# Patient Record
Sex: Male | Born: 1935 | Race: White | Hispanic: No | Marital: Married | Smoking: Former smoker
Health system: Southern US, Community
[De-identification: ages and names within clinical notes are randomized; demographics above are authoritative.]

## PROBLEM LIST (undated history)

## (undated) DIAGNOSIS — T4145XA Adverse effect of unspecified anesthetic, initial encounter: Secondary | ICD-10-CM

## (undated) DIAGNOSIS — I491 Atrial premature depolarization: Secondary | ICD-10-CM

## (undated) DIAGNOSIS — I10 Essential (primary) hypertension: Secondary | ICD-10-CM

## (undated) DIAGNOSIS — M1990 Unspecified osteoarthritis, unspecified site: Secondary | ICD-10-CM

## (undated) DIAGNOSIS — Z87442 Personal history of urinary calculi: Secondary | ICD-10-CM

## (undated) DIAGNOSIS — M069 Rheumatoid arthritis, unspecified: Secondary | ICD-10-CM

## (undated) DIAGNOSIS — T8859XA Other complications of anesthesia, initial encounter: Secondary | ICD-10-CM

## (undated) DIAGNOSIS — E785 Hyperlipidemia, unspecified: Secondary | ICD-10-CM

## (undated) DIAGNOSIS — K5730 Diverticulosis of large intestine without perforation or abscess without bleeding: Secondary | ICD-10-CM

## (undated) DIAGNOSIS — G4733 Obstructive sleep apnea (adult) (pediatric): Secondary | ICD-10-CM

## (undated) DIAGNOSIS — Z9989 Dependence on other enabling machines and devices: Secondary | ICD-10-CM

## (undated) DIAGNOSIS — Z973 Presence of spectacles and contact lenses: Secondary | ICD-10-CM

## (undated) DIAGNOSIS — Z974 Presence of external hearing-aid: Secondary | ICD-10-CM

## (undated) DIAGNOSIS — Z9889 Other specified postprocedural states: Secondary | ICD-10-CM

## (undated) DIAGNOSIS — I480 Paroxysmal atrial fibrillation: Secondary | ICD-10-CM

## (undated) DIAGNOSIS — Z860100 Personal history of colon polyps, unspecified: Secondary | ICD-10-CM

## (undated) DIAGNOSIS — N1330 Unspecified hydronephrosis: Secondary | ICD-10-CM

## (undated) DIAGNOSIS — I2510 Atherosclerotic heart disease of native coronary artery without angina pectoris: Secondary | ICD-10-CM

## (undated) DIAGNOSIS — J329 Chronic sinusitis, unspecified: Secondary | ICD-10-CM

## (undated) DIAGNOSIS — Z872 Personal history of diseases of the skin and subcutaneous tissue: Secondary | ICD-10-CM

## (undated) DIAGNOSIS — K219 Gastro-esophageal reflux disease without esophagitis: Secondary | ICD-10-CM

## (undated) DIAGNOSIS — Z859 Personal history of malignant neoplasm, unspecified: Secondary | ICD-10-CM

## (undated) HISTORY — DX: Hyperlipidemia, unspecified: E78.5

## (undated) HISTORY — PX: TONSILLECTOMY: SUR1361

## (undated) HISTORY — PX: COLONOSCOPY: SHX174

## (undated) HISTORY — PX: CORONARY ANGIOPLASTY: SHX604

## (undated) HISTORY — DX: Essential (primary) hypertension: I10

## (undated) HISTORY — DX: Unspecified osteoarthritis, unspecified site: M19.90

## (undated) HISTORY — DX: Atrial premature depolarization: I49.1

## (undated) HISTORY — PX: MOHS SURGERY: SUR867

## (undated) HISTORY — DX: Gastro-esophageal reflux disease without esophagitis: K21.9

## (undated) HISTORY — DX: Rheumatoid arthritis, unspecified: M06.9

## (undated) HISTORY — PX: BACK SURGERY: SHX140

## (undated) HISTORY — DX: Chronic sinusitis, unspecified: J32.9

## (undated) HISTORY — DX: Paroxysmal atrial fibrillation: I48.0

---

## 2003-02-16 ENCOUNTER — Encounter: Payer: Self-pay | Admitting: Internal Medicine

## 2003-02-16 ENCOUNTER — Encounter: Admission: RE | Admit: 2003-02-16 | Discharge: 2003-02-16 | Payer: Self-pay | Admitting: Internal Medicine

## 2003-03-25 ENCOUNTER — Encounter: Payer: Self-pay | Admitting: Neurosurgery

## 2003-03-25 ENCOUNTER — Encounter: Admission: RE | Admit: 2003-03-25 | Discharge: 2003-03-25 | Payer: Self-pay | Admitting: Neurosurgery

## 2003-03-25 ENCOUNTER — Encounter: Payer: Self-pay | Admitting: Diagnostic Radiology

## 2003-04-08 ENCOUNTER — Encounter: Payer: Self-pay | Admitting: Neurosurgery

## 2003-04-08 ENCOUNTER — Encounter: Admission: RE | Admit: 2003-04-08 | Discharge: 2003-04-08 | Payer: Self-pay | Admitting: Neurosurgery

## 2003-06-05 ENCOUNTER — Encounter: Payer: Self-pay | Admitting: Neurosurgery

## 2003-06-05 ENCOUNTER — Encounter: Admission: RE | Admit: 2003-06-05 | Discharge: 2003-06-05 | Payer: Self-pay | Admitting: Neurosurgery

## 2003-08-28 ENCOUNTER — Encounter: Payer: Self-pay | Admitting: Neurosurgery

## 2003-08-28 ENCOUNTER — Ambulatory Visit (HOSPITAL_COMMUNITY): Admission: RE | Admit: 2003-08-28 | Discharge: 2003-08-28 | Payer: Self-pay | Admitting: Neurosurgery

## 2003-10-01 ENCOUNTER — Inpatient Hospital Stay (HOSPITAL_COMMUNITY): Admission: RE | Admit: 2003-10-01 | Discharge: 2003-10-05 | Payer: Self-pay | Admitting: Neurosurgery

## 2003-10-01 HISTORY — PX: POSTERIOR LUMBAR FUSION: SHX6036

## 2003-12-31 ENCOUNTER — Ambulatory Visit (HOSPITAL_COMMUNITY): Admission: RE | Admit: 2003-12-31 | Discharge: 2003-12-31 | Payer: Self-pay | Admitting: Orthopedic Surgery

## 2003-12-31 ENCOUNTER — Ambulatory Visit (HOSPITAL_BASED_OUTPATIENT_CLINIC_OR_DEPARTMENT_OTHER): Admission: RE | Admit: 2003-12-31 | Discharge: 2003-12-31 | Payer: Self-pay | Admitting: Orthopedic Surgery

## 2003-12-31 ENCOUNTER — Encounter (INDEPENDENT_AMBULATORY_CARE_PROVIDER_SITE_OTHER): Payer: Self-pay | Admitting: *Deleted

## 2004-09-22 ENCOUNTER — Ambulatory Visit: Payer: Self-pay | Admitting: Gastroenterology

## 2004-10-01 ENCOUNTER — Ambulatory Visit: Payer: Self-pay | Admitting: Internal Medicine

## 2004-10-02 ENCOUNTER — Ambulatory Visit: Payer: Self-pay | Admitting: Gastroenterology

## 2004-10-05 ENCOUNTER — Ambulatory Visit: Payer: Self-pay | Admitting: Gastroenterology

## 2004-10-27 ENCOUNTER — Ambulatory Visit: Payer: Self-pay | Admitting: Internal Medicine

## 2005-02-01 ENCOUNTER — Ambulatory Visit: Payer: Self-pay | Admitting: Internal Medicine

## 2005-09-16 ENCOUNTER — Ambulatory Visit: Payer: Self-pay | Admitting: Internal Medicine

## 2005-10-14 ENCOUNTER — Ambulatory Visit: Payer: Self-pay | Admitting: Internal Medicine

## 2005-11-12 ENCOUNTER — Ambulatory Visit: Payer: Self-pay | Admitting: Internal Medicine

## 2005-11-18 ENCOUNTER — Ambulatory Visit: Payer: Self-pay | Admitting: Internal Medicine

## 2006-02-22 ENCOUNTER — Ambulatory Visit: Payer: Self-pay | Admitting: Internal Medicine

## 2006-02-23 ENCOUNTER — Ambulatory Visit: Payer: Self-pay | Admitting: Gastroenterology

## 2006-03-29 ENCOUNTER — Ambulatory Visit: Payer: Self-pay | Admitting: Internal Medicine

## 2006-10-11 ENCOUNTER — Ambulatory Visit: Payer: Self-pay | Admitting: Internal Medicine

## 2006-11-22 ENCOUNTER — Ambulatory Visit: Payer: Self-pay | Admitting: Internal Medicine

## 2007-01-17 ENCOUNTER — Ambulatory Visit: Payer: Self-pay | Admitting: Endocrinology

## 2007-05-22 ENCOUNTER — Ambulatory Visit: Payer: Self-pay | Admitting: Internal Medicine

## 2007-05-22 LAB — CONVERTED CEMR LAB
ALT: 21 units/L (ref 0–53)
AST: 28 units/L (ref 0–37)
Albumin: 3.7 g/dL (ref 3.5–5.2)
Alkaline Phosphatase: 50 units/L (ref 39–117)
BUN: 13 mg/dL (ref 6–23)
Basophils Absolute: 0 10*3/uL (ref 0.0–0.1)
Basophils Relative: 0.5 % (ref 0.0–1.0)
Bilirubin Urine: NEGATIVE
Bilirubin, Direct: 0.1 mg/dL (ref 0.0–0.3)
CO2: 31 meq/L (ref 19–32)
Calcium: 8.9 mg/dL (ref 8.4–10.5)
Chloride: 114 meq/L — ABNORMAL HIGH (ref 96–112)
Cholesterol: 250 mg/dL (ref 0–200)
Creatinine, Ser: 0.8 mg/dL (ref 0.4–1.5)
Direct LDL: 163 mg/dL
Eosinophils Absolute: 0.1 10*3/uL (ref 0.0–0.6)
Eosinophils Relative: 1.6 % (ref 0.0–5.0)
GFR calc Af Amer: 123 mL/min
GFR calc non Af Amer: 101 mL/min
Glucose, Bld: 108 mg/dL — ABNORMAL HIGH (ref 70–99)
HCT: 40 % (ref 39.0–52.0)
HDL: 46.1 mg/dL (ref >39.0)
Hemoglobin, Urine: NEGATIVE
Hemoglobin: 13.7 g/dL (ref 13.0–17.0)
Ketones, ur: NEGATIVE mg/dL
Leukocytes, UA: NEGATIVE
Lymphocytes Relative: 40.1 % (ref 12.0–46.0)
MCHC: 34.2 g/dL (ref 30.0–36.0)
MCV: 97.4 fL (ref 78.0–100.0)
Monocytes Absolute: 0.4 10*3/uL (ref 0.2–0.7)
Monocytes Relative: 10.7 % (ref 3.0–11.0)
Neutro Abs: 1.8 10*3/uL (ref 1.4–7.7)
Neutrophils Relative %: 47.1 % (ref 43.0–77.0)
Nitrite: NEGATIVE
PSA: 1.07 ng/mL (ref 0.10–4.00)
Platelets: 307 10*3/uL (ref 150–400)
Potassium: 3.9 meq/L (ref 3.5–5.1)
RBC: 4.1 M/uL — ABNORMAL LOW (ref 4.22–5.81)
RDW: 12.1 % (ref 11.5–14.6)
Sodium: 146 meq/L — ABNORMAL HIGH (ref 135–145)
Specific Gravity, Urine: 1.025 (ref 1.000–1.03)
TSH: 0.72 microintl units/mL (ref 0.35–5.50)
Total Bilirubin: 0.9 mg/dL (ref 0.3–1.2)
Total CHOL/HDL Ratio: 5.4
Total Protein, Urine: NEGATIVE mg/dL
Total Protein: 6.8 g/dL (ref 6.0–8.3)
Triglycerides: 145 mg/dL (ref 0–149)
Urine Glucose: NEGATIVE mg/dL
Urobilinogen, UA: 0.2 (ref 0.0–1.0)
VLDL: 29 mg/dL (ref 0–40)
WBC: 3.8 10*3/uL — ABNORMAL LOW (ref 4.5–10.5)
pH: 6 (ref 5.0–8.0)

## 2007-05-26 ENCOUNTER — Ambulatory Visit: Payer: Self-pay | Admitting: Internal Medicine

## 2007-07-27 ENCOUNTER — Encounter: Payer: Self-pay | Admitting: Endocrinology

## 2007-07-27 DIAGNOSIS — Z860100 Personal history of colon polyps, unspecified: Secondary | ICD-10-CM | POA: Insufficient documentation

## 2007-07-27 DIAGNOSIS — M545 Low back pain, unspecified: Secondary | ICD-10-CM | POA: Insufficient documentation

## 2007-08-28 ENCOUNTER — Ambulatory Visit: Payer: Self-pay | Admitting: Internal Medicine

## 2007-09-04 ENCOUNTER — Ambulatory Visit: Payer: Self-pay | Admitting: Internal Medicine

## 2007-09-19 ENCOUNTER — Ambulatory Visit: Payer: Self-pay | Admitting: Internal Medicine

## 2007-09-20 LAB — CONVERTED CEMR LAB
ALT: 24 units/L (ref 0–53)
AST: 25 units/L (ref 0–37)
Cholesterol: 268 mg/dL (ref 0–200)
Direct LDL: 181.8 mg/dL
HDL: 64.4 mg/dL (ref >39.0)
Total CHOL/HDL Ratio: 4.2
Triglycerides: 95 mg/dL (ref 0–149)
VLDL: 19 mg/dL (ref 0–40)

## 2007-09-21 ENCOUNTER — Ambulatory Visit: Payer: Self-pay | Admitting: Internal Medicine

## 2007-09-21 LAB — CONVERTED CEMR LAB: Vit D, 1,25-Dihydroxy: 39 (ref 30–89)

## 2007-10-05 ENCOUNTER — Ambulatory Visit: Payer: Self-pay | Admitting: Internal Medicine

## 2007-10-05 ENCOUNTER — Encounter: Payer: Self-pay | Admitting: Internal Medicine

## 2007-10-23 ENCOUNTER — Encounter: Payer: Self-pay | Admitting: Internal Medicine

## 2007-10-31 ENCOUNTER — Ambulatory Visit: Payer: Self-pay | Admitting: Internal Medicine

## 2008-02-02 ENCOUNTER — Ambulatory Visit: Payer: Self-pay | Admitting: Internal Medicine

## 2008-02-04 LAB — CONVERTED CEMR LAB
ALT: 23 units/L (ref 0–53)
AST: 26 units/L (ref 0–37)
Albumin: 3.8 g/dL (ref 3.5–5.2)
Alkaline Phosphatase: 53 units/L (ref 39–117)
BUN: 14 mg/dL (ref 6–23)
Bilirubin, Direct: 0.1 mg/dL (ref 0.0–0.3)
CO2: 31 meq/L (ref 19–32)
Calcium: 9.3 mg/dL (ref 8.4–10.5)
Chloride: 106 meq/L (ref 96–112)
Cholesterol: 227 mg/dL (ref 0–200)
Creatinine, Ser: 0.8 mg/dL (ref 0.4–1.5)
Direct LDL: 152 mg/dL
GFR calc Af Amer: 122 mL/min
GFR calc non Af Amer: 101 mL/min
Glucose, Bld: 92 mg/dL (ref 70–99)
HDL: 56.9 mg/dL (ref >39.0)
Potassium: 4 meq/L (ref 3.5–5.1)
Sodium: 141 meq/L (ref 135–145)
Total Bilirubin: 0.8 mg/dL (ref 0.3–1.2)
Total CHOL/HDL Ratio: 4
Total Protein: 6.9 g/dL (ref 6.0–8.3)
Triglycerides: 108 mg/dL (ref 0–149)
VLDL: 22 mg/dL (ref 0–40)

## 2008-02-06 ENCOUNTER — Ambulatory Visit: Payer: Self-pay | Admitting: Internal Medicine

## 2008-03-15 ENCOUNTER — Encounter: Payer: Self-pay | Admitting: Internal Medicine

## 2008-05-22 ENCOUNTER — Telehealth: Payer: Self-pay | Admitting: Internal Medicine

## 2008-05-23 ENCOUNTER — Encounter (INDEPENDENT_AMBULATORY_CARE_PROVIDER_SITE_OTHER): Payer: Self-pay | Admitting: *Deleted

## 2008-05-30 ENCOUNTER — Ambulatory Visit: Payer: Self-pay | Admitting: Internal Medicine

## 2008-05-30 LAB — CONVERTED CEMR LAB
ALT: 24 units/L (ref 0–53)
AST: 28 units/L (ref 0–37)
Albumin: 3.8 g/dL (ref 3.5–5.2)
Alkaline Phosphatase: 56 units/L (ref 39–117)
Bilirubin, Direct: 0.1 mg/dL (ref 0.0–0.3)
Cholesterol: 205 mg/dL (ref 0–200)
Direct LDL: 130.6 mg/dL
HDL: 43.8 mg/dL (ref >39.0)
Total Bilirubin: 0.9 mg/dL (ref 0.3–1.2)
Total CHOL/HDL Ratio: 4.7
Total Protein: 7 g/dL (ref 6.0–8.3)
Triglycerides: 132 mg/dL (ref 0–149)
VLDL: 26 mg/dL (ref 0–40)

## 2008-06-11 ENCOUNTER — Ambulatory Visit: Payer: Self-pay | Admitting: Internal Medicine

## 2008-06-11 DIAGNOSIS — R5381 Other malaise: Secondary | ICD-10-CM | POA: Insufficient documentation

## 2008-06-11 DIAGNOSIS — M1990 Unspecified osteoarthritis, unspecified site: Secondary | ICD-10-CM | POA: Insufficient documentation

## 2008-06-11 DIAGNOSIS — J309 Allergic rhinitis, unspecified: Secondary | ICD-10-CM | POA: Insufficient documentation

## 2008-06-11 DIAGNOSIS — R5383 Other fatigue: Secondary | ICD-10-CM | POA: Insufficient documentation

## 2008-06-14 ENCOUNTER — Ambulatory Visit: Payer: Self-pay | Admitting: Cardiology

## 2008-06-14 ENCOUNTER — Telehealth: Payer: Self-pay | Admitting: Internal Medicine

## 2008-06-14 LAB — CONVERTED CEMR LAB
BUN: 17 mg/dL (ref 6–23)
Basophils Absolute: 0 10*3/uL (ref 0.0–0.1)
Basophils Relative: 0.2 % (ref 0.0–3.0)
CO2: 28 meq/L (ref 19–32)
Calcium: 9.5 mg/dL (ref 8.4–10.5)
Chloride: 105 meq/L (ref 96–112)
Creatinine, Ser: 0.8 mg/dL (ref 0.4–1.5)
Eosinophils Absolute: 0.1 10*3/uL (ref 0.0–0.7)
Eosinophils Relative: 1.3 % (ref 0.0–5.0)
GFR calc Af Amer: 122 mL/min
GFR calc non Af Amer: 101 mL/min
Glucose, Bld: 99 mg/dL (ref 70–99)
HCT: 40.7 % (ref 39.0–52.0)
Hemoglobin: 14.1 g/dL (ref 13.0–17.0)
Lymphocytes Relative: 39.4 % (ref 12.0–46.0)
MCHC: 34.7 g/dL (ref 30.0–36.0)
MCV: 98.6 fL (ref 78.0–100.0)
Monocytes Absolute: 0.5 10*3/uL (ref 0.1–1.0)
Monocytes Relative: 10.4 % (ref 3.0–12.0)
Neutro Abs: 2.1 10*3/uL (ref 1.4–7.7)
Neutrophils Relative %: 48.7 % (ref 43.0–77.0)
Platelets: 285 10*3/uL (ref 150–400)
Potassium: 4 meq/L (ref 3.5–5.1)
RBC: 4.13 M/uL — ABNORMAL LOW (ref 4.22–5.81)
RDW: 12.5 % (ref 11.5–14.6)
Sodium: 139 meq/L (ref 135–145)
TSH: 0.6 microintl units/mL (ref 0.35–5.50)
WBC: 4.4 10*3/uL — ABNORMAL LOW (ref 4.5–10.5)

## 2008-06-25 ENCOUNTER — Encounter: Payer: Self-pay | Admitting: Cardiology

## 2008-06-25 ENCOUNTER — Ambulatory Visit: Payer: Self-pay

## 2008-07-09 ENCOUNTER — Ambulatory Visit: Payer: Self-pay | Admitting: Cardiology

## 2008-07-09 LAB — CONVERTED CEMR LAB
CK-MB: 2.5 ng/mL (ref 0.3–4.0)
Total CK: 105 units/L (ref 7–195)

## 2008-10-14 ENCOUNTER — Ambulatory Visit: Payer: Self-pay | Admitting: Internal Medicine

## 2008-10-15 ENCOUNTER — Ambulatory Visit: Payer: Self-pay | Admitting: Internal Medicine

## 2008-10-18 LAB — CONVERTED CEMR LAB
BUN: 17 mg/dL (ref 6–23)
Basophils Absolute: 0 10*3/uL (ref 0.0–0.1)
Basophils Relative: 0.1 % (ref 0.0–3.0)
CO2: 30 meq/L (ref 19–32)
Calcium: 9.3 mg/dL (ref 8.4–10.5)
Chloride: 108 meq/L (ref 96–112)
Cholesterol: 199 mg/dL (ref 0–200)
Creatinine, Ser: 0.8 mg/dL (ref 0.4–1.5)
Eosinophils Absolute: 0.1 10*3/uL (ref 0.0–0.7)
Eosinophils Relative: 1.7 % (ref 0.0–5.0)
GFR calc Af Amer: 122 mL/min
GFR calc non Af Amer: 101 mL/min
Glucose, Bld: 101 mg/dL — ABNORMAL HIGH (ref 70–99)
HCT: 39.6 % (ref 39.0–52.0)
HDL: 44 mg/dL (ref >39.0)
Hemoglobin: 13.7 g/dL (ref 13.0–17.0)
LDL Cholesterol: 133 mg/dL — ABNORMAL HIGH (ref 0–99)
Lymphocytes Relative: 48.2 % — ABNORMAL HIGH (ref 12.0–46.0)
MCHC: 34.6 g/dL (ref 30.0–36.0)
MCV: 97 fL (ref 78.0–100.0)
Monocytes Absolute: 0.4 10*3/uL (ref 0.1–1.0)
Monocytes Relative: 9.5 % (ref 3.0–12.0)
Neutro Abs: 1.6 10*3/uL (ref 1.4–7.7)
Neutrophils Relative %: 40.5 % — ABNORMAL LOW (ref 43.0–77.0)
Platelets: 269 10*3/uL (ref 150–400)
Potassium: 3.9 meq/L (ref 3.5–5.1)
RBC: 4.08 M/uL — ABNORMAL LOW (ref 4.22–5.81)
RDW: 12 % (ref 11.5–14.6)
Sodium: 144 meq/L (ref 135–145)
TSH: 0.62 microintl units/mL (ref 0.35–5.50)
Total CHOL/HDL Ratio: 4.5
Triglycerides: 109 mg/dL (ref 0–149)
VLDL: 22 mg/dL (ref 0–40)
WBC: 3.9 10*3/uL — ABNORMAL LOW (ref 4.5–10.5)

## 2009-05-12 ENCOUNTER — Telehealth: Payer: Self-pay | Admitting: Internal Medicine

## 2009-05-12 ENCOUNTER — Encounter: Payer: Self-pay | Admitting: Internal Medicine

## 2009-05-13 ENCOUNTER — Ambulatory Visit: Payer: Self-pay | Admitting: Internal Medicine

## 2009-05-13 LAB — CONVERTED CEMR LAB
ALT: 25 units/L (ref 0–53)
AST: 29 units/L (ref 0–37)
Albumin: 4 g/dL (ref 3.5–5.2)
Alkaline Phosphatase: 52 units/L (ref 39–117)
BUN: 16 mg/dL (ref 6–23)
Basophils Absolute: 0 10*3/uL (ref 0.0–0.1)
Basophils Relative: 0.5 % (ref 0.0–3.0)
Bilirubin Urine: NEGATIVE
Bilirubin, Direct: 0.2 mg/dL (ref 0.0–0.3)
CO2: 30 meq/L (ref 19–32)
Calcium: 9.4 mg/dL (ref 8.4–10.5)
Chloride: 107 meq/L (ref 96–112)
Cholesterol: 180 mg/dL (ref 0–200)
Creatinine, Ser: 0.8 mg/dL (ref 0.4–1.5)
Eosinophils Absolute: 0 10*3/uL (ref 0.0–0.7)
Eosinophils Relative: 1 % (ref 0.0–5.0)
GFR calc non Af Amer: 100.51 mL/min (ref >60)
Glucose, Bld: 104 mg/dL — ABNORMAL HIGH (ref 70–99)
HCT: 39.9 % (ref 39.0–52.0)
HDL: 52.1 mg/dL (ref >39.00)
Hemoglobin, Urine: NEGATIVE
Hemoglobin: 13.7 g/dL (ref 13.0–17.0)
Ketones, ur: NEGATIVE mg/dL
LDL Cholesterol: 112 mg/dL — ABNORMAL HIGH (ref 0–99)
Leukocytes, UA: NEGATIVE
Lymphocytes Relative: 42.6 % (ref 12.0–46.0)
Lymphs Abs: 1.7 10*3/uL (ref 0.7–4.0)
MCHC: 34.3 g/dL (ref 30.0–36.0)
MCV: 97.5 fL (ref 78.0–100.0)
Monocytes Absolute: 0.4 10*3/uL (ref 0.1–1.0)
Monocytes Relative: 9.4 % (ref 3.0–12.0)
Neutro Abs: 2 10*3/uL (ref 1.4–7.7)
Neutrophils Relative %: 46.5 % (ref 43.0–77.0)
Nitrite: NEGATIVE
PSA: 1.14 ng/mL (ref 0.10–4.00)
Platelets: 259 10*3/uL (ref 150.0–400.0)
Potassium: 4.1 meq/L (ref 3.5–5.1)
RBC: 4.09 M/uL — ABNORMAL LOW (ref 4.22–5.81)
RDW: 12.4 % (ref 11.5–14.6)
Sodium: 142 meq/L (ref 135–145)
Specific Gravity, Urine: 1.02 (ref 1.000–1.030)
TSH: 0.77 microintl units/mL (ref 0.35–5.50)
Total Bilirubin: 1.1 mg/dL (ref 0.3–1.2)
Total CHOL/HDL Ratio: 3
Total Protein, Urine: NEGATIVE mg/dL
Total Protein: 7.2 g/dL (ref 6.0–8.3)
Triglycerides: 79 mg/dL (ref 0.0–149.0)
Urine Glucose: NEGATIVE mg/dL
Urobilinogen, UA: 0.2 (ref 0.0–1.0)
VLDL: 15.8 mg/dL (ref 0.0–40.0)
WBC: 4.1 10*3/uL — ABNORMAL LOW (ref 4.5–10.5)
pH: 6 (ref 5.0–8.0)

## 2009-08-21 ENCOUNTER — Ambulatory Visit: Payer: Self-pay | Admitting: Internal Medicine

## 2009-08-21 DIAGNOSIS — Z87891 Personal history of nicotine dependence: Secondary | ICD-10-CM | POA: Insufficient documentation

## 2009-08-21 DIAGNOSIS — IMO0001 Reserved for inherently not codable concepts without codable children: Secondary | ICD-10-CM | POA: Insufficient documentation

## 2009-09-25 ENCOUNTER — Encounter: Admission: RE | Admit: 2009-09-25 | Discharge: 2009-09-25 | Payer: Self-pay | Admitting: Orthopedic Surgery

## 2009-09-30 ENCOUNTER — Ambulatory Visit (HOSPITAL_BASED_OUTPATIENT_CLINIC_OR_DEPARTMENT_OTHER): Admission: RE | Admit: 2009-09-30 | Discharge: 2009-09-30 | Payer: Self-pay | Admitting: Orthopedic Surgery

## 2009-09-30 ENCOUNTER — Encounter (INDEPENDENT_AMBULATORY_CARE_PROVIDER_SITE_OTHER): Payer: Self-pay | Admitting: Orthopedic Surgery

## 2009-09-30 HISTORY — PX: DUPUYTREN CONTRACTURE RELEASE: SHX1478

## 2009-10-20 ENCOUNTER — Encounter: Payer: Self-pay | Admitting: Internal Medicine

## 2009-11-21 ENCOUNTER — Ambulatory Visit: Payer: Self-pay | Admitting: Internal Medicine

## 2009-11-21 LAB — CONVERTED CEMR LAB
ALT: 18 units/L (ref 0–53)
AST: 19 units/L (ref 0–37)
Albumin: 3.9 g/dL (ref 3.5–5.2)
Alkaline Phosphatase: 50 units/L (ref 39–117)
BUN: 20 mg/dL (ref 6–23)
Bilirubin, Direct: 0.1 mg/dL (ref 0.0–0.3)
CO2: 29 meq/L (ref 19–32)
Calcium: 9.2 mg/dL (ref 8.4–10.5)
Chloride: 109 meq/L (ref 96–112)
Cholesterol: 246 mg/dL — ABNORMAL HIGH (ref 0–200)
Creatinine, Ser: 0.9 mg/dL (ref 0.4–1.5)
Direct LDL: 166.5 mg/dL
GFR calc non Af Amer: 87.61 mL/min (ref >60)
Glucose, Bld: 92 mg/dL (ref 70–99)
HDL: 44.4 mg/dL (ref >39.00)
Potassium: 4.1 meq/L (ref 3.5–5.1)
Sodium: 143 meq/L (ref 135–145)
Total Bilirubin: 1.1 mg/dL (ref 0.3–1.2)
Total CHOL/HDL Ratio: 6
Total CK: 133 units/L (ref 7–232)
Total Protein: 7.3 g/dL (ref 6.0–8.3)
Triglycerides: 133 mg/dL (ref 0.0–149.0)
VLDL: 26.6 mg/dL (ref 0.0–40.0)

## 2009-11-27 ENCOUNTER — Ambulatory Visit: Payer: Self-pay | Admitting: Internal Medicine

## 2009-11-27 DIAGNOSIS — M2550 Pain in unspecified joint: Secondary | ICD-10-CM | POA: Insufficient documentation

## 2009-12-08 ENCOUNTER — Encounter: Payer: Self-pay | Admitting: Internal Medicine

## 2010-03-30 ENCOUNTER — Encounter: Payer: Self-pay | Admitting: Internal Medicine

## 2010-04-30 ENCOUNTER — Telehealth: Payer: Self-pay | Admitting: Internal Medicine

## 2010-05-08 ENCOUNTER — Encounter: Payer: Self-pay | Admitting: Internal Medicine

## 2010-05-25 ENCOUNTER — Encounter: Payer: Self-pay | Admitting: Internal Medicine

## 2010-06-04 ENCOUNTER — Ambulatory Visit: Payer: Self-pay | Admitting: Internal Medicine

## 2010-06-04 DIAGNOSIS — R209 Unspecified disturbances of skin sensation: Secondary | ICD-10-CM | POA: Insufficient documentation

## 2010-06-04 DIAGNOSIS — M069 Rheumatoid arthritis, unspecified: Secondary | ICD-10-CM | POA: Insufficient documentation

## 2010-06-23 ENCOUNTER — Encounter: Payer: Self-pay | Admitting: Internal Medicine

## 2010-07-05 ENCOUNTER — Ambulatory Visit: Payer: Self-pay | Admitting: Internal Medicine

## 2010-07-05 ENCOUNTER — Emergency Department (HOSPITAL_COMMUNITY): Admission: EM | Admit: 2010-07-05 | Discharge: 2010-07-05 | Payer: Self-pay | Admitting: Emergency Medicine

## 2010-07-07 ENCOUNTER — Telehealth: Payer: Self-pay | Admitting: Internal Medicine

## 2010-07-07 DIAGNOSIS — R002 Palpitations: Secondary | ICD-10-CM | POA: Insufficient documentation

## 2010-07-15 ENCOUNTER — Telehealth: Payer: Self-pay | Admitting: Internal Medicine

## 2010-07-16 ENCOUNTER — Ambulatory Visit: Payer: Self-pay

## 2010-07-16 ENCOUNTER — Ambulatory Visit: Payer: Self-pay | Admitting: Cardiology

## 2010-07-16 ENCOUNTER — Ambulatory Visit (HOSPITAL_COMMUNITY): Admission: RE | Admit: 2010-07-16 | Discharge: 2010-07-16 | Payer: Self-pay | Admitting: Internal Medicine

## 2010-07-16 ENCOUNTER — Encounter: Payer: Self-pay | Admitting: Internal Medicine

## 2010-07-16 ENCOUNTER — Ambulatory Visit: Payer: Self-pay | Admitting: Internal Medicine

## 2010-07-17 ENCOUNTER — Encounter: Payer: Self-pay | Admitting: Internal Medicine

## 2010-07-22 ENCOUNTER — Ambulatory Visit: Payer: Self-pay | Admitting: Internal Medicine

## 2010-07-23 ENCOUNTER — Ambulatory Visit: Payer: Self-pay | Admitting: Internal Medicine

## 2010-07-23 DIAGNOSIS — E782 Mixed hyperlipidemia: Secondary | ICD-10-CM | POA: Insufficient documentation

## 2010-08-24 ENCOUNTER — Encounter: Payer: Self-pay | Admitting: Internal Medicine

## 2010-09-22 ENCOUNTER — Ambulatory Visit: Payer: Self-pay | Admitting: Internal Medicine

## 2010-12-08 ENCOUNTER — Ambulatory Visit
Admission: RE | Admit: 2010-12-08 | Discharge: 2010-12-08 | Payer: Self-pay | Source: Home / Self Care | Attending: Internal Medicine | Admitting: Internal Medicine

## 2010-12-08 ENCOUNTER — Other Ambulatory Visit: Payer: Self-pay | Admitting: Internal Medicine

## 2010-12-08 LAB — CBC WITH DIFFERENTIAL/PLATELET
Basophils Absolute: 0 10*3/uL (ref 0.0–0.1)
Basophils Relative: 0.8 % (ref 0.0–3.0)
Eosinophils Absolute: 0.1 10*3/uL (ref 0.0–0.7)
Eosinophils Relative: 1.8 % (ref 0.0–5.0)
HCT: 41.4 % (ref 39.0–52.0)
Hemoglobin: 14.1 g/dL (ref 13.0–17.0)
Lymphocytes Relative: 44.8 % (ref 12.0–46.0)
Lymphs Abs: 1.9 10*3/uL (ref 0.7–4.0)
MCHC: 34 g/dL (ref 30.0–36.0)
MCV: 96.7 fl (ref 78.0–100.0)
Monocytes Absolute: 0.5 10*3/uL (ref 0.1–1.0)
Monocytes Relative: 11.1 % (ref 3.0–12.0)
Neutro Abs: 1.8 10*3/uL (ref 1.4–7.7)
Neutrophils Relative %: 41.5 % — ABNORMAL LOW (ref 43.0–77.0)
Platelets: 254 10*3/uL (ref 150.0–400.0)
RBC: 4.28 Mil/uL (ref 4.22–5.81)
RDW: 13.6 % (ref 11.5–14.6)
WBC: 4.3 10*3/uL — ABNORMAL LOW (ref 4.5–10.5)

## 2010-12-08 LAB — URINALYSIS
Bilirubin Urine: NEGATIVE
Ketones, ur: NEGATIVE
Leukocytes, UA: NEGATIVE
Nitrite: NEGATIVE
Specific Gravity, Urine: 1.025 (ref 1.000–1.030)
Total Protein, Urine: NEGATIVE
Urine Glucose: NEGATIVE
Urobilinogen, UA: 0.2 (ref 0.0–1.0)
pH: 5.5 (ref 5.0–8.0)

## 2010-12-08 LAB — LDL CHOLESTEROL, DIRECT: Direct LDL: 158.8 mg/dL

## 2010-12-08 LAB — HEPATIC FUNCTION PANEL
ALT: 15 U/L (ref 0–53)
AST: 18 U/L (ref 0–37)
Albumin: 3.7 g/dL (ref 3.5–5.2)
Alkaline Phosphatase: 51 U/L (ref 39–117)
Bilirubin, Direct: 0.1 mg/dL (ref 0.0–0.3)
Total Bilirubin: 0.7 mg/dL (ref 0.3–1.2)
Total Protein: 7 g/dL (ref 6.0–8.3)

## 2010-12-08 LAB — BASIC METABOLIC PANEL
BUN: 12 mg/dL (ref 6–23)
CO2: 31 mEq/L (ref 19–32)
Calcium: 9.1 mg/dL (ref 8.4–10.5)
Chloride: 108 mEq/L (ref 96–112)
Creatinine, Ser: 0.7 mg/dL (ref 0.4–1.5)
GFR: 109.5 mL/min (ref >60.00)
Glucose, Bld: 93 mg/dL (ref 70–99)
Potassium: 4.1 mEq/L (ref 3.5–5.1)
Sodium: 144 mEq/L (ref 135–145)

## 2010-12-08 LAB — LIPID PANEL
Cholesterol: 240 mg/dL — ABNORMAL HIGH (ref 0–200)
HDL: 62 mg/dL (ref >39.00)
Total CHOL/HDL Ratio: 4
Triglycerides: 122 mg/dL (ref 0.0–149.0)
VLDL: 24.4 mg/dL (ref 0.0–40.0)

## 2010-12-08 LAB — VITAMIN B12: Vitamin B-12: 727 pg/mL (ref 211–911)

## 2010-12-08 LAB — TSH: TSH: 0.54 u[IU]/mL (ref 0.35–5.50)

## 2010-12-08 LAB — HEMOGLOBIN A1C: Hgb A1c MFr Bld: 6.3 % (ref 4.6–6.5)

## 2010-12-13 LAB — CONVERTED CEMR LAB
ALT: 23 units/L (ref 0–53)
AST: 20 units/L (ref 0–37)
Albumin: 4 g/dL (ref 3.5–5.2)
Alkaline Phosphatase: 70 units/L (ref 39–117)
BUN: 20 mg/dL (ref 6–23)
Basophils Absolute: 0 10*3/uL (ref 0.0–0.1)
Basophils Relative: 0.3 % (ref 0.0–3.0)
Bilirubin Urine: NEGATIVE
Bilirubin, Direct: 0.2 mg/dL (ref 0.0–0.3)
CO2: 30 meq/L (ref 19–32)
Calcium: 9.7 mg/dL (ref 8.4–10.5)
Chloride: 109 meq/L (ref 96–112)
Cholesterol: 221 mg/dL — ABNORMAL HIGH (ref 0–200)
Creatinine, Ser: 0.8 mg/dL (ref 0.4–1.5)
Direct LDL: 148.2 mg/dL
Eosinophils Absolute: 0 10*3/uL (ref 0.0–0.7)
Eosinophils Relative: 0.2 % (ref 0.0–5.0)
GFR calc non Af Amer: 107.97 mL/min (ref >60)
Glucose, Bld: 123 mg/dL — ABNORMAL HIGH (ref 70–99)
HCT: 39.8 % (ref 39.0–52.0)
HDL: 54.8 mg/dL (ref >39.00)
Hemoglobin: 13.7 g/dL (ref 13.0–17.0)
Ketones, ur: NEGATIVE mg/dL
Leukocytes, UA: NEGATIVE
Lymphocytes Relative: 16 % (ref 12.0–46.0)
Lymphs Abs: 1.3 10*3/uL (ref 0.7–4.0)
MCHC: 34.3 g/dL (ref 30.0–36.0)
MCV: 94.1 fL (ref 78.0–100.0)
Monocytes Absolute: 0.4 10*3/uL (ref 0.1–1.0)
Monocytes Relative: 4.9 % (ref 3.0–12.0)
Neutro Abs: 6.5 10*3/uL (ref 1.4–7.7)
Neutrophils Relative %: 78.6 % — ABNORMAL HIGH (ref 43.0–77.0)
Nitrite: NEGATIVE
PSA: 1.84 ng/mL (ref 0.10–4.00)
Platelets: 356 10*3/uL (ref 150.0–400.0)
Potassium: 4.7 meq/L (ref 3.5–5.1)
RBC: 4.23 M/uL (ref 4.22–5.81)
RDW: 16.3 % — ABNORMAL HIGH (ref 11.5–14.6)
Sodium: 147 meq/L — ABNORMAL HIGH (ref 135–145)
Specific Gravity, Urine: >=1.03 (ref 1.000–1.030)
TSH: 0.34 microintl units/mL — ABNORMAL LOW (ref 0.35–5.50)
Total Bilirubin: 1 mg/dL (ref 0.3–1.2)
Total CHOL/HDL Ratio: 4
Total Protein, Urine: NEGATIVE mg/dL
Total Protein: 7.5 g/dL (ref 6.0–8.3)
Triglycerides: 77 mg/dL (ref 0.0–149.0)
Urine Glucose: NEGATIVE mg/dL
Urobilinogen, UA: 0.2 (ref 0.0–1.0)
VLDL: 15.4 mg/dL (ref 0.0–40.0)
Vitamin B-12: 407 pg/mL (ref 211–911)
WBC: 8.3 10*3/uL (ref 4.5–10.5)
pH: 5.5 (ref 5.0–8.0)

## 2010-12-17 NOTE — Assessment & Plan Note (Signed)
Summary: YEARLY--$50---STC   Vital Signs:  Patient profile:   77 year old male Height:      72 inches Weight:      178 pounds BMI:     24.23 Temp:     98 degrees F oral BP sitting:   124 / 76  (left arm)  Vitals Entered By: Tora Perches (May 13, 2009 10:57 AM) CC: CPX Is Patient Diabetic? No   CC:  CPX.  History of Present Illness: The patient presents for a wellness examination    Current Medications (verified): 1)  Lorazepam 0.5 Mg Tabs (Lorazepam) .[name].. 1 - 2 Two Times A Day Prn 2)  Adult Aspirin Low Strength 81 Mg  Tbdp (Aspirin) .... Take 1 By Mouth Qd 3)  Zolpidem Tartrate 10 Mg  Tabs (Zolpidem Tartrate) .... At Bedtime As Needed 4)  Omeprazole 20 Mg Cpdr (Omeprazole) .... One By Mouth Daily 5)  Darvocet-N 100 100-650 Mg  Tabs (Propoxyphene N-Apap) .... Take 1-2 Q 4 Hours Prn 6)  Fish Oil   Oil (Fish Oil) .... 2 By Mouth Bid 7)  Vitamin D 1000 Unit  Caps (Cholecalciferol) .... Take 1 By Mouth Qd 8)  Pravastatin Sodium 40 Mg  Tabs (Pravastatin Sodium) .... 2  By Mouth At Bedtime 9)  Aciphex 20 Mg Tbec (Rabeprazole Sodium) .[name].. 1 Tablet By Mouth Once Daily  Allergies: 1)  ! Lipitor 2)  ! * Crestor  Past History:  Past Surgical History: Last updated: 07/27/2007 Back Surgery (09/2003) EGD (10/05/2004)  Family History: Last updated: 10/31/2007 Family History Hypertension  Social History: Last updated: 10/31/2007 Retired Married  Past Medical History: Colonic polyps, hx of Hyperlipidemia Low back pain Minimal plaque in carotids Allergic rhinitis Osteoarthritis GI Dr Marina Goodell  Review of Systems  The patient denies fever, chest pain, and abdominal pain.    Physical Exam  General:  Well-developed,well-nourished,in no acute distress; alert,appropriate and cooperative throughout examination Eyes:  No corneal or conjunctival inflammation noted. EOMI. Perrla. Funduscopic exam benign, without hemorrhages, exudates or papilledema. Vision grossly  normal. Nose:  External nasal examination shows no deformity or inflammation. Nasal mucosa are pink and moist without lesions or exudates. Mouth:  Oral mucosa and oropharynx without lesions or exudates.  Teeth in good repair. Neck:  No deformities, masses, or tenderness noted. Lungs:  Normal respiratory effort, chest expands symmetrically. Lungs are clear to auscultation, no crackles or wheezes. Heart:  Normal rate and regular rhythm. S1 and S2 normal without gallop, murmur, click, rub or other extra sounds. Abdomen:  Bowel sounds positive,abdomen soft and non-tender without masses, organomegaly or hernias noted. Msk:  No deformity or scoliosis noted of thoracic or lumbar spine.  R Depuetren contracture Extremities:  No clubbing, cyanosis, edema, or deformity noted with normal full range of motion of all joints.   Neurologic:  No cranial nerve deficits noted. Station and gait are normal. Plantar reflexes are down-going bilaterally. DTRs are symmetrical throughout. Sensory, motor and coordinative functions appear intact. Skin:  Intact without suspicious lesions or rashes Psych:  Cognition and judgment appear intact. Alert and cooperative with normal attention span and concentration. No apparent delusions, illusions, hallucinations   Impression & Recommendations:  Problem # 1:  PHYSICAL EXAMINATION (ICD-V70.0) Assessment Comment Only  Orders: EKG w/ Interpretation (93000)  Problem # 2:  OSTEOARTHRITIS (ICD-715.90) Assessment: Unchanged  His updated medication list for this problem includes:    Adult Aspirin Low Strength 81 Mg Tbdp (Aspirin) .[name]... Take 1 by mouth qd    Darvocet-n  100 100-650 Mg Tabs (Propoxyphene n-apap) .[name]... Take 1-2 q 4 hours prn  Problem # 3:  LOW BACK PAIN (ICD-724.2) Assessment: Improved  His updated medication list for this problem includes:    Adult Aspirin Low Strength 81 Mg Tbdp (Aspirin) .[name]... Take 1 by mouth qd    Darvocet-n 100 100-650 Mg Tabs  (Propoxyphene n-apap) .[name]... Take 1-2 q 4 hours prn  Problem # 4:  ABNORMAL GLUCOSE NEC (ICD-790.29) Assessment: Comment Only  Problem # 5:  HYPERLIPIDEMIA (ICD-272.4) Assessment: Comment Only  His updated medication list for this problem includes:    Pravastatin Sodium 40 Mg Tabs (Pravastatin sodium) .[name]... 2  by mouth at bedtime  Complete Medication List: 1)  Lorazepam 0.5 Mg Tabs (Lorazepam) .[name].. 1 - 2 two times a day prn 2)  Adult Aspirin Low Strength 81 Mg Tbdp (Aspirin) .... Take 1 by mouth qd 3)  Zolpidem Tartrate 10 Mg Tabs (Zolpidem tartrate) .... At bedtime as needed 4)  Darvocet-n 100 100-650 Mg Tabs (Propoxyphene n-apap) .... Take 1-2 q 4 hours prn 5)  Fish Oil Oil (Fish oil) .... 2 by mouth bid 6)  Vitamin D 1000 Unit Caps (Cholecalciferol) .... Take 1 by mouth qd 7)  Pravastatin Sodium 40 Mg Tabs (Pravastatin sodium) .... 2  by mouth at bedtime 8)  Omeprazole 20 Mg Cpdr (Omeprazole) .... One by mouth daily 9)  Viagra 100 Mg Tabs (Sildenafil citrate) .[name].. 1 by mouth once daily prn  Patient Instructions: 1)  Please schedule a follow-up appointment in 6 months. 2)  Start a yoga class 3)  Try to eat more raw plant food, dry fruit, raw almonds, leafy vegies, whole foods and less meat, animal fat. Avoid processed foods, (canned soups, hot dogs, sausage , frozen dinners), animal fat, red meat.  4)  BMP prior to visit, ICD-9: 272.0 5)  Hepatic Panel prior to visit, ICD-9: 6)  Lipid Panel prior to visit, ICD-9: Prescriptions: VIAGRA 100 MG TABS (SILDENAFIL CITRATE) 1 by mouth once daily prn  #12 x 12   Entered and Authorized by:   Tresa Garter MD   Signed by:   Tresa Garter MD on 05/13/2009   Method used:   Print then Give to Patient   RxID:   [id number] OMEPRAZOLE 20 MG CPDR (OMEPRAZOLE) one by mouth daily  #90 x 3   Entered and Authorized by:   Tresa Garter MD   Signed by:   Tresa Garter MD on 05/13/2009   Method used:   Print then Give to  Patient   RxID:   [id number]

## 2010-12-17 NOTE — Progress Notes (Signed)
Summary: MED REFILL  Medications Added ACIPHEX 20 MG TBEC (RABEPRAZOLE SODIUM) 1 tablet by mouth once daily       Phone Note Call from Patient Call back at Work Phone [phone]   Caller: PT Call For: Lorraine Terriquez Reason for Call: Refill Medication Details for Reason: MED REFILL ACIPHEX Summary of Call: NEEDS 3 MO SUPPLY TO MAIL. Initial call taken by: Darryl Lent,  May 12, 2009 10:06 AM    New/Updated Medications: ACIPHEX 20 MG TBEC (RABEPRAZOLE SODIUM) 1 tablet by mouth once daily   Prescriptions: ACIPHEX 20 MG TBEC (RABEPRAZOLE SODIUM) 1 tablet by mouth once daily  #90 x 3   Entered by:   Milford Cage CMA   Authorized by:   Hilarie Fredrickson MD   Signed by:   Milford Cage CMA on 05/12/2009   Method used:   Print then Give to Patient   RxID:   [id number]   Appended Document: MED REFILL called patient to inform the Rx. for Aciphex is available to pick up at front desk.  BS.

## 2010-12-17 NOTE — Letter (Signed)
Summary: [facility]   Imported By: Lennie Odor 09/23/2010 10:57:42  _____________________________________________________________________  External Attachment:    Type:   Image     Comment:   External Document

## 2010-12-17 NOTE — Progress Notes (Signed)
Summary: REFERRAL  Phone Note Call from Patient Call back at Home Phone [phone]   Caller: wife [phone] Summary of Call: Pt saw Dr Corinda Gubler for allergies, Dr suggested that he see a cardiologist. He is req a referral  Wife called back, concerned and wants pt to see cardio asap ....................Marland KitchenLamar Todd  May 23, 2008 9:45 AM  Initial call taken by: [name],  May 22, 2008 5:35 PM  Follow-up for Phone Call        OK Follow-up by: Tresa Garter MD,  May 23, 2008 1:04 PM  Additional Follow-up for Phone Call Additional follow up Details #1::        Pt spoke with Dr Corinda Gubler, they made apt for cardiology for july, pt will cancel apt for august Additional Follow-up by: [name],  May 23, 2008 1:30 PM         Appended Document: REFERRAL Dr, Corinda Gubler has already call cardiology and sch pt to see Dr. Juanda Chance on 7/31

## 2010-12-17 NOTE — Progress Notes (Signed)
Summary: REFILL/Plot pt  Phone Note Refill Request   Refills Requested: Medication #1:  LORAZEPAM 0.5 MG TABS 1 - 2 two times a day prn Initial call taken by: Lamar Sprinkles,  June 14, 2008 7:55 AM  Follow-up for Phone Call        ok to refill lorazepam Follow-up by: Jacques Navy MD,  June 14, 2008 8:28 AM      Prescriptions: LORAZEPAM 0.5 MG TABS (LORAZEPAM) 1 - 2 two times a day prn  #60 x 2   Entered by:   Lamar Sprinkles   Authorized by:   Tresa Garter MD   Signed by:   Lamar Sprinkles on 06/14/2008   Method used:   Faxed to ...       [facility]       [address]       [place], [place]  [address]       Ph: [phone] or [phone]       Fax: [phone]   RxID:   [id number] LORAZEPAM 0.5 MG TABS (LORAZEPAM) 1 - 2 two times a day prn  #60 x 2   Entered and Authorized by:   Corwin Levins MD   Signed by:   Corwin Levins MD on 06/14/2008   Method used:   Print then Give to Patient   RxID:   [id number]

## 2010-12-17 NOTE — Procedures (Signed)
Summary: Summary Report  Summary Report   Imported By: Erle Crocker 07/29/2010 12:55:28  _____________________________________________________________________  External Attachment:    Type:   Image     Comment:   External Document

## 2010-12-17 NOTE — Assessment & Plan Note (Signed)
Summary: FU ON CHLORESTEROL/NWS  Medications Added PRAVACHOL 20 MG TABS (PRAVASTATIN SODIUM) 1 po qd FISH OIL   OIL (FISH OIL) 2 by mouth bid        Vital Signs:  Patient Profile:   77 Years Old Male Weight:      184 pounds Temp:     97.4 degrees F oral Pulse rate:   76 / minute BP sitting:   141 / 85  (left arm)  Vitals Entered By: Tora Perches (October 31, 2007 4:06 PM)             Is Patient Diabetic? No     Chief Complaint:  Multiple medical problems or concerns.  Current Allergies: ! LIPITOR ! * CRESTOR  Past Medical History:    Reviewed history from 07/27/2007 and no changes required:       Colonic polyps, hx of       Hyperlipidemia       Low back pain   Family History:    Family History Hypertension  Social History:    Retired    Married   Risk Factors:  Tobacco use:  quit   Review of Systems  The patient denies chest pain.     Physical Exam  General:     Well-developed,well-nourished,in no acute distress; alert,appropriate and cooperative throughout examination Lungs:     Normal respiratory effort, chest expands symmetrically. Lungs are clear to auscultation, no crackles or wheezes. Heart:     Normal rate and regular rhythm. S1 and S2 normal without gallop, murmur, click, rub or other extra sounds. Abdomen:     Bowel sounds positive,abdomen soft and non-tender without masses, organomegaly or hernias noted. Msk:     No deformity or scoliosis noted of thoracic or lumbar spine.   Neurologic:     alert & oriented X3.      Impression & Recommendations:  Problem # 1:  HYPERLIPIDEMIA (ICD-272.4)  The following medications were removed from the medication list:    Lovaza 1 Gm Caps (Omega-3-acid ethyl esters) .[name]... Take 2 by mouth two times a day qd  His updated medication list for this problem includes:    Pravachol 20 Mg Tabs (Pravastatin sodium) .[name]... 1 po once daily was started today. D/c if problems   The following medications  were removed from the medication list:    Lovaza 1 Gm Caps (Omega-3-acid ethyl esters) .[name]... Take 2 by mouth two times a day qd  His updated medication list for this problem includes:    Pravachol 20 Mg Tabs (Pravastatin sodium) .[name]... 1 po qd   Problem # 2:  LOW BACK PAIN (ICD-724.2) Assessment: Unchanged  His updated medication list for this problem includes:    Darvocet-n 100 100-650 Mg Tabs (Propoxyphene n-apap) .[name]... Take 1-2 q 4 hours prn    Adult Aspirin Low Strength 81 Mg Tbdp (Aspirin) .[name]... Take 1 by mouth qd   Complete Medication List: 1)  Pravachol 20 Mg Tabs (Pravastatin sodium) .[name].. 1 po qd 2)  Aciphex 20 Mg Tbec (Rabeprazole sodium) .... Take 1 by mouth qd 3)  Darvocet-n 100 100-650 Mg Tabs (Propoxyphene n-apap) .... Take 1-2 q 4 hours prn 4)  Lorazepam 0.5 Mg Tabs (Lorazepam) .[name].. 1 - 2 two times a day prn 5)  Fish Oil Oil (Fish oil) .... 2 by mouth bid 6)  Adult Aspirin Low Strength 81 Mg Tbdp (Aspirin) .... Take 1 by mouth qd 7)  Vitamin D 1000 Unit Caps (Cholecalciferol) .... Take 1  by mouth qd   Patient Instructions: 1)  Please schedule a follow-up appointment in 3 months. 2)  BMP prior to visit, ICD-9: 3)  Hepatic Panel prior to visit, ICD-9: 272.0 995.2 4)  Lipid Panel prior to visit, ICD-9:    Prescriptions: ACIPHEX 20 MG  TBEC (RABEPRAZOLE SODIUM) take 1 by mouth qd  #90 x 3   Entered and Authorized by:   Tresa Garter MD   Signed by:   Tresa Garter MD on 10/31/2007   Method used:   Print then Give to Patient   RxID:   [id number] PRAVACHOL 20 MG TABS (PRAVASTATIN SODIUM) 1 po qd  #30 x 12   Entered and Authorized by:   Tresa Garter MD   Signed by:   Tresa Garter MD on 10/31/2007   Method used:   Print then Give to Patient   RxID:   [id number]  ]

## 2010-12-17 NOTE — Progress Notes (Signed)
Summary: Refill--Zolpidem  Phone Note Refill Request Message from:  Fax from Pharmacy on April 30, 2010 2:26 PM  Refills Requested: Medication #1:  ZOLPIDEM TARTRATE 10 MG  TABS 1 at bedtime as needed Next Appointment Scheduled: 06-04-10 Initial call taken by: Lucious Groves,  April 30, 2010 2:27 PM  Follow-up for Phone Call        ok 6 ref Follow-up by: Tresa Garter MD,  May 01, 2010 8:05 AM    Prescriptions: ZOLPIDEM TARTRATE 10 MG  TABS (ZOLPIDEM TARTRATE) 1 at bedtime as needed  #90 x 1   Entered by:   Lamar Sprinkles, CMA   Authorized by:   Tresa Garter MD   Signed by:   Lamar Sprinkles, CMA on 05/01/2010   Method used:   Telephoned to ...       [facility] (retail)       [address]       [place], [place]  [address]       Ph: [phone] or [phone]       Fax: [phone]   RxID:   [id number]

## 2010-12-17 NOTE — Assessment & Plan Note (Signed)
Summary: 4 MO ROA/AWARE OF FEE/NML  APPT TIME IS 8:45/NWS   Vital Signs:  Patient Profile:   77 Years Old Male Weight:      182 pounds Temp:     97.2 degrees F oral Pulse rate:   80 / minute BP sitting:   139 / 94  (left arm)  Vitals Entered By: Tora Perches (June 11, 2008 11:41 AM)                 Chief Complaint:  Multiple medical problems or concerns.  History of Present Illness: The patient presents for a follow up of hypertension, hyperlipidemia.   C/o fatigue 2-3 mo. In the past he would see Dr Nira Retort and get a steroid shot for fatigue that he thought was due to  allergies and  feel better. Last time he saw him he didn't give him a shot and send him to see Dr Juanda Chance to check his heart. He felt SOB while hiking in [place].   No CP, no SOB here.     Current Allergies (reviewed today): ! LIPITOR ! * CRESTOR  Past Medical History:    Reviewed history from 07/27/2007 and no changes required:       Colonic polyps, hx of       Hyperlipidemia       Low back pain       Minimal plaque in carotids       Allergic rhinitis       Osteoarthritis  Past Surgical History:    Reviewed history from 07/27/2007 and no changes required:       Back Surgery (09/2003)       EGD (10/05/2004)   Family History:    Reviewed history from 10/31/2007 and no changes required:       Family History Hypertension  Social History:    Reviewed history from 10/31/2007 and no changes required:       Retired       Married     Review of Systems  The patient denies fever, weight gain, chest pain, syncope, dyspnea on exertion, peripheral edema, and abdominal pain.     Physical Exam  General:     Well-developed,well-nourished,in no acute distress; alert,appropriate and cooperative throughout examination Eyes:     No corneal or conjunctival inflammation noted. EOMI. Perrla. Funduscopic exam benign, without hemorrhages, exudates or papilledema. Vision grossly normal. Nose:     External  nasal examination shows no deformity or inflammation. Nasal mucosa are pink and moist without lesions or exudates. Mouth:     Oral mucosa and oropharynx without lesions or exudates.  Teeth in good repair. Neck:     No deformities, masses, or tenderness noted. Lungs:     Normal respiratory effort, chest expands symmetrically. Lungs are clear to auscultation, no crackles or wheezes. Heart:     Normal rate and regular rhythm. S1 and S2 normal without gallop, murmur, click, rub or other extra sounds. Abdomen:     Bowel sounds positive,abdomen soft and non-tender without masses, organomegaly or hernias noted. Msk:     No deformity or scoliosis noted of thoracic or lumbar spine.   Extremities:     No clubbing, cyanosis, edema, or deformity noted with normal full range of motion of all joints.   Neurologic:     No cranial nerve deficits noted. Station and gait are normal. Plantar reflexes are down-going bilaterally. DTRs are symmetrical throughout. Sensory, motor and coordinative functions appear intact. Skin:     Intact  without suspicious lesions or rashes Psych:     Cognition and judgment appear intact. Alert and cooperative with normal attention span and concentration. No apparent delusions, illusions, hallucinations    Impression & Recommendations:  Problem # 1:  FATIGUE (ICD-780.79) Assessment: Comment Only Recurrent, unclear etiology. The only new Rx is Pravachol. He can stop x 1-2 mo and see. He can try DHEA OTC  Problem # 2:  HYPERLIPIDEMIA (ICD-272.4) Assessment: Improved  His updated medication list for this problem includes:    Pravachol 20 Mg Tabs (Pravastatin sodium) .[name]... 2 po qd   Problem # 3:  LOW BACK PAIN (ICD-724.2) Assessment: Unchanged  His updated medication list for this problem includes:    Adult Aspirin Low Strength 81 Mg Tbdp (Aspirin) .[name]... Take 1 by mouth qd    Darvocet-n 100 100-650 Mg Tabs (Propoxyphene n-apap) .[name]... Take 1-2 q 4 hours prn   Problem  # 4:  ABNORMAL GLUCOSE NEC (ICD-790.29) Watching Labs Reviewed: Creat: 0.8 (02/02/2008)      Complete Medication List: 1)  Pravachol 20 Mg Tabs (Pravastatin sodium) .... 2 po qd 2)  Lorazepam 0.5 Mg Tabs (Lorazepam) .[name].. 1 - 2 two times a day prn 3)  Adult Aspirin Low Strength 81 Mg Tbdp (Aspirin) .... Take 1 by mouth qd 4)  Vitamin D 1000 Unit Caps (Cholecalciferol) .... Take 1 by mouth qd 5)  Zolpidem Tartrate 10 Mg Tabs (Zolpidem tartrate) .... At bedtime as needed 6)  Omeprazole 20 Mg Cpdr (Omeprazole) .... One by mouth daily 7)  Fish Oil Oil (Fish oil) .... 2 by mouth bid 8)  Darvocet-n 100 100-650 Mg Tabs (Propoxyphene n-apap) .... Take 1-2 q 4 hours prn   Patient Instructions: 1)  Can try DHEA 50 mg 1 a day 2)  Please schedule a follow-up appointment in 4 months.   Prescriptions: OMEPRAZOLE 20 MG CPDR (OMEPRAZOLE) one by mouth daily  #90 x 3   Entered and Authorized by:   Tresa Garter MD   Signed by:   Tresa Garter MD on 06/13/2008   Method used:   Electronically sent to ...       [facility]       [address]       [place], [place]  [address]       Ph: [phone] or [phone]       Fax: [phone]   RxID:   [id number]  ]

## 2010-12-17 NOTE — Assessment & Plan Note (Signed)
Summary: 4 MO ROV /NWS $50   Vital Signs:  Patient Profile:   77 Years Old Male Weight:      184 pounds Temp:     97.9 degrees F oral Pulse rate:   72 / minute BP sitting:   144 / 80  (left arm)  Vitals Entered By: Tora Perches (October 15, 2008 9:15 AM)                 Chief Complaint:  Multiple medical problems or concerns.    Current Allergies (reviewed today): ! LIPITOR ! * CRESTOR  Past Medical History:    Reviewed history from 06/11/2008 and no changes required:       Colonic polyps, hx of       Hyperlipidemia       Low back pain       Minimal plaque in carotids       Allergic rhinitis       Osteoarthritis   Family History:    Reviewed history from 10/31/2007 and no changes required:       Family History Hypertension  Social History:    Reviewed history from 10/31/2007 and no changes required:       Retired       Married     Physical Exam  General:     Well-developed,well-nourished,in no acute distress; alert,appropriate and cooperative throughout examination Ears:     External ear exam shows no significant lesions or deformities.  Otoscopic examination reveals clear canals, tympanic membranes are intact bilaterally without bulging, retraction, inflammation or discharge. Hearing is grossly normal bilaterally. Mouth:     Oral mucosa and oropharynx without lesions or exudates.  Teeth in good repair. Neck:     No deformities, masses, or tenderness noted. Lungs:     Normal respiratory effort, chest expands symmetrically. Lungs are clear to auscultation, no crackles or wheezes. Heart:     Normal rate and regular rhythm. S1 and S2 normal without gallop, murmur, click, rub or other extra sounds. Abdomen:     Bowel sounds positive,abdomen soft and non-tender without masses, organomegaly or hernias noted. Msk:     No deformity or scoliosis noted of thoracic or lumbar spine.      Impression & Recommendations:  Problem # 1:  HYPERLIPIDEMIA  (ICD-272.4) Assessment: Improved  His updated medication list for this problem includes:    Pravachol 20 Mg Tabs (Pravastatin sodium) .[name]... 2 po once daily seems to tol. it OK: increase to 80 mg qd  His updated medication list for this problem includes:    Pravastatin Sodium 40 Mg Tabs (Pravastatin sodium) .[name]... 2  by mouth at bedtime  The following medications were removed from the medication list:    Pravachol 20 Mg Tabs (Pravastatin sodium) .[name]... 2 po qd  His updated medication list for this problem includes:    Pravastatin Sodium 40 Mg Tabs (Pravastatin sodium) .[name]... 2  by mouth at bedtime   Problem # 2:  LOW BACK PAIN (ICD-724.2) Assessment: Unchanged  His updated medication list for this problem includes:    Adult Aspirin Low Strength 81 Mg Tbdp (Aspirin) .[name]... Take 1 by mouth qd    Darvocet-n 100 100-650 Mg Tabs (Propoxyphene n-apap) .[name]... Take 1-2 q 4 hours prn   Complete Medication List: 1)  Lorazepam 0.5 Mg Tabs (Lorazepam) .[name].. 1 - 2 two times a day prn 2)  Adult Aspirin Low Strength 81 Mg Tbdp (Aspirin) .... Take 1 by mouth qd 3)  Zolpidem Tartrate 10 Mg Tabs (Zolpidem tartrate) .... At bedtime as needed 4)  Omeprazole 20 Mg Cpdr (Omeprazole) .... One by mouth daily 5)  Darvocet-n 100 100-650 Mg Tabs (Propoxyphene n-apap) .... Take 1-2 q 4 hours prn 6)  Fish Oil Oil (Fish oil) .... 2 by mouth bid 7)  Vitamin D 1000 Unit Caps (Cholecalciferol) .... Take 1 by mouth qd 8)  Pravastatin Sodium 40 Mg Tabs (Pravastatin sodium) .... 2  by mouth at bedtime   Patient Instructions: 1)  Please schedule a follow-up appointment in 6 months well. 2)  BMP prior to visit, ICD-9: 3)  Hepatic Panel prior to visit, ICD-9: 4)  Lipid Panel prior to visit, ICD-9: 5)  TSH prior to visit, ICD-9: 6)  CBC w/ Diff prior to visit, ICD-9: v70.0  272.0  995.20 7)  Urine-dip prior to visit, ICD-9: 8)  PSA prior to visit, ICD-9:     Prescriptions: PRAVASTATIN SODIUM 40 MG  TABS  (PRAVASTATIN SODIUM) 2  by mouth at bedtime  #90 x 3   Entered and Authorized by:   Tresa Garter MD   Signed by:   Tresa Garter MD on 10/18/2008   Method used:   Print then Give to Patient   RxID:   [id number]  ]

## 2010-12-17 NOTE — Letter (Signed)
Summary: [facility] Consult Scheduled Letter  [facility]  [address] [place], [place] [address]   Phone: [phone]  Fax: [phone]      05/23/2008 MRN: [id number]  [facility] [address] [place], [place]  [address]    Dear [name],      We have scheduled an appointment for you.  At the recommendation of Dr. Posey Rea, we have scheduled you a consult with Dr. Daleen Squibb on June 26, 2008 at 3:00pm.  Their phone number is [phone].  If this appointment day and time is not convenient for you, please feel free to call the office of the doctor you are being referred to at the number listed above and reschedule the appointment.  Dr. [facility] [address] [place], [place] [address]  *Please give 24hr notice to cancel/reschedule your appointment to avoid a $50.00 fee*  Thank you,  Patient Care Coordinator [facility]  Appended Document: [facility] Consult Scheduled Letter cancel letter, Dr. Corinda Gubler called and sch appt for pt to see Dr. Juanda Chance on July 31

## 2010-12-17 NOTE — Progress Notes (Signed)
Summary: Lorazepam RF  Phone Note Refill Request Message from:  Fax from Pharmacy  Refills Requested: Medication #1:  LORAZEPAM 0.5 MG TABS 1 - 2 two times a day prn   Supply Requested: 60   Last Refilled: 02/19/2010  Method Requested: Telephone to Pharmacy Initial call taken by: Lanier Prude, [facility]),  July 15, 2010 1:21 PM  Follow-up for Phone Call        ok 3 ref Follow-up by: Tresa Garter MD,  July 15, 2010 1:30 PM  Additional Follow-up for Phone Call Additional follow up Details #1::        Rx called to pharmacy Additional Follow-up by: Lanier Prude, [facility]),  July 15, 2010 4:28 PM    Prescriptions: LORAZEPAM 0.5 MG TABS (LORAZEPAM) 1 - 2 two times a day prn  #60 x 3   Entered by:   Lanier Prude, CMA(AAMA)   Authorized by:   Tresa Garter MD   Signed by:   Lanier Prude, CMA(AAMA) on 07/15/2010   Method used:   Telephoned to ...       [facility] (retail)       [address]       [place], [place]  [id number]       Ph: [phone] or [phone]       Fax: [phone]   RxID:   [id number]

## 2010-12-17 NOTE — Assessment & Plan Note (Signed)
Summary: 6 MO ROV /NWS   Vital Signs:  Patient profile:   77 year old male Height:      72 inches Weight:      184 pounds BMI:     25.05 Temp:     98.5 degrees F oral Pulse rate:   80 / minute Pulse rhythm:   regular Resp:     16 per minute BP sitting:   140 / 86  (left arm) Cuff size:   regular  Vitals Entered By: Lanier Prude, CMA(AAMA) (December 08, 2010 7:53 AM) CC: 6 mo f/u  Is Patient Diabetic? No Comments pt is not taking Trexall or Folic acid   Primary Care Provider:  Tresa Garter MD  CC:  6 mo f/u .  History of Present Illness: The patient presents for a follow up of RA, GERD, hyperlipidemia   Current Medications (verified): 1)  Lorazepam 0.5 Mg Tabs (Lorazepam) .[name].. 1 - 2 Two Times A Day Prn 2)  Aspirin 325 Mg Tabs (Aspirin) .... Take 1 By Mouth Once Daily 3)  Zolpidem Tartrate 10 Mg  Tabs (Zolpidem Tartrate) .[name].. 1 At Bedtime As Needed 4)  Fish Oil   Oil (Fish Oil) .... 2 By Mouth Bid 5)  Vitamin D 1000 Unit  Caps (Cholecalciferol) .... Take 1 By Mouth Qd 6)  Omeprazole 20 Mg Cpdr (Omeprazole) .... One By Mouth Daily 7)  Viagra 100 Mg Tabs (Sildenafil Citrate) .[name].. 1 By Mouth Once Daily Prn 8)  Prednisone (Pak) 5 Mg Tabs (Prednisone) .[name].. 1 Once Daily 9)  Fexofenadine Hcl 180 Mg Tabs (Fexofenadine Hcl) .[name].. 1 Once Daily 10)  Trexall 15 Mg Tabs (Methotrexate Sodium) .... Weekly 11)  Folic Acid 1 Mg Tabs (Folic Acid) .... Once Daily 12)  Magnesium Oxide 400 Mg Tabs (Magnesium Oxide) .... 3 Daily 13)  Cyanocobalamin 2500 Mcg Subl (Cyanocobalamin) .... Once Daily 14)  Niaspan 500 Mg Cr-Tabs (Niacin (Antihyperlipidemic)) .... Take 1 Tablet At Bedtime X 4 Weeks Then Increase To 2 Tabs At Bedtime 15)  Red Yeast Rice 600 Mg Caps (Red Yeast Rice Extract) .[name].. 1 By Mouth Once Daily 16)  Arava 20 Mg Tabs (Leflunomide) .[name].. 1 By Mouth Once Daily  Allergies (verified): 1)  ! Lipitor 2)  ! * Crestor 3)  Pravachol (Pravastatin Sodium) 4)  Methotrexate  Past  History:  Past Surgical History: Last updated: 07/27/2007 Back Surgery (09/2003) EGD (10/05/2004)  Social History: Last updated: 10/31/2007 Retired Married  Past Medical History: Colonic polyps, hx of Hyperlipidemia Low back pain Minimal plaque in carotids Allergic rhinitis Osteoarthritis Dr Charlann Boxer GI Dr Marina Goodell Migraine w/aura Statin intolerance PACs RA -  Dr Dareen Piano 2011 [facility] 2011 - one episode due to ? MTX AKs Dr Londell Moh  Family History: Reviewed history from 10/31/2007 and no changes required. Family History Hypertension  Social History: Reviewed history from 10/31/2007 and no changes required. Retired Married  Review of Systems  The patient denies anorexia, dyspnea on exertion, abdominal pain, and depression.    Physical Exam  General:  Well-developed,well-nourished,in no acute distress; alert,appropriate and cooperative throughout examination Head:  Normocephalic and atraumatic without obvious abnormalities. No apparent alopecia or balding. Nose:  External nasal examination shows no deformity or inflammation. Nasal mucosa are pink and moist without lesions or exudates. Mouth:  Oral mucosa and oropharynx without lesions or exudates.  Teeth in good repair. Neck:  No deformities, masses, or tenderness noted. Lungs:  Normal respiratory effort, chest expands symmetrically. Lungs are clear to auscultation, no crackles  or wheezes. Heart:  Normal rate and regular rhythm. S1 and S2 normal without gallop, murmur, click, rub or other extra sounds. Abdomen:  Bowel sounds positive,abdomen soft and non-tender without masses, organomegaly or hernias noted. Msk:  L hip pain Extremities:  No clubbing, cyanosis, edema, or deformity noted with normal full range of motion of all joints.   Neurologic:  No cranial nerve deficits noted. Station and gait are normal. Plantar reflexes are down-going bilaterally. DTRs are symmetrical throughout. Sensory, motor and coordinative functions  appear intact. Skin:  Intact without suspicious lesions or rashes Psych:  Cognition and judgment appear intact. Alert and cooperative with normal attention span and concentration. No apparent delusions, illusions, hallucinations   Impression & Recommendations:  Problem # 1:  MIXED HYPERLIPIDEMIA (ICD-272.2) Assessment Unchanged  His updated medication list for this problem includes:    Niaspan 500 Mg Cr-tabs (Niacin (antihyperlipidemic)) .[name]... Take 2 tabs at bedtime  Orders: TLB-A1C / Hgb A1C (Glycohemoglobin) (83036-A1C) TLB-B12, Serum-Total ONLY (14782-N56) TLB-BMP (Basic Metabolic Panel-BMET) (80048-METABOL) TLB-Hepatic/Liver Function Pnl (80076-HEPATIC) TLB-CBC Platelet - w/Differential (85025-CBCD) TLB-Lipid Panel (80061-LIPID) TLB-TSH (Thyroid Stimulating Hormone) (84443-TSH) TLB-Udip ONLY (81003-UDIP)  Problem # 2:  PALPITATIONS (ICD-785.1) Assessment: Comment Only Resolved  Problem # 3:  ARTHRALGIA (ICD-719.40) Assessment: Improved  Problem # 4:  LOW BACK PAIN (ICD-724.2) Assessment: Improved  His updated medication list for this problem includes:    Aspirin 325 Mg Tabs (Aspirin) .[name]... Take 1 by mouth once daily  Orders: TLB-A1C / Hgb A1C (Glycohemoglobin) (83036-A1C) TLB-B12, Serum-Total ONLY (21308-M57) TLB-BMP (Basic Metabolic Panel-BMET) (80048-METABOL) TLB-Hepatic/Liver Function Pnl (80076-HEPATIC) TLB-CBC Platelet - w/Differential (85025-CBCD) TLB-Lipid Panel (80061-LIPID) TLB-TSH (Thyroid Stimulating Hormone) (84443-TSH) TLB-Udip ONLY (81003-UDIP)  Problem # 5:  RHEUMATOID ARTHRITIS, CHRONIC (ICD-714.0) Assessment: Improved On the regimen of medicine(s) reflected in the chart   He can try Predn 1/2 tab once daily if mood swings  Complete Medication List: 1)  Lorazepam 0.5 Mg Tabs (Lorazepam) .[name].. 1 - 2 two times a day prn 2)  Aspirin 325 Mg Tabs (Aspirin) .... Take 1 by mouth once daily 3)  Zolpidem Tartrate 10 Mg Tabs (Zolpidem tartrate) .[name].. 1 at  bedtime as needed 4)  Fish Oil Oil (Fish oil) .... 2 by mouth bid 5)  Vitamin D 1000 Unit Caps (Cholecalciferol) .... Take 1 by mouth qd 6)  Omeprazole 20 Mg Cpdr (Omeprazole) .... One by mouth daily 7)  Viagra 100 Mg Tabs (Sildenafil citrate) .[name].. 1 by mouth once daily prn 8)  Prednisone (pak) 5 Mg Tabs (Prednisone) .[name].. 1 once daily 9)  Fexofenadine Hcl 180 Mg Tabs (Fexofenadine hcl) .[name].. 1 once daily 10)  Trexall 15 Mg Tabs (Methotrexate sodium) .... Weekly 11)  Folic Acid 1 Mg Tabs (Folic acid) .... Once daily 12)  Magnesium Oxide 400 Mg Tabs (Magnesium oxide) .... 3 daily 13)  Cyanocobalamin 2500 Mcg Subl (Cyanocobalamin) .... Once daily 14)  Niaspan 500 Mg Cr-tabs (Niacin (antihyperlipidemic)) .... Take 1 tablet at bedtime x 4 weeks then increase to 2 tabs at bedtime 15)  Red Yeast Rice 600 Mg Caps (Red yeast rice extract) .[name].. 1 by mouth once daily 16)  Arava 20 Mg Tabs (Leflunomide) .[name].. 1 by mouth once daily  Patient Instructions: 1)  Please schedule a follow-up appointment in 6 months well w/labs.   Orders Added: 1)  Est. Patient Level IV [84696] 2)  TLB-A1C / Hgb A1C (Glycohemoglobin) [83036-A1C] 3)  TLB-B12, Serum-Total ONLY [82607-B12] 4)  TLB-BMP (Basic Metabolic Panel-BMET) [80048-METABOL] 5)  TLB-Hepatic/Liver Function Pnl [80076-HEPATIC]  6)  TLB-CBC Platelet - w/Differential [85025-CBCD] 7)  TLB-Lipid Panel [80061-LIPID] 8)  TLB-TSH (Thyroid Stimulating Hormone) [84443-TSH] 9)  TLB-Udip ONLY [81003-UDIP]

## 2010-12-17 NOTE — Letter (Signed)
Summary: [facility]   Imported By: Lester [place] 07/07/2010 07:29:32  _____________________________________________________________________  External Attachment:    Type:   Image     Comment:   External Document

## 2010-12-17 NOTE — Miscellaneous (Signed)
Summary: zolpidem  Medications Added ZOLPIDEM TARTRATE 10 MG  TABS (ZOLPIDEM TARTRATE) at bedtime as needed       Clinical Lists Changes  Medications: Added new medication of ZOLPIDEM TARTRATE 10 MG  TABS (ZOLPIDEM TARTRATE) at bedtime as needed - Signed Rx of ZOLPIDEM TARTRATE 10 MG  TABS (ZOLPIDEM TARTRATE) at bedtime as needed;  #30 x 6;  Signed;  Entered by: Tora Perches;  Authorized by: Tresa Garter MD;  Method used: Telephoned to [facility] [address] [place], [place]  [id number], Ph: [phone] or [phone], Fax: [phone]    Prescriptions: ZOLPIDEM TARTRATE 10 MG  TABS (ZOLPIDEM TARTRATE) at bedtime as needed  #30 x 6   Entered by:   Tora Perches   Authorized by:   Tresa Garter MD   Signed by:   Tora Perches on 03/15/2008   Method used:   Telephoned to ...       [facility]       [address]       [place], [place]  [id number]       Ph: [phone] or [phone]       Fax: [phone]   RxID:   [id number]

## 2010-12-17 NOTE — Assessment & Plan Note (Signed)
Summary: 3 mos f/u #/cd   Vital Signs:  Patient profile:   77 year old male Weight:      181 pounds Temp:     99 degrees F oral Pulse rate:   75 / minute BP sitting:   116 / 74  (left arm)  Vitals Entered By: Tora Perches (November 27, 2009 9:32 AM) CC: f/u Is Patient Diabetic? No   CC:  f/u.  History of Present Illness: F/u elev. chol, aches and cramps from chol med.  Preventive Screening-Counseling & Management  Alcohol-Tobacco     Smoking Status: quit  Current Medications (verified): 1)  Lorazepam 0.5 Mg Tabs (Lorazepam) .[name].. 1 - 2 Two Times A Day Prn 2)  Adult Aspirin Low Strength 81 Mg  Tbdp (Aspirin) .... Take 1 By Mouth Qd 3)  Zolpidem Tartrate 10 Mg  Tabs (Zolpidem Tartrate) .[name].. 1 At Bedtime As Needed 4)  Fish Oil   Oil (Fish Oil) .... 2 By Mouth Bid 5)  Vitamin D 1000 Unit  Caps (Cholecalciferol) .... Take 1 By Mouth Qd 6)  Omeprazole 20 Mg Cpdr (Omeprazole) .... One By Mouth Daily 7)  Viagra 100 Mg Tabs (Sildenafil Citrate) .[name].. 1 By Mouth Once Daily Prn  Allergies: 1)  ! Lipitor 2)  ! * Crestor 3)  Pravachol (Pravastatin Sodium)  Past History:  Past Surgical History: Last updated: 07/27/2007 Back Surgery (09/2003) EGD (10/05/2004)  Past Medical History: Colonic polyps, hx of Hyperlipidemia Low back pain Minimal plaque in carotids Allergic rhinitis Osteoarthritis GI Dr Marina Goodell Migraine w/aura Statin intolerance  Family History: Reviewed history from 10/31/2007 and no changes required. Family History Hypertension  Social History: Reviewed history from 10/31/2007 and no changes required. Retired Married  Review of Systems  The patient denies fever, weight gain, hemoptysis, abdominal pain, and melena.    Physical Exam  General:  Well-developed,well-nourished,in no acute distress; alert,appropriate and cooperative throughout examination Ears:  External ear exam shows no significant lesions or deformities.  Otoscopic examination reveals  clear canals, tympanic membranes are intact bilaterally without bulging, retraction, inflammation or discharge. Hearing is grossly normal bilaterally. Nose:  External nasal examination shows no deformity or inflammation. Nasal mucosa are pink and moist without lesions or exudates. Mouth:  Oral mucosa and oropharynx without lesions or exudates.  Teeth in good repair. Lungs:  Normal respiratory effort, chest expands symmetrically. Lungs are clear to auscultation, no crackles or wheezes. Heart:  Normal rate and regular rhythm. S1 and S2 normal without gallop, murmur, click, rub or other extra sounds. Abdomen:  Bowel sounds positive,abdomen soft and non-tender without masses, organomegaly or hernias noted. Msk:  No deformity or scoliosis noted of thoracic or lumbar spine.  R Depuetren contracture s/p surgery - better Extremities:  No clubbing, cyanosis, edema, or deformity noted with normal full range of motion of all joints.   Neurologic:  No cranial nerve deficits noted. Station and gait are normal. Plantar reflexes are down-going bilaterally. DTRs are symmetrical throughout. Sensory, motor and coordinative functions appear intact. Skin:  Intact without suspicious lesions or rashes   Impression & Recommendations:  Problem # 1:  ARTHRALGIA (ICD-719.40) off statins - resolved Assessment Improved  Problem # 2:  MYALGIA (ICD-729.1) - as above Assessment: Improved  The following medications were removed from the medication list:    Darvocet-n 100 100-650 Mg Tabs (Propoxyphene n-apap) .[name]... Take 1-2 q 4 hours prn His updated medication list for this problem includes:    Adult Aspirin Low Strength 81 Mg Tbdp (Aspirin) .Marland KitchenMarland KitchenMarland KitchenMarland Kitchen  Take 1 by mouth qd  Problem # 3:  HYPERLIPIDEMIA (ICD-272.4) - the best we can do Assessment: Deteriorated  The following medications were removed from the medication list:    Pravastatin Sodium 40 Mg Tabs (Pravastatin sodium) .[name]...  Problem # 4:  LOW BACK PAIN  (ICD-724.2) Assessment: Improved  The following medications were removed from the medication list:    Darvocet-n 100 100-650 Mg Tabs (Propoxyphene n-apap) .[name]... Take 1-2 q 4 hours prn His updated medication list for this problem includes:    Adult Aspirin Low Strength 81 Mg Tbdp (Aspirin) .[name]... Take 1 by mouth qd  Complete Medication List: 1)  Lorazepam 0.5 Mg Tabs (Lorazepam) .[name].. 1 - 2 two times a day prn 2)  Adult Aspirin Low Strength 81 Mg Tbdp (Aspirin) .... Take 1 by mouth qd 3)  Zolpidem Tartrate 10 Mg Tabs (Zolpidem tartrate) .[name].. 1 at bedtime as needed 4)  Fish Oil Oil (Fish oil) .... 2 by mouth bid 5)  Vitamin D 1000 Unit Caps (Cholecalciferol) .... Take 1 by mouth qd 6)  Omeprazole 20 Mg Cpdr (Omeprazole) .... One by mouth daily 7)  Viagra 100 Mg Tabs (Sildenafil citrate) .[name].. 1 by mouth once daily prn  Patient Instructions: 1)  Please schedule a follow-up appointment in 6 months well w/labs v70.0.

## 2010-12-17 NOTE — Assessment & Plan Note (Signed)
Summary: 3 MO ROA/NML   Vital Signs:  Patient Profile:   77 Years Old Male Weight:      181 pounds Temp:     98.6 degrees F oral Pulse rate:   78 / minute BP sitting:   161 / 101  (left arm)  Vitals Entered By: Tora Perches (February 06, 2008 11:44 AM)             Is Patient Diabetic? No     Chief Complaint:  Multiple medical problems or concerns.  History of Present Illness: The patient presents for a follow up of  hyperlipidemia     Current Allergies (reviewed today): ! LIPITOR ! * CRESTOR  Past Medical History:    Reviewed history from 07/27/2007 and no changes required:       Colonic polyps, hx of       Hyperlipidemia       Low back pain   Family History:    Reviewed history from 10/31/2007 and no changes required:       Family History Hypertension  Social History:    Reviewed history from 10/31/2007 and no changes required:       Retired       Married     Physical Exam  General:     Well-developed,well-nourished,in no acute distress; alert,appropriate and cooperative throughout examination Ears:     External ear exam shows no significant lesions or deformities.  Otoscopic examination reveals clear canals, tympanic membranes are intact bilaterally without bulging, retraction, inflammation or discharge. Hearing is grossly normal bilaterally. Mouth:     Oral mucosa and oropharynx without lesions or exudates.  Teeth in good repair. Lungs:     Normal respiratory effort, chest expands symmetrically. Lungs are clear to auscultation, no crackles or wheezes. Heart:     Normal rate and regular rhythm. S1 and S2 normal without gallop, murmur, click, rub or other extra sounds. Psych:     Oriented X3.      Impression & Recommendations:  Problem # 1:  HYPERLIPIDEMIA (ICD-272.4) Assessment: Improved Will increase Pravachol.His updated medication list for this problem includes:    Pravachol 20 Mg Tabs (Pravastatin sodium) .[name]... 2 po qd  His updated  medication list for this problem includes:    Pravachol 20 Mg Tabs (Pravastatin sodium) .[name]... 2 po qd   Problem # 2:  ABNORMAL GLUCOSE NEC (ICD-790.29) BP is nl at home  Complete Medication List: 1)  Pravachol 20 Mg Tabs (Pravastatin sodium) .... 2 po qd 2)  Aciphex 20 Mg Tbec (Rabeprazole sodium) .... Take 1 by mouth qd 3)  Darvocet-n 100 100-650 Mg Tabs (Propoxyphene n-apap) .... Take 1-2 q 4 hours prn 4)  Lorazepam 0.5 Mg Tabs (Lorazepam) .[name].. 1 - 2 two times a day prn 5)  Fish Oil Oil (Fish oil) .... 2 by mouth bid 6)  Adult Aspirin Low Strength 81 Mg Tbdp (Aspirin) .... Take 1 by mouth qd 7)  Vitamin D 1000 Unit Caps (Cholecalciferol) .... Take 1 by mouth qd   Patient Instructions: 1)  Please schedule a follow-up appointment in 4 months. 2)  Hepatic Panel prior to visit, ICD-9: 3)  Lipid Panel prior to visit, ICD-9:272.0    Prescriptions: PRAVACHOL 20 MG TABS (PRAVASTATIN SODIUM) 2 po qd  #60 x 11   Entered and Authorized by:   Tresa Garter MD   Signed by:   Tresa Garter MD on 02/06/2008   Method used:   Print then Give to Patient  RxID:   [id number]  ]

## 2010-12-17 NOTE — Assessment & Plan Note (Signed)
Summary: flu shot/plot/cd   Nurse Visit   Vital Signs:  Patient profile:   77 year old male Temp:     97.5 degrees F oral  Vitals Entered By: Lanier Prude, [facility]) (September 22, 2010 3:19 PM)  Allergies: 1)  ! Lipitor 2)  ! * Crestor 3)  Pravachol (Pravastatin Sodium)  Orders Added: 1)  Flu Vaccine 79yrs + MEDICARE PATIENTS [Q2039] 2)  Administration Flu vaccine - MCR [G0008]         Flu Vaccine Consent Questions     Do you have a history of severe allergic reactions to this vaccine? no    Any prior history of allergic reactions to egg and/or gelatin? no    Do you have a sensitivity to the preservative Thimersol? no    Do you have a past history of Guillan-Barre Syndrome? no    Do you currently have an acute febrile illness? no    Have you ever had a severe reaction to latex? no    Vaccine information given and explained to patient? yes    Are you currently pregnant? no    Lot Number:[id number]   Exp Date:05/15/2011   Site Given  Right Deltoid IM Lanier Prude, [facility])  September 22, 2010 3:20 PM

## 2010-12-17 NOTE — Assessment & Plan Note (Signed)
Summary: YEARLY FU / MEDICARE/ LABS SAME DAY /NWS  #   Vital Signs:  Patient profile:   77 year old male Height:      72 inches Weight:      176 pounds BMI:     23.96 O2 Sat:      97 % on Room air Temp:     97.9 degrees F oral Pulse rate:   84 / minute Pulse rhythm:   regular Resp:     14 per minute BP sitting:   140 / 90  (left arm) Cuff size:   regular  Vitals Entered By: Lanier Prude, CMA(AAMA) (June 04, 2010 10:32 AM)  O2 Flow:  Room air CC: CPX Is Patient Diabetic? No   CC:  CPX.  History of Present Illness: The patient presents for a wellness examination Patient past medical history, social history, and family history reviewed in detail no significant changes.  Patient is physically active. Depression is negative and mood is good. Hearing is normal, and able to perform activities of daily living. Risk of falling is negligible and home safety has been reviewed and is appropriate. Patient has normal height, weight, and visual acuity. Patient has been counseled on age-appropriate routine health concerns for screening and prevention. Education, counseling done.   He was dx'd w/RA this summer; f/u LBP, stiffness, dyslipidemia, elev glu  Current Medications (verified): 1)  Lorazepam 0.5 Mg Tabs (Lorazepam) .[name].. 1 - 2 Two Times A Day Prn 2)  Adult Aspirin Low Strength 81 Mg  Tbdp (Aspirin) .... Take 1 By Mouth Qd 3)  Zolpidem Tartrate 10 Mg  Tabs (Zolpidem Tartrate) .[name].. 1 At Bedtime As Needed 4)  Fish Oil   Oil (Fish Oil) .... 2 By Mouth Bid 5)  Vitamin D 1000 Unit  Caps (Cholecalciferol) .... Take 1 By Mouth Qd 6)  Omeprazole 20 Mg Cpdr (Omeprazole) .... One By Mouth Daily 7)  Viagra 100 Mg Tabs (Sildenafil Citrate) .[name].. 1 By Mouth Once Daily Prn 8)  Prednisone (Pak) 5 Mg Tabs (Prednisone) .[name].. 1 Once Daily 9)  Fexofenadine Hcl 180 Mg Tabs (Fexofenadine Hcl) .[name].. 1 Once Daily 10)  Trexall 10 Mg Tabs (Methotrexate Sodium) .[name].. 1 Weekly  Allergies (verified): 1)  !  Lipitor 2)  ! * Crestor 3)  Pravachol (Pravastatin Sodium)  Past History:  Past Surgical History: Last updated: 07/27/2007 Back Surgery (09/2003) EGD (10/05/2004)  Family History: Last updated: 10/31/2007 Family History Hypertension  Social History: Last updated: 10/31/2007 Retired Married  Past Medical History: Colonic polyps, hx of Hyperlipidemia Low back pain Minimal plaque in carotids Allergic rhinitis Osteoarthritis Dr Charlann Boxer GI Dr Marina Goodell Migraine w/aura Statin intolerance RA -  Dr Dareen Piano 2011  Review of Systems       The patient complains of weight loss, decreased hearing, and difficulty walking.  The patient denies anorexia, fever, weight gain, vision loss, hoarseness, chest pain, syncope, dyspnea on exertion, peripheral edema, prolonged cough, headaches, hemoptysis, abdominal pain, melena, hematochezia, severe indigestion/heartburn, hematuria, incontinence, genital sores, muscle weakness, suspicious skin lesions, transient blindness, depression, unusual weight change, abnormal bleeding, enlarged lymph nodes, angioedema, and testicular masses.         Better now  Physical Exam  General:  Well-developed,well-nourished,in no acute distress; alert,appropriate and cooperative throughout examination Head:  Normocephalic and atraumatic without obvious abnormalities. No apparent alopecia or balding. Eyes:  No corneal or conjunctival inflammation noted. EOMI. Perrla. Funduscopic exam benign, without hemorrhages, exudates or papilledema. Vision grossly normal. Nose:  External nasal examination  shows no deformity or inflammation. Nasal mucosa are pink and moist without lesions or exudates. Mouth:  Oral mucosa and oropharynx without lesions or exudates.  Teeth in good repair. Neck:  No deformities, masses, or tenderness noted. Lungs:  Normal respiratory effort, chest expands symmetrically. Lungs are clear to auscultation, no crackles or wheezes. Heart:  Normal rate and  regular rhythm. S1 and S2 normal without gallop, murmur, click, rub or other extra sounds. Abdomen:  Bowel sounds positive,abdomen soft and non-tender without masses, organomegaly or hernias noted. Rectal:  No external abnormalities noted. Normal sphincter tone. No rectal masses or tenderness. G(-) Genitalia:  Testes bilaterally descended without nodularity, tenderness or masses. No scrotal masses or lesions. No penis lesions or urethral discharge. Prostate:  1+ enlarged.   Msk:  L hip pain Pulses:  R and L carotid,radial,femoral,dorsalis pedis and posterior tibial pulses are full and equal bilaterally Extremities:  No clubbing, cyanosis, edema, or deformity noted with normal full range of motion of all joints.   Neurologic:  No cranial nerve deficits noted. Station and gait are normal. Plantar reflexes are down-going bilaterally. DTRs are symmetrical throughout. Sensory, motor and coordinative functions appear intact. Skin:  Intact without suspicious lesions or rashes Cervical Nodes:  No lymphadenopathy noted Inguinal Nodes:  No significant adenopathy Psych:  Cognition and judgment appear intact. Alert and cooperative with normal attention span and concentration. No apparent delusions, illusions, hallucinations   Impression & Recommendations:  Problem # 1:  PHYSICAL EXAMINATION (ICD-V70.0) Assessment New  Overall doing well, age appropriate education and counseling updated and referral for appropriate preventive services done unless declined, immunizations up to date or declined, diet counseling done if overweight, urged to quit smoking if smokes, most recent labs reviewed and current ordered if appropriate, ecg reviewed or declined (interpretation per ECG scanned in the EMR if done); information regarding Medicare Preventation requirements given if appropriate.  Get labs Orders: EKG w/ Interpretation (93000) EKG w/ Interpretation (93000) WNL TLB-BMP (Basic Metabolic Panel-BMET)  (80048-METABOL) TLB-CBC Platelet - w/Differential (85025-CBCD) TLB-Hepatic/Liver Function Pnl (80076-HEPATIC) TLB-TSH (Thyroid Stimulating Hormone) (84443-TSH) TLB-PSA (Prostate Specific Antigen) (84153-PSA) TLB-Udip ONLY (81003-UDIP)  Problem # 2:  RHEUMATOID ARTHRITIS, CHRONIC (ICD-714.0) Assessment: New  On MTX now  Orders: T-2 View CXR, Same Day (71020.5TC)  Problem # 3:  ARTHRALGIA (ICD-719.40) Assessment: Improved  Problem # 4:  HYPERLIPIDEMIA (ICD-272.4) Statin intol. Labs Reviewed: SGOT: 19 (11/21/2009)   SGPT: 18 (11/21/2009)   HDL:44.40 (11/21/2009), 52.10 (05/13/2009)  LDL:112 (05/13/2009), 133 (10/14/2008)  Chol:246 (11/21/2009), 180 (05/13/2009)  Trig:133.0 (11/21/2009), 79.0 (05/13/2009)  Problem # 5:  LOW BACK PAIN (ICD-724.2) Assessment: Improved  His updated medication list for this problem includes:    Adult Aspirin Low Strength 81 Mg Tbdp (Aspirin) .[name]... Take 1 by mouth qd  Problem # 6:  ABNORMAL GLUCOSE NEC (ICD-790.29) Assessment: Comment Only Diet discussed  Complete Medication List: 1)  Lorazepam 0.5 Mg Tabs (Lorazepam) .[name].. 1 - 2 two times a day prn 2)  Adult Aspirin Low Strength 81 Mg Tbdp (Aspirin) .... Take 1 by mouth qd 3)  Zolpidem Tartrate 10 Mg Tabs (Zolpidem tartrate) .[name].. 1 at bedtime as needed 4)  Fish Oil Oil (Fish oil) .... 2 by mouth bid 5)  Vitamin D 1000 Unit Caps (Cholecalciferol) .... Take 1 by mouth qd 6)  Omeprazole 20 Mg Cpdr (Omeprazole) .... One by mouth daily 7)  Viagra 100 Mg Tabs (Sildenafil citrate) .[name].. 1 by mouth once daily prn 8)  Prednisone (pak) 5 Mg Tabs (Prednisone) .Marland KitchenMarland KitchenMarland Kitchen  1 once daily 9)  Fexofenadine Hcl 180 Mg Tabs (Fexofenadine hcl) .[name].. 1 once daily 10)  Trexall 10 Mg Tabs (Methotrexate sodium) .Marland KitchenMarland KitchenMarland Kitchen 1 weekly  Other Orders: Pneumococcal Vaccine (81191) Admin 1st Vaccine (47829) TLB-B12, Serum-Total ONLY (56213-Y86) First annual wellness visit with prevention plan  [phone])  Patient Instructions: 1)  Please  schedule a follow-up appointment in 6 months. m  Immunizations Administered:  Pneumonia Vaccine:    Vaccine Type: Pneumovax    Site: right deltoid    Mfr: Merck    Dose: 0.5 ml    Route: IM    Given by: Lanier Prude, CMA(AAMA)    Exp. Date: 09/09/2011    Lot #: [id number]    VIS given: 06/12/96 version given June 04, 2010.

## 2010-12-17 NOTE — Assessment & Plan Note (Signed)
Summary: heart skipping/hospital f/u/mt  Medications Added TREXALL 15 MG TABS (METHOTREXATE SODIUM) weekly FOLIC ACID 1 MG TABS (FOLIC ACID) once daily MAGNESIUM OXIDE 400 MG TABS (MAGNESIUM OXIDE) 3 daily CYANOCOBALAMIN 2500 MCG SUBL (CYANOCOBALAMIN) once daily        Visit Type:  Initial Consult Primary Provider:  Tresa Garter MD   History of Present Illness: The patient presents today for routine cardiology followup. He reports doing very well since last being seen by me in the ER.  His palpitations have significantly improved.  He denies further dizziness.  The patient denies symptoms of chest pain, shortness of breath, orthopnea, PND, lower extremity edema, dizziness, presyncope, syncope, or neurologic sequela. He plays golf and enjoys an active lifestyle.  The patient is tolerating medications without difficulties and is otherwise without complaint today.   Current Medications (verified): 1)  Lorazepam 0.5 Mg Tabs (Lorazepam) .[name].. 1 - 2 Two Times A Day Prn 2)  Adult Aspirin Low Strength 81 Mg  Tbdp (Aspirin) .... Take 1 By Mouth Qd 3)  Zolpidem Tartrate 10 Mg  Tabs (Zolpidem Tartrate) .[name].. 1 At Bedtime As Needed 4)  Fish Oil   Oil (Fish Oil) .... 2 By Mouth Bid 5)  Vitamin D 1000 Unit  Caps (Cholecalciferol) .... Take 1 By Mouth Qd 6)  Omeprazole 20 Mg Cpdr (Omeprazole) .... One By Mouth Daily 7)  Viagra 100 Mg Tabs (Sildenafil Citrate) .[name].. 1 By Mouth Once Daily Prn 8)  Prednisone (Pak) 5 Mg Tabs (Prednisone) .[name].. 1 Once Daily 9)  Fexofenadine Hcl 180 Mg Tabs (Fexofenadine Hcl) .[name].. 1 Once Daily 10)  Trexall 15 Mg Tabs (Methotrexate Sodium) .... Weekly 11)  Folic Acid 1 Mg Tabs (Folic Acid) .... Once Daily 12)  Magnesium Oxide 400 Mg Tabs (Magnesium Oxide) .... 3 Daily 13)  Cyanocobalamin 2500 Mcg Subl (Cyanocobalamin) .... Once Daily  Allergies: 1)  ! Lipitor 2)  ! * Crestor 3)  Pravachol (Pravastatin Sodium)  Past History:  Past Medical History: Colonic  polyps, hx of Hyperlipidemia Low back pain Minimal plaque in carotids Allergic rhinitis Osteoarthritis Dr Charlann Boxer GI Dr Marina Goodell Migraine w/aura Statin intolerance PACs RA -  Dr Dareen Piano 2011  Past Surgical History: Reviewed history from 07/27/2007 and no changes required. Back Surgery (09/2003) EGD (10/05/2004)  Family History: Reviewed history from 10/31/2007 and no changes required. Family History Hypertension  Social History: Reviewed history from 10/31/2007 and no changes required. Retired Married  Review of Systems       All systems are reviewed and negative except as listed in the HPI.   Vital Signs:  Patient profile:   77 year old male Height:      72 inches Weight:      181 pounds BMI:     24.64 Pulse rate:   87 / minute BP sitting:   120 / 80  (left arm)  Vitals Entered By: Laurance Flatten CMA (July 22, 2010 10:10 AM)  Physical Exam  General:  Well developed, well nourished, in no acute distress. Head:  normocephalic and atraumatic Eyes:  PERRLA/EOM intact; conjunctiva and lids normal. Mouth:  Teeth, gums and palate normal. Oral mucosa normal. Neck:  Neck supple, no JVD. No masses, thyromegaly or abnormal cervical nodes. Lungs:  Clear bilaterally to auscultation and percussion. Heart:  Non-displaced PMI, chest non-tender; regular rate and rhythm, S1, S2 without murmurs, rubs or gallops. Carotid upstroke normal, no bruit. Normal abdominal aortic size, no bruits. Femorals normal pulses, no bruits. Pedals normal pulses.  No edema, no varicosities. Abdomen:  Bowel sounds positive; abdomen soft and non-tender without masses, organomegaly, or hernias noted. No hepatosplenomegaly. Msk:  Back normal, normal gait. Muscle strength and tone normal. Pulses:  pulses normal in all 4 extremities Extremities:  No clubbing or cyanosis. Neurologic:  Alert and oriented x 3.   Echocardiogram  Procedure date:  07/16/2010  Findings:       Study Conclusions    - Left  ventricle: The cavity size was normal. There was mild     concentric hypertrophy. Systolic function was normal. The     estimated ejection fraction was in the range of 55% to 60%. Wall     motion was normal; there were no regional wall motion     abnormalities. Doppler parameters are consistent with abnormal     left ventricular relaxation (grade 1 diastolic dysfunction).   - Aortic valve: Mild regurgitation.   - Mitral valve: Calcified annulus.   Transthoracic echocardiography. M-mode, complete 2D, spectral   Doppler, and color Doppler. Height: Height: 182.9cm. Height: 72in.   Weight: Weight: 78.9kg. Weight: 173.6lb. Body mass index: BMI:   23.6kg/m^2. Body surface area: BSA: 2.45m^2. Patient status:   Outpatient. Location: [facility]  Left atrium                        Mitral valve   AP dim           34 mm     ------  Peak E vel    50.3 cm/s   -------   AP dim index   1.69 cm/m^2 <2.2    Peak A vel    73.5 cm/s   -------                        Prepared and Electronically Authenticated by    Olga Millers, MD, [facility]   2011-09-01T18:[id number]    EKG  Procedure date:  07/22/2010  Findings:      sinus rhythm 87 bpm, otherwise normal ekg  Impression & Recommendations:  Problem # 1:  PALPITATIONS (ICD-785.1) The patient has frequent PACs.  During a recent ER visit, he had PACs in bigeminy.  He wore a holter monitor which I have reviewed with him today.  This monitor revealed <1% PACs.  He had rare nonsustained atrial tachycardia, but no afib. With adequate hydration, his symptoms of dizziness and palpitations have improved.  His recent echo was normal. We will therefore folllow supportively at this time.  If his PACs or palpitations worsen, I would recommend cardizem CD 180mg  daily  Problem # 2:  HYPERLIPIDEMIA (ICD-272.4) The patient has chronic hyperlipidemia which has been difficult to control due to significant statin intolerance. His last lipid profile was elevated in  January 11.  He has since been taking Red Yeast Rice daily.  He is tolerating this medicine well. We will therefore repeat fasting lipids.  If above goal, we could consider niacin or zetia as second line agents.  Other Orders: EKG w/ Interpretation (93000) TLB-Lipid Panel (80061-LIPID)  Patient Instructions: 1)  Your physician recommends that you return for lab work in: 07/23/10 0830 2)  Your physician recommends that you continue on your current medications as directed. Please refer to the Current Medication list given to you today. 3)  Your physician wants you to follow-up in: 6 months  You will receive a reminder letter in the mail two months in advance. If you  don't receive a letter, please call our office to schedule the follow-up appointment.

## 2010-12-17 NOTE — Progress Notes (Signed)
Summary: heart skipping   Phone Note Call from Patient Call back at Work Phone [phone]   Caller: Patient Reason for Call: Talk to Nurse Summary of Call: pt to be set up for 2 week fu with Gisele Pack, a 2 d echo, and a 48 hr monitor, pt concerned that his heart still skipping beats and wants to know if we need to do anything different  for him- i have sent ruth the monitor request, and will schedule the rest after we have talked with the pt-pls call [phone] Initial call taken by: Glynda Jaeger,  July 07, 2010 8:33 AM  Follow-up for Phone Call        lmom for pt to call me back.  Spoke with DrAllred will give pt Cardizem if necessary and make sure he has his echo and 48 hour monitor scheduled.  Also will schedule 2 week follow up with DrAllred Dennis Bast, RN, BSN  July 07, 2010 1:04 PM  Additional Follow-up for Phone Call Additional follow up Details #1::        pls call pt on work number which is his cell number  Omer Jack  July 07, 2010 1:28 PM  spoke with pt he is feeling fine and will go ahead and get the test and follow up as planed in 2 weeks Dennis Bast, RN, BSN  July 07, 2010 4:02 PM  New Problems: PALPITATIONS (ICD-785.1)   New Problems: PALPITATIONS (ICD-785.1)

## 2010-12-17 NOTE — Assessment & Plan Note (Signed)
Summary: problem w/medicine/#/cd   Vital Signs:  Patient profile:   77 year old male Weight:      176 pounds Temp:     97.6 degrees F oral Pulse rate:   75 / minute BP sitting:   136 / 78  (left arm)  Vitals Entered By: Tora Perches (August 21, 2009 8:45 AM) CC: f/u on meds Is Patient Diabetic? No   CC:  f/u on meds.  History of Present Illness: C/o leg cramps retuned. No cramps when goes off Pravachol. Also - tired on Pravachol.  Preventive Screening-Counseling & Management  Alcohol-Tobacco     Smoking Status: quit  Current Medications (verified): 1)  Lorazepam 0.5 Mg Tabs (Lorazepam) .[name].. 1 - 2 Two Times A Day Prn 2)  Adult Aspirin Low Strength 81 Mg  Tbdp (Aspirin) .... Take 1 By Mouth Qd 3)  Zolpidem Tartrate 10 Mg  Tabs (Zolpidem Tartrate) .... At Bedtime As Needed 4)  Darvocet-N 100 100-650 Mg  Tabs (Propoxyphene N-Apap) .... Take 1-2 Q 4 Hours Prn 5)  Fish Oil   Oil (Fish Oil) .... 2 By Mouth Bid 6)  Vitamin D 1000 Unit  Caps (Cholecalciferol) .... Take 1 By Mouth Qd 7)  Pravastatin Sodium 40 Mg  Tabs (Pravastatin Sodium) .... 2  By Mouth At Bedtime 8)  Omeprazole 20 Mg Cpdr (Omeprazole) .... One By Mouth Daily 9)  Viagra 100 Mg Tabs (Sildenafil Citrate) .[name].. 1 By Mouth Once Daily Prn  Allergies: 1)  ! Lipitor 2)  ! * Crestor 3)  Pravachol (Pravastatin Sodium)  Past History:  Social History: Last updated: 10/31/2007 Retired Married  Past Medical History: Colonic polyps, hx of Hyperlipidemia Low back pain Minimal plaque in carotids Allergic rhinitis Osteoarthritis GI Dr Marina Goodell Migraine w/aura  Physical Exam  General:  Well-developed,well-nourished,in no acute distress; alert,appropriate and cooperative throughout examination Nose:  External nasal examination shows no deformity or inflammation. Nasal mucosa are pink and moist without lesions or exudates. Mouth:  Oral mucosa and oropharynx without lesions or exudates.  Teeth in good repair. Neck:   No deformities, masses, or tenderness noted. Lungs:  Normal respiratory effort, chest expands symmetrically. Lungs are clear to auscultation, no crackles or wheezes. Heart:  Normal rate and regular rhythm. S1 and S2 normal without gallop, murmur, click, rub or other extra sounds. Abdomen:  Bowel sounds positive,abdomen soft and non-tender without masses, organomegaly or hernias noted. Msk:  No deformity or scoliosis noted of thoracic or lumbar spine.  R Depuetren contracture Extremities:  No clubbing, cyanosis, edema, or deformity noted with normal full range of motion of all joints.   Neurologic:  No cranial nerve deficits noted. Station and gait are normal. Plantar reflexes are down-going bilaterally. DTRs are symmetrical throughout. Sensory, motor and coordinative functions appear intact. Skin:  Intact without suspicious lesions or rashes Psych:  Cognition and judgment appear intact. Alert and cooperative with normal attention span and concentration. No apparent delusions, illusions, hallucinations   Impression & Recommendations:  Problem # 1:  HYPERLIPIDEMIA (ICD-272.4) Assessment Comment Only  His updated medication list for this problem includes:    Pravastatin Sodium 40 Mg Tabs (Pravastatin sodium) .[name]... 2  by mouth at bedtime - unable to tolerate: we had a long discussion of pros and cons of   Problem # 2:  FATIGUE (ICD-780.79) poss related to #1 Assessment: New  Problem # 3:  MYALGIA (ICD-729.1) due to Pravachol  His updated medication list for this problem includes:    Adult Aspirin  Low Strength 81 Mg Tbdp (Aspirin) .[name]... Take 1 by mouth qd    Darvocet-n 100 100-650 Mg Tabs (Propoxyphene n-apap) .[name]... Take 1-2 q 4 hours prn  Problem # 4:  OSTEOARTHRITIS (ICD-715.90) Assessment: Unchanged  His updated medication list for this problem includes:    Adult Aspirin Low Strength 81 Mg Tbdp (Aspirin) .[name]... Take 1 by mouth qd    Darvocet-n 100 100-650 Mg Tabs (Propoxyphene n-apap)  .[name]... Take 1-2 q 4 hours prn  Problem # 5:  ABNORMAL GLUCOSE NEC (ICD-790.29) Assessment: Improved  Complete Medication List: 1)  Lorazepam 0.5 Mg Tabs (Lorazepam) .[name].. 1 - 2 two times a day prn 2)  Adult Aspirin Low Strength 81 Mg Tbdp (Aspirin) .... Take 1 by mouth qd 3)  Zolpidem Tartrate 10 Mg Tabs (Zolpidem tartrate) .[name].. 1 at bedtime as needed 4)  Darvocet-n 100 100-650 Mg Tabs (Propoxyphene n-apap) .... Take 1-2 q 4 hours prn 5)  Fish Oil Oil (Fish oil) .... 2 by mouth bid 6)  Vitamin D 1000 Unit Caps (Cholecalciferol) .... Take 1 by mouth qd 7)  Pravastatin Sodium 40 Mg Tabs (Pravastatin sodium) .... 2  by mouth at bedtime 8)  Omeprazole 20 Mg Cpdr (Omeprazole) .... One by mouth daily 9)  Viagra 100 Mg Tabs (Sildenafil citrate) .[name].. 1 by mouth once daily prn  Other Orders: Flu Vaccine 63yrs + (52841) Administration Flu vaccine - MCR (L2440)  Patient Instructions: 1)  OK to stop Pravachol 2)  Try ground Flax seed 1 scoop a day 3)  Use stretching exercises that I have provided (15 min. or longer every day) 4)  Try to eat more raw plant food, fresh and dry fruit, raw almonds, leafy vegetables, whole foods and less red meat, less animal fat. Poultry and fish is better for you than pork and beef. Avoid processed foods (canned soups, hot dogs, sausage, bacon , frozen dinners). Avoid corn syrup, high fructose syrup or aspartam and Splenda  containing drinks. Honey, Agave and Stevia are better sweeteners. Make your own  dressing with olive oil, wine vinegar, lemon juce, garlic etc. for your salads. 5)  [email or web address] 6)  Please schedule a follow-up appointment in 3 months. 7)  BMP prior to visit, ICD-9: 8)  Hepatic Panel prior to visit, ICD-9: 9)  Lipid Panel prior to visit, ICD-9:272.0 10)  CK 995.20 Prescriptions: LORAZEPAM 0.5 MG TABS (LORAZEPAM) 1 - 2 two times a day prn  #60 x 6   Entered and Authorized by:   Tresa Garter MD   Signed by:   Tresa Garter  MD on 08/21/2009   Method used:   Print then Give to Patient   RxID:   [id number] ZOLPIDEM TARTRATE 10 MG  TABS (ZOLPIDEM TARTRATE) 1 at bedtime as needed  #30 x 6   Entered and Authorized by:   Tresa Garter MD   Signed by:   Tresa Garter MD on 08/21/2009   Method used:   Print then Give to Patient   RxID:   [id number]   Flu Vaccine Consent Questions     Do you have a history of severe allergic reactions to this vaccine? no    Any prior history of allergic reactions to egg and/or gelatin? no    Do you have a sensitivity to the preservative Thimersol? no    Do you have a past history of Guillan-Barre Syndrome? no    Do you currently have an acute febrile illness? no    Have  you ever had a severe reaction to latex? no    Vaccine information given and explained to patient? yes    Are you currently pregnant? no    Lot Number:[id number]   Exp Date:05/14/2010   Site Given  Left Deltoid IMdflu

## 2010-12-17 NOTE — Miscellaneous (Signed)
  Medications Added LORAZEPAM 0.5 MG TABS (LORAZEPAM) 1 - 2 two times a day prn       Clinical Lists Changes  Medications: Added new medication of LORAZEPAM 0.5 MG TABS (LORAZEPAM) 1 - 2 two times a day prn - Signed Rx of LORAZEPAM 0.5 MG TABS (LORAZEPAM) 1 - 2 two times a day prn;  #60 x 6;  Signed;  Entered by: Tresa Garter MD;  Authorized by: Tresa Garter MD;  Method used: Print then Give to Patient    Prescriptions: LORAZEPAM 0.5 MG TABS (LORAZEPAM) 1 - 2 two times a day prn  #60 x 6   Entered and Authorized by:   Tresa Garter MD   Signed by:   Tresa Garter MD on 10/23/2007   Method used:   Print then Give to Patient   RxID:   [id number]

## 2010-12-17 NOTE — Consult Note (Signed)
Summary: [facility]   Imported By: Sherian Rein 06/17/2010 15:13:45  _____________________________________________________________________  External Attachment:    Type:   Image     Comment:   External Document

## 2010-12-17 NOTE — Letter (Signed)
[facility]   Imported By: Lester [place] 12/18/2009 09:26:25  _____________________________________________________________________  External Attachment:    Type:   Image     Comment:   External Document

## 2010-12-17 NOTE — Letter (Signed)
[facility]   Imported By: Lester [place] 10/28/2009 08:31:42  _____________________________________________________________________  External Attachment:    Type:   Image     Comment:   External Document

## 2010-12-17 NOTE — Letter (Signed)
[facility]   Imported By: Lester  05/27/2009 10:04:51  _____________________________________________________________________  External Attachment:    Type:   Image     Comment:   External Document

## 2010-12-17 NOTE — Medication Information (Signed)
Summary: [facility]   Imported By: Lester [place] 05/14/2010 09:04:43  _____________________________________________________________________  External Attachment:    Type:   Image     Comment:   External Document

## 2010-12-17 NOTE — Letter (Signed)
Summary: [facility]   Imported By: Lennie Odor 04/21/2010 14:24:55  _____________________________________________________________________  External Attachment:    Type:   Image     Comment:   External Document

## 2010-12-29 ENCOUNTER — Telehealth: Payer: Self-pay | Admitting: Internal Medicine

## 2011-01-06 NOTE — Progress Notes (Signed)
Summary: ?   Phone Note Call from Patient   Caller: [phone] - WIFE Summary of Call: Pt is currenlty in [place] and has "bronchitis". He was seen by MD in [place], given omnicef and florastor. Wife wants to know if Dr Posey Rea agrees with this?  Initial call taken by: Lamar Sprinkles, CMA,  December 29, 2010 9:29 AM  Follow-up for Phone Call        I agree. Also use -  Use over-the-counter medicines for "cold": Tylenol  650mg  or Advil 400mg  every 6 hours  for fever; Delsym or Robutussin for cough. Mucinex for congestion. Ricola or Halls for sore throat.  Thank you!  Follow-up by: Tresa Garter MD,  December 29, 2010 11:23 AM  Additional Follow-up for Phone Call Additional follow up Details #1::        Pt informed  Additional Follow-up by: Lamar Sprinkles, CMA,  December 29, 2010 11:35 AM

## 2011-01-12 ENCOUNTER — Telehealth: Payer: Self-pay | Admitting: Internal Medicine

## 2011-01-15 ENCOUNTER — Telehealth: Payer: Self-pay | Admitting: Internal Medicine

## 2011-01-21 NOTE — Progress Notes (Signed)
Summary: LAB LETTER   Phone Note Call from Patient   Summary of Call: Patient is requesting letter regarding lab results mailed to him. I see note that says letter but do no see it? He only wants it mailed again if possible.  Initial call taken by: Lamar Sprinkles, CMA,  January 12, 2011 12:35 PM  Follow-up for Phone Call        I probably handwrote something. Pls send him a lab report Thank you!  Follow-up by: Tresa Garter MD,  January 12, 2011 5:31 PM  Additional Follow-up for Phone Call Additional follow up Details #1::        Mailed Additional Follow-up by: Lamar Sprinkles, CMA,  January 12, 2011 5:49 PM

## 2011-01-21 NOTE — Progress Notes (Signed)
Summary: REQ FOR RX  Phone Note Call from Patient Call back at Home Phone [phone]   Caller: Patient---[phone] Call For: Dr Posey Rea Summary of Call: Pt has deep cough, slight sore throat,no fever. [facility] is pharmacy. Please advise. Initial call taken by: Verdell Face,  January 15, 2011 2:40 PM  Follow-up for Phone Call        Pt left vm - he is on  his way back from [facility] and req rx for cough med from MD Follow-up by: Lamar Sprinkles, CMA,  January 15, 2011 3:10 PM  Additional Follow-up for Phone Call Additional follow up Details #1::        OK Prom/cod Additional Follow-up by: Tresa Garter MD,  January 15, 2011 5:37 PM    Additional Follow-up for Phone Call Additional follow up Details #2::    Pt informed  Follow-up by: Lamar Sprinkles, CMA,  January 15, 2011 6:32 PM  New/Updated Medications: PROMETHAZINE-CODEINE 6.25-10 MG/5ML SYRP (PROMETHAZINE-CODEINE) 5-10 ml by mouth q id as needed cough Prescriptions: PROMETHAZINE-CODEINE 6.25-10 MG/5ML SYRP (PROMETHAZINE-CODEINE) 5-10 ml by mouth q id as needed cough  #300 ml x 0   Entered by:   Lamar Sprinkles, CMA   Authorized by:   Tresa Garter MD   Signed by:   Lamar Sprinkles, CMA on 01/15/2011   Method used:   Telephoned to ...       [facility] [address] [phone]* (retail)       [address]       [place], [place]  [address]       Ph: [phone] or [phone]       Fax: [phone]   RxID:   [id number]

## 2011-01-28 ENCOUNTER — Encounter: Payer: Self-pay | Admitting: Internal Medicine

## 2011-01-28 ENCOUNTER — Ambulatory Visit (INDEPENDENT_AMBULATORY_CARE_PROVIDER_SITE_OTHER): Payer: Medicare Other | Admitting: Internal Medicine

## 2011-01-28 DIAGNOSIS — E785 Hyperlipidemia, unspecified: Secondary | ICD-10-CM

## 2011-01-28 DIAGNOSIS — I4949 Other premature depolarization: Secondary | ICD-10-CM

## 2011-01-28 LAB — POCT I-STAT, CHEM 8
BUN: 14 mg/dL (ref 6–23)
Calcium, Ion: 1.12 mmol/L (ref 1.12–1.32)
Chloride: 106 mEq/L (ref 96–112)
Creatinine, Ser: 0.9 mg/dL (ref 0.4–1.5)
Glucose, Bld: 141 mg/dL — ABNORMAL HIGH (ref 70–99)
HCT: 42 % (ref 39.0–52.0)
Hemoglobin: 14.3 g/dL (ref 13.0–17.0)
Potassium: 4 mEq/L (ref 3.5–5.1)
Sodium: 140 mEq/L (ref 135–145)
TCO2: 25 mmol/L (ref 0–100)

## 2011-01-28 LAB — CBC
HCT: 39.7 % (ref 39.0–52.0)
Hemoglobin: 13.4 g/dL (ref 13.0–17.0)
MCH: 31.8 pg (ref 26.0–34.0)
MCHC: 33.8 g/dL (ref 30.0–36.0)
MCV: 94.1 fL (ref 78.0–100.0)
Platelets: 291 10*3/uL (ref 150–400)
RBC: 4.22 MIL/uL (ref 4.22–5.81)
RDW: 15.6 % — ABNORMAL HIGH (ref 11.5–15.5)
WBC: 8.1 10*3/uL (ref 4.0–10.5)

## 2011-01-28 LAB — POCT CARDIAC MARKERS
CKMB, poc: 1.1 ng/mL (ref 1.0–8.0)
CKMB, poc: <1 ng/mL — ABNORMAL LOW (ref 1.0–8.0)
Myoglobin, poc: 53.3 ng/mL (ref 12–200)
Myoglobin, poc: 69.3 ng/mL (ref 12–200)
Troponin i, poc: <0.05 ng/mL (ref 0.00–0.09)
Troponin i, poc: <0.05 ng/mL (ref 0.00–0.09)

## 2011-01-28 LAB — DIFFERENTIAL
Basophils Absolute: 0 10*3/uL (ref 0.0–0.1)
Basophils Relative: 0 % (ref 0–1)
Eosinophils Absolute: 0 10*3/uL (ref 0.0–0.7)
Eosinophils Relative: 0 % (ref 0–5)
Lymphocytes Relative: 16 % (ref 12–46)
Lymphs Abs: 1.3 10*3/uL (ref 0.7–4.0)
Monocytes Absolute: 0.5 10*3/uL (ref 0.1–1.0)
Monocytes Relative: 6 % (ref 3–12)
Neutro Abs: 6.3 10*3/uL (ref 1.7–7.7)
Neutrophils Relative %: 78 % — ABNORMAL HIGH (ref 43–77)

## 2011-01-28 LAB — TSH: TSH: 0.595 u[IU]/mL (ref 0.350–4.500)

## 2011-02-02 NOTE — Assessment & Plan Note (Signed)
Summary: follow up from 07/22/10/per pt call/mj/kl  Medications Added NIASPAN 500 MG CR-TABS (NIACIN (ANTIHYPERLIPIDEMIC)) take 1 tablet at bedtime      Allergies Added:   Visit Type:  Follow-up Primary Provider:  Tresa Garter MD   History of Present Illness: The patient presents today for routine cardiology followup. He reports doing very well since last being seen.  His palpitations have resolved.  He denies further dizziness.  The patient denies symptoms of chest pain, shortness of breath, orthopnea, PND, lower extremity edema, dizziness, presyncope, syncope, or neurologic sequela. He plays golf and enjoys an active lifestyle.  He has occasional indigestion but no exertional difficulty.  The patient is tolerating medications without difficulties and is otherwise without complaint today.   Current Medications (verified): 1)  Lorazepam 0.5 Mg Tabs (Lorazepam) .[name].. 1 - 2 Two Times A Day Prn 2)  Aspirin 325 Mg Tabs (Aspirin) .... Take 1 By Mouth Once Daily 3)  Zolpidem Tartrate 10 Mg  Tabs (Zolpidem Tartrate) .[name].. 1 At Bedtime As Needed 4)  Fish Oil   Oil (Fish Oil) .... 2 By Mouth Bid 5)  Vitamin D 1000 Unit  Caps (Cholecalciferol) .... Take 1 By Mouth Qd 6)  Omeprazole 20 Mg Cpdr (Omeprazole) .... One By Mouth Daily 7)  Viagra 100 Mg Tabs (Sildenafil Citrate) .[name].. 1 By Mouth Once Daily Prn 8)  Prednisone (Pak) 5 Mg Tabs (Prednisone) .[name].. 1 Once Daily 9)  Fexofenadine Hcl 180 Mg Tabs (Fexofenadine Hcl) .[name].. 1 Once Daily 10)  Folic Acid 1 Mg Tabs (Folic Acid) .... Once Daily 11)  Cyanocobalamin 2500 Mcg Subl (Cyanocobalamin) .... Once Daily 12)  Niaspan 500 Mg Cr-Tabs (Niacin (Antihyperlipidemic)) .... Take 1 Tablet At Bedtime 13)  Red Yeast Rice 600 Mg Caps (Red Yeast Rice Extract) .[name].. 1 By Mouth Once Daily 14)  Arava 20 Mg Tabs (Leflunomide) .[name].. 1 By Mouth Once Daily 15)  Promethazine-Codeine 6.25-10 Mg/80ml Syrp (Promethazine-Codeine) .... 5-10 Ml By Mouth Q Id As Needed  Cough  Allergies (verified): 1)  ! Lipitor 2)  ! * Crestor 3)  Pravachol (Pravastatin Sodium) 4)  Methotrexate  Past History:  Past Medical History: Reviewed history from 12/08/2010 and no changes required. Colonic polyps, hx of Hyperlipidemia Low back pain Minimal plaque in carotids Allergic rhinitis Osteoarthritis Dr Charlann Boxer GI Dr Marina Goodell Migraine w/aura Statin intolerance PACs RA -  Dr Dareen Piano 2011 [facility] 2011 - one episode due to ? MTX AKs Dr Londell Moh  Past Surgical History: Reviewed history from 07/27/2007 and no changes required. Back Surgery (09/2003) EGD (10/05/2004)  Social History: Reviewed history from 10/31/2007 and no changes required. Retired Married  Vital Signs:  Patient profile:   77 year old male Height:      72 inches Weight:      184 pounds BMI:     25.05 Pulse rate:   83 / minute BP sitting:   106 / 80  (left arm)  Vitals Entered By: Laurance Flatten CMA (January 28, 2011 10:26 AM)  Physical Exam  General:  Well developed, well nourished, in no acute distress. Head:  normocephalic and atraumatic Eyes:  PERRLA/EOM intact; conjunctiva and lids normal. Mouth:  Teeth, gums and palate normal. Oral mucosa normal. Neck:  Neck supple, no JVD. No masses, thyromegaly or abnormal cervical nodes. Lungs:  Clear bilaterally to auscultation and percussion. Heart:  Non-displaced PMI, chest non-tender; regular rate and rhythm, S1, S2 without murmurs, rubs or gallops. Carotid upstroke normal, no bruit. Normal abdominal aortic size,  no bruits. Femorals normal pulses, no bruits. Pedals normal pulses. No edema, no varicosities. Abdomen:  Bowel sounds positive; abdomen soft and non-tender without masses, organomegaly, or hernias noted. No hepatosplenomegaly. Msk:  Back normal, normal gait. Muscle strength and tone normal. Extremities:  No clubbing or cyanosis. Neurologic:  Alert and oriented x 3. Skin:  Intact without lesions or rashes.   EKG  Procedure date:   01/28/2011  Findings:      sinus rhythm 83 bpm, otherwise normal ekg  Impression & Recommendations:  Problem # 1:  PALPITATIONS (ICD-785.1) his PACs have much improved no changes today  Problem # 2:  MIXED HYPERLIPIDEMIA (ICD-272.2) recent lipid profile obtained by pcp reveals Tchol above goal.  The pt admits to poor compliance with red yeast rice and niaspan at that time.  He states that he is now taking them regularly and will have lipids repeated by pcp.  Patient Instructions: 1)  Your physician wants you to follow-up in 12 months :   You will receive a reminder letter in the mail two months in advance. If you don't receive a letter, please call our office to schedule the follow-up appointment.

## 2011-02-17 LAB — POCT HEMOGLOBIN-HEMACUE: Hemoglobin: 15 g/dL (ref 13.0–17.0)

## 2011-03-08 ENCOUNTER — Telehealth: Payer: Self-pay | Admitting: *Deleted

## 2011-03-08 NOTE — Telephone Encounter (Signed)
rf req for Lorazepam 0.5mg  1-2 po bid prn. # 60.... Last filled 01-05-11.... Ok to Rf?

## 2011-03-08 NOTE — Telephone Encounter (Signed)
Also req rf on Zolpidem 10mg .Marland KitchenMarland KitchenMarland Kitchen1 po qhs prn # 90... Last filled 09/09/2010... Ok to Rf?

## 2011-03-08 NOTE — Telephone Encounter (Signed)
OK to fill this prescription with additional refills x 5 both Thank you!

## 2011-03-10 MED ORDER — ZOLPIDEM TARTRATE 10 MG PO TABS: 10.0000 mg | ORAL_TABLET | Freq: Every evening | ORAL | Status: DC | PRN

## 2011-03-10 MED ORDER — LORAZEPAM 0.5 MG PO TABS: 0.5000 mg | ORAL_TABLET | Freq: Two times a day (BID) | ORAL | Status: DC | PRN

## 2011-03-30 NOTE — Assessment & Plan Note (Signed)
[facility]                            CARDIOLOGY OFFICE NOTE   [name], [name]                         MRN:          [id number]  DATE:07/09/2008                            DOB:          [birth year]    PRIMARY CARE PHYSICIAN:  Evan Quint. Plotnikov, MD.   CLINICAL HISTORY:  [name] is 77 years old and was seen by me on  referral from [name] in consultation for symptoms of fatigue and  decreased exercise tolerance.  We evaluated him with a echocardiogram,  which was normal and with a rest stress Myoview scan, which showed good  LV function.  No evidence of ischemia.  He did have a hypertensive  response with exercise and his blood pressure going from 146/88 to  228/94.   He has done fairly well since that time.  He feels like he has improved  some in terms of his fatigue.  His wife [name] who was with him wondered  if he might had some element of depression since he lost his brother  recently and has had the adjustment of retirement.  He also has been on  pravastatin and previously been intolerant to LIPITOR and he wondered if  some of his symptoms might be related to pravastatin.  He has not had  too much in the way of muscle aching, however.   PAST MEDICAL HISTORY:  Significant for hyperlipidemia.   CURRENT MEDICATIONS:  1. Pravastatin 40 mg daily.  2. Omeprazole.  3. Aspirin.  4. Fish oil.   PHYSICAL EXAMINATION:  VITAL SIGNS:  Today, the blood pressure was  133/85 and pulse 77 and regular.  NECK:  There was no venous distention.  Carotid pulses were full without  bruits.  CHEST:  Clear.  HEART:  Rhythm was regular.  Heart sounds were normal.  There were no  murmurs or gallops.  ABDOMEN:  Soft with normal bowel sounds.  EXTREMITIES:  Peripheral pulses are full.  No peripheral edema.   IMPRESSION:  1. Fatigue.  2. No evidence of organic heart disease with normal echocardiography      and negative Myoview scan.  3. Hypertensive  response to exercise.  4. History of myalgias and cramps on Lipitor.  5. Positive family history of coronary heart disease.   RECOMMENDATIONS:  I reassured [name] that there was no evidence of  significant ischemic organic heart disease.  Also his CBC, BMP and TSH  were normal.  He does have a hypertensive response to exercise and I  told him I did not think this was enough, that we need to start  medication, but that increasing the frequency and intensity of his  regular exercise would help with this.  He does walk regularly with the  morning walking group at [facility].  It is also possible  some of his symptoms could be related to statin.  We got a CK and CK-MB  on him today.  If these are abnormal, then I think we should make a  change.  If not, then I will defer that to  Dr. Posey Todd.  I am not  convinced that pravastatin is the problem, but it is possible that it  could be contributing to his symptoms.  Finally, his wife thought there  could be an element of depression, although [name] does not say that  he feels depressed.  I told him I would defer to Dr. Posey Todd to decide  if we should consider any treatment for that.  I will see [name] back  on a p.r.n. basis.     Bruce Elvera Lennox Juanda Chance, MD, [facility]  Electronically Signed    BRB/MedQ  DD: 07/09/2008  DT: 07/10/2008  Job #: [id number]   cc:   Evan Quint. Plotnikov, MD

## 2011-03-30 NOTE — Assessment & Plan Note (Signed)
[facility]                            CARDIOLOGY OFFICE NOTE   [name], [name]                         MRN:          [id number]  DATE:06/14/2008                            DOB:          23-Aug-1933    REFERRING PHYSICIAN:  Kerry Kass, M.D. [facility]   PRIMARY CARE PHYSICIAN:  Georgina Quint. Plotnikov, MD.   REASON FOR REFERRAL:  Symptoms of fatigue and decreased exercise  tolerance.   CLINICAL HISTORY:  [name] is 77 years old and was referred by Dr.  Stevphen Rochester for symptoms of fatigue and decreased exercise tolerance.  He has been seeing Dennard Nip for allergies and attributed these symptoms to  his allergies, but Dennard Nip felt that there may be more than this and  arranged for a cardiac evaluation.  He says that when he walks on the  golf course, he is much more tired by the end of 18 holes than he used  to be.  He has a generalized fatigue, which may occur not necessarily  related to exertion.  He does walk with the Golf Group at [employer]  and seems to keep up pretty well with the Walking Group.  He said, he  has had no chest pain and no palpitations.   PAST MEDICAL HISTORY:  Significant for hyperlipidemia.  He had been  treated with Lipitor, but he developed leg cramps.  Currently on  pravastatin under the direction of Dr. Posey Rea.  There is no history  of diabetes or hypertension.  Past medical history is also significant  for low back pain.  He has had previous surgery by Dr. Channing Mutters.   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  He is married and has  2 grown children, one of them is [employer] who is a Network engineer of mine.  He does not smoke.   FAMILY HISTORY:  Positive for heart disease and his father had heart  attacks in the 74s, although he lived until his 6s, at which time he  died of heart failure.  He has one brother who has diabetes.   REVIEW OF  SYSTEMS:  Positive for cramps and low back pain.   PHYSICAL EXAMINATION:  VITAL SIGNS:  Today, the blood pressure was  142/83 and the pulse 79 and regular.  NECK:  There was no venous distension.  The carotid pulses were full and  there were no bruits.  CHEST:  Clear without rales or rhonchi.  CARDIAC:  Rhythm was regular.  I could hear no murmurs or gallops.  Heart sounds were normal.  ABDOMEN:  Soft with normal bowel sounds.  There is no  hepatosplenomegaly.  EXTREMITIES:  Peripheral pulses were full.  There is no peripheral  edema.  MUSCULOSKELETAL:  Showed no deformities.  SKIN:  Warm and dry.  NEUROLOGIC:  Showed no focal neurological signs.   An electrocardiogram was normal.   IMPRESSION:  1. Fatigue and decreased exercise tolerance, rule out ischemic heart  disease.  2. Hyperlipidemia with intolerance to Lipitor.  3. Positive family history for coronary heart disease.  4. Lumbar spine disease status post surgery with chronic low back      pain.   RECOMMENDATIONS:  [name] has a moderate profile for vascular disease  given his age, sex, hyperlipidemia, and some positive family history.  His exertional symptoms could be cardiac related, and I think this  should be evaluated further.  We will plan to obtain a rest/stress  exercise Myoview scan, and also a 2D echocardiogram.  I have obtained  some of laboratory studies from Dr. Loren Racer office.  His cholesterol  on pravastatin was 205 with an HDL of 43 and LDL, which was not  calculated and normal liver function tests.  If he did not have a CBC,  BMP and TSH then we will get those.  I will plan to see him back in  about 4 weeks to follow up on results of these tests and decide about  further evaluation.     Evan Elvera Lennox Juanda Chance, MD, [facility]  Electronically Signed    BRB/MedQ  DD: 06/14/2008  DT: 06/15/2008  Job #: [id number]   cc:   Georgina Quint. Plotnikov, MD  Kerry Kass, M.D. [facility]

## 2011-04-02 NOTE — H&P (Signed)
NAME:  [name], [name] NO.:  [id number]   MEDICAL RECORD NO.:  [id number]                   PATIENT TYPE:  INP   LOCATION:  2899                                 FACILITY:  [facility]   PHYSICIAN:  Payton Doughty, M.D.                   DATE OF BIRTH:  [birth year]   DATE OF ADMISSION:  10/01/2003  DATE OF DISCHARGE:                                HISTORY & PHYSICAL   ADMISSION DIAGNOSIS:  Lumbar spondylosis of L4-5 and L5-S1.   HISTORY:  This is a very nice 77 year old right-handed gentleman who has had  increasing back pain for a number of years beginning last spring. He had  progressive increase in back pain down his legs, worse on the left than on  the right. He had wanted to wait to do something until later in the winter,  but he now has difficulty with back pain every day. It is incapacitating to  the point he cannot walk out and get his mail. He does have any difficulty  with his bladder.   PAST MEDICAL HISTORY:  Benign.   MEDICATIONS:  1. Bextra p.r.n.  2. Lipitor 20 mg daily.  3. Darvocet on a very limited p.r.n. basis.   ALLERGIES:  He has no allergies.   PAST SURGICAL HISTORY:  His only operation has been a tonsillectomy numerous  years ago.   SOCIAL HISTORY:  He does not smoke, drinks on a social basis. He is an avid  Teacher, English as a foreign language. He is retired from the [employer].   FAMILY HISTORY:  Mom and dad are both deceased; both had lived to old ages,  and he has a history of longevity in his family.   REVIEW OF SYSTEMS:  Remarkable for glasses, hearing loss, mouth sores with  anti-inflammatories, back pain, leg pain, and arthritis.   PHYSICAL EXAMINATION:  HEENT:  Within normal limits.  NECK:  Reasonable range of motion in his neck.  CHEST:  Clear.  CARDIAC:  Regular rate and rhythm.  ABDOMEN:  Nontender. No hepatosplenomegaly.  EXTREMITIES:  Without clubbing or cyanosis.  GENITALIA:  Deferred.  EXTREMITIES:  Peripheral pulses are good.  NEUROLOGICAL:  He is awake, alert, and oriented. Cranial nerves are intact.  Motor examination shows 5/5 strength in the upper and lower extremities.  Sensory deficits described in L5 and L4 distribution bilaterally. Knee jerks  are 1, ankle jerks are flicker. Straight leg raise is negative.   DIAGNOSTIC STUDIES:  He has a MRI that has a grade 1 slip of L4 on L5 and  through flexion and extension, it changes from grade 1 in neutral to grade 2  in flexion and extension.   CLINICAL IMPRESSION:  Lumbar spondylosis with spondylolisthesis of L4 and L5  with intractable back pain. The plan is for lumbar laminectomy and  diskectomy, posterior lumbar interbody fusion with Ray fusion cages, and  posterolateral  arthrodesis with ankle fixation at L4-5 and L5-S1. The risks  and benefits of this approach have been discussed with him, and he wishes to  proceed.                                                Payton Doughty, M.D.    MWR/MEDQ  D:  10/01/2003  T:  10/01/2003  Job:  [id number]

## 2011-04-02 NOTE — Op Note (Signed)
NAME:  [name], SCHWAGER NO.:  [id number]   MEDICAL RECORD NO.:  [id number]                   PATIENT TYPE:  AMB   LOCATION:  DSC                                  FACILITY:  [facility]   PHYSICIAN:  Cindee Salt, M.D.                    DATE OF BIRTH:  09/05/1933   DATE OF PROCEDURE:  12/31/2003  DATE OF DISCHARGE:                                 OPERATIVE REPORT   PREOPERATIVE DIAGNOSIS:  Mass, right thumb.   POSTOPERATIVE DIAGNOSIS:  Mass, right thumb.   SURGEON:  Cindee Salt, M.D.   ANESTHESIA:  Forearm base, IV regional.   HISTORY:  The patient is a 77 year old male with a history of a mass in the  dorsal aspect proximal phalanx of the right thumb.  He desires removal.   DESCRIPTION OF PROCEDURE:  The patient was brought to the operating room.  Forearm based IV regional anesthetic was carried out without difficulty.  He  was prepped using DuraPrep in the supine position, right arm free.  An  oblique incision was made over the mass and carried down through the  subcutaneous tissue.  The bleeders were electrocauterized.  A multilobulated  very vascular tumor well circumscribed was easily identified with blunt and  sharp dissection.  This was dissected free arising from one of the dorsal  veins which was cauterized.  The entire specimen was sent to pathology.  The  wound was irrigated.  The skin was closed with interrupted 5-0 nylon  sutures.  A sterile, compressive dressing and splint to the thumb was  applied.  The patient tolerated the procedure well and was taken to the  recovery room for observation in satisfactory condition.  He is discharged  home to return to the [facility] in [place] in one week on Vicodin and  Keflex.                                               Cindee Salt, M.D.    Angelique Blonder  D:  12/31/2003  T:  12/31/2003  Job:  [id number]

## 2011-04-02 NOTE — Discharge Summary (Signed)
NAME:  BOYKIN, BAETZ NO.:  [id number]   MEDICAL RECORD NO.:  [id number]                   PATIENT TYPE:  INP   LOCATION:  3023                                 FACILITY:  [facility]   PHYSICIAN:  Payton Doughty, M.D.                   DATE OF BIRTH:  1933/02/18   DATE OF ADMISSION:  10/01/2003  DATE OF DISCHARGE:  10/05/2003                                 DISCHARGE SUMMARY   ADMISSION DIAGNOSIS:  Spondylosis, L4-5 and L5-S1.   DISCHARGE DIAGNOSIS:  Spondylosis, L4-5 and L5-S1.   PROCEDURE:  An L4-5, L5-S1 laminectomy, diskectomy, posterior lumbar  interbody fusion cages __________, posterolateral arthrodesis.   INDICATIONS:  Posterolateral arthrodesis.   DISCHARGE STATUS:  Well.   COMPLICATIONS:  None.   BODY OF TEXT:  A 77 year old right-handed gentleman whose history and  physical is recounted on the chart.  He has had back pain for a number of  years.  It is worsening.  MR and plain films show spondylolisthesis of a  degenerative nature at L4-5 and spondylosis of L5-S1.  He is admitted for  fusion.  Neurologic exam shows some mild dorsiflexion and weakness.  Otherwise intact.   He was admitted after ascertaining normal laboratory values and underwent  fusion of L4-5 and L5-S1.  Postoperatively, he has done reasonably well.  His strength is full.  He participated in PT for a couple of days and did  well.  His Foley was removed the second day.  His PCA was stopped the second  day.  He is stable now on oral pain medications, using a little of Ambien  for sleep.  He is being discharged home on Percocet, Ambien, and a small  amount of ciprofloxacin.   His followup will be in the [facility] office in  about 10 days for suture removal.                                                Payton Doughty, M.D.    MWR/MEDQ  D:  10/05/2003  T:  10/06/2003  Job:  [id number]

## 2011-04-02 NOTE — Op Note (Signed)
NAME:  SEAB, AXEL NO.:  [id number]   MEDICAL RECORD NO.:  [id number]                   PATIENT TYPE:  INP   LOCATION:  3023                                 FACILITY:  [facility]   PHYSICIAN:  Payton Doughty, M.D.                   DATE OF BIRTH:  1933-03-12   DATE OF PROCEDURE:  10/01/2003  DATE OF DISCHARGE:                                 OPERATIVE REPORT   PREOPERATIVE DIAGNOSES:  1. Spondylosis at L5-S1.  2. Spondylosis with spondylolisthesis at L4-5.   PROCEDURE:  1. L4-5, L5-S1 laminectomy, diskectomy, posterior lumbar interbody fusion     with Ray threaded fusion cages.  2. Posterolateral arthrodesis with VTOS and bone marrow aspirate.   SURGEON:  Payton Doughty, M.D.   ANESTHESIA:  General endotracheal.   PREPARATION:  Prepped with sterile Betadine and prepped and scrubbed with  alcohol wipe.   COMPLICATIONS:  None.   ASSISTANT:  Danae Orleans. Venetia Maxon, M.D. and nurse [place].   DESCRIPTION OF PROCEDURE:  This is a 77 year old gentleman with severe  lumbar spondylosis who is taken to the operating room and smoothly  anesthetized and intubated and placed prone on the operating room table.  Following a shave, prep and drape in the usual sterile fashion, the skin was  infiltrated with 1% lidocaine with 1:400,000 epinephrine.  The skin was  incised from mid-L3 to mid-S1, and the lamina and transverse processes of L4  and L5, the sacrum and the sacral ala were exposed bilaterally in the  subperiosteal plane.  An intraoperative x-ray confirmed the correctness of  the level.  Having confirmed the correctness of the level, the pars  interarticularis, lamina and inferior facet of L4 and L5, and the superior  facet of L5 and S1 were removed bilaterally using the high-speed drill.  At  L4-5 there was severe compression of the L4 root.  It was a little bit worse  on the right than on the left, and this was by the spondylitic disease of  the L4-5 joint.  On  the right side, there was especially severe compression  of the L5 nerve root, as it traversed the L5 pedicle just distal to the  slip.  L5-S1 had degenerative disk disease, but the nerve roots appeared to  be relatively intact.  Following a complete decompression at both levels on  both sides and a complete diskectomy, Ray threaded fusion cages 16 mm x 21  mm were placed bilaterally while protecting both the superior and inferior  root at each level.  After the placement of the cages, an intraoperative x-  ray showed good placement.  They were packed with bone graft harvested from  the facet joints.  Bone marrow was aspirated from the left S1 pedicle and  sacrum.  This was used to saturate the VTOS foam which was then placed over  the decorticated  ala and transverse processes of L4 and L5, as a  posterolateral arthrodesis.  Prior to this the wound had been irrigated.  Hemostasis was assured.  The fascia was reapproximated with #0 Vicryl in an  interrupted fashion.  The subcutaneous tissues were reapproximated with #0  Vicryl in an interrupted fashion.  The  subcuticular tissues were reapproximated with #3-0 Vicryl in an interrupted  fashion.  The skin was closed with #3-0 nylon in a running lock fashion.  Betadine and Telfa dressing was applied and made occlusive with Op-Site.  The patient was returned to the recovery room in good condition.                                               Payton Doughty, M.D.    MWR/MEDQ  D:  10/01/2003  T:  10/02/2003  Job:  [id number]

## 2011-04-05 ENCOUNTER — Telehealth: Payer: Self-pay | Admitting: *Deleted

## 2011-04-05 MED ORDER — OMEPRAZOLE 20 MG PO CPDR: 20.0000 mg | DELAYED_RELEASE_CAPSULE | Freq: Every day | ORAL | Status: DC

## 2011-04-05 NOTE — Telephone Encounter (Signed)
Needs RF to go to mail order, done, need to call pt to inform

## 2011-04-05 NOTE — Telephone Encounter (Signed)
Patient informed. 

## 2011-05-11 ENCOUNTER — Ambulatory Visit (INDEPENDENT_AMBULATORY_CARE_PROVIDER_SITE_OTHER): Payer: Medicare Other | Admitting: Internal Medicine

## 2011-05-11 ENCOUNTER — Encounter: Payer: Self-pay | Admitting: Internal Medicine

## 2011-05-11 VITALS — BP 136/64 | HR 80 | Ht 72.0 in | Wt 183.0 lb

## 2011-05-11 DIAGNOSIS — K219 Gastro-esophageal reflux disease without esophagitis: Secondary | ICD-10-CM

## 2011-05-11 DIAGNOSIS — K625 Hemorrhage of anus and rectum: Secondary | ICD-10-CM

## 2011-05-11 DIAGNOSIS — Z86010 Personal history of colonic polyps: Secondary | ICD-10-CM

## 2011-05-11 MED ORDER — PEG-KCL-NACL-NASULF-NA ASC-C 100 G PO SOLR: 1.0000 | Freq: Once | ORAL | Status: DC

## 2011-05-11 NOTE — Progress Notes (Signed)
HISTORY OF PRESENT ILLNESS:  [name] is a 77 y.o. male with multiple medical problems as listed below. [name] is followed in this office for a history of adenomatous colon polyps and GERD. [name] presents today regarding transient rectal bleeding. Patient last underwent surveillance colonoscopy November 2008. [name] was found to have a diminutive colon polyp, diverticulosis, and internal hemorrhoids. Routine followup in 5 years recommended. [name] also has a history of GERD for which she is maintained on omeprazole. On omeprazole no reflux symptoms. [name] has had problems with rheumatoid arthritis for which she has been on different medications. Last fall [name] developed atrial fibrillation. [name]'s had issues with hyperlipidemia for which [name] was on niacin. Subsequently developed rash. Had problems with diarrhea about 6-8 weeks ago. Noticed darker blood in the stool on several occasions. This has since cleared. [name] denies abdominal pain, change in bowel habits, or weight loss. No associated rectal pain. [name] will be traveling to [place] next month.   REVIEW OF SYSTEMS:  All non-GI ROS negative except for sinus and allergies and arthritis.  Past Medical History  Diagnosis Date  . Colon polyp   . Hyperlipidemia   . Osteoarthritis   . Migraine with aura   . RA (rheumatoid arthritis)   . Atrial fibrillation   . GERD (gastroesophageal reflux disease)     Past Surgical History  Procedure Date  . Back surgery 2004    Social History ADRON Todd  reports that [name] has quit smoking. [name] has never used smokeless tobacco. [name] reports that [name] drinks alcohol. [name] reports that [name] does not use illicit drugs.  family history is negative for Colon cancer.  Allergies  Allergen Reactions  . Atorvastatin   . Methotrexate     REACTION: tachycardia  . Pravastatin Sodium     REACTION: cramps, fatigue  . Rosuvastatin        PHYSICAL EXAMINATION: Vital signs: BP 136/64  Pulse 80  Ht 6' (1.829 m)  Wt 183 lb (83.008 kg)  BMI  24.82 kg/m2  Constitutional: generally well-appearing, no acute distress Psychiatric: alert and oriented x3, cooperative Eyes: extraocular movements intact, anicteric, conjunctiva pink Mouth: oral pharynx moist, no lesions Neck: supple no lymphadenopathy Cardiovascular: heart regular rate and rhythm, no murmur Lungs: clear to auscultation bilaterally Abdomen: soft, nontender, nondistended, no obvious ascites, no peritoneal signs, normal bowel sounds, no organomegaly Rectal: Deferred until colonoscopy Extremities: no lower extremity edema bilaterally Skin: no lesions on visible extremities Neuro: No focal deficits.   ASSESSMENT:  #1. Transient rectal bleeding. They have been secondary to hemorrhoids or minor diverticular bleed. Cannot exclude neoplasia. Last colonoscopy about 4 years ago #2. GERD. Controlled with omeprazole #3. History of adenomatous colon polyps  PLAN:  #1. Schedule colonoscopy to evaluate bleeding.The nature of the procedure, as well as the risks, benefits, and alternatives were carefully and thoroughly reviewed with the patient. Ample time for discussion and questions allowed. The patient understood, was satisfied, and agreed to proceed. Movi prep prescribed. The patient instructed on use. [name] will arrange the examination sometime after [name] returns from his trip #2. Continue reflux precautions and PPI

## 2011-05-11 NOTE — Patient Instructions (Signed)
Colon [facility] 06/23/11 8:00 am arrive at 7:30 am Moviprep sent to your pharmacy Colon brochure given for you to review.

## 2011-05-12 ENCOUNTER — Encounter: Payer: Self-pay | Admitting: Internal Medicine

## 2011-06-01 ENCOUNTER — Other Ambulatory Visit: Payer: Self-pay | Admitting: Internal Medicine

## 2011-06-01 ENCOUNTER — Other Ambulatory Visit: Payer: Self-pay

## 2011-06-01 DIAGNOSIS — Z0389 Encounter for observation for other suspected diseases and conditions ruled out: Secondary | ICD-10-CM

## 2011-06-01 DIAGNOSIS — Z0000 Encounter for general adult medical examination without abnormal findings: Secondary | ICD-10-CM

## 2011-06-04 ENCOUNTER — Telehealth: Payer: Self-pay | Admitting: *Deleted

## 2011-06-04 ENCOUNTER — Other Ambulatory Visit: Payer: Self-pay | Admitting: Internal Medicine

## 2011-06-04 ENCOUNTER — Other Ambulatory Visit (INDEPENDENT_AMBULATORY_CARE_PROVIDER_SITE_OTHER): Payer: Medicare Other

## 2011-06-04 DIAGNOSIS — Z0389 Encounter for observation for other suspected diseases and conditions ruled out: Secondary | ICD-10-CM

## 2011-06-04 DIAGNOSIS — I10 Essential (primary) hypertension: Secondary | ICD-10-CM

## 2011-06-04 DIAGNOSIS — E785 Hyperlipidemia, unspecified: Secondary | ICD-10-CM

## 2011-06-04 DIAGNOSIS — Z0000 Encounter for general adult medical examination without abnormal findings: Secondary | ICD-10-CM

## 2011-06-04 DIAGNOSIS — Z125 Encounter for screening for malignant neoplasm of prostate: Secondary | ICD-10-CM

## 2011-06-04 LAB — URINALYSIS
Bilirubin Urine: NEGATIVE
Hgb urine dipstick: NEGATIVE
Ketones, ur: NEGATIVE
Leukocytes, UA: NEGATIVE
Nitrite: NEGATIVE
Specific Gravity, Urine: 1.02 (ref 1.000–1.030)
Total Protein, Urine: NEGATIVE
Urine Glucose: NEGATIVE
Urobilinogen, UA: 0.2 (ref 0.0–1.0)
pH: 6 (ref 5.0–8.0)

## 2011-06-04 LAB — CBC WITH DIFFERENTIAL/PLATELET
Basophils Absolute: 0 10*3/uL (ref 0.0–0.1)
Basophils Relative: 0.5 % (ref 0.0–3.0)
Eosinophils Absolute: 0 10*3/uL (ref 0.0–0.7)
Eosinophils Relative: 0.6 % (ref 0.0–5.0)
HCT: 38.5 % — ABNORMAL LOW (ref 39.0–52.0)
Hemoglobin: 13 g/dL (ref 13.0–17.0)
Lymphocytes Relative: 48.2 % — ABNORMAL HIGH (ref 12.0–46.0)
Lymphs Abs: 1.9 10*3/uL (ref 0.7–4.0)
MCHC: 33.8 g/dL (ref 30.0–36.0)
MCV: 96.1 fl (ref 78.0–100.0)
Monocytes Absolute: 0.4 10*3/uL (ref 0.1–1.0)
Monocytes Relative: 9.6 % (ref 3.0–12.0)
Neutro Abs: 1.7 10*3/uL (ref 1.4–7.7)
Neutrophils Relative %: 41.1 % — ABNORMAL LOW (ref 43.0–77.0)
Platelets: 327 10*3/uL (ref 150.0–400.0)
RBC: 4.01 Mil/uL — ABNORMAL LOW (ref 4.22–5.81)
RDW: 14 % (ref 11.5–14.6)
WBC: 4 10*3/uL — ABNORMAL LOW (ref 4.5–10.5)

## 2011-06-04 LAB — BASIC METABOLIC PANEL
BUN: 13 mg/dL (ref 6–23)
CO2: 31 mEq/L (ref 19–32)
Calcium: 8.7 mg/dL (ref 8.4–10.5)
Chloride: 107 mEq/L (ref 96–112)
Creatinine, Ser: 0.7 mg/dL (ref 0.4–1.5)
GFR: 111.09 mL/min (ref >60.00)
Glucose, Bld: 97 mg/dL (ref 70–99)
Potassium: 3.8 mEq/L (ref 3.5–5.1)
Sodium: 143 mEq/L (ref 135–145)

## 2011-06-04 LAB — LDL CHOLESTEROL, DIRECT: Direct LDL: 163.9 mg/dL

## 2011-06-04 LAB — HEPATIC FUNCTION PANEL
ALT: 19 U/L (ref 0–53)
AST: 21 U/L (ref 0–37)
Albumin: 3.7 g/dL (ref 3.5–5.2)
Alkaline Phosphatase: 52 U/L (ref 39–117)
Bilirubin, Direct: 0.1 mg/dL (ref 0.0–0.3)
Total Bilirubin: 0.7 mg/dL (ref 0.3–1.2)
Total Protein: 6.9 g/dL (ref 6.0–8.3)

## 2011-06-04 LAB — LIPID PANEL
Cholesterol: 218 mg/dL — ABNORMAL HIGH (ref 0–200)
HDL: 50.9 mg/dL (ref >39.00)
Total CHOL/HDL Ratio: 4
Triglycerides: 72 mg/dL (ref 0.0–149.0)
VLDL: 14.4 mg/dL (ref 0.0–40.0)

## 2011-06-04 LAB — PSA: PSA: 1.52 ng/mL (ref 0.10–4.00)

## 2011-06-04 LAB — TSH: TSH: 0.87 u[IU]/mL (ref 0.35–5.50)

## 2011-06-08 ENCOUNTER — Ambulatory Visit (INDEPENDENT_AMBULATORY_CARE_PROVIDER_SITE_OTHER): Payer: Medicare Other | Admitting: Internal Medicine

## 2011-06-08 ENCOUNTER — Encounter: Payer: Self-pay | Admitting: Internal Medicine

## 2011-06-08 DIAGNOSIS — IMO0001 Reserved for inherently not codable concepts without codable children: Secondary | ICD-10-CM

## 2011-06-08 DIAGNOSIS — E785 Hyperlipidemia, unspecified: Secondary | ICD-10-CM

## 2011-06-08 DIAGNOSIS — R7309 Other abnormal glucose: Secondary | ICD-10-CM

## 2011-06-08 DIAGNOSIS — M069 Rheumatoid arthritis, unspecified: Secondary | ICD-10-CM

## 2011-06-08 DIAGNOSIS — I10 Essential (primary) hypertension: Secondary | ICD-10-CM

## 2011-06-08 DIAGNOSIS — E782 Mixed hyperlipidemia: Secondary | ICD-10-CM

## 2011-06-08 NOTE — Assessment & Plan Note (Signed)
Labs OK

## 2011-06-08 NOTE — Assessment & Plan Note (Signed)
Starting Remicade soon. On Predn now

## 2011-06-08 NOTE — Assessment & Plan Note (Signed)
Resolved

## 2011-06-08 NOTE — Assessment & Plan Note (Signed)
Tolerating red rice yeast ok  Lab Results  Component Value Date   WBC 4.0* 06/04/2011   HGB 13.0 06/04/2011   HCT 38.5* 06/04/2011   PLT 327.0 06/04/2011   CHOL 218* 06/04/2011   TRIG 72.0 06/04/2011   HDL 50.90 06/04/2011   LDLDIRECT 163.9 06/04/2011   ALT 19 06/04/2011   AST 21 06/04/2011   NA 143 06/04/2011   K 3.8 06/04/2011   CL 107 06/04/2011   CREATININE 0.7 06/04/2011   BUN 13 06/04/2011   CO2 31 06/04/2011   TSH 0.87 06/04/2011   PSA 1.52 06/04/2011   HGBA1C 6.3 12/08/2010

## 2011-06-08 NOTE — Progress Notes (Signed)
  Subjective:    Patient ID: [name], male    DOB: 1933-01-28, 77 y.o.   MRN: [id number]  HPI  The patient presents for a follow-up of  chronic hypertension, chronic dyslipidemia, RA, controlled with medicines and Red rice yeast. He came back from a trip to [place] where he had a stomach bug x 1 d and a flare up of his RA x 1 d. Doing well now   Review of Systems  Constitutional: Negative for appetite change, fatigue and unexpected weight change.  HENT: Negative for nosebleeds, congestion, sore throat, sneezing, trouble swallowing and neck pain.   Eyes: Negative for itching and visual disturbance.  Respiratory: Negative for cough.   Cardiovascular: Negative for chest pain, palpitations and leg swelling.  Gastrointestinal: Negative for nausea, diarrhea, blood in stool and abdominal distention.  Genitourinary: Negative for frequency and hematuria.  Musculoskeletal: Negative for back pain, joint swelling and gait problem.  Skin: Negative for rash.  Neurological: Negative for dizziness, tremors, speech difficulty and weakness.  Psychiatric/Behavioral: Negative for sleep disturbance, dysphoric mood and agitation. The patient is not nervous/anxious.        Objective:   Physical Exam  Constitutional: He is oriented to person, place, and time. He appears well-developed.  HENT:  Mouth/Throat: Oropharynx is clear and moist.       A little eryth throat  Eyes: Conjunctivae are normal. Pupils are equal, round, and reactive to light.  Neck: Normal range of motion. No JVD present. No thyromegaly present.  Cardiovascular: Normal rate, regular rhythm, normal heart sounds and intact distal pulses.  Exam reveals no gallop and no friction rub.   No murmur heard. Pulmonary/Chest: Effort normal and breath sounds normal. No respiratory distress. He has no wheezes. He has no rales. He exhibits no tenderness.  Abdominal: Soft. Bowel sounds are normal. He exhibits no distension and no mass. There is no  tenderness. There is no rebound and no guarding.  Musculoskeletal: Normal range of motion. He exhibits no edema and no tenderness.  Lymphadenopathy:    He has no cervical adenopathy.  Neurological: He is alert and oriented to person, place, and time. He has normal reflexes. No cranial nerve deficit. He exhibits normal muscle tone. Coordination normal.  Skin: Skin is warm and dry. No rash noted.  Psychiatric: He has a normal mood and affect. His behavior is normal. Judgment and thought content normal.          Assessment & Plan:

## 2011-06-09 ENCOUNTER — Ambulatory Visit (AMBULATORY_SURGERY_CENTER): Payer: Medicare Other | Admitting: Internal Medicine

## 2011-06-09 ENCOUNTER — Encounter: Payer: Self-pay | Admitting: Internal Medicine

## 2011-06-09 VITALS — BP 171/97 | HR 81 | Temp 97.9°F | Resp 17 | Ht 71.0 in | Wt 180.0 lb

## 2011-06-09 DIAGNOSIS — Z1211 Encounter for screening for malignant neoplasm of colon: Secondary | ICD-10-CM

## 2011-06-09 DIAGNOSIS — Z86010 Personal history of colonic polyps: Secondary | ICD-10-CM

## 2011-06-09 DIAGNOSIS — K625 Hemorrhage of anus and rectum: Secondary | ICD-10-CM

## 2011-06-09 MED ORDER — SODIUM CHLORIDE 0.9 % IV SOLN: 500.0000 mL | INTRAVENOUS | Status: DC

## 2011-06-09 NOTE — Patient Instructions (Signed)
[facility] Discharge instructions reviewed with patient and care partner. [facility] Discharge instructions signed by care partner.  Impressions/Recommendations:  Diverticulosis Internal Hemorrhoids  Repeat colonoscopy 5 years (2017)  Continue medications as you were taking them prior to your procedure.

## 2011-06-10 ENCOUNTER — Telehealth: Payer: Self-pay | Admitting: *Deleted

## 2011-06-10 NOTE — Telephone Encounter (Signed)

## 2011-06-22 ENCOUNTER — Encounter (HOSPITAL_COMMUNITY): Payer: Medicare Other | Attending: Rheumatology

## 2011-06-22 DIAGNOSIS — M069 Rheumatoid arthritis, unspecified: Secondary | ICD-10-CM | POA: Insufficient documentation

## 2011-06-24 NOTE — Telephone Encounter (Signed)
error 

## 2011-07-06 ENCOUNTER — Encounter (HOSPITAL_COMMUNITY): Payer: Medicare Other

## 2011-07-06 ENCOUNTER — Encounter (HOSPITAL_COMMUNITY)
Admission: RE | Admit: 2011-07-06 | Discharge: 2011-07-06 | Payer: Medicare Other | Source: Ambulatory Visit | Attending: Rheumatology | Admitting: Rheumatology

## 2011-08-03 ENCOUNTER — Encounter (HOSPITAL_COMMUNITY): Payer: Medicare Other | Attending: Rheumatology

## 2011-08-03 DIAGNOSIS — M069 Rheumatoid arthritis, unspecified: Secondary | ICD-10-CM | POA: Insufficient documentation

## 2011-08-10 ENCOUNTER — Ambulatory Visit (INDEPENDENT_AMBULATORY_CARE_PROVIDER_SITE_OTHER): Payer: Medicare Other | Admitting: *Deleted

## 2011-08-10 DIAGNOSIS — Z23 Encounter for immunization: Secondary | ICD-10-CM

## 2011-08-31 ENCOUNTER — Inpatient Hospital Stay (HOSPITAL_COMMUNITY)
Admission: EM | Admit: 2011-08-31 | Discharge: 2011-09-01 | DRG: 313 | Disposition: A | Discharge status: Home / Self Care | Payer: Medicare Other | Attending: Internal Medicine | Admitting: Internal Medicine

## 2011-08-31 ENCOUNTER — Emergency Department (HOSPITAL_COMMUNITY): Payer: Medicare Other

## 2011-08-31 DIAGNOSIS — M069 Rheumatoid arthritis, unspecified: Secondary | ICD-10-CM | POA: Diagnosis present

## 2011-08-31 DIAGNOSIS — K219 Gastro-esophageal reflux disease without esophagitis: Secondary | ICD-10-CM | POA: Diagnosis present

## 2011-08-31 DIAGNOSIS — E876 Hypokalemia: Secondary | ICD-10-CM | POA: Diagnosis present

## 2011-08-31 DIAGNOSIS — Z87891 Personal history of nicotine dependence: Secondary | ICD-10-CM

## 2011-08-31 DIAGNOSIS — E785 Hyperlipidemia, unspecified: Secondary | ICD-10-CM | POA: Diagnosis present

## 2011-08-31 DIAGNOSIS — R079 Chest pain, unspecified: Secondary | ICD-10-CM

## 2011-08-31 DIAGNOSIS — I10 Essential (primary) hypertension: Secondary | ICD-10-CM | POA: Diagnosis present

## 2011-08-31 DIAGNOSIS — R0789 Other chest pain: Principal | ICD-10-CM | POA: Diagnosis present

## 2011-08-31 LAB — COMPREHENSIVE METABOLIC PANEL
ALT: 14 U/L (ref 0–53)
AST: 17 U/L (ref 0–37)
Albumin: 3.6 g/dL (ref 3.5–5.2)
Alkaline Phosphatase: 50 U/L (ref 39–117)
BUN: 15 mg/dL (ref 6–23)
CO2: 27 mEq/L (ref 19–32)
Calcium: 9.4 mg/dL (ref 8.4–10.5)
Chloride: 104 mEq/L (ref 96–112)
Creatinine, Ser: 0.82 mg/dL (ref 0.50–1.35)
GFR calc Af Amer: >90 mL/min (ref >90)
GFR calc non Af Amer: 84 mL/min — ABNORMAL LOW (ref >90)
Glucose, Bld: 109 mg/dL — ABNORMAL HIGH (ref 70–99)
Potassium: 3.7 mEq/L (ref 3.5–5.1)
Sodium: 139 mEq/L (ref 135–145)
Total Bilirubin: 0.6 mg/dL (ref 0.3–1.2)
Total Protein: 7.2 g/dL (ref 6.0–8.3)

## 2011-08-31 LAB — DIFFERENTIAL
Basophils Absolute: 0 10*3/uL (ref 0.0–0.1)
Basophils Relative: 0 % (ref 0–1)
Eosinophils Absolute: 0 10*3/uL (ref 0.0–0.7)
Eosinophils Relative: 1 % (ref 0–5)
Lymphocytes Relative: 26 % (ref 12–46)
Lymphs Abs: 1.4 10*3/uL (ref 0.7–4.0)
Monocytes Absolute: 0.3 10*3/uL (ref 0.1–1.0)
Monocytes Relative: 6 % (ref 3–12)
Neutro Abs: 3.7 10*3/uL (ref 1.7–7.7)
Neutrophils Relative %: 68 % (ref 43–77)

## 2011-08-31 LAB — CBC
HCT: 40.6 % (ref 39.0–52.0)
Hemoglobin: 13.9 g/dL (ref 13.0–17.0)
MCH: 32.1 pg (ref 26.0–34.0)
MCHC: 34.2 g/dL (ref 30.0–36.0)
MCV: 93.8 fL (ref 78.0–100.0)
Platelets: 266 10*3/uL (ref 150–400)
RBC: 4.33 MIL/uL (ref 4.22–5.81)
RDW: 13.7 % (ref 11.5–15.5)
WBC: 5.5 10*3/uL (ref 4.0–10.5)

## 2011-08-31 LAB — TROPONIN I: Troponin I: <0.3 ng/mL (ref <0.30)

## 2011-08-31 LAB — POCT I-STAT TROPONIN I: Troponin i, poc: 0 ng/mL (ref 0.00–0.08)

## 2011-08-31 LAB — CK TOTAL AND CKMB (NOT AT ARMC)
CK, MB: 3 ng/mL (ref 0.3–4.0)
CK, MB: 2.7 ng/mL (ref 0.3–4.0)
Relative Index: 2.4 (ref 0.0–2.5)
Relative Index: INVALID (ref 0.0–2.5)
Total CK: 124 U/L (ref 7–232)
Total CK: 98 U/L (ref 7–232)

## 2011-08-31 LAB — LIPASE, BLOOD: Lipase: 26 U/L (ref 11–59)

## 2011-09-01 ENCOUNTER — Telehealth: Payer: Self-pay | Admitting: *Deleted

## 2011-09-01 LAB — BASIC METABOLIC PANEL
BUN: 15 mg/dL (ref 6–23)
CO2: 25 mEq/L (ref 19–32)
Calcium: 8.9 mg/dL (ref 8.4–10.5)
Chloride: 105 mEq/L (ref 96–112)
Creatinine, Ser: 0.71 mg/dL (ref 0.50–1.35)
GFR calc Af Amer: >90 mL/min (ref >90)
GFR calc non Af Amer: 89 mL/min — ABNORMAL LOW (ref >90)
Glucose, Bld: 98 mg/dL (ref 70–99)
Potassium: 3.1 mEq/L — ABNORMAL LOW (ref 3.5–5.1)
Sodium: 139 mEq/L (ref 135–145)

## 2011-09-01 LAB — CK TOTAL AND CKMB (NOT AT ARMC)
CK, MB: 2.5 ng/mL (ref 0.3–4.0)
Relative Index: INVALID (ref 0.0–2.5)
Total CK: 76 U/L (ref 7–232)

## 2011-09-01 LAB — TROPONIN I: Troponin I: <0.3 ng/mL (ref <0.30)

## 2011-09-01 NOTE — Telephone Encounter (Signed)
Pt left vm stating he was recently d/c from [facility] hospital.  He was there because of severe indigestion. He had neg cardiac workup and has been scheduled for 09-13-11 OV with AVP. He is asking if Dr. Posey Rea thinks he should do/change anything he is currently doing. I called pt and spoke to his wife. I advised her that Dr. Posey Rea is out of the office until Monday, but that we will call him if Dr. Posey Rea advises any changes.

## 2011-09-02 NOTE — Telephone Encounter (Signed)
Increase Omeprazole to bid for now Thx

## 2011-09-02 NOTE — Telephone Encounter (Signed)
Pt informed

## 2011-09-09 NOTE — Discharge Summary (Addendum)
  NAMEHERRON, Todd NO.:  [id number]  MEDICAL RECORD NO.:  [id number]  LOCATION:  3733                         FACILITY:  [facility]  PHYSICIAN:  Hillis Range, MD       DATE OF BIRTH:  1933-08-28  DATE OF ADMISSION:  08/31/2011 DATE OF DISCHARGE:  09/01/2011                              DISCHARGE SUMMARY   DISCHARGE DIAGNOSES: 1. Atypical chest pain.     a.     Rule out myocardial infarction. 2. Rheumatoid arthritis, on immunomodulating therapy. 3. History of palpitations, documented as premature atrial     contractions. 4. Dyslipidemia, statin intolerant. 5. Acid reflux.  HOSPITAL COURSE:  [name] is a 77 year old gentleman with known history of coronary artery disease, who has a history of PACs, dyslipidemia, and rheumatoid arthritis.  He presented to [facility] with complaints of chest pain, substernal.  He remains active and exercises 3 days a week with no limitation.  He woke up with substernal mid-epigastric chest discomfort, gradually improved with morphine.  No associated shortness of breath, nausea, vomiting, or diaphoresis.  EKG demonstrated sinus rhythm with acute ST-T wave changes.  He was admitted to the hospital for MI rule out and cardiac enzymes remained negative x3.  Thus his chest pain was felt atypical in nature, with recommendations to continue medication for his acid reflux. He is on omeprazole chronically.  On day of discharge, the patient is feeling well.  Dr. Ladona Ridgel seen and examined him today and feels he is stable for discharge.  DISCHARGE LABS:  Sodium 139, potassium 2.1, for which she received potassium repletion, chloride 105, CO2 of 25, glucose 98, BUN 15, creatinine 0.71.  Cardiac enzymes negative x3.  Lipase negative.  STUDIES:  Chest x-ray, August 31, 2011, showed hyperexpanded lungs without acute infiltrate.  DISCHARGE MEDICATIONS: 1. Amlodipine 5 mg daily, which is a new medicine because of his blood  pressure. 2. Ambien 10 mg half tablet at bedtime p.r.n. sleep. 3. Arava 20 mg daily. 4. Artificial Tears 1 drop both eyes q.6 hours p.r.n. 5. Aspirin 81 mg daily. 6. Fish oil 2 capsules b.i.d. 7. Lorazepam 0.5 mg t.i.d. p.r.n. anxiety. 8. Omeprazole 20 mg daily. 9. Prednisone 10 mg daily. 10.Red yeast rice 1 tablet daily. 11.Remicade 1 injection IV every 2 months. 12.Viagra 100 mg half tablet daily as needed for erectile dysfunction. 13.Vitamin B12 one tablet daily.  DISPOSITION:  Mr. [name] will be discharge in stable condition to home.  DISCHARGE INSTRUCTIONS:  He is to follow a low-sodium, heart-healthy diet.  Increase activity slowly.  He will follow up with Dr. Johney Frame in 2 weeks per Dr. Lubertha Basque recommendation and our office will call him with this appointment.  DURATION OF DISCHARGE ENCOUNTER:  Greater than 30 minutes including physician and PA time.     [name], P.A.C.   ______________________________ Hillis Range, MD    DD/MEDQ  D:  09/01/2011  T:  09/02/2011  Job:  [id number]  cc:   Hillis Range, MD  Electronically Signed by [name]  on 09/09/2011 10:25:10 AM Electronically Signed by Hillis Range MD on 09/16/2011 02:32:54 PM

## 2011-09-12 NOTE — H&P (Signed)
NAMESULEYMAN, EHRMAN NO.:  [id number]  MEDICAL RECORD NO.:  [id number]  LOCATION:  3733                         FACILITY:  [facility]  PHYSICIAN:  Doylene Canning. Ladona Ridgel, MD    DATE OF BIRTH:  [birth year]  DATE OF ADMISSION:  08/31/2011 DATE OF DISCHARGE:                             HISTORY & PHYSICAL   ADMITTING DIAGNOSIS:  Atypical chest pain.  HISTORY OF PRESENT ILLNESS:  The patient is a very pleasant 77 year old male question with no history of coronary artery disease, who is admitted to the hospital with substernal chest pain.  The patient was initially seen by our group back 1 year ago when he presented with palpitations and fatigue.  He was observed and was found to have sinus tachycardia with multiple PACs.  The patient saw Dr. Johney Frame in our practice most recently in March.  At that time, he was doing well.  He has been bothered by problems with arthritis and has been on multiple medications for this.  The patient remains active.  He exercises vigorously 3 times a week and has had no limitation.  Earlier today, the patient woke up and experienced substernal and mid epigastric chest discomfort.  It gradually resolved improving with morphine.  He had no associated shortness of breath.  No nausea, no vomiting or diaphoresis. He has not had syncope, and he denies peripheral edema.  His arthritic complaints have been well controlled.  PAST MEDICAL HISTORY:  Notable for the diagnosis of rheumatoid arthritis for which he is on immunomodulating therapy, specifically Remicade and Arava.  The patient has a history of palpitations and documented PACs. He has a history of dyslipidemia and has been intolerant to statin therapy in the past.  SOCIAL HISTORY:  The patient is married and lives in [place].  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 He denies tobacco use currently, though he smoked remotely in the past. He has 1 alcoholic  beverage a week, now that he is no longer takes any methotrexate.  FAMILY HISTORY:  Notable for father died of an MI in his 63s.  REVIEW OF SYSTEMS:  All systems reviewed and negative except as noted in the HPI.  PHYSICAL EXAMINATION:  GENERAL:  He is a pleasant, well-appearing 72- year-old man in no acute distress VITAL SIGNS:  The blood pressure was 160/95, the pulse was 75 and regular, respirations were 12, temperature was 98. HEENT:  Normocephalic and atraumatic.  Pupils are equal and round. Oropharynx is moist.  Sclerae anicteric. NECK:  No jugular venous distention.  There is no thyromegaly.  Trachea is midline.  The carotids are 2+ and symmetric. LUNGS:  Clear bilaterally to auscultation.  No wheezes, rales, or rhonchi are present.  There is no increased work of breathing. CARDIOVASCULAR:  Regular rate and rhythm.  Normal S1 and S2.  No murmurs, rubs, or gallops. ABDOMEN:  Soft, nontender, and nondistended.  There is no organomegaly. Bowel sounds present.  No rebound or guarding. EXTREMITIES:  No cyanosis, clubbing, or edema.  The pulses are 2+ and symmetric. NEUROLOGIC:  Alert and oriented x3 with cranial nerves intact.  Strength is 5/5 and  symmetric.  The EKG demonstrates sinus rhythm with no acute ST-T-wave changes.  IMPRESSION: 1. Atypical chest pain. 2. History of palpitations. 3. Dyslipidemia. 4. Rheumatoid arthritis.  DISCUSSION:  We will plan to admit the patient to obtain serial EKGs and cardiac enzymes.  We will continue treatment for acid reflux.  We will monitor him on telemetry for his atrial ectopy.  Additional recommendations will pend the results of his blood work.  My inclination will be that he be randomized to strategy of early discharge and not any additional invasive cardiac workup.     Doylene Canning. Ladona Ridgel, MD     GWT/MEDQ  D:  08/31/2011  T:  09/01/2011  Job:  [id number]  cc:   Rosalyn Gess. Norins, MD Hillis Range, MD  Electronically Signed by  Lewayne Bunting MD on 09/12/2011 12:18:28 PM

## 2011-09-13 ENCOUNTER — Encounter: Payer: Self-pay | Admitting: Internal Medicine

## 2011-09-13 ENCOUNTER — Ambulatory Visit (INDEPENDENT_AMBULATORY_CARE_PROVIDER_SITE_OTHER): Payer: Medicare Other | Admitting: Internal Medicine

## 2011-09-13 DIAGNOSIS — R0789 Other chest pain: Secondary | ICD-10-CM | POA: Insufficient documentation

## 2011-09-13 DIAGNOSIS — E785 Hyperlipidemia, unspecified: Secondary | ICD-10-CM

## 2011-09-13 DIAGNOSIS — R072 Precordial pain: Secondary | ICD-10-CM

## 2011-09-13 DIAGNOSIS — E782 Mixed hyperlipidemia: Secondary | ICD-10-CM

## 2011-09-13 DIAGNOSIS — M069 Rheumatoid arthritis, unspecified: Secondary | ICD-10-CM

## 2011-09-13 MED ORDER — INFLIXIMAB 100 MG IV SOLR: INTRAVENOUS | Status: DC

## 2011-09-13 NOTE — Progress Notes (Signed)
  Subjective:    Patient ID: [name], male    DOB: 1933-06-06, 77 y.o.   MRN: [id number]  HPI  The patient presents for a follow-up of a new hypertension detected, chronic dyslipidemia, RA controlled with medicines. He was recently adm. w/CP to r/o MI  Wt Readings from Last 3 Encounters:  09/13/11 185 lb (83.915 kg)  06/09/11 180 lb (81.647 kg)  06/08/11 180 lb (81.647 kg)    Review of Systems  Constitutional: Negative for appetite change, fatigue and unexpected weight change.  HENT: Negative for nosebleeds, congestion, sore throat, sneezing, trouble swallowing and neck pain.   Eyes: Negative for itching and visual disturbance.  Respiratory: Negative for cough and shortness of breath.   Cardiovascular: Negative for chest pain, palpitations and leg swelling.  Gastrointestinal: Negative for nausea, diarrhea, blood in stool and abdominal distention.  Genitourinary: Negative for frequency and hematuria.  Musculoskeletal: Positive for arthralgias. Negative for back pain, joint swelling and gait problem.  Skin: Negative for rash.  Neurological: Negative for dizziness, tremors, speech difficulty and weakness.  Psychiatric/Behavioral: Negative for sleep disturbance, dysphoric mood and agitation. The patient is not nervous/anxious.        Objective:   Physical Exam  Constitutional: He is oriented to person, place, and time. He appears well-developed.  HENT:  Mouth/Throat: Oropharynx is clear and moist.  Eyes: Conjunctivae are normal. Pupils are equal, round, and reactive to light.  Neck: Normal range of motion. No JVD present. No thyromegaly present.  Cardiovascular: Normal rate, regular rhythm, normal heart sounds and intact distal pulses.  Exam reveals no gallop and no friction rub.   No murmur heard. Pulmonary/Chest: Effort normal and breath sounds normal. No respiratory distress. He has no wheezes. He has no rales. He exhibits no tenderness.  Abdominal: Soft. Bowel sounds are  normal. He exhibits no distension and no mass. There is no tenderness. There is no rebound and no guarding.  Musculoskeletal: Normal range of motion. He exhibits tenderness. He exhibits no edema.  Lymphadenopathy:    He has no cervical adenopathy.  Neurological: He is alert and oriented to person, place, and time. He has normal reflexes. No cranial nerve deficit. He exhibits normal muscle tone. Coordination normal.  Skin: Skin is warm and dry. No rash noted.  Psychiatric: He has a normal mood and affect. His behavior is normal. Judgment and thought content normal.    Hosp. D/c, labs and tests were reviewed      Assessment & Plan:

## 2011-09-13 NOTE — Assessment & Plan Note (Signed)
On  Red Rice yeast 

## 2011-09-13 NOTE — Assessment & Plan Note (Signed)
Continue with current prescription therapy as reflected on the Med list. On Remicade now q 2 mo

## 2011-09-13 NOTE — Assessment & Plan Note (Signed)
10/12 MI ruled out admission Card f/u is pending

## 2011-09-13 NOTE — Assessment & Plan Note (Signed)
On Red rice yeast 

## 2011-09-23 ENCOUNTER — Encounter: Payer: Self-pay | Admitting: Internal Medicine

## 2011-09-23 ENCOUNTER — Ambulatory Visit (INDEPENDENT_AMBULATORY_CARE_PROVIDER_SITE_OTHER): Payer: Medicare Other | Admitting: Internal Medicine

## 2011-09-23 VITALS — BP 138/90 | HR 84 | Ht 72.0 in | Wt 184.0 lb

## 2011-09-23 DIAGNOSIS — I10 Essential (primary) hypertension: Secondary | ICD-10-CM

## 2011-09-23 DIAGNOSIS — E782 Mixed hyperlipidemia: Secondary | ICD-10-CM

## 2011-09-23 DIAGNOSIS — R072 Precordial pain: Secondary | ICD-10-CM

## 2011-09-23 NOTE — Assessment & Plan Note (Signed)
Above goal He will obtain a blood pressure cuff and monitor his BP closely at home. If elevated, we will increase norvasc

## 2011-09-23 NOTE — Assessment & Plan Note (Signed)
Recent lipids are stable on red yeast rice No changes

## 2011-09-23 NOTE — Assessment & Plan Note (Signed)
Recent hospitalization for chest pain Symptoms are improved Given age, htn, and prior tobacco, I think that further risk stratification is required.  He will have a GXT myoview.  If low risk, then he may resume normal activity.

## 2011-09-23 NOTE — Progress Notes (Signed)
The patient presents today for routine electrophysiology followup.  Since his recent hospitalization, the patient reports doing reasonably well.  He had had no further chest pain, but remains concerned about the possibility of CAD as a cause.  Today, he denies symptoms of palpitations, chest pain, shortness of breath, orthopnea, PND, lower extremity edema, dizziness, presyncope, syncope, or neurologic sequela.  The patient feels that he is tolerating medications without difficulties and is otherwise without complaint today.   Past Medical History  Diagnosis Date  . Colon polyp   . Hyperlipidemia   . Osteoarthritis   . Migraine with aura   . RA (rheumatoid arthritis)   . Premature atrial contractions   . GERD (gastroesophageal reflux disease)   . Hypertension    Past Surgical History  Procedure Date  . Back surgery 2004    Current Outpatient Prescriptions  Medication Sig Dispense Refill  . amLODipine (NORVASC) 5 MG tablet Take 5 mg by mouth daily.        [name] aspirin 325 MG tablet Take 325 mg by mouth daily.        . Cholecalciferol (VITAMIN D) 1000 UNITS capsule Take 1,000 Units by mouth daily.        . Cyanocobalamin 2500 MCG SUBL Place 1 tablet under the tongue daily.        . fexofenadine (ALLEGRA) 180 MG tablet Take 180 mg by mouth daily.        . fish oil-omega-3 fatty acids 1000 MG capsule Take 2 g by mouth daily.        [name] inFLIXimab (REMICADE) 100 MG injection q 2 mo  1 each  0  . leflunomide (ARAVA) 20 MG tablet Take 20 mg by mouth daily.        [name] LORazepam (ATIVAN) 0.5 MG tablet Take 1-2 tablets (0.5-1 mg total) by mouth 2 (two) times daily as needed for anxiety.  60 tablet  5  . omeprazole (PRILOSEC) 20 MG capsule Take 40 mg by mouth daily.        . predniSONE (DELTASONE) 5 MG tablet Take 5 mg by mouth. As directed can take up to 10 mg once daily       . Red Yeast Rice 600 MG CAPS Take 1 capsule by mouth daily.        . sildenafil (VIAGRA) 100 MG tablet Take 100 mg by mouth  daily as needed.        . zolpidem (AMBIEN) 10 MG tablet Take 10 mg by mouth at bedtime as needed.        [name] DISCONTD: omeprazole (PRILOSEC) 20 MG capsule Take 1 capsule (20 mg total) by mouth daily.  90 capsule  3   Current Facility-Administered Medications  Medication Dose Route Frequency Provider Last Rate Last Dose  . 0.9 %  sodium chloride infusion  500 mL Intravenous Continuous Yancey Flemings, MD        Allergies  Allergen Reactions  . Atorvastatin   . Methotrexate     REACTION: tachycardia  . Pravastatin Sodium     REACTION: cramps, fatigue  . Rosuvastatin     History   Social History  . Marital Status: Married    Spouse Name: N/A    Number of Children: 2  . Years of Education: N/A   Occupational History  . Retired    Social History Main Topics  . Smoking status: Former Games developer  . Smokeless tobacco: Never Used  . Alcohol Use: 0.0 oz/week    7-14  Glasses of wine, 2-3 Drinks containing 0.5 oz of alcohol per week     1-2 daily   . Drug Use: No  . Sexually Active: Yes   Other Topics Concern  . Not on file   Social History Narrative   1 Caffeine drink daily     Family History  Problem Relation Age of Onset  . Colon cancer Neg Hx   . Hypertension Mother     ROS-  All systems are reviewed and are negative except as outlined in the HPI above   Physical Exam: Filed Vitals:   09/23/11 1006  BP: 138/90  Pulse: 84  Height: 6' (1.829 m)  Weight: 184 lb (83.462 kg)    GEN- The patient is well appearing, alert and oriented x 3 today.   Head- normocephalic, atraumatic Eyes-  Sclera clear, conjunctiva pink Ears- hearing intact Oropharynx- clear Neck- supple, no JVP Lymph- no cervical lymphadenopathy Lungs- Clear to ausculation bilaterally, normal work of breathing Heart- Regular rate and rhythm, no murmurs, rubs or gallops, PMI not laterally displaced GI- soft, NT, ND, + BS Extremities- no clubbing, cyanosis, or edema MS- no significant deformity or  atrophy Skin- no rash or lesion Psych- euthymic mood, full affect Neuro- strength and sensation are intact  ekg today reveals sinus rhythm 84 bpm, normal ekg  Assessment and Plan:

## 2011-09-23 NOTE — Patient Instructions (Addendum)
Your physician wants you to follow-up in: 12 months with Dr Jacquiline Doe will receive a reminder letter in the mail two months in advance. If you don't receive a letter, please call our office to schedule the follow-up appointment.   Your physician has requested that you have en exercise stress myoview. For further information please visit [email or web address] Please follow instruction sheet, as given.  Please get a Blood pressure cuff and record reading and bring to next appointment

## 2011-09-27 ENCOUNTER — Other Ambulatory Visit (HOSPITAL_COMMUNITY): Payer: Self-pay | Admitting: *Deleted

## 2011-09-28 ENCOUNTER — Encounter (HOSPITAL_COMMUNITY)
Admission: RE | Admit: 2011-09-28 | Discharge: 2011-09-28 | Disposition: A | Discharge status: Home / Self Care | Payer: Medicare Other | Source: Ambulatory Visit | Attending: Rheumatology | Admitting: Rheumatology

## 2011-09-28 DIAGNOSIS — M069 Rheumatoid arthritis, unspecified: Secondary | ICD-10-CM | POA: Insufficient documentation

## 2011-09-28 MED ORDER — SODIUM CHLORIDE 0.9 % IV SOLN
3.0000 mg/kg | INTRAVENOUS | Status: DC
  Administered 2011-09-28: 200 mg via INTRAVENOUS
  Filled 2011-09-28: qty 20

## 2011-09-30 ENCOUNTER — Ambulatory Visit (HOSPITAL_COMMUNITY): Payer: Medicare Other | Attending: Cardiovascular Disease | Admitting: Radiology

## 2011-09-30 ENCOUNTER — Encounter (HOSPITAL_COMMUNITY): Payer: Medicare Other | Admitting: Radiology

## 2011-09-30 VITALS — Ht 72.0 in | Wt 182.0 lb

## 2011-09-30 DIAGNOSIS — E785 Hyperlipidemia, unspecified: Secondary | ICD-10-CM | POA: Insufficient documentation

## 2011-09-30 DIAGNOSIS — R5381 Other malaise: Secondary | ICD-10-CM | POA: Insufficient documentation

## 2011-09-30 DIAGNOSIS — I10 Essential (primary) hypertension: Secondary | ICD-10-CM

## 2011-09-30 DIAGNOSIS — R0789 Other chest pain: Secondary | ICD-10-CM

## 2011-09-30 DIAGNOSIS — Z8249 Family history of ischemic heart disease and other diseases of the circulatory system: Secondary | ICD-10-CM | POA: Insufficient documentation

## 2011-09-30 DIAGNOSIS — Z87891 Personal history of nicotine dependence: Secondary | ICD-10-CM | POA: Insufficient documentation

## 2011-09-30 DIAGNOSIS — R5383 Other fatigue: Secondary | ICD-10-CM | POA: Insufficient documentation

## 2011-09-30 MED ORDER — TECHNETIUM TC 99M TETROFOSMIN IV KIT
33.0000 | PACK | Freq: Once | INTRAVENOUS | Status: AC | PRN
  Administered 2011-09-30: 33 via INTRAVENOUS

## 2011-09-30 MED ORDER — TECHNETIUM TC 99M TETROFOSMIN IV KIT
11.0000 | PACK | Freq: Once | INTRAVENOUS | Status: AC | PRN
  Administered 2011-09-30: 11 via INTRAVENOUS

## 2011-09-30 NOTE — Progress Notes (Signed)
[facility], [facility], [facility] SITE 3 NUCLEAR MED [address] [place] [address] [phone]  Cardiology Nuclear Med Study  [name] is a 77 y.o. male [id number] 1933/02/19   Nuclear Med Background Indication for Stress Test:  Evaluation for Ischemia and 09/06/11 Post Hospital:CP, (-) enzymes History:  '09 ION:GEXBMW, EF=65%; '11 Echo:EF=55-60% Cardiac Risk Factors: Family History - CAD, History of Smoking, Hypertension and Lipids  Symptoms:  Chest Pressure.  (last episode of chest discomfort was about a week ago) and Fatigue   Nuclear Pre-Procedure Caffeine/Decaff Intake:  None NPO After: 7:00pm   Lungs:  Clear. IV 0.9% NS with Angio Cath:  20g  IV Site: R Antecubital  IV Started by:  Stanton Kidney, EMT-P  Chest Size (in):  43 Cup Size: n/a  Height: 6' (1.829 m)  Weight:  182 lb (82.555 kg)  BMI:  Body mass index is 24.68 kg/(m^2). Tech Comments:  Takes BP meds. usually around 12 pm    Nuclear Med Study 1 or 2 day study: 1 day  Stress Test Type:  Stress  Reading MD: Charlton Haws, MD  Order Authorizing Provider:  Hillis Range, MD  Resting Radionuclide: Technetium 58m Tetrofosmin  Resting Radionuclide Dose: 11 mCi   Stress Radionuclide:  Technetium 69m Tetrofosmin  Stress Radionuclide Dose: 33 mCi           Stress Protocol Rest HR: 75 Stress HR: 150  Rest BP: Sitting:146/93  Standing:149/94 Stress BP: 220/94  Exercise Time (min): 5:22 METS: 7.0   Predicted Max HR: 144 bpm % Max HR: 104.17 bpm Rate Pressure Product: 41324   Dose of Adenosine (mg):  n/a Dose of Lexiscan: n/a mg  Dose of Atropine (mg): n/a Dose of Dobutamine: n/a mcg/kg/min (at max HR)  Stress Test Technologist: Smiley Houseman, CMA-N  Nuclear Technologist:  Domenic Polite, CNMT     Rest Procedure:  Myocardial perfusion imaging was performed at rest 45 minutes following the intravenous administration of Technetium 57m Tetrofosmin.  Rest ECG: No acute changes.  Stress Procedure:  The patient  exercised for 5:22 on the treadmill utilizing the Bruce protocol.  The patient stopped due to fatigue and a hypertensive response, 220/94.  He denied any chest pain.  There were no diagnostic ST-T wave changes, rare PVC's and PAC's.  Technetium 31m Tetrofosmin was injected at peak exercise and myocardial perfusion imaging was performed after a brief delay.  Stress ECG: No significant change from baseline ECG  QPS Raw Data Images:  Normal; no motion artifact; normal heart/lung ratio. Stress Images:  Normal homogeneous uptake in all areas of the myocardium. Rest Images:  Normal homogeneous uptake in all areas of the myocardium. Subtraction (SDS):  Normal Transient Ischemic Dilatation (Normal <1.22):  .93 Lung/Heart Ratio (Normal <0.45):  .28  Quantitative Gated Spect Images QGS EDV:  74 ml QGS ESV:  28 ml QGS cine images:  NL LV Function; NL Wall Motion QGS EF: 61%  Impression Exercise Capacity:  Fair exercise capacity. BP Response:  Hypertensive blood pressure response. Clinical Symptoms:  No chest pain. ECG Impression:  No significant ST segment change suggestive of ischemia. Comparison with Prior Nuclear Study: No images to compare  Overall Impression:  Normal stress nuclear study. Hypertensive response to low level exercise  EF 61%  Charlton Haws

## 2011-10-05 ENCOUNTER — Telehealth: Payer: Self-pay | Admitting: Internal Medicine

## 2011-10-05 NOTE — Telephone Encounter (Signed)
Spoke with patient regarding stress test results.

## 2011-10-05 NOTE — Telephone Encounter (Signed)
Pt calling re stress test last Thursday, wants results

## 2011-10-18 ENCOUNTER — Ambulatory Visit: Payer: Medicare Other | Admitting: Internal Medicine

## 2011-10-22 ENCOUNTER — Ambulatory Visit (INDEPENDENT_AMBULATORY_CARE_PROVIDER_SITE_OTHER): Payer: Medicare Other | Admitting: Internal Medicine

## 2011-10-22 ENCOUNTER — Encounter: Payer: Self-pay | Admitting: Internal Medicine

## 2011-10-22 VITALS — BP 130/70 | HR 80 | Temp 97.9°F | Resp 16 | Wt 188.0 lb

## 2011-10-22 DIAGNOSIS — E782 Mixed hyperlipidemia: Secondary | ICD-10-CM

## 2011-10-22 DIAGNOSIS — I10 Essential (primary) hypertension: Secondary | ICD-10-CM

## 2011-10-22 DIAGNOSIS — Z23 Encounter for immunization: Secondary | ICD-10-CM

## 2011-10-22 DIAGNOSIS — R002 Palpitations: Secondary | ICD-10-CM

## 2011-10-22 DIAGNOSIS — R5381 Other malaise: Secondary | ICD-10-CM

## 2011-10-22 DIAGNOSIS — M069 Rheumatoid arthritis, unspecified: Secondary | ICD-10-CM

## 2011-10-22 DIAGNOSIS — M2550 Pain in unspecified joint: Secondary | ICD-10-CM

## 2011-10-22 DIAGNOSIS — R5383 Other fatigue: Secondary | ICD-10-CM

## 2011-10-22 MED ORDER — TRAMADOL HCL 50 MG PO TABS: 50.0000 mg | ORAL_TABLET | Freq: Two times a day (BID) | ORAL | Status: DC | PRN

## 2011-10-22 NOTE — Assessment & Plan Note (Signed)
Chronic RA related

## 2011-10-22 NOTE — Assessment & Plan Note (Signed)
Chronic - related to RA, meds

## 2011-10-22 NOTE — Progress Notes (Signed)
  Subjective:    Patient ID: [name], male    DOB: [birth year], 77 y.o.   MRN: [id number]  HPI  The patient presents for a follow-up of  chronic hypertension, chronic dyslipidemia, RA, CAD controlled some with medicines Pains 2-3/10 Sometimes he has pain 5-5/10 - -- - 1/wk     Review of Systems  Constitutional: Positive for fatigue. Negative for appetite change and unexpected weight change.  HENT: Negative for nosebleeds, congestion, sore throat, sneezing, trouble swallowing and neck pain.   Eyes: Negative for itching and visual disturbance.  Respiratory: Negative for cough.   Cardiovascular: Negative for chest pain, palpitations and leg swelling.  Gastrointestinal: Negative for nausea, diarrhea, blood in stool and abdominal distention.  Genitourinary: Negative for frequency and hematuria.  Musculoskeletal: Positive for back pain and arthralgias. Negative for joint swelling and gait problem.  Skin: Negative for rash.  Neurological: Negative for dizziness, tremors, speech difficulty and weakness.  Psychiatric/Behavioral: Negative for sleep disturbance, dysphoric mood and agitation. The patient is not nervous/anxious.   All other systems reviewed and are negative.       Objective:   Physical Exam  Constitutional: He is oriented to person, place, and time. He appears well-developed.  HENT:  Mouth/Throat: Oropharynx is clear and moist.  Eyes: Conjunctivae are normal. Pupils are equal, round, and reactive to light.  Neck: Normal range of motion. No JVD present. No thyromegaly present.  Cardiovascular: Normal rate, regular rhythm, normal heart sounds and intact distal pulses.  Exam reveals no gallop and no friction rub.   No murmur heard. Pulmonary/Chest: Effort normal and breath sounds normal. No respiratory distress. He has no wheezes. He has no rales. He exhibits no tenderness.  Abdominal: Soft. Bowel sounds are normal. He exhibits no distension and no mass. There is no  tenderness. There is no rebound and no guarding.  Musculoskeletal: Normal range of motion. He exhibits no edema and no tenderness.  Lymphadenopathy:    He has no cervical adenopathy.  Neurological: He is alert and oriented to person, place, and time. He has normal reflexes. No cranial nerve deficit. He exhibits normal muscle tone. Coordination normal.  Skin: Skin is warm and dry. No rash noted.  Psychiatric: He has a normal mood and affect. His behavior is normal. Judgment and thought content normal.          Assessment & Plan:

## 2011-10-22 NOTE — Assessment & Plan Note (Signed)
On RRY

## 2011-10-22 NOTE — Patient Instructions (Signed)
Start taking a yoga class - low impact Try Tramadol for pain

## 2011-10-22 NOTE — Assessment & Plan Note (Signed)
Continue with current prescription therapy as reflected on the Med list.  

## 2011-10-22 NOTE — Assessment & Plan Note (Signed)
2011 Dr Dareen Piano - mostly large joints On Remicade now q 2 mo

## 2011-11-07 ENCOUNTER — Other Ambulatory Visit: Payer: Self-pay | Admitting: Internal Medicine

## 2011-11-08 NOTE — Telephone Encounter (Signed)
Lorazepam request [last refill 04.25.12 #[id number]]

## 2011-11-10 ENCOUNTER — Other Ambulatory Visit: Payer: Self-pay | Admitting: *Deleted

## 2011-11-10 MED ORDER — LORAZEPAM 0.5 MG PO TABS: 0.5000 mg | ORAL_TABLET | Freq: Two times a day (BID) | ORAL | Status: DC | PRN

## 2011-11-10 NOTE — Progress Notes (Signed)
Addended by: Merrilyn Puma on: 11/10/2011 03:35 PM   Modules accepted: Orders

## 2011-11-19 ENCOUNTER — Other Ambulatory Visit: Payer: Self-pay | Admitting: Rheumatology

## 2011-11-19 ENCOUNTER — Other Ambulatory Visit (HOSPITAL_COMMUNITY): Payer: Self-pay | Admitting: *Deleted

## 2011-11-22 MED ORDER — SODIUM CHLORIDE 0.9 % IV SOLN: 3.0000 mg/kg | INTRAVENOUS | Status: DC

## 2011-11-23 ENCOUNTER — Encounter (HOSPITAL_COMMUNITY)
Admission: RE | Admit: 2011-11-23 | Discharge: 2011-11-23 | Disposition: A | Discharge status: Home / Self Care | Payer: Medicare Other | Source: Ambulatory Visit | Attending: Rheumatology | Admitting: Rheumatology

## 2011-11-23 DIAGNOSIS — M069 Rheumatoid arthritis, unspecified: Secondary | ICD-10-CM | POA: Insufficient documentation

## 2011-11-23 MED ORDER — SODIUM CHLORIDE 0.9 % IV SOLN
5.0000 mg/kg | INTRAVENOUS | Status: DC
  Administered 2011-11-23: 400 mg via INTRAVENOUS
  Filled 2011-11-23: qty 40

## 2011-12-07 ENCOUNTER — Telehealth: Payer: Self-pay

## 2011-12-07 NOTE — Telephone Encounter (Signed)
Patient called to inquire on bill regarding tdap injection charge. Informed to call [phone] to assist in question.

## 2012-01-17 ENCOUNTER — Telehealth: Payer: Self-pay | Admitting: *Deleted

## 2012-01-17 MED ORDER — ZOLPIDEM TARTRATE 10 MG PO TABS: 10.0000 mg | ORAL_TABLET | Freq: Every evening | ORAL | Status: DC | PRN

## 2012-01-17 NOTE — Telephone Encounter (Signed)
Done hardcopy to robin  

## 2012-01-17 NOTE — Telephone Encounter (Signed)
Rf req for Zolpidem 10 mg 1 po qhs. # 90. Ok to Rf in AVP's absence?

## 2012-01-18 ENCOUNTER — Encounter (HOSPITAL_COMMUNITY)
Admission: RE | Admit: 2012-01-18 | Discharge: 2012-01-18 | Disposition: A | Discharge status: Home / Self Care | Payer: Medicare Other | Source: Ambulatory Visit | Attending: Rheumatology | Admitting: Rheumatology

## 2012-01-18 DIAGNOSIS — M069 Rheumatoid arthritis, unspecified: Secondary | ICD-10-CM | POA: Insufficient documentation

## 2012-01-18 MED ORDER — SODIUM CHLORIDE 0.9 % IV SOLN: 5.0000 mg/kg | INTRAVENOUS | Status: DC

## 2012-01-18 MED ORDER — SODIUM CHLORIDE 0.9 % IV SOLN
5.0000 mg/kg | INTRAVENOUS | Status: DC
  Administered 2012-01-18: 400 mg via INTRAVENOUS
  Filled 2012-01-18: qty 40

## 2012-01-18 MED ORDER — SODIUM CHLORIDE 0.9 % IV SOLN
INTRAVENOUS | Status: DC
  Administered 2012-01-18: 09:00:00 via INTRAVENOUS

## 2012-01-18 NOTE — Telephone Encounter (Signed)
Called patient informed faxed hardcopy to [employer].

## 2012-01-20 ENCOUNTER — Ambulatory Visit: Payer: Medicare Other | Admitting: Internal Medicine

## 2012-02-03 ENCOUNTER — Other Ambulatory Visit: Payer: Self-pay

## 2012-02-03 MED ORDER — OMEPRAZOLE 20 MG PO CPDR: 40.0000 mg | DELAYED_RELEASE_CAPSULE | Freq: Every day | ORAL | Status: DC

## 2012-02-07 ENCOUNTER — Telehealth: Payer: Self-pay

## 2012-02-07 DIAGNOSIS — R109 Unspecified abdominal pain: Secondary | ICD-10-CM

## 2012-02-07 NOTE — Telephone Encounter (Signed)
Pt called stating he has been experiencing sharp pain just below his rib cage that causes profuse sweating and nausea. Pt is contributing sxs to kidney stones per suggestion of church nurse and is requesting MD advisement.

## 2012-02-07 NOTE — Telephone Encounter (Signed)
i called him  l flank pain x 30 min Labs tomorrow May need a ct abd

## 2012-02-08 ENCOUNTER — Telehealth: Payer: Self-pay | Admitting: Internal Medicine

## 2012-02-08 ENCOUNTER — Other Ambulatory Visit (INDEPENDENT_AMBULATORY_CARE_PROVIDER_SITE_OTHER): Payer: Medicare Other

## 2012-02-08 DIAGNOSIS — R109 Unspecified abdominal pain: Secondary | ICD-10-CM

## 2012-02-08 DIAGNOSIS — E785 Hyperlipidemia, unspecified: Secondary | ICD-10-CM

## 2012-02-08 LAB — BASIC METABOLIC PANEL
BUN: 15 mg/dL (ref 6–23)
CO2: 29 mEq/L (ref 19–32)
Calcium: 9 mg/dL (ref 8.4–10.5)
Chloride: 104 mEq/L (ref 96–112)
Creatinine, Ser: 0.9 mg/dL (ref 0.4–1.5)
GFR: 88.22 mL/min (ref >60.00)
Glucose, Bld: 95 mg/dL (ref 70–99)
Potassium: 3.8 mEq/L (ref 3.5–5.1)
Sodium: 140 mEq/L (ref 135–145)

## 2012-02-08 LAB — LIPID PANEL
Cholesterol: 273 mg/dL — ABNORMAL HIGH (ref 0–200)
HDL: 54.3 mg/dL (ref >39.00)
Total CHOL/HDL Ratio: 5
Triglycerides: 216 mg/dL — ABNORMAL HIGH (ref 0.0–149.0)
VLDL: 43.2 mg/dL — ABNORMAL HIGH (ref 0.0–40.0)

## 2012-02-08 LAB — URINALYSIS
Bilirubin Urine: NEGATIVE
Hgb urine dipstick: NEGATIVE
Ketones, ur: NEGATIVE
Leukocytes, UA: NEGATIVE
Nitrite: NEGATIVE
Specific Gravity, Urine: 1.025 (ref 1.000–1.030)
Total Protein, Urine: NEGATIVE
Urine Glucose: NEGATIVE
Urobilinogen, UA: 0.2 (ref 0.0–1.0)
pH: 6 (ref 5.0–8.0)

## 2012-02-08 LAB — CBC WITH DIFFERENTIAL/PLATELET
Basophils Absolute: 0.1 10*3/uL (ref 0.0–0.1)
Basophils Relative: 1.1 % (ref 0.0–3.0)
Eosinophils Absolute: 0.1 10*3/uL (ref 0.0–0.7)
Eosinophils Relative: 2.3 % (ref 0.0–5.0)
HCT: 42.6 % (ref 39.0–52.0)
Hemoglobin: 14.4 g/dL (ref 13.0–17.0)
Lymphocytes Relative: 43.7 % (ref 12.0–46.0)
Lymphs Abs: 2.2 10*3/uL (ref 0.7–4.0)
MCHC: 33.9 g/dL (ref 30.0–36.0)
MCV: 95.1 fl (ref 78.0–100.0)
Monocytes Absolute: 0.6 10*3/uL (ref 0.1–1.0)
Monocytes Relative: 10.8 % (ref 3.0–12.0)
Neutro Abs: 2.2 10*3/uL (ref 1.4–7.7)
Neutrophils Relative %: 42.1 % — ABNORMAL LOW (ref 43.0–77.0)
Platelets: 287 10*3/uL (ref 150.0–400.0)
RBC: 4.48 Mil/uL (ref 4.22–5.81)
RDW: 14.1 % (ref 11.5–14.6)
WBC: 5.1 10*3/uL (ref 4.5–10.5)

## 2012-02-08 LAB — SEDIMENTATION RATE: Sed Rate: 9 mm/hr (ref 0–22)

## 2012-02-08 LAB — LDL CHOLESTEROL, DIRECT: Direct LDL: 181 mg/dL

## 2012-02-08 NOTE — Telephone Encounter (Signed)
Misty Stanley, please, inform patient that all labs are normal except for elev chol We will sch abd CT - ordered OV next wk -- Mon or Tue... Thx

## 2012-02-09 NOTE — Telephone Encounter (Signed)
Left detailed mess informing pt of below.  

## 2012-02-10 ENCOUNTER — Telehealth: Payer: Self-pay | Admitting: Internal Medicine

## 2012-02-10 ENCOUNTER — Ambulatory Visit (INDEPENDENT_AMBULATORY_CARE_PROVIDER_SITE_OTHER)
Admission: RE | Admit: 2012-02-10 | Discharge: 2012-02-10 | Disposition: A | Discharge status: Home / Self Care | Payer: Medicare Other | Source: Ambulatory Visit | Attending: Cardiology | Admitting: Cardiology

## 2012-02-10 DIAGNOSIS — R109 Unspecified abdominal pain: Secondary | ICD-10-CM

## 2012-02-10 NOTE — Telephone Encounter (Signed)
Please, inform patient that his CT shows L kidney stone and some other chronic issues OV next wk - Mon or Tue pls Thx

## 2012-02-14 ENCOUNTER — Ambulatory Visit (INDEPENDENT_AMBULATORY_CARE_PROVIDER_SITE_OTHER): Payer: Medicare Other | Admitting: Internal Medicine

## 2012-02-14 ENCOUNTER — Encounter: Payer: Self-pay | Admitting: Internal Medicine

## 2012-02-14 VITALS — BP 130/68 | Resp 20 | Wt 185.0 lb

## 2012-02-14 DIAGNOSIS — R209 Unspecified disturbances of skin sensation: Secondary | ICD-10-CM

## 2012-02-14 DIAGNOSIS — M2550 Pain in unspecified joint: Secondary | ICD-10-CM

## 2012-02-14 DIAGNOSIS — R10A2 Flank pain, left side: Secondary | ICD-10-CM

## 2012-02-14 DIAGNOSIS — M069 Rheumatoid arthritis, unspecified: Secondary | ICD-10-CM

## 2012-02-14 DIAGNOSIS — R109 Unspecified abdominal pain: Secondary | ICD-10-CM | POA: Insufficient documentation

## 2012-02-14 DIAGNOSIS — R202 Paresthesia of skin: Secondary | ICD-10-CM

## 2012-02-14 DIAGNOSIS — R7309 Other abnormal glucose: Secondary | ICD-10-CM

## 2012-02-14 DIAGNOSIS — E559 Vitamin D deficiency, unspecified: Secondary | ICD-10-CM

## 2012-02-14 MED ORDER — KETOROLAC TROMETHAMINE 10 MG PO TABS: 10.0000 mg | ORAL_TABLET | Freq: Four times a day (QID) | ORAL | Status: AC | PRN

## 2012-02-14 MED ORDER — MEPERIDINE HCL 50 MG PO TABS: 50.0000 mg | ORAL_TABLET | ORAL | Status: DC | PRN

## 2012-02-14 MED ORDER — ATORVASTATIN CALCIUM 10 MG PO TABS: 10.0000 mg | ORAL_TABLET | Freq: Every day | ORAL | Status: DC

## 2012-02-14 NOTE — Assessment & Plan Note (Signed)
3/13  X episode -- likely due to a kidney stone

## 2012-02-14 NOTE — Assessment & Plan Note (Signed)
Will watch 

## 2012-02-14 NOTE — Assessment & Plan Note (Signed)
Continue with current prescription therapy as reflected on the Med list. Tramadol prn

## 2012-02-14 NOTE — Assessment & Plan Note (Signed)
Continue with current prescription therapy as reflected on the Med list.  

## 2012-02-14 NOTE — Progress Notes (Signed)
Patient ID: [name], male   DOB: 02/07/1933, 78 y.o.   MRN: [id number]  Subjective:    Patient ID: [name], male    DOB: [birth year], 78 y.o.   MRN: [id number]  HPI C/o L flank 2 episodes of severe 30 min pain with profuse sweating 1 mo apart; it would resolve completely.  The patient presents for a follow-up of  chronic hypertension, chronic dyslipidemia, RA, CAD controlled some with medicines Pains 2-3/10 Sometimes he has pain 5-5/10 - -- - 1/wk     Review of Systems  Constitutional: Positive for fatigue. Negative for appetite change and unexpected weight change.  HENT: Negative for nosebleeds, congestion, sore throat, sneezing, trouble swallowing and neck pain.   Eyes: Negative for itching and visual disturbance.  Respiratory: Negative for cough.   Cardiovascular: Negative for chest pain, palpitations and leg swelling.  Gastrointestinal: Negative for nausea, diarrhea, blood in stool and abdominal distention.  Genitourinary: Negative for frequency and hematuria.  Musculoskeletal: Positive for back pain and arthralgias. Negative for joint swelling and gait problem.  Skin: Negative for rash.  Neurological: Negative for dizziness, tremors, speech difficulty and weakness.  Psychiatric/Behavioral: Negative for sleep disturbance, dysphoric mood and agitation. The patient is not nervous/anxious.   All other systems reviewed and are negative.       Objective:   Physical Exam  Constitutional: He is oriented to person, place, and time. He appears well-developed.  HENT:  Mouth/Throat: Oropharynx is clear and moist.  Eyes: Conjunctivae are normal. Pupils are equal, round, and reactive to light.  Neck: Normal range of motion. No JVD present. No thyromegaly present.  Cardiovascular: Normal rate, regular rhythm, normal heart sounds and intact distal pulses.  Exam reveals no gallop and no friction rub.   No murmur heard. Pulmonary/Chest: Effort normal and breath sounds normal. No  respiratory distress. He has no wheezes. He has no rales. He exhibits no tenderness.  Abdominal: Soft. Bowel sounds are normal. He exhibits no distension and no mass. There is no tenderness. There is no rebound and no guarding.  Musculoskeletal: Normal range of motion. He exhibits no edema and no tenderness.  Lymphadenopathy:    He has no cervical adenopathy.  Neurological: He is alert and oriented to person, place, and time. He has normal reflexes. No cranial nerve deficit. He exhibits normal muscle tone. Coordination normal.  Skin: Skin is warm and dry. No rash noted.  Psychiatric: He has a normal mood and affect. His behavior is normal. Judgment and thought content normal.          Assessment & Plan:

## 2012-02-15 NOTE — Telephone Encounter (Signed)
Called patient//pt was seen in office

## 2012-02-16 ENCOUNTER — Ambulatory Visit: Payer: Medicare Other | Admitting: Internal Medicine

## 2012-02-17 ENCOUNTER — Encounter: Payer: Self-pay | Admitting: Internal Medicine

## 2012-02-28 ENCOUNTER — Other Ambulatory Visit: Payer: Self-pay | Admitting: Dermatology

## 2012-03-09 ENCOUNTER — Other Ambulatory Visit (HOSPITAL_COMMUNITY): Payer: Self-pay | Admitting: *Deleted

## 2012-03-14 ENCOUNTER — Encounter (HOSPITAL_COMMUNITY)
Admission: RE | Admit: 2012-03-14 | Discharge: 2012-03-14 | Disposition: A | Discharge status: Home / Self Care | Payer: Medicare Other | Source: Ambulatory Visit | Attending: Rheumatology | Admitting: Rheumatology

## 2012-03-14 ENCOUNTER — Other Ambulatory Visit: Payer: Self-pay | Admitting: Rheumatology

## 2012-03-14 DIAGNOSIS — M542 Cervicalgia: Secondary | ICD-10-CM

## 2012-03-14 DIAGNOSIS — M069 Rheumatoid arthritis, unspecified: Secondary | ICD-10-CM | POA: Insufficient documentation

## 2012-03-14 MED ORDER — SODIUM CHLORIDE 0.9 % IV SOLN
5.0000 mg/kg | INTRAVENOUS | Status: DC
  Administered 2012-03-14: 400 mg via INTRAVENOUS
  Filled 2012-03-14: qty 40

## 2012-03-14 MED ORDER — SODIUM CHLORIDE 0.9 % IV SOLN
Freq: Once | INTRAVENOUS | Status: AC
  Administered 2012-03-14: 09:00:00 via INTRAVENOUS

## 2012-03-15 ENCOUNTER — Encounter (HOSPITAL_COMMUNITY): Payer: Medicare Other

## 2012-03-21 ENCOUNTER — Ambulatory Visit
Admission: RE | Admit: 2012-03-21 | Discharge: 2012-03-21 | Disposition: A | Discharge status: Home / Self Care | Payer: Medicare Other | Source: Ambulatory Visit | Attending: Rheumatology | Admitting: Rheumatology

## 2012-03-21 DIAGNOSIS — M542 Cervicalgia: Secondary | ICD-10-CM

## 2012-04-03 ENCOUNTER — Encounter: Payer: Self-pay | Admitting: Internal Medicine

## 2012-04-03 ENCOUNTER — Ambulatory Visit (INDEPENDENT_AMBULATORY_CARE_PROVIDER_SITE_OTHER): Payer: Medicare Other | Admitting: Internal Medicine

## 2012-04-03 VITALS — BP 120/78 | HR 83 | Ht 71.5 in | Wt 183.8 lb

## 2012-04-03 DIAGNOSIS — E782 Mixed hyperlipidemia: Secondary | ICD-10-CM

## 2012-04-03 DIAGNOSIS — I10 Essential (primary) hypertension: Secondary | ICD-10-CM

## 2012-04-03 DIAGNOSIS — R002 Palpitations: Secondary | ICD-10-CM

## 2012-04-03 NOTE — Assessment & Plan Note (Signed)
Stable No change required today  

## 2012-04-03 NOTE — Progress Notes (Signed)
PCP: Sonda Primes, MD, MD  [name] is a 78 y.o. male who presents today for routine cardiology followup.  Since last being seen in our clinic, the patient reports doing very well.  He remains active and continues to exercise frequently.  His primary concern is with neck pain.   Today, he denies symptoms of palpitations, chest pain, shortness of breath,  lower extremity edema, dizziness, presyncope, or syncope.  The patient is otherwise without complaint today.   Past Medical History  Diagnosis Date  . Colon polyp   . Hyperlipidemia   . Osteoarthritis   . Migraine with aura   . RA (rheumatoid arthritis)   . Premature atrial contractions   . GERD (gastroesophageal reflux disease)   . Hypertension    Past Surgical History  Procedure Date  . Back surgery 2004    Current Outpatient Prescriptions  Medication Sig Dispense Refill  . amLODipine (NORVASC) 5 MG tablet Take 5 mg by mouth daily.        [name] aspirin 325 MG tablet Take 325 mg by mouth daily.        [name] atorvastatin (LIPITOR) 10 MG tablet Take 1 tablet (10 mg total) by mouth daily. Or as dirrected  30 tablet  3  . Cholecalciferol (VITAMIN D) 1000 UNITS capsule Take 1,000 Units by mouth daily.        . Cyanocobalamin 2500 MCG SUBL Place 1 tablet under the tongue daily.        . fexofenadine (ALLEGRA) 180 MG tablet Take 180 mg by mouth daily.        . fish oil-omega-3 fatty acids 1000 MG capsule Take 2 g by mouth daily.        [name] inFLIXimab (REMICADE) 100 MG injection q 2 mo  1 each  0  . leflunomide (ARAVA) 20 MG tablet Take 20 mg by mouth daily.        [name] LORazepam (ATIVAN) 0.5 MG tablet Take 1-2 tablets (0.5-1 mg total) by mouth 2 (two) times daily as needed for anxiety.  60 tablet  5  . omeprazole (PRILOSEC) 20 MG capsule Take 2 capsules (40 mg total) by mouth daily.  180 capsule  1  . predniSONE (DELTASONE) 5 MG tablet Take 5 mg by mouth. As directed can take up to 10 mg once daily       . Red Yeast Rice 600 MG CAPS Take 1  capsule by mouth daily.        . sildenafil (VIAGRA) 100 MG tablet Take 100 mg by mouth daily as needed.        . traMADol (ULTRAM) 50 MG tablet Take 100 mg by mouth 2 (two) times daily as needed.      . zolpidem (AMBIEN) 10 MG tablet Take 5 mg by mouth at bedtime as needed.      [name] DISCONTD: omeprazole (PRILOSEC) 20 MG capsule Take 1 capsule (20 mg total) by mouth daily.  90 capsule  3  . DISCONTD: traMADol (ULTRAM) 50 MG tablet Take 1-2 tablets (50-100 mg total) by mouth 2 (two) times daily as needed for pain. Maximum dose= 8 tablets per day  120 tablet  3  . DISCONTD: zolpidem (AMBIEN) 10 MG tablet Take 1 tablet (10 mg total) by mouth at bedtime as needed for sleep.  90 tablet  0    Physical Exam: Filed Vitals:   04/03/12 0957  BP: 120/78  Pulse: 83  Height: 5' 11.5" (1.816 m)  Weight: 183 lb  12.8 oz (83.371 kg)    GEN- The patient is well appearing, alert and oriented x 3 today.   Head- normocephalic, atraumatic Eyes-  Sclera clear, conjunctiva pink Ears- hearing intact Oropharynx- clear Lungs- Clear to ausculation bilaterally, normal work of breathing Heart- Regular rate and rhythm, no murmurs, rubs or gallops, PMI not laterally displaced GI- soft, NT, ND, + BS Extremities- no clubbing, cyanosis, or edema  ekg today reveals sinus rhythm, incomplete RBBB  Nuclear stress from 2012 reviewed  Assessment and Plan:

## 2012-04-03 NOTE — Assessment & Plan Note (Signed)
resolved 

## 2012-04-03 NOTE — Assessment & Plan Note (Signed)
Previously reports myalgias with statins Doing well presently on low dose lipitor

## 2012-04-03 NOTE — Patient Instructions (Signed)
Your physician wants you to follow-up in: 12 months with Dr Jacquiline Doe will receive a reminder letter in the mail two months in advance. If you don't receive a letter, please call our office to schedule the follow-up appointment.   Dr Johney Frame has agreed to see Mr. Hoar wife

## 2012-04-13 ENCOUNTER — Other Ambulatory Visit (INDEPENDENT_AMBULATORY_CARE_PROVIDER_SITE_OTHER): Payer: Medicare Other

## 2012-04-13 DIAGNOSIS — Z79899 Other long term (current) drug therapy: Secondary | ICD-10-CM

## 2012-04-13 DIAGNOSIS — M2550 Pain in unspecified joint: Secondary | ICD-10-CM

## 2012-04-13 DIAGNOSIS — M069 Rheumatoid arthritis, unspecified: Secondary | ICD-10-CM

## 2012-04-13 DIAGNOSIS — R109 Unspecified abdominal pain: Secondary | ICD-10-CM

## 2012-04-13 DIAGNOSIS — R202 Paresthesia of skin: Secondary | ICD-10-CM

## 2012-04-13 DIAGNOSIS — R7309 Other abnormal glucose: Secondary | ICD-10-CM

## 2012-04-13 LAB — HEPATIC FUNCTION PANEL
ALT: 22 U/L (ref 0–53)
AST: 22 U/L (ref 0–37)
Albumin: 4.1 g/dL (ref 3.5–5.2)
Alkaline Phosphatase: 48 U/L (ref 39–117)
Bilirubin, Direct: 0.1 mg/dL (ref 0.0–0.3)
Total Bilirubin: 0.8 mg/dL (ref 0.3–1.2)
Total Protein: 7.4 g/dL (ref 6.0–8.3)

## 2012-04-13 LAB — BASIC METABOLIC PANEL
BUN: 15 mg/dL (ref 6–23)
CO2: 30 mEq/L (ref 19–32)
Calcium: 9.1 mg/dL (ref 8.4–10.5)
Chloride: 106 mEq/L (ref 96–112)
Creatinine, Ser: 0.8 mg/dL (ref 0.4–1.5)
GFR: 107.43 mL/min (ref >60.00)
Glucose, Bld: 84 mg/dL (ref 70–99)
Potassium: 4.1 mEq/L (ref 3.5–5.1)
Sodium: 142 mEq/L (ref 135–145)

## 2012-04-13 LAB — LDL CHOLESTEROL, DIRECT: Direct LDL: 119.2 mg/dL

## 2012-04-13 LAB — LIPID PANEL
Cholesterol: 209 mg/dL — ABNORMAL HIGH (ref 0–200)
HDL: 68.3 mg/dL (ref >39.00)
Total CHOL/HDL Ratio: 3
Triglycerides: 111 mg/dL (ref 0.0–149.0)
VLDL: 22.2 mg/dL (ref 0.0–40.0)

## 2012-04-13 LAB — CK: Total CK: 142 U/L (ref 7–232)

## 2012-04-13 LAB — VITAMIN B12: Vitamin B-12: 1048 pg/mL — ABNORMAL HIGH (ref 211–911)

## 2012-04-14 ENCOUNTER — Other Ambulatory Visit: Payer: Self-pay | Admitting: Cardiology

## 2012-04-14 LAB — VITAMIN D 25 HYDROXY (VIT D DEFICIENCY, FRACTURES): Vit D, 25-Hydroxy: 48 ng/mL (ref 30–89)

## 2012-04-14 MED ORDER — AMLODIPINE BESYLATE 5 MG PO TABS: 5.0000 mg | ORAL_TABLET | Freq: Every day | ORAL | Status: DC

## 2012-04-19 ENCOUNTER — Ambulatory Visit (INDEPENDENT_AMBULATORY_CARE_PROVIDER_SITE_OTHER): Payer: Medicare Other | Admitting: Internal Medicine

## 2012-04-19 ENCOUNTER — Encounter: Payer: Self-pay | Admitting: Internal Medicine

## 2012-04-19 VITALS — BP 130/82 | HR 88 | Temp 98.1°F | Resp 16 | Wt 180.0 lb

## 2012-04-19 DIAGNOSIS — R002 Palpitations: Secondary | ICD-10-CM

## 2012-04-19 DIAGNOSIS — M2550 Pain in unspecified joint: Secondary | ICD-10-CM

## 2012-04-19 DIAGNOSIS — M069 Rheumatoid arthritis, unspecified: Secondary | ICD-10-CM

## 2012-04-19 DIAGNOSIS — E782 Mixed hyperlipidemia: Secondary | ICD-10-CM

## 2012-04-19 DIAGNOSIS — IMO0001 Reserved for inherently not codable concepts without codable children: Secondary | ICD-10-CM

## 2012-04-19 DIAGNOSIS — M542 Cervicalgia: Secondary | ICD-10-CM

## 2012-04-19 MED ORDER — PREGABALIN 75 MG PO CAPS: 75.0000 mg | ORAL_CAPSULE | Freq: Two times a day (BID) | ORAL | Status: DC

## 2012-04-19 NOTE — Progress Notes (Signed)
Patient ID: [name], male   DOB: Sep 29, 1933, 78 y.o.   MRN: [id number]  Subjective:    Patient ID: [name], male    DOB: 1933/06/30, 78 y.o.   MRN: [id number]  HPI  The patient presents for a follow-up of a new hypertension detected, chronic dyslipidemia, RA controlled with medicines. He was recently checked by Dr Channing Mutters for sever neck OA, using neck traction now.   Wt Readings from Last 3 Encounters:  04/19/12 180 lb (81.647 kg)  04/03/12 183 lb 12.8 oz (83.371 kg)  03/14/12 180 lb (81.647 kg)   BP Readings from Last 3 Encounters:  04/19/12 130/82  04/03/12 120/78  03/14/12 142/82     Review of Systems  Constitutional: Negative for appetite change, fatigue and unexpected weight change.  HENT: Negative for nosebleeds, congestion, sore throat, sneezing, trouble swallowing and neck pain.   Eyes: Negative for itching and visual disturbance.  Respiratory: Negative for cough and shortness of breath.   Cardiovascular: Negative for chest pain, palpitations and leg swelling.  Gastrointestinal: Negative for nausea, diarrhea, blood in stool and abdominal distention.  Genitourinary: Negative for frequency and hematuria.  Musculoskeletal: Positive for arthralgias. Negative for back pain, joint swelling and gait problem.  Skin: Negative for rash.  Neurological: Negative for dizziness, tremors, speech difficulty and weakness.  Psychiatric/Behavioral: Negative for sleep disturbance, dysphoric mood and agitation. The patient is not nervous/anxious.        Objective:   Physical Exam  Constitutional: He is oriented to person, place, and time. He appears well-developed.  HENT:  Mouth/Throat: Oropharynx is clear and moist.  Eyes: Conjunctivae are normal. Pupils are equal, round, and reactive to light.  Neck: Normal range of motion. No JVD present. No thyromegaly present.  Cardiovascular: Normal rate, regular rhythm, normal heart sounds and intact distal pulses.  Exam reveals no gallop and  no friction rub.   No murmur heard. Pulmonary/Chest: Effort normal and breath sounds normal. No respiratory distress. He has no wheezes. He has no rales. He exhibits no tenderness.  Abdominal: Soft. Bowel sounds are normal. He exhibits no distension and no mass. There is no tenderness. There is no rebound and no guarding.  Musculoskeletal: Normal range of motion. He exhibits tenderness. He exhibits no edema.  Lymphadenopathy:    He has no cervical adenopathy.  Neurological: He is alert and oriented to person, place, and time. He has normal reflexes. No cranial nerve deficit. He exhibits normal muscle tone. Coordination normal.  Skin: Skin is warm and dry. No rash noted.  Psychiatric: He has a normal mood and affect. His behavior is normal. Judgment and thought content normal.          Assessment & Plan:

## 2012-04-20 ENCOUNTER — Telehealth: Payer: Self-pay

## 2012-04-20 NOTE — Telephone Encounter (Signed)
Message copied by Anselm Jungling on Thu Apr 20, 2012 11:22 AM ------      Message from: Janeal Holmes      Created: Thu Apr 20, 2012  7:35 AM       [employer], [place] or [email or web address], I guess      Thx      ----- Message -----         From: Anselm Jungling, CMA         Sent: 04/19/2012   4:02 PM           To: Tresa Garter, MD            Pt called stating you advised him to start using a memory foam pillow at OV today. He cannot remember where you told him to go to find the pillow. I can call him for you, I just need the locations you gave him.       Thanks!

## 2012-04-20 NOTE — Telephone Encounter (Signed)
Pt advised.

## 2012-04-23 DIAGNOSIS — M542 Cervicalgia: Secondary | ICD-10-CM | POA: Insufficient documentation

## 2012-04-23 NOTE — Assessment & Plan Note (Signed)
Better on Rx 

## 2012-04-23 NOTE — Assessment & Plan Note (Signed)
Doing well now 

## 2012-04-23 NOTE — Assessment & Plan Note (Signed)
Chronic - worse in 5/13 Dr Channing Mutters Traction tid See meds MRI reviewed

## 2012-04-23 NOTE — Assessment & Plan Note (Signed)
Continue with current prescription therapy as reflected on the Med list.  

## 2012-04-23 NOTE — Assessment & Plan Note (Signed)
Doing better.   

## 2012-05-05 ENCOUNTER — Other Ambulatory Visit: Payer: Self-pay | Admitting: Internal Medicine

## 2012-05-05 NOTE — Telephone Encounter (Signed)
Ok to Rf in PCP absence? Thanks!! 

## 2012-05-15 ENCOUNTER — Encounter (HOSPITAL_COMMUNITY)
Admission: RE | Admit: 2012-05-15 | Discharge: 2012-05-15 | Disposition: A | Discharge status: Home / Self Care | Payer: Medicare Other | Source: Ambulatory Visit | Attending: Rheumatology | Admitting: Rheumatology

## 2012-05-15 DIAGNOSIS — M069 Rheumatoid arthritis, unspecified: Secondary | ICD-10-CM | POA: Insufficient documentation

## 2012-05-15 MED ORDER — TRAMADOL HCL 50 MG PO TABS
50.0000 mg | ORAL_TABLET | Freq: Once | ORAL | Status: AC
  Administered 2012-05-15: 50 mg via ORAL
  Filled 2012-05-15: qty 1

## 2012-05-15 MED ORDER — SODIUM CHLORIDE 0.9 % IV SOLN
5.0000 mg/kg | INTRAVENOUS | Status: DC
  Administered 2012-05-15: 400 mg via INTRAVENOUS
  Filled 2012-05-15: qty 40

## 2012-06-13 ENCOUNTER — Other Ambulatory Visit: Payer: Self-pay | Admitting: Internal Medicine

## 2012-06-14 NOTE — Telephone Encounter (Signed)
Phoned in prescription refill to [name] for Zolpidem 10 mg #90 with 1 refill.

## 2012-06-19 ENCOUNTER — Encounter: Payer: Self-pay | Admitting: Internal Medicine

## 2012-06-19 ENCOUNTER — Ambulatory Visit (INDEPENDENT_AMBULATORY_CARE_PROVIDER_SITE_OTHER): Payer: Medicare Other | Admitting: Internal Medicine

## 2012-06-19 VITALS — BP 130/82 | HR 80 | Temp 97.9°F | Resp 16 | Wt 181.0 lb

## 2012-06-19 DIAGNOSIS — M545 Low back pain, unspecified: Secondary | ICD-10-CM

## 2012-06-19 DIAGNOSIS — R5381 Other malaise: Secondary | ICD-10-CM

## 2012-06-19 DIAGNOSIS — E785 Hyperlipidemia, unspecified: Secondary | ICD-10-CM

## 2012-06-19 DIAGNOSIS — R5383 Other fatigue: Secondary | ICD-10-CM

## 2012-06-19 DIAGNOSIS — M069 Rheumatoid arthritis, unspecified: Secondary | ICD-10-CM

## 2012-06-19 DIAGNOSIS — I10 Essential (primary) hypertension: Secondary | ICD-10-CM

## 2012-06-19 DIAGNOSIS — M542 Cervicalgia: Secondary | ICD-10-CM

## 2012-06-19 NOTE — Assessment & Plan Note (Signed)
Continue with current prescription therapy as reflected on the Med list.  

## 2012-06-19 NOTE — Progress Notes (Signed)
   Subjective:    Patient ID: [name], male    DOB: [birth year], 78 y.o.   MRN: [id number]  HPI  The patient presents for a follow-up of a new hypertension detected, chronic dyslipidemia, RA controlled with medicines. He was recently checked by Dr Channing Mutters for sever neck OA, using neck traction now - better. He started yoga. C/o fatigue   Wt Readings from Last 3 Encounters:  06/19/12 181 lb (82.101 kg)  05/15/12 180 lb (81.647 kg)  04/19/12 180 lb (81.647 kg)   BP Readings from Last 3 Encounters:  06/19/12 130/82  05/15/12 164/87  04/19/12 130/82     Review of Systems  Constitutional: Negative for appetite change, fatigue and unexpected weight change.  HENT: Negative for nosebleeds, congestion, sore throat, sneezing, trouble swallowing and neck pain.   Eyes: Negative for itching and visual disturbance.  Respiratory: Negative for cough and shortness of breath.   Cardiovascular: Negative for chest pain, palpitations and leg swelling.  Gastrointestinal: Negative for nausea, diarrhea, blood in stool and abdominal distention.  Genitourinary: Negative for frequency and hematuria.  Musculoskeletal: Positive for arthralgias. Negative for back pain, joint swelling and gait problem.  Skin: Negative for rash.  Neurological: Negative for dizziness, tremors, speech difficulty and weakness.  Psychiatric/Behavioral: Negative for disturbed wake/sleep cycle, dysphoric mood and agitation. The patient is not nervous/anxious.        Objective:   Physical Exam  Constitutional: He is oriented to person, place, and time. He appears well-developed.  HENT:  Mouth/Throat: Oropharynx is clear and moist.  Eyes: Conjunctivae are normal. Pupils are equal, round, and reactive to light.  Neck: Normal range of motion. No JVD present. No thyromegaly present.  Cardiovascular: Normal rate, regular rhythm, normal heart sounds and intact distal pulses.  Exam reveals no gallop and no friction rub.   No murmur  heard. Pulmonary/Chest: Effort normal and breath sounds normal. No respiratory distress. He has no wheezes. He has no rales. He exhibits no tenderness.  Abdominal: Soft. Bowel sounds are normal. He exhibits no distension and no mass. There is no tenderness. There is no rebound and no guarding.  Musculoskeletal: Normal range of motion. He exhibits tenderness. He exhibits no edema.  Lymphadenopathy:    He has no cervical adenopathy.  Neurological: He is alert and oriented to person, place, and time. He has normal reflexes. No cranial nerve deficit. He exhibits normal muscle tone. Coordination normal.  Skin: Skin is warm and dry. No rash noted.  Psychiatric: He has a normal mood and affect. His behavior is normal. Judgment and thought content normal.   Lab Results  Component Value Date   WBC 5.1 02/08/2012   HGB 14.4 02/08/2012   HCT 42.6 02/08/2012   PLT 287.0 02/08/2012   GLUCOSE 84 04/13/2012   CHOL 209* 04/13/2012   TRIG 111.0 04/13/2012   HDL 68.30 04/13/2012   LDLDIRECT 119.2 04/13/2012   LDLCALC 112* 05/13/2009   ALT 22 04/13/2012   AST 22 04/13/2012   NA 142 04/13/2012   K 4.1 04/13/2012   CL 106 04/13/2012   CREATININE 0.8 04/13/2012   BUN 15 04/13/2012   CO2 30 04/13/2012   TSH 0.87 06/04/2011   PSA 1.52 06/04/2011   HGBA1C 6.3 12/08/2010          Assessment & Plan:

## 2012-06-21 ENCOUNTER — Other Ambulatory Visit (INDEPENDENT_AMBULATORY_CARE_PROVIDER_SITE_OTHER): Payer: Medicare Other

## 2012-06-21 DIAGNOSIS — M545 Low back pain, unspecified: Secondary | ICD-10-CM

## 2012-06-21 DIAGNOSIS — E785 Hyperlipidemia, unspecified: Secondary | ICD-10-CM

## 2012-06-21 DIAGNOSIS — R5383 Other fatigue: Secondary | ICD-10-CM

## 2012-06-21 DIAGNOSIS — I10 Essential (primary) hypertension: Secondary | ICD-10-CM

## 2012-06-21 DIAGNOSIS — M069 Rheumatoid arthritis, unspecified: Secondary | ICD-10-CM

## 2012-06-21 DIAGNOSIS — Z79899 Other long term (current) drug therapy: Secondary | ICD-10-CM

## 2012-06-21 DIAGNOSIS — R5381 Other malaise: Secondary | ICD-10-CM

## 2012-06-21 LAB — BASIC METABOLIC PANEL
BUN: 15 mg/dL (ref 6–23)
CO2: 29 mEq/L (ref 19–32)
Calcium: 9.1 mg/dL (ref 8.4–10.5)
Chloride: 106 mEq/L (ref 96–112)
Creatinine, Ser: 0.8 mg/dL (ref 0.4–1.5)
GFR: 99.67 mL/min (ref >60.00)
Glucose, Bld: 94 mg/dL (ref 70–99)
Potassium: 4.1 mEq/L (ref 3.5–5.1)
Sodium: 141 mEq/L (ref 135–145)

## 2012-06-21 LAB — CBC WITH DIFFERENTIAL/PLATELET
Basophils Absolute: 0 10*3/uL (ref 0.0–0.1)
Basophils Relative: 1 % (ref 0.0–3.0)
Eosinophils Absolute: 0.1 10*3/uL (ref 0.0–0.7)
Eosinophils Relative: 3.1 % (ref 0.0–5.0)
HCT: 39.4 % (ref 39.0–52.0)
Hemoglobin: 13.2 g/dL (ref 13.0–17.0)
Lymphocytes Relative: 47.1 % — ABNORMAL HIGH (ref 12.0–46.0)
Lymphs Abs: 2 10*3/uL (ref 0.7–4.0)
MCHC: 33.5 g/dL (ref 30.0–36.0)
MCV: 96.6 fl (ref 78.0–100.0)
Monocytes Absolute: 0.5 10*3/uL (ref 0.1–1.0)
Monocytes Relative: 11.6 % (ref 3.0–12.0)
Neutro Abs: 1.6 10*3/uL (ref 1.4–7.7)
Neutrophils Relative %: 37.2 % — ABNORMAL LOW (ref 43.0–77.0)
Platelets: 259 10*3/uL (ref 150.0–400.0)
RBC: 4.08 Mil/uL — ABNORMAL LOW (ref 4.22–5.81)
RDW: 13.4 % (ref 11.5–14.6)
WBC: 4.3 10*3/uL — ABNORMAL LOW (ref 4.5–10.5)

## 2012-06-21 LAB — LIPID PANEL
Cholesterol: 181 mg/dL (ref 0–200)
HDL: 56.6 mg/dL (ref >39.00)
LDL Cholesterol: 106 mg/dL — ABNORMAL HIGH (ref 0–99)
Total CHOL/HDL Ratio: 3
Triglycerides: 91 mg/dL (ref 0.0–149.0)
VLDL: 18.2 mg/dL (ref 0.0–40.0)

## 2012-06-21 LAB — HEPATIC FUNCTION PANEL
ALT: 23 U/L (ref 0–53)
AST: 26 U/L (ref 0–37)
Albumin: 3.9 g/dL (ref 3.5–5.2)
Alkaline Phosphatase: 46 U/L (ref 39–117)
Bilirubin, Direct: 0.1 mg/dL (ref 0.0–0.3)
Total Bilirubin: 1 mg/dL (ref 0.3–1.2)
Total Protein: 7.1 g/dL (ref 6.0–8.3)

## 2012-06-21 LAB — TSH: TSH: 1.31 u[IU]/mL (ref 0.35–5.50)

## 2012-06-21 LAB — TESTOSTERONE: Testosterone: 327.57 ng/dL — ABNORMAL LOW (ref 350.00–890.00)

## 2012-06-21 LAB — VITAMIN B12: Vitamin B-12: 625 pg/mL (ref 211–911)

## 2012-06-23 ENCOUNTER — Other Ambulatory Visit: Payer: Self-pay | Admitting: Internal Medicine

## 2012-06-23 NOTE — Telephone Encounter (Signed)
Lipitor is done. Please advise on Tramadol.

## 2012-06-23 NOTE — Telephone Encounter (Signed)
Pt left message on triage this morning requesting refills for Tramadol and Lipitor.

## 2012-06-26 MED ORDER — TRAMADOL HCL 50 MG PO TABS: 100.0000 mg | ORAL_TABLET | Freq: Two times a day (BID) | ORAL | Status: DC | PRN

## 2012-06-26 NOTE — Telephone Encounter (Signed)
The pt called the triage line and is hoping to get a refill on his Tramadol.   Thanks!

## 2012-06-26 NOTE — Telephone Encounter (Signed)
Called tramadol into pharmacy spoke with Fibee/rep gave approval status... 06/26/12@11 :41am/LMB

## 2012-07-10 ENCOUNTER — Telehealth: Payer: Self-pay | Admitting: *Deleted

## 2012-07-10 ENCOUNTER — Encounter (HOSPITAL_COMMUNITY)
Admission: RE | Admit: 2012-07-10 | Discharge: 2012-07-10 | Disposition: A | Discharge status: Home / Self Care | Payer: Medicare Other | Source: Ambulatory Visit | Attending: Rheumatology | Admitting: Rheumatology

## 2012-07-10 ENCOUNTER — Other Ambulatory Visit (HOSPITAL_COMMUNITY): Payer: Self-pay | Admitting: *Deleted

## 2012-07-10 DIAGNOSIS — M069 Rheumatoid arthritis, unspecified: Secondary | ICD-10-CM | POA: Insufficient documentation

## 2012-07-10 MED ORDER — SODIUM CHLORIDE 0.9 % IV SOLN
INTRAVENOUS | Status: DC
  Administered 2012-07-10: 09:00:00 via INTRAVENOUS

## 2012-07-10 MED ORDER — INFLIXIMAB 100 MG IV SOLR
5.0000 mg/kg | INTRAVENOUS | Status: DC
  Administered 2012-07-10: 400 mg via INTRAVENOUS
  Filled 2012-07-10: qty 40

## 2012-07-10 NOTE — Telephone Encounter (Signed)
OK to fill this prescription with additional refills x3 Thank you!  

## 2012-07-10 NOTE — Telephone Encounter (Signed)
Rf req for Lorazepam 0.5 mg 1-2 po bid prn. # 60. Last filled 6.24.13. Ok to Rf?

## 2012-07-11 MED ORDER — LORAZEPAM 0.5 MG PO TABS: 0.5000 mg | ORAL_TABLET | Freq: Two times a day (BID) | ORAL | Status: DC | PRN

## 2012-07-11 NOTE — Telephone Encounter (Signed)
Rx faxed to pharmacy  

## 2012-07-11 NOTE — Telephone Encounter (Signed)
Rx printed and awaiting MD signature 

## 2012-07-27 ENCOUNTER — Other Ambulatory Visit: Payer: Self-pay | Admitting: Internal Medicine

## 2012-08-04 ENCOUNTER — Telehealth: Payer: Self-pay | Admitting: Internal Medicine

## 2012-08-04 NOTE — Telephone Encounter (Signed)
Lmom for patient that I had not called him today

## 2012-08-04 NOTE — Telephone Encounter (Signed)
New Problem:    Patient called in claiming that he received a call from our office regarding his labs from 06/21/12.  Please call back.

## 2012-08-15 ENCOUNTER — Ambulatory Visit (INDEPENDENT_AMBULATORY_CARE_PROVIDER_SITE_OTHER): Payer: Medicare Other | Admitting: *Deleted

## 2012-08-15 DIAGNOSIS — Z23 Encounter for immunization: Secondary | ICD-10-CM

## 2012-08-16 ENCOUNTER — Encounter: Payer: Self-pay | Admitting: Internal Medicine

## 2012-08-21 ENCOUNTER — Encounter (HOSPITAL_COMMUNITY)
Admission: RE | Admit: 2012-08-21 | Discharge: 2012-08-21 | Disposition: A | Discharge status: Home / Self Care | Payer: Medicare Other | Source: Ambulatory Visit | Attending: Rheumatology | Admitting: Rheumatology

## 2012-08-21 ENCOUNTER — Other Ambulatory Visit (HOSPITAL_COMMUNITY): Payer: Self-pay | Admitting: *Deleted

## 2012-08-21 DIAGNOSIS — M069 Rheumatoid arthritis, unspecified: Secondary | ICD-10-CM | POA: Insufficient documentation

## 2012-08-21 MED ORDER — SODIUM CHLORIDE 0.9 % IV SOLN: 5.0000 mg/kg | INTRAVENOUS | Status: DC

## 2012-08-21 MED ORDER — SODIUM CHLORIDE 0.9 % IV SOLN: INTRAVENOUS | Status: DC

## 2012-08-21 MED ORDER — SODIUM CHLORIDE 0.9 % IV SOLN
750.0000 mg | INTRAVENOUS | Status: DC
  Administered 2012-08-21: 750 mg via INTRAVENOUS
  Filled 2012-08-21: qty 30

## 2012-08-21 MED ORDER — SODIUM CHLORIDE 0.9 % IV SOLN
INTRAVENOUS | Status: DC
  Administered 2012-08-21: 250 mL via INTRAVENOUS

## 2012-09-04 ENCOUNTER — Encounter (HOSPITAL_COMMUNITY)
Admission: RE | Admit: 2012-09-04 | Discharge: 2012-09-04 | Disposition: A | Discharge status: Home / Self Care | Payer: Medicare Other | Source: Ambulatory Visit | Attending: Rheumatology | Admitting: Rheumatology

## 2012-09-04 MED ORDER — SODIUM CHLORIDE 0.9 % IV SOLN
750.0000 mg | INTRAVENOUS | Status: DC
  Administered 2012-09-04: 750 mg via INTRAVENOUS
  Filled 2012-09-04: qty 30

## 2012-09-04 MED ORDER — SODIUM CHLORIDE 0.9 % IV SOLN
INTRAVENOUS | Status: DC
  Administered 2012-09-04: 10:00:00 via INTRAVENOUS

## 2012-09-06 ENCOUNTER — Other Ambulatory Visit: Payer: Self-pay | Admitting: *Deleted

## 2012-09-06 MED ORDER — AMLODIPINE BESYLATE 5 MG PO TABS: 5.0000 mg | ORAL_TABLET | Freq: Every day | ORAL | Status: DC

## 2012-09-06 MED ORDER — ATORVASTATIN CALCIUM 10 MG PO TABS: 10.0000 mg | ORAL_TABLET | Freq: Every day | ORAL | Status: DC

## 2012-09-18 ENCOUNTER — Encounter (HOSPITAL_COMMUNITY)
Admission: RE | Admit: 2012-09-18 | Discharge: 2012-09-18 | Disposition: A | Discharge status: Home / Self Care | Payer: Medicare Other | Source: Ambulatory Visit | Attending: Rheumatology | Admitting: Rheumatology

## 2012-09-18 DIAGNOSIS — M069 Rheumatoid arthritis, unspecified: Secondary | ICD-10-CM | POA: Insufficient documentation

## 2012-09-18 MED ORDER — SODIUM CHLORIDE 0.9 % IV SOLN
INTRAVENOUS | Status: AC
Start: 2012-09-18 — End: 2012-09-18
  Administered 2012-09-18: 09:00:00 via INTRAVENOUS

## 2012-09-18 MED ORDER — ABATACEPT 250 MG IV SOLR
750.0000 mg | INTRAVENOUS | Status: AC
  Administered 2012-09-18: 750 mg via INTRAVENOUS
  Filled 2012-09-18: qty 30

## 2012-09-20 ENCOUNTER — Ambulatory Visit (INDEPENDENT_AMBULATORY_CARE_PROVIDER_SITE_OTHER): Payer: Medicare Other | Admitting: Internal Medicine

## 2012-09-20 ENCOUNTER — Encounter: Payer: Self-pay | Admitting: Internal Medicine

## 2012-09-20 VITALS — BP 148/80 | HR 80 | Temp 97.2°F | Resp 16 | Wt 178.0 lb

## 2012-09-20 DIAGNOSIS — M069 Rheumatoid arthritis, unspecified: Secondary | ICD-10-CM

## 2012-09-20 DIAGNOSIS — R5383 Other fatigue: Secondary | ICD-10-CM

## 2012-09-20 DIAGNOSIS — E782 Mixed hyperlipidemia: Secondary | ICD-10-CM

## 2012-09-20 DIAGNOSIS — M2550 Pain in unspecified joint: Secondary | ICD-10-CM

## 2012-09-20 DIAGNOSIS — R5381 Other malaise: Secondary | ICD-10-CM

## 2012-09-20 DIAGNOSIS — R002 Palpitations: Secondary | ICD-10-CM

## 2012-09-20 MED ORDER — HYDROCODONE-ACETAMINOPHEN 5-325 MG PO TABS: 1.0000 | ORAL_TABLET | Freq: Four times a day (QID) | ORAL | Status: DC | PRN

## 2012-09-20 MED ORDER — OXYCODONE-ACETAMINOPHEN 5-325 MG PO TABS: 1.0000 | ORAL_TABLET | Freq: Four times a day (QID) | ORAL | Status: DC | PRN

## 2012-09-20 NOTE — Assessment & Plan Note (Signed)
Continue with current prescription therapy as reflected on the Med list.  

## 2012-09-20 NOTE — Assessment & Plan Note (Signed)
Percocet prn D/c hydrocodone

## 2012-09-20 NOTE — Assessment & Plan Note (Signed)
Better  

## 2012-09-20 NOTE — Progress Notes (Signed)
   Subjective:    Patient ID: [name], male    DOB: [birth year], 78 y.o.   MRN: [id number]  HPI  The patient presents for a follow-up of a new hypertension detected, chronic dyslipidemia, RA controlled with medicines. He was recently checked again by Dr Channing Mutters for severe neck OA, using neck traction now - better. Hydrocodone is not working too well. Oxycodone did work well. He started yoga.  C/o fatigue He is on [place] now (remicade stopped working).   Wt Readings from Last 3 Encounters:  09/20/12 178 lb (80.74 kg)  09/18/12 175 lb (79.379 kg)  09/04/12 175 lb (79.379 kg)   BP Readings from Last 3 Encounters:  09/20/12 148/80  09/18/12 151/84  09/04/12 149/89     Review of Systems  Constitutional: Negative for appetite change, fatigue and unexpected weight change.  HENT: Negative for nosebleeds, congestion, sore throat, sneezing, trouble swallowing and neck pain.   Eyes: Negative for itching and visual disturbance.  Respiratory: Negative for cough and shortness of breath.   Cardiovascular: Negative for chest pain, palpitations and leg swelling.  Gastrointestinal: Negative for nausea, diarrhea, blood in stool and abdominal distention.  Genitourinary: Negative for frequency and hematuria.  Musculoskeletal: Positive for arthralgias. Negative for back pain, joint swelling and gait problem.  Skin: Negative for rash.  Neurological: Negative for dizziness, tremors, speech difficulty and weakness.  Psychiatric/Behavioral: Negative for sleep disturbance, dysphoric mood and agitation. The patient is not nervous/anxious.        Objective:   Physical Exam  Constitutional: He is oriented to person, place, and time. He appears well-developed.  HENT:  Mouth/Throat: Oropharynx is clear and moist.  Eyes: Conjunctivae normal are normal. Pupils are equal, round, and reactive to light.  Neck: Normal range of motion. No JVD present. No thyromegaly present.  Cardiovascular: Normal rate,  regular rhythm, normal heart sounds and intact distal pulses.  Exam reveals no gallop and no friction rub.   No murmur heard. Pulmonary/Chest: Effort normal and breath sounds normal. No respiratory distress. He has no wheezes. He has no rales. He exhibits no tenderness.  Abdominal: Soft. Bowel sounds are normal. He exhibits no distension and no mass. There is no tenderness. There is no rebound and no guarding.  Musculoskeletal: Normal range of motion. He exhibits tenderness. He exhibits no edema.  Lymphadenopathy:    He has no cervical adenopathy.  Neurological: He is alert and oriented to person, place, and time. He has normal reflexes. No cranial nerve deficit. He exhibits normal muscle tone. Coordination normal.  Skin: Skin is warm and dry. No rash noted.  Psychiatric: He has a normal mood and affect. His behavior is normal. Judgment and thought content normal.   Lab Results  Component Value Date   WBC 4.3* 06/21/2012   HGB 13.2 06/21/2012   HCT 39.4 06/21/2012   PLT 259.0 06/21/2012   GLUCOSE 94 06/21/2012   CHOL 181 06/21/2012   TRIG 91.0 06/21/2012   HDL 56.60 06/21/2012   LDLDIRECT 119.2 04/13/2012   LDLCALC 106* 06/21/2012   ALT 23 06/21/2012   AST 26 06/21/2012   NA 141 06/21/2012   K 4.1 06/21/2012   CL 106 06/21/2012   CREATININE 0.8 06/21/2012   BUN 15 06/21/2012   CO2 29 06/21/2012   TSH 1.31 06/21/2012   PSA 1.52 06/04/2011   HGBA1C 6.3 12/08/2010          Assessment & Plan:

## 2012-09-20 NOTE — Assessment & Plan Note (Signed)
A little better 

## 2012-09-29 ENCOUNTER — Other Ambulatory Visit: Payer: Self-pay | Admitting: Internal Medicine

## 2012-09-29 NOTE — Telephone Encounter (Signed)
Ok to RF? 

## 2012-10-16 ENCOUNTER — Encounter (HOSPITAL_COMMUNITY)
Admission: RE | Admit: 2012-10-16 | Discharge: 2012-10-16 | Disposition: A | Discharge status: Home / Self Care | Payer: Medicare Other | Source: Ambulatory Visit | Attending: Rheumatology | Admitting: Rheumatology

## 2012-10-16 DIAGNOSIS — M069 Rheumatoid arthritis, unspecified: Secondary | ICD-10-CM | POA: Insufficient documentation

## 2012-10-16 MED ORDER — SODIUM CHLORIDE 0.9 % IV SOLN
Freq: Once | INTRAVENOUS | Status: AC
  Administered 2012-10-16: 09:00:00 via INTRAVENOUS

## 2012-10-16 MED ORDER — SODIUM CHLORIDE 0.9 % IV SOLN
750.0000 mg | INTRAVENOUS | Status: AC
  Administered 2012-10-16: 750 mg via INTRAVENOUS
  Filled 2012-10-16: qty 30

## 2012-11-02 ENCOUNTER — Telehealth: Payer: Self-pay | Admitting: *Deleted

## 2012-11-02 NOTE — Telephone Encounter (Signed)
Rec fax stating effective 11/15/12 pt needs 90 day supplies on all meds.

## 2012-11-10 ENCOUNTER — Other Ambulatory Visit (HOSPITAL_COMMUNITY): Payer: Self-pay | Admitting: *Deleted

## 2012-11-13 ENCOUNTER — Encounter (HOSPITAL_COMMUNITY)
Admission: RE | Admit: 2012-11-13 | Discharge: 2012-11-13 | Disposition: A | Discharge status: Home / Self Care | Payer: Medicare Other | Source: Ambulatory Visit | Attending: Rheumatology | Admitting: Rheumatology

## 2012-11-13 MED ORDER — SODIUM CHLORIDE 0.9 % IV SOLN
750.0000 mg | INTRAVENOUS | Status: DC
  Administered 2012-11-13: 750 mg via INTRAVENOUS
  Filled 2012-11-13: qty 30

## 2012-11-13 MED ORDER — SODIUM CHLORIDE 0.9 % IV SOLN
INTRAVENOUS | Status: DC
  Administered 2012-11-13: 10:00:00 via INTRAVENOUS

## 2012-11-30 ENCOUNTER — Other Ambulatory Visit (INDEPENDENT_AMBULATORY_CARE_PROVIDER_SITE_OTHER): Payer: Medicare Other

## 2012-11-30 DIAGNOSIS — R002 Palpitations: Secondary | ICD-10-CM

## 2012-11-30 DIAGNOSIS — M069 Rheumatoid arthritis, unspecified: Secondary | ICD-10-CM

## 2012-11-30 DIAGNOSIS — E782 Mixed hyperlipidemia: Secondary | ICD-10-CM

## 2012-11-30 DIAGNOSIS — R5381 Other malaise: Secondary | ICD-10-CM

## 2012-11-30 DIAGNOSIS — M2550 Pain in unspecified joint: Secondary | ICD-10-CM

## 2012-11-30 LAB — BASIC METABOLIC PANEL
BUN: 19 mg/dL (ref 6–23)
CO2: 28 mEq/L (ref 19–32)
Calcium: 9.2 mg/dL (ref 8.4–10.5)
Chloride: 105 mEq/L (ref 96–112)
Creatinine, Ser: 1 mg/dL (ref 0.4–1.5)
GFR: 76.95 mL/min (ref >60.00)
Glucose, Bld: 98 mg/dL (ref 70–99)
Potassium: 3.9 mEq/L (ref 3.5–5.1)
Sodium: 141 mEq/L (ref 135–145)

## 2012-11-30 LAB — HEPATIC FUNCTION PANEL
ALT: 28 U/L (ref 0–53)
AST: 26 U/L (ref 0–37)
Albumin: 4.1 g/dL (ref 3.5–5.2)
Alkaline Phosphatase: 55 U/L (ref 39–117)
Bilirubin, Direct: 0.1 mg/dL (ref 0.0–0.3)
Total Bilirubin: 1.1 mg/dL (ref 0.3–1.2)
Total Protein: 7.7 g/dL (ref 6.0–8.3)

## 2012-11-30 LAB — LIPID PANEL
Cholesterol: 186 mg/dL (ref 0–200)
HDL: 52.2 mg/dL (ref >39.00)
LDL Cholesterol: 113 mg/dL — ABNORMAL HIGH (ref 0–99)
Total CHOL/HDL Ratio: 4
Triglycerides: 105 mg/dL (ref 0.0–149.0)
VLDL: 21 mg/dL (ref 0.0–40.0)

## 2012-11-30 LAB — SEDIMENTATION RATE: Sed Rate: 6 mm/hr (ref 0–22)

## 2012-12-05 ENCOUNTER — Encounter: Payer: Self-pay | Admitting: Internal Medicine

## 2012-12-05 ENCOUNTER — Ambulatory Visit (INDEPENDENT_AMBULATORY_CARE_PROVIDER_SITE_OTHER): Payer: Medicare Other | Admitting: Internal Medicine

## 2012-12-05 VITALS — BP 110/72 | HR 76 | Temp 98.2°F | Resp 16 | Wt 180.0 lb

## 2012-12-05 DIAGNOSIS — R5381 Other malaise: Secondary | ICD-10-CM

## 2012-12-05 DIAGNOSIS — R5383 Other fatigue: Secondary | ICD-10-CM

## 2012-12-05 MED ORDER — OXYCODONE-ACETAMINOPHEN 5-325 MG PO TABS: 1.0000 | ORAL_TABLET | Freq: Four times a day (QID) | ORAL | Status: DC | PRN

## 2012-12-05 NOTE — Progress Notes (Signed)
   Subjective:    HPI  The patient presents for a follow-up of a new hypertension detected, chronic dyslipidemia, RA controlled partially with medicines. He was recently checked again by Dr Channing Mutters for severe neck OA, using neck traction now - better. Hydrocodone is not working too well. Oxycodone did work well. He started yoga.   C/o fatigue, RA pains  He is on arensia now (remicade stopped working).   Wt Readings from Last 3 Encounters:  12/05/12 180 lb (81.647 kg)  10/16/12 174 lb (78.926 kg)  09/20/12 178 lb (80.74 kg)   BP Readings from Last 3 Encounters:  12/05/12 110/72  11/13/12 174/92  10/16/12 139/70     Review of Systems  Constitutional: Negative for appetite change, fatigue and unexpected weight change.  HENT: Negative for nosebleeds, congestion, sore throat, sneezing, trouble swallowing and neck pain.   Eyes: Negative for itching and visual disturbance.  Respiratory: Negative for cough and shortness of breath.   Cardiovascular: Negative for chest pain, palpitations and leg swelling.  Gastrointestinal: Negative for nausea, diarrhea, blood in stool and abdominal distention.  Genitourinary: Negative for frequency and hematuria.  Musculoskeletal: Positive for arthralgias. Negative for back pain, joint swelling and gait problem.  Skin: Negative for rash.  Neurological: Negative for dizziness, tremors, speech difficulty and weakness.  Psychiatric/Behavioral: Negative for sleep disturbance, dysphoric mood and agitation. The patient is not nervous/anxious.        Objective:   Physical Exam  Constitutional: He is oriented to person, place, and time. He appears well-developed.  HENT:  Mouth/Throat: Oropharynx is clear and moist.  Eyes: Conjunctivae normal are normal. Pupils are equal, round, and reactive to light.  Neck: Normal range of motion. No JVD present. No thyromegaly present.  Cardiovascular: Normal rate, regular rhythm, normal heart sounds and intact distal  pulses.  Exam reveals no gallop and no friction rub.   No murmur heard. Pulmonary/Chest: Effort normal and breath sounds normal. No respiratory distress. He has no wheezes. He has no rales. He exhibits no tenderness.  Abdominal: Soft. Bowel sounds are normal. He exhibits no distension and no mass. There is no tenderness. There is no rebound and no guarding.  Musculoskeletal: Normal range of motion. He exhibits tenderness. He exhibits no edema.  Lymphadenopathy:    He has no cervical adenopathy.  Neurological: He is alert and oriented to person, place, and time. He has normal reflexes. No cranial nerve deficit. He exhibits normal muscle tone. Coordination normal.  Skin: Skin is warm and dry. No rash noted.  Psychiatric: He has a normal mood and affect. His behavior is normal. Judgment and thought content normal.   Lab Results  Component Value Date   WBC 4.3* 06/21/2012   HGB 13.2 06/21/2012   HCT 39.4 06/21/2012   PLT 259.0 06/21/2012   GLUCOSE 98 11/30/2012   CHOL 186 11/30/2012   TRIG 105.0 11/30/2012   HDL 52.20 11/30/2012   LDLDIRECT 119.2 04/13/2012   LDLCALC 113* 11/30/2012   ALT 28 11/30/2012   AST 26 11/30/2012   NA 141 11/30/2012   K 3.9 11/30/2012   CL 105 11/30/2012   CREATININE 1.0 11/30/2012   BUN 19 11/30/2012   CO2 28 11/30/2012   TSH 1.31 06/21/2012   PSA 1.52 06/04/2011   HGBA1C 6.3 12/08/2010          Assessment & Plan:

## 2012-12-05 NOTE — Assessment & Plan Note (Signed)
Better  

## 2012-12-08 ENCOUNTER — Other Ambulatory Visit (HOSPITAL_COMMUNITY): Payer: Self-pay | Admitting: *Deleted

## 2012-12-11 ENCOUNTER — Other Ambulatory Visit: Payer: Self-pay | Admitting: *Deleted

## 2012-12-11 ENCOUNTER — Encounter (HOSPITAL_COMMUNITY)
Admission: RE | Admit: 2012-12-11 | Discharge: 2012-12-11 | Disposition: A | Discharge status: Home / Self Care | Payer: Medicare Other | Source: Ambulatory Visit | Attending: Rheumatology | Admitting: Rheumatology

## 2012-12-11 DIAGNOSIS — M069 Rheumatoid arthritis, unspecified: Secondary | ICD-10-CM | POA: Insufficient documentation

## 2012-12-11 MED ORDER — ABATACEPT 250 MG IV SOLR
750.0000 mg | INTRAVENOUS | Status: DC
  Administered 2012-12-11: 750 mg via INTRAVENOUS
  Filled 2012-12-11: qty 30

## 2012-12-11 MED ORDER — OMEPRAZOLE 20 MG PO CPDR: 40.0000 mg | DELAYED_RELEASE_CAPSULE | Freq: Every day | ORAL | Status: DC

## 2012-12-11 MED ORDER — SODIUM CHLORIDE 0.9 % IV SOLN
INTRAVENOUS | Status: DC
  Administered 2012-12-11: 09:00:00 via INTRAVENOUS

## 2012-12-13 ENCOUNTER — Ambulatory Visit: Payer: Medicare Other | Admitting: Internal Medicine

## 2013-01-11 ENCOUNTER — Other Ambulatory Visit: Payer: Self-pay | Admitting: Internal Medicine

## 2013-01-15 ENCOUNTER — Encounter (HOSPITAL_COMMUNITY)
Admission: RE | Admit: 2013-01-15 | Discharge: 2013-01-15 | Disposition: A | Discharge status: Home / Self Care | Payer: Medicare Other | Source: Ambulatory Visit | Attending: Rheumatology | Admitting: Rheumatology

## 2013-01-15 DIAGNOSIS — M069 Rheumatoid arthritis, unspecified: Secondary | ICD-10-CM | POA: Insufficient documentation

## 2013-01-15 MED ORDER — SODIUM CHLORIDE 0.9 % IV SOLN
750.0000 mg | INTRAVENOUS | Status: DC
  Administered 2013-01-15: 750 mg via INTRAVENOUS
  Filled 2013-01-15: qty 30

## 2013-01-15 MED ORDER — SODIUM CHLORIDE 0.9 % IV SOLN
INTRAVENOUS | Status: DC
  Administered 2013-01-15: 09:00:00 via INTRAVENOUS

## 2013-02-09 ENCOUNTER — Other Ambulatory Visit (HOSPITAL_COMMUNITY): Payer: Self-pay | Admitting: *Deleted

## 2013-02-12 ENCOUNTER — Encounter (HOSPITAL_COMMUNITY)
Admission: RE | Admit: 2013-02-12 | Discharge: 2013-02-12 | Disposition: A | Discharge status: Home / Self Care | Payer: Medicare Other | Source: Ambulatory Visit | Attending: Rheumatology | Admitting: Rheumatology

## 2013-02-12 MED ORDER — SODIUM CHLORIDE 0.9 % IV SOLN
750.0000 mg | INTRAVENOUS | Status: DC
  Administered 2013-02-12: 750 mg via INTRAVENOUS
  Filled 2013-02-12: qty 30

## 2013-02-12 MED ORDER — SODIUM CHLORIDE 0.9 % IV SOLN
INTRAVENOUS | Status: DC
  Administered 2013-02-12: 09:00:00 via INTRAVENOUS

## 2013-02-13 ENCOUNTER — Encounter (HOSPITAL_COMMUNITY): Payer: Medicare Other

## 2013-03-06 ENCOUNTER — Ambulatory Visit (INDEPENDENT_AMBULATORY_CARE_PROVIDER_SITE_OTHER): Payer: Medicare Other | Admitting: Internal Medicine

## 2013-03-06 ENCOUNTER — Encounter: Payer: Self-pay | Admitting: Internal Medicine

## 2013-03-06 ENCOUNTER — Ambulatory Visit: Payer: Medicare Other | Admitting: Internal Medicine

## 2013-03-06 VITALS — BP 130/80 | HR 84 | Temp 98.0°F | Resp 16 | Wt 183.0 lb

## 2013-03-06 DIAGNOSIS — M069 Rheumatoid arthritis, unspecified: Secondary | ICD-10-CM

## 2013-03-06 DIAGNOSIS — R5383 Other fatigue: Secondary | ICD-10-CM

## 2013-03-06 DIAGNOSIS — R5381 Other malaise: Secondary | ICD-10-CM

## 2013-03-06 DIAGNOSIS — E782 Mixed hyperlipidemia: Secondary | ICD-10-CM

## 2013-03-06 NOTE — Assessment & Plan Note (Signed)
Take Norvasc at hs

## 2013-03-06 NOTE — Progress Notes (Signed)
   Subjective:    HPI  The patient presents for a follow-up of a new hypertension detected, chronic dyslipidemia, RA controlled partially with medicines. He was recently checked again by Dr Channing Mutters for severe neck OA, using neck traction now - better. Hydrocodone is not working too well. Oxycodone did work well. He started yoga.   C/o fatigue, RA pains - worse  He is on arensia now - not working (remicade stopped working as well). Dr Dareen Piano is changing Rx   Wt Readings from Last 3 Encounters:  03/06/13 183 lb (83.008 kg)  02/12/13 175 lb (79.379 kg)  01/15/13 175 lb (79.379 kg)   BP Readings from Last 3 Encounters:  03/06/13 130/80  02/12/13 146/78  01/15/13 147/80     Review of Systems  Constitutional: Negative for appetite change, fatigue and unexpected weight change.  HENT: Negative for nosebleeds, congestion, sore throat, sneezing, trouble swallowing and neck pain.   Eyes: Negative for itching and visual disturbance.  Respiratory: Negative for cough and shortness of breath.   Cardiovascular: Negative for chest pain, palpitations and leg swelling.  Gastrointestinal: Negative for nausea, diarrhea, blood in stool and abdominal distention.  Genitourinary: Negative for frequency and hematuria.  Musculoskeletal: Positive for arthralgias. Negative for back pain, joint swelling and gait problem.  Skin: Negative for rash.  Neurological: Negative for dizziness, tremors, speech difficulty and weakness.  Psychiatric/Behavioral: Negative for sleep disturbance, dysphoric mood and agitation. The patient is not nervous/anxious.        Objective:   Physical Exam  Constitutional: He is oriented to person, place, and time. He appears well-developed.  HENT:  Mouth/Throat: Oropharynx is clear and moist.  Eyes: Conjunctivae are normal. Pupils are equal, round, and reactive to light.  Neck: Normal range of motion. No JVD present. No thyromegaly present.  Cardiovascular: Normal rate, regular  rhythm, normal heart sounds and intact distal pulses.  Exam reveals no gallop and no friction rub.   No murmur heard. Pulmonary/Chest: Effort normal and breath sounds normal. No respiratory distress. He has no wheezes. He has no rales. He exhibits no tenderness.  Abdominal: Soft. Bowel sounds are normal. He exhibits no distension and no mass. There is no tenderness. There is no rebound and no guarding.  Musculoskeletal: Normal range of motion. He exhibits tenderness. He exhibits no edema.  Lymphadenopathy:    He has no cervical adenopathy.  Neurological: He is alert and oriented to person, place, and time. He has normal reflexes. No cranial nerve deficit. He exhibits normal muscle tone. Coordination normal.  Skin: Skin is warm and dry. No rash noted.  Psychiatric: He has a normal mood and affect. His behavior is normal. Judgment and thought content normal.   Lab Results  Component Value Date   WBC 4.3* 06/21/2012   HGB 13.2 06/21/2012   HCT 39.4 06/21/2012   PLT 259.0 06/21/2012   GLUCOSE 98 11/30/2012   CHOL 186 11/30/2012   TRIG 105.0 11/30/2012   HDL 52.20 11/30/2012   LDLDIRECT 119.2 04/13/2012   LDLCALC 113* 11/30/2012   ALT 28 11/30/2012   AST 26 11/30/2012   NA 141 11/30/2012   K 3.9 11/30/2012   CL 105 11/30/2012   CREATININE 1.0 11/30/2012   BUN 19 11/30/2012   CO2 28 11/30/2012   TSH 1.31 06/21/2012   PSA 1.52 06/04/2011   HGBA1C 6.3 12/08/2010          Assessment & Plan:

## 2013-03-06 NOTE — Assessment & Plan Note (Signed)
Dr Dareen Piano is changing his meds now

## 2013-03-06 NOTE — Assessment & Plan Note (Addendum)
We had to d/c Lipitor On cholestyramine tid per Dr Dareen Piano

## 2013-03-06 NOTE — Patient Instructions (Signed)
Take amlodipine at night.

## 2013-03-08 ENCOUNTER — Other Ambulatory Visit (HOSPITAL_COMMUNITY): Payer: Self-pay | Admitting: *Deleted

## 2013-03-12 ENCOUNTER — Encounter (HOSPITAL_COMMUNITY)
Admission: RE | Admit: 2013-03-12 | Discharge: 2013-03-12 | Disposition: A | Discharge status: Home / Self Care | Payer: Medicare Other | Source: Ambulatory Visit | Attending: Rheumatology | Admitting: Rheumatology

## 2013-03-12 DIAGNOSIS — M069 Rheumatoid arthritis, unspecified: Secondary | ICD-10-CM | POA: Insufficient documentation

## 2013-03-12 MED ORDER — SODIUM CHLORIDE 0.9 % IV SOLN
750.0000 mg | INTRAVENOUS | Status: DC
  Administered 2013-03-12: 750 mg via INTRAVENOUS
  Filled 2013-03-12: qty 30

## 2013-03-12 MED ORDER — SODIUM CHLORIDE 0.9 % IV SOLN
INTRAVENOUS | Status: DC
  Administered 2013-03-12: 09:00:00 via INTRAVENOUS

## 2013-04-10 ENCOUNTER — Encounter (HOSPITAL_COMMUNITY)
Admission: RE | Admit: 2013-04-10 | Discharge: 2013-04-10 | Disposition: A | Discharge status: Home / Self Care | Payer: Medicare Other | Source: Ambulatory Visit | Attending: Rheumatology | Admitting: Rheumatology

## 2013-04-10 DIAGNOSIS — M069 Rheumatoid arthritis, unspecified: Secondary | ICD-10-CM | POA: Insufficient documentation

## 2013-04-10 MED ORDER — SODIUM CHLORIDE 0.9 % IV SOLN
INTRAVENOUS | Status: AC
  Administered 2013-04-10: 250 mL via INTRAVENOUS

## 2013-04-10 MED ORDER — SODIUM CHLORIDE 0.9 % IV SOLN
750.0000 mg | INTRAVENOUS | Status: AC
  Administered 2013-04-10: 750 mg via INTRAVENOUS
  Filled 2013-04-10: qty 30

## 2013-04-13 ENCOUNTER — Other Ambulatory Visit: Payer: Self-pay

## 2013-04-13 MED ORDER — AMLODIPINE BESYLATE 5 MG PO TABS: 5.0000 mg | ORAL_TABLET | Freq: Every day | ORAL | Status: DC

## 2013-04-13 NOTE — Telephone Encounter (Signed)
amLODipine (NORVASC) 5 MG tablet  Take 5 mg by mouth daily.       Patient Instructions  Your physician wants you to follow-up in: 12 months with Dr Jacquiline Doe will receive a reminder letter in the mail two months in advance. If you don't receive a letter, please call our office to schedule the follow-up appointment. Dr Johney Frame has agreed to see [name] wife

## 2013-04-19 ENCOUNTER — Other Ambulatory Visit: Payer: Self-pay | Admitting: Emergency Medicine

## 2013-04-19 ENCOUNTER — Other Ambulatory Visit: Payer: Self-pay | Admitting: *Deleted

## 2013-04-19 MED ORDER — AMLODIPINE BESYLATE 5 MG PO TABS: 5.0000 mg | ORAL_TABLET | Freq: Every day | ORAL | Status: DC

## 2013-04-19 NOTE — Telephone Encounter (Signed)
[place] refilled medication.

## 2013-04-30 ENCOUNTER — Telehealth: Payer: Self-pay | Admitting: Internal Medicine

## 2013-04-30 NOTE — Telephone Encounter (Signed)
Spoke with pt

## 2013-04-30 NOTE — Telephone Encounter (Signed)
Pt due to see Dr Johney Frame May 2014, pt needs to schedule appt to continue med refills. Pt states he does not need refills at the present time. Pt forwarded to scheduler to schedule appt.

## 2013-04-30 NOTE — Telephone Encounter (Signed)
New problem   Pt stated he received a ltr from pharmacy to call the office but not sure what he needs to do

## 2013-05-07 ENCOUNTER — Encounter: Payer: Self-pay | Admitting: Internal Medicine

## 2013-05-07 ENCOUNTER — Ambulatory Visit (INDEPENDENT_AMBULATORY_CARE_PROVIDER_SITE_OTHER): Payer: Medicare Other | Admitting: Internal Medicine

## 2013-05-07 VITALS — BP 146/87 | HR 81 | Ht 72.0 in | Wt 184.4 lb

## 2013-05-07 DIAGNOSIS — R002 Palpitations: Secondary | ICD-10-CM

## 2013-05-07 DIAGNOSIS — E782 Mixed hyperlipidemia: Secondary | ICD-10-CM

## 2013-05-07 DIAGNOSIS — I10 Essential (primary) hypertension: Secondary | ICD-10-CM

## 2013-05-07 NOTE — Progress Notes (Signed)
PCP: Sonda Primes, MD  [name] is a 79 y.o. male who presents today for routine cardiology followup.  Since last being seen in our clinic, the patient reports doing very well.  He remains active and continues to exercise frequently.  He reports having pulsating and sharp R temporal pain 10 days ago.  This has since resolved.   Today, he denies symptoms of palpitations, chest pain, shortness of breath,  lower extremity edema, dizziness, presyncope, or syncope.  The patient is otherwise without complaint today.   Past Medical History  Diagnosis Date  . Colon polyp   . Hyperlipidemia   . Osteoarthritis   . Migraine with aura   . RA (rheumatoid arthritis)   . Premature atrial contractions   . GERD (gastroesophageal reflux disease)   . Hypertension    Past Surgical History  Procedure Laterality Date  . Back surgery  2004    Current Outpatient Prescriptions  Medication Sig Dispense Refill  . amLODipine (NORVASC) 5 MG tablet Take 1 tablet (5 mg total) by mouth daily.  90 tablet  0  . aspirin 81 MG tablet Take 81 mg by mouth daily.      . Cholecalciferol (VITAMIN D) 1000 UNITS capsule Take 1,000 Units by mouth daily.        . Cyanocobalamin 2500 MCG SUBL Place 1 tablet under the tongue daily.        . fexofenadine (ALLEGRA) 180 MG tablet Take 180 mg by mouth daily.        . fish oil-omega-3 fatty acids 1000 MG capsule Take 2 g by mouth daily.        [name] LORazepam (ATIVAN) 0.5 MG tablet TAKE ONE OR TWO TABLETS TWICE DAILY AS NEEDED FOR ANXIETY  60 tablet  5  . omeprazole (PRILOSEC) 20 MG capsule Take 2 capsules (40 mg total) by mouth daily.  180 capsule  1  . oxyCODONE-acetaminophen (ROXICET) 5-325 MG per tablet Take 1 tablet by mouth every 6 (six) hours as needed for pain. Please fill on or after 01/05/13  120 tablet  0  . sildenafil (VIAGRA) 100 MG tablet Take 100 mg by mouth daily as needed.        . zolpidem (AMBIEN) 10 MG tablet TAKE 1 TABLET AT BEDTIME AS NEEDED FOR  SLEEP  90 tablet   1   No current facility-administered medications for this visit.    Physical Exam: Filed Vitals:   05/07/13 1434  BP: 146/87  Pulse: 81  Height: 6' (1.829 m)  Weight: 184 lb 6.4 oz (83.643 kg)    GEN- The patient is well appearing, alert and oriented x 3 today.   Head- normocephalic, atraumatic Eyes-  Sclera clear, conjunctiva pink Ears- hearing intact Oropharynx- clear Lungs- Clear to ausculation bilaterally, normal work of breathing Heart- Regular rate and rhythm, no murmurs, rubs or gallops, PMI not laterally displaced GI- soft, NT, ND, + BS Extremities- no clubbing, cyanosis, or edema  ekg today reveals sinus rhythm, normal ekg  Assessment and Plan:  1. Palpitations Resolved  2. HTN Stable No change required today  3. HL Does not tolerate statins but is taking red yeast rice  4. Temporal pain- resolved, not tender on exam today IF he has any further temporal pain, I have instructed him to contact his rheumatologist as this could be an issue of temporal arteritis.  He agrees to do so.  Return to see me in 1year

## 2013-05-07 NOTE — Patient Instructions (Addendum)
Your physician wants you to follow-up in: 12 months with Dr Allred You will receive a reminder letter in the mail two months in advance. If you don't receive a letter, please call our office to schedule the follow-up appointment.  

## 2013-05-08 ENCOUNTER — Encounter (HOSPITAL_COMMUNITY)
Admission: RE | Admit: 2013-05-08 | Discharge: 2013-05-08 | Disposition: A | Discharge status: Home / Self Care | Payer: Medicare Other | Source: Ambulatory Visit | Attending: Rheumatology | Admitting: Rheumatology

## 2013-05-08 DIAGNOSIS — M069 Rheumatoid arthritis, unspecified: Secondary | ICD-10-CM | POA: Insufficient documentation

## 2013-05-08 MED ORDER — SODIUM CHLORIDE 0.9 % IV SOLN
INTRAVENOUS | Status: AC
  Administered 2013-05-08: 250 mL via INTRAVENOUS

## 2013-05-08 MED ORDER — SODIUM CHLORIDE 0.9 % IV SOLN
750.0000 mg | INTRAVENOUS | Status: AC
  Administered 2013-05-08: 750 mg via INTRAVENOUS
  Filled 2013-05-08: qty 30

## 2013-05-24 ENCOUNTER — Other Ambulatory Visit (INDEPENDENT_AMBULATORY_CARE_PROVIDER_SITE_OTHER): Payer: Medicare Other

## 2013-05-24 DIAGNOSIS — R5383 Other fatigue: Secondary | ICD-10-CM

## 2013-05-24 DIAGNOSIS — R5381 Other malaise: Secondary | ICD-10-CM

## 2013-05-24 DIAGNOSIS — E782 Mixed hyperlipidemia: Secondary | ICD-10-CM

## 2013-05-24 DIAGNOSIS — M069 Rheumatoid arthritis, unspecified: Secondary | ICD-10-CM

## 2013-05-24 LAB — BASIC METABOLIC PANEL
BUN: 16 mg/dL (ref 6–23)
CO2: 34 mEq/L — ABNORMAL HIGH (ref 19–32)
Calcium: 9.4 mg/dL (ref 8.4–10.5)
Chloride: 106 mEq/L (ref 96–112)
Creatinine, Ser: 0.8 mg/dL (ref 0.4–1.5)
GFR: 98.02 mL/min (ref >60.00)
Glucose, Bld: 96 mg/dL (ref 70–99)
Potassium: 4.2 mEq/L (ref 3.5–5.1)
Sodium: 140 mEq/L (ref 135–145)

## 2013-05-24 LAB — LIPID PANEL
Cholesterol: 241 mg/dL — ABNORMAL HIGH (ref 0–200)
HDL: 43.2 mg/dL (ref >39.00)
Total CHOL/HDL Ratio: 6
Triglycerides: 180 mg/dL — ABNORMAL HIGH (ref 0.0–149.0)
VLDL: 36 mg/dL (ref 0.0–40.0)

## 2013-05-24 LAB — HEPATIC FUNCTION PANEL
ALT: 21 U/L (ref 0–53)
AST: 22 U/L (ref 0–37)
Albumin: 3.9 g/dL (ref 3.5–5.2)
Alkaline Phosphatase: 66 U/L (ref 39–117)
Bilirubin, Direct: 0.1 mg/dL (ref 0.0–0.3)
Total Bilirubin: 0.8 mg/dL (ref 0.3–1.2)
Total Protein: 7.6 g/dL (ref 6.0–8.3)

## 2013-05-24 LAB — TSH: TSH: 1.66 u[IU]/mL (ref 0.35–5.50)

## 2013-05-24 LAB — CK: Total CK: 114 U/L (ref 7–232)

## 2013-05-24 LAB — LDL CHOLESTEROL, DIRECT: Direct LDL: 168.9 mg/dL

## 2013-05-28 ENCOUNTER — Encounter: Payer: Self-pay | Admitting: Internal Medicine

## 2013-05-28 ENCOUNTER — Ambulatory Visit (INDEPENDENT_AMBULATORY_CARE_PROVIDER_SITE_OTHER): Payer: Medicare Other | Admitting: Internal Medicine

## 2013-05-28 VITALS — BP 128/84 | HR 80 | Temp 98.2°F | Resp 16 | Wt 184.0 lb

## 2013-05-28 DIAGNOSIS — I10 Essential (primary) hypertension: Secondary | ICD-10-CM

## 2013-05-28 DIAGNOSIS — M069 Rheumatoid arthritis, unspecified: Secondary | ICD-10-CM

## 2013-05-28 DIAGNOSIS — M545 Low back pain, unspecified: Secondary | ICD-10-CM

## 2013-05-28 DIAGNOSIS — E782 Mixed hyperlipidemia: Secondary | ICD-10-CM

## 2013-05-28 DIAGNOSIS — IMO0001 Reserved for inherently not codable concepts without codable children: Secondary | ICD-10-CM

## 2013-05-28 MED ORDER — OXYCODONE-ACETAMINOPHEN 5-325 MG PO TABS: 1.0000 | ORAL_TABLET | Freq: Four times a day (QID) | ORAL | Status: DC | PRN

## 2013-05-28 NOTE — Assessment & Plan Note (Signed)
Off Remicade; now on [place]

## 2013-05-28 NOTE — Assessment & Plan Note (Signed)
Continue with current prescription therapy as reflected on the Med list.  

## 2013-05-28 NOTE — Assessment & Plan Note (Signed)
Better  

## 2013-05-28 NOTE — Progress Notes (Signed)
   Subjective:    HPI  C/o family stress  The patient presents for a follow-up of a new hypertension detected, chronic dyslipidemia, RA controlled partially with medicines. He was recently checked again by Dr Channing Mutters for severe neck OA, using neck traction now - better. Hydrocodone is not working too well. Oxycodone did work well. He started yoga.   C/o fatigue, RA pains - worse due to stress  He is on arensia now - not working (remicade stopped working as well). Dr Dareen Piano is changing Rx   Wt Readings from Last 3 Encounters:  05/28/13 184 lb (83.462 kg)  05/08/13 180 lb (81.647 kg)  05/07/13 184 lb 6.4 oz (83.643 kg)   BP Readings from Last 3 Encounters:  05/28/13 128/84  05/08/13 160/78  05/07/13 146/87     Review of Systems  Constitutional: Negative for appetite change, fatigue and unexpected weight change.  HENT: Negative for nosebleeds, congestion, sore throat, sneezing, trouble swallowing and neck pain.   Eyes: Negative for itching and visual disturbance.  Respiratory: Negative for cough and shortness of breath.   Cardiovascular: Negative for chest pain, palpitations and leg swelling.  Gastrointestinal: Negative for nausea, diarrhea, blood in stool and abdominal distention.  Genitourinary: Negative for frequency and hematuria.  Musculoskeletal: Positive for arthralgias. Negative for back pain, joint swelling and gait problem.  Skin: Negative for rash.  Neurological: Negative for dizziness, tremors, speech difficulty and weakness.  Psychiatric/Behavioral: Negative for sleep disturbance, dysphoric mood and agitation. The patient is not nervous/anxious.        Objective:   Physical Exam  Constitutional: He is oriented to person, place, and time. He appears well-developed.  HENT:  Mouth/Throat: Oropharynx is clear and moist.  Eyes: Conjunctivae are normal. Pupils are equal, round, and reactive to light.  Neck: Normal range of motion. No JVD present. No thyromegaly  present.  Cardiovascular: Normal rate, regular rhythm, normal heart sounds and intact distal pulses.  Exam reveals no gallop and no friction rub.   No murmur heard. Pulmonary/Chest: Effort normal and breath sounds normal. No respiratory distress. He has no wheezes. He has no rales. He exhibits no tenderness.  Abdominal: Soft. Bowel sounds are normal. He exhibits no distension and no mass. There is no tenderness. There is no rebound and no guarding.  Musculoskeletal: Normal range of motion. He exhibits tenderness. He exhibits no edema.  Lymphadenopathy:    He has no cervical adenopathy.  Neurological: He is alert and oriented to person, place, and time. He has normal reflexes. No cranial nerve deficit. He exhibits normal muscle tone. Coordination normal.  Skin: Skin is warm and dry. No rash noted.  Psychiatric: He has a normal mood and affect. His behavior is normal. Judgment and thought content normal.   Lab Results  Component Value Date   WBC 4.3* 06/21/2012   HGB 13.2 06/21/2012   HCT 39.4 06/21/2012   PLT 259.0 06/21/2012   GLUCOSE 96 05/24/2013   CHOL 241* 05/24/2013   TRIG 180.0* 05/24/2013   HDL 43.20 05/24/2013   LDLDIRECT 168.9 05/24/2013   LDLCALC 113* 11/30/2012   ALT 21 05/24/2013   AST 22 05/24/2013   NA 140 05/24/2013   K 4.2 05/24/2013   CL 106 05/24/2013   CREATININE 0.8 05/24/2013   BUN 16 05/24/2013   CO2 34* 05/24/2013   TSH 1.66 05/24/2013   PSA 1.52 06/04/2011   HGBA1C 6.3 12/08/2010          Assessment & Plan:

## 2013-05-29 ENCOUNTER — Other Ambulatory Visit: Payer: Self-pay | Admitting: Dermatology

## 2013-06-01 ENCOUNTER — Encounter (HOSPITAL_COMMUNITY)
Admission: RE | Admit: 2013-06-01 | Discharge: 2013-06-01 | Disposition: A | Discharge status: Home / Self Care | Payer: Medicare Other | Source: Ambulatory Visit | Attending: Rheumatology | Admitting: Rheumatology

## 2013-06-01 DIAGNOSIS — M069 Rheumatoid arthritis, unspecified: Secondary | ICD-10-CM | POA: Insufficient documentation

## 2013-06-01 MED ORDER — SODIUM CHLORIDE 0.9 % IV SOLN
INTRAVENOUS | Status: DC
  Administered 2013-06-01: 10:00:00 via INTRAVENOUS

## 2013-06-01 MED ORDER — SODIUM CHLORIDE 0.9 % IV SOLN
750.0000 mg | INTRAVENOUS | Status: DC
  Administered 2013-06-01: 750 mg via INTRAVENOUS
  Filled 2013-06-01: qty 30

## 2013-06-04 ENCOUNTER — Ambulatory Visit: Payer: Medicare Other | Admitting: Internal Medicine

## 2013-06-18 ENCOUNTER — Other Ambulatory Visit: Payer: Self-pay | Admitting: Internal Medicine

## 2013-06-18 ENCOUNTER — Ambulatory Visit (INDEPENDENT_AMBULATORY_CARE_PROVIDER_SITE_OTHER): Payer: Medicare Other | Admitting: Internal Medicine

## 2013-06-18 ENCOUNTER — Encounter: Payer: Self-pay | Admitting: Internal Medicine

## 2013-06-18 VITALS — BP 150/90 | HR 80 | Temp 98.1°F | Resp 16 | Wt 185.0 lb

## 2013-06-18 DIAGNOSIS — M545 Low back pain, unspecified: Secondary | ICD-10-CM

## 2013-06-18 DIAGNOSIS — M2550 Pain in unspecified joint: Secondary | ICD-10-CM

## 2013-06-18 DIAGNOSIS — F4321 Adjustment disorder with depressed mood: Secondary | ICD-10-CM

## 2013-06-18 MED ORDER — DULOXETINE HCL 30 MG PO CPEP: 30.0000 mg | ORAL_CAPSULE | Freq: Every day | ORAL | Status: DC

## 2013-06-18 NOTE — Progress Notes (Signed)
Subjective:    HPI  C/o family stress - worse: pain is worse; can't sleep... The patient presents for a follow-up of a new hypertension detected, chronic dyslipidemia, RA controlled partially with medicines. He was recently checked again by Dr Channing Mutters for severe neck OA, using neck traction now - better. Hydrocodone is not working too well. Oxycodone did work well. He started yoga.   C/o fatigue, RA pains - worse due to stress  He is on arensia now - not working (remicade stopped working as well). Dr Dareen Piano is changing Rx   Wt Readings from Last 3 Encounters:  06/18/13 185 lb (83.915 kg)  06/01/13 180 lb (81.647 kg)  05/28/13 184 lb (83.462 kg)   BP Readings from Last 3 Encounters:  06/18/13 150/90  06/01/13 143/78  05/28/13 128/84     Review of Systems  Constitutional: Negative for appetite change, fatigue and unexpected weight change.  HENT: Negative for nosebleeds, congestion, sore throat, sneezing, trouble swallowing and neck pain.   Eyes: Negative for itching and visual disturbance.  Respiratory: Negative for cough and shortness of breath.   Cardiovascular: Negative for chest pain, palpitations and leg swelling.  Gastrointestinal: Negative for nausea, diarrhea, blood in stool and abdominal distention.  Genitourinary: Negative for frequency and hematuria.  Musculoskeletal: Positive for arthralgias. Negative for back pain, joint swelling and gait problem.  Skin: Negative for rash.  Neurological: Negative for dizziness, tremors, speech difficulty and weakness.  Psychiatric/Behavioral: Positive for sleep disturbance. Negative for suicidal ideas, dysphoric mood and agitation. The patient is nervous/anxious.        Objective:   Physical Exam  Constitutional: He is oriented to person, place, and time. He appears well-developed.  HENT:  Mouth/Throat: Oropharynx is clear and moist.  Eyes: Conjunctivae are normal. Pupils are equal, round, and reactive to light.  Neck:  Normal range of motion. No JVD present. No thyromegaly present.  Cardiovascular: Normal rate, regular rhythm, normal heart sounds and intact distal pulses.  Exam reveals no gallop and no friction rub.   No murmur heard. Pulmonary/Chest: Effort normal and breath sounds normal. No respiratory distress. He has no wheezes. He has no rales. He exhibits no tenderness.  Abdominal: Soft. Bowel sounds are normal. He exhibits no distension and no mass. There is no tenderness. There is no rebound and no guarding.  Musculoskeletal: Normal range of motion. He exhibits tenderness. He exhibits no edema.  Lymphadenopathy:    He has no cervical adenopathy.  Neurological: He is alert and oriented to person, place, and time. He has normal reflexes. No cranial nerve deficit. He exhibits normal muscle tone. Coordination normal.  Skin: Skin is warm and dry. No rash noted.  Psychiatric: He has a normal mood and affect. His behavior is normal. Judgment and thought content normal.   Lab Results  Component Value Date   WBC 4.3* 06/21/2012   HGB 13.2 06/21/2012   HCT 39.4 06/21/2012   PLT 259.0 06/21/2012   GLUCOSE 96 05/24/2013   CHOL 241* 05/24/2013   TRIG 180.0* 05/24/2013   HDL 43.20 05/24/2013   LDLDIRECT 168.9 05/24/2013   LDLCALC 113* 11/30/2012   ALT 21 05/24/2013   AST 22 05/24/2013   NA 140 05/24/2013   K 4.2 05/24/2013   CL 106 05/24/2013   CREATININE 0.8 05/24/2013   BUN 16 05/24/2013   CO2 34* 05/24/2013   TSH 1.66 05/24/2013   PSA 1.52 06/04/2011   HGBA1C 6.3 12/08/2010  Assessment & Plan:

## 2013-06-18 NOTE — Assessment & Plan Note (Signed)
8/14 worse Discussed. Start Cymbalta

## 2013-06-18 NOTE — Assessment & Plan Note (Signed)
Continue with current prescription therapy as reflected on the Med list.  

## 2013-06-18 NOTE — Assessment & Plan Note (Signed)
Cymbalta should help

## 2013-06-28 ENCOUNTER — Other Ambulatory Visit (HOSPITAL_COMMUNITY): Payer: Self-pay | Admitting: *Deleted

## 2013-06-29 ENCOUNTER — Encounter (HOSPITAL_COMMUNITY)
Admission: RE | Admit: 2013-06-29 | Discharge: 2013-06-29 | Disposition: A | Discharge status: Home / Self Care | Payer: Medicare Other | Source: Ambulatory Visit | Attending: Rheumatology | Admitting: Rheumatology

## 2013-06-29 DIAGNOSIS — M069 Rheumatoid arthritis, unspecified: Secondary | ICD-10-CM | POA: Insufficient documentation

## 2013-06-29 MED ORDER — SODIUM CHLORIDE 0.9 % IV SOLN
INTRAVENOUS | Status: DC
  Administered 2013-06-29: 10:00:00 via INTRAVENOUS

## 2013-06-29 MED ORDER — SODIUM CHLORIDE 0.9 % IV SOLN
750.0000 mg | INTRAVENOUS | Status: DC
  Administered 2013-06-29: 750 mg via INTRAVENOUS
  Filled 2013-06-29: qty 30

## 2013-07-13 ENCOUNTER — Other Ambulatory Visit: Payer: Self-pay | Admitting: *Deleted

## 2013-07-13 MED ORDER — AMLODIPINE BESYLATE 5 MG PO TABS: 5.0000 mg | ORAL_TABLET | Freq: Every day | ORAL | Status: DC

## 2013-07-27 ENCOUNTER — Encounter (HOSPITAL_COMMUNITY)
Admission: RE | Admit: 2013-07-27 | Discharge: 2013-07-27 | Disposition: A | Discharge status: Home / Self Care | Payer: Medicare Other | Source: Ambulatory Visit | Attending: Rheumatology | Admitting: Rheumatology

## 2013-07-27 DIAGNOSIS — M069 Rheumatoid arthritis, unspecified: Secondary | ICD-10-CM | POA: Insufficient documentation

## 2013-07-27 MED ORDER — SODIUM CHLORIDE 0.9 % IV SOLN
750.0000 mg | INTRAVENOUS | Status: DC
  Administered 2013-07-27: 750 mg via INTRAVENOUS
  Filled 2013-07-27: qty 30

## 2013-07-27 MED ORDER — SODIUM CHLORIDE 0.9 % IV SOLN
INTRAVENOUS | Status: DC
  Administered 2013-07-27: 09:00:00 via INTRAVENOUS

## 2013-08-06 ENCOUNTER — Ambulatory Visit (INDEPENDENT_AMBULATORY_CARE_PROVIDER_SITE_OTHER): Payer: Medicare Other | Admitting: Internal Medicine

## 2013-08-06 VITALS — BP 166/88 | HR 84 | Temp 98.2°F | Resp 12 | Wt 183.0 lb

## 2013-08-06 DIAGNOSIS — M06222 Rheumatoid bursitis, left elbow: Secondary | ICD-10-CM

## 2013-08-06 DIAGNOSIS — Z23 Encounter for immunization: Secondary | ICD-10-CM

## 2013-08-06 DIAGNOSIS — M069 Rheumatoid arthritis, unspecified: Secondary | ICD-10-CM

## 2013-08-06 DIAGNOSIS — I10 Essential (primary) hypertension: Secondary | ICD-10-CM

## 2013-08-06 MED ORDER — LORAZEPAM 0.5 MG PO TABS: ORAL_TABLET | ORAL | Status: DC

## 2013-08-06 NOTE — Progress Notes (Signed)
Subjective:     Patient ID: [name], male   DOB: [birth year], 79 y.o.   MRN: [id number]  HPI  C/o L elbow swelling Review of Systems  Constitutional: Negative for fever and chills.  Musculoskeletal: Positive for arthralgias.  Psychiatric/Behavioral: The patient is not nervous/anxious.        Objective:   Physical Exam  Constitutional: He appears well-developed. No distress.  Musculoskeletal: He exhibits no edema and no tenderness.  Skin: No erythema.   L olecr bursa with large NT swelling  Options to treat were discussed. She elected aspiration/steroids   Procedure Note :    Procedure :L elbow  bursa aspiration and steroid injection   Indication: Bursitis with refractory  chronic pain.   Risks including unsuccessful procedure , bleeding, infection, bruising, skin atrophy and others were explained to the patient in detail as well as the benefits. Informed consent was obtained and signed.   Tthe patient was placed in a comfortable position. Skin was prepped with Betadine and alcohol  and anesthetized with a cooling spray. Then, a 3 cc syringe with a 1 inch long 25-gauge needle was used for a skin over bursa injection 1 mL of 2% lidocaine. 21 gauge 1 inch needle on a 10 ml syringe was used to aspirate 11 ml of reddish sero-sanguinous fluid (discarded). Then, 20 mg of Depo-Medrol and 1 cc Lido was injected in the bursa .  Band-Aid was applied. ACE wrap.   Tolerated well. Complications: None. Good pain relief following the procedure.   Postprocedure instructions :    A Band-Aid should be left on for 12 hours. Injection therapy is not a cure itself. It is used in conjunction with other modalities. You can use nonsteroidal anti-inflammatories like ibuprofen , hot and cold compresses. Rest is recommended in the next 24 hours. You need to report immediately  if fever, chills or any signs of infection develop.     Assessment:         Plan:

## 2013-08-08 ENCOUNTER — Encounter: Payer: Self-pay | Admitting: Internal Medicine

## 2013-08-08 DIAGNOSIS — M06222 Rheumatoid bursitis, left elbow: Secondary | ICD-10-CM | POA: Insufficient documentation

## 2013-08-08 MED ORDER — METHYLPREDNISOLONE ACETATE 20 MG/ML IJ SUSP: 20.0000 mg | Freq: Once | INTRAMUSCULAR | Status: DC

## 2013-08-08 NOTE — Assessment & Plan Note (Addendum)
Options were discussed See Procedure ACE wrap

## 2013-08-08 NOTE — Assessment & Plan Note (Signed)
Check BP at home Continue with current prescription therapy as reflected on the Med list.  

## 2013-08-24 ENCOUNTER — Encounter (HOSPITAL_COMMUNITY)
Admission: RE | Admit: 2013-08-24 | Discharge: 2013-08-24 | Disposition: A | Discharge status: Home / Self Care | Payer: Medicare Other | Source: Ambulatory Visit | Attending: Rheumatology | Admitting: Rheumatology

## 2013-08-24 ENCOUNTER — Other Ambulatory Visit: Payer: Self-pay | Admitting: Internal Medicine

## 2013-08-24 DIAGNOSIS — M069 Rheumatoid arthritis, unspecified: Secondary | ICD-10-CM

## 2013-08-24 MED ORDER — SODIUM CHLORIDE 0.9 % IV SOLN
INTRAVENOUS | Status: DC
  Administered 2013-08-24: 09:00:00 via INTRAVENOUS

## 2013-08-24 MED ORDER — SODIUM CHLORIDE 0.9 % IV SOLN
750.0000 mg | INTRAVENOUS | Status: DC
  Administered 2013-08-24: 750 mg via INTRAVENOUS
  Filled 2013-08-24: qty 30

## 2013-08-27 ENCOUNTER — Ambulatory Visit: Payer: Medicare Other | Admitting: Internal Medicine

## 2013-08-28 ENCOUNTER — Ambulatory Visit: Payer: Medicare Other | Admitting: Internal Medicine

## 2013-09-18 ENCOUNTER — Other Ambulatory Visit: Payer: Self-pay | Admitting: Internal Medicine

## 2013-09-21 ENCOUNTER — Encounter (HOSPITAL_COMMUNITY)
Admission: RE | Admit: 2013-09-21 | Discharge: 2013-09-21 | Disposition: A | Discharge status: Home / Self Care | Payer: Medicare Other | Source: Ambulatory Visit | Attending: Rheumatology | Admitting: Rheumatology

## 2013-09-21 DIAGNOSIS — M069 Rheumatoid arthritis, unspecified: Secondary | ICD-10-CM | POA: Insufficient documentation

## 2013-09-21 MED ORDER — SODIUM CHLORIDE 0.9 % IV SOLN
INTRAVENOUS | Status: DC
  Administered 2013-09-21: 10:00:00 via INTRAVENOUS

## 2013-09-21 MED ORDER — SODIUM CHLORIDE 0.9 % IV SOLN
750.0000 mg | INTRAVENOUS | Status: DC
  Administered 2013-09-21: 750 mg via INTRAVENOUS
  Filled 2013-09-21: qty 30

## 2013-09-21 NOTE — Progress Notes (Signed)
                                                                                                                                                                                                                                                                                                                                                                                                                                                                                                                                                                                                                                                                                                   22 g angiocath inserted right hand on second attempt. Tol well

## 2013-09-21 NOTE — Progress Notes (Signed)
IV dc'd intact prior to discharge by Recardo Evangelist, NT. Tolerated well.

## 2013-10-02 ENCOUNTER — Encounter: Payer: Self-pay | Admitting: Internal Medicine

## 2013-10-02 ENCOUNTER — Ambulatory Visit (INDEPENDENT_AMBULATORY_CARE_PROVIDER_SITE_OTHER): Payer: Medicare Other | Admitting: Internal Medicine

## 2013-10-02 VITALS — BP 150/84 | HR 76 | Temp 97.1°F | Resp 16 | Wt 186.0 lb

## 2013-10-02 DIAGNOSIS — F4321 Adjustment disorder with depressed mood: Secondary | ICD-10-CM

## 2013-10-02 DIAGNOSIS — M069 Rheumatoid arthritis, unspecified: Secondary | ICD-10-CM

## 2013-10-02 DIAGNOSIS — I10 Essential (primary) hypertension: Secondary | ICD-10-CM

## 2013-10-02 DIAGNOSIS — Z23 Encounter for immunization: Secondary | ICD-10-CM

## 2013-10-02 DIAGNOSIS — M545 Low back pain, unspecified: Secondary | ICD-10-CM

## 2013-10-02 MED ORDER — OXYCODONE-ACETAMINOPHEN 5-325 MG PO TABS: 1.0000 | ORAL_TABLET | Freq: Four times a day (QID) | ORAL | Status: DC | PRN

## 2013-10-02 MED ORDER — DULOXETINE HCL 60 MG PO CPEP: 60.0000 mg | ORAL_CAPSULE | Freq: Every day | ORAL | Status: DC

## 2013-10-02 NOTE — Assessment & Plan Note (Signed)
Continue with current prescription therapy as reflected on the Med list.  

## 2013-10-02 NOTE — Progress Notes (Signed)
Subjective:    HPI  F/u family stress - worse: pain is worse; better sleep - better... The patient presents for a follow-up of a new hypertension detected, chronic dyslipidemia, RA controlled partially with medicines. He was recently checked again by Dr Channing Mutters for severe neck OA, using neck traction now - better. Hydrocodone is not working too well. Oxycodone did work well. .   F/u fatigue, RA pains - worse due to stress  He is on arensia now - not working (remicade stopped working as well). Dr Dareen Piano is treating RA   Wt Readings from Last 3 Encounters:  10/02/13 186 lb (84.369 kg)  09/21/13 180 lb (81.647 kg)  08/24/13 180 lb (81.647 kg)   BP Readings from Last 3 Encounters:  10/02/13 150/84  09/21/13 161/85  08/24/13 153/80     Review of Systems  Constitutional: Negative for appetite change, fatigue and unexpected weight change.  HENT: Negative for congestion, nosebleeds, sneezing, sore throat and trouble swallowing.   Eyes: Negative for itching and visual disturbance.  Respiratory: Negative for cough and shortness of breath.   Cardiovascular: Negative for chest pain, palpitations and leg swelling.  Gastrointestinal: Negative for nausea, diarrhea, blood in stool and abdominal distention.  Genitourinary: Negative for frequency and hematuria.  Musculoskeletal: Positive for arthralgias. Negative for back pain, gait problem, joint swelling and neck pain.  Skin: Negative for rash.  Neurological: Negative for dizziness, tremors, speech difficulty and weakness.  Psychiatric/Behavioral: Positive for sleep disturbance. Negative for suicidal ideas, dysphoric mood and agitation. The patient is nervous/anxious.        Objective:   Physical Exam  Constitutional: He is oriented to person, place, and time. He appears well-developed.  HENT:  Mouth/Throat: Oropharynx is clear and moist.  Eyes: Conjunctivae are normal. Pupils are equal, round, and reactive to light.  Neck: Normal  range of motion. No JVD present. No thyromegaly present.  Cardiovascular: Normal rate, regular rhythm, normal heart sounds and intact distal pulses.  Exam reveals no gallop and no friction rub.   No murmur heard. Pulmonary/Chest: Effort normal and breath sounds normal. No respiratory distress. He has no wheezes. He has no rales. He exhibits no tenderness.  Abdominal: Soft. Bowel sounds are normal. He exhibits no distension and no mass. There is no tenderness. There is no rebound and no guarding.  Musculoskeletal: Normal range of motion. He exhibits tenderness. He exhibits no edema.  Lymphadenopathy:    He has no cervical adenopathy.  Neurological: He is alert and oriented to person, place, and time. He has normal reflexes. No cranial nerve deficit. He exhibits normal muscle tone. Coordination normal.  Skin: Skin is warm and dry. No rash noted.  Psychiatric: He has a normal mood and affect. His behavior is normal. Judgment and thought content normal.  LS sensitive w/ROM  Lab Results  Component Value Date   WBC 4.3* 06/21/2012   HGB 13.2 06/21/2012   HCT 39.4 06/21/2012   PLT 259.0 06/21/2012   GLUCOSE 96 05/24/2013   CHOL 241* 05/24/2013   TRIG 180.0* 05/24/2013   HDL 43.20 05/24/2013   LDLDIRECT 168.9 05/24/2013   LDLCALC 113* 11/30/2012   ALT 21 05/24/2013   AST 22 05/24/2013   NA 140 05/24/2013   K 4.2 05/24/2013   CL 106 05/24/2013   CREATININE 0.8 05/24/2013   BUN 16 05/24/2013   CO2 34* 05/24/2013   TSH 1.66 05/24/2013   PSA 1.52 06/04/2011   HGBA1C 6.3 12/08/2010      Procedure  Note :          Assessment & Plan:

## 2013-10-02 NOTE — Assessment & Plan Note (Signed)
Better Will increase Cymbalta to 60 mg/d

## 2013-10-02 NOTE — Progress Notes (Signed)
Pre visit review using our clinic review tool, if applicable. No additional management support is needed unless otherwise documented below in the visit note. 

## 2013-10-05 ENCOUNTER — Other Ambulatory Visit (INDEPENDENT_AMBULATORY_CARE_PROVIDER_SITE_OTHER): Payer: Medicare Other

## 2013-10-05 DIAGNOSIS — M069 Rheumatoid arthritis, unspecified: Secondary | ICD-10-CM

## 2013-10-05 DIAGNOSIS — E782 Mixed hyperlipidemia: Secondary | ICD-10-CM

## 2013-10-05 LAB — BASIC METABOLIC PANEL
BUN: 16 mg/dL (ref 6–23)
CO2: 30 mEq/L (ref 19–32)
Calcium: 9.4 mg/dL (ref 8.4–10.5)
Chloride: 104 mEq/L (ref 96–112)
Creatinine, Ser: 0.8 mg/dL (ref 0.4–1.5)
GFR: 103.82 mL/min (ref >60.00)
Glucose, Bld: 103 mg/dL — ABNORMAL HIGH (ref 70–99)
Potassium: 4.2 mEq/L (ref 3.5–5.1)
Sodium: 139 mEq/L (ref 135–145)

## 2013-10-08 LAB — NMR LIPOPROFILE WITHOUT LIPIDS
HDL Particle Number: 34.8 umol/L (ref >=30.5)
HDL Size: 8.7 nm — ABNORMAL LOW (ref >=9.2)
LDL Particle Number: 2690 nmol/L — ABNORMAL HIGH (ref <1000)
LDL Size: 21.1 nm (ref >20.5)
LP-IR Score: 72 — ABNORMAL HIGH (ref <=45)
Large HDL-P: 4.4 umol/L — ABNORMAL LOW (ref >=4.8)
Large VLDL-P: 3.8 nmol/L — ABNORMAL HIGH (ref <=2.7)
Small LDL Particle Number: 1233 nmol/L — ABNORMAL HIGH (ref <=527)
VLDL Size: 54.6 nm — ABNORMAL HIGH (ref <=46.6)

## 2013-10-19 ENCOUNTER — Encounter (HOSPITAL_COMMUNITY)
Admission: RE | Admit: 2013-10-19 | Discharge: 2013-10-19 | Disposition: A | Discharge status: Home / Self Care | Payer: Medicare Other | Source: Ambulatory Visit | Attending: Rheumatology | Admitting: Rheumatology

## 2013-10-19 DIAGNOSIS — M069 Rheumatoid arthritis, unspecified: Secondary | ICD-10-CM | POA: Insufficient documentation

## 2013-10-19 MED ORDER — ABATACEPT 250 MG IV SOLR
750.0000 mg | INTRAVENOUS | Status: DC
  Administered 2013-10-19: 750 mg via INTRAVENOUS
  Filled 2013-10-19: qty 30

## 2013-10-19 MED ORDER — SODIUM CHLORIDE 0.9 % IV SOLN
INTRAVENOUS | Status: DC
  Administered 2013-10-19: 09:00:00 via INTRAVENOUS

## 2013-10-24 ENCOUNTER — Other Ambulatory Visit: Payer: Self-pay

## 2013-10-24 MED ORDER — AMLODIPINE BESYLATE 5 MG PO TABS: 5.0000 mg | ORAL_TABLET | Freq: Every day | ORAL | Status: DC

## 2013-11-15 HISTORY — PX: CATARACT EXTRACTION W/ INTRAOCULAR LENS  IMPLANT, BILATERAL: SHX1307

## 2013-11-16 ENCOUNTER — Encounter (HOSPITAL_COMMUNITY)
Admission: RE | Admit: 2013-11-16 | Discharge: 2013-11-16 | Disposition: A | Discharge status: Home / Self Care | Payer: Medicare Other | Source: Ambulatory Visit | Attending: Rheumatology | Admitting: Rheumatology

## 2013-11-16 DIAGNOSIS — M069 Rheumatoid arthritis, unspecified: Secondary | ICD-10-CM | POA: Insufficient documentation

## 2013-11-16 MED ORDER — SODIUM CHLORIDE 0.9 % IV SOLN
750.0000 mg | INTRAVENOUS | Status: DC
  Administered 2013-11-16: 750 mg via INTRAVENOUS
  Filled 2013-11-16: qty 30

## 2013-11-16 MED ORDER — SODIUM CHLORIDE 0.9 % IV SOLN
INTRAVENOUS | Status: DC
  Administered 2013-11-16: 09:00:00 via INTRAVENOUS

## 2013-11-27 ENCOUNTER — Other Ambulatory Visit: Payer: Self-pay | Admitting: Dermatology

## 2013-11-27 DIAGNOSIS — L821 Other seborrheic keratosis: Secondary | ICD-10-CM | POA: Diagnosis not present

## 2013-11-27 DIAGNOSIS — L819 Disorder of pigmentation, unspecified: Secondary | ICD-10-CM | POA: Diagnosis not present

## 2013-11-27 DIAGNOSIS — Z85828 Personal history of other malignant neoplasm of skin: Secondary | ICD-10-CM | POA: Diagnosis not present

## 2013-11-27 DIAGNOSIS — D1801 Hemangioma of skin and subcutaneous tissue: Secondary | ICD-10-CM | POA: Diagnosis not present

## 2013-11-27 DIAGNOSIS — L570 Actinic keratosis: Secondary | ICD-10-CM | POA: Diagnosis not present

## 2013-11-27 DIAGNOSIS — D485 Neoplasm of uncertain behavior of skin: Secondary | ICD-10-CM | POA: Diagnosis not present

## 2013-11-28 ENCOUNTER — Other Ambulatory Visit: Payer: Self-pay

## 2013-11-28 MED ORDER — AMLODIPINE BESYLATE 5 MG PO TABS: 5.0000 mg | ORAL_TABLET | Freq: Every day | ORAL | Status: DC

## 2013-12-16 DIAGNOSIS — I2510 Atherosclerotic heart disease of native coronary artery without angina pectoris: Secondary | ICD-10-CM

## 2013-12-16 HISTORY — DX: Atherosclerotic heart disease of native coronary artery without angina pectoris: I25.10

## 2013-12-21 DIAGNOSIS — M069 Rheumatoid arthritis, unspecified: Secondary | ICD-10-CM | POA: Diagnosis not present

## 2014-01-08 ENCOUNTER — Inpatient Hospital Stay (HOSPITAL_COMMUNITY)
Admission: EM | Admit: 2014-01-08 | Discharge: 2014-01-10 | DRG: 247 | Disposition: A | Discharge status: Home / Self Care | Payer: Medicare Other | Attending: Internal Medicine | Admitting: Internal Medicine

## 2014-01-08 ENCOUNTER — Encounter (HOSPITAL_COMMUNITY)
Admission: EM | Disposition: A | Discharge status: Home / Self Care | Payer: Medicare Other | Source: Home / Self Care | Attending: Internal Medicine

## 2014-01-08 ENCOUNTER — Inpatient Hospital Stay (HOSPITAL_COMMUNITY): Payer: Medicare Other

## 2014-01-08 ENCOUNTER — Encounter (HOSPITAL_COMMUNITY): Payer: Self-pay | Admitting: Emergency Medicine

## 2014-01-08 DIAGNOSIS — Z7982 Long term (current) use of aspirin: Secondary | ICD-10-CM

## 2014-01-08 DIAGNOSIS — E782 Mixed hyperlipidemia: Secondary | ICD-10-CM | POA: Diagnosis not present

## 2014-01-08 DIAGNOSIS — J309 Allergic rhinitis, unspecified: Secondary | ICD-10-CM

## 2014-01-08 DIAGNOSIS — I2582 Chronic total occlusion of coronary artery: Secondary | ICD-10-CM | POA: Diagnosis present

## 2014-01-08 DIAGNOSIS — Z79899 Other long term (current) drug therapy: Secondary | ICD-10-CM

## 2014-01-08 DIAGNOSIS — K219 Gastro-esophageal reflux disease without esophagitis: Secondary | ICD-10-CM | POA: Diagnosis present

## 2014-01-08 DIAGNOSIS — R5381 Other malaise: Secondary | ICD-10-CM

## 2014-01-08 DIAGNOSIS — Z860100 Personal history of colon polyps, unspecified: Secondary | ICD-10-CM

## 2014-01-08 DIAGNOSIS — I959 Hypotension, unspecified: Secondary | ICD-10-CM | POA: Diagnosis not present

## 2014-01-08 DIAGNOSIS — R7309 Other abnormal glucose: Secondary | ICD-10-CM

## 2014-01-08 DIAGNOSIS — Z87891 Personal history of nicotine dependence: Secondary | ICD-10-CM | POA: Diagnosis not present

## 2014-01-08 DIAGNOSIS — R109 Unspecified abdominal pain: Secondary | ICD-10-CM

## 2014-01-08 DIAGNOSIS — R10A2 Flank pain, left side: Secondary | ICD-10-CM

## 2014-01-08 DIAGNOSIS — IMO0002 Reserved for concepts with insufficient information to code with codable children: Secondary | ICD-10-CM | POA: Diagnosis not present

## 2014-01-08 DIAGNOSIS — R5383 Other fatigue: Secondary | ICD-10-CM

## 2014-01-08 DIAGNOSIS — I2119 ST elevation (STEMI) myocardial infarction involving other coronary artery of inferior wall: Secondary | ICD-10-CM | POA: Diagnosis not present

## 2014-01-08 DIAGNOSIS — R079 Chest pain, unspecified: Secondary | ICD-10-CM | POA: Diagnosis not present

## 2014-01-08 DIAGNOSIS — M06222 Rheumatoid bursitis, left elbow: Secondary | ICD-10-CM

## 2014-01-08 DIAGNOSIS — M1990 Unspecified osteoarthritis, unspecified site: Secondary | ICD-10-CM

## 2014-01-08 DIAGNOSIS — F411 Generalized anxiety disorder: Secondary | ICD-10-CM | POA: Diagnosis present

## 2014-01-08 DIAGNOSIS — I10 Essential (primary) hypertension: Secondary | ICD-10-CM

## 2014-01-08 DIAGNOSIS — R209 Unspecified disturbances of skin sensation: Secondary | ICD-10-CM

## 2014-01-08 DIAGNOSIS — I491 Atrial premature depolarization: Secondary | ICD-10-CM | POA: Diagnosis present

## 2014-01-08 DIAGNOSIS — F4321 Adjustment disorder with depressed mood: Secondary | ICD-10-CM

## 2014-01-08 DIAGNOSIS — M2550 Pain in unspecified joint: Secondary | ICD-10-CM

## 2014-01-08 DIAGNOSIS — I517 Cardiomegaly: Secondary | ICD-10-CM | POA: Diagnosis not present

## 2014-01-08 DIAGNOSIS — M542 Cervicalgia: Secondary | ICD-10-CM

## 2014-01-08 DIAGNOSIS — M545 Low back pain, unspecified: Secondary | ICD-10-CM

## 2014-01-08 DIAGNOSIS — E785 Hyperlipidemia, unspecified: Secondary | ICD-10-CM | POA: Diagnosis present

## 2014-01-08 DIAGNOSIS — IMO0001 Reserved for inherently not codable concepts without codable children: Secondary | ICD-10-CM

## 2014-01-08 DIAGNOSIS — Z888 Allergy status to other drugs, medicaments and biological substances status: Secondary | ICD-10-CM

## 2014-01-08 DIAGNOSIS — Z955 Presence of coronary angioplasty implant and graft: Secondary | ICD-10-CM

## 2014-01-08 DIAGNOSIS — I213 ST elevation (STEMI) myocardial infarction of unspecified site: Secondary | ICD-10-CM

## 2014-01-08 DIAGNOSIS — R072 Precordial pain: Secondary | ICD-10-CM | POA: Diagnosis not present

## 2014-01-08 DIAGNOSIS — G43909 Migraine, unspecified, not intractable, without status migrainosus: Secondary | ICD-10-CM | POA: Diagnosis present

## 2014-01-08 DIAGNOSIS — Z8249 Family history of ischemic heart disease and other diseases of the circulatory system: Secondary | ICD-10-CM

## 2014-01-08 DIAGNOSIS — I219 Acute myocardial infarction, unspecified: Secondary | ICD-10-CM | POA: Diagnosis not present

## 2014-01-08 DIAGNOSIS — I2510 Atherosclerotic heart disease of native coronary artery without angina pectoris: Secondary | ICD-10-CM | POA: Diagnosis present

## 2014-01-08 DIAGNOSIS — M069 Rheumatoid arthritis, unspecified: Secondary | ICD-10-CM

## 2014-01-08 DIAGNOSIS — R002 Palpitations: Secondary | ICD-10-CM

## 2014-01-08 HISTORY — PX: LEFT HEART CATHETERIZATION WITH CORONARY ANGIOGRAM: SHX5451

## 2014-01-08 HISTORY — PX: PERCUTANEOUS CORONARY STENT INTERVENTION (PCI-S): SHX5485

## 2014-01-08 HISTORY — DX: Atherosclerotic heart disease of native coronary artery without angina pectoris: I25.10

## 2014-01-08 LAB — COMPREHENSIVE METABOLIC PANEL
ALT: 22 U/L (ref 0–53)
AST: 22 U/L (ref 0–37)
Albumin: 4.1 g/dL (ref 3.5–5.2)
Alkaline Phosphatase: 69 U/L (ref 39–117)
BUN: 17 mg/dL (ref 6–23)
CO2: 24 mEq/L (ref 19–32)
Calcium: 9.5 mg/dL (ref 8.4–10.5)
Chloride: 102 mEq/L (ref 96–112)
Creatinine, Ser: 0.83 mg/dL (ref 0.50–1.35)
GFR calc Af Amer: >90 mL/min (ref >90)
GFR calc non Af Amer: 82 mL/min — ABNORMAL LOW (ref >90)
Glucose, Bld: 169 mg/dL — ABNORMAL HIGH (ref 70–99)
Potassium: 3.9 mEq/L (ref 3.7–5.3)
Sodium: 141 mEq/L (ref 137–147)
Total Bilirubin: 0.6 mg/dL (ref 0.3–1.2)
Total Protein: 7.9 g/dL (ref 6.0–8.3)

## 2014-01-08 LAB — CBC
HCT: 43.7 % (ref 39.0–52.0)
Hemoglobin: 15.3 g/dL (ref 13.0–17.0)
MCH: 33.6 pg (ref 26.0–34.0)
MCHC: 35 g/dL (ref 30.0–36.0)
MCV: 95.8 fL (ref 78.0–100.0)
Platelets: 278 10*3/uL (ref 150–400)
RBC: 4.56 MIL/uL (ref 4.22–5.81)
RDW: 13.6 % (ref 11.5–15.5)
WBC: 7.4 10*3/uL (ref 4.0–10.5)

## 2014-01-08 LAB — POCT I-STAT, CHEM 8
BUN: 17 mg/dL (ref 6–23)
Calcium, Ion: 1.25 mmol/L (ref 1.13–1.30)
Chloride: 103 mEq/L (ref 96–112)
Creatinine, Ser: 0.8 mg/dL (ref 0.50–1.35)
Glucose, Bld: 201 mg/dL — ABNORMAL HIGH (ref 70–99)
HCT: 44 % (ref 39.0–52.0)
Hemoglobin: 15 g/dL (ref 13.0–17.0)
Potassium: 3.5 mEq/L — ABNORMAL LOW (ref 3.7–5.3)
Sodium: 139 mEq/L (ref 137–147)
TCO2: 23 mmol/L (ref 0–100)

## 2014-01-08 LAB — TROPONIN I: Troponin I: 12.92 ng/mL (ref <0.30)

## 2014-01-08 LAB — APTT: aPTT: 26 seconds (ref 24–37)

## 2014-01-08 LAB — PROTIME-INR
INR: 0.85 (ref 0.00–1.49)
Prothrombin Time: 11.5 seconds — ABNORMAL LOW (ref 11.6–15.2)

## 2014-01-08 LAB — POCT I-STAT TROPONIN I: Troponin i, poc: 0.02 ng/mL (ref 0.00–0.08)

## 2014-01-08 LAB — POCT ACTIVATED CLOTTING TIME: Activated Clotting Time: 581 seconds

## 2014-01-08 LAB — MRSA PCR SCREENING: MRSA by PCR: NEGATIVE

## 2014-01-08 SURGERY — LEFT HEART CATHETERIZATION WITH CORONARY ANGIOGRAM
Anesthesia: LOCAL

## 2014-01-08 MED ORDER — DULOXETINE HCL 60 MG PO CPEP
60.0000 mg | ORAL_CAPSULE | Freq: Every day | ORAL | Status: DC
  Administered 2014-01-09 – 2014-01-10 (×2): 60 mg via ORAL
  Filled 2014-01-08 (×2): qty 1

## 2014-01-08 MED ORDER — MORPHINE SULFATE 2 MG/ML IJ SOLN
INTRAMUSCULAR | Status: AC
  Filled 2014-01-08: qty 2

## 2014-01-08 MED ORDER — ATORVASTATIN CALCIUM 10 MG PO TABS
10.0000 mg | ORAL_TABLET | Freq: Every day | ORAL | Status: DC
  Administered 2014-01-08 – 2014-01-09 (×2): 10 mg via ORAL
  Filled 2014-01-08 (×3): qty 1

## 2014-01-08 MED ORDER — METOPROLOL TARTRATE 25 MG PO TABS
25.0000 mg | ORAL_TABLET | Freq: Two times a day (BID) | ORAL | Status: DC
  Administered 2014-01-08 – 2014-01-10 (×4): 25 mg via ORAL
  Filled 2014-01-08 (×5): qty 1

## 2014-01-08 MED ORDER — HEPARIN (PORCINE) IN NACL 2-0.9 UNIT/ML-% IJ SOLN
INTRAMUSCULAR | Status: AC
  Filled 2014-01-08: qty 1000

## 2014-01-08 MED ORDER — ASPIRIN 81 MG PO CHEW
324.0000 mg | CHEWABLE_TABLET | Freq: Once | ORAL | Status: AC
  Administered 2014-01-08: 324 mg via ORAL
  Filled 2014-01-08: qty 4

## 2014-01-08 MED ORDER — SODIUM CHLORIDE 0.9 % IV SOLN
INTRAVENOUS | Status: DC
  Administered 2014-01-09: via INTRAVENOUS

## 2014-01-08 MED ORDER — SODIUM CHLORIDE 0.9 % IV SOLN
1.7500 mg/kg/h | INTRAVENOUS | Status: AC
  Filled 2014-01-08 (×3): qty 250

## 2014-01-08 MED ORDER — VITAMIN B-12 1000 MCG PO TABS
2500.0000 ug | ORAL_TABLET | Freq: Every day | ORAL | Status: DC
  Administered 2014-01-09 – 2014-01-10 (×2): 2500 ug via ORAL
  Filled 2014-01-08 (×2): qty 1

## 2014-01-08 MED ORDER — NITROGLYCERIN 0.2 MG/ML ON CALL CATH LAB
INTRAVENOUS | Status: AC
  Filled 2014-01-08: qty 1

## 2014-01-08 MED ORDER — PREDNISONE 5 MG PO TABS
5.0000 mg | ORAL_TABLET | Freq: Every day | ORAL | Status: DC
Start: 2014-01-09 — End: 2014-01-10
  Administered 2014-01-09 – 2014-01-10 (×2): 5 mg via ORAL
  Filled 2014-01-08 (×3): qty 1

## 2014-01-08 MED ORDER — VITAMIN D3 25 MCG (1000 UNIT) PO TABS
1000.0000 [IU] | ORAL_TABLET | Freq: Every day | ORAL | Status: DC
  Administered 2014-01-09 – 2014-01-10 (×2): 1000 [IU] via ORAL
  Filled 2014-01-08 (×2): qty 1

## 2014-01-08 MED ORDER — NITROGLYCERIN 0.4 MG SL SUBL: 0.4000 mg | SUBLINGUAL_TABLET | SUBLINGUAL | Status: DC | PRN

## 2014-01-08 MED ORDER — MIDAZOLAM HCL 2 MG/2ML IJ SOLN
INTRAMUSCULAR | Status: AC
Start: 2014-01-08 — End: 2014-01-08
  Filled 2014-01-08: qty 2

## 2014-01-08 MED ORDER — ASPIRIN 81 MG PO TABS: 81.0000 mg | ORAL_TABLET | Freq: Every day | ORAL | Status: DC

## 2014-01-08 MED ORDER — DOPAMINE-DEXTROSE 3.2-5 MG/ML-% IV SOLN
INTRAVENOUS | Status: AC
  Filled 2014-01-08: qty 250

## 2014-01-08 MED ORDER — TICAGRELOR 90 MG PO TABS
90.0000 mg | ORAL_TABLET | Freq: Two times a day (BID) | ORAL | Status: DC
  Administered 2014-01-08 – 2014-01-10 (×4): 90 mg via ORAL
  Filled 2014-01-08 (×5): qty 1

## 2014-01-08 MED ORDER — TICAGRELOR 90 MG PO TABS
ORAL_TABLET | ORAL | Status: AC
  Filled 2014-01-08: qty 1

## 2014-01-08 MED ORDER — VITAMIN D 1000 UNITS PO CAPS: 1000.0000 [IU] | ORAL_CAPSULE | Freq: Every day | ORAL | Status: DC

## 2014-01-08 MED ORDER — MIDAZOLAM HCL 2 MG/2ML IJ SOLN
INTRAMUSCULAR | Status: AC
  Filled 2014-01-08: qty 2

## 2014-01-08 MED ORDER — BIVALIRUDIN 250 MG IV SOLR
INTRAVENOUS | Status: AC
  Filled 2014-01-08: qty 250

## 2014-01-08 MED ORDER — LORAZEPAM 0.5 MG PO TABS
0.5000 mg | ORAL_TABLET | Freq: Two times a day (BID) | ORAL | Status: DC | PRN
  Administered 2014-01-09: 0.5 mg via ORAL
  Filled 2014-01-08: qty 1

## 2014-01-08 MED ORDER — ASPIRIN EC 81 MG PO TBEC: 81.0000 mg | DELAYED_RELEASE_TABLET | Freq: Every day | ORAL | Status: DC

## 2014-01-08 MED ORDER — ATROPINE SULFATE 0.1 MG/ML IJ SOLN
INTRAMUSCULAR | Status: AC
  Filled 2014-01-08: qty 10

## 2014-01-08 MED ORDER — HEPARIN SODIUM (PORCINE) 5000 UNIT/ML IJ SOLN
4000.0000 [IU] | INTRAMUSCULAR | Status: AC
  Administered 2014-01-08: 4000 [IU] via INTRAVENOUS
  Filled 2014-01-08: qty 1

## 2014-01-08 MED ORDER — LIDOCAINE HCL (PF) 1 % IJ SOLN
INTRAMUSCULAR | Status: AC
  Filled 2014-01-08: qty 30

## 2014-01-08 MED ORDER — SODIUM CHLORIDE 0.9 % IV SOLN: INTRAVENOUS | Status: DC

## 2014-01-08 MED ORDER — ZOLPIDEM TARTRATE 5 MG PO TABS
5.0000 mg | ORAL_TABLET | Freq: Every evening | ORAL | Status: DC | PRN
  Administered 2014-01-08: 5 mg via ORAL
  Filled 2014-01-08 (×2): qty 1

## 2014-01-08 MED ORDER — CYANOCOBALAMIN 2500 MCG SL SUBL: 1.0000 | SUBLINGUAL_TABLET | Freq: Every day | SUBLINGUAL | Status: DC

## 2014-01-08 MED ORDER — HEPARIN SODIUM (PORCINE) 5000 UNIT/ML IJ SOLN
INTRAMUSCULAR | Status: AC
  Filled 2014-01-08: qty 1

## 2014-01-08 MED ORDER — AMLODIPINE BESYLATE 5 MG PO TABS: 5.0000 mg | ORAL_TABLET | Freq: Every day | ORAL | Status: DC

## 2014-01-08 MED ORDER — TICAGRELOR 90 MG PO TABS
ORAL_TABLET | ORAL | Status: AC
  Administered 2014-01-08: 90 mg via ORAL
  Filled 2014-01-08: qty 1

## 2014-01-08 MED ORDER — ASPIRIN 81 MG PO CHEW
81.0000 mg | CHEWABLE_TABLET | Freq: Every day | ORAL | Status: DC
  Administered 2014-01-09 – 2014-01-10 (×2): 81 mg via ORAL
  Filled 2014-01-08 (×2): qty 1

## 2014-01-08 MED ORDER — NITROGLYCERIN IN D5W 200-5 MCG/ML-% IV SOLN: 2.0000 ug/min | INTRAVENOUS | Status: DC

## 2014-01-08 MED ORDER — METOPROLOL TARTRATE 25 MG PO TABS: 25.0000 mg | ORAL_TABLET | Freq: Two times a day (BID) | ORAL | Status: DC

## 2014-01-08 MED ORDER — FENTANYL CITRATE 0.05 MG/ML IJ SOLN
INTRAMUSCULAR | Status: AC
  Filled 2014-01-08: qty 2

## 2014-01-08 NOTE — ED Notes (Signed)
Pt reports central cp "discomfort" for several weeks that is intermittent. Today at 1215, developed central cp that hasn't eased off with radiation to both arms. No n/v. "I thought it was gas pain". Pt is a x 4. Denies cardiac hx.

## 2014-01-08 NOTE — ED Notes (Signed)
Activated Code Stemi  

## 2014-01-08 NOTE — H&P (Signed)
History and Physical  Patient ID: [name] MRN: [id number], DOB: [birth year] Date of Encounter: 01/08/2014, 1:22 PM Primary Physician: [name], MD Primary Cardiologist: Lief Palmatier  Chief Complaint: CP Reason for Admission: STEMI  HPI: [name] is a 80 y/o M with history of HTN, HL, RA on chronic low-dose prednisone, GERD, PACs (previously seen by Dr. Rayann Heman, normal EF), and negative stress test 2012 who presented to [facility] today with chest pain. In retrospect he's been feeling chest discomfort described like "indigestion" on/off for the past 2 weeks without any particular trigger, resolving spontaneously. Today he sat down to eat lunch around 12:15 and after the first bite, began experiencing substernal chest discomfort with radiation to his left arm. No dizziness, diaphoresis, nausea or SOB. He thought it was gas pain rated 4-5/10.[name] However, when it did not go away, he presented to the ER. He was found to have inferior ST elevation III, avF. Without any intervention, pain began to subside. He received 324mg  ASA and bolus of heparin. He was hypertensive on arrival to the ER 180/110.  Past Medical History  Diagnosis Date  . Colon polyp   . Hyperlipidemia   . Osteoarthritis   . Migraine with aura   . RA (rheumatoid arthritis)   . Premature atrial contractions   . GERD (gastroesophageal reflux disease)   . Hypertension      Most Recent Cardiac Studies: Nuclear stress test 09/2011 Overall Impression: Normal stress nuclear study. Hypertensive response to low level exercise EF 61%  2D echo 2011 - Left ventricle: The cavity size was normal. There was mild concentric hypertrophy. Systolic function was normal. The estimated ejection fraction was in the range of 55% to 60%. Wall motion was normal; there were no regional wall motion abnormalities. Doppler parameters are consistent with abnormal left ventricular relaxation (grade 1 diastolic dysfunction). - Aortic valve:  Mild regurgitation. - Mitral valve: Calcified annulus.    Surgical History:  Past Surgical History  Procedure Laterality Date  . Back surgery  2004     Home Meds: Prior to Admission medications   Medication Sig Start Date End Date Taking? Authorizing Provider  Abatacept (ORENCIA [place]) Inject into the skin every 30 (thirty) days.   Yes Historical Provider, MD  amLODipine (NORVASC) 5 MG tablet Take 1 tablet (5 mg total) by mouth daily. 11/28/13  Yes Thompson Grayer, MD  aspirin 81 MG tablet Take 81 mg by mouth daily.   Yes Historical Provider, MD  Cholecalciferol (VITAMIN D) 1000 UNITS capsule Take 1,000 Units by mouth daily.     Yes Historical Provider, MD  Cyanocobalamin 2500 MCG SUBL Place 1 tablet under the tongue daily.     Yes Historical Provider, MD  DULoxetine (CYMBALTA) 60 MG capsule Take 1 capsule (60 mg total) by mouth daily. 10/02/13  Yes Evie Lacks Plotnikov, MD  LORazepam (ATIVAN) 0.5 MG tablet TAKE ONE OR TWO TABLETS TWICE DAILY AS NEEDED FOR ANXIETY 08/06/13  Yes Evie Lacks Plotnikov, MD  oxyCODONE-acetaminophen (ROXICET) 5-325 MG per tablet Take 1 tablet by mouth every 6 (six) hours as needed. 10/02/13  Yes Evie Lacks Plotnikov, MD  predniSONE (DELTASONE) 5 MG tablet Take 5 mg by mouth daily with breakfast.   Yes Historical Provider, MD  zolpidem (AMBIEN) 10 MG tablet TAKE 1 TABLET AT BEDTIME AS NEEDED FOR SLEEP 06/18/13  Yes Cassandria Anger, MD    Allergies:  Allergies  Allergen Reactions  . Atorvastatin   . Methotrexate     REACTION:  tachycardia  . Pravastatin Sodium     REACTION: cramps, fatigue  . Rosuvastatin     History   Social History  . Marital Status: Married    Spouse Name: N/A    Number of Children: 2  . Years of Education: N/A   Occupational History  . Retired    Social History Main Topics  . Smoking status: Former Research scientist (life sciences)  . Smokeless tobacco: Never Used     Comment: Quit 50 yrs ago (as of 2015)  . Alcohol Use: 4.2 - 8.4 oz/week    7-14 Glasses  of wine per week     Comment: 1-2 glasses of wine nightly  . Drug Use: No  . Sexual Activity: Yes   Other Topics Concern  . Not on file   Social History Narrative   1 Caffeine drink daily      Family History  Problem Relation Age of Onset  . Colon cancer Neg Hx   . Hypertension Mother   . Heart disease Father     Review of Systems: See above. All other systems reviewed and are otherwise negative except as noted above.  Labs:   Lab Results  Component Value Date   WBC 7.4 01/08/2014   HGB 15.3 01/08/2014   HCT 43.7 01/08/2014   MCV 95.8 01/08/2014   PLT 278 01/08/2014   Lab Results  Component Value Date   CHOL 241* 05/24/2013   HDL 43.20 05/24/2013   LDLCALC 113* 11/30/2012   TRIG 180.0* 05/24/2013    Radiology/Studies:  None   EKG: NSR 81bpm inferior ST elevation up to 1.5 mm in lead III, less pronounced avF, ST depression/TWI I, avL, V2  Physical Exam: Blood pressure 182/95, pulse 81, temperature 97.9 F (36.6 C), temperature source Oral, resp. rate 20, height 5\' 11"  (1.803 m), weight 180 lb (81.647 kg), SpO2 99.00%. General: Well developed, well nourished WM in no acute distress, comfortable appearing, pleasantly conversant Head: Normocephalic, atraumatic, sclera non-icteric, no xanthomas, nares are without discharge.  Neck: JVD not elevated. Lungs: Clear bilaterally to auscultation without wheezes, rales, or rhonchi. Breathing is unlabored. Heart: RRR with S1 S2. No murmurs, rubs, or gallops appreciated. Abdomen: Soft, non-tender, non-distended with normoactive bowel sounds. No hepatomegaly. No rebound/guarding. No obvious abdominal masses. Msk:  Strength and tone appear normal for age. Extremities: No clubbing or cyanosis. No edema.  Distal pedal pulses are 2+ and equal bilaterally. Neuro: Alert and oriented X 3. No focal deficit. No facial asymmetry. Moves all extremities spontaneously. Psych:  Responds to questions appropriately with a normal affect.    ASSESSMENT AND PLAN:   1. Acute inferior STEMI / probable CAD 2. HTN, elevated 3. Rheumatoid arthritis, on chronic prednisone 4. Hyperlipidemia, statin intolerant  Plan emergent cardiac catheterization. Continue aspirin. No statin due to intolerance. Will need augmentation of blood pressure control. Continue low dose prednisone at home dose but will monitor for any signs of insufficiency. Check lipids, baseline CXR. Further recs to follow pending cath results (will discuss BB and additional platelet agent with MD pending outcome).  Signed, Dayna Dunn PA-C 01/08/2014, 1:22 PM  I have seen, examined the patient, and reviewed the above assessment and plan.  Changes to above are made where necessary.  Mr [name] is well known to me.  He now presents with progressive symptoms of chest pain and inferior STEMI.  He will be taken emergently to the cath lab.  Risks, benefits, and alternatives to left heart catheterization with possible PCI were discussed at  length with the patient who wishes to proceed.  We will optimize his medicines post cath.  Co Sign: Thompson Grayer, MD 01/08/2014 1:41 PM

## 2014-01-08 NOTE — CV Procedure (Addendum)
[name] is a 80 y.o. male    [id number]  [id number] LOCATION:  FACILITY: [place]  PHYSICIAN: Troy Sine, MD, [facility] 04-10-1933   DATE OF PROCEDURE:  01/08/2014    EMERGENCY CARDIAC CATHETERIZATION / PERCUTANEOUS CORONARY INTERVENTION    HISTORY:  [name] is a 70 -year-old male who has a history of hypertension, hyperlipidemia, rheumatoid arthritis, and PVCs who presented to the hospital today in the setting of an ST segment elevation inferior wall myocardial infarction. For the past 2 weeks, he has experienced "indigestion "discomfort. In the emergency room, ECG revealed inferior ST elevation c/w inferior wall STEMI and he is transported to the cardiac catheterization for acute catheterization and possible coronary intervention.   PROCEDURE:  The patient was brought to the second floor [facility]iac cath lab in the postabsorptive state. Upon arrival to the catheterization laboratory, the patient was still experiencing chest pain 5/10. He had received 4000 units of heparin the emergency room and aspirin. Versed 1 mg and fentanyl 25 mcg were administered for sedation. His right femoral artery was punctured anteriorly and a 6 French sheath was inserted without difficulty. Diagnostic catheterization was done utilizing a 6 [place] Judkins 4 left coronary catheter and a 6 [place] FR4 guiding catheter was inserted into the RCA which revealed a total/subtotal occlusion with TIMI 1/2 flow. Initial blood pressure was notable for significant hypertension at 180/100 and IV nitroglycerin was started. Angiomax bolus plus infusion is administered. The patient was given Brilinta 180 mg orally for antiplatelet therapy. An Asahi medium wire was advanced down the RCA and was able to cross the distal subtotal stenosis/occlusion. A 2.5x12 mm Trek balloon was used for initial dilatation in the distal RCA. The patient developed significant hypotension with a blood pressure of 65-70, his nitroglycerin was  stopped, he was treated with normal saline bolus and also dopamine was administered initially at 4 and then increase to 5 mcg.  Initially there was continued slow flow following initial dilatation. The 2.5 balloon was then exchanged for a 3.0x15 mm TYrek balloon. 3 inflations at 4-9 atmospheres were made. A Xience Alpine 3.0x23 mm DES stent was then inserted into the distal RCA at the site of subtotal/total occlusion and was deployed x2 at 12 and 13 atmospheres. Due to a long area of 70% stenosis in the mid RCA a Xience Alpine 3.5x28 mm DES stent was then inserted into the mid RCA to cover the entire stenosis and was dilated x2at 11 and 13 atmospheres. An [place] Trek 3.5x15 mm was used for post stent dilatation at both stent sites with the distal stent being dilated to approximately 3.25 mm and the mid stent being dilated to approximately 3.60 mm. Scout angiography confirmed an excellent angiographic result. A 6 French pigtail catheter was then inserted and left ventriculography was performed. The arterial sheath was sutured in place with plans to continue full dose Angiomax for 4 hours post procedure. The patient left the catheterization laboratory pain-free and with stable hemodynamics and with normalization of blood pressure remained was weaned and discontinued prior to transfer to the coronary care unit.   HEMODYNAMICS:   Central Aorta: 180/100  Aortic pressure decreased to 66/45  On pullback: Left ventricular pressure 110/8; aortic pressure 110/68    ANGIOGRAPHY:  1. Left main: Very short and immediately bifurcated into an LAD and left circumflex coronary artery 2. LAD: Moderate size vessel that had 60-70% stenosis in the midsegment with midsystolic bridging component 3. Left circumflex: Angiographically normal vessel  which gave rise to 2 major marginal branches.  4. Right coronary artery: Large dominant right coronary artery that had diffuse 70% mid stenosis and then was subtotally/totally  occluded beyond the acute margin the distal RCA with initial TIMI one half flow.  Following percutaneous coronary intervention with PTCA and stenting the 99/100% distal RCA stenosis was reduced to 0% with ultimate insertion of a 3.0x23 mm Xience Alpine stent postdilated to 3.25 mm, and the diffuse 70% mid RCA stenosis was reduced to 0% with the 3.5x28 mm Xience Alpine  Stent postdilated to 3.6 mm. The RCA was a large vessel that gave rise to a large PDA, a large PLA, and several inferolateral branches.   Left ventriculography revealed preserved global the function without apparent wall motion abnormality. Ejection fraction was approximately 55%.  IMPRESSION:  Acute inferior wall ST segment elevation myocardial infarction secondary to total/subtotal RCA occlusion in a large dominant RCA  60 - 70% mid LAD stenosis with mid systolic systolic bridging  Normal left circumflex coronary artery  Diffuse 70% mid RCA stenosis and 99/100% distal RCA stenosis with initial TIMI one half flow  Successful acute intervention to the distal RCA with the subtotal stenosis being reduced to 0% and restoration of TIMI 3 flow with insertion of a 3.0x23 mm Xience Alpine DES stent postdilated to 3.25 mm, and insertion of a 3.5x28 mm Xience Alpine DES stent post dilated to 3.6 mm into the mid RCA with the 70% stenosis reduced to 0%  Door to balloon time: 31 minutes  Angiomax/Brilinta/NTG and transient dopamine    Troy Sine, MD, [facility] 01/08/2014 10:01 PM

## 2014-01-08 NOTE — Progress Notes (Signed)
Utilization Review Completed.Donne Anon T2/24/2015

## 2014-01-08 NOTE — Progress Notes (Signed)
Per d/w Dr. Claiborne Billings, due to hypotension during cath, will hold off patient's home amlodipine. BP has since come up so we anticipate ability to start metoprolol 25mg  BID tonight (order written).  Evan Sultana PA-C

## 2014-01-08 NOTE — ED Provider Notes (Signed)
CSN: [id number]     Arrival date & time 01/08/14  1241 History   First MD Initiated Contact with Patient 01/08/14 1303     Chief Complaint  Patient presents with  . Chest Pain     (Consider location/radiation/quality/duration/timing/severity/associated sxs/prior Treatment) The history is limited by the condition of the patient.  A LEVEL 5 CAVEAT PERTAINS DUE TO URGENT NEED FOR INTERVENTION. Pt presents through triage with chest pain.  He states he had onset of substernal chest pain while eating lunch today.  Pain radiated to both shoulders,  He states he has been having symptoms similar to this for the past several weeks which he attributed to reflux.  He states the episode today lasted longer which prompted visit to the ED.  No shortness of breath, no nausea, no diaphoresis.  Pain now 3/10, was 6/10 at its worst. He has not had any treatment prior to arrival.  Pain is constant.  There are no other associated systemic symptoms, there are no other alleviating or modifying factors.   Past Medical History  Diagnosis Date  . Colon polyp   . Hyperlipidemia   . Osteoarthritis   . Migraine with aura   . RA (rheumatoid arthritis)   . Premature atrial contractions   . GERD (gastroesophageal reflux disease)   . Hypertension    Past Surgical History  Procedure Laterality Date  . Back surgery  2004   Family History  Problem Relation Age of Onset  . Colon cancer Neg Hx   . Hypertension Mother   . Heart disease Father    History  Substance Use Topics  . Smoking status: Former Research scientist (life sciences)  . Smokeless tobacco: Never Used     Comment: Quit 50 yrs ago (as of 2015)  . Alcohol Use: 4.2 - 8.4 oz/week    7-14 Glasses of wine per week     Comment: 1-2 glasses of wine nightly    Review of Systems UNABLE TO OBTAIN ROS DUE TO LEVEL 5 CAVEAT    Allergies  Atorvastatin; Methotrexate; Pravastatin sodium; and Rosuvastatin  Home Medications   No current outpatient prescriptions on file. BP 182/95   Pulse 78  Temp(Src) 97.9 F (36.6 C) (Oral)  Resp 20  Ht 5\' 11"  (1.803 m)  Wt 180 lb (81.647 kg)  BMI 25.12 kg/m2  SpO2 99% Vitals reivewed Physical Exam Physical Examination: General appearance - alert, well appearing, and in no distress Mental status - alert, oriented to person, place, and time Eyes - no conjunctival injection, no scleral icterus Mouth - mucous membranes moist, pharynx normal without lesions Neck - supple, no significant adenopathy Chest - clear to auscultation, no wheezes, rales or rhonchi, symmetric air entry Heart - normal rate, regular rhythm, normal S1, S2, no murmurs, rubs, clicks or gallops Abdomen - soft, nontender, nondistended, no masses or organomegaly Extremities - peripheral pulses normal, no pedal edema, no clubbing or cyanosis Skin - normal coloration and turgor  ED Course  Procedures (including critical care time)  CRITICAL CARE Performed by: Threasa Beards Total critical care time: 35 Critical care time was exclusive of separately billable procedures and treating other patients. Critical care was necessary to treat or prevent imminent or life-threatening deterioration. Critical care was time spent personally by me on the following activities: development of treatment plan with patient and/or surrogate as well as nursing, discussions with consultants, evaluation of patient's response to treatment, examination of patient, obtaining history from patient or surrogate, ordering and performing treatments and interventions, ordering and  review of laboratory studies, ordering and review of radiographic studies, pulse oximetry and re-evaluation of patient's condition. Labs Review Labs Reviewed  COMPREHENSIVE METABOLIC PANEL - Abnormal; Notable for the following:    Glucose, Bld 169 (*)    GFR calc non Af Amer 82 (*)    All other components within normal limits  PROTIME-INR - Abnormal; Notable for the following:    Prothrombin Time 11.5 (*)    All  other components within normal limits  APTT  CBC  I-STAT TROPOININ, ED  POCT I-STAT TROPONIN I   Imaging Review No results found.    Date/Time:  Tuesday January 08 2014 12:44:06 EST Ventricular Rate:  81 PR Interval:  180 QRS Duration: 84 QT Interval:  382 QTC Calculation: 443 R Axis:   33 Text Interpretation: Normal sinus rhythm, ST elevations in inferior leads, ACUTE MI       MDM   Final diagnoses:  Hypertension  Rheumatoid arthritis(714.0)  ST elevation myocardial infarction (STEMI) of inferior wall   Pt presenting with chest pain, acute MI on EKG with ST elevations in inferior leads.  Pt placed on monitor, IV access started, aspirin given. Heparin ordered.  Will give morphine instead of nitro given inferior MI- however blood pressure is good.  Cath lab activiated and patient taken emergently to the cath lab.     Threasa Beards, MD 01/11/14 [phone]

## 2014-01-08 NOTE — ED Notes (Signed)
Pt transported to Cath lab with Primary RN Magda Paganini and cariology. Pt on zoll pads and monitor. AO x4. NAD.

## 2014-01-08 NOTE — Progress Notes (Signed)
ANTICOAGULATION CONSULT NOTE - Initial Consult  Pharmacy Consult for Angiomax Indication: S/P PCI  Allergies  Allergen Reactions  . Atorvastatin   . Methotrexate     REACTION: tachycardia  . Pravastatin Sodium     REACTION: cramps, fatigue  . Rosuvastatin     Patient Measurements: Height: 6' (182.9 cm) Weight: 183 lb 3.2 oz (83.1 kg) IBW/kg (Calculated) : 77.6 Heparin Dosing Weight:   Vital Signs: Temp: 97.4 F (36.3 C) (02/24 1557) Temp src: Oral (02/24 1557) BP: 189/99 mmHg (02/24 1600) Pulse Rate: 71 (02/24 1600)  Labs:  Recent Labs  01/08/14 1304  HGB 15.3  HCT 43.7  PLT 278  APTT 26  LABPROT 11.5*  INR 0.85  CREATININE 0.83    Estimated Creatinine Clearance: 80.5 ml/min (by C-G formula based on Cr of 0.83).   Medical History: Past Medical History  Diagnosis Date  . Colon polyp   . Hyperlipidemia   . Osteoarthritis   . Migraine with aura   . RA (rheumatoid arthritis)   . Premature atrial contractions   . GERD (gastroesophageal reflux disease)   . Hypertension     Assessment: 80yom s/p PCI to continue Angiomax for 4 hours post-cath. MD Claiborne Billings has requested full dose Angiomax during post-procedure time - Angiomax 1.75mg /kg/hr restarted ~1515 per RN (confirmed rate).  - CrCl 81 ml/min - CBC wnl this AM - No significant bleeding reported, cath site stable per RN   Plan:  1. Continue Angiomax 1.75 mg/kg/hr until 1915 tonight per MD orders 2. Monitor for s/sx of bleeding  Earleen Newport [phone] 01/08/2014,4:30 PM

## 2014-01-08 NOTE — ED Notes (Signed)
Pt presents with intermittent mid-sternum chest pain x2 weeks, pain/pressure became constant and persistent today radiating down bilateral arms, associated with some nausea, states it worsened at lunch and was unable to eat his soup, states "I don't think I was nauseated, I just couldn't make myself eat the soup." Pt states he thought his pain the last 2 weeks was gas pain due to his symptoms being so vague and has been treating it as if it's indigestion. Pt alert and oriented x4, skin warm and dry

## 2014-01-09 DIAGNOSIS — I959 Hypotension, unspecified: Secondary | ICD-10-CM | POA: Diagnosis not present

## 2014-01-09 DIAGNOSIS — E782 Mixed hyperlipidemia: Secondary | ICD-10-CM

## 2014-01-09 DIAGNOSIS — I517 Cardiomegaly: Secondary | ICD-10-CM

## 2014-01-09 DIAGNOSIS — I2582 Chronic total occlusion of coronary artery: Secondary | ICD-10-CM | POA: Diagnosis not present

## 2014-01-09 DIAGNOSIS — M069 Rheumatoid arthritis, unspecified: Secondary | ICD-10-CM | POA: Diagnosis not present

## 2014-01-09 DIAGNOSIS — I2510 Atherosclerotic heart disease of native coronary artery without angina pectoris: Secondary | ICD-10-CM | POA: Diagnosis not present

## 2014-01-09 DIAGNOSIS — I2119 ST elevation (STEMI) myocardial infarction involving other coronary artery of inferior wall: Secondary | ICD-10-CM | POA: Diagnosis not present

## 2014-01-09 DIAGNOSIS — I10 Essential (primary) hypertension: Secondary | ICD-10-CM | POA: Diagnosis not present

## 2014-01-09 LAB — BASIC METABOLIC PANEL
BUN: 11 mg/dL (ref 6–23)
CO2: 25 mEq/L (ref 19–32)
Calcium: 8.7 mg/dL (ref 8.4–10.5)
Chloride: 106 mEq/L (ref 96–112)
Creatinine, Ser: 0.73 mg/dL (ref 0.50–1.35)
GFR calc Af Amer: >90 mL/min (ref >90)
GFR calc non Af Amer: 87 mL/min — ABNORMAL LOW (ref >90)
Glucose, Bld: 127 mg/dL — ABNORMAL HIGH (ref 70–99)
Potassium: 3.9 mEq/L (ref 3.7–5.3)
Sodium: 143 mEq/L (ref 137–147)

## 2014-01-09 LAB — CBC
HCT: 35.7 % — ABNORMAL LOW (ref 39.0–52.0)
Hemoglobin: 12.5 g/dL — ABNORMAL LOW (ref 13.0–17.0)
MCH: 33.4 pg (ref 26.0–34.0)
MCHC: 35 g/dL (ref 30.0–36.0)
MCV: 95.5 fL (ref 78.0–100.0)
Platelets: 240 10*3/uL (ref 150–400)
RBC: 3.74 MIL/uL — ABNORMAL LOW (ref 4.22–5.81)
RDW: 13.5 % (ref 11.5–15.5)
WBC: 6.6 10*3/uL (ref 4.0–10.5)

## 2014-01-09 LAB — LIPID PANEL
Cholesterol: 236 mg/dL — ABNORMAL HIGH (ref 0–200)
HDL: 50 mg/dL (ref >39)
LDL Cholesterol: 157 mg/dL — ABNORMAL HIGH (ref 0–99)
Total CHOL/HDL Ratio: 4.7 RATIO
Triglycerides: 144 mg/dL (ref <150)
VLDL: 29 mg/dL (ref 0–40)

## 2014-01-09 LAB — HEMOGLOBIN A1C
Hgb A1c MFr Bld: 6.2 % — ABNORMAL HIGH (ref <5.7)
Mean Plasma Glucose: 131 mg/dL — ABNORMAL HIGH (ref <117)

## 2014-01-09 LAB — TROPONIN I
Troponin I: 17.83 ng/mL
Troponin I: 8.79 ng/mL

## 2014-01-09 LAB — TSH: TSH: 0.737 u[IU]/mL (ref 0.350–4.500)

## 2014-01-09 MED ORDER — ACETAMINOPHEN 325 MG PO TABS
650.0000 mg | ORAL_TABLET | ORAL | Status: DC | PRN
  Administered 2014-01-09 (×2): 650 mg via ORAL
  Filled 2014-01-09 (×2): qty 2

## 2014-01-09 MED FILL — Sodium Chloride IV Soln 0.9%: INTRAVENOUS | Qty: 50 | Status: AC

## 2014-01-09 NOTE — Progress Notes (Signed)
Sheath Pull Removal Note Prior to pull pt VSS, rt groin level 0. Manual Pressure held x20 mins, Rt groin level 0, VSS throughout pull. Pressure dressing applied. Post pull instructions given. Pt RN will continue to monitor closely.

## 2014-01-09 NOTE — Progress Notes (Signed)
Subjective: [name] is a 80 yo man pmh HTN, HLD, RA p/w STEMI and LHC 01/08/14.   LHC 01/08/14: total RCA occlusion s/p 3.5x28mm Xience DES distal RCA and 3.5x28 mm DES mid RCA, 60-70% mid LAD stenosis, EF 55%.   Pt feeling wonderful this AM. No CP/SOB. Tele overnight NSR.   Objective: Vital signs in last 24 hours: Filed Vitals:   01/09/14 0425 01/09/14 0500 01/09/14 0600 01/09/14 0700  BP:  109/70 139/80 139/86  Pulse:  59 55 65  Temp:    97.8 F (36.6 C)  TempSrc:    Oral  Resp:  14 14 11   Height:      Weight: 183 lb 3.2 oz (83.1 kg)     SpO2:  96% 96% 94%   Weight change:   Intake/Output Summary (Last 24 hours) at 01/09/14 0728 Last data filed at 01/09/14 0700  Gross per 24 hour  Intake 2286.95 ml  Output   1900 ml  Net 386.95 ml   General: sitting in chair, eating breakfast HEENT: PERRL, EOMI, no scleral icterus Cardiac: RRR, no rubs, murmurs or gallops Pulm: clear to auscultation bilaterally, moving normal volumes of air Abd: soft, nontender, nondistended, BS present Ext: warm and well perfused, no pedal edema, right groin site w/o bruit or active bleeding Neuro: alert and oriented X3, cranial nerves II-XII grossly intact  Lab Results: Basic Metabolic Panel:  Recent Labs Lab 01/08/14 1304 01/08/14 1330 01/09/14 0110  NA 141 139 143  K 3.9 3.5* 3.9  CL 102 103 106  CO2 24  --  25  GLUCOSE 169* 201* 127*  BUN 17 17 11   CREATININE 0.83 0.80 0.73  CALCIUM 9.5  --  8.7   Liver Function Tests:  Recent Labs Lab 01/08/14 1304  AST 22  ALT 22  ALKPHOS 69  BILITOT 0.6  PROT 7.9  ALBUMIN 4.1   CBC:  Recent Labs Lab 01/08/14 1304 01/08/14 1330 01/09/14 0110  WBC 7.4  --  6.6  HGB 15.3 15.0 12.5*  HCT 43.7 44.0 35.7*  MCV 95.8  --  95.5  PLT 278  --  240   Cardiac Enzymes:  Recent Labs Lab 01/08/14 1937 01/09/14 0110  TROPONINI 12.92* 17.83*    Hemoglobin A1C:  Recent Labs Lab 01/08/14 1937  HGBA1C 6.2*   Fasting Lipid  Panel:  Recent Labs Lab 01/09/14 0110  CHOL 236*  HDL 50  LDLCALC 157*  TRIG 144  CHOLHDL 4.7   Thyroid Function Tests:  Recent Labs Lab 01/08/14 1937  TSH 0.737   Coagulation:  Recent Labs Lab 01/08/14 1304  LABPROT 11.5*  INR 0.85   Cardiac Studies: LHC 01/08/14: 1. Left main: Very short and immediately bifurcated into an LAD and left circumflex coronary artery  2. LAD: Moderate size vessel that had 60-70% stenosis in the midsegment with midsystolic bridging component  3. Left circumflex: Angiographically normal vessel which gave rise to 2 major marginal branches.  4. Right coronary artery: Large dominant right coronary artery that had diffuse 70% mid stenosis and then was subtotally/totally occluded beyond the acute margin the distal RCA with initial TIMI one half flow.  Following percutaneous coronary intervention with PTCA and stenting the 99/100% distal RCA stenosis was reduced to 0% with ultimate insertion of a 3.0x23 mm Xience Alpine stent postdilated to 3.25 mm, and the diffuse 70% mid RCA stenosis was reduced to 0% with the 3.5x28 mm Xience Alpine Stent postdilated to 3.6 mm. The RCA was a large vessel  that gave rise to a large PDA, a large PLA, and several inferolateral branches.  Left ventriculography revealed preserved global the function without apparent wall motion abnormality. Ejection fraction was approximately 55%.  11/12 Myoview: normal, EF 61%  Micro Results: Recent Results (from the past 240 hour(s))  MRSA PCR SCREENING     Status: None   Collection Time    01/08/14  3:52 PM      Result Value Ref Range Status   MRSA by PCR NEGATIVE  NEGATIVE Final   Comment:            The GeneXpert MRSA Assay (FDA     approved for NASAL specimens     only), is one component of a     comprehensive MRSA colonization     surveillance program. It is not     intended to diagnose MRSA     infection nor to guide or     monitor treatment for     MRSA infections.    Studies/Results: Portable Chest X-ray 1 View  01/08/2014   CLINICAL DATA:  80 year old male with chest pain. Post cardiac catheterization for STEMI.  EXAM: PORTABLE CHEST - 1 VIEW  COMPARISON:  08/31/2011 prior chest radiographs dating back to 09/25/2009  FINDINGS: Mild cardiomegaly noted.  There is no evidence of focal airspace disease, pulmonary edema, suspicious pulmonary nodule/mass, pleural effusion, or pneumothorax. No acute bony abnormalities are identified.  IMPRESSION: Mild cardiomegaly without acute cardiopulmonary disease.   Electronically Signed   By: Hassan Rowan M.D.   On: 01/08/2014 19:10   Medications: I have reviewed the patient's current medications. Scheduled Meds: . aspirin  81 mg Oral Daily  . atorvastatin  10 mg Oral q1800  . cholecalciferol  1,000 Units Oral Daily  . vitamin B-12  2,500 mcg Oral Daily  . DULoxetine  60 mg Oral Daily  . metoprolol tartrate  25 mg Oral BID  . predniSONE  5 mg Oral Q breakfast  . Ticagrelor  90 mg Oral BID   Continuous Infusions: . sodium chloride Stopped (01/09/14 0645)  . nitroGLYCERIN 15 mcg/min (01/09/14 0320)   PRN Meds:.acetaminophen, LORazepam, nitroGLYCERIN, zolpidem Assessment/Plan: [name] 80 yo man pmh HTN, HLD, RA p/w STEMI and LHC 01/08/14.   #STEMI s/p PCI to RCA: pt had DES to mid and distal RCA.  -cont ASA, brilinta, metoprolol, and statin (may consider crestor at d/c given intolerance 2/2 leg cramps in the past) -cardiac rehab -echo pending -anticipate possible d/c on 01/10/14  #HTN:  -weaned off NTG  #HLD: LDL 157 will increase statin  #RA: pt on prednisone   LOS: 1 day   Clinton Gallant, MD 01/09/2014, 7:28 AM   I have seen, examined the patient, and reviewed the above assessment and plan.  Changes to above are made where necessary.  The patient continues to improve post PCI.  BP is much better this am. CP is resolved. Will transfer to telemetry.  Echo pending Continue ASA, brillinta, metoprolol,  Add acei  inhibitor as bp allows He has not well tolerated lipitor due to muscle cramps previously.  Would try crestor on discharge with follow-up in the lipid clinic.  Needs transition of care follow-up.  Cardiac rehab to see.  Co Sign: Thompson Grayer, MD 01/09/2014 10:07 AM

## 2014-01-09 NOTE — Progress Notes (Signed)
  Echocardiogram 2D Echocardiogram has been performed.  Diamond Nickel 01/09/2014, 11:44 AM

## 2014-01-09 NOTE — Progress Notes (Signed)
CARDIAC REHAB PHASE I   PRE:  Rate/Rhythm: 75SR  BP:  Supine:   Sitting: 140/80  Standing:    SaO2:   MODE:  Ambulation: 300 ft   POST:  Rate/Rhythm: 81  BP:  Supine:   Sitting: 150/90  Standing:    SaO2: 97%RA 1400-1510 Pt walked 300 ft with fairly steady gait. Tires easily. To sitting on side of bed after walk. Education completed with pt re MI restrictions, risk factors, exercise, stent/brilinta,NTG use. CRP 2. Pt voiced understanding. Discussed CRP 2 and pt gave permission to refer to [place] program. Pt has stent card and brilinta packet.    Graylon Good, RN BSN  01/09/2014 3:08 PM

## 2014-01-09 NOTE — Progress Notes (Signed)
Report was called to RN on unit [facility]. Pt will tx to room [facility] 04. Message was left for patients wife.

## 2014-01-09 NOTE — Care Management Note (Signed)
    Page 1 of 1   01/09/2014     10:28:16 AM   CARE MANAGEMENT NOTE 01/09/2014  Patient:  [name], [name]   Account Number:  [id number]  Date Initiated:  01/09/2014  Documentation initiated by:  Elissa Hefty  Subjective/Objective Assessment:   adm w mi     Action/Plan:   lives w wife, has medicare d for meds, pcp dr [name]   Anticipated DC Date:     Anticipated DC Plan:  [place]  CM consult  Medication Assistance      Choice offered to / List presented to:             Status of service:   Medicare Important Message given?   (If response is "NO", the following Medicare IM given date fields will be blank) Date Medicare IM given:   Date Additional Medicare IM given:    Discharge Disposition:  HOME/SELF CARE  Per UR Regulation:  Reviewed for med. necessity/level of care/duration of stay  If discussed at [place] of Stay Meetings, dates discussed:    Comments:  2/25 1027a Evan Jermarion Poffenberger rn,bsn gave pt brilinta 30day free card.

## 2014-01-10 ENCOUNTER — Encounter (HOSPITAL_COMMUNITY): Payer: Self-pay | Admitting: Cardiology

## 2014-01-10 DIAGNOSIS — I2119 ST elevation (STEMI) myocardial infarction involving other coronary artery of inferior wall: Secondary | ICD-10-CM | POA: Diagnosis not present

## 2014-01-10 DIAGNOSIS — I10 Essential (primary) hypertension: Secondary | ICD-10-CM | POA: Diagnosis not present

## 2014-01-10 DIAGNOSIS — I219 Acute myocardial infarction, unspecified: Secondary | ICD-10-CM | POA: Diagnosis not present

## 2014-01-10 DIAGNOSIS — I2582 Chronic total occlusion of coronary artery: Secondary | ICD-10-CM | POA: Diagnosis not present

## 2014-01-10 DIAGNOSIS — I959 Hypotension, unspecified: Secondary | ICD-10-CM | POA: Diagnosis not present

## 2014-01-10 DIAGNOSIS — I2510 Atherosclerotic heart disease of native coronary artery without angina pectoris: Secondary | ICD-10-CM | POA: Diagnosis not present

## 2014-01-10 DIAGNOSIS — E782 Mixed hyperlipidemia: Secondary | ICD-10-CM | POA: Diagnosis not present

## 2014-01-10 MED ORDER — METOPROLOL TARTRATE 25 MG PO TABS: 25.0000 mg | ORAL_TABLET | Freq: Two times a day (BID) | ORAL | Status: DC

## 2014-01-10 MED ORDER — ROSUVASTATIN CALCIUM 10 MG PO TABS: 10.0000 mg | ORAL_TABLET | Freq: Every day | ORAL | Status: DC

## 2014-01-10 MED ORDER — TICAGRELOR 90 MG PO TABS: 90.0000 mg | ORAL_TABLET | Freq: Two times a day (BID) | ORAL | Status: DC

## 2014-01-10 MED ORDER — NITROGLYCERIN 0.4 MG SL SUBL: 0.4000 mg | SUBLINGUAL_TABLET | SUBLINGUAL | Status: DC | PRN

## 2014-01-10 MED ORDER — LISINOPRIL 2.5 MG PO TABS: 2.5000 mg | ORAL_TABLET | Freq: Every day | ORAL | Status: DC

## 2014-01-10 MED ORDER — LISINOPRIL 2.5 MG PO TABS
2.5000 mg | ORAL_TABLET | Freq: Every day | ORAL | Status: DC
  Administered 2014-01-10: 2.5 mg via ORAL
  Filled 2014-01-10: qty 1

## 2014-01-10 NOTE — Discharge Instructions (Signed)
No driving for 3 days. No lifting over 5 lbs for 1 week. No sexual activity for 1 week. Keep procedure site clean & dry. If you notice increased pain, swelling, bleeding or pus, call/return! You may shower, but no soaking baths/hot tubs/pools for 1 week. ° °

## 2014-01-10 NOTE — Progress Notes (Addendum)
Subjective:  Feels well; no chest pain  Objective:   Vital Signs in the last 24 hours: Temp:  [97.2 F (36.2 C)-97.9 F (36.6 C)] 97.9 F (36.6 C) (02/26 0459) Pulse Rate:  [70-76] 70 (02/26 0459) Resp:  [16-20] 20 (02/26 0459) BP: (136-170)/(89-96) 136/89 mmHg (02/26 0459) SpO2:  [98 %-99 %] 98 % (02/26 0459) Weight:  [187 lb 8 oz (85.049 kg)] 187 lb 8 oz (85.049 kg) (02/26 0459)  Intake/Output from previous day: 02/25 0701 - 02/26 0700 In: 602.8 [P.O.:600; I.V.:2.8] Out: -   Medications: . aspirin  81 mg Oral Daily  . atorvastatin  10 mg Oral q1800  . cholecalciferol  1,000 Units Oral Daily  . vitamin B-12  2,500 mcg Oral Daily  . DULoxetine  60 mg Oral Daily  . metoprolol tartrate  25 mg Oral BID  . predniSONE  5 mg Oral Q breakfast  . Ticagrelor  90 mg Oral BID    . sodium chloride Stopped (01/09/14 0645)    Physical Exam:   General appearance: alert, cooperative and no distress Neck: no carotid bruit, no JVD, supple, symmetrical, trachea midline and thyroid not enlarged, symmetric, no tenderness/mass/nodules Lungs: clear to auscultation bilaterally Heart: regular rate and rhythm and 1/6 sem Abdomen: soft, non-tender; bowel sounds normal; no masses,  no organomegaly Extremities: no edema, redness or tenderness in the calves or thighs Pulses: 2+ and symmetric Skin: Skin color, texture, turgor normal. No rashes or lesions Neurologic: Grossly normal   Rate: 78  Rhythm: normal sinus rhythm  Lab Results:    Recent Labs  01/08/14 1304 01/08/14 1330 01/09/14 0110  NA 141 139 143  K 3.9 3.5* 3.9  CL 102 103 106  CO2 24  --  25  GLUCOSE 169* 201* 127*  BUN 17 17 11   CREATININE 0.83 0.80 0.73    Recent Labs  01/09/14 0110 01/09/14 0715  TROPONINI 17.83* 8.79*   Hepatic Function Panel  Recent Labs  01/08/14 1304  PROT 7.9  ALBUMIN 4.1  AST 22  ALT 22  ALKPHOS 69  BILITOT 0.6    Recent Labs  01/08/14 1304  INR 0.85   BNP (last 3  results) No results found for this basename: PROBNP,  in the last 8760 hours  Lipid Panel     Component Value Date/Time   CHOL 236* 01/09/2014 0110   TRIG 144 01/09/2014 0110   HDL 50 01/09/2014 0110   CHOLHDL 4.7 01/09/2014 0110   VLDL 29 01/09/2014 0110   LDLCALC 157* 01/09/2014 0110      Imaging:  Portable Chest X-ray 1 View  01/08/2014   CLINICAL DATA:  79 year old male with chest pain. Post cardiac catheterization for STEMI.  EXAM: PORTABLE CHEST - 1 VIEW  COMPARISON:  08/31/2011 prior chest radiographs dating back to 09/25/2009  FINDINGS: Mild cardiomegaly noted.  There is no evidence of focal airspace disease, pulmonary edema, suspicious pulmonary nodule/mass, pleural effusion, or pneumothorax. No acute bony abnormalities are identified.  IMPRESSION: Mild cardiomegaly without acute cardiopulmonary disease.   Electronically Signed   By: Hassan Rowan M.D.   On: 01/08/2014 19:10      Assessment/Plan:   Active Problems:   STEMI (ST elevation myocardial infarction)  Day 2 s/p inferior wall STEMI; Rx with PCI to RCA with stents to mid and distal RCA. DBT 31 min. EF 55-60% without wall motion abnormalities reflective of myocardial salvage. Trop peak 17.83.  He has 60 - 70%  Mid LAD stenosis with mid  systolic bridging. ECG today shows stable rhythm  NSR at 63 with Q waves 3 and avF. BP today is stable; add low dose ACE-I post MI with lisinopril initially at 2.5 mg and titrate as outpatient. Currently on BB. Pt has tolerated initiation of low dose statin but may consider changing to crestor 10 mg as outpatient since may have had issues with lipitor in past (myalgias). Probable ok to dc later today after further ambulation. Cardiac Rehab.    Troy Sine, MD, [facility] 01/10/2014, 9:05 AM

## 2014-01-10 NOTE — Progress Notes (Signed)
Doing very well today.  I agree with Dr Claiborne Billings that the patient is ready to discharge.   Continue ASA, brillinta, metoprolol, Add lisinopril. I agree with crestor.  The patient and I would like for him to be scheduled to follow-up with [facility] in the lipid clinic. Needs transition of care follow-up with a PA/NP as well as outpatient cardiac rehab.  Follow-up with me in the office in 4 weeks.

## 2014-01-10 NOTE — Progress Notes (Signed)
Pt discharged to home per MD order. Pt received and reviewed all discharge instructions and medication information including follow-up appointments and prescription information. Pt verbalized understanding. Pt alert and oriented at discharge with no complaints of pain. Pt IV and telemetry box removed prior to discharge. Pt ambulated to private vehicle per pt request. Lenna Sciara

## 2014-01-10 NOTE — Progress Notes (Signed)
CARDIAC REHAB PHASE I   PRE:  Rate/Rhythm: 85 SR  BP:  Sitting: 118/80     SaO2:   MODE:  Ambulation: 550 ft   POST:  Rate/Rhythm: 94  BP:  Sitting: 124/82    SaO2: 97 RA  Pt walked 550 ft independently with no assist.  Pt had no c/o, CP or SOB.  Pt stated he walks regularly in the halls.  Pt stated he did not have any questions at this time.  Discussed CRP II a bit more with pt. [phone]  Lillia Dallas MS, ACSM RCEP 9:48 AM 01/10/2014

## 2014-01-10 NOTE — Discharge Summary (Signed)
CARDIOLOGY DISCHARGE SUMMARY    Patient ID: [name],  MRN: [id number], DOB/AGE: 78-Oct-1934 78 y.o.  Admit date: 01/08/2014 Discharge date: 01/10/2014  Primary Care Physician: [name], MD Primary Cardiologist: Thompson Grayer, MD  Primary Discharge Diagnosis:  1. Acute inferior STEMI s/p PCI with DES to mid and distal RCA  Secondary Discharge Diagnoses:  1. HTN 2. Dyslipidemia 3. Rheumatoid arthritis 4. PACs 5. GERD  Procedures This Admission:   1. Cardiac catheterization 01/08/2014 HEMODYNAMICS:  Central Aorta: 180/100  Aortic pressure decreased to 66/45  On pullback: Left ventricular pressure 110/8; aortic pressure 110/68  ANGIOGRAPHY:  1. Left main: Very short and immediately bifurcated into an LAD and left circumflex coronary artery  2. LAD: Moderate size vessel that had 60-70% stenosis in the midsegment with midsystolic bridging component  3. Left circumflex: Angiographically normal vessel which gave rise to 2 major marginal branches.  4. Right coronary artery: Large dominant right coronary artery that had diffuse 70% mid stenosis and then was subtotally/totally occluded beyond the acute margin the distal RCA with initial TIMI one half flow.  Following percutaneous coronary intervention with PTCA and stenting the 99/100% distal RCA stenosis was reduced to 0% with ultimate insertion of a 3.0x23 mm Xience Alpine stent postdilated to 3.25 mm, and the diffuse 70% mid RCA stenosis was reduced to 0% with the 3.5x28 mm Xience Alpine Stent postdilated to 3.6 mm. The RCA was a large vessel that gave rise to a large PDA, a large PLA, and several inferolateral branches.  Left ventriculography revealed preserved global the function without apparent wall motion abnormality. Ejection fraction was approximately 55%. IMPRESSION:  Acute inferior wall ST segment elevation myocardial infarction secondary to total/subtotal RCA occlusion in a large dominant RCA  60 - 70% mid LAD  stenosis with mid systolic systolic bridging  Normal left circumflex coronary artery  Diffuse 70% mid RCA stenosis and 99/100% distal RCA stenosis with initial TIMI one half flow  Successful acute intervention to the distal RCA with the subtotal stenosis being reduced to 0% and restoration of TIMI 3 flow with insertion of a 3.0x23 mm Xience Alpine DES stent postdilated to 3.25 mm, and insertion of a 3.5x28 mm Xience Alpine DES stent post dilated to 3.6 mm into the mid RCA with the 70% stenosis reduced to 0%  Door to balloon time: 31 minutes   2. 2D echo 01/09/2014 Study Conclusions Left ventricle: The cavity size was normal. Wall thickness was increased in a pattern of mild LVH. There was focal basal hypertrophy. Systolic function was normal. The estimated ejection fraction was in the range of 55% to 60%. Wall motion was normal; there were no regional wall motion abnormalities.   History and Hospital Course:  [name] is a 80 y/o gentleman with HTN, dyslipidemia, RA on chronic low-dose prednisone, GERD, PACs and negative stress test 2012 who presented to [facility] on 01/08/2014 with chest pain. 12-lead ECG showed ST elevation inferiorly. He was taken emergently to the cardiac catheterization lab where he was found to have obstructive RCA disease which was successfully treated with DES x 2 to mid- and distal RCA. Please see details as outlined above. Peak troponin 17.83. He was started on Brilinta, metoprolol, lisinopril and statin. Aspirin was continued. He has been monitored on telemetry for 48 hours. No arrhythmias. His groin site is intact without hematoma. He remains hemodynamically stable. He is ambulating without difficulty. He has been seen, examined and deemed stable for discharge today by both  Dr. Rayann Heman and Dr. Claiborne Billings. He will follow-up in the office with [name], PA-C in one week. He will also be scheduled for follow-up with Alferd Apa, PharmD in the [facility]. He will  see Dr. Rayann Heman in 4 weeks. Referral to Cardiac Rehab was also done.  Discharge Vitals: Blood pressure 133/82, pulse 69, temperature 97.8 F (36.6 C), temperature source Oral, resp. rate 20, height 6' (1.829 m), weight 187 lb 8 oz (85.049 kg), SpO2 99.00%.   Labs: Lab Results  Component Value Date   WBC 6.6 01/09/2014   HGB 12.5* 01/09/2014   HCT 35.7* 01/09/2014   MCV 95.5 01/09/2014   PLT 240 01/09/2014    Recent Labs Lab 01/08/14 1304  01/09/14 0110  NA 141  < > 143  K 3.9  < > 3.9  CL 102  < > 106  CO2 24  --  25  BUN 17  < > 11  CREATININE 0.83  < > 0.73  CALCIUM 9.5  --  8.7  PROT 7.9  --   --   BILITOT 0.6  --   --   ALKPHOS 69  --   --   ALT 22  --   --   AST 22  --   --   GLUCOSE 169*  < > 127*  < > = values in this interval not displayed. Lab Results  Component Value Date   CKTOTAL 114 05/24/2013   CKMB 2.5 09/01/2011   TROPONINI 8.79* 01/09/2014    Lab Results  Component Value Date   CHOL 236* 01/09/2014   CHOL 241* 05/24/2013   CHOL 186 11/30/2012   Lab Results  Component Value Date   HDL 50 01/09/2014   HDL 43.20 05/24/2013   HDL 52.20 11/30/2012   Lab Results  Component Value Date   LDLCALC 157* 01/09/2014   LDLCALC 113* 11/30/2012   LDLCALC 106* 06/21/2012   Lab Results  Component Value Date   TRIG 144 01/09/2014   TRIG 180.0* 05/24/2013   TRIG 105.0 11/30/2012   Lab Results  Component Value Date   CHOLHDL 4.7 01/09/2014   CHOLHDL 6 05/24/2013   CHOLHDL 4 11/30/2012   Lab Results  Component Value Date   LDLDIRECT 168.9 05/24/2013   LDLDIRECT 119.2 04/13/2012   LDLDIRECT 181.0 02/08/2012    Recent Labs  01/08/14 1304  INR 0.85     Disposition:  The patient is being discharged in stable condition.  Follow-up: Follow-up Information   Follow up with [name], PA-C On 01/17/2014. (At 2:40 PM for hospital follow-up)    Specialty:  Physician Assistant   Contact information:   [id number] [place] [address] [phone]        Follow up with Thompson Grayer, MD On 02/25/2014. (At 2:45 PM)    Specialty:  Cardiology   Contact information:   [address] [place] [address] [phone]      Discharge Medications:    Medication List    STOP taking these medications       amLODipine 5 MG tablet  Commonly known as:  NORVASC      TAKE these medications       aspirin 81 MG tablet  Take 81 mg by mouth daily.     Cyanocobalamin 2500 MCG Subl  Place 1 tablet under the tongue daily.     DULoxetine 60 MG capsule  Commonly known as:  CYMBALTA  Take 1 capsule (60  mg total) by mouth daily.     lisinopril 2.5 MG tablet  Commonly known as:  PRINIVIL,ZESTRIL  Take 1 tablet (2.5 mg total) by mouth daily.     LORazepam 0.5 MG tablet  Commonly known as:  ATIVAN  TAKE ONE OR TWO TABLETS TWICE DAILY AS NEEDED FOR ANXIETY     metoprolol tartrate 25 MG tablet  Commonly known as:  LOPRESSOR  Take 1 tablet (25 mg total) by mouth 2 (two) times daily.     nitroGLYCERIN 0.4 MG SL tablet  Commonly known as:  NITROSTAT  Place 1 tablet (0.4 mg total) under the tongue every 5 (five) minutes x 3 doses as needed for chest pain.     ORENCIA [place]  Inject into the skin every 30 (thirty) days.     oxyCODONE-acetaminophen 5-325 MG per tablet  Commonly known as:  ROXICET  Take 1 tablet by mouth every 6 (six) hours as needed.     predniSONE 5 MG tablet  Commonly known as:  DELTASONE  Take 5 mg by mouth daily with breakfast.     rosuvastatin 10 MG tablet  Commonly known as:  CRESTOR  Take 1 tablet (10 mg total) by mouth daily.     Ticagrelor 90 MG Tabs tablet  Commonly known as:  BRILINTA  Take 1 tablet (90 mg total) by mouth 2 (two) times daily.     Vitamin D 1000 UNITS capsule  Take 1,000 Units by mouth daily.     zolpidem 10 MG tablet  Commonly known as:  AMBIEN  TAKE 1 TABLET AT BEDTIME AS NEEDED FOR SLEEP       Duration of Discharge Encounter: Greater than 30 minutes including physician  time.  Manson Passey, PA-C 01/10/2014, 4:12 PM  Thompson Grayer mD

## 2014-01-11 ENCOUNTER — Telehealth: Payer: Self-pay | Admitting: Internal Medicine

## 2014-01-11 MED ORDER — OMEPRAZOLE 40 MG PO CPDR: 40.0000 mg | DELAYED_RELEASE_CAPSULE | Freq: Every day | ORAL | Status: DC

## 2014-01-11 NOTE — Telephone Encounter (Signed)
Spoke with patient.  He is feeling well post hospital but says Prilosec was left off his medication list.  I let him know I would be happy to add back for him  His PCP fills for him

## 2014-01-11 NOTE — Telephone Encounter (Signed)
New message    Home from hosp yesterday---had heart attack and 2 stents.  Wife has questions about discharge medications

## 2014-01-14 ENCOUNTER — Telehealth: Payer: Self-pay | Admitting: Pharmacist

## 2014-01-14 DIAGNOSIS — E785 Hyperlipidemia, unspecified: Secondary | ICD-10-CM

## 2014-01-14 DIAGNOSIS — Z79899 Other long term (current) drug therapy: Secondary | ICD-10-CM

## 2014-01-14 NOTE — Telephone Encounter (Signed)
Message copied by Bishop Limbo on Mon Jan 14, 2014  2:56 PM ------      Message from: Thompson Grayer      Created: Wed Jan 09, 2014 11:10 AM      Regarding: referral to [facility] clinic       Admitted with stemi.  Previously poor tolerance of multiple statins due to myalgias.      He is very interested in enrolling in the lipid clinic.  His wife (also my patient) would also like to establish in your clinic.  I will send her info separately. ------

## 2014-01-14 NOTE — Telephone Encounter (Signed)
Patient with recent STEMI late 12/2013.  He was not on lipid lowering therapy at that time, and had documented failure to Crestor, pravastatin, and lipitor due to muscle aches. Cholesterol in the hospital following STEMI was LDL 157, TG 144, HDL 50, TC 236.  A1C 6.2.  Patient was sent home on Crestor 10 mg qday.  I want to see patient 4 weeks after being on Crestor.  Would like to see a 50% reduction in cholesterol.  May need to consider statin + zetia given h/o failing statins, and may not tolerate high dose statin.  Patient instructed to continue Crestor 10 mg qd as he is tolerating it for the past 1 week, but to reduce to 5 mg qd if he starts developing muscle/joint pain.  He is to recheck lipid/liver in 4 weeks, and see me the following day with his wife who Dr. Rayann Heman is also referring due to her elevated LDL.

## 2014-01-17 ENCOUNTER — Other Ambulatory Visit: Payer: Self-pay | Admitting: *Deleted

## 2014-01-17 ENCOUNTER — Ambulatory Visit (INDEPENDENT_AMBULATORY_CARE_PROVIDER_SITE_OTHER): Payer: Medicare Other | Admitting: Physician Assistant

## 2014-01-17 ENCOUNTER — Encounter: Payer: Self-pay | Admitting: Physician Assistant

## 2014-01-17 VITALS — BP 110/70 | HR 71 | Ht 72.0 in | Wt 185.0 lb

## 2014-01-17 DIAGNOSIS — I219 Acute myocardial infarction, unspecified: Secondary | ICD-10-CM | POA: Diagnosis not present

## 2014-01-17 DIAGNOSIS — I213 ST elevation (STEMI) myocardial infarction of unspecified site: Secondary | ICD-10-CM

## 2014-01-17 DIAGNOSIS — E785 Hyperlipidemia, unspecified: Secondary | ICD-10-CM

## 2014-01-17 DIAGNOSIS — I25810 Atherosclerosis of coronary artery bypass graft(s) without angina pectoris: Secondary | ICD-10-CM | POA: Diagnosis not present

## 2014-01-17 DIAGNOSIS — I10 Essential (primary) hypertension: Secondary | ICD-10-CM

## 2014-01-17 MED ORDER — OMEPRAZOLE 40 MG PO CPDR: 40.0000 mg | DELAYED_RELEASE_CAPSULE | Freq: Every day | ORAL | Status: DC

## 2014-01-17 NOTE — Patient Instructions (Addendum)
Your physician recommends that you continue on your current medications as directed. Please refer to the Current Medication list given to you today.  You have been referred to Cardiac Rehabilitation.  Keep scheduled appointments.

## 2014-01-17 NOTE — Progress Notes (Signed)
[address], [place], [place]  [address] Phone: [phone] Fax:  [phone]  Date:  01/17/2014   ID:  Numan, Zylstra [birth year], MRN [id number]  PCP:  [name], MD  Cardiologist:  Dr. Thompson Grayer    History of Present Illness: [name] is a 80 y.o. male with a hx of HTN, HL, RA on chronic low-dose prednisone, GERD, PACs (previously seen by Dr. Rayann Heman, normal EF), and negative stress test 2012.  He was admitted 2/24-2/26 with an inferior STEMI.  LHC demonstrated an occluded RCA that was treated with DES x 2.  He also had mid LAD 60-70% stenosis with myocardial bridging.  EF was preserved.  He has a hx of intol to statins.  He was started on Crestor and will follow up with Ellin Saba, PharmD in the [facility].    LHC (01/08/14):  mLAD 60-70, mRCA 70 then occluded; EF 55%.  PCI Xience Alpine x 2 (3 x 23 mm and 3.5 x 28 mm) DES to RCA. Echo (01/09/14):  Mild LVH, EF 55-60%, no RWMA.   The patient denies chest pain, shortness of breath, syncope, orthopnea, PND or significant pedal edema. He does note some dyspnea if he "overdoes it."    Recent Labs: 05/24/2013: Direct LDL 168.9  01/08/2014: ALT 22; TSH 0.737  01/09/2014: Creatinine 0.73; HDL Cholesterol 50; Hemoglobin 12.5*; LDL (calc) 157*; Potassium 3.9   Wt Readings from Last 3 Encounters:  01/17/14 185 lb (83.915 kg)  01/10/14 187 lb 8 oz (85.049 kg)  01/10/14 187 lb 8 oz (85.049 kg)     Past Medical History  Diagnosis Date  . Colon polyp   . Hyperlipidemia   . Osteoarthritis   . Migraine with aura   . RA (rheumatoid arthritis)   . Premature atrial contractions   . GERD (gastroesophageal reflux disease)   . Hypertension   . CAD (coronary artery disease) 12/2013    s/p inferior STEMI 01/08/2014; LHC 01/08/14: total RCA occlusion s/p 3.5x51mm Xience DES distal RCA and 3.5x28 mm DES mid RCA, 60-70% mid LAD stenosis, EF 55%    Current Outpatient Prescriptions  Medication Sig Dispense Refill  .  Abatacept (ORENCIA [place]) Inject into the skin every 30 (thirty) days.      [name] aspirin 81 MG tablet Take 81 mg by mouth daily.      . Cholecalciferol (VITAMIN D) 1000 UNITS capsule Take 1,000 Units by mouth daily.        . Cyanocobalamin 2500 MCG SUBL Place 1 tablet under the tongue daily.        . DULoxetine (CYMBALTA) 60 MG capsule Take 1 capsule (60 mg total) by mouth daily.  30 capsule  11  . lisinopril (PRINIVIL,ZESTRIL) 2.5 MG tablet Take 1 tablet (2.5 mg total) by mouth daily.  30 tablet  4  . LORazepam (ATIVAN) 0.5 MG tablet TAKE ONE OR TWO TABLETS TWICE DAILY AS NEEDED FOR ANXIETY  60 tablet  5  . metoprolol tartrate (LOPRESSOR) 25 MG tablet Take 1 tablet (25 mg total) by mouth 2 (two) times daily.  60 tablet  4  . nitroGLYCERIN (NITROSTAT) 0.4 MG SL tablet Place 1 tablet (0.4 mg total) under the tongue every 5 (five) minutes x 3 doses as needed for chest pain.  30 tablet  12  . omeprazole (PRILOSEC) 40 MG capsule Take 1 capsule (40 mg total) by mouth daily.      [name] oxyCODONE-acetaminophen (ROXICET) 5-325 MG per tablet Take  1 tablet by mouth every 6 (six) hours as needed.  120 tablet  0  . predniSONE (DELTASONE) 5 MG tablet Take 5 mg by mouth daily with breakfast.      . rosuvastatin (CRESTOR) 10 MG tablet Take 1 tablet (10 mg total) by mouth daily.  30 tablet  4  . Ticagrelor (BRILINTA) 90 MG TABS tablet Take 1 tablet (90 mg total) by mouth 2 (two) times daily.  60 tablet  12  . zolpidem (AMBIEN) 10 MG tablet TAKE 1 TABLET AT BEDTIME AS NEEDED FOR SLEEP  90 tablet  1   No current facility-administered medications for this visit.    Allergies:   Atorvastatin; Methotrexate; Pravastatin sodium; and Rosuvastatin   Social History:  The patient  reports that he has quit smoking. He has never used smokeless tobacco. He reports that he drinks about 4.2 ounces of alcohol per week. He reports that he does not use illicit drugs.   Family History:  The patient's family history includes Heart disease  in his father; Hypertension in his mother. There is no history of Colon cancer.   ROS:  Please see the history of present illness.      All other systems reviewed and negative.   PHYSICAL EXAM: VS:  BP 110/70  Pulse 71  Ht 6' (1.829 m)  Wt 185 lb (83.915 kg)  BMI 25.08 kg/m2 Well nourished, well developed, in no acute distress HEENT: normal Neck: no JVD Cardiac:  normal S1, S2; RRR; no murmur Lungs:  clear to auscultation bilaterally, no wheezing, rhonchi or rales Abd: soft, nontender, no hepatomegaly Ext: no edemaright groin without hematoma or bruit  Skin: warm and dry Neuro:  CNs 2-12 intact, no focal abnormalities noted  EKG:  NSR, HR 71, inferior Q waves     ASSESSMENT AND PLAN:  1. CAD, s/p Recent Inferior STEMI:  He is doing well after recent inferior STEMI treated with DES x2 to the RCA. Residual LAD disease is treated medically.  We discussed the importance of dual antiplatelet therapy. Continue aspirin, Brilinta, statin, beta blocker.  He will be referred to cardiac rehabilitation. 2. Hyperlipidemia:  Continue Crestor. Followup with the clinic. 3. Hypertension:  Controlled. 4. Disposition:  Follow up with Dr. Rayann Heman 02/25/14 as planned.  Signed, [name], PA-C  01/17/2014 3:18 PM

## 2014-01-24 ENCOUNTER — Telehealth: Payer: Self-pay | Admitting: Internal Medicine

## 2014-01-24 NOTE — Telephone Encounter (Signed)
New problem   Pt need to speak to you concerning his rehab. Please call pt.

## 2014-01-24 NOTE — Telephone Encounter (Signed)
Starting rehab on 01/31/14

## 2014-01-29 ENCOUNTER — Encounter: Payer: Self-pay | Admitting: Internal Medicine

## 2014-01-29 ENCOUNTER — Ambulatory Visit (INDEPENDENT_AMBULATORY_CARE_PROVIDER_SITE_OTHER): Payer: Medicare Other | Admitting: Internal Medicine

## 2014-01-29 VITALS — BP 114/80 | HR 80 | Temp 97.2°F | Resp 16 | Wt 182.0 lb

## 2014-01-29 DIAGNOSIS — I213 ST elevation (STEMI) myocardial infarction of unspecified site: Secondary | ICD-10-CM

## 2014-01-29 DIAGNOSIS — M545 Low back pain, unspecified: Secondary | ICD-10-CM | POA: Diagnosis not present

## 2014-01-29 DIAGNOSIS — I219 Acute myocardial infarction, unspecified: Secondary | ICD-10-CM | POA: Diagnosis not present

## 2014-01-29 DIAGNOSIS — IMO0001 Reserved for inherently not codable concepts without codable children: Secondary | ICD-10-CM

## 2014-01-29 DIAGNOSIS — I25810 Atherosclerosis of coronary artery bypass graft(s) without angina pectoris: Secondary | ICD-10-CM

## 2014-01-29 DIAGNOSIS — F4321 Adjustment disorder with depressed mood: Secondary | ICD-10-CM

## 2014-01-29 DIAGNOSIS — M1990 Unspecified osteoarthritis, unspecified site: Secondary | ICD-10-CM

## 2014-01-29 DIAGNOSIS — E782 Mixed hyperlipidemia: Secondary | ICD-10-CM

## 2014-01-29 DIAGNOSIS — M542 Cervicalgia: Secondary | ICD-10-CM

## 2014-01-29 DIAGNOSIS — M069 Rheumatoid arthritis, unspecified: Secondary | ICD-10-CM

## 2014-01-29 MED ORDER — OXYCODONE-ACETAMINOPHEN 5-325 MG PO TABS: 1.0000 | ORAL_TABLET | Freq: Four times a day (QID) | ORAL | Status: DC | PRN

## 2014-01-29 NOTE — Assessment & Plan Note (Signed)
Better  

## 2014-01-29 NOTE — Assessment & Plan Note (Signed)
He is on Crestor now. F/u w/card lipid clinic

## 2014-01-29 NOTE — Patient Instructions (Signed)
CoQ10 for achyness

## 2014-01-29 NOTE — Assessment & Plan Note (Signed)
Continue with current prescription therapy as reflected on the Med list.  

## 2014-01-29 NOTE — Assessment & Plan Note (Signed)
Continue with current prescription prn therapy as reflected on the Med list.  

## 2014-01-29 NOTE — Assessment & Plan Note (Signed)
Try CoQ10, Vit D

## 2014-01-29 NOTE — Progress Notes (Signed)
Subjective:    HPI F/u recent MI 3 wks ago F/u family stress - better: pain in the back is worse; sleep - better... The patient presents for a follow-up of a new hypertension detected, chronic dyslipidemia, RA controlled partially with medicines. He was recently checked again by Dr Carloyn Manner for severe neck OA, using neck traction now - better. Hydrocodone is not working too well. Oxycodone did work well. .   F/u fatigue, RA pains - worse due to stress  He is on arensia now - not working (remicade stopped working as well). Dr Ouida Sills is treating RA   Wt Readings from Last 3 Encounters:  01/29/14 182 lb (82.555 kg)  01/17/14 185 lb (83.915 kg)  01/10/14 187 lb 8 oz (85.049 kg)   BP Readings from Last 3 Encounters:  01/29/14 114/80  01/17/14 110/70  01/10/14 133/82     Review of Systems  Constitutional: Negative for appetite change, fatigue and unexpected weight change.  HENT: Negative for congestion, nosebleeds, sneezing, sore throat and trouble swallowing.   Eyes: Negative for itching and visual disturbance.  Respiratory: Negative for cough and shortness of breath.   Cardiovascular: Negative for chest pain, palpitations and leg swelling.  Gastrointestinal: Negative for nausea, diarrhea, blood in stool and abdominal distention.  Genitourinary: Negative for frequency and hematuria.  Musculoskeletal: Positive for arthralgias. Negative for back pain, gait problem, joint swelling and neck pain.  Skin: Negative for rash.  Neurological: Negative for dizziness, tremors, speech difficulty and weakness.  Psychiatric/Behavioral: Positive for sleep disturbance. Negative for suicidal ideas, dysphoric mood and agitation. The patient is nervous/anxious.        Objective:   Physical Exam  Constitutional: He is oriented to person, place, and time. He appears well-developed.  HENT:  Mouth/Throat: Oropharynx is clear and moist.  Eyes: Conjunctivae are normal. Pupils are equal, round, and  reactive to light.  Neck: Normal range of motion. No JVD present. No thyromegaly present.  Cardiovascular: Normal rate, regular rhythm, normal heart sounds and intact distal pulses.  Exam reveals no gallop and no friction rub.   No murmur heard. Pulmonary/Chest: Effort normal and breath sounds normal. No respiratory distress. He has no wheezes. He has no rales. He exhibits no tenderness.  Abdominal: Soft. Bowel sounds are normal. He exhibits no distension and no mass. There is no tenderness. There is no rebound and no guarding.  Musculoskeletal: Normal range of motion. He exhibits tenderness. He exhibits no edema.  Lymphadenopathy:    He has no cervical adenopathy.  Neurological: He is alert and oriented to person, place, and time. He has normal reflexes. No cranial nerve deficit. He exhibits normal muscle tone. Coordination normal.  Skin: Skin is warm and dry. No rash noted.  Psychiatric: He has a normal mood and affect. His behavior is normal. Judgment and thought content normal.  LS sensitive w/ROM  Lab Results  Component Value Date   WBC 6.6 01/09/2014   HGB 12.5* 01/09/2014   HCT 35.7* 01/09/2014   PLT 240 01/09/2014   GLUCOSE 127* 01/09/2014   CHOL 236* 01/09/2014   TRIG 144 01/09/2014   HDL 50 01/09/2014   LDLDIRECT 168.9 05/24/2013   LDLCALC 157* 01/09/2014   ALT 22 01/08/2014   AST 22 01/08/2014   NA 143 01/09/2014   K 3.9 01/09/2014   CL 106 01/09/2014   CREATININE 0.73 01/09/2014   BUN 11 01/09/2014   CO2 25 01/09/2014   TSH 0.737 01/08/2014   PSA 1.52 06/04/2011  INR 0.85 01/08/2014   HGBA1C 6.2* 01/08/2014             Assessment & Plan:

## 2014-01-29 NOTE — Progress Notes (Signed)
Pre visit review using our clinic review tool, if applicable. No additional management support is needed unless otherwise documented below in the visit note. 

## 2014-01-31 ENCOUNTER — Encounter (HOSPITAL_COMMUNITY)
Admission: RE | Admit: 2014-01-31 | Discharge: 2014-01-31 | Disposition: A | Discharge status: Home / Self Care | Payer: Medicare Other | Source: Ambulatory Visit | Attending: Rheumatology | Admitting: Rheumatology

## 2014-01-31 DIAGNOSIS — M069 Rheumatoid arthritis, unspecified: Secondary | ICD-10-CM | POA: Insufficient documentation

## 2014-01-31 NOTE — Progress Notes (Signed)
Cardiac Rehab Medication Review by a Pharmacist  Does the patient  feel that his/her medications are working for him/her?  yes  Has the patient been experiencing any side effects to the medications prescribed?  no  Does the patient measure his/her own blood pressure or blood glucose at home?  yes   Does the patient have any problems obtaining medications due to transportation or finances?   no  Understanding of regimen: good Understanding of indications: good Potential of compliance: good    Pharmacist comments: Patient has good understanding of medications.  He is taking them as prescribed.   Thank you, Vivia Ewing, PharmD Clinical Pharmacist - Resident Pager: [phone] Pharmacy: [phone] 01/31/2014 9:05 AM

## 2014-02-01 DIAGNOSIS — M069 Rheumatoid arthritis, unspecified: Secondary | ICD-10-CM | POA: Diagnosis not present

## 2014-02-04 ENCOUNTER — Other Ambulatory Visit: Payer: Self-pay | Admitting: Internal Medicine

## 2014-02-04 ENCOUNTER — Encounter (HOSPITAL_COMMUNITY)
Admission: RE | Admit: 2014-02-04 | Discharge: 2014-02-04 | Disposition: A | Discharge status: Home / Self Care | Payer: Medicare Other | Source: Ambulatory Visit | Attending: Internal Medicine | Admitting: Internal Medicine

## 2014-02-04 DIAGNOSIS — M069 Rheumatoid arthritis, unspecified: Secondary | ICD-10-CM | POA: Diagnosis not present

## 2014-02-04 NOTE — Progress Notes (Signed)
Pt started cardiac rehab today.  Pt tolerated light exercise without difficulty.  VSS, telemetry-NSR. Asymptomatic. PHQ-1.  Pt does have some sadness r/t recent illness and the changes as a result of.  This is resolving.  Pt does not exhibit barriers to rehab participation, exhibits positive coping skills with supportive family.  Offered emotional support and reassurance.  Will continue to monitor. Pt oriented to exercise equipment and routine.  Understanding verbalized.

## 2014-02-06 ENCOUNTER — Other Ambulatory Visit (INDEPENDENT_AMBULATORY_CARE_PROVIDER_SITE_OTHER): Payer: Medicare Other

## 2014-02-06 ENCOUNTER — Encounter (HOSPITAL_COMMUNITY)
Admission: RE | Admit: 2014-02-06 | Discharge: 2014-02-06 | Disposition: A | Discharge status: Home / Self Care | Payer: Medicare Other | Source: Ambulatory Visit | Attending: Internal Medicine | Admitting: Internal Medicine

## 2014-02-06 DIAGNOSIS — Z79899 Other long term (current) drug therapy: Secondary | ICD-10-CM | POA: Diagnosis not present

## 2014-02-06 DIAGNOSIS — E785 Hyperlipidemia, unspecified: Secondary | ICD-10-CM | POA: Diagnosis not present

## 2014-02-06 LAB — LIPID PANEL
Cholesterol: 180 mg/dL (ref 0–200)
HDL: 52.8 mg/dL (ref >39.00)
LDL Cholesterol: 102 mg/dL — ABNORMAL HIGH (ref 0–99)
Total CHOL/HDL Ratio: 3
Triglycerides: 126 mg/dL (ref 0.0–149.0)
VLDL: 25.2 mg/dL (ref 0.0–40.0)

## 2014-02-06 LAB — HEPATIC FUNCTION PANEL
ALT: 30 U/L (ref 0–53)
AST: 23 U/L (ref 0–37)
Albumin: 4 g/dL (ref 3.5–5.2)
Alkaline Phosphatase: 51 U/L (ref 39–117)
Bilirubin, Direct: 0.1 mg/dL (ref 0.0–0.3)
Total Bilirubin: 0.6 mg/dL (ref 0.3–1.2)
Total Protein: 6.9 g/dL (ref 6.0–8.3)

## 2014-02-07 ENCOUNTER — Ambulatory Visit (INDEPENDENT_AMBULATORY_CARE_PROVIDER_SITE_OTHER): Payer: Medicare Other | Admitting: Pharmacist

## 2014-02-07 VITALS — Wt 183.0 lb

## 2014-02-07 DIAGNOSIS — Z79899 Other long term (current) drug therapy: Secondary | ICD-10-CM | POA: Diagnosis not present

## 2014-02-07 DIAGNOSIS — E782 Mixed hyperlipidemia: Secondary | ICD-10-CM

## 2014-02-07 DIAGNOSIS — I25810 Atherosclerosis of coronary artery bypass graft(s) without angina pectoris: Secondary | ICD-10-CM

## 2014-02-07 MED ORDER — EZETIMIBE 10 MG PO TABS: 10.0000 mg | ORAL_TABLET | Freq: Every day | ORAL | Status: DC

## 2014-02-07 NOTE — Patient Instructions (Signed)
1.  Continue Crestor 10 mg once daily. 2.  Start Zetia 10 mg pills - take 1/2 tablet daily. 3.  Continue low fat diet. 4.  Exercise 3 days per week 5.  Recheck cholesterol and liver in 3 months (05/27/14 - fasting labs in AM), and see me 1 day later (05/28/14 - 9 AM)

## 2014-02-07 NOTE — Progress Notes (Signed)
Patient is a pleasant 80 y.o WM referred to lipid clinic by Dr. Rayann Heman following an MI in 12/2013 and inability to tolerated lipid lowering medications historically.  He was discharged home on Crestor 10 mg qd given LDL of 157 mg/dL at time of his MI, and is tolerating it well.  Patient has documented failure of Crestor in the past, and also pravastatin and lipitor due to muscle aches.  Patient has changed his diet since his MI last month, and is now eating a much lower fat diet.  Patient and his wife were interested in sitting down with me in lipid clinic to discuss dietary and treatment options moving forward.  RF:  MI (12/2013), HTN, age - LDL goal < 70 Meds:  Crestor 10 mg qd Intolerant:  Crestor in the past, Lipitor, pravastatin (muscle aches). Patient thinks he may have tried Zetia in the past but not sure.  Social history:  Drinks 1-2 drinks per night (typically bourbon), former smoker, retired. Diet:  This improved over past month since his MI.  Now, breakfast:  High fiber cereal with fruit.  Lunch:  Sandwich and salad typically.  Dinner:  Scientist, research (physical sciences) and vegetable typically. Eats red meat 2 times per week.  Now eating a lot more fish, in particular sardines.  Stopped eating chips and fried foods, and eating a lot more veggies now.  Doesn't drink soda.  Eats out 2-3 times per week for lunch or dinner. Exercise:  Was working out at the gym at [employer] three times per week prior to his MI.  Now is in cardiac rehab, and will restart at the gym when this is over.  Labs:   01/2014:  LDL 102, HDL 53, TC 180, TG 126, LFTs normal (Crestor 10 mg qd x 4 weeks) 12/2013:  LDL 157, HDL 50, TC 236, TG 144 (not on meds.  At time of MI)  Current Outpatient Prescriptions  Medication Sig Dispense Refill  . Abatacept (ORENCIA [place]) Inject into the skin every 30 (thirty) days.      [name] aspirin 81 MG tablet Take 81 mg by mouth daily.      . Cholecalciferol (VITAMIN D) 1000 UNITS capsule Take 1,000 Units by  mouth daily.       . Cyanocobalamin 2500 MCG SUBL Place 1 tablet under the tongue daily.       . DULoxetine (CYMBALTA) 60 MG capsule Take 1 capsule (60 mg total) by mouth daily.  30 capsule  11  . lisinopril (PRINIVIL,ZESTRIL) 2.5 MG tablet Take 1 tablet (2.5 mg total) by mouth daily.  30 tablet  4  . LORazepam (ATIVAN) 0.5 MG tablet Take 0.5 mg by mouth at bedtime as needed for sleep.      . metoprolol tartrate (LOPRESSOR) 25 MG tablet Take 1 tablet (25 mg total) by mouth 2 (two) times daily.  60 tablet  4  . nitroGLYCERIN (NITROSTAT) 0.4 MG SL tablet Place 1 tablet (0.4 mg total) under the tongue every 5 (five) minutes x 3 doses as needed for chest pain.  30 tablet  12  . omeprazole (PRILOSEC) 40 MG capsule Take 40 mg by mouth daily.      [name] oxyCODONE-acetaminophen (ROXICET) 5-325 MG per tablet Take 1 tablet by mouth every 6 (six) hours as needed.  120 tablet  0  . predniSONE (DELTASONE) 5 MG tablet Take 5 mg by mouth daily with breakfast.      . Propylene Glycol (SYSTANE BALANCE OP) Apply 1 drop to eye  as needed (dry eye).      . rosuvastatin (CRESTOR) 10 MG tablet Take 1 tablet (10 mg total) by mouth daily.  30 tablet  4  . zolpidem (AMBIEN) 10 MG tablet Take 10 mg by mouth at bedtime as needed for sleep.      . Ticagrelor (BRILINTA) 90 MG TABS tablet Take 1 tablet (90 mg total) by mouth 2 (two) times daily.  60 tablet  12   No current facility-administered medications for this visit.   Family History  Problem Relation Age of Onset  . Colon cancer Neg Hx   . Hypertension Mother   . Heart disease Father    Allergies  Allergen Reactions  . Methotrexate Other (See Comments)    REACTION: tachycardia  . Atorvastatin Other (See Comments)    Muscle pain, leg cramps  . Pravastatin Sodium Other (See Comments)    REACTION: cramps, fatigue

## 2014-02-07 NOTE — Assessment & Plan Note (Addendum)
Patient tolerating Crestor 10 mg qd well, and given his failure of other statins, will not increase this dose at this time.  He has a questionable history of Zetia use, and he can't remember if he had problems with this.  Zetia 5 mg daily has historically been better tolerated than 10 mg qd, and gives a similar LDL reduction.  Patient agreeable to add this to Crestor 10 mg qd instead of titrating Crestor.  Would like to get LDL to at least < 80 mg/dL (50% reduction), and would prefer < 70.  Patient will continue working out 3 days per week (cardiac rehab or [employer] after rehab ends).  We discussed low fat diet and he will work on this as well.  Recheck blood work in 3 months, and see me the next day. Plan: 1.  Continue Crestor 10 mg once daily. 2.  Start Zetia 10 mg pills - take 1/2 tablet daily. 3.  Continue low fat diet. 4.  Exercise 3 days per week 5.  Recheck cholesterol and liver in 3 months (05/27/14 - fasting labs in AM), and see me 1 day later (05/28/14 - 9 AM)

## 2014-02-08 ENCOUNTER — Encounter (HOSPITAL_COMMUNITY)
Admission: RE | Admit: 2014-02-08 | Discharge: 2014-02-08 | Disposition: A | Discharge status: Home / Self Care | Payer: Medicare Other | Source: Ambulatory Visit | Attending: Internal Medicine | Admitting: Internal Medicine

## 2014-02-08 DIAGNOSIS — E785 Hyperlipidemia, unspecified: Secondary | ICD-10-CM | POA: Diagnosis not present

## 2014-02-08 DIAGNOSIS — I219 Acute myocardial infarction, unspecified: Secondary | ICD-10-CM | POA: Diagnosis not present

## 2014-02-08 DIAGNOSIS — I10 Essential (primary) hypertension: Secondary | ICD-10-CM

## 2014-02-08 DIAGNOSIS — I25810 Atherosclerosis of coronary artery bypass graft(s) without angina pectoris: Secondary | ICD-10-CM | POA: Diagnosis not present

## 2014-02-11 ENCOUNTER — Encounter (HOSPITAL_COMMUNITY)
Admission: RE | Admit: 2014-02-11 | Discharge: 2014-02-11 | Disposition: A | Discharge status: Home / Self Care | Payer: Medicare Other | Source: Ambulatory Visit | Attending: Internal Medicine | Admitting: Internal Medicine

## 2014-02-13 ENCOUNTER — Encounter (HOSPITAL_COMMUNITY)
Admission: RE | Admit: 2014-02-13 | Discharge: 2014-02-13 | Disposition: A | Discharge status: Home / Self Care | Payer: Medicare Other | Source: Ambulatory Visit | Attending: Rheumatology | Admitting: Rheumatology

## 2014-02-13 DIAGNOSIS — I219 Acute myocardial infarction, unspecified: Secondary | ICD-10-CM | POA: Diagnosis present

## 2014-02-13 DIAGNOSIS — M069 Rheumatoid arthritis, unspecified: Secondary | ICD-10-CM | POA: Insufficient documentation

## 2014-02-13 DIAGNOSIS — Z9861 Coronary angioplasty status: Secondary | ICD-10-CM | POA: Diagnosis not present

## 2014-02-15 ENCOUNTER — Encounter (HOSPITAL_COMMUNITY)
Admission: RE | Admit: 2014-02-15 | Discharge: 2014-02-15 | Disposition: A | Discharge status: Home / Self Care | Payer: Medicare Other | Source: Ambulatory Visit | Attending: Internal Medicine | Admitting: Internal Medicine

## 2014-02-15 DIAGNOSIS — I219 Acute myocardial infarction, unspecified: Secondary | ICD-10-CM | POA: Diagnosis not present

## 2014-02-18 ENCOUNTER — Encounter (HOSPITAL_COMMUNITY)
Admission: RE | Admit: 2014-02-18 | Discharge: 2014-02-18 | Disposition: A | Discharge status: Home / Self Care | Payer: Medicare Other | Source: Ambulatory Visit | Attending: Internal Medicine | Admitting: Internal Medicine

## 2014-02-18 DIAGNOSIS — I219 Acute myocardial infarction, unspecified: Secondary | ICD-10-CM | POA: Diagnosis not present

## 2014-02-18 NOTE — Progress Notes (Signed)
[name] 80 y.o. male Nutrition Note Spoke with pt.  Nutrition Plan and Nutrition Survey goals reviewed with pt. Pt is following Step 1 of the Therapeutic Lifestyle Changes diet. Pt reports he has been improving on adopting the Therapeutic Lifestyle Changes in his diet by decreasing his amount of saturated fat intake, increasing his fruit and vegetable intake, and increasing his fiber intake. Pt was educated that it is fine that regular foods (cheese, hamburgers, barbeque) may be moderately consumed every once in a while. Last A1c indicates pt is pre-diabetic. Pt was educated on what pre-diabetes is and its risk factor for diabetes. Pt was educated on monitoring his carbohydrate intake by expressing the importance of the appropriate serving sizes of carbohydrate-containing foods. Noted pt on Prednisone, which may additionally increase blood glucose. Pt expressed understanding of the information reviewed. Pt aware of nutrition education classes offered and is planning on attending nutrition classes if his schedule permits. Pt was aware if he is unable to attend the classes, a handout of the information discussed during the classes may be given.   Nutrition Diagnosis   Food-and nutrition-related knowledge deficit related to lack of exposure to information as related to diagnosis of: ? CVD? Pre-DM (A1c 6.2)   Nutrition RX/ Estimated Daily Nutrition Needs for: maintenance 2250-2600 Kcal, 75-85 gm fat, 15-17 gm sat fat, 2.2-2.6 gm trans-fat, <1500 mg sodium  Nutrition Intervention   Pt's individual nutrition plan reviewed with pt.   Benefits of adopting Therapeutic Lifestyle Changes discussed when Medficts reviewed.   Pt to attend the  ? Nutrition I class                     ? Nutrition II class   Continue client-centered nutrition education by RD, as part of interdisciplinary care.  Goal(s)   Pt to describe the potential benefits of adopting Therapeutic Lifestyle Changes   Pt to describe the  benefit of including fruits, vegetables, whole grains, and low-fat dairy products in a heart healthy meal plan.  Monitor and Evaluate progress toward nutrition goal with team. Nutrition Risk: Change to moderate  Kallie Locks Dietetic Intern  02/18/2014 9:52 AM

## 2014-02-18 NOTE — Progress Notes (Signed)
Derek Mound, M.Ed, RD, LDN, CDE 02/18/2014 12:34 PM

## 2014-02-18 NOTE — Progress Notes (Signed)
I have reviewed home exercise with [name]. The patient was advised to walk 2-4 days per week outside of [facility] [place] for 30 minutes continuously.  Pt will also complete one additional day of hand weights outside of CRP II.  Progression of exercise prescription was discussed.  Reviewed THR, pulse, RPE, sign and symptoms, NTG use and when to call 911 or MD.  Pt voiced understanding.  [id number]  Evan Endo, MS, ACSM RCEP 02/18/2014 9:31 AM

## 2014-02-20 ENCOUNTER — Encounter (HOSPITAL_COMMUNITY)
Admission: RE | Admit: 2014-02-20 | Discharge: 2014-02-20 | Disposition: A | Discharge status: Home / Self Care | Payer: Medicare Other | Source: Ambulatory Visit | Attending: Internal Medicine | Admitting: Internal Medicine

## 2014-02-20 DIAGNOSIS — I219 Acute myocardial infarction, unspecified: Secondary | ICD-10-CM | POA: Diagnosis not present

## 2014-02-22 ENCOUNTER — Encounter (HOSPITAL_COMMUNITY)
Admission: RE | Admit: 2014-02-22 | Discharge: 2014-02-22 | Disposition: A | Discharge status: Home / Self Care | Payer: Medicare Other | Source: Ambulatory Visit | Attending: Internal Medicine | Admitting: Internal Medicine

## 2014-02-22 DIAGNOSIS — I219 Acute myocardial infarction, unspecified: Secondary | ICD-10-CM | POA: Diagnosis not present

## 2014-02-25 ENCOUNTER — Ambulatory Visit (INDEPENDENT_AMBULATORY_CARE_PROVIDER_SITE_OTHER): Payer: Medicare Other | Admitting: Internal Medicine

## 2014-02-25 ENCOUNTER — Encounter: Payer: Self-pay | Admitting: Internal Medicine

## 2014-02-25 ENCOUNTER — Encounter (HOSPITAL_COMMUNITY)
Admission: RE | Admit: 2014-02-25 | Discharge: 2014-02-25 | Disposition: A | Discharge status: Home / Self Care | Payer: Medicare Other | Source: Ambulatory Visit | Attending: Internal Medicine | Admitting: Internal Medicine

## 2014-02-25 VITALS — BP 140/79 | HR 76 | Ht 71.5 in | Wt 180.6 lb

## 2014-02-25 DIAGNOSIS — I2510 Atherosclerotic heart disease of native coronary artery without angina pectoris: Secondary | ICD-10-CM | POA: Insufficient documentation

## 2014-02-25 DIAGNOSIS — I10 Essential (primary) hypertension: Secondary | ICD-10-CM

## 2014-02-25 DIAGNOSIS — R002 Palpitations: Secondary | ICD-10-CM | POA: Diagnosis not present

## 2014-02-25 DIAGNOSIS — I219 Acute myocardial infarction, unspecified: Secondary | ICD-10-CM | POA: Diagnosis not present

## 2014-02-25 LAB — BASIC METABOLIC PANEL
BUN: 18 mg/dL (ref 6–23)
CO2: 27 mEq/L (ref 19–32)
Calcium: 9.3 mg/dL (ref 8.4–10.5)
Chloride: 103 mEq/L (ref 96–112)
Creatinine, Ser: 0.9 mg/dL (ref 0.4–1.5)
GFR: 90.08 mL/min (ref >60.00)
Glucose, Bld: 108 mg/dL — ABNORMAL HIGH (ref 70–99)
Potassium: 3.6 mEq/L (ref 3.5–5.1)
Sodium: 139 mEq/L (ref 135–145)

## 2014-02-25 MED ORDER — LISINOPRIL 5 MG PO TABS: 5.0000 mg | ORAL_TABLET | Freq: Every day | ORAL | Status: DC

## 2014-02-25 NOTE — Patient Instructions (Signed)
Your physician recommends that you schedule a follow-up appointment in: 3 months with Dr Rayann Heman  Your physician recommends that you return for lab work today [facility]  Your physician has recommended you make the following change in your medication:  1) Increase Lisinopril to 5mg  daily

## 2014-02-25 NOTE — Progress Notes (Signed)
PCP: [name], MD  [name] is a 80 y.o. male who presents today for routine cardiology followup.  Since last being seen by [name], the patient reports doing very well.  He is participating in cardiac rehab without limitation.  Today, he denies symptoms of palpitations, chest pain, shortness of breath,  lower extremity edema, dizziness, presyncope, or syncope.  The patient is otherwise without complaint today.   Past Medical History  Diagnosis Date  . Colon polyp   . Hyperlipidemia   . Osteoarthritis   . Migraine with aura   . RA (rheumatoid arthritis)   . Premature atrial contractions   . GERD (gastroesophageal reflux disease)   . Hypertension   . CAD (coronary artery disease) 12/2013    s/p inferior STEMI 01/08/2014; LHC 01/08/14: total RCA occlusion s/p 3.5x48mm Xience DES distal RCA and 3.5x28 mm DES mid RCA, 60-70% mid LAD stenosis, EF 55%  . Palpitations   . STEMI (ST elevation myocardial infarction)    Past Surgical History  Procedure Laterality Date  . Back surgery  2004    Current Outpatient Prescriptions  Medication Sig Dispense Refill  . Abatacept (ORENCIA [place]) Inject into the skin every 30 (thirty) days.      [name] aspirin 81 MG tablet Take 81 mg by mouth daily.      . Cholecalciferol (VITAMIN D) 1000 UNITS capsule Take 1,000 Units by mouth daily.       . Cyanocobalamin 2500 MCG SUBL Place 1 tablet under the tongue daily.       . DULoxetine (CYMBALTA) 60 MG capsule Take 1 capsule (60 mg total) by mouth daily.  30 capsule  11  . ezetimibe (ZETIA) 10 MG tablet Take 1 tablet (10 mg total) by mouth daily.  30 tablet  11  . lisinopril (PRINIVIL,ZESTRIL) 5 MG tablet Take 1 tablet (5 mg total) by mouth daily.  90 tablet  3  . LORazepam (ATIVAN) 0.5 MG tablet TAKE ONE OR TWO TABLETS TWICE DAILY AS NEEDED FOR ANXIETY  60 tablet  3  . metoprolol tartrate (LOPRESSOR) 25 MG tablet Take 1 tablet (25 mg total) by mouth 2 (two) times daily.  60 tablet  4  . nitroGLYCERIN  (NITROSTAT) 0.4 MG SL tablet Place 1 tablet (0.4 mg total) under the tongue every 5 (five) minutes x 3 doses as needed for chest pain.  30 tablet  12  . omeprazole (PRILOSEC) 40 MG capsule Take 40 mg by mouth daily.      [name] oxyCODONE-acetaminophen (ROXICET) 5-325 MG per tablet Take 1 tablet by mouth every 6 (six) hours as needed.  120 tablet  0  . predniSONE (DELTASONE) 5 MG tablet Take 5 mg by mouth daily with breakfast.      . Propylene Glycol (SYSTANE BALANCE OP) Apply 1 drop to eye as needed (dry eye).      . rosuvastatin (CRESTOR) 10 MG tablet Take 1 tablet (10 mg total) by mouth daily.  30 tablet  4  . Ticagrelor (BRILINTA) 90 MG TABS tablet Take 1 tablet (90 mg total) by mouth 2 (two) times daily.  60 tablet  12  . zolpidem (AMBIEN) 10 MG tablet Take 10 mg by mouth at bedtime as needed for sleep.       No current facility-administered medications for this visit.    Physical Exam: Filed Vitals:   02/25/14 1454  BP: 140/79  Pulse: 76  Height: 5' 11.5" (1.816 m)  Weight: 180 lb 9.6 oz (81.92 kg)  GEN- The patient is well appearing, alert and oriented x 3 today.   Head- normocephalic, atraumatic Eyes-  Sclera clear, conjunctiva pink Ears- hearing intact Oropharynx- clear Lungs- Clear to ausculation bilaterally, normal work of breathing Heart- Regular rate and rhythm, no murmurs, rubs or gallops, PMI not laterally displaced GI- soft, NT, ND, + BS Extremities- no clubbing, cyanosis, or edema  ekg today reveals sinus rhythm 76 bpm, otherwise normal ekg Echo is reviewed  Assessment and Plan:  1. Cad Doing well with cardiac rehab No ischemic symptoms  2. htn Above goal Increase lisinopril to 5mg  dailly bmet today  3. HL Stable No change required today Followed in lipid clinic  4. pacs Well controlled  Return in 3 months

## 2014-02-27 ENCOUNTER — Encounter (HOSPITAL_COMMUNITY)
Admission: RE | Admit: 2014-02-27 | Discharge: 2014-02-27 | Disposition: A | Discharge status: Home / Self Care | Payer: Medicare Other | Source: Ambulatory Visit | Attending: Internal Medicine | Admitting: Internal Medicine

## 2014-02-27 DIAGNOSIS — I219 Acute myocardial infarction, unspecified: Secondary | ICD-10-CM | POA: Diagnosis not present

## 2014-02-28 DIAGNOSIS — M4712 Other spondylosis with myelopathy, cervical region: Secondary | ICD-10-CM | POA: Diagnosis not present

## 2014-02-28 DIAGNOSIS — M069 Rheumatoid arthritis, unspecified: Secondary | ICD-10-CM | POA: Diagnosis not present

## 2014-02-28 DIAGNOSIS — M79609 Pain in unspecified limb: Secondary | ICD-10-CM | POA: Diagnosis not present

## 2014-03-01 ENCOUNTER — Encounter (HOSPITAL_COMMUNITY)
Admission: RE | Admit: 2014-03-01 | Discharge: 2014-03-01 | Disposition: A | Discharge status: Home / Self Care | Payer: Medicare Other | Source: Ambulatory Visit | Attending: Internal Medicine | Admitting: Internal Medicine

## 2014-03-01 DIAGNOSIS — M069 Rheumatoid arthritis, unspecified: Secondary | ICD-10-CM | POA: Diagnosis not present

## 2014-03-01 DIAGNOSIS — I219 Acute myocardial infarction, unspecified: Secondary | ICD-10-CM | POA: Diagnosis not present

## 2014-03-04 ENCOUNTER — Encounter (HOSPITAL_COMMUNITY)
Admission: RE | Admit: 2014-03-04 | Discharge: 2014-03-04 | Disposition: A | Discharge status: Home / Self Care | Payer: Medicare Other | Source: Ambulatory Visit | Attending: Internal Medicine | Admitting: Internal Medicine

## 2014-03-04 ENCOUNTER — Encounter (HOSPITAL_COMMUNITY): Admission: RE | Admit: 2014-03-04 | Payer: Medicare Other | Source: Ambulatory Visit

## 2014-03-04 ENCOUNTER — Telehealth (HOSPITAL_COMMUNITY): Payer: Self-pay | Admitting: *Deleted

## 2014-03-04 DIAGNOSIS — I219 Acute myocardial infarction, unspecified: Secondary | ICD-10-CM | POA: Diagnosis not present

## 2014-03-06 ENCOUNTER — Encounter (HOSPITAL_COMMUNITY)
Admission: RE | Admit: 2014-03-06 | Discharge: 2014-03-06 | Disposition: A | Discharge status: Home / Self Care | Payer: Medicare Other | Source: Ambulatory Visit | Attending: Internal Medicine | Admitting: Internal Medicine

## 2014-03-06 DIAGNOSIS — I219 Acute myocardial infarction, unspecified: Secondary | ICD-10-CM | POA: Diagnosis not present

## 2014-03-07 DIAGNOSIS — Z961 Presence of intraocular lens: Secondary | ICD-10-CM | POA: Diagnosis not present

## 2014-03-07 DIAGNOSIS — H04129 Dry eye syndrome of unspecified lacrimal gland: Secondary | ICD-10-CM | POA: Diagnosis not present

## 2014-03-08 ENCOUNTER — Encounter (HOSPITAL_COMMUNITY)
Admission: RE | Admit: 2014-03-08 | Discharge: 2014-03-08 | Disposition: A | Discharge status: Home / Self Care | Payer: Medicare Other | Source: Ambulatory Visit | Attending: Internal Medicine | Admitting: Internal Medicine

## 2014-03-08 DIAGNOSIS — I219 Acute myocardial infarction, unspecified: Secondary | ICD-10-CM | POA: Diagnosis not present

## 2014-03-11 ENCOUNTER — Encounter (HOSPITAL_COMMUNITY)
Admission: RE | Admit: 2014-03-11 | Discharge: 2014-03-11 | Disposition: A | Discharge status: Home / Self Care | Payer: Medicare Other | Source: Ambulatory Visit | Attending: Internal Medicine | Admitting: Internal Medicine

## 2014-03-11 DIAGNOSIS — I219 Acute myocardial infarction, unspecified: Secondary | ICD-10-CM | POA: Diagnosis not present

## 2014-03-13 ENCOUNTER — Encounter (HOSPITAL_COMMUNITY)
Admission: RE | Admit: 2014-03-13 | Discharge: 2014-03-13 | Disposition: A | Discharge status: Home / Self Care | Payer: Medicare Other | Source: Ambulatory Visit | Attending: Internal Medicine | Admitting: Internal Medicine

## 2014-03-13 DIAGNOSIS — I219 Acute myocardial infarction, unspecified: Secondary | ICD-10-CM | POA: Diagnosis not present

## 2014-03-15 ENCOUNTER — Encounter (HOSPITAL_COMMUNITY)
Admission: RE | Admit: 2014-03-15 | Discharge: 2014-03-15 | Disposition: A | Discharge status: Home / Self Care | Payer: Medicare Other | Source: Ambulatory Visit | Attending: Rheumatology | Admitting: Rheumatology

## 2014-03-15 DIAGNOSIS — I2510 Atherosclerotic heart disease of native coronary artery without angina pectoris: Secondary | ICD-10-CM | POA: Insufficient documentation

## 2014-03-15 DIAGNOSIS — I219 Acute myocardial infarction, unspecified: Secondary | ICD-10-CM | POA: Diagnosis not present

## 2014-03-15 DIAGNOSIS — Z9861 Coronary angioplasty status: Secondary | ICD-10-CM | POA: Diagnosis not present

## 2014-03-15 DIAGNOSIS — M069 Rheumatoid arthritis, unspecified: Secondary | ICD-10-CM | POA: Insufficient documentation

## 2014-03-15 DIAGNOSIS — Z5189 Encounter for other specified aftercare: Secondary | ICD-10-CM | POA: Insufficient documentation

## 2014-03-18 ENCOUNTER — Encounter (HOSPITAL_COMMUNITY): Payer: Medicare Other

## 2014-03-20 ENCOUNTER — Encounter (HOSPITAL_COMMUNITY): Payer: Medicare Other

## 2014-03-22 ENCOUNTER — Encounter (HOSPITAL_COMMUNITY)
Admission: RE | Admit: 2014-03-22 | Discharge: 2014-03-22 | Disposition: A | Discharge status: Home / Self Care | Payer: Medicare Other | Source: Ambulatory Visit | Attending: Internal Medicine | Admitting: Internal Medicine

## 2014-03-22 DIAGNOSIS — Z5189 Encounter for other specified aftercare: Secondary | ICD-10-CM | POA: Diagnosis not present

## 2014-03-25 ENCOUNTER — Encounter (HOSPITAL_COMMUNITY)
Admission: RE | Admit: 2014-03-25 | Discharge: 2014-03-25 | Disposition: A | Discharge status: Home / Self Care | Payer: Medicare Other | Source: Ambulatory Visit | Attending: Internal Medicine | Admitting: Internal Medicine

## 2014-03-25 DIAGNOSIS — Z5189 Encounter for other specified aftercare: Secondary | ICD-10-CM | POA: Diagnosis not present

## 2014-03-27 ENCOUNTER — Encounter (HOSPITAL_COMMUNITY)
Admission: RE | Admit: 2014-03-27 | Discharge: 2014-03-27 | Disposition: A | Discharge status: Home / Self Care | Payer: Medicare Other | Source: Ambulatory Visit | Attending: Internal Medicine | Admitting: Internal Medicine

## 2014-03-27 DIAGNOSIS — Z5189 Encounter for other specified aftercare: Secondary | ICD-10-CM | POA: Diagnosis not present

## 2014-03-29 ENCOUNTER — Encounter (HOSPITAL_COMMUNITY)
Admission: RE | Admit: 2014-03-29 | Discharge: 2014-03-29 | Disposition: A | Discharge status: Home / Self Care | Payer: Medicare Other | Source: Ambulatory Visit | Attending: Internal Medicine | Admitting: Internal Medicine

## 2014-03-29 DIAGNOSIS — M069 Rheumatoid arthritis, unspecified: Secondary | ICD-10-CM | POA: Diagnosis not present

## 2014-03-29 DIAGNOSIS — Z5189 Encounter for other specified aftercare: Secondary | ICD-10-CM | POA: Diagnosis not present

## 2014-04-01 ENCOUNTER — Encounter (HOSPITAL_COMMUNITY): Admission: RE | Admit: 2014-04-01 | Payer: Medicare Other | Source: Ambulatory Visit

## 2014-04-03 ENCOUNTER — Encounter (HOSPITAL_COMMUNITY)
Admission: RE | Admit: 2014-04-03 | Discharge: 2014-04-03 | Disposition: A | Discharge status: Home / Self Care | Payer: Medicare Other | Source: Ambulatory Visit | Attending: Internal Medicine | Admitting: Internal Medicine

## 2014-04-03 DIAGNOSIS — Z5189 Encounter for other specified aftercare: Secondary | ICD-10-CM | POA: Diagnosis not present

## 2014-04-05 ENCOUNTER — Encounter (HOSPITAL_COMMUNITY)
Admission: RE | Admit: 2014-04-05 | Discharge: 2014-04-05 | Disposition: A | Discharge status: Home / Self Care | Payer: Medicare Other | Source: Ambulatory Visit | Attending: Internal Medicine | Admitting: Internal Medicine

## 2014-04-05 DIAGNOSIS — Z5189 Encounter for other specified aftercare: Secondary | ICD-10-CM | POA: Diagnosis not present

## 2014-04-09 ENCOUNTER — Telehealth: Payer: Self-pay | Admitting: Internal Medicine

## 2014-04-09 NOTE — Telephone Encounter (Signed)
No need for antibiotic  Patient aware

## 2014-04-09 NOTE — Telephone Encounter (Signed)
New message     Have stents.  Going to dentist tomorrow for a routine cleaning--will he need an antibiotic?

## 2014-04-10 ENCOUNTER — Encounter (HOSPITAL_COMMUNITY)
Admission: RE | Admit: 2014-04-10 | Discharge: 2014-04-10 | Disposition: A | Discharge status: Home / Self Care | Payer: Medicare Other | Source: Ambulatory Visit | Attending: Internal Medicine | Admitting: Internal Medicine

## 2014-04-10 DIAGNOSIS — Z5189 Encounter for other specified aftercare: Secondary | ICD-10-CM | POA: Diagnosis not present

## 2014-04-12 ENCOUNTER — Encounter (HOSPITAL_COMMUNITY)
Admission: RE | Admit: 2014-04-12 | Discharge: 2014-04-12 | Disposition: A | Discharge status: Home / Self Care | Payer: Medicare Other | Source: Ambulatory Visit | Attending: Internal Medicine | Admitting: Internal Medicine

## 2014-04-12 DIAGNOSIS — Z5189 Encounter for other specified aftercare: Secondary | ICD-10-CM | POA: Diagnosis not present

## 2014-04-15 ENCOUNTER — Encounter (HOSPITAL_COMMUNITY)
Admission: RE | Admit: 2014-04-15 | Discharge: 2014-04-15 | Disposition: A | Discharge status: Home / Self Care | Payer: Medicare Other | Source: Ambulatory Visit | Attending: Rheumatology | Admitting: Rheumatology

## 2014-04-15 DIAGNOSIS — Z5189 Encounter for other specified aftercare: Secondary | ICD-10-CM | POA: Insufficient documentation

## 2014-04-15 DIAGNOSIS — Z9861 Coronary angioplasty status: Secondary | ICD-10-CM | POA: Diagnosis not present

## 2014-04-15 DIAGNOSIS — I219 Acute myocardial infarction, unspecified: Secondary | ICD-10-CM | POA: Insufficient documentation

## 2014-04-15 DIAGNOSIS — M069 Rheumatoid arthritis, unspecified: Secondary | ICD-10-CM | POA: Diagnosis not present

## 2014-04-17 ENCOUNTER — Encounter (HOSPITAL_COMMUNITY)
Admission: RE | Admit: 2014-04-17 | Discharge: 2014-04-17 | Disposition: A | Discharge status: Home / Self Care | Payer: Medicare Other | Source: Ambulatory Visit | Attending: Internal Medicine | Admitting: Internal Medicine

## 2014-04-17 DIAGNOSIS — Z5189 Encounter for other specified aftercare: Secondary | ICD-10-CM | POA: Diagnosis not present

## 2014-04-19 ENCOUNTER — Encounter (HOSPITAL_COMMUNITY)
Admission: RE | Admit: 2014-04-19 | Discharge: 2014-04-19 | Disposition: A | Discharge status: Home / Self Care | Payer: Medicare Other | Source: Ambulatory Visit | Attending: Internal Medicine | Admitting: Internal Medicine

## 2014-04-19 ENCOUNTER — Encounter (HOSPITAL_COMMUNITY): Payer: Self-pay

## 2014-04-19 DIAGNOSIS — Z5189 Encounter for other specified aftercare: Secondary | ICD-10-CM | POA: Diagnosis not present

## 2014-04-19 NOTE — Progress Notes (Signed)
Pt graduated from cardiac rehab program today.  Medication list reconciled.  PHQ9 score-  0.  Pt reports he initially had some depressive type symptoms after his MI however these have resolved.  Pt is  Currently being treated with antidepressant for significant situational stress that is also now resolved.  This is managed by his PCP.    Pt has made significant lifestyle changes and should be commended for his success. Pt plans [place] [facility] [place] [employer].  Pt feels he has met his short and long term goals of exercising without symptoms and getting back to normal.

## 2014-04-22 ENCOUNTER — Encounter (HOSPITAL_COMMUNITY): Payer: Medicare Other

## 2014-04-24 ENCOUNTER — Encounter (HOSPITAL_COMMUNITY): Payer: Medicare Other

## 2014-04-26 ENCOUNTER — Encounter (HOSPITAL_COMMUNITY): Payer: Medicare Other

## 2014-04-26 ENCOUNTER — Encounter: Payer: Self-pay | Admitting: Internal Medicine

## 2014-04-26 ENCOUNTER — Ambulatory Visit (INDEPENDENT_AMBULATORY_CARE_PROVIDER_SITE_OTHER): Payer: Medicare Other | Admitting: Internal Medicine

## 2014-04-26 VITALS — BP 120/70 | HR 76 | Temp 98.3°F | Resp 16 | Wt 178.0 lb

## 2014-04-26 DIAGNOSIS — L570 Actinic keratosis: Secondary | ICD-10-CM | POA: Insufficient documentation

## 2014-04-26 DIAGNOSIS — I2510 Atherosclerotic heart disease of native coronary artery without angina pectoris: Secondary | ICD-10-CM | POA: Diagnosis not present

## 2014-04-26 DIAGNOSIS — I213 ST elevation (STEMI) myocardial infarction of unspecified site: Secondary | ICD-10-CM

## 2014-04-26 DIAGNOSIS — I219 Acute myocardial infarction, unspecified: Secondary | ICD-10-CM | POA: Diagnosis not present

## 2014-04-26 DIAGNOSIS — M069 Rheumatoid arthritis, unspecified: Secondary | ICD-10-CM | POA: Diagnosis not present

## 2014-04-26 NOTE — Progress Notes (Signed)
Pre visit review using our clinic review tool, if applicable. No additional management support is needed unless otherwise documented below in the visit note. 

## 2014-04-26 NOTE — Assessment & Plan Note (Addendum)
Continue with current prescription therapy as reflected on the Med list. Card rehab

## 2014-04-26 NOTE — Progress Notes (Signed)
Subjective:    HPI F/u recent MI 3 wks ago F/u family stress - better: pain in the back is worse; sleep - better... The patient presents for a follow-up of a new hypertension detected, chronic dyslipidemia, RA controlled partially with medicines. He was recently checked again by Dr Carloyn Manner for severe neck OA, using neck traction now - better. Hydrocodone is not working too well. Oxycodone did work well. .   F/u fatigue, RA pains - worse due to stress  He is on arensia now - not working (remicade stopped working as well). Dr Ouida Sills is treating RA   Wt Readings from Last 3 Encounters:  04/26/14 178 lb (80.74 kg)  02/25/14 180 lb 9.6 oz (81.92 kg)  02/07/14 183 lb (83.008 kg)   BP Readings from Last 3 Encounters:  04/26/14 120/70  02/25/14 140/79  01/31/14 130/62     Review of Systems  Constitutional: Negative for appetite change, fatigue and unexpected weight change.  HENT: Negative for congestion, nosebleeds, sneezing, sore throat and trouble swallowing.   Eyes: Negative for itching and visual disturbance.  Respiratory: Negative for cough and shortness of breath.   Cardiovascular: Negative for chest pain, palpitations and leg swelling.  Gastrointestinal: Negative for nausea, diarrhea, blood in stool and abdominal distention.  Genitourinary: Negative for frequency and hematuria.  Musculoskeletal: Positive for arthralgias. Negative for back pain, gait problem, joint swelling and neck pain.  Skin: Negative for rash.  Neurological: Negative for dizziness, tremors, speech difficulty and weakness.  Psychiatric/Behavioral: Positive for sleep disturbance. Negative for suicidal ideas, dysphoric mood and agitation. The patient is nervous/anxious.        Objective:   Physical Exam  Constitutional: He is oriented to person, place, and time. He appears well-developed.  HENT:  Mouth/Throat: Oropharynx is clear and moist.  Eyes: Conjunctivae are normal. Pupils are equal, round, and  reactive to light.  Neck: Normal range of motion. No JVD present. No thyromegaly present.  Cardiovascular: Normal rate, regular rhythm, normal heart sounds and intact distal pulses.  Exam reveals no gallop and no friction rub.   No murmur heard. Pulmonary/Chest: Effort normal and breath sounds normal. No respiratory distress. He has no wheezes. He has no rales. He exhibits no tenderness.  Abdominal: Soft. Bowel sounds are normal. He exhibits no distension and no mass. There is no tenderness. There is no rebound and no guarding.  Musculoskeletal: Normal range of motion. He exhibits tenderness. He exhibits no edema.  Lymphadenopathy:    He has no cervical adenopathy.  Neurological: He is alert and oriented to person, place, and time. He has normal reflexes. No cranial nerve deficit. He exhibits normal muscle tone. Coordination normal.  Skin: Skin is warm and dry. No rash noted.  Psychiatric: He has a normal mood and affect. His behavior is normal. Judgment and thought content normal.  LS sensitive w/ROM AK on L forarm  Lab Results  Component Value Date   WBC 6.6 01/09/2014   HGB 12.5* 01/09/2014   HCT 35.7* 01/09/2014   PLT 240 01/09/2014   GLUCOSE 108* 02/25/2014   CHOL 180 02/06/2014   TRIG 126.0 02/06/2014   HDL 52.80 02/06/2014   LDLDIRECT 168.9 05/24/2013   LDLCALC 102* 02/06/2014   ALT 30 02/06/2014   AST 23 02/06/2014   NA 139 02/25/2014   K 3.6 02/25/2014   CL 103 02/25/2014   CREATININE 0.9 02/25/2014   BUN 18 02/25/2014   CO2 27 02/25/2014   TSH 0.737 01/08/2014   PSA  1.52 06/04/2011   INR 0.85 01/08/2014   HGBA1C 6.2* 01/08/2014    Procedure Note :     Procedure : Cryosurgery   Indication:  Wart(s)  Actinic keratosis(es)   Risks including unsuccessful procedure , bleeding, infection, bruising, scar, a need for a repeat  procedure and others were explained to the patient in detail as well as the benefits. Informed consent was obtained verbally.    1 lesion(s)  on  L forearm   was/were treated with liquid nitrogen on a Q-tip in a usual fasion . Band-Aid was applied and antibiotic ointment was given for a later use.   Tolerated well. Complications none.   Postprocedure instructions :     Keep the wounds clean. You can wash them with liquid soap and water. Pat dry with gauze or a Kleenex tissue  Before applying antibiotic ointment and a Band-Aid.   You need to report immediately  if  any signs of infection develop.           Assessment & Plan:

## 2014-04-26 NOTE — Patient Instructions (Signed)
   Postprocedure instructions :     Keep the wounds clean. You can wash them with liquid soap and water. Pat dry with gauze or a Kleenex tissue  Before applying antibiotic ointment and a Band-Aid.   You need to report immediately  if  any signs of infection develop.    

## 2014-04-26 NOTE — Assessment & Plan Note (Signed)
See procedure 

## 2014-04-26 NOTE — Assessment & Plan Note (Signed)
In cardiac rehab - doing well

## 2014-04-29 ENCOUNTER — Encounter (HOSPITAL_COMMUNITY): Payer: Medicare Other

## 2014-05-01 ENCOUNTER — Encounter (HOSPITAL_COMMUNITY): Payer: Medicare Other

## 2014-05-02 ENCOUNTER — Ambulatory Visit: Payer: Medicare Other | Admitting: Internal Medicine

## 2014-05-03 ENCOUNTER — Encounter (HOSPITAL_COMMUNITY): Payer: Medicare Other

## 2014-05-06 ENCOUNTER — Encounter (HOSPITAL_COMMUNITY): Payer: Medicare Other

## 2014-05-08 ENCOUNTER — Encounter (HOSPITAL_COMMUNITY): Payer: Medicare Other

## 2014-05-10 ENCOUNTER — Encounter (HOSPITAL_COMMUNITY): Payer: Medicare Other

## 2014-05-13 ENCOUNTER — Telehealth: Payer: Self-pay | Admitting: *Deleted

## 2014-05-13 MED ORDER — LORAZEPAM 0.5 MG PO TABS: 0.5000 mg | ORAL_TABLET | Freq: Two times a day (BID) | ORAL | Status: DC | PRN

## 2014-05-13 MED ORDER — ZOLPIDEM TARTRATE 10 MG PO TABS: 10.0000 mg | ORAL_TABLET | Freq: Every evening | ORAL | Status: DC | PRN

## 2014-05-13 NOTE — Telephone Encounter (Signed)
Rf req for Zolpidem and Lorazepam to [facility]. Ok to Rf?

## 2014-05-13 NOTE — Telephone Encounter (Signed)
Done

## 2014-05-13 NOTE — Telephone Encounter (Signed)
OK to fill both with additional refills x3 Thank you!  

## 2014-05-24 DIAGNOSIS — M069 Rheumatoid arthritis, unspecified: Secondary | ICD-10-CM | POA: Diagnosis not present

## 2014-05-27 ENCOUNTER — Other Ambulatory Visit (INDEPENDENT_AMBULATORY_CARE_PROVIDER_SITE_OTHER): Payer: Medicare Other

## 2014-05-27 ENCOUNTER — Other Ambulatory Visit (HOSPITAL_COMMUNITY): Payer: Self-pay | Admitting: Cardiology

## 2014-05-27 DIAGNOSIS — E782 Mixed hyperlipidemia: Secondary | ICD-10-CM

## 2014-05-27 DIAGNOSIS — Z79899 Other long term (current) drug therapy: Secondary | ICD-10-CM | POA: Diagnosis not present

## 2014-05-27 LAB — HEPATIC FUNCTION PANEL
ALT: 35 U/L (ref 0–53)
AST: 29 U/L (ref 0–37)
Albumin: 3.8 g/dL (ref 3.5–5.2)
Alkaline Phosphatase: 46 U/L (ref 39–117)
Bilirubin, Direct: 0 mg/dL (ref 0.0–0.3)
Total Bilirubin: 0.7 mg/dL (ref 0.2–1.2)
Total Protein: 6.9 g/dL (ref 6.0–8.3)

## 2014-05-27 LAB — LIPID PANEL
Cholesterol: 127 mg/dL (ref 0–200)
HDL: 55.5 mg/dL (ref >39.00)
LDL Cholesterol: 50 mg/dL (ref 0–99)
NonHDL: 71.5
Total CHOL/HDL Ratio: 2
Triglycerides: 108 mg/dL (ref 0.0–149.0)
VLDL: 21.6 mg/dL (ref 0.0–40.0)

## 2014-05-28 ENCOUNTER — Ambulatory Visit (INDEPENDENT_AMBULATORY_CARE_PROVIDER_SITE_OTHER): Payer: Medicare Other | Admitting: Pharmacist

## 2014-05-28 VITALS — Wt 179.0 lb

## 2014-05-28 DIAGNOSIS — E782 Mixed hyperlipidemia: Secondary | ICD-10-CM | POA: Diagnosis not present

## 2014-05-28 DIAGNOSIS — I2510 Atherosclerotic heart disease of native coronary artery without angina pectoris: Secondary | ICD-10-CM | POA: Diagnosis not present

## 2014-05-28 DIAGNOSIS — Z79899 Other long term (current) drug therapy: Secondary | ICD-10-CM

## 2014-05-28 NOTE — Patient Instructions (Signed)
1.  Continue Crestor 10 mg once daily and Zetia 1/2 tablet once daily.  2.  Recheck blood work in 3 months (08/26/14 - fasting labs), and see [name] the next day (08/27/14 at 9:30 am)

## 2014-05-28 NOTE — Progress Notes (Signed)
Patient is a pleasant 80 y.o WM referred to lipid clinic by Dr. Rayann Heman following an MI in 12/2013 and inability to tolerated lipid lowering medications historically.  I currently gave him on Crestor 10 mg and Zetia 5 mg daily, and he is tolerating well.  His LDL has dropped down to 50 mg/dL on this combination.  He has a h/o rheumatoid arthritis which he is being treated for.  Patient is seeing a dietician currently and has lost 5-8 lbs over past 3 months from cutting out fast food, reducing portion sizes, and eating less meat.  He finished cardiac rehab, and plans on starting into the maintenance program with the hospital now.    RF:  MI (12/2013), HTN, age - LDL goal < 70 Meds:  Crestor 10 mg qd, Zetia 5 mg qd Intolerant:  Crestor in the past, Lipitor, pravastatin (muscle aches). Patient thinks he may have tried Zetia in the past but not sure.  Social history:  Drinks 1-2 drinks per night (typically bourbon), former smoker, retired. Diet:  This has continued to improve over past 3 months, with eating out less, and eating smaller portion sizes.  Eats fish a few times per week, and rarely eats red meat.  Now, breakfast:  High fiber cereal with fruit.  Lunch:  Sandwich and salad typically.  Dinner:  Occasional meat and vegetable typically.   Now eating a lot more fish, in particular sardines and salmon.  Stopped eating chips and fried foods, and eating a lot more veggies now.  Doesn't drink soda.  Eats out 2-3 times per week for lunch or dinner. Exercise:  Has been walking daily, but is about to sign up for maintenance program with cardiac rehab.  Labs: 05/2014  LDL 50, HDL 56, TC 127, TG 108, non-HDL 72, LFTs normal (Crestor 10 mg qd and Zetia 5 mg qd)   01/2014:  LDL 102, HDL 53, TC 180, TG 126, LFTs normal (Crestor 10 mg qd x 4 weeks) 12/2013:  LDL 157, HDL 50, TC 236, TG 144 (not on meds.  At time of MI)  Current Outpatient Prescriptions  Medication Sig Dispense Refill  . Abatacept (ORENCIA [place])  Inject into the skin every 30 (thirty) days.      [name] aspirin 81 MG tablet Take 81 mg by mouth daily.      . Cholecalciferol (VITAMIN D) 1000 UNITS capsule Take 1,000 Units by mouth daily.       . CRESTOR 10 MG tablet TAKE ONE TABLET EACH DAY  30 tablet  0  . Cyanocobalamin 2500 MCG SUBL Place 1 tablet under the tongue daily.       . DULoxetine (CYMBALTA) 60 MG capsule Take 1 capsule (60 mg total) by mouth daily.  30 capsule  11  . ezetimibe (ZETIA) 10 MG tablet Take 5 mg by mouth daily. Pt takes 1/2 tab daily      . lisinopril (PRINIVIL,ZESTRIL) 5 MG tablet Take 1 tablet (5 mg total) by mouth daily.  90 tablet  3  . LORazepam (ATIVAN) 0.5 MG tablet Take 1 tablet (0.5 mg total) by mouth 2 (two) times daily as needed for anxiety.  60 tablet  3  . metoprolol tartrate (LOPRESSOR) 25 MG tablet Take 1 tablet (25 mg total) by mouth 2 (two) times daily.  60 tablet  4  . nitroGLYCERIN (NITROSTAT) 0.4 MG SL tablet Place 1 tablet (0.4 mg total) under the tongue every 5 (five) minutes x 3 doses as needed for chest pain.  30 tablet  12  . omeprazole (PRILOSEC) 40 MG capsule Take 40 mg by mouth daily.      [name] oxyCODONE-acetaminophen (ROXICET) 5-325 MG per tablet Take 1 tablet by mouth every 6 (six) hours as needed.  120 tablet  0  . predniSONE (DELTASONE) 5 MG tablet Take 5 mg by mouth daily with breakfast.      . Propylene Glycol (SYSTANE BALANCE OP) Apply 1 drop to eye as needed (dry eye).      . Ticagrelor (BRILINTA) 90 MG TABS tablet Take 1 tablet (90 mg total) by mouth 2 (two) times daily.  60 tablet  12  . zolpidem (AMBIEN) 10 MG tablet Take 1 tablet (10 mg total) by mouth at bedtime as needed for sleep.  90 tablet  3   No current facility-administered medications for this visit.   Family History  Problem Relation Age of Onset  . Colon cancer Neg Hx   . Hypertension Mother   . Heart disease Father    Allergies  Allergen Reactions  . Methotrexate Other (See Comments)    REACTION: tachycardia  .  Atorvastatin Other (See Comments)    Muscle pain, leg cramps  . Pravastatin Sodium Other (See Comments)    REACTION: cramps, fatigue

## 2014-05-28 NOTE — Assessment & Plan Note (Addendum)
Excellent LDL and non-HDL improvement with Zetia 5 mg qd and Crestor 10 mg qd.  Tolerating medications well, and has reached cholesterol goals.  He is eating better and continues to live an active lifestyle. No changes needed.  Patient has become apprehensive since his MI, and would like to come back in see me in 3 months following lab work again.  This has been scheduled. Plan: 1.  Continue Crestor 10 mg once daily and Zetia 1/2 tablet once daily.  2.  Continue low fat diet and exercise. 3.  Recheck blood work in 3 months (08/26/14 - fasting labs), and see [name] the next day (08/27/14 at 9:30 am)

## 2014-06-10 ENCOUNTER — Other Ambulatory Visit: Payer: Self-pay | Admitting: Internal Medicine

## 2014-06-24 DIAGNOSIS — M069 Rheumatoid arthritis, unspecified: Secondary | ICD-10-CM | POA: Diagnosis not present

## 2014-06-27 DIAGNOSIS — L821 Other seborrheic keratosis: Secondary | ICD-10-CM | POA: Diagnosis not present

## 2014-06-27 DIAGNOSIS — L570 Actinic keratosis: Secondary | ICD-10-CM | POA: Diagnosis not present

## 2014-06-27 DIAGNOSIS — D1801 Hemangioma of skin and subcutaneous tissue: Secondary | ICD-10-CM | POA: Diagnosis not present

## 2014-06-27 DIAGNOSIS — L819 Disorder of pigmentation, unspecified: Secondary | ICD-10-CM | POA: Diagnosis not present

## 2014-06-27 DIAGNOSIS — Z85828 Personal history of other malignant neoplasm of skin: Secondary | ICD-10-CM | POA: Diagnosis not present

## 2014-07-01 DIAGNOSIS — M069 Rheumatoid arthritis, unspecified: Secondary | ICD-10-CM | POA: Diagnosis not present

## 2014-07-08 ENCOUNTER — Other Ambulatory Visit (HOSPITAL_COMMUNITY): Payer: Self-pay | Admitting: Internal Medicine

## 2014-07-17 ENCOUNTER — Encounter: Payer: Self-pay | Admitting: Internal Medicine

## 2014-07-17 ENCOUNTER — Ambulatory Visit (INDEPENDENT_AMBULATORY_CARE_PROVIDER_SITE_OTHER): Payer: Medicare Other | Admitting: Internal Medicine

## 2014-07-17 VITALS — BP 130/72 | HR 65 | Ht 71.5 in | Wt 176.1 lb

## 2014-07-17 DIAGNOSIS — R002 Palpitations: Secondary | ICD-10-CM

## 2014-07-17 DIAGNOSIS — E782 Mixed hyperlipidemia: Secondary | ICD-10-CM | POA: Diagnosis not present

## 2014-07-17 DIAGNOSIS — I2510 Atherosclerotic heart disease of native coronary artery without angina pectoris: Secondary | ICD-10-CM | POA: Diagnosis not present

## 2014-07-17 DIAGNOSIS — I10 Essential (primary) hypertension: Secondary | ICD-10-CM

## 2014-07-17 NOTE — Progress Notes (Signed)
PCP: [name], MD  [name] is a 80 y.o. male who presents today for routine cardiology followup.  Since last being seen, the patient reports doing very well.  Today, he denies symptoms of palpitations, chest pain, shortness of breath,  lower extremity edema, dizziness, presyncope, or syncope.  The patient is otherwise without complaint today.   Past Medical History  Diagnosis Date  . Colon polyp   . Hyperlipidemia   . Osteoarthritis   . Migraine with aura   . RA (rheumatoid arthritis)   . Premature atrial contractions   . GERD (gastroesophageal reflux disease)   . Hypertension   . CAD (coronary artery disease) 12/2013    s/p inferior STEMI 01/08/2014; LHC 01/08/14: total RCA occlusion s/p 3.5x21mm Xience DES distal RCA and 3.5x28 mm DES mid RCA, 60-70% mid LAD stenosis, EF 55%  . Palpitations   . STEMI (ST elevation myocardial infarction)    Past Surgical History  Procedure Laterality Date  . Back surgery  2004    Current Outpatient Prescriptions  Medication Sig Dispense Refill  . Abatacept (ORENCIA [place]) Inject into the skin every 30 (thirty) days.      [name] aspirin 81 MG tablet Take 81 mg by mouth daily.      . Cholecalciferol (VITAMIN D) 1000 UNITS capsule Take 1,000 Units by mouth daily.       . CRESTOR 10 MG tablet TAKE ONE TABLET EACH DAY  30 tablet  5  . Cyanocobalamin 2500 MCG SUBL Place 1 tablet under the tongue daily.       . DULoxetine (CYMBALTA) 60 MG capsule Take 1 capsule (60 mg total) by mouth daily.  30 capsule  11  . ezetimibe (ZETIA) 10 MG tablet Take 5 mg by mouth daily.       [name] lisinopril (PRINIVIL,ZESTRIL) 5 MG tablet Take 1 tablet (5 mg total) by mouth daily.  90 tablet  3  . LORazepam (ATIVAN) 0.5 MG tablet Take 1 tablet (0.5 mg total) by mouth 2 (two) times daily as needed for anxiety.  60 tablet  3  . metoprolol tartrate (LOPRESSOR) 25 MG tablet TAKE ONE TABLET TWICE DAILY  60 tablet  1  . nitroGLYCERIN (NITROSTAT) 0.4 MG SL tablet Place 1 tablet (0.4  mg total) under the tongue every 5 (five) minutes x 3 doses as needed for chest pain.  30 tablet  12  . omeprazole (PRILOSEC) 40 MG capsule Take 40 mg by mouth daily.      [name] oxyCODONE-acetaminophen (PERCOCET/ROXICET) 5-325 MG per tablet Take 1 tablet by mouth every 6 (six) hours as needed for severe pain.      . predniSONE (DELTASONE) 5 MG tablet Take 5 mg by mouth daily with breakfast.      . Propylene Glycol (SYSTANE BALANCE OP) Apply 1 drop to eye as needed (dry eye).      . Ticagrelor (BRILINTA) 90 MG TABS tablet Take 1 tablet (90 mg total) by mouth 2 (two) times daily.  60 tablet  12  . zolpidem (AMBIEN) 10 MG tablet Take 5 mg by mouth at bedtime as needed for sleep.       No current facility-administered medications for this visit.    Physical Exam: Filed Vitals:   07/17/14 1100  BP: 130/72  Pulse: 65  Height: 5' 11.5" (1.816 m)  Weight: 176 lb 1.9 oz (79.888 kg)    GEN- The patient is well appearing, alert and oriented x 3 today.   Head- normocephalic, atraumatic  Eyes-  Sclera clear, conjunctiva pink Ears- hearing intact Oropharynx- clear Lungs- Clear to ausculation bilaterally, normal work of breathing Heart- Regular rate and rhythm, no murmurs, rubs or gallops, PMI not laterally displaced GI- soft, NT, ND, + BS Extremities- no clubbing, cyanosis, or edema  ekg today reveals sinus rhythm 76 bpm, otherwise normal ekg Echo is reviewed  Assessment and Plan:  1. Cad Doing well  No ischemic symptoms  2. htn Stable No change required today  3. HL Stable No change required today Followed in lipid clinic  4. pacs Well controlled  Return in 6 months

## 2014-07-17 NOTE — Patient Instructions (Signed)
Your physician recommends that you continue on your current medications as directed. Please refer to the Current Medication list given to you today.  Your physician wants you to follow-up in: 6 months with Dr. Allred.  You will receive a reminder letter in the mail two months in advance. If you don't receive a letter, please call our office to schedule the follow-up appointment.  

## 2014-07-25 ENCOUNTER — Telehealth: Payer: Self-pay | Admitting: Internal Medicine

## 2014-07-25 ENCOUNTER — Encounter: Payer: Self-pay | Admitting: Pharmacist

## 2014-07-25 NOTE — Telephone Encounter (Signed)
Patient wanted to know the best medication for him to take for headache or back ache.  Patient advised to avoid NSAIDs given h/o MI and on Brilinta + aspirin, and to use tylenol as needed for pain instead.  Patient agreeable to plan.

## 2014-07-25 NOTE — Telephone Encounter (Signed)
New message    For [name] Talk to Emmett regarding his medications.  He want to ask you a question.

## 2014-07-26 DIAGNOSIS — M069 Rheumatoid arthritis, unspecified: Secondary | ICD-10-CM | POA: Diagnosis not present

## 2014-08-05 ENCOUNTER — Other Ambulatory Visit: Payer: Self-pay | Admitting: Internal Medicine

## 2014-08-19 ENCOUNTER — Other Ambulatory Visit: Payer: Self-pay | Admitting: Dermatology

## 2014-08-19 DIAGNOSIS — D0422 Carcinoma in situ of skin of left ear and external auricular canal: Secondary | ICD-10-CM | POA: Diagnosis not present

## 2014-08-19 DIAGNOSIS — Z85828 Personal history of other malignant neoplasm of skin: Secondary | ICD-10-CM | POA: Diagnosis not present

## 2014-08-19 DIAGNOSIS — D485 Neoplasm of uncertain behavior of skin: Secondary | ICD-10-CM | POA: Diagnosis not present

## 2014-08-23 DIAGNOSIS — M0579 Rheumatoid arthritis with rheumatoid factor of multiple sites without organ or systems involvement: Secondary | ICD-10-CM | POA: Diagnosis not present

## 2014-08-26 ENCOUNTER — Other Ambulatory Visit (INDEPENDENT_AMBULATORY_CARE_PROVIDER_SITE_OTHER): Payer: Medicare Other | Admitting: *Deleted

## 2014-08-26 DIAGNOSIS — Z79899 Other long term (current) drug therapy: Secondary | ICD-10-CM

## 2014-08-26 DIAGNOSIS — E782 Mixed hyperlipidemia: Secondary | ICD-10-CM | POA: Diagnosis not present

## 2014-08-26 LAB — HEPATIC FUNCTION PANEL
ALT: 35 U/L (ref 0–53)
AST: 33 U/L (ref 0–37)
Albumin: 3.7 g/dL (ref 3.5–5.2)
Alkaline Phosphatase: 42 U/L (ref 39–117)
Bilirubin, Direct: 0.1 mg/dL (ref 0.0–0.3)
Total Bilirubin: 0.8 mg/dL (ref 0.2–1.2)
Total Protein: 7.5 g/dL (ref 6.0–8.3)

## 2014-08-26 LAB — LIPID PANEL
Cholesterol: 154 mg/dL (ref 0–200)
HDL: 56.1 mg/dL (ref >39.00)
LDL Cholesterol: 76 mg/dL (ref 0–99)
NonHDL: 97.9
Total CHOL/HDL Ratio: 3
Triglycerides: 108 mg/dL (ref 0.0–149.0)
VLDL: 21.6 mg/dL (ref 0.0–40.0)

## 2014-08-27 ENCOUNTER — Ambulatory Visit (INDEPENDENT_AMBULATORY_CARE_PROVIDER_SITE_OTHER): Payer: Medicare Other | Admitting: Pharmacist Clinician (PhC)/ Clinical Pharmacy Specialist

## 2014-08-27 DIAGNOSIS — E782 Mixed hyperlipidemia: Secondary | ICD-10-CM | POA: Diagnosis not present

## 2014-08-27 DIAGNOSIS — I2510 Atherosclerotic heart disease of native coronary artery without angina pectoris: Secondary | ICD-10-CM | POA: Diagnosis not present

## 2014-08-27 NOTE — Patient Instructions (Signed)
1.  Work on dietary changes - decrease cheese and egg consumption  2.  Try increasing Crestor to 20mg  daily - if not tolerated, go back to 10mg  daily  3.  Continue with exercise program  4.  Recheck cholesterol and liver function in 3 months (labs Jan 18, return visit on Jan 21)

## 2014-08-28 ENCOUNTER — Ambulatory Visit: Payer: Medicare Other | Admitting: Internal Medicine

## 2014-08-28 ENCOUNTER — Encounter: Payer: Self-pay | Admitting: Pharmacist Clinician (PhC)/ Clinical Pharmacy Specialist

## 2014-08-28 MED ORDER — ROSUVASTATIN CALCIUM 20 MG PO TABS: 20.0000 mg | ORAL_TABLET | Freq: Every day | ORAL | Status: DC

## 2014-08-28 NOTE — Progress Notes (Signed)
Patient is a pleasant 80 y.o WM referred to lipid clinic by Dr. Rayann Heman following an MI in 12/2013 and inability to tolerated lipid lowering medications historically.  He is currently on Crestor 10 mg and Zetia 5 mg daily, with no complaints.  He has noted occasional leg cramps/pains, but seems to note this when he has periods of inactivity.  If he stays active he doesn't notice any problems.  His LDL initially dropped down to 50 mg/dL on this combination, but his most recent lab has it back up to 76.  He has a h/o rheumatoid arthritis which he is being treated for. Patient and his wife have worked hard to eat healthy and reduce portion size, but they admit they probably haven't been as careful in the past month or two as they were previously. He has been exercising at a nearby gym 3 days each week since finishing Cardiac rehab.  He has had more problems with his rheumatoid arthritis recently, but feels good overall.    RF:  MI (12/2013), HTN, age - LDL goal < 70 Meds:  Crestor 10 mg qd, Zetia 5 mg qd Intolerant:  Crestor in the past, Lipitor, pravastatin (muscle aches).  Social history:  Drinks 1-2 drinks per night (typically bourbon), former smoker, retired. Diet:  This improved over the first several months after his MI, but admits to more cheeses in recent months. They are eating smaller portion sizes.  Eats fish a few times per week, and rarely eats red meat.  Now, breakfast:  High fiber cereal with fruit.  Lunch:  Sandwich and salad typically.  Dinner:  Occasional meat and vegetable typically.   Now eating a lot more fish, in particular sardines and salmon.  Stopped eating chips and fried foods, and eating a lot more veggies now.  Doesn't drink soda.  Eats out 2-3 times per week for lunch or dinner. Exercise:  Going to local gym three times per week - cardio and free weights, about 1 hour per visit.  Labs: 08/2014  LDL 76   HDL 56.1   TC 154  TG 108   nonHDL  97.9  LFT's normal on Crestor 10mg  and  Zetia 5mg ) 05/2014  LDL 50, HDL 56, TC 127, TG 108, non-HDL 72, LFTs normal (Crestor 10 mg qd and Zetia 5 mg qd)   01/2014:  LDL 102, HDL 53, TC 180, TG 126, LFTs normal (Crestor 10 mg qd x 4 weeks) 12/2013:  LDL 157, HDL 50, TC 236, TG 144 (not on meds.  At time of MI)  Current Outpatient Prescriptions  Medication Sig Dispense Refill  . Abatacept (ORENCIA [place]) Inject into the skin every 30 (thirty) days.      [name] aspirin 81 MG tablet Take 81 mg by mouth daily.      . Cholecalciferol (VITAMIN D) 1000 UNITS capsule Take 1,000 Units by mouth daily.       . CRESTOR 10 MG tablet TAKE ONE TABLET EACH DAY  30 tablet  5  . Cyanocobalamin 2500 MCG SUBL Place 1 tablet under the tongue daily.       . DULoxetine (CYMBALTA) 60 MG capsule Take 1 capsule (60 mg total) by mouth daily.  30 capsule  11  . ezetimibe (ZETIA) 10 MG tablet Take 5 mg by mouth daily.       [name] lisinopril (PRINIVIL,ZESTRIL) 5 MG tablet Take 1 tablet (5 mg total) by mouth daily.  90 tablet  3  . LORazepam (ATIVAN) 0.5 MG tablet  Take 1 tablet (0.5 mg total) by mouth 2 (two) times daily as needed for anxiety.  60 tablet  3  . metoprolol tartrate (LOPRESSOR) 25 MG tablet TAKE ONE TABLET TWICE DAILY  60 tablet  3  . nitroGLYCERIN (NITROSTAT) 0.4 MG SL tablet Place 1 tablet (0.4 mg total) under the tongue every 5 (five) minutes x 3 doses as needed for chest pain.  30 tablet  12  . omeprazole (PRILOSEC) 40 MG capsule Take 40 mg by mouth daily.      [name] oxyCODONE-acetaminophen (PERCOCET/ROXICET) 5-325 MG per tablet Take 1 tablet by mouth every 6 (six) hours as needed for severe pain.      . predniSONE (DELTASONE) 5 MG tablet Take 5 mg by mouth daily with breakfast.      . Propylene Glycol (SYSTANE BALANCE OP) Apply 1 drop to eye as needed (dry eye).      . Ticagrelor (BRILINTA) 90 MG TABS tablet Take 1 tablet (90 mg total) by mouth 2 (two) times daily.  60 tablet  12  . zolpidem (AMBIEN) 10 MG tablet Take 5 mg by mouth at bedtime as needed for sleep.        No current facility-administered medications for this visit.   Family History  Problem Relation Age of Onset  . Colon cancer Neg Hx   . Hypertension Mother   . Heart disease Father    Allergies  Allergen Reactions  . Methotrexate Other (See Comments)    REACTION: tachycardia  . Atorvastatin Other (See Comments)    Muscle pain, leg cramps  . Pravastatin Sodium Other (See Comments)    REACTION: cramps, fatigue

## 2014-08-28 NOTE — Assessment & Plan Note (Signed)
Will increase Crestor to 20mg .  Realize that patient has has problems with statin intolerances in the past, but willing to increase dose to get LDL back down to previous low.  He will start at 20mg  per day, but has been advised to cut back to 10mg  should any concerns develop.  Will continue on Zetia 5mg  daily.  He and his wife will also work on keeping their dietary goals.  He is to continue with his current exercise regimen.  Will repeat labs and see back in the office with his wife in 3 months.

## 2014-09-01 DIAGNOSIS — Z23 Encounter for immunization: Secondary | ICD-10-CM | POA: Diagnosis not present

## 2014-09-05 ENCOUNTER — Ambulatory Visit (INDEPENDENT_AMBULATORY_CARE_PROVIDER_SITE_OTHER): Payer: Medicare Other | Admitting: Family

## 2014-09-05 ENCOUNTER — Encounter: Payer: Self-pay | Admitting: Family

## 2014-09-05 ENCOUNTER — Telehealth: Payer: Self-pay | Admitting: Internal Medicine

## 2014-09-05 VITALS — BP 118/80 | HR 76 | Temp 97.9°F | Resp 18 | Ht 71.0 in | Wt 179.8 lb

## 2014-09-05 DIAGNOSIS — J069 Acute upper respiratory infection, unspecified: Secondary | ICD-10-CM | POA: Diagnosis not present

## 2014-09-05 DIAGNOSIS — I2510 Atherosclerotic heart disease of native coronary artery without angina pectoris: Secondary | ICD-10-CM | POA: Diagnosis not present

## 2014-09-05 DIAGNOSIS — M069 Rheumatoid arthritis, unspecified: Secondary | ICD-10-CM | POA: Diagnosis not present

## 2014-09-05 DIAGNOSIS — M4302 Spondylolysis, cervical region: Secondary | ICD-10-CM | POA: Diagnosis not present

## 2014-09-05 NOTE — Progress Notes (Signed)
Subjective:    Patient ID: [name], male    DOB: [birth year], 80 y.o.   MRN: [id number]  Chief Complaint  Patient presents with  . Nasal Congestion    x2 days, having some drainage and had trouble sleeping last night, doesn't know what to take with all the meds hes on due to heart attack in february    HPI:  [name] is a 80 y.o. male who presents today for nasal congestion.   Symptoms started about 2 days ago. Currently experiencing a nasal drainage which has progressed to his chest, occasional dry cough - sometimes productive with white sputum. Denies fever, chills, body aches, nausea, vomiting or diarrhea. The course of the symptoms have remained somewhat constant. There is nothing that currently makes it better or worse. He has a recent history of an MI in February and is concerned for what medications he is able to take.   Allergies  Allergen Reactions  . Methotrexate Other (See Comments)    REACTION: tachycardia  . Atorvastatin Other (See Comments)    Muscle pain, leg cramps  . Pravastatin Sodium Other (See Comments)    REACTION: cramps, fatigue   Current Outpatient Prescriptions on File Prior to Visit  Medication Sig Dispense Refill  . Abatacept (ORENCIA [place]) Inject into the skin every 30 (thirty) days.      [name] aspirin 81 MG tablet Take 81 mg by mouth daily.      . Cholecalciferol (VITAMIN D) 1000 UNITS capsule Take 1,000 Units by mouth daily.       . CRESTOR 10 MG tablet TAKE ONE TABLET EACH DAY  30 tablet  5  . Cyanocobalamin 2500 MCG SUBL Place 1 tablet under the tongue daily.       . DULoxetine (CYMBALTA) 60 MG capsule Take 1 capsule (60 mg total) by mouth daily.  30 capsule  11  . ezetimibe (ZETIA) 10 MG tablet Take 5 mg by mouth daily.       [name] lisinopril (PRINIVIL,ZESTRIL) 5 MG tablet Take 1 tablet (5 mg total) by mouth daily.  90 tablet  3  . LORazepam (ATIVAN) 0.5 MG tablet Take 1 tablet (0.5 mg total) by mouth 2 (two) times daily as needed for anxiety.  60  tablet  3  . metoprolol tartrate (LOPRESSOR) 25 MG tablet TAKE ONE TABLET TWICE DAILY  60 tablet  3  . nitroGLYCERIN (NITROSTAT) 0.4 MG SL tablet Place 1 tablet (0.4 mg total) under the tongue every 5 (five) minutes x 3 doses as needed for chest pain.  30 tablet  12  . omeprazole (PRILOSEC) 40 MG capsule Take 40 mg by mouth daily.      [name] oxyCODONE-acetaminophen (PERCOCET/ROXICET) 5-325 MG per tablet Take 1 tablet by mouth every 6 (six) hours as needed for severe pain.      . predniSONE (DELTASONE) 5 MG tablet Take 5 mg by mouth daily with breakfast.      . Propylene Glycol (SYSTANE BALANCE OP) Apply 1 drop to eye as needed (dry eye).      . rosuvastatin (CRESTOR) 20 MG tablet Take 1 tablet (20 mg total) by mouth daily.  90 tablet  3  . Ticagrelor (BRILINTA) 90 MG TABS tablet Take 1 tablet (90 mg total) by mouth 2 (two) times daily.  60 tablet  12  . zolpidem (AMBIEN) 10 MG tablet Take 5 mg by mouth at bedtime as needed for sleep.       No current facility-administered  medications on file prior to visit.      Review of Systems     See HPI Objective:    BP 118/80  Pulse 76  Temp(Src) 97.9 F (36.6 C) (Oral)  Resp 18  Ht 5\' 11"  (1.803 m)  Wt 179 lb 12.8 oz (81.557 kg)  BMI 25.09 kg/m2  SpO2 97% Nursing note and vital signs reviewed.  Physical Exam  Constitutional: He is oriented to person, place, and time. He appears well-developed and well-nourished. No distress.  HENT:  Right Ear: Tympanic membrane, external ear and ear canal normal. Decreased hearing is noted.  Left Ear: Tympanic membrane, external ear and ear canal normal. Decreased hearing is noted.  Nose: Right sinus exhibits no maxillary sinus tenderness and no frontal sinus tenderness. Left sinus exhibits no maxillary sinus tenderness and no frontal sinus tenderness.  Mouth/Throat: Uvula is midline, oropharynx is clear and moist and mucous membranes are normal.  Hearing aids present bilaterally - decreased hearing is not  new.   Cardiovascular: Normal rate, regular rhythm and normal heart sounds.   Pulmonary/Chest: Effort normal and breath sounds normal.  Neurological: He is alert and oriented to person, place, and time.  Skin: Skin is warm and dry.  Psychiatric: He has a normal mood and affect. His behavior is normal. Judgment and thought content normal.       Assessment & Plan:

## 2014-09-05 NOTE — Progress Notes (Signed)
Pre visit review using our clinic review tool, if applicable. No additional management support is needed unless otherwise documented below in the visit note. 

## 2014-09-05 NOTE — Assessment & Plan Note (Signed)
Current symptoms and vital signs support an URI. Continue supportive care. Discussed safe medications such as Flonase, Zyrtec and Coricidin HBP. Instructed to stay away from any products that contain psuedophedrine or -D. Instructed to follow up if symptoms worsen.

## 2014-09-05 NOTE — Patient Instructions (Signed)
Thank you for choosing [facility].  Summary/Instructions:   Your symptoms are consistent with an upper respiratory infection.   Please continue over the counter medications for symptom relief. You may use flonase and coricidin HBP for congestion. An antihistamine such as Zyrtec, Allegra or Claritin for congestion (DO NOT USE ANYTHING WITH THE -D or Psuedophedrine).  If your symptoms worsen or fail to improve, please contact the office.   Upper Respiratory Infection, Adult An upper respiratory infection (URI) is also sometimes known as the common cold. The upper respiratory tract includes the nose, sinuses, throat, trachea, and bronchi. Bronchi are the airways leading to the lungs. Most people improve within 1 week, but symptoms can last up to 2 weeks. A residual cough may last even longer.  CAUSES Many different viruses can infect the tissues lining the upper respiratory tract. The tissues become irritated and inflamed and often become very moist. Mucus production is also common. A cold is contagious. You can easily spread the virus to others by oral contact. This includes kissing, sharing a glass, coughing, or sneezing. Touching your mouth or nose and then touching a surface, which is then touched by another person, can also spread the virus. SYMPTOMS  Symptoms typically develop 1 to 3 days after you come in contact with a cold virus. Symptoms vary from person to person. They may include:  Runny nose.  Sneezing.  Nasal congestion.  Sinus irritation.  Sore throat.  Loss of voice (laryngitis).  Cough.  Fatigue.  Muscle aches.  Loss of appetite.  Headache.  Low-grade fever. DIAGNOSIS  You might diagnose your own cold based on familiar symptoms, since most people get a cold 2 to 3 times a year. Your caregiver can confirm this based on your exam. Most importantly, your caregiver can check that your symptoms are not due to another disease such as strep throat, sinusitis,  pneumonia, asthma, or epiglottitis. Blood tests, throat tests, and X-rays are not necessary to diagnose a common cold, but they may sometimes be helpful in excluding other more serious diseases. Your caregiver will decide if any further tests are required. RISKS AND COMPLICATIONS  You may be at risk for a more severe case of the common cold if you smoke cigarettes, have chronic heart disease (such as heart failure) or lung disease (such as asthma), or if you have a weakened immune system. The very young and very old are also at risk for more serious infections. Bacterial sinusitis, middle ear infections, and bacterial pneumonia can complicate the common cold. The common cold can worsen asthma and chronic obstructive pulmonary disease (COPD). Sometimes, these complications can require emergency medical care and may be life-threatening. PREVENTION  The best way to protect against getting a cold is to practice good hygiene. Avoid oral or hand contact with people with cold symptoms. Wash your hands often if contact occurs. There is no clear evidence that vitamin C, vitamin E, echinacea, or exercise reduces the chance of developing a cold. However, it is always recommended to get plenty of rest and practice good nutrition. TREATMENT  Treatment is directed at relieving symptoms. There is no cure. Antibiotics are not effective, because the infection is caused by a virus, not by bacteria. Treatment may include:  Increased fluid intake. Sports drinks offer valuable electrolytes, sugars, and fluids.  Breathing heated mist or steam (vaporizer or shower).  Eating chicken soup or other clear broths, and maintaining good nutrition.  Getting plenty of rest.  Using gargles or lozenges for comfort.  Controlling fevers with ibuprofen or acetaminophen as directed by your caregiver.  Increasing usage of your inhaler if you have asthma. Zinc gel and zinc lozenges, taken in the first 24 hours of the common cold, can  shorten the duration and lessen the severity of symptoms. Pain medicines may help with fever, muscle aches, and throat pain. A variety of non-prescription medicines are available to treat congestion and runny nose. Your caregiver can make recommendations and may suggest nasal or lung inhalers for other symptoms.  HOME CARE INSTRUCTIONS   Only take over-the-counter or prescription medicines for pain, discomfort, or fever as directed by your caregiver.  Use a warm mist humidifier or inhale steam from a shower to increase air moisture. This may keep secretions moist and make it easier to breathe.  Drink enough water and fluids to keep your urine clear or pale yellow.  Rest as needed.  Return to work when your temperature has returned to normal or as your caregiver advises. You may need to stay home longer to avoid infecting others. You can also use a face mask and careful hand washing to prevent spread of the virus. SEEK MEDICAL CARE IF:   After the first few days, you feel you are getting worse rather than better.  You need your caregiver's advice about medicines to control symptoms.  You develop chills, worsening shortness of breath, or brown or red sputum. These may be signs of pneumonia.  You develop yellow or brown nasal discharge or pain in the face, especially when you bend forward. These may be signs of sinusitis.  You develop a fever, swollen neck glands, pain with swallowing, or white areas in the back of your throat. These may be signs of strep throat. SEEK IMMEDIATE MEDICAL CARE IF:   You have a fever.  You develop severe or persistent headache, ear pain, sinus pain, or chest pain.  You develop wheezing, a prolonged cough, cough up blood, or have a change in your usual mucus (if you have chronic lung disease).  You develop sore muscles or a stiff neck. Document Released: 04/27/2001 Document Revised: 01/24/2012 Document Reviewed: 02/06/2014 [facility] Patient Information 2015  [place], [place]. This information is not intended to replace advice given to you by your health care provider. Make sure you discuss any questions you have with your health care provider.

## 2014-09-12 ENCOUNTER — Telehealth: Payer: Self-pay | Admitting: Internal Medicine

## 2014-09-12 MED ORDER — PROMETHAZINE-CODEINE 6.25-10 MG/5ML PO SYRP: 5.0000 mL | ORAL_SOLUTION | ORAL | Status: DC | PRN

## 2014-09-12 NOTE — Telephone Encounter (Signed)
OK Prom-cod syr - pls call in Thx

## 2014-09-12 NOTE — Telephone Encounter (Signed)
Pt requesting a cough medicine for dry hacking cough. Pt seen recently at office. Pt requesting cough syrup Asap. [facility] is Pharmacy

## 2014-09-13 ENCOUNTER — Telehealth: Payer: Self-pay | Admitting: *Deleted

## 2014-09-13 MED ORDER — BENZONATATE 200 MG PO CAPS: 200.0000 mg | ORAL_CAPSULE | Freq: Two times a day (BID) | ORAL | Status: DC | PRN

## 2014-09-13 MED ORDER — AZITHROMYCIN 250 MG PO TABS: ORAL_TABLET | ORAL | Status: DC

## 2014-09-13 MED ORDER — PROMETHAZINE-CODEINE 6.25-10 MG/5ML PO SYRP: 5.0000 mL | ORAL_SOLUTION | ORAL | Status: DC | PRN

## 2014-09-13 NOTE — Telephone Encounter (Signed)
Pharmacy in [place] is having problem trying to run rx's under Dr. [name] it stating his license is inactive. "management is aware" gave authorization under Dr. [name] ok per [name]

## 2014-09-13 NOTE — Telephone Encounter (Signed)
Called pt spoke with wife this medicaine can not be call into pharmacy due to codeine will have to pick rx up. Wife states they are currently out of town, and want cough syrup sent to them. Wanting to know is their another cough syrup that he can take that can be call into the pharmacy where they are at...[name]

## 2014-09-13 NOTE — Telephone Encounter (Signed)
Talk with Dr. [name] we are going to cancel rx fro cough syrup, anf he is rx z-pack, tessalon pearles, and pt to get either delsym or robitussin ovc. Notified pt wife with md advisement...[name]

## 2014-09-13 NOTE — Telephone Encounter (Signed)
Printed script for cough syrup we can not call into pharmacy. Gave to md to sign...Andee Poles

## 2014-09-20 DIAGNOSIS — M0589 Other rheumatoid arthritis with rheumatoid factor of multiple sites: Secondary | ICD-10-CM | POA: Diagnosis not present

## 2014-09-25 ENCOUNTER — Encounter: Payer: Self-pay | Admitting: Internal Medicine

## 2014-09-25 ENCOUNTER — Ambulatory Visit (INDEPENDENT_AMBULATORY_CARE_PROVIDER_SITE_OTHER): Payer: Medicare Other | Admitting: Internal Medicine

## 2014-09-25 VITALS — BP 120/72 | HR 69 | Temp 97.1°F | Resp 16 | Ht 71.0 in | Wt 178.0 lb

## 2014-09-25 DIAGNOSIS — L570 Actinic keratosis: Secondary | ICD-10-CM | POA: Diagnosis not present

## 2014-09-25 DIAGNOSIS — I2510 Atherosclerotic heart disease of native coronary artery without angina pectoris: Secondary | ICD-10-CM

## 2014-09-25 DIAGNOSIS — M5440 Lumbago with sciatica, unspecified side: Secondary | ICD-10-CM | POA: Diagnosis not present

## 2014-09-25 DIAGNOSIS — F518 Other sleep disorders not due to a substance or known physiological condition: Secondary | ICD-10-CM

## 2014-09-25 DIAGNOSIS — I2583 Coronary atherosclerosis due to lipid rich plaque: Secondary | ICD-10-CM

## 2014-09-25 DIAGNOSIS — R6889 Other general symptoms and signs: Secondary | ICD-10-CM | POA: Insufficient documentation

## 2014-09-25 MED ORDER — DULOXETINE HCL 30 MG PO CPEP: 30.0000 mg | ORAL_CAPSULE | Freq: Every day | ORAL | Status: DC

## 2014-09-25 MED ORDER — OXYCODONE-ACETAMINOPHEN 5-325 MG PO TABS: 1.0000 | ORAL_TABLET | Freq: Four times a day (QID) | ORAL | Status: DC | PRN

## 2014-09-25 NOTE — Progress Notes (Signed)
Subjective:    HPI C/o vivid dreams C/o R hand AK  F/u recent MI 3 wks ago F/u family stress - better: pain in the back is worse; sleep - better... The patient presents for a follow-up of a new hypertension detected, chronic dyslipidemia, RA controlled partially with medicines. He was recently checked again by Dr Carloyn Manner for severe neck OA, using neck traction now - better. Hydrocodone is not working too well. Oxycodone did work well. .   F/u fatigue, RA pains - worse due to stress  He is on arensia now - not working (remicade stopped working as well). Dr Ouida Sills is treating RA   Wt Readings from Last 3 Encounters:  09/25/14 178 lb (80.74 kg)  09/05/14 179 lb 12.8 oz (81.557 kg)  07/17/14 176 lb 1.9 oz (79.888 kg)   BP Readings from Last 3 Encounters:  09/25/14 120/72  09/05/14 118/80  07/17/14 130/72     Review of Systems  Constitutional: Negative for appetite change, fatigue and unexpected weight change.  HENT: Negative for congestion, nosebleeds, sneezing, sore throat and trouble swallowing.   Eyes: Negative for itching and visual disturbance.  Respiratory: Negative for cough and shortness of breath.   Cardiovascular: Negative for chest pain, palpitations and leg swelling.  Gastrointestinal: Negative for nausea, diarrhea, blood in stool and abdominal distention.  Genitourinary: Negative for frequency and hematuria.  Musculoskeletal: Positive for arthralgias. Negative for back pain, joint swelling, gait problem and neck pain.  Skin: Negative for rash.  Neurological: Negative for dizziness, tremors, speech difficulty and weakness.  Psychiatric/Behavioral: Positive for sleep disturbance. Negative for suicidal ideas, dysphoric mood and agitation. The patient is nervous/anxious.        Objective:   Physical Exam  Constitutional: He is oriented to person, place, and time. He appears well-developed. No distress.  NAD  HENT:  Mouth/Throat: Oropharynx is clear and moist.   Eyes: Conjunctivae are normal. Pupils are equal, round, and reactive to light.  Neck: Normal range of motion. No JVD present. No thyromegaly present.  Cardiovascular: Normal rate, regular rhythm, normal heart sounds and intact distal pulses.  Exam reveals no gallop and no friction rub.   No murmur heard. Pulmonary/Chest: Effort normal and breath sounds normal. No respiratory distress. He has no wheezes. He has no rales. He exhibits no tenderness.  Abdominal: Soft. Bowel sounds are normal. He exhibits no distension and no mass. There is no tenderness. There is no rebound and no guarding.  Musculoskeletal: Normal range of motion. He exhibits no edema or tenderness.  Lymphadenopathy:    He has no cervical adenopathy.  Neurological: He is alert and oriented to person, place, and time. He has normal reflexes. No cranial nerve deficit. He exhibits normal muscle tone. He displays a negative Romberg sign. Coordination and gait normal.  No meningeal signs  Skin: Skin is warm and dry. No rash noted.  Psychiatric: He has a normal mood and affect. His behavior is normal. Judgment and thought content normal.  LS sensitive w/ROM AK on R hand  Lab Results  Component Value Date   WBC 6.6 01/09/2014   HGB 12.5* 01/09/2014   HCT 35.7* 01/09/2014   PLT 240 01/09/2014   GLUCOSE 108* 02/25/2014   CHOL 154 08/26/2014   TRIG 108.0 08/26/2014   HDL 56.10 08/26/2014   LDLDIRECT 168.9 05/24/2013   LDLCALC 76 08/26/2014   ALT 35 08/26/2014   AST 33 08/26/2014   NA 139 02/25/2014   K 3.6 02/25/2014   CL  103 02/25/2014   CREATININE 0.9 02/25/2014   BUN 18 02/25/2014   CO2 27 02/25/2014   TSH 0.737 01/08/2014   PSA 1.52 06/04/2011   INR 0.85 01/08/2014   HGBA1C 6.2* 01/08/2014    Procedure Note :     Procedure : Cryosurgery   Indication:    Actinic keratosis(es)   Risks including unsuccessful procedure , bleeding, infection, bruising, scar, a need for a repeat  procedure and others were explained  to the patient in detail as well as the benefits. Informed consent was obtained verbally.    1 lesion(s)  On R handwas/were treated with liquid nitrogen on a Q-tip in a usual fasion . Band-Aid was applied and antibiotic ointment was given for a later use.   Tolerated well. Complications none.   Postprocedure instructions :     Keep the wounds clean. You can wash them with liquid soap and water. Pat dry with gauze or a Kleenex tissue  Before applying antibiotic ointment and a Band-Aid.   You need to report immediately  if  any signs of infection develop.           Assessment & Plan:

## 2014-09-25 NOTE — Assessment & Plan Note (Signed)
11/15 - due to Cymbalta  Reduce Cymbalta to 30 mg a day

## 2014-09-25 NOTE — Assessment & Plan Note (Signed)
Chronic  Potential benefits of a long term opioids use as well as potential risks (i.e. addiction risk, apnea etc) and complications (i.e. Somnolence, constipation and others) were explained to the patient and were aknowledged. Continue with current prescription therapy as reflected on the Med list.  

## 2014-09-25 NOTE — Assessment & Plan Note (Signed)
Continue with current prescription therapy as reflected on the Med list.  

## 2014-09-25 NOTE — Patient Instructions (Signed)
   Postprocedure instructions :     Keep the wounds clean. You can wash them with liquid soap and water. Pat dry with gauze or a Kleenex tissue  Before applying antibiotic ointment and a Band-Aid.   You need to report immediately  if  any signs of infection develop.    

## 2014-10-03 DIAGNOSIS — L723 Sebaceous cyst: Secondary | ICD-10-CM | POA: Diagnosis not present

## 2014-10-03 DIAGNOSIS — Z85828 Personal history of other malignant neoplasm of skin: Secondary | ICD-10-CM | POA: Diagnosis not present

## 2014-10-11 ENCOUNTER — Other Ambulatory Visit: Payer: Self-pay | Admitting: Internal Medicine

## 2014-10-23 DIAGNOSIS — M0589 Other rheumatoid arthritis with rheumatoid factor of multiple sites: Secondary | ICD-10-CM | POA: Diagnosis not present

## 2014-10-24 ENCOUNTER — Encounter (HOSPITAL_COMMUNITY): Payer: Self-pay | Admitting: Cardiovascular Disease

## 2014-10-28 DIAGNOSIS — M0609 Rheumatoid arthritis without rheumatoid factor, multiple sites: Secondary | ICD-10-CM | POA: Diagnosis not present

## 2014-10-28 DIAGNOSIS — M47812 Spondylosis without myelopathy or radiculopathy, cervical region: Secondary | ICD-10-CM | POA: Diagnosis not present

## 2014-11-11 DIAGNOSIS — Z961 Presence of intraocular lens: Secondary | ICD-10-CM | POA: Diagnosis not present

## 2014-11-11 DIAGNOSIS — H52203 Unspecified astigmatism, bilateral: Secondary | ICD-10-CM | POA: Diagnosis not present

## 2014-11-11 DIAGNOSIS — H0011 Chalazion right upper eyelid: Secondary | ICD-10-CM | POA: Diagnosis not present

## 2014-11-11 DIAGNOSIS — H26493 Other secondary cataract, bilateral: Secondary | ICD-10-CM | POA: Diagnosis not present

## 2014-11-12 ENCOUNTER — Emergency Department (HOSPITAL_COMMUNITY)
Admission: EM | Admit: 2014-11-12 | Discharge: 2014-11-13 | Disposition: A | Discharge status: Home / Self Care | Payer: Medicare Other | Attending: Emergency Medicine | Admitting: Emergency Medicine

## 2014-11-12 ENCOUNTER — Encounter (HOSPITAL_COMMUNITY): Payer: Self-pay | Admitting: *Deleted

## 2014-11-12 ENCOUNTER — Emergency Department (HOSPITAL_COMMUNITY): Payer: Medicare Other

## 2014-11-12 DIAGNOSIS — Z7952 Long term (current) use of systemic steroids: Secondary | ICD-10-CM | POA: Insufficient documentation

## 2014-11-12 DIAGNOSIS — I213 ST elevation (STEMI) myocardial infarction of unspecified site: Secondary | ICD-10-CM | POA: Diagnosis not present

## 2014-11-12 DIAGNOSIS — E785 Hyperlipidemia, unspecified: Secondary | ICD-10-CM | POA: Insufficient documentation

## 2014-11-12 DIAGNOSIS — Z7982 Long term (current) use of aspirin: Secondary | ICD-10-CM | POA: Insufficient documentation

## 2014-11-12 DIAGNOSIS — Z87891 Personal history of nicotine dependence: Secondary | ICD-10-CM | POA: Diagnosis not present

## 2014-11-12 DIAGNOSIS — R61 Generalized hyperhidrosis: Secondary | ICD-10-CM | POA: Insufficient documentation

## 2014-11-12 DIAGNOSIS — R079 Chest pain, unspecified: Secondary | ICD-10-CM | POA: Insufficient documentation

## 2014-11-12 DIAGNOSIS — I10 Essential (primary) hypertension: Secondary | ICD-10-CM | POA: Insufficient documentation

## 2014-11-12 DIAGNOSIS — R0789 Other chest pain: Secondary | ICD-10-CM | POA: Diagnosis not present

## 2014-11-12 DIAGNOSIS — R110 Nausea: Secondary | ICD-10-CM | POA: Insufficient documentation

## 2014-11-12 DIAGNOSIS — I2510 Atherosclerotic heart disease of native coronary artery without angina pectoris: Secondary | ICD-10-CM | POA: Diagnosis not present

## 2014-11-12 DIAGNOSIS — M069 Rheumatoid arthritis, unspecified: Secondary | ICD-10-CM | POA: Insufficient documentation

## 2014-11-12 DIAGNOSIS — G43109 Migraine with aura, not intractable, without status migrainosus: Secondary | ICD-10-CM | POA: Diagnosis not present

## 2014-11-12 DIAGNOSIS — Z79891 Long term (current) use of opiate analgesic: Secondary | ICD-10-CM | POA: Diagnosis not present

## 2014-11-12 DIAGNOSIS — Z79899 Other long term (current) drug therapy: Secondary | ICD-10-CM | POA: Diagnosis not present

## 2014-11-12 DIAGNOSIS — Z86010 Personal history of colonic polyps: Secondary | ICD-10-CM | POA: Diagnosis not present

## 2014-11-12 DIAGNOSIS — Z9861 Coronary angioplasty status: Secondary | ICD-10-CM | POA: Diagnosis not present

## 2014-11-12 DIAGNOSIS — R0602 Shortness of breath: Secondary | ICD-10-CM | POA: Diagnosis not present

## 2014-11-12 DIAGNOSIS — Z9889 Other specified postprocedural states: Secondary | ICD-10-CM | POA: Insufficient documentation

## 2014-11-12 DIAGNOSIS — K219 Gastro-esophageal reflux disease without esophagitis: Secondary | ICD-10-CM | POA: Insufficient documentation

## 2014-11-12 LAB — CBC
HCT: 36.4 % — ABNORMAL LOW (ref 39.0–52.0)
Hemoglobin: 12.4 g/dL — ABNORMAL LOW (ref 13.0–17.0)
MCH: 32.7 pg (ref 26.0–34.0)
MCHC: 34.1 g/dL (ref 30.0–36.0)
MCV: 96 fL (ref 78.0–100.0)
Platelets: 249 10*3/uL (ref 150–400)
RBC: 3.79 MIL/uL — ABNORMAL LOW (ref 4.22–5.81)
RDW: 13.7 % (ref 11.5–15.5)
WBC: 6.1 10*3/uL (ref 4.0–10.5)

## 2014-11-12 LAB — BASIC METABOLIC PANEL
Anion gap: 8 (ref 5–15)
BUN: 15 mg/dL (ref 6–23)
CO2: 22 mmol/L (ref 19–32)
Calcium: 9.4 mg/dL (ref 8.4–10.5)
Chloride: 110 mEq/L (ref 96–112)
Creatinine, Ser: 0.86 mg/dL (ref 0.50–1.35)
GFR calc Af Amer: >90 mL/min (ref >90)
GFR calc non Af Amer: 80 mL/min — ABNORMAL LOW (ref >90)
Glucose, Bld: 130 mg/dL — ABNORMAL HIGH (ref 70–99)
Potassium: 3.3 mmol/L — ABNORMAL LOW (ref 3.5–5.1)
Sodium: 140 mmol/L (ref 135–145)

## 2014-11-12 LAB — I-STAT TROPONIN, ED: Troponin i, poc: 0 ng/mL (ref 0.00–0.08)

## 2014-11-12 NOTE — ED Provider Notes (Signed)
CSN: [id number]     Arrival date & time 11/12/14  2312 History  This chart was scribe for Veryl Speak, MD by Judithann Sauger, ED Scribe. The patient was seen in room B17C/B17C and the patient's care was started at 11:50 PM.    Chief Complaint  Patient presents with  . Chest Pain   The history is provided by the patient. No language interpreter was used.   HPI Comments: [name] is a 80 y.o. male brought in by ambulance, who presents to the Emergency Department complaining of a pressure CP onset about 2 hour ago while walking his dog. He reports that he had 2 stents after an MI in February and had been doing well until today. He reports associated nausea, diaphoresis, and SOB today. He denies vomiting. His wife reports that he was incoherent after onset. He states that he took 2 doses of NTG PTA with relief. He denies doing anything strenuous before onset. He also denies that these symptoms are similar to his MI in the past.    Past Medical History  Diagnosis Date  . Colon polyp   . Hyperlipidemia   . Osteoarthritis   . Migraine with aura   . RA (rheumatoid arthritis)   . Premature atrial contractions   . GERD (gastroesophageal reflux disease)   . Hypertension   . CAD (coronary artery disease) 12/2013    s/p inferior STEMI 01/08/2014; LHC 01/08/14: total RCA occlusion s/p 3.5x20mm Xience DES distal RCA and 3.5x28 mm DES mid RCA, 60-70% mid LAD stenosis, EF 55%  . Palpitations   . STEMI (ST elevation myocardial infarction)    Past Surgical History  Procedure Laterality Date  . Back surgery  2004  . Left heart catheterization with coronary angiogram N/A 01/08/2014    Procedure: LEFT HEART CATHETERIZATION WITH CORONARY ANGIOGRAM;  Surgeon: Troy Sine, MD;  Location: [facility] CATH LAB;  Service: Cardiovascular;  Laterality: N/A;  . Percutaneous coronary stent intervention (pci-s)  01/08/2014    Procedure: PERCUTANEOUS CORONARY STENT INTERVENTION (PCI-S);  Surgeon: Troy Sine, MD;   [facility] C[facility];  Service: Cardiovascular;;   Family History  Problem Relation Age of Onset  . Colon cancer Neg Hx   . Hypertension Mother   . Heart disease Father    History  Substance Use Topics  . Smoking status: Former Research scientist (life sciences)  . Smokeless tobacco: Never Used     Comment: Quit 50 yrs ago (as of 2015)  . Alcohol Use: 4.2 - 8.4 oz/week    7-14 Glasses of wine per week     Comment: 1-2 glasses of wine nightly    Review of Systems  Constitutional: Positive for diaphoresis.  Respiratory: Positive for shortness of breath.   Cardiovascular: Positive for chest pain.  Gastrointestinal: Positive for nausea. Negative for vomiting.  All other systems reviewed and are negative.  A complete 10 system review of systems was obtained and all systems are negative except as noted in the HPI and PMH.      Allergies  Methotrexate; Atorvastatin; and Pravastatin sodium  Home Medications   Prior to Admission medications   Medication Sig Start Date End Date Taking? Authorizing Provider  Abatacept (ORENCIA [place]) Inject into the skin every 30 (thirty) days.    Historical Provider, MD  aspirin 81 MG tablet Take 81 mg by mouth daily.    Historical Provider, MD  Cholecalciferol (VITAMIN D) 1000 UNITS capsule Take 1,000 Units by mouth daily.     Historical Provider,  MD  CRESTOR 10 MG tablet TAKE ONE TABLET EACH DAY 07/08/14   Thompson Grayer, MD  Cyanocobalamin 2500 MCG SUBL Place 1 tablet under the tongue daily.     Historical Provider, MD  DULoxetine (CYMBALTA) 30 MG capsule Take 1 capsule (30 mg total) by mouth daily. 09/25/14   Aleksei Plotnikov V, MD  DULoxetine (CYMBALTA) 60 MG capsule TAKE ONE CAPSULE EACH DAY 10/14/14   Aleksei Plotnikov V, MD  ezetimibe (ZETIA) 10 MG tablet Take 5 mg by mouth daily.  02/07/14   Thompson Grayer, MD  FLUZONE HIGH-DOSE 0.5 ML SUSY  09/01/14   Historical Provider, MD  lisinopril (PRINIVIL,ZESTRIL) 5 MG tablet Take 1 tablet (5 mg total) by mouth daily. 02/25/14    Thompson Grayer, MD  LORazepam (ATIVAN) 0.5 MG tablet Take 1 tablet (0.5 mg total) by mouth 2 (two) times daily as needed for anxiety. 05/13/14   Aleksei Plotnikov V, MD  metoprolol tartrate (LOPRESSOR) 25 MG tablet TAKE ONE TABLET TWICE DAILY 08/05/14   Thompson Grayer, MD  nitroGLYCERIN (NITROSTAT) 0.4 MG SL tablet Place 1 tablet (0.4 mg total) under the tongue every 5 (five) minutes x 3 doses as needed for chest pain. 01/10/14   Brooke O Edmisten, PA-C  omeprazole (PRILOSEC) 40 MG capsule Take 40 mg by mouth daily.    Historical Provider, MD  oxyCODONE-acetaminophen (PERCOCET/ROXICET) 5-325 MG per tablet Take 1 tablet by mouth every 6 (six) hours as needed for severe pain. 09/25/14   Aleksei Plotnikov V, MD  predniSONE (DELTASONE) 5 MG tablet Take 5 mg by mouth daily with breakfast.    Historical Provider, MD  Propylene Glycol (SYSTANE BALANCE OP) Apply 1 drop to eye as needed (dry eye).    Historical Provider, MD  rosuvastatin (CRESTOR) 20 MG tablet Take 1 tablet (20 mg total) by mouth daily. 08/28/14   Thompson Grayer, MD  Ticagrelor (BRILINTA) 90 MG TABS tablet Take 1 tablet (90 mg total) by mouth 2 (two) times daily. 01/10/14   Brooke O Edmisten, PA-C  zolpidem (AMBIEN) 10 MG tablet Take 5 mg by mouth at bedtime as needed for sleep. 05/13/14   Aleksei Plotnikov V, MD   BP 109/72 mmHg  Pulse 82  Temp(Src) 98 F (36.7 C) (Oral)  Resp 16  Ht 5\' 11"  (1.803 m)  Wt 175 lb (79.379 kg)  BMI 24.42 kg/m2  SpO2 97% Physical Exam  Constitutional: He is oriented to person, place, and time. He appears well-developed and well-nourished. No distress.  HENT:  Head: Normocephalic and atraumatic.  Eyes: Conjunctivae and EOM are normal.  Neck: Neck supple. No tracheal deviation present.  Cardiovascular: Normal rate.   Pulmonary/Chest: Effort normal. No respiratory distress.  Musculoskeletal: Normal range of motion.  Neurological: He is alert and oriented to person, place, and time.  Skin: Skin is warm and dry.   Psychiatric: He has a normal mood and affect. His behavior is normal.  Nursing note and vitals reviewed.   ED Course  Procedures (including critical care time) DIAGNOSTIC STUDIES: Oxygen Saturation is 97% on RA, adequate by my interpretation.    COORDINATION OF CARE: 12:02 AM- Pt advised of plan for treatment and pt agrees.    Labs Review Labs Reviewed  CBC - Abnormal; Notable for the following:    RBC 3.79 (*)    Hemoglobin 12.4 (*)    HCT 36.4 (*)    All other components within normal limits  BASIC METABOLIC PANEL - Abnormal; Notable for the following:    Potassium  3.3 (*)    Glucose, Bld 130 (*)    GFR calc non Af Amer 80 (*)    All other components within normal limits  Randolm Idol, ED    Imaging Review Dg Chest Port 1 View  11/12/2014   CLINICAL DATA:  80 year old male with chest pressure, shortness breath and dizziness. History of prior myocardial infarction.  EXAM: PORTABLE CHEST - 1 VIEW  COMPARISON:  Chest x-ray 01/08/2014.  FINDINGS: Lung volumes are normal. No consolidative airspace disease. No pleural effusions. No pneumothorax. No pulmonary nodule or mass noted. Pulmonary vasculature and the cardiomediastinal silhouette are within normal limits.  IMPRESSION: No radiographic evidence of acute cardiopulmonary disease.   Electronically Signed   By: Vinnie Langton M.D.   On: 11/12/2014 23:46     EKG Interpretation   Date/Time:  Tuesday November 12 2014 23:20:13 EST Ventricular Rate:  85 PR Interval:  172 QRS Duration: 100 QT Interval:  388 QTC Calculation: 461 R Axis:   -17 Text Interpretation:  Sinus rhythm Atrial premature complexes Probable  left atrial enlargement Borderline left axis deviation Abnormal R-wave  progression, early transition No significant change since 01/10/14  Confirmed by DELOS  MD, Nhung Danko ([id number]) on 11/12/2014 11:57:27 PM      MDM   Final diagnoses:  None    Patient presents with complaints of chest discomfort. He is  status post MI with stent approximately 9 or 10 months ago. Today's workup reveals an unchanged EKG and negative troponin. He has been pain-free since arriving in the ER. I've discussed the case with the cardiology fellow on call. We have reviewed his record and the recommendations are to perform a delta troponin. Slides the patient remains pain-free and his troponin is negative he will be discharged.  The second troponin returned 0. He will be discharged to home with follow-up with his cardiologist in the next week. Ouida Sills is to return if his symptoms worsen or change.  I personally performed the services described in this documentation, which was scribed in my presence. The recorded information has been reviewed and is accurate.    Veryl Speak, MD 11/13/14 [phone]

## 2014-11-12 NOTE — ED Notes (Signed)
Pt arrives via EMS from home. Pt started experiencing chest pain 45 minutes ago after bending over while outside. Pt took 2 doses of NTG and became chest pain free. Pt also took 324 ASA. Upon EMS arrival pt was still pain free. Pt had an MI in February and had 2 stents placed. 12 lead by EMS was unremarkable. EMS vitals: 104/64 HR 80

## 2014-11-13 ENCOUNTER — Telehealth: Payer: Self-pay | Admitting: Internal Medicine

## 2014-11-13 ENCOUNTER — Ambulatory Visit (INDEPENDENT_AMBULATORY_CARE_PROVIDER_SITE_OTHER): Payer: Medicare Other | Admitting: Nurse Practitioner

## 2014-11-13 ENCOUNTER — Encounter: Payer: Self-pay | Admitting: Nurse Practitioner

## 2014-11-13 VITALS — BP 120/70 | HR 75 | Ht 71.5 in | Wt 182.8 lb

## 2014-11-13 DIAGNOSIS — I10 Essential (primary) hypertension: Secondary | ICD-10-CM | POA: Diagnosis not present

## 2014-11-13 DIAGNOSIS — I25119 Atherosclerotic heart disease of native coronary artery with unspecified angina pectoris: Secondary | ICD-10-CM

## 2014-11-13 DIAGNOSIS — E876 Hypokalemia: Secondary | ICD-10-CM

## 2014-11-13 DIAGNOSIS — R0789 Other chest pain: Secondary | ICD-10-CM

## 2014-11-13 DIAGNOSIS — I2510 Atherosclerotic heart disease of native coronary artery without angina pectoris: Secondary | ICD-10-CM

## 2014-11-13 DIAGNOSIS — R079 Chest pain, unspecified: Secondary | ICD-10-CM | POA: Diagnosis not present

## 2014-11-13 LAB — I-STAT TROPONIN, ED: Troponin i, poc: 0 ng/mL (ref 0.00–0.08)

## 2014-11-13 NOTE — Telephone Encounter (Signed)
Will add on to flex schedule tomorrow.  Jari Sportsman is going to call and schedule the patient.

## 2014-11-13 NOTE — Patient Instructions (Addendum)
We will be checking the following labs today BMET  Stay on your current medicines  Ok to resume your normal activities  We are going to arrange for a stress Myoview  If you have more symptoms, call here or call EMS again.   Call the [place] office at [phone] if you have any questions, problems or concerns.

## 2014-11-13 NOTE — Discharge Instructions (Signed)
Follow-up with your cardiologist in the next few days, and return to the emergency department if your symptoms substantially worsen or change.   Chest Pain (Nonspecific) It is often hard to give a specific diagnosis for the cause of chest pain. There is always a chance that your pain could be related to something serious, such as a heart attack or a blood clot in the lungs. You need to follow up with your health care provider for further evaluation. CAUSES   Heartburn.  Pneumonia or bronchitis.  Anxiety or stress.  Inflammation around your heart (pericarditis) or lung (pleuritis or pleurisy).  A blood clot in the lung.  A collapsed lung (pneumothorax). It can develop suddenly on its own (spontaneous pneumothorax) or from trauma to the chest.  Shingles infection (herpes zoster virus). The chest wall is composed of bones, muscles, and cartilage. Any of these can be the source of the pain.  The bones can be bruised by injury.  The muscles or cartilage can be strained by coughing or overwork.  The cartilage can be affected by inflammation and become sore (costochondritis). DIAGNOSIS  Lab tests or other studies may be needed to find the cause of your pain. Your health care provider may have you take a test called an ambulatory electrocardiogram (ECG). An ECG records your heartbeat patterns over a 24-hour period. You may also have other tests, such as:  Transthoracic echocardiogram (TTE). During echocardiography, sound waves are used to evaluate how blood flows through your heart.  Transesophageal echocardiogram (TEE).  Cardiac monitoring. This allows your health care provider to monitor your heart rate and rhythm in real time.  Holter monitor. This is a portable device that records your heartbeat and can help diagnose heart arrhythmias. It allows your health care provider to track your heart activity for several days, if needed.  Stress tests by exercise or by giving medicine that  makes the heart beat faster. TREATMENT   Treatment depends on what may be causing your chest pain. Treatment may include:  Acid blockers for heartburn.  Anti-inflammatory medicine.  Pain medicine for inflammatory conditions.  Antibiotics if an infection is present.  You may be advised to change lifestyle habits. This includes stopping smoking and avoiding alcohol, caffeine, and chocolate.  You may be advised to keep your head raised (elevated) when sleeping. This reduces the chance of acid going backward from your stomach into your esophagus. Most of the time, nonspecific chest pain will improve within 2-3 days with rest and mild pain medicine.  HOME CARE INSTRUCTIONS   If antibiotics were prescribed, take them as directed. Finish them even if you start to feel better.  For the next few days, avoid physical activities that bring on chest pain. Continue physical activities as directed.  Do not use any tobacco products, including cigarettes, chewing tobacco, or electronic cigarettes.  Avoid drinking alcohol.  Only take medicine as directed by your health care provider.  Follow your health care provider's suggestions for further testing if your chest pain does not go away.  Keep any follow-up appointments you made. If you do not go to an appointment, you could develop lasting (chronic) problems with pain. If there is any problem keeping an appointment, call to reschedule. SEEK MEDICAL CARE IF:   Your chest pain does not go away, even after treatment.  You have a rash with blisters on your chest.  You have a fever. SEEK IMMEDIATE MEDICAL CARE IF:   You have increased chest pain or pain that  spreads to your arm, neck, jaw, back, or abdomen.  You have shortness of breath.  You have an increasing cough, or you cough up blood.  You have severe back or abdominal pain.  You feel nauseous or vomit.  You have severe weakness.  You faint.  You have chills. This is an  emergency. Do not wait to see if the pain will go away. Get medical help at once. Call your local emergency services (911 in [place] Do not drive yourself to the hospital. MAKE SURE YOU:   Understand these instructions.  Will watch your condition.  Will get help right away if you are not doing well or get worse. Document Released: 08/11/2005 Document Revised: 11/06/2013 Document Reviewed: 06/06/2008 [facility] Patient Information 2015 [place], [place]. This information is not intended to replace advice given to you by your health care provider. Make sure you discuss any questions you have with your health care provider.

## 2014-11-13 NOTE — Telephone Encounter (Signed)
New message      Pt went to ER last night via ambulance.  Pt had chest pain--took nitro.  He was very clammy and disoriented.  ER checked him out and sent him home and told pt to call his doctor this am.  Pt slept all night and is still sleeping. Wife is calling to see if pt needs a follow up today.

## 2014-11-13 NOTE — Progress Notes (Signed)
[name] Date of Birth: September 06, 1933 Medical Record #[id number]  History of Present Illness: Mr. Buresh is seen back today for a work in visit. Seen for Dr Rayann Heman. He is a 80 year old male with HLD, OA, RA, GERD, PACs, HTN and CAD with past inferior STEMI 12/2103 with DES x 2 to distal and mid RCA. Noted to have residual LAD disease and a normal EF.  Last seen here in September - felt to be doing well.  In the ER yesterday - taken by ambulance. He had chest pressure while walking his dog associated with nausea, diaphoresis and dyspnea. He was reportedly incoherent after the onset. EKG and troponin negative. Discussed with the cardiology fellow on call and the patient was to follow up here. Thus added to my schedule for this afternoon. Potassium was a little low - does not look like it was replaced.   Comes in today. Here with his wife. He notes that he has been doing well up until last evening. He exercises 3 x a week and did so on Monday with no problem. They had friends over last night - went out to eat - he did not eat very much. Did have alcohol. About 9:30 pm went to walk his dog - had to bend over to clean up the dog poop - when he stood back up he was lightheaded and then had chest pressure over the midsternum. He went back home, got ready for bed, was still having chest pressure and so he took his 1st NTG. No relief. Then got sweaty so he took a 2nd. He was then nauseated. Wife thought he looked gray. She also thought he was "jibberish".  EMS was called. They told him his BP was low. He was feeling better by the time EMS arrived - no more chest pressure. Was taken on to the ER and then home around 3:30 am this morning. He has had no more issues and currently feels fine. He notes that the symptoms he had last night are in NO way like what he felt with his MI.   Current Outpatient Prescriptions  Medication Sig Dispense Refill  . Abatacept (ORENCIA [place]) Inject 1 Applicatorful into the skin See  admin instructions. Every 4 weeks    . aspirin 81 MG tablet Take 81 mg by mouth daily.    . Cholecalciferol (VITAMIN D) 1000 UNITS capsule Take 1,000 Units by mouth daily.     . Cyanocobalamin 2500 MCG SUBL Place 1 tablet under the tongue daily.     . DULoxetine (CYMBALTA) 60 MG capsule TAKE ONE CAPSULE EACH DAY 30 capsule 3  . ezetimibe (ZETIA) 10 MG tablet Take 5 mg by mouth daily.     [name] lisinopril (PRINIVIL,ZESTRIL) 5 MG tablet Take 1 tablet (5 mg total) by mouth daily. 90 tablet 3  . LORazepam (ATIVAN) 0.5 MG tablet Take 1 tablet (0.5 mg total) by mouth 2 (two) times daily as needed for anxiety. 60 tablet 3  . metoprolol tartrate (LOPRESSOR) 25 MG tablet TAKE ONE TABLET TWICE DAILY 60 tablet 3  . nitroGLYCERIN (NITROSTAT) 0.4 MG SL tablet Place 1 tablet (0.4 mg total) under the tongue every 5 (five) minutes x 3 doses as needed for chest pain. 30 tablet 12  . omeprazole (PRILOSEC) 40 MG capsule Take 40 mg by mouth daily.    [name] oxyCODONE-acetaminophen (PERCOCET/ROXICET) 5-325 MG per tablet Take 1 tablet by mouth every 6 (six) hours as needed for severe pain. 100 tablet 0  .  predniSONE (DELTASONE) 1 MG tablet Take 4 mg by mouth daily with breakfast.    . Propylene Glycol (SYSTANE BALANCE OP) Apply 1 drop to eye as needed (dry eye).    . rosuvastatin (CRESTOR) 20 MG tablet Take 1 tablet (20 mg total) by mouth daily. 90 tablet 3  . Ticagrelor (BRILINTA) 90 MG TABS tablet Take 1 tablet (90 mg total) by mouth 2 (two) times daily. 60 tablet 12  . zolpidem (AMBIEN) 10 MG tablet Take 5 mg by mouth at bedtime as needed for sleep.     No current facility-administered medications for this visit.    Allergies  Allergen Reactions  . Methotrexate Other (See Comments)    REACTION: tachycardia  . Atorvastatin Other (See Comments)    Muscle pain, leg cramps  . Pravastatin Sodium Other (See Comments)    REACTION: cramps, fatigue    Past Medical History  Diagnosis Date  . Colon polyp   .  Hyperlipidemia   . Osteoarthritis   . Migraine with aura   . RA (rheumatoid arthritis)   . Premature atrial contractions   . GERD (gastroesophageal reflux disease)   . Hypertension   . CAD (coronary artery disease) 12/2013    s/p inferior STEMI 01/08/2014; LHC 01/08/14: total RCA occlusion s/p 3.5x71mm Xience DES distal RCA and 3.5x28 mm DES mid RCA, 60-70% mid LAD stenosis, EF 55%  . Palpitations   . STEMI (ST elevation myocardial infarction)     Past Surgical History  Procedure Laterality Date  . Back surgery  2004  . Left heart catheterization with coronary angiogram N/A 01/08/2014    Procedure: LEFT HEART CATHETERIZATION WITH CORONARY ANGIOGRAM;  Surgeon: Troy Sine, MD;  Location: [facility] CATH LAB;  Service: Cardiovascular;  Laterality: N/A;  . Percutaneous coronary stent intervention (pci-s)  01/08/2014    Procedure: PERCUTANEOUS CORONARY STENT INTERVENTION (PCI-S);  Surgeon: Troy Sine, MD;  Location: [facility] CATH LAB;  Service: Cardiovascular;;    History  Smoking status  . Former Smoker  Smokeless tobacco  . Never Used    Comment: Quit 50 yrs ago (as of 2015)    History  Alcohol Use  . 4.2 - 8.4 oz/week  . 7-14 Glasses of wine per week    Comment: 1-2 glasses of wine nightly    Family History  Problem Relation Age of Onset  . Colon cancer Neg Hx   . Hypertension Mother   . Heart disease Father     Review of Systems: The review of systems is per the HPI.  All other systems were reviewed and are negative.  Physical Exam: BP 120/70 mmHg  Pulse 75  Ht 5' 11.5" (1.816 m)  Wt 182 lb 12.8 oz (82.918 kg)  BMI 25.14 kg/m2 Patient is very pleasant and in no acute distress. He looks younger than his stated age. Skin is warm and dry. Color is normal.  HEENT is unremarkable. Normocephalic/atraumatic. PERRL. Sclera are nonicteric. Neck is supple. No masses. No JVD. Lungs are clear. Cardiac exam shows a regular rate and rhythm. Abdomen is soft. Extremities are without edema.  Gait and ROM are intact. No gross neurologic deficits noted.  Wt Readings from Last 3 Encounters:  11/13/14 182 lb 12.8 oz (82.918 kg)  11/12/14 175 lb (79.379 kg)  09/25/14 178 lb (80.74 kg)    LABORATORY DATA/PROCEDURES: EKG today with NSR - no acute changes.   Lab Results  Component Value Date   WBC 6.1 11/12/2014   HGB 12.4* 11/12/2014  HCT 36.4* 11/12/2014   PLT 249 11/12/2014   GLUCOSE 130* 11/12/2014   CHOL 154 08/26/2014   TRIG 108.0 08/26/2014   HDL 56.10 08/26/2014   LDLDIRECT 168.9 05/24/2013   LDLCALC 76 08/26/2014   ALT 35 08/26/2014   AST 33 08/26/2014   NA 140 11/12/2014   K 3.3* 11/12/2014   CL 110 11/12/2014   CREATININE 0.86 11/12/2014   BUN 15 11/12/2014   CO2 22 11/12/2014   TSH 0.737 01/08/2014   PSA 1.52 06/04/2011   INR 0.85 01/08/2014   HGBA1C 6.2* 01/08/2014    BNP (last 3 results) No results for input(s): PROBNP in the last 8760 hours.   Lab Results  Component Value Date   CKTOTAL 114 05/24/2013   CKMB 2.5 09/01/2011   TROPONINI 8.79* 01/09/2014     EMERGENCY CARDIAC CATHETERIZATION / PERCUTANEOUS CORONARY INTERVENTION    HISTORY: Mr. Srihaan Mastrangelo is a 77 -year-old male who has a history of hypertension, hyperlipidemia, rheumatoid arthritis, and PVCs who presented to the hospital today in the setting of an ST segment elevation inferior wall myocardial infarction. For the past 2 weeks, he has experienced "indigestion "discomfort. In the emergency room, ECG revealed inferior ST elevation c/w inferior wall STEMI and he is transported to the cardiac catheterization for acute catheterization and possible coronary intervention.   PROCEDURE:  The patient was brought to the second floor [facility] cath lab in the postabsorptive state. Upon arrival to the catheterization laboratory, the patient was still experiencing chest pain 5/10. He had received 4000 units of heparin the emergency room and aspirin. Versed 1 mg and fentanyl 25 mcg  were administered for sedation. His right femoral artery was punctured anteriorly and a 6 French sheath was inserted without difficulty. Diagnostic catheterization was done utilizing a 6 [place] Judkins 4 left coronary catheter and a 6 [place] FR4 guiding catheter was inserted into the RCA which revealed a total/subtotal occlusion with TIMI 1/2 flow. Initial blood pressure was notable for significant hypertension at 180/100 and IV nitroglycerin was started. Angiomax bolus plus infusion is administered. The patient was given Brilinta 180 mg orally for antiplatelet therapy. An Asahi medium wire was advanced down the RCA and was able to cross the distal subtotal stenosis/occlusion. A 2.5x12 mm Trek balloon was used for initial dilatation in the distal RCA. The patient developed significant hypotension with a blood pressure of 65-70, his nitroglycerin was stopped, he was treated with normal saline bolus and also dopamine was administered initially at 4 and then increase to 5 mcg. Initially there was continued slow flow following initial dilatation. The 2.5 balloon was then exchanged for a 3.0x15 mm TYrek balloon. 3 inflations at 4-9 atmospheres were made. A Xience Alpine 3.0x23 mm DES stent was then inserted into the distal RCA at the site of subtotal/total occlusion and was deployed x2 at 12 and 13 atmospheres. Due to a long area of 70% stenosis in the mid RCA a Xience Alpine 3.5x28 mm DES stent was then inserted into the mid RCA to cover the entire stenosis and was dilated x2at 11 and 13 atmospheres. An [place] Trek 3.5x15 mm was used for post stent dilatation at both stent sites with the distal stent being dilated to approximately 3.25 mm and the mid stent being dilated to approximately 3.60 mm. Scout angiography confirmed an excellent angiographic result. A 6 French pigtail catheter was then inserted and left ventriculography was performed. The arterial sheath was sutured in place with plans to continue full  dose Angiomax  for 4 hours post procedure. The patient left the catheterization laboratory pain-free and with stable hemodynamics and with normalization of blood pressure remained was weaned and discontinued prior to transfer to the coronary care unit.   HEMODYNAMICS:  Central Aorta: 180/100  Aortic pressure decreased to 66/45  On pullback: Left ventricular pressure 110/8; aortic pressure 110/68   ANGIOGRAPHY:  1. Left main: Very short and immediately bifurcated into an LAD and left circumflex coronary artery 2. LAD: Moderate size vessel that had 60-70% stenosis in the midsegment with midsystolic bridging component 3. Left circumflex: Angiographically normal vessel which gave rise to 2 major marginal branches.  4. Right coronary artery: Large dominant right coronary artery that had diffuse 70% mid stenosis and then was subtotally/totally occluded beyond the acute margin the distal RCA with initial TIMI one half flow.  Following percutaneous coronary intervention with PTCA and stenting the 99/100% distal RCA stenosis was reduced to 0% with ultimate insertion of a 3.0x23 mm Xience Alpine stent postdilated to 3.25 mm, and the diffuse 70% mid RCA stenosis was reduced to 0% with the 3.5x28 mm Xience Alpine Stent postdilated to 3.6 mm. The RCA was a large vessel that gave rise to a large PDA, a large PLA, and several inferolateral branches.  Left ventriculography revealed preserved global the function without apparent wall motion abnormality. Ejection fraction was approximately 55%.  IMPRESSION:  Acute inferior wall ST segment elevation myocardial infarction secondary to total/subtotal RCA occlusion in a large dominant RCA  60 - 70% mid LAD stenosis with mid systolic systolic bridging  Normal left circumflex coronary artery  Diffuse 70% mid RCA stenosis and 99/100% distal RCA stenosis with initial TIMI one half flow  Successful acute intervention to the distal RCA with the subtotal stenosis being  reduced to 0% and restoration of TIMI 3 flow with insertion of a 3.0x23 mm Xience Alpine DES stent postdilated to 3.25 mm, and insertion of a 3.5x28 mm Xience Alpine DES stent post dilated to 3.6 mm into the mid RCA with the 70% stenosis reduced to 0%  Door to balloon time: 31 minutes  Angiomax/Brilinta/NTG and transient dopamine    Troy Sine, MD, [facility] 01/08/2014 10:01 PM   Echo Study Conclusions from 12/2013 Left ventricle: The cavity size was normal. Wall thickness was increased in a pattern of mild LVH. There was focal basal hypertrophy. Systolic function was normal. The estimated ejection fraction was in the range of 55% to 60%. Wall motion was normal; there were no regional wall motion abnormalities.     Assessment / Plan: 1. Chest pain - one episode - has known residual CAD - will arrange for stress Myoview. I think he probably had a vagal spell from the 2 NTGs. I have asked him to resume his normal activities. Stress myoview arranged.  2. Known CAD with stents x 2 to RCA and has residual LAD disease in a 60 to 70% fashion. Stress Myoview to further risk stratify  3. HTN -  BP is great on current regimen  4. HLD -  On statin and Zetia.  Further disposition to follow.   Patient is agreeable to this plan and will call if any problems develop in the interim.   Burtis Junes, RN, [facility] [address] [place], [place]  [id number] [phone]

## 2014-11-13 NOTE — Telephone Encounter (Signed)
error 

## 2014-11-14 ENCOUNTER — Ambulatory Visit: Payer: Medicare Other | Admitting: Physician Assistant

## 2014-11-14 LAB — BASIC METABOLIC PANEL
BUN: 21 mg/dL (ref 6–23)
CO2: 28 mEq/L (ref 19–32)
Calcium: 9.3 mg/dL (ref 8.4–10.5)
Chloride: 109 mEq/L (ref 96–112)
Creatinine, Ser: 0.9 mg/dL (ref 0.4–1.5)
GFR: 86.46 mL/min (ref >60.00)
Glucose, Bld: 121 mg/dL — ABNORMAL HIGH (ref 70–99)
Potassium: 4.2 mEq/L (ref 3.5–5.1)
Sodium: 140 mEq/L (ref 135–145)

## 2014-11-18 ENCOUNTER — Telehealth: Payer: Self-pay | Admitting: Internal Medicine

## 2014-11-18 NOTE — Telephone Encounter (Signed)
Routed to Dr/ Allred and Remer Macho

## 2014-11-18 NOTE — Telephone Encounter (Signed)
New message      Pt is having nuclear stress test tomorrow.  Wife is concerned that pt is sleeping a lot----he is very fatigued.  Pt called 911 on last Monday night for chest pain.  He did not stay in the hosp.  Pt is not himself.  Wife wanted dr to be aware when pt comes in on Wednesday.   Wife is concerned that pt will not say anything during test-- you do not have to call her back

## 2014-11-18 NOTE — Telephone Encounter (Signed)
OK.  Will do.

## 2014-11-18 NOTE — Telephone Encounter (Signed)
Noted.  Someone will need to follow up on the stress test.  I will be leaving the country tomorrow morning and not back until Sunday.

## 2014-11-19 ENCOUNTER — Encounter: Payer: Self-pay | Admitting: Internal Medicine

## 2014-11-19 ENCOUNTER — Encounter: Payer: Self-pay | Admitting: Cardiology

## 2014-11-20 ENCOUNTER — Ambulatory Visit (HOSPITAL_COMMUNITY): Payer: Medicare Other | Attending: Cardiovascular Disease | Admitting: Radiology

## 2014-11-20 DIAGNOSIS — I25119 Atherosclerotic heart disease of native coronary artery with unspecified angina pectoris: Secondary | ICD-10-CM | POA: Diagnosis not present

## 2014-11-20 DIAGNOSIS — I10 Essential (primary) hypertension: Secondary | ICD-10-CM | POA: Diagnosis not present

## 2014-11-20 DIAGNOSIS — R0789 Other chest pain: Secondary | ICD-10-CM

## 2014-11-20 DIAGNOSIS — R61 Generalized hyperhidrosis: Secondary | ICD-10-CM | POA: Insufficient documentation

## 2014-11-20 DIAGNOSIS — R079 Chest pain, unspecified: Secondary | ICD-10-CM | POA: Diagnosis not present

## 2014-11-20 DIAGNOSIS — I2510 Atherosclerotic heart disease of native coronary artery without angina pectoris: Secondary | ICD-10-CM | POA: Insufficient documentation

## 2014-11-20 DIAGNOSIS — R110 Nausea: Secondary | ICD-10-CM | POA: Diagnosis not present

## 2014-11-20 DIAGNOSIS — R0609 Other forms of dyspnea: Secondary | ICD-10-CM | POA: Diagnosis not present

## 2014-11-20 MED ORDER — TECHNETIUM TC 99M SESTAMIBI GENERIC - CARDIOLITE
10.0000 | Freq: Once | INTRAVENOUS | Status: AC | PRN
  Administered 2014-11-20: 10 via INTRAVENOUS

## 2014-11-20 MED ORDER — TECHNETIUM TC 99M SESTAMIBI GENERIC - CARDIOLITE
30.0000 | Freq: Once | INTRAVENOUS | Status: AC | PRN
  Administered 2014-11-20: 30 via INTRAVENOUS

## 2014-11-20 NOTE — Progress Notes (Signed)
[facility] [address] [place], [place] [address] [phone]    Cardiology Nuclear Med Study  [name] is a 81 y.o. male     MRN : [id number]     DOB: Jun 09, 1933  Procedure Date: 11/20/2014  Nuclear Med Background Indication for Stress Test:  Evaluation for Ischemia and [facility] 12/15 Chest Pain History:  CAD, MPI 20102 (normal) EF 61% Cardiac Risk Factors: Hypertension  Symptoms:  Chest Pain with Exertion, Diaphoresis, DOE and Nausea   Nuclear Pre-Procedure Caffeine/Decaff Intake:  None NPO After: 7:00pm   Lungs:  clear O2 Sat: 95% on room air. IV 0.9% NS with Angio Cath:  22g  IV Site: R Hand  IV Started by:  Crissie Figures, RN  Chest Size (in):  44 Cup Size: n/a  Height: 6' (1.829 m)  Weight:  178 lb (80.74 kg)  BMI:  Body mass index is 24.14 kg/(m^2). Tech Comments:  Held Lopressor x 12 hrs    Nuclear Med Study 1 or 2 day study: 1 day  Stress Test Type:  Stress  Reading MD: N/A  Order Authorizing Provider:  Thompson Grayer, MD  Resting Radionuclide: Technetium 53m Sestamibi  Resting Radionuclide Dose: 11.0 mCi   Stress Radionuclide:  Technetium 86m Sestamibi  Stress Radionuclide Dose: 33.0 mCi           Stress Protocol Rest HR: 67 Stress HR: 141  Rest BP: 147/93 Stress BP: 219/105  Exercise Time (min): 5:15 METS: 7.0   Predicted Max HR: 141 bpm % Max HR: 100 bpm Rate Pressure Product: 30879   Dose of Adenosine (mg):  n/a Dose of Lexiscan: n/a mg  Dose of Atropine (mg): n/a Dose of Dobutamine: n/a mcg/kg/min (at max HR)  Stress Test Technologist: Glade Lloyd, BS-ES  Nuclear Technologist:  Earl Many, CNMT     Rest Procedure:  Myocardial perfusion imaging was performed at rest 45 minutes following the intravenous administration of Technetium 87m Sestamibi. Rest ECG: NSR - Normal EKG  Stress Procedure:  The patient exercised on the treadmill utilizing the Bruce Protocol for 5:15 minutes. The patient stopped due to  increased BP and denied any chest pain.  Technetium 37m Sestamibi was injected at peak exercise and myocardial perfusion imaging was performed after a brief delay. Stress ECG: No significant change from baseline ECG  QPS Raw Data Images:  Mild diaphragmatic attenuation.  Normal left ventricular size. Stress Images:  There is mildly decreased uptake in the basal inferior wall. Rest Images:  Comparison with the stress images reveals no significant change. Subtraction (SDS):  There is a fixed inferior defect that is most consistent with diaphragmatic attenuation. Transient Ischemic Dilatation (Normal <1.22):  0.67 Lung/Heart Ratio (Normal <0.45):  0.22  Quantitative Gated Spect Images QGS EDV:  64 ml QGS ESV:  24 ml  Impression Exercise Capacity:  Fair exercise capacity. BP Response:  Hypertensive blood pressure response. Clinical Symptoms:  No significant symptoms noted. ECG Impression:  No significant ST segment change suggestive of ischemia. Comparison with Prior Nuclear Study: No previous nuclear study performed  Overall Impression:  Low risk stress nuclear study with a small diaphragmatic attenuation artifact.  LV Ejection Fraction: 63%.  LV Wall Motion:  NL LV Function; NL Wall Motion   Sanda Klein, MD, [facility] [phone] office [phone] pager

## 2014-11-21 ENCOUNTER — Encounter: Payer: Self-pay | Admitting: Physician Assistant

## 2014-11-22 ENCOUNTER — Telehealth: Payer: Self-pay | Admitting: *Deleted

## 2014-11-22 NOTE — Telephone Encounter (Signed)
pt notified of low risk myoview, EF 63%. Pt asked what this was and I told him EF is the squeezing force of our heart and 63% is good. Pt said thank you and verbalized understanding.

## 2014-11-24 NOTE — Telephone Encounter (Signed)
Will you call and just see how he is doing since his stress test?

## 2014-11-25 DIAGNOSIS — M0589 Other rheumatoid arthritis with rheumatoid factor of multiple sites: Secondary | ICD-10-CM | POA: Diagnosis not present

## 2014-11-25 NOTE — Telephone Encounter (Signed)
S/w pt is doing well. Will [name]

## 2014-12-02 ENCOUNTER — Encounter: Payer: Self-pay | Admitting: Nurse Practitioner

## 2014-12-02 ENCOUNTER — Ambulatory Visit (INDEPENDENT_AMBULATORY_CARE_PROVIDER_SITE_OTHER): Payer: Medicare Other | Admitting: Nurse Practitioner

## 2014-12-02 VITALS — BP 120/80 | HR 78 | Temp 97.4°F | Ht 71.0 in | Wt 185.0 lb

## 2014-12-02 DIAGNOSIS — I25119 Atherosclerotic heart disease of native coronary artery with unspecified angina pectoris: Secondary | ICD-10-CM

## 2014-12-02 DIAGNOSIS — J069 Acute upper respiratory infection, unspecified: Secondary | ICD-10-CM | POA: Diagnosis not present

## 2014-12-02 DIAGNOSIS — B9789 Other viral agents as the cause of diseases classified elsewhere: Principal | ICD-10-CM

## 2014-12-02 NOTE — Patient Instructions (Signed)
You have a cold virus causing your symptoms. The average duration of cold symptoms is 14 days. Start daily sinus rinses (neilmed Sinus Rinse). Tylenol or ibuprophen for headache. Vicks vapor rub under nose to help breathe. Sip fluids every hour. Rest. If you are not feeling better in 10 days or develop fever or chest pain, call us for re-evaluation. Feel better!  Upper Respiratory Infection, Adult An upper respiratory infection (URI) is also sometimes known as the common cold. The upper respiratory tract includes the nose, sinuses, throat, trachea, and bronchi. Bronchi are the airways leading to the lungs. Most people improve within 1 week, but symptoms can last up to 2 weeks. A residual cough may last even longer.  CAUSES Many different viruses can infect the tissues lining the upper respiratory tract. The tissues become irritated and inflamed and often become very moist. Mucus production is also common. A cold is contagious. You can easily spread the virus to others by oral contact. This includes kissing, sharing a glass, coughing, or sneezing. Touching your mouth or nose and then touching a surface, which is then touched by another person, can also spread the virus. SYMPTOMS  Symptoms typically develop 1 to 3 days after you come in contact with a cold virus. Symptoms vary from person to person. They may include:  Runny nose.  Sneezing.  Nasal congestion.  Sinus irritation.  Sore throat.  Loss of voice (laryngitis).  Cough.  Fatigue.  Muscle aches.  Loss of appetite.  Headache.  Low-grade fever. DIAGNOSIS  You might diagnose your own cold based on familiar symptoms, since most people get a cold 2 to 3 times a year. Your caregiver can confirm this based on your exam. Most importantly, your caregiver can check that your symptoms are not due to another disease such as strep throat, sinusitis, pneumonia, asthma, or epiglottitis. Blood tests, throat tests, and X-rays are not necessary  to diagnose a common cold, but they may sometimes be helpful in excluding other more serious diseases. Your caregiver will decide if any further tests are required. RISKS AND COMPLICATIONS  You may be at risk for a more severe case of the common cold if you smoke cigarettes, have chronic heart disease (such as heart failure) or lung disease (such as asthma), or if you have a weakened immune system. The very young and very old are also at risk for more serious infections. Bacterial sinusitis, middle ear infections, and bacterial pneumonia can complicate the common cold. The common cold can worsen asthma and chronic obstructive pulmonary disease (COPD). Sometimes, these complications can require emergency medical care and may be life-threatening. PREVENTION  The best way to protect against getting a cold is to practice good hygiene. Avoid oral or hand contact with people with cold symptoms. Wash your hands often if contact occurs. There is no clear evidence that vitamin C, vitamin E, echinacea, or exercise reduces the chance of developing a cold. However, it is always recommended to get plenty of rest and practice good nutrition. TREATMENT  Treatment is directed at relieving symptoms. There is no cure. Antibiotics are not effective, because the infection is caused by a virus, not by bacteria. Treatment may include:  Increased fluid intake. Sports drinks offer valuable electrolytes, sugars, and fluids.  Breathing heated mist or steam (vaporizer or shower).  Eating chicken soup or other clear broths, and maintaining good nutrition.  Getting plenty of rest.  Using gargles or lozenges for comfort.  Controlling fevers with ibuprofen or acetaminophen as directed  by your caregiver.  Increasing usage of your inhaler if you have asthma. Zinc gel and zinc lozenges, taken in the first 24 hours of the common cold, can shorten the duration and lessen the severity of symptoms. Pain medicines may help with  fever, muscle aches, and throat pain. A variety of non-prescription medicines are available to treat congestion and runny nose. Your caregiver can make recommendations and may suggest nasal or lung inhalers for other symptoms.  HOME CARE INSTRUCTIONS   Only take over-the-counter or prescription medicines for pain, discomfort, or fever as directed by your caregiver.  Use a warm mist humidifier or inhale steam from a shower to increase air moisture. This may keep secretions moist and make it easier to breathe.  Drink enough water and fluids to keep your urine clear or pale yellow.  Rest as needed.  Return to work when your temperature has returned to normal or as your caregiver advises. You may need to stay home longer to avoid infecting others. You can also use a face mask and careful hand washing to prevent spread of the virus. SEEK MEDICAL CARE IF:   After the first few days, you feel you are getting worse rather than better.  You need your caregiver's advice about medicines to control symptoms.  You develop chills, worsening shortness of breath, or brown or red sputum. These may be signs of pneumonia.  You develop yellow or brown nasal discharge or pain in the face, especially when you bend forward. These may be signs of sinusitis.  You develop a fever, swollen neck glands, pain with swallowing, or white areas in the back of your throat. These may be signs of strep throat. SEEK IMMEDIATE MEDICAL CARE IF:   You have a fever.  You develop severe or persistent headache, ear pain, sinus pain, or chest pain.  You develop wheezing, a prolonged cough, cough up blood, or have a change in your usual mucus (if you have chronic lung disease).  You develop sore muscles or a stiff neck. Document Released: 04/27/2001 Document Revised: 01/24/2012 Document Reviewed: 03/05/2011 [facility] Patient Information 2014 [place], [place].

## 2014-12-02 NOTE — Progress Notes (Signed)
Pre visit review using our clinic review tool, if applicable. No additional management support is needed unless otherwise documented below in the visit note. 

## 2014-12-03 NOTE — Progress Notes (Signed)
   Subjective:    Patient ID: [name], male    DOB: Jun 01, 1933, 81 y.o.   MRN: [id number]  Sinusitis This is a new problem. The current episode started 1 to 4 weeks ago (1 wk). The problem has been gradually improving since onset. There has been no fever. He is experiencing no pain. Associated symptoms include congestion (nasal) and coughing. Pertinent negatives include no chills, ear pain, headaches, hoarse voice, shortness of breath, sinus pressure, sneezing, sore throat or swollen glands. Treatments tried: antihistamine. The treatment provided no relief.      Review of Systems  Constitutional: Positive for fatigue. Negative for fever and chills.  HENT: Positive for congestion (nasal) and postnasal drip. Negative for ear pain, hoarse voice, sinus pressure, sneezing, sore throat and voice change.   Respiratory: Positive for cough. Negative for chest tightness, shortness of breath and wheezing.   Cardiovascular: Negative for chest pain.  Musculoskeletal: Negative for back pain.  Neurological: Negative for headaches.       Objective:   Physical Exam  Constitutional: He is oriented to person, place, and time. He appears well-developed and well-nourished. No distress.  HENT:  Head: Normocephalic and atraumatic.  Right Ear: External ear normal.  Left Ear: External ear normal.  Mouth/Throat: Oropharynx is clear and moist. No oropharyngeal exudate.  Nasal quality to voice   Eyes: Conjunctivae are normal. Right eye exhibits no discharge. Left eye exhibits no discharge.  Neck: Normal range of motion. Neck supple. No thyromegaly present.  Cardiovascular: Normal rate, regular rhythm and normal heart sounds.   No murmur heard. Pulmonary/Chest: Effort normal and breath sounds normal. No respiratory distress. He has no wheezes. He has no rales.  Lymphadenopathy:    He has no cervical adenopathy.  Neurological: He is alert and oriented to person, place, and time.  Skin: Skin is warm and  dry.  Psychiatric: He has a normal mood and affect. His behavior is normal. Thought content normal.  Vitals reviewed.         Assessment & Plan:  1. Viral upper respiratory tract infection with cough Symptom management Daily sinus rinse See pt instructions F/u prn

## 2014-12-05 ENCOUNTER — Ambulatory Visit: Payer: Medicare Other | Admitting: Pharmacist

## 2014-12-06 ENCOUNTER — Other Ambulatory Visit: Payer: Medicare Other

## 2014-12-09 ENCOUNTER — Other Ambulatory Visit: Payer: Medicare Other

## 2014-12-09 ENCOUNTER — Other Ambulatory Visit: Payer: Self-pay | Admitting: Internal Medicine

## 2014-12-10 ENCOUNTER — Other Ambulatory Visit (INDEPENDENT_AMBULATORY_CARE_PROVIDER_SITE_OTHER): Payer: Medicare Other | Admitting: *Deleted

## 2014-12-10 DIAGNOSIS — E782 Mixed hyperlipidemia: Secondary | ICD-10-CM

## 2014-12-10 LAB — HEPATIC FUNCTION PANEL
ALT: 27 U/L (ref 0–53)
AST: 25 U/L (ref 0–37)
Albumin: 4.1 g/dL (ref 3.5–5.2)
Alkaline Phosphatase: 46 U/L (ref 39–117)
Bilirubin, Direct: 0.2 mg/dL (ref 0.0–0.3)
Total Bilirubin: 0.9 mg/dL (ref 0.2–1.2)
Total Protein: 7.2 g/dL (ref 6.0–8.3)

## 2014-12-10 LAB — LIPID PANEL
Cholesterol: 145 mg/dL (ref 0–200)
HDL: 53.1 mg/dL (ref >39.00)
LDL Cholesterol: 58 mg/dL (ref 0–99)
NonHDL: 91.9
Total CHOL/HDL Ratio: 3
Triglycerides: 168 mg/dL — ABNORMAL HIGH (ref 0.0–149.0)
VLDL: 33.6 mg/dL (ref 0.0–40.0)

## 2014-12-12 ENCOUNTER — Telehealth: Payer: Self-pay | Admitting: Pharmacist Clinician (PhC)/ Clinical Pharmacy Specialist

## 2014-12-12 DIAGNOSIS — E782 Mixed hyperlipidemia: Secondary | ICD-10-CM

## 2014-12-12 NOTE — Telephone Encounter (Signed)
-----   Message from Thompson Grayer, MD sent at 12/10/2014  7:46 PM EST ----- Please discuss with patient.

## 2014-12-16 NOTE — Telephone Encounter (Signed)
Left message that labs look good, will repeat in 6 months.  Pt can view in MyChart

## 2014-12-25 DIAGNOSIS — M0589 Other rheumatoid arthritis with rheumatoid factor of multiple sites: Secondary | ICD-10-CM | POA: Diagnosis not present

## 2014-12-26 ENCOUNTER — Other Ambulatory Visit: Payer: Self-pay

## 2014-12-26 ENCOUNTER — Encounter: Payer: Self-pay | Admitting: Internal Medicine

## 2014-12-26 ENCOUNTER — Ambulatory Visit (INDEPENDENT_AMBULATORY_CARE_PROVIDER_SITE_OTHER): Payer: Medicare Other | Admitting: Internal Medicine

## 2014-12-26 ENCOUNTER — Ambulatory Visit (INDEPENDENT_AMBULATORY_CARE_PROVIDER_SITE_OTHER)
Admission: RE | Admit: 2014-12-26 | Discharge: 2014-12-26 | Disposition: A | Discharge status: Home / Self Care | Payer: Medicare Other | Source: Ambulatory Visit | Attending: Internal Medicine | Admitting: Internal Medicine

## 2014-12-26 VITALS — BP 128/78 | HR 76 | Temp 97.7°F | Ht 71.0 in | Wt 186.8 lb

## 2014-12-26 DIAGNOSIS — R05 Cough: Secondary | ICD-10-CM | POA: Diagnosis not present

## 2014-12-26 DIAGNOSIS — J3089 Other allergic rhinitis: Secondary | ICD-10-CM | POA: Diagnosis not present

## 2014-12-26 DIAGNOSIS — I25119 Atherosclerotic heart disease of native coronary artery with unspecified angina pectoris: Secondary | ICD-10-CM

## 2014-12-26 DIAGNOSIS — R059 Cough, unspecified: Secondary | ICD-10-CM

## 2014-12-26 MED ORDER — AZITHROMYCIN 250 MG PO TABS: ORAL_TABLET | ORAL | Status: DC

## 2014-12-26 MED ORDER — BENZONATATE 200 MG PO CAPS: 200.0000 mg | ORAL_CAPSULE | Freq: Three times a day (TID) | ORAL | Status: DC | PRN

## 2014-12-26 MED ORDER — METOPROLOL TARTRATE 25 MG PO TABS: 25.0000 mg | ORAL_TABLET | Freq: Two times a day (BID) | ORAL | Status: DC

## 2014-12-26 NOTE — Patient Instructions (Addendum)
Plain Mucinex (NOT D) for thick secretions ;force NON dairy fluids .   Nasal cleansing in the shower as discussed with lather of mild shampoo.After 10 seconds wash off lather while  exhaling through nostrils. Make sure that all residual soap is removed to prevent irritation.  Flonase OR Nasacort AQ 1 spray in each nostril twice a day as needed. Use the "crossover" technique into opposite nostril spraying toward opposite ear @ 45 degree angle, not straight up into nostril.  Plain Allegra (NOT D )  160 daily , Loratidine 10 mg , OR Zyrtec 10 mg @ bedtime  as needed for itchy eyes & sneezing.  Reflux of gastric acid may be asymptomatic as this may occur mainly during sleep.The triggers for reflux  include stress; the "aspirin family" ; alcohol; peppermint; and caffeine (coffee, tea, cola, and chocolate). The aspirin family would include aspirin and the nonsteroidal agents such as ibuprofen &  Naproxen. Tylenol would not cause reflux. If having symptoms ; food & drink should be avoided for @ least 2 hours before going to bed.    Your next office appointment will be determined based upon review of your pending  x-rays. Those instructions will be transmitted to you through My Chart  Critical values will be called. Followup as needed for any active or acute issue. Please report any significant change in your symptoms.

## 2014-12-26 NOTE — Progress Notes (Signed)
   Subjective:    Patient ID: [name], male    DOB: 02-Feb-1933, 81 y.o.   MRN: [id number]  HPI  He describes a cough with production of clear sputum for approximately one month. This preceded his recent trip to [place]. He's been back home 3 days. The cough can awaken him at night. He's been taking Halls cough drops with some benefit. There is no associated pleurisy. He does have history of allergies and this did exacerbate while in [place]. Loratadine did help those symptoms  He's had some sweats and has had intermittent frontal sinus discomfort as well as itchy, watery eyes.  He is on lisinopril; he does not relate his cough to this  He has a history of GERD but he feels this is quiescent.  Review of Systems Facial pain , nasal purulence, dental pain, sore throat , otic pain or otic discharge denied. No fever , chills or sweats.    Objective:   Physical Exam  Positive or pertinent findings include: He has bilateral hearing aids. There is minimal erythema of the nasal septum. An S4 is present.  General appearance:Adequately nourished; no acute distress or increased work of breathing is present.  No  lymphadenopathy about the head, neck, or axilla noted.  Eyes: No conjunctival inflammation or lid edema is present. There is no scleral icterus. Ears:  External ear exam shows no significant lesions or deformities.  Otoscopic examination reveals clear canals, tympanic membranes are intact bilaterally without bulging, retraction, inflammation or discharge. Nose:  External nasal examination shows no deformity or inflammation.  No septal dislocation or deviation.No obstruction to airflow.  Oral exam: Dental hygiene is good; lips and gums are healthy appearing.There is no oropharyngeal erythema or exudate noted.  Neck:  No deformities, thyromegaly, masses, or tenderness noted.   Supple with full range of motion without pain.  Heart:  Normal rate and regular rhythm. S1 and S2 normal  without gallop, murmur, click, or rub  Lungs:Chest clear to auscultation; no wheezes, rhonchi,rales ,or rubs present. Extremities:  No cyanosis, edema, or clubbing  noted  Skin: Warm & dry w/o jaundice or tenting.     Assessment & Plan:  #1 allergic rhinitis  #2 cough ;R/O atypical bronchitis. On ACE-I See orders & AVS

## 2014-12-26 NOTE — Progress Notes (Signed)
Pre visit review using our clinic review tool, if applicable. No additional management support is needed unless otherwise documented below in the visit note. 

## 2014-12-27 ENCOUNTER — Other Ambulatory Visit: Payer: Self-pay | Admitting: Internal Medicine

## 2014-12-27 DIAGNOSIS — J189 Pneumonia, unspecified organism: Secondary | ICD-10-CM

## 2015-01-02 ENCOUNTER — Encounter: Payer: Self-pay | Admitting: Internal Medicine

## 2015-01-02 ENCOUNTER — Ambulatory Visit (INDEPENDENT_AMBULATORY_CARE_PROVIDER_SITE_OTHER)
Admission: RE | Admit: 2015-01-02 | Discharge: 2015-01-02 | Disposition: A | Discharge status: Home / Self Care | Payer: Medicare Other | Source: Ambulatory Visit | Attending: Internal Medicine | Admitting: Internal Medicine

## 2015-01-02 ENCOUNTER — Other Ambulatory Visit: Payer: Self-pay | Admitting: Dermatology

## 2015-01-02 ENCOUNTER — Ambulatory Visit (INDEPENDENT_AMBULATORY_CARE_PROVIDER_SITE_OTHER): Payer: Medicare Other | Admitting: Internal Medicine

## 2015-01-02 VITALS — BP 142/84 | HR 69 | Temp 98.1°F | Resp 13 | Ht 71.0 in | Wt 184.8 lb

## 2015-01-02 DIAGNOSIS — J189 Pneumonia, unspecified organism: Secondary | ICD-10-CM | POA: Diagnosis not present

## 2015-01-02 DIAGNOSIS — I25119 Atherosclerotic heart disease of native coronary artery with unspecified angina pectoris: Secondary | ICD-10-CM

## 2015-01-02 DIAGNOSIS — L812 Freckles: Secondary | ICD-10-CM | POA: Diagnosis not present

## 2015-01-02 DIAGNOSIS — L720 Epidermal cyst: Secondary | ICD-10-CM | POA: Diagnosis not present

## 2015-01-02 DIAGNOSIS — L821 Other seborrheic keratosis: Secondary | ICD-10-CM | POA: Diagnosis not present

## 2015-01-02 DIAGNOSIS — L570 Actinic keratosis: Secondary | ICD-10-CM | POA: Diagnosis not present

## 2015-01-02 DIAGNOSIS — D485 Neoplasm of uncertain behavior of skin: Secondary | ICD-10-CM | POA: Diagnosis not present

## 2015-01-02 DIAGNOSIS — Z85828 Personal history of other malignant neoplasm of skin: Secondary | ICD-10-CM | POA: Diagnosis not present

## 2015-01-02 NOTE — Progress Notes (Signed)
   Subjective:    Patient ID: [name], male    DOB: [birth year], 81 y.o.   MRN: [id number]  HPI   He returns for follow-up of the right middle lobe community-acquired pneumonia for which he was seen 12/26/14. He completed the course of Zithromax 2/16.  He denies any fever, chills, sweats,dyspnea, wheezing sputum production, or hemoptysis. He continues to have some cough but it is significant improved and dry.In fact he has had no cough today. He is on an ACE-I.  CXrays 2/11 & 2/18 reviewed & discussed. RML essentially resolved. Review of Systems Frontal headache, facial pain , nasal purulence, dental pain, sore throat , otic pain or otic discharge denied.     Objective:   Physical Exam  General appearance:Adequately nourished; no acute distress or increased work of breathing is present.  No  lymphadenopathy about the head, neck, or axilla noted.   Eyes: No conjunctival inflammation or lid edema is present. There is no scleral icterus.  Ears:  External ear exam shows no significant lesions or deformities. Hearing aids bilaterally.  Nose:  External nasal examination shows no deformity or inflammation. Nasal mucosa are pink and moist without lesions or exudates. No septal dislocation or deviation.No obstruction to airflow.   Oral exam: Dental hygiene is good; lips and gums are healthy appearing.There is no oropharyngeal erythema or exudate noted.   Neck:  No deformities, thyromegaly, masses, or tenderness noted.   Supple with full range of motion without pain.   Heart:  Normal rate and regular rhythm. S1 and S2 normal without gallop, murmur, click, rub or other extra sounds.   Lungs:Chest clear to auscultation; no wheezes, rhonchi,rales ,or rubs present.  Extremities:  No cyanosis, edema, or clubbing  noted    Skin: Warm & dry w/o jaundice or tenting.       Assessment & Plan:  #1 CAP resolved Plan: If cough persists change Lisinopril to Losartan

## 2015-01-02 NOTE — Progress Notes (Signed)
Pre visit review using our clinic review tool, if applicable. No additional management support is needed unless otherwise documented below in the visit note. 

## 2015-01-03 NOTE — Patient Instructions (Addendum)
Report any significant cough  with the ACE-I BP blood pressure medication , Lisinopril. It will be changed if cough persists.

## 2015-01-07 DIAGNOSIS — H26492 Other secondary cataract, left eye: Secondary | ICD-10-CM | POA: Diagnosis not present

## 2015-01-07 DIAGNOSIS — H2640 Unspecified secondary cataract: Secondary | ICD-10-CM | POA: Diagnosis not present

## 2015-01-11 ENCOUNTER — Other Ambulatory Visit: Payer: Self-pay | Admitting: Internal Medicine

## 2015-01-22 DIAGNOSIS — M0589 Other rheumatoid arthritis with rheumatoid factor of multiple sites: Secondary | ICD-10-CM | POA: Diagnosis not present

## 2015-01-24 ENCOUNTER — Ambulatory Visit: Payer: Medicare Other | Admitting: Internal Medicine

## 2015-01-29 ENCOUNTER — Other Ambulatory Visit: Payer: Self-pay

## 2015-01-29 MED ORDER — TICAGRELOR 90 MG PO TABS: 90.0000 mg | ORAL_TABLET | Freq: Two times a day (BID) | ORAL | Status: DC

## 2015-02-12 ENCOUNTER — Other Ambulatory Visit: Payer: Self-pay | Admitting: Internal Medicine

## 2015-02-14 DIAGNOSIS — I480 Paroxysmal atrial fibrillation: Secondary | ICD-10-CM

## 2015-02-14 HISTORY — DX: Paroxysmal atrial fibrillation: I48.0

## 2015-02-18 ENCOUNTER — Other Ambulatory Visit: Payer: Self-pay | Admitting: Internal Medicine

## 2015-02-18 DIAGNOSIS — H26491 Other secondary cataract, right eye: Secondary | ICD-10-CM | POA: Diagnosis not present

## 2015-02-19 ENCOUNTER — Ambulatory Visit (INDEPENDENT_AMBULATORY_CARE_PROVIDER_SITE_OTHER): Payer: Medicare Other | Admitting: Internal Medicine

## 2015-02-19 ENCOUNTER — Encounter: Payer: Self-pay | Admitting: Internal Medicine

## 2015-02-19 ENCOUNTER — Emergency Department (HOSPITAL_COMMUNITY)
Admission: EM | Admit: 2015-02-19 | Discharge: 2015-02-20 | Disposition: A | Discharge status: Home / Self Care | Payer: Medicare Other | Attending: Emergency Medicine | Admitting: Emergency Medicine

## 2015-02-19 VITALS — BP 132/70 | HR 63 | Ht 72.0 in | Wt 184.8 lb

## 2015-02-19 DIAGNOSIS — Z86010 Personal history of colonic polyps: Secondary | ICD-10-CM | POA: Diagnosis not present

## 2015-02-19 DIAGNOSIS — M1990 Unspecified osteoarthritis, unspecified site: Secondary | ICD-10-CM | POA: Insufficient documentation

## 2015-02-19 DIAGNOSIS — K219 Gastro-esophageal reflux disease without esophagitis: Secondary | ICD-10-CM | POA: Diagnosis not present

## 2015-02-19 DIAGNOSIS — R0789 Other chest pain: Secondary | ICD-10-CM | POA: Insufficient documentation

## 2015-02-19 DIAGNOSIS — Z79899 Other long term (current) drug therapy: Secondary | ICD-10-CM | POA: Insufficient documentation

## 2015-02-19 DIAGNOSIS — I252 Old myocardial infarction: Secondary | ICD-10-CM | POA: Insufficient documentation

## 2015-02-19 DIAGNOSIS — I2510 Atherosclerotic heart disease of native coronary artery without angina pectoris: Secondary | ICD-10-CM | POA: Insufficient documentation

## 2015-02-19 DIAGNOSIS — I4891 Unspecified atrial fibrillation: Secondary | ICD-10-CM

## 2015-02-19 DIAGNOSIS — R079 Chest pain, unspecified: Secondary | ICD-10-CM

## 2015-02-19 DIAGNOSIS — Z87891 Personal history of nicotine dependence: Secondary | ICD-10-CM | POA: Insufficient documentation

## 2015-02-19 DIAGNOSIS — G43909 Migraine, unspecified, not intractable, without status migrainosus: Secondary | ICD-10-CM | POA: Insufficient documentation

## 2015-02-19 DIAGNOSIS — Z9889 Other specified postprocedural states: Secondary | ICD-10-CM | POA: Diagnosis not present

## 2015-02-19 DIAGNOSIS — Z7952 Long term (current) use of systemic steroids: Secondary | ICD-10-CM | POA: Diagnosis not present

## 2015-02-19 DIAGNOSIS — I25119 Atherosclerotic heart disease of native coronary artery with unspecified angina pectoris: Secondary | ICD-10-CM

## 2015-02-19 DIAGNOSIS — Z7982 Long term (current) use of aspirin: Secondary | ICD-10-CM | POA: Diagnosis not present

## 2015-02-19 DIAGNOSIS — I10 Essential (primary) hypertension: Secondary | ICD-10-CM

## 2015-02-19 MED ORDER — TICAGRELOR 60 MG PO TABS: 60.0000 mg | ORAL_TABLET | Freq: Two times a day (BID) | ORAL | Status: DC

## 2015-02-19 NOTE — ED Notes (Signed)
Per EMS, pt from home. Pt went to bed around 2100 and states he felt funny. He woke up with 3/10 RT chest pressure radiating to jaw and RT arm. Pt states he felt "lightheaded, dizzy, and palpitations. Per EMS, EKG shows Afib. Pt has no prior hx of A fib, but has had irregular heartbeat. Hx of MI with stent placement in Feb 2015. Pt took 2 Nitro PTA with relief. Pt denies any symptoms at this time. Pt took 81 mg ASA and is on Brilenta. EMS gave pt 10 mg bolus of cardizem PTA due to HR in 140s. HR 89 in triage. NAD noted VSS.

## 2015-02-19 NOTE — Progress Notes (Signed)
PCP: [name], MD  [name] is a 81 y.o. male who presents today for routine cardiology followup.  Since last being seen, the patient reports doing very well.  He has had no further chest discomfort since he was seen in the ER several months ago.  His low risk myoview is reviewed with him today.  His primary concern is with bruising/ bleeding with brilinta.  This is exacerbated by chronic steroid use.  Today, he denies symptoms of palpitations, chest pain, shortness of breath,  lower extremity edema, dizziness, presyncope, or syncope.  The patient is otherwise without complaint today.   Past Medical History  Diagnosis Date  . Colon polyp   . Hyperlipidemia   . Osteoarthritis   . Migraine with aura   . RA (rheumatoid arthritis)   . Premature atrial contractions   . GERD (gastroesophageal reflux disease)   . Hypertension   . CAD (coronary artery disease) 12/2013    s/p inferior STEMI 01/08/2014; LHC 01/08/14: total RCA occlusion s/p 3.5x31mm Xience DES distal RCA and 3.5x28 mm DES mid RCA, 60-70% mid LAD stenosis, EF 55%  . Palpitations   . STEMI (ST elevation myocardial infarction)   . Hx of echocardiogram     ETT- Myoview (1/16):  diaph atten, EF 63%, Low Risk   Past Surgical History  Procedure Laterality Date  . Back surgery  2004  . Left heart catheterization with coronary angiogram N/A 01/08/2014    Procedure: LEFT HEART CATHETERIZATION WITH CORONARY ANGIOGRAM;  Surgeon: Troy Sine, MD;  Location: [facility] CATH LAB;  Service: Cardiovascular;  Laterality: N/A;  . Percutaneous coronary stent intervention (pci-s)  01/08/2014    Procedure: PERCUTANEOUS CORONARY STENT INTERVENTION (PCI-S);  Surgeon: Troy Sine, MD;  Location: [facility] CATH LAB;  Service: Cardiovascular;;   ROS- all systems are reviewed and negative except as per HPI In addition, he has RA which is stable presently but limits prednisone reduction  Current Outpatient Prescriptions  Medication Sig Dispense Refill  .  Abatacept (ORENCIA [place]) Inject 1 Applicatorful into the skin See admin instructions. Every 4 weeks    . aspirin 81 MG tablet Take 81 mg by mouth daily.    . Cholecalciferol (VITAMIN D) 1000 UNITS capsule Take 1,000 Units by mouth daily.     . CRESTOR 10 MG tablet TAKE ONE TABLET EACH DAY (Patient taking differently: TAKE ONE TABLET BY MOUTH EACH DAY) 30 tablet 3  . Cyanocobalamin 2500 MCG SUBL Place 1 tablet under the tongue daily.     . DULoxetine (CYMBALTA) 60 MG capsule TAKE ONE CAPSULE EACH DAY (Patient taking differently: TAKE ONE CAPSULE BY MOUTH EACH DAY) 30 capsule 5  . ezetimibe (ZETIA) 10 MG tablet Take 5 mg by mouth daily.     [name] lisinopril (PRINIVIL,ZESTRIL) 5 MG tablet Take 1 tablet (5 mg total) by mouth daily. 90 tablet 3  . LORazepam (ATIVAN) 0.5 MG tablet TAKE 1 TO 2 TABLETS TWICE A DAY AS NEEDED FOR ANXIETY (Patient taking differently: TAKE 1 TO 2 TABLETS BY MOUTH TWICE A DAY AS NEEDED FOR ANXIETY) 60 tablet 5  . metoprolol tartrate (LOPRESSOR) 25 MG tablet Take 1 tablet (25 mg total) by mouth 2 (two) times daily. 60 tablet 4  . nitroGLYCERIN (NITROSTAT) 0.4 MG SL tablet Place 1 tablet (0.4 mg total) under the tongue every 5 (five) minutes x 3 doses as needed for chest pain. (Patient taking differently: Place 0.4 mg under the tongue every 5 (five) minutes x 3 doses  as needed for chest pain (MAX 3 TABLETS). ) 30 tablet 12  . omeprazole (PRILOSEC) 40 MG capsule Take 40 mg by mouth daily.    [name] oxyCODONE-acetaminophen (PERCOCET/ROXICET) 5-325 MG per tablet Take 1 tablet by mouth every 6 (six) hours as needed for severe pain. 100 tablet 0  . predniSONE (DELTASONE) 1 MG tablet Take 3 mg by mouth daily with breakfast.     . Propylene Glycol (SYSTANE BALANCE OP) Apply 1 drop to eye daily as needed (dry eyeS).     [name] zolpidem (AMBIEN) 10 MG tablet Take 5 mg by mouth at bedtime as needed for sleep.     No current facility-administered medications for this visit.    Physical Exam: Filed  Vitals:   02/19/15 1048  BP: 132/70  Pulse: 63  Height: 6' (1.829 m)  Weight: 184 lb 12.8 oz (83.825 kg)    GEN- The patient is well appearing, alert and oriented x 3 today.   Head- normocephalic, atraumatic Eyes-  Sclera clear, conjunctiva pink Ears- hearing intact Oropharynx- clear Lungs- Clear to ausculation bilaterally, normal work of breathing Heart- Regular rate and rhythm, no murmurs, rubs or gallops, PMI not laterally displaced GI- soft, NT, ND, + BS Extremities- no clubbing, cyanosis, or edema  ekg today reveals sinus rhythm 63 bpm, otherwise normal ekg Myoview is reviewed with patient today  Assessment and Plan:  1. Cad Doing well  No ischemic symptoms Today, I have reduced brilinta to 60mg  BID based on [place] trial data. If his bleeding/ bruising does not improve, would stop brilinta in the future.  I discussed this strategy with Dr Claiborne Billings (who performed initial stent 2/15) who agrees with this approach.  2. htn Stable No change required today  3. HL Stable No change required today Followed in lipid clinic  4. pacs Well controlled  Return in 6 months to see [name] I will see again in a year

## 2015-02-19 NOTE — ED Provider Notes (Signed)
CSN: [id number]     Arrival date & time 02/19/15  2348 History   First MD Initiated Contact with Patient 02/19/15 2349     This chart was scribed for Evan Fuel, MD by [name], ED Scribe. This patient was seen in room A02C/A02C and the patient's care was started 12:18 AM.   Chief Complaint  Patient presents with  . Chest Pain  . Palpitations   The history is provided by the patient. No language interpreter was used.    HPI Comments: [name] brought in by EMS here with his wife is a 81 y.o. male with a PMHx of hyperlipemia, osteoarthritis, GERD, HTN, CAD, and STEMI who presents to the Emergency Department complaining of constant, sudden onset R sided chest pain  x  3 hours. Pain was described as pressure and rated 3/10. However, he is currently pain free. Pt also reports tingling to the R arm/R shoulder lasting 5-6 seconds. [name] also mentions lightheadedness, dizziness, and palpitations at time of onset of chest pain. Pt took 2 doses of Nitro prior to arrival with improvement for symptoms. No SOB, nausea, or diaphoresis. Pt had follow up with Dr. Thompson Todd this morning without any issues or acute abnormal findings on exam. During visit, [name] dosage was decreased. Last Myoview January 2016 without any abnormal findings. PSHx includes percutaneous coronary stent placement performed 01/08/2014. Pt with known allergies to Methotrexate, Atorvastatin, and Pravastatin sodium.  Past Medical History  Diagnosis Date  . Colon polyp   . Hyperlipidemia   . Osteoarthritis   . Migraine with aura   . RA (rheumatoid arthritis)   . Premature atrial contractions   . GERD (gastroesophageal reflux disease)   . Hypertension   . CAD (coronary artery disease) 12/2013    s/p inferior STEMI 01/08/2014; LHC 01/08/14: total RCA occlusion s/p 3.5x29mm Xience DES distal RCA and 3.5x28 mm DES mid RCA, 60-70% mid LAD stenosis, EF 55%  . Palpitations   . STEMI (ST elevation myocardial infarction)   .  Hx of echocardiogram     ETT- Myoview (1/16):  diaph atten, EF 63%, Low Risk   Past Surgical History  Procedure Laterality Date  . Back surgery  2004  . Left heart catheterization with coronary angiogram N/A 01/08/2014    Procedure: LEFT HEART CATHETERIZATION WITH CORONARY ANGIOGRAM;  Surgeon: Evan Sine, MD;  Location: [facility] CATH LAB;  Service: Cardiovascular;  Laterality: N/A;  . Percutaneous coronary stent intervention (pci-s)  01/08/2014    Procedure: PERCUTANEOUS CORONARY STENT INTERVENTION (PCI-S);  Surgeon: Evan Sine, MD;  Location: [facility] CATH LAB;  Service: Cardiovascular;;   Family History  Problem Relation Age of Onset  . Colon cancer Neg Hx   . Hypertension Mother   . Heart disease Father    History  Substance Use Topics  . Smoking status: Former Research scientist (life sciences)  . Smokeless tobacco: Never Used     Comment: Quit 50 yrs ago (as of 2015)  . Alcohol Use: 4.2 - 8.4 oz/week    7-14 Glasses of wine per week     Comment: 1-2 glasses of wine nightly    Review of Systems  Cardiovascular: Positive for chest pain and palpitations.  Musculoskeletal: Positive for arthralgias.  Neurological: Positive for dizziness and light-headedness.  All other systems reviewed and are negative.     Allergies  Methotrexate; Atorvastatin; and Pravastatin sodium  Home Medications   Prior to Admission medications   Medication Sig Start Date End Date Taking? Authorizing Provider  Abatacept (ORENCIA Bushnell) Inject 1 Applicatorful into the skin See admin instructions. Every 4 weeks    Historical Provider, MD  aspirin 81 MG tablet Take 81 mg by mouth daily.    Historical Provider, MD  Cholecalciferol (VITAMIN D) 1000 UNITS capsule Take 1,000 Units by mouth daily.     Historical Provider, MD  CRESTOR 10 MG tablet TAKE ONE TABLET EACH DAY Patient taking differently: TAKE ONE TABLET BY MOUTH EACH DAY 01/13/15   Evan Grayer, MD  Cyanocobalamin 2500 MCG SUBL Place 1 tablet under the tongue daily.      Historical Provider, MD  DULoxetine (CYMBALTA) 60 MG capsule TAKE ONE CAPSULE EACH DAY Patient taking differently: TAKE ONE CAPSULE BY MOUTH EACH DAY 02/12/15   Evan Plotnikov V, MD  ezetimibe (ZETIA) 10 MG tablet Take 5 mg by mouth daily.  02/07/14   Evan Grayer, MD  lisinopril (PRINIVIL,ZESTRIL) 5 MG tablet Take 1 tablet (5 mg total) by mouth daily. 02/25/14   Evan Grayer, MD  LORazepam (ATIVAN) 0.5 MG tablet TAKE 1 TO 2 TABLETS TWICE A DAY AS NEEDED FOR ANXIETY Patient taking differently: TAKE 1 TO 2 TABLETS BY MOUTH TWICE A DAY AS NEEDED FOR ANXIETY 02/18/15   Evan Plotnikov V, MD  metoprolol tartrate (LOPRESSOR) 25 MG tablet Take 1 tablet (25 mg total) by mouth 2 (two) times daily. 12/26/14   Evan Grayer, MD  nitroGLYCERIN (NITROSTAT) 0.4 MG SL tablet Place 1 tablet (0.4 mg total) under the tongue every 5 (five) minutes x 3 doses as needed for chest pain. Patient taking differently: Place 0.4 mg under the tongue every 5 (five) minutes x 3 doses as needed for chest pain (MAX 3 TABLETS).  01/10/14   Evan O Edmisten, PA-C  omeprazole (PRILOSEC) 40 MG capsule Take 40 mg by mouth daily.    Historical Provider, MD  oxyCODONE-acetaminophen (PERCOCET/ROXICET) 5-325 MG per tablet Take 1 tablet by mouth every 6 (six) hours as needed for severe pain. 09/25/14   Evan Plotnikov V, MD  predniSONE (DELTASONE) 1 MG tablet Take 3 mg by mouth daily with breakfast.     Historical Provider, MD  Propylene Glycol (SYSTANE BALANCE OP) Apply 1 drop to eye daily as needed (dry eyeS).     Historical Provider, MD  ticagrelor (BRILINTA) 60 MG TABS tablet Take 1 tablet (60 mg total) by mouth 2 (two) times daily. 02/19/15   Evan Grayer, MD  zolpidem (AMBIEN) 10 MG tablet Take 5 mg by mouth at bedtime as needed for sleep. 05/13/14   Evan Plotnikov V, MD   Triage Vitals: BP 106/78 mmHg  Pulse 82  Temp(Src) 97.8 F (36.6 C) (Oral)  Resp 17  Ht 6' (1.829 m)  Wt 180 lb (81.647 kg)  BMI 24.41 kg/m2  SpO2 96%    Physical Exam  Constitutional: He is oriented to person, place, and time. He appears well-developed and well-nourished.  HENT:  Head: Normocephalic and atraumatic.  Eyes: EOM are normal.  Neck: Normal range of motion.  Cardiovascular: Normal rate, normal heart sounds and intact distal pulses.  An irregular rhythm present.  Pulmonary/Chest: Effort normal and breath sounds normal. No respiratory distress.  Abdominal: Soft. He exhibits no distension. There is no tenderness.  Musculoskeletal: Normal range of motion.  Neurological: He is alert and oriented to person, place, and time.  Skin: Skin is warm and dry.  Psychiatric: He has a normal mood and affect. Judgment normal.  Nursing note and vitals reviewed.   ED Course  Procedures (  including critical care time)  DIAGNOSTIC STUDIES: Oxygen Saturation is 96% on RA, adequate by my interpretation.    COORDINATION OF CARE: 11:58 PM-Discussed treatment plan with pt at bedside and pt agreed to plan.     Labs Review Results for orders placed or performed during the hospital encounter of 24/09/73  Basic metabolic panel  Result Value Ref Range   Sodium 140 135 - 145 mmol/L   Potassium 3.9 3.5 - 5.1 mmol/L   Chloride 109 96 - 112 mmol/L   CO2 25 19 - 32 mmol/L   Glucose, Bld 118 (H) 70 - 99 mg/dL   BUN 17 6 - 23 mg/dL   Creatinine, Ser 1.02 0.50 - 1.35 mg/dL   Calcium 9.0 8.4 - 10.5 mg/dL   GFR calc non Af Amer 68 (L) >90 mL/min   GFR calc Af Amer 79 (L) >90 mL/min   Anion gap 6 5 - 15  Troponin I  Result Value Ref Range   Troponin I <0.03 <0.031 ng/mL  CBC with Differential  Result Value Ref Range   WBC 6.0 4.0 - 10.5 K/uL   RBC 3.88 (L) 4.22 - 5.81 MIL/uL   Hemoglobin 12.8 (L) 13.0 - 17.0 g/dL   HCT 38.0 (L) 39.0 - 52.0 %   MCV 97.9 78.0 - 100.0 fL   MCH 33.0 26.0 - 34.0 pg   MCHC 33.7 30.0 - 36.0 g/dL   RDW 13.8 11.5 - 15.5 %   Platelets 234 150 - 400 K/uL   Neutrophils Relative % 51 43 - 77 %   Neutro Abs 3.1 1.7 -  7.7 K/uL   Lymphocytes Relative 41 12 - 46 %   Lymphs Abs 2.5 0.7 - 4.0 K/uL   Monocytes Relative 7 3 - 12 %   Monocytes Absolute 0.4 0.1 - 1.0 K/uL   Eosinophils Relative 1 0 - 5 %   Eosinophils Absolute 0.0 0.0 - 0.7 K/uL   Basophils Relative 0 0 - 1 %   Basophils Absolute 0.0 0.0 - 0.1 K/uL  APTT  Result Value Ref Range   aPTT 29 24 - 37 seconds  Protime-INR  Result Value Ref Range   Prothrombin Time 13.1 11.6 - 15.2 seconds   INR 0.98 0.00 - 1.49   Imaging Review Dg Chest Port 1 View  02/20/2015   CLINICAL DATA:  Right-sided chest pain into the right arm tonight.  EXAM: PORTABLE CHEST - 1 VIEW  COMPARISON:  01/02/2015  FINDINGS: Shallow inspiration with slight atelectasis in the lung bases. The heart size and mediastinal contours are within normal limits. Both lungs are clear. The visualized skeletal structures are unremarkable.  IMPRESSION: Shallow inspiration with atelectasis in the lung bases. No evidence of active pulmonary disease.   Electronically Signed   By: Lucienne Capers M.D.   On: 02/20/2015 00:55     EKG Interpretation   Date/Time:  Wednesday February 19 2015 23:55:09 EDT Ventricular Rate:  90 PR Interval:    QRS Duration: 80 QT Interval:  364 QTC Calculation: 445 R Axis:   5 Text Interpretation:  Atrial fibrillation Abnormal R-wave progression,  early transition When compared with ECG of 11/12/2014, Atrial fibrillation  has replaced Sinus rhythm Confirmed by [facility]  MD, Trudie Cervantes ([id number]) on  02/20/2015 12:03:15 AM      MDM   Final diagnoses:  Chest pain, unspecified chest pain type  Atrial fibrillation, unspecified    Chest pain and palpitations with new onset of atrial fibrillation. No evidence of ischemia  on ECG and chest pain and has completely resolved. Old records were reviewed and he had a stent placed for STEMI in February 2015. There was an area of the LAD with 60% occlusion, but this should not be sitting angina today. I suspect that the chest pain was  related to the rhythm change. He is currently chest pain-free and troponin has come back normal. Case has been discussed with Dr. Aundra Dubin of cardiology service. It has been over a year since his stent was placed, so it is felt appropriate to discontinue ticagrelor and start Todd on anticoagulation with apixaban. Dr. Aundra Dubin will arrange follow-up with his primary cardiologist, Dr. Rayann Heman.  I personally performed the services described in this documentation, which was scribed in my presence. The recorded information has been reviewed and is accurate.      Evan Fuel, MD 38/25/05 [id number]

## 2015-02-19 NOTE — Patient Instructions (Signed)
Your physician has recommended you make the following change in your medication:  1) Decrease Brilinta to 60 mg one tablet by mouth twice daily  Your physician wants you to follow-up in: 6 months with [name], NP & 1 year with Dr. Rayann Heman. You will receive a reminder letter in the mail two months in advance. If you don't receive a letter, please call our office to schedule the follow-up appointment.

## 2015-02-19 NOTE — Telephone Encounter (Signed)
Called refill into pharmacy spoke with Audrea Muscat gave md approval.../lmb

## 2015-02-20 ENCOUNTER — Emergency Department (HOSPITAL_COMMUNITY): Payer: Medicare Other

## 2015-02-20 ENCOUNTER — Ambulatory Visit (INDEPENDENT_AMBULATORY_CARE_PROVIDER_SITE_OTHER): Payer: Medicare Other | Admitting: Internal Medicine

## 2015-02-20 ENCOUNTER — Encounter: Payer: Self-pay | Admitting: Internal Medicine

## 2015-02-20 ENCOUNTER — Encounter (HOSPITAL_COMMUNITY): Payer: Self-pay | Admitting: Emergency Medicine

## 2015-02-20 VITALS — BP 108/82 | HR 72 | Ht 72.0 in | Wt 182.8 lb

## 2015-02-20 DIAGNOSIS — I4891 Unspecified atrial fibrillation: Secondary | ICD-10-CM | POA: Diagnosis not present

## 2015-02-20 DIAGNOSIS — R0789 Other chest pain: Secondary | ICD-10-CM | POA: Diagnosis not present

## 2015-02-20 DIAGNOSIS — I25119 Atherosclerotic heart disease of native coronary artery with unspecified angina pectoris: Secondary | ICD-10-CM | POA: Diagnosis not present

## 2015-02-20 DIAGNOSIS — I480 Paroxysmal atrial fibrillation: Secondary | ICD-10-CM

## 2015-02-20 DIAGNOSIS — I10 Essential (primary) hypertension: Secondary | ICD-10-CM

## 2015-02-20 DIAGNOSIS — R0683 Snoring: Secondary | ICD-10-CM | POA: Diagnosis not present

## 2015-02-20 DIAGNOSIS — I2510 Atherosclerotic heart disease of native coronary artery without angina pectoris: Secondary | ICD-10-CM | POA: Diagnosis not present

## 2015-02-20 DIAGNOSIS — R079 Chest pain, unspecified: Secondary | ICD-10-CM | POA: Diagnosis not present

## 2015-02-20 LAB — BASIC METABOLIC PANEL
Anion gap: 6 (ref 5–15)
BUN: 17 mg/dL (ref 6–23)
CO2: 25 mmol/L (ref 19–32)
Calcium: 9 mg/dL (ref 8.4–10.5)
Chloride: 109 mmol/L (ref 96–112)
Creatinine, Ser: 1.02 mg/dL (ref 0.50–1.35)
GFR calc Af Amer: 79 mL/min — ABNORMAL LOW (ref >90)
GFR calc non Af Amer: 68 mL/min — ABNORMAL LOW (ref >90)
Glucose, Bld: 118 mg/dL — ABNORMAL HIGH (ref 70–99)
Potassium: 3.9 mmol/L (ref 3.5–5.1)
Sodium: 140 mmol/L (ref 135–145)

## 2015-02-20 LAB — CBC WITH DIFFERENTIAL/PLATELET
Basophils Absolute: 0 10*3/uL (ref 0.0–0.1)
Basophils Relative: 0 % (ref 0–1)
Eosinophils Absolute: 0 10*3/uL (ref 0.0–0.7)
Eosinophils Relative: 1 % (ref 0–5)
HCT: 38 % — ABNORMAL LOW (ref 39.0–52.0)
Hemoglobin: 12.8 g/dL — ABNORMAL LOW (ref 13.0–17.0)
Lymphocytes Relative: 41 % (ref 12–46)
Lymphs Abs: 2.5 10*3/uL (ref 0.7–4.0)
MCH: 33 pg (ref 26.0–34.0)
MCHC: 33.7 g/dL (ref 30.0–36.0)
MCV: 97.9 fL (ref 78.0–100.0)
Monocytes Absolute: 0.4 10*3/uL (ref 0.1–1.0)
Monocytes Relative: 7 % (ref 3–12)
Neutro Abs: 3.1 10*3/uL (ref 1.7–7.7)
Neutrophils Relative %: 51 % (ref 43–77)
Platelets: 234 10*3/uL (ref 150–400)
RBC: 3.88 MIL/uL — ABNORMAL LOW (ref 4.22–5.81)
RDW: 13.8 % (ref 11.5–15.5)
WBC: 6 10*3/uL (ref 4.0–10.5)

## 2015-02-20 LAB — TROPONIN I: Troponin I: <0.03 ng/mL (ref <0.031)

## 2015-02-20 LAB — APTT: aPTT: 29 seconds (ref 24–37)

## 2015-02-20 LAB — PROTIME-INR
INR: 0.98 (ref 0.00–1.49)
Prothrombin Time: 13.1 seconds (ref 11.6–15.2)

## 2015-02-20 MED ORDER — HEPARIN (PORCINE) IN NACL 100-0.45 UNIT/ML-% IJ SOLN
1150.0000 [IU]/h | INTRAMUSCULAR | Status: DC
  Filled 2015-02-20: qty 250

## 2015-02-20 MED ORDER — METOPROLOL TARTRATE 25 MG PO TABS: 37.5000 mg | ORAL_TABLET | Freq: Two times a day (BID) | ORAL | Status: DC

## 2015-02-20 MED ORDER — APIXABAN 5 MG PO TABS
5.0000 mg | ORAL_TABLET | Freq: Once | ORAL | Status: AC
  Administered 2015-02-20: 5 mg via ORAL
  Filled 2015-02-20: qty 1

## 2015-02-20 MED ORDER — APIXABAN 5 MG PO TABS: 5.0000 mg | ORAL_TABLET | Freq: Two times a day (BID) | ORAL | Status: DC

## 2015-02-20 NOTE — ED Notes (Signed)
X-ray at bedside

## 2015-02-20 NOTE — Discharge Instructions (Signed)
Stop taking ticagrelor (Brilinta). Start taking apixaban (Eliquis) twice a day.   Atrial Fibrillation Atrial fibrillation is a type of irregular heart rhythm (arrhythmia). During atrial fibrillation, the upper chambers of the heart (atria) quiver continuously in a chaotic pattern. This causes an irregular and often rapid heart rate.  Atrial fibrillation is the result of the heart becoming overloaded with disorganized signals that tell it to beat. These signals are normally released one at a time by a part of the right atrium called the sinoatrial node. They then travel from the atria to the lower chambers of the heart (ventricles), causing the atria and ventricles to contract and pump blood as they pass. In atrial fibrillation, parts of the atria outside of the sinoatrial node also release these signals. This results in two problems. First, the atria receive so many signals that they do not have time to fully contract. Second, the ventricles, which can only receive one signal at a time, beat irregularly and out of rhythm with the atria.  There are three types of atrial fibrillation:   Paroxysmal. Paroxysmal atrial fibrillation starts suddenly and stops on its own within a week.  Persistent. Persistent atrial fibrillation lasts for more than a week. It may stop on its own or with treatment.  Permanent. Permanent atrial fibrillation does not go away. Episodes of atrial fibrillation may lead to permanent atrial fibrillation. Atrial fibrillation can prevent your heart from pumping blood normally. It increases your risk of stroke and can lead to heart failure.  CAUSES   Heart conditions, including a heart attack, heart failure, coronary artery disease, and heart valve conditions.   Inflammation of the sac that surrounds the heart (pericarditis).  Blockage of an artery in the lungs (pulmonary embolism).  Pneumonia or other infections.  Chronic lung disease.  Thyroid problems, especially if the  thyroid is overactive (hyperthyroidism).  Caffeine, excessive alcohol use, and use of some illegal drugs.   Use of some medicines, including certain decongestants and diet pills.  Heart surgery.   Birth defects.  Sometimes, no cause can be found. When this happens, the atrial fibrillation is called lone atrial fibrillation. The risk of complications from atrial fibrillation increases if you have lone atrial fibrillation and you are age 60 years or older. RISK FACTORS  Heart failure.  Coronary artery disease.  Diabetes mellitus.   High blood pressure (hypertension).   Obesity.   Other arrhythmias.   Increased age. SIGNS AND SYMPTOMS   A feeling that your heart is beating rapidly or irregularly.   A feeling of discomfort or pain in your chest.   Shortness of breath.   Sudden light-headedness or weakness.   Getting tired easily when exercising.   Urinating more often than normal (mainly when atrial fibrillation first begins).  In paroxysmal atrial fibrillation, symptoms may start and suddenly stop. DIAGNOSIS  Your health care provider may be able to detect atrial fibrillation when taking your pulse. Your health care provider may have you take a test called an ambulatory electrocardiogram (ECG). An ECG records your heartbeat patterns over a 24-hour period. You may also have other tests, such as:  Transthoracic echocardiogram (TTE). During echocardiography, sound waves are used to evaluate how blood flows through your heart.  Transesophageal echocardiogram (TEE).  Stress test. There is more than one type of stress test. If a stress test is needed, ask your health care provider about which type is best for you.  Chest X-ray exam.  Blood tests.  Computed tomography (CT). TREATMENT  Treatment may include:  Treating any underlying conditions. For example, if you have an overactive thyroid, treating the condition may correct atrial fibrillation.  Taking  medicine. Medicines may be given to control a rapid heart rate or to prevent blood clots, heart failure, or a stroke.  Having a procedure to correct the rhythm of the heart:  Electrical cardioversion. During electrical cardioversion, a controlled, low-energy shock is delivered to the heart through your skin. If you have chest pain, very low blood pressure, or sudden heart failure, this procedure may need to be done as an emergency.  Catheter ablation. During this procedure, heart tissues that send the signals that cause atrial fibrillation are destroyed.  Surgical ablation. During this surgery, thin lines of heart tissue that carry the abnormal signals are destroyed. This procedure can either be an open-heart surgery or a minimally invasive surgery. With the minimally invasive surgery, small cuts are made to access the heart instead of a large opening.  Pulmonary venous isolation. During this surgery, tissue around the veins that carry blood from the lungs (pulmonary veins) is destroyed. This tissue is thought to carry the abnormal signals. HOME CARE INSTRUCTIONS   Take medicines only as directed by your health care provider. Some medicines can make atrial fibrillation worse or recur.  If blood thinners were prescribed by your health care provider, take them exactly as directed. Too much blood-thinning medicine can cause bleeding. If you take too little, you will not have the needed protection against stroke and other problems.  Perform blood tests at home if directed by your health care provider. Perform blood tests exactly as directed.  Quit smoking if you smoke.  Do not drink alcohol.  Do not drink caffeinated beverages such as coffee, soda, and some teas. You may drink decaffeinated coffee, soda, or tea.   Maintain a healthy weight.Do not use diet pills unless your health care provider approves. They may make heart problems worse.   Follow diet instructions as directed by your  health care provider.  Exercise regularly as directed by your health care provider.  Keep all follow-up visits as directed by your health care provider. This is important. PREVENTION  The following substances can cause atrial fibrillation to recur:   Caffeinated beverages.  Alcohol.  Certain medicines, especially those used for breathing problems.  Certain herbs and herbal medicines, such as those containing ephedra or ginseng.  Illegal drugs, such as cocaine and amphetamines. Sometimes medicines are given to prevent atrial fibrillation from recurring. Proper treatment of any underlying condition is also important in helping prevent recurrence.  SEEK MEDICAL CARE IF:  You notice a change in the rate, rhythm, or strength of your heartbeat.  You suddenly begin urinating more frequently.  You tire more easily when exerting yourself or exercising. SEEK IMMEDIATE MEDICAL CARE IF:   You have chest pain, abdominal pain, sweating, or weakness.  You feel nauseous.  You have shortness of breath.  You suddenly have swollen feet and ankles.  You feel dizzy.  Your face or limbs feel numb or weak.  You have a change in your vision or speech. MAKE SURE YOU:   Understand these instructions.  Will watch your condition.  Will get help right away if you are not doing well or get worse. Document Released: 11/01/2005 Document Revised: 03/18/2014 Document Reviewed: 12/12/2012 [facility] Patient Information 2015 [place], [place]. This information is not intended to replace advice given to you by your health care provider. Make sure you discuss any questions you  have with your health care provider.  Information on my medicine - ELIQUIS (apixaban)  This medication education was reviewed with me or my healthcare representative as part of my discharge preparation.  The pharmacist that spoke with me during my hospital stay was:  Arman Bogus, [facility]  Why was Eliquis prescribed for  you? Eliquis was prescribed for you to reduce the risk of a blood clot forming that can cause a stroke if you have a medical condition called atrial fibrillation (a type of irregular heartbeat).  What do You need to know about Eliquis ? Take your Eliquis TWICE DAILY - one tablet in the morning and one tablet in the evening with or without food. If you have difficulty swallowing the tablet whole please discuss with your pharmacist how to take the medication safely.  Take Eliquis exactly as prescribed by your doctor and DO NOT stop taking Eliquis without talking to the doctor who prescribed the medication.  Stopping may increase your risk of developing a stroke.  Refill your prescription before you run out.  After discharge, you should have regular check-up appointments with your healthcare provider that is prescribing your Eliquis.  In the future your dose may need to be changed if your kidney function or weight changes by a significant amount or as you get older.  What do you do if you miss a dose? If you miss a dose, take it as soon as you remember on the same day and resume taking twice daily.  Do not take more than one dose of ELIQUIS at the same time to make up a missed dose.  Important Safety Information A possible side effect of Eliquis is bleeding. You should call your healthcare provider right away if you experience any of the following: ? Bleeding from an injury or your nose that does not stop. ? Unusual colored urine (red or dark brown) or unusual colored stools (red or [place]). ? Unusual bruising for unknown reasons. ? A serious fall or if you hit your head (even if there is no bleeding).  Some medicines may interact with Eliquis and might increase your risk of bleeding or clotting while on Eliquis. To help avoid this, consult your healthcare provider or pharmacist prior to using any new prescription or non-prescription medications, including herbals, vitamins, non-steroidal  anti-inflammatory drugs (NSAIDs) and supplements.  This website has more information on Eliquis (apixaban): http://www.eliquis.com/eliquis/home

## 2015-02-20 NOTE — ED Notes (Signed)
MD Glick at bedside. 

## 2015-02-20 NOTE — ED Notes (Signed)
Phlebotomy at bedside.

## 2015-02-20 NOTE — Progress Notes (Addendum)
ANTICOAGULATION CONSULT NOTE - Initial Consult  Pharmacy Consult for Heparin Indication: Atrial Fibrillation  Allergies  Allergen Reactions  . Methotrexate Other (See Comments)    REACTION: tachycardia  . Atorvastatin Other (See Comments)    Muscle pain, leg cramps  . Pravastatin Sodium Other (See Comments)    REACTION: cramps, fatigue    Patient Measurements: Height: 6' (182.9 cm) Weight: 180 lb (81.647 kg) IBW/kg (Calculated) : 77.6 Heparin Dosing Weight: 81.6 kg  Vital Signs: Temp: 97.8 F (36.6 C) (04/07 0001) Temp Source: Oral (04/07 0001) BP: 106/78 mmHg (04/07 0001) Pulse Rate: 82 (04/07 0001)  Labs: No results for input(s): HGB, HCT, PLT, APTT, LABPROT, INR, HEPARINUNFRC, CREATININE, CKTOTAL, CKMB, TROPONINI in the last 72 hours.  CrCl cannot be calculated (Patient has no serum creatinine result on file.).   Medical History: Past Medical History  Diagnosis Date  . Colon polyp   . Hyperlipidemia   . Osteoarthritis   . Migraine with aura   . RA (rheumatoid arthritis)   . Premature atrial contractions   . GERD (gastroesophageal reflux disease)   . Hypertension   . CAD (coronary artery disease) 12/2013    s/p inferior STEMI 01/08/2014; LHC 01/08/14: total RCA occlusion s/p 3.5x53mm Xience DES distal RCA and 3.5x28 mm DES mid RCA, 60-70% mid LAD stenosis, EF 55%  . Palpitations   . STEMI (ST elevation myocardial infarction)   . Hx of echocardiogram     ETT- Myoview (1/16):  diaph atten, EF 63%, Low Risk      Assessment: 81 y.o male presents to ED with complaint that he felt "lightheaded, dizzy, and palpitations" and sudden onset R sided chest pain. Pt has no prior hx of A fib, but  has had irregular heartbeat. Hx of MI with stent placement in Feb 2015. He is on ASA and Brilita prior to admission. Last Myoview January 2016 without any abnormal findings.   EMS gave pt 10 mg bolus of cardizem PTA due to HR in 140s.  Pharmacy consulted to start IV heparin  infusion for atrial fibrillation. No bleeding signs or symptoms reported. Baseline CBC pending.    Goal of Therapy:  Heparin level 0.3-0.7 units/ml Monitor platelets by anticoagulation protocol: Yes   Plan:  Start IV Heparin drip at 1150 units/hr.  Will follow up on baseline CBC result before ordering a bolus of heparin.  Check heparin level 8 hours post start of IV heparin. Daily heparin level, CBC.  Thank you for allowing pharmacy to be part of this patients care team. Nicole Cella, RPh Clinical Pharmacist Pager: [phone] 02/20/2015,12:29 AM  ADDENDUM:  Dr. Roxanne Mins has discontinued the heparin & heparin consult.   Nicole Cella, RPh Clinical Pharmacist Pager: [phone] 02/20/2015 01:15 AM

## 2015-02-20 NOTE — Patient Instructions (Signed)
Your physician recommends that you schedule a follow-up appointment in: 4 weeks with Dr Rayann Heman  Your physician has recommended that you have a sleep study. This test records several body functions during sleep, including: brain activity, eye movement, oxygen and carbon dioxide blood levels, heart rate and rhythm, breathing rate and rhythm, the flow of air through your mouth and nose, snoring, body muscle movements, and chest and belly movement.  Your physician has recommended you make the following change in your medication:  1) Increase metoprolol to 37.5 mg twice daily

## 2015-02-21 ENCOUNTER — Encounter: Payer: Self-pay | Admitting: Internal Medicine

## 2015-02-21 DIAGNOSIS — I480 Paroxysmal atrial fibrillation: Secondary | ICD-10-CM | POA: Insufficient documentation

## 2015-02-21 NOTE — Progress Notes (Signed)
PCP: [name], MD  [name] is a 81 y.o. male who presents today for cardiology followup.  02/19/15 pm, he developed atrial fibrillation.  He reports symptoms of fatigue and palpitations.  He found this alarming and therefore presented to [facility] ED.  He converted to sinus rhythm.  He was started in Eliquis and his Brilinta was stopped.  He presents today for further guidance.  Today, he denies symptoms of palpitations, chest pain, shortness of breath,  lower extremity edema, dizziness, presyncope, or syncope.  The patient is otherwise without complaint today.   Past Medical History  Diagnosis Date  . Colon polyp   . Hyperlipidemia   . Osteoarthritis   . Migraine with aura   . RA (rheumatoid arthritis)   . Premature atrial contractions   . GERD (gastroesophageal reflux disease)   . Hypertension   . CAD (coronary artery disease) 12/2013    s/p inferior STEMI 01/08/2014; LHC 01/08/14: total RCA occlusion s/p 3.5x38mm Xience DES distal RCA and 3.5x28 mm DES mid RCA, 60-70% mid LAD stenosis, EF 55%  . Palpitations   . STEMI (ST elevation myocardial infarction)   . Hx of echocardiogram     ETT- Myoview (1/16):  diaph atten, EF 63%, Low Risk  . Paroxysmal atrial fibrillation 4/16    chads2vasc score is at least 4   Past Surgical History  Procedure Laterality Date  . Back surgery  2004  . Left heart catheterization with coronary angiogram N/A 01/08/2014    Procedure: LEFT HEART CATHETERIZATION WITH CORONARY ANGIOGRAM;  Surgeon: Troy Sine, MD;  Location: [facility] CATH LAB;  Service: Cardiovascular;  Laterality: N/A;  . Percutaneous coronary stent intervention (pci-s)  01/08/2014    Procedure: PERCUTANEOUS CORONARY STENT INTERVENTION (PCI-S);  Surgeon: Troy Sine, MD;  Location: [facility] CATH LAB;  Service: Cardiovascular;;   ROS- all systems are reviewed and negative except as per HPI In addition, he has RA which is stable presently but limits prednisone reduction  Current Outpatient  Prescriptions  Medication Sig Dispense Refill  . Abatacept (ORENCIA [place]) Inject 1 Applicatorful into the skin See admin instructions. Every 4 weeks    . apixaban (ELIQUIS) 5 MG TABS tablet Take 1 tablet (5 mg total) by mouth 2 (two) times daily. 60 tablet 0  . aspirin 81 MG tablet Take 81 mg by mouth daily.    . Cholecalciferol (VITAMIN D) 1000 UNITS capsule Take 1,000 Units by mouth daily.     . CRESTOR 10 MG tablet TAKE ONE TABLET EACH DAY (Patient taking differently: TAKE ONE TABLET BY MOUTH EACH DAY) 30 tablet 3  . Cyanocobalamin 2500 MCG SUBL Place 1 tablet under the tongue daily.     . DULoxetine (CYMBALTA) 60 MG capsule TAKE ONE CAPSULE EACH DAY (Patient taking differently: TAKE ONE CAPSULE BY MOUTH EACH DAY) 30 capsule 5  . ezetimibe (ZETIA) 10 MG tablet Take 5 mg by mouth daily.     [name] lisinopril (PRINIVIL,ZESTRIL) 5 MG tablet Take 1 tablet (5 mg total) by mouth daily. 90 tablet 3  . LORazepam (ATIVAN) 0.5 MG tablet TAKE 1 TO 2 TABLETS TWICE A DAY AS NEEDED FOR ANXIETY (Patient taking differently: TAKE 1 TO 2 TABLETS BY MOUTH TWICE A DAY AS NEEDED FOR ANXIETY) 60 tablet 5  . metoprolol tartrate (LOPRESSOR) 25 MG tablet Take 1.5 tablets (37.5 mg total) by mouth 2 (two) times daily. 270 tablet 3  . nitroGLYCERIN (NITROSTAT) 0.4 MG SL tablet Place 1 tablet (0.4 mg total)  under the tongue every 5 (five) minutes x 3 doses as needed for chest pain. (Patient taking differently: Place 0.4 mg under the tongue every 5 (five) minutes x 3 doses as needed for chest pain (MAX 3 TABLETS). ) 30 tablet 12  . omeprazole (PRILOSEC) 40 MG capsule Take 40 mg by mouth daily.    [name] oxyCODONE-acetaminophen (PERCOCET/ROXICET) 5-325 MG per tablet Take 1 tablet by mouth every 6 (six) hours as needed for severe pain. 100 tablet 0  . predniSONE (DELTASONE) 1 MG tablet Take 3 mg by mouth daily with breakfast.     . Propylene Glycol (SYSTANE BALANCE OP) Apply 1 drop to eye daily as needed (dry eyeS).     [name] zolpidem  (AMBIEN) 10 MG tablet Take 5 mg by mouth at bedtime as needed for sleep.     No current facility-administered medications for this visit.    Physical Exam: Filed Vitals:   02/20/15 0957  BP: 108/82  Pulse: 72  Height: 6' (1.829 m)  Weight: 182 lb 12.8 oz (82.918 kg)    GEN- The patient is well appearing, alert and oriented x 3 today.   Head- normocephalic, atraumatic Eyes-  Sclera clear, conjunctiva pink Ears- hearing intact Oropharynx- clear Lungs- Clear to ausculation bilaterally, normal work of breathing Heart- Regular rate and rhythm, no murmurs, rubs or gallops, PMI not laterally displaced GI- soft, NT, ND, + BS Extremities- no clubbing, cyanosis, or edema  ekg today reveals sinus rhythm 72 bpm, otherwise normal ekg EKG from 04/21/15 (ED) is reviewed and reveals afib 90 bpm, nonspecific ST/T changes  Assessment and Plan:  1. AF New diagnosis Agree with eliquis chads2vasc score is at least 4 Increase metoprolol to 37.5mg  BID Would not initiate AAD therapy unless he has additional afib Sleep study (see below) ETOH cessation advised.  He is not a heavy drinker.  2. Snoring/ fatigue Will order sleep study to evaluate these symptoms particularly in the setting of AF  3. Cad Doing well  No ischemic symptoms. Off of antiplatelet therapy given initiate of [place].  > 1 year post PCI.  5. htn Stable No change required today  6. HL Stable No change required today Followed in lipid clinic   Return to see me in 4 weeks for follow-up

## 2015-02-28 DIAGNOSIS — M0609 Rheumatoid arthritis without rheumatoid factor, multiple sites: Secondary | ICD-10-CM | POA: Diagnosis not present

## 2015-03-03 DIAGNOSIS — M0609 Rheumatoid arthritis without rheumatoid factor, multiple sites: Secondary | ICD-10-CM | POA: Diagnosis not present

## 2015-03-03 DIAGNOSIS — M47812 Spondylosis without myelopathy or radiculopathy, cervical region: Secondary | ICD-10-CM | POA: Diagnosis not present

## 2015-03-05 ENCOUNTER — Other Ambulatory Visit: Payer: Self-pay | Admitting: Internal Medicine

## 2015-03-21 DIAGNOSIS — M0589 Other rheumatoid arthritis with rheumatoid factor of multiple sites: Secondary | ICD-10-CM | POA: Diagnosis not present

## 2015-03-24 ENCOUNTER — Encounter: Payer: Self-pay | Admitting: Internal Medicine

## 2015-03-24 ENCOUNTER — Ambulatory Visit (INDEPENDENT_AMBULATORY_CARE_PROVIDER_SITE_OTHER): Payer: Medicare Other | Admitting: Internal Medicine

## 2015-03-24 VITALS — BP 116/62 | HR 57 | Ht 72.0 in | Wt 183.2 lb

## 2015-03-24 DIAGNOSIS — E782 Mixed hyperlipidemia: Secondary | ICD-10-CM

## 2015-03-24 DIAGNOSIS — I480 Paroxysmal atrial fibrillation: Secondary | ICD-10-CM | POA: Diagnosis not present

## 2015-03-24 DIAGNOSIS — I2510 Atherosclerotic heart disease of native coronary artery without angina pectoris: Secondary | ICD-10-CM

## 2015-03-24 NOTE — Patient Instructions (Signed)
Medication Instructions:  Your physician recommends that you continue on your current medications as directed. Please refer to the Current Medication list given to you today.   Labwork: None ordered  Testing/Procedures: None ordered  Follow-Up: Your physician recommends that you schedule a follow-up appointment in: 3 months with Roderic Palau, NP and 6 months with Dr Rayann Heman   Any Other Special Instructions Will Be Listed Below (If Applicable).

## 2015-03-24 NOTE — Progress Notes (Signed)
Electrophysiology Office Note   Date:  03/24/2015   ID:  [name], [name] [birth year], MRN [id number]  PCP:  Evan Kehr, MD  Primary Electrophysiologist: Evan Grayer, MD    Chief Complaint  Patient presents with  . Atrial Fibrillation     History of Present Illness: [name] is a 81 y.o. male who presents today for electrophysiology evaluation.   Doing very well.  No afib since I saw him last.  Tolerating anticoagulation.  He actually feels that his bruising is less than with brillinta.  Today, he denies symptoms of palpitations, chest pain, shortness of breath, orthopnea, PND, lower extremity edema, claudication, dizziness, presyncope, syncope, bleeding, or neurologic sequela. The patient is tolerating medications without difficulties and is otherwise without complaint today.    Past Medical History  Diagnosis Date  . Colon polyp   . Hyperlipidemia   . Osteoarthritis   . Migraine with aura   . RA (rheumatoid arthritis)   . Premature atrial contractions   . GERD (gastroesophageal reflux disease)   . Hypertension   . CAD (coronary artery disease) 12/2013    s/p inferior STEMI 01/08/2014; LHC 01/08/14: total RCA occlusion s/p 3.5x95mm Xience DES distal RCA and 3.5x28 mm DES mid RCA, 60-70% mid LAD stenosis, EF 55%  . Palpitations   . STEMI (ST elevation myocardial infarction)   . Hx of echocardiogram     ETT- Myoview (1/16):  diaph atten, EF 63%, Low Risk  . Paroxysmal atrial fibrillation 4/16    chads2vasc score is at least 4   Past Surgical History  Procedure Laterality Date  . Back surgery  2004  . Left heart catheterization with coronary angiogram N/A 01/08/2014    Procedure: LEFT HEART CATHETERIZATION WITH CORONARY ANGIOGRAM;  Surgeon: Evan Sine, MD;  Location: [facility] CATH LAB;  Service: Cardiovascular;  Laterality: N/A;  . Percutaneous coronary stent intervention (pci-s)  01/08/2014    Procedure: PERCUTANEOUS CORONARY STENT INTERVENTION (PCI-S);  Surgeon:  Evan Sine, MD;  Location: [facility] CATH LAB;  Service: Cardiovascular;;     Current Outpatient Prescriptions  Medication Sig Dispense Refill  . Abatacept (ORENCIA [place]) Inject 1 Applicatorful into the skin See admin instructions. Every 4 weeks    . apixaban (ELIQUIS) 5 MG TABS tablet Take 1 tablet (5 mg total) by mouth 2 (two) times daily. 60 tablet 0  . aspirin 81 MG tablet Take 81 mg by mouth daily.    . Cholecalciferol (VITAMIN D) 1000 UNITS capsule Take 1,000 Units by mouth daily.     . CRESTOR 10 MG tablet TAKE ONE TABLET EACH DAY (Patient taking differently: TAKE ONE TABLET BY MOUTH EACH DAY) 30 tablet 3  . Cyanocobalamin 2500 MCG SUBL Place 1 tablet under the tongue daily.     . DULoxetine (CYMBALTA) 60 MG capsule TAKE ONE CAPSULE EACH DAY (Patient taking differently: TAKE ONE CAPSULE BY MOUTH EACH DAY) 30 capsule 5  . ezetimibe (ZETIA) 10 MG tablet Take 5 mg by mouth daily.     [name] lisinopril (PRINIVIL,ZESTRIL) 5 MG tablet TAKE ONE TABLET BY MOUTH ONCE DAILY 90 tablet 0  . LORazepam (ATIVAN) 0.5 MG tablet TAKE 1 TO 2 TABLETS TWICE A DAY AS NEEDED FOR ANXIETY (Patient taking differently: TAKE 1 TO 2 TABLETS BY MOUTH TWICE A DAY AS NEEDED FOR ANXIETY) 60 tablet 5  . metoprolol tartrate (LOPRESSOR) 25 MG tablet Take 1.5 tablets (37.5 mg total) by mouth 2 (two) times daily. 270 tablet 3  . nitroGLYCERIN (  NITROSTAT) 0.4 MG SL tablet Place 1 tablet (0.4 mg total) under the tongue every 5 (five) minutes x 3 doses as needed for chest pain. (Patient taking differently: Place 0.4 mg under the tongue every 5 (five) minutes x 3 doses as needed for chest pain (MAX 3 TABLETS). ) 30 tablet 12  . omeprazole (PRILOSEC) 40 MG capsule Take 40 mg by mouth daily.    [name] oxyCODONE-acetaminophen (PERCOCET/ROXICET) 5-325 MG per tablet Take 1 tablet by mouth every 6 (six) hours as needed for severe pain. 100 tablet 0  . predniSONE (DELTASONE) 1 MG tablet Take 3 mg by mouth daily with breakfast.     . Propylene Glycol  (SYSTANE BALANCE OP) Apply 1 drop to eye daily as needed (dry eyeS).     [name] zolpidem (AMBIEN) 10 MG tablet Take 5 mg by mouth at bedtime as needed for sleep.     No current facility-administered medications for this visit.    Allergies:   Methotrexate; Atorvastatin; and Pravastatin sodium   Social History:  The patient  reports that he has quit smoking. He has never used smokeless tobacco. He reports that he drinks about 4.2 - 8.4 oz of alcohol per week. He reports that he does not use illicit drugs.   Family History:  The patient's  family history includes Heart disease in his father; Hypertension in his mother. There is no history of Colon cancer.    ROS:  Please see the history of present illness.   All other systems are reviewed and negative.    PHYSICAL EXAM: VS:  BP 116/62 mmHg  Pulse 57  Ht 6' (1.829 m)  Wt 183 lb 3.2 oz (83.099 kg)  BMI 24.84 kg/m2 , BMI Body mass index is 24.84 kg/(m^2). GEN: Well nourished, well developed, in no acute distress HEENT: normal Neck: no JVD, carotid bruits, or masses Cardiac: RRR; no murmurs, rubs, or gallops,no edema  Respiratory:  clear to auscultation bilaterally, normal work of breathing GI: soft, nontender, nondistended, + BS MS: no deformity or atrophy Skin: warm and dry  Neuro:  Strength and sensation are intact Psych: euthymic mood, full affect  EKG:  EKG is ordered today. The ekg ordered today shows sinus bradycardia 58 bpm, otherwise normal ekg   Recent Labs: 12/10/2014: ALT 27 02/20/2015: BUN 17; Creatinine 1.02; Hemoglobin 12.8*; Platelets 234; Potassium 3.9; Sodium 140    Lipid Panel     Component Value Date/Time   CHOL 145 12/10/2014 0804   TRIG 168.0* 12/10/2014 0804   HDL 53.10 12/10/2014 0804   CHOLHDL 3 12/10/2014 0804   VLDL 33.6 12/10/2014 0804   LDLCALC 58 12/10/2014 0804   LDLDIRECT 168.9 05/24/2013 0732     Wt Readings from Last 3 Encounters:  03/24/15 183 lb 3.2 oz (83.099 kg)  02/20/15 182 lb 12.8 oz  (82.918 kg)  02/20/15 180 lb (81.647 kg)    ASSESSMENT AND PLAN:   1. AF Stable, without recurrent Continue eliquis chads2vasc score is at least 4 Would not initiate AAD therapy unless he has additional afib Sleep study (see below) ETOH cessation advised.  He is not a heavy drinker.  He has made significant efforts at ETOH reduction already  2. Snoring/ fatigue Pending sleep study  3. Cad Doing well  No ischemic symptoms. Off of antiplatelet therapy given initiate of [place].  > 1 year post PCI.  5. htn Stable No change required today  6. HL Stable No change required today Followed in lipid clinic  Return to see Evan Palau NP in the AF clinic in 3 months I will see again in 6 months   Current medicines are reviewed at length with the patient today.   The patient does not have concerns regarding his medicines.  The following changes were made today:  none   Signed, Evan Grayer, MD  03/24/2015 6:00 PM     [address] [place] [id number] [phone] (office) [phone] (fax)

## 2015-03-25 ENCOUNTER — Other Ambulatory Visit: Payer: Self-pay | Admitting: Internal Medicine

## 2015-03-28 DIAGNOSIS — M0609 Rheumatoid arthritis without rheumatoid factor, multiple sites: Secondary | ICD-10-CM | POA: Diagnosis not present

## 2015-04-07 ENCOUNTER — Other Ambulatory Visit: Payer: Self-pay | Admitting: Internal Medicine

## 2015-04-08 NOTE — Telephone Encounter (Signed)
Per note 03-24-15 

## 2015-04-25 DIAGNOSIS — M0609 Rheumatoid arthritis without rheumatoid factor, multiple sites: Secondary | ICD-10-CM | POA: Diagnosis not present

## 2015-04-28 ENCOUNTER — Encounter (HOSPITAL_BASED_OUTPATIENT_CLINIC_OR_DEPARTMENT_OTHER): Payer: Medicare Other

## 2015-05-20 ENCOUNTER — Ambulatory Visit (HOSPITAL_BASED_OUTPATIENT_CLINIC_OR_DEPARTMENT_OTHER): Payer: Medicare Other | Attending: Internal Medicine

## 2015-05-20 VITALS — Ht 72.0 in | Wt 180.0 lb

## 2015-05-20 DIAGNOSIS — R0683 Snoring: Secondary | ICD-10-CM | POA: Insufficient documentation

## 2015-05-20 DIAGNOSIS — G4733 Obstructive sleep apnea (adult) (pediatric): Secondary | ICD-10-CM | POA: Insufficient documentation

## 2015-05-20 DIAGNOSIS — G4736 Sleep related hypoventilation in conditions classified elsewhere: Secondary | ICD-10-CM | POA: Insufficient documentation

## 2015-05-20 DIAGNOSIS — I4891 Unspecified atrial fibrillation: Secondary | ICD-10-CM

## 2015-05-23 DIAGNOSIS — M0609 Rheumatoid arthritis without rheumatoid factor, multiple sites: Secondary | ICD-10-CM | POA: Diagnosis not present

## 2015-05-26 ENCOUNTER — Encounter: Payer: Self-pay | Admitting: Internal Medicine

## 2015-05-26 ENCOUNTER — Ambulatory Visit (INDEPENDENT_AMBULATORY_CARE_PROVIDER_SITE_OTHER): Payer: Medicare Other | Admitting: Internal Medicine

## 2015-05-26 VITALS — BP 122/74 | HR 70 | Temp 97.8°F | Wt 184.0 lb

## 2015-05-26 DIAGNOSIS — I25119 Atherosclerotic heart disease of native coronary artery with unspecified angina pectoris: Secondary | ICD-10-CM

## 2015-05-26 DIAGNOSIS — L03119 Cellulitis of unspecified part of limb: Secondary | ICD-10-CM | POA: Diagnosis not present

## 2015-05-26 DIAGNOSIS — L02419 Cutaneous abscess of limb, unspecified: Secondary | ICD-10-CM | POA: Diagnosis not present

## 2015-05-26 MED ORDER — DOXYCYCLINE HYCLATE 100 MG PO TABS: 100.0000 mg | ORAL_TABLET | Freq: Two times a day (BID) | ORAL | Status: DC

## 2015-05-26 NOTE — Assessment & Plan Note (Addendum)
7/16 R dist leg abscess vs other I&D Dressed Doxy if worse

## 2015-05-26 NOTE — Progress Notes (Signed)
Pre visit review using our clinic review tool, if applicable. No additional management support is needed unless otherwise documented below in the visit note. 

## 2015-05-26 NOTE — Progress Notes (Signed)
Subjective:  Patient ID: [name], male    DOB: 1933/07/27  Age: 81 y.o. MRN: [id number]  CC: No chief complaint on file.   HPI [name] presents for R leg bump x 3 weeks - pt had an insect bite.  Outpatient Prescriptions Prior to Visit  Medication Sig Dispense Refill  . Abatacept (ORENCIA [place]) Inject 1 Applicatorful into the skin See admin instructions. Every 4 weeks    . aspirin 81 MG tablet Take 81 mg by mouth daily.    . Cholecalciferol (VITAMIN D) 1000 UNITS capsule Take 1,000 Units by mouth daily.     . CRESTOR 10 MG tablet TAKE ONE TABLET EACH DAY (Patient taking differently: TAKE ONE TABLET BY MOUTH EACH DAY) 30 tablet 3  . Cyanocobalamin 2500 MCG SUBL Place 1 tablet under the tongue daily.     . DULoxetine (CYMBALTA) 60 MG capsule TAKE ONE CAPSULE EACH DAY (Patient taking differently: TAKE ONE CAPSULE BY MOUTH EACH DAY) 30 capsule 5  . ELIQUIS 5 MG TABS tablet TAKE ONE TABLET TWICE DAILY 60 tablet 6  . ezetimibe (ZETIA) 10 MG tablet Take 5 mg by mouth daily. 45 tablet 1  . lisinopril (PRINIVIL,ZESTRIL) 5 MG tablet TAKE ONE TABLET BY MOUTH ONCE DAILY 90 tablet 0  . LORazepam (ATIVAN) 0.5 MG tablet TAKE 1 TO 2 TABLETS TWICE A DAY AS NEEDED FOR ANXIETY (Patient taking differently: TAKE 1 TO 2 TABLETS BY MOUTH TWICE A DAY AS NEEDED FOR ANXIETY) 60 tablet 5  . metoprolol tartrate (LOPRESSOR) 25 MG tablet Take 1.5 tablets (37.5 mg total) by mouth 2 (two) times daily. 270 tablet 3  . nitroGLYCERIN (NITROSTAT) 0.4 MG SL tablet Place 1 tablet (0.4 mg total) under the tongue every 5 (five) minutes x 3 doses as needed for chest pain. (Patient taking differently: Place 0.4 mg under the tongue every 5 (five) minutes x 3 doses as needed for chest pain (MAX 3 TABLETS). ) 30 tablet 12  . omeprazole (PRILOSEC) 40 MG capsule Take 40 mg by mouth daily.    [name] oxyCODONE-acetaminophen (PERCOCET/ROXICET) 5-325 MG per tablet Take 1 tablet by mouth every 6 (six) hours as needed for severe pain. 100  tablet 0  . predniSONE (DELTASONE) 1 MG tablet Take 3 mg by mouth daily with breakfast.     . Propylene Glycol (SYSTANE BALANCE OP) Apply 1 drop to eye daily as needed (dry eyeS).     [name] zolpidem (AMBIEN) 10 MG tablet Take 5 mg by mouth at bedtime as needed for sleep.     No facility-administered medications prior to visit.    ROS Review of Systems  Constitutional: Negative for fever and chills.    Objective:  BP 122/74 mmHg  Pulse 70  Temp(Src) 97.8 F (36.6 C) (Oral)  Wt 184 lb (83.462 kg)  SpO2 98%  BP Readings from Last 3 Encounters:  05/26/15 122/74  03/24/15 116/62  02/20/15 108/82    Wt Readings from Last 3 Encounters:  05/26/15 184 lb (83.462 kg)  05/20/15 180 lb (81.647 kg)  03/24/15 183 lb 3.2 oz (83.099 kg)    Physical Exam  Constitutional: He is oriented to person, place, and time. He appears well-developed. No distress.  NAD  HENT:  Mouth/Throat: Oropharynx is clear and moist.  Eyes: Conjunctivae are normal. Pupils are equal, round, and reactive to light.  Neck: Normal range of motion.  Cardiovascular: Normal rate, regular rhythm, normal heart sounds and intact distal pulses.  Exam reveals no  friction rub.   Pulmonary/Chest: Effort normal and breath sounds normal.  Abdominal: Soft. Bowel sounds are normal. He exhibits no distension.  Musculoskeletal: Normal range of motion. He exhibits no edema.  Neurological: He is alert and oriented to person, place, and time. He has normal reflexes. No cranial nerve deficit. He exhibits normal muscle tone. He displays a negative Romberg sign. Gait normal.  Skin: Skin is warm and dry. There is erythema.  Psychiatric: He has a normal mood and affect. His behavior is normal. Judgment and thought content normal.  R distal lat shin eryth 1.5 cm sensitive swelling  Lab Results  Component Value Date   WBC 6.0 02/20/2015   HGB 12.8* 02/20/2015   HCT 38.0* 02/20/2015   PLT 234 02/20/2015   GLUCOSE 118* 02/20/2015   CHOL  145 12/10/2014   TRIG 168.0* 12/10/2014   HDL 53.10 12/10/2014   LDLDIRECT 168.9 05/24/2013   LDLCALC 58 12/10/2014   ALT 27 12/10/2014   AST 25 12/10/2014   NA 140 02/20/2015   K 3.9 02/20/2015   CL 109 02/20/2015   CREATININE 1.02 02/20/2015   BUN 17 02/20/2015   CO2 25 02/20/2015   TSH 0.737 01/08/2014   PSA 1.52 06/04/2011   INR 0.98 02/20/2015   HGBA1C 6.2* 01/08/2014   Procedure note:  Incision and Drainage of an Abscess   Indication : a localized collection of pus that is tender and not spontaneously resolving.    Risks including unsuccessful procedure , possible need for a repeat procedure due to pus accumulation, scar formation, and others as well as benefits were explained to the patient in detail. Verbal consent was obtained/signed.    The patient was placed in a decubitus position. The area of an abscess was prepped with povidone-iodine 0.5 cm incision with #11 strait blade was made. Hyperkeratotic skin was removed. About 0.5 cc of bloody material was expressed.   The wound was dressed with antibiotic ointment and bandaid.  Tolerated well. Complications: None.   Wound instructions provided.  Dg Chest Port 1 View  02/20/2015   CLINICAL DATA:  Right-sided chest pain into the right arm tonight.  EXAM: PORTABLE CHEST - 1 VIEW  COMPARISON:  01/02/2015  FINDINGS: Shallow inspiration with slight atelectasis in the lung bases. The heart size and mediastinal contours are within normal limits. Both lungs are clear. The visualized skeletal structures are unremarkable.  IMPRESSION: Shallow inspiration with atelectasis in the lung bases. No evidence of active pulmonary disease.   Electronically Signed   By: Lucienne Capers M.D.   On: 02/20/2015 00:55    Assessment & Plan:   Diagnoses and all orders for this visit:  Cellulitis and abscess of leg, except foot  Other orders -     doxycycline (VIBRA-TABS) 100 MG tablet; Take 1 tablet (100 mg total) by mouth 2 (two) times  daily.   I am having Mr. Belluomini start on doxycycline. I am also having him maintain his Cyanocobalamin, Vitamin D, aspirin, Abatacept (ORENCIA [place]), nitroGLYCERIN, omeprazole, Propylene Glycol (SYSTANE BALANCE OP), zolpidem, oxyCODONE-acetaminophen, predniSONE, CRESTOR, DULoxetine, LORazepam, metoprolol tartrate, lisinopril, ezetimibe, and ELIQUIS.  Meds ordered this encounter  Medications  . doxycycline (VIBRA-TABS) 100 MG tablet    Sig: Take 1 tablet (100 mg total) by mouth 2 (two) times daily.    Dispense:  20 tablet    Refill:  0     Follow-up: No Follow-up on file.  [name], MD

## 2015-05-29 ENCOUNTER — Other Ambulatory Visit: Payer: Self-pay | Admitting: Internal Medicine

## 2015-06-02 ENCOUNTER — Other Ambulatory Visit: Payer: Self-pay | Admitting: Internal Medicine

## 2015-06-09 DIAGNOSIS — M4302 Spondylolysis, cervical region: Secondary | ICD-10-CM | POA: Diagnosis not present

## 2015-06-09 DIAGNOSIS — M069 Rheumatoid arthritis, unspecified: Secondary | ICD-10-CM | POA: Diagnosis not present

## 2015-06-12 ENCOUNTER — Telehealth: Payer: Self-pay | Admitting: Internal Medicine

## 2015-06-12 DIAGNOSIS — C44722 Squamous cell carcinoma of skin of right lower limb, including hip: Secondary | ICD-10-CM | POA: Diagnosis not present

## 2015-06-12 DIAGNOSIS — Z85828 Personal history of other malignant neoplasm of skin: Secondary | ICD-10-CM | POA: Diagnosis not present

## 2015-06-12 NOTE — Telephone Encounter (Signed)
New message    Pt calling in regards to results of sleep test Please call to discuss

## 2015-06-13 NOTE — Telephone Encounter (Signed)
Left message on machine for patient that the test has not resulted and I will call him  Next week after Dr Rayann Heman reviews

## 2015-06-16 NOTE — Telephone Encounter (Signed)
The system was upgraded and we have been having problems getting the studies read.  Will contact with results as soon as study is read

## 2015-06-16 NOTE — Telephone Encounter (Signed)
Is he referring to his sleep study?

## 2015-06-16 NOTE — Telephone Encounter (Signed)
Patient wanted Sleep Study results. The PSG was completed 05/20/15.

## 2015-06-17 NOTE — Telephone Encounter (Signed)
Patient is aware that as soon as I have results I will give him a call.  He was very understanding and appreciated the call

## 2015-06-19 ENCOUNTER — Telehealth: Payer: Self-pay | Admitting: Cardiology

## 2015-06-19 ENCOUNTER — Encounter (HOSPITAL_BASED_OUTPATIENT_CLINIC_OR_DEPARTMENT_OTHER): Payer: Self-pay

## 2015-06-19 ENCOUNTER — Other Ambulatory Visit: Payer: Self-pay | Admitting: Internal Medicine

## 2015-06-19 DIAGNOSIS — G4733 Obstructive sleep apnea (adult) (pediatric): Secondary | ICD-10-CM | POA: Insufficient documentation

## 2015-06-19 DIAGNOSIS — C44722 Squamous cell carcinoma of skin of right lower limb, including hip: Secondary | ICD-10-CM | POA: Diagnosis not present

## 2015-06-19 DIAGNOSIS — L570 Actinic keratosis: Secondary | ICD-10-CM | POA: Diagnosis not present

## 2015-06-19 DIAGNOSIS — Z9989 Dependence on other enabling machines and devices: Secondary | ICD-10-CM

## 2015-06-19 DIAGNOSIS — Z85828 Personal history of other malignant neoplasm of skin: Secondary | ICD-10-CM | POA: Diagnosis not present

## 2015-06-19 HISTORY — DX: Dependence on other enabling machines and devices: Z99.89

## 2015-06-19 HISTORY — DX: Obstructive sleep apnea (adult) (pediatric): G47.33

## 2015-06-19 NOTE — Sleep Study (Signed)
SLEEP STUDY   Patient Name: [name], [name] Date: 05/20/2015 MRN: [id number] Gender: Male D.O.B: Aug 09, 1933 Age (years): 60 Referring Provider: Thompson Grayer Interpreting Physician: Fransico Him MD, ABSM RPSGT: Madelon Lips  Height (inches): 72 Weight (lbs): 180 BMI: 24 Neck Size: 16.50  CLINICAL INFORMATION Sleep Study Type: NPSG  Indication for sleep study: OSA  Epworth Sleepiness Score: 11  SLEEP STUDY TECHNIQUE As per the AASM Manual for the Scoring of Sleep and Associated Events v2.3 (April 2016) with a hypopnea requiring 4% desaturations.  The channels recorded and monitored were frontal, central and occipital EEG, electrooculogram (EOG), submentalis EMG (chin), nasal and oral airflow, thoracic and abdominal wall motion, anterior tibialis EMG, snore microphone, electrocardiogram, and pulse oximetry.  MEDICATIONS Patient's medications include: Abatacept, ASA, Vit D, Cymbalta, Eliquis, Zetia, Ativan, Lisinopril, Lopressor, Prilosec, Percocet, Prednisone, Ambien. Medications self-administered by patient during sleep study : No sleep medicine administered.  SLEEP ARCHITECTURE The study was initiated at 10:14:49 PM and ended at 5:08:33 AM.  Sleep onset time was prolonged at 54.7 minutes and the sleep efficiency was reduced at 63.0%. The total sleep time was 260.6 minutes.  REM sleep was not present.  The patient spent 27.63% of the night in stage N1 sleep, 71.98% in stage N2 sleep, 0.38% in stage N3 and 0.00% in REM.  Alpha intrusion was absent.  Supine sleep was 41.56%.  RESPIRATORY PARAMETERS The overall apnea/hypopnea index (AHI) was 18.4 per hour. There were 4 total apneas, including 0 obstructive, 4 central and 0 mixed apneas. There were 76 hypopneas and 8 RERAs.  AHI while supine was 32.1 per hour.  The mean oxygen saturation was 93.32%. The minimum SpO2 during sleep was 88.00%.  Soft snoring was noted during this study.  CARDIAC DATA The 2 lead EKG  demonstrated sinus rhythm. The mean heart rate was 54.40 beats per minute. Other EKG findings include: None.  LEG MOVEMENT DATA The total PLMS were 458 with a resulting PLMS index of 105.47. Associated arousal with leg movement index was 29.9 .  IMPRESSIONS Moderate obstructive sleep apnea occurred during this study (AHI = 18.4/h). No significant central sleep apnea occurred during this study (CAI = 0.9/h). Moderate oxygen desaturation was noted during this study (Min O2 = 88.00%). The patient snored with Soft snoring volume. No cardiac abnormalities were noted during this study. Severe periodic limb movements of sleep occurred during the study. Associated arousals were significant.  DIAGNOSIS Obstructive Sleep Apnea (327.23 [G47.33 ICD-10]) Nocturnal Hypoxemia (327.26 [G47.36 ICD-10])  RECOMMENDATIONS Therapeutic CPAP titration to determine optimal pressure required to alleviate sleep disordered breathing. Positional therapy avoiding supine position during sleep. Avoid alcohol, sedatives and other CNS depressants that may worsen sleep apnea and disrupt normal sleep architecture. Sleep hygiene should be reviewed to assess factors that may improve sleep quality. Regular exercise should be initiated or continued if appropriate. The patient should be screened for symptoms of restless legs and meds that could aggravate it reviewed.  Signed: Fransico Him, MD ABSM [place], American Board of Sleep Medicine 06/19/2015

## 2015-06-19 NOTE — Telephone Encounter (Signed)
Ok to Rf in PCP's absence? Thanks!  Fax Rx to [phone]

## 2015-06-19 NOTE — Telephone Encounter (Signed)
Please let patient know that they have sleep apnea and recommend CPAP titration. Please set up titration in the sleep lab. 

## 2015-06-20 ENCOUNTER — Encounter: Payer: Self-pay | Admitting: *Deleted

## 2015-06-20 DIAGNOSIS — M0609 Rheumatoid arthritis without rheumatoid factor, multiple sites: Secondary | ICD-10-CM | POA: Diagnosis not present

## 2015-06-20 NOTE — Addendum Note (Signed)
Addended by: Andres Ege on: 06/20/2015 08:38 AM   Modules accepted: Orders

## 2015-06-20 NOTE — Telephone Encounter (Signed)
Patient is aware of results, stated verbal understanding.  He asked that I schedule the Titration and send him a letter letting him know when that will be scheduled.

## 2015-06-24 ENCOUNTER — Inpatient Hospital Stay (HOSPITAL_COMMUNITY): Admission: RE | Admit: 2015-06-24 | Payer: Medicare Other | Source: Ambulatory Visit | Admitting: Nurse Practitioner

## 2015-06-25 ENCOUNTER — Encounter (HOSPITAL_COMMUNITY): Payer: Self-pay | Admitting: Nurse Practitioner

## 2015-06-25 ENCOUNTER — Ambulatory Visit (HOSPITAL_COMMUNITY)
Admission: RE | Admit: 2015-06-25 | Discharge: 2015-06-25 | Disposition: A | Discharge status: Home / Self Care | Payer: Medicare Other | Source: Ambulatory Visit | Attending: Nurse Practitioner | Admitting: Nurse Practitioner

## 2015-06-25 VITALS — BP 116/68 | HR 60 | Ht 72.0 in | Wt 183.4 lb

## 2015-06-25 DIAGNOSIS — Z7902 Long term (current) use of antithrombotics/antiplatelets: Secondary | ICD-10-CM | POA: Diagnosis not present

## 2015-06-25 DIAGNOSIS — I10 Essential (primary) hypertension: Secondary | ICD-10-CM | POA: Diagnosis not present

## 2015-06-25 DIAGNOSIS — I252 Old myocardial infarction: Secondary | ICD-10-CM | POA: Insufficient documentation

## 2015-06-25 DIAGNOSIS — Z79899 Other long term (current) drug therapy: Secondary | ICD-10-CM | POA: Insufficient documentation

## 2015-06-25 DIAGNOSIS — M069 Rheumatoid arthritis, unspecified: Secondary | ICD-10-CM | POA: Insufficient documentation

## 2015-06-25 DIAGNOSIS — Z8249 Family history of ischemic heart disease and other diseases of the circulatory system: Secondary | ICD-10-CM | POA: Insufficient documentation

## 2015-06-25 DIAGNOSIS — I480 Paroxysmal atrial fibrillation: Secondary | ICD-10-CM | POA: Insufficient documentation

## 2015-06-25 DIAGNOSIS — Z87891 Personal history of nicotine dependence: Secondary | ICD-10-CM | POA: Insufficient documentation

## 2015-06-25 DIAGNOSIS — M5030 Other cervical disc degeneration, unspecified cervical region: Secondary | ICD-10-CM | POA: Diagnosis not present

## 2015-06-25 DIAGNOSIS — Z7982 Long term (current) use of aspirin: Secondary | ICD-10-CM | POA: Insufficient documentation

## 2015-06-25 DIAGNOSIS — M0609 Rheumatoid arthritis without rheumatoid factor, multiple sites: Secondary | ICD-10-CM | POA: Diagnosis not present

## 2015-06-25 DIAGNOSIS — Z955 Presence of coronary angioplasty implant and graft: Secondary | ICD-10-CM | POA: Diagnosis not present

## 2015-06-25 DIAGNOSIS — I2510 Atherosclerotic heart disease of native coronary artery without angina pectoris: Secondary | ICD-10-CM | POA: Diagnosis not present

## 2015-06-25 DIAGNOSIS — G4733 Obstructive sleep apnea (adult) (pediatric): Secondary | ICD-10-CM | POA: Diagnosis not present

## 2015-06-25 DIAGNOSIS — E785 Hyperlipidemia, unspecified: Secondary | ICD-10-CM | POA: Insufficient documentation

## 2015-06-25 DIAGNOSIS — K219 Gastro-esophageal reflux disease without esophagitis: Secondary | ICD-10-CM | POA: Insufficient documentation

## 2015-06-25 NOTE — Patient Instructions (Signed)
December parking code [id number]

## 2015-06-25 NOTE — Progress Notes (Signed)
Patient ID: [name], male   DOB: January 31, 1933, 81 y.o.   MRN: [id number]     Primary Care Physician: [name], MD Referring Physician: Dr. Farrel Gordon is a 81 y.o. male with a h/o CAD, STEMI,  HTN, Rheumatoid arthritis, newly diagnosed sleep apnea, that is in the afib clinic for f/u from Dr. Rayann Heman and new onset afib as of April of this year.  He was sen in the ER and converted. On DOAC for chadsvasc of at least 4.  So far pt has not had any further episodes. Has not identified an trigger. Has reduced alcohol and caffeine. Pending return to sleep lab for CPAP to be titrated. Low risk myoview 1/16.[name] He is active and walks daily and plays golf couple times a week, also goes to gym several days a week.   Today, he denies symptoms of palpitations, chest pain, shortness of breath, orthopnea, PND, lower extremity edema, dizziness, presyncope, syncope, or neurologic sequela. The patient is tolerating medications without difficulties and is otherwise without complaint today.   Past Medical History  Diagnosis Date  . Colon polyp   . Hyperlipidemia   . Osteoarthritis   . Migraine with aura   . RA (rheumatoid arthritis)   . Premature atrial contractions   . GERD (gastroesophageal reflux disease)   . Hypertension   . CAD (coronary artery disease) 12/2013    s/p inferior STEMI 01/08/2014; LHC 01/08/14: total RCA occlusion s/p 3.5x69mm Xience DES distal RCA and 3.5x28 mm DES mid RCA, 60-70% mid LAD stenosis, EF 55%  . Palpitations   . STEMI (ST elevation myocardial infarction)   . Hx of echocardiogram     ETT- Myoview (1/16):  diaph atten, EF 63%, Low Risk  . Paroxysmal atrial fibrillation 4/16    chads2vasc score is at least 4  . OSA (obstructive sleep apnea) 06/19/2015    Moderate OSA with AHI 18/hr   Past Surgical History  Procedure Laterality Date  . Back surgery  2004  . Left heart catheterization with coronary angiogram N/A 01/08/2014    Procedure: LEFT HEART  CATHETERIZATION WITH CORONARY ANGIOGRAM;  Surgeon: Troy Sine, MD;  Location: [facility] CATH LAB;  Service: Cardiovascular;  Laterality: N/A;  . Percutaneous coronary stent intervention (pci-s)  01/08/2014    Procedure: PERCUTANEOUS CORONARY STENT INTERVENTION (PCI-S);  Surgeon: Troy Sine, MD;  Location: [facility] CATH LAB;  Service: Cardiovascular;;    Current Outpatient Prescriptions  Medication Sig Dispense Refill  . Abatacept (ORENCIA [place]) Inject 1 Applicatorful into the skin See admin instructions. Every 4 weeks    . aspirin 81 MG tablet Take 81 mg by mouth daily.    . Cholecalciferol (VITAMIN D) 1000 UNITS capsule Take 1,000 Units by mouth daily.     . CRESTOR 10 MG tablet TAKE ONE TABLET EACH DAY 30 tablet 10  . Cyanocobalamin 2500 MCG SUBL Place 1 tablet under the tongue daily.     . DULoxetine (CYMBALTA) 60 MG capsule TAKE ONE CAPSULE EACH DAY (Patient taking differently: TAKE ONE CAPSULE BY MOUTH EACH DAY) 30 capsule 5  . ELIQUIS 5 MG TABS tablet TAKE ONE TABLET TWICE DAILY 60 tablet 6  . ezetimibe (ZETIA) 10 MG tablet Take 5 mg by mouth daily. 45 tablet 1  . lisinopril (PRINIVIL,ZESTRIL) 5 MG tablet TAKE ONE TABLET BY MOUTH ONCE DAILY 90 tablet 1  . LORazepam (ATIVAN) 0.5 MG tablet TAKE 1 TO 2 TABLETS TWICE A DAY AS NEEDED FOR ANXIETY (Patient  taking differently: TAKE 1 TO 2 TABLETS BY MOUTH TWICE A DAY AS NEEDED FOR ANXIETY) 60 tablet 5  . metoprolol tartrate (LOPRESSOR) 25 MG tablet Take 1.5 tablets (37.5 mg total) by mouth 2 (two) times daily. 270 tablet 3  . nitroGLYCERIN (NITROSTAT) 0.4 MG SL tablet Place 1 tablet (0.4 mg total) under the tongue every 5 (five) minutes x 3 doses as needed for chest pain. (Patient taking differently: Place 0.4 mg under the tongue every 5 (five) minutes x 3 doses as needed for chest pain (MAX 3 TABLETS). ) 30 tablet 12  . omeprazole (PRILOSEC) 40 MG capsule Take 40 mg by mouth daily.    [name] oxyCODONE-acetaminophen (PERCOCET/ROXICET) 5-325 MG per tablet Take 1  tablet by mouth every 6 (six) hours as needed for severe pain. 100 tablet 0  . predniSONE (DELTASONE) 1 MG tablet Take 3 mg by mouth daily with breakfast.     . Propylene Glycol (SYSTANE BALANCE OP) Apply 1 drop to eye daily as needed (dry eyeS).     [name] zolpidem (AMBIEN) 10 MG tablet TAKE 1 TABLET AT BEDTIME AS NEEDED FOR SLEEP 90 tablet 0   No current facility-administered medications for this encounter.    Allergies  Allergen Reactions  . Methotrexate Other (See Comments)    REACTION: tachycardia  . Atorvastatin Other (See Comments)    Muscle pain, leg cramps  . Pravastatin Sodium Other (See Comments)    REACTION: cramps, fatigue    Social History   Social History  . Marital Status: Married    Spouse Name: N/A  . Number of Children: 2  . Years of Education: N/A   Occupational History  . Retired    Social History Main Topics  . Smoking status: Former Research scientist (life sciences)  . Smokeless tobacco: Never Used     Comment: Quit 50 yrs ago (as of 2015)  . Alcohol Use: 4.2 - 8.4 oz/week    7-14 Glasses of wine per week     Comment: 1-2 glasses of wine nightly  . Drug Use: No  . Sexual Activity: Yes   Other Topics Concern  . Not on file   Social History Narrative   1 Caffeine drink daily     Family History  Problem Relation Age of Onset  . Colon cancer Neg Hx   . Hypertension Mother   . Heart disease Father     ROS- All systems are reviewed and negative except as per the HPI above  Physical Exam: Filed Vitals:   06/25/15 1117  BP: 116/68  Pulse: 60  Height: 6' (1.829 m)  Weight: 183 lb 6.4 oz (83.19 kg)    GEN- The patient is well appearing, alert and oriented x 3 today.   Head- normocephalic, atraumatic Eyes-  Sclera clear, conjunctiva pink Ears- hearing intact Oropharynx- clear Neck- supple, no JVP Lymph- no cervical lymphadenopathy Lungs- Clear to ausculation bilaterally, normal work of breathing Heart- Regular rate and rhythm, no murmurs, rubs or gallops, PMI not  laterally displaced GI- soft, NT, ND, + BS Extremities- no clubbing, cyanosis, or edema MS- no significant deformity or atrophy Skin- no rash or lesion Psych- euthymic mood, full affect Neuro- strength and sensation are intact  EKG- NSR, normal EKG, Pr int 178 ms, QRS 88 ms, QTc 420 ms. Epic records reviewed.   Assessment and Plan:  1. PAF Quiet, maintaining SR Reviewed triggers for afib and discussed lifestyle modification Continue metoprolol and can take an extra 1/2 to 1 tab of metoprolol for  breakthrough afib. Continue eliquis 5 mg bid  2. HTN Stable  3. CAD  Stable   4. OSA Positive sleep apnea test and is pending titration of cpap in the sleep lab.   Return to afib clinic in early December.  Geroge Baseman [place], [facility] [address] [place], [place] [address] [phone]

## 2015-07-07 DIAGNOSIS — Z85828 Personal history of other malignant neoplasm of skin: Secondary | ICD-10-CM | POA: Diagnosis not present

## 2015-07-07 DIAGNOSIS — L570 Actinic keratosis: Secondary | ICD-10-CM | POA: Diagnosis not present

## 2015-07-07 DIAGNOSIS — C44722 Squamous cell carcinoma of skin of right lower limb, including hip: Secondary | ICD-10-CM | POA: Diagnosis not present

## 2015-07-07 DIAGNOSIS — L821 Other seborrheic keratosis: Secondary | ICD-10-CM | POA: Diagnosis not present

## 2015-07-18 DIAGNOSIS — M0609 Rheumatoid arthritis without rheumatoid factor, multiple sites: Secondary | ICD-10-CM | POA: Diagnosis not present

## 2015-08-02 ENCOUNTER — Other Ambulatory Visit: Payer: Self-pay | Admitting: Internal Medicine

## 2015-08-12 ENCOUNTER — Ambulatory Visit (HOSPITAL_BASED_OUTPATIENT_CLINIC_OR_DEPARTMENT_OTHER): Payer: Medicare Other | Attending: Cardiology

## 2015-08-12 DIAGNOSIS — G4733 Obstructive sleep apnea (adult) (pediatric): Secondary | ICD-10-CM | POA: Insufficient documentation

## 2015-08-12 DIAGNOSIS — R0683 Snoring: Secondary | ICD-10-CM | POA: Insufficient documentation

## 2015-08-12 DIAGNOSIS — G4730 Sleep apnea, unspecified: Secondary | ICD-10-CM | POA: Diagnosis present

## 2015-08-13 ENCOUNTER — Telehealth: Payer: Self-pay | Admitting: Cardiology

## 2015-08-13 NOTE — Sleep Study (Signed)
   Patient Name: [name], [name] MRN: [id number] Study Date: 08/12/2015 Gender: Male D.O.B: 06-Mar-1933 Age (years): 48 Referring Provider: Thompson Grayer Interpreting Physician: Fransico Him MD, ABSM RPSGT: Madelon Lips  Weight (lbs): 180 Height (inches): 72 BMI: 24 Neck Size: 16.50  CLINICAL INFORMATION The patient is referred for a CPAP titration to treat sleep apnea.  Date of NPSG, Split Night or HST:06/19/2015  SLEEP STUDY TECHNIQUE As per the AASM Manual for the Scoring of Sleep and Associated Events v2.3 (April 2016) with a hypopnea requiring 4% desaturations.  The channels recorded and monitored were frontal, central and occipital EEG, electrooculogram (EOG), submentalis EMG (chin), nasal and oral airflow, thoracic and abdominal wall motion, anterior tibialis EMG, snore microphone, electrocardiogram, and pulse oximetry. Continuous positive airway pressure (CPAP) was initiated at the beginning of the study and titrated to treat sleep-disordered breathing.  MEDICATIONS Medications taken by the patient : [place], ASA, Vit D, Crestor, Cymbalta, Eliquis, Zetia, Lisinopril, Ativan, Metoprolol, Percocet, Prilosec, Prednisone, Ambien. Medications administered by patient during sleep study : No sleep medicine administered.  TECHNICIAN COMMENTS Comments added by technician: PATIENT TOLERATED CPAP WITHOUT ANY DIFFICULTY  Comments added by scorer: N/A  RESPIRATORY PARAMETERS Optimal PAP Pressure (cm): 8cm H2O  AHI at Optimal Pressure (/hr):1.2 Overall Minimal O2 (%):91.00  Supine % at Optimal Pressure (%):N/A Minimal O2 at Optimal Pressure (%):93.00    SLEEP ARCHITECTURE The study was initiated at 10:28:39 PM and ended at 5:07:52 AM.  Sleep onset time was 51.0 minutes and the sleep efficiency was reduced at 59.6%. The total sleep time was 238.0 minutes.  The patient spent 29.62% of the night in stage N1 sleep, 70.38% in stage N2 sleep, 0.00% in stage N3 and 0.00% in REM.Stage REM  latency was N/A minutes  Wake after sleep onset was 110.2. Alpha intrusion was absent. Supine sleep was 100.00%.  CARDIAC DATA The 2 lead EKG demonstrated sinus rhythm. The mean heart rate was 50.91 beats per minute. Other EKG findings include: None.  LEG MOVEMENT DATA The total Periodic Limb Movements of Sleep (PLMS) were 0. The PLMS index was 0.00. A PLMS index of <15 is considered normal in adults.  IMPRESSIONS An optimal PAP pressure of 8cm H2O was selected for this patient based on the available study data. Central sleep apnea was not noted during this titration (CAI = 1.0/h). Significant oxygen desaturations were not observed during this titration (min O2 = 91.00%). The patient snored with Soft snoring volume during this titration study. No cardiac abnormalities were observed during this study. Clinically significant periodic limb movements were not noted during this study. Arousals associated with PLMs were rare.  DIAGNOSIS Obstructive Sleep Apnea (327.23 [G47.33 ICD-10])  RECOMMENDATIONS Recommend CPAP at 8cm H2O with heated humidity and mask of choice. Avoid alcohol, sedatives and other CNS depressants that may worsen sleep apnea and disrupt normal sleep architecture. Sleep hygiene should be reviewed to assess factors that may improve sleep quality. Weight management and regular exercise should be initiated or continued. Followup in sleep clinic in 10 weeks and download in 4 weeks to ensure compliance.   [place], American Board of Sleep Medicine  ELECTRONICALLY SIGNED ON:  08/13/2015, 5:43 PM [place] PH: [phone]   FX: [phone] [place]

## 2015-08-13 NOTE — Addendum Note (Signed)
Addended by: Sueanne Margarita on: 08/13/2015 05:57 PM   Modules accepted: Orders

## 2015-08-13 NOTE — Telephone Encounter (Signed)
Pt had successful PAP titration. Please setup appointment in 10 weeks. Please let [facility] know that order for PAP is in EPIC.   

## 2015-08-15 DIAGNOSIS — M0609 Rheumatoid arthritis without rheumatoid factor, multiple sites: Secondary | ICD-10-CM | POA: Diagnosis not present

## 2015-08-19 NOTE — Telephone Encounter (Signed)
Patient is aware of results.  [place] Notified. Once he receives his machine we will schedule 10 week follow-up appt.

## 2015-08-21 ENCOUNTER — Other Ambulatory Visit: Payer: Self-pay | Admitting: Internal Medicine

## 2015-08-22 ENCOUNTER — Telehealth: Payer: Self-pay

## 2015-08-22 NOTE — Telephone Encounter (Signed)
Patient called asking for advice on what he can take for pain for his Arthritis while taking Eliquis. ( He said he was told he couldn't take aspirin type products when he was on Brilenta.)

## 2015-09-09 ENCOUNTER — Other Ambulatory Visit: Payer: Self-pay | Admitting: Internal Medicine

## 2015-09-12 ENCOUNTER — Ambulatory Visit: Payer: Medicare Other

## 2015-09-12 DIAGNOSIS — M0609 Rheumatoid arthritis without rheumatoid factor, multiple sites: Secondary | ICD-10-CM | POA: Diagnosis not present

## 2015-09-16 ENCOUNTER — Ambulatory Visit (INDEPENDENT_AMBULATORY_CARE_PROVIDER_SITE_OTHER): Payer: Medicare Other

## 2015-09-16 DIAGNOSIS — Z23 Encounter for immunization: Secondary | ICD-10-CM

## 2015-09-24 ENCOUNTER — Encounter: Payer: Self-pay | Admitting: Cardiology

## 2015-09-24 DIAGNOSIS — M5030 Other cervical disc degeneration, unspecified cervical region: Secondary | ICD-10-CM | POA: Diagnosis not present

## 2015-09-24 DIAGNOSIS — M0609 Rheumatoid arthritis without rheumatoid factor, multiple sites: Secondary | ICD-10-CM | POA: Diagnosis not present

## 2015-10-07 ENCOUNTER — Ambulatory Visit (INDEPENDENT_AMBULATORY_CARE_PROVIDER_SITE_OTHER): Payer: Medicare Other | Admitting: Nurse Practitioner

## 2015-10-07 ENCOUNTER — Encounter: Payer: Self-pay | Admitting: Nurse Practitioner

## 2015-10-07 VITALS — BP 140/80 | HR 55 | Ht 72.0 in | Wt 187.0 lb

## 2015-10-07 DIAGNOSIS — I10 Essential (primary) hypertension: Secondary | ICD-10-CM

## 2015-10-07 DIAGNOSIS — E782 Mixed hyperlipidemia: Secondary | ICD-10-CM | POA: Diagnosis not present

## 2015-10-07 DIAGNOSIS — I2510 Atherosclerotic heart disease of native coronary artery without angina pectoris: Secondary | ICD-10-CM

## 2015-10-07 DIAGNOSIS — I25119 Atherosclerotic heart disease of native coronary artery with unspecified angina pectoris: Secondary | ICD-10-CM

## 2015-10-07 DIAGNOSIS — I480 Paroxysmal atrial fibrillation: Secondary | ICD-10-CM | POA: Diagnosis not present

## 2015-10-07 DIAGNOSIS — I4891 Unspecified atrial fibrillation: Secondary | ICD-10-CM | POA: Diagnosis not present

## 2015-10-07 LAB — BASIC METABOLIC PANEL
BUN: 15 mg/dL (ref 7–25)
CO2: 28 mmol/L (ref 20–31)
Calcium: 9.1 mg/dL (ref 8.6–10.3)
Chloride: 104 mmol/L (ref 98–110)
Creat: 0.87 mg/dL (ref 0.70–1.11)
Glucose, Bld: 95 mg/dL (ref 65–99)
Potassium: 4 mmol/L (ref 3.5–5.3)
Sodium: 139 mmol/L (ref 135–146)

## 2015-10-07 LAB — HEPATIC FUNCTION PANEL
ALT: 17 U/L (ref 9–46)
AST: 18 U/L (ref 10–35)
Albumin: 3.9 g/dL (ref 3.6–5.1)
Alkaline Phosphatase: 51 U/L (ref 40–115)
Bilirubin, Direct: 0.1 mg/dL (ref <=0.2)
Indirect Bilirubin: 0.5 mg/dL (ref 0.2–1.2)
Total Bilirubin: 0.6 mg/dL (ref 0.2–1.2)
Total Protein: 6.8 g/dL (ref 6.1–8.1)

## 2015-10-07 LAB — CBC
HCT: 38.1 % — ABNORMAL LOW (ref 39.0–52.0)
Hemoglobin: 13.1 g/dL (ref 13.0–17.0)
MCH: 32.8 pg (ref 26.0–34.0)
MCHC: 34.4 g/dL (ref 30.0–36.0)
MCV: 95.3 fL (ref 78.0–100.0)
MPV: 8.6 fL (ref 8.6–12.4)
Platelets: 275 10*3/uL (ref 150–400)
RBC: 4 MIL/uL — ABNORMAL LOW (ref 4.22–5.81)
RDW: 14 % (ref 11.5–15.5)
WBC: 5.6 10*3/uL (ref 4.0–10.5)

## 2015-10-07 LAB — TSH: TSH: 1.015 u[IU]/mL (ref 0.350–4.500)

## 2015-10-07 NOTE — Patient Instructions (Addendum)
We will be checking the following labs today - BMET, CBC, HPF, TSH   Medication Instructions:    Continue with your current medicines.     Testing/Procedures To Be Arranged:  Event monitor  Follow-Up:   See back as planned.     Other Special Instructions:   N/A    If you need a refill on your cardiac medications before your next appointment, please call your pharmacy.   Call the [place] office at [phone] if you have any questions, problems or concerns.

## 2015-10-07 NOTE — Progress Notes (Signed)
CARDIOLOGY OFFICE NOTE  Date:  10/07/2015    [name] Date of Birth: July 03, 1933 Medical Record [id number]  PCP:  [name], MD  Cardiologist:  Allred    Chief Complaint  Patient presents with  . Atrial Fibrillation    Seen for Dr. Rayann Heman.   . Coronary Artery Disease    History of Present Illness: [name] is a 81 y.o. male who presents today for a follow up visit. Seen for Dr. Rayann Heman. He is scheduled in the AF clinic in early December.   He has a history of PAF, on chronic anticoagulation, known CAD with inferior MI 12/2013 with PCI to RCA and has residual disease with normal EF. Other issues include RA, HLD, migraines and HTN. He has had past alcohol use. Low risk Myoview from January of 2016.   Last seen in July. Seemed to be doing ok. Noted he had cut back on alcohol intake.   Comes back today. Here alone. Says this is an old appointment but here at the urging of his wife. Has had about 3 spells over the past 6 months where he gets nauseated, hot and breaks out in a sweat - lasts for 30 minutes and then it resolves. Last spell was last Thursday - was out of town. Did actually vomit with this spell. Was just lying in the bed - not exerting himself. Not really dizzy or lightheaded. No chest pain. He did check his HR - it was steady but did not really count for a number. He was then going to go dinner and had not eaten in a few hours but he had eaten that day. Stays active and is exercising. He has not passed out. No chest pain whatsoever. BP is good at home.   Past Medical History  Diagnosis Date  . Colon polyp   . Hyperlipidemia   . Osteoarthritis   . Migraine with aura   . RA (rheumatoid arthritis) ([place])   . Premature atrial contractions   . GERD (gastroesophageal reflux disease)   . Hypertension   . CAD (coronary artery disease) 12/2013    s/p inferior STEMI 01/08/2014; LHC 01/08/14: total RCA occlusion s/p 3.5x48mm Xience DES distal RCA and 3.5x28 mm DES  mid RCA, 60-70% mid LAD stenosis, EF 55%  . Palpitations   . STEMI (ST elevation myocardial infarction) ([place])   . Hx of echocardiogram     ETT- Myoview (1/16):  diaph atten, EF 63%, Low Risk  . Paroxysmal atrial fibrillation ([place]) 4/16    chads2vasc score is at least 4  . OSA (obstructive sleep apnea) 06/19/2015    Moderate OSA with AHI 18/hr    Past Surgical History  Procedure Laterality Date  . Back surgery  2004  . Left heart catheterization with coronary angiogram N/A 01/08/2014    Procedure: LEFT HEART CATHETERIZATION WITH CORONARY ANGIOGRAM;  Surgeon: Troy Sine, MD;  Location: [facility] CATH LAB;  Service: Cardiovascular;  Laterality: N/A;  . Percutaneous coronary stent intervention (pci-s)  01/08/2014    Procedure: PERCUTANEOUS CORONARY STENT INTERVENTION (PCI-S);  Surgeon: Troy Sine, MD;  Location: [facility] CATH LAB;  Service: Cardiovascular;;     Medications: Current Outpatient Prescriptions  Medication Sig Dispense Refill  . Abatacept (ORENCIA [place]) Inject 1 Applicatorful into the skin See admin instructions. Every 4 weeks    . aspirin 81 MG tablet Take 81 mg by mouth daily.    . Cholecalciferol (VITAMIN D) 1000 UNITS capsule Take 1,000 Units  by mouth daily.     . CRESTOR 10 MG tablet TAKE ONE TABLET EACH DAY 30 tablet 10  . Cyanocobalamin 2500 MCG SUBL Place 1 tablet under the tongue daily.     . DULoxetine (CYMBALTA) 60 MG capsule TAKE ONE CAPSULE EACH DAY 30 capsule 3  . ELIQUIS 5 MG TABS tablet TAKE ONE TABLET TWICE DAILY 60 tablet 6  . lisinopril (PRINIVIL,ZESTRIL) 5 MG tablet TAKE ONE TABLET BY MOUTH ONCE DAILY 90 tablet 1  . LORazepam (ATIVAN) 0.5 MG tablet TAKE 1 TO 2 TABLETS TWICE A DAY AS NEEDED FOR ANXIETY (Patient taking differently: TAKE 1 TO 2 TABLETS BY MOUTH TWICE A DAY AS NEEDED FOR ANXIETY) 60 tablet 5  . metoprolol tartrate (LOPRESSOR) 25 MG tablet Take 1.5 tablets (37.5 mg total) by mouth 2 (two) times daily. 270 tablet 3  . nitroGLYCERIN (NITROSTAT) 0.4 MG SL  tablet Place 1 tablet (0.4 mg total) under the tongue every 5 (five) minutes x 3 doses as needed for chest pain. (Patient taking differently: Place 0.4 mg under the tongue every 5 (five) minutes x 3 doses as needed for chest pain (MAX 3 TABLETS). ) 30 tablet 12  . omeprazole (PRILOSEC) 40 MG capsule TAKE ONE CAPSULE EACH DAY 90 capsule 3  . oxyCODONE-acetaminophen (PERCOCET/ROXICET) 5-325 MG per tablet Take 1 tablet by mouth every 6 (six) hours as needed for severe pain. 100 tablet 0  . predniSONE (DELTASONE) 1 MG tablet Take 3 mg by mouth daily with breakfast.     . Propylene Glycol (SYSTANE BALANCE OP) Apply 1 drop to eye daily as needed (dry eyeS).     [name] ZETIA 10 MG tablet TAKE ONE-HALF TABLET DAILY 45 tablet 6  . zolpidem (AMBIEN) 10 MG tablet TAKE 1 TABLET AT BEDTIME AS NEEDED FOR SLEEP 90 tablet 0   No current facility-administered medications for this visit.    Allergies: Allergies  Allergen Reactions  . Methotrexate Other (See Comments)    REACTION: tachycardia  . Atorvastatin Other (See Comments)    Muscle pain, leg cramps  . Pravastatin Sodium Other (See Comments)    REACTION: cramps, fatigue    Social History: The patient  reports that he has quit smoking. He has never used smokeless tobacco. He reports that he drinks about 4.2 - 8.4 oz of alcohol per week. He reports that he does not use illicit drugs.   Family History: The patient's family history includes Heart disease in his father; Hypertension in his mother. There is no history of Colon cancer.   Review of Systems: Please see the history of present illness.   Otherwise, the review of systems is positive for none.   All other systems are reviewed and negative.   Physical Exam: VS:  BP 140/80 mmHg  Pulse 55  Ht 6' (1.829 m)  Wt 187 lb (84.823 kg)  BMI 25.36 kg/m2  SpO2 95% .  BMI Body mass index is 25.36 kg/(m^2).  Wt Readings from Last 3 Encounters:  10/07/15 187 lb (84.823 kg)  08/12/15 180 lb (81.647 kg)    06/25/15 183 lb 6.4 oz (83.19 kg)   BP recheck by me is 110/60.  General: Pleasant. Looks younger than his stated age. Well developed, well nourished and in no acute distress.  HEENT: Normal. Neck: Supple, no JVD, carotid bruits, or masses noted.  Cardiac: Regular rate and rhythm. No murmurs, rubs, or gallops. No edema.  Respiratory:  Lungs are clear to auscultation bilaterally with normal work of breathing.  GI: Soft and nontender.  MS: No deformity or atrophy. Gait and ROM intact. Skin: Warm and dry. Color is normal.  Neuro:  Strength and sensation are intact and no gross focal deficits noted.  Psych: Alert, appropriate and with normal affect.   LABORATORY DATA:  EKG:  EKG is not ordered today.  Lab Results  Component Value Date   WBC 6.0 02/20/2015   HGB 12.8* 02/20/2015   HCT 38.0* 02/20/2015   PLT 234 02/20/2015   GLUCOSE 118* 02/20/2015   CHOL 145 12/10/2014   TRIG 168.0* 12/10/2014   HDL 53.10 12/10/2014   LDLDIRECT 168.9 05/24/2013   LDLCALC 58 12/10/2014   ALT 27 12/10/2014   AST 25 12/10/2014   NA 140 02/20/2015   K 3.9 02/20/2015   CL 109 02/20/2015   CREATININE 1.02 02/20/2015   BUN 17 02/20/2015   CO2 25 02/20/2015   TSH 0.737 01/08/2014   PSA 1.52 06/04/2011   INR 0.98 02/20/2015   HGBA1C 6.2* 01/08/2014    BNP (last 3 results) No results for input(s): BNP in the last 8760 hours.  ProBNP (last 3 results) No results for input(s): PROBNP in the last 8760 hours.   Other Studies Reviewed Today:  Myoview Impression from 11/2014 Exercise Capacity: Fair exercise capacity. BP Response: Hypertensive blood pressure response. Clinical Symptoms: No significant symptoms noted. ECG Impression: No significant ST segment change suggestive of ischemia. Comparison with Prior Nuclear Study: No previous nuclear study performed  Overall Impression: Low risk stress nuclear study with a small diaphragmatic attenuation artifact.  LV Ejection Fraction: 63%.  LV Wall Motion: NL LV Function; NL Wall Motion   Sanda Klein, MD, [facility] [phone] office [phone] pager  Echo Study Conclusions from 12/2014  Left ventricle: The cavity size was normal. Wall thickness was increased in a pattern of mild LVH. There was focal basal hypertrophy. Systolic function was normal. The estimated ejection fraction was in the range of 55% to 60%. Wall motion was normal; there were no regional wall motion abnormalities.      Assessment/Plan: 1. PAF - in sinus by exam today.   2. CAD - no active symptoms  3. Chronic anticoagulation - no problems noted.  4. HTN - BP by me is good on his current regimen. No changes made  5. HLD - on statin  6. Episodes of what sounds like vagal response/presyncope - ?SSS. Will check lab. Arrange for event monitor. His spells are not frequent but hopefully over this next month we will be able to "catch" one.   Current medicines are reviewed with the patient today.  The patient does not have concerns regarding medicines other than what has been noted above.  The following changes have been made:  See above.  Labs/ tests ordered today include:    Orders Placed This Encounter  Procedures  . Basic metabolic panel  . CBC  . Hepatic function panel  . TSH  . Cardiac event monitor     Disposition:   FU with Dr. Rayann Heman and his team going forward.   Patient is agreeable to this plan and will call if any problems develop in the interim.   Signed: Burtis Junes, RN, ANP-C 10/07/2015 9:03 AM  [facility] [address] [place], [place]  [address] Phone: [phone] Fax: [phone]

## 2015-10-13 DIAGNOSIS — M0609 Rheumatoid arthritis without rheumatoid factor, multiple sites: Secondary | ICD-10-CM | POA: Diagnosis not present

## 2015-10-14 ENCOUNTER — Ambulatory Visit (INDEPENDENT_AMBULATORY_CARE_PROVIDER_SITE_OTHER): Payer: Medicare Other

## 2015-10-14 DIAGNOSIS — E782 Mixed hyperlipidemia: Secondary | ICD-10-CM | POA: Diagnosis not present

## 2015-10-14 DIAGNOSIS — I2510 Atherosclerotic heart disease of native coronary artery without angina pectoris: Secondary | ICD-10-CM

## 2015-10-14 DIAGNOSIS — I10 Essential (primary) hypertension: Secondary | ICD-10-CM

## 2015-10-14 DIAGNOSIS — I480 Paroxysmal atrial fibrillation: Secondary | ICD-10-CM

## 2015-10-15 ENCOUNTER — Telehealth: Payer: Self-pay | Admitting: Internal Medicine

## 2015-10-16 DIAGNOSIS — D485 Neoplasm of uncertain behavior of skin: Secondary | ICD-10-CM | POA: Diagnosis not present

## 2015-10-16 DIAGNOSIS — Z85828 Personal history of other malignant neoplasm of skin: Secondary | ICD-10-CM | POA: Diagnosis not present

## 2015-10-16 DIAGNOSIS — L570 Actinic keratosis: Secondary | ICD-10-CM | POA: Diagnosis not present

## 2015-10-17 ENCOUNTER — Encounter: Payer: Self-pay | Admitting: Internal Medicine

## 2015-10-17 ENCOUNTER — Ambulatory Visit (INDEPENDENT_AMBULATORY_CARE_PROVIDER_SITE_OTHER): Payer: Medicare Other | Admitting: Internal Medicine

## 2015-10-17 VITALS — BP 112/72 | HR 80 | Temp 97.8°F | Ht 72.0 in | Wt 188.0 lb

## 2015-10-17 DIAGNOSIS — M069 Rheumatoid arthritis, unspecified: Secondary | ICD-10-CM

## 2015-10-17 DIAGNOSIS — J329 Chronic sinusitis, unspecified: Secondary | ICD-10-CM | POA: Diagnosis not present

## 2015-10-17 DIAGNOSIS — I10 Essential (primary) hypertension: Secondary | ICD-10-CM | POA: Diagnosis not present

## 2015-10-17 DIAGNOSIS — I25119 Atherosclerotic heart disease of native coronary artery with unspecified angina pectoris: Secondary | ICD-10-CM

## 2015-10-17 HISTORY — DX: Chronic sinusitis, unspecified: J32.9

## 2015-10-17 MED ORDER — MONTELUKAST SODIUM 10 MG PO TABS: 10.0000 mg | ORAL_TABLET | Freq: Every day | ORAL | Status: DC

## 2015-10-17 MED ORDER — METHYLPREDNISOLONE ACETATE 80 MG/ML IJ SUSP
80.0000 mg | Freq: Once | INTRAMUSCULAR | Status: AC
  Administered 2015-10-17: 80 mg via INTRAMUSCULAR

## 2015-10-17 NOTE — Progress Notes (Signed)
Pre visit review using our clinic review tool, if applicable. No additional management support is needed unless otherwise documented below in the visit note. 

## 2015-10-17 NOTE — Assessment & Plan Note (Signed)
Quiet now without joint pain on low dose prednison and orencia,  to f/u any worsening symptoms or concerns

## 2015-10-17 NOTE — Assessment & Plan Note (Signed)
stable overall by history and exam, recent data reviewed with pt, and pt to continue medical treatment as before,  to f/u any worsening symptoms or concerns BP Readings from Last 3 Encounters:  10/17/15 112/72  10/07/15 140/80  06/25/15 116/68

## 2015-10-17 NOTE — Addendum Note (Signed)
Addended by: Lyman Bishop on: 10/17/2015 09:11 AM   Modules accepted: Orders

## 2015-10-17 NOTE — Patient Instructions (Addendum)
You had the steroid shot today -   Please take all new medication as prescribed - the singulair 10 mg per day  You should also start allegra 180 mg per day (OTC) and Nasacort Aq as directed (2 spray twice daily)  Please continue all other medications as before, and refills have been done if requested.  Please have the pharmacy call with any other refills you may need.  Please continue your efforts at being more active, low cholesterol diet, and weight control.  You are otherwise up to date with prevention measures today.  Please keep your appointments with your specialists as you may have planned

## 2015-10-17 NOTE — Progress Notes (Signed)
Subjective:    Patient ID: [name], male    DOB: 10-Oct-1933, 81 y.o.   MRN: [id number]  HPI   Here with 2 wks acute onset  bilat head and facial pain, pressure, headache, but without general weakness, ST and cough, and pt denies chest pain, wheezing, increased sob or doe, orthopnea, PND, increased LE swelling, palpitations, dizziness or syncope.  Seems to something stuck in his sinuses that he cannot seem to mobilize except slightly with nettie pott.  Has long hx of allergies tx by [facility] [place] years ago, nasacort helped,  Has not tried any OTC.  Zyrtec dries him out Past Medical History  Diagnosis Date  . Colon polyp   . Hyperlipidemia   . Osteoarthritis   . Migraine with aura   . RA (rheumatoid arthritis) ([place])   . Premature atrial contractions   . GERD (gastroesophageal reflux disease)   . Hypertension   . CAD (coronary artery disease) 12/2013    s/p inferior STEMI 01/08/2014; LHC 01/08/14: total RCA occlusion s/p 3.5x56mm Xience DES distal RCA and 3.5x28 mm DES mid RCA, 60-70% mid LAD stenosis, EF 55%  . Palpitations   . STEMI (ST elevation myocardial infarction) ([place])   . Hx of echocardiogram     ETT- Myoview (1/16):  diaph atten, EF 63%, Low Risk  . Paroxysmal atrial fibrillation ([place]) 4/16    chads2vasc score is at least 4  . OSA (obstructive sleep apnea) 06/19/2015    Moderate OSA with AHI 18/hr   Past Surgical History  Procedure Laterality Date  . Back surgery  2004  . Left heart catheterization with coronary angiogram N/A 01/08/2014    Procedure: LEFT HEART CATHETERIZATION WITH CORONARY ANGIOGRAM;  Surgeon: Troy Sine, MD;  Location: [facility] CATH LAB;  Service: Cardiovascular;  Laterality: N/A;  . Percutaneous coronary stent intervention (pci-s)  01/08/2014    Procedure: PERCUTANEOUS CORONARY STENT INTERVENTION (PCI-S);  Surgeon: Troy Sine, MD;  Location: [facility] CATH LAB;  Service: Cardiovascular;;    reports that he has quit smoking. He has never used smokeless tobacco.  He reports that he drinks about 4.2 - 8.4 oz of alcohol per week. He reports that he does not use illicit drugs. family history includes Heart disease in his father; Hypertension in his mother. There is no history of Colon cancer. Allergies  Allergen Reactions  . Methotrexate Other (See Comments)    REACTION: tachycardia  . Atorvastatin Other (See Comments)    Muscle pain, leg cramps  . Pravastatin Sodium Other (See Comments)    REACTION: cramps, fatigue   Current Outpatient Prescriptions on File Prior to Visit  Medication Sig Dispense Refill  . Abatacept (ORENCIA [place]) Inject 1 Applicatorful into the skin See admin instructions. Every 4 weeks    . aspirin 81 MG tablet Take 81 mg by mouth daily.    . Cholecalciferol (VITAMIN D) 1000 UNITS capsule Take 1,000 Units by mouth daily.     . CRESTOR 10 MG tablet TAKE ONE TABLET EACH DAY 30 tablet 10  . Cyanocobalamin 2500 MCG SUBL Place 1 tablet under the tongue daily.     . DULoxetine (CYMBALTA) 60 MG capsule TAKE ONE CAPSULE EACH DAY 30 capsule 3  . ELIQUIS 5 MG TABS tablet TAKE ONE TABLET TWICE DAILY 60 tablet 6  . lisinopril (PRINIVIL,ZESTRIL) 5 MG tablet TAKE ONE TABLET BY MOUTH ONCE DAILY 90 tablet 1  . LORazepam (ATIVAN) 0.5 MG tablet TAKE 1 TO 2 TABLETS TWICE A DAY AS  NEEDED FOR ANXIETY (Patient taking differently: TAKE 1 TO 2 TABLETS BY MOUTH TWICE A DAY AS NEEDED FOR ANXIETY) 60 tablet 5  . metoprolol tartrate (LOPRESSOR) 25 MG tablet Take 1.5 tablets (37.5 mg total) by mouth 2 (two) times daily. 270 tablet 3  . nitroGLYCERIN (NITROSTAT) 0.4 MG SL tablet Place 1 tablet (0.4 mg total) under the tongue every 5 (five) minutes x 3 doses as needed for chest pain. (Patient taking differently: Place 0.4 mg under the tongue every 5 (five) minutes x 3 doses as needed for chest pain (MAX 3 TABLETS). ) 30 tablet 12  . omeprazole (PRILOSEC) 40 MG capsule TAKE ONE CAPSULE EACH DAY 90 capsule 3  . oxyCODONE-acetaminophen (PERCOCET/ROXICET) 5-325 MG per  tablet Take 1 tablet by mouth every 6 (six) hours as needed for severe pain. 100 tablet 0  . predniSONE (DELTASONE) 1 MG tablet Take 3 mg by mouth daily with breakfast.     . Propylene Glycol (SYSTANE BALANCE OP) Apply 1 drop to eye daily as needed (dry eyeS).     [name] ZETIA 10 MG tablet TAKE ONE-HALF TABLET DAILY 45 tablet 6  . zolpidem (AMBIEN) 10 MG tablet TAKE 1 TABLET AT BEDTIME AS NEEDED FOR SLEEP 90 tablet 0   No current facility-administered medications on file prior to visit.    Review of Systems  Constitutional: Negative for unusual diaphoresis or night sweats HENT: Negative for ringing in ear or discharge Eyes: Negative for double vision or worsening visual disturbance.  Respiratory: Negative for choking and stridor.   Gastrointestinal: Negative for vomiting or other signifcant bowel change Genitourinary: Negative for hematuria or change in urine volume.  Musculoskeletal: Negative for other MSK pain or swelling Skin: Negative for color change and worsening wound.  Neurological: Negative for tremors and numbness other than noted  Psychiatric/Behavioral: Negative for decreased concentration or agitation other than above       Objective:   Physical Exam BP 112/72 mmHg  Pulse 80  Temp(Src) 97.8 F (36.6 C) (Oral)  Ht 6' (1.829 m)  Wt 188 lb (85.276 kg)  BMI 25.49 kg/m2  SpO2 96% VS noted,  Constitutional: Pt appears in no significant distress HENT: Head: NCAT.  Right Ear: External ear normal.  Left Ear: External ear normal.  Bilat tm's with mild erythema.  Max sinus areas non tender.  Pharynx with mild erythema, no exudate Eyes: . Pupils are equal, round, and reactive to light. Conjunctivae and EOM are normal Neck: Normal range of motion. Neck supple.  Cardiovascular: Normal rate and regular rhythm.   Pulmonary/Chest: Effort normal and breath sounds without rales or wheezing.  Neurological: Pt is alert. Not confused , motor grossly intact Skin: Skin is warm. No rash, no  LE edema Psychiatric: Pt behavior is normal. No agitation.     Assessment & Plan:

## 2015-10-17 NOTE — Assessment & Plan Note (Signed)
With seasonal flare it seems similar to many episodes in the past, just not getting that much better with nettie pott this time, zyrtec too strong; currently on chronic low dose steroid as well; for today for depomedrol IM, avoid further oral steroid if possible with add singulair 10 qd, allegra otc daily, and nasacort aq otc as well, likely to only need intensification for the next month , then taper back to only necessary meds

## 2015-10-22 ENCOUNTER — Ambulatory Visit (HOSPITAL_COMMUNITY)
Admission: RE | Admit: 2015-10-22 | Discharge: 2015-10-22 | Disposition: A | Discharge status: Home / Self Care | Payer: Medicare Other | Source: Ambulatory Visit | Attending: Nurse Practitioner | Admitting: Nurse Practitioner

## 2015-10-22 ENCOUNTER — Encounter (HOSPITAL_COMMUNITY): Payer: Self-pay | Admitting: Nurse Practitioner

## 2015-10-22 VITALS — BP 128/80 | HR 74 | Ht 72.0 in | Wt 187.8 lb

## 2015-10-22 DIAGNOSIS — G4733 Obstructive sleep apnea (adult) (pediatric): Secondary | ICD-10-CM | POA: Diagnosis not present

## 2015-10-22 DIAGNOSIS — I480 Paroxysmal atrial fibrillation: Secondary | ICD-10-CM | POA: Insufficient documentation

## 2015-10-22 DIAGNOSIS — I10 Essential (primary) hypertension: Secondary | ICD-10-CM | POA: Insufficient documentation

## 2015-10-22 DIAGNOSIS — I2510 Atherosclerotic heart disease of native coronary artery without angina pectoris: Secondary | ICD-10-CM | POA: Diagnosis not present

## 2015-10-22 DIAGNOSIS — R112 Nausea with vomiting, unspecified: Secondary | ICD-10-CM | POA: Insufficient documentation

## 2015-10-22 NOTE — Addendum Note (Signed)
Encounter addended by: Sherran Needs, NP on: 10/22/2015  9:32 AM<BR>     Documentation filed: Follow-up Section, LOS Section

## 2015-10-22 NOTE — Progress Notes (Signed)
Patient ID: [name], male   DOB: 03-Jun-1933, 81 y.o.   MRN: [id number]     Primary Care Physician: Evan Kehr, MD Referring Physician: Dr. Farrel Todd is a 81 y.o. male with a h/o CAD, STEMI,  HTN, Rheumatoid arthritis, newly diagnosed sleep apnea, that is in the afib clinic for f/u from Dr. Rayann Todd and new onset afib as of April of this year.  He was sen in the ER and converted. On DOAC for chadsvasc of at least 4.  So far pt has not had any further episodes. . Has reduced alcohol and caffeine. Is using CPAP religiously and has f/u with Dr. Radford Todd early January. Low risk myoview 1/16. He is active and walks daily and plays golf couple times a week, also goes to gym several days a week. He has recently has had episodes that start with mild nausea and sweating. He is currently wearing a monitor x 30 days. He has been wearing x 2 weeks but without any events or episodes. During these spells, he did not notice any irregularity in heart beat. He did not check his BP. No dizziness with spells.  Today, he denies symptoms of palpitations, chest pain, shortness of breath, orthopnea, PND, lower extremity edema, dizziness, presyncope, syncope, or neurologic sequela. The patient is tolerating medications without difficulties and is otherwise without complaint today.   Past Medical History  Diagnosis Date  . Colon polyp   . Hyperlipidemia   . Osteoarthritis   . Migraine with aura   . RA (rheumatoid arthritis) ([place])   . Premature atrial contractions   . GERD (gastroesophageal reflux disease)   . Hypertension   . CAD (coronary artery disease) 12/2013    s/p inferior STEMI 01/08/2014; LHC 01/08/14: total RCA occlusion s/p 3.5x56mm Xience DES distal RCA and 3.5x28 mm DES mid RCA, 60-70% mid LAD stenosis, EF 55%  . Palpitations   . STEMI (ST elevation myocardial infarction) ([place])   . Hx of echocardiogram     ETT- Myoview (1/16):  diaph atten, EF 63%, Low Risk  . Paroxysmal atrial  fibrillation ([place]) 4/16    chads2vasc score is at least 4  . OSA (obstructive sleep apnea) 06/19/2015    Moderate OSA with AHI 18/hr  . Sinusitis, chronic 10/17/2015   Past Surgical History  Procedure Laterality Date  . Back surgery  2004  . Left heart catheterization with coronary angiogram N/A 01/08/2014    Procedure: LEFT HEART CATHETERIZATION WITH CORONARY ANGIOGRAM;  Surgeon: Evan Sine, MD;  Location: [facility] CATH LAB;  Service: Cardiovascular;  Laterality: N/A;  . Percutaneous coronary stent intervention (pci-s)  01/08/2014    Procedure: PERCUTANEOUS CORONARY STENT INTERVENTION (PCI-S);  Surgeon: Evan Sine, MD;  Location: [facility] CATH LAB;  Service: Cardiovascular;;    Current Outpatient Prescriptions  Medication Sig Dispense Refill  . Abatacept (ORENCIA [place]) Inject 1 Applicatorful into the skin See admin instructions. Every 4 weeks    . aspirin 81 MG tablet Take 81 mg by mouth daily.    . Cholecalciferol (VITAMIN D) 1000 UNITS capsule Take 1,000 Units by mouth daily.     . CRESTOR 10 MG tablet TAKE ONE TABLET EACH DAY 30 tablet 10  . Cyanocobalamin 2500 MCG SUBL Place 1 tablet under the tongue daily.     . DULoxetine (CYMBALTA) 60 MG capsule TAKE ONE CAPSULE EACH DAY 30 capsule 3  . ELIQUIS 5 MG TABS tablet TAKE ONE TABLET TWICE DAILY 60 tablet 6  .  lisinopril (PRINIVIL,ZESTRIL) 5 MG tablet TAKE ONE TABLET BY MOUTH ONCE DAILY 90 tablet 1  . LORazepam (ATIVAN) 0.5 MG tablet TAKE 1 TO 2 TABLETS TWICE A DAY AS NEEDED FOR ANXIETY (Patient taking differently: TAKE 1 TO 2 TABLETS BY MOUTH TWICE A DAY AS NEEDED FOR ANXIETY) 60 tablet 5  . metoprolol tartrate (LOPRESSOR) 25 MG tablet Take 1.5 tablets (37.5 mg total) by mouth 2 (two) times daily. 270 tablet 3  . montelukast (SINGULAIR) 10 MG tablet Take 1 tablet (10 mg total) by mouth daily. 30 tablet 11  . nitroGLYCERIN (NITROSTAT) 0.4 MG SL tablet Place 1 tablet (0.4 mg total) under the tongue every 5 (five) minutes x 3 doses as needed for  chest pain. (Patient taking differently: Place 0.4 mg under the tongue every 5 (five) minutes x 3 doses as needed for chest pain (MAX 3 TABLETS). ) 30 tablet 12  . omeprazole (PRILOSEC) 40 MG capsule TAKE ONE CAPSULE EACH DAY 90 capsule 3  . oxyCODONE-acetaminophen (PERCOCET/ROXICET) 5-325 MG per tablet Take 1 tablet by mouth every 6 (six) hours as needed for severe pain. 100 tablet 0  . predniSONE (DELTASONE) 1 MG tablet Take 3 mg by mouth daily with breakfast.     . Propylene Glycol (SYSTANE BALANCE OP) Apply 1 drop to eye daily as needed (dry eyeS).     [name] ZETIA 10 MG tablet TAKE ONE-HALF TABLET DAILY 45 tablet 6  . zolpidem (AMBIEN) 10 MG tablet TAKE 1 TABLET AT BEDTIME AS NEEDED FOR SLEEP 90 tablet 0   No current facility-administered medications for this encounter.    Allergies  Allergen Reactions  . Methotrexate Other (See Comments)    REACTION: tachycardia  . Atorvastatin Other (See Comments)    Muscle pain, leg cramps  . Pravastatin Sodium Other (See Comments)    REACTION: cramps, fatigue    Social History   Social History  . Marital Status: Married    Spouse Name: N/A  . Number of Children: 2  . Years of Education: N/A   Occupational History  . Retired    Social History Main Topics  . Smoking status: Former Research scientist (life sciences)  . Smokeless tobacco: Never Used     Comment: Quit 50 yrs ago (as of 2015)  . Alcohol Use: 4.2 - 8.4 oz/week    7-14 Glasses of wine per week     Comment: 1-2 glasses of wine nightly  . Drug Use: No  . Sexual Activity: Yes   Other Topics Concern  . Not on file   Social History Narrative   1 Caffeine drink daily     Family History  Problem Relation Age of Onset  . Colon cancer Neg Hx   . Hypertension Mother   . Heart disease Father     ROS- All systems are reviewed and negative except as per the HPI above  Physical Exam: Filed Vitals:   10/22/15 0845  BP: 128/80  Pulse: 74  Height: 6' (1.829 m)  Weight: 187 lb 12.8 oz (85.186 kg)     GEN- The patient is well appearing, alert and oriented x 3 today.   Head- normocephalic, atraumatic Eyes-  Sclera clear, conjunctiva pink Ears- hearing intact Oropharynx- clear Neck- supple, no JVP Lymph- no cervical lymphadenopathy Lungs- Clear to ausculation bilaterally, normal work of breathing Heart- Regular rate and rhythm, no murmurs, rubs or gallops, PMI not laterally displaced GI- soft, NT, ND, + BS Extremities- no clubbing, cyanosis, or edema MS- no significant deformity or atrophy  Skin- no rash or lesion Psych- euthymic mood, full affect Neuro- strength and sensation are intact  EKG- NSR, IRBBB, inferior infarct, age undetermined, Pr int 174 ms, QRS 102 ms, QTc 455 ms. Epic records reviewed.   Assessment and Plan:  1. PAF Quiet, maintaining SR Reviewed triggers for afib and discussed lifestyle modification Continue metoprolol and can take an extra 1/2 to 1 tab of metoprolol for breakthrough afib. Continue eliquis 5 mg bid  2. HTN Stable  3. CAD  Stable   4. OSA Using cpap  5. Spells of nausea/sweating Wearing event monitor   F/u with Dr. Rayann Todd in February Afib clinic as needed  Geroge Baseman. [place], [facility] [address] [place], [place] [address] [phone]

## 2015-10-28 ENCOUNTER — Telehealth: Payer: Self-pay | Admitting: *Deleted

## 2015-10-28 NOTE — Telephone Encounter (Signed)
Discussed with patient that we have received a fax 10/28/15 10:06AM from [facility], stating they had not received transmissions from his cardiac event monitor for more than 2 days.  Patient states he spoke with [employer] today after 10:00 AM.  They asked him to push his event button and said everything was fine.

## 2015-11-11 DIAGNOSIS — M0609 Rheumatoid arthritis without rheumatoid factor, multiple sites: Secondary | ICD-10-CM | POA: Diagnosis not present

## 2015-11-12 ENCOUNTER — Other Ambulatory Visit: Payer: Self-pay | Admitting: Internal Medicine

## 2015-11-17 ENCOUNTER — Other Ambulatory Visit: Payer: Self-pay | Admitting: Internal Medicine

## 2015-11-18 NOTE — Telephone Encounter (Signed)
Pt has a HA Needs OV w/me this week pls Thx

## 2015-11-19 NOTE — Telephone Encounter (Signed)
Rf phoned in. I called pt to schedule OV.

## 2015-11-20 ENCOUNTER — Ambulatory Visit (INDEPENDENT_AMBULATORY_CARE_PROVIDER_SITE_OTHER): Payer: Medicare Other | Admitting: Internal Medicine

## 2015-11-20 ENCOUNTER — Encounter: Payer: Self-pay | Admitting: Internal Medicine

## 2015-11-20 VITALS — BP 120/80 | HR 65 | Temp 97.9°F | Wt 187.0 lb

## 2015-11-20 DIAGNOSIS — J320 Chronic maxillary sinusitis: Secondary | ICD-10-CM

## 2015-11-20 DIAGNOSIS — I2510 Atherosclerotic heart disease of native coronary artery without angina pectoris: Secondary | ICD-10-CM

## 2015-11-20 DIAGNOSIS — M069 Rheumatoid arthritis, unspecified: Secondary | ICD-10-CM

## 2015-11-20 MED ORDER — LEVOFLOXACIN 500 MG PO TABS: 500.0000 mg | ORAL_TABLET | Freq: Every day | ORAL | Status: DC

## 2015-11-20 NOTE — Assessment & Plan Note (Signed)
on [place]

## 2015-11-20 NOTE — Progress Notes (Signed)
Pre visit review using our clinic review tool, if applicable. No additional management support is needed unless otherwise documented below in the visit note. 

## 2015-11-20 NOTE — Assessment & Plan Note (Signed)
Chronic  Crestor, ASA, Toprol

## 2015-11-20 NOTE — Patient Instructions (Signed)
Use Afrin x 4-5days

## 2015-11-20 NOTE — Progress Notes (Signed)
Subjective:  Patient ID: [name], male    DOB: [birth year]  Age: 82 y.o. MRN: [id number]  CC: No chief complaint on file.   HPI [name] presents for sinus HA on R, sinus congestion and dry cough x 2-3 weeks - worse. F/u RA, CAD  Outpatient Prescriptions Prior to Visit  Medication Sig Dispense Refill  . Abatacept (ORENCIA [place]) Inject 1 Applicatorful into the skin See admin instructions. Every 4 weeks    . aspirin 81 MG tablet Take 81 mg by mouth daily.    . Cholecalciferol (VITAMIN D) 1000 UNITS capsule Take 1,000 Units by mouth daily.     . CRESTOR 10 MG tablet TAKE ONE TABLET EACH DAY 30 tablet 10  . Cyanocobalamin 2500 MCG SUBL Place 1 tablet under the tongue daily.     . DULoxetine (CYMBALTA) 60 MG capsule TAKE ONE CAPSULE EACH DAY 30 capsule 3  . ELIQUIS 5 MG TABS tablet TAKE ONE TABLET TWICE DAILY 60 tablet 11  . lisinopril (PRINIVIL,ZESTRIL) 5 MG tablet TAKE ONE TABLET BY MOUTH ONCE DAILY 90 tablet 1  . LORazepam (ATIVAN) 0.5 MG tablet TAKE 1 TO 2 TABLETS TWICE A DAY AS NEEDED FOR ANXIETY 60 tablet 5  . metoprolol tartrate (LOPRESSOR) 25 MG tablet Take 1.5 tablets (37.5 mg total) by mouth 2 (two) times daily. 270 tablet 3  . montelukast (SINGULAIR) 10 MG tablet Take 1 tablet (10 mg total) by mouth daily. 30 tablet 11  . nitroGLYCERIN (NITROSTAT) 0.4 MG SL tablet Place 1 tablet (0.4 mg total) under the tongue every 5 (five) minutes x 3 doses as needed for chest pain. (Patient taking differently: Place 0.4 mg under the tongue every 5 (five) minutes x 3 doses as needed for chest pain (MAX 3 TABLETS). ) 30 tablet 12  . omeprazole (PRILOSEC) 40 MG capsule TAKE ONE CAPSULE EACH DAY 90 capsule 3  . oxyCODONE-acetaminophen (PERCOCET/ROXICET) 5-325 MG per tablet Take 1 tablet by mouth every 6 (six) hours as needed for severe pain. 100 tablet 0  . predniSONE (DELTASONE) 1 MG tablet Take 3 mg by mouth daily with breakfast.     . Propylene Glycol (SYSTANE BALANCE OP) Apply 1 drop to eye  daily as needed (dry eyeS).     [name] ZETIA 10 MG tablet TAKE ONE-HALF TABLET DAILY 45 tablet 6  . zolpidem (AMBIEN) 10 MG tablet TAKE 1 TABLET AT BEDTIME AS NEEDED FOR SLEEP 90 tablet 0   No facility-administered medications prior to visit.    ROS Review of Systems  Constitutional: Negative for appetite change, fatigue and unexpected weight change.  HENT: Positive for congestion, postnasal drip and sinus pressure. Negative for nosebleeds, sneezing, sore throat and trouble swallowing.   Eyes: Negative for itching and visual disturbance.  Respiratory: Positive for cough.   Cardiovascular: Negative for chest pain, palpitations and leg swelling.  Gastrointestinal: Negative for nausea, diarrhea, blood in stool and abdominal distention.  Genitourinary: Negative for frequency and hematuria.  Musculoskeletal: Positive for arthralgias. Negative for back pain, joint swelling, gait problem and neck pain.  Skin: Negative for rash.  Neurological: Negative for dizziness, tremors, speech difficulty, weakness and light-headedness.  Psychiatric/Behavioral: Negative for sleep disturbance, dysphoric mood and agitation. The patient is not nervous/anxious.     Objective:  BP 120/80 mmHg  Pulse 65  Temp(Src) 97.9 F (36.6 C) (Oral)  Wt 187 lb (84.823 kg)  SpO2 98%  BP Readings from Last 3 Encounters:  11/20/15 120/80  10/22/15 128/80  10/17/15 112/72    Wt Readings from Last 3 Encounters:  11/20/15 187 lb (84.823 kg)  10/22/15 187 lb 12.8 oz (85.186 kg)  10/17/15 188 lb (85.276 kg)    Physical Exam  Constitutional: He is oriented to person, place, and time. He appears well-developed. No distress.  NAD  HENT:  Mouth/Throat: Oropharynx is clear and moist.  Eyes: Conjunctivae are normal. Pupils are equal, round, and reactive to light.  Neck: Normal range of motion. No JVD present. No thyromegaly present.  Cardiovascular: Normal rate, regular rhythm, normal heart sounds and intact distal pulses.   Exam reveals no gallop and no friction rub.   No murmur heard. Pulmonary/Chest: Effort normal and breath sounds normal. No respiratory distress. He has no wheezes. He has no rales. He exhibits no tenderness.  Abdominal: Soft. Bowel sounds are normal. He exhibits no distension and no mass. There is no tenderness. There is no rebound and no guarding.  Musculoskeletal: Normal range of motion. He exhibits no edema or tenderness.  Lymphadenopathy:    He has no cervical adenopathy.  Neurological: He is alert and oriented to person, place, and time. He has normal reflexes. No cranial nerve deficit. He exhibits normal muscle tone. He displays a negative Romberg sign. Coordination and gait normal.  Skin: Skin is warm and dry. No rash noted.  Psychiatric: He has a normal mood and affect. His behavior is normal. Judgment and thought content normal.    Lab Results  Component Value Date   WBC 5.6 10/07/2015   HGB 13.1 10/07/2015   HCT 38.1* 10/07/2015   PLT 275 10/07/2015   GLUCOSE 95 10/07/2015   CHOL 145 12/10/2014   TRIG 168.0* 12/10/2014   HDL 53.10 12/10/2014   LDLDIRECT 168.9 05/24/2013   LDLCALC 58 12/10/2014   ALT 17 10/07/2015   AST 18 10/07/2015   NA 139 10/07/2015   K 4.0 10/07/2015   CL 104 10/07/2015   CREATININE 0.87 10/07/2015   BUN 15 10/07/2015   CO2 28 10/07/2015   TSH 1.015 10/07/2015   PSA 1.52 06/04/2011   INR 0.98 02/20/2015   HGBA1C 6.2* 01/08/2014    No results found.  Assessment & Plan:   Diagnoses and all orders for this visit:  Chronic maxillary sinusitis  Other orders -     levofloxacin (LEVAQUIN) 500 MG tablet; Take 1 tablet (500 mg total) by mouth daily.  I am having [name] start on levofloxacin. I am also having him maintain his Cyanocobalamin, Vitamin D, aspirin, Abatacept (ORENCIA [place]), nitroGLYCERIN, Propylene Glycol (SYSTANE BALANCE OP), oxyCODONE-acetaminophen, predniSONE, metoprolol tartrate, CRESTOR, lisinopril, zolpidem, DULoxetine,  omeprazole, ZETIA, montelukast, ELIQUIS, and LORazepam.  Meds ordered this encounter  Medications  . levofloxacin (LEVAQUIN) 500 MG tablet    Sig: Take 1 tablet (500 mg total) by mouth daily.    Dispense:  10 tablet    Refill:  0     Follow-up: Return in about 3 months (around 02/18/2016) for a follow-up visit.  [name], MD

## 2015-11-20 NOTE — Assessment & Plan Note (Addendum)
Exacerbation 1/17 Levaquin qd Afrin

## 2015-11-21 HISTORY — PX: CARDIOVASCULAR STRESS TEST: SHX262

## 2015-11-24 ENCOUNTER — Ambulatory Visit: Payer: Medicare Other | Admitting: Cardiology

## 2015-11-25 ENCOUNTER — Encounter: Payer: Self-pay | Admitting: Cardiology

## 2015-11-25 ENCOUNTER — Ambulatory Visit (INDEPENDENT_AMBULATORY_CARE_PROVIDER_SITE_OTHER): Payer: Medicare Other | Admitting: Cardiology

## 2015-11-25 VITALS — BP 122/76 | HR 52 | Ht 72.0 in | Wt 186.4 lb

## 2015-11-25 DIAGNOSIS — G4733 Obstructive sleep apnea (adult) (pediatric): Secondary | ICD-10-CM

## 2015-11-25 DIAGNOSIS — I2510 Atherosclerotic heart disease of native coronary artery without angina pectoris: Secondary | ICD-10-CM | POA: Diagnosis not present

## 2015-11-25 DIAGNOSIS — I10 Essential (primary) hypertension: Secondary | ICD-10-CM | POA: Diagnosis not present

## 2015-11-25 DIAGNOSIS — I480 Paroxysmal atrial fibrillation: Secondary | ICD-10-CM

## 2015-11-25 NOTE — Patient Instructions (Signed)

## 2015-11-25 NOTE — Progress Notes (Signed)
Cardiology Office Note   Date:  11/25/2015   ID:  [name], [name] 07/25/1933, MRN [id number]  PCP:  [name], MD    No chief complaint on file.     History of Present Illness: [name] is an 82 y.o. male who presents for evaluation of OSA.  He has a history of atrial fibrillation and was referred by Dr. Rayann Heman for sleep study.  He was found to have moderate OSA with an AHI of 18/hr.  Oxygen desaturations were noted with respiratory events as low as 88% with soft snoring.  He underwent CPAP titration to 8cm H2O.  He presents back today for followup.  He is doing well with his device.  He tolerates the nasal mask and feels the pressure is adequate.  Since going on CPAP he sleeps much better and feels rested in the am with less daytime sleepiness.  He has dry mouth occasionally but no nasal dryness.  He does not use a chin strap.   Past Medical History  Diagnosis Date  . Colon polyp   . Hyperlipidemia   . Osteoarthritis   . Migraine with aura   . RA (rheumatoid arthritis) ([place])   . Premature atrial contractions   . GERD (gastroesophageal reflux disease)   . Hypertension   . CAD (coronary artery disease) 12/2013    s/p inferior STEMI 01/08/2014; LHC 01/08/14: total RCA occlusion s/p 3.5x101mm Xience DES distal RCA and 3.5x28 mm DES mid RCA, 60-70% mid LAD stenosis, EF 55%  . Palpitations   . STEMI (ST elevation myocardial infarction) ([place])   . Hx of echocardiogram     ETT- Myoview (1/16):  diaph atten, EF 63%, Low Risk  . Paroxysmal atrial fibrillation ([place]) 4/16    chads2vasc score is at least 4  . OSA (obstructive sleep apnea) 06/19/2015    Moderate OSA with AHI 18/hr  . Sinusitis, chronic 10/17/2015    Past Surgical History  Procedure Laterality Date  . Back surgery  2004  . Left heart catheterization with coronary angiogram N/A 01/08/2014    Procedure: LEFT HEART CATHETERIZATION WITH CORONARY ANGIOGRAM;  Surgeon: Troy Sine, MD;  Location: [facility]  CATH LAB;  Service: Cardiovascular;  Laterality: N/A;  . Percutaneous coronary stent intervention (pci-s)  01/08/2014    Procedure: PERCUTANEOUS CORONARY STENT INTERVENTION (PCI-S);  Surgeon: Troy Sine, MD;  Location: [facility] CATH LAB;  Service: Cardiovascular;;     Current Outpatient Prescriptions  Medication Sig Dispense Refill  . Abatacept (ORENCIA [place]) Inject 1 Applicatorful into the skin See admin instructions. Every 4 weeks    . apixaban (ELIQUIS) 5 MG TABS tablet Take 5 mg by mouth 2 (two) times daily.    [name] aspirin 81 MG tablet Take 81 mg by mouth daily.    . Cholecalciferol (VITAMIN D) 1000 UNITS capsule Take 1,000 Units by mouth daily.     . Cyanocobalamin 2500 MCG SUBL Place 1 tablet under the tongue daily.     . DULoxetine (CYMBALTA) 60 MG capsule Take 60 mg by mouth daily.    [name] levofloxacin (LEVAQUIN) 500 MG tablet Take 1 tablet (500 mg total) by mouth daily. 10 tablet 0  . lisinopril (PRINIVIL,ZESTRIL) 5 MG tablet TAKE ONE TABLET BY MOUTH ONCE DAILY 90 tablet 1  . LORazepam (ATIVAN) 0.5 MG tablet Take 0.5 mg by mouth every 8 (eight) hours. Take 1-2 tablets by mouth daily  as needed for anxiety.    . montelukast (SINGULAIR) 10 MG tablet Take 1 tablet (10 mg total) by mouth daily. 30 tablet 11  . nitroGLYCERIN (NITROSTAT) 0.4 MG SL tablet Place 0.4 mg under the tongue every 5 (five) minutes as needed for chest pain (max 3 pills a day).    [name] oxyCODONE-acetaminophen (PERCOCET/ROXICET) 5-325 MG per tablet Take 1 tablet by mouth every 6 (six) hours as needed for severe pain. 100 tablet 0  . predniSONE (DELTASONE) 1 MG tablet Take 3 mg by mouth daily with breakfast.     . Propylene Glycol (SYSTANE BALANCE OP) Apply 1 drop to eye daily as needed (dry eyeS).     [name] rosuvastatin (CRESTOR) 10 MG tablet Take 10 mg by mouth daily.    [name] ZETIA 10 MG tablet TAKE ONE-HALF TABLET DAILY 45 tablet 6  . zolpidem (AMBIEN) 10 MG tablet TAKE 1 TABLET AT BEDTIME AS NEEDED FOR SLEEP 90 tablet 0   No current  facility-administered medications for this visit.    Allergies:   Methotrexate; Atorvastatin; and Pravastatin sodium    Social History:  The patient  reports that he has quit smoking. He has never used smokeless tobacco. He reports that he drinks about 4.2 - 8.4 oz of alcohol per week. He reports that he does not use illicit drugs.   Family History:  The patient's family history includes Heart disease in his father; Hypertension in his mother. There is no history of Colon cancer.    ROS:  Please see the history of present illness.   Otherwise, review of systems are positive for none.   All other systems are reviewed and negative.    PHYSICAL EXAM: VS:  BP 122/76 mmHg  Pulse 52  Ht 6' (1.829 m)  Wt 186 lb 6.4 oz (84.55 kg)  BMI 25.27 kg/m2  SpO2 97% , BMI Body mass index is 25.27 kg/(m^2). GEN: Well nourished, well developed, in no acute distress HEENT: normal Neck: no JVD, carotid bruits, or masses Cardiac: RRR; no murmurs, rubs, or gallops,no edema  Respiratory:  clear to auscultation bilaterally, normal work of breathing GI: soft, nontender, nondistended, + BS MS: no deformity or atrophy Skin: warm and dry, no rash Neuro:  Strength and sensation are intact Psych: euthymic mood, full affect   EKG:  EKG is not ordered today.    Recent Labs: 10/07/2015: ALT 17; BUN 15; Creat 0.87; Hemoglobin 13.1; Platelets 275; Potassium 4.0; Sodium 139; TSH 1.015    Lipid Panel    Component Value Date/Time   CHOL 145 12/10/2014 0804   TRIG 168.0* 12/10/2014 0804   HDL 53.10 12/10/2014 0804   CHOLHDL 3 12/10/2014 0804   VLDL 33.6 12/10/2014 0804   LDLCALC 58 12/10/2014 0804   LDLDIRECT 168.9 05/24/2013 0732      Wt Readings from Last 3 Encounters:  11/25/15 186 lb 6.4 oz (84.55 kg)  11/20/15 187 lb (84.823 kg)  10/22/15 187 lb 12.8 oz (85.186 kg)       ASSESSMENT AND PLAN:  1.  Moderate OSA with an AHI of 18/hr and now on CPAP at 8cm H2O.  His d/l showed an AHI of  4.3/hr on 8cm H2O and 77% compliance in using more than 4 hours nightly.  Patient has been using and benefiting from CPAP use and will continue to benefit from therapy. Pt had successful PAP titration. I have also instructed the patient on proper sleep hygiene, avoidance of sleeping in the supine position and avoidance  of alcohol within 4 hours of bedtime.  The patient was also instructed to avoid driving if sleepy.  I will add a chin strap to help with mouth dryness due to mouth breathing.   2.  HTN - controlled on current medical regimen 3.  PAF- maintaining NSR    Current medicines are reviewed at length with the patient today.  The patient does not have concerns regarding medicines.  The following changes have been made:  no change  Labs/ tests ordered today: See above Assessment and Plan No orders of the defined types were placed in this encounter.     Disposition:   FU with me in 6 months  Signed, Sueanne Margarita, MD  11/25/2015 10:40 AM    [facility] [place], [place], [place]  [address] Phone: [phone]; Fax: [phone]

## 2015-11-28 DIAGNOSIS — L720 Epidermal cyst: Secondary | ICD-10-CM | POA: Diagnosis not present

## 2015-11-28 DIAGNOSIS — Z85828 Personal history of other malignant neoplasm of skin: Secondary | ICD-10-CM | POA: Diagnosis not present

## 2015-11-28 DIAGNOSIS — D1801 Hemangioma of skin and subcutaneous tissue: Secondary | ICD-10-CM | POA: Diagnosis not present

## 2015-11-28 DIAGNOSIS — L821 Other seborrheic keratosis: Secondary | ICD-10-CM | POA: Diagnosis not present

## 2015-11-28 DIAGNOSIS — L812 Freckles: Secondary | ICD-10-CM | POA: Diagnosis not present

## 2015-11-28 DIAGNOSIS — L570 Actinic keratosis: Secondary | ICD-10-CM | POA: Diagnosis not present

## 2015-12-08 ENCOUNTER — Other Ambulatory Visit: Payer: Self-pay | Admitting: Internal Medicine

## 2015-12-09 DIAGNOSIS — M0609 Rheumatoid arthritis without rheumatoid factor, multiple sites: Secondary | ICD-10-CM | POA: Diagnosis not present

## 2015-12-10 ENCOUNTER — Other Ambulatory Visit: Payer: Self-pay | Admitting: Internal Medicine

## 2015-12-10 ENCOUNTER — Encounter: Payer: Self-pay | Admitting: Internal Medicine

## 2015-12-10 ENCOUNTER — Ambulatory Visit (INDEPENDENT_AMBULATORY_CARE_PROVIDER_SITE_OTHER): Payer: Medicare Other | Admitting: Internal Medicine

## 2015-12-10 VITALS — BP 124/68 | HR 58 | Ht 72.0 in | Wt 186.2 lb

## 2015-12-10 DIAGNOSIS — E782 Mixed hyperlipidemia: Secondary | ICD-10-CM

## 2015-12-10 DIAGNOSIS — I480 Paroxysmal atrial fibrillation: Secondary | ICD-10-CM | POA: Diagnosis not present

## 2015-12-10 DIAGNOSIS — I10 Essential (primary) hypertension: Secondary | ICD-10-CM | POA: Diagnosis not present

## 2015-12-10 DIAGNOSIS — I25119 Atherosclerotic heart disease of native coronary artery with unspecified angina pectoris: Secondary | ICD-10-CM | POA: Diagnosis not present

## 2015-12-10 NOTE — Addendum Note (Signed)
Addended by: Janan Halter F on: 12/10/2015 02:50 PM   Modules accepted: Orders

## 2015-12-10 NOTE — Progress Notes (Signed)
Electrophysiology Office Note   Date:  12/10/2015   ID:  Brixton, Kirley 1933/05/30, MRN [id number]  PCP:  [name], MD  Primary Electrophysiologist: Thompson Grayer, MD    Chief Complaint  Patient presents with  . Atrial Fibrillation     History of Present Illness: [name] is a 82 y.o. male who presents today for electrophysiology evaluation.   Doing very well.  AFib is well controlled.  His primary concern is with rheumatoid arthritis and fatigue.  He has been diagnosed with sleep apnea and is now using CPAP.  Today, he denies symptoms of palpitations, chest pain, shortness of breath, orthopnea, PND, lower extremity edema, claudication, dizziness, presyncope, syncope, bleeding, or neurologic sequela. The patient is tolerating medications without difficulties and is otherwise without complaint today.    Past Medical History  Diagnosis Date  . Colon polyp   . Hyperlipidemia   . Osteoarthritis   . Migraine with aura   . RA (rheumatoid arthritis) ([place])   . Premature atrial contractions   . GERD (gastroesophageal reflux disease)   . Hypertension   . CAD (coronary artery disease) 12/2013    s/p inferior STEMI 01/08/2014; LHC 01/08/14: total RCA occlusion s/p 3.5x28mm Xience DES distal RCA and 3.5x28 mm DES mid RCA, 60-70% mid LAD stenosis, EF 55%  . Palpitations   . STEMI (ST elevation myocardial infarction) ([place])   . Hx of echocardiogram     ETT- Myoview (1/16):  diaph atten, EF 63%, Low Risk  . Paroxysmal atrial fibrillation ([place]) 4/16    chads2vasc score is at least 4  . OSA (obstructive sleep apnea) 06/19/2015    Moderate OSA with AHI 18/hr  . Sinusitis, chronic 10/17/2015   Past Surgical History  Procedure Laterality Date  . Back surgery  2004  . Left heart catheterization with coronary angiogram N/A 01/08/2014    Procedure: LEFT HEART CATHETERIZATION WITH CORONARY ANGIOGRAM;  Surgeon: Troy Sine, MD;  Location: [facility] CATH LAB;  Service: Cardiovascular;   Laterality: N/A;  . Percutaneous coronary stent intervention (pci-s)  01/08/2014    Procedure: PERCUTANEOUS CORONARY STENT INTERVENTION (PCI-S);  Surgeon: Troy Sine, MD;  Location: [facility] CATH LAB;  Service: Cardiovascular;;     Current Outpatient Prescriptions  Medication Sig Dispense Refill  . Abatacept (ORENCIA [place]) Inject 1 Applicatorful into the skin See admin instructions. Every 4 weeks    . apixaban (ELIQUIS) 5 MG TABS tablet Take 5 mg by mouth 2 (two) times daily.    [name] aspirin 81 MG tablet Take 81 mg by mouth daily.    . Cholecalciferol (VITAMIN D) 1000 UNITS capsule Take 1,000 Units by mouth daily.     . Cyanocobalamin 2500 MCG SUBL Place 1 tablet under the tongue daily.     . DULoxetine (CYMBALTA) 60 MG capsule Take 60 mg by mouth daily.    [name] ezetimibe (ZETIA) 10 MG tablet Take 5 mg by mouth daily. Take 1/2 tablet by mouth daily.    [name] levofloxacin (LEVAQUIN) 500 MG tablet Take 1 tablet (500 mg total) by mouth daily. 10 tablet 0  . lisinopril (PRINIVIL,ZESTRIL) 5 MG tablet TAKE ONE TABLET EACH DAY 90 tablet 1  . LORazepam (ATIVAN) 0.5 MG tablet Take 0.5 mg by mouth every 8 (eight) hours. Take 1-2 tablets by mouth daily as needed for anxiety.    . metoprolol tartrate (LOPRESSOR) 25 MG tablet Take 37.5 mg by mouth 2 (two) times daily. Take 1 and a 1/2 tablets by mouth  daily.    . nitroGLYCERIN (NITROSTAT) 0.4 MG SL tablet Place 0.4 mg under the tongue every 5 (five) minutes as needed for chest pain (max 3 pills a day).    [name] omeprazole (PRILOSEC) 40 MG capsule Take 40 mg by mouth daily.    [name] oxyCODONE-acetaminophen (PERCOCET/ROXICET) 5-325 MG per tablet Take 1 tablet by mouth every 6 (six) hours as needed for severe pain. 100 tablet 0  . predniSONE (DELTASONE) 1 MG tablet Take 3 mg by mouth daily with breakfast.     . Propylene Glycol (SYSTANE BALANCE OP) Apply 1 drop to eye daily as needed (dry eyeS).     [name] rosuvastatin (CRESTOR) 10 MG tablet Take 10 mg by mouth daily.    [name] zolpidem  (AMBIEN) 10 MG tablet Take 10 mg by mouth at bedtime as needed for sleep.     No current facility-administered medications for this visit.    Allergies:   Methotrexate; Atorvastatin; and Pravastatin sodium   Social History:  The patient  reports that he has quit smoking. He has never used smokeless tobacco. He reports that he drinks about 4.2 - 8.4 oz of alcohol per week. He reports that he does not use illicit drugs.   Family History:  The patient's  family history includes Heart disease in his father; Hypertension in his mother. There is no history of Colon cancer.    ROS:  Please see the history of present illness.   All other systems are reviewed and negative.    PHYSICAL EXAM: VS:  BP 124/68 mmHg  Pulse 58  Ht 6' (1.829 m)  Wt 186 lb 3.2 oz (84.46 kg)  BMI 25.25 kg/m2 , BMI Body mass index is 25.25 kg/(m^2). GEN: Well nourished, well developed, in no acute distress HEENT: normal Neck: no JVD, carotid bruits, or masses Cardiac: RRR; no murmurs, rubs, or gallops,no edema  Respiratory:  clear to auscultation bilaterally, normal work of breathing GI: soft, nontender, nondistended, + BS MS: no deformity or atrophy Skin: warm and dry  Neuro:  Strength and sensation are intact Psych: euthymic mood, full affect  EKG:  EKG is ordered today. The ekg ordered today shows sinus bradycardia 58 bpm, otherwise normal ekg   Recent Labs: 10/07/2015: ALT 17; BUN 15; Creat 0.87; Hemoglobin 13.1; Platelets 275; Potassium 4.0; Sodium 139; TSH 1.015    Lipid Panel     Component Value Date/Time   CHOL 145 12/10/2014 0804   TRIG 168.0* 12/10/2014 0804   HDL 53.10 12/10/2014 0804   CHOLHDL 3 12/10/2014 0804   VLDL 33.6 12/10/2014 0804   LDLCALC 58 12/10/2014 0804   LDLDIRECT 168.9 05/24/2013 0732     Wt Readings from Last 3 Encounters:  12/10/15 186 lb 3.2 oz (84.46 kg)  11/25/15 186 lb 6.4 oz (84.55 kg)  11/20/15 187 lb (84.823 kg)    ASSESSMENT AND PLAN:   1. AF Stable,  without recurrent Continue eliquis chads2vasc score is at least 4 Would not initiate AAD therapy unless he has additional afib No changes today Bmet, cbc  Echo to evaluate for structural changes  2. OSA Stable No change required today  3. Cad Doing well  No ischemic symptoms.  5. htn Stable No change required today bmet   6. HL Stable Will check fasting lipids and forward to lipid clinic  To see Dr Radford Pax in 6 months I will therefore see in a year   Current medicines are reviewed at length with the patient today.  The patient does not have concerns regarding his medicines.  The following changes were made today:  none   Signed, Thompson Grayer, MD  12/10/2015 2:34 PM     [address] [place] [address] [phone] (office) [phone] (fax)

## 2015-12-10 NOTE — Patient Instructions (Signed)
Medication Instructions:  Your physician recommends that you continue on your current medications as directed. Please refer to the Current Medication list given to you today.   Labwork: Your physician recommends that you return for lab work when you come back for echo: fasting lipid/liver/cbc/bmp   Testing/Procedures: Your physician has requested that you have an echocardiogram. Echocardiography is a painless test that uses sound waves to create images of your heart. It provides your doctor with information about the size and shape of your heart and how well your heart's chambers and valves are working. This procedure takes approximately one hour. There are no restrictions for this procedure.    Follow-Up: Your physician wants you to follow-up in: 12 months with Dr Vallery Ridge will receive a reminder letter in the mail two months in advance. If you don't receive a letter, please call our office to schedule the follow-up appointment.   Any Other Special Instructions Will Be Listed Below (If Applicable).     If you need a refill on your cardiac medications before your next appointment, please call your pharmacy.

## 2015-12-10 NOTE — Telephone Encounter (Signed)
Receive call pt is requesting refill on his Oxycodone...[name]

## 2015-12-11 DIAGNOSIS — Z85828 Personal history of other malignant neoplasm of skin: Secondary | ICD-10-CM | POA: Diagnosis not present

## 2015-12-11 DIAGNOSIS — C44229 Squamous cell carcinoma of skin of left ear and external auricular canal: Secondary | ICD-10-CM | POA: Diagnosis not present

## 2015-12-15 ENCOUNTER — Other Ambulatory Visit: Payer: Self-pay | Admitting: Internal Medicine

## 2015-12-16 NOTE — Telephone Encounter (Signed)
Pt called to check up on this request and wants to speak to the assistant. He really need refill of this med, please give him call back today if possible

## 2015-12-19 NOTE — Telephone Encounter (Signed)
Rx upfront for p/u. Pt informed  

## 2015-12-22 DIAGNOSIS — C44229 Squamous cell carcinoma of skin of left ear and external auricular canal: Secondary | ICD-10-CM | POA: Diagnosis not present

## 2015-12-23 DIAGNOSIS — Z79899 Other long term (current) drug therapy: Secondary | ICD-10-CM | POA: Diagnosis not present

## 2015-12-23 DIAGNOSIS — M069 Rheumatoid arthritis, unspecified: Secondary | ICD-10-CM | POA: Diagnosis not present

## 2016-01-13 DIAGNOSIS — M0609 Rheumatoid arthritis without rheumatoid factor, multiple sites: Secondary | ICD-10-CM | POA: Diagnosis not present

## 2016-01-13 DIAGNOSIS — Z79899 Other long term (current) drug therapy: Secondary | ICD-10-CM | POA: Diagnosis not present

## 2016-01-19 DIAGNOSIS — M0609 Rheumatoid arthritis without rheumatoid factor, multiple sites: Secondary | ICD-10-CM | POA: Diagnosis not present

## 2016-02-15 IMAGING — CR DG CHEST 1V PORT
1 series · 1 of 1 positions shown · non-contrast
Comparison: 08/31/2011 prior chest radiographs dating back to
09/25/2009

CLINICAL DATA: 80-year-old male with chest pain. Post cardiac
catheterization for STEMI.

EXAM:
PORTABLE CHEST - 1 VIEW

[AP]
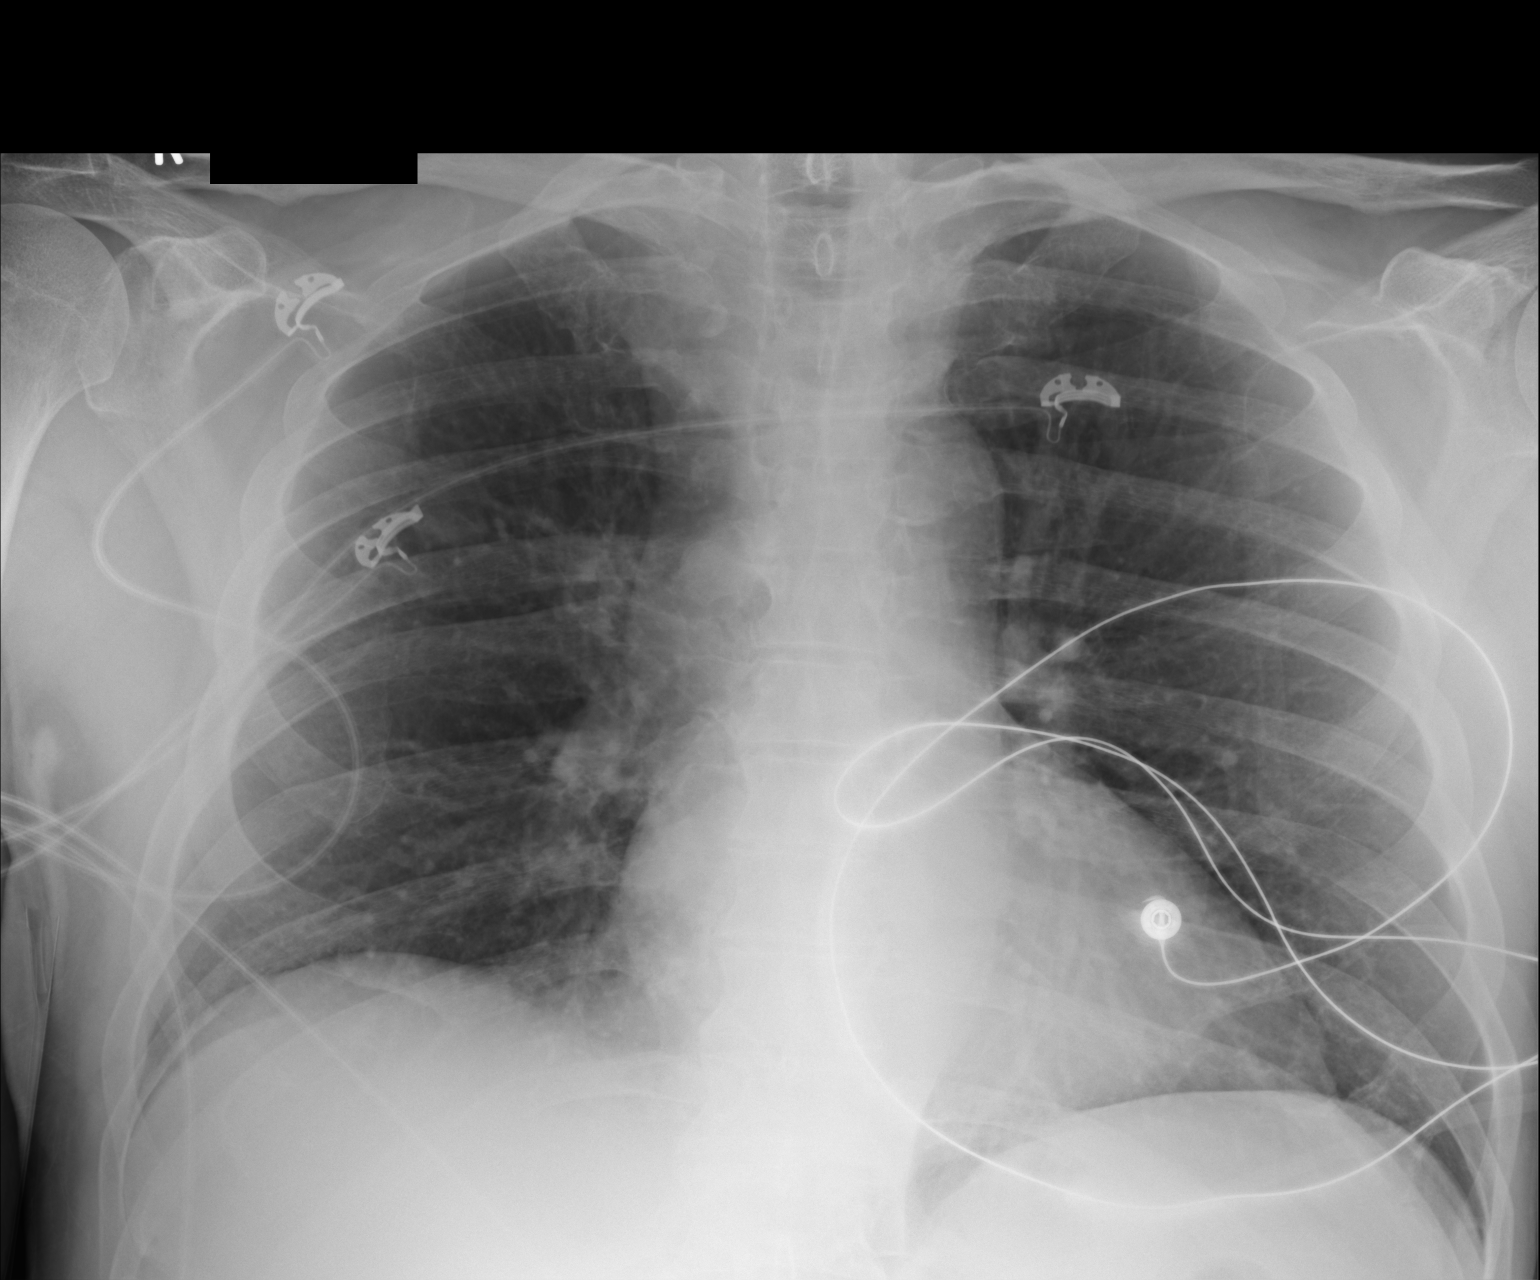

[1 of 1 positions shown; findings below may reference images not displayed]

FINDINGS: Mild cardiomegaly noted.

There is no evidence of focal airspace disease, pulmonary edema,
suspicious pulmonary nodule/mass, pleural effusion, or pneumothorax.
No acute bony abnormalities are identified.
IMPRESSION: Mild cardiomegaly without acute cardiopulmonary disease.

## 2016-02-16 ENCOUNTER — Other Ambulatory Visit: Payer: Self-pay | Admitting: Internal Medicine

## 2016-02-16 DIAGNOSIS — M0609 Rheumatoid arthritis without rheumatoid factor, multiple sites: Secondary | ICD-10-CM | POA: Diagnosis not present

## 2016-02-16 DIAGNOSIS — M5030 Other cervical disc degeneration, unspecified cervical region: Secondary | ICD-10-CM | POA: Diagnosis not present

## 2016-02-17 DIAGNOSIS — M0609 Rheumatoid arthritis without rheumatoid factor, multiple sites: Secondary | ICD-10-CM | POA: Diagnosis not present

## 2016-02-20 MED ORDER — ZOLPIDEM TARTRATE 10 MG PO TABS: 10.0000 mg | ORAL_TABLET | Freq: Every evening | ORAL | Status: DC | PRN

## 2016-02-20 NOTE — Addendum Note (Signed)
Addended by: Lyman Bishop on: 02/20/2016 10:46 AM   Modules accepted: Orders

## 2016-02-20 NOTE — Telephone Encounter (Signed)
Rx faxed to pharmacy  

## 2016-02-23 ENCOUNTER — Other Ambulatory Visit: Payer: Self-pay

## 2016-02-23 ENCOUNTER — Ambulatory Visit (HOSPITAL_COMMUNITY): Payer: Medicare Other | Attending: Cardiovascular Disease

## 2016-02-23 ENCOUNTER — Other Ambulatory Visit: Payer: Self-pay | Admitting: Internal Medicine

## 2016-02-23 ENCOUNTER — Other Ambulatory Visit (INDEPENDENT_AMBULATORY_CARE_PROVIDER_SITE_OTHER): Payer: Medicare Other | Admitting: *Deleted

## 2016-02-23 DIAGNOSIS — I25119 Atherosclerotic heart disease of native coronary artery with unspecified angina pectoris: Secondary | ICD-10-CM | POA: Diagnosis not present

## 2016-02-23 DIAGNOSIS — I209 Angina pectoris, unspecified: Secondary | ICD-10-CM | POA: Diagnosis not present

## 2016-02-23 DIAGNOSIS — I10 Essential (primary) hypertension: Secondary | ICD-10-CM | POA: Diagnosis not present

## 2016-02-23 DIAGNOSIS — G4733 Obstructive sleep apnea (adult) (pediatric): Secondary | ICD-10-CM | POA: Insufficient documentation

## 2016-02-23 DIAGNOSIS — Z87891 Personal history of nicotine dependence: Secondary | ICD-10-CM | POA: Diagnosis not present

## 2016-02-23 DIAGNOSIS — I480 Paroxysmal atrial fibrillation: Secondary | ICD-10-CM

## 2016-02-23 DIAGNOSIS — E782 Mixed hyperlipidemia: Secondary | ICD-10-CM

## 2016-02-23 DIAGNOSIS — E785 Hyperlipidemia, unspecified: Secondary | ICD-10-CM | POA: Diagnosis not present

## 2016-02-23 DIAGNOSIS — I252 Old myocardial infarction: Secondary | ICD-10-CM | POA: Insufficient documentation

## 2016-02-23 DIAGNOSIS — I119 Hypertensive heart disease without heart failure: Secondary | ICD-10-CM | POA: Diagnosis not present

## 2016-02-23 DIAGNOSIS — I358 Other nonrheumatic aortic valve disorders: Secondary | ICD-10-CM | POA: Insufficient documentation

## 2016-02-23 DIAGNOSIS — I2510 Atherosclerotic heart disease of native coronary artery without angina pectoris: Secondary | ICD-10-CM | POA: Diagnosis not present

## 2016-02-23 DIAGNOSIS — I4891 Unspecified atrial fibrillation: Secondary | ICD-10-CM | POA: Diagnosis not present

## 2016-02-23 HISTORY — PX: TRANSTHORACIC ECHOCARDIOGRAM: SHX275

## 2016-02-23 LAB — CBC WITH DIFFERENTIAL/PLATELET
Basophils Absolute: 0 cells/uL (ref 0–200)
Basophils Relative: 0 %
Eosinophils Absolute: 90 cells/uL (ref 15–500)
Eosinophils Relative: 2 %
HCT: 39.1 % (ref 38.5–50.0)
Hemoglobin: 13 g/dL — ABNORMAL LOW (ref 13.2–17.1)
Lymphocytes Relative: 44 %
Lymphs Abs: 1980 cells/uL (ref 850–3900)
MCH: 32.5 pg (ref 27.0–33.0)
MCHC: 33.2 g/dL (ref 32.0–36.0)
MCV: 97.8 fL (ref 80.0–100.0)
MPV: 8.8 fL (ref 7.5–12.5)
Monocytes Absolute: 315 cells/uL (ref 200–950)
Monocytes Relative: 7 %
Neutro Abs: 2115 cells/uL (ref 1500–7800)
Neutrophils Relative %: 47 %
Platelets: 229 10*3/uL (ref 140–400)
RBC: 4 MIL/uL — ABNORMAL LOW (ref 4.20–5.80)
RDW: 14 % (ref 11.0–15.0)
WBC: 4.5 10*3/uL (ref 3.8–10.8)

## 2016-02-23 LAB — BASIC METABOLIC PANEL
BUN: 16 mg/dL (ref 7–25)
CO2: 31 mmol/L (ref 20–31)
Calcium: 9.4 mg/dL (ref 8.6–10.3)
Chloride: 106 mmol/L (ref 98–110)
Creat: 0.74 mg/dL (ref 0.70–1.11)
Glucose, Bld: 100 mg/dL — ABNORMAL HIGH (ref 65–99)
Potassium: 4.1 mmol/L (ref 3.5–5.3)
Sodium: 143 mmol/L (ref 135–146)

## 2016-02-23 LAB — HEPATIC FUNCTION PANEL
ALT: 16 U/L (ref 9–46)
AST: 16 U/L (ref 10–35)
Albumin: 4 g/dL (ref 3.6–5.1)
Alkaline Phosphatase: 46 U/L (ref 40–115)
Bilirubin, Direct: 0.2 mg/dL (ref <=0.2)
Indirect Bilirubin: 0.5 mg/dL (ref 0.2–1.2)
Total Bilirubin: 0.7 mg/dL (ref 0.2–1.2)
Total Protein: 6.8 g/dL (ref 6.1–8.1)

## 2016-02-23 LAB — LIPID PANEL
Cholesterol: 145 mg/dL (ref 125–200)
HDL: 56 mg/dL (ref >=40)
LDL Cholesterol: 67 mg/dL (ref <130)
Total CHOL/HDL Ratio: 2.6 Ratio (ref <=5.0)
Triglycerides: 109 mg/dL (ref <150)
VLDL: 22 mg/dL (ref <30)

## 2016-03-04 DIAGNOSIS — Z961 Presence of intraocular lens: Secondary | ICD-10-CM | POA: Diagnosis not present

## 2016-03-16 DIAGNOSIS — M0609 Rheumatoid arthritis without rheumatoid factor, multiple sites: Secondary | ICD-10-CM | POA: Diagnosis not present

## 2016-04-01 DIAGNOSIS — L570 Actinic keratosis: Secondary | ICD-10-CM | POA: Diagnosis not present

## 2016-04-01 DIAGNOSIS — D485 Neoplasm of uncertain behavior of skin: Secondary | ICD-10-CM | POA: Diagnosis not present

## 2016-04-01 DIAGNOSIS — Z85828 Personal history of other malignant neoplasm of skin: Secondary | ICD-10-CM | POA: Diagnosis not present

## 2016-04-01 DIAGNOSIS — C44729 Squamous cell carcinoma of skin of left lower limb, including hip: Secondary | ICD-10-CM | POA: Diagnosis not present

## 2016-04-01 DIAGNOSIS — L821 Other seborrheic keratosis: Secondary | ICD-10-CM | POA: Diagnosis not present

## 2016-04-13 DIAGNOSIS — M0609 Rheumatoid arthritis without rheumatoid factor, multiple sites: Secondary | ICD-10-CM | POA: Diagnosis not present

## 2016-04-14 DIAGNOSIS — M069 Rheumatoid arthritis, unspecified: Secondary | ICD-10-CM | POA: Diagnosis not present

## 2016-04-14 DIAGNOSIS — M4302 Spondylolysis, cervical region: Secondary | ICD-10-CM | POA: Diagnosis not present

## 2016-04-28 DIAGNOSIS — R5383 Other fatigue: Secondary | ICD-10-CM | POA: Diagnosis not present

## 2016-04-28 DIAGNOSIS — H6990 Unspecified Eustachian tube disorder, unspecified ear: Secondary | ICD-10-CM | POA: Diagnosis not present

## 2016-04-28 DIAGNOSIS — J309 Allergic rhinitis, unspecified: Secondary | ICD-10-CM | POA: Diagnosis not present

## 2016-04-28 DIAGNOSIS — R05 Cough: Secondary | ICD-10-CM | POA: Diagnosis not present

## 2016-05-03 ENCOUNTER — Encounter: Payer: Self-pay | Admitting: Internal Medicine

## 2016-05-07 ENCOUNTER — Ambulatory Visit (INDEPENDENT_AMBULATORY_CARE_PROVIDER_SITE_OTHER): Payer: Medicare Other | Admitting: Internal Medicine

## 2016-05-07 ENCOUNTER — Encounter: Payer: Self-pay | Admitting: Internal Medicine

## 2016-05-07 VITALS — BP 110/60 | HR 64 | Temp 98.0°F | Wt 185.0 lb

## 2016-05-07 DIAGNOSIS — M5440 Lumbago with sciatica, unspecified side: Secondary | ICD-10-CM

## 2016-05-07 DIAGNOSIS — I25119 Atherosclerotic heart disease of native coronary artery with unspecified angina pectoris: Secondary | ICD-10-CM | POA: Diagnosis not present

## 2016-05-07 DIAGNOSIS — J209 Acute bronchitis, unspecified: Secondary | ICD-10-CM

## 2016-05-07 DIAGNOSIS — G8929 Other chronic pain: Secondary | ICD-10-CM

## 2016-05-07 DIAGNOSIS — R059 Cough, unspecified: Secondary | ICD-10-CM | POA: Insufficient documentation

## 2016-05-07 DIAGNOSIS — R05 Cough: Secondary | ICD-10-CM

## 2016-05-07 DIAGNOSIS — I10 Essential (primary) hypertension: Secondary | ICD-10-CM | POA: Diagnosis not present

## 2016-05-07 DIAGNOSIS — M069 Rheumatoid arthritis, unspecified: Secondary | ICD-10-CM

## 2016-05-07 MED ORDER — CEFDINIR 300 MG PO CAPS: 300.0000 mg | ORAL_CAPSULE | Freq: Two times a day (BID) | ORAL | Status: DC

## 2016-05-07 MED ORDER — UMECLIDINIUM-VILANTEROL 62.5-25 MCG/INH IN AEPB: 1.0000 | INHALATION_SPRAY | Freq: Every day | RESPIRATORY_TRACT | Status: DC

## 2016-05-07 NOTE — Assessment & Plan Note (Signed)
Cefdinir bid x 10 d

## 2016-05-07 NOTE — Progress Notes (Signed)
Pre visit review using our clinic review tool, if applicable. No additional management support is needed unless otherwise documented below in the visit note. 

## 2016-05-07 NOTE — Assessment & Plan Note (Signed)
Will change from Lisinopril if cough is not better in 1-2 wks

## 2016-05-07 NOTE — Progress Notes (Signed)
Subjective:  Patient ID: [name], male    DOB: 11-Oct-1933  Age: 82 y.o. MRN: [id number]  CC: Cough   HPI [name] [name] presents for cough x 3 wks; coughing up green mucus. Meds don't help. He went to UC 1 wk ago. He has been tired and achy. [name] is on Lisinopril for HTN. LBP/RA pains  Outpatient Prescriptions Prior to Visit  Medication Sig Dispense Refill  . Abatacept (ORENCIA [place]) Inject 1 Applicatorful into the skin See admin instructions. Every 4 weeks    . apixaban (ELIQUIS) 5 MG TABS tablet Take 5 mg by mouth 2 (two) times daily.    [name] aspirin 81 MG tablet Take 81 mg by mouth daily.    . Cholecalciferol (VITAMIN D) 1000 UNITS capsule Take 1,000 Units by mouth daily.     . Cyanocobalamin 2500 MCG SUBL Place 1 tablet under the tongue daily.     . DULoxetine (CYMBALTA) 60 MG capsule Take 60 mg by mouth daily.    . DULoxetine (CYMBALTA) 60 MG capsule TAKE ONE CAPSULE EACH DAY 30 capsule 11  . ezetimibe (ZETIA) 10 MG tablet Take 5 mg by mouth daily. Take 1/2 tablet by mouth daily.    [name] levofloxacin (LEVAQUIN) 500 MG tablet Take 1 tablet (500 mg total) by mouth daily. 10 tablet 0  . lisinopril (PRINIVIL,ZESTRIL) 5 MG tablet TAKE ONE TABLET EACH DAY 90 tablet 1  . LORazepam (ATIVAN) 0.5 MG tablet Take 0.5 mg by mouth every 8 (eight) hours. Take 1-2 tablets by mouth daily as needed for anxiety.    . metoprolol tartrate (LOPRESSOR) 25 MG tablet Take 37.5 mg by mouth 2 (two) times daily. Take 1 and a 1/2 tablets by mouth daily.    . metoprolol tartrate (LOPRESSOR) 25 MG tablet TAKE 1+1/2 TABLETS BY MOUTH TWICE DAILY 270 tablet 0  . nitroGLYCERIN (NITROSTAT) 0.4 MG SL tablet Place 0.4 mg under the tongue every 5 (five) minutes as needed for chest pain (max 3 pills a day).    [name] omeprazole (PRILOSEC) 40 MG capsule Take 40 mg by mouth daily.    [name] oxyCODONE-acetaminophen (PERCOCET/ROXICET) 5-325 MG tablet TAKE ONE TABLET EVERY 6 HOURS AS NEEDED FOR SEVERE PAIN 100 tablet 0  . predniSONE  (DELTASONE) 1 MG tablet Take 3 mg by mouth daily with breakfast.     . Propylene Glycol (SYSTANE BALANCE OP) Apply 1 drop to eye daily as needed (dry eyeS).     [name] rosuvastatin (CRESTOR) 10 MG tablet Take 10 mg by mouth daily.    [name] zolpidem (AMBIEN) 10 MG tablet Take 1 tablet (10 mg total) by mouth at bedtime as needed. for sleep 90 tablet 0   No facility-administered medications prior to visit.    ROS Review of Systems  Constitutional: Negative for appetite change, fatigue and unexpected weight change.  HENT: Negative for congestion, nosebleeds, sneezing, sore throat and trouble swallowing.   Eyes: Negative for itching and visual disturbance.  Respiratory: Negative for cough.   Cardiovascular: Negative for chest pain, palpitations and leg swelling.  Gastrointestinal: Negative for nausea, diarrhea, blood in stool and abdominal distention.  Genitourinary: Negative for frequency and hematuria.  Musculoskeletal: Negative for back pain, joint swelling, gait problem and neck pain.  Skin: Negative for rash.  Neurological: Negative for dizziness, tremors, speech difficulty and weakness.  Psychiatric/Behavioral: Negative for sleep disturbance, dysphoric mood and agitation. The patient is not nervous/anxious.     Objective:  BP 110/60 mmHg  Pulse 64  Temp(Src) 98 F (36.7 C) (Oral)  Wt 185 lb (83.915 kg)  SpO2 96%  BP Readings from Last 3 Encounters:  05/07/16 110/60  12/10/15 124/68  11/25/15 122/76    Wt Readings from Last 3 Encounters:  05/07/16 185 lb (83.915 kg)  12/10/15 186 lb 3.2 oz (84.46 kg)  11/25/15 186 lb 6.4 oz (84.55 kg)    Physical Exam  Constitutional: He is oriented to person, place, and time. He appears well-developed. No distress.  NAD  HENT:  Mouth/Throat: Oropharynx is clear and moist.  Eyes: Conjunctivae are normal. Pupils are equal, round, and reactive to light.  Neck: Normal range of motion. No JVD present. No thyromegaly present.  Cardiovascular:  Normal rate, regular rhythm, normal heart sounds and intact distal pulses.  Exam reveals no gallop and no friction rub.   No murmur heard. Pulmonary/Chest: Effort normal and breath sounds normal. No respiratory distress. He has no wheezes. He has no rales. He exhibits no tenderness.  Abdominal: Soft. Bowel sounds are normal. He exhibits no distension and no mass. There is no tenderness. There is no rebound and no guarding.  Musculoskeletal: Normal range of motion. He exhibits tenderness. He exhibits no edema.  Lymphadenopathy:    He has no cervical adenopathy.  Neurological: He is alert and oriented to person, place, and time. He has normal reflexes. No cranial nerve deficit. He exhibits normal muscle tone. He displays a negative Romberg sign. Coordination and gait normal.  Skin: Skin is warm and dry. No rash noted.  Psychiatric: He has a normal mood and affect. His behavior is normal. Judgment and thought content normal.   I personally provided Anoro inhaler use teaching. After the teaching patient was able to demonstrate it's use effectively. All questions were answered  Lab Results  Component Value Date   WBC 4.5 02/23/2016   HGB 13.0* 02/23/2016   HCT 39.1 02/23/2016   PLT 229 02/23/2016   GLUCOSE 100* 02/23/2016   CHOL 145 02/23/2016   TRIG 109 02/23/2016   HDL 56 02/23/2016   LDLDIRECT 168.9 05/24/2013   LDLCALC 67 02/23/2016   ALT 16 02/23/2016   AST 16 02/23/2016   NA 143 02/23/2016   K 4.1 02/23/2016   CL 106 02/23/2016   CREATININE 0.74 02/23/2016   BUN 16 02/23/2016   CO2 31 02/23/2016   TSH 1.015 10/07/2015   PSA 1.52 06/04/2011   INR 0.98 02/20/2015   HGBA1C 6.2* 01/08/2014    No results found.  Assessment & Plan:   There are no diagnoses linked to this encounter. I am having [name] maintain his Cyanocobalamin, Vitamin D, aspirin, Abatacept (ORENCIA [name]), Propylene Glycol (SYSTANE BALANCE OP), predniSONE, levofloxacin, nitroGLYCERIN, rosuvastatin,  DULoxetine, apixaban, LORazepam, metoprolol tartrate, omeprazole, ezetimibe, lisinopril, oxyCODONE-acetaminophen, DULoxetine, zolpidem, and metoprolol tartrate.  No orders of the defined types were placed in this encounter.     Follow-up: No Follow-up on file.  [name], MD

## 2016-05-07 NOTE — Assessment & Plan Note (Signed)
[place] 

## 2016-05-07 NOTE — Assessment & Plan Note (Signed)
Chronic  Percocet prn  Potential benefits of a long term opioids use as well as potential risks (i.e. addiction risk, apnea etc) and complications (i.e. Somnolence, constipation and others) were explained to the patient and were aknowledged. 

## 2016-05-07 NOTE — Assessment & Plan Note (Signed)
Will change from Lisinopril if cough is not better in 1-2 wks Start Anoro

## 2016-05-10 ENCOUNTER — Ambulatory Visit: Payer: Self-pay | Admitting: Internal Medicine

## 2016-05-13 ENCOUNTER — Ambulatory Visit (INDEPENDENT_AMBULATORY_CARE_PROVIDER_SITE_OTHER): Payer: Medicare Other | Admitting: Family

## 2016-05-13 ENCOUNTER — Ambulatory Visit (INDEPENDENT_AMBULATORY_CARE_PROVIDER_SITE_OTHER)
Admission: RE | Admit: 2016-05-13 | Discharge: 2016-05-13 | Disposition: A | Discharge status: Home / Self Care | Payer: Medicare Other | Source: Ambulatory Visit | Attending: Family | Admitting: Family

## 2016-05-13 ENCOUNTER — Encounter: Payer: Self-pay | Admitting: Family

## 2016-05-13 VITALS — BP 130/82 | HR 62 | Temp 97.6°F | Ht 72.0 in | Wt 182.0 lb

## 2016-05-13 DIAGNOSIS — R05 Cough: Secondary | ICD-10-CM | POA: Diagnosis not present

## 2016-05-13 DIAGNOSIS — I25119 Atherosclerotic heart disease of native coronary artery with unspecified angina pectoris: Secondary | ICD-10-CM | POA: Diagnosis not present

## 2016-05-13 DIAGNOSIS — R059 Cough, unspecified: Secondary | ICD-10-CM

## 2016-05-13 MED ORDER — ALBUTEROL SULFATE HFA 108 (90 BASE) MCG/ACT IN AERS: 2.0000 | INHALATION_SPRAY | Freq: Four times a day (QID) | RESPIRATORY_TRACT | Status: DC | PRN

## 2016-05-13 NOTE — Progress Notes (Signed)
Pre visit review using our clinic review tool, if applicable. No additional management support is needed unless otherwise documented below in the visit note. 

## 2016-05-13 NOTE — Progress Notes (Signed)
Subjective:    Patient ID: [name], male    DOB: 08-Sep-1933, 82 y.o.   MRN: [id number]   [name] is a 82 y.o. male who presents today for an acute visit.    HPI Comments: Patient here for reevaluation of productive cough for 3 weeks, waxing and waning. Was seen last week and treated with cefdinir, with some improvement, and has approx 3 days left. Cough worse in the morning and at night. Endorses occasional wheezing. Using Anoro sample however not finding significant relief. No fever, SOB, epigastric burning. Had sinus headaches which have resolved.[place] liquid which took once however hasnt seen any change . Has also tried OTC cough syrup with codeine which is helping.   Past Medical History  Diagnosis Date  . Colon polyp   . Hyperlipidemia   . Osteoarthritis   . Migraine with aura   . RA (rheumatoid arthritis) ([place])   . Premature atrial contractions   . GERD (gastroesophageal reflux disease)   . Hypertension   . CAD (coronary artery disease) 12/2013    s/p inferior STEMI 01/08/2014; LHC 01/08/14: total RCA occlusion s/p 3.5x39mm Xience DES distal RCA and 3.5x28 mm DES mid RCA, 60-70% mid LAD stenosis, EF 55%  . Palpitations   . STEMI (ST elevation myocardial infarction) ([place])   . Hx of echocardiogram     ETT- Myoview (1/16):  diaph atten, EF 63%, Low Risk  . Paroxysmal atrial fibrillation ([place]) 4/16    chads2vasc score is at least 4  . OSA (obstructive sleep apnea) 06/19/2015    Moderate OSA with AHI 18/hr  . Sinusitis, chronic 10/17/2015   Allergies: Methotrexate; Atorvastatin; and Pravastatin sodium Current Outpatient Prescriptions on File Prior to Visit  Medication Sig Dispense Refill  . Abatacept (ORENCIA [place]) Inject 1 Applicatorful into the skin See admin instructions. Every 4 weeks    . apixaban (ELIQUIS) 5 MG TABS tablet Take 5 mg by mouth 2 (two) times daily.    [name] aspirin 81 MG tablet Take 81 mg by mouth daily.     . cefdinir (OMNICEF) 300 MG capsule Take 1 capsule (300 mg total) by mouth 2 (two) times daily. 20 capsule 0  . Cholecalciferol (VITAMIN D) 1000 UNITS capsule Take 1,000 Units by mouth daily.     . Cyanocobalamin 2500 MCG SUBL Place 1 tablet under the tongue daily.     . DULoxetine (CYMBALTA) 60 MG capsule Take 60 mg by mouth daily.    [name] ezetimibe (ZETIA) 10 MG tablet Take 5 mg by mouth daily. Take 1/2 tablet by mouth daily.    [name] lisinopril (PRINIVIL,ZESTRIL) 5 MG tablet TAKE ONE TABLET EACH DAY 90 tablet 1  . LORazepam (ATIVAN) 0.5 MG tablet Take 0.5 mg by mouth every 8 (eight) hours. Take 1-2 tablets by mouth daily as needed for anxiety.    . metoprolol tartrate (LOPRESSOR) 25 MG tablet Take 37.5 mg by mouth 2 (two) times daily. Take 1 and a 1/2 tablets by mouth daily.    [name] omeprazole (PRILOSEC) 40 MG capsule Take 40 mg by mouth daily.    [name] oxyCODONE-acetaminophen (PERCOCET/ROXICET) 5-325 MG tablet TAKE ONE TABLET EVERY 6 HOURS AS NEEDED FOR SEVERE PAIN 100 tablet 0  . predniSONE (DELTASONE) 1 MG tablet Take 3 mg by mouth daily with breakfast.     . Propylene Glycol (SYSTANE BALANCE OP) Apply 1 drop to eye daily as needed (dry eyeS).     [name]  rosuvastatin (CRESTOR) 10 MG tablet Take 10 mg by mouth daily.    [name] umeclidinium-vilanterol (ANORO ELLIPTA) 62.5-25 MCG/INH AEPB Inhale 1 puff into the lungs daily. 1 each 1  . zolpidem (AMBIEN) 10 MG tablet Take 1 tablet (10 mg total) by mouth at bedtime as needed. for sleep 90 tablet 0  . nitroGLYCERIN (NITROSTAT) 0.4 MG SL tablet Place 0.4 mg under the tongue every 5 (five) minutes as needed for chest pain (max 3 pills a day). Reported on 05/13/2016     No current facility-administered medications on file prior to visit.    Social History  Substance Use Topics  . Smoking status: Former Research scientist (life sciences)  . Smokeless tobacco: Never Used     Comment: Quit 50 yrs ago (as of 2015)  . Alcohol Use: 4.2 - 8.4 oz/week    7-14 Glasses of wine per week     Comment: 1-2  glasses of wine nightly    Review of Systems  Constitutional: Negative for fever and chills.  HENT: Positive for congestion. Negative for ear pain, rhinorrhea, sinus pressure and sore throat.   Respiratory: Positive for cough and wheezing. Negative for shortness of breath.   Cardiovascular: Negative for chest pain and palpitations.  Gastrointestinal: Negative for nausea, vomiting and diarrhea.  Musculoskeletal: Negative for myalgias.  Neurological: Negative for headaches.      Objective:    BP 130/82 mmHg  Pulse 62  Temp(Src) 97.6 F (36.4 C) (Oral)  Ht 6' (1.829 m)  Wt 182 lb (82.555 kg)  BMI 24.68 kg/m2  SpO2 95%   Physical Exam  Constitutional: Vital signs are normal. He appears well-developed and well-nourished.  HENT:  Head: Normocephalic and atraumatic.  Right Ear: Hearing, tympanic membrane, external ear and ear canal normal. No drainage, swelling or tenderness. Tympanic membrane is not injected, not erythematous and not bulging. No middle ear effusion. No decreased hearing is noted.  Left Ear: Hearing, tympanic membrane, external ear and ear canal normal. No drainage, swelling or tenderness. Tympanic membrane is not injected, not erythematous and not bulging.  No middle ear effusion. No decreased hearing is noted.  Nose: Nose normal. Right sinus exhibits no maxillary sinus tenderness and no frontal sinus tenderness. Left sinus exhibits no maxillary sinus tenderness and no frontal sinus tenderness.  Mouth/Throat: Uvula is midline, oropharynx is clear and moist and mucous membranes are normal. No oropharyngeal exudate, posterior oropharyngeal edema, posterior oropharyngeal erythema or tonsillar abscesses.  Eyes: Conjunctivae are normal.  Cardiovascular: Regular rhythm and normal heart sounds.   Pulmonary/Chest: Effort normal and breath sounds normal. No respiratory distress. He has no decreased breath sounds. He has no wheezes. He has no rhonchi. He has no rales.    Lymphadenopathy:       Head (right side): No submental, no submandibular, no tonsillar, no preauricular, no posterior auricular and no occipital adenopathy present.       Head (left side): No submental, no submandibular, no tonsillar, no preauricular, no posterior auricular and no occipital adenopathy present.    He has no cervical adenopathy.  Neurological: He is alert.  Skin: Skin is warm and dry.  Psychiatric: He has a normal mood and affect. His speech is normal and behavior is normal.  Vitals reviewed.      Assessment & Plan:   1. Cough Suspect post viral cough and have advised patient to finish Cefdinir. Cough is productive unlikely to be caused by ACE inhibitor. Patient has no GERD symptoms. No acute respiratory distress. SaO2 95.To ensure  no acute lung process, have ordered chest x-ray for further evaluation. Have also advised patient that he may trial albuterol inhaler as perhaps may have more effect on occasional wheeze over Anoro.   - DG Chest 2 View - albuterol (PROVENTIL HFA) 108 (90 Base) MCG/ACT inhaler; Inhale 2 puffs into the lungs every 6 (six) hours as needed for wheezing or shortness of breath.  Dispense: 1 Inhaler; Refill: 1    I am having [name] maintain his Cyanocobalamin, Vitamin D, aspirin, Abatacept (ORENCIA [place]), Propylene Glycol (SYSTANE BALANCE OP), predniSONE, nitroGLYCERIN, rosuvastatin, DULoxetine, apixaban, LORazepam, metoprolol tartrate, omeprazole, ezetimibe, lisinopril, oxyCODONE-acetaminophen, zolpidem, cefdinir, and umeclidinium-vilanterol.   No orders of the defined types were placed in this encounter.     Start medications as prescribed and explained to patient on After Visit Summary ( AVS). Risks, benefits, and alternatives of the medications and treatment plan prescribed today were discussed, and patient expressed understanding.   Education regarding symptom management and diagnosis given to patient.   Follow-up:Plan follow-up and return  precautions given if any worsening symptoms or change in condition.   Continue to follow with [name], MD for routine health maintenance.   [name] and I agreed with plan.   Mable Paris, FNP

## 2016-05-13 NOTE — Patient Instructions (Signed)
Chest xray.   Will call with results.   If there is no improvement in your symptoms, or if there is any worsening of symptoms, or if you have any additional concerns, please return for re-evaluation; or, if we are closed, consider going to the Emergency Room for evaluation if symptoms urgent.  Increase intake of clear fluids. Congestion is best treated by hydration, when mucus is wetter, it is thinner, less sticky, and easier to expel from the body, either through coughing up drainage, or by blowing your nose.   Get plenty of rest.   Use saline nasal drops and blow your nose frequently. Run a humidifier at night and elevate the head of the bed. Vicks Vapor rub will help with congestion and cough. Steam showers and sinus massage for congestion.   Use Acetaminophen or Ibuprofen as needed for fever or pain. Avoid second hand smoke. Even the smallest exposure will worsen symptoms.   Over the counter medications you can try include Delsym for cough, a decongestant for congestion, and Mucinex or Robitussin as an expectorant. Be sure to just get the plain Mucinex or Robitussin that just has one medication (Guaifenesen). We don't recommend the combination products. Note, be sure to drink two glasses of water with each dose of Mucinex as the medication will not work well without adequate hydration.   You can also try a teaspoon of honey to see if this will help reduce cough. Throat lozenges can sometimes be beneficial as well.    This illness will typically last 7 - 10 days.   Please follow up with our clinic if you develop a fever greater than 101 F, symptoms worsen, or do not resolve in the next week.

## 2016-05-17 ENCOUNTER — Encounter: Payer: Self-pay | Admitting: Family

## 2016-05-17 DIAGNOSIS — R05 Cough: Secondary | ICD-10-CM

## 2016-05-17 DIAGNOSIS — R059 Cough, unspecified: Secondary | ICD-10-CM

## 2016-05-19 MED ORDER — BENZONATATE 100 MG PO CAPS: 100.0000 mg | ORAL_CAPSULE | Freq: Three times a day (TID) | ORAL | Status: DC | PRN

## 2016-05-19 MED ORDER — AZITHROMYCIN 250 MG PO TABS: ORAL_TABLET | ORAL | Status: DC

## 2016-05-19 NOTE — Telephone Encounter (Signed)
Called pt and informed rx was sent in.

## 2016-05-19 NOTE — Telephone Encounter (Signed)
Called pharmacy and canceled abx. Please call patient and let him know I sent rx for cough medication.

## 2016-05-19 NOTE — Telephone Encounter (Signed)
Please call patient and let him know that I have sent in another antibiotic. It is from a different drug class. However, if he does not show improvement after the 5 day course, he needs to come back in.   If any acute symptoms, please advise him to go to ED.

## 2016-05-20 ENCOUNTER — Encounter: Payer: Self-pay | Admitting: Family

## 2016-05-21 ENCOUNTER — Other Ambulatory Visit: Payer: Self-pay | Admitting: Internal Medicine

## 2016-05-21 ENCOUNTER — Encounter: Payer: Self-pay | Admitting: Family

## 2016-05-26 ENCOUNTER — Encounter: Payer: Self-pay | Admitting: Cardiology

## 2016-05-26 ENCOUNTER — Ambulatory Visit (INDEPENDENT_AMBULATORY_CARE_PROVIDER_SITE_OTHER): Payer: Medicare Other | Admitting: Cardiology

## 2016-05-26 VITALS — BP 128/72 | HR 68 | Ht 72.0 in | Wt 184.2 lb

## 2016-05-26 DIAGNOSIS — G4733 Obstructive sleep apnea (adult) (pediatric): Secondary | ICD-10-CM | POA: Diagnosis not present

## 2016-05-26 DIAGNOSIS — I25119 Atherosclerotic heart disease of native coronary artery with unspecified angina pectoris: Secondary | ICD-10-CM

## 2016-05-26 DIAGNOSIS — M0609 Rheumatoid arthritis without rheumatoid factor, multiple sites: Secondary | ICD-10-CM | POA: Diagnosis not present

## 2016-05-26 DIAGNOSIS — I10 Essential (primary) hypertension: Secondary | ICD-10-CM | POA: Diagnosis not present

## 2016-05-26 NOTE — Patient Instructions (Signed)

## 2016-05-26 NOTE — Progress Notes (Signed)
Cardiology Office Note    Date:  05/26/2016   ID:  [name], [name] [birth year], MRN [id number]  PCP:  [name], MD  Cardiologist:  Evan Him, MD   Chief Complaint  Patient presents with  . Sleep Apnea  . Hypertension    History of Present Illness:  [name] is a 82 y.o. male who presents for followup of OSA. He has a history of atrial fibrillation and was referred by Dr. Rayann Heman for sleep study. He was found to have moderate OSA with an AHI of 18/hr. He is now on 8cm H2O. He presents back today for followup. He is doing well with his device. He tolerates the nasal mask and feels the pressure is adequate. Since going on CPAP he sleeps much better and feels rested in the am with less daytime sleepiness. He has had some nasal congestion due to recent bronchitis. He does not use a chin strap.    Past Medical History  Diagnosis Date  . Colon polyp   . Hyperlipidemia   . Osteoarthritis   . Migraine with aura   . RA (rheumatoid arthritis) ([place])   . Premature atrial contractions   . GERD (gastroesophageal reflux disease)   . Hypertension   . CAD (coronary artery disease) 12/2013    s/p inferior STEMI 01/08/2014; LHC 01/08/14: total RCA occlusion s/p 3.5x76mm Xience DES distal RCA and 3.5x28 mm DES mid RCA, 60-70% mid LAD stenosis, EF 55%  . Palpitations   . STEMI (ST elevation myocardial infarction) ([place])   . Hx of echocardiogram     ETT- Myoview (1/16):  diaph atten, EF 63%, Low Risk  . Paroxysmal atrial fibrillation ([place]) 4/16    chads2vasc score is at least 4  . OSA (obstructive sleep apnea) 06/19/2015    Moderate OSA with AHI 18/hr  . Sinusitis, chronic 10/17/2015    Past Surgical History  Procedure Laterality Date  . Back surgery  2004  . Left heart catheterization with coronary angiogram N/A 01/08/2014    Procedure: LEFT HEART CATHETERIZATION WITH CORONARY ANGIOGRAM;  Surgeon: Troy Sine, MD;  Location: [facility] CATH LAB;  Service: Cardiovascular;   Laterality: N/A;  . Percutaneous coronary stent intervention (pci-s)  01/08/2014    Procedure: PERCUTANEOUS CORONARY STENT INTERVENTION (PCI-S);  Surgeon: Troy Sine, MD;  Location: [facility] CATH LAB;  Service: Cardiovascular;;    Current Medications: Outpatient Prescriptions Prior to Visit  Medication Sig Dispense Refill  . Abatacept (ORENCIA [place]) Inject 1 Applicatorful into the skin See admin instructions. Every 4 weeks    . albuterol (PROVENTIL HFA) 108 (90 Base) MCG/ACT inhaler Inhale 2 puffs into the lungs every 6 (six) hours as needed for wheezing or shortness of breath. 1 Inhaler 1  . apixaban (ELIQUIS) 5 MG TABS tablet Take 5 mg by mouth 2 (two) times daily.    [name] aspirin 81 MG tablet Take 81 mg by mouth daily.    . benzonatate (TESSALON) 100 MG capsule Take 1 capsule (100 mg total) by mouth 3 (three) times daily as needed for cough. 20 capsule 1  . Cholecalciferol (VITAMIN D) 1000 UNITS capsule Take 1,000 Units by mouth daily.     . Cyanocobalamin 2500 MCG SUBL Place 1 tablet under the tongue daily.     . DULoxetine (CYMBALTA) 60 MG capsule Take 60 mg by mouth daily.    [name] ezetimibe (ZETIA) 10 MG tablet Take 5 mg by mouth daily. Take 1/2 tablet by mouth daily.    [name]  lisinopril (PRINIVIL,ZESTRIL) 5 MG tablet TAKE ONE TABLET EACH DAY 90 tablet 1  . LORazepam (ATIVAN) 0.5 MG tablet Take 0.5 mg by mouth every 8 (eight) hours. Take 1-2 tablets by mouth daily as needed for anxiety.    . metoprolol tartrate (LOPRESSOR) 25 MG tablet Take 37.5 mg by mouth 2 (two) times daily. Take 1 and a 1/2 tablets by mouth daily.    . nitroGLYCERIN (NITROSTAT) 0.4 MG SL tablet Place 0.4 mg under the tongue every 5 (five) minutes as needed for chest pain (max 3 pills a day). Reported on 05/13/2016    . omeprazole (PRILOSEC) 40 MG capsule Take 40 mg by mouth daily.    [name] oxyCODONE-acetaminophen (PERCOCET/ROXICET) 5-325 MG tablet TAKE ONE TABLET EVERY 6 HOURS AS NEEDED FOR SEVERE PAIN 100 tablet 0  . predniSONE  (DELTASONE) 1 MG tablet Take 3 mg by mouth daily with breakfast.     . Propylene Glycol (SYSTANE BALANCE OP) Apply 1 drop to eye daily as needed (dry eyeS).     [name] rosuvastatin (CRESTOR) 10 MG tablet TAKE ONE TABLET EACH DAY 30 tablet 5  . umeclidinium-vilanterol (ANORO ELLIPTA) 62.5-25 MCG/INH AEPB Inhale 1 puff into the lungs daily. 1 each 1  . zolpidem (AMBIEN) 10 MG tablet Take 1 tablet (10 mg total) by mouth at bedtime as needed. for sleep 90 tablet 0  . cefdinir (OMNICEF) 300 MG capsule Take 1 capsule (300 mg total) by mouth 2 (two) times daily. (Patient not taking: Reported on 05/26/2016) 20 capsule 0  . rosuvastatin (CRESTOR) 10 MG tablet Take 10 mg by mouth daily. Reported on 05/26/2016     No facility-administered medications prior to visit.     Allergies:   Methotrexate; Atorvastatin; and Pravastatin sodium   Social History   Social History  . Marital Status: Married    Spouse Name: N/A  . Number of Children: 2  . Years of Education: N/A   Occupational History  . Retired    Social History Main Topics  . Smoking status: Former Research scientist (life sciences)  . Smokeless tobacco: Never Used     Comment: Quit 50 yrs ago (as of 2015)  . Alcohol Use: 4.2 - 8.4 oz/week    7-14 Glasses of wine per week     Comment: 1-2 glasses of wine nightly  . Drug Use: No  . Sexual Activity: Yes   Other Topics Concern  . None   Social History Narrative   1 Caffeine drink daily      Family History:  The patient's family history includes Heart disease in his father; Hypertension in his mother. There is no history of Colon cancer.   ROS:   Please see the history of present illness.    ROS All other systems reviewed and are negative.   PHYSICAL EXAM:   VS:  BP 128/72 mmHg  Pulse 68  Ht 6' (1.829 m)  Wt 184 lb 3.2 oz (83.553 kg)  BMI 24.98 kg/m2  SpO2 98%   GEN: Well nourished, well developed, in no acute distress HEENT: normal Neck: no JVD, carotid bruits, or masses Cardiac: RRR; no murmurs, rubs,  or gallops,no edema.  Intact distal pulses bilaterally.  Respiratory:  clear to auscultation bilaterally, normal work of breathing GI: soft, nontender, nondistended, + BS MS: no deformity or atrophy Skin: warm and dry, no rash Neuro:  Alert and Oriented x 3, Strength and sensation are intact Psych: euthymic mood, full affect  Wt Readings from Last 3 Encounters:  05/26/16 184  lb 3.2 oz (83.553 kg)  05/13/16 182 lb (82.555 kg)  05/07/16 185 lb (83.915 kg)      Studies/Labs Reviewed:   EKG:  EKG is not ordered today.    Recent Labs: 10/07/2015: TSH 1.015 02/23/2016: ALT 16; BUN 16; Creat 0.74; Hemoglobin 13.0*; Platelets 229; Potassium 4.1; Sodium 143   Lipid Panel    Component Value Date/Time   CHOL 145 02/23/2016 0909   TRIG 109 02/23/2016 0909   HDL 56 02/23/2016 0909   CHOLHDL 2.6 02/23/2016 0909   VLDL 22 02/23/2016 0909   LDLCALC 67 02/23/2016 0909   LDLDIRECT 168.9 05/24/2013 0732    Additional studies/ records that were reviewed today include:  CPAP download    ASSESSMENT:    1. OSA (obstructive sleep apnea)   2. Essential hypertension      PLAN:  In order of problems listed above:  OSA - the patient is tolerating PAP therapy well without any problems. The PAP download was reviewed today and showed an AHI of 4.2/hr on 8 cm H2O with 69% compliance in using more than 4 hours nightly.  The patient has been using and benefiting from CPAP use and will continue to benefit from therapy.   He says that he fell short on his compliance due to not being able to get his machine clean but now has the cleaning supplies. HTN - BP controlled on current medical regimen.  Continue ACE I/BB     Medication Adjustments/Labs and Tests Ordered: Current medicines are reviewed at length with the patient today.  Concerns regarding medicines are outlined above.  Medication changes, Labs and Tests ordered today are listed in the Patient Instructions below.  There are no Patient  Instructions on file for this visit.   Signed, Evan Him, MD  05/26/2016 10:07 AM    [address], [place], [place]  [address] Phone: [phone]; Fax: [phone]

## 2016-05-29 ENCOUNTER — Encounter: Payer: Self-pay | Admitting: Internal Medicine

## 2016-05-29 ENCOUNTER — Other Ambulatory Visit: Payer: Self-pay | Admitting: Internal Medicine

## 2016-06-01 ENCOUNTER — Encounter: Payer: Self-pay | Admitting: Internal Medicine

## 2016-06-01 ENCOUNTER — Ambulatory Visit (INDEPENDENT_AMBULATORY_CARE_PROVIDER_SITE_OTHER): Payer: Medicare Other | Admitting: Internal Medicine

## 2016-06-01 ENCOUNTER — Encounter: Payer: Self-pay | Admitting: Family

## 2016-06-01 VITALS — BP 122/70 | HR 76 | Temp 97.8°F | Resp 20 | Wt 182.0 lb

## 2016-06-01 DIAGNOSIS — J4521 Mild intermittent asthma with (acute) exacerbation: Secondary | ICD-10-CM

## 2016-06-01 DIAGNOSIS — J309 Allergic rhinitis, unspecified: Secondary | ICD-10-CM | POA: Diagnosis not present

## 2016-06-01 DIAGNOSIS — I10 Essential (primary) hypertension: Secondary | ICD-10-CM

## 2016-06-01 DIAGNOSIS — I25119 Atherosclerotic heart disease of native coronary artery with unspecified angina pectoris: Secondary | ICD-10-CM

## 2016-06-01 DIAGNOSIS — R062 Wheezing: Secondary | ICD-10-CM | POA: Diagnosis not present

## 2016-06-01 MED ORDER — METHYLPREDNISOLONE ACETATE 40 MG/ML IJ SUSP
40.0000 mg | Freq: Once | INTRAMUSCULAR | Status: AC
  Administered 2016-06-01: 40 mg via INTRAMUSCULAR

## 2016-06-01 MED ORDER — BUDESONIDE-FORMOTEROL FUMARATE 160-4.5 MCG/ACT IN AERO: 2.0000 | INHALATION_SPRAY | Freq: Two times a day (BID) | RESPIRATORY_TRACT | Status: DC

## 2016-06-01 MED ORDER — METHYLPREDNISOLONE ACETATE 40 MG/ML IJ SUSP: 40.0000 mg | Freq: Once | INTRAMUSCULAR | Status: DC

## 2016-06-01 MED ORDER — PREDNISONE 10 MG PO TABS: ORAL_TABLET | ORAL | Status: DC

## 2016-06-01 NOTE — Patient Instructions (Signed)
You had the steroid shot today  OK to stop the Anoro Ellipta  Please take all new medication as prescribed - the prednisone and the symbicort  Please continue all other medications as before, and refills have been done if requested.  Please have the pharmacy call with any other refills you may need.  Please keep your appointments with your specialists as you may have planned

## 2016-06-01 NOTE — Progress Notes (Signed)
Subjective:    Patient ID: [name], male    DOB: 1933/10/09, 82 y.o.   MRN: [id number]  HPI   Here with upper resp and chest congestion intermittent but significant for 5-6 wks, has seen several providers with temporary relief but seems to keep coming back, such as 1 day now hx of rahter dramatic onset nasal allergy symptoms with clearish congestion, itch and sneezing, with scant prod clearish sputum, sob but without fever, pain, ST, swelling or overt wheezing. Started after spenidng a few hours [place] the practice [place] golfing.  Does not feel particularly ill, except for chronic fatigue with RA.  Pt denies chest pain, increased sob or doe, wheezing, orthopnea, PND, increased LE swelling, palpitations, dizziness or syncope. Does not feel anoro ellipta helping enough with these symptoms Past Medical History  Diagnosis Date  . Colon polyp   . Hyperlipidemia   . Osteoarthritis   . Migraine with aura   . RA (rheumatoid arthritis) ([place])   . Premature atrial contractions   . GERD (gastroesophageal reflux disease)   . Hypertension   . CAD (coronary artery disease) 12/2013    s/p inferior STEMI 01/08/2014; LHC 01/08/14: total RCA occlusion s/p 3.5x65mm Xience DES distal RCA and 3.5x28 mm DES mid RCA, 60-70% mid LAD stenosis, EF 55%  . Palpitations   . STEMI (ST elevation myocardial infarction) ([place])   . Hx of echocardiogram     ETT- Myoview (1/16):  diaph atten, EF 63%, Low Risk  . Paroxysmal atrial fibrillation ([place]) 4/16    chads2vasc score is at least 4  . OSA (obstructive sleep apnea) 06/19/2015    Moderate OSA with AHI 18/hr  . Sinusitis, chronic 10/17/2015   Past Surgical History  Procedure Laterality Date  . Back surgery  2004  . Left heart catheterization with coronary angiogram N/A 01/08/2014    Procedure: LEFT HEART CATHETERIZATION WITH CORONARY ANGIOGRAM;  Surgeon: Troy Sine, MD;  Location: [facility] CATH LAB;  Service: Cardiovascular;  Laterality: N/A;  . Percutaneous coronary stent  intervention (pci-s)  01/08/2014    Procedure: PERCUTANEOUS CORONARY STENT INTERVENTION (PCI-S);  Surgeon: Troy Sine, MD;  Location: [facility] CATH LAB;  Service: Cardiovascular;;    reports that he has quit smoking. He has never used smokeless tobacco. He reports that he drinks about 4.2 - 8.4 oz of alcohol per week. He reports that he does not use illicit drugs. family history includes Heart disease in his father; Hypertension in his mother. There is no history of Colon cancer. Allergies  Allergen Reactions  . Methotrexate Other (See Comments)    REACTION: tachycardia  . Lisinopril     cough  . Atorvastatin Other (See Comments)    Muscle pain, leg cramps  . Pravastatin Sodium Other (See Comments)    REACTION: cramps, fatigue   Current Outpatient Prescriptions on File Prior to Visit  Medication Sig Dispense Refill  . Abatacept (ORENCIA [place]) Inject 1 Applicatorful into the skin See admin instructions. Every 4 weeks    . albuterol (PROVENTIL HFA) 108 (90 Base) MCG/ACT inhaler Inhale 2 puffs into the lungs every 6 (six) hours as needed for wheezing or shortness of breath. 1 Inhaler 1  . apixaban (ELIQUIS) 5 MG TABS tablet Take 5 mg by mouth 2 (two) times daily.    [name] aspirin 81 MG tablet Take 81 mg by mouth daily.    . benzonatate (TESSALON) 100 MG capsule Take 1 capsule (100 mg total) by mouth 3 (three) times daily  as needed for cough. 20 capsule 1  . Cholecalciferol (VITAMIN D) 1000 UNITS capsule Take 1,000 Units by mouth daily.     . Cyanocobalamin 2500 MCG SUBL Place 1 tablet under the tongue daily.     . DULoxetine (CYMBALTA) 60 MG capsule Take 60 mg by mouth daily.    [name] ezetimibe (ZETIA) 10 MG tablet Take 5 mg by mouth daily. Take 1/2 tablet by mouth daily.    [name] lisinopril (PRINIVIL,ZESTRIL) 5 MG tablet TAKE ONE TABLET EACH DAY 90 tablet 1  . LORazepam (ATIVAN) 0.5 MG tablet Take 0.5 mg by mouth every 8 (eight) hours. Take 1-2 tablets by mouth daily as needed for anxiety.    . metoprolol  tartrate (LOPRESSOR) 25 MG tablet Take 37.5 mg by mouth 2 (two) times daily. Take 1 and a 1/2 tablets by mouth daily.    . metoprolol tartrate (LOPRESSOR) 25 MG tablet TAKE ONE AND ONE-HALF TABLET TWICE DAILY 270 tablet 3  . nitroGLYCERIN (NITROSTAT) 0.4 MG SL tablet Place 0.4 mg under the tongue every 5 (five) minutes as needed for chest pain (max 3 pills a day). Reported on 05/13/2016    . omeprazole (PRILOSEC) 40 MG capsule Take 40 mg by mouth daily.    [name] oxyCODONE-acetaminophen (PERCOCET/ROXICET) 5-325 MG tablet TAKE ONE TABLET EVERY 6 HOURS AS NEEDED FOR SEVERE PAIN 100 tablet 0  . Propylene Glycol (SYSTANE BALANCE OP) Apply 1 drop to eye daily as needed (dry eyeS).     [name] rosuvastatin (CRESTOR) 10 MG tablet TAKE ONE TABLET EACH DAY 30 tablet 5  . umeclidinium-vilanterol (ANORO ELLIPTA) 62.5-25 MCG/INH AEPB Inhale 1 puff into the lungs daily. 1 each 1  . zolpidem (AMBIEN) 10 MG tablet Take 1 tablet (10 mg total) by mouth at bedtime as needed. for sleep 90 tablet 0   No current facility-administered medications on file prior to visit.   Review of Systems  Constitutional: Negative for unusual diaphoresis or night sweats HENT: Negative for ear swelling or discharge Eyes: Negative for worsening visual haziness  Respiratory: Negative for choking and stridor.   Gastrointestinal: Negative for distension or worsening eructation Genitourinary: Negative for retention or change in urine volume.  Musculoskeletal: Negative for other MSK pain or swelling Skin: Negative for color change and worsening wound Neurological: Negative for tremors and numbness other than noted  Psychiatric/Behavioral: Negative for decreased concentration or agitation other than above       Objective:   Physical Exam BP 122/70 mmHg  Pulse 76  Temp(Src) 97.8 F (36.6 C) (Oral)  Resp 20  Wt 182 lb (82.555 kg)  SpO2 93% VS noted,  Constitutional: Pt appears in no apparent distress HENT: Head: NCAT.  Right Ear: External  ear normal.  Left Ear: External ear normal.  Bilat tm's with mild erythema.  Max sinus areas mild tender.  Pharynx with mild erythema, no exudate Eyes: . Pupils are equal, round, and reactive to light. Conjunctivae and EOM are normal Neck: Normal range of motion. Neck supple.  Cardiovascular: Normal rate and regular rhythm.   Pulmonary/Chest: Effort normal and breath sounds decreased without rales or wheezing.  Neurological: Pt is alert. Not confused , motor grossly intact Skin: Skin is warm. No rash, no LE edema Psychiatric: Pt behavior is normal. No agitation.     Assessment & Plan:

## 2016-06-01 NOTE — Progress Notes (Signed)
Pre visit review using our clinic review tool, if applicable. No additional management support is needed unless otherwise documented below in the visit note. 

## 2016-06-02 ENCOUNTER — Encounter: Payer: Self-pay | Admitting: Cardiology

## 2016-06-02 DIAGNOSIS — J45901 Unspecified asthma with (acute) exacerbation: Secondary | ICD-10-CM | POA: Insufficient documentation

## 2016-06-02 DIAGNOSIS — J309 Allergic rhinitis, unspecified: Secondary | ICD-10-CM | POA: Insufficient documentation

## 2016-06-02 NOTE — Assessment & Plan Note (Signed)
stable overall by history and exam, recent data reviewed with pt, and pt to continue medical treatment as before,  to f/u any worsening symptoms or concerns BP Readings from Last 3 Encounters:  06/01/16 122/70  05/26/16 128/72  05/13/16 130/82

## 2016-06-02 NOTE — Assessment & Plan Note (Signed)
New onset, mild without overt wheezing today but will change the anoro ellipta to steroid containing symbicort asd, to f/u any worsening symptoms or concerns

## 2016-06-02 NOTE — Assessment & Plan Note (Signed)
Mild to mod, for depomedrol IM, predpac asd, to f/u any worsening symptoms or concerns 

## 2016-06-10 ENCOUNTER — Other Ambulatory Visit: Payer: Self-pay | Admitting: Internal Medicine

## 2016-06-22 DIAGNOSIS — Z85828 Personal history of other malignant neoplasm of skin: Secondary | ICD-10-CM | POA: Diagnosis not present

## 2016-06-22 DIAGNOSIS — C44729 Squamous cell carcinoma of skin of left lower limb, including hip: Secondary | ICD-10-CM | POA: Diagnosis not present

## 2016-06-24 DIAGNOSIS — M0609 Rheumatoid arthritis without rheumatoid factor, multiple sites: Secondary | ICD-10-CM | POA: Diagnosis not present

## 2016-06-28 DIAGNOSIS — J301 Allergic rhinitis due to pollen: Secondary | ICD-10-CM | POA: Diagnosis not present

## 2016-06-28 DIAGNOSIS — R05 Cough: Secondary | ICD-10-CM | POA: Diagnosis not present

## 2016-07-07 DIAGNOSIS — C44729 Squamous cell carcinoma of skin of left lower limb, including hip: Secondary | ICD-10-CM | POA: Diagnosis not present

## 2016-07-07 DIAGNOSIS — Z85828 Personal history of other malignant neoplasm of skin: Secondary | ICD-10-CM | POA: Diagnosis not present

## 2016-07-11 ENCOUNTER — Emergency Department (HOSPITAL_COMMUNITY): Payer: Medicare Other

## 2016-07-11 ENCOUNTER — Emergency Department (HOSPITAL_COMMUNITY)
Admission: EM | Admit: 2016-07-11 | Discharge: 2016-07-11 | Disposition: A | Discharge status: Home / Self Care | Payer: Medicare Other | Attending: Emergency Medicine | Admitting: Emergency Medicine

## 2016-07-11 ENCOUNTER — Encounter (HOSPITAL_COMMUNITY): Payer: Self-pay | Admitting: Emergency Medicine

## 2016-07-11 DIAGNOSIS — Z87891 Personal history of nicotine dependence: Secondary | ICD-10-CM | POA: Diagnosis not present

## 2016-07-11 DIAGNOSIS — Z7982 Long term (current) use of aspirin: Secondary | ICD-10-CM | POA: Insufficient documentation

## 2016-07-11 DIAGNOSIS — J45901 Unspecified asthma with (acute) exacerbation: Secondary | ICD-10-CM | POA: Insufficient documentation

## 2016-07-11 DIAGNOSIS — R109 Unspecified abdominal pain: Secondary | ICD-10-CM

## 2016-07-11 DIAGNOSIS — I10 Essential (primary) hypertension: Secondary | ICD-10-CM | POA: Insufficient documentation

## 2016-07-11 DIAGNOSIS — I2510 Atherosclerotic heart disease of native coronary artery without angina pectoris: Secondary | ICD-10-CM | POA: Insufficient documentation

## 2016-07-11 DIAGNOSIS — R1032 Left lower quadrant pain: Secondary | ICD-10-CM | POA: Diagnosis not present

## 2016-07-11 DIAGNOSIS — N1330 Unspecified hydronephrosis: Secondary | ICD-10-CM | POA: Diagnosis not present

## 2016-07-11 DIAGNOSIS — Z79899 Other long term (current) drug therapy: Secondary | ICD-10-CM | POA: Insufficient documentation

## 2016-07-11 DIAGNOSIS — N132 Hydronephrosis with renal and ureteral calculous obstruction: Secondary | ICD-10-CM | POA: Diagnosis not present

## 2016-07-11 LAB — CBC WITH DIFFERENTIAL/PLATELET
Basophils Absolute: 0 10*3/uL (ref 0.0–0.1)
Basophils Relative: 0 %
Eosinophils Absolute: 0.1 10*3/uL (ref 0.0–0.7)
Eosinophils Relative: 1 %
HCT: 44 % (ref 39.0–52.0)
Hemoglobin: 14.7 g/dL (ref 13.0–17.0)
Lymphocytes Relative: 22 %
Lymphs Abs: 1.5 10*3/uL (ref 0.7–4.0)
MCH: 32.3 pg (ref 26.0–34.0)
MCHC: 33.4 g/dL (ref 30.0–36.0)
MCV: 96.7 fL (ref 78.0–100.0)
Monocytes Absolute: 0.6 10*3/uL (ref 0.1–1.0)
Monocytes Relative: 8 %
Neutro Abs: 4.7 10*3/uL (ref 1.7–7.7)
Neutrophils Relative %: 69 %
Platelets: 308 10*3/uL (ref 150–400)
RBC: 4.55 MIL/uL (ref 4.22–5.81)
RDW: 13.9 % (ref 11.5–15.5)
WBC: 6.8 10*3/uL (ref 4.0–10.5)

## 2016-07-11 LAB — URINALYSIS, ROUTINE W REFLEX MICROSCOPIC
Glucose, UA: NEGATIVE mg/dL
Hgb urine dipstick: NEGATIVE
Ketones, ur: NEGATIVE mg/dL
Leukocytes, UA: NEGATIVE
Nitrite: NEGATIVE
Protein, ur: NEGATIVE mg/dL
Specific Gravity, Urine: 1.028 (ref 1.005–1.030)
pH: 5.5 (ref 5.0–8.0)

## 2016-07-11 LAB — BASIC METABOLIC PANEL
Anion gap: 7 (ref 5–15)
BUN: 23 mg/dL — ABNORMAL HIGH (ref 6–20)
CO2: 26 mmol/L (ref 22–32)
Calcium: 9.4 mg/dL (ref 8.9–10.3)
Chloride: 106 mmol/L (ref 101–111)
Creatinine, Ser: 1 mg/dL (ref 0.61–1.24)
GFR calc Af Amer: >60 mL/min (ref >60)
GFR calc non Af Amer: >60 mL/min (ref >60)
Glucose, Bld: 114 mg/dL — ABNORMAL HIGH (ref 65–99)
Potassium: 4.3 mmol/L (ref 3.5–5.1)
Sodium: 139 mmol/L (ref 135–145)

## 2016-07-11 MED ORDER — ONDANSETRON HCL 4 MG/2ML IJ SOLN
4.0000 mg | Freq: Once | INTRAMUSCULAR | Status: AC
  Administered 2016-07-11: 4 mg via INTRAVENOUS
  Filled 2016-07-11: qty 2

## 2016-07-11 MED ORDER — HYDROMORPHONE HCL 1 MG/ML IJ SOLN
1.0000 mg | Freq: Once | INTRAMUSCULAR | Status: AC
Start: 2016-07-11 — End: 2016-07-11
  Administered 2016-07-11: 1 mg via INTRAVENOUS
  Filled 2016-07-11: qty 1

## 2016-07-11 MED ORDER — ONDANSETRON HCL 4 MG PO TABS: 4.0000 mg | ORAL_TABLET | Freq: Four times a day (QID) | ORAL | 0 refills | Status: DC

## 2016-07-11 MED ORDER — SODIUM CHLORIDE 0.9 % IV BOLUS (SEPSIS)
1000.0000 mL | Freq: Once | INTRAVENOUS | Status: AC
  Administered 2016-07-11: 1000 mL via INTRAVENOUS

## 2016-07-11 MED ORDER — OXYCODONE-ACETAMINOPHEN 5-325 MG PO TABS: 1.0000 | ORAL_TABLET | ORAL | 0 refills | Status: DC | PRN

## 2016-07-11 MED ORDER — KETOROLAC TROMETHAMINE 15 MG/ML IJ SOLN
15.0000 mg | Freq: Once | INTRAMUSCULAR | Status: AC
  Administered 2016-07-11: 15 mg via INTRAVENOUS
  Filled 2016-07-11: qty 1

## 2016-07-11 NOTE — ED Triage Notes (Signed)
Pt from home with complaints of left flank pain. Pt states that pain began around 1500. Pt states he had similar pain about 5 nights ago that woke him up, the pain went away until today. Pt denies any urinary symptoms. Pt states he has had diarrhea today as well. Pt also has complaints of nausea without emesis.

## 2016-07-11 NOTE — ED Notes (Signed)
Pt aware that a urine sample is needed. Pt sts he is unable at this time.  Pt given a urinal and instructed to press call bell for assistance.

## 2016-07-11 NOTE — ED Notes (Signed)
No respiratory or acute distress noted alert and oriented x 3 call light in reach visitor at bedside no reaction to medication noted. 

## 2016-07-11 NOTE — ED Notes (Signed)
Dressing changed with sterile saline non stick bandage and tegaderm dressing tolerated well no respiratory or acute distress noted alert and oriented x 3 left with family.

## 2016-07-11 NOTE — ED Provider Notes (Signed)
[name] Provider Note   CSN: [id number] Arrival date & time: 07/11/16  1720  By signing my name below, I, [name], attest that this documentation has been prepared under the direction and in the presence of Evan Manifold, MD. Electronically Signed: [name], ED Scribe. 07/11/16. 6:02 PM.  History   Chief Complaint Chief Complaint  Patient presents with  . Flank Pain   The history is provided by the patient, the spouse and medical records. No language interpreter was used.   HPI Comments: [name] is a 82 y.o. male with a PMHx of CAD, GERD, STEMI, HTN, HLD, and cardiac catheterization, who presents to the Emergency Department complaining of gradual onset, gradually worsening, constant, stabbing left-sided flank pain onset ~3 hours ago. He reports associated nausea and diaphoresis secondary to his pain. Pt notes that he has also had mild episodes of diarrhea over the past two days. Pt reports an associated episode of similar pain that woke him up from his sleep ~1 week ago. He states that at that time his pain lasted for ~10 minutes, and resolved without any interventions. No hx of renal calculi. No PSHx to the abdomen, but pt notes that he had a L5-S1 Laminectomy w/ hardware placement ~13 years ago. He stills sees an orthopedic specialist for this problem. Denies emesis, dysuria, hematuria, constipation, bloody stools, fever, frequency, urgency, decreased urine volume, or any other associated symptoms.   Past Medical History:  Diagnosis Date  . CAD (coronary artery disease) 12/2013   s/p inferior STEMI 01/08/2014; LHC 01/08/14: total RCA occlusion s/p 3.5x52mm Xience DES distal RCA and 3.5x28 mm DES mid RCA, 60-70% mid LAD stenosis, EF 55%  . Colon polyp   . GERD (gastroesophageal reflux disease)   . Hx of echocardiogram    ETT- Myoview (1/16):  diaph atten, EF 63%, Low Risk  . Hyperlipidemia   . Hypertension   . Migraine with aura   . OSA  (obstructive sleep apnea) 06/19/2015   Moderate OSA with AHI 18/hr  . Osteoarthritis   . Palpitations   . Paroxysmal atrial fibrillation ([place]) 4/16   chads2vasc score is at least 4  . Premature atrial contractions   . RA (rheumatoid arthritis) ([place])   . Sinusitis, chronic 10/17/2015  . STEMI (ST elevation myocardial infarction) [facility])    Patient Active Problem List   Diagnosis Date Noted  . Allergic rhinitis 06/02/2016  . Asthma with exacerbation 06/02/2016  . Sinusitis, chronic 10/17/2015  . OSA (obstructive sleep apnea) 06/19/2015  . Cellulitis and abscess of leg, except foot 05/26/2015  . Paroxysmal atrial fibrillation ([place]) 02/21/2015  . CAP (community acquired pneumonia) 01/02/2015  . Vivid dream 09/25/2014  . Actinic keratoses 04/26/2014  . CAD (coronary artery disease) 02/25/2014  . STEMI (ST elevation myocardial infarction) ([place]) 01/08/2014  . Rheumatoid bursitis of left elbow ([place]) 08/08/2013  . Situational depression 06/18/2013  . Neck pain 04/23/2012  . Acute abdominal pain in left flank 02/14/2012  . Hypertension 09/23/2011  . Chest pain, midsternal 09/13/2011  . Mixed hyperlipidemia 07/23/2010  . Palpitations 07/07/2010  . Rheumatoid arthritis ([place]) 06/04/2010  . PARESTHESIA 06/04/2010  . ARTHRALGIA 11/27/2009  . MYALGIA 08/21/2009  . ALLERGIC RHINITIS 06/11/2008  . OSTEOARTHRITIS 06/11/2008  . FATIGUE 06/11/2008  . ABNORMAL GLUCOSE NEC 02/06/2008  . LOW BACK PAIN 07/27/2007  . COLONIC POLYPS, HX OF 07/27/2007   Past Surgical History:  Procedure Laterality Date  . BACK SURGERY  2004  . LEFT HEART CATHETERIZATION  WITH CORONARY ANGIOGRAM N/A 01/08/2014   Procedure: LEFT HEART CATHETERIZATION WITH CORONARY ANGIOGRAM;  Surgeon: Troy Sine, MD;  Location: [facility] CATH LAB;  Service: Cardiovascular;  Laterality: N/A;  . PERCUTANEOUS CORONARY STENT INTERVENTION (PCI-S)  01/08/2014   Procedure: PERCUTANEOUS CORONARY STENT INTERVENTION (PCI-S);  Surgeon: Troy Sine,  MD;  Location: [facility] CATH LAB;  Service: Cardiovascular;;  . skin cancer removal      Home Medications    Prior to Admission medications   Medication Sig Start Date End Date Taking? Authorizing Provider  Abatacept (ORENCIA [place]) Inject 1 Applicatorful into the skin See admin instructions. Every 4 weeks    Historical Provider, MD  albuterol (PROVENTIL HFA) 108 (90 Base) MCG/ACT inhaler Inhale 2 puffs into the lungs every 6 (six) hours as needed for wheezing or shortness of breath. 05/13/16   Burnard Hawthorne, FNP  apixaban (ELIQUIS) 5 MG TABS tablet Take 5 mg by mouth 2 (two) times daily.    Historical Provider, MD  aspirin 81 MG tablet Take 81 mg by mouth daily.    Historical Provider, MD  benzonatate (TESSALON) 100 MG capsule Take 1 capsule (100 mg total) by mouth 3 (three) times daily as needed for cough. 05/19/16   Burnard Hawthorne, FNP  budesonide-formoterol (SYMBICORT) 160-4.5 MCG/ACT inhaler Inhale 2 puffs into the lungs 2 (two) times daily. 06/01/16   Biagio Borg, MD  Cholecalciferol (VITAMIN D) 1000 UNITS capsule Take 1,000 Units by mouth daily.     Historical Provider, MD  Cyanocobalamin 2500 MCG SUBL Place 1 tablet under the tongue daily.     Historical Provider, MD  DULoxetine (CYMBALTA) 60 MG capsule Take 60 mg by mouth daily.    Historical Provider, MD  ezetimibe (ZETIA) 10 MG tablet Take 5 mg by mouth daily. Take 1/2 tablet by mouth daily.    Historical Provider, MD  lisinopril (PRINIVIL,ZESTRIL) 5 MG tablet TAKE ONE TABLET EACH DAY 06/10/16   Thompson Grayer, MD  LORazepam (ATIVAN) 0.5 MG tablet Take 0.5 mg by mouth every 8 (eight) hours. Take 1-2 tablets by mouth daily as needed for anxiety.    Historical Provider, MD  metoprolol tartrate (LOPRESSOR) 25 MG tablet Take 37.5 mg by mouth 2 (two) times daily. Take 1 and a 1/2 tablets by mouth daily.    Historical Provider, MD  metoprolol tartrate (LOPRESSOR) 25 MG tablet TAKE ONE AND ONE-HALF TABLET TWICE DAILY 05/31/16   Thompson Grayer, MD    nitroGLYCERIN (NITROSTAT) 0.4 MG SL tablet Place 0.4 mg under the tongue every 5 (five) minutes as needed for chest pain (max 3 pills a day). Reported on 05/13/2016    Historical Provider, MD  omeprazole (PRILOSEC) 40 MG capsule Take 40 mg by mouth daily.    Historical Provider, MD  oxyCODONE-acetaminophen (PERCOCET/ROXICET) 5-325 MG tablet TAKE ONE TABLET EVERY 6 HOURS AS NEEDED FOR SEVERE PAIN 12/19/15   Cassandria Anger, MD  predniSONE (DELTASONE) 10 MG tablet 3 tabs by mouth per day for 3 days,2tabs per day for 3 days,1tab per day for 3 days 06/01/16   Biagio Borg, MD  Propylene Glycol (SYSTANE BALANCE OP) Apply 1 drop to eye daily as needed (dry eyeS).     Historical Provider, MD  rosuvastatin (CRESTOR) 10 MG tablet TAKE ONE TABLET EACH DAY 05/21/16   Thompson Grayer, MD  umeclidinium-vilanterol (ANORO ELLIPTA) 62.5-25 MCG/INH AEPB Inhale 1 puff into the lungs daily. 05/07/16   Evie Lacks Plotnikov, MD  zolpidem (AMBIEN) 10 MG tablet Take  1 tablet (10 mg total) by mouth at bedtime as needed. for sleep 02/20/16   Cassandria Anger, MD   Family History Family History  Problem Relation Age of Onset  . Hypertension Mother   . Heart disease Father   . Colon cancer Neg Hx    Social History Social History  Substance Use Topics  . Smoking status: Former Research scientist (life sciences)  . Smokeless tobacco: Never Used     Comment: Quit 50 yrs ago (as of 2015)  . Alcohol use 4.2 - 8.4 oz/week    7 - 14 Glasses of wine per week     Comment: 1-2 glasses of wine nightly   Allergies   Methotrexate; Lisinopril; Atorvastatin; and Pravastatin sodium  Review of Systems Review of Systems  Constitutional: Positive for diaphoresis. Negative for fever.  Gastrointestinal: Positive for diarrhea and nausea. Negative for blood in stool, constipation and vomiting.  Genitourinary: Positive for flank pain (left-sided). Negative for decreased urine volume, dysuria, frequency, hematuria and urgency.  All other systems reviewed and are  negative.  Physical Exam Updated Vital Signs BP 176/97 (BP Location: Left Arm)   Pulse (!) 56   Temp 97.5 F (36.4 C) (Oral)   Resp 18   SpO2 100%   Physical Exam  Constitutional: He appears well-developed and well-nourished. No distress.  HENT:  Head: Normocephalic and atraumatic.  Mouth/Throat: Oropharynx is clear and moist. No oropharyngeal exudate.  Eyes: Conjunctivae and EOM are normal. Pupils are equal, round, and reactive to light. Right eye exhibits no discharge. Left eye exhibits no discharge. No scleral icterus.  Neck: Normal range of motion. Neck supple. No JVD present. No thyromegaly present.  Cardiovascular: Normal rate, regular rhythm, normal heart sounds and intact distal pulses.  Exam reveals no gallop and no friction rub.   No murmur heard. Pulmonary/Chest: Effort normal and breath sounds normal. No respiratory distress. He has no wheezes. He has no rales.  Abdominal: Soft. Bowel sounds are normal. He exhibits no distension and no mass. There is tenderness. There is no CVA tenderness.  Left lower quadrant and left flank pain tenderness.   Musculoskeletal: Normal range of motion. He exhibits no edema or tenderness.  Lymphadenopathy:    He has no cervical adenopathy.  Neurological: He is alert. Coordination normal.  Skin: Skin is warm and dry. No rash noted. No erythema.  Psychiatric: He has a normal mood and affect. His behavior is normal.  Nursing note and vitals reviewed.  ED Treatments / Results  DIAGNOSTIC STUDIES: Oxygen Saturation is 100% on RA, normal by my interpretation.   COORDINATION OF CARE: 6:02 PM-Discussed next steps with pt. Pt verbalized understanding and is agreeable with the plan.   Labs (all labs ordered are listed, but only abnormal results are displayed) Labs Reviewed  URINALYSIS, ROUTINE W REFLEX MICROSCOPIC (NOT AT [facility]) - Abnormal; Notable for the following:       Result Value   Bilirubin Urine SMALL (*)    All other components  within normal limits  BASIC METABOLIC PANEL - Abnormal; Notable for the following:    Glucose, Bld 114 (*)    BUN 23 (*)    All other components within normal limits  CBC WITH DIFFERENTIAL/PLATELET   EKG  EKG Interpretation None      Radiology No results found.   Ct Abdomen Pelvis Wo Contrast  Result Date: 07/11/2016 CLINICAL DATA:  Left flank pain. EXAM: CT ABDOMEN AND PELVIS WITHOUT CONTRAST TECHNIQUE: Multidetector CT imaging of the abdomen and pelvis  was performed following the standard protocol without IV contrast. COMPARISON:  02/10/2012 FINDINGS: Lower chest: Lung bases are clear. Normal heart size. Multi vessel coronary artery atherosclerosis. Hepatobiliary: Normal liver.  Normal gallbladder. Pancreas: Normal. Spleen: Normal. Adrenals/Urinary Tract: Normal adrenal glands. Normal right kidney. Mild left hydronephrosis and dilatation of the left renal pelvis with a normal caliber ureter which may reflect UPJ obstruction. 3 mm nonobstructing left renal calculus. Punctate left renal cortical calcification. Left perinephric stranding. Normal bladder. Stomach/Bowel: No bowel dilatation or bowel wall thickening. No pneumatosis, pneumoperitoneum or portal venous gas. Normal appendix. Diverticulosis without evidence diverticulitis. Vascular/Lymphatic: Normal caliber abdominal aorta with atherosclerosis. No lymphadenopathy. Other: No fluid collection or hematoma. Musculoskeletal: No aggressive osseous lesion. Interbody fusion at L4-5 and L5-S1. Stable Grade 1 anterolisthesis of L4 on L5. Severe degenerative disc disease with disc height loss and facet arthropathy at L2-3. Mild osteoarthritis of bilateral sacroiliac joints. IMPRESSION: 1. Mild left hydronephrosis and dilatation of the left renal pelvis with a normal caliber ureter which may reflect UPJ obstruction. 3 mm nonobstructing left renal calculus. Punctate left renal cortical calcification. Left perinephric stranding. 2. Normal appendix. 3.  Diverticulosis without evidence of diverticulitis. 4.  Aortic Atherosclerosis (ICD10-170.0) Electronically Signed   By: Kathreen Devoid   On: 07/11/2016 19:15    Procedures Procedures (including critical care time)  Medications Ordered in ED Medications - No data to display  Initial Impression / Assessment and Plan / ED Course  I have reviewed the triage vital signs and the nursing notes.  Pertinent labs & imaging results that were available during my care of the patient were reviewed by me and considered in my medical decision making (see chart for details).  Clinical Course    82yM with flank pain. CT with hydronephrosis without stone or other obvious etiology of obstruction. Symptoms now improved. Urinalysis is pretty unremarkable. Discussed case with urology. Symptomatic treatment. Urology follow-up as an outpatient otherwise. Return precautions discussed.  Final Clinical Impressions(s) / ED Diagnoses   Final diagnoses:  Flank pain  Hydronephrosis, unspecified hydronephrosis type    New Prescriptions New Prescriptions   No medications on file    I personally preformed the services scribed in my presence. The recorded information has been reviewed is accurate. Evan Manifold, MD.     Evan Manifold, MD 07/25/16 1022

## 2016-07-12 ENCOUNTER — Ambulatory Visit (HOSPITAL_COMMUNITY)
Admission: RE | Admit: 2016-07-12 | Discharge: 2016-07-12 | Disposition: A | Discharge status: Home / Self Care | Payer: Medicare Other | Source: Ambulatory Visit | Attending: Urology | Admitting: Urology

## 2016-07-12 ENCOUNTER — Inpatient Hospital Stay (HOSPITAL_COMMUNITY): Payer: Medicare Other | Admitting: Certified Registered"

## 2016-07-12 ENCOUNTER — Other Ambulatory Visit: Payer: Self-pay | Admitting: Urology

## 2016-07-12 ENCOUNTER — Encounter (HOSPITAL_COMMUNITY)
Admission: RE | Disposition: A | Discharge status: Home / Self Care | Payer: Self-pay | Source: Ambulatory Visit | Attending: Urology

## 2016-07-12 ENCOUNTER — Encounter (HOSPITAL_COMMUNITY): Payer: Self-pay | Admitting: *Deleted

## 2016-07-12 DIAGNOSIS — Z87891 Personal history of nicotine dependence: Secondary | ICD-10-CM | POA: Diagnosis not present

## 2016-07-12 DIAGNOSIS — Q6211 Congenital occlusion of ureteropelvic junction: Secondary | ICD-10-CM | POA: Diagnosis not present

## 2016-07-12 DIAGNOSIS — I2510 Atherosclerotic heart disease of native coronary artery without angina pectoris: Secondary | ICD-10-CM | POA: Insufficient documentation

## 2016-07-12 DIAGNOSIS — N200 Calculus of kidney: Secondary | ICD-10-CM | POA: Diagnosis not present

## 2016-07-12 DIAGNOSIS — Z7952 Long term (current) use of systemic steroids: Secondary | ICD-10-CM | POA: Diagnosis not present

## 2016-07-12 DIAGNOSIS — N132 Hydronephrosis with renal and ureteral calculous obstruction: Secondary | ICD-10-CM | POA: Insufficient documentation

## 2016-07-12 DIAGNOSIS — I4891 Unspecified atrial fibrillation: Secondary | ICD-10-CM | POA: Insufficient documentation

## 2016-07-12 DIAGNOSIS — E7801 Familial hypercholesterolemia: Secondary | ICD-10-CM | POA: Insufficient documentation

## 2016-07-12 DIAGNOSIS — N1330 Unspecified hydronephrosis: Secondary | ICD-10-CM | POA: Diagnosis not present

## 2016-07-12 DIAGNOSIS — Z7901 Long term (current) use of anticoagulants: Secondary | ICD-10-CM | POA: Diagnosis not present

## 2016-07-12 DIAGNOSIS — Z79899 Other long term (current) drug therapy: Secondary | ICD-10-CM | POA: Insufficient documentation

## 2016-07-12 DIAGNOSIS — Z7982 Long term (current) use of aspirin: Secondary | ICD-10-CM | POA: Insufficient documentation

## 2016-07-12 DIAGNOSIS — J45909 Unspecified asthma, uncomplicated: Secondary | ICD-10-CM | POA: Diagnosis not present

## 2016-07-12 DIAGNOSIS — N130 Hydronephrosis with ureteropelvic junction obstruction: Secondary | ICD-10-CM | POA: Diagnosis not present

## 2016-07-12 DIAGNOSIS — I252 Old myocardial infarction: Secondary | ICD-10-CM | POA: Insufficient documentation

## 2016-07-12 DIAGNOSIS — I10 Essential (primary) hypertension: Secondary | ICD-10-CM | POA: Diagnosis not present

## 2016-07-12 DIAGNOSIS — Z955 Presence of coronary angioplasty implant and graft: Secondary | ICD-10-CM | POA: Diagnosis not present

## 2016-07-12 DIAGNOSIS — G4730 Sleep apnea, unspecified: Secondary | ICD-10-CM | POA: Diagnosis not present

## 2016-07-12 DIAGNOSIS — K219 Gastro-esophageal reflux disease without esophagitis: Secondary | ICD-10-CM | POA: Diagnosis not present

## 2016-07-12 HISTORY — DX: Other complications of anesthesia, initial encounter: T88.59XA

## 2016-07-12 HISTORY — DX: Adverse effect of unspecified anesthetic, initial encounter: T41.45XA

## 2016-07-12 HISTORY — PX: CYSTOSCOPY W/ RETROGRADES: SHX1426

## 2016-07-12 SURGERY — CYSTOSCOPY, WITH RETROGRADE PYELOGRAM
Anesthesia: General | Laterality: Left

## 2016-07-12 MED ORDER — BELLADONNA ALKALOIDS-OPIUM 16.2-60 MG RE SUPP
RECTAL | Status: DC | PRN
  Administered 2016-07-12: 1 via RECTAL

## 2016-07-12 MED ORDER — FENTANYL CITRATE (PF) 100 MCG/2ML IJ SOLN
INTRAMUSCULAR | Status: DC | PRN
  Administered 2016-07-12: 50 ug via INTRAVENOUS

## 2016-07-12 MED ORDER — LIDOCAINE HCL (CARDIAC) 20 MG/ML IV SOLN
INTRAVENOUS | Status: DC | PRN
  Administered 2016-07-12: 30 mg via INTRAVENOUS

## 2016-07-12 MED ORDER — CEFAZOLIN SODIUM-DEXTROSE 2-4 GM/100ML-% IV SOLN
2.0000 g | INTRAVENOUS | Status: AC
  Administered 2016-07-12: 2 g via INTRAVENOUS

## 2016-07-12 MED ORDER — LACTATED RINGERS IV SOLN
INTRAVENOUS | Status: DC | PRN
  Administered 2016-07-12: 19:00:00 via INTRAVENOUS

## 2016-07-12 MED ORDER — EPHEDRINE SULFATE 50 MG/ML IJ SOLN
INTRAMUSCULAR | Status: DC | PRN
  Administered 2016-07-12 (×2): 10 mg via INTRAVENOUS

## 2016-07-12 MED ORDER — IOHEXOL 300 MG/ML  SOLN
INTRAMUSCULAR | Status: DC | PRN
  Administered 2016-07-12: 20 mL via URETHRAL

## 2016-07-12 MED ORDER — BELLADONNA ALKALOIDS-OPIUM 16.2-60 MG RE SUPP
RECTAL | Status: AC
  Filled 2016-07-12: qty 1

## 2016-07-12 MED ORDER — HYDROCODONE-ACETAMINOPHEN 5-325 MG PO TABS: 1.0000 | ORAL_TABLET | Freq: Four times a day (QID) | ORAL | 0 refills | Status: DC | PRN

## 2016-07-12 MED ORDER — EPHEDRINE SULFATE 50 MG/ML IJ SOLN
INTRAMUSCULAR | Status: AC
  Filled 2016-07-12: qty 1

## 2016-07-12 MED ORDER — SODIUM CHLORIDE 0.9 % IR SOLN
Status: DC | PRN
  Administered 2016-07-12: 3000 mL

## 2016-07-12 MED ORDER — PROPOFOL 10 MG/ML IV BOLUS
INTRAVENOUS | Status: AC
  Filled 2016-07-12: qty 20

## 2016-07-12 MED ORDER — SODIUM CHLORIDE 0.9 % IJ SOLN
INTRAMUSCULAR | Status: AC
  Filled 2016-07-12: qty 10

## 2016-07-12 MED ORDER — CEFAZOLIN SODIUM-DEXTROSE 2-4 GM/100ML-% IV SOLN
INTRAVENOUS | Status: AC
  Filled 2016-07-12: qty 100

## 2016-07-12 MED ORDER — ONDANSETRON HCL 4 MG/2ML IJ SOLN
INTRAMUSCULAR | Status: DC | PRN
  Administered 2016-07-12: 4 mg via INTRAVENOUS

## 2016-07-12 MED ORDER — FENTANYL CITRATE (PF) 100 MCG/2ML IJ SOLN: 25.0000 ug | INTRAMUSCULAR | Status: DC | PRN

## 2016-07-12 MED ORDER — FENTANYL CITRATE (PF) 100 MCG/2ML IJ SOLN
INTRAMUSCULAR | Status: AC
  Filled 2016-07-12: qty 2

## 2016-07-12 MED ORDER — MEPERIDINE HCL 50 MG/ML IJ SOLN: 6.2500 mg | INTRAMUSCULAR | Status: DC | PRN

## 2016-07-12 MED ORDER — ONDANSETRON HCL 4 MG/2ML IJ SOLN
INTRAMUSCULAR | Status: AC
  Filled 2016-07-12: qty 2

## 2016-07-12 MED ORDER — PROPOFOL 10 MG/ML IV BOLUS
INTRAVENOUS | Status: DC | PRN
  Administered 2016-07-12: 150 mg via INTRAVENOUS

## 2016-07-12 SURGICAL SUPPLY — 22 items
BAG URO CATCHER STRL LF (MISCELLANEOUS) ×2 IMPLANT
BASKET LASER NITINOL 1.9FR (BASKET) IMPLANT
BASKET STONE NCOMPASS (UROLOGICAL SUPPLIES) IMPLANT
BSKT STON RTRVL 120 1.9FR (BASKET)
CATH URET 5FR 28IN OPEN ENDED (CATHETERS) ×1 IMPLANT
CATH URET DUAL LUMEN 6-10FR 50 (CATHETERS) ×1 IMPLANT
CLOTH BEACON ORANGE TIMEOUT ST (SAFETY) ×2 IMPLANT
FIBER LASER FLEXIVA 1000 (UROLOGICAL SUPPLIES) IMPLANT
FIBER LASER FLEXIVA 365 (UROLOGICAL SUPPLIES) IMPLANT
FIBER LASER FLEXIVA 550 (UROLOGICAL SUPPLIES) IMPLANT
GLOVE SURG SS PI 8.0 STRL IVOR (GLOVE) ×3 IMPLANT
GOWN STRL REUS W/TWL XL LVL3 (GOWN DISPOSABLE) ×3 IMPLANT
GUIDEWIRE STR DUAL SENSOR (WIRE) ×2 IMPLANT
IV NS 1000ML (IV SOLUTION) ×2
IV NS 1000ML BAXH (IV SOLUTION) ×1 IMPLANT
IV NS IRRIG 3000ML ARTHROMATIC (IV SOLUTION) ×1 IMPLANT
MANIFOLD NEPTUNE II (INSTRUMENTS) ×2 IMPLANT
PACK CYSTO (CUSTOM PROCEDURE TRAY) ×2 IMPLANT
SHEATH ACCESS URETERAL 38CM (SHEATH) ×2 IMPLANT
SHEATH URET ACCESS 10/12FR (MISCELLANEOUS) IMPLANT
STENT CONTOUR 6FRX26X.038 (STENTS) ×1 IMPLANT
TUBING CONNECTING 10 (TUBING) ×2 IMPLANT

## 2016-07-12 NOTE — Progress Notes (Signed)
Pt ready for DC to home from PACU post operatively, pt alert and oriented x 4, MAE x 4, pain under control, VSS, no c/o nausea, vomiting. Pt able to ambulate to rest room with min assistance, gait very steady, pt able to dress self w/o assistance. Wife with pt at bedside in PACU. DC instructions reviewed with pt and wife. Teaching done re: (1) Rx for pain, being safe and appropriately taking medicine (2) importance making follow up MD appoint per DC orders from surgeon (3) reviewed s/s of infection (4) times to call MD, ie. Pain not relieved, unable to void, bloody urine (5) reviewed post op diet and other instructions per anesthesia. (6) importance of handwashing. Opportunity for questions provided multiple times during teaching, Teach Back Method used. Provided copy of DC instructions from Surgeon and Anesthesia Team. Pt escorted along with wife to exit, pt in wheelchair, assisted pt to car, prior to leaving ensured both pt or wife did not have further questions .

## 2016-07-12 NOTE — H&P (Signed)
CC: I have hydronephrosis.  HPI: [name] is a 82 year-old male patient who was referred by Dr. Evie Lacks. Plotnikov, MD who is here for hydronephrosis.  His problem was diagnosed 07/11/2016. The problem is on the left side. He had the following x-rays done: CT Scan.   He is currently having flank pain. He denies having back pain, groin pain, nausea, vomiting, fever, and chills.   [name] is a former patient who saw [name] in 2013 for a stone. He had the onset yesterday of left flank pain. The pain was severe with nausea and dry heaves and sweats. He has had no hematuria. He had no frequency or urgency. He had a CT that showed a 63mm left renal stone and mild hydro to the UPJ.      ALLERGIES: Methotrexate - tachycardia Statins     MEDICATIONS: Aspirin 81 mg tablet, chewable 1 tablet PO Daily  Crestor 1 PO Daily  Metoprolol Tartrate 1 PO Daily  Ambien TABS Oral  AmLODIPine Besylate 5 MG Oral Tablet Oral  Cymbalta 60 mg capsule,delayed release 1 capsule PO Daily  Eliquis 5 mg tablet 1 tablet PO Daily  Fish Oil CAPS Oral  Lipitor TABS Oral  LORazepam 0.5 MG Oral Tablet Oral  Omeprazole 20 MG Oral Tablet Delayed Release Oral  Orencia 1 PO Daily  Prednisone 1 PO Daily  TraMADol HCl - 50 MG Oral Tablet Oral  Vitamin B-12 TABS Oral  Zetia 10 mg tablet 1 tablet PO Daily     GU PSH: None     PSH Notes: Hand Surgery, Back Surgery, hand, mohs for squamous cell skin cancer left leg   NON-GU PSH: None   GU PMH: Kidney Stone, Kidney stone on left side - 2014      PMH Notes:  1898-11-15 00:00:00 - Note: Normal Routine History And Physical Senior Citizen [phone]  2012-02-23 09:24:59 - Note: Sinus Arrhythmia  2012-02-23 09:24:59 - Note: Arthritis squamous cell skin cancer   NON-GU PMH: Personal history of other diseases of the circulatory system, History of hypertension - 2014 Personal history of other diseases of the nervous system and sense organs, History of sleep apnea -  2014 Personal history of other endocrine, nutritional and metabolic disease, History of hypercholesterolemia - 2014 Personal history of other specified conditions, History of heartburn - 2014 Unspecified atrial fibrillation, Atrial Fibrillation - 2014 Essential (primary) hypertension Familial hypercholesterolemia Sleep apnea, unspecified ST elevation (STEMI) myocardial infarction involving other sites Unspecified osteoarthritis, unspecified site    FAMILY HISTORY: Family Health Status Number - Runs In Family Father Deceased At [place] ___ - Runs In Family Heart Disease - Father Mother Deceased At Age 52 from diabetic complicati - Runs In Family   SOCIAL HISTORY: Marital Status: Married Current Smoking Status: Patient does not smoke anymore.  Does drink.  Does not drink caffeine. Patient's occupation is/was retired.     Notes: Tobacco use, Marital History - Currently Married, Caffeine Use, Alcohol Use, 1 son 1 daughter   REVIEW OF SYSTEMS:    GU Review Male:   Patient reports erection problems. Patient denies frequent urination, hard to postpone urination, burning/ pain with urination, get up at night to urinate, leakage of urine, stream starts and stops, trouble starting your stream, have to strain to urinate , and penile pain.  Gastrointestinal (Upper):   Patient reports nausea and vomiting. Patient denies indigestion/ heartburn.  Gastrointestinal (Lower):   Patient reports diarrhea. Patient denies constipation.  Constitutional:   Patient reports night  sweats. Patient denies fever, fatigue, and weight loss.  Skin:   Patient denies skin rash/ lesion and itching.  Eyes:   Patient denies blurred vision and double vision.  Ears/ Nose/ Throat:   Patient reports sinus problems. Patient denies sore throat.  Hematologic/Lymphatic:   Patient reports easy bruising. Patient denies swollen glands.  Cardiovascular:   Patient denies leg swelling and chest pains.  Respiratory:   Patient reports  cough. Patient denies shortness of breath.  Endocrine:   Patient denies excessive thirst.  Musculoskeletal:   Patient reports back pain and joint pain.   Neurological:   Patient reports headaches. Patient denies dizziness.  Psychologic:   Patient denies depression and anxiety.   VITAL SIGNS:      07/12/2016 03:32 PM  BP 134/80 mmHg  Pulse 60 /min  Temperature 97.8 F / 37 C   MULTI-SYSTEM PHYSICAL EXAMINATION:    Constitutional: Well-nourished. No physical deformities. Normally developed. Good grooming.  Neck: Neck symmetrical, not swollen. Normal tracheal position.  Respiratory: No labored breathing, no use of accessory muscles. CTA  Cardiovascular: Normal temperature, IRR without murmur.   Lymphatic: No enlargement of neck, axillae, groin.  Skin: No paleness, no jaundice, no cyanosis. No lesion, no ulcer, no rash.  Neurologic / Psychiatric: Oriented to time, oriented to place, oriented to person. No depression, no anxiety, no agitation.  Gastrointestinal: No mass, mild left flank tenderness, no rigidity, mildly obese abdomen.   Musculoskeletal: Normal gait and station of head and neck.     PAST DATA REVIEWED:  Source Of History:  Patient  Lab Test Review:   BMP, CBC  Records Review:   Previous Hospital Records, Previous Patient Records  Urine Test Review:   Urinalysis  X-Ray Review: [facility] Protocol: Reviewed Films. Reviewed Report. left UPJ obstruction. Prior film in 2013 showed only minimal hydro. He has 2 left renal stones.    Notes:                     Hospital labs reviewed and he had a Cr of 1.   PROCEDURES:          Urinalysis - 81003 Dipstick Dipstick Cont'd  Specimen: Voided Bilirubin: Neg  Color: Yellow Ketones: Neg  Appearance: Clear Blood: Neg  Specific Gravity: 1.010 Protein: Neg  pH: 5.5 Urobilinogen: 0.2  Glucose: Neg Nitrites: Neg    Leukocyte Esterase: Neg    ASSESSMENT:      ICD-10 Details  1 GU:   Hydronephrosis Unspec - N13.30 He had moderate  left hydro on his recent CT and it appears to be secondary to a UPJ obstruction.  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 They are minimally changed since 2013.    PLAN:           Schedule Return Visit: ASAP - Schedule Surgery          Document Letter(s):  Created for Patient: Clinical Summary         Notes:   He is symptomatic and requiring oxycodone.   I discussed the physiology of the UPJ obstruction and the indication for cystoscopy and left stenting tonight with subsequent repair of the UPJ obstruction at a later date if the diagnosis is confirmed.   I reviewed the risks of the procedure  including bleeding, infection, ureteral injury, need for a perc or secondary procedures, stent irritation, thrombotic events and anesthetic complications.   CC: Dr. Cristie Hem Todd.

## 2016-07-12 NOTE — Anesthesia Postprocedure Evaluation (Signed)
Anesthesia Post Note  Patient: [name] [facility]  Procedure(s) Performed: Procedure(s) (LRB): CYSTOSCOPY WITH RETROGRADE PYELOGRAM LEFT URETERAL STENT (Left)  Patient location during evaluation: PACU Anesthesia Type: General Level of consciousness: awake and alert Pain management: pain level controlled Vital Signs Assessment: post-procedure vital signs reviewed and stable Respiratory status: spontaneous breathing, nonlabored ventilation, respiratory function stable and patient connected to face mask oxygen Cardiovascular status: blood pressure returned to baseline and stable Postop Assessment: no signs of nausea or vomiting Anesthetic complications: no    Last Vitals:  Vitals:   07/12/16 1648 07/12/16 2035  BP: (!) 149/87 (!) 162/83  Pulse: (!) 59 74  Resp: 18 12  Temp: 36.4 C 36.5 C    Last Pain:  Vitals:   07/12/16 1749  TempSrc:   PainSc: 4                  [name] A

## 2016-07-12 NOTE — Discharge Instructions (Signed)
CYSTOSCOPY HOME CARE INSTRUCTIONS  Activity: Rest for the remainder of the day.  Do not drive or operate equipment today.  You may resume normal activities in one to two days as instructed by your physician.   Meals: Drink plenty of liquids and eat light foods such as gelatin or soup this evening.  You may return to a normal meal plan tomorrow.  Return to Work: You may return to work in one to two days or as instructed by your physician.  Special Instructions / Symptoms: Call your physician if any of these symptoms occur:   -persistent or heavy bleeding  -bleeding which continues after first few urination  -large blood clots that are difficult to pass  -urine stream diminishes or stops completely  -fever equal to or higher than 101 degrees Farenheit.  -cloudy urine with a strong, foul odor  -severe pain  Females should always wipe from front to back after elimination.  You may feel some burning pain when you urinate.  This should disappear with time.  Applying moist heat to the lower abdomen or a hot tub bath may help relieve the pain. \  I will arrange follow up with one of our robotic surgeons.    Patient Signature:  ________________________________________________________  Nurse's Signature:  ________________________________________________________

## 2016-07-12 NOTE — Anesthesia Procedure Notes (Signed)
Procedure Name: LMA Insertion Date/Time: 07/12/2016 8:00 PM Performed by: Lajuana Carry E Pre-anesthesia Checklist: Patient identified, Emergency Drugs available, Suction available and Patient being monitored Patient Re-evaluated:Patient Re-evaluated prior to inductionOxygen Delivery Method: Circle system utilized Preoxygenation: Pre-oxygenation with 100% oxygen Intubation Type: IV induction Ventilation: Mask ventilation without difficulty LMA: LMA inserted LMA Size: 5.0 Number of attempts: 1 Placement Confirmation: positive ETCO2 and breath sounds checked- equal and bilateral Dental Injury: Teeth and Oropharynx as per pre-operative assessment

## 2016-07-12 NOTE — Brief Op Note (Signed)
07/12/2016  8:23 PM  PATIENT:  [name]  82 y.o. male  PRE-OPERATIVE DIAGNOSIS:  LEFT HYDRONEPHROSIS  POST-OPERATIVE DIAGNOSIS:  left UPJ obstruction  PROCEDURE:  Procedure(s): CYSTOSCOPY WITH RETROGRADE PYELOGRAM LEFT URETERAL STENT (Left)  SURGEON:  Surgeon(s) and Role:    * Irine Seal, MD - Primary  PHYSICIAN ASSISTANT:   ASSISTANTS: none   ANESTHESIA:   general  EBL:  No intake/output data recorded.  BLOOD ADMINISTERED:none  DRAINS: 6 x 26 left JJ stent   LOCAL MEDICATIONS USED:  NONE  SPECIMEN:  No Specimen  DISPOSITION OF SPECIMEN:  N/A  COUNTS:  YES  TOURNIQUET:  * No tourniquets in log *  DICTATION: .Other Dictation: Dictation Number [id number]  PLAN OF CARE: Discharge to home after PACU  PATIENT DISPOSITION:  PACU - hemodynamically stable.   Delay start of Pharmacological VTE agent (>24hrs) due to surgical blood loss or risk of bleeding: not applicable

## 2016-07-12 NOTE — Transfer of Care (Signed)
Immediate Anesthesia Transfer of Care Note  Patient: [name] [facility]  Procedure(s) Performed: Procedure(s): CYSTOSCOPY WITH RETROGRADE PYELOGRAM LEFT URETERAL STENT (Left)  Patient Location: PACU  Anesthesia Type:General  Level of Consciousness:  sedated, patient cooperative and responds to stimulation  Airway & Oxygen Therapy:Patient Spontanous Breathing and Patient connected to face mask oxgen  Post-op Assessment:  Report given to PACU RN and Post -op Vital signs reviewed and stable  Post vital signs:  Reviewed and stable  Last Vitals:  Vitals:   07/12/16 1648 07/12/16 2035  BP: (!) 149/87 (!) 162/83  Pulse: (!) 59 74  Resp: 18 12  Temp: [id number] C     Complications: No apparent anesthesia complications

## 2016-07-12 NOTE — Anesthesia Preprocedure Evaluation (Signed)
Anesthesia Evaluation  Patient identified by MRN, date of birth, ID band Patient awake    Reviewed: Allergy & Precautions, NPO status , Patient's Chart, lab work & pertinent test results  Airway Mallampati: I  TM Distance: >3 FB Neck ROM: Full    Dental  (+) Teeth Intact, Dental Advisory Given   Pulmonary asthma , sleep apnea and Continuous Positive Airway Pressure Ventilation , former smoker,    breath sounds clear to auscultation       Cardiovascular hypertension, Pt. on home beta blockers and Pt. on medications + CAD, + Past MI and + Cardiac Stents   Rhythm:Regular Rate:Normal     Neuro/Psych    GI/Hepatic GERD  Medicated and Controlled,  Endo/Other    Renal/GU      Musculoskeletal   Abdominal   Peds  Hematology   Anesthesia Other Findings   Reproductive/Obstetrics                             Anesthesia Physical Anesthesia Plan  ASA: III  Anesthesia Plan: General   Post-op Pain Management:    Induction: Intravenous  Airway Management Planned: LMA  Additional Equipment:   Intra-op Plan:   Post-operative Plan: Extubation in OR  Informed Consent: I have reviewed the patients History and Physical, chart, labs and discussed the procedure including the risks, benefits and alternatives for the proposed anesthesia with the patient or authorized representative who has indicated his/her understanding and acceptance.   Dental advisory given  Plan Discussed with: CRNA, Anesthesiologist and Surgeon  Anesthesia Plan Comments: (No solid food for 6.5 hours. Water 5 hours ago.)        Anesthesia Quick Evaluation

## 2016-07-13 ENCOUNTER — Telehealth: Payer: Self-pay | Admitting: *Deleted

## 2016-07-13 ENCOUNTER — Encounter (HOSPITAL_COMMUNITY): Payer: Self-pay | Admitting: Urology

## 2016-07-13 NOTE — Telephone Encounter (Signed)
Pt was on the [facility] list admitted for hydronephrosis. He had the following x-rays done: CT Scan. Pt was set-up for surgery. D/C 8/28, and will f/u with urologist.../lmb

## 2016-07-13 NOTE — Op Note (Signed)
[name], Evan NO.:  [id number]  MEDICAL RECORD NO.:  [id number]  LOCATION:  [facility]                         FACILITY:  [facility]  PHYSICIAN:  Naguan Mazon. Jeffie Pollock, M.D.    DATE OF BIRTH:  Jun 20, 1933  DATE OF PROCEDURE:  07/12/2016 DATE OF DISCHARGE:  07/12/2016                              OPERATIVE REPORT   PROCEDURE:  Cystoscopy with left retrograde pyelogram and interpretation, insertion of left double-J stent.  PREOPERATIVE DIAGNOSIS:  Left hydronephrosis with probable ureteropelvic junction obstruction.  POSTOPERATIVE DIAGNOSIS:  Left hydronephrosis with ureteropelvic junction obstruction.  SURGEON:  Jaking Brasington. Jeffie Pollock, M.D.  ANESTHESIA:  General.  SPECIMEN:  None.  DRAINS:  A 6-French x 26-cm Contour left double-J stent.  BLOOD LOSS:  None.  COMPLICATIONS:  None.  INDICATIONS:  [name] is an 82 year old white male, who had previously seen Dr. Janice Norrie in 2013, with left flank pain.  He was found to have renal stones at that time, but it was not apparent that the stone had caused his pain, which abated.  He returns now with history of left flank pain and a CT scan in the emergency room demonstrated moderate-to-severe left hydronephrosis with probable UPJ obstruction.  There were stones in the kidney, but none that were contributing to the obstruction.  Because of his persistent pain complaints, it was felt that stenting was indicated.  FINDINGS AND PROCEDURE:  He was given Ancef.  He was taken to the operating room where general anesthetic was induced.  He was placed in lithotomy position and fitted with PAS hose.  His perineum and genitalia were prepped with Betadine solution, he was draped in usual sterile fashion.  Cystoscopy was performed using the 23-French scope and 30-degree lens. Examination revealed the normal urethra.  The external sphincter was intact.  The prostatic urethra had bilobar hyperplasia, it was approximately 3 cm in length.  There  were some obstructions. Examination of the bladder revealed moderate trabeculation with cellules and small diverticula.  Ureteral orifices were in their normal anatomic position.  The left ureteral orifice was cannulated with 5-French open-ended catheter and contrast was instilled.  This revealed a normal ureter up to the UPJ where there was a narrowing and an indentation that appeared most consistent with a crossing vessel.  The renal pelvis and internal collecting system were dilated; however, was unable to fill it completely.  At this point, a guidewire was passed to the kidney without difficulty and a 6-French 26-cm double-J stent was then inserted over the wire under fluoroscopic guidance.  The wire was removed leaving good coil in the kidney and good coil in the bladder.  There was brisk efflux from the stent loop distally.  The bladder was drained.  The cystoscope was removed.  B and O suppository was placed.  He was taken down from lithotomy position.  His anesthetic was reversed.  He was moved to the recovery room in stable condition.  There were no complications.     Marshall Cork. Jeffie Pollock, M.D.     JJW/MEDQ  D:  07/12/2016  T:  07/13/2016  Job:  [id number]

## 2016-07-20 DIAGNOSIS — N200 Calculus of kidney: Secondary | ICD-10-CM | POA: Diagnosis not present

## 2016-07-20 DIAGNOSIS — N1330 Unspecified hydronephrosis: Secondary | ICD-10-CM | POA: Diagnosis not present

## 2016-07-20 DIAGNOSIS — Q6211 Congenital occlusion of ureteropelvic junction: Secondary | ICD-10-CM | POA: Diagnosis not present

## 2016-07-22 DIAGNOSIS — M0609 Rheumatoid arthritis without rheumatoid factor, multiple sites: Secondary | ICD-10-CM | POA: Diagnosis not present

## 2016-07-23 ENCOUNTER — Telehealth: Payer: Self-pay | Admitting: Internal Medicine

## 2016-07-23 NOTE — Telephone Encounter (Signed)
I spoke with patient and his wife and let her know that I would discuss with Dr Rayann Heman on Monday and call them back once approved and sent back

## 2016-07-23 NOTE — Telephone Encounter (Signed)
New Message ° °Pt call requesting to speak with RN. Pt did not want to disclose any further information. Please call back to discuss  °

## 2016-07-26 NOTE — Telephone Encounter (Signed)
Called and let patient and his wife know that the clearance had been sent today

## 2016-07-27 ENCOUNTER — Other Ambulatory Visit: Payer: Self-pay | Admitting: Urology

## 2016-07-27 ENCOUNTER — Telehealth: Payer: Self-pay | Admitting: Cardiology

## 2016-07-27 ENCOUNTER — Telehealth: Payer: Self-pay | Admitting: *Deleted

## 2016-07-27 NOTE — Telephone Encounter (Signed)
OK to fill this prescription with additional refills x1 Thank you!  

## 2016-07-27 NOTE — Telephone Encounter (Signed)
Rec'd fax pt requesting refill on Ambien 10 mg #90. Last filled 02/20/16.,./lmb

## 2016-07-27 NOTE — Telephone Encounter (Signed)
Patient was actually trying to get in touch with Evan Halter, RN to thank her for all her help in getting things over to his Urologist.  He said that there had been some issues that had caused delays, and she handled things quickly and efficiently. He said there was no need for a call back, he just wanted to extend his appreciation to her for all her help.

## 2016-07-27 NOTE — Telephone Encounter (Signed)
Follow up    Pt call requesting to speak with RN. Pt did not want to disclose any further information. Please call back to discuss

## 2016-07-28 ENCOUNTER — Telehealth: Payer: Self-pay | Admitting: *Deleted

## 2016-07-28 MED ORDER — ZOLPIDEM TARTRATE 10 MG PO TABS: 10.0000 mg | ORAL_TABLET | Freq: Every evening | ORAL | 1 refills | Status: DC | PRN

## 2016-07-28 NOTE — Telephone Encounter (Signed)
Rec'd fax pt requesting refill on Lorazepam 0.5mg . Last filled 03/10/16....[name]

## 2016-07-28 NOTE — Telephone Encounter (Signed)
Called refill into pharmacy spoke w/David gave MD approval. Updated epic...Johny Chess

## 2016-07-28 NOTE — Telephone Encounter (Signed)
OK to fill this prescription with additional refills x3 Thank you!  

## 2016-07-29 MED ORDER — LORAZEPAM 0.5 MG PO TABS: 0.5000 mg | ORAL_TABLET | Freq: Two times a day (BID) | ORAL | 3 refills | Status: DC

## 2016-07-29 NOTE — Telephone Encounter (Signed)
Updated epic... Refill called into pharmacy spoke w/David gave MD response...Johny Chess

## 2016-08-06 DIAGNOSIS — L820 Inflamed seborrheic keratosis: Secondary | ICD-10-CM | POA: Diagnosis not present

## 2016-08-06 DIAGNOSIS — D0472 Carcinoma in situ of skin of left lower limb, including hip: Secondary | ICD-10-CM | POA: Diagnosis not present

## 2016-08-06 DIAGNOSIS — Z85828 Personal history of other malignant neoplasm of skin: Secondary | ICD-10-CM | POA: Diagnosis not present

## 2016-08-06 DIAGNOSIS — L812 Freckles: Secondary | ICD-10-CM | POA: Diagnosis not present

## 2016-08-06 DIAGNOSIS — D0462 Carcinoma in situ of skin of left upper limb, including shoulder: Secondary | ICD-10-CM | POA: Diagnosis not present

## 2016-08-06 DIAGNOSIS — L821 Other seborrheic keratosis: Secondary | ICD-10-CM | POA: Diagnosis not present

## 2016-08-06 DIAGNOSIS — D045 Carcinoma in situ of skin of trunk: Secondary | ICD-10-CM | POA: Diagnosis not present

## 2016-08-06 DIAGNOSIS — L570 Actinic keratosis: Secondary | ICD-10-CM | POA: Diagnosis not present

## 2016-08-06 DIAGNOSIS — D485 Neoplasm of uncertain behavior of skin: Secondary | ICD-10-CM | POA: Diagnosis not present

## 2016-08-09 ENCOUNTER — Encounter (HOSPITAL_BASED_OUTPATIENT_CLINIC_OR_DEPARTMENT_OTHER): Payer: Self-pay | Admitting: *Deleted

## 2016-08-09 NOTE — Progress Notes (Signed)
NPO AFTER MN.  ARRIVE AT 0945.  NEEDS ISTAT 8.  CURRENT EKG IN CHART AND EPIC.  WILL TAKE METOPROLOL, PRILOSEC, CYMBALTA, AND PREDNISONE AM DOS W/ SIPS OF WATER.

## 2016-08-11 ENCOUNTER — Ambulatory Visit (HOSPITAL_BASED_OUTPATIENT_CLINIC_OR_DEPARTMENT_OTHER): Payer: Medicare Other | Admitting: Anesthesiology

## 2016-08-11 ENCOUNTER — Ambulatory Visit (HOSPITAL_BASED_OUTPATIENT_CLINIC_OR_DEPARTMENT_OTHER)
Admission: RE | Admit: 2016-08-11 | Discharge: 2016-08-11 | Disposition: A | Discharge status: Home / Self Care | Payer: Medicare Other | Source: Ambulatory Visit | Attending: Urology | Admitting: Urology

## 2016-08-11 ENCOUNTER — Encounter (HOSPITAL_BASED_OUTPATIENT_CLINIC_OR_DEPARTMENT_OTHER)
Admission: RE | Disposition: A | Discharge status: Home / Self Care | Payer: Self-pay | Source: Ambulatory Visit | Attending: Urology

## 2016-08-11 ENCOUNTER — Encounter (HOSPITAL_BASED_OUTPATIENT_CLINIC_OR_DEPARTMENT_OTHER): Payer: Self-pay

## 2016-08-11 DIAGNOSIS — Z7901 Long term (current) use of anticoagulants: Secondary | ICD-10-CM | POA: Diagnosis not present

## 2016-08-11 DIAGNOSIS — Z7952 Long term (current) use of systemic steroids: Secondary | ICD-10-CM | POA: Insufficient documentation

## 2016-08-11 DIAGNOSIS — E785 Hyperlipidemia, unspecified: Secondary | ICD-10-CM | POA: Insufficient documentation

## 2016-08-11 DIAGNOSIS — F329 Major depressive disorder, single episode, unspecified: Secondary | ICD-10-CM | POA: Diagnosis not present

## 2016-08-11 DIAGNOSIS — Z87442 Personal history of urinary calculi: Secondary | ICD-10-CM | POA: Diagnosis not present

## 2016-08-11 DIAGNOSIS — N1330 Unspecified hydronephrosis: Secondary | ICD-10-CM | POA: Insufficient documentation

## 2016-08-11 DIAGNOSIS — G4730 Sleep apnea, unspecified: Secondary | ICD-10-CM | POA: Diagnosis not present

## 2016-08-11 DIAGNOSIS — R51 Headache: Secondary | ICD-10-CM | POA: Diagnosis not present

## 2016-08-11 DIAGNOSIS — K219 Gastro-esophageal reflux disease without esophagitis: Secondary | ICD-10-CM | POA: Insufficient documentation

## 2016-08-11 DIAGNOSIS — I2510 Atherosclerotic heart disease of native coronary artery without angina pectoris: Secondary | ICD-10-CM | POA: Diagnosis not present

## 2016-08-11 DIAGNOSIS — I252 Old myocardial infarction: Secondary | ICD-10-CM | POA: Diagnosis not present

## 2016-08-11 DIAGNOSIS — G4733 Obstructive sleep apnea (adult) (pediatric): Secondary | ICD-10-CM | POA: Insufficient documentation

## 2016-08-11 DIAGNOSIS — I10 Essential (primary) hypertension: Secondary | ICD-10-CM | POA: Insufficient documentation

## 2016-08-11 DIAGNOSIS — I480 Paroxysmal atrial fibrillation: Secondary | ICD-10-CM | POA: Insufficient documentation

## 2016-08-11 DIAGNOSIS — Z87891 Personal history of nicotine dependence: Secondary | ICD-10-CM | POA: Diagnosis not present

## 2016-08-11 DIAGNOSIS — J45909 Unspecified asthma, uncomplicated: Secondary | ICD-10-CM | POA: Insufficient documentation

## 2016-08-11 DIAGNOSIS — Z7982 Long term (current) use of aspirin: Secondary | ICD-10-CM | POA: Insufficient documentation

## 2016-08-11 DIAGNOSIS — Z79899 Other long term (current) drug therapy: Secondary | ICD-10-CM | POA: Insufficient documentation

## 2016-08-11 DIAGNOSIS — M069 Rheumatoid arthritis, unspecified: Secondary | ICD-10-CM | POA: Insufficient documentation

## 2016-08-11 DIAGNOSIS — Z955 Presence of coronary angioplasty implant and graft: Secondary | ICD-10-CM | POA: Diagnosis not present

## 2016-08-11 DIAGNOSIS — Q6211 Congenital occlusion of ureteropelvic junction: Secondary | ICD-10-CM | POA: Diagnosis not present

## 2016-08-11 DIAGNOSIS — I129 Hypertensive chronic kidney disease with stage 1 through stage 4 chronic kidney disease, or unspecified chronic kidney disease: Secondary | ICD-10-CM | POA: Diagnosis not present

## 2016-08-11 HISTORY — DX: Personal history of colon polyps, unspecified: Z86.0100

## 2016-08-11 HISTORY — DX: Presence of spectacles and contact lenses: Z97.3

## 2016-08-11 HISTORY — PX: CYSTOSCOPY WITH RETROGRADE PYELOGRAM, URETEROSCOPY AND STENT PLACEMENT: SHX5789

## 2016-08-11 HISTORY — DX: Personal history of colonic polyps: Z86.010

## 2016-08-11 HISTORY — DX: Obstructive sleep apnea (adult) (pediatric): G47.33

## 2016-08-11 HISTORY — DX: Other specified postprocedural states: Z98.890

## 2016-08-11 HISTORY — DX: Dependence on other enabling machines and devices: Z99.89

## 2016-08-11 HISTORY — DX: Personal history of diseases of the skin and subcutaneous tissue: Z87.2

## 2016-08-11 HISTORY — DX: Diverticulosis of large intestine without perforation or abscess without bleeding: K57.30

## 2016-08-11 HISTORY — DX: Presence of external hearing-aid: Z97.4

## 2016-08-11 HISTORY — DX: Unspecified hydronephrosis: N13.30

## 2016-08-11 HISTORY — DX: Other specified postprocedural states: Z85.9

## 2016-08-11 HISTORY — DX: Personal history of urinary calculi: Z87.442

## 2016-08-11 LAB — POCT I-STAT, CHEM 8
BUN: 15 mg/dL (ref 6–20)
Calcium, Ion: 1.24 mmol/L (ref 1.15–1.40)
Chloride: 104 mmol/L (ref 101–111)
Creatinine, Ser: 0.8 mg/dL (ref 0.61–1.24)
Glucose, Bld: 98 mg/dL (ref 65–99)
HCT: 41 % (ref 39.0–52.0)
Hemoglobin: 13.9 g/dL (ref 13.0–17.0)
Potassium: 3.8 mmol/L (ref 3.5–5.1)
Sodium: 144 mmol/L (ref 135–145)
TCO2: 27 mmol/L (ref 0–100)

## 2016-08-11 SURGERY — CYSTOURETEROSCOPY, WITH RETROGRADE PYELOGRAM AND STENT INSERTION
Anesthesia: General | Site: Renal | Laterality: Left

## 2016-08-11 MED ORDER — ONDANSETRON HCL 4 MG/2ML IJ SOLN
INTRAMUSCULAR | Status: AC
  Filled 2016-08-11: qty 2

## 2016-08-11 MED ORDER — LIDOCAINE 2% (20 MG/ML) 5 ML SYRINGE
INTRAMUSCULAR | Status: AC
  Filled 2016-08-11: qty 5

## 2016-08-11 MED ORDER — DEXAMETHASONE SODIUM PHOSPHATE 4 MG/ML IJ SOLN
INTRAMUSCULAR | Status: DC | PRN
  Administered 2016-08-11: 5 mg via INTRAVENOUS

## 2016-08-11 MED ORDER — PROPOFOL 10 MG/ML IV BOLUS
INTRAVENOUS | Status: AC
  Filled 2016-08-11: qty 20

## 2016-08-11 MED ORDER — DEXAMETHASONE SODIUM PHOSPHATE 10 MG/ML IJ SOLN
INTRAMUSCULAR | Status: AC
  Filled 2016-08-11: qty 1

## 2016-08-11 MED ORDER — FENTANYL CITRATE (PF) 100 MCG/2ML IJ SOLN
25.0000 ug | INTRAMUSCULAR | Status: DC | PRN
  Filled 2016-08-11: qty 1

## 2016-08-11 MED ORDER — LACTATED RINGERS IV SOLN
INTRAVENOUS | Status: DC
  Administered 2016-08-11: 10:00:00 via INTRAVENOUS
  Filled 2016-08-11: qty 1000

## 2016-08-11 MED ORDER — SENNOSIDES-DOCUSATE SODIUM 8.6-50 MG PO TABS: 1.0000 | ORAL_TABLET | Freq: Two times a day (BID) | ORAL | 0 refills | Status: DC

## 2016-08-11 MED ORDER — OXYCODONE-ACETAMINOPHEN 5-325 MG PO TABS: 1.0000 | ORAL_TABLET | Freq: Four times a day (QID) | ORAL | 0 refills | Status: DC | PRN

## 2016-08-11 MED ORDER — SODIUM CHLORIDE 0.9 % IR SOLN
Status: DC | PRN
  Administered 2016-08-11: 4000 mL

## 2016-08-11 MED ORDER — GENTAMICIN IN SALINE 1.6-0.9 MG/ML-% IV SOLN
80.0000 mg | INTRAVENOUS | Status: DC
  Filled 2016-08-11: qty 50

## 2016-08-11 MED ORDER — FENTANYL CITRATE (PF) 100 MCG/2ML IJ SOLN
INTRAMUSCULAR | Status: DC | PRN
  Administered 2016-08-11 (×2): 12.5 ug via INTRAVENOUS
  Administered 2016-08-11 (×2): 25 ug via INTRAVENOUS

## 2016-08-11 MED ORDER — LIDOCAINE 2% (20 MG/ML) 5 ML SYRINGE
INTRAMUSCULAR | Status: DC | PRN
  Administered 2016-08-11: 60 mg via INTRAVENOUS

## 2016-08-11 MED ORDER — GENTAMICIN SULFATE 40 MG/ML IJ SOLN
5.0000 mg/kg | INTRAVENOUS | Status: AC
  Administered 2016-08-11: 410 mg via INTRAVENOUS
  Filled 2016-08-11: qty 10.25

## 2016-08-11 MED ORDER — PROPOFOL 10 MG/ML IV BOLUS
INTRAVENOUS | Status: DC | PRN
  Administered 2016-08-11: 150 mg via INTRAVENOUS
  Administered 2016-08-11: 50 mg via INTRAVENOUS

## 2016-08-11 MED ORDER — FENTANYL CITRATE (PF) 100 MCG/2ML IJ SOLN
INTRAMUSCULAR | Status: AC
  Filled 2016-08-11: qty 2

## 2016-08-11 MED ORDER — IOHEXOL 300 MG/ML  SOLN
INTRAMUSCULAR | Status: DC | PRN
  Administered 2016-08-11: 30 mL

## 2016-08-11 MED ORDER — STERILE WATER FOR IRRIGATION IR SOLN
Status: DC | PRN
  Administered 2016-08-11: 500 mL

## 2016-08-11 MED ORDER — EPHEDRINE 5 MG/ML INJ
INTRAVENOUS | Status: AC
  Filled 2016-08-11: qty 10

## 2016-08-11 MED ORDER — EPHEDRINE SULFATE 50 MG/ML IJ SOLN
INTRAMUSCULAR | Status: DC | PRN
  Administered 2016-08-11: 10 mg via INTRAVENOUS

## 2016-08-11 SURGICAL SUPPLY — 48 items
BAG DRAIN URO-CYSTO SKYTR STRL (DRAIN) ×3 IMPLANT
BAG DRN UROCATH (DRAIN) ×2
BASKET DAKOTA 1.9FR 11X120 (BASKET) IMPLANT
BASKET LASER NITINOL 1.9FR (BASKET) IMPLANT
BASKET STNLS GEMINI 4WIRE 3FR (BASKET) IMPLANT
BASKET ZERO TIP NITINOL 2.4FR (BASKET) IMPLANT
BSKT STON RTRVL 120 1.9FR (BASKET)
BSKT STON RTRVL GEM 120X11 3FR (BASKET)
BSKT STON RTRVL ZERO TP 2.4FR (BASKET)
CATH INTERMIT  6FR 70CM (CATHETERS) ×3 IMPLANT
CATH URET 5FR 28IN CONE TIP (BALLOONS)
CATH URET 5FR 28IN OPEN ENDED (CATHETERS) IMPLANT
CATH URET 5FR 70CM CONE TIP (BALLOONS) IMPLANT
CLOTH BEACON ORANGE TIMEOUT ST (SAFETY) ×3 IMPLANT
ELECT REM PT RETURN 9FT ADLT (ELECTROSURGICAL)
ELECTRODE REM PT RTRN 9FT ADLT (ELECTROSURGICAL) IMPLANT
FIBER LASER FLEXIVA 365 (UROLOGICAL SUPPLIES) IMPLANT
FIBER LASER TRAC TIP (UROLOGICAL SUPPLIES) IMPLANT
GLOVE BIO SURGEON STRL SZ 6.5 (GLOVE) ×4 IMPLANT
GLOVE BIO SURGEON STRL SZ7.5 (GLOVE) ×5 IMPLANT
GLOVE BIOGEL PI IND STRL 6.5 (GLOVE) ×1 IMPLANT
GLOVE BIOGEL PI IND STRL 7.5 (GLOVE) ×1 IMPLANT
GLOVE BIOGEL PI INDICATOR 6.5 (GLOVE) ×1
GLOVE BIOGEL PI INDICATOR 7.5 (GLOVE) ×1
GOWN STRL REUS W/ TWL LRG LVL3 (GOWN DISPOSABLE) ×5 IMPLANT
GOWN STRL REUS W/ TWL XL LVL3 (GOWN DISPOSABLE) ×2 IMPLANT
GOWN STRL REUS W/TWL LRG LVL3 (GOWN DISPOSABLE) ×9
GOWN STRL REUS W/TWL XL LVL3 (GOWN DISPOSABLE) ×3
GUIDEWIRE 0.038 PTFE COATED (WIRE) IMPLANT
GUIDEWIRE ANG ZIPWIRE 038X150 (WIRE) ×3 IMPLANT
GUIDEWIRE STR DUAL SENSOR (WIRE) ×3 IMPLANT
IV NS 1000ML (IV SOLUTION) ×3
IV NS 1000ML BAXH (IV SOLUTION) ×2 IMPLANT
IV NS IRRIG 3000ML ARTHROMATIC (IV SOLUTION) ×4 IMPLANT
KIT BALLIN UROMAX 15FX10 (LABEL) IMPLANT
KIT BALLN UROMAX 15FX4 (MISCELLANEOUS) IMPLANT
KIT BALLN UROMAX 26 75X4 (MISCELLANEOUS)
KIT ROOM TURNOVER WOR (KITS) ×3 IMPLANT
MANIFOLD NEPTUNE II (INSTRUMENTS) ×3 IMPLANT
NS IRRIG 500ML POUR BTL (IV SOLUTION) ×3 IMPLANT
PACK CYSTO (CUSTOM PROCEDURE TRAY) ×3 IMPLANT
SET HIGH PRES BAL DIL (LABEL)
SHEATH ACCESS URETERAL 38CM (SHEATH) ×2 IMPLANT
STENT URET 6FRX26 CONTOUR (STENTS) ×2 IMPLANT
SYRINGE 10CC LL (SYRINGE) ×3 IMPLANT
SYRINGE IRR TOOMEY STRL 70CC (SYRINGE) IMPLANT
TUBE CONNECTING 12X1/4 (SUCTIONS) IMPLANT
TUBE FEEDING 8FR 16IN STR KANG (MISCELLANEOUS) ×3 IMPLANT

## 2016-08-11 NOTE — H&P (Signed)
[name] is an 82 y.o. male.    Chief Complaint: Pre-OP Left Ureteroscopy / Possible Endopyelotomy  HPI:   1 - Left UPJ Obstruction - Worsening left hydronephrosis w/o ureteronephrosis by non-contrast CT 2017. This is new / worsened since prior imaging 2015. Cr 1.0. Left kidney with preserved parenchyma. NO h/o left ureteral instrumentation / trauma. No overt crossing vessels on non-contrast imaging. Had 6x26 JJ stent placed 07/12/16 by Dr. Jeffie Pollock during suspect [name]'s crises. Images from that placement c/w UPJO.   2 - Nephrolithiasis - questionable episode of medical passage left sided stone around 2015, though no stone ever seen. CT 2017 with likely left renal parenchymal / vascular calcificaitons that are <64mm lower and mid.   PMH sig for CAD/MI/AFib/Eliquus (follows [facility], presently not limiting), Rheumatorid (pred + biologic, managed by Dr. Amil Amen with Rheum), No prior abdominal or chest surgeries. His PCP is [name] MD>   Today "[name]" is seen to proceed with left ureteroscopy / possible endopyelotomy.    Past Medical History:  Diagnosis Date  . CAD (coronary artery disease) 12/2013--  cardiologist-  dr allred/ dr turner   s/p inferior STEMI 01/08/2014; [place] 01/08/14: total RCA occlusion s/p 3.5x52mm Xience DES distal RCA and 3.5x28 mm DES mid RCA, 60-70% mid LAD stenosis, EF 55%  . Complication of anesthesia    'long to wake up after back surgery " 09/2003  . Diverticulosis of colon   . GERD (gastroesophageal reflux disease)   . History of cellulitis    05-26-2015  LLE  . History of colon polyps    1998- benign/  2008 adenomatous   . History of kidney stones    2013  . History of squamous cell carcinoma excision    2013;  2015;  06-12-2015 right leg/  02/ and 05/ 2017  left ear and left leg  . History of ST elevation myocardial infarction (STEMI) 01/08/2014   acute infertor--  s/p  DES x2 to RCA  . Hydronephrosis, left   . Hyperlipidemia   . Hypertension   .  Migraine with aura   . OSA on CPAP 06/19/2015   Moderate OSA with AHI 18/hr  per study 05-20-2015  . Osteoarthritis   . Palpitations   . Paroxysmal atrial fibrillation ([place]) 4/16   chads2vasc score is at least 4  . Premature atrial contractions   . RA (rheumatoid arthritis) [facility])    rheumatologist-  dr Leigh Aurora  . S/P drug eluting coronary stent placement 01/08/2014   x2  to mid and distal RCA  . Sinusitis, chronic 10/17/2015  . Wears glasses   . Wears hearing aid    bilateral    Past Surgical History:  Procedure Laterality Date  . CARDIOVASCULAR STRESS TEST  11/21/2015   Low risk nuclear study w/ a small diaphragmatic attenuation artifact, no ischemia/  normal LV function and wall motion , ef 63%  . CATARACT EXTRACTION W/ INTRAOCULAR LENS  IMPLANT, BILATERAL  2015  . COLONOSCOPY  last one 06-09-2011  . CYSTOSCOPY W/ RETROGRADES Left 07/12/2016   Procedure: CYSTOSCOPY WITH RETROGRADE PYELOGRAM LEFT URETERAL STENT;  Surgeon: Irine Seal, MD;  Location: [facility];  Service: Urology;  Laterality: Left;  . 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   severe fibromatosis palm and fingers  . LEFT HEART CATHETERIZATION WITH CORONARY ANGIOGRAM N/A 01/08/2014   Procedure: LEFT HEART CATHETERIZATION WITH CORONARY ANGIOGRAM;  Surgeon: Troy Sine, MD;  Location: [facility] CATH LAB;  Service: Cardiovascular;  Laterality:N/A;  total/ subtotal  RCA/  mLAD [id number] w/ mid systolic bridging/  preserved global LVF, ef 55%  . MOHS SURGERY  x2  feb and may 2017   left ear /  left leg  ([facility])  . PERCUTANEOUS CORONARY STENT INTERVENTION (PCI-S)  01/08/2014   Procedure: PERCUTANEOUS CORONARY STENT INTERVENTION (PCI-S);  Surgeon: Troy Sine, MD;  Location: [facility] CATH LAB;  Service: Cardiovascular;;  DES to mid and distal RCA  . POSTERIOR LUMBAR FUSION  10/01/2003   and Laminectomy/ diskectomy  L4 -- S1  . TRANSTHORACIC ECHOCARDIOGRAM  02/23/2016   mild LVH,  ef 50-55%,  grade 1 diastolic dysfunction/  mild to  moderate AV calcification w/ no stenosis or regurg./  trivial MR and TR/  mild PR    Family History  Problem Relation Age of Onset  . Hypertension Mother   . Heart disease Father   . Colon cancer Neg Hx    Social History:  reports that he quit smoking about 48 years ago. His smoking use included Cigarettes. He quit after 10.00 years of use. He has never used smokeless tobacco. He reports that he drinks about 4.2 - 8.4 oz of alcohol per week . He reports that he does not use drugs.  Allergies:  Allergies  Allergen Reactions  . Methotrexate Other (See Comments)    REACTION: tachycardia  . Lisinopril Other (See Comments)    cough  . Atorvastatin Other (See Comments)    Muscle pain, leg cramps  . Pravastatin Sodium Other (See Comments)    REACTION: cramps, fatigue    No prescriptions prior to admission.    No results found for this or any previous visit (from the past 48 hour(s)). No results found.  Review of Systems  Constitutional: Negative.   HENT: Negative.   Eyes: Negative.   Respiratory: Negative.   Cardiovascular: Negative.   Gastrointestinal: Negative.   Genitourinary: Negative.   Musculoskeletal: Negative.   Skin: Negative.   Neurological: Negative.   Endo/Heme/Allergies: Negative.   Psychiatric/Behavioral: Negative.     Height 6' (1.829 m), weight 81.6 kg (180 lb). Physical Exam  Constitutional: He appears well-developed.  HENT:  Head: Normocephalic.  Eyes: Pupils are equal, round, and reactive to light.  Neck: Normal range of motion.  Cardiovascular: Normal rate.   Respiratory: Effort normal.  GI: Soft.  Genitourinary:  Genitourinary Comments: No CVAT at present  Musculoskeletal: Normal range of motion.  Neurological: He is alert.  Skin: Skin is warm.  Psychiatric: He has a normal mood and affect. His behavior is normal. Judgment and thought content normal.     Assessment/Plan  Proceed as planned today. Risks, benefits, alternatives, expected  peri-op course with stent excahnge discussed previously and reiterated today.   Alexis Frock, MD 08/11/2016, 6:59 AM

## 2016-08-11 NOTE — Discharge Instructions (Signed)
[facility] °[phone] °Post Ureteroscopy With or Without Stent Instructions ° °Definitions: ° °Ureter: The duct that transports urine from the kidney to the bladder. °Stent:   A plastic hollow tube that is placed into the ureter, from the kidney to the                 bladder to prevent the ureter from swelling shut. ° °GENERAL INSTRUCTIONS: ° °Despite the fact that no skin incisions were used, the area around the ureter and bladder is raw and irritated. The stent is a foreign body which will further irritate the bladder wall. This irritation is manifested by increased frequency of urination, both day and night, and by an increase in the urge to urinate. In some, the urge to urinate is present almost always. Sometimes the urge is strong enough that you may not be able to stop yourself from urinating. The only real cure is to remove the stent and then give time for the bladder wall to heal which can't be done until the danger of the ureter swelling shut has passed, which varies. ° °You may see some blood in your urine while the stent is in place and a few days afterwards. Do not be alarmed, even if the urine was clear for a while. Get off your feet and drink lots of fluids until clearing occurs. If you start to pass clots or don't improve, call us. ° °DIET: °You may return to your normal diet immediately. Because of the raw surface of your bladder, alcohol, spicy foods, acid type foods and drinks with caffeine may cause irritation or frequency and should be used in moderation. To keep your urine flowing freely and to avoid constipation, drink plenty of fluids during the day ( 8-10 glasses ). °Tip: Avoid cranberry juice because it is very acidic. ° °ACTIVITY: °Your physical activity doesn't need to be restricted. However, if you are very active, you may see some blood in your urine. We suggest that you reduce your activity under these circumstances until the bleeding has stopped. ° °BOWELS: °It is  important to keep your bowels regular during the postoperative period. Straining with bowel movements can cause bleeding. A bowel movement every other day is reasonable. Use a mild laxative if needed, such as Milk of Magnesia 2-3 tablespoons, or 2 Dulcolax tablets. Call if you continue to have problems. If you have been taking narcotics for pain, before, during or after your surgery, you may be constipated. Take a laxative if necessary. ° ° °MEDICATION: °You should resume your pre-surgery medications unless told not to. In addition you will often be given an antibiotic to prevent infection. These should be taken as prescribed until the bottles are finished unless you are having an unusual reaction to one of the drugs. ° °PROBLEMS YOU SHOULD REPORT TO US: °· Fevers over 100.5 Fahrenheit. °· Heavy bleeding, or clots ( See above notes about blood in urine ). °· Inability to urinate. °· Drug reactions ( hives, rash, nausea, vomiting, diarrhea ). °· Severe burning or pain with urination that is not improving. ° °FOLLOW-UP: °You will need a follow-up appointment to monitor your progress. Call for this appointment at the number listed above. Usually the first appointment will be about three to fourteen days after your surgery. ° ° ° ° ° °Post Anesthesia Home Care Instructions ° °Activity: °Get plenty of rest for the remainder of the day. A responsible adult should stay with you for 24 hours following the procedure.  °  any medication unless instructed by your physician -Make any legal decisions or sign important papers.  Meals: Start with liquid foods such as gelatin or soup. Progress to regular foods as tolerated. Avoid greasy, spicy, heavy foods. If nausea and/or vomiting occur, drink only clear liquids until the nausea and/or vomiting subsides. Call your physician if vomiting continues.  Special Instructions/Symptoms: Your throat  may feel dry or sore from the anesthesia or the breathing tube placed in your throat during surgery. If this causes discomfort, gargle with warm salt water. The discomfort should disappear within 24 hours.  If you had a scopolamine patch placed behind your ear for the management of post- operative nausea and/or vomiting:  1. The medication in the patch is effective for 72 hours, after which it should be removed.  Wrap patch in a tissue and discard in the trash. Wash hands thoroughly with soap and water. 2. You may remove the patch earlier than 72 hours if you experience unpleasant side effects which may include dry mouth, dizziness or visual disturbances. 3. Avoid touching the patch. Wash your hands with soap and water after contact with the patch.   1 - You may have urinary urgency (bladder spasms) and bloody urine on / off with stent in place. This is normal.  2 - Call MD or go to ER for fever >102, severe pain / nausea / vomiting not relieved by medications, or acute change in medical status  

## 2016-08-11 NOTE — Transfer of Care (Signed)
Immediate Anesthesia Transfer of Care Note  Patient: [name] [facility]  Procedure(s) Performed: Procedure(s) (LRB): CYSTOSCOPY WITH RETROGRADE PYELOGRAM,  DIAGNOSTIC URETEROSCOPY , STENT EXCHANGE (Left)  Patient Location: PACU  Anesthesia Type: General  Level of Consciousness: awake, sedated, patient cooperative and responds to stimulation  Airway & Oxygen Therapy: Patient Spontanous Breathing and Patient connected to face mask oxygen  Post-op Assessment: Report given to PACU RN, Post -op Vital signs reviewed and stable and Patient moving all extremities  Post vital signs: Reviewed and stable  Complications: No apparent anesthesia complications

## 2016-08-11 NOTE — Anesthesia Postprocedure Evaluation (Signed)
Anesthesia Post Note  Patient: Darek Oldt [facility]  Procedure(s) Performed: Procedure(s) (LRB): CYSTOSCOPY WITH RETROGRADE PYELOGRAM,  DIAGNOSTIC URETEROSCOPY , STENT EXCHANGE (Left)  Patient location during evaluation: PACU Anesthesia Type: General Level of consciousness: awake and alert Pain management: pain level controlled Vital Signs Assessment: post-procedure vital signs reviewed and stable Respiratory status: spontaneous breathing, nonlabored ventilation, respiratory function stable and patient connected to nasal cannula oxygen Cardiovascular status: blood pressure returned to baseline and stable Postop Assessment: no signs of nausea or vomiting Anesthetic complications: no    Last Vitals:  Vitals:   08/11/16 1315 08/11/16 1330  BP: (!) 150/94 (!) 148/85  Pulse: 67 67  Resp: 11 12  Temp:      Last Pain:  Vitals:   08/11/16 1330  TempSrc:   PainSc: 0-No pain                 Ahnesti Townsend J

## 2016-08-11 NOTE — Anesthesia Procedure Notes (Signed)
Procedure Name: LMA Insertion Date/Time: 08/11/2016 11:50 AM Performed by: Justice Rocher Pre-anesthesia Checklist: Patient identified, Emergency Drugs available, Suction available and Patient being monitored Patient Re-evaluated:Patient Re-evaluated prior to inductionOxygen Delivery Method: Circle system utilized Preoxygenation: Pre-oxygenation with 100% oxygen Intubation Type: IV induction Ventilation: Mask ventilation without difficulty LMA: LMA inserted LMA Size: 5.0 Number of attempts: 1 Airway Equipment and Method: Bite block Placement Confirmation: positive ETCO2 and breath sounds checked- equal and bilateral Tube secured with: Tape Dental Injury: Teeth and Oropharynx as per pre-operative assessment

## 2016-08-11 NOTE — Anesthesia Preprocedure Evaluation (Signed)
Anesthesia Evaluation  Patient identified by MRN, date of birth, ID band Patient awake    Reviewed: Allergy & Precautions, NPO status , Patient's Chart, lab work & pertinent test results  History of Anesthesia Complications (+) history of anesthetic complications  Airway Mallampati: II  TM Distance: >3 FB Neck ROM: Full    Dental no notable dental hx.    Pulmonary asthma , sleep apnea and Continuous Positive Airway Pressure Ventilation , pneumonia, resolved, former smoker,    Pulmonary exam normal breath sounds clear to auscultation       Cardiovascular hypertension, + CAD and + Past MI  Normal cardiovascular exam Rhythm:Regular Rate:Normal     Neuro/Psych  Headaches, PSYCHIATRIC DISORDERS Depression  Neuromuscular disease    GI/Hepatic Neg liver ROS, GERD  ,  Endo/Other  negative endocrine ROS  Renal/GU Renal disease  negative genitourinary   Musculoskeletal  (+) Arthritis ,   Abdominal   Peds negative pediatric ROS (+)  Hematology negative hematology ROS (+)   Anesthesia Other Findings   Reproductive/Obstetrics negative OB ROS                             Anesthesia Physical Anesthesia Plan  ASA: III  Anesthesia Plan: General   Post-op Pain Management:    Induction: Intravenous  Airway Management Planned: LMA  Additional Equipment:   Intra-op Plan:   Post-operative Plan: Extubation in OR  Informed Consent: I have reviewed the patients History and Physical, chart, labs and discussed the procedure including the risks, benefits and alternatives for the proposed anesthesia with the patient or authorized representative who has indicated his/her understanding and acceptance.   Dental advisory given  Plan Discussed with: CRNA  Anesthesia Plan Comments: (LMA#5 last time.)        Anesthesia Quick Evaluation

## 2016-08-11 NOTE — Brief Op Note (Signed)
08/11/2016  12:23 PM  PATIENT:  [name]  82 y.o. male  PRE-OPERATIVE DIAGNOSIS:  LEFT HYDRONEPHROSIS, POSSIBLE URETEROPELVIC JUNCTION OBSTRUCTION  POST-OPERATIVE DIAGNOSIS:  POSSIBLE URETEROPELVIC JUNCTION OBSTRUCTION  PROCEDURE:  Procedure(s): CYSTOSCOPY WITH RETROGRADE PYELOGRAM,  DIAGNOSTIC URETEROSCOPY , STENT EXCHANGE (Left)  SURGEON:  Surgeon(s) and Role:    * Alexis Frock, MD - Primary  PHYSICIAN ASSISTANT:   ASSISTANTS: none   ANESTHESIA:   general  EBL:  Total I/O In: 200 [I.V.:200] Out: -   BLOOD ADMINISTERED:none  DRAINS: none   LOCAL MEDICATIONS USED:  NONE  SPECIMEN:  No Specimen  DISPOSITION OF SPECIMEN:  N/A  COUNTS:  YES  TOURNIQUET:  * No tourniquets in log *  DICTATION: .Other Dictation: Dictation Number [id number]  PLAN OF CARE: Discharge to home after PACU  PATIENT DISPOSITION:  PACU - hemodynamically stable.   Delay start of Pharmacological VTE agent (>24hrs) due to surgical blood loss or risk of bleeding: yes

## 2016-08-12 ENCOUNTER — Encounter (HOSPITAL_BASED_OUTPATIENT_CLINIC_OR_DEPARTMENT_OTHER): Payer: Self-pay | Admitting: Urology

## 2016-08-12 ENCOUNTER — Other Ambulatory Visit: Payer: Self-pay | Admitting: Urology

## 2016-08-13 NOTE — Op Note (Signed)
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  PROCEDURE: 1. Cystoscopy with left retrograde pyelogram interpretation. 2. Left diagnostic ureteroscopy. 3. Exchange of left ureteral stent, 6 x 26 Contour, no tether.  ESTIMATED BLOOD LOSS:  Nil.  COMPLICATION:  None.  SPECIMEN:  None.  FINDINGS: 1. Smooth filling defect in the proximal ureter with moderate proximal     hydronephrosis consistent with likely extrinsic compression. 2. Pulsatile extrinsic compression at the area of right ureteral     narrowing by ureteroscopy most likely consistent with extrinsic     crossing vessel. 3. Successful replacement of left ureteral stent proximal in the upper     pole, distal in the urinary bladder.  INDICATIONS:  [name] is a very pleasant, 82 year old gentleman with history of new onset colicky left flank pain.  He underwent actual imaging that revealed new hydronephrosis without ureteral nephrosis and no obvious stones.  There was questionable lower pole crossing vessel. He does have some smoking history as well as a remote history of nephrolithiasis.  He underwent temporizing with left ureteral stent placement in the acute setting.  He was referred for consideration of pyeloplasty after review of images and discussion with the patient.  It was felt that an intermediate step of diagnostic ureteroscopy would be prudent to rule out intraluminal lesions especially  intraluminal neoplasm and he wished to proceed.  Informed consent was obtained and placed in the medical record.  PROCEDURE IN DETAIL:  The patient being [name] verified.  Procedure being left ureteroscopy, possible stricture dilation, possible resection biopsy was confirmed.  Procedure was carried out.  Time-out was performed.  Intravenous antibiotics were administered.  General LMA anesthesia introduced.  The patient placed into a low lithotomy position.  Sterile field was created by prepping and draping the patient's penis, perineum, proximal thighs using iodine x3.  Next, cystourethroscopy was performed using a 23-French rigid cystoscope with offset lens.  Inspection of the anterior and posterior urethra were unremarkable.  Distal end of the ureteral stent was seen in situ.  This was grasped, brought to the level of the urethral meatus through which a 0.038 zip wire was advanced to the upper pole.  The stent was exchanged for an open-ended catheter and left retrograde pyelogram was obtained.  Left retrograde pyelogram demonstrated single left ureter with single- system left kidney.  There was significant hydronephrosis without ureteral nephrosis.  He had a smooth filling defect in the proximal ureter consistent with likely site of obstruction.  The zip wire was once again advanced to the upper pole, set aside as a safety wire.  An 8- [place] feeding tube placed in  the urinary bladder for pressure release. Next, a semi-rigid ureteroscopy was performed of the distal two-thirds of the left ureter alongside a separate Sensor working wire.  No mucosal abnormalities were found.  This was exchanged for a 12/14 medium-sized ureteral access sheath at the level of proximal ureter using fluoroscopic guidance.  Next, flexible digital ureteroscopy was performed in the proximal ureter and systematic inspection of the left kidney including all calices x3.  There was a smooth pulsatile  likely extrinsic compression from crossing vessel at the area of the UPJ that was visualized ureteroscopically.  Photodocumentation was performed. There were no obvious intraluminal neoplasia or stones.  Inspection of the entire left kidney also revealed no neoplasia or stones.  The patient then clearly would likely benefit from definitive pyeloplasty given the intraoperative findings.  As such, the access sheath was removed under continuous vision.  No mucosal abnormalities were found and a new 6 x 26 Contour-type stent was placed using cystoscopic and fluoroscopic guidance.  Good proximal and distal deployment were noted. Bladder was emptied per cystoscope and the procedure then terminated. The patient tolerated the procedure well.  There were no immediate periprocedural complications.  The patient was taken to the postanesthesia care unit in stable condition.    ______________________________ Alexis Frock, MD   ______________________________ Alexis Frock, MD    TM/MEDQ  D:  08/11/2016  T:  08/12/2016  Job:  [id number]

## 2016-08-17 DIAGNOSIS — M0609 Rheumatoid arthritis without rheumatoid factor, multiple sites: Secondary | ICD-10-CM | POA: Diagnosis not present

## 2016-08-17 DIAGNOSIS — M5030 Other cervical disc degeneration, unspecified cervical region: Secondary | ICD-10-CM | POA: Diagnosis not present

## 2016-08-18 ENCOUNTER — Ambulatory Visit (INDEPENDENT_AMBULATORY_CARE_PROVIDER_SITE_OTHER): Payer: Medicare Other | Admitting: General Practice

## 2016-08-18 DIAGNOSIS — Z23 Encounter for immunization: Secondary | ICD-10-CM | POA: Diagnosis not present

## 2016-08-20 DIAGNOSIS — M0609 Rheumatoid arthritis without rheumatoid factor, multiple sites: Secondary | ICD-10-CM | POA: Diagnosis not present

## 2016-08-20 DIAGNOSIS — Z79899 Other long term (current) drug therapy: Secondary | ICD-10-CM | POA: Diagnosis not present

## 2016-08-30 ENCOUNTER — Other Ambulatory Visit: Payer: Self-pay | Admitting: Internal Medicine

## 2016-09-07 DIAGNOSIS — M5030 Other cervical disc degeneration, unspecified cervical region: Secondary | ICD-10-CM | POA: Diagnosis not present

## 2016-09-07 DIAGNOSIS — M79642 Pain in left hand: Secondary | ICD-10-CM | POA: Diagnosis not present

## 2016-09-07 DIAGNOSIS — M0609 Rheumatoid arthritis without rheumatoid factor, multiple sites: Secondary | ICD-10-CM | POA: Diagnosis not present

## 2016-09-09 NOTE — Progress Notes (Signed)
Ekg, stress, lov dr Rayann Heman 1/17, eccho 4/17, chest 6/17 all in epic

## 2016-09-09 NOTE — Patient Instructions (Addendum)
[name] [facility]  09/09/2016   Your procedure is scheduled on: 09/15/16  Report to [facility]  Entrance take [place]  elevators to 3rd floor to  [place] at 1100 AM.  Call this number if you have problems the morning of surgery [phone] BEGIN BOWEL PREP AS PER OFFICE INSTRUCTIONS--CLEAR LIQUIDS SHEET ATTATCHED  Remember: ONLY 1 PERSON MAY GO WITH YOU TO SHORT STAY TO GET  READY MORNING OF YOUR SURGERY.  YOU MAY DRINK CLEAR LIQUIDS MORNING OF SURGERY UNTIL 0700AM--THEN NOTHING BY MOUTH BRING CPAP MASK AND TUBING WITH YOU TO HOSPITAL    Take these medicines the morning of surgery with A SIP OF WATER: METOPROLOL, Omeprazole,Cymbalta,Prednisone May take Oxycodone, Allegra, Lorazepam, Nitroglycerin if needed.  May use albutrol inhaler DO NOT TAKE ANY DIABETIC MEDICATIONS DAY OF YOUR SURGERY                               You may not have any metal on your body including hair pins and              piercings  Do not wear jewelry, make-up, lotions, powders or perfumes, deodorant             Do not wear nail polish.  Do not shave  48 hours prior to surgery.              Men may shave face and neck.   Do not bring valuables to the hospital. [place].  Contacts, dentures or bridgework may not be worn into surgery.  Leave suitcase in the car. After surgery it may be brought to your room.                Please read over the following fact sheets you were given: _____________________________________________________________________             [facility] - Preparing for Surgery Before surgery, you can play an important role.  Because skin is not sterile, your skin needs to be as free of germs as possible.  You can reduce the number of germs on your skin by washing with CHG (chlorahexidine gluconate) soap before surgery.  CHG is an antiseptic cleaner which kills germs and bonds with the skin to continue killing  germs even after washing. Please DO NOT use if you have an allergy to CHG or antibacterial soaps.  If your skin becomes reddened/irritated stop using the CHG and inform your nurse when you arrive at Short Stay. Do not shave (including legs and underarms) for at least 48 hours prior to the first CHG shower.  You may shave your face/neck. Please follow these instructions carefully:  1.  Shower with CHG Soap the night before surgery and the  morning of Surgery.  2.  If you choose to wash your hair, wash your hair first as usual with your  normal  shampoo.  3.  After you shampoo, rinse your hair and body thoroughly to remove the  shampoo.                           4.  Use CHG as you would any other liquid soap.  You can apply chg directly  to the skin and wash                       Gently with a scrungie or clean washcloth.  5.  Apply the CHG Soap to your body ONLY FROM THE NECK DOWN.   Do not use on face/ open                           Wound or open sores. Avoid contact with eyes, ears mouth and genitals (private parts).                       Wash face,  Genitals (private parts) with your normal soap.             6.  Wash thoroughly, paying special attention to the area where your surgery  will be performed.  7.  Thoroughly rinse your body with warm water from the neck down.  8.  DO NOT shower/wash with your normal soap after using and rinsing off  the CHG Soap.                9.  Pat yourself dry with a clean towel.            10.  Wear clean pajamas.            11.  Place clean sheets on your bed the night of your first shower and do not  sleep with pets. Day of Surgery : Do not apply any lotions/deodorants the morning of surgery.  Please wear clean clothes to the hospital/surgery center.  FAILURE TO FOLLOW THESE INSTRUCTIONS MAY RESULT IN THE CANCELLATION OF YOUR SURGERY PATIENT SIGNATURE_________________________________  NURSE  SIGNATURE__________________________________  ________________________________________________________________________    CLEAR LIQUID DIET   Foods Allowed                                                                     Foods Excluded  Coffee and tea, regular and decaf                             liquids that you cannot  Plain Jell-O in any flavor                                             see through such as: Fruit ices (not with fruit pulp)                                     milk, soups, orange juice  Iced Popsicles                                    All solid food Carbonated beverages, regular and diet  Cranberry, grape and apple juices Sports drinks like Gatorade Lightly seasoned clear broth or consume(fat free) Sugar, honey syrup  Sample Menu Breakfast                                Lunch                                     Supper Cranberry juice                    Beef broth                            Chicken broth Jell-O                                     Grape juice                           Apple juice Coffee or tea                        Jell-O                                      Popsicle                                                Coffee or tea                        Coffee or tea  _____________________________________________________________________

## 2016-09-13 ENCOUNTER — Encounter (HOSPITAL_COMMUNITY): Payer: Self-pay

## 2016-09-13 ENCOUNTER — Encounter (HOSPITAL_COMMUNITY)
Admission: RE | Admit: 2016-09-13 | Discharge: 2016-09-13 | Disposition: A | Discharge status: Home / Self Care | Payer: Medicare Other | Source: Ambulatory Visit | Attending: Urology | Admitting: Urology

## 2016-09-13 LAB — CBC
HCT: 40 % (ref 39.0–52.0)
Hemoglobin: 13.4 g/dL (ref 13.0–17.0)
MCH: 32.6 pg (ref 26.0–34.0)
MCHC: 33.5 g/dL (ref 30.0–36.0)
MCV: 97.3 fL (ref 78.0–100.0)
Platelets: 274 10*3/uL (ref 150–400)
RBC: 4.11 MIL/uL — ABNORMAL LOW (ref 4.22–5.81)
RDW: 13.7 % (ref 11.5–15.5)
WBC: 4.9 10*3/uL (ref 4.0–10.5)

## 2016-09-13 LAB — BASIC METABOLIC PANEL
Anion gap: 5 (ref 5–15)
BUN: 16 mg/dL (ref 6–20)
CO2: 28 mmol/L (ref 22–32)
Calcium: 9.4 mg/dL (ref 8.9–10.3)
Chloride: 109 mmol/L (ref 101–111)
Creatinine, Ser: 0.78 mg/dL (ref 0.61–1.24)
GFR calc Af Amer: >60 mL/min (ref >60)
GFR calc non Af Amer: >60 mL/min (ref >60)
Glucose, Bld: 100 mg/dL — ABNORMAL HIGH (ref 65–99)
Potassium: 4.3 mmol/L (ref 3.5–5.1)
Sodium: 142 mmol/L (ref 135–145)

## 2016-09-13 LAB — PROTIME-INR
INR: 0.92
Prothrombin Time: 12.3 seconds (ref 11.4–15.2)

## 2016-09-13 LAB — ABO/RH: ABO/RH(D): A POS

## 2016-09-13 NOTE — Progress Notes (Signed)
States last dose of eloquis will be today, and he has bowel prep instructions

## 2016-09-15 ENCOUNTER — Inpatient Hospital Stay (HOSPITAL_COMMUNITY)
Admission: RE | Admit: 2016-09-15 | Discharge: 2016-09-16 | DRG: 660 | Disposition: A | Discharge status: Home / Self Care | Payer: Medicare Other | Source: Ambulatory Visit | Attending: Urology | Admitting: Urology

## 2016-09-15 ENCOUNTER — Inpatient Hospital Stay (HOSPITAL_COMMUNITY): Payer: Medicare Other | Admitting: Certified Registered Nurse Anesthetist

## 2016-09-15 ENCOUNTER — Encounter (HOSPITAL_COMMUNITY)
Admission: RE | Disposition: A | Discharge status: Home / Self Care | Payer: Self-pay | Source: Ambulatory Visit | Attending: Urology

## 2016-09-15 ENCOUNTER — Encounter (HOSPITAL_COMMUNITY): Payer: Self-pay | Admitting: *Deleted

## 2016-09-15 DIAGNOSIS — Q6239 Other obstructive defects of renal pelvis and ureter: Secondary | ICD-10-CM

## 2016-09-15 DIAGNOSIS — N135 Crossing vessel and stricture of ureter without hydronephrosis: Secondary | ICD-10-CM | POA: Diagnosis not present

## 2016-09-15 DIAGNOSIS — K219 Gastro-esophageal reflux disease without esophagitis: Secondary | ICD-10-CM | POA: Diagnosis not present

## 2016-09-15 DIAGNOSIS — Z961 Presence of intraocular lens: Secondary | ICD-10-CM | POA: Diagnosis present

## 2016-09-15 DIAGNOSIS — N1339 Other hydronephrosis: Secondary | ICD-10-CM | POA: Diagnosis present

## 2016-09-15 DIAGNOSIS — N2889 Other specified disorders of kidney and ureter: Secondary | ICD-10-CM | POA: Diagnosis not present

## 2016-09-15 DIAGNOSIS — I10 Essential (primary) hypertension: Secondary | ICD-10-CM | POA: Diagnosis not present

## 2016-09-15 DIAGNOSIS — Z87891 Personal history of nicotine dependence: Secondary | ICD-10-CM | POA: Diagnosis not present

## 2016-09-15 DIAGNOSIS — Z888 Allergy status to other drugs, medicaments and biological substances status: Secondary | ICD-10-CM

## 2016-09-15 DIAGNOSIS — Z87442 Personal history of urinary calculi: Secondary | ICD-10-CM | POA: Diagnosis not present

## 2016-09-15 DIAGNOSIS — M069 Rheumatoid arthritis, unspecified: Secondary | ICD-10-CM | POA: Diagnosis present

## 2016-09-15 DIAGNOSIS — Z981 Arthrodesis status: Secondary | ICD-10-CM

## 2016-09-15 DIAGNOSIS — Z955 Presence of coronary angioplasty implant and graft: Secondary | ICD-10-CM

## 2016-09-15 DIAGNOSIS — E785 Hyperlipidemia, unspecified: Secondary | ICD-10-CM | POA: Diagnosis not present

## 2016-09-15 DIAGNOSIS — G4733 Obstructive sleep apnea (adult) (pediatric): Secondary | ICD-10-CM | POA: Diagnosis present

## 2016-09-15 DIAGNOSIS — Q6211 Congenital occlusion of ureteropelvic junction: Secondary | ICD-10-CM | POA: Diagnosis not present

## 2016-09-15 DIAGNOSIS — Z974 Presence of external hearing-aid: Secondary | ICD-10-CM | POA: Diagnosis not present

## 2016-09-15 DIAGNOSIS — Z8249 Family history of ischemic heart disease and other diseases of the circulatory system: Secondary | ICD-10-CM

## 2016-09-15 DIAGNOSIS — I480 Paroxysmal atrial fibrillation: Secondary | ICD-10-CM | POA: Diagnosis not present

## 2016-09-15 DIAGNOSIS — I252 Old myocardial infarction: Secondary | ICD-10-CM

## 2016-09-15 DIAGNOSIS — I2510 Atherosclerotic heart disease of native coronary artery without angina pectoris: Secondary | ICD-10-CM | POA: Diagnosis present

## 2016-09-15 HISTORY — PX: ROBOT ASSISTED PYELOPLASTY: SHX5143

## 2016-09-15 LAB — TYPE AND SCREEN
ABO/RH(D): A POS
Antibody Screen: NEGATIVE

## 2016-09-15 SURGERY — PYELOPLASTY, ROBOT-ASSISTED
Anesthesia: General | Laterality: Left

## 2016-09-15 MED ORDER — METOPROLOL TARTRATE 25 MG PO TABS
25.0000 mg | ORAL_TABLET | Freq: Two times a day (BID) | ORAL | Status: DC
  Administered 2016-09-15 – 2016-09-16 (×2): 25 mg via ORAL
  Filled 2016-09-15 (×2): qty 1

## 2016-09-15 MED ORDER — FENTANYL CITRATE (PF) 100 MCG/2ML IJ SOLN
25.0000 ug | INTRAMUSCULAR | Status: DC | PRN
  Administered 2016-09-15 (×3): 50 ug via INTRAVENOUS

## 2016-09-15 MED ORDER — PHENYLEPHRINE HCL 10 MG/ML IJ SOLN
INTRAMUSCULAR | Status: DC | PRN
  Administered 2016-09-15 (×2): 80 ug via INTRAVENOUS

## 2016-09-15 MED ORDER — DEXTROSE-NACL 5-0.45 % IV SOLN
INTRAVENOUS | Status: DC
  Administered 2016-09-15 – 2016-09-16 (×2): via INTRAVENOUS

## 2016-09-15 MED ORDER — PREDNISONE 1 MG PO TABS
3.0000 mg | ORAL_TABLET | Freq: Every morning | ORAL | Status: DC
  Administered 2016-09-16: 3 mg via ORAL
  Filled 2016-09-15: qty 3

## 2016-09-15 MED ORDER — OXYCODONE-ACETAMINOPHEN 5-325 MG PO TABS: 1.0000 | ORAL_TABLET | Freq: Four times a day (QID) | ORAL | 0 refills | Status: DC | PRN

## 2016-09-15 MED ORDER — FENTANYL CITRATE (PF) 250 MCG/5ML IJ SOLN
INTRAMUSCULAR | Status: AC
  Filled 2016-09-15: qty 5

## 2016-09-15 MED ORDER — ALBUTEROL SULFATE (2.5 MG/3ML) 0.083% IN NEBU: 3.0000 mL | INHALATION_SOLUTION | Freq: Four times a day (QID) | RESPIRATORY_TRACT | Status: DC | PRN

## 2016-09-15 MED ORDER — FENTANYL CITRATE (PF) 100 MCG/2ML IJ SOLN
INTRAMUSCULAR | Status: AC
  Filled 2016-09-15: qty 2

## 2016-09-15 MED ORDER — ACETAMINOPHEN 500 MG PO TABS
1000.0000 mg | ORAL_TABLET | Freq: Four times a day (QID) | ORAL | Status: AC
  Administered 2016-09-15 – 2016-09-16 (×4): 1000 mg via ORAL
  Filled 2016-09-15 (×4): qty 2

## 2016-09-15 MED ORDER — ROCURONIUM BROMIDE 100 MG/10ML IV SOLN
INTRAVENOUS | Status: DC | PRN
  Administered 2016-09-15: 40 mg via INTRAVENOUS
  Administered 2016-09-15 (×2): 20 mg via INTRAVENOUS

## 2016-09-15 MED ORDER — DEXAMETHASONE SODIUM PHOSPHATE 10 MG/ML IJ SOLN
INTRAMUSCULAR | Status: AC
  Filled 2016-09-15: qty 1

## 2016-09-15 MED ORDER — LIDOCAINE HCL (CARDIAC) 20 MG/ML IV SOLN
INTRAVENOUS | Status: DC | PRN
  Administered 2016-09-15: 80 mg via INTRAVENOUS

## 2016-09-15 MED ORDER — PANTOPRAZOLE SODIUM 40 MG PO TBEC
80.0000 mg | DELAYED_RELEASE_TABLET | Freq: Every day | ORAL | Status: DC
  Administered 2016-09-16: 80 mg via ORAL
  Filled 2016-09-15: qty 2

## 2016-09-15 MED ORDER — EPHEDRINE SULFATE 50 MG/ML IJ SOLN
INTRAMUSCULAR | Status: DC | PRN
  Administered 2016-09-15 (×4): 10 mg via INTRAVENOUS
  Administered 2016-09-15: 5 mg via INTRAVENOUS

## 2016-09-15 MED ORDER — ONDANSETRON HCL 4 MG/2ML IJ SOLN
4.0000 mg | INTRAMUSCULAR | Status: DC | PRN
  Administered 2016-09-15: 4 mg via INTRAVENOUS
  Filled 2016-09-15: qty 2

## 2016-09-15 MED ORDER — HYDRALAZINE HCL 20 MG/ML IJ SOLN
INTRAMUSCULAR | Status: AC
Start: 2016-09-15 — End: 2016-09-15
  Filled 2016-09-15: qty 1

## 2016-09-15 MED ORDER — LACTATED RINGERS IR SOLN
Status: DC | PRN
  Administered 2016-09-15: 1000 mL

## 2016-09-15 MED ORDER — CEFAZOLIN SODIUM-DEXTROSE 2-4 GM/100ML-% IV SOLN
INTRAVENOUS | Status: AC
  Filled 2016-09-15: qty 100

## 2016-09-15 MED ORDER — LACTATED RINGERS IV SOLN
INTRAVENOUS | Status: DC
  Administered 2016-09-15 (×2): via INTRAVENOUS

## 2016-09-15 MED ORDER — OXYCODONE HCL 5 MG PO TABS
5.0000 mg | ORAL_TABLET | ORAL | Status: DC | PRN
  Administered 2016-09-15 – 2016-09-16 (×3): 5 mg via ORAL
  Filled 2016-09-15 (×3): qty 1

## 2016-09-15 MED ORDER — ROCURONIUM BROMIDE 50 MG/5ML IV SOSY
PREFILLED_SYRINGE | INTRAVENOUS | Status: AC
Start: 2016-09-15 — End: 2016-09-15
  Filled 2016-09-15: qty 5

## 2016-09-15 MED ORDER — PROPOFOL 10 MG/ML IV BOLUS
INTRAVENOUS | Status: DC | PRN
  Administered 2016-09-15: 150 mg via INTRAVENOUS

## 2016-09-15 MED ORDER — WATER FOR IRRIGATION, STERILE IR SOLN
Status: DC | PRN
  Administered 2016-09-15: 1000 mL

## 2016-09-15 MED ORDER — DIPHENHYDRAMINE HCL 12.5 MG/5ML PO ELIX: 12.5000 mg | ORAL_SOLUTION | Freq: Four times a day (QID) | ORAL | Status: DC | PRN

## 2016-09-15 MED ORDER — SODIUM CHLORIDE 0.9 % IJ SOLN
INTRAMUSCULAR | Status: AC
  Filled 2016-09-15: qty 20

## 2016-09-15 MED ORDER — CEFAZOLIN SODIUM-DEXTROSE 2-4 GM/100ML-% IV SOLN
2.0000 g | INTRAVENOUS | Status: AC
  Administered 2016-09-15: 2 g via INTRAVENOUS
  Filled 2016-09-15: qty 100

## 2016-09-15 MED ORDER — HYDRALAZINE HCL 20 MG/ML IJ SOLN
INTRAMUSCULAR | Status: DC | PRN
  Administered 2016-09-15 (×2): 2 mg via INTRAVENOUS

## 2016-09-15 MED ORDER — NITROGLYCERIN 0.4 MG SL SUBL: 0.4000 mg | SUBLINGUAL_TABLET | SUBLINGUAL | Status: DC | PRN

## 2016-09-15 MED ORDER — PHENYLEPHRINE 40 MCG/ML (10ML) SYRINGE FOR IV PUSH (FOR BLOOD PRESSURE SUPPORT)
PREFILLED_SYRINGE | INTRAVENOUS | Status: AC
  Filled 2016-09-15: qty 10

## 2016-09-15 MED ORDER — DULOXETINE HCL 60 MG PO CPEP
60.0000 mg | ORAL_CAPSULE | Freq: Every morning | ORAL | Status: DC
  Administered 2016-09-16: 60 mg via ORAL
  Filled 2016-09-15: qty 1

## 2016-09-15 MED ORDER — SUGAMMADEX SODIUM 500 MG/5ML IV SOLN
INTRAVENOUS | Status: DC | PRN
  Administered 2016-09-15: 330.4 mg via INTRAVENOUS

## 2016-09-15 MED ORDER — MAGNESIUM CITRATE PO SOLN
1.0000 | Freq: Once | ORAL | Status: DC
  Filled 2016-09-15: qty 296

## 2016-09-15 MED ORDER — EPHEDRINE 5 MG/ML INJ
INTRAVENOUS | Status: AC
  Filled 2016-09-15: qty 10

## 2016-09-15 MED ORDER — PROPOFOL 10 MG/ML IV BOLUS
INTRAVENOUS | Status: AC
  Filled 2016-09-15: qty 20

## 2016-09-15 MED ORDER — ONDANSETRON HCL 4 MG/2ML IJ SOLN
INTRAMUSCULAR | Status: DC | PRN
  Administered 2016-09-15: 4 mg via INTRAVENOUS

## 2016-09-15 MED ORDER — LIDOCAINE 2% (20 MG/ML) 5 ML SYRINGE
INTRAMUSCULAR | Status: AC
  Filled 2016-09-15: qty 5

## 2016-09-15 MED ORDER — SUCCINYLCHOLINE CHLORIDE 20 MG/ML IJ SOLN
INTRAMUSCULAR | Status: AC
  Filled 2016-09-15: qty 1

## 2016-09-15 MED ORDER — BUPIVACAINE LIPOSOME 1.3 % IJ SUSP
INTRAMUSCULAR | Status: DC | PRN
  Administered 2016-09-15: 20 mL

## 2016-09-15 MED ORDER — LABETALOL HCL 5 MG/ML IV SOLN
INTRAVENOUS | Status: AC
  Filled 2016-09-15: qty 4

## 2016-09-15 MED ORDER — LISINOPRIL 5 MG PO TABS
5.0000 mg | ORAL_TABLET | Freq: Every day | ORAL | Status: DC
  Administered 2016-09-16: 5 mg via ORAL
  Filled 2016-09-15: qty 1

## 2016-09-15 MED ORDER — DIPHENHYDRAMINE HCL 50 MG/ML IJ SOLN: 12.5000 mg | Freq: Four times a day (QID) | INTRAMUSCULAR | Status: DC | PRN

## 2016-09-15 MED ORDER — ONDANSETRON HCL 4 MG/2ML IJ SOLN
INTRAMUSCULAR | Status: AC
  Filled 2016-09-15: qty 2

## 2016-09-15 MED ORDER — FENTANYL CITRATE (PF) 100 MCG/2ML IJ SOLN
INTRAMUSCULAR | Status: AC
  Administered 2016-09-15: 50 ug via INTRAVENOUS
  Filled 2016-09-15: qty 2

## 2016-09-15 MED ORDER — FENTANYL CITRATE (PF) 100 MCG/2ML IJ SOLN
INTRAMUSCULAR | Status: DC | PRN
  Administered 2016-09-15 (×9): 50 ug via INTRAVENOUS

## 2016-09-15 MED ORDER — DEXAMETHASONE SODIUM PHOSPHATE 10 MG/ML IJ SOLN
INTRAMUSCULAR | Status: DC | PRN
  Administered 2016-09-15: 10 mg via INTRAVENOUS

## 2016-09-15 MED ORDER — SUGAMMADEX SODIUM 500 MG/5ML IV SOLN
INTRAVENOUS | Status: AC
  Filled 2016-09-15: qty 5

## 2016-09-15 MED ORDER — BUPIVACAINE LIPOSOME 1.3 % IJ SUSP
INTRAMUSCULAR | Status: AC
  Filled 2016-09-15: qty 20

## 2016-09-15 MED ORDER — SUCCINYLCHOLINE CHLORIDE 20 MG/ML IJ SOLN
INTRAMUSCULAR | Status: DC | PRN
  Administered 2016-09-15: 100 mg via INTRAVENOUS

## 2016-09-15 MED ORDER — HYDROMORPHONE HCL 1 MG/ML IJ SOLN
0.5000 mg | INTRAMUSCULAR | Status: DC | PRN
  Administered 2016-09-15 – 2016-09-16 (×3): 1 mg via INTRAVENOUS
  Filled 2016-09-15 (×3): qty 1

## 2016-09-15 SURGICAL SUPPLY — 62 items
BAG URO CATCHER STRL LF (MISCELLANEOUS) IMPLANT
CATH INTERMIT  6FR 70CM (CATHETERS) IMPLANT
CHLORAPREP W/TINT 26ML (MISCELLANEOUS) ×2 IMPLANT
CLIP LIGATING HEM O LOK PURPLE (MISCELLANEOUS) ×1 IMPLANT
CLIP LIGATING HEMO O LOK GREEN (MISCELLANEOUS) ×1 IMPLANT
COVER TIP SHEARS 8 DVNC (MISCELLANEOUS) ×1 IMPLANT
COVER TIP SHEARS 8MM DA VINCI (MISCELLANEOUS) ×1
DECANTER SPIKE VIAL GLASS SM (MISCELLANEOUS) ×1 IMPLANT
DRAIN CHANNEL 15F RND FF 3/16 (WOUND CARE) ×2 IMPLANT
DRAPE ARM DVNC X/XI (DISPOSABLE) ×4 IMPLANT
DRAPE COLUMN DVNC XI (DISPOSABLE) ×1 IMPLANT
DRAPE DA VINCI XI ARM (DISPOSABLE) ×4
DRAPE DA VINCI XI COLUMN (DISPOSABLE) ×1
DRAPE INCISE IOBAN 66X45 STRL (DRAPES) ×2 IMPLANT
DRAPE LAPAROSCOPIC ABDOMINAL (DRAPES) ×2 IMPLANT
DRAPE SHEET LG 3/4 BI-LAMINATE (DRAPES) ×2 IMPLANT
DRSG TEGADERM 4X4.75 (GAUZE/BANDAGES/DRESSINGS) ×1 IMPLANT
ELECT PENCIL ROCKER SW 15FT (MISCELLANEOUS) ×2 IMPLANT
ELECT REM PT RETURN 9FT ADLT (ELECTROSURGICAL) ×2
ELECTRODE REM PT RTRN 9FT ADLT (ELECTROSURGICAL) ×1 IMPLANT
EVACUATOR SILICONE 100CC (DRAIN) ×2 IMPLANT
GAUZE SPONGE 2X2 8PLY STRL LF (GAUZE/BANDAGES/DRESSINGS) IMPLANT
GLOVE BIOGEL M STRL SZ7.5 (GLOVE) ×4 IMPLANT
GOWN STRL REUS W/TWL LRG LVL3 (GOWN DISPOSABLE) ×6 IMPLANT
GUIDEWIRE ANG ZIPWIRE 038X150 (WIRE) IMPLANT
IRRIG SUCT STRYKERFLOW 2 WTIP (MISCELLANEOUS) ×2
IRRIGATION SUCT STRKRFLW 2 WTP (MISCELLANEOUS) IMPLANT
KIT BASIN OR (CUSTOM PROCEDURE TRAY) ×2 IMPLANT
MARKER SKIN DUAL TIP RULER LAB (MISCELLANEOUS) ×1 IMPLANT
NDL INSUFFLATION 14GA 120MM (NEEDLE) ×1 IMPLANT
NEEDLE INSUFFLATION 14GA 120MM (NEEDLE) ×2 IMPLANT
NS IRRIG 1000ML POUR BTL (IV SOLUTION) ×1 IMPLANT
PACK CYSTO (CUSTOM PROCEDURE TRAY) IMPLANT
PAD POSITIONING PINK XL (MISCELLANEOUS) IMPLANT
PORT ACCESS TROCAR AIRSEAL 12 (TROCAR) IMPLANT
PORT ACCESS TROCAR AIRSEAL 5M (TROCAR) ×1
POSITIONER SURGICAL ARM (MISCELLANEOUS) ×4 IMPLANT
SEAL CANN UNIV 5-8 DVNC XI (MISCELLANEOUS) ×4 IMPLANT
SEAL XI 5MM-8MM UNIVERSAL (MISCELLANEOUS) ×4
SET TRI-LUMEN FLTR TB AIRSEAL (TUBING) IMPLANT
SHEET LAVH (DRAPES) IMPLANT
SOLUTION ELECTROLUBE (MISCELLANEOUS) ×2 IMPLANT
SPONGE GAUZE 2X2 STER 10/PKG (GAUZE/BANDAGES/DRESSINGS) ×1
SPONGE LAP 18X18 X RAY DECT (DISPOSABLE) ×1 IMPLANT
SPONGE LAP 4X18 X RAY DECT (DISPOSABLE) ×2 IMPLANT
SUT ETHILON 3 0 PS 1 (SUTURE) ×1 IMPLANT
SUT MNCRL 3 0 VIOLET RB1 (SUTURE) ×1 IMPLANT
SUT MONOCRYL 3 0 RB1 (SUTURE) ×3
SUT VIC AB 0 CT1 27 (SUTURE)
SUT VIC AB 0 CT1 27XBRD ANTBC (SUTURE) ×3 IMPLANT
SUT VIC AB 0 UR5 27 (SUTURE) ×2 IMPLANT
SUT VICRYL 0 UR6 27IN ABS (SUTURE) ×2 IMPLANT
SUT VLOC BARB 180 ABS3/0GR12 (SUTURE) ×2
SUTURE VLOC BRB 180 ABS3/0GR12 (SUTURE) IMPLANT
TOWEL NATURAL 10PK STERILE (DISPOSABLE) ×1 IMPLANT
TOWEL OR NON WOVEN STRL DISP B (DISPOSABLE) ×2 IMPLANT
TRAY FOLEY W/METER SILVER 16FR (SET/KITS/TRAYS/PACK) ×2 IMPLANT
TRAY LAPAROSCOPIC (CUSTOM PROCEDURE TRAY) ×2 IMPLANT
TROCAR BLADELESS OPT 5 100 (ENDOMECHANICALS) ×1 IMPLANT
TROCAR XCEL 12X100 BLDLESS (ENDOMECHANICALS) ×1 IMPLANT
TUBING CONNECTING 10 (TUBING) ×2 IMPLANT
WATER STERILE IRR 1500ML POUR (IV SOLUTION) ×1 IMPLANT

## 2016-09-15 NOTE — Brief Op Note (Signed)
09/15/2016  3:11 PM  PATIENT:  [name]  82 y.o. male  PRE-OPERATIVE DIAGNOSIS:  LEFT URETEROPELVIC JUNCTION OBSTRUCTION  POST-OPERATIVE DIAGNOSIS:  LEFT URETEROPELVIC JUNCTION OBSTRUCTION  PROCEDURE:  Procedure(s): XI ROBOTIC ASSISTED PYELOPLASTY (Left)  SURGEON:  Surgeon(s) and Role:    * Alexis Frock, MD - Primary  PHYSICIAN ASSISTANT:   ASSISTANTS: Debbrah Alar PA   ANESTHESIA:   local and general  EBL:  Total I/O In: 2000 [I.V.:2000] Out: 125 [Urine:100; Blood:25]  BLOOD ADMINISTERED:none  DRAINS: 1 - Foley to Gravity; 2 - JP to bulb   LOCAL MEDICATIONS USED:  MARCAINE     SPECIMEN:  Source of Specimen:  LEFT UPJ Obstruction  DISPOSITION OF SPECIMEN:  PATHOLOGY  COUNTS:  YES  TOURNIQUET:  * No tourniquets in log *  DICTATION: .Other Dictation: Dictation Number [id number]  PLAN OF CARE: Admit to inpatient   PATIENT DISPOSITION:  PACU - hemodynamically stable.   Delay start of Pharmacological VTE agent (>24hrs) due to surgical blood loss or risk of bleeding: yes

## 2016-09-15 NOTE — Anesthesia Postprocedure Evaluation (Signed)
Anesthesia Post Note  Patient: [name] [facility]  Procedure(s) Performed: Procedure(s) (LRB): XI ROBOTIC ASSISTED PYELOPLASTY (Left)  Patient location during evaluation: PACU Anesthesia Type: General Level of consciousness: sedated Pain management: pain level controlled Vital Signs Assessment: post-procedure vital signs reviewed and stable Respiratory status: spontaneous breathing and respiratory function stable Cardiovascular status: stable Anesthetic complications: no    Last Vitals:  Vitals:   09/15/16 1615 09/15/16 1638  BP: (!) 150/84 130/81  Pulse: 78 78  Resp: 14   Temp: 36.5 C 36.6 C    Last Pain:  Vitals:   09/15/16 1615  TempSrc:   PainSc: 4                  [name]

## 2016-09-15 NOTE — Transfer of Care (Signed)
Immediate Anesthesia Transfer of Care Note  Patient: [name] [facility]  Procedure(s) Performed: Procedure(s): XI ROBOTIC ASSISTED PYELOPLASTY (Left)  Patient Location: PACU  Anesthesia Type:General  Level of Consciousness:  sedated, patient cooperative and responds to stimulation  Airway & Oxygen Therapy:Patient Spontanous Breathing and Patient connected to face mask oxgen  Post-op Assessment:  Report given to PACU RN and Post -op Vital signs reviewed and stable  Post vital signs:  Reviewed and stable  Last Vitals:  Vitals:   09/15/16 1054 09/15/16 1532  BP: 125/71 (!) (P) 150/83  Pulse: 64 78  Resp: 16 (P) 17  Temp: 36.3 C [id number]    Complications: No apparent anesthesia complications

## 2016-09-15 NOTE — Discharge Instructions (Signed)

## 2016-09-15 NOTE — Anesthesia Procedure Notes (Signed)
Procedure Name: Intubation Date/Time: 09/15/2016 1:51 PM Performed by: West Pugh Pre-anesthesia Checklist: Patient identified, Emergency Drugs available, Suction available, Patient being monitored and Timeout performed Patient Re-evaluated:Patient Re-evaluated prior to inductionOxygen Delivery Method: Circle system utilized Preoxygenation: Pre-oxygenation with 100% oxygen Intubation Type: IV induction Ventilation: Mask ventilation without difficulty Laryngoscope Size: Mac and 4 Grade View: Grade II Tube type: Oral Tube size: 7.5 mm Number of attempts: 1 Airway Equipment and Method: Stylet Placement Confirmation: ETT inserted through vocal cords under direct vision,  positive ETCO2,  CO2 detector and breath sounds checked- equal and bilateral Secured at: 22 cm Tube secured with: Tape Dental Injury: Teeth and Oropharynx as per pre-operative assessment

## 2016-09-15 NOTE — H&P (Signed)
[name] is an 82 y.o. male.    Chief Complaint: PRe-op LEFT pyeloplasty  HPI:   1 - Left UPJ Obstruction - Worsening left hydronephrosis w/o ureteronephrosis by non-contrast CT 2017. This is new / worsened since prior imaging 2015. Cr 1.0. Left kidney with preserved parenchyma. NO h/o left ureteral instrumentation / trauma.  Had 6x26 JJ stent placed 07/12/16 by Dr. Jeffie Pollock during suspect [name]'s crises and subsequent excahnge at time of diagnostic ureteroscopy 07/2016 which revealed no intraluminal obstruction but fidings most consistent with extrinsic obstruction.    PMH sig for CAD/MI/AFib/Eliquus (follows Allred, presently not limiting), Rheumatorid (pred + biologic, managed by Dr. Amil Amen with Rheum), No prior abdominal or chest surgeries. His PCP is [name] MD>   Today "[name]" is seen to proceed with LEFT robotic pyeloplasty. No interval fevers.    Past Medical History:  Diagnosis Date  . CAD (coronary artery disease) 12/2013--  cardiologist-  dr allred/ dr turner   s/p inferior STEMI 01/08/2014; [place] 01/08/14: total RCA occlusion s/p 3.5x85m Xience DES distal RCA and 3.5x28 mm DES mid RCA, 60-70% mid LAD stenosis, EF 55%  . Complication of anesthesia    'long to wake up after back surgery " 09/2003  . Diverticulosis of colon   . GERD (gastroesophageal reflux disease)   . History of cellulitis    05-26-2015  LLE  . History of colon polyps    1998- benign/  2008 adenomatous   . History of kidney stones    2013  . History of squamous cell carcinoma excision    2013;  2015;  06-12-2015 right leg/  02/ and 05/ 2017  left ear and left leg  . History of ST elevation myocardial infarction (STEMI) 01/08/2014   acute infertor--  s/p  DES x2 to RCA  . Hydronephrosis, left   . Hyperlipidemia   . Hypertension   . Migraine with aura   . OSA on CPAP 06/19/2015   Moderate OSA with AHI 18/hr  per study 05-20-2015  . Osteoarthritis   . Palpitations   . Paroxysmal atrial fibrillation  ([facility] 4/16   chads2vasc score is at least 4  . Premature atrial contractions   . RA (rheumatoid arthritis) ([facility]    rheumatologist-  dr jLeigh [place] . S/P drug eluting coronary stent placement 01/08/2014   x2  to mid and distal RCA  . Sinusitis, chronic 10/17/2015  . Wears glasses   . Wears hearing aid    bilateral    Past Surgical History:  Procedure Laterality Date  . BACK SURGERY    . CARDIOVASCULAR STRESS TEST  11/21/2015   Low risk nuclear study w/ a small diaphragmatic attenuation artifact, no ischemia/  normal LV function and wall motion , ef 63%  . CATARACT EXTRACTION W/ INTRAOCULAR LENS  IMPLANT, BILATERAL  2015  . COLONOSCOPY  last one 06-09-2011  . CORONARY ANGIOPLASTY    . CYSTOSCOPY W/ RETROGRADES Left 07/12/2016   Procedure: CYSTOSCOPY WITH RETROGRADE PYELOGRAM LEFT URETERAL STENT;  Surgeon: JIrine Seal MD;  Location: [facility];  Service: Urology;  Laterality: Left;  . CYSTOSCOPY WITH RETROGRADE PYELOGRAM, URETEROSCOPY AND STENT PLACEMENT Left 08/11/2016   Procedure: CYSTOSCOPY WITH RETROGRADE PYELOGRAM,  DIAGNOSTIC URETEROSCOPY , STENT EXCHANGE;  Surgeon: TAlexis Frock MD;  [facility]  Service: Urology;  Laterality: Left;  . 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   severe fibromatosis palm and fingers  . LEFT HEART CATHETERIZATION WITH CORONARY ANGIOGRAM N/A 01/08/2014   Procedure:  LEFT HEART CATHETERIZATION WITH CORONARY ANGIOGRAM;  Surgeon: Troy Sine, MD;  Location: [facility] C[facility] LAB;  Service: Cardiovascular;  Laterality:N/A;  total/ subtotal RCA/  mLAD 23-76% w/ mid systolic bridging/  preserved global LVF, ef 55%  . MOHS SURGERY  x2  feb and may 2017   left ear /  left leg  ([facility])  . PERCUTANEOUS CORONARY STENT INTERVENTION (PCI-S)  01/08/2014   Procedure: PERCUTANEOUS CORONARY STENT INTERVENTION (PCI-S);  Surgeon: Troy Sine, MD;  Location: [facility] CATH LAB;  Service: Cardiovascular;;  DES to mid and distal RCA  . POSTERIOR LUMBAR  FUSION  10/01/2003   and Laminectomy/ diskectomy  L4 -- S1  . TONSILLECTOMY    . TRANSTHORACIC ECHOCARDIOGRAM  02/23/2016   mild LVH,  ef 50-55%,  grade 1 diastolic dysfunction/  mild to moderate AV calcification w/ no stenosis or regurg./  trivial MR and TR/  mild PR    Family History  Problem Relation Age of Onset  . Hypertension Mother   . Heart disease Father   . Colon cancer Neg Hx    Social History:  reports that he quit smoking about 48 years ago. His smoking use included Cigarettes. He quit after 10.00 years of use. He has never used smokeless tobacco. He reports that he drinks about 4.2 - 8.4 oz of alcohol per week . He reports that he does not use drugs.  Allergies:  Allergies  Allergen Reactions  . Methotrexate Other (See Comments)    REACTION: tachycardia  . Lisinopril Other (See Comments)    cough  . Atorvastatin Other (See Comments)    Muscle pain, leg cramps  . Pravastatin Sodium Other (See Comments)    REACTION: cramps, fatigue    No prescriptions prior to admission.    Results for orders placed or performed during the hospital encounter of 09/13/16 (from the past 48 hour(s))  Basic metabolic panel     Status: Abnormal   Collection Time: 09/13/16  9:42 AM  Result Value Ref Range   Sodium 142 135 - 145 mmol/L   Potassium 4.3 3.5 - 5.1 mmol/L   Chloride 109 101 - 111 mmol/L   CO2 28 22 - 32 mmol/L   Glucose, Bld 100 (H) 65 - 99 mg/dL   BUN 16 6 - 20 mg/dL   Creatinine, Ser 0.78 0.61 - 1.24 mg/dL   Calcium 9.4 8.9 - 10.3 mg/dL   GFR calc non Af Amer >60 >60 mL/min   GFR calc Af Amer >60 >60 mL/min    Comment: (NOTE) The eGFR has been calculated using the CKD EPI equation. This calculation has not been validated in all clinical situations. eGFR's persistently <60 mL/min signify possible Chronic Kidney Disease.    Anion gap 5 5 - 15  CBC     Status: Abnormal   Collection Time: 09/13/16  9:42 AM  Result Value Ref Range   WBC 4.9 4.0 - 10.5 K/uL   RBC  4.11 (L) 4.22 - 5.81 MIL/uL   Hemoglobin 13.4 13.0 - 17.0 g/dL   HCT 40.0 39.0 - 52.0 %   MCV 97.3 78.0 - 100.0 fL   MCH 32.6 26.0 - 34.0 pg   MCHC 33.5 30.0 - 36.0 g/dL   RDW 13.7 11.5 - 15.5 %   Platelets 274 150 - 400 K/uL  Protime-INR     Status: None   Collection Time: 09/13/16  9:42 AM  Result Value Ref Range   Prothrombin Time 12.3 11.4 - 15.2 seconds  INR 0.92   Type and screen All Cardiac and thoracic surgeries, spinal fusions, myomectomies, craniotomies, colon & liver resections, total joint revisions, same day c-section with placenta previa or accreta.     Status: None   Collection Time: 09/13/16  9:45 AM  Result Value Ref Range   ABO/RH(D) A POS    Antibody Screen NEG    Sample Expiration 09/18/2016    Extend sample reason NO TRANSFUSIONS OR PREGNANCY IN THE PAST 3 MONTHS   ABO/Rh     Status: None   Collection Time: 09/13/16  9:45 AM  Result Value Ref Range   ABO/RH(D) A POS    No results found.  Review of Systems  Constitutional: Negative.  Negative for chills and fever.  HENT: Negative.   Eyes: Negative.   Cardiovascular: Negative.   Gastrointestinal: Negative.   Genitourinary: Positive for urgency.  Musculoskeletal: Negative.   Skin: Negative.   Neurological: Negative.   Endo/Heme/Allergies: Negative.   Psychiatric/Behavioral: Negative.     There were no vitals taken for this visit. Physical Exam  Constitutional: He appears well-developed.  HENT:  Head: Normocephalic.  Eyes: Pupils are equal, round, and reactive to light.  Neck: Normal range of motion.  Cardiovascular: Normal rate.   Respiratory: Effort normal.  GI: Soft.  Genitourinary: Penis normal.  Musculoskeletal: Normal range of motion.  Neurological: He is alert.  Skin: Skin is warm.  Psychiatric: He has a normal mood and affect.     Assessment/Plan  1 - Left UPJ Obstruction - proceed as planned with LEFT pyeloplasty. Risks, benefits, expected peri-op course, need for temporary  catheters / drains / stents discussed previously and reiterated today  Alexis Frock, MD 09/15/2016, 8:10 AM

## 2016-09-15 NOTE — Anesthesia Preprocedure Evaluation (Addendum)
Anesthesia Evaluation  Patient identified by MRN, date of birth, ID band Patient awake    Reviewed: Allergy & Precautions, NPO status , Patient's Chart, lab work & pertinent test results  Airway Mallampati: II  TM Distance: >3 FB Neck ROM: Limited    Dental   Pulmonary asthma , sleep apnea , pneumonia, former smoker,    breath sounds clear to auscultation       Cardiovascular hypertension, + CAD and + Past MI   Rhythm:Regular Rate:Normal     Neuro/Psych  Headaches,  Neuromuscular disease    GI/Hepatic Neg liver ROS, GERD  ,  Endo/Other  negative endocrine ROS  Renal/GU Renal disease     Musculoskeletal  (+) Arthritis ,   Abdominal   Peds  Hematology   Anesthesia Other Findings   Reproductive/Obstetrics                             Anesthesia Physical Anesthesia Plan  ASA: III  Anesthesia Plan: General   Post-op Pain Management:    Induction: Intravenous  Airway Management Planned: LMA  Additional Equipment:   Intra-op Plan:   Post-operative Plan: Extubation in OR  Informed Consent: I have reviewed the patients History and Physical, chart, labs and discussed the procedure including the risks, benefits and alternatives for the proposed anesthesia with the patient or authorized representative who has indicated his/her understanding and acceptance.   Dental advisory given  Plan Discussed with: CRNA and Anesthesiologist  Anesthesia Plan Comments:         Anesthesia Quick Evaluation d

## 2016-09-16 LAB — BASIC METABOLIC PANEL
Anion gap: 7 (ref 5–15)
BUN: 18 mg/dL (ref 6–20)
CO2: 25 mmol/L (ref 22–32)
Calcium: 8.6 mg/dL — ABNORMAL LOW (ref 8.9–10.3)
Chloride: 103 mmol/L (ref 101–111)
Creatinine, Ser: 0.98 mg/dL (ref 0.61–1.24)
GFR calc Af Amer: >60 mL/min (ref >60)
GFR calc non Af Amer: >60 mL/min (ref >60)
Glucose, Bld: 166 mg/dL — ABNORMAL HIGH (ref 65–99)
Potassium: 4.4 mmol/L (ref 3.5–5.1)
Sodium: 135 mmol/L (ref 135–145)

## 2016-09-16 LAB — HEMOGLOBIN AND HEMATOCRIT, BLOOD
HCT: 38.1 % — ABNORMAL LOW (ref 39.0–52.0)
Hemoglobin: 12.7 g/dL — ABNORMAL LOW (ref 13.0–17.0)

## 2016-09-16 MED ORDER — SENNOSIDES-DOCUSATE SODIUM 8.6-50 MG PO TABS: 1.0000 | ORAL_TABLET | Freq: Two times a day (BID) | ORAL | 0 refills | Status: DC

## 2016-09-16 MED ORDER — CEPHALEXIN 500 MG PO CAPS: 500.0000 mg | ORAL_CAPSULE | Freq: Two times a day (BID) | ORAL | 0 refills | Status: DC

## 2016-09-16 NOTE — Progress Notes (Signed)
1 Day Post-Op  Subjective:  1 - Left UPJ Obstruction - s/p LEFT robotic pyeloplasty 09/15/2016. POD 1 Hgb 12.7, Cr 0.98.   Today "[name]" is improving. Ambulated in hall last night and today. Jp output scant. Pain controlled. No nausea / emesis.   Objective: Vital signs in last 24 hours: Temp:  [97.4 F (36.3 C)-98.6 F (37 C)] 98.2 F (36.8 C) (11/02 0509) Pulse Rate:  [64-84] 66 (11/02 0509) Resp:  [14-20] 15 (11/02 0509) BP: (125-163)/(71-96) 139/86 (11/02 0509) SpO2:  [95 %-100 %] 98 % (11/02 0509) Weight:  [82.6 kg (182 lb)] 82.6 kg (182 lb) (11/01 1054) Last BM Date: 09/15/16  Intake/Output from previous day: 11/01 0701 - 11/02 0700 In: 2736.7 [I.V.:2736.7] Out: 1605 [Urine:1550; Drains:30; Blood:25] Intake/Output this shift: Total I/O In: 813.3 [I.V.:813.3] Out: -   General appearance: alert, cooperative and very vigorous for age Head: Normocephalic, without obvious abnormality, atraumatic Eyes: negative Nose: Nares normal. Septum midline. Mucosa normal. No drainage or sinus tenderness. Throat: lips, mucosa, and tongue normal; teeth and gums normal Neck: supple, symmetrical, trachea midline Back: symmetric, no curvature. ROM normal. No CVA tenderness. Resp: non-labored on minimal [place] O2 Cardio: Nl rate GI: soft, non-tender; bowel sounds normal; no masses,  no organomegaly Male genitalia: normal, foley now with clear urine (was pink-tinged last PM as expected), removed.  Extremities: extremities normal, atraumatic, no cyanosis or edema Pulses: 2+ and symmetric Skin: Skin color, texture, turgor normal. No rashes or lesions Lymph nodes: Cervical, supraclavicular, and axillary nodes normal. Neurologic: Grossly normal Incision/Wound: recent port sites c/d/i. JP with scant serosanguinous output.   Lab Results:   Recent Labs  09/13/16 0942 09/16/16 0324  WBC 4.9  --   HGB 13.4 12.7*  HCT 40.0 38.1*  PLT 274  --    BMET  Recent Labs  09/13/16 0942  09/16/16 0324  NA 142 135  K 4.3 4.4  CL 109 103  CO2 28 25  GLUCOSE 100* 166*  BUN 16 18  CREATININE 0.78 0.98  CALCIUM 9.4 8.6*   PT/INR  Recent Labs  09/13/16 0942  LABPROT 12.3  INR 0.92   ABG No results for input(s): PHART, HCO3 in the last 72 hours.  Invalid input(s): PCO2, PO2  Studies/Results: No results found.  Anti-infectives: Anti-infectives    Start     Dose/Rate Route Frequency Ordered Stop   09/15/16 1050  ceFAZolin (ANCEF) IVPB 2g/100 mL premix     2 g 200 mL/hr over 30 Minutes Intravenous 30 min pre-op 09/15/16 1050 09/15/16 1324      Assessment/Plan:   1 - Left UPJ Obstruction - doing well POD 1. Ambualte, saline lock, reg diet, DC foley. Likely DC JP later today if output remains scant. Goals for discharge discussed, likely this afternoon v. Tomorrow based on current progress.   [facility], Genella Bas 09/16/2016

## 2016-09-16 NOTE — Discharge Summary (Signed)
Physician Discharge Summary  Patient ID: [name] MRN: [id number] DOB/AGE: [birth year] 82 y.o.  Admit date: 09/15/2016 Discharge date: 09/16/2016  Admission Diagnoses: LEFT UPJ obstruction  Discharge Diagnoses:  Active Problems:   UPJ obstruction, congenital   Discharged Condition: good  Hospital Course:   1 - Left UPJ Obstruction - s/p LEFT robotic pyeloplasty 09/15/2016. POD 1 Hgb 12.7, Cr 0.98, and Foley removed. By the afternoon of POD 1 he is ambulatory, pain controlled on PO meds, maintaining good PO intake and felt to be adequate for discharge. JP removed prior to DC as output scant 8 hours after foley removal.    Consults: None  Significant Diagnostic Studies: labs: as per above  Treatments:   LEFT robotic pyeloplasty 09/15/2016  Discharge Exam: Blood pressure 139/86, pulse 66, temperature 98.2 F (36.8 C), temperature source Oral, resp. rate 15, height 5\' 11"  (1.803 m), weight 82.6 kg (182 lb), SpO2 98 %. General appearance: alert, cooperative and appears stated age Eyes: negative Nose: Nares normal. Septum midline. Mucosa normal. No drainage or sinus tenderness. Throat: lips, mucosa, and tongue normal; teeth and gums normal Neck: supple, symmetrical, trachea midline Back: symmetric, no curvature. ROM normal. No CVA tenderness. Resp: Non-labored on room air.  Cardio: Nl rate GI: soft, non-tender; bowel sounds normal; no masses,  no organomegaly Male genitalia: normal Extremities: extremities normal, atraumatic, no cyanosis or edema Pulses: 2+ and symmetric Lymph nodes: Cervical, supraclavicular, and axillary nodes normal. Neurologic: Grossly normal Incision/Wound: recent port sites c/d/i. JP removed and dry dressing applied as <94mL output in 8 hours after foley removed.   Disposition: 01-Home or Self Care     Medication List    STOP taking these medications   aspirin 81 MG tablet   Cyanocobalamin 2500 MCG Subl   ELIQUIS 5 MG Tabs tablet Generic drug:   apixaban   Vitamin D 1000 units capsule     TAKE these medications   albuterol 108 (90 Base) MCG/ACT inhaler Commonly known as:  PROVENTIL HFA Inhale 2 puffs into the lungs every 6 (six) hours as needed for wheezing or shortness of breath.   cephALEXin 500 MG capsule Commonly known as:  KEFLEX Take 1 capsule (500 mg total) by mouth 2 (two) times daily. X 3 days. Begin day before next Urology appointment.   DULoxetine 60 MG capsule Commonly known as:  CYMBALTA Take 60 mg by mouth every morning.   ezetimibe 10 MG tablet Commonly known as:  ZETIA Take 5 mg by mouth at bedtime.   fexofenadine 180 MG tablet Commonly known as:  ALLEGRA Take 180 mg by mouth daily as needed for allergies or rhinitis.   lisinopril 5 MG tablet Commonly known as:  PRINIVIL,ZESTRIL TAKE ONE TABLET EACH DAY   LORazepam 0.5 MG tablet Commonly known as:  ATIVAN Take 1-2 tablets (0.5-1 mg total) by mouth 2 (two) times daily. Take 1-2 tablets by mouth daily as needed for anxiety. What changed:  how much to take  when to take this  additional instructions   metoprolol tartrate 25 MG tablet Commonly known as:  LOPRESSOR TAKE ONE AND ONE-HALF TABLET TWICE DAILY What changed:  See the new instructions.   nitroGLYCERIN 0.4 MG SL tablet Commonly known as:  NITROSTAT Place 0.4 mg under the tongue every 5 (five) minutes as needed for chest pain (max 3 pills a day). Reported on 05/13/2016   omeprazole 40 MG capsule Commonly known as:  PRILOSEC TAKE ONE CAPSULE EACH DAY   ORENCIA [place] Inject 1  Applicatorful into the skin See admin instructions. Every 4 weeks Next injection due 09-29-16   oxyCODONE-acetaminophen 5-325 MG tablet Commonly known as:  PERCOCET/ROXICET Take 1-2 tablets by mouth every 6 (six) hours as needed for moderate pain or severe pain. Post-operatively What changed:  reasons to take this   predniSONE 1 MG tablet Commonly known as:  DELTASONE Take 3 mg by mouth every morning.    rosuvastatin 10 MG tablet Commonly known as:  CRESTOR TAKE ONE TABLET EACH DAY What changed:  See the new instructions.   senna-docusate 8.6-50 MG tablet Commonly known as:  Senokot-S Take 1 tablet by mouth 2 (two) times daily. While taking strong pain meds to prevent constipation. What changed:  Another medication with the same name was added. Make sure you understand how and when to take each.   senna-docusate 8.6-50 MG tablet Commonly known as:  Senokot-S Take 1 tablet by mouth 2 (two) times daily. While taking pain meds to prevent constipation. What changed:  You were already taking a medication with the same name, and this prescription was added. Make sure you understand how and when to take each.   SYSTANE BALANCE OP Place 1 drop into both eyes daily as needed (dry eyeS).   zolpidem 10 MG tablet Commonly known as:  AMBIEN Take 1 tablet (10 mg total) by mouth at bedtime as needed. for sleep What changed:  how much to take  reasons to take this  additional instructions      Follow-up Information    Alexis Frock, MD Follow up on 10/05/2016.   Specialty:  Urology Why:  at 9:00 Contact information: [address] [place] [address] [phone]           Signed: Alexis Frock 09/16/2016, 1:46 PM

## 2016-09-16 NOTE — Op Note (Signed)
[name], [name] NO.:  [id number]  MEDICAL RECORD NO.:  [id number]  LOCATION:  [id number]                         FACILITY:  [facility]  PHYSICIAN:  Alexis Frock, MD     DATE OF BIRTH:  [birth year]  DATE OF PROCEDURE: 09/15/2016                              OPERATIVE REPORT   DIAGNOSIS:  Left UPJ obstruction, extrinsic.  PROCEDURE:  Robotic-assisted left pyeloplasty.  ESTIMATED BLOOD LOSS:  Nil.  COMPLICATIONS:  None.  SPECIMEN:  Area of left UPJ obstruction for permanent pathology.  FINDINGS: 1. Extrinsic compression of left UPJ from what appears to be scarified     extrinsic bands of tissue, nonvascular. 2. Successful dismembered pyeloplasty with excision of approximately 1     cm scarified UPJ segment with maintenance of in situ left ureteral     stent proximal in renal pelvis.  ASSISTANT:  Debbrah Alar, PA.  DRAINS: 1. Jackson-Pratt drain to bulb suction. 2. Foley catheter straight drain.  INDICATION:  [name] is a very pleasant and quite vigorous 82 year old gentleman, who was found on workup to have recurrent left flank pain to have hydronephrosis without ureteronephrosis consistent with possible UPJ obstruction.  There was some question of lateral nephrolithiasis as well as some question of intrinsic versus extrinsic obstruction as there was no overt crossing vessel on his actual imaging.  He additionally underwent diagnostic left ureteroscopy last month, which revealed likely extrinsic compression as etiology.  Options were discussed including chronic stenting versus nontreatment versus pyeloplasty, but without minimally invasive assistance, and he wished to proceed with minimal invasive left pyeloplasty.  Informed consent was obtained, placed in the medical record.  PROCEDURE IN DETAIL:  The patient being, [name], verified. Procedure being robotic left pyeloplasty was confirmed.  Procedure was carried out.  Time-out was performed.   Intravenous antibiotics were administered.  General endotracheal anesthesia was induced.  The patient was placed into a left side up full flank position, applying 15 degrees of stable flexion with a superior arm elevator, axillary roll, sequential compression devices, bottom leg bent, top leg straight.  He was further fashioned on the operating table using beanbag and a 3-inch tape over foam padding across his supraxiphoid chest and his pelvis. Sterile field was created by first clipper shaving and then using chlorhexidine gluconate to prep his entire left flank and abdomen.  A Foley catheter had been placed before clipping lateral.  A high-flow, low-pressure pneumoperitoneum was obtained using Veress technique in the left lower quadrant having passed the aspiration and drop test.  An 8-mm robotic camera port was placed in position approximately 1 handbreadth superior lateral to the umbilicus.  Laparoscopic examination of the peritoneal cavity revealed no major adhesions or visceral injury. Additional ports were then placed as follows:  Left subcostal 8-mm robotic port, left paramedian inferior 8-mm robotic port approximately 1 handbreadth superior to the pubic ramus, left far lateral 8-mm robotic port approximately a handbreadth superior medial to the anterior iliac spine.  A 12-mm assistant port in the midline in the supraumbilical crease and a 5-mm assistant port in the midline approximately 2 fingerbreadths below the camera port.  Robot was docked and passed through  the electronic checks.  Initial attention was directed at development of the retroperitoneum.  An incision was made lateral to the descending colon from the area of the splenic flexure towards the area of the internal ring.  It was carefully mobilized medially along the anterior surface of Gerota's fascia.  The lower pole of the kidney area was identified, placed in gentle lateral traction.  Dissection proceeded medial to  this.  The gonadal vein and ureter were encountered.  The ureter was stented as expected.  The gonadal vein was carefully swept medially.  The ureter was carefully circumferentially mobilized towards the area of the ureteropelvic junction.  At the area of the UPJ, there was an obviously visible step-off in caliber with a focal area of narrowing with dense scar, 5 bands of tissue around this area consistent with extrinsic compression.  There were no obvious crossing arteries or veins.  This segment did appear quite narrow and adynamic.  It was felt that a dismembered approach would be most advantageous.  As such, approximately 1.5 cm of ureter encompassing the narrowed area was carefully excised keeping the situ ureteral stent in adequate position. This was passed off for permanent pathology, labeled as such.  The ureter was spatulated for a distance of approximately 1.5 cm as was the area of the inferior renal pelvis.  Given the patient's known severe hydro, there was no indication for tailoring of the renal pelvis.  A heel stitch of 3-0 Monocryl was applied and then 2 separate suture lines of running 3-0 Monocryl were used in a running fashion, 1 in the anterior wall, in the posterior wall, bringing especially the ureter and inferior renal pelvis into a tension-free apposition with stent in situ. The small segment free end of the inferior renal pelvis which was only approximately 2 cm in length was then closed using running V-Loc suture. This resulted in excellent mucosal apposition and complete resolution of all extrinsic compression.  At this point, all sponge and needle counts were correct.  Hemostasis appeared excellent.  There was no obvious urine leak.  The colon was laid again across the area of the Gerota's fascia back in apposition.  Close suction drain was brought through the left lateral most robotic port site near the peritoneal cavity.  Robot was then undocked.  The previous  12-mm assistant port was closed at the level of the fascia using Carter-Thomason suture passer and 0 Vicryl. All incision sites were infiltrated with dilute lyophilized Marcaine and closed at the level of the skin using subcuticular Monocryl followed by Dermabond and the procedure terminated.  The patient tolerated procedure well.  There were no immediate periprocedural complications.  The patient was taken to the postanesthesia care in stable condition.  Please note, physician assistant, Debbrah Alar, was crucial for all aspects of the robotic portion of the procedure providing invaluable retraction, assistance with suture passage, clipping, and as bedside assistant during all robotic portions.          ______________________________ Alexis Frock, MD     TM/MEDQ  D:  09/15/2016  T:  09/16/2016  Job:  [id number]

## 2016-09-16 NOTE — Progress Notes (Signed)
Patient ambulate on hall way  twice this shift. Tolerated well.

## 2016-09-17 ENCOUNTER — Emergency Department (HOSPITAL_COMMUNITY): Payer: Medicare Other

## 2016-09-17 ENCOUNTER — Encounter (HOSPITAL_COMMUNITY): Payer: Self-pay | Admitting: *Deleted

## 2016-09-17 ENCOUNTER — Emergency Department (HOSPITAL_COMMUNITY)
Admission: EM | Admit: 2016-09-17 | Discharge: 2016-09-17 | Disposition: A | Discharge status: Home / Self Care | Payer: Medicare Other | Attending: Emergency Medicine | Admitting: Emergency Medicine

## 2016-09-17 DIAGNOSIS — I2510 Atherosclerotic heart disease of native coronary artery without angina pectoris: Secondary | ICD-10-CM | POA: Insufficient documentation

## 2016-09-17 DIAGNOSIS — Z87891 Personal history of nicotine dependence: Secondary | ICD-10-CM | POA: Insufficient documentation

## 2016-09-17 DIAGNOSIS — R1032 Left lower quadrant pain: Secondary | ICD-10-CM | POA: Diagnosis not present

## 2016-09-17 DIAGNOSIS — Z79899 Other long term (current) drug therapy: Secondary | ICD-10-CM | POA: Diagnosis not present

## 2016-09-17 DIAGNOSIS — G8918 Other acute postprocedural pain: Secondary | ICD-10-CM

## 2016-09-17 DIAGNOSIS — I10 Essential (primary) hypertension: Secondary | ICD-10-CM | POA: Diagnosis not present

## 2016-09-17 DIAGNOSIS — N1330 Unspecified hydronephrosis: Secondary | ICD-10-CM | POA: Diagnosis not present

## 2016-09-17 LAB — COMPREHENSIVE METABOLIC PANEL
ALT: 15 U/L — ABNORMAL LOW (ref 17–63)
AST: 20 U/L (ref 15–41)
Albumin: 4 g/dL (ref 3.5–5.0)
Alkaline Phosphatase: 45 U/L (ref 38–126)
Anion gap: 7 (ref 5–15)
BUN: 22 mg/dL — ABNORMAL HIGH (ref 6–20)
CO2: 23 mmol/L (ref 22–32)
Calcium: 8.8 mg/dL — ABNORMAL LOW (ref 8.9–10.3)
Chloride: 107 mmol/L (ref 101–111)
Creatinine, Ser: 1.39 mg/dL — ABNORMAL HIGH (ref 0.61–1.24)
GFR calc Af Amer: 53 mL/min — ABNORMAL LOW (ref >60)
GFR calc non Af Amer: 46 mL/min — ABNORMAL LOW (ref >60)
Glucose, Bld: 148 mg/dL — ABNORMAL HIGH (ref 65–99)
Potassium: 3.9 mmol/L (ref 3.5–5.1)
Sodium: 137 mmol/L (ref 135–145)
Total Bilirubin: 1.1 mg/dL (ref 0.3–1.2)
Total Protein: 7.3 g/dL (ref 6.5–8.1)

## 2016-09-17 LAB — URINALYSIS, ROUTINE W REFLEX MICROSCOPIC
Bilirubin Urine: NEGATIVE
Glucose, UA: NEGATIVE mg/dL
Ketones, ur: NEGATIVE mg/dL
Leukocytes, UA: NEGATIVE
Nitrite: NEGATIVE
Protein, ur: NEGATIVE mg/dL
Specific Gravity, Urine: 1.017 (ref 1.005–1.030)
pH: 5.5 (ref 5.0–8.0)

## 2016-09-17 LAB — CBC WITH DIFFERENTIAL/PLATELET
Basophils Absolute: 0 10*3/uL (ref 0.0–0.1)
Basophils Relative: 0 %
Eosinophils Absolute: 0 10*3/uL (ref 0.0–0.7)
Eosinophils Relative: 0 %
HCT: 39.9 % (ref 39.0–52.0)
Hemoglobin: 13.7 g/dL (ref 13.0–17.0)
Lymphocytes Relative: 10 %
Lymphs Abs: 1.1 10*3/uL (ref 0.7–4.0)
MCH: 33.3 pg (ref 26.0–34.0)
MCHC: 34.3 g/dL (ref 30.0–36.0)
MCV: 96.8 fL (ref 78.0–100.0)
Monocytes Absolute: 0.6 10*3/uL (ref 0.1–1.0)
Monocytes Relative: 5 %
Neutro Abs: 10.1 10*3/uL — ABNORMAL HIGH (ref 1.7–7.7)
Neutrophils Relative %: 85 %
Platelets: 243 10*3/uL (ref 150–400)
RBC: 4.12 MIL/uL — ABNORMAL LOW (ref 4.22–5.81)
RDW: 13.6 % (ref 11.5–15.5)
WBC: 11.8 10*3/uL — ABNORMAL HIGH (ref 4.0–10.5)

## 2016-09-17 LAB — LIPASE, BLOOD: Lipase: 23 U/L (ref 11–51)

## 2016-09-17 LAB — URINE MICROSCOPIC-ADD ON: Squamous Epithelial / LPF: NONE SEEN

## 2016-09-17 MED ORDER — SODIUM CHLORIDE 0.9 % IV SOLN
INTRAVENOUS | Status: DC
  Administered 2016-09-17: 03:00:00 via INTRAVENOUS

## 2016-09-17 MED ORDER — MORPHINE SULFATE (PF) 2 MG/ML IV SOLN
6.0000 mg | Freq: Once | INTRAVENOUS | Status: AC
  Administered 2016-09-17: 6 mg via INTRAVENOUS
  Filled 2016-09-17: qty 3

## 2016-09-17 NOTE — ED Notes (Signed)
Pt in CT.

## 2016-09-17 NOTE — ED Triage Notes (Signed)
Patient is alert and oriented x4.  He is complaining of post op problems.  Patient was DC today from [facility] after a renal surgery.  Patient is returning for pain that he rates 10 of 10.

## 2016-09-17 NOTE — ED Provider Notes (Signed)
[facility] DEPT Provider Note   CSN: [id number] Arrival date & time: 09/17/16  0023  By signing my name below, I, [name], attest that this documentation has been prepared under the direction and in the presence of Everlene Balls, MD. Electronically Signed: Soijett Todd, ED Scribe. 09/17/16. 1:58 AM.   History   Chief Complaint Chief Complaint  Patient presents with  . Post-op Problem    HPI [name] is a 82 y.o. male with a PMHx of left hydronephrosis, HTN, hyperlipidemia, who presents to the Emergency Department complaining of post-op problem onset yesterday. Pt notes that he was d/c from [facility] yesterday following renal surgery with stent placement. Pt reports that he has a follow up appointment with his surgeon in 3 weeks. He states that he is having associated symptoms of LLQ abdominal pain, lower back pain, and left leg pain worsened with movement. Pt notes that he his LLQ abdominal pain radiates to his lower back and left leg. He states that he has tried Rx percocet with his last dose being at 11:30 PM yesterday for the relief for his symptoms. He denies hematuria and any other symptoms.  Per pt chart review: Pt had robot assisted pyeloplasty completed on 09/15/2016. Pt was Rx keflex, percocet, and senokot for their symptoms. Pt was informed to follow up with his surgeon on 10/05/2016.  The history is provided by the patient and the spouse. No language interpreter was used.    Past Medical History:  Diagnosis Date  . CAD (coronary artery disease) 12/2013--  cardiologist-  dr allred/ dr turner   s/p inferior STEMI 01/08/2014; [place] 01/08/14: total RCA occlusion s/p 3.5x10mm Xience DES distal RCA and 3.5x28 mm DES mid RCA, 60-70% mid LAD stenosis, EF 55%  . Complication of anesthesia    'long to wake up after back surgery " 09/2003  . Diverticulosis of colon   . GERD (gastroesophageal reflux disease)   . History of cellulitis    05-26-2015  LLE  . History of colon  polyps    1998- benign/  2008 adenomatous   . History of kidney stones    2013  . History of squamous cell carcinoma excision    2013;  2015;  06-12-2015 right leg/  02/ and 05/ 2017  left ear and left leg  . History of ST elevation myocardial infarction (STEMI) 01/08/2014   acute infertor--  s/p  DES x2 to RCA  . Hydronephrosis, left   . Hyperlipidemia   . Hypertension   . Migraine with aura   . OSA on CPAP 06/19/2015   Moderate OSA with AHI 18/hr  per study 05-20-2015  . Osteoarthritis   . Palpitations   . Paroxysmal atrial fibrillation ([place]) 4/16   chads2vasc score is at least 4  . Premature atrial contractions   . RA (rheumatoid arthritis) [facility])    rheumatologist-  dr Leigh Aurora  . S/P drug eluting coronary stent placement 01/08/2014   x2  to mid and distal RCA  . Sinusitis, chronic 10/17/2015  . Wears glasses   . Wears hearing aid    bilateral    Patient Active Problem List   Diagnosis Date Noted  . UPJ obstruction, 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  . Allergic rhinitis 06/02/2016  . Asthma with exacerbation 06/02/2016  . Sinusitis, chronic 10/17/2015  . OSA (obstructive sleep apnea) 06/19/2015  . Cellulitis and abscess of leg, except foot 05/26/2015  . Paroxysmal atrial fibrillation ([place]) 02/21/2015  . CAP (community acquired pneumonia) 01/02/2015  .  Vivid dream 09/25/2014  . Actinic keratoses 04/26/2014  . CAD (coronary artery disease) 02/25/2014  . STEMI (ST elevation myocardial infarction) ([place]) 01/08/2014  . Rheumatoid bursitis of left elbow ([place]) 08/08/2013  . Situational depression 06/18/2013  . Neck pain 04/23/2012  . Acute abdominal pain in left flank 02/14/2012  . Hypertension 09/23/2011  . Chest pain, midsternal 09/13/2011  . Mixed hyperlipidemia 07/23/2010  . Palpitations 07/07/2010  . Rheumatoid arthritis ([place]) 06/04/2010  . PARESTHESIA 06/04/2010  . ARTHRALGIA 11/27/2009  . MYALGIA 08/21/2009  . ALLERGIC RHINITIS 06/11/2008  . OSTEOARTHRITIS 06/11/2008    . FATIGUE 06/11/2008  . ABNORMAL GLUCOSE NEC 02/06/2008  . LOW BACK PAIN 07/27/2007  . COLONIC POLYPS, HX OF 07/27/2007    Past Surgical History:  Procedure Laterality Date  . BACK SURGERY    . CARDIOVASCULAR STRESS TEST  11/21/2015   Low risk nuclear study w/ a small diaphragmatic attenuation artifact, no ischemia/  normal LV function and wall motion , ef 63%  . CATARACT EXTRACTION W/ INTRAOCULAR LENS  IMPLANT, BILATERAL  2015  . COLONOSCOPY  last one 06-09-2011  . CORONARY ANGIOPLASTY    . CYSTOSCOPY W/ RETROGRADES Left 07/12/2016   Procedure: CYSTOSCOPY WITH RETROGRADE PYELOGRAM LEFT URETERAL STENT;  Surgeon: Irine Seal, MD;  Location: [facility];  Service: Urology;  Laterality: Left;  . CYSTOSCOPY WITH RETROGRADE PYELOGRAM, URETEROSCOPY AND STENT PLACEMENT Left 08/11/2016   Procedure: CYSTOSCOPY WITH RETROGRADE PYELOGRAM,  DIAGNOSTIC URETEROSCOPY , STENT EXCHANGE;  Surgeon: Alexis Frock, MD;  Location: [facility];  Service: Urology;  Laterality: Left;  . 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   severe fibromatosis palm and fingers  . LEFT HEART CATHETERIZATION WITH CORONARY ANGIOGRAM N/A 01/08/2014   Procedure: LEFT HEART CATHETERIZATION WITH CORONARY ANGIOGRAM;  Surgeon: Troy Sine, MD;  Location: [facility] CATH LAB;  Service: Cardiovascular;  Laterality:N/A;  total/ subtotal RCA/  mLAD [id number] w/ mid systolic bridging/  preserved global LVF, ef 55%  . MOHS SURGERY  x2  feb and may 2017   left ear /  left leg  ([facility])  . PERCUTANEOUS CORONARY STENT INTERVENTION (PCI-S)  01/08/2014   Procedure: PERCUTANEOUS CORONARY STENT INTERVENTION (PCI-S);  Surgeon: Troy Sine, MD;  Location: [facility] CATH LAB;  Service: Cardiovascular;;  DES to mid and distal RCA  . POSTERIOR LUMBAR FUSION  10/01/2003   and Laminectomy/ diskectomy  L4 -- S1  . ROBOT ASSISTED PYELOPLASTY Left 09/15/2016   Procedure: XI ROBOTIC ASSISTED PYELOPLASTY;  Surgeon: Alexis Frock, MD;  Location: [facility];   Service: Urology;  Laterality: Left;  . TONSILLECTOMY    . TRANSTHORACIC ECHOCARDIOGRAM  02/23/2016   mild LVH,  ef 50-55%,  grade 1 diastolic dysfunction/  mild to moderate AV calcification w/ no stenosis or regurg./  trivial MR and TR/  mild PR       Home Medications    Prior to Admission medications   Medication Sig Start Date End Date Taking? Authorizing Provider  Abatacept (ORENCIA [place]) Inject 1 Applicatorful into the skin See admin instructions. Every 4 weeks Next injection due 09-29-16   Yes Historical Provider, MD  albuterol (PROVENTIL HFA) 108 (90 Base) MCG/ACT inhaler Inhale 2 puffs into the lungs every 6 (six) hours as needed for wheezing or shortness of breath. 05/13/16  Yes Burnard Hawthorne, FNP  DULoxetine (CYMBALTA) 60 MG capsule Take 60 mg by mouth every morning.    Yes Historical Provider, MD  ELIQUIS 5 MG TABS tablet Take 1 tablet by mouth 2 (  two) times daily. 09/06/16  Yes Historical Provider, MD  ezetimibe (ZETIA) 10 MG tablet Take 5 mg by mouth at bedtime.    Yes Historical Provider, MD  fexofenadine (ALLEGRA) 180 MG tablet Take 180 mg by mouth daily as needed for allergies or rhinitis.    Yes Historical Provider, MD  lisinopril (PRINIVIL,ZESTRIL) 5 MG tablet TAKE ONE TABLET EACH DAY 06/10/16  Yes Thompson Grayer, MD  LORazepam (ATIVAN) 0.5 MG tablet Take 1-2 tablets (0.5-1 mg total) by mouth 2 (two) times daily. Take 1-2 tablets by mouth daily as needed for anxiety. Patient taking differently: Take 0.5 mg by mouth at bedtime.  07/29/16  Yes Evie Lacks Plotnikov, MD  metoprolol tartrate (LOPRESSOR) 25 MG tablet TAKE ONE AND ONE-HALF TABLET TWICE DAILY Patient taking differently: TAKE ONE AND ONE-HALF TABLET TWICE DAILY- (total 37.5mg ) 05/31/16  Yes Thompson Grayer, MD  nitroGLYCERIN (NITROSTAT) 0.4 MG SL tablet Place 0.4 mg under the tongue every 5 (five) minutes as needed for chest pain (max 3 pills a day). Reported on 05/13/2016   Yes Historical Provider, MD  omeprazole  (PRILOSEC) 40 MG capsule TAKE ONE CAPSULE EACH DAY 08/30/16  Yes Cassandria Anger, MD  oxyCODONE-acetaminophen (PERCOCET/ROXICET) 5-325 MG tablet Take 1-2 tablets by mouth every 6 (six) hours as needed for moderate pain or severe pain. Post-operatively 09/15/16  Yes Debbrah Alar, PA-C  predniSONE (DELTASONE) 1 MG tablet Take 3 mg by mouth every morning.  06/10/16  Yes Historical Provider, MD  Propylene Glycol (SYSTANE BALANCE OP) Place 1 drop into both eyes daily as needed (dry eyeS).    Yes Historical Provider, MD  rosuvastatin (CRESTOR) 10 MG tablet TAKE ONE TABLET EACH DAY Patient taking differently: TAKE ONE TABLET EACH DAY AT NIGHT 05/21/16  Yes Thompson Grayer, MD  zolpidem (AMBIEN) 10 MG tablet Take 1 tablet (10 mg total) by mouth at bedtime as needed. for sleep Patient taking differently: Take 5 mg by mouth at bedtime as needed for sleep. for sleep 07/28/16  Yes Evie Lacks Plotnikov, MD  cephALEXin (KEFLEX) 500 MG capsule Take 1 capsule (500 mg total) by mouth 2 (two) times daily. X 3 days. Begin day before next Urology appointment. 09/16/16   Alexis Frock, MD  senna-docusate (SENOKOT-S) 8.6-50 MG tablet Take 1 tablet by mouth 2 (two) times daily. While taking strong pain meds to prevent constipation. Patient not taking: Reported on 09/09/2016 08/11/16   Alexis Frock, MD  senna-docusate (SENOKOT-S) 8.6-50 MG tablet Take 1 tablet by mouth 2 (two) times daily. While taking pain meds to prevent constipation. 09/16/16   Alexis Frock, MD    Family History Family History  Problem Relation Age of Onset  . Hypertension Mother   . Heart disease Father   . Colon cancer Neg Hx     Social History Social History  Substance Use Topics  . Smoking status: Former Smoker    Years: 10.00    Types: Cigarettes    Quit date: 08/09/1968  . Smokeless tobacco: Never Used  . Alcohol use 4.2 - 8.4 oz/week    7 - 14 Glasses of wine per week     Comment: 1-2 glasses of wine nightly     Allergies     Methotrexate; Lisinopril; Atorvastatin; and Pravastatin sodium   Review of Systems Review of Systems A complete 10 system review of systems was obtained and all systems are negative except as noted in the HPI and PMH.   Physical Exam Updated Vital Signs BP 128/73   Pulse  80   Temp 97.6 F (36.4 C) (Oral)   Resp 18   Ht 5\' 11"  (1.803 m)   Wt 182 lb (82.6 kg)   SpO2 95%   BMI 25.38 kg/m   Physical Exam  Constitutional: He is oriented to person, place, and time. Vital signs are normal. He appears well-developed and well-nourished.  Non-toxic appearance. He does not appear ill. No distress.  HENT:  Head: Normocephalic and atraumatic.  Nose: Nose normal.  Mouth/Throat: Oropharynx is clear and moist. No oropharyngeal exudate.  Eyes: Conjunctivae and EOM are normal. Pupils are equal, round, and reactive to light. No scleral icterus.  Neck: Normal range of motion. Neck supple. No tracheal deviation, no edema, no erythema and normal range of motion present. No thyroid mass and no thyromegaly present.  Cardiovascular: Normal rate, regular rhythm, S1 normal, S2 normal, normal heart sounds, intact distal pulses and normal pulses.  Exam reveals no gallop and no friction rub.   No murmur heard. Pulmonary/Chest: Effort normal and breath sounds normal. No respiratory distress. He has no wheezes. He has no rhonchi. He has no rales.  Abdominal: Soft. Normal appearance and bowel sounds are normal. He exhibits no distension, no ascites and no mass. There is no hepatosplenomegaly. There is tenderness in the left lower quadrant. There is no rebound, no guarding and no CVA tenderness.  Surgical sites are well healed. No sign of infection. Mild LLQ TTP.   Musculoskeletal: Normal range of motion. He exhibits no edema or tenderness.  Lymphadenopathy:    He has no cervical adenopathy.  Neurological: He is alert and oriented to person, place, and time. He has normal strength. No cranial nerve deficit or  sensory deficit.  Skin: Skin is warm, dry and intact. No petechiae and no rash noted. He is not diaphoretic. No erythema. No pallor.  Nursing note and vitals reviewed.   ED Treatments / Results  DIAGNOSTIC STUDIES: Oxygen Saturation is 98% on RA, nl by my interpretation.    COORDINATION OF CARE: 1:50 AM Discussed treatment plan with pt at bedside which includes labs, CT scan and pt agreed to plan.   Labs (all labs ordered are listed, but only abnormal results are displayed) Labs Reviewed  CBC WITH DIFFERENTIAL/PLATELET - Abnormal; Notable for the following:       Result Value   WBC 11.8 (*)    RBC 4.12 (*)    Neutro Abs 10.1 (*)    All other components within normal limits  COMPREHENSIVE METABOLIC PANEL - Abnormal; Notable for the following:    Glucose, Bld 148 (*)    BUN 22 (*)    Creatinine, Ser 1.39 (*)    Calcium 8.8 (*)    ALT 15 (*)    GFR calc non Af Amer 46 (*)    GFR calc Af Amer 53 (*)    All other components within normal limits  URINALYSIS, ROUTINE W REFLEX MICROSCOPIC (NOT AT [facility]) - Abnormal; Notable for the following:    Hgb urine dipstick MODERATE (*)    All other components within normal limits  URINE MICROSCOPIC-ADD ON - Abnormal; Notable for the following:    Bacteria, UA RARE (*)    All other components within normal limits  URINE CULTURE  LIPASE, BLOOD    EKG  EKG Interpretation None       Radiology Ct Renal Stone Study  Result Date: 09/17/2016 CLINICAL DATA:  Acute onset of left lower quadrant abdominal pain and left flank pain. Status post recent  pyeloplasty and left ureteral stent placement. Initial encounter. EXAM: CT ABDOMEN AND PELVIS WITHOUT CONTRAST TECHNIQUE: Multidetector CT imaging of the abdomen and pelvis was performed following the standard protocol without IV contrast. COMPARISON:  CT of the abdomen and pelvis from 09/10/2016 FINDINGS: Lower chest: Bibasilar atelectasis is noted. Diffuse coronary artery calcifications are seen.  Hepatobiliary: The liver is unremarkable in appearance. The gallbladder is unremarkable in appearance. The common bile duct remains normal in caliber. Pancreas: The pancreas is within normal limits. Spleen: The spleen is unremarkable in appearance. Adrenals/Urinary Tract: The adrenal glands are unremarkable in appearance. Minimal left-sided hydronephrosis is noted, with a left ureteral stent noted in expected position. Trace postoperative air is noted at the left renal pelvis. Nonspecific perinephric stranding is noted bilaterally. A parenchymal calcification is noted at the left kidney, and a nonobstructing 4 mm stone is noted at the interpole region of the left kidney. No obstructing ureteral stone is seen. Stomach/Bowel: The stomach is unremarkable in appearance. The small bowel is within normal limits. The appendix is normal in caliber, without evidence of appendicitis. The colon is unremarkable in appearance. Vascular/Lymphatic: Diffuse calcification is seen along the abdominal aorta and its branches. The abdominal aorta is otherwise grossly unremarkable. The inferior vena cava is grossly unremarkable. No retroperitoneal lymphadenopathy is seen. No pelvic sidewall lymphadenopathy is identified. Reproductive: The bladder is moderately distended. A small amount of air within the bladder reflects the recent procedure. The prostate is enlarged, measuring 6.2 cm in transverse dimension, with minimal calcification. Other: A large amount of soft tissue air is noted along the abdominal wall, and also surrounding the left kidney, tracking along Gerota's fascia. Fluid tracks over the left iliopsoas, with associated air, extending into the pelvis. Air tracks along the inguinal regions bilaterally, and anterior to the penile shaft. On correlation with the operative note, this appears to reflect the placement of multiple ports during surgery, in each of these regions. Musculoskeletal: No acute osseous abnormalities are  identified. Disc space narrowing is noted at L2-L3, with associated endplate sclerotic change. The patient is status post interbody fusion at L4-L5 and L5-S1. There is mild grade 1 anterolisthesis of L4 on L5. The visualized musculature is unremarkable in appearance. IMPRESSION: 1. Minimal left-sided hydronephrosis, with left ureteral stent noted in expected position. 2. Fluid tracks over the left iliopsoas, with associated air, extending into the pelvis. This may reflect sequelae of hydronephrosis prior to the time of surgery. 3. Large amount of soft tissue air along the abdominal wall, surrounding the left kidney, extending along the inguinal regions bilaterally, and anterior to the penile shaft. This appears to reflect the placement of multiple ports during surgery, in each of these regions, on correlation with the operative note. 4. Bibasilar atelectasis noted. 5. Diffuse coronary artery calcifications seen. 6. Diffuse aortic atherosclerosis noted. 7. Enlarged prostate seen. 8. Mild degenerative change along the lumbar spine, with interbody fusion at L4-S1. Electronically Signed   By: Garald Balding M.D.   On: 09/17/2016 03:47    Procedures Procedures (including critical care time)  Medications Ordered in ED Medications  0.9 %  sodium chloride infusion ( Intravenous New Bag/Given 09/17/16 0242)  morphine 2 MG/ML injection 6 mg (6 mg Intravenous Given 09/17/16 0226)  morphine 2 MG/ML injection 6 mg (6 mg Intravenous Given 09/17/16 0351)     Initial Impression / Assessment and Plan / ED Course  I have reviewed the triage vital signs and the nursing notes.  Pertinent labs & imaging results that  were available during my care of the patient were reviewed by me and considered in my medical decision making (see chart for details).  Clinical Course    Patient presents to the ED for worsening LLQ and L flank pain after recent surgery.  He may have an obstruction again vs a UTI causing this.  Will  obtain labs, given fluids/morphine, and obtain CT to reevaluate his kidneys.     4:45 AM CT is negative for an acute cause of his pain.  UA also only reveals rare bacteria.  Culture sent.  I did give give abx for this.  After morphine x2 patients pain is improved but does get worse with movement.  I feel this is normal pain from postop care.  He was advised on a regimen for his ibuprofen and percocet.  HE will call his urologist in the morning.  He appears well and in NAD. VS remain within his normal limits and he is safe for DC.  Final Clinical Impressions(s) / ED Diagnoses   Final diagnoses:  None    New Prescriptions New Prescriptions   No medications on file   I personally performed the services described in this documentation, which was scribed in my presence. The recorded information has been reviewed and is accurate.       Everlene Balls, MD 09/17/16 [phone]

## 2016-09-18 LAB — URINE CULTURE: Culture: NO GROWTH

## 2016-09-27 ENCOUNTER — Other Ambulatory Visit: Payer: Self-pay | Admitting: Internal Medicine

## 2016-09-29 DIAGNOSIS — M0609 Rheumatoid arthritis without rheumatoid factor, multiple sites: Secondary | ICD-10-CM | POA: Diagnosis not present

## 2016-10-05 DIAGNOSIS — N1330 Unspecified hydronephrosis: Secondary | ICD-10-CM | POA: Diagnosis not present

## 2016-10-05 DIAGNOSIS — N200 Calculus of kidney: Secondary | ICD-10-CM | POA: Diagnosis not present

## 2016-10-27 DIAGNOSIS — M0609 Rheumatoid arthritis without rheumatoid factor, multiple sites: Secondary | ICD-10-CM | POA: Diagnosis not present

## 2016-10-29 DIAGNOSIS — L720 Epidermal cyst: Secondary | ICD-10-CM | POA: Diagnosis not present

## 2016-10-29 DIAGNOSIS — L723 Sebaceous cyst: Secondary | ICD-10-CM | POA: Diagnosis not present

## 2016-10-29 DIAGNOSIS — L821 Other seborrheic keratosis: Secondary | ICD-10-CM | POA: Diagnosis not present

## 2016-10-29 DIAGNOSIS — L570 Actinic keratosis: Secondary | ICD-10-CM | POA: Diagnosis not present

## 2016-10-29 DIAGNOSIS — Z85828 Personal history of other malignant neoplasm of skin: Secondary | ICD-10-CM | POA: Diagnosis not present

## 2016-11-17 ENCOUNTER — Other Ambulatory Visit: Payer: Self-pay | Admitting: Internal Medicine

## 2016-11-17 NOTE — Progress Notes (Signed)
Pre visit review using our clinic review tool, if applicable. No additional management support is needed unless otherwise documented below in the visit note. 

## 2016-11-17 NOTE — Progress Notes (Signed)
Subjective:   [name] is a 83 y.o. male who presents for an Initial Medicare Annual Wellness Visit.  The Patient was informed that the wellness visit is to identify future health risk and educate and initiate measures that can reduce risk for increased disease through the lifespan.   Review of Systems  No ROS.  Medicare Wellness Visit.  Cardiac Risk Factors include: advanced age (>68men, >62 women);dyslipidemia;family history of premature cardiovascular disease;hypertension;male gender   Sleep patterns: Sleeps 8 hours, uses CPAP (has not used in last 2 days d/t head congestion) Home Safety/Smoke Alarms: Smoke detectors in place.   Living environment; residence and Firearm Safety: Lives with wife in 2 story home. Planning to move to Assisted Living community in near future. Firearms locked away.  Seat Belt Safety/Bike Helmet: Wears seat belt.   Counseling:   Eye Exam-Last exam 11/2015, yearly by [facility]. Dental-Last exam 09/2016, every 6 months by Dr. Gloriann Loan   Male:   [facility]-Colonoscopy 06/09/2011  Diverticulosis. Recall 5 years. Aged out.    PSA-06/04/2011, 1.520. Followed by urology.     Objective:    Today's Vitals   11/18/16 1036  BP: 128/80  Pulse: 72  Temp: 97.2 F (36.2 C)  TempSrc: Oral  SpO2: 97%  Weight: 176 lb 3.2 oz (79.9 kg)  Height: 6' (1.829 m)   Body mass index is 23.9 kg/m.  Current Medications (verified) Outpatient Encounter Prescriptions as of 11/18/2016  Medication Sig  . Abatacept (ORENCIA [place]) Inject 1 Applicatorful into the skin See admin instructions. Every 4 weeks Next injection due 09-29-16  . albuterol (PROVENTIL HFA) 108 (90 Base) MCG/ACT inhaler Inhale 2 puffs into the lungs every 6 (six) hours as needed for wheezing or shortness of breath.  [name] apixaban (ELIQUIS) 5 MG TABS tablet Take 1 tablet (5 mg total) by mouth 2 (two) times daily.  . DULoxetine (CYMBALTA) 60 MG capsule Take 60 mg by mouth every morning.   . ezetimibe (ZETIA) 10 MG tablet  Take 5 mg by mouth at bedtime.   . fexofenadine (ALLEGRA) 180 MG tablet Take 180 mg by mouth daily as needed for allergies or rhinitis.   [name] lisinopril (PRINIVIL,ZESTRIL) 5 MG tablet TAKE ONE TABLET EACH DAY  . LORazepam (ATIVAN) 0.5 MG tablet Take 1-2 tablets (0.5-1 mg total) by mouth 2 (two) times daily. Take 1-2 tablets by mouth daily as needed for anxiety. (Patient taking differently: Take 0.5 mg by mouth at bedtime. )  . metoprolol tartrate (LOPRESSOR) 25 MG tablet TAKE ONE AND ONE-HALF TABLET TWICE DAILY (Patient taking differently: TAKE ONE AND ONE-HALF TABLET TWICE DAILY- (total 37.5mg ))  . NITROSTAT 0.4 MG SL tablet ONE TABLET UNDER TONGUE EVERY 5 MINUTES FOR 3 DOSES AS NEEDED FOR CHEST PAIN  . omeprazole (PRILOSEC) 40 MG capsule TAKE ONE CAPSULE EACH DAY  . oxyCODONE-acetaminophen (PERCOCET/ROXICET) 5-325 MG tablet Take 1-2 tablets by mouth every 6 (six) hours as needed for moderate pain or severe pain. Post-operatively  . predniSONE (DELTASONE) 1 MG tablet Take 3 mg by mouth every morning.   [name] Propylene Glycol (SYSTANE BALANCE OP) Place 1 drop into both eyes daily as needed (dry eyeS).   [name] rosuvastatin (CRESTOR) 10 MG tablet TAKE ONE TABLET EACH DAY (Patient taking differently: TAKE ONE TABLET EACH DAY AT NIGHT)  . senna-docusate (SENOKOT-S) 8.6-50 MG tablet Take 1 tablet by mouth 2 (two) times daily. While taking strong pain meds to prevent constipation.  . senna-docusate (SENOKOT-S) 8.6-50 MG tablet Take 1 tablet by mouth  2 (two) times daily. While taking pain meds to prevent constipation.  [name] zolpidem (AMBIEN) 10 MG tablet Take 1 tablet (10 mg total) by mouth at bedtime as needed. for sleep  . [DISCONTINUED] ELIQUIS 5 MG TABS tablet Take 1 tablet by mouth 2 (two) times daily.  . [DISCONTINUED] ELIQUIS 5 MG TABS tablet TAKE ONE TABLET TWICE DAILY  . [DISCONTINUED] oxyCODONE-acetaminophen (PERCOCET/ROXICET) 5-325 MG tablet Take 1-2 tablets by mouth every 6 (six) hours as needed for moderate  pain or severe pain. Post-operatively  . [DISCONTINUED] zolpidem (AMBIEN) 10 MG tablet Take 1 tablet (10 mg total) by mouth at bedtime as needed. for sleep (Patient taking differently: Take 5 mg by mouth at bedtime as needed for sleep. for sleep)  . [DISCONTINUED] cephALEXin (KEFLEX) 500 MG capsule Take 1 capsule (500 mg total) by mouth 2 (two) times daily. X 3 days. Begin day before next Urology appointment.   No facility-administered encounter medications on file as of 11/18/2016.     Allergies (verified) Methotrexate; Lisinopril; Atorvastatin; and Pravastatin sodium   History: Past Medical History:  Diagnosis Date  . CAD (coronary artery disease) 12/2013--  cardiologist-  dr allred/ dr turner   s/p inferior STEMI 01/08/2014; [place] 01/08/14: total RCA occlusion s/p 3.5x65mm Xience DES distal RCA and 3.5x28 mm DES mid RCA, 60-70% mid LAD stenosis, EF 55%  . Complication of anesthesia    'long to wake up after back surgery " 09/2003  . Diverticulosis of colon   . GERD (gastroesophageal reflux disease)   . History of cellulitis    05-26-2015  LLE  . History of colon polyps    1998- benign/  2008 adenomatous   . History of kidney stones    2013  . History of squamous cell carcinoma excision    2013;  2015;  06-12-2015 right leg/  02/ and 05/ 2017  left ear and left leg  . History of ST elevation myocardial infarction (STEMI) 01/08/2014   acute infertor--  s/p  DES x2 to RCA  . Hydronephrosis, left   . Hyperlipidemia   . Hypertension   . Migraine with aura   . OSA on CPAP 06/19/2015   Moderate OSA with AHI 18/hr  per study 05-20-2015  . Osteoarthritis   . Palpitations   . Paroxysmal atrial fibrillation ([place]) 4/16   chads2vasc score is at least 4  . Premature atrial contractions   . RA (rheumatoid arthritis) [facility])    rheumatologist-  dr Leigh Aurora  . S/P drug eluting coronary stent placement 01/08/2014   x2  to mid and distal RCA  . Sinusitis, chronic 10/17/2015  . Wears glasses     . Wears hearing aid    bilateral   Past Surgical History:  Procedure Laterality Date  . BACK SURGERY    . CARDIOVASCULAR STRESS TEST  11/21/2015   Low risk nuclear study w/ a small diaphragmatic attenuation artifact, no ischemia/  normal LV function and wall motion , ef 63%  . CATARACT EXTRACTION W/ INTRAOCULAR LENS  IMPLANT, BILATERAL  2015  . COLONOSCOPY  last one 06-09-2011  . CORONARY ANGIOPLASTY    . CYSTOSCOPY W/ RETROGRADES Left 07/12/2016   Procedure: CYSTOSCOPY WITH RETROGRADE PYELOGRAM LEFT URETERAL STENT;  Surgeon: Irine Seal, MD;  Location: [facility];  Service: Urology;  Laterality: Left;  . CYSTOSCOPY WITH RETROGRADE PYELOGRAM, URETEROSCOPY AND STENT PLACEMENT Left 08/11/2016   Procedure: CYSTOSCOPY WITH RETROGRADE PYELOGRAM,  DIAGNOSTIC URETEROSCOPY , STENT EXCHANGE;  Surgeon: Alexis Frock, MD;  Location:  [place];  Service: Urology;  Laterality: Left;  . 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   severe fibromatosis palm and fingers  . LEFT HEART CATHETERIZATION WITH CORONARY ANGIOGRAM N/A 01/08/2014   Procedure: LEFT HEART CATHETERIZATION WITH CORONARY ANGIOGRAM;  Surgeon: Troy Sine, MD;  Location[facility] CATH LAB;  Service: Cardiovascular;  Laterality:N/A;  total/ subtotal RCA/  mLAD [id number] w/ mid systolic bridging/  preserved global LVF, ef 55%  . MOHS SURGERY  x2  feb and may 2017   left ear /  left leg  ([facility])  . PERCUTANEOUS CORONARY STENT INTERVENTION (PCI-S)  01/08/2014   Procedure: PERCUTANEOUS CORONARY STENT INTERVENTION (PCI-S);  Surgeon: Troy Sine, MD;  Location: [facility] C[facility] LAB;  Service: Cardiovascular;;  DES to mid and distal RCA  . POSTERIOR LUMBAR FUSION  10/01/2003   and Laminectomy/ diskectomy  L4 -- S1  . ROBOT ASSISTED PYELOPLASTY Left 09/15/2016   Procedure: XI ROBOTIC ASSISTED PYELOPLASTY;  Surgeon: Alexis Frock, MD;  Location: [facility];  Service: Urology;  Laterality: Left;  . TONSILLECTOMY    . TRANSTHORACIC ECHOCARDIOGRAM   02/23/2016   mild LVH,  ef 50-55%,  grade 1 diastolic dysfunction/  mild to moderate AV calcification w/ no stenosis or regurg./  trivial MR and TR/  mild PR   Family History  Problem Relation Age of Onset  . Hypertension Mother   . Heart disease Father   . Colon cancer Neg Hx    Social History   Occupational History  . Retired Retired   Social History Main Topics  . Smoking status: Former Smoker    Years: 10.00    Types: Cigarettes    Quit date: 08/09/1968  . Smokeless tobacco: Never Used  . Alcohol use 4.2 - 8.4 oz/week    7 - 14 Glasses of wine per week     Comment: 1-2 glasses of wine nightly  . Drug use: No  . Sexual activity: Yes   Tobacco Counseling Counseling given: Not Answered   Activities of Daily Living In your present state of health, do you have any difficulty performing the following activities: 11/18/2016 09/15/2016  Hearing? N -  Vision? N -  Difficulty concentrating or making decisions? N -  Walking or climbing stairs? N -  Dressing or bathing? N -  Doing errands, shopping? N N  Preparing Food and eating ? N -  Using the Toilet? N -  In the past six months, have you accidently leaked urine? Y -  Do you have problems with loss of bowel control? N -  Managing your Medications? N -  Managing your Finances? N -  Housekeeping or managing your Housekeeping? N -  Some recent data might be hidden    Immunizations and Health Maintenance Immunization History  Administered Date(s) Administered  . Influenza Split 08/10/2011, 08/15/2012  . Influenza Whole 08/21/2009, 09/22/2010  . Influenza, High Dose Seasonal PF 09/16/2015, 08/18/2016  . Influenza,inj,Quad PF,36+ Mos 08/06/2013  . Pneumococcal Conjugate-13 10/02/2013  . Pneumococcal Polysaccharide-23 06/04/2010  . Tdap 10/22/2011   Health Maintenance Due  Topic Date Due  . ZOSTAVAX  08/21/1995  . COLONOSCOPY  06/08/2016    Patient Care Team: Cassandria Anger, MD as PCP - General Irene Shipper, MD  (Gastroenterology) Thompson Grayer, MD (Cardiology) Hennie Duos, MD as Consulting Physician (Rheumatology) Alexis Frock, MD as Consulting Physician (Urology) Katy Apo, MD as Consulting Physician (Ophthalmology) Mathis Fare (Dentistry) Tiajuana Amass, MD as Referring Physician (Allergy and Immunology)  Indicate any recent Medical Services you may have received from other than [facility] providers in the past year (date may be approximate).    Assessment:   This is a routine wellness examination for [name]. Physical assessment deferred to PCP.   Hearing/Vision screen Hearing Screening Comments: Bilateral hearing aids.  Vision Screening Comments: Reading glasses.   Dietary issues and exercise activities discussed: Current Exercise Habits: Structured exercise class, Type of exercise: stretching;walking (walks daily), Time (Minutes): 45, Frequency (Times/Week): 3, Weekly Exercise (Minutes/Week): 135, Intensity: Mild, Exercise limited by: None identified   Diet (meal preparation, eat out, water intake, caffeinated beverages, dairy products, fruits and vegetables): Eats at home mostly. Drinks water.   Breakfast: eggs, whole wheat toast.  Lunch: sandwich Dinner: meat and vegetables. Rarely fried.   Encouraged to continue to make healthy food choices and increase activity as tolerated.   Goals      Patient Stated   . patient states (pt-stated)          Maintain current weight by increasing activity and making healthy food choices.       Depression Screen PHQ 2/9 Scores 11/18/2016 12/26/2014 04/19/2014  PHQ - 2 Score 0 0 0  Exception Documentation - Patient refusal -    Fall Risk Fall Risk  11/18/2016 12/26/2014  Falls in the past year? No Yes  Number falls in past yr: - 1  Injury with Fall? - No    Cognitive Function:       Ad8 score reviewed for issues:  Issues making decisions:no  Less interest in hobbies / activities:no  Repeats questions, stories (family  complaining):no  Trouble using ordinary gadgets (microwave, computer, phone):no  Forgets the month or year: no  Mismanaging finances: no  Remembering appts:no  Daily problems with thinking and/or memory:no Ad8 score is=0     Screening Tests Health Maintenance  Topic Date Due  . ZOSTAVAX  08/21/1995  . COLONOSCOPY  06/08/2016  . TETANUS/TDAP  10/21/2021  . INFLUENZA VACCINE  Addressed  . PNA vac Low Risk Adult  Completed        Plan:     Continue to eat heart healthy diet (full of fruits, vegetables, whole grains, lean protein, water--limit salt, fat, and sugar intake) and increase physical activity as tolerated.  Continue doing brain stimulating activities (puzzles, reading, adult coloring books, staying active) to keep memory sharp.   Bring a copy of your advance directives to your next office visit.    During the course of the visit [name] was educated and counseled about the following appropriate screening and preventive services:   Vaccines to include Pneumoccal, Influenza, Hepatitis B, Td, Zostavax, HCV  Electrocardiogram  Colorectal cancer screening  Cardiovascular disease screening  Diabetes screening  Glaucoma screening  Nutrition counseling  Prostate cancer screening   Patient Instructions (the written plan) were given to the patient.   Gerilyn Nestle, RN   11/18/2016

## 2016-11-18 ENCOUNTER — Encounter: Payer: Self-pay | Admitting: Internal Medicine

## 2016-11-18 ENCOUNTER — Ambulatory Visit (INDEPENDENT_AMBULATORY_CARE_PROVIDER_SITE_OTHER): Payer: Medicare Other | Admitting: Internal Medicine

## 2016-11-18 VITALS — BP 128/80 | HR 72 | Temp 97.2°F | Ht 72.0 in | Wt 176.2 lb

## 2016-11-18 DIAGNOSIS — E782 Mixed hyperlipidemia: Secondary | ICD-10-CM

## 2016-11-18 DIAGNOSIS — I2510 Atherosclerotic heart disease of native coronary artery without angina pectoris: Secondary | ICD-10-CM | POA: Diagnosis not present

## 2016-11-18 DIAGNOSIS — I25119 Atherosclerotic heart disease of native coronary artery with unspecified angina pectoris: Secondary | ICD-10-CM | POA: Diagnosis not present

## 2016-11-18 DIAGNOSIS — F4321 Adjustment disorder with depressed mood: Secondary | ICD-10-CM

## 2016-11-18 DIAGNOSIS — I480 Paroxysmal atrial fibrillation: Secondary | ICD-10-CM

## 2016-11-18 DIAGNOSIS — N1330 Unspecified hydronephrosis: Secondary | ICD-10-CM | POA: Insufficient documentation

## 2016-11-18 DIAGNOSIS — M5440 Lumbago with sciatica, unspecified side: Secondary | ICD-10-CM | POA: Diagnosis not present

## 2016-11-18 DIAGNOSIS — Z0000 Encounter for general adult medical examination without abnormal findings: Secondary | ICD-10-CM

## 2016-11-18 MED ORDER — ZOLPIDEM TARTRATE 10 MG PO TABS: 10.0000 mg | ORAL_TABLET | Freq: Every evening | ORAL | 1 refills | Status: DC | PRN

## 2016-11-18 MED ORDER — OXYCODONE-ACETAMINOPHEN 5-325 MG PO TABS: 1.0000 | ORAL_TABLET | Freq: Four times a day (QID) | ORAL | 0 refills | Status: DC | PRN

## 2016-11-18 MED ORDER — APIXABAN 5 MG PO TABS: 5.0000 mg | ORAL_TABLET | Freq: Two times a day (BID) | ORAL | 3 refills | Status: DC

## 2016-11-18 NOTE — Assessment & Plan Note (Signed)
Dr Tresa Moore Left UPJ Obstruction - s/p LEFT robotic pyeloplasty 09/15/2016

## 2016-11-18 NOTE — Assessment & Plan Note (Addendum)
Zetia; back on Crestor

## 2016-11-18 NOTE — Progress Notes (Addendum)
Subjective:  Patient ID: [name], male    DOB: 1933-11-15  Age: 83 y.o. MRN: [id number]  CC: Annual Exam (c/o head cold x's 5 days. Been taking allegra not really helping. Req refill on Ambien)   HPI [name] presents for a well exam w/Kim F/u RA, HTN, anxiety f/u Left UPJ Obstruction - s/p LEFT robotic pyeloplasty 09/15/2016  Outpatient Medications Prior to Visit  Medication Sig Dispense Refill  . Abatacept (ORENCIA [place]) Inject 1 Applicatorful into the skin See admin instructions. Every 4 weeks Next injection due 09-29-16    . albuterol (PROVENTIL HFA) 108 (90 Base) MCG/ACT inhaler Inhale 2 puffs into the lungs every 6 (six) hours as needed for wheezing or shortness of breath. 1 Inhaler 1  . DULoxetine (CYMBALTA) 60 MG capsule Take 60 mg by mouth every morning.     [name] ELIQUIS 5 MG TABS tablet Take 1 tablet by mouth 2 (two) times daily.  10  . ELIQUIS 5 MG TABS tablet TAKE ONE TABLET TWICE DAILY 60 tablet 6  . ezetimibe (ZETIA) 10 MG tablet Take 5 mg by mouth at bedtime.     . fexofenadine (ALLEGRA) 180 MG tablet Take 180 mg by mouth daily as needed for allergies or rhinitis.     [name] lisinopril (PRINIVIL,ZESTRIL) 5 MG tablet TAKE ONE TABLET EACH DAY 90 tablet 1  . LORazepam (ATIVAN) 0.5 MG tablet Take 1-2 tablets (0.5-1 mg total) by mouth 2 (two) times daily. Take 1-2 tablets by mouth daily as needed for anxiety. (Patient taking differently: Take 0.5 mg by mouth at bedtime. ) 60 tablet 3  . metoprolol tartrate (LOPRESSOR) 25 MG tablet TAKE ONE AND ONE-HALF TABLET TWICE DAILY (Patient taking differently: TAKE ONE AND ONE-HALF TABLET TWICE DAILY- (total 37.5mg )) 270 tablet 3  . NITROSTAT 0.4 MG SL tablet ONE TABLET UNDER TONGUE EVERY 5 MINUTES FOR 3 DOSES AS NEEDED FOR CHEST PAIN 25 tablet 4  . omeprazole (PRILOSEC) 40 MG capsule TAKE ONE CAPSULE EACH DAY 90 capsule 1  . oxyCODONE-acetaminophen (PERCOCET/ROXICET) 5-325 MG tablet Take 1-2 tablets by mouth every 6 (six) hours as needed  for moderate pain or severe pain. Post-operatively 30 tablet 0  . predniSONE (DELTASONE) 1 MG tablet Take 3 mg by mouth every morning.   0  . Propylene Glycol (SYSTANE BALANCE OP) Place 1 drop into both eyes daily as needed (dry eyeS).     [name] rosuvastatin (CRESTOR) 10 MG tablet TAKE ONE TABLET EACH DAY (Patient taking differently: TAKE ONE TABLET EACH DAY AT NIGHT) 30 tablet 5  . senna-docusate (SENOKOT-S) 8.6-50 MG tablet Take 1 tablet by mouth 2 (two) times daily. While taking strong pain meds to prevent constipation. 30 tablet 0  . senna-docusate (SENOKOT-S) 8.6-50 MG tablet Take 1 tablet by mouth 2 (two) times daily. While taking pain meds to prevent constipation. 30 tablet 0  . zolpidem (AMBIEN) 10 MG tablet Take 1 tablet (10 mg total) by mouth at bedtime as needed. for sleep (Patient taking differently: Take 5 mg by mouth at bedtime as needed for sleep. for sleep) 90 tablet 1  . cephALEXin (KEFLEX) 500 MG capsule Take 1 capsule (500 mg total) by mouth 2 (two) times daily. X 3 days. Begin day before next Urology appointment. 6 capsule 0   No facility-administered medications prior to visit.     ROS Review of Systems  Constitutional: Negative for appetite change, fatigue and unexpected weight change.  HENT: Negative for congestion, nosebleeds, sneezing, sore throat  and trouble swallowing.   Eyes: Negative for itching and visual disturbance.  Respiratory: Negative for cough.   Cardiovascular: Negative for chest pain, palpitations and leg swelling.  Gastrointestinal: Negative for abdominal distention, blood in stool, diarrhea and nausea.  Genitourinary: Negative for frequency and hematuria.  Musculoskeletal: Positive for arthralgias. Negative for back pain, gait problem, joint swelling and neck pain.  Skin: Negative for rash.  Neurological: Negative for dizziness, tremors, speech difficulty and weakness.  Psychiatric/Behavioral: Negative for agitation, dysphoric mood and sleep disturbance.  The patient is not nervous/anxious.     Objective:  BP 128/80 (BP Location: Left Arm)   Pulse 72   Temp 97.2 F (36.2 C) (Oral)   Ht 6' (1.829 m)   Wt 176 lb 3.2 oz (79.9 kg)   SpO2 97%   BMI 23.90 kg/m   BP Readings from Last 3 Encounters:  11/18/16 128/80  09/17/16 118/68  09/16/16 129/68    Wt Readings from Last 3 Encounters:  11/18/16 176 lb 3.2 oz (79.9 kg)  09/17/16 182 lb (82.6 kg)  09/15/16 182 lb (82.6 kg)    Physical Exam  Constitutional: He is oriented to person, place, and time. He appears well-developed. No distress.  NAD  HENT:  Mouth/Throat: Oropharynx is clear and moist.  Eyes: Conjunctivae are normal. Pupils are equal, round, and reactive to light.  Neck: Normal range of motion. No JVD present. No thyromegaly present.  Cardiovascular: Normal rate, regular rhythm, normal heart sounds and intact distal pulses.  Exam reveals no gallop and no friction rub.   No murmur heard. Pulmonary/Chest: Effort normal and breath sounds normal. No respiratory distress. He has no wheezes. He has no rales. He exhibits no tenderness.  Abdominal: Soft. Bowel sounds are normal. He exhibits no distension and no mass. There is no tenderness. There is no rebound and no guarding.  Musculoskeletal: Normal range of motion. He exhibits no edema or tenderness.  Lymphadenopathy:    He has no cervical adenopathy.  Neurological: He is alert and oriented to person, place, and time. He has normal reflexes. No cranial nerve deficit. He exhibits normal muscle tone. He displays a negative Romberg sign. Coordination and gait normal.  Skin: Skin is warm and dry. No rash noted.  Psychiatric: He has a normal mood and affect. His behavior is normal. Judgment and thought content normal.    Lab Results  Component Value Date   WBC 11.8 (H) 09/17/2016   HGB 13.7 09/17/2016   HCT 39.9 09/17/2016   PLT 243 09/17/2016   GLUCOSE 148 (H) 09/17/2016   CHOL 145 02/23/2016   TRIG 109 02/23/2016   HDL  56 02/23/2016   LDLDIRECT 168.9 05/24/2013   LDLCALC 67 02/23/2016   ALT 15 (L) 09/17/2016   AST 20 09/17/2016   NA 137 09/17/2016   K 3.9 09/17/2016   CL 107 09/17/2016   CREATININE 1.39 (H) 09/17/2016   BUN 22 (H) 09/17/2016   CO2 23 09/17/2016   TSH 1.015 10/07/2015   PSA 1.52 06/04/2011   INR 0.92 09/13/2016   HGBA1C 6.2 (H) 01/08/2014    Ct Renal Stone Study  Result Date: 09/17/2016 CLINICAL DATA:  Acute onset of left lower quadrant abdominal pain and left flank pain. Status post recent pyeloplasty and left ureteral stent placement. Initial encounter. EXAM: CT ABDOMEN AND PELVIS WITHOUT CONTRAST TECHNIQUE: Multidetector CT imaging of the abdomen and pelvis was performed following the standard protocol without IV contrast. COMPARISON:  CT of the abdomen and pelvis from  09/10/2016 FINDINGS: Lower chest: Bibasilar atelectasis is noted. Diffuse coronary artery calcifications are seen. Hepatobiliary: The liver is unremarkable in appearance. The gallbladder is unremarkable in appearance. The common bile duct remains normal in caliber. Pancreas: The pancreas is within normal limits. Spleen: The spleen is unremarkable in appearance. Adrenals/Urinary Tract: The adrenal glands are unremarkable in appearance. Minimal left-sided hydronephrosis is noted, with a left ureteral stent noted in expected position. Trace postoperative air is noted at the left renal pelvis. Nonspecific perinephric stranding is noted bilaterally. A parenchymal calcification is noted at the left kidney, and a nonobstructing 4 mm stone is noted at the interpole region of the left kidney. No obstructing ureteral stone is seen. Stomach/Bowel: The stomach is unremarkable in appearance. The small bowel is within normal limits. The appendix is normal in caliber, without evidence of appendicitis. The colon is unremarkable in appearance. Vascular/Lymphatic: Diffuse calcification is seen along the abdominal aorta and its branches. The  abdominal aorta is otherwise grossly unremarkable. The inferior vena cava is grossly unremarkable. No retroperitoneal lymphadenopathy is seen. No pelvic sidewall lymphadenopathy is identified. Reproductive: The bladder is moderately distended. A small amount of air within the bladder reflects the recent procedure. The prostate is enlarged, measuring 6.2 cm in transverse dimension, with minimal calcification. Other: A large amount of soft tissue air is noted along the abdominal wall, and also surrounding the left kidney, tracking along Gerota's fascia. Fluid tracks over the left iliopsoas, with associated air, extending into the pelvis. Air tracks along the inguinal regions bilaterally, and anterior to the penile shaft. On correlation with the operative note, this appears to reflect the placement of multiple ports during surgery, in each of these regions. Musculoskeletal: No acute osseous abnormalities are identified. Disc space narrowing is noted at L2-L3, with associated endplate sclerotic change. The patient is status post interbody fusion at L4-L5 and L5-S1. There is mild grade 1 anterolisthesis of L4 on L5. The visualized musculature is unremarkable in appearance. IMPRESSION: 1. Minimal left-sided hydronephrosis, with left ureteral stent noted in expected position. 2. Fluid tracks over the left iliopsoas, with associated air, extending into the pelvis. This may reflect sequelae of hydronephrosis prior to the time of surgery. 3. Large amount of soft tissue air along the abdominal wall, surrounding the left kidney, extending along the inguinal regions bilaterally, and anterior to the penile shaft. This appears to reflect the placement of multiple ports during surgery, in each of these regions, on correlation with the operative note. 4. Bibasilar atelectasis noted. 5. Diffuse coronary artery calcifications seen. 6. Diffuse aortic atherosclerosis noted. 7. Enlarged prostate seen. 8. Mild degenerative change along  the lumbar spine, with interbody fusion at L4-S1. Electronically Signed   By: Garald Balding M.D.   On: 09/17/2016 03:47    Assessment & Plan:   There are no diagnoses linked to this encounter. I have discontinued Mr. Friis cephALEXin. I am also having him maintain his Abatacept (ORENCIA [place]), Propylene Glycol (SYSTANE BALANCE OP), DULoxetine, ezetimibe, albuterol, rosuvastatin, metoprolol tartrate, lisinopril, predniSONE, zolpidem, LORazepam, fexofenadine, senna-docusate, omeprazole, oxyCODONE-acetaminophen, senna-docusate, ELIQUIS, NITROSTAT, and ELIQUIS.  No orders of the defined types were placed in this encounter.    Follow-up: No Follow-up on file.  [name], MD    For Kim's note below: Medical screening examination/treatment/procedure(s) were performed by non-physician practitioner and as supervising physician I was immediately available for consultation/collaboration. I agree with above. [name], MD

## 2016-11-18 NOTE — Assessment & Plan Note (Signed)
Percocet prn 

## 2016-11-18 NOTE — Patient Instructions (Addendum)
Continue to eat heart healthy diet (full of fruits, vegetables, whole grains, lean protein, water--limit salt, fat, and sugar intake) and increase physical activity as tolerated.  Continue doing brain stimulating activities (puzzles, reading, adult coloring books, staying active) to keep memory sharp.   Bring a copy of your advance directives to your next office visit.   Fall Prevention in the Home Introduction Falls can cause injuries. They can happen to people of all ages. There are many things you can do to make your home safe and to help prevent falls. What can I do on the outside of my home?  Regularly fix the edges of walkways and driveways and fix any cracks.  Remove anything that might make you trip as you walk through a door, such as a raised step or threshold.  Trim any bushes or trees on the path to your home.  Use bright outdoor lighting.  Clear any walking paths of anything that might make someone trip, such as rocks or tools.  Regularly check to see if handrails are loose or broken. Make sure that both sides of any steps have handrails.  Any raised decks and porches should have guardrails on the edges.  Have any leaves, snow, or ice cleared regularly.  Use sand or salt on walking paths during winter.  Clean up any spills in your garage right away. This includes oil or grease spills. What can I do in the bathroom?  Use night lights.  Install grab bars by the toilet and in the tub and shower. Do not use towel bars as grab bars.  Use non-skid mats or decals in the tub or shower.  If you need to sit down in the shower, use a plastic, non-slip stool.  Keep the floor dry. Clean up any water that spills on the floor as soon as it happens.  Remove soap buildup in the tub or shower regularly.  Attach bath mats securely with double-sided non-slip rug tape.  Do not have throw rugs and other things on the floor that can make you trip. What can I do in the  bedroom?  Use night lights.  Make sure that you have a light by your bed that is easy to reach.  Do not use any sheets or blankets that are too big for your bed. They should not hang down onto the floor.  Have a firm chair that has side arms. You can use this for support while you get dressed.  Do not have throw rugs and other things on the floor that can make you trip. What can I do in the kitchen?  Clean up any spills right away.  Avoid walking on wet floors.  Keep items that you use a lot in easy-to-reach places.  If you need to reach something above you, use a strong step stool that has a grab bar.  Keep electrical cords out of the way.  Do not use floor polish or wax that makes floors slippery. If you must use wax, use non-skid floor wax.  Do not have throw rugs and other things on the floor that can make you trip. What can I do with my stairs?  Do not leave any items on the stairs.  Make sure that there are handrails on both sides of the stairs and use them. Fix handrails that are broken or loose. Make sure that handrails are as long as the stairways.  Check any carpeting to make sure that it is firmly attached to the   stairs. Fix any carpet that is loose or worn.  Avoid having throw rugs at the top or bottom of the stairs. If you do have throw rugs, attach them to the floor with carpet tape.  Make sure that you have a light switch at the top of the stairs and the bottom of the stairs. If you do not have them, ask someone to add them for you. What else can I do to help prevent falls?  Wear shoes that:  Do not have high heels.  Have rubber bottoms.  Are comfortable and fit you well.  Are closed at the toe. Do not wear sandals.  If you use a stepladder:  Make sure that it is fully opened. Do not climb a closed stepladder.  Make sure that both sides of the stepladder are locked into place.  Ask someone to hold it for you, if possible.  Clearly mark and make  sure that you can see:  Any grab bars or handrails.  First and last steps.  Where the edge of each step is.  Use tools that help you move around (mobility aids) if they are needed. These include:  Canes.  Walkers.  Scooters.  Crutches.  Turn on the lights when you go into a dark area. Replace any light bulbs as soon as they burn out.  Set up your furniture so you have a clear path. Avoid moving your furniture around.  If any of your floors are uneven, fix them.  If there are any pets around you, be aware of where they are.  Review your medicines with your doctor. Some medicines can make you feel dizzy. This can increase your chance of falling. Ask your doctor what other things that you can do to help prevent falls. This information is not intended to replace advice given to you by your health care provider. Make sure you discuss any questions you have with your health care provider. Document Released: 08/28/2009 Document Revised: 04/08/2016 Document Reviewed: 12/06/2014  2017 [employer]vier  Health Maintenance, Male A healthy lifestyle and preventative care can promote health and wellness.  Maintain regular health, dental, and eye exams.  Eat a healthy diet. Foods like vegetables, fruits, whole grains, low-fat dairy products, and lean protein foods contain the nutrients you need and are low in calories. Decrease your intake of foods high in solid fats, added sugars, and salt. Get information about a proper diet from your health care provider, if necessary.  Regular physical exercise is one of the most important things you can do for your health. Most adults should get at least 150 minutes of moderate-intensity exercise (any activity that increases your heart rate and causes you to sweat) each week. In addition, most adults need muscle-strengthening exercises on 2 or more days a week.   Maintain a healthy weight. The body mass index (BMI) is a screening tool to identify possible  weight problems. It provides an estimate of body fat based on height and weight. Your health care provider can find your BMI and can help you achieve or maintain a healthy weight. For males 20 years and older:  A BMI below 18.5 is considered underweight.  A BMI of 18.5 to 24.9 is normal.  A BMI of 25 to 29.9 is considered overweight.  A BMI of 30 and above is considered obese.  Maintain normal blood lipids and cholesterol by exercising and minimizing your intake of saturated fat. Eat a balanced diet with plenty of fruits and vegetables. Blood tests   for lipids and cholesterol should begin at age 20 and be repeated every 5 years. If your lipid or cholesterol levels are high, you are over age 50, or you are at high risk for heart disease, you may need your cholesterol levels checked more frequently.Ongoing high lipid and cholesterol levels should be treated with medicines if diet and exercise are not working.  If you smoke, find out from your health care provider how to quit. If you do not use tobacco, do not start.  Lung cancer screening is recommended for adults aged 55-80 years who are at high risk for developing lung cancer because of a history of smoking. A yearly low-dose CT scan of the lungs is recommended for people who have at least a 30-pack-year history of smoking and are current smokers or have quit within the past 15 years. A pack year of smoking is smoking an average of 1 pack of cigarettes a day for 1 year (for example, a 30-pack-year history of smoking could mean smoking 1 pack a day for 30 years or 2 packs a day for 15 years). Yearly screening should continue until the smoker has stopped smoking for at least 15 years. Yearly screening should be stopped for people who develop a health problem that would prevent them from having lung cancer treatment.  If you choose to drink alcohol, do not have more than 2 drinks per day. One drink is considered to be 12 oz (360 mL) of beer, 5 oz (150  mL) of wine, or 1.5 oz (45 mL) of liquor.  Avoid the use of street drugs. Do not share needles with anyone. Ask for help if you need support or instructions about stopping the use of drugs.  High blood pressure causes heart disease and increases the risk of stroke. High blood pressure is more likely to develop in:  People who have blood pressure in the end of the normal range (100-139/85-89 mm Hg).  People who are overweight or obese.  People who are African American.  If you are 18-39 years of age, have your blood pressure checked every 3-5 years. If you are 40 years of age or older, have your blood pressure checked every year. You should have your blood pressure measured twice-once when you are at a hospital or clinic, and once when you are not at a hospital or clinic. Record the average of the two measurements. To check your blood pressure when you are not at a hospital or clinic, you can use:  An automated blood pressure machine at a pharmacy.  A home blood pressure monitor.  If you are 45-79 years old, ask your health care provider if you should take aspirin to prevent heart disease.  Diabetes screening involves taking a blood sample to check your fasting blood sugar level. This should be done once every 3 years after age 45 if you are at a normal weight and without risk factors for diabetes. Testing should be considered at a younger age or be carried out more frequently if you are overweight and have at least 1 risk factor for diabetes.  Colorectal cancer can be detected and often prevented. Most routine colorectal cancer screening begins at the age of 50 and continues through age 75. However, your health care provider may recommend screening at an earlier age if you have risk factors for colon cancer. On a yearly basis, your health care provider may provide home test kits to check for hidden blood in the stool. A small camera   at the end of a tube may be used to directly examine the colon  (sigmoidoscopy or colonoscopy) to detect the earliest forms of colorectal cancer. Talk to your health care provider about this at age 50 when routine screening begins. A direct exam of the colon should be repeated every 5-10 years through age 75, unless early forms of precancerous polyps or small growths are found.  People who are at an increased risk for hepatitis B should be screened for this virus. You are considered at high risk for hepatitis B if:  You were born in a country where hepatitis B occurs often. Talk with your health care provider about which countries are considered high risk.  Your parents were born in a high-risk country and you have not received a shot to protect against hepatitis B (hepatitis B vaccine).  You have HIV or AIDS.  You use needles to inject street drugs.  You live with, or have sex with, someone who has hepatitis B.  You are a man who has sex with other men (MSM).  You get hemodialysis treatment.  You take certain medicines for conditions like cancer, organ transplantation, and autoimmune conditions.  Hepatitis C blood testing is recommended for all people born from 1945 through 1965 and any individual with known risk factors for hepatitis C.  Healthy men should no longer receive prostate-specific antigen (PSA) blood tests as part of routine cancer screening. Talk to your health care provider about prostate cancer screening.  Testicular cancer screening is not recommended for adolescents or adult males who have no symptoms. Screening includes self-exam, a health care provider exam, and other screening tests. Consult with your health care provider about any symptoms you have or any concerns you have about testicular cancer.  Practice safe sex. Use condoms and avoid high-risk sexual practices to reduce the spread of sexually transmitted infections (STIs).  You should be screened for STIs, including gonorrhea and chlamydia if:  You are sexually active and  are younger than 24 years.  You are older than 24 years, and your health care provider tells you that you are at risk for this type of infection.  Your sexual activity has changed since you were last screened, and you are at an increased risk for chlamydia or gonorrhea. Ask your health care provider if you are at risk.  If you are at risk of being infected with HIV, it is recommended that you take a prescription medicine daily to prevent HIV infection. This is called pre-exposure prophylaxis (PrEP). You are considered at risk if:  You are a man who has sex with other men (MSM).  You are a heterosexual man who is sexually active with multiple partners.  You take drugs by injection.  You are sexually active with a partner who has HIV.  Talk with your health care provider about whether you are at high risk of being infected with HIV. If you choose to begin PrEP, you should first be tested for HIV. You should then be tested every 3 months for as long as you are taking PrEP.  Use sunscreen. Apply sunscreen liberally and repeatedly throughout the day. You should seek shade when your shadow is shorter than you. Protect yourself by wearing long sleeves, pants, a wide-brimmed hat, and sunglasses year round whenever you are outdoors.  Tell your health care provider of new moles or changes in moles, especially if there is a change in shape or color. Also, tell your health care provider if a mole   is larger than the size of a pencil eraser.  A one-time screening for abdominal aortic aneurysm (AAA) and surgical repair of large AAAs by ultrasound is recommended for men aged 65-75 years who are current or former smokers.  Stay current with your vaccines (immunizations). This information is not intended to replace advice given to you by your health care provider. Make sure you discuss any questions you have with your health care provider. Document Released: 04/29/2008 Document Revised: 11/22/2014 Document  Reviewed: 08/05/2015 Elsevier Interactive Patient Education  2017 [employer]  

## 2016-11-18 NOTE — Assessment & Plan Note (Signed)
Eliquis 

## 2016-11-18 NOTE — Assessment & Plan Note (Signed)
Cymbalta 

## 2016-11-18 NOTE — Assessment & Plan Note (Signed)
Here for medicare wellness/physical  Diet: heart healthy  Physical activity: not sedentary  Depression/mood screen: negative on Rx Hearing: intact to whispered voice w/aids Visual acuity: grossly normal, performs annual eye exam  ADLs: capable  Fall risk: low  Home safety: good  Cognitive evaluation: intact to orientation, naming, recall and repetition  EOL planning: adv directives, full code/ I agree  I have personally reviewed and have noted  1. The patient's medical, surgical and social history  2. Their use of alcohol, tobacco or illicit drugs  3. Their current medications and supplements  4. The patient's functional ability including ADL's, fall risks, home safety risks and hearing or visual impairment.  5. Diet and physical activities  6. Evidence for depression or mood disorders 7. The roster of all physicians providing medical care to patient - is listed in the Snapshot section of the chart and reviewed today.    Today patient counseled on age appropriate routine health concerns for screening and prevention, each reviewed and up to date or declined. Immunizations reviewed and up to date or declined. Labs ordered and reviewed. Risk factors for depression reviewed and negative. Hearing function and visual acuity are intact. ADLs screened and addressed as needed. Functional ability and level of safety reviewed and appropriate. Education, counseling and referrals performed based on assessed risks today. Patient provided with a copy of personalized plan for preventive services.

## 2016-11-18 NOTE — Assessment & Plan Note (Signed)
Back on Crestor

## 2016-11-24 DIAGNOSIS — M0609 Rheumatoid arthritis without rheumatoid factor, multiple sites: Secondary | ICD-10-CM | POA: Diagnosis not present

## 2016-11-24 DIAGNOSIS — Z79899 Other long term (current) drug therapy: Secondary | ICD-10-CM | POA: Diagnosis not present

## 2016-12-02 ENCOUNTER — Ambulatory Visit: Payer: Medicare Other | Admitting: Internal Medicine

## 2016-12-03 ENCOUNTER — Other Ambulatory Visit: Payer: Self-pay | Admitting: *Deleted

## 2016-12-03 MED ORDER — APIXABAN 5 MG PO TABS: 5.0000 mg | ORAL_TABLET | Freq: Two times a day (BID) | ORAL | 3 refills | Status: DC

## 2016-12-06 ENCOUNTER — Ambulatory Visit (INDEPENDENT_AMBULATORY_CARE_PROVIDER_SITE_OTHER): Payer: Medicare Other | Admitting: Internal Medicine

## 2016-12-06 ENCOUNTER — Other Ambulatory Visit: Payer: Self-pay | Admitting: Internal Medicine

## 2016-12-06 ENCOUNTER — Encounter: Payer: Self-pay | Admitting: Internal Medicine

## 2016-12-06 VITALS — BP 124/80 | HR 60 | Ht 72.0 in | Wt 182.0 lb

## 2016-12-06 DIAGNOSIS — L821 Other seborrheic keratosis: Secondary | ICD-10-CM | POA: Diagnosis not present

## 2016-12-06 DIAGNOSIS — I480 Paroxysmal atrial fibrillation: Secondary | ICD-10-CM | POA: Diagnosis not present

## 2016-12-06 DIAGNOSIS — L570 Actinic keratosis: Secondary | ICD-10-CM | POA: Diagnosis not present

## 2016-12-06 DIAGNOSIS — I25119 Atherosclerotic heart disease of native coronary artery with unspecified angina pectoris: Secondary | ICD-10-CM | POA: Diagnosis not present

## 2016-12-06 DIAGNOSIS — G4733 Obstructive sleep apnea (adult) (pediatric): Secondary | ICD-10-CM | POA: Diagnosis not present

## 2016-12-06 DIAGNOSIS — L720 Epidermal cyst: Secondary | ICD-10-CM | POA: Diagnosis not present

## 2016-12-06 DIAGNOSIS — I10 Essential (primary) hypertension: Secondary | ICD-10-CM

## 2016-12-06 DIAGNOSIS — D1801 Hemangioma of skin and subcutaneous tissue: Secondary | ICD-10-CM | POA: Diagnosis not present

## 2016-12-06 DIAGNOSIS — I209 Angina pectoris, unspecified: Secondary | ICD-10-CM

## 2016-12-06 DIAGNOSIS — Z85828 Personal history of other malignant neoplasm of skin: Secondary | ICD-10-CM | POA: Diagnosis not present

## 2016-12-06 DIAGNOSIS — L853 Xerosis cutis: Secondary | ICD-10-CM | POA: Diagnosis not present

## 2016-12-06 NOTE — Progress Notes (Signed)
Electrophysiology Office Note   Date:  12/06/2016   ID:  Evan, Todd 1933-10-16, MRN [id number]  PCP:  Evan Kehr, MD  Primary Electrophysiologist: Evan Grayer, MD    No chief complaint on file.    History of Present Illness: [name] is a 83 y.o. male who presents today for electrophysiology evaluation.   Doing very well.  Unaware of any afib int he past year.  Compliant with CPAP.  Keeping ETOH in moderation.   Rheumatoid arthritis is stable.  [employer] fan. Today, he denies symptoms of palpitations, chest pain, shortness of breath, orthopnea, PND, lower extremity edema, claudication, dizziness, presyncope, syncope, bleeding, or neurologic sequela. The patient is tolerating medications without difficulties and is otherwise without complaint today.    Past Medical History:  Diagnosis Date  . CAD (coronary artery disease) 12/2013--  cardiologist-  Evan [name]/ [name]   s/p inferior STEMI 01/08/2014; [place] 01/08/14: total RCA occlusion s/p 3.5x65mm Xience DES distal RCA and 3.5x28 mm DES mid RCA, 60-70% mid LAD stenosis, EF 55%  . Complication of anesthesia    'long to wake up after back surgery " 09/2003  . Diverticulosis of colon   . GERD (gastroesophageal reflux disease)   . History of cellulitis    05-26-2015  LLE  . History of colon polyps    1998- benign/  2008 adenomatous   . History of kidney stones    2013  . History of squamous cell carcinoma excision    2013;  2015;  06-12-2015 right leg/  02/ and 05/ 2017  left ear and left leg  . History of ST elevation myocardial infarction (STEMI) 01/08/2014   acute infertor--  s/p  DES x2 to RCA  . Hydronephrosis, left   . Hyperlipidemia   . Hypertension   . Migraine with aura   . OSA on CPAP 06/19/2015   Moderate OSA with AHI 18/hr  per study 05-20-2015  . Osteoarthritis   . Palpitations   . Paroxysmal atrial fibrillation ([name]) 4/16   chads2vasc score is at least 4  . Premature atrial contractions     . RA (rheumatoid arthritis) [facility])    rheumatologist-  Evan Leigh [place]  . S/P drug eluting coronary stent placement 01/08/2014   x2  to mid and distal RCA  . Sinusitis, chronic 10/17/2015  . Wears glasses   . Wears hearing aid    bilateral   Past Surgical History:  Procedure Laterality Date  . BACK SURGERY    . CARDIOVASCULAR STRESS TEST  11/21/2015   Low risk nuclear study w/ a small diaphragmatic attenuation artifact, no ischemia/  normal LV function and wall motion , ef 63%  . CATARACT EXTRACTION W/ INTRAOCULAR LENS  IMPLANT, BILATERAL  2015  . COLONOSCOPY  last one 06-09-2011  . CORONARY ANGIOPLASTY    . CYSTOSCOPY W/ RETROGRADES Left 07/12/2016   Procedure: CYSTOSCOPY WITH RETROGRADE PYELOGRAM LEFT URETERAL STENT;  Surgeon: Irine Seal, MD;  Location: [facility];  Service: Urology;  Laterality: Left;  . CYSTOSCOPY WITH RETROGRADE PYELOGRAM, URETEROSCOPY AND STENT PLACEMENT Left 08/11/2016   Procedure: CYSTOSCOPY WITH RETROGRADE PYELOGRAM,  DIAGNOSTIC URETEROSCOPY , STENT EXCHANGE;  Surgeon: Alexis Frock, MD;  Location: [facility];  Service: Urology;  Laterality: Left;  . 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   severe fibromatosis palm and fingers  . LEFT HEART CATHETERIZATION WITH CORONARY ANGIOGRAM N/A 01/08/2014   Procedure: LEFT HEART CATHETERIZATION WITH CORONARY ANGIOGRAM;  Surgeon: Troy Sine,  MD;  Location: [place] CATH LAB;  Service: Cardiovascular;  Laterality:N/A;  total/ subtotal RCA/  mLAD [id number] w/ mid systolic bridging/  preserved global LVF, ef 55%  . MOHS SURGERY  x2  feb and may 2017   left ear /  left leg  ([facility])  . PERCUTANEOUS CORONARY STENT INTERVENTION (PCI-S)  01/08/2014   Procedure: PERCUTANEOUS CORONARY STENT INTERVENTION (PCI-S);  Surgeon: Troy Sine, MD;  Location: [facility] C[facility] LAB;  Service: Cardiovascular;;  DES to mid and distal RCA  . POSTERIOR LUMBAR FUSION  10/01/2003   and Laminectomy/ diskectomy  L4 -- S1  . ROBOT ASSISTED  PYELOPLASTY Left 09/15/2016   Procedure: XI ROBOTIC ASSISTED PYELOPLASTY;  Surgeon: Alexis Frock, MD;  Location: [facility];  Service: Urology;  Laterality: Left;  . TONSILLECTOMY    . TRANSTHORACIC ECHOCARDIOGRAM  02/23/2016   mild LVH,  ef 50-55%,  grade 1 diastolic dysfunction/  mild to moderate AV calcification w/ no stenosis or regurg./  trivial MR and TR/  mild PR     Current Outpatient Prescriptions  Medication Sig Dispense Refill  . Abatacept (ORENCIA [place]) Inject 1 Applicatorful into the skin See admin instructions. Every 4 weeks Next injection due 09-29-16    . apixaban (ELIQUIS) 5 MG TABS tablet Take 1 tablet (5 mg total) by mouth 2 (two) times daily. 180 tablet 3  . DULoxetine (CYMBALTA) 60 MG capsule Take 60 mg by mouth every morning.     . ezetimibe (ZETIA) 10 MG tablet Take 5 mg by mouth at bedtime.     . fexofenadine (ALLEGRA) 180 MG tablet Take 180 mg by mouth daily as needed for allergies or rhinitis.     [name] lisinopril (PRINIVIL,ZESTRIL) 5 MG tablet TAKE ONE TABLET EACH DAY 90 tablet 1  . LORazepam (ATIVAN) 0.5 MG tablet Take 1-2 tablets (0.5-1 mg total) by mouth 2 (two) times daily. Take 1-2 tablets by mouth daily as needed for anxiety. (Patient taking differently: Take 0.5 mg by mouth at bedtime. ) 60 tablet 3  . metoprolol tartrate (LOPRESSOR) 25 MG tablet TAKE ONE AND ONE-HALF TABLET TWICE DAILY (Patient taking differently: TAKE ONE AND ONE-HALF TABLET TWICE DAILY- (total 37.5mg )) 270 tablet 3  . NITROSTAT 0.4 MG SL tablet ONE TABLET UNDER TONGUE EVERY 5 MINUTES FOR 3 DOSES AS NEEDED FOR CHEST PAIN 25 tablet 4  . omeprazole (PRILOSEC) 40 MG capsule TAKE ONE CAPSULE EACH DAY 90 capsule 1  . oxyCODONE-acetaminophen (PERCOCET/ROXICET) 5-325 MG tablet Take 1-2 tablets by mouth every 6 (six) hours as needed for moderate pain or severe pain. Post-operatively 30 tablet 0  . predniSONE (DELTASONE) 1 MG tablet Take 3 mg by mouth every morning.   0  . Propylene Glycol (SYSTANE BALANCE  OP) Place 1 drop into both eyes daily as needed (dry eyeS).     [name] rosuvastatin (CRESTOR) 10 MG tablet TAKE ONE TABLET EACH DAY (Patient taking differently: TAKE ONE TABLET EACH DAY AT NIGHT) 30 tablet 5  . zolpidem (AMBIEN) 10 MG tablet Take 1 tablet (10 mg total) by mouth at bedtime as needed. for sleep 90 tablet 1   No current facility-administered medications for this visit.     Allergies:   Methotrexate; Lisinopril; Atorvastatin; and Pravastatin sodium   Social History:  The patient  reports that he quit smoking about 48 years ago. His smoking use included Cigarettes. He quit after 10.00 years of use. He has never used smokeless tobacco. He reports that he drinks about 4.2 - 8.4 oz  of alcohol per week . He reports that he does not use drugs.   Family History:  The patient's  family history includes Heart disease in his father; Hypertension in his mother.    ROS:  Please see the history of present illness.   All other systems are reviewed and negative.    PHYSICAL EXAM: VS:  BP 124/80 (BP Location: Left Arm, Patient Position: Sitting, Cuff Size: Normal)   Pulse 60   Ht 6' (1.829 m)   Wt 182 lb (82.6 kg)   BMI 24.68 kg/m  , BMI Body mass index is 24.68 kg/m. GEN: Well nourished, well developed, in no acute distress  HEENT: normal  Neck: no JVD, carotid bruits, or masses Cardiac: RRR; no murmurs, rubs, or gallops,no edema  Respiratory:  clear to auscultation bilaterally, normal work of breathing GI: soft, nontender, nondistended, + BS MS: no deformity or atrophy  Skin: warm and dry  Neuro:  Strength and sensation are intact Psych: euthymic mood, full affect  EKG:  EKG is ordered today. The ekg ordered today shows sinus bradycardia 57 bpm, otherwise normal ekg   Recent Labs: 09/17/2016: ALT 15; BUN 22; Creatinine, Ser 1.39; Hemoglobin 13.7; Platelets 243; Potassium 3.9; Sodium 137    Lipid Panel     Component Value Date/Time   CHOL 145 02/23/2016 0909   TRIG 109  02/23/2016 0909   HDL 56 02/23/2016 0909   CHOLHDL 2.6 02/23/2016 0909   VLDL 22 02/23/2016 0909   LDLCALC 67 02/23/2016 0909   LDLDIRECT 168.9 05/24/2013 0732     Wt Readings from Last 3 Encounters:  12/06/16 182 lb (82.6 kg)  11/18/16 176 lb 3.2 oz (79.9 kg)  09/17/16 182 lb (82.6 kg)    ASSESSMENT AND PLAN:   1. AF Stable, without recurrent Continue eliquis chads2vasc score is at least 4 Would not initiate AAD therapy unless he has additional afib No changes today  2. OSA Stable No change required today  3. Cad Doing well  No ischemic symptoms.  5. htn Stable No change required today  6. HL Stable PCP to follow lipids  To see Evan Radford Pax in 6 months I will therefore see in a year   Current medicines are reviewed at length with the patient today.   The patient does not have concerns regarding his medicines.  The following changes were made today:  none   Signed, Evan Grayer, MD  12/06/2016 10:12 AM     [facility] [address] [place] [address] [phone] (office) [phone] (fax)

## 2016-12-06 NOTE — Patient Instructions (Addendum)

## 2016-12-07 ENCOUNTER — Other Ambulatory Visit: Payer: Self-pay | Admitting: Internal Medicine

## 2016-12-07 NOTE — Telephone Encounter (Signed)
Rx refill sent to pharmacy. 

## 2016-12-08 ENCOUNTER — Other Ambulatory Visit: Payer: Self-pay | Admitting: *Deleted

## 2016-12-08 MED ORDER — DULOXETINE HCL 60 MG PO CPEP: 60.0000 mg | ORAL_CAPSULE | Freq: Every morning | ORAL | 3 refills | Status: DC

## 2016-12-09 NOTE — Telephone Encounter (Signed)
Per last office visit 12/06/16 with Dr Rayann Heman  6. HL Stable PCP to follow lipids  Okay to refill or should this be deferred to patients pcp? Please advise. Thanks, MI

## 2016-12-10 NOTE — Telephone Encounter (Signed)
° °*  STAT* If patient is at the pharmacy, call can be transferred to refill team.   1. Which medications need to be refilled? (please list name of each medication and dose if known):  Ezetimibe (ZETIA) 10 MG tablet  2. Which pharmacy/location (including street and city if local pharmacy) is medication to be sent to?  [facility] Fax: [phone]  3. Do they need a 30 day or 90 day supply?  90 days

## 2016-12-14 ENCOUNTER — Encounter: Payer: Self-pay | Admitting: Internal Medicine

## 2016-12-15 ENCOUNTER — Other Ambulatory Visit: Payer: Self-pay | Admitting: Internal Medicine

## 2016-12-15 NOTE — Telephone Encounter (Signed)
PCP to fill

## 2016-12-20 DIAGNOSIS — M0609 Rheumatoid arthritis without rheumatoid factor, multiple sites: Secondary | ICD-10-CM | POA: Diagnosis not present

## 2016-12-20 DIAGNOSIS — Z79899 Other long term (current) drug therapy: Secondary | ICD-10-CM | POA: Diagnosis not present

## 2016-12-20 DIAGNOSIS — Z111 Encounter for screening for respiratory tuberculosis: Secondary | ICD-10-CM | POA: Diagnosis not present

## 2016-12-21 DIAGNOSIS — M0609 Rheumatoid arthritis without rheumatoid factor, multiple sites: Secondary | ICD-10-CM | POA: Diagnosis not present

## 2016-12-21 DIAGNOSIS — Z79899 Other long term (current) drug therapy: Secondary | ICD-10-CM | POA: Diagnosis not present

## 2016-12-27 DIAGNOSIS — M0609 Rheumatoid arthritis without rheumatoid factor, multiple sites: Secondary | ICD-10-CM | POA: Diagnosis not present

## 2017-01-24 DIAGNOSIS — M0609 Rheumatoid arthritis without rheumatoid factor, multiple sites: Secondary | ICD-10-CM | POA: Diagnosis not present

## 2017-02-21 ENCOUNTER — Other Ambulatory Visit: Payer: Self-pay | Admitting: Internal Medicine

## 2017-02-21 ENCOUNTER — Ambulatory Visit (INDEPENDENT_AMBULATORY_CARE_PROVIDER_SITE_OTHER): Payer: Medicare Other | Admitting: Internal Medicine

## 2017-02-21 ENCOUNTER — Encounter: Payer: Self-pay | Admitting: Internal Medicine

## 2017-02-21 ENCOUNTER — Other Ambulatory Visit (INDEPENDENT_AMBULATORY_CARE_PROVIDER_SITE_OTHER): Payer: Medicare Other

## 2017-02-21 VITALS — BP 124/78 | HR 70 | Temp 97.7°F | Ht 72.0 in | Wt 184.0 lb

## 2017-02-21 DIAGNOSIS — I10 Essential (primary) hypertension: Secondary | ICD-10-CM | POA: Diagnosis not present

## 2017-02-21 DIAGNOSIS — I2510 Atherosclerotic heart disease of native coronary artery without angina pectoris: Secondary | ICD-10-CM

## 2017-02-21 DIAGNOSIS — N320 Bladder-neck obstruction: Secondary | ICD-10-CM

## 2017-02-21 DIAGNOSIS — M5440 Lumbago with sciatica, unspecified side: Secondary | ICD-10-CM

## 2017-02-21 DIAGNOSIS — R739 Hyperglycemia, unspecified: Secondary | ICD-10-CM

## 2017-02-21 DIAGNOSIS — M069 Rheumatoid arthritis, unspecified: Secondary | ICD-10-CM | POA: Diagnosis not present

## 2017-02-21 DIAGNOSIS — I25119 Atherosclerotic heart disease of native coronary artery with unspecified angina pectoris: Secondary | ICD-10-CM

## 2017-02-21 DIAGNOSIS — M0609 Rheumatoid arthritis without rheumatoid factor, multiple sites: Secondary | ICD-10-CM | POA: Diagnosis not present

## 2017-02-21 DIAGNOSIS — F4321 Adjustment disorder with depressed mood: Secondary | ICD-10-CM | POA: Diagnosis not present

## 2017-02-21 LAB — LIPID PANEL
Cholesterol: 164 mg/dL (ref 0–200)
HDL: 52.2 mg/dL (ref >39.00)
LDL Cholesterol: 78 mg/dL (ref 0–99)
NonHDL: 112.27
Total CHOL/HDL Ratio: 3
Triglycerides: 173 mg/dL — ABNORMAL HIGH (ref 0.0–149.0)
VLDL: 34.6 mg/dL (ref 0.0–40.0)

## 2017-02-21 LAB — URINALYSIS
Bilirubin Urine: NEGATIVE
Hgb urine dipstick: NEGATIVE
Ketones, ur: NEGATIVE
Leukocytes, UA: NEGATIVE
Nitrite: NEGATIVE
Specific Gravity, Urine: 1.01 (ref 1.000–1.030)
Total Protein, Urine: NEGATIVE
Urine Glucose: NEGATIVE
Urobilinogen, UA: 0.2 (ref 0.0–1.0)
pH: 5.5 (ref 5.0–8.0)

## 2017-02-21 LAB — CBC WITH DIFFERENTIAL/PLATELET
Basophils Absolute: 0 10*3/uL (ref 0.0–0.1)
Basophils Relative: 0.6 % (ref 0.0–3.0)
Eosinophils Absolute: 0.1 10*3/uL (ref 0.0–0.7)
Eosinophils Relative: 0.9 % (ref 0.0–5.0)
HCT: 42.3 % (ref 39.0–52.0)
Hemoglobin: 14.2 g/dL (ref 13.0–17.0)
Lymphocytes Relative: 22.9 % (ref 12.0–46.0)
Lymphs Abs: 1.4 10*3/uL (ref 0.7–4.0)
MCHC: 33.5 g/dL (ref 30.0–36.0)
MCV: 99.1 fl (ref 78.0–100.0)
Monocytes Absolute: 0.4 10*3/uL (ref 0.1–1.0)
Monocytes Relative: 6.9 % (ref 3.0–12.0)
Neutro Abs: 4.1 10*3/uL (ref 1.4–7.7)
Neutrophils Relative %: 68.7 % (ref 43.0–77.0)
Platelets: 287 10*3/uL (ref 150.0–400.0)
RBC: 4.26 Mil/uL (ref 4.22–5.81)
RDW: 13.9 % (ref 11.5–15.5)
WBC: 6 10*3/uL (ref 4.0–10.5)

## 2017-02-21 LAB — HEMOGLOBIN A1C: Hgb A1c MFr Bld: 6.6 % — ABNORMAL HIGH (ref 4.6–6.5)

## 2017-02-21 LAB — HEPATIC FUNCTION PANEL
ALT: 23 U/L (ref 0–53)
AST: 20 U/L (ref 0–37)
Albumin: 4.4 g/dL (ref 3.5–5.2)
Alkaline Phosphatase: 61 U/L (ref 39–117)
Bilirubin, Direct: 0.1 mg/dL (ref 0.0–0.3)
Total Bilirubin: 0.5 mg/dL (ref 0.2–1.2)
Total Protein: 7.5 g/dL (ref 6.0–8.3)

## 2017-02-21 LAB — BASIC METABOLIC PANEL
BUN: 15 mg/dL (ref 6–23)
CO2: 29 mEq/L (ref 19–32)
Calcium: 9.5 mg/dL (ref 8.4–10.5)
Chloride: 107 mEq/L (ref 96–112)
Creatinine, Ser: 0.85 mg/dL (ref 0.40–1.50)
GFR: 91.83 mL/min (ref >60.00)
Glucose, Bld: 134 mg/dL — ABNORMAL HIGH (ref 70–99)
Potassium: 4.6 mEq/L (ref 3.5–5.1)
Sodium: 142 mEq/L (ref 135–145)

## 2017-02-21 LAB — PSA: PSA: 2.46 ng/mL (ref 0.10–4.00)

## 2017-02-21 MED ORDER — OXYCODONE-ACETAMINOPHEN 5-325 MG PO TABS: 1.0000 | ORAL_TABLET | Freq: Three times a day (TID) | ORAL | 0 refills | Status: DC | PRN

## 2017-02-21 NOTE — Assessment & Plan Note (Signed)
Percocet prn  Potential benefits of a long term opioids use as well as potential risks (i.e. addiction risk, apnea etc) and complications (i.e. Somnolence, constipation and others) were explained to the patient and were aknowledged.  

## 2017-02-21 NOTE — Progress Notes (Signed)
Pre visit review using our clinic review tool, if applicable. No additional management support is needed unless otherwise documented below in the visit note. 

## 2017-02-21 NOTE — Assessment & Plan Note (Signed)
Lisinopril, Toprol 

## 2017-02-21 NOTE — Assessment & Plan Note (Signed)
ASA, Zetia, Crestor, Toprol

## 2017-02-21 NOTE — Assessment & Plan Note (Signed)
Prednisone 3 mg [employer]

## 2017-02-21 NOTE — Assessment & Plan Note (Signed)
On cymbalta

## 2017-02-21 NOTE — Progress Notes (Signed)
Subjective:  Patient ID: [name], male    DOB: 1932/12/20  Age: 83 y.o. MRN: [id number]  CC: No chief complaint on file.   HPI Erich Kochan Cutrona presents for RA, LBP, HTN, CAD f/u  Outpatient Medications Prior to Visit  Medication Sig Dispense Refill  . Abatacept (ORENCIA [place]) Inject 1 Applicatorful into the skin See admin instructions. Every 4 weeks Next injection due 09-29-16    . apixaban (ELIQUIS) 5 MG TABS tablet Take 1 tablet (5 mg total) by mouth 2 (two) times daily. 180 tablet 3  . DULoxetine (CYMBALTA) 60 MG capsule Take 1 capsule (60 mg total) by mouth every morning. 90 capsule 3  . ezetimibe (ZETIA) 10 MG tablet TAKE ONE-HALF TABLET DAILY 45 tablet 3  . fexofenadine (ALLEGRA) 180 MG tablet Take 180 mg by mouth daily as needed for allergies or rhinitis.     [name] lisinopril (PRINIVIL,ZESTRIL) 5 MG tablet Take 1 tablet (5 mg total) by mouth daily. 90 tablet 3  . LORazepam (ATIVAN) 0.5 MG tablet Take 0.5 mg by mouth at bedtime.    . metoprolol tartrate (LOPRESSOR) 25 MG tablet TAKE ONE AND ONE-HALF TABLET TWICE DAILY (Patient taking differently: TAKE ONE AND ONE-HALF TABLET TWICE DAILY- (total 37.5mg )) 270 tablet 3  . NITROSTAT 0.4 MG SL tablet ONE TABLET UNDER TONGUE EVERY 5 MINUTES FOR 3 DOSES AS NEEDED FOR CHEST PAIN 25 tablet 4  . omeprazole (PRILOSEC) 40 MG capsule TAKE ONE CAPSULE EACH DAY 90 capsule 1  . oxyCODONE-acetaminophen (PERCOCET/ROXICET) 5-325 MG tablet Take 1-2 tablets by mouth every 6 (six) hours as needed for moderate pain or severe pain. Post-operatively 30 tablet 0  . predniSONE (DELTASONE) 1 MG tablet Take 3 mg by mouth every morning.   0  . Propylene Glycol (SYSTANE BALANCE OP) Place 1 drop into both eyes daily as needed (dry eyeS).     [name] rosuvastatin (CRESTOR) 10 MG tablet TAKE ONE TABLET EACH DAY 90 tablet 3  . zolpidem (AMBIEN) 10 MG tablet Take 1 tablet (10 mg total) by mouth at bedtime as needed. for sleep 90 tablet 1  . metoprolol tartrate (LOPRESSOR) 25  MG tablet Take 12.5 mg by mouth 2 (two) times daily.     No facility-administered medications prior to visit.     ROS Review of Systems  Constitutional: Positive for fatigue. Negative for appetite change and unexpected weight change.  HENT: Negative for congestion, nosebleeds, sneezing, sore throat and trouble swallowing.   Eyes: Negative for itching and visual disturbance.  Respiratory: Negative for cough.   Cardiovascular: Negative for chest pain, palpitations and leg swelling.  Gastrointestinal: Negative for abdominal distention, blood in stool, diarrhea and nausea.  Genitourinary: Negative for frequency and hematuria.  Musculoskeletal: Positive for arthralgias. Negative for back pain, gait problem, joint swelling and neck pain.  Skin: Negative for color change and rash.  Neurological: Negative for dizziness, tremors, speech difficulty and weakness.  Psychiatric/Behavioral: Negative for agitation, dysphoric mood, sleep disturbance and suicidal ideas. The patient is not nervous/anxious.     Objective:  BP 124/78 (BP Location: Right Arm, Patient Position: Sitting, Cuff Size: Normal)   Pulse 70   Temp 97.7 F (36.5 C) (Oral)   Ht 6' (1.829 m)   Wt 184 lb 0.6 oz (83.5 kg)   SpO2 97%   BMI 24.96 kg/m   BP Readings from Last 3 Encounters:  02/21/17 124/78  12/06/16 124/80  11/18/16 128/80    Wt Readings from Last 3 Encounters:  02/21/17 184 lb 0.6 oz (83.5 kg)  12/06/16 182 lb (82.6 kg)  11/18/16 176 lb 3.2 oz (79.9 kg)    Physical Exam  Constitutional: He is oriented to person, place, and time. He appears well-developed. No distress.  NAD  HENT:  Mouth/Throat: Oropharynx is clear and moist.  Eyes: Conjunctivae are normal. Pupils are equal, round, and reactive to light.  Neck: Normal range of motion. No JVD present. No thyromegaly present.  Cardiovascular: Normal rate, regular rhythm, normal heart sounds and intact distal pulses.  Exam reveals no gallop and no friction  rub.   No murmur heard. Pulmonary/Chest: Effort normal and breath sounds normal. No respiratory distress. He has no wheezes. He has no rales. He exhibits no tenderness.  Abdominal: Soft. Bowel sounds are normal. He exhibits no distension and no mass. There is no tenderness. There is no rebound and no guarding.  Musculoskeletal: Normal range of motion. He exhibits tenderness. He exhibits no edema.  Lymphadenopathy:    He has no cervical adenopathy.  Neurological: He is alert and oriented to person, place, and time. He has normal reflexes. No cranial nerve deficit. He exhibits normal muscle tone. He displays a negative Romberg sign. Coordination and gait normal.  Skin: Skin is warm and dry. No rash noted.  Psychiatric: He has a normal mood and affect. His behavior is normal. Judgment and thought content normal.    Lab Results  Component Value Date   WBC 11.8 (H) 09/17/2016   HGB 13.7 09/17/2016   HCT 39.9 09/17/2016   PLT 243 09/17/2016   GLUCOSE 148 (H) 09/17/2016   CHOL 145 02/23/2016   TRIG 109 02/23/2016   HDL 56 02/23/2016   LDLDIRECT 168.9 05/24/2013   LDLCALC 67 02/23/2016   ALT 15 (L) 09/17/2016   AST 20 09/17/2016   NA 137 09/17/2016   K 3.9 09/17/2016   CL 107 09/17/2016   CREATININE 1.39 (H) 09/17/2016   BUN 22 (H) 09/17/2016   CO2 23 09/17/2016   TSH 1.015 10/07/2015   PSA 1.52 06/04/2011   INR 0.92 09/13/2016   HGBA1C 6.2 (H) 01/08/2014    Ct Renal Stone Study  Result Date: 09/17/2016 CLINICAL DATA:  Acute onset of left lower quadrant abdominal pain and left flank pain. Status post recent pyeloplasty and left ureteral stent placement. Initial encounter. EXAM: CT ABDOMEN AND PELVIS WITHOUT CONTRAST TECHNIQUE: Multidetector CT imaging of the abdomen and pelvis was performed following the standard protocol without IV contrast. COMPARISON:  CT of the abdomen and pelvis from 09/10/2016 FINDINGS: Lower chest: Bibasilar atelectasis is noted. Diffuse coronary artery  calcifications are seen. Hepatobiliary: The liver is unremarkable in appearance. The gallbladder is unremarkable in appearance. The common bile duct remains normal in caliber. Pancreas: The pancreas is within normal limits. Spleen: The spleen is unremarkable in appearance. Adrenals/Urinary Tract: The adrenal glands are unremarkable in appearance. Minimal left-sided hydronephrosis is noted, with a left ureteral stent noted in expected position. Trace postoperative air is noted at the left renal pelvis. Nonspecific perinephric stranding is noted bilaterally. A parenchymal calcification is noted at the left kidney, and a nonobstructing 4 mm stone is noted at the interpole region of the left kidney. No obstructing ureteral stone is seen. Stomach/Bowel: The stomach is unremarkable in appearance. The small bowel is within normal limits. The appendix is normal in caliber, without evidence of appendicitis. The colon is unremarkable in appearance. Vascular/Lymphatic: Diffuse calcification is seen along the abdominal aorta and its branches. The abdominal aorta is  otherwise grossly unremarkable. The inferior vena cava is grossly unremarkable. No retroperitoneal lymphadenopathy is seen. No pelvic sidewall lymphadenopathy is identified. Reproductive: The bladder is moderately distended. A small amount of air within the bladder reflects the recent procedure. The prostate is enlarged, measuring 6.2 cm in transverse dimension, with minimal calcification. Other: A large amount of soft tissue air is noted along the abdominal wall, and also surrounding the left kidney, tracking along Gerota's fascia. Fluid tracks over the left iliopsoas, with associated air, extending into the pelvis. Air tracks along the inguinal regions bilaterally, and anterior to the penile shaft. On correlation with the operative note, this appears to reflect the placement of multiple ports during surgery, in each of these regions. Musculoskeletal: No acute  osseous abnormalities are identified. Disc space narrowing is noted at L2-L3, with associated endplate sclerotic change. The patient is status post interbody fusion at L4-L5 and L5-S1. There is mild grade 1 anterolisthesis of L4 on L5. The visualized musculature is unremarkable in appearance. IMPRESSION: 1. Minimal left-sided hydronephrosis, with left ureteral stent noted in expected position. 2. Fluid tracks over the left iliopsoas, with associated air, extending into the pelvis. This may reflect sequelae of hydronephrosis prior to the time of surgery. 3. Large amount of soft tissue air along the abdominal wall, surrounding the left kidney, extending along the inguinal regions bilaterally, and anterior to the penile shaft. This appears to reflect the placement of multiple ports during surgery, in each of these regions, on correlation with the operative note. 4. Bibasilar atelectasis noted. 5. Diffuse coronary artery calcifications seen. 6. Diffuse aortic atherosclerosis noted. 7. Enlarged prostate seen. 8. Mild degenerative change along the lumbar spine, with interbody fusion at L4-S1. Electronically Signed   By: Garald Balding M.D.   On: 09/17/2016 03:47    Assessment & Plan:   There are no diagnoses linked to this encounter. I am having Mr. Graser maintain his Abatacept (ORENCIA [place]), Propylene Glycol (SYSTANE BALANCE OP), metoprolol tartrate, predniSONE, fexofenadine, omeprazole, NITROSTAT, oxyCODONE-acetaminophen, zolpidem, apixaban, LORazepam, lisinopril, rosuvastatin, DULoxetine, ezetimibe, and neomycin-polymyxin b-dexamethasone.  Meds ordered this encounter  Medications  . neomycin-polymyxin b-dexamethasone (MAXITROL) 3.5-10000-0.1 OINT     Follow-up: No Follow-up on file.  [name], MD

## 2017-02-22 DIAGNOSIS — Z6825 Body mass index (BMI) 25.0-25.9, adult: Secondary | ICD-10-CM | POA: Diagnosis not present

## 2017-02-22 DIAGNOSIS — E663 Overweight: Secondary | ICD-10-CM | POA: Diagnosis not present

## 2017-02-22 DIAGNOSIS — M5030 Other cervical disc degeneration, unspecified cervical region: Secondary | ICD-10-CM | POA: Diagnosis not present

## 2017-02-22 DIAGNOSIS — M0609 Rheumatoid arthritis without rheumatoid factor, multiple sites: Secondary | ICD-10-CM | POA: Diagnosis not present

## 2017-02-22 DIAGNOSIS — M79642 Pain in left hand: Secondary | ICD-10-CM | POA: Diagnosis not present

## 2017-02-24 DIAGNOSIS — J301 Allergic rhinitis due to pollen: Secondary | ICD-10-CM | POA: Diagnosis not present

## 2017-02-24 DIAGNOSIS — R05 Cough: Secondary | ICD-10-CM | POA: Diagnosis not present

## 2017-03-01 ENCOUNTER — Telehealth: Payer: Self-pay | Admitting: Internal Medicine

## 2017-03-01 DIAGNOSIS — Z23 Encounter for immunization: Secondary | ICD-10-CM

## 2017-03-01 NOTE — Telephone Encounter (Signed)
Pt came by office and stated he has spoken with his Rheumatologist regarding getting the Shingles/Shingrix vaccine & per his Rheumatologist, he can get it "mid-cycle " of taking his Orencia.  Pt is wanting to get this injection and his preferred pharmacy to get it at would be [facility], if they are administering the vaccine there, and if not, he would like to get it at the [facility] on [address].  Please respond to [name] via [facility].

## 2017-03-01 NOTE — Telephone Encounter (Signed)
OK to get a shot Thx

## 2017-03-08 MED ORDER — ZOSTER VAC RECOMB ADJUVANTED 50 MCG/0.5ML IM SUSR: 0.5000 mL | Freq: Once | INTRAMUSCULAR | 1 refills | Status: DC

## 2017-03-08 MED ORDER — ZOSTER VAC RECOMB ADJUVANTED 50 MCG/0.5ML IM SUSR: 0.5000 mL | Freq: Once | INTRAMUSCULAR | 1 refills | Status: AC

## 2017-03-08 NOTE — Addendum Note (Signed)
Addended by: Karren Cobble on: 03/08/2017 10:14 AM   Modules accepted: Orders

## 2017-03-08 NOTE — Telephone Encounter (Signed)
Patient has called in about this. [name] can we just print the rx. Patients pharmacy does not do the injection and he is unsure which pharmacy he is going to go too. Please advise patient when ready to pick up. Thank you.

## 2017-03-08 NOTE — Telephone Encounter (Signed)
LM notifying pt

## 2017-03-10 DIAGNOSIS — Z961 Presence of intraocular lens: Secondary | ICD-10-CM | POA: Diagnosis not present

## 2017-03-11 DIAGNOSIS — Z85828 Personal history of other malignant neoplasm of skin: Secondary | ICD-10-CM | POA: Diagnosis not present

## 2017-03-11 DIAGNOSIS — C44622 Squamous cell carcinoma of skin of right upper limb, including shoulder: Secondary | ICD-10-CM | POA: Diagnosis not present

## 2017-03-11 DIAGNOSIS — D485 Neoplasm of uncertain behavior of skin: Secondary | ICD-10-CM | POA: Diagnosis not present

## 2017-03-16 ENCOUNTER — Other Ambulatory Visit: Payer: Self-pay | Admitting: Internal Medicine

## 2017-03-19 ENCOUNTER — Other Ambulatory Visit: Payer: Self-pay | Admitting: Internal Medicine

## 2017-03-21 DIAGNOSIS — Z79899 Other long term (current) drug therapy: Secondary | ICD-10-CM | POA: Diagnosis not present

## 2017-03-21 DIAGNOSIS — M0609 Rheumatoid arthritis without rheumatoid factor, multiple sites: Secondary | ICD-10-CM | POA: Diagnosis not present

## 2017-03-24 ENCOUNTER — Other Ambulatory Visit: Payer: Self-pay | Admitting: Internal Medicine

## 2017-03-25 NOTE — Telephone Encounter (Signed)
Rec'd fax pt requesting refill on his Lorazepam.../lmb

## 2017-03-28 NOTE — Telephone Encounter (Signed)
Rec'd call pt requesting status on his lorazepam refill. Pls advise if ok to refill...[name]

## 2017-03-28 NOTE — Telephone Encounter (Signed)
OK to fill this/these prescription(s) with additional refills x2 Thank you!  

## 2017-03-29 NOTE — Telephone Encounter (Signed)
Called refill into pharmacy spoke w/David gave MD approval.../lmb

## 2017-04-01 DIAGNOSIS — M4302 Spondylolysis, cervical region: Secondary | ICD-10-CM | POA: Diagnosis not present

## 2017-04-04 ENCOUNTER — Other Ambulatory Visit: Payer: Self-pay | Admitting: Nurse Practitioner

## 2017-04-04 ENCOUNTER — Ambulatory Visit
Admission: RE | Admit: 2017-04-04 | Discharge: 2017-04-04 | Disposition: A | Discharge status: Home / Self Care | Payer: Medicare Other | Source: Ambulatory Visit | Attending: Nurse Practitioner | Admitting: Nurse Practitioner

## 2017-04-04 DIAGNOSIS — Q6211 Congenital occlusion of ureteropelvic junction: Secondary | ICD-10-CM | POA: Diagnosis not present

## 2017-04-04 DIAGNOSIS — M4302 Spondylolysis, cervical region: Secondary | ICD-10-CM

## 2017-04-04 DIAGNOSIS — M542 Cervicalgia: Secondary | ICD-10-CM | POA: Diagnosis not present

## 2017-04-04 DIAGNOSIS — N200 Calculus of kidney: Secondary | ICD-10-CM | POA: Diagnosis not present

## 2017-04-04 DIAGNOSIS — N5201 Erectile dysfunction due to arterial insufficiency: Secondary | ICD-10-CM | POA: Diagnosis not present

## 2017-04-18 DIAGNOSIS — M0609 Rheumatoid arthritis without rheumatoid factor, multiple sites: Secondary | ICD-10-CM | POA: Diagnosis not present

## 2017-04-27 DIAGNOSIS — M4302 Spondylolysis, cervical region: Secondary | ICD-10-CM | POA: Diagnosis not present

## 2017-04-27 DIAGNOSIS — M069 Rheumatoid arthritis, unspecified: Secondary | ICD-10-CM | POA: Diagnosis not present

## 2017-04-28 ENCOUNTER — Other Ambulatory Visit: Payer: Self-pay | Admitting: Neurosurgery

## 2017-04-28 DIAGNOSIS — M4302 Spondylolysis, cervical region: Secondary | ICD-10-CM

## 2017-04-29 DIAGNOSIS — L812 Freckles: Secondary | ICD-10-CM | POA: Diagnosis not present

## 2017-04-29 DIAGNOSIS — L821 Other seborrheic keratosis: Secondary | ICD-10-CM | POA: Diagnosis not present

## 2017-04-29 DIAGNOSIS — D692 Other nonthrombocytopenic purpura: Secondary | ICD-10-CM | POA: Diagnosis not present

## 2017-04-29 DIAGNOSIS — D485 Neoplasm of uncertain behavior of skin: Secondary | ICD-10-CM | POA: Diagnosis not present

## 2017-04-29 DIAGNOSIS — L570 Actinic keratosis: Secondary | ICD-10-CM | POA: Diagnosis not present

## 2017-04-29 DIAGNOSIS — D1801 Hemangioma of skin and subcutaneous tissue: Secondary | ICD-10-CM | POA: Diagnosis not present

## 2017-04-29 DIAGNOSIS — Z85828 Personal history of other malignant neoplasm of skin: Secondary | ICD-10-CM | POA: Diagnosis not present

## 2017-04-29 DIAGNOSIS — C4442 Squamous cell carcinoma of skin of scalp and neck: Secondary | ICD-10-CM | POA: Diagnosis not present

## 2017-05-02 ENCOUNTER — Ambulatory Visit
Admission: RE | Admit: 2017-05-02 | Discharge: 2017-05-02 | Disposition: A | Discharge status: Home / Self Care | Payer: Medicare Other | Source: Ambulatory Visit | Attending: Neurosurgery | Admitting: Neurosurgery

## 2017-05-02 DIAGNOSIS — M4302 Spondylolysis, cervical region: Secondary | ICD-10-CM

## 2017-05-02 DIAGNOSIS — M4802 Spinal stenosis, cervical region: Secondary | ICD-10-CM | POA: Diagnosis not present

## 2017-05-05 ENCOUNTER — Telehealth: Payer: Self-pay | Admitting: Internal Medicine

## 2017-05-05 DIAGNOSIS — M4302 Spondylolysis, cervical region: Secondary | ICD-10-CM | POA: Diagnosis not present

## 2017-05-05 DIAGNOSIS — M069 Rheumatoid arthritis, unspecified: Secondary | ICD-10-CM | POA: Diagnosis not present

## 2017-05-05 NOTE — Telephone Encounter (Signed)
New message   Wife called this in - doctor will be faxing over an official form, but she wants to know if her husband needs to have a appointment for this or will you just go off his last visit notes?  Does pt need to come in to be seen for surgical clearance     [facility] Pre-operative Risk Assessment    Request for surgical clearance:  1. What type of surgery is being performed? Having neck surgery  2. When is this surgery scheduled?  Has not been scheduled yet   3. Are there any medications that need to be held prior to surgery and how long? Not sure  4. Name of physician performing surgery?  Dr Glenna Fellows  5. What is your office phone and fax number? [place] [phone] fax [phone]  Howie Ill 05/05/2017, 11:18 AM  _________________________________________________________________   (provider comments below)

## 2017-05-05 NOTE — Telephone Encounter (Signed)
Pt takes Eliquis for afib. CHADS2 score is 2 (HTN, age) and CHADS2VASc score is 4 (CAD, age x2, HTN). Ok to hold Eliquis for 3 days prior to spinal procedure per protocol. Clearance faxed to Dr Rex Kras office.

## 2017-05-05 NOTE — Telephone Encounter (Signed)
New message   Dr. Rex Kras office is calling.      [facility] Pre-operative Risk Assessment    Request for surgical clearance:  1. What type of surgery is being performed? ACDF   2. When is this surgery scheduled? Waiting on clearance  3. Are there any medications that need to be held prior to surgery and how long? Blood thinner if he is currently taking one  Name of physician performing surgery? Dr. Carloyn Manner   4. What is your office phone and fax number? [place] 05/05/2017, 1:59 PM  _________________________________________________________________   (provider comments below)

## 2017-05-05 NOTE — Telephone Encounter (Signed)
Proceed with surgery without further cardiac testing if surgery is medically necessary. Will forward to anticoagulation clinic for perioperative anticoagulation recommendations as per protocol.  Thompson Grayer MD, [facility] 05/05/2017 2:47 PM

## 2017-05-10 ENCOUNTER — Telehealth: Payer: Self-pay | Admitting: Internal Medicine

## 2017-05-10 NOTE — Telephone Encounter (Signed)
lmom for patient to call back tomorrow

## 2017-05-10 NOTE — Telephone Encounter (Signed)
New message  Pt call requesting to speak with RN to f/u on faxed for surgery in July. Please call back to discuss

## 2017-05-12 NOTE — Telephone Encounter (Signed)
Returned call to patient and he is going to call over and have Dr Carloyn Manner fax surgical clearance to [phone] for Dr Rayann Heman to address on Monday

## 2017-05-12 NOTE — Telephone Encounter (Signed)
Follow up ° ° ° °Pt verbalized that he is returning call for rn °

## 2017-05-16 DIAGNOSIS — M0609 Rheumatoid arthritis without rheumatoid factor, multiple sites: Secondary | ICD-10-CM | POA: Diagnosis not present

## 2017-05-24 ENCOUNTER — Encounter: Payer: Self-pay | Admitting: Cardiology

## 2017-05-24 DIAGNOSIS — E7800 Pure hypercholesterolemia, unspecified: Secondary | ICD-10-CM | POA: Diagnosis not present

## 2017-05-24 DIAGNOSIS — I2510 Atherosclerotic heart disease of native coronary artery without angina pectoris: Secondary | ICD-10-CM | POA: Diagnosis not present

## 2017-05-24 DIAGNOSIS — J42 Unspecified chronic bronchitis: Secondary | ICD-10-CM | POA: Diagnosis not present

## 2017-05-24 DIAGNOSIS — I10 Essential (primary) hypertension: Secondary | ICD-10-CM | POA: Diagnosis not present

## 2017-05-24 DIAGNOSIS — M47812 Spondylosis without myelopathy or radiculopathy, cervical region: Secondary | ICD-10-CM | POA: Diagnosis not present

## 2017-05-24 DIAGNOSIS — Z79899 Other long term (current) drug therapy: Secondary | ICD-10-CM | POA: Diagnosis not present

## 2017-05-24 DIAGNOSIS — Z7901 Long term (current) use of anticoagulants: Secondary | ICD-10-CM | POA: Diagnosis not present

## 2017-05-24 DIAGNOSIS — I700 Atherosclerosis of aorta: Secondary | ICD-10-CM | POA: Diagnosis not present

## 2017-05-24 DIAGNOSIS — K219 Gastro-esophageal reflux disease without esophagitis: Secondary | ICD-10-CM | POA: Diagnosis not present

## 2017-05-24 DIAGNOSIS — Z01818 Encounter for other preprocedural examination: Secondary | ICD-10-CM | POA: Diagnosis not present

## 2017-05-24 DIAGNOSIS — I252 Old myocardial infarction: Secondary | ICD-10-CM | POA: Diagnosis not present

## 2017-05-24 DIAGNOSIS — M069 Rheumatoid arthritis, unspecified: Secondary | ICD-10-CM | POA: Diagnosis not present

## 2017-05-24 DIAGNOSIS — Z7982 Long term (current) use of aspirin: Secondary | ICD-10-CM | POA: Diagnosis not present

## 2017-05-24 DIAGNOSIS — I4891 Unspecified atrial fibrillation: Secondary | ICD-10-CM | POA: Diagnosis not present

## 2017-05-24 DIAGNOSIS — F419 Anxiety disorder, unspecified: Secondary | ICD-10-CM | POA: Diagnosis not present

## 2017-05-30 ENCOUNTER — Ambulatory Visit (INDEPENDENT_AMBULATORY_CARE_PROVIDER_SITE_OTHER): Payer: Medicare Other | Admitting: Cardiology

## 2017-05-30 ENCOUNTER — Encounter: Payer: Self-pay | Admitting: Cardiology

## 2017-05-30 VITALS — BP 134/82 | HR 110 | Ht 72.0 in | Wt 181.8 lb

## 2017-05-30 DIAGNOSIS — I10 Essential (primary) hypertension: Secondary | ICD-10-CM | POA: Diagnosis not present

## 2017-05-30 DIAGNOSIS — G4733 Obstructive sleep apnea (adult) (pediatric): Secondary | ICD-10-CM | POA: Diagnosis not present

## 2017-05-30 DIAGNOSIS — I25119 Atherosclerotic heart disease of native coronary artery with unspecified angina pectoris: Secondary | ICD-10-CM

## 2017-05-30 NOTE — Progress Notes (Signed)
Cardiology Office Note    Date:  05/30/2017   ID:  ASIR BINGLEY, DOB 1933-05-18, MRN [id number]  PCP:  Cassandria Anger, MD  Cardiologist:  Fransico Him, MD   Chief Complaint  Patient presents with  . Sleep Apnea  . Hypertension    History of Present Illness:  [name] is a 83 y.o. male who presents for followup of OSA. He has a history of atrial fibrillation and was referred by Dr. Rayann Heman for sleep study. He was found to have moderate OSA with an AHI of 18/hr. He is now on 8cm H2O.   He is here today for followup. He is doing well with his CPAP device. He continues to tolerate the nasal mask and feels the pressure is adequate. He continues to  Sleep well and feels rested in the am with less daytime sleepiness. He does not use a chin strap and does not think that he snores.  He admits to not using it as much as he should.  He spent some time in [place] recently and was not using it as much and also had a cold for several months in the winter as well. He says that he does sleep better with the device.     Past Medical History:  Diagnosis Date  . CAD (coronary artery disease) 12/2013--  cardiologist-  dr allred/ dr Nomi Rudnicki   s/p inferior STEMI 01/08/2014; [place] 01/08/14: total RCA occlusion s/p 3.5x64mm Xience DES distal RCA and 3.5x28 mm DES mid RCA, 60-70% mid LAD stenosis, EF 55%  . Complication of anesthesia    'long to wake up after back surgery " 09/2003  . Diverticulosis of colon   . GERD (gastroesophageal reflux disease)   . History of cellulitis    05-26-2015  LLE  . History of colon polyps    1998- benign/  2008 adenomatous   . History of kidney stones    2013  . History of squamous cell carcinoma excision    2013;  2015;  06-12-2015 right leg/  02/ and 05/ 2017  left ear and left leg  . History of ST elevation myocardial infarction (STEMI) 01/08/2014   acute infertor--  s/p  DES x2 to RCA  . Hydronephrosis, left   . Hyperlipidemia   . Hypertension   .  Migraine with aura   . OSA on CPAP 06/19/2015   Moderate OSA with AHI 18/hr  per study 05-20-2015  . Osteoarthritis   . Palpitations   . Paroxysmal atrial fibrillation ([place]) 4/16   chads2vasc score is at least 4  . Premature atrial contractions   . RA (rheumatoid arthritis) [facility])    rheumatologist-  dr Leigh Aurora  . S/P drug eluting coronary stent placement 01/08/2014   x2  to mid and distal RCA  . Sinusitis, chronic 10/17/2015  . Wears glasses   . Wears hearing aid    bilateral    Past Surgical History:  Procedure Laterality Date  . BACK SURGERY    . CARDIOVASCULAR STRESS TEST  11/21/2015   Low risk nuclear study w/ a small diaphragmatic attenuation artifact, no ischemia/  normal LV function and wall motion , ef 63%  . CATARACT EXTRACTION W/ INTRAOCULAR LENS  IMPLANT, BILATERAL  2015  . COLONOSCOPY  last one 06-09-2011  . CORONARY ANGIOPLASTY    . CYSTOSCOPY W/ RETROGRADES Left 07/12/2016   Procedure: CYSTOSCOPY WITH RETROGRADE PYELOGRAM LEFT URETERAL STENT;  Surgeon: Irine Seal, MD;  Location: [facility];  Service:  Urology;  Laterality: Left;  . CYSTOSCOPY WITH RETROGRADE PYELOGRAM, URETEROSCOPY AND STENT PLACEMENT Left 08/11/2016   Procedure: CYSTOSCOPY WITH RETROGRADE PYELOGRAM,  DIAGNOSTIC URETEROSCOPY , STENT EXCHANGE;  Surgeon: Alexis Frock, MD;  Location[facility];  Service: Urology;  Laterality: Left;  . 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   severe fibromatosis palm and fingers  . LEFT HEART CATHETERIZATION WITH CORONARY ANGIOGRAM N/A 01/08/2014   Procedure: LEFT HEART CATHETERIZATION WITH CORONARY ANGIOGRAM;  Surgeon: Troy Sine, MD;  Location: [facility] LAB;  Service: Cardiovascular;  Laterality:N/A;  total/ subtotal RCA/  mLAD 19-62% w/ mid systolic bridging/  preserved global LVF, ef 55%  . MOHS SURGERY  x2  feb and may 2017   left ear /  left leg  ([facility])  . PERCUTANEOUS CORONARY STENT INTERVENTION (PCI-S)  01/08/2014   Procedure: PERCUTANEOUS  CORONARY STENT INTERVENTION (PCI-S);  Surgeon: Troy Sine, MD;  Location: [facility] CATH LAB;  Service: Cardiovascular;;  DES to mid and distal RCA  . POSTERIOR LUMBAR FUSION  10/01/2003   and Laminectomy/ diskectomy  L4 -- S1  . ROBOT ASSISTED PYELOPLASTY Left 09/15/2016   Procedure: XI ROBOTIC ASSISTED PYELOPLASTY;  Surgeon: Alexis Frock, MD;  Location: [facility];  Service: Urology;  Laterality: Left;  . TONSILLECTOMY    . TRANSTHORACIC ECHOCARDIOGRAM  02/23/2016   mild LVH,  ef 50-55%,  grade 1 diastolic dysfunction/  mild to moderate AV calcification w/ no stenosis or regurg./  trivial MR and TR/  mild PR    Current Medications: Current Meds  Medication Sig  . Abatacept (ORENCIA ) Inject 1 Applicatorful into the skin See admin instructions. Every 4 weeks Next injection due 09-29-16  . apixaban (ELIQUIS) 5 MG TABS tablet Take 1 tablet (5 mg total) by mouth 2 (two) times daily.  . DULoxetine (CYMBALTA) 60 MG capsule Take 1 capsule (60 mg total) by mouth every morning.  . ezetimibe (ZETIA) 10 MG tablet TAKE ONE-HALF TABLET DAILY  . fexofenadine (ALLEGRA) 180 MG tablet Take 180 mg by mouth daily as needed for allergies or rhinitis.   [name] lisinopril (PRINIVIL,ZESTRIL) 5 MG tablet Take 1 tablet (5 mg total) by mouth daily.  [name] LORazepam (ATIVAN) 0.5 MG tablet TAKE ONE OR TWO TABLETS TWICE DAILY AS NEEDED FOR ANXIETY  . metoprolol tartrate (LOPRESSOR) 25 MG tablet TAKE ONE AND ONE-HALF TABLET TWICE DAILY (Patient taking differently: TAKE ONE AND ONE-HALF TABLET TWICE DAILY- (total 37.5mg ))  . NITROSTAT 0.4 MG SL tablet ONE TABLET UNDER TONGUE EVERY 5 MINUTES FOR 3 DOSES AS NEEDED FOR CHEST PAIN  . omeprazole (PRILOSEC) 40 MG capsule TAKE ONE CAPSULE EACH DAY  . oxyCODONE-acetaminophen (PERCOCET/ROXICET) 5-325 MG tablet Take 1-2 tablets by mouth every 8 (eight) hours as needed for moderate pain or severe pain.  . predniSONE (DELTASONE) 1 MG tablet Take 3 mg by mouth every morning.   [name] Propylene  Glycol (SYSTANE BALANCE OP) Place 1 drop into both eyes daily as needed (dry eyeS).   [name] rosuvastatin (CRESTOR) 10 MG tablet TAKE ONE TABLET EACH DAY  . zolpidem (AMBIEN) 10 MG tablet Take 1 tablet (10 mg total) by mouth at bedtime as needed. for sleep    Allergies:   Methotrexate; Lisinopril; Atorvastatin; and Pravastatin sodium   Social History   Social History  . Marital status: Married    Spouse name: N/A  . Number of children: 2  . Years of education: N/A   Occupational History  . Retired Retired   Science writer History  Main Topics  . Smoking status: Former Smoker    Years: 10.00    Types: Cigarettes    Quit date: 08/09/1968  . Smokeless tobacco: Never Used  . Alcohol use 4.2 - 8.4 oz/week    7 - 14 Glasses of wine per week     Comment: 1-2 glasses of wine nightly  . Drug use: No  . Sexual activity: Yes   Other Topics Concern  . None   Social History Narrative   1 Caffeine drink daily      Family History:  The patient's family history includes Heart disease in his father; Hypertension in his mother.   ROS:   Please see the history of present illness.    ROS All other systems reviewed and are negative.  No flowsheet data found.     PHYSICAL EXAM:   VS:  BP 134/82   Pulse (!) 110   Ht 6' (1.829 m)   Wt 181 lb 12.8 oz (82.5 kg)   SpO2 96%   BMI 24.66 kg/m    GEN: Well nourished, well developed, in no acute distress  HEENT: normal  Neck: no JVD, carotid bruits, or masses Cardiac: RRR; no murmurs, rubs, or gallops,no edema.  Intact distal pulses bilaterally.  Respiratory:  clear to auscultation bilaterally, normal work of breathing GI: soft, nontender, nondistended, + BS MS: no deformity or atrophy  Skin: warm and dry, no rash Neuro:  Alert and Oriented x 3, Strength and sensation are intact Psych: euthymic mood, full affect  Wt Readings from Last 3 Encounters:  05/30/17 181 lb 12.8 oz (82.5 kg)  02/21/17 184 lb 0.6 oz (83.5 kg)  12/06/16 182 lb (82.6 kg)        Studies/Labs Reviewed:   EKG:  EKG is  ordered today and showed sinus tachycardia at 102bpm with no ST changes  Recent Labs: 02/21/2017: ALT 23; BUN 15; Creatinine, Ser 0.85; Hemoglobin 14.2; Platelets 287.0; Potassium 4.6; Sodium 142   Lipid Panel    Component Value Date/Time   CHOL 164 02/21/2017 1349   TRIG 173.0 (H) 02/21/2017 1349   HDL 52.20 02/21/2017 1349   CHOLHDL 3 02/21/2017 1349   VLDL 34.6 02/21/2017 1349   LDLCALC 78 02/21/2017 1349   LDLDIRECT 168.9 05/24/2013 0732    Additional studies/ records that were reviewed today include:  CPAP download    ASSESSMENT:    1. OSA (obstructive sleep apnea)   2. Essential hypertension      PLAN:  In order of problems listed above:  OSA - the patient is tolerating PAP therapy well without any problems. The PAP download was reviewed today and showed an AHI of 3.8/hr on 8 cm H2O with 40% compliance in using more than 4 hours nightly.  The patient has been using and benefiting from CPAP use and will continue to benefit from therapy. I have encouraged him to be more compliant with his device.   2.   HTN - his BP is well controlled on exam today.  He will continue on metoprolol 37.5mg  BID     Medication Adjustments/Labs and Tests Ordered: Current medicines are reviewed at length with the patient today.  Concerns regarding medicines are outlined above.  Medication changes, Labs and Tests ordered today are listed in the Patient Instructions below.  There are no Patient Instructions on file for this visit.   Signed, Fransico Him, MD  05/30/2017 9:07 AM    [place],  [place]  [address] Phone: [phone]; Fax: [phone]

## 2017-05-30 NOTE — Patient Instructions (Signed)

## 2017-05-31 DIAGNOSIS — M4712 Other spondylosis with myelopathy, cervical region: Secondary | ICD-10-CM | POA: Diagnosis present

## 2017-05-31 DIAGNOSIS — K219 Gastro-esophageal reflux disease without esophagitis: Secondary | ICD-10-CM | POA: Diagnosis present

## 2017-05-31 DIAGNOSIS — M953 Acquired deformity of neck: Secondary | ICD-10-CM | POA: Diagnosis present

## 2017-05-31 DIAGNOSIS — Z955 Presence of coronary angioplasty implant and graft: Secondary | ICD-10-CM | POA: Diagnosis not present

## 2017-05-31 DIAGNOSIS — F419 Anxiety disorder, unspecified: Secondary | ICD-10-CM | POA: Diagnosis present

## 2017-05-31 DIAGNOSIS — M47812 Spondylosis without myelopathy or radiculopathy, cervical region: Secondary | ICD-10-CM | POA: Diagnosis not present

## 2017-05-31 DIAGNOSIS — Z7902 Long term (current) use of antithrombotics/antiplatelets: Secondary | ICD-10-CM | POA: Diagnosis not present

## 2017-05-31 DIAGNOSIS — Z8249 Family history of ischemic heart disease and other diseases of the circulatory system: Secondary | ICD-10-CM | POA: Diagnosis not present

## 2017-05-31 DIAGNOSIS — Z79899 Other long term (current) drug therapy: Secondary | ICD-10-CM | POA: Diagnosis not present

## 2017-05-31 DIAGNOSIS — M47892 Other spondylosis, cervical region: Secondary | ICD-10-CM | POA: Diagnosis not present

## 2017-05-31 DIAGNOSIS — Z79891 Long term (current) use of opiate analgesic: Secondary | ICD-10-CM | POA: Diagnosis not present

## 2017-05-31 DIAGNOSIS — M50323 Other cervical disc degeneration at C6-C7 level: Secondary | ICD-10-CM | POA: Diagnosis not present

## 2017-05-31 DIAGNOSIS — E7800 Pure hypercholesterolemia, unspecified: Secondary | ICD-10-CM | POA: Diagnosis present

## 2017-05-31 DIAGNOSIS — M069 Rheumatoid arthritis, unspecified: Secondary | ICD-10-CM | POA: Diagnosis present

## 2017-05-31 DIAGNOSIS — Z951 Presence of aortocoronary bypass graft: Secondary | ICD-10-CM | POA: Diagnosis not present

## 2017-05-31 DIAGNOSIS — Z981 Arthrodesis status: Secondary | ICD-10-CM | POA: Diagnosis not present

## 2017-05-31 DIAGNOSIS — Z833 Family history of diabetes mellitus: Secondary | ICD-10-CM | POA: Diagnosis not present

## 2017-05-31 DIAGNOSIS — I2510 Atherosclerotic heart disease of native coronary artery without angina pectoris: Secondary | ICD-10-CM | POA: Diagnosis present

## 2017-05-31 DIAGNOSIS — Z7982 Long term (current) use of aspirin: Secondary | ICD-10-CM | POA: Diagnosis not present

## 2017-05-31 DIAGNOSIS — I480 Paroxysmal atrial fibrillation: Secondary | ICD-10-CM | POA: Diagnosis present

## 2017-05-31 DIAGNOSIS — Z809 Family history of malignant neoplasm, unspecified: Secondary | ICD-10-CM | POA: Diagnosis not present

## 2017-05-31 DIAGNOSIS — I10 Essential (primary) hypertension: Secondary | ICD-10-CM | POA: Diagnosis present

## 2017-05-31 DIAGNOSIS — Z888 Allergy status to other drugs, medicaments and biological substances status: Secondary | ICD-10-CM | POA: Diagnosis not present

## 2017-05-31 DIAGNOSIS — I252 Old myocardial infarction: Secondary | ICD-10-CM | POA: Diagnosis not present

## 2017-05-31 DIAGNOSIS — Z87891 Personal history of nicotine dependence: Secondary | ICD-10-CM | POA: Diagnosis not present

## 2017-05-31 DIAGNOSIS — M4302 Spondylolysis, cervical region: Secondary | ICD-10-CM | POA: Diagnosis not present

## 2017-06-01 ENCOUNTER — Ambulatory Visit: Payer: Medicare Other | Admitting: Internal Medicine

## 2017-06-09 ENCOUNTER — Other Ambulatory Visit (INDEPENDENT_AMBULATORY_CARE_PROVIDER_SITE_OTHER): Payer: Medicare Other

## 2017-06-09 DIAGNOSIS — R739 Hyperglycemia, unspecified: Secondary | ICD-10-CM | POA: Diagnosis not present

## 2017-06-09 LAB — BASIC METABOLIC PANEL
BUN: 18 mg/dL (ref 6–23)
CO2: 26 mEq/L (ref 19–32)
Calcium: 9.4 mg/dL (ref 8.4–10.5)
Chloride: 105 mEq/L (ref 96–112)
Creatinine, Ser: 0.98 mg/dL (ref 0.40–1.50)
GFR: 77.87 mL/min (ref >60.00)
Glucose, Bld: 104 mg/dL — ABNORMAL HIGH (ref 70–99)
Potassium: 3.6 mEq/L (ref 3.5–5.1)
Sodium: 139 mEq/L (ref 135–145)

## 2017-06-09 LAB — HEMOGLOBIN A1C: Hgb A1c MFr Bld: 6.8 % — ABNORMAL HIGH (ref 4.6–6.5)

## 2017-06-10 DIAGNOSIS — M47812 Spondylosis without myelopathy or radiculopathy, cervical region: Secondary | ICD-10-CM | POA: Diagnosis not present

## 2017-06-10 DIAGNOSIS — M4302 Spondylolysis, cervical region: Secondary | ICD-10-CM | POA: Diagnosis not present

## 2017-06-10 DIAGNOSIS — Z981 Arthrodesis status: Secondary | ICD-10-CM | POA: Diagnosis not present

## 2017-06-13 ENCOUNTER — Ambulatory Visit: Payer: Medicare Other | Admitting: Internal Medicine

## 2017-06-13 DIAGNOSIS — Z79899 Other long term (current) drug therapy: Secondary | ICD-10-CM | POA: Diagnosis not present

## 2017-06-13 DIAGNOSIS — M0609 Rheumatoid arthritis without rheumatoid factor, multiple sites: Secondary | ICD-10-CM | POA: Diagnosis not present

## 2017-06-17 ENCOUNTER — Other Ambulatory Visit: Payer: Self-pay | Admitting: Internal Medicine

## 2017-06-22 ENCOUNTER — Ambulatory Visit (INDEPENDENT_AMBULATORY_CARE_PROVIDER_SITE_OTHER): Payer: Medicare Other | Admitting: Internal Medicine

## 2017-06-22 ENCOUNTER — Encounter: Payer: Self-pay | Admitting: Internal Medicine

## 2017-06-22 DIAGNOSIS — R739 Hyperglycemia, unspecified: Secondary | ICD-10-CM | POA: Insufficient documentation

## 2017-06-22 DIAGNOSIS — E782 Mixed hyperlipidemia: Secondary | ICD-10-CM | POA: Diagnosis not present

## 2017-06-22 DIAGNOSIS — M069 Rheumatoid arthritis, unspecified: Secondary | ICD-10-CM

## 2017-06-22 DIAGNOSIS — F4321 Adjustment disorder with depressed mood: Secondary | ICD-10-CM | POA: Diagnosis not present

## 2017-06-22 DIAGNOSIS — I25119 Atherosclerotic heart disease of native coronary artery with unspecified angina pectoris: Secondary | ICD-10-CM | POA: Diagnosis not present

## 2017-06-22 NOTE — Patient Instructions (Signed)
Hemoglobin A1c Test Some of the sugar (glucose) that circulates in your blood sticks or binds to blood proteins. Hemoglobin (Hb or Hgb) is one type of blood protein that glucose binds to. It also carries oxygen in the red blood cells (RBCs). When glucose binds to Hb, the glucose-coated Hb is called glycated Hb. Once Hb is glycated, it remains that way for the life of the RBC. This is about 120 days. Rather than testing your blood glucose level on one single day, the hemoglobin A1c (HbA1c) test measures the average amount of glycated hemoglobin and, therefore, the average amount of glucose in your blood during the 3-4 months just before the test is done. The HbA1c test is used to monitor long-term control of blood sugar in people who have diabetes mellitus. The HbA1c test can also be used in addition to or in combination with fasting blood glucose level and oral glucose tolerance tests. What do the results mean? It is your responsibility to obtain your test results. Ask the lab or department performing the test when and how you will get your results. Contact your health care provider to discuss any questions you have about your results. Range of Normal Values Ranges for normal values may vary among different labs and hospitals. You should always check with your health care provider after having lab work or other tests done to discuss the meaning of your test results and whether your values are considered within normal limits. The ranges for normal HbA1c test results are as follows:  Adult or child without diabetes: 4-5.9%.  Adult or child with diabetes and good blood glucose control: less than 6.5%.  Several factors can affect HbA1c test results. These may include:  Diseases (hemoglobinopathies) that cause a change in the shape, size, or amount of Hb in your blood.  Longer than normal RBC life span.  Abnormally low levels of certain proteins in your blood.  Eating foods or taking supplements that  are high in vitamin C (ascorbic acid).  Meaning of Results Outside Normal Value Ranges Abnormally high HbA1c values are most commonly an indication of prediabetes mellitus and diabetes mellitus:  An HbA1c result of 5.7-6.4% is considered diagnostic of prediabetes mellitus.  An HbA1c result of 6.5% or higher on two separate occasions is considered diagnostic of diabetes mellitus.  Abnormally low HbA1c values can be caused by several health conditions. These may include:  Pregnancy.  A large amount of blood loss.  Blood transfusions.  Low red blood cell count (anemia). This is caused by premature destruction of red blood cells.  Long-term kidney failure.  Some unusual forms of Hb (Hb variants), such as sickle cell trait.  Discuss your test results with your health care provider. He or she will use the results to make a diagnosis and determine a treatment plan that is right for you. Talk with your health care provider to discuss your results, treatment options, and if necessary, the need for more tests. Talk with your health care provider if you have any questions about your results. This information is not intended to replace advice given to you by your health care provider. Make sure you discuss any questions you have with your health care provider. Document Released: 11/23/2004 Document Revised: 07/28/2016 Document Reviewed: 03/18/2014 Elsevier Interactive Patient Education  2017 Elsevier Inc.  

## 2017-06-22 NOTE — Progress Notes (Signed)
Subjective:  Patient ID: [name], male    DOB: Apr 13, 1933  Age: 83 y.o. MRN: [id number]  CC: No chief complaint on file.   HPI [facility] presents for elevated sugars F/u neck surgery F/u OA, chronic pain...  Outpatient Medications Prior to Visit  Medication Sig Dispense Refill  . Abatacept (ORENCIA [place]) Inject 1 Applicatorful into the skin See admin instructions. Every 4 weeks Next injection due 09-29-16    . apixaban (ELIQUIS) 5 MG TABS tablet Take 1 tablet (5 mg total) by mouth 2 (two) times daily. 180 tablet 3  . DULoxetine (CYMBALTA) 60 MG capsule Take 1 capsule (60 mg total) by mouth every morning. 90 capsule 3  . ezetimibe (ZETIA) 10 MG tablet TAKE ONE-HALF TABLET DAILY 45 tablet 3  . fexofenadine (ALLEGRA) 180 MG tablet Take 180 mg by mouth daily as needed for allergies or rhinitis.     [name] lisinopril (PRINIVIL,ZESTRIL) 5 MG tablet Take 1 tablet (5 mg total) by mouth daily. 90 tablet 3  . LORazepam (ATIVAN) 0.5 MG tablet TAKE ONE OR TWO TABLETS TWICE DAILY AS NEEDED FOR ANXIETY 60 tablet 2  . metoprolol tartrate (LOPRESSOR) 25 MG tablet TAKE ONE AND ONE-HALF TABLET TWICE DAILY 270 tablet 3  . NITROSTAT 0.4 MG SL tablet ONE TABLET UNDER TONGUE EVERY 5 MINUTES FOR 3 DOSES AS NEEDED FOR CHEST PAIN 25 tablet 4  . omeprazole (PRILOSEC) 40 MG capsule TAKE ONE CAPSULE EACH DAY 90 capsule 2  . oxyCODONE-acetaminophen (PERCOCET/ROXICET) 5-325 MG tablet Take 1-2 tablets by mouth every 8 (eight) hours as needed for moderate pain or severe pain. 90 tablet 0  . predniSONE (DELTASONE) 1 MG tablet Take 3 mg by mouth every morning.   0  . Propylene Glycol (SYSTANE BALANCE OP) Place 1 drop into both eyes daily as needed (dry eyeS).     [name] rosuvastatin (CRESTOR) 10 MG tablet TAKE ONE TABLET EACH DAY 90 tablet 3  . zolpidem (AMBIEN) 10 MG tablet Take 1 tablet (10 mg total) by mouth at bedtime as needed. for sleep 90 tablet 1   No facility-administered medications prior to visit.      ROS Review of Systems  Constitutional: Negative for appetite change, fatigue and unexpected weight change.  HENT: Negative for congestion, nosebleeds, sneezing, sore throat and trouble swallowing.   Eyes: Negative for itching and visual disturbance.  Respiratory: Negative for cough.   Cardiovascular: Negative for chest pain, palpitations and leg swelling.  Gastrointestinal: Negative for abdominal distention, blood in stool, diarrhea and nausea.  Genitourinary: Negative for frequency and hematuria.  Musculoskeletal: Positive for arthralgias, back pain and neck stiffness. Negative for gait problem, joint swelling and neck pain.  Skin: Negative for rash.  Neurological: Negative for dizziness, tremors, speech difficulty and weakness.  Psychiatric/Behavioral: Negative for agitation, dysphoric mood and sleep disturbance. The patient is not nervous/anxious.     Objective:  BP 128/78 (BP Location: Left Arm, Patient Position: Sitting, Cuff Size: Normal)   Pulse 66   Temp 97.9 F (36.6 C) (Oral)   Ht 6' (1.829 m)   Wt 179 lb (81.2 kg)   SpO2 99%   BMI 24.28 kg/m   BP Readings from Last 3 Encounters:  06/22/17 128/78  05/30/17 134/82  02/21/17 124/78    Wt Readings from Last 3 Encounters:  06/22/17 179 lb (81.2 kg)  05/30/17 181 lb 12.8 oz (82.5 kg)  02/21/17 184 lb 0.6 oz (83.5 kg)    Physical Exam  Constitutional: He  is oriented to person, place, and time. He appears well-developed. No distress.  NAD  HENT:  Mouth/Throat: Oropharynx is clear and moist.  Eyes: Pupils are equal, round, and reactive to light. Conjunctivae are normal.  Neck: Normal range of motion. No JVD present. No thyromegaly present.  Cardiovascular: Normal rate, regular rhythm, normal heart sounds and intact distal pulses.  Exam reveals no gallop and no friction rub.   No murmur heard. Pulmonary/Chest: Effort normal and breath sounds normal. No respiratory distress. He has no wheezes. He has no rales. He  exhibits no tenderness.  Abdominal: Soft. Bowel sounds are normal. He exhibits no distension and no mass. There is no tenderness. There is no rebound and no guarding.  Musculoskeletal: Normal range of motion. He exhibits tenderness. He exhibits no edema.  Lymphadenopathy:    He has no cervical adenopathy.  Neurological: He is alert and oriented to person, place, and time. He has normal reflexes. No cranial nerve deficit. He exhibits normal muscle tone. He displays a negative Romberg sign. Coordination and gait normal.  Skin: Skin is warm and dry. No rash noted.  Psychiatric: He has a normal mood and affect. His behavior is normal. Judgment and thought content normal.  scar on neck  Lab Results  Component Value Date   WBC 6.0 02/21/2017   HGB 14.2 02/21/2017   HCT 42.3 02/21/2017   PLT 287.0 02/21/2017   GLUCOSE 104 (H) 06/09/2017   CHOL 164 02/21/2017   TRIG 173.0 (H) 02/21/2017   HDL 52.20 02/21/2017   LDLDIRECT 168.9 05/24/2013   LDLCALC 78 02/21/2017   ALT 23 02/21/2017   AST 20 02/21/2017   NA 139 06/09/2017   K 3.6 06/09/2017   CL 105 06/09/2017   CREATININE 0.98 06/09/2017   BUN 18 06/09/2017   CO2 26 06/09/2017   TSH 1.015 10/07/2015   PSA 2.46 02/21/2017   INR 0.92 09/13/2016   HGBA1C 6.8 (H) 06/09/2017    Mr Cervical Spine Wo Contrast  Result Date: 05/02/2017 CLINICAL DATA:  Initial evaluation for neck pain with right shoulder and arm pain for 6-8 weeks. EXAM: MRI CERVICAL SPINE WITHOUT CONTRAST TECHNIQUE: Multiplanar, multisequence MR imaging of the cervical spine was performed. No intravenous contrast was administered. COMPARISON:  Comparison prior radiograph from 04/04/2017 as well as previous MRI from 03/21/2012. FINDINGS: Alignment: Straightening with reversal of the normal cervical lordosis, apex at C4-5. 3 mm anterolisthesis of C3 on C4. Trace anterolisthesis of C7 on T1. Vertebrae: Vertebral body heights maintained. No evidence for acute or chronic fracture.  Chronic reactive endplate changes present about the C4-5, C5-6, and C6-7 interspaces. The reactive marrow edema about the right C2-3 facet due to facet degeneration (series 3, image 1). No abnormal marrow edema. No discrete or worrisome osseous lesions. Cord: Signal intensity within the cervical spinal cord is normal. Posterior Fossa, vertebral arteries, paraspinal tissues: Probable chronic microvascular ischemic changes partially visualize within the pons/ brainstem. Visualized brain and posterior fossa otherwise unremarkable. Craniocervical junction normal. Question asymmetric edema about right posterior paraspinous musculature (series 6, image 28), incompletely visualized. This appears to largely encompass the right levator scapulae muscle. Paraspinous soft tissues otherwise normal. Normal intravascular flow voids present within the vertebral arteries bilaterally. Disc levels: C2-C3: Mild degenerative disc bulge, stable. Right worse than left uncovertebral hypertrophy, also unchanged. Progressive bilateral facet degeneration, worse on the right, with associated reactive edema about the right C2-3 facet. No significant canal stenosis. Progressive mild to moderate bilateral C3 foraminal stenosis, right slightly worse  than left. C3-C4: Shallow posterior disc bulge. Uncovertebral hypertrophy on the left. Bilateral facet degeneration, also worse on the left. Bulging disc flattens and indents the ventral thecal sac with resultant mild canal stenosis on the left, stable. Moderate left with mild right C4 foraminal stenosis, slightly progressed. C4-C5: Chronic diffuse degenerative disc osteophyte with intervertebral disc space narrowing and reactive endplate changes, progressed from previous. Associated intervertebral disc space height loss. Posterior disc osteophyte complex flattens the ventral thecal sac, slightly eccentric to the right. Resultant mild spinal stenosis with flattening of the cervical spinal cord without  cord signal changes. Mild to moderate right with mild left C5 foraminal stenosis, similar to previous. C5-C6: Chronic diffuse degenerative disc osteophyte with intervertebral disc space narrowing and reactive endplate changes. Broad posterior component flattens and partially faces the ventral thecal sac with resultant mild canal stenosis. Mild flattening of the cervical spinal cord without cord signal changes. Appearance is similar to previous. Mild to moderate bilateral C6 foraminal narrowing, slightly worse on the left, similar to previous. C6-C7: Chronic diffuse degenerative disc osteophyte, a centric to the right. Flattening of the ventral thecal sac, greater on the right. Mild right-sided cord flattening without cord signal changes. Right greater than left uncovertebral spurring. Mild bilateral foraminal narrowing, greater on the right. C7-T1: Trace anterolisthesis of C7 on T1. Bilateral facet degeneration. Lobulated posterior disc bulge with bilateral uncinate spurring. No significant canal stenosis. Mild bilateral foraminal narrowing, slightly worse than left, similar to prior. Visualized upper thoracic spine within normal limits. IMPRESSION: 1. 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 Resultant mild canal stenosis at C3-4 through C6-7. 2. Multifactorial degenerative changes with resultant multilevel foraminal narrowing as detailed above. Notable findings include progressive mild to moderate bilateral C3 foraminal stenosis, moderate left C4 foraminal narrowing, and mild to moderate bilateral C6 foraminal stenosis. 3. Prominent right-sided facet degeneration at C2-3 on the right with associated reactive marrow edema. Finding could serve as a source for neck pain. 4. Apparent asymmetric edema about the right posterior musculature of the upper back, primarily encompassing the partially visualized distal right levator scapulae muscle.  Finding may be related to muscular injury/strain. Electronically Signed   By: Jeannine Boga M.D.   On: 05/02/2017 21:54    Assessment & Plan:   There are no diagnoses linked to this encounter. I am having Mr. Dragan maintain his Abatacept (ORENCIA [place]), Propylene Glycol (SYSTANE BALANCE OP), predniSONE, fexofenadine, NITROSTAT, zolpidem, apixaban, lisinopril, rosuvastatin, DULoxetine, ezetimibe, oxyCODONE-acetaminophen, omeprazole, LORazepam, and metoprolol tartrate.  No orders of the defined types were placed in this encounter.    Follow-up: No Follow-up on file.  [name], MD

## 2017-06-22 NOTE — Assessment & Plan Note (Signed)
- 

## 2017-06-22 NOTE — Assessment & Plan Note (Signed)
cymbalta 

## 2017-06-22 NOTE — Assessment & Plan Note (Addendum)
We discussed DM treatment options, ie Metformin - Pt declined Will cont to monitor NCS diet On Prednisone 3-5 mg - he will cut back

## 2017-06-22 NOTE — Assessment & Plan Note (Signed)
on [place] On Prednisone 3-5 mg - he will cut back

## 2017-06-28 DIAGNOSIS — M4302 Spondylolysis, cervical region: Secondary | ICD-10-CM | POA: Insufficient documentation

## 2017-07-11 DIAGNOSIS — M0609 Rheumatoid arthritis without rheumatoid factor, multiple sites: Secondary | ICD-10-CM | POA: Diagnosis not present

## 2017-07-17 ENCOUNTER — Encounter: Payer: Self-pay | Admitting: Internal Medicine

## 2017-07-22 ENCOUNTER — Encounter: Payer: Self-pay | Admitting: Internal Medicine

## 2017-07-25 ENCOUNTER — Telehealth: Payer: Self-pay | Admitting: Internal Medicine

## 2017-07-25 DIAGNOSIS — Z6824 Body mass index (BMI) 24.0-24.9, adult: Secondary | ICD-10-CM | POA: Diagnosis not present

## 2017-07-25 DIAGNOSIS — E663 Overweight: Secondary | ICD-10-CM | POA: Diagnosis not present

## 2017-07-25 DIAGNOSIS — M79642 Pain in left hand: Secondary | ICD-10-CM | POA: Diagnosis not present

## 2017-07-25 DIAGNOSIS — M0609 Rheumatoid arthritis without rheumatoid factor, multiple sites: Secondary | ICD-10-CM | POA: Diagnosis not present

## 2017-07-25 DIAGNOSIS — M5030 Other cervical disc degeneration, unspecified cervical region: Secondary | ICD-10-CM | POA: Diagnosis not present

## 2017-07-25 NOTE — Telephone Encounter (Signed)
New message      Pt is on the keto diet.  Talk to the nurse about this diet and to make sure it is ok.  Please call

## 2017-07-28 NOTE — Telephone Encounter (Signed)
Left message for patient to return my call at his convenience

## 2017-08-08 DIAGNOSIS — M0609 Rheumatoid arthritis without rheumatoid factor, multiple sites: Secondary | ICD-10-CM | POA: Diagnosis not present

## 2017-08-16 ENCOUNTER — Encounter: Payer: Self-pay | Admitting: Internal Medicine

## 2017-08-19 NOTE — Telephone Encounter (Signed)
Left another message if patient needed to discuss diet he could call me back at his convenience.

## 2017-08-23 ENCOUNTER — Ambulatory Visit: Payer: Medicare Other | Admitting: Internal Medicine

## 2017-08-30 ENCOUNTER — Other Ambulatory Visit: Payer: Self-pay | Admitting: Internal Medicine

## 2017-08-30 ENCOUNTER — Other Ambulatory Visit (INDEPENDENT_AMBULATORY_CARE_PROVIDER_SITE_OTHER): Payer: Medicare Other

## 2017-08-30 DIAGNOSIS — R739 Hyperglycemia, unspecified: Secondary | ICD-10-CM | POA: Diagnosis not present

## 2017-08-30 LAB — BASIC METABOLIC PANEL
BUN: 18 mg/dL (ref 6–23)
CO2: 26 mEq/L (ref 19–32)
Calcium: 9.2 mg/dL (ref 8.4–10.5)
Chloride: 104 mEq/L (ref 96–112)
Creatinine, Ser: 0.73 mg/dL (ref 0.40–1.50)
GFR: 109.32 mL/min (ref >60.00)
Glucose, Bld: 85 mg/dL (ref 70–99)
Potassium: 3.6 mEq/L (ref 3.5–5.1)
Sodium: 141 mEq/L (ref 135–145)

## 2017-08-30 LAB — HEMOGLOBIN A1C: Hgb A1c MFr Bld: 6.4 % (ref 4.6–6.5)

## 2017-08-31 DIAGNOSIS — Z23 Encounter for immunization: Secondary | ICD-10-CM | POA: Diagnosis not present

## 2017-08-31 NOTE — Telephone Encounter (Signed)
Done hardcopy to Shirron  

## 2017-08-31 NOTE — Telephone Encounter (Signed)
Routing to dr Kaspian, please advise in the absence of dr plotnikov, thanks 

## 2017-08-31 NOTE — Telephone Encounter (Signed)
Faxed

## 2017-09-02 ENCOUNTER — Encounter: Payer: Self-pay | Admitting: Internal Medicine

## 2017-09-05 DIAGNOSIS — M0609 Rheumatoid arthritis without rheumatoid factor, multiple sites: Secondary | ICD-10-CM | POA: Diagnosis not present

## 2017-09-07 ENCOUNTER — Encounter: Payer: Self-pay | Admitting: Internal Medicine

## 2017-09-13 ENCOUNTER — Other Ambulatory Visit: Payer: Self-pay | Admitting: Internal Medicine

## 2017-09-14 NOTE — Telephone Encounter (Signed)
Called pt no answer [facility] rx ready for pick-up.../lmb 

## 2017-09-15 ENCOUNTER — Encounter: Payer: Self-pay | Admitting: Internal Medicine

## 2017-09-22 ENCOUNTER — Encounter: Payer: Self-pay | Admitting: Internal Medicine

## 2017-09-22 ENCOUNTER — Ambulatory Visit (INDEPENDENT_AMBULATORY_CARE_PROVIDER_SITE_OTHER): Payer: Medicare Other | Admitting: Internal Medicine

## 2017-09-22 DIAGNOSIS — M069 Rheumatoid arthritis, unspecified: Secondary | ICD-10-CM | POA: Diagnosis not present

## 2017-09-22 DIAGNOSIS — I10 Essential (primary) hypertension: Secondary | ICD-10-CM | POA: Diagnosis not present

## 2017-09-22 DIAGNOSIS — I2510 Atherosclerotic heart disease of native coronary artery without angina pectoris: Secondary | ICD-10-CM

## 2017-09-22 DIAGNOSIS — M5440 Lumbago with sciatica, unspecified side: Secondary | ICD-10-CM

## 2017-09-22 DIAGNOSIS — G4700 Insomnia, unspecified: Secondary | ICD-10-CM | POA: Diagnosis not present

## 2017-09-22 DIAGNOSIS — R739 Hyperglycemia, unspecified: Secondary | ICD-10-CM | POA: Diagnosis not present

## 2017-09-22 DIAGNOSIS — I25119 Atherosclerotic heart disease of native coronary artery with unspecified angina pectoris: Secondary | ICD-10-CM | POA: Diagnosis not present

## 2017-09-22 DIAGNOSIS — F4321 Adjustment disorder with depressed mood: Secondary | ICD-10-CM

## 2017-09-22 NOTE — Assessment & Plan Note (Signed)
Lisinopril, Toprol 

## 2017-09-22 NOTE — Assessment & Plan Note (Signed)
A1c was nl

## 2017-09-22 NOTE — Assessment & Plan Note (Addendum)
  Better on [place]; w/less Metoprolol

## 2017-09-22 NOTE — Progress Notes (Signed)
Subjective:  Patient ID: [name], male    DOB: 1933-04-04  Age: 83 y.o. MRN: [id number]  CC: No chief complaint on file.   HPI [name] presents for RA, CAD, dyslipidemia. [name] is on a low carb diet  Outpatient Medications Prior to Visit  Medication Sig Dispense Refill  . Abatacept (ORENCIA [place]) Inject 1 Applicatorful into the skin See admin instructions. Every 4 weeks Next injection due 09-29-16    . apixaban (ELIQUIS) 5 MG TABS tablet Take 1 tablet (5 mg total) by mouth 2 (two) times daily. 180 tablet 3  . DULoxetine (CYMBALTA) 60 MG capsule Take 1 capsule (60 mg total) by mouth every morning. 90 capsule 3  . ezetimibe (ZETIA) 10 MG tablet TAKE ONE-HALF TABLET DAILY 45 tablet 3  . fexofenadine (ALLEGRA) 180 MG tablet Take 180 mg by mouth daily as needed for allergies or rhinitis.     [name] lisinopril (PRINIVIL,ZESTRIL) 5 MG tablet Take 1 tablet (5 mg total) by mouth daily. 90 tablet 3  . LORazepam (ATIVAN) 0.5 MG tablet TAKE ONE OR TWO TABLETS TWICE DAILY AS NEEDED FOR ANXIETY 60 tablet 2  . metoprolol tartrate (LOPRESSOR) 25 MG tablet TAKE ONE AND ONE-HALF TABLET TWICE DAILY (Patient taking differently: TAKE ONE TABLET TWICE DAILY) 270 tablet 3  . NITROSTAT 0.4 MG SL tablet ONE TABLET UNDER TONGUE EVERY 5 MINUTES FOR 3 DOSES AS NEEDED FOR CHEST PAIN 25 tablet 4  . omeprazole (PRILOSEC) 40 MG capsule TAKE ONE CAPSULE EACH DAY 90 capsule 2  . oxyCODONE-acetaminophen (PERCOCET/ROXICET) 5-325 MG tablet TAKE ONE OR TWO TABLETS EVERY EIGHT HOURS AS NEEDED FOR MODERATE PAINOR SEVERE PAIN 90 tablet 0  . predniSONE (DELTASONE) 5 MG tablet Take 5 mg every morning by mouth.   0  . Propylene Glycol (SYSTANE BALANCE OP) Place 1 drop into both eyes daily as needed (dry eyeS).     [name] rosuvastatin (CRESTOR) 10 MG tablet TAKE ONE TABLET EACH DAY 90 tablet 3  . zolpidem (AMBIEN) 10 MG tablet TAKE ONE TABLET AT BEDTIME AS NEEDED FORSLEEP 90 tablet 0   No facility-administered medications prior to  visit.     ROS Review of Systems  Constitutional: Negative for appetite change, fatigue and unexpected weight change.  HENT: Negative for congestion, nosebleeds, sneezing, sore throat and trouble swallowing.   Eyes: Negative for itching and visual disturbance.  Respiratory: Negative for cough.   Cardiovascular: Negative for chest pain, palpitations and leg swelling.  Gastrointestinal: Negative for abdominal distention, blood in stool, diarrhea and nausea.  Genitourinary: Negative for frequency and hematuria.  Musculoskeletal: Positive for arthralgias and back pain. Negative for gait problem, joint swelling and neck pain.  Skin: Negative for rash.  Neurological: Negative for dizziness, tremors, speech difficulty and weakness.  Psychiatric/Behavioral: Positive for sleep disturbance. Negative for agitation and dysphoric mood. The patient is nervous/anxious.     Objective:  BP 126/78 (BP Location: Left Arm, Patient Position: Sitting, Cuff Size: Large)   Pulse 76   Temp 97.8 F (36.6 C) (Oral)   Ht 6' (1.829 m)   Wt 178 lb (80.7 kg)   SpO2 100%   BMI 24.14 kg/m   BP Readings from Last 3 Encounters:  09/22/17 126/78  06/22/17 128/78  05/30/17 134/82    Wt Readings from Last 3 Encounters:  09/22/17 178 lb (80.7 kg)  06/22/17 179 lb (81.2 kg)  05/30/17 181 lb 12.8 oz (82.5 kg)    Physical Exam  Constitutional: He is oriented  to person, place, and time. He appears well-developed. No distress.  NAD  HENT:  Mouth/Throat: Oropharynx is clear and moist.  Eyes: Conjunctivae are normal. Pupils are equal, round, and reactive to light.  Neck: Normal range of motion. No JVD present. No thyromegaly present.  Cardiovascular: Normal rate, regular rhythm, normal heart sounds and intact distal pulses. Exam reveals no gallop and no friction rub.  No murmur heard. Pulmonary/Chest: Effort normal and breath sounds normal. No respiratory distress. He has no wheezes. He has no rales. He exhibits  no tenderness.  Abdominal: Soft. Bowel sounds are normal. He exhibits no distension and no mass. There is no tenderness. There is no rebound and no guarding.  Musculoskeletal: Normal range of motion. He exhibits tenderness. He exhibits no edema.  Lymphadenopathy:    He has no cervical adenopathy.  Neurological: He is alert and oriented to person, place, and time. He has normal reflexes. No cranial nerve deficit. He exhibits normal muscle tone. He displays a negative Romberg sign. Coordination and gait normal.  Skin: Skin is warm and dry. No rash noted.  Psychiatric: He has a normal mood and affect. His behavior is normal. Judgment and thought content normal.    Lab Results  Component Value Date   WBC 6.0 02/21/2017   HGB 14.2 02/21/2017   HCT 42.3 02/21/2017   PLT 287.0 02/21/2017   GLUCOSE 85 08/30/2017   CHOL 164 02/21/2017   TRIG 173.0 (H) 02/21/2017   HDL 52.20 02/21/2017   LDLDIRECT 168.9 05/24/2013   LDLCALC 78 02/21/2017   ALT 23 02/21/2017   AST 20 02/21/2017   NA 141 08/30/2017   K 3.6 08/30/2017   CL 104 08/30/2017   CREATININE 0.73 08/30/2017   BUN 18 08/30/2017   CO2 26 08/30/2017   TSH 1.015 10/07/2015   PSA 2.46 02/21/2017   INR 0.92 09/13/2016   HGBA1C 6.4 08/30/2017    Mr Cervical Spine Wo Contrast  Result Date: 05/02/2017 CLINICAL DATA:  Initial evaluation for neck pain with right shoulder and arm pain for 6-8 weeks. EXAM: MRI CERVICAL SPINE WITHOUT CONTRAST TECHNIQUE: Multiplanar, multisequence MR imaging of the cervical spine was performed. No intravenous contrast was administered. COMPARISON:  Comparison prior radiograph from 04/04/2017 as well as previous MRI from 03/21/2012. FINDINGS: Alignment: Straightening with reversal of the normal cervical lordosis, apex at C4-5. 3 mm anterolisthesis of C3 on C4. Trace anterolisthesis of C7 on T1. Vertebrae: Vertebral body heights maintained. No evidence for acute or chronic fracture. Chronic reactive endplate changes  present about the C4-5, C5-6, and C6-7 interspaces. The reactive marrow edema about the right C2-3 facet due to facet degeneration (series 3, image 1). No abnormal marrow edema. No discrete or worrisome osseous lesions. Cord: Signal intensity within the cervical spinal cord is normal. Posterior Fossa, vertebral arteries, paraspinal tissues: Probable chronic microvascular ischemic changes partially visualize within the pons/ brainstem. Visualized brain and posterior fossa otherwise unremarkable. Craniocervical junction normal. Question asymmetric edema about right posterior paraspinous musculature (series 6, image 28), incompletely visualized. This appears to largely encompass the right levator scapulae muscle. Paraspinous soft tissues otherwise normal. Normal intravascular flow voids present within the vertebral arteries bilaterally. Disc levels: C2-C3: Mild degenerative disc bulge, stable. Right worse than left uncovertebral hypertrophy, also unchanged. Progressive bilateral facet degeneration, worse on the right, with associated reactive edema about the right C2-3 facet. No significant canal stenosis. Progressive mild to moderate bilateral C3 foraminal stenosis, right slightly worse than left. C3-C4: Shallow posterior disc bulge. Uncovertebral  hypertrophy on the left. Bilateral facet degeneration, also worse on the left. Bulging disc flattens and indents the ventral thecal sac with resultant mild canal stenosis on the left, stable. Moderate left with mild right C4 foraminal stenosis, slightly progressed. C4-C5: Chronic diffuse degenerative disc osteophyte with intervertebral disc space narrowing and reactive endplate changes, progressed from previous. Associated intervertebral disc space height loss. Posterior disc osteophyte complex flattens the ventral thecal sac, slightly eccentric to the right. Resultant mild spinal stenosis with flattening of the cervical spinal cord without cord signal changes. Mild to  moderate right with mild left C5 foraminal stenosis, similar to previous. C5-C6: Chronic diffuse degenerative disc osteophyte with intervertebral disc space narrowing and reactive endplate changes. Broad posterior component flattens and partially faces the ventral thecal sac with resultant mild canal stenosis. Mild flattening of the cervical spinal cord without cord signal changes. Appearance is similar to previous. Mild to moderate bilateral C6 foraminal narrowing, slightly worse on the left, similar to previous. C6-C7: Chronic diffuse degenerative disc osteophyte, a centric to the right. Flattening of the ventral thecal sac, greater on the right. Mild right-sided cord flattening without cord signal changes. Right greater than left uncovertebral spurring. Mild bilateral foraminal narrowing, greater on the right. C7-T1: Trace anterolisthesis of C7 on T1. Bilateral facet degeneration. Lobulated posterior disc bulge with bilateral uncinate spurring. No significant canal stenosis. Mild bilateral foraminal narrowing, slightly worse than left, similar to prior. Visualized upper thoracic spine within normal limits. IMPRESSION: 1. 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 Resultant mild canal stenosis at C3-4 through C6-7. 2. Multifactorial degenerative changes with resultant multilevel foraminal narrowing as detailed above. Notable findings include progressive mild to moderate bilateral C3 foraminal stenosis, moderate left C4 foraminal narrowing, and mild to moderate bilateral C6 foraminal stenosis. 3. Prominent right-sided facet degeneration at C2-3 on the right with associated reactive marrow edema. Finding could serve as a source for neck pain. 4. Apparent asymmetric edema about the right posterior musculature of the upper back, primarily encompassing the partially visualized distal right levator scapulae muscle. Finding may be related to  muscular injury/strain. Electronically Signed   By: Jeannine Boga M.D.   On: 05/02/2017 21:54    Assessment & Plan:   There are no diagnoses linked to this encounter. I am having [name] maintain his Abatacept (ORENCIA [place]), Propylene Glycol (SYSTANE BALANCE OP), predniSONE, fexofenadine, NITROSTAT, apixaban, lisinopril, rosuvastatin, DULoxetine, ezetimibe, omeprazole, LORazepam, metoprolol tartrate, zolpidem, and oxyCODONE-acetaminophen.  No orders of the defined types were placed in this encounter.    Follow-up: No Follow-up on file.  [name], MD

## 2017-09-22 NOTE — Assessment & Plan Note (Signed)
Zetia, ASA, Toprol, Crestor 

## 2017-09-22 NOTE — Assessment & Plan Note (Signed)
[name]

## 2017-09-22 NOTE — Assessment & Plan Note (Signed)
Cymbalta 

## 2017-09-22 NOTE — Patient Instructions (Signed)
Nasim Taleb "Fooled by randomness"

## 2017-09-22 NOTE — Assessment & Plan Note (Signed)
Chronic  Percocet prn  Potential benefits of a long term opioids use as well as potential risks (i.e. addiction risk, apnea etc) and complications (i.e. Somnolence, constipation and others) were explained to the patient and were aknowledged.

## 2017-09-22 NOTE — Assessment & Plan Note (Signed)
Lorazepam prn  Potential benefits of a long term benzodiazepines  use as well as potential risks  and complications were explained to the patient and were aknowledged.  

## 2017-10-03 DIAGNOSIS — M0609 Rheumatoid arthritis without rheumatoid factor, multiple sites: Secondary | ICD-10-CM | POA: Diagnosis not present

## 2017-10-17 ENCOUNTER — Telehealth: Payer: Self-pay | Admitting: Internal Medicine

## 2017-10-17 NOTE — Telephone Encounter (Signed)
Pt's has been having problems when standing from a sitting position. Pt saw his PCP on 09/22/17, PCP recommended to him to take 1 tablet of metoprolol 25 mg twice a day instead of 1.5 tablets (37.5 mg twice a day). Pt states he was doing well with taking one tablet until last night when he had another episode of being dizzy when he got up from the chair. Pt asked if alcohol will decreased his BP. Pt will avoid alcohol and will stay on Metoprolol 25 mg one tablet twice a day. Pt will call back if he has any issues.

## 2017-10-17 NOTE — Telephone Encounter (Signed)
Patient calling, states that he has episodes when he sits down and when he stands up, he feels dizzy. Patient states that his PCP advised that he cut his metoprolol dosage, patient states that it helped but he still has the episodes. [name] says he feels fine now but last night he had another episode, that was pretty bad. Patient would like to know if he should decrease his metoprolol?

## 2017-10-21 DIAGNOSIS — M4712 Other spondylosis with myelopathy, cervical region: Secondary | ICD-10-CM | POA: Diagnosis not present

## 2017-10-31 DIAGNOSIS — M0609 Rheumatoid arthritis without rheumatoid factor, multiple sites: Secondary | ICD-10-CM | POA: Diagnosis not present

## 2017-11-18 DIAGNOSIS — L812 Freckles: Secondary | ICD-10-CM | POA: Diagnosis not present

## 2017-11-18 DIAGNOSIS — Z85828 Personal history of other malignant neoplasm of skin: Secondary | ICD-10-CM | POA: Diagnosis not present

## 2017-11-18 DIAGNOSIS — L570 Actinic keratosis: Secondary | ICD-10-CM | POA: Diagnosis not present

## 2017-11-18 DIAGNOSIS — D0439 Carcinoma in situ of skin of other parts of face: Secondary | ICD-10-CM | POA: Diagnosis not present

## 2017-11-18 DIAGNOSIS — L821 Other seborrheic keratosis: Secondary | ICD-10-CM | POA: Diagnosis not present

## 2017-11-18 NOTE — Progress Notes (Addendum)
Subjective:   [name] is a 84 y.o. male who presents for Medicare Annual/Subsequent preventive examination.  Review of Systems:  No ROS.  Medicare Wellness Visit. Additional risk factors are reflected in the social history.  Cardiac Risk Factors include: dyslipidemia;hypertension;male gender Sleep patterns: gets up 1-2 times nightly to void and sleeps 6-7 hours nightly.  Wears C-PAP  Home Safety/Smoke Alarms: Feels safe in home. Smoke alarms in place.  Living environment; residence and Firearm Safety: 2-story house, no firearms.Lives with wife, no needs for DME, good support system Seat Belt Safety/Bike Helmet: Wears seat belt.     Objective:    Vitals: BP 136/72   Pulse 64   Resp 20   Ht 6' (1.829 m)   Wt 177 lb (80.3 kg)   SpO2 98%   BMI 24.01 kg/m   Body mass index is 24.01 kg/m.  Advanced Directives 11/22/2017 11/18/2016 09/17/2016 09/15/2016 09/13/2016 08/11/2016 07/12/2016  Does Patient Have a Medical Advance Directive? Yes Yes Yes Yes Yes Yes Yes  Type of Paramedic of [place];Living will [place];Living will Healthcare Power of [place];Living will [place];Living will [place];Living will [place];Living will  Does patient want to make changes to medical advance directive? - - No - Patient declined No - Patient declined - - No - Patient declined  Copy of [place] in Chart? No - copy requested No - copy requested No - copy requested No - copy requested No - copy requested - No - copy requested  Pre-existing out of facility DNR order (yellow form or pink MOST form) - - - - - - -    Tobacco Social History   Tobacco Use  Smoking Status Former Smoker  . Years: 10.00  . Types: Cigarettes  . Last attempt to quit: 08/09/1968  . Years since quitting: 49.3  Smokeless Tobacco Never Used     Counseling given: Not  Answered  Past Medical History:  Diagnosis Date  . CAD (coronary artery disease) 12/2013--  cardiologist-  dr allred/ dr turner   s/p inferior STEMI 01/08/2014; [place] 01/08/14: total RCA occlusion s/p 3.5x34mm Xience DES distal RCA and 3.5x28 mm DES mid RCA, 60-70% mid LAD stenosis, EF 55%  . Complication of anesthesia    'long to wake up after back surgery " 09/2003  . Diverticulosis of colon   . GERD (gastroesophageal reflux disease)   . History of cellulitis    05-26-2015  LLE  . History of colon polyps    1998- benign/  2008 adenomatous   . History of kidney stones    2013  . History of squamous cell carcinoma excision    2013;  2015;  06-12-2015 right leg/  02/ and 05/ 2017  left ear and left leg  . History of ST elevation myocardial infarction (STEMI) 01/08/2014   acute infertor--  s/p  DES x2 to RCA  . Hydronephrosis, left   . Hyperlipidemia   . Hypertension   . Migraine with aura   . OSA on CPAP 06/19/2015   Moderate OSA with AHI 18/hr  per study 05-20-2015  . Osteoarthritis   . Palpitations   . Paroxysmal atrial fibrillation ([place]) 4/16   chads2vasc score is at least 4  . Premature atrial contractions   . RA (rheumatoid arthritis) [facility])    rheumatologist-  dr Leigh Aurora  . S/P drug eluting coronary stent placement 01/08/2014  x2  to mid and distal RCA  . Sinusitis, chronic 10/17/2015  . Wears glasses   . Wears hearing aid    bilateral   Past Surgical History:  Procedure Laterality Date  . BACK SURGERY    . CARDIOVASCULAR STRESS TEST  11/21/2015   Low risk nuclear study w/ a small diaphragmatic attenuation artifact, no ischemia/  normal LV function and wall motion , ef 63%  . CATARACT EXTRACTION W/ INTRAOCULAR LENS  IMPLANT, BILATERAL  2015  . COLONOSCOPY  last one 06-09-2011  . CORONARY ANGIOPLASTY    . CYSTOSCOPY W/ RETROGRADES Left 07/12/2016   Procedure: CYSTOSCOPY WITH RETROGRADE PYELOGRAM LEFT URETERAL STENT;  Surgeon: Irine Seal, MD;  Location: [facility];   Service: Urology;  Laterality: Left;  . CYSTOSCOPY WITH RETROGRADE PYELOGRAM, URETEROSCOPY AND STENT PLACEMENT Left 08/11/2016   Procedure: CYSTOSCOPY WITH RETROGRADE PYELOGRAM,  DIAGNOSTIC URETEROSCOPY , STENT EXCHANGE;  Surgeon: Alexis Frock, MD;  Location: [facility];  Service: Urology;  Laterality: Left;  . 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   severe fibromatosis palm and fingers  . LEFT HEART CATHETERIZATION WITH CORONARY ANGIOGRAM N/A 01/08/2014   Procedure: LEFT HEART CATHETERIZATION WITH CORONARY ANGIOGRAM;  Surgeon: Troy Sine, MD;  Location: [facility] CATH LAB;  Service: Cardiovascular;  Laterality:N/A;  total/ subtotal RCA/  mLAD 81-01% w/ mid systolic bridging/  preserved global LVF, ef 55%  . MOHS SURGERY  x2  feb and may 2017   left ear /  left leg  ([facility])  . PERCUTANEOUS CORONARY STENT INTERVENTION (PCI-S)  01/08/2014   Procedure: PERCUTANEOUS CORONARY STENT INTERVENTION (PCI-S);  Surgeon: Troy Sine, MD;  Location[facility] C[facility] LAB;  Service: Cardiovascular;;  DES to mid and distal RCA  . POSTERIOR LUMBAR FUSION  10/01/2003   and Laminectomy/ diskectomy  L4 -- S1  . ROBOT ASSISTED PYELOPLASTY Left 09/15/2016   Procedure: XI ROBOTIC ASSISTED PYELOPLASTY;  Surgeon: Alexis Frock, MD;  [facility];  Service: Urology;  Laterality: Left;  . TONSILLECTOMY    . TRANSTHORACIC ECHOCARDIOGRAM  02/23/2016   mild LVH,  ef 50-55%,  grade 1 diastolic dysfunction/  mild to moderate AV calcification w/ no stenosis or regurg./  trivial MR and TR/  mild PR   Family History  Problem Relation Age of Onset  . Hypertension Mother   . Heart disease Father   . Colon cancer Neg Hx    Social History   Socioeconomic History  . Marital status: Married    Spouse name: None  . Number of children: 2  . Years of education: None  . Highest education level: None  Social Needs  . Financial resource strain: Not hard at all  . Food insecurity - worry: Never true  . Food  insecurity - inability: Never true  . Transportation needs - medical: No  . Transportation needs - non-medical: No  Occupational History  . Occupation: Retired    Fish farm manager: RETIRED  Tobacco Use  . Smoking status: Former Smoker    Years: 10.00    Types: Cigarettes    Last attempt to quit: 08/09/1968    Years since quitting: 49.3  . Smokeless tobacco: Never Used  Substance and Sexual Activity  . Alcohol use: Yes    Alcohol/week: 4.2 - 8.4 oz    Types: 7 - 14 Glasses of Veola Cafaro per week    Comment: 1-2 glasses of Cadee Agro nightly  . Drug use: No  . Sexual activity: Yes  Other Topics Concern  . None  Social History Narrative   1 Caffeine drink daily     Outpatient Encounter Medications as of 11/22/2017  Medication Sig  . Abatacept (ORENCIA [place]) Inject 1 Applicatorful into the skin See admin instructions. Every 4 weeks Next injection due 09-29-16  . apixaban (ELIQUIS) 5 MG TABS tablet Take 1 tablet (5 mg total) by mouth 2 (two) times daily.  . DULoxetine (CYMBALTA) 60 MG capsule Take 1 capsule (60 mg total) by mouth every morning.  . ezetimibe (ZETIA) 10 MG tablet TAKE ONE-HALF TABLET DAILY  . fexofenadine (ALLEGRA) 180 MG tablet Take 180 mg by mouth daily as needed for allergies or rhinitis.   [name] lisinopril (PRINIVIL,ZESTRIL) 5 MG tablet Take 1 tablet (5 mg total) by mouth daily.  [name] LORazepam (ATIVAN) 0.5 MG tablet TAKE ONE OR TWO TABLETS TWICE DAILY AS NEEDED FOR ANXIETY  . metoprolol tartrate (LOPRESSOR) 25 MG tablet TAKE ONE AND ONE-HALF TABLET TWICE DAILY (Patient taking differently: TAKE ONE TABLET TWICE DAILY)  . NITROSTAT 0.4 MG SL tablet ONE TABLET UNDER TONGUE EVERY 5 MINUTES FOR 3 DOSES AS NEEDED FOR CHEST PAIN  . omeprazole (PRILOSEC) 40 MG capsule TAKE ONE CAPSULE EACH DAY  . oxyCODONE-acetaminophen (PERCOCET/ROXICET) 5-325 MG tablet TAKE ONE OR TWO TABLETS EVERY EIGHT HOURS AS NEEDED FOR MODERATE PAINOR SEVERE PAIN  . predniSONE (DELTASONE) 5 MG tablet Take 5 mg every morning by  mouth.   . Propylene Glycol (SYSTANE BALANCE OP) Place 1 drop into both eyes daily as needed (dry eyeS).   [name] rosuvastatin (CRESTOR) 10 MG tablet TAKE ONE TABLET EACH DAY  . zolpidem (AMBIEN) 10 MG tablet TAKE ONE TABLET AT BEDTIME AS NEEDED FORSLEEP   No facility-administered encounter medications on file as of 11/22/2017.     Activities of Daily Living In your present state of health, do you have any difficulty performing the following activities: 11/22/2017  Hearing? N  Vision? N  Difficulty concentrating or making decisions? N  Walking or climbing stairs? N  Dressing or bathing? N  Doing errands, shopping? N  Preparing Food and eating ? N  Using the Toilet? N  In the past six months, have you accidently leaked urine? N  Do you have problems with loss of bowel control? N  Managing your Medications? N  Managing your Finances? N  Housekeeping or managing your Housekeeping? N  Some recent data might be hidden    Patient Care Team: Plotnikov, Evie Lacks, MD as PCP - General Henrene Pastor Docia Chuck, MD (Gastroenterology) Thompson Grayer, MD (Cardiology) Hennie Duos, MD as Consulting Physician (Rheumatology) Alexis Frock, MD as Consulting Physician (Urology) Katy Apo, MD as Consulting Physician (Ophthalmology) Mathis Fare (Dentistry) Tiajuana Amass, MD as Referring Physician (Allergy and Immunology)   Assessment:   This is a routine wellness examination for [place]. Physical assessment deferred to PCP.   Exercise Activities and Dietary recommendations Current Exercise Habits: Structured exercise class, Type of exercise: walking;strength training/weights;calisthenics, Time (Minutes): 50, Frequency (Times/Week): 4, Weekly Exercise (Minutes/Week): 200, Intensity: Mild, Exercise limited by: None identified  Diet (meal preparation, eat out, water intake, caffeinated beverages, dairy products, fruits and vegetables): in general, a "healthy" diet  , well balanced. eats a variety of fruits and  vegetables daily, limits salt, fat/cholesterol, sugar, caffeine, drinks 6-8 glasses of water daily.    Goals    . Patient Stated     Stay as healthy and as independent as possible. Travel, enjoy family and life.       Fall Risk Fall Risk  11/22/2017 11/18/2016 12/26/2014  Falls in the past year? No No Yes  Number falls in past yr: - - 1  Comment - - gets off balance  Injury with Fall? - - No    Depression Screen PHQ 2/9 Scores 11/22/2017 11/18/2016 12/26/2014 04/19/2014  PHQ - 2 Score 1 0 0 0  PHQ- 9 Score 3 - - -  Exception Documentation - - Patient refusal -    Cognitive Function MMSE - Mini Mental State Exam 11/22/2017  Orientation to time 5  Orientation to Place 5  Registration 3  Attention/ Calculation 5  Recall 1  Language- name 2 objects 2  Language- repeat 1  Language- follow 3 step command 3  Language- read & follow direction 1  Write a sentence 1  Copy design 1  Total score 28        Immunization History  Administered Date(s) Administered  . Influenza Split 08/10/2011, 08/15/2012  . Influenza Whole 08/21/2009, 09/22/2010  . Influenza, High Dose Seasonal PF 09/16/2015, 08/18/2016  . Influenza,inj,Quad PF,6+ Mos 08/06/2013  . Influenza-Unspecified 08/31/2017  . Pneumococcal Conjugate-13 10/02/2013  . Pneumococcal Polysaccharide-23 06/04/2010  . Tdap 10/22/2011  . Zoster Recombinat (Shingrix) 03/09/2017, 05/10/2017    Screening Tests Health Maintenance  Topic Date Due  . TETANUS/TDAP  10/21/2021  . INFLUENZA VACCINE  Completed  . PNA vac Low Risk Adult  Completed      Plan:    Continue doing brain stimulating activities (puzzles, reading, adult coloring books, staying active) to keep memory sharp.   Continue to eat heart healthy diet (full of fruits, vegetables, whole grains, lean protein, water--limit salt, fat, and sugar intake) and increase physical activity as tolerated.  I have personally reviewed and noted the following in the patient's chart:    . Medical and social history . Use of alcohol, tobacco or illicit drugs  . Current medications and supplements . Functional ability and status . Nutritional status . Physical activity . Advanced directives . List of other physicians . Vitals . Screenings to include cognitive, depression, and falls . Referrals and appointments  In addition, I have reviewed and discussed with patient certain preventive protocols, quality metrics, and best practice recommendations. A written personalized care plan for preventive services as well as general preventive health recommendations were provided to patient.     Michiel Cowboy, RN  11/22/2017  Medical screening examination/treatment/procedure(s) were performed by non-physician practitioner and as supervising physician I was immediately available for consultation/collaboration. I agree with above. Lew Dawes, MD

## 2017-11-22 ENCOUNTER — Ambulatory Visit (INDEPENDENT_AMBULATORY_CARE_PROVIDER_SITE_OTHER): Payer: Medicare Other | Admitting: *Deleted

## 2017-11-22 VITALS — BP 136/72 | HR 64 | Resp 20 | Ht 72.0 in | Wt 177.0 lb

## 2017-11-22 DIAGNOSIS — Z0000 Encounter for general adult medical examination without abnormal findings: Secondary | ICD-10-CM

## 2017-11-22 NOTE — Patient Instructions (Signed)
Continue doing brain stimulating activities (puzzles, reading, adult coloring books, staying active) to keep memory sharp.   Continue to eat heart healthy diet (full of fruits, vegetables, whole grains, lean protein, water--limit salt, fat, and sugar intake) and increase physical activity as tolerated.   [name] , Thank you for taking time to come for your Medicare Wellness Visit. I appreciate your ongoing commitment to your health goals. Please review the following plan we discussed and let me know if I can assist you in the future.   These are the goals we discussed: Goals    . Patient Stated     Stay as healthy and as independent as possible. Travel, enjoy family and life.       This is a list of the screening recommended for you and due dates:  Health Maintenance  Topic Date Due  . Tetanus Vaccine  10/21/2021  . Flu Shot  Completed  . Pneumonia vaccines  Completed

## 2017-11-28 DIAGNOSIS — M0609 Rheumatoid arthritis without rheumatoid factor, multiple sites: Secondary | ICD-10-CM | POA: Diagnosis not present

## 2017-12-08 ENCOUNTER — Other Ambulatory Visit: Payer: Self-pay | Admitting: Internal Medicine

## 2017-12-08 ENCOUNTER — Other Ambulatory Visit: Payer: Self-pay | Admitting: Cardiology

## 2017-12-12 ENCOUNTER — Ambulatory Visit (INDEPENDENT_AMBULATORY_CARE_PROVIDER_SITE_OTHER): Payer: Medicare Other | Admitting: Internal Medicine

## 2017-12-12 ENCOUNTER — Encounter: Payer: Self-pay | Admitting: *Deleted

## 2017-12-12 ENCOUNTER — Encounter: Payer: Self-pay | Admitting: Internal Medicine

## 2017-12-12 VITALS — BP 126/74 | HR 101 | Ht 72.0 in | Wt 176.0 lb

## 2017-12-12 DIAGNOSIS — I480 Paroxysmal atrial fibrillation: Secondary | ICD-10-CM

## 2017-12-12 DIAGNOSIS — I25119 Atherosclerotic heart disease of native coronary artery with unspecified angina pectoris: Secondary | ICD-10-CM | POA: Diagnosis not present

## 2017-12-12 DIAGNOSIS — M79642 Pain in left hand: Secondary | ICD-10-CM | POA: Diagnosis not present

## 2017-12-12 DIAGNOSIS — M5030 Other cervical disc degeneration, unspecified cervical region: Secondary | ICD-10-CM | POA: Diagnosis not present

## 2017-12-12 DIAGNOSIS — I209 Angina pectoris, unspecified: Secondary | ICD-10-CM

## 2017-12-12 DIAGNOSIS — M0609 Rheumatoid arthritis without rheumatoid factor, multiple sites: Secondary | ICD-10-CM | POA: Diagnosis not present

## 2017-12-12 DIAGNOSIS — Z6824 Body mass index (BMI) 24.0-24.9, adult: Secondary | ICD-10-CM | POA: Diagnosis not present

## 2017-12-12 DIAGNOSIS — G4733 Obstructive sleep apnea (adult) (pediatric): Secondary | ICD-10-CM | POA: Diagnosis not present

## 2017-12-12 NOTE — Patient Instructions (Signed)
Medication Instructions:  Your physician recommends that you continue on your current medications as directed. Please refer to the Current Medication list given to you today.   Labwork: None ordered   Testing/Procedures: None ordered   Follow-Up: Your physician wants you to follow-up in: 12 months with Dr Rayann Heman  Please call our office to schedule the follow-up appointment.   Any Other Special Instructions Will Be Listed Below (If Applicable).     If you need a refill on your cardiac medications before your next appointment, please call your pharmacy.

## 2017-12-12 NOTE — Progress Notes (Signed)
PCP: Plotnikov, Evie Lacks, MD Cardiology:  Dr Radford Pax follows for OSA Primary EP: Dr Farrel Gordon is a 84 y.o. male who presents today for routine electrophysiology followup.  Since last being seen in our clinic, the patient reports doing very well.  + nightsweats of unclear cause.  He sees Dr [name] tomorrow.  Today, he denies symptoms of palpitations, chest pain, shortness of breath,  lower extremity edema, dizziness, presyncope, or syncope.  The patient is otherwise without complaint today.   Past Medical History:  Diagnosis Date  . CAD (coronary artery disease) 12/2013--  cardiologist-  dr Semir Brill/ dr turner   s/p inferior STEMI 01/08/2014; [place] 01/08/14: total RCA occlusion s/p 3.5x80mm Xience DES distal RCA and 3.5x28 mm DES mid RCA, 60-70% mid LAD stenosis, EF 55%  . Complication of anesthesia    'long to wake up after back surgery " 09/2003  . Diverticulosis of colon   . GERD (gastroesophageal reflux disease)   . History of cellulitis    05-26-2015  LLE  . History of colon polyps    1998- benign/  2008 adenomatous   . History of kidney stones    2013  . History of squamous cell carcinoma excision    2013;  2015;  06-12-2015 right leg/  02/ and 05/ 2017  left ear and left leg  . History of ST elevation myocardial infarction (STEMI) 01/08/2014   acute infertor--  s/p  DES x2 to RCA  . Hydronephrosis, left   . Hyperlipidemia   . Hypertension   . Migraine with aura   . OSA on CPAP 06/19/2015   Moderate OSA with AHI 18/hr  per study 05-20-2015  . Osteoarthritis   . Palpitations   . Paroxysmal atrial fibrillation ([place]) 4/16   chads2vasc score is at least 4  . Premature atrial contractions   . RA (rheumatoid arthritis) [facility])    rheumatologist-  dr Leigh Aurora  . S/P drug eluting coronary stent placement 01/08/2014   x2  to mid and distal RCA  . Sinusitis, chronic 10/17/2015  . Wears glasses   . Wears hearing aid    bilateral   Past Surgical History:  Procedure  Laterality Date  . BACK SURGERY    . CARDIOVASCULAR STRESS TEST  11/21/2015   Low risk nuclear study w/ a small diaphragmatic attenuation artifact, no ischemia/  normal LV function and wall motion , ef 63%  . CATARACT EXTRACTION W/ INTRAOCULAR LENS  IMPLANT, BILATERAL  2015  . COLONOSCOPY  last one 06-09-2011  . CORONARY ANGIOPLASTY    . CYSTOSCOPY W/ RETROGRADES Left 07/12/2016   Procedure: CYSTOSCOPY WITH RETROGRADE PYELOGRAM LEFT URETERAL STENT;  Surgeon: Irine Seal, MD;  [facility];  Service: Urology;  Laterality: Left;  . CYSTOSCOPY WITH RETROGRADE PYELOGRAM, URETEROSCOPY AND STENT PLACEMENT Left 08/11/2016   Procedure: CYSTOSCOPY WITH RETROGRADE PYELOGRAM,  DIAGNOSTIC URETEROSCOPY , STENT EXCHANGE;  Surgeon: Alexis Frock, MD;  [facility];  Service: Urology;  Laterality: Left;  . 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   severe fibromatosis palm and fingers  . LEFT HEART CATHETERIZATION WITH CORONARY ANGIOGRAM N/A 01/08/2014   Procedure: LEFT HEART CATHETERIZATION WITH CORONARY ANGIOGRAM;  Surgeon: Troy Sine, MD;  Location[facility] CATH LAB;  Service: Cardiovascular;  Laterality:N/A;  total/ subtotal RCA/  mLAD 37-62% w/ mid systolic bridging/  preserved global LVF, ef 55%  . MOHS SURGERY  x2  feb and may 2017   left ear /  left  leg  ([facility])  . PERCUTANEOUS CORONARY STENT INTERVENTION (PCI-S)  01/08/2014   Procedure: PERCUTANEOUS CORONARY STENT INTERVENTION (PCI-S);  Surgeon: Troy Sine, MD;  Location: [facility] CATH LAB;  Service: Cardiovascular;;  DES to mid and distal RCA  . POSTERIOR LUMBAR FUSION  10/01/2003   and Laminectomy/ diskectomy  L4 -- S1  . ROBOT ASSISTED PYELOPLASTY Left 09/15/2016   Procedure: XI ROBOTIC ASSISTED PYELOPLASTY;  Surgeon: Alexis Frock, MD;  [facility];  Service: Urology;  Laterality: Left;  . TONSILLECTOMY    . TRANSTHORACIC ECHOCARDIOGRAM  02/23/2016   mild LVH,  ef 50-55%,  grade 1 diastolic dysfunction/  mild to  moderate AV calcification w/ no stenosis or regurg./  trivial MR and TR/  mild PR    ROS- all systems are reviewed and negatives except as per HPI above  Current Outpatient Medications  Medication Sig Dispense Refill  . Abatacept (ORENCIA [place]) Inject 1 Applicatorful into the skin See admin instructions. Every 4 weeks Next injection due 09-29-16    . DULoxetine (CYMBALTA) 60 MG capsule TAKE ONE CAPSULE EVERY MORNING 90 capsule 3  . ELIQUIS 5 MG TABS tablet TAKE ONE TABLET TWICE DAILY 180 tablet 1  . ezetimibe (ZETIA) 10 MG tablet TAKE ONE-HALF TABLET DAILY 45 tablet 3  . fexofenadine (ALLEGRA) 180 MG tablet Take 180 mg by mouth daily as needed for allergies or rhinitis.     [name] lisinopril (PRINIVIL,ZESTRIL) 5 MG tablet TAKE ONE TABLET BY MOUTH ONCE DAILY 90 tablet 1  . LORazepam (ATIVAN) 0.5 MG tablet TAKE ONE OR TWO TABLETS TWICE DAILY AS NEEDED FOR ANXIETY 60 tablet 2  . metoprolol tartrate (LOPRESSOR) 25 MG tablet TAKE ONE AND ONE-HALF TABLET TWICE DAILY (Patient taking differently: TAKE ONE TABLET TWICE DAILY) 270 tablet 3  . NITROSTAT 0.4 MG SL tablet ONE TABLET UNDER TONGUE EVERY 5 MINUTES FOR 3 DOSES AS NEEDED FOR CHEST PAIN 25 tablet 4  . omeprazole (PRILOSEC) 40 MG capsule TAKE ONE CAPSULE EACH DAY 90 capsule 2  . oxyCODONE-acetaminophen (PERCOCET/ROXICET) 5-325 MG tablet TAKE ONE OR TWO TABLETS EVERY EIGHT HOURS AS NEEDED FOR MODERATE PAINOR SEVERE PAIN 90 tablet 0  . predniSONE (DELTASONE) 5 MG tablet Take 5 mg every morning by mouth.   0  . Propylene Glycol (SYSTANE BALANCE OP) Place 1 drop into both eyes daily as needed (dry eyeS).     [name] rosuvastatin (CRESTOR) 10 MG tablet TAKE ONE TABLET EACH DAY 90 tablet 3  . zolpidem (AMBIEN) 10 MG tablet TAKE ONE TABLET AT BEDTIME AS NEEDED FORSLEEP 90 tablet 0   No current facility-administered medications for this visit.     Physical Exam: Vitals:   12/12/17 1539  BP: 126/74  Pulse: (!) 101  Weight: 176 lb (79.8 kg)  Height: 6' (1.829  m)    GEN- The patient is well appearing, alert and oriented x 3 today.   Head- normocephalic, atraumatic Eyes-  Sclera clear, conjunctiva pink Ears- hearing intact Oropharynx- clear Lungs- Clear to ausculation bilaterally, normal work of breathing Heart- Regular rate and rhythm, no murmurs, rubs or gallops, PMI not laterally displaced GI- soft, NT, ND, + BS Extremities- no clubbing, cyanosis, or edema  EKG tracing ordered today is personally reviewed and shows sinus tachycardia, 101 bpm incomplete RBBB,  Assessment and Plan:  1. afib Well controlled On eliquis chads2vasc score is 4. Avoid AAD therapy at this time  2. OSA Stable No change required today  3. HTN Stable No change required today  4. CAD No ischemic symptoms No changes  5. OSA Compliant with CPAP  6. HL Stable No change required today  7. nightsweats To follow with PCP tomorrow Hes a little tachycardic today Regular hydration is encouraged May benefit from labs, including bmet, cbc and tfts by PCP tomorrow.  I did not order labs today in anticipation that Dr [name] would performing labs tomorrow  Return in a year  Thompson Grayer MD, [facility] 12/12/2017 4:07 PM

## 2017-12-13 ENCOUNTER — Other Ambulatory Visit (INDEPENDENT_AMBULATORY_CARE_PROVIDER_SITE_OTHER): Payer: Medicare Other

## 2017-12-13 ENCOUNTER — Encounter: Payer: Self-pay | Admitting: Internal Medicine

## 2017-12-13 ENCOUNTER — Ambulatory Visit (INDEPENDENT_AMBULATORY_CARE_PROVIDER_SITE_OTHER): Payer: Medicare Other | Admitting: Internal Medicine

## 2017-12-13 DIAGNOSIS — G4700 Insomnia, unspecified: Secondary | ICD-10-CM | POA: Diagnosis not present

## 2017-12-13 DIAGNOSIS — I25119 Atherosclerotic heart disease of native coronary artery with unspecified angina pectoris: Secondary | ICD-10-CM

## 2017-12-13 DIAGNOSIS — I2510 Atherosclerotic heart disease of native coronary artery without angina pectoris: Secondary | ICD-10-CM | POA: Diagnosis not present

## 2017-12-13 DIAGNOSIS — M069 Rheumatoid arthritis, unspecified: Secondary | ICD-10-CM | POA: Diagnosis not present

## 2017-12-13 DIAGNOSIS — I480 Paroxysmal atrial fibrillation: Secondary | ICD-10-CM

## 2017-12-13 LAB — CBC WITH DIFFERENTIAL/PLATELET
Basophils Absolute: 0 10*3/uL (ref 0.0–0.1)
Basophils Relative: 1.1 % (ref 0.0–3.0)
Eosinophils Absolute: 0.1 10*3/uL (ref 0.0–0.7)
Eosinophils Relative: 2.1 % (ref 0.0–5.0)
HCT: 42.3 % (ref 39.0–52.0)
Hemoglobin: 14.3 g/dL (ref 13.0–17.0)
Lymphocytes Relative: 39.9 % (ref 12.0–46.0)
Lymphs Abs: 1.8 10*3/uL (ref 0.7–4.0)
MCHC: 33.9 g/dL (ref 30.0–36.0)
MCV: 99.5 fl (ref 78.0–100.0)
Monocytes Absolute: 0.4 10*3/uL (ref 0.1–1.0)
Monocytes Relative: 8.4 % (ref 3.0–12.0)
Neutro Abs: 2.2 10*3/uL (ref 1.4–7.7)
Neutrophils Relative %: 48.5 % (ref 43.0–77.0)
Platelets: 278 10*3/uL (ref 150.0–400.0)
RBC: 4.25 Mil/uL (ref 4.22–5.81)
RDW: 14.2 % (ref 11.5–15.5)
WBC: 4.5 10*3/uL (ref 4.0–10.5)

## 2017-12-13 LAB — BASIC METABOLIC PANEL
BUN: 17 mg/dL (ref 6–23)
CO2: 25 mEq/L (ref 19–32)
Calcium: 9.8 mg/dL (ref 8.4–10.5)
Chloride: 105 mEq/L (ref 96–112)
Creatinine, Ser: 0.84 mg/dL (ref 0.40–1.50)
GFR: 92.91 mL/min (ref >60.00)
Glucose, Bld: 107 mg/dL — ABNORMAL HIGH (ref 70–99)
Potassium: 4.2 mEq/L (ref 3.5–5.1)
Sodium: 143 mEq/L (ref 135–145)

## 2017-12-13 LAB — TSH: TSH: 1.18 u[IU]/mL (ref 0.35–4.50)

## 2017-12-13 MED ORDER — ZOLPIDEM TARTRATE 10 MG PO TABS: ORAL_TABLET | ORAL | 0 refills | Status: DC

## 2017-12-13 NOTE — Assessment & Plan Note (Signed)
Zolpidem prn Not to take w/Lorazepam prn  Potential benefits of a long term benzodiazepines  use as well as potential risks  and complications were explained to the patient and were aknowledged.

## 2017-12-13 NOTE — Progress Notes (Signed)
Subjective:  Patient ID: [name], male    DOB: [birth year]  Age: 84 y.o. MRN: [id number]  CC: No chief complaint on file.   HPI [name] presents for RA, chronic pain, RA f/u Going to [place] x 2 months  Outpatient Medications Prior to Visit  Medication Sig Dispense Refill  . Abatacept (ORENCIA [place]) Inject 1 Applicatorful into the skin See admin instructions. Every 4 weeks Next injection due 09-29-16    . DULoxetine (CYMBALTA) 60 MG capsule TAKE ONE CAPSULE EVERY MORNING 90 capsule 3  . ELIQUIS 5 MG TABS tablet TAKE ONE TABLET TWICE DAILY 180 tablet 1  . ezetimibe (ZETIA) 10 MG tablet TAKE ONE-HALF TABLET DAILY 45 tablet 3  . fexofenadine (ALLEGRA) 180 MG tablet Take 180 mg by mouth daily as needed for allergies or rhinitis.     [name] lisinopril (PRINIVIL,ZESTRIL) 5 MG tablet TAKE ONE TABLET BY MOUTH ONCE DAILY 90 tablet 1  . LORazepam (ATIVAN) 0.5 MG tablet TAKE ONE OR TWO TABLETS TWICE DAILY AS NEEDED FOR ANXIETY 60 tablet 2  . metoprolol tartrate (LOPRESSOR) 25 MG tablet TAKE ONE AND ONE-HALF TABLET TWICE DAILY (Patient taking differently: TAKE ONE TABLET TWICE DAILY) 270 tablet 3  . NITROSTAT 0.4 MG SL tablet ONE TABLET UNDER TONGUE EVERY 5 MINUTES FOR 3 DOSES AS NEEDED FOR CHEST PAIN 25 tablet 4  . omeprazole (PRILOSEC) 40 MG capsule TAKE ONE CAPSULE EACH DAY 90 capsule 2  . oxyCODONE-acetaminophen (PERCOCET/ROXICET) 5-325 MG tablet TAKE ONE OR TWO TABLETS EVERY EIGHT HOURS AS NEEDED FOR MODERATE PAINOR SEVERE PAIN 90 tablet 0  . predniSONE (DELTASONE) 5 MG tablet Take 5 mg every morning by mouth.   0  . Propylene Glycol (SYSTANE BALANCE OP) Place 1 drop into both eyes daily as needed (dry eyeS).     [name] rosuvastatin (CRESTOR) 10 MG tablet TAKE ONE TABLET EACH DAY 90 tablet 3  . zolpidem (AMBIEN) 10 MG tablet TAKE ONE TABLET AT BEDTIME AS NEEDED FORSLEEP 90 tablet 0   No facility-administered medications prior to visit.     ROS Review of Systems  Constitutional: Negative for  appetite change, fatigue and unexpected weight change.  HENT: Negative for congestion, nosebleeds, sneezing, sore throat and trouble swallowing.   Eyes: Negative for itching and visual disturbance.  Respiratory: Negative for cough.   Cardiovascular: Negative for chest pain, palpitations and leg swelling.  Gastrointestinal: Negative for abdominal distention, blood in stool, diarrhea and nausea.  Genitourinary: Negative for frequency and hematuria.  Musculoskeletal: Positive for arthralgias. Negative for back pain, gait problem, joint swelling and neck pain.  Skin: Negative for rash.  Neurological: Negative for dizziness, tremors, speech difficulty and weakness.  Psychiatric/Behavioral: Positive for sleep disturbance. Negative for agitation, dysphoric mood and suicidal ideas. The patient is not nervous/anxious.     Objective:  BP (!) 142/76 (BP Location: Left Arm, Patient Position: Sitting, Cuff Size: Normal)   Pulse (!) 103   Temp 98.1 F (36.7 C) (Oral)   Ht 6' (1.829 m)   Wt 178 lb (80.7 kg)   SpO2 98%   BMI 24.14 kg/m   BP Readings from Last 3 Encounters:  12/13/17 (!) 142/76  12/12/17 126/74  11/22/17 136/72    Wt Readings from Last 3 Encounters:  12/13/17 178 lb (80.7 kg)  12/12/17 176 lb (79.8 kg)  11/22/17 177 lb (80.3 kg)    Physical Exam  Constitutional: He is oriented to person, place, and time. He appears well-developed. No distress.  NAD  HENT:  Mouth/Throat: Oropharynx is clear and moist.  Eyes: Conjunctivae are normal. Pupils are equal, round, and reactive to light.  Neck: Normal range of motion. No JVD present. No thyromegaly present.  Cardiovascular: Normal rate, regular rhythm, normal heart sounds and intact distal pulses. Exam reveals no gallop and no friction rub.  No murmur heard. Pulmonary/Chest: Effort normal and breath sounds normal. No respiratory distress. He has no wheezes. He has no rales. He exhibits no tenderness.  Abdominal: Soft. Bowel  sounds are normal. He exhibits no distension and no mass. There is no tenderness. There is no rebound and no guarding.  Musculoskeletal: Normal range of motion. He exhibits tenderness. He exhibits no edema.  Lymphadenopathy:    He has no cervical adenopathy.  Neurological: He is alert and oriented to person, place, and time. He has normal reflexes. No cranial nerve deficit. He exhibits normal muscle tone. He displays a negative Romberg sign. Coordination and gait normal.  Skin: Skin is warm and dry. No rash noted.  Psychiatric: He has a normal mood and affect. His behavior is normal. Judgment and thought content normal.  LS tender  Lab Results  Component Value Date   WBC 6.0 02/21/2017   HGB 14.2 02/21/2017   HCT 42.3 02/21/2017   PLT 287.0 02/21/2017   GLUCOSE 85 08/30/2017   CHOL 164 02/21/2017   TRIG 173.0 (H) 02/21/2017   HDL 52.20 02/21/2017   LDLDIRECT 168.9 05/24/2013   LDLCALC 78 02/21/2017   ALT 23 02/21/2017   AST 20 02/21/2017   NA 141 08/30/2017   K 3.6 08/30/2017   CL 104 08/30/2017   CREATININE 0.73 08/30/2017   BUN 18 08/30/2017   CO2 26 08/30/2017   TSH 1.015 10/07/2015   PSA 2.46 02/21/2017   INR 0.92 09/13/2016   HGBA1C 6.4 08/30/2017    Mr Cervical Spine Wo Contrast  Result Date: 05/02/2017 CLINICAL DATA:  Initial evaluation for neck pain with right shoulder and arm pain for 6-8 weeks. EXAM: MRI CERVICAL SPINE WITHOUT CONTRAST TECHNIQUE: Multiplanar, multisequence MR imaging of the cervical spine was performed. No intravenous contrast was administered. COMPARISON:  Comparison prior radiograph from 04/04/2017 as well as previous MRI from 03/21/2012. FINDINGS: Alignment: Straightening with reversal of the normal cervical lordosis, apex at C4-5. 3 mm anterolisthesis of C3 on C4. Trace anterolisthesis of C7 on T1. Vertebrae: Vertebral body heights maintained. No evidence for acute or chronic fracture. Chronic reactive endplate changes present about the C4-5, C5-6,  and C6-7 interspaces. The reactive marrow edema about the right C2-3 facet due to facet degeneration (series 3, image 1). No abnormal marrow edema. No discrete or worrisome osseous lesions. Cord: Signal intensity within the cervical spinal cord is normal. Posterior Fossa, vertebral arteries, paraspinal tissues: Probable chronic microvascular ischemic changes partially visualize within the pons/ brainstem. Visualized brain and posterior fossa otherwise unremarkable. Craniocervical junction normal. Question asymmetric edema about right posterior paraspinous musculature (series 6, image 28), incompletely visualized. This appears to largely encompass the right levator scapulae muscle. Paraspinous soft tissues otherwise normal. Normal intravascular flow voids present within the vertebral arteries bilaterally. Disc levels: C2-C3: Mild degenerative disc bulge, stable. Right worse than left uncovertebral hypertrophy, also unchanged. Progressive bilateral facet degeneration, worse on the right, with associated reactive edema about the right C2-3 facet. No significant canal stenosis. Progressive mild to moderate bilateral C3 foraminal stenosis, right slightly worse than left. C3-C4: Shallow posterior disc bulge. Uncovertebral hypertrophy on the left. Bilateral facet degeneration, also worse on  the left. Bulging disc flattens and indents the ventral thecal sac with resultant mild canal stenosis on the left, stable. Moderate left with mild right C4 foraminal stenosis, slightly progressed. C4-C5: Chronic diffuse degenerative disc osteophyte with intervertebral disc space narrowing and reactive endplate changes, progressed from previous. Associated intervertebral disc space height loss. Posterior disc osteophyte complex flattens the ventral thecal sac, slightly eccentric to the right. Resultant mild spinal stenosis with flattening of the cervical spinal cord without cord signal changes. Mild to moderate right with mild left C5  foraminal stenosis, similar to previous. C5-C6: Chronic diffuse degenerative disc osteophyte with intervertebral disc space narrowing and reactive endplate changes. Broad posterior component flattens and partially faces the ventral thecal sac with resultant mild canal stenosis. Mild flattening of the cervical spinal cord without cord signal changes. Appearance is similar to previous. Mild to moderate bilateral C6 foraminal narrowing, slightly worse on the left, similar to previous. C6-C7: Chronic diffuse degenerative disc osteophyte, a centric to the right. Flattening of the ventral thecal sac, greater on the right. Mild right-sided cord flattening without cord signal changes. Right greater than left uncovertebral spurring. Mild bilateral foraminal narrowing, greater on the right. C7-T1: Trace anterolisthesis of C7 on T1. Bilateral facet degeneration. Lobulated posterior disc bulge with bilateral uncinate spurring. No significant canal stenosis. Mild bilateral foraminal narrowing, slightly worse than left, similar to prior. Visualized upper thoracic spine within normal limits. IMPRESSION: 1. 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 Resultant mild canal stenosis at C3-4 through C6-7. 2. Multifactorial degenerative changes with resultant multilevel foraminal narrowing as detailed above. Notable findings include progressive mild to moderate bilateral C3 foraminal stenosis, moderate left C4 foraminal narrowing, and mild to moderate bilateral C6 foraminal stenosis. 3. Prominent right-sided facet degeneration at C2-3 on the right with associated reactive marrow edema. Finding could serve as a source for neck pain. 4. Apparent asymmetric edema about the right posterior musculature of the upper back, primarily encompassing the partially visualized distal right levator scapulae muscle. Finding may be related to muscular injury/strain. Electronically  Signed   By: Jeannine Boga M.D.   On: 05/02/2017 21:54    Assessment & Plan:   There are no diagnoses linked to this encounter. I am having [name] maintain his Abatacept (ORENCIA [place]), Propylene Glycol (SYSTANE BALANCE OP), predniSONE, fexofenadine, NITROSTAT, ezetimibe, omeprazole, LORazepam, metoprolol tartrate, zolpidem, oxyCODONE-acetaminophen, lisinopril, DULoxetine, rosuvastatin, and ELIQUIS.  No orders of the defined types were placed in this encounter.    Follow-up: No Follow-up on file.  [name], MD

## 2017-12-13 NOTE — Patient Instructions (Signed)
Take Metoprolol to 1.5 tablets twice a day if rapid heart rate >90

## 2017-12-13 NOTE — Assessment & Plan Note (Signed)
[name] Prednisone

## 2017-12-13 NOTE — Assessment & Plan Note (Addendum)
Take Metoprolol to 1.5 tablets twice a day if rapid heart rate >90

## 2017-12-13 NOTE — Assessment & Plan Note (Signed)
Zetia, ASA, Toprol, Crestor 

## 2017-12-27 ENCOUNTER — Other Ambulatory Visit: Payer: Self-pay | Admitting: Internal Medicine

## 2017-12-27 DIAGNOSIS — I7300 Raynaud's syndrome without gangrene: Secondary | ICD-10-CM | POA: Diagnosis not present

## 2017-12-27 DIAGNOSIS — Z7952 Long term (current) use of systemic steroids: Secondary | ICD-10-CM | POA: Diagnosis not present

## 2017-12-27 DIAGNOSIS — M0609 Rheumatoid arthritis without rheumatoid factor, multiple sites: Secondary | ICD-10-CM | POA: Diagnosis not present

## 2017-12-27 DIAGNOSIS — Z79899 Other long term (current) drug therapy: Secondary | ICD-10-CM | POA: Diagnosis not present

## 2018-01-05 DIAGNOSIS — M0609 Rheumatoid arthritis without rheumatoid factor, multiple sites: Secondary | ICD-10-CM | POA: Diagnosis not present

## 2018-02-02 DIAGNOSIS — I7300 Raynaud's syndrome without gangrene: Secondary | ICD-10-CM | POA: Diagnosis not present

## 2018-02-02 DIAGNOSIS — Z7952 Long term (current) use of systemic steroids: Secondary | ICD-10-CM | POA: Diagnosis not present

## 2018-02-02 DIAGNOSIS — M0609 Rheumatoid arthritis without rheumatoid factor, multiple sites: Secondary | ICD-10-CM | POA: Diagnosis not present

## 2018-02-02 DIAGNOSIS — Z79899 Other long term (current) drug therapy: Secondary | ICD-10-CM | POA: Diagnosis not present

## 2018-02-05 ENCOUNTER — Other Ambulatory Visit: Payer: Self-pay | Admitting: Internal Medicine

## 2018-02-05 DIAGNOSIS — I10 Essential (primary) hypertension: Secondary | ICD-10-CM | POA: Diagnosis not present

## 2018-02-05 DIAGNOSIS — B349 Viral infection, unspecified: Secondary | ICD-10-CM | POA: Diagnosis not present

## 2018-02-06 ENCOUNTER — Ambulatory Visit: Payer: Self-pay | Admitting: *Deleted

## 2018-02-06 MED ORDER — OMEPRAZOLE 40 MG PO CPDR: 40.0000 mg | DELAYED_RELEASE_CAPSULE | Freq: Every day | ORAL | 1 refills | Status: DC

## 2018-02-06 NOTE — Telephone Encounter (Signed)
Patient's wife is calling to report that her husband is having symptoms since his infusion of Orencia on Friday. Patient had Infiltration of vein on Friday. He has been having sweating, hot/cold, vomiting, strange dreams. They are driving back to [place] now- per office- they are to go to ED- patient notified. Reason for Disposition . Caller has URGENT medication question about med that PCP prescribed and triager unable to answer question  Answer Assessment - Initial Assessment Questions 1. SYMPTOMS: "Do you have any symptoms?"     Sweating, vomiting, hallucinating,gas, hot/cold 2. SEVERITY: If symptoms are present, ask "Are they mild, moderate or severe?"     Nausea, extreme hot/cold  Protocols used: MEDICATION QUESTION CALL-A-AH

## 2018-02-07 ENCOUNTER — Other Ambulatory Visit (INDEPENDENT_AMBULATORY_CARE_PROVIDER_SITE_OTHER): Payer: Medicare Other

## 2018-02-07 ENCOUNTER — Ambulatory Visit (INDEPENDENT_AMBULATORY_CARE_PROVIDER_SITE_OTHER): Payer: Medicare Other | Admitting: Internal Medicine

## 2018-02-07 ENCOUNTER — Telehealth: Payer: Self-pay | Admitting: Internal Medicine

## 2018-02-07 ENCOUNTER — Encounter: Payer: Self-pay | Admitting: Internal Medicine

## 2018-02-07 VITALS — BP 140/82 | HR 90 | Temp 98.0°F | Ht 72.0 in | Wt 176.0 lb

## 2018-02-07 DIAGNOSIS — R079 Chest pain, unspecified: Secondary | ICD-10-CM

## 2018-02-07 DIAGNOSIS — R112 Nausea with vomiting, unspecified: Secondary | ICD-10-CM

## 2018-02-07 DIAGNOSIS — R21 Rash and other nonspecific skin eruption: Secondary | ICD-10-CM | POA: Diagnosis not present

## 2018-02-07 DIAGNOSIS — I25119 Atherosclerotic heart disease of native coronary artery with unspecified angina pectoris: Secondary | ICD-10-CM

## 2018-02-07 DIAGNOSIS — R6883 Chills (without fever): Secondary | ICD-10-CM

## 2018-02-07 LAB — CBC WITH DIFFERENTIAL/PLATELET
Basophils Absolute: 0 10*3/uL (ref 0.0–0.1)
Basophils Relative: 0.6 % (ref 0.0–3.0)
Eosinophils Absolute: 0 10*3/uL (ref 0.0–0.7)
Eosinophils Relative: 0.8 % (ref 0.0–5.0)
HCT: 44.7 % (ref 39.0–52.0)
Hemoglobin: 15.1 g/dL (ref 13.0–17.0)
Lymphocytes Relative: 32.3 % (ref 12.0–46.0)
Lymphs Abs: 1.7 10*3/uL (ref 0.7–4.0)
MCHC: 33.8 g/dL (ref 30.0–36.0)
MCV: 101 fl — ABNORMAL HIGH (ref 78.0–100.0)
Monocytes Absolute: 0.4 10*3/uL (ref 0.1–1.0)
Monocytes Relative: 6.8 % (ref 3.0–12.0)
Neutro Abs: 3.1 10*3/uL (ref 1.4–7.7)
Neutrophils Relative %: 59.5 % (ref 43.0–77.0)
Platelets: 317 10*3/uL (ref 150.0–400.0)
RBC: 4.43 Mil/uL (ref 4.22–5.81)
RDW: 13.8 % (ref 11.5–15.5)
WBC: 5.2 10*3/uL (ref 4.0–10.5)

## 2018-02-07 LAB — BASIC METABOLIC PANEL
BUN: 20 mg/dL (ref 6–23)
CO2: 26 mEq/L (ref 19–32)
Calcium: 10.3 mg/dL (ref 8.4–10.5)
Chloride: 102 mEq/L (ref 96–112)
Creatinine, Ser: 0.96 mg/dL (ref 0.40–1.50)
GFR: 79.61 mL/min (ref >60.00)
Glucose, Bld: 124 mg/dL — ABNORMAL HIGH (ref 70–99)
Potassium: 3.6 mEq/L (ref 3.5–5.1)
Sodium: 140 mEq/L (ref 135–145)

## 2018-02-07 LAB — URINALYSIS
Bilirubin Urine: NEGATIVE
Ketones, ur: NEGATIVE
Leukocytes, UA: NEGATIVE
Nitrite: NEGATIVE
Specific Gravity, Urine: 1.025 (ref 1.000–1.030)
Total Protein, Urine: NEGATIVE
Urine Glucose: NEGATIVE
Urobilinogen, UA: 0.2 (ref 0.0–1.0)
pH: 6 (ref 5.0–8.0)

## 2018-02-07 LAB — HEPATIC FUNCTION PANEL
ALT: 19 U/L (ref 0–53)
AST: 17 U/L (ref 0–37)
Albumin: 4.7 g/dL (ref 3.5–5.2)
Alkaline Phosphatase: 55 U/L (ref 39–117)
Bilirubin, Direct: 0.1 mg/dL (ref 0.0–0.3)
Total Bilirubin: 1 mg/dL (ref 0.2–1.2)
Total Protein: 8.4 g/dL — ABNORMAL HIGH (ref 6.0–8.3)

## 2018-02-07 LAB — LIPASE: Lipase: 14 U/L (ref 11.0–59.0)

## 2018-02-07 LAB — SEDIMENTATION RATE: Sed Rate: 6 mm/hr (ref 0–20)

## 2018-02-07 MED ORDER — ONDANSETRON HCL 4 MG PO TABS: 4.0000 mg | ORAL_TABLET | Freq: Three times a day (TID) | ORAL | 0 refills | Status: DC | PRN

## 2018-02-07 MED ORDER — TRIAMCINOLONE ACETONIDE 0.1 % EX CREA: 1.0000 "application " | TOPICAL_CREAM | Freq: Two times a day (BID) | CUTANEOUS | 1 refills | Status: DC

## 2018-02-07 MED ORDER — METHYLPREDNISOLONE 4 MG PO TBPK: ORAL_TABLET | ORAL | 0 refills | Status: DC

## 2018-02-07 NOTE — Progress Notes (Signed)
Subjective:  Patient ID: [name], male    DOB: [birth year]  Age: 84 y.o. MRN: [id number]  CC: No chief complaint on file.   HPI [name] presents for sweats, chills, n/v and CP after Orencia shot in [place] last week. The Rx extravasated. He went to UC: checked out ok. Drove last night to [place]. C/o rash on abdomen - worse after the shot  Outpatient Medications Prior to Visit  Medication Sig Dispense Refill  . Abatacept (ORENCIA [place]) Inject 1 Applicatorful into the skin See admin instructions. Every 4 weeks Next injection due 09-29-16    . DULoxetine (CYMBALTA) 60 MG capsule TAKE ONE CAPSULE EVERY MORNING 90 capsule 3  . ELIQUIS 5 MG TABS tablet TAKE ONE TABLET TWICE DAILY 180 tablet 1  . ezetimibe (ZETIA) 10 MG tablet TAKE ONE-HALF TABLET DAILY 45 tablet 3  . fexofenadine (ALLEGRA) 180 MG tablet Take 180 mg by mouth daily as needed for allergies or rhinitis.     [name] lisinopril (PRINIVIL,ZESTRIL) 5 MG tablet TAKE ONE TABLET BY MOUTH ONCE DAILY 90 tablet 1  . LORazepam (ATIVAN) 0.5 MG tablet TAKE ONE OR TWO TABLETS TWICE DAILY AS NEEDED FOR ANXIETY 60 tablet 2  . metoprolol tartrate (LOPRESSOR) 25 MG tablet TAKE ONE AND ONE-HALF TABLET TWICE DAILY (Patient taking differently: TAKE ONE TABLET TWICE DAILY) 270 tablet 3  . NITROSTAT 0.4 MG SL tablet ONE TABLET UNDER TONGUE EVERY 5 MINUTES FOR 3 DOSES AS NEEDED FOR CHEST PAIN 25 tablet 4  . omeprazole (PRILOSEC) 40 MG capsule Take 1 capsule (40 mg total) by mouth daily. 90 capsule 1  . oxyCODONE-acetaminophen (PERCOCET/ROXICET) 5-325 MG tablet TAKE ONE OR TWO TABLETS EVERY EIGHT HOURS AS NEEDED FOR MODERATE PAINOR SEVERE PAIN 90 tablet 0  . predniSONE (DELTASONE) 5 MG tablet Take 5 mg every morning by mouth.   0  . Propylene Glycol (SYSTANE BALANCE OP) Place 1 drop into both eyes daily as needed (dry eyeS).     [name] rosuvastatin (CRESTOR) 10 MG tablet TAKE ONE TABLET EACH DAY 90 tablet 3  . zolpidem (AMBIEN) 10 MG tablet TAKE ONE half  or one TABLET AT BEDTIME AS NEEDED FOR SLEEP 90 tablet 0   No facility-administered medications prior to visit.     ROS Review of Systems  Constitutional: Positive for chills, diaphoresis and fatigue. Negative for appetite change and unexpected weight change.  HENT: Negative for congestion, nosebleeds, sneezing, sore throat and trouble swallowing.   Eyes: Negative for itching and visual disturbance.  Respiratory: Negative for cough.   Cardiovascular: Positive for chest pain. Negative for palpitations and leg swelling.  Gastrointestinal: Positive for nausea. Negative for abdominal distention, blood in stool and diarrhea.  Genitourinary: Negative for frequency and hematuria.  Musculoskeletal: Negative for back pain, gait problem, joint swelling and neck pain.  Skin: Positive for rash.  Neurological: Negative for dizziness, tremors, speech difficulty and weakness.  Psychiatric/Behavioral: Negative for agitation, dysphoric mood and sleep disturbance. The patient is not nervous/anxious.     Objective:  BP 140/82 (BP Location: Left Arm, Patient Position: Sitting, Cuff Size: Normal)   Pulse 90   Temp 98 F (36.7 C) (Oral)   Ht 6' (1.829 m)   Wt 176 lb 0.6 oz (79.9 kg)   SpO2 98%   BMI 23.88 kg/m   BP Readings from Last 3 Encounters:  02/07/18 140/82  12/13/17 (!) 142/76  12/12/17 126/74    Wt Readings from Last 3 Encounters:  02/07/18  176 lb 0.6 oz (79.9 kg)  12/13/17 178 lb (80.7 kg)  12/12/17 176 lb (79.8 kg)    Physical Exam  Constitutional: He is oriented to person, place, and time. He appears well-developed. No distress.  NAD  HENT:  Mouth/Throat: Oropharynx is clear and moist.  Eyes: Pupils are equal, round, and reactive to light. Conjunctivae are normal.  Neck: Normal range of motion. No JVD present. No thyromegaly present.  Cardiovascular: Normal rate, regular rhythm, normal heart sounds and intact distal pulses. Exam reveals no gallop and no friction rub.  No  murmur heard. Pulmonary/Chest: Effort normal and breath sounds normal. No respiratory distress. He has no wheezes. He has no rales. He exhibits no tenderness.  Abdominal: Soft. Bowel sounds are normal. He exhibits no distension and no mass. There is no tenderness. There is no rebound and no guarding.  Musculoskeletal: Normal range of motion. He exhibits no edema or tenderness.  Lymphadenopathy:    He has no cervical adenopathy.  Neurological: He is alert and oriented to person, place, and time. He has normal reflexes. No cranial nerve deficit. He exhibits normal muscle tone. He displays a negative Romberg sign. Coordination and gait normal.  Skin: Skin is warm and dry. Rash noted.  Psychiatric: He has a normal mood and affect. His behavior is normal. Judgment and thought content normal.  rash on trunk Non-toxic appearing  Procedure: EKG Indication: chest pain Impression: NSR. No acute changes.   Lab Results  Component Value Date   WBC 4.5 12/13/2017   HGB 14.3 12/13/2017   HCT 42.3 12/13/2017   PLT 278.0 12/13/2017   GLUCOSE 107 (H) 12/13/2017   CHOL 164 02/21/2017   TRIG 173.0 (H) 02/21/2017   HDL 52.20 02/21/2017   LDLDIRECT 168.9 05/24/2013   LDLCALC 78 02/21/2017   ALT 23 02/21/2017   AST 20 02/21/2017   NA 143 12/13/2017   K 4.2 12/13/2017   CL 105 12/13/2017   CREATININE 0.84 12/13/2017   BUN 17 12/13/2017   CO2 25 12/13/2017   TSH 1.18 12/13/2017   PSA 2.46 02/21/2017   INR 0.92 09/13/2016   HGBA1C 6.4 08/30/2017    Mr Cervical Spine Wo Contrast  Result Date: 05/02/2017 CLINICAL DATA:  Initial evaluation for neck pain with right shoulder and arm pain for 6-8 weeks. EXAM: MRI CERVICAL SPINE WITHOUT CONTRAST TECHNIQUE: Multiplanar, multisequence MR imaging of the cervical spine was performed. No intravenous contrast was administered. COMPARISON:  Comparison prior radiograph from 04/04/2017 as well as previous MRI from 03/21/2012. FINDINGS: Alignment: Straightening  with reversal of the normal cervical lordosis, apex at C4-5. 3 mm anterolisthesis of C3 on C4. Trace anterolisthesis of C7 on T1. Vertebrae: Vertebral body heights maintained. No evidence for acute or chronic fracture. Chronic reactive endplate changes present about the C4-5, C5-6, and C6-7 interspaces. The reactive marrow edema about the right C2-3 facet due to facet degeneration (series 3, image 1). No abnormal marrow edema. No discrete or worrisome osseous lesions. Cord: Signal intensity within the cervical spinal cord is normal. Posterior Fossa, vertebral arteries, paraspinal tissues: Probable chronic microvascular ischemic changes partially visualize within the pons/ brainstem. Visualized brain and posterior fossa otherwise unremarkable. Craniocervical junction normal. Question asymmetric edema about right posterior paraspinous musculature (series 6, image 28), incompletely visualized. This appears to largely encompass the right levator scapulae muscle. Paraspinous soft tissues otherwise normal. Normal intravascular flow voids present within the vertebral arteries bilaterally. Disc levels: C2-C3: Mild degenerative disc bulge, stable. Right worse than left uncovertebral  hypertrophy, also unchanged. Progressive bilateral facet degeneration, worse on the right, with associated reactive edema about the right C2-3 facet. No significant canal stenosis. Progressive mild to moderate bilateral C3 foraminal stenosis, right slightly worse than left. C3-C4: Shallow posterior disc bulge. Uncovertebral hypertrophy on the left. Bilateral facet degeneration, also worse on the left. Bulging disc flattens and indents the ventral thecal sac with resultant mild canal stenosis on the left, stable. Moderate left with mild right C4 foraminal stenosis, slightly progressed. C4-C5: Chronic diffuse degenerative disc osteophyte with intervertebral disc space narrowing and reactive endplate changes, progressed from previous. Associated  intervertebral disc space height loss. Posterior disc osteophyte complex flattens the ventral thecal sac, slightly eccentric to the right. Resultant mild spinal stenosis with flattening of the cervical spinal cord without cord signal changes. Mild to moderate right with mild left C5 foraminal stenosis, similar to previous. C5-C6: Chronic diffuse degenerative disc osteophyte with intervertebral disc space narrowing and reactive endplate changes. Broad posterior component flattens and partially faces the ventral thecal sac with resultant mild canal stenosis. Mild flattening of the cervical spinal cord without cord signal changes. Appearance is similar to previous. Mild to moderate bilateral C6 foraminal narrowing, slightly worse on the left, similar to previous. C6-C7: Chronic diffuse degenerative disc osteophyte, a centric to the right. Flattening of the ventral thecal sac, greater on the right. Mild right-sided cord flattening without cord signal changes. Right greater than left uncovertebral spurring. Mild bilateral foraminal narrowing, greater on the right. C7-T1: Trace anterolisthesis of C7 on T1. Bilateral facet degeneration. Lobulated posterior disc bulge with bilateral uncinate spurring. No significant canal stenosis. Mild bilateral foraminal narrowing, slightly worse than left, similar to prior. Visualized upper thoracic spine within normal limits. IMPRESSION: 1. 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 Resultant mild canal stenosis at C3-4 through C6-7. 2. Multifactorial degenerative changes with resultant multilevel foraminal narrowing as detailed above. Notable findings include progressive mild to moderate bilateral C3 foraminal stenosis, moderate left C4 foraminal narrowing, and mild to moderate bilateral C6 foraminal stenosis. 3. Prominent right-sided facet degeneration at C2-3 on the right with associated reactive marrow edema.  Finding could serve as a source for neck pain. 4. Apparent asymmetric edema about the right posterior musculature of the upper back, primarily encompassing the partially visualized distal right levator scapulae muscle. Finding may be related to muscular injury/strain. Electronically Signed   By: Jeannine Boga M.D.   On: 05/02/2017 21:54    Assessment & Plan:   Diagnoses and all orders for this visit:  Chest pain, unspecified type -     EKG 12-Lead   I am having [name] maintain his Abatacept (ORENCIA [place]), Propylene Glycol (SYSTANE BALANCE OP), predniSONE, fexofenadine, NITROSTAT, ezetimibe, LORazepam, metoprolol tartrate, oxyCODONE-acetaminophen, lisinopril, DULoxetine, rosuvastatin, ELIQUIS, zolpidem, and omeprazole.  No orders of the defined types were placed in this encounter.    Follow-up: No follow-ups on file.  [name], MD

## 2018-02-07 NOTE — Assessment & Plan Note (Signed)
post-Orencia Labs

## 2018-02-07 NOTE — Telephone Encounter (Signed)
Noted, patient "seen" this morning 

## 2018-02-07 NOTE — Patient Instructions (Signed)
Go to ER if worse 

## 2018-02-07 NOTE — Assessment & Plan Note (Signed)
post-Orencia infusion Zofran

## 2018-02-07 NOTE — Telephone Encounter (Signed)
Copied from [place]. Topic: Quick Communication - See Telephone Encounter >> Feb 07, 2018  6:32 AM Hewitt Shorts wrote: CRM for notification. See Telephone encounter for: 02/07/18.pt has been in [place] and been sick with symptoms of the flu and would like something called into [facility]   Best number [phone]

## 2018-02-07 NOTE — Telephone Encounter (Signed)
Pt. Had an office visit today.

## 2018-02-07 NOTE — Assessment & Plan Note (Signed)
?  due to [place] CXR Medrol, Zofran

## 2018-02-13 ENCOUNTER — Ambulatory Visit (INDEPENDENT_AMBULATORY_CARE_PROVIDER_SITE_OTHER): Payer: Medicare Other | Admitting: Internal Medicine

## 2018-02-13 ENCOUNTER — Encounter: Payer: Self-pay | Admitting: Internal Medicine

## 2018-02-13 DIAGNOSIS — I25119 Atherosclerotic heart disease of native coronary artery with unspecified angina pectoris: Secondary | ICD-10-CM | POA: Diagnosis not present

## 2018-02-13 DIAGNOSIS — I10 Essential (primary) hypertension: Secondary | ICD-10-CM

## 2018-02-13 DIAGNOSIS — R21 Rash and other nonspecific skin eruption: Secondary | ICD-10-CM

## 2018-02-13 DIAGNOSIS — R112 Nausea with vomiting, unspecified: Secondary | ICD-10-CM

## 2018-02-13 DIAGNOSIS — M069 Rheumatoid arthritis, unspecified: Secondary | ICD-10-CM

## 2018-02-13 LAB — CULTURE, BLOOD (SINGLE): MICRO NUMBER:: 90376034

## 2018-02-13 MED ORDER — METHYLPREDNISOLONE ACETATE 80 MG/ML IJ SUSP
80.0000 mg | Freq: Once | INTRAMUSCULAR | Status: AC
  Administered 2018-02-13: 80 mg via INTRAMUSCULAR

## 2018-02-13 NOTE — Assessment & Plan Note (Signed)
?   post-Orencia infusion reaction: a little better Rash is better He will see Dr Amil Amen this week before the next Orencia infusion Depo-medrol IM

## 2018-02-13 NOTE — Assessment & Plan Note (Signed)
Lisinopril, Toprol 

## 2018-02-13 NOTE — Progress Notes (Signed)
Subjective:  Patient ID: [name], male    DOB: [birth year]  Age: 84 y.o. MRN: [id number]  CC: No chief complaint on file.   HPI Navin Dogan Lagan presents for ?orencia reaction f/u. Better, but still having chills. Appetite is ok He felt better on Medrol x 1-2 days. The rash is much better..  Outpatient Medications Prior to Visit  Medication Sig Dispense Refill  . Abatacept (ORENCIA Hand) Inject 1 Applicatorful into the skin See admin instructions. Every 4 weeks Next injection due 09-29-16    . DULoxetine (CYMBALTA) 60 MG capsule TAKE ONE CAPSULE EVERY MORNING 90 capsule 3  . ELIQUIS 5 MG TABS tablet TAKE ONE TABLET TWICE DAILY 180 tablet 1  . ezetimibe (ZETIA) 10 MG tablet TAKE ONE-HALF TABLET DAILY 45 tablet 3  . fexofenadine (ALLEGRA) 180 MG tablet Take 180 mg by mouth daily as needed for allergies or rhinitis.     [name] lisinopril (PRINIVIL,ZESTRIL) 5 MG tablet TAKE ONE TABLET BY MOUTH ONCE DAILY 90 tablet 1  . LORazepam (ATIVAN) 0.5 MG tablet TAKE ONE OR TWO TABLETS TWICE DAILY AS NEEDED FOR ANXIETY 60 tablet 2  . methylPREDNISolone (MEDROL DOSEPAK) 4 MG TBPK tablet As directed 21 tablet 0  . metoprolol tartrate (LOPRESSOR) 25 MG tablet TAKE ONE AND ONE-HALF TABLET TWICE DAILY (Patient taking differently: TAKE ONE TABLET TWICE DAILY) 270 tablet 3  . NITROSTAT 0.4 MG SL tablet ONE TABLET UNDER TONGUE EVERY 5 MINUTES FOR 3 DOSES AS NEEDED FOR CHEST PAIN 25 tablet 4  . omeprazole (PRILOSEC) 40 MG capsule Take 1 capsule (40 mg total) by mouth daily. 90 capsule 1  . ondansetron (ZOFRAN) 4 MG tablet Take 1 tablet (4 mg total) by mouth every 8 (eight) hours as needed for nausea or vomiting. 20 tablet 0  . oxyCODONE-acetaminophen (PERCOCET/ROXICET) 5-325 MG tablet TAKE ONE OR TWO TABLETS EVERY EIGHT HOURS AS NEEDED FOR MODERATE PAINOR SEVERE PAIN 90 tablet 0  . predniSONE (DELTASONE) 5 MG tablet Take 5 mg every morning by mouth.   0  . Propylene Glycol (SYSTANE BALANCE OP) Place 1 drop into both  eyes daily as needed (dry eyeS).     [name] rosuvastatin (CRESTOR) 10 MG tablet TAKE ONE TABLET EACH DAY 90 tablet 3  . triamcinolone cream (KENALOG) 0.1 % Apply 1 application topically 2 (two) times daily. 80 g 1  . zolpidem (AMBIEN) 10 MG tablet TAKE ONE half or one TABLET AT BEDTIME AS NEEDED FOR SLEEP 90 tablet 0   No facility-administered medications prior to visit.     ROS Review of Systems  Constitutional: Positive for chills. Negative for appetite change, fatigue and unexpected weight change.  HENT: Negative for congestion, nosebleeds, sneezing, sore throat and trouble swallowing.   Eyes: Negative for itching and visual disturbance.  Respiratory: Negative for cough.   Cardiovascular: Negative for chest pain, palpitations and leg swelling.  Gastrointestinal: Positive for nausea. Negative for abdominal distention, blood in stool and diarrhea.  Genitourinary: Negative for frequency and hematuria.  Musculoskeletal: Positive for arthralgias and myalgias. Negative for back pain, gait problem, joint swelling and neck pain.  Skin: Negative for rash.  Neurological: Negative for dizziness, tremors, speech difficulty and weakness.  Psychiatric/Behavioral: Negative for agitation, dysphoric mood and sleep disturbance. The patient is not nervous/anxious.     Objective:  BP 132/74 (BP Location: Left Arm, Patient Position: Sitting, Cuff Size: Large)   Pulse 63   Temp 98 F (36.7 C) (Oral)   Ht 6' (1.829 m)  Wt 174 lb (78.9 kg)   SpO2 98%   BMI 23.60 kg/m   BP Readings from Last 3 Encounters:  02/13/18 132/74  02/07/18 140/82  12/13/17 (!) 142/76    Wt Readings from Last 3 Encounters:  02/13/18 174 lb (78.9 kg)  02/07/18 176 lb 0.6 oz (79.9 kg)  12/13/17 178 lb (80.7 kg)    Physical Exam  Constitutional: He is oriented to person, place, and time. He appears well-developed. No distress.  NAD  HENT:  Mouth/Throat: Oropharynx is clear and moist.  Eyes: Pupils are equal, round, and  reactive to light. Conjunctivae are normal.  Neck: Normal range of motion. No JVD present. No thyromegaly present.  Cardiovascular: Normal rate, regular rhythm, normal heart sounds and intact distal pulses. Exam reveals no gallop and no friction rub.  No murmur heard. Pulmonary/Chest: Effort normal and breath sounds normal. No respiratory distress. He has no wheezes. He has no rales. He exhibits no tenderness.  Abdominal: Soft. Bowel sounds are normal. He exhibits no distension and no mass. There is no tenderness. There is no rebound and no guarding.  Musculoskeletal: Normal range of motion. He exhibits no edema or tenderness.  Lymphadenopathy:    He has no cervical adenopathy.  Neurological: He is alert and oriented to person, place, and time. He has normal reflexes. No cranial nerve deficit. He exhibits normal muscle tone. He displays a negative Romberg sign. Coordination and gait normal.  Skin: Skin is warm and dry. No rash noted.  Psychiatric: He has a normal mood and affect. His behavior is normal. Judgment and thought content normal.    Lab Results  Component Value Date   WBC 5.2 02/07/2018   HGB 15.1 02/07/2018   HCT 44.7 02/07/2018   PLT 317.0 02/07/2018   GLUCOSE 124 (H) 02/07/2018   CHOL 164 02/21/2017   TRIG 173.0 (H) 02/21/2017   HDL 52.20 02/21/2017   LDLDIRECT 168.9 05/24/2013   LDLCALC 78 02/21/2017   ALT 19 02/07/2018   AST 17 02/07/2018   NA 140 02/07/2018   K 3.6 02/07/2018   CL 102 02/07/2018   CREATININE 0.96 02/07/2018   BUN 20 02/07/2018   CO2 26 02/07/2018   TSH 1.18 12/13/2017   PSA 2.46 02/21/2017   INR 0.92 09/13/2016   HGBA1C 6.4 08/30/2017    Mr Cervical Spine Wo Contrast  Result Date: 05/02/2017 CLINICAL DATA:  Initial evaluation for neck pain with right shoulder and arm pain for 6-8 weeks. EXAM: MRI CERVICAL SPINE WITHOUT CONTRAST TECHNIQUE: Multiplanar, multisequence MR imaging of the cervical spine was performed. No intravenous contrast was  administered. COMPARISON:  Comparison prior radiograph from 04/04/2017 as well as previous MRI from 03/21/2012. FINDINGS: Alignment: Straightening with reversal of the normal cervical lordosis, apex at C4-5. 3 mm anterolisthesis of C3 on C4. Trace anterolisthesis of C7 on T1. Vertebrae: Vertebral body heights maintained. No evidence for acute or chronic fracture. Chronic reactive endplate changes present about the C4-5, C5-6, and C6-7 interspaces. The reactive marrow edema about the right C2-3 facet due to facet degeneration (series 3, image 1). No abnormal marrow edema. No discrete or worrisome osseous lesions. Cord: Signal intensity within the cervical spinal cord is normal. Posterior Fossa, vertebral arteries, paraspinal tissues: Probable chronic microvascular ischemic changes partially visualize within the pons/ brainstem. Visualized brain and posterior fossa otherwise unremarkable. Craniocervical junction normal. Question asymmetric edema about right posterior paraspinous musculature (series 6, image 28), incompletely visualized. This appears to largely encompass the right levator scapulae muscle.  Paraspinous soft tissues otherwise normal. Normal intravascular flow voids present within the vertebral arteries bilaterally. Disc levels: C2-C3: Mild degenerative disc bulge, stable. Right worse than left uncovertebral hypertrophy, also unchanged. Progressive bilateral facet degeneration, worse on the right, with associated reactive edema about the right C2-3 facet. No significant canal stenosis. Progressive mild to moderate bilateral C3 foraminal stenosis, right slightly worse than left. C3-C4: Shallow posterior disc bulge. Uncovertebral hypertrophy on the left. Bilateral facet degeneration, also worse on the left. Bulging disc flattens and indents the ventral thecal sac with resultant mild canal stenosis on the left, stable. Moderate left with mild right C4 foraminal stenosis, slightly progressed. C4-C5: Chronic  diffuse degenerative disc osteophyte with intervertebral disc space narrowing and reactive endplate changes, progressed from previous. Associated intervertebral disc space height loss. Posterior disc osteophyte complex flattens the ventral thecal sac, slightly eccentric to the right. Resultant mild spinal stenosis with flattening of the cervical spinal cord without cord signal changes. Mild to moderate right with mild left C5 foraminal stenosis, similar to previous. C5-C6: Chronic diffuse degenerative disc osteophyte with intervertebral disc space narrowing and reactive endplate changes. Broad posterior component flattens and partially faces the ventral thecal sac with resultant mild canal stenosis. Mild flattening of the cervical spinal cord without cord signal changes. Appearance is similar to previous. Mild to moderate bilateral C6 foraminal narrowing, slightly worse on the left, similar to previous. C6-C7: Chronic diffuse degenerative disc osteophyte, a centric to the right. Flattening of the ventral thecal sac, greater on the right. Mild right-sided cord flattening without cord signal changes. Right greater than left uncovertebral spurring. Mild bilateral foraminal narrowing, greater on the right. C7-T1: Trace anterolisthesis of C7 on T1. Bilateral facet degeneration. Lobulated posterior disc bulge with bilateral uncinate spurring. No significant canal stenosis. Mild bilateral foraminal narrowing, slightly worse than left, similar to prior. Visualized upper thoracic spine within normal limits. IMPRESSION: 1. 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 Resultant mild canal stenosis at C3-4 through C6-7. 2. Multifactorial degenerative changes with resultant multilevel foraminal narrowing as detailed above. Notable findings include progressive mild to moderate bilateral C3 foraminal stenosis, moderate left C4 foraminal narrowing, and mild to  moderate bilateral C6 foraminal stenosis. 3. Prominent right-sided facet degeneration at C2-3 on the right with associated reactive marrow edema. Finding could serve as a source for neck pain. 4. Apparent asymmetric edema about the right posterior musculature of the upper back, primarily encompassing the partially visualized distal right levator scapulae muscle. Finding may be related to muscular injury/strain. Electronically Signed   By: Jeannine Boga M.D.   On: 05/02/2017 21:54    Assessment & Plan:   There are no diagnoses linked to this encounter. I am having [name] maintain his Abatacept (ORENCIA [place]), Propylene Glycol (SYSTANE BALANCE OP), predniSONE, fexofenadine, NITROSTAT, ezetimibe, LORazepam, metoprolol tartrate, oxyCODONE-acetaminophen, lisinopril, DULoxetine, rosuvastatin, ELIQUIS, zolpidem, omeprazole, ondansetron, methylPREDNISolone, and triamcinolone cream.  No orders of the defined types were placed in this encounter.    Follow-up: No follow-ups on file.  [name], MD

## 2018-02-15 DIAGNOSIS — R05 Cough: Secondary | ICD-10-CM | POA: Diagnosis not present

## 2018-02-15 DIAGNOSIS — J301 Allergic rhinitis due to pollen: Secondary | ICD-10-CM | POA: Diagnosis not present

## 2018-02-15 DIAGNOSIS — Z6825 Body mass index (BMI) 25.0-25.9, adult: Secondary | ICD-10-CM | POA: Diagnosis not present

## 2018-02-15 DIAGNOSIS — M0609 Rheumatoid arthritis without rheumatoid factor, multiple sites: Secondary | ICD-10-CM | POA: Diagnosis not present

## 2018-02-15 DIAGNOSIS — E663 Overweight: Secondary | ICD-10-CM | POA: Diagnosis not present

## 2018-02-15 DIAGNOSIS — M5030 Other cervical disc degeneration, unspecified cervical region: Secondary | ICD-10-CM | POA: Diagnosis not present

## 2018-02-20 ENCOUNTER — Other Ambulatory Visit: Payer: Self-pay | Admitting: Internal Medicine

## 2018-02-21 DIAGNOSIS — M4712 Other spondylosis with myelopathy, cervical region: Secondary | ICD-10-CM | POA: Diagnosis not present

## 2018-03-02 DIAGNOSIS — M0609 Rheumatoid arthritis without rheumatoid factor, multiple sites: Secondary | ICD-10-CM | POA: Diagnosis not present

## 2018-03-09 ENCOUNTER — Other Ambulatory Visit: Payer: Self-pay | Admitting: Internal Medicine

## 2018-03-09 ENCOUNTER — Telehealth: Payer: Self-pay | Admitting: Internal Medicine

## 2018-03-09 NOTE — Telephone Encounter (Signed)
Copied from [place] #[id number]. Topic: Quick Communication - Rx Refill/Question >> Mar 09, 2018 12:24 PM Yvette Rack wrote: Medication: oxyCODONE-acetaminophen (PERCOCET/ROXICET) 5-325 MG tablet   [place] he leaves next Wednesday Has the patient contacted their pharmacy? Yes.   (Agent: If no, request that the patient contact the pharmacy for the refill.) Preferred Pharmacy (with phone number or street name): [facility], [place] - [address] [phone] (Phone) [phone] (Fax)     Agent: Please be advised that RX refills may take up to 3 business days. We ask that you follow-up with your pharmacy.

## 2018-03-10 DIAGNOSIS — H52203 Unspecified astigmatism, bilateral: Secondary | ICD-10-CM | POA: Diagnosis not present

## 2018-03-10 DIAGNOSIS — Z961 Presence of intraocular lens: Secondary | ICD-10-CM | POA: Diagnosis not present

## 2018-03-10 DIAGNOSIS — H524 Presbyopia: Secondary | ICD-10-CM | POA: Diagnosis not present

## 2018-03-10 NOTE — Telephone Encounter (Signed)
Ok Thx 

## 2018-03-10 NOTE — Telephone Encounter (Signed)
Notified pt rx has been sent to [email or web address] 

## 2018-04-01 ENCOUNTER — Emergency Department (HOSPITAL_COMMUNITY): Payer: Medicare Other

## 2018-04-01 ENCOUNTER — Encounter (HOSPITAL_COMMUNITY): Payer: Self-pay | Admitting: Emergency Medicine

## 2018-04-01 ENCOUNTER — Emergency Department (HOSPITAL_COMMUNITY)
Admission: EM | Admit: 2018-04-01 | Discharge: 2018-04-01 | Disposition: A | Discharge status: Home / Self Care | Payer: Medicare Other | Attending: Emergency Medicine | Admitting: Emergency Medicine

## 2018-04-01 DIAGNOSIS — I10 Essential (primary) hypertension: Secondary | ICD-10-CM | POA: Diagnosis not present

## 2018-04-01 DIAGNOSIS — Z87891 Personal history of nicotine dependence: Secondary | ICD-10-CM | POA: Insufficient documentation

## 2018-04-01 DIAGNOSIS — Z79899 Other long term (current) drug therapy: Secondary | ICD-10-CM | POA: Insufficient documentation

## 2018-04-01 DIAGNOSIS — R55 Syncope and collapse: Secondary | ICD-10-CM | POA: Diagnosis not present

## 2018-04-01 DIAGNOSIS — R002 Palpitations: Secondary | ICD-10-CM | POA: Insufficient documentation

## 2018-04-01 DIAGNOSIS — R404 Transient alteration of awareness: Secondary | ICD-10-CM | POA: Diagnosis not present

## 2018-04-01 DIAGNOSIS — I2510 Atherosclerotic heart disease of native coronary artery without angina pectoris: Secondary | ICD-10-CM | POA: Insufficient documentation

## 2018-04-01 LAB — CBC
HCT: 40.2 % (ref 39.0–52.0)
Hemoglobin: 13.4 g/dL (ref 13.0–17.0)
MCH: 32.9 pg (ref 26.0–34.0)
MCHC: 33.3 g/dL (ref 30.0–36.0)
MCV: 98.8 fL (ref 78.0–100.0)
Platelets: 280 10*3/uL (ref 150–400)
RBC: 4.07 MIL/uL — ABNORMAL LOW (ref 4.22–5.81)
RDW: 13.3 % (ref 11.5–15.5)
WBC: 8.2 10*3/uL (ref 4.0–10.5)

## 2018-04-01 LAB — BASIC METABOLIC PANEL
Anion gap: 9 (ref 5–15)
BUN: 16 mg/dL (ref 6–20)
CO2: 26 mmol/L (ref 22–32)
Calcium: 9.4 mg/dL (ref 8.9–10.3)
Chloride: 106 mmol/L (ref 101–111)
Creatinine, Ser: 1.08 mg/dL (ref 0.61–1.24)
GFR calc Af Amer: >60 mL/min (ref >60)
GFR calc non Af Amer: >60 mL/min (ref >60)
Glucose, Bld: 150 mg/dL — ABNORMAL HIGH (ref 65–99)
Potassium: 4.1 mmol/L (ref 3.5–5.1)
Sodium: 141 mmol/L (ref 135–145)

## 2018-04-01 LAB — I-STAT TROPONIN, ED
Troponin i, poc: 0.03 ng/mL (ref 0.00–0.08)
Troponin i, poc: 0.05 ng/mL (ref 0.00–0.08)

## 2018-04-01 MED ORDER — METOPROLOL TARTRATE 25 MG PO TABS
25.0000 mg | ORAL_TABLET | Freq: Once | ORAL | Status: AC
  Administered 2018-04-01: 25 mg via ORAL
  Filled 2018-04-01: qty 1

## 2018-04-01 MED ORDER — IOPAMIDOL (ISOVUE-370) INJECTION 76%
100.0000 mL | Freq: Once | INTRAVENOUS | Status: AC | PRN
Start: 2018-04-01 — End: 2018-04-01
  Administered 2018-04-01: 100 mL via INTRAVENOUS

## 2018-04-01 MED ORDER — IOPAMIDOL (ISOVUE-370) INJECTION 76%
INTRAVENOUS | Status: AC
  Filled 2018-04-01: qty 100

## 2018-04-01 NOTE — ED Provider Notes (Signed)
[facility] EMERGENCY DEPARTMENT Provider Note   CSN: [id number] Arrival date & time: 04/01/18  1043     History   Chief Complaint Chief Complaint  Patient presents with  . near snycope    HPI [name] is a 84 y.o. male who presents with near syncope. PMH significant for CAD, RA on immunosuppressants, GERD, PAF on Eliquis , HTN, HLD.  He states that him and his wife have been traveling in [place] for the past couple of weeks.  They got back yesterday.  This morning the patient went on a heart walk for [facility].  About a mile and he started feeling faint, weak and felt like his heart was racing.  He sat down and drink some water and tried to finish however there was a nurse at the race who recommended him to come to the emergency department if he was not feeling better.  He then went to the EMS station at the walk and they checked him out and recommended for him to come to the emergency department because of his history.  Currently the patient states he feels improved.  He denies headache, chest pain, shortness of breath, abdominal pain.  He has not had any blood in the urine or stool.  He does have bleeding from his legs but this is not changed. Echo in 2017 showed pEF of 50-55%  HPI  Past Medical History:  Diagnosis Date  . CAD (coronary artery disease) 12/2013--  cardiologist-  dr allred/ dr turner   s/p inferior STEMI 01/08/2014; [place] 01/08/14: total RCA occlusion s/p 3.5x109mm Xience DES distal RCA and 3.5x28 mm DES mid RCA, 60-70% mid LAD stenosis, EF 55%  . Complication of anesthesia    'long to wake up after back surgery " 09/2003  . Diverticulosis of colon   . GERD (gastroesophageal reflux disease)   . History of cellulitis    05-26-2015  LLE  . History of colon polyps    1998- benign/  2008 adenomatous   . History of kidney stones    2013  . History of squamous cell carcinoma excision    2013;  2015;  06-12-2015 right leg/  02/ and 05/ 2017  left ear and  left leg  . History of ST elevation myocardial infarction (STEMI) 01/08/2014   acute infertor--  s/p  DES x2 to RCA  . Hydronephrosis, left   . Hyperlipidemia   . Hypertension   . Migraine with aura   . OSA on CPAP 06/19/2015   Moderate OSA with AHI 18/hr  per study 05-20-2015  . Osteoarthritis   . Palpitations   . Paroxysmal atrial fibrillation ([place]) 4/16   chads2vasc score is at least 4  . Premature atrial contractions   . RA (rheumatoid arthritis) [facility])    rheumatologist-  dr Leigh Aurora  . S/P drug eluting coronary stent placement 01/08/2014   x2  to mid and distal RCA  . Sinusitis, chronic 10/17/2015  . Wears glasses   . Wears hearing aid    bilateral    Patient Active Problem List   Diagnosis Date Noted  . Nausea & vomiting 02/07/2018  . Chills 02/07/2018  . Rash 02/07/2018  . Insomnia 09/22/2017  . Cervical spondylolysis 06/28/2017  . Hyperglycemia 06/22/2017  . Well adult exam 11/18/2016  . Hydronephrosis 11/18/2016  . UPJ obstruction, 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  . Allergic rhinitis 06/02/2016  . Asthma with exacerbation 06/02/2016  . Sinusitis, chronic 10/17/2015  . OSA (obstructive sleep apnea)  06/19/2015  . Cellulitis and abscess of leg, except foot 05/26/2015  . Paroxysmal atrial fibrillation ([place]) 02/21/2015  . CAP (community acquired pneumonia) 01/02/2015  . Vivid dream 09/25/2014  . Actinic keratoses 04/26/2014  . CAD (coronary artery disease) 02/25/2014  . STEMI (ST elevation myocardial infarction) ([place]) 01/08/2014  . Rheumatoid bursitis of left elbow ([place]) 08/08/2013  . Situational depression 06/18/2013  . Neck pain 04/23/2012  . Acute abdominal pain in left flank 02/14/2012  . Hypertension 09/23/2011  . Chest pain, midsternal 09/13/2011  . Mixed hyperlipidemia 07/23/2010  . Palpitations 07/07/2010  . Rheumatoid arthritis ([place]) 06/04/2010  . PARESTHESIA 06/04/2010  . ARTHRALGIA 11/27/2009  . MYALGIA 08/21/2009  . ALLERGIC RHINITIS 06/11/2008  .  OSTEOARTHRITIS 06/11/2008  . FATIGUE 06/11/2008  . LOW BACK PAIN 07/27/2007  . COLONIC POLYPS, HX OF 07/27/2007    Past Surgical History:  Procedure Laterality Date  . BACK SURGERY    . CARDIOVASCULAR STRESS TEST  11/21/2015   Low risk nuclear study w/ a small diaphragmatic attenuation artifact, no ischemia/  normal LV function and wall motion , ef 63%  . CATARACT EXTRACTION W/ INTRAOCULAR LENS  IMPLANT, BILATERAL  2015  . COLONOSCOPY  last one 06-09-2011  . CORONARY ANGIOPLASTY    . CYSTOSCOPY W/ RETROGRADES Left 07/12/2016   Procedure: CYSTOSCOPY WITH RETROGRADE PYELOGRAM LEFT URETERAL STENT;  Surgeon: Irine Seal, MD;  Location: [facility];  Service: Urology;  Laterality: Left;  . CYSTOSCOPY WITH RETROGRADE PYELOGRAM, URETEROSCOPY AND STENT PLACEMENT Left 08/11/2016   Procedure: CYSTOSCOPY WITH RETROGRADE PYELOGRAM,  DIAGNOSTIC URETEROSCOPY , STENT EXCHANGE;  Surgeon: Alexis Frock, MD;  Location: [facility];  Service: Urology;  Laterality: Left;  . 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   severe fibromatosis palm and fingers  . LEFT HEART CATHETERIZATION WITH CORONARY ANGIOGRAM N/A 01/08/2014   Procedure: LEFT HEART CATHETERIZATION WITH CORONARY ANGIOGRAM;  Surgeon: Troy Sine, MD;  Location: [facility] CATH LAB;  Service: Cardiovascular;  Laterality:N/A;  total/ subtotal RCA/  mLAD 19-14% w/ mid systolic bridging/  preserved global LVF, ef 55%  . MOHS SURGERY  x2  feb and may 2017   left ear /  left leg  ([facility])  . PERCUTANEOUS CORONARY STENT INTERVENTION (PCI-S)  01/08/2014   Procedure: PERCUTANEOUS CORONARY STENT INTERVENTION (PCI-S);  Surgeon: Troy Sine, MD;  Location[facility];  Service: Cardiovascular;;  DES to mid and distal RCA  . POSTERIOR LUMBAR FUSION  10/01/2003   and Laminectomy/ diskectomy  L4 -- S1  . ROBOT ASSISTED PYELOPLASTY Left 09/15/2016   Procedure: XI ROBOTIC ASSISTED PYELOPLASTY;  Surgeon: Alexis Frock, MD;  Location: [facility];  Service:  Urology;  Laterality: Left;  . TONSILLECTOMY    . TRANSTHORACIC ECHOCARDIOGRAM  02/23/2016   mild LVH,  ef 50-55%,  grade 1 diastolic dysfunction/  mild to moderate AV calcification w/ no stenosis or regurg./  trivial MR and TR/  mild PR        Home Medications    Prior to Admission medications   Medication Sig Start Date End Date Taking? Authorizing Provider  Abatacept (ORENCIA [place]) Inject 1 Applicatorful into the skin See admin instructions. Every 4 weeks Next injection due 09-29-16    [provider]  DULoxetine (CYMBALTA) 60 MG capsule TAKE ONE CAPSULE EVERY MORNING 12/09/17   Plotnikov, Evie Lacks, MD  ELIQUIS 5 MG TABS tablet TAKE ONE TABLET TWICE DAILY 12/08/17   Sueanne Margarita, MD  ezetimibe (ZETIA) 10 MG tablet TAKE ONE-HALF TABLET  DAILY 02/21/18   Plotnikov, Evie Lacks, MD  fexofenadine (ALLEGRA) 180 MG tablet Take 180 mg by mouth daily as needed for allergies or rhinitis.     [provider]  lisinopril (PRINIVIL,ZESTRIL) 5 MG tablet TAKE ONE TABLET BY MOUTH ONCE DAILY 12/08/17   Allred, Jeneen Rinks, MD  LORazepam (ATIVAN) 0.5 MG tablet TAKE ONE OR TWO TABLETS TWICE DAILY AS NEEDED FOR ANXIETY 03/29/17   Plotnikov, Evie Lacks, MD  methylPREDNISolone (MEDROL DOSEPAK) 4 MG TBPK tablet As directed 02/07/18   Plotnikov, Evie Lacks, MD  metoprolol tartrate (LOPRESSOR) 25 MG tablet TAKE ONE AND ONE-HALF TABLET TWICE DAILY Patient taking differently: TAKE ONE TABLET TWICE DAILY 06/17/17   Allred, Jeneen Rinks, MD  nitroGLYCERIN (NITROSTAT) 0.4 MG SL tablet ONE TABLET UNDER TONGUE AS NEEDED FOR CHEST PAIN. MAY REPEAT IN 5 MINUTES AND AGAIN IN 5 MINUTES (TOTAL OF 3 TABLETS) 03/10/18   Allred, Jeneen Rinks, MD  omeprazole (PRILOSEC) 40 MG capsule Take 1 capsule (40 mg total) by mouth daily. 02/06/18   Plotnikov, Evie Lacks, MD  ondansetron (ZOFRAN) 4 MG tablet Take 1 tablet (4 mg total) by mouth every 8 (eight) hours as needed for nausea or vomiting. 02/07/18   Plotnikov, Evie Lacks, MD    oxyCODONE-acetaminophen (PERCOCET/ROXICET) 5-325 MG tablet TAKE 1 OR 2 TABLETS EVERY 8 HOURS AS NEEDED FOR MODERATE PAIN OR SEVERE PAIN 03/09/18   Plotnikov, Evie Lacks, MD  predniSONE (DELTASONE) 5 MG tablet Take 5 mg every morning by mouth.  06/10/16   [provider]  Propylene Glycol (SYSTANE BALANCE OP) Place 1 drop into both eyes daily as needed (dry eyeS).     [provider]  rosuvastatin (CRESTOR) 10 MG tablet TAKE ONE TABLET EACH DAY 12/09/17   Plotnikov, Evie Lacks, MD  triamcinolone cream (KENALOG) 0.1 % Apply 1 application topically 2 (two) times daily. 02/07/18   Plotnikov, Evie Lacks, MD  zolpidem (AMBIEN) 10 MG tablet TAKE ONE half or one TABLET AT BEDTIME AS NEEDED FOR SLEEP 12/13/17   Plotnikov, Evie Lacks, MD    Family History Family History  Problem Relation Age of Onset  . Hypertension Mother   . Heart disease Father   . Colon cancer Neg Hx     Social History Social History   Tobacco Use  . Smoking status: Former Smoker    Years: 10.00    Types: Cigarettes    Last attempt to quit: 08/09/1968    Years since quitting: 49.6  . Smokeless tobacco: Never Used  Substance Use Topics  . Alcohol use: Yes    Alcohol/week: 4.2 - 8.4 oz    Types: 7 - 14 Glasses of wine per week    Comment: 1-2 glasses of wine nightly  . Drug use: No     Allergies   Methotrexate; Lisinopril; Atorvastatin; and Pravastatin sodium   Review of Systems Review of Systems  Constitutional: Negative for fever.  Respiratory: Negative for shortness of breath.   Cardiovascular: Positive for palpitations (resolved). Negative for chest pain and leg swelling.  Gastrointestinal: Negative for abdominal pain, blood in stool, nausea and vomiting.  Genitourinary: Negative for hematuria.  Allergic/Immunologic: Positive for immunocompromised state.  Neurological: Positive for weakness (resolved) and light-headedness (resolved). Negative for syncope.  Hematological: Bruises/bleeds easily.   All other systems reviewed and are negative.    Physical Exam Updated Vital Signs BP (!) 144/87   Pulse 81   Temp 98.2 F (36.8 C) (Oral)   Resp 12   SpO2 98%   Physical  Exam  Constitutional: He is oriented to person, place, and time. He appears well-developed and well-nourished. No distress.  Elderly male in no acute distress.  Calm and pleasant  HENT:  Head: Normocephalic and atraumatic.  Eyes: Pupils are equal, round, and reactive to light. Conjunctivae are normal. Right eye exhibits no discharge. Left eye exhibits no discharge. No scleral icterus.  Neck: Normal range of motion.  Cardiovascular: Normal rate and regular rhythm.  Pulmonary/Chest: Effort normal and breath sounds normal. No respiratory distress.  Abdominal: Soft. Bowel sounds are normal. He exhibits no distension. There is no tenderness.  Neurological: He is alert and oriented to person, place, and time.  Ambulatory  Skin: Skin is warm and dry.  Several areas of bruising on the legs and scabs  Psychiatric: He has a normal mood and affect. His behavior is normal.  Nursing note and vitals reviewed.    ED Treatments / Results  Labs (all labs ordered are listed, but only abnormal results are displayed) Labs Reviewed  BASIC METABOLIC PANEL - Abnormal; Notable for the following components:      Result Value   Glucose, Bld 150 (*)    All other components within normal limits  CBC - Abnormal; Notable for the following components:   RBC 4.07 (*)    All other components within normal limits  I-STAT TROPONIN, ED  I-STAT TROPONIN, ED    EKG EKG Interpretation  Date/Time:  Saturday Apr 01 2018 11:13:00 EDT Ventricular Rate:  82 PR Interval:    QRS Duration: 104 QT Interval:  388 QTC Calculation: 678 R Axis:   1 Text Interpretation:  Sinus rhythm Abnormal R-wave progression, early transition Abnormal inferior Q waves No significant change since last tracing Confirmed by Duffy Bruce [phone]) on 04/01/2018  12:07:19 PM   Radiology Ct Angio Chest Pe W/cm &/or Wo Cm  Result Date: 04/01/2018 CLINICAL DATA:  Syncope, palpitations EXAM: CT ANGIOGRAPHY CHEST WITH CONTRAST TECHNIQUE: Multidetector CT imaging of the chest was performed using the standard protocol during bolus administration of intravenous contrast. Multiplanar CT image reconstructions and MIPs were obtained to evaluate the vascular anatomy. CONTRAST:  110mL ISOVUE-370 IOPAMIDOL (ISOVUE-370) INJECTION 76% COMPARISON:  None. FINDINGS: Cardiovascular: No filling defects in the pulmonary arteries to suggest pulmonary emboli. Diffuse coronary artery calcifications throughout all 3 major coronary vessels and the branches. Moderate aortic calcifications. No aneurysm or dissection. Mediastinum/Nodes: No mediastinal, hilar, or axillary adenopathy. Trachea and esophagus are unremarkable. Lungs/Pleura: Subpleural nodule anteriorly in the right middle lobe measures 5 mm on image 79 no confluent airspace opacities. Dependent atelectasis. No effusions. Upper Abdomen: Imaging into the upper abdomen shows no acute findings. Musculoskeletal: Chest wall soft tissues are unremarkable. No acute bony abnormality. Review of the MIP images confirms the above findings. IMPRESSION: No evidence of pulmonary embolus. Diffuse severe coronary artery disease.  Aortic atherosclerosis. 5 mm right middle lobe subpleural nodule. No follow-up needed if patient is low-risk. Non-contrast chest CT can be considered in 12 months if patient is high-risk. This recommendation follows the consensus statement: Guidelines for Management of Incidental Pulmonary Nodules Detected on CT Images: From the Fleischner Society 2017; Radiology 2017; 284:228-243. Electronically Signed   By: Rolm Baptise M.D.   On: 04/01/2018 16:03    Procedures Procedures (including critical care time)  Medications Ordered in ED Medications  iopamidol (ISOVUE-370) 76 % injection (has no administration in time range)    metoprolol tartrate (LOPRESSOR) tablet 25 mg (25 mg Oral Given 04/01/18 1301)  iopamidol (ISOVUE-370) 76 %  injection 100 mL (100 mLs Intravenous Contrast Given 04/01/18 1501)     Initial Impression / Assessment and Plan / ED Course  I have reviewed the triage vital signs and the nursing notes.  Pertinent labs & imaging results that were available during my care of the patient were reviewed by me and considered in my medical decision making (see chart for details).  84 year old male presents with near syncope and palpitations in the setting of taking a recent trip and going on a benefit walk this morning.  He is hypertensive but otherwise vital signs are normal. Orthostatics are remarkable for elevation of HR with standing. BP remained stable.   Orthostatic VS for the past 24 hrs:  BP- Lying Pulse- Lying BP- Sitting Pulse- Sitting BP- Standing at 0 minutes Pulse- Standing at 0 minutes  04/01/18 1326 166/79 69 166/79 87 (!) 156/92 92     He is very well-appearing on exam and is currently asymptomatic.  Heart is regular rate and rhythm.  Lungs are clear to auscultation.  Abdomen is nontender.  There is no calf swelling.  His CBC is normal.  His BMP is remarkable for mild type hyperglycemia.  EKG is normal sinus rhythm and troponin is normal.  This is a shared visit with Dr. Ellender Hose.  Discussed with Dr. Stanford Breed with cardiology who recommends that to ensure patient does not have a PE in the setting of near syncope and recent travel.  Will obtain CTA of chest and delta troponin.  4:21 PM CTA is negative for PE. 2nd trop is .05. Advised f/u with Dr. Rayann Heman.  Final Clinical Impressions(s) / ED Diagnoses   Final diagnoses:  Near syncope  Palpitations    ED Discharge Orders    None       Iris Pert 04/01/18 1632    Duffy Bruce, MD 04/02/18 [phone]

## 2018-04-01 NOTE — ED Notes (Signed)
Transported to CT 

## 2018-04-01 NOTE — ED Triage Notes (Signed)
Per EMS: Pt at a benefit walk [facility]) with c/o of near syncopal episode.  Pt states he "did not feel right", was pale and diaphoretic.  Pt recently returned from a trip and states he may nat have been well hydrated.  Pt has a hx of cardiac cath with stent placement and MI 4-5 years ago. CBG 122

## 2018-04-01 NOTE — Discharge Instructions (Signed)
Please drink plenty of fluids Follow up with Dr. Rayann Heman Return if worsening

## 2018-04-01 NOTE — ED Notes (Signed)
Pt ambulated to restroom. 

## 2018-04-03 DIAGNOSIS — M0609 Rheumatoid arthritis without rheumatoid factor, multiple sites: Secondary | ICD-10-CM | POA: Diagnosis not present

## 2018-04-11 ENCOUNTER — Other Ambulatory Visit: Payer: Self-pay | Admitting: Internal Medicine

## 2018-04-11 NOTE — Telephone Encounter (Signed)
Please advise about refill in Dr. Enis Slipper absence. Checked controlled substance database, last filled 12/14/17.

## 2018-05-01 DIAGNOSIS — M0609 Rheumatoid arthritis without rheumatoid factor, multiple sites: Secondary | ICD-10-CM | POA: Diagnosis not present

## 2018-05-01 DIAGNOSIS — Z79899 Other long term (current) drug therapy: Secondary | ICD-10-CM | POA: Diagnosis not present

## 2018-05-17 ENCOUNTER — Other Ambulatory Visit: Payer: Self-pay | Admitting: Internal Medicine

## 2018-05-24 DIAGNOSIS — Z6825 Body mass index (BMI) 25.0-25.9, adult: Secondary | ICD-10-CM | POA: Diagnosis not present

## 2018-05-24 DIAGNOSIS — M0609 Rheumatoid arthritis without rheumatoid factor, multiple sites: Secondary | ICD-10-CM | POA: Diagnosis not present

## 2018-05-24 DIAGNOSIS — E663 Overweight: Secondary | ICD-10-CM | POA: Diagnosis not present

## 2018-05-24 DIAGNOSIS — M5030 Other cervical disc degeneration, unspecified cervical region: Secondary | ICD-10-CM | POA: Diagnosis not present

## 2018-05-30 DIAGNOSIS — M4712 Other spondylosis with myelopathy, cervical region: Secondary | ICD-10-CM | POA: Diagnosis not present

## 2018-05-30 DIAGNOSIS — M069 Rheumatoid arthritis, unspecified: Secondary | ICD-10-CM | POA: Diagnosis not present

## 2018-05-31 DIAGNOSIS — M0609 Rheumatoid arthritis without rheumatoid factor, multiple sites: Secondary | ICD-10-CM | POA: Diagnosis not present

## 2018-06-01 DIAGNOSIS — D1801 Hemangioma of skin and subcutaneous tissue: Secondary | ICD-10-CM | POA: Diagnosis not present

## 2018-06-01 DIAGNOSIS — C44622 Squamous cell carcinoma of skin of right upper limb, including shoulder: Secondary | ICD-10-CM | POA: Diagnosis not present

## 2018-06-01 DIAGNOSIS — D692 Other nonthrombocytopenic purpura: Secondary | ICD-10-CM | POA: Diagnosis not present

## 2018-06-01 DIAGNOSIS — L812 Freckles: Secondary | ICD-10-CM | POA: Diagnosis not present

## 2018-06-01 DIAGNOSIS — D485 Neoplasm of uncertain behavior of skin: Secondary | ICD-10-CM | POA: Diagnosis not present

## 2018-06-01 DIAGNOSIS — L821 Other seborrheic keratosis: Secondary | ICD-10-CM | POA: Diagnosis not present

## 2018-06-01 DIAGNOSIS — L570 Actinic keratosis: Secondary | ICD-10-CM | POA: Diagnosis not present

## 2018-06-01 DIAGNOSIS — Z85828 Personal history of other malignant neoplasm of skin: Secondary | ICD-10-CM | POA: Diagnosis not present

## 2018-06-24 ENCOUNTER — Other Ambulatory Visit: Payer: Self-pay | Admitting: Internal Medicine

## 2018-06-24 MED ORDER — OXYCODONE-ACETAMINOPHEN 5-325 MG PO TABS: ORAL_TABLET | ORAL | 0 refills | Status: DC

## 2018-06-28 DIAGNOSIS — M0609 Rheumatoid arthritis without rheumatoid factor, multiple sites: Secondary | ICD-10-CM | POA: Diagnosis not present

## 2018-07-27 DIAGNOSIS — M0609 Rheumatoid arthritis without rheumatoid factor, multiple sites: Secondary | ICD-10-CM | POA: Diagnosis not present

## 2018-07-28 DIAGNOSIS — Z6825 Body mass index (BMI) 25.0-25.9, adult: Secondary | ICD-10-CM | POA: Diagnosis not present

## 2018-07-28 DIAGNOSIS — E663 Overweight: Secondary | ICD-10-CM | POA: Diagnosis not present

## 2018-07-28 DIAGNOSIS — M5030 Other cervical disc degeneration, unspecified cervical region: Secondary | ICD-10-CM | POA: Diagnosis not present

## 2018-07-28 DIAGNOSIS — M0609 Rheumatoid arthritis without rheumatoid factor, multiple sites: Secondary | ICD-10-CM | POA: Diagnosis not present

## 2018-08-09 ENCOUNTER — Other Ambulatory Visit: Payer: Self-pay | Admitting: Internal Medicine

## 2018-08-14 ENCOUNTER — Ambulatory Visit (INDEPENDENT_AMBULATORY_CARE_PROVIDER_SITE_OTHER): Payer: Medicare Other | Admitting: Emergency Medicine

## 2018-08-14 DIAGNOSIS — Z23 Encounter for immunization: Secondary | ICD-10-CM | POA: Diagnosis not present

## 2018-08-19 ENCOUNTER — Other Ambulatory Visit: Payer: Self-pay | Admitting: Internal Medicine

## 2018-08-19 ENCOUNTER — Other Ambulatory Visit: Payer: Self-pay | Admitting: Cardiology

## 2018-08-21 NOTE — Telephone Encounter (Signed)
Pt last saw Dr Rayann Heman 12/12/17, last labs 04/01/18 Creat 1.08, age 84, weight 78.9kg, based on specified criteria pt is on appropriate dosage of Eliquis 5mg  BID.  Will refill rx.

## 2018-08-24 DIAGNOSIS — M0609 Rheumatoid arthritis without rheumatoid factor, multiple sites: Secondary | ICD-10-CM | POA: Diagnosis not present

## 2018-08-29 DIAGNOSIS — M4302 Spondylolysis, cervical region: Secondary | ICD-10-CM | POA: Diagnosis not present

## 2018-08-29 DIAGNOSIS — M069 Rheumatoid arthritis, unspecified: Secondary | ICD-10-CM | POA: Diagnosis not present

## 2018-08-30 NOTE — Telephone Encounter (Signed)
error 

## 2018-09-13 ENCOUNTER — Other Ambulatory Visit: Payer: Self-pay | Admitting: Internal Medicine

## 2018-09-21 DIAGNOSIS — M0609 Rheumatoid arthritis without rheumatoid factor, multiple sites: Secondary | ICD-10-CM | POA: Diagnosis not present

## 2018-09-27 DIAGNOSIS — M5030 Other cervical disc degeneration, unspecified cervical region: Secondary | ICD-10-CM | POA: Diagnosis not present

## 2018-09-27 DIAGNOSIS — Z6825 Body mass index (BMI) 25.0-25.9, adult: Secondary | ICD-10-CM | POA: Diagnosis not present

## 2018-09-27 DIAGNOSIS — M0609 Rheumatoid arthritis without rheumatoid factor, multiple sites: Secondary | ICD-10-CM | POA: Diagnosis not present

## 2018-10-03 ENCOUNTER — Encounter: Payer: Self-pay | Admitting: Physician Assistant

## 2018-10-03 ENCOUNTER — Other Ambulatory Visit: Payer: Self-pay | Admitting: Internal Medicine

## 2018-10-03 NOTE — Progress Notes (Signed)
Cardiology Office Note    Date:  10/05/2018  ID:  MOROCCO GIPE, DOB May 21, 1933, MRN [id number] PCP:  Cassandria Anger, MD  Cardiologist:  Fransico Him, MD, EP - Dr. Rayann Heman   Chief Complaint: routine follow-up of CAD and atrial fib  History of Present Illness:  [name] is a 84 y.o. male with history of CAD (inferior STEMI 12/2013 with total RCA occlusion s/p DESx2, residual 60-70% mLAD), paroxysmal atrial fib (dx 02/2015), PACs, OSA on CPAP followed by Dr. Radford Pax, GERD, HTN, HLD (prior intolerances to other statins), migraine, OA, rheumatoid arthritis who presents for f/u. He is followed by both Dr. Rayann Heman as well as his primary cardiologist and Dr. Radford Pax for sleep apnea. He did see Orson Eva in 2016 who referenced that AF was diagnosed in 2016. Nuclear stress test 11/2014 showed small diaphragmatic attenuation artifact, otherwise normal/low risk. In more recent years his rhythm has been sinus brady, sinus rhythm and sinus tach. There does not appear to have been documented recurrence since that time, although he did have an ER visit earlier this year having reported palpitations in the setting of possible dehydration and missing meds. He was in NSR at the time of that visit. Last labs 03/2018 with Hgb 13.4, K 4.1, Cr 1.08; 11/2017 TSH wnl, AST/ALT OK, 02/2017 LDL 78. Last echo 02/2016 showed mild LVH, EF 50-55%, grade 1 DD.  He was put on my schedule as overdue follow-up with Dr. Radford Pax, having last seen her 05/2017. I called him the other day as I do not manage OSA and he still elected to come in for general cardiology follow-up. He is feeling great. No CP, SOB, palpitations, bleeding. He does not always think to put his CPAP on before getting in bed to read, and promises to improve compliance.   Past Medical History:  Diagnosis Date  . CAD (coronary artery disease)    a. s/p inferior STEMI 01/08/2014; LHC 01/08/14: total RCA occlusion s/p 3.5x73mm Xience DES distal RCA and 3.5x28 mm DES  mid RCA, 60-70% mid LAD stenosis, EF 55%.  . Complication of anesthesia    'long to wake up after back surgery " 09/2003  . Diverticulosis of colon   . GERD (gastroesophageal reflux disease)   . History of cellulitis    05-26-2015  LLE  . History of colon polyps    1998- benign/  2008 adenomatous   . History of kidney stones    2013  . History of squamous cell carcinoma excision    2013;  2015;  06-12-2015 right leg/  02/ and 05/ 2017  left ear and left leg  . Hydronephrosis, left   . Hyperlipidemia   . Hypertension   . Migraine with aura   . OSA on CPAP 06/19/2015   Moderate OSA with AHI 18/hr  per study 05-20-2015  . Osteoarthritis   . Palpitations   . Paroxysmal atrial fibrillation ([facility]) 4/16   chads2vasc score is at least 4  . Premature atrial contractions   . RA (rheumatoid arthritis) [facility])    rheumatologist-  dr Leigh Aurora  . S/P drug eluting coronary stent placement 01/08/2014   x2  to mid and distal RCA  . Sinusitis, chronic 10/17/2015  . Wears glasses   . Wears hearing aid    bilateral    Past Surgical History:  Procedure Laterality Date  . BACK SURGERY    . CARDIOVASCULAR STRESS TEST  11/21/2015   Low risk nuclear study w/ a small  diaphragmatic attenuation artifact, no ischemia/  normal LV function and wall motion , ef 63%  . CATARACT EXTRACTION W/ INTRAOCULAR LENS  IMPLANT, BILATERAL  2015  . COLONOSCOPY  last one 06-09-2011  . CORONARY ANGIOPLASTY    . CYSTOSCOPY W/ RETROGRADES Left 07/12/2016   Procedure: CYSTOSCOPY WITH RETROGRADE PYELOGRAM LEFT URETERAL STENT;  Surgeon: Irine Seal, MD;  Location: [facility];  Service: Urology;  Laterality: Left;  . CYSTOSCOPY WITH RETROGRADE PYELOGRAM, URETEROSCOPY AND STENT PLACEMENT Left 08/11/2016   Procedure: CYSTOSCOPY WITH RETROGRADE PYELOGRAM,  DIAGNOSTIC URETEROSCOPY , STENT EXCHANGE;  Surgeon: Alexis Frock, MD;  Location: [facility];  Service: Urology;  Laterality: Left;  . DUPUYTREN CONTRACTURE  RELEASE Right 09/30/2009   severe fibromatosis palm and fingers  . LEFT HEART CATHETERIZATION WITH CORONARY ANGIOGRAM N/A 01/08/2014   Procedure: LEFT HEART CATHETERIZATION WITH CORONARY ANGIOGRAM;  Surgeon: Troy Sine, MD;  Location: [facility] C[facility] LAB;  Service: Cardiovascular;  Laterality:N/A;  total/ subtotal RCA/  mLAD 02-58% w/ mid systolic bridging/  preserved global LVF, ef 55%  . MOHS SURGERY  x2  feb and may 2017   left ear /  left leg  ([facility])  . PERCUTANEOUS CORONARY STENT INTERVENTION (PCI-S)  01/08/2014   Procedure: PERCUTANEOUS CORONARY STENT INTERVENTION (PCI-S);  Surgeon: Troy Sine, MD;  Location[facility] C[facility] LAB;  Service: Cardiovascular;;  DES to mid and distal RCA  . POSTERIOR LUMBAR FUSION  10/01/2003   and Laminectomy/ diskectomy  L4 -- S1  . ROBOT ASSISTED PYELOPLASTY Left 09/15/2016   Procedure: XI ROBOTIC ASSISTED PYELOPLASTY;  Surgeon: Alexis Frock, MD;  Location: [facility];  Service: Urology;  Laterality: Left;  . TONSILLECTOMY    . TRANSTHORACIC ECHOCARDIOGRAM  02/23/2016   mild LVH,  ef 50-55%,  grade 1 diastolic dysfunction/  mild to moderate AV calcification w/ no stenosis or regurg./  trivial MR and TR/  mild PR    Current Medications: Current Meds  Medication Sig  . Abatacept (ORENCIA [place]) Inject 1 Applicatorful into the skin See admin instructions. Every 4 weeks Next injection due 09-29-16  . acetaminophen (TYLENOL) 500 MG tablet Take 1,000 mg by mouth every 6 (six) hours as needed.  [name] aspirin EC 81 MG tablet Take 81 mg by mouth daily.  . Cholecalciferol (VITAMIN D PO) Take 1 tablet by mouth daily.  . Cyanocobalamin (B-12) 2500 MCG TABS Take 1 tablet by mouth daily.  . DULoxetine (CYMBALTA) 60 MG capsule TAKE ONE CAPSULE EVERY MORNING  . ELIQUIS 5 MG TABS tablet TAKE ONE TABLET TWICE DAILY  . ezetimibe (ZETIA) 10 MG tablet TAKE ONE-HALF TABLET DAILY  . fexofenadine (ALLEGRA) 180 MG tablet Take 180 mg by mouth daily as needed for allergies or rhinitis.   [name]  lisinopril (PRINIVIL,ZESTRIL) 5 MG tablet Take 1 tablet (5 mg total) by mouth daily.  [name] LORazepam (ATIVAN) 0.5 MG tablet TAKE ONE OR TWO TABLETS TWICE DAILY AS NEEDED FOR ANXIETY  . metoprolol tartrate (LOPRESSOR) 25 MG tablet Take 1.5 tablets (37.5 mg total) by mouth 2 (two) times daily. Please make yearly appt with Dr. Rayann Heman for January. Thanks  . nitroGLYCERIN (NITROSTAT) 0.4 MG SL tablet ONE TABLET UNDER TONGUE AS NEEDED FOR CHEST PAIN. MAY REPEAT IN 5 MINUTES AND AGAIN IN 5 MINUTES (TOTAL OF 3 TABLETS)  . omeprazole (PRILOSEC) 40 MG capsule TAKE ONE CAPSULE EACH DAY  . ondansetron (ZOFRAN) 4 MG tablet Take 1 tablet (4 mg total) by mouth every 8 (eight) hours as needed for nausea  or vomiting.  [name] oxyCODONE-acetaminophen (PERCOCET/ROXICET) 5-325 MG tablet TAKE ONE OR TWO TABLETS EVERY EIGHT HOURS AS NEEDED FOR MODERATE PAINOR SEVERE PAIN  . predniSONE (DELTASONE) 5 MG tablet Take 10 mg by mouth every morning.   [name] Propylene Glycol (SYSTANE BALANCE OP) Place 2 drops into both eyes daily as needed (dry eyes).   . rosuvastatin (CRESTOR) 10 MG tablet TAKE ONE TABLET EACH DAY  . zolpidem (AMBIEN) 10 MG tablet TAKE 1/2 TO 1 TABLET AT BEDTIME AS NEEDED FOR SLEEP      Allergies:   Methotrexate; Lisinopril; Atorvastatin; and Pravastatin sodium   Social History   Socioeconomic History  . Marital status: Married    Spouse name: Not on file  . Number of children: 2  . Years of education: Not on file  . Highest education level: Not on file  Occupational History  . Occupation: Retired    Fish farm manager: RETIRED  Social Needs  . Financial resource strain: Not hard at all  . Food insecurity:    Worry: Never true    Inability: Never true  . Transportation needs:    Medical: No    Non-medical: No  Tobacco Use  . Smoking status: Former Smoker    Years: 10.00    Types: Cigarettes    Last attempt to quit: 08/09/1968    Years since quitting: 50.1  . Smokeless tobacco: Never Used  Substance and Sexual  Activity  . Alcohol use: Yes    Alcohol/week: 7.0 - 14.0 standard drinks    Types: 7 - 14 Glasses of wine per week    Comment: 1-2 glasses of wine nightly  . Drug use: No  . Sexual activity: Yes  Lifestyle  . Physical activity:    Days per week: 4 days    Minutes per session: 60 min  . Stress: Only a little  Relationships  . Social connections:    Talks on phone: More than three times a week    Gets together: More than three times a week    Attends religious service: More than 4 times per year    Active member of club or organization: Not on file    Attends meetings of clubs or organizations: More than 4 times per year    Relationship status: Married  Other Topics Concern  . Not on file  Social History Narrative   1 Caffeine drink daily      Family History:  The patient's family history includes Heart disease in his father; Hypertension in his mother. There is no history of Colon cancer.  ROS:   Please see the history of present illness.  All other systems are reviewed and otherwise negative.    PHYSICAL EXAM:   VS:  BP 132/78   Pulse 67   Ht 6' (1.829 m)   Wt 183 lb (83 kg)   SpO2 96%   BMI 24.82 kg/m   BMI: Body mass index is 24.82 kg/m. GEN: Well nourished, well developed WM, in no acute distress HEENT: normocephalic, atraumatic Neck: no JVD, carotid bruits, or masses Cardiac: RRR; no murmurs, rubs, or gallops, no edema  Respiratory:  clear to auscultation bilaterally, normal work of breathing GI: soft, nontender, nondistended, + BS MS: no deformity or atrophy Skin: warm and dry, no rash Neuro:  Alert and Oriented x 3, Strength and sensation are intact, follows commands Psych: euthymic mood, full affect  Wt Readings from Last 3 Encounters:  10/05/18 183 lb (83 kg)  02/13/18 174 lb (78.9  kg)  02/07/18 176 lb 0.6 oz (79.9 kg)      Studies/Labs Reviewed:   EKG:  EKG was ordered today and personally reviewed by me and demonstrates NSR 67bpm QTc 424ms, no  acute changes  Recent Labs: 12/13/2017: TSH 1.18 02/07/2018: ALT 19 04/01/2018: BUN 16; Creatinine, Ser 1.08; Hemoglobin 13.4; Platelets 280; Potassium 4.1; Sodium 141   Lipid Panel    Component Value Date/Time   CHOL 164 02/21/2017 1349   TRIG 173.0 (H) 02/21/2017 1349   HDL 52.20 02/21/2017 1349   CHOLHDL 3 02/21/2017 1349   VLDL 34.6 02/21/2017 1349   LDLCALC 78 02/21/2017 1349   LDLDIRECT 168.9 05/24/2013 0732    Additional studies/ records that were reviewed today include: Summarized above.   ASSESSMENT & PLAN:   1. CAD - doing well. Continue present regimen. Check CBC to ensure still safe to continue ASA in addition to Eliquis. This was not on med list last visit but was on the visit prior. Patient indicates he had never stopped it since his MI in 68. Denies any bleeding. Continue BB, statin. 2. Paroxysmal atrial fibrillation - maintaining NSR. Continue Eliquis. BMET/CBC today. 3. HTN - acceptable control given his age/comorbidities. 4. Hyperlipidemia - continue statin. He ate today already so will not add to labs today. This is followed by PCP and patient reports he has f/u there in the early part of the year. 5. OSA - he will try to be more compliant. Will arrange overdue f/u with Dr. Radford Pax.  Disposition: He has f/u scheduled with Dr. Rayann Heman in 12/2018. Will push that out to 03/2019 (6 months) since he was seen in this visit today. He is agreeable with this plan.   Medication Adjustments/Labs and Tests Ordered: Current medicines are reviewed at length with the patient today.  Concerns regarding medicines are outlined above. Medication changes, Labs and Tests ordered today are summarized above and listed in the Patient Instructions accessible in Encounters.   Signed, Charlie Pitter, PA-C  10/05/2018 9:58 AM    [facility] [address], [place], [place]  [address] Phone: [phone]; Fax: [phone]

## 2018-10-05 ENCOUNTER — Ambulatory Visit (INDEPENDENT_AMBULATORY_CARE_PROVIDER_SITE_OTHER): Payer: Medicare Other | Admitting: Physician Assistant

## 2018-10-05 ENCOUNTER — Encounter: Payer: Self-pay | Admitting: Physician Assistant

## 2018-10-05 VITALS — BP 132/78 | HR 67 | Ht 72.0 in | Wt 183.0 lb

## 2018-10-05 DIAGNOSIS — I2510 Atherosclerotic heart disease of native coronary artery without angina pectoris: Secondary | ICD-10-CM | POA: Diagnosis not present

## 2018-10-05 DIAGNOSIS — Z9989 Dependence on other enabling machines and devices: Secondary | ICD-10-CM

## 2018-10-05 DIAGNOSIS — E785 Hyperlipidemia, unspecified: Secondary | ICD-10-CM

## 2018-10-05 DIAGNOSIS — I10 Essential (primary) hypertension: Secondary | ICD-10-CM

## 2018-10-05 DIAGNOSIS — G4733 Obstructive sleep apnea (adult) (pediatric): Secondary | ICD-10-CM

## 2018-10-05 DIAGNOSIS — I480 Paroxysmal atrial fibrillation: Secondary | ICD-10-CM | POA: Diagnosis not present

## 2018-10-05 LAB — CBC
Hematocrit: 41.2 % (ref 37.5–51.0)
Hemoglobin: 14.1 g/dL (ref 13.0–17.7)
MCH: 32.3 pg (ref 26.6–33.0)
MCHC: 34.2 g/dL (ref 31.5–35.7)
MCV: 95 fL (ref 79–97)
Platelets: 290 10*3/uL (ref 150–450)
RBC: 4.36 x10E6/uL (ref 4.14–5.80)
RDW: 13.2 % (ref 12.3–15.4)
WBC: 5.1 10*3/uL (ref 3.4–10.8)

## 2018-10-05 LAB — BASIC METABOLIC PANEL
BUN/Creatinine Ratio: 20 (ref 10–24)
BUN: 17 mg/dL (ref 8–27)
CO2: 23 mmol/L (ref 20–29)
Calcium: 10.2 mg/dL (ref 8.6–10.2)
Chloride: 101 mmol/L (ref 96–106)
Creatinine, Ser: 0.87 mg/dL (ref 0.76–1.27)
GFR calc Af Amer: 92 mL/min/{1.73_m2} (ref >59)
GFR calc non Af Amer: 80 mL/min/{1.73_m2} (ref >59)
Glucose: 95 mg/dL (ref 65–99)
Potassium: 4.4 mmol/L (ref 3.5–5.2)
Sodium: 141 mmol/L (ref 134–144)

## 2018-10-05 NOTE — Patient Instructions (Signed)
Medication Instructions:  1. Your physician recommends that you continue on your current medications as directed. Please refer to the Current Medication list given to you today.  If you need a refill on your cardiac medications before your next appointment, please call your pharmacy.   Lab work: TODAY BMET, CBC If you have labs (blood work) drawn today and your tests are completely normal, you will receive your results only by: [name] MyChart Message (if you have MyChart) OR . A paper copy in the mail If you have any lab test that is abnormal or we need to change your treatment, we will call you to review the results.  Testing/Procedures: NONE ORDERED TODAY   Follow-Up: At [facility], you and your health needs are our priority.  As part of our continuing mission to provide you with exceptional heart care, we have created designated Provider Care Teams.  These Care Teams include your primary Cardiologist (physician) and Advanced Practice Providers (APPs -  Physician Assistants and Nurse Practitioners) who all work together to provide you with the care you need, when you need it. . FOLLOW UP WITH DR. Radford Pax IN THE NEXT 3 MONTHS FOR SLEEP APNEA    . FOLLOW UP WITH DR. ALLRED IN MAY 2020    Any Other Special Instructions Will Be Listed Below (If Applicable).

## 2018-10-10 ENCOUNTER — Ambulatory Visit: Payer: Medicare Other | Admitting: Physician Assistant

## 2018-10-11 ENCOUNTER — Encounter: Payer: Self-pay | Admitting: Cardiology

## 2018-10-11 ENCOUNTER — Ambulatory Visit (INDEPENDENT_AMBULATORY_CARE_PROVIDER_SITE_OTHER): Payer: Medicare Other | Admitting: Cardiology

## 2018-10-11 VITALS — BP 124/76 | HR 66 | Ht 72.0 in | Wt 188.8 lb

## 2018-10-11 DIAGNOSIS — I10 Essential (primary) hypertension: Secondary | ICD-10-CM | POA: Diagnosis not present

## 2018-10-11 DIAGNOSIS — I2510 Atherosclerotic heart disease of native coronary artery without angina pectoris: Secondary | ICD-10-CM | POA: Diagnosis not present

## 2018-10-11 DIAGNOSIS — E782 Mixed hyperlipidemia: Secondary | ICD-10-CM

## 2018-10-11 DIAGNOSIS — I480 Paroxysmal atrial fibrillation: Secondary | ICD-10-CM

## 2018-10-11 DIAGNOSIS — G4733 Obstructive sleep apnea (adult) (pediatric): Secondary | ICD-10-CM

## 2018-10-11 NOTE — Patient Instructions (Signed)
Medication Instructions:  Your physician recommends that you continue on your current medications as directed. Please refer to the Current Medication list given to you today.  If you need a refill on your cardiac medications before your next appointment, please call your pharmacy.   Lab work: Future: 12/5, BMET, LFT and Lipid, fasting lab  If you have labs (blood work) drawn today and your tests are completely normal, you will receive your results only by: [name] MyChart Message (if you have MyChart) OR . A paper copy in the mail If you have any lab test that is abnormal or we need to change your treatment, we will call you to review the results.  Follow-Up: At [facility], you and your health needs are our priority.  As part of our continuing mission to provide you with exceptional heart care, we have created designated Provider Care Teams.  These Care Teams include your primary Cardiologist (physician) and Advanced Practice Providers (APPs -  Physician Assistants and Nurse Practitioners) who all work together to provide you with the care you need, when you need it. You will need a follow up appointment in 1 years.  Please call our office 2 months in advance to schedule this appointment.  You may see Fransico Him, MD or one of the following Advanced Practice Providers on your designated Care Team:   [place], PA-C Melina Copa, PA-C . Ermalinda Barrios, PA-C

## 2018-10-11 NOTE — Progress Notes (Signed)
Cardiology Office Note:    Date:  10/11/2018   ID:  [name], DOB June 20, 1933, MRN [id number]  PCP:  Cassandria Anger, MD  Cardiologist:  Fransico Him, MD    Referring MD: Cassandria Anger, MD   Chief Complaint  Patient presents with  . Sleep Apnea  . Hypertension    History of Present Illness:    [name] is a 84 y.o. male with a hx of CAD (inferior STEMI 12/2013 with total RCA occlusion s/p DESx2, residual 60-70% mLAD), paroxysmal atrial fib (dx 02/2015), PACs, OSA on CPAP, GERD, HTN, HLD (prior intolerances to other statins).  He has moderate OSA with an AHI of 18/hr. He is now on 8cm H2O.   He is doing well with his CPAP device and thinks that he has gotten used to it.  He tolerates the mask and feels the pressure is adequate.  Since going on CPAP he feels rested in the am and has no significant daytime sleepiness.  He denies any significant mouth or nasal dryness or nasal congestion.  He does not think that he snores.  He is here today for followup and is doing well.  He denies any chest pain or pressure, SOB, DOE, PND, orthopnea, LE edema, dizziness, palpitations or syncope. He is compliant with his meds and is tolerating meds with no SE.      Past Medical History:  Diagnosis Date  . CAD (coronary artery disease)    a. s/p inferior STEMI 01/08/2014; LHC 01/08/14: total RCA occlusion s/p 3.5x44mm Xience DES distal RCA and 3.5x28 mm DES mid RCA, 60-70% mid LAD stenosis, EF 55%.  . Complication of anesthesia    'long to wake up after back surgery " 09/2003  . Diverticulosis of colon   . GERD (gastroesophageal reflux disease)   . History of cellulitis    05-26-2015  LLE  . History of colon polyps    1998- benign/  2008 adenomatous   . History of kidney stones    2013  . History of squamous cell carcinoma excision    2013;  2015;  06-12-2015 right leg/  02/ and 05/ 2017  left ear and left leg  . Hydronephrosis, left   . Hyperlipidemia   . Hypertension   .  Migraine with aura   . OSA on CPAP 06/19/2015   Moderate OSA with AHI 18/hr  per study 05-20-2015  . Osteoarthritis   . Palpitations   . Paroxysmal atrial fibrillation ([place]) 4/16   chads2vasc score is at least 4  . Premature atrial contractions   . RA (rheumatoid arthritis) [facility])    rheumatologist-  dr Leigh Aurora  . S/P drug eluting coronary stent placement 01/08/2014   x2  to mid and distal RCA  . Sinusitis, chronic 10/17/2015  . Wears glasses   . Wears hearing aid    bilateral    Past Surgical History:  Procedure Laterality Date  . BACK SURGERY    . CARDIOVASCULAR STRESS TEST  11/21/2015   Low risk nuclear study w/ a small diaphragmatic attenuation artifact, no ischemia/  normal LV function and wall motion , ef 63%  . CATARACT EXTRACTION W/ INTRAOCULAR LENS  IMPLANT, BILATERAL  2015  . COLONOSCOPY  last one 06-09-2011  . CORONARY ANGIOPLASTY    . CYSTOSCOPY W/ RETROGRADES Left 07/12/2016   Procedure: CYSTOSCOPY WITH RETROGRADE PYELOGRAM LEFT URETERAL STENT;  Surgeon: Irine Seal, MD;  Location: [facility];  Service: Urology;  Laterality: Left;  .  CYSTOSCOPY WITH RETROGRADE PYELOGRAM, URETEROSCOPY AND STENT PLACEMENT Left 08/11/2016   Procedure: CYSTOSCOPY WITH RETROGRADE PYELOGRAM,  DIAGNOSTIC URETEROSCOPY , STENT EXCHANGE;  Surgeon: Alexis Frock, MD;  Location: [facility];  Service: Urology;  Laterality: Left;  . 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   severe fibromatosis palm and fingers  . LEFT HEART CATHETERIZATION WITH CORONARY ANGIOGRAM N/A 01/08/2014   Procedure: LEFT HEART CATHETERIZATION WITH CORONARY ANGIOGRAM;  Surgeon: Troy Sine, MD;  Location: [facility] CATH LAB;  Service: Cardiovascular;  Laterality:N/A;  total/ subtotal RCA/  mLAD 72-62% w/ mid systolic bridging/  preserved global LVF, ef 55%  . MOHS SURGERY  x2  feb and may 2017   left ear /  left leg  ([facility])  . PERCUTANEOUS CORONARY STENT INTERVENTION (PCI-S)  01/08/2014   Procedure: PERCUTANEOUS  CORONARY STENT INTERVENTION (PCI-S);  Surgeon: Troy Sine, MD;  Location: [facility] CATH LAB;  Service: Cardiovascular;;  DES to mid and distal RCA  . POSTERIOR LUMBAR FUSION  10/01/2003   and Laminectomy/ diskectomy  L4 -- S1  . ROBOT ASSISTED PYELOPLASTY Left 09/15/2016   Procedure: XI ROBOTIC ASSISTED PYELOPLASTY;  Surgeon: Alexis Frock, MD;  Location: [facility];  Service: Urology;  Laterality: Left;  . TONSILLECTOMY    . TRANSTHORACIC ECHOCARDIOGRAM  02/23/2016   mild LVH,  ef 50-55%,  grade 1 diastolic dysfunction/  mild to moderate AV calcification w/ no stenosis or regurg./  trivial MR and TR/  mild PR    Current Medications: Current Meds  Medication Sig  . Abatacept (ORENCIA [place]) Inject 1 Applicatorful into the skin See admin instructions. Every 4 weeks Next injection due 09-29-16  . acetaminophen (TYLENOL) 500 MG tablet Take 1,000 mg by mouth every 6 (six) hours as needed.  [name] aspirin EC 81 MG tablet Take 81 mg by mouth daily.  . Cholecalciferol (VITAMIN D PO) Take 1 tablet by mouth daily.  . Cyanocobalamin (B-12) 2500 MCG TABS Take 1 tablet by mouth daily.  . DULoxetine (CYMBALTA) 60 MG capsule TAKE ONE CAPSULE EVERY MORNING  . ELIQUIS 5 MG TABS tablet TAKE ONE TABLET TWICE DAILY  . ezetimibe (ZETIA) 10 MG tablet TAKE ONE-HALF TABLET DAILY  . fexofenadine (ALLEGRA) 180 MG tablet Take 180 mg by mouth daily as needed for allergies or rhinitis.   [name] lisinopril (PRINIVIL,ZESTRIL) 5 MG tablet Take 1 tablet (5 mg total) by mouth daily.  [name] LORazepam (ATIVAN) 0.5 MG tablet TAKE ONE OR TWO TABLETS TWICE DAILY AS NEEDED FOR ANXIETY  . metoprolol tartrate (LOPRESSOR) 25 MG tablet Take 25 mg by mouth 2 (two) times daily.  . nitroGLYCERIN (NITROSTAT) 0.4 MG SL tablet ONE TABLET UNDER TONGUE AS NEEDED FOR CHEST PAIN. MAY REPEAT IN 5 MINUTES AND AGAIN IN 5 MINUTES (TOTAL OF 3 TABLETS)  . omeprazole (PRILOSEC) 40 MG capsule TAKE ONE CAPSULE EACH DAY  . ondansetron (ZOFRAN) 4 MG tablet Take 1 tablet (4  mg total) by mouth every 8 (eight) hours as needed for nausea or vomiting.  [name] oxyCODONE-acetaminophen (PERCOCET/ROXICET) 5-325 MG tablet TAKE ONE OR TWO TABLETS EVERY EIGHT HOURS AS NEEDED FOR MODERATE PAINOR SEVERE PAIN  . predniSONE (DELTASONE) 5 MG tablet Take 10 mg by mouth every morning.   [name] Propylene Glycol (SYSTANE BALANCE OP) Place 2 drops into both eyes daily as needed (dry eyes).   . rosuvastatin (CRESTOR) 10 MG tablet TAKE ONE TABLET EACH DAY  . zolpidem (AMBIEN) 10 MG tablet TAKE 1/2 TO 1 TABLET AT BEDTIME AS NEEDED FOR  SLEEP     Allergies:   Methotrexate; Lisinopril; Atorvastatin; and Pravastatin sodium   Social History   Socioeconomic History  . Marital status: Married    Spouse name: Not on file  . Number of children: 2  . Years of education: Not on file  . Highest education level: Not on file  Occupational History  . Occupation: Retired    Fish farm manager: RETIRED  Social Needs  . Financial resource strain: Not hard at all  . Food insecurity:    Worry: Never true    Inability: Never true  . Transportation needs:    Medical: No    Non-medical: No  Tobacco Use  . Smoking status: Former Smoker    Years: 10.00    Types: Cigarettes    Last attempt to quit: 08/09/1968    Years since quitting: 50.2  . Smokeless tobacco: Never Used  Substance and Sexual Activity  . Alcohol use: Yes    Alcohol/week: 7.0 - 14.0 standard drinks    Types: 7 - 14 Glasses of wine per week    Comment: 1-2 glasses of wine nightly  . Drug use: No  . Sexual activity: Yes  Lifestyle  . Physical activity:    Days per week: 4 days    Minutes per session: 60 min  . Stress: Only a little  Relationships  . Social connections:    Talks on phone: More than three times a week    Gets together: More than three times a week    Attends religious service: More than 4 times per year    Active member of club or organization: Not on file    Attends meetings of clubs or organizations: More than 4 times per  year    Relationship status: Married  Other Topics Concern  . Not on file  Social History Narrative   1 Caffeine drink daily      Family History: The patient's family history includes Heart disease in his father; Hypertension in his mother. There is no history of Colon cancer.  ROS:   Please see the history of present illness.    ROS  All other systems reviewed and negative.   EKGs/Labs/Other Studies Reviewed:    The following studies were reviewed today: PAP download  EKG:  EKG is not ordered today.    Recent Labs: 12/13/2017: TSH 1.18 02/07/2018: ALT 19 10/05/2018: BUN 17; Creatinine, Ser 0.87; Hemoglobin 14.1; Platelets 290; Potassium 4.4; Sodium 141   Recent Lipid Panel    Component Value Date/Time   CHOL 164 02/21/2017 1349   TRIG 173.0 (H) 02/21/2017 1349   HDL 52.20 02/21/2017 1349   CHOLHDL 3 02/21/2017 1349   VLDL 34.6 02/21/2017 1349   LDLCALC 78 02/21/2017 1349   LDLDIRECT 168.9 05/24/2013 0732    Physical Exam:    VS:  BP 124/76   Pulse 66   Ht 6' (1.829 m)   Wt 188 lb 12.8 oz (85.6 kg)   SpO2 98%   BMI 25.61 kg/m     Wt Readings from Last 3 Encounters:  10/11/18 188 lb 12.8 oz (85.6 kg)  10/05/18 183 lb (83 kg)  02/13/18 174 lb (78.9 kg)     GEN:  Well nourished, well developed in no acute distress HEENT: Normal NECK: No JVD; No carotid bruits LYMPHATICS: No lymphadenopathy CARDIAC: RRR, no murmurs, rubs, gallops RESPIRATORY:  Clear to auscultation without rales, wheezing or rhonchi  ABDOMEN: Soft, non-tender, non-distended MUSCULOSKELETAL:  No edema; No deformity  SKIN:  Warm and dry NEUROLOGIC:  Alert and oriented x 3 PSYCHIATRIC:  Normal affect   ASSESSMENT:    1. OSA (obstructive sleep apnea)   2. Essential hypertension   3. Coronary artery disease involving native coronary artery of native heart without angina pectoris   4. Paroxysmal atrial fibrillation (HCC)   5. Mixed hyperlipidemia    PLAN:    In order of problems listed  above:  1.  OSA - the patient is tolerating PAP therapy well without any problems. The PAP download was reviewed today and showed an AHI of 2.1/hr on 8 cm H2O with 3% compliance in using more than 4 hours nightly.  The patient has been using and benefiting from PAP use and will continue to benefit from therapy. He has been noncompliant recently because he went to [place] on a river cruise and did not take his device and then when he got home he did not get back into the routine of using it.  I encouraged him to go back to using which he agrees to do since he sleeps better with it.   2.  HTN -  BP is controlled on exam today.  He will continue on Lisinopril 5mg  daily and Lopressor 25mg  BID.  3.  ASCAD - s/p inferior STEMI 12/2013 with total RCA occlusion s/p DESx2, residual 60-70% mLAD.  He denies any anginal sx.  He will continue on ASA 81mg  daily, statin and BB.   4.  PAF - he is maintaining NSR.  He denies any palpitations.  He will continue on Apixaban 5mg  BIDand Lopressor 25mg  BID.  His Creatinine was 0.76 in June 2019. Hbg was stable at 13.2.   5.  Hyperlipidemia - His last LDL was 78 on 02/2017.  He will continue on Zetia 10mg  daily and Crestor 10mg  daily and I will check an FLP and ALT.    Medication Adjustments/Labs and Tests Ordered: Current medicines are reviewed at length with the patient today.  Concerns regarding medicines are outlined above.  No orders of the defined types were placed in this encounter.  No orders of the defined types were placed in this encounter.   Signed, Fransico Him, MD  10/11/2018 9:34 AM    [place]

## 2018-10-19 ENCOUNTER — Other Ambulatory Visit: Payer: Medicare Other | Admitting: *Deleted

## 2018-10-19 DIAGNOSIS — I480 Paroxysmal atrial fibrillation: Secondary | ICD-10-CM | POA: Diagnosis not present

## 2018-10-19 DIAGNOSIS — I2510 Atherosclerotic heart disease of native coronary artery without angina pectoris: Secondary | ICD-10-CM | POA: Diagnosis not present

## 2018-10-19 DIAGNOSIS — M0609 Rheumatoid arthritis without rheumatoid factor, multiple sites: Secondary | ICD-10-CM | POA: Diagnosis not present

## 2018-10-19 DIAGNOSIS — E782 Mixed hyperlipidemia: Secondary | ICD-10-CM

## 2018-10-19 LAB — LIPID PANEL
Chol/HDL Ratio: 2.6 ratio (ref 0.0–5.0)
Cholesterol, Total: 166 mg/dL (ref 100–199)
HDL: 63 mg/dL (ref >39)
LDL Calculated: 78 mg/dL (ref 0–99)
Triglycerides: 125 mg/dL (ref 0–149)
VLDL Cholesterol Cal: 25 mg/dL (ref 5–40)

## 2018-10-19 LAB — BASIC METABOLIC PANEL
BUN/Creatinine Ratio: 19 (ref 10–24)
BUN: 15 mg/dL (ref 8–27)
CO2: 23 mmol/L (ref 20–29)
Calcium: 9.3 mg/dL (ref 8.6–10.2)
Chloride: 103 mmol/L (ref 96–106)
Creatinine, Ser: 0.81 mg/dL (ref 0.76–1.27)
GFR calc Af Amer: 95 mL/min/{1.73_m2} (ref >59)
GFR calc non Af Amer: 82 mL/min/{1.73_m2} (ref >59)
Glucose: 104 mg/dL — ABNORMAL HIGH (ref 65–99)
Potassium: 4.4 mmol/L (ref 3.5–5.2)
Sodium: 139 mmol/L (ref 134–144)

## 2018-10-19 LAB — HEPATIC FUNCTION PANEL
ALT: 15 IU/L (ref 0–44)
AST: 16 IU/L (ref 0–40)
Albumin: 4.4 g/dL (ref 3.5–4.7)
Alkaline Phosphatase: 64 IU/L (ref 39–117)
Bilirubin Total: 0.4 mg/dL (ref 0.0–1.2)
Bilirubin, Direct: 0.11 mg/dL (ref 0.00–0.40)
Total Protein: 7.1 g/dL (ref 6.0–8.5)

## 2018-10-24 ENCOUNTER — Telehealth: Payer: Self-pay | Admitting: *Deleted

## 2018-10-24 DIAGNOSIS — E782 Mixed hyperlipidemia: Secondary | ICD-10-CM

## 2018-10-24 DIAGNOSIS — I2510 Atherosclerotic heart disease of native coronary artery without angina pectoris: Secondary | ICD-10-CM

## 2018-10-24 MED ORDER — ROSUVASTATIN CALCIUM 20 MG PO TABS: 20.0000 mg | ORAL_TABLET | Freq: Every day | ORAL | 3 refills | Status: DC

## 2018-10-24 NOTE — Telephone Encounter (Signed)
Pt has been notified of lab results by phone with verbal understanding. Pt advised to increase Crestor to 20 mg daily and repeat labs in 6 weeks. New Rx sent to [facility] with labs being done on 12/04/18 FLP/ALT.

## 2018-10-24 NOTE — Telephone Encounter (Signed)
-----   Message from Sueanne Margarita, MD sent at 10/20/2018  8:52 AM EST ----- LDL is not at goal.  Please increase Crestor to 20mg  daily and repeat FLP and ALT in 6 weeks

## 2018-11-12 DIAGNOSIS — E782 Mixed hyperlipidemia: Secondary | ICD-10-CM

## 2018-11-14 NOTE — Telephone Encounter (Signed)
Order placed for lipid clinic and messages sent to patient and [facility].

## 2018-11-16 ENCOUNTER — Ambulatory Visit (INDEPENDENT_AMBULATORY_CARE_PROVIDER_SITE_OTHER): Payer: Medicare Other | Admitting: Pharmacist

## 2018-11-16 DIAGNOSIS — E782 Mixed hyperlipidemia: Secondary | ICD-10-CM | POA: Diagnosis not present

## 2018-11-16 MED ORDER — ROSUVASTATIN CALCIUM 5 MG PO TABS: 5.0000 mg | ORAL_TABLET | Freq: Every day | ORAL | 3 refills | Status: DC

## 2018-11-16 MED ORDER — EVOLOCUMAB 140 MG/ML ~~LOC~~ SOAJ: 1.0000 "pen " | SUBCUTANEOUS | 11 refills | Status: DC

## 2018-11-16 NOTE — Patient Instructions (Addendum)
It was nice to see you today  Restart your rosuvastatin 5mg  daily  Continue taking your ezetimibe (Zetia) for now  I will submit paperwork to  Your insurance for either Praluent or Repatha shots for your cholesterol  If the cost is too high, we can wait for bempedoic acid to be approved in February or March  Call [facility], Pharmacist with any questions [phone]

## 2018-11-16 NOTE — Progress Notes (Signed)
Patient ID: [name]                 DOB: 1933-03-04                    MRN: [id number]     HPI: RAUN ROUTH is a 85 y.o. male patient referred to lipid clinic by Dr Radford Pax. PMH is significant for CAD (inferior STEMI 12/2013 with total RCA occlusion s/p DESx2, residual 60-70% mLAD), PAF, PACs, OSA on CPAP, GERD, HTN, and HLDwith statin intolerance. His rosuvastatin was increased from 10mg  to 20mg  last month, however pt developed severe muscle pain and weakness and has since stopped his rosuvastatin. He presents to lipid clinic to discuss alternative options.  Pt presents today in good spirits with his wife. Reports myalgias have resolved since stopping rosuvastatin 20mg  3-4 days ago. He previously experienced similar myalgias on both atorvastatin and pravastatin.  Current Medications: Zetia 5mg  daily Intolerances: rosuvastatin 20mg  daily - severe muscle pain and weakness, atorvastatin 10mg  daily, pravastatin - myalgias Risk Factors: CAD s/p STEMI, HTN, age, former tobacco abuse, family history of heart disease LDL goal: 70mg /dL  Diet: Fast food once a week. Overall eats well, likes greens and lean meat. Does snack on ice cream.  Exercise: Tries to stay active, but does have RA. Walks every day with his dog for 45 minutes.  Family History: The patient's family history includes Heart disease in his father; Hypertension in his mother. There is no history of Colon cancer.  Social History: Former smoker, quit in 1969. Drinks 1-2 glasses of wine each night, denies illicit drug use.  Labs: 10/19/18: TC 166, TG 125, HDL 63, LDL 78 (rosuvastatin 10mg  daily, Zetia 10mg  daily)  Past Medical History:  Diagnosis Date  . CAD (coronary artery disease)    a. s/p inferior STEMI 01/08/2014; LHC 01/08/14: total RCA occlusion s/p 3.5x40mm Xience DES distal RCA and 3.5x28 mm DES mid RCA, 60-70% mid LAD stenosis, EF 55%.  . Complication of anesthesia    'long to wake up after back surgery " 09/2003  .  Diverticulosis of colon   . GERD (gastroesophageal reflux disease)   . History of cellulitis    05-26-2015  LLE  . History of colon polyps    1998- benign/  2008 adenomatous   . History of kidney stones    2013  . History of squamous cell carcinoma excision    2013;  2015;  06-12-2015 right leg/  02/ and 05/ 2017  left ear and left leg  . Hydronephrosis, left   . Hyperlipidemia   . Hypertension   . Migraine with aura   . OSA on CPAP 06/19/2015   Moderate OSA with AHI 18/hr  per study 05-20-2015  . Osteoarthritis   . Paroxysmal atrial fibrillation ([place]) 4/16   chads2vasc score is at least 4  . Premature atrial contractions   . RA (rheumatoid arthritis) [facility])    rheumatologist-  dr Leigh Aurora  . Sinusitis, chronic 10/17/2015  . Wears glasses   . Wears hearing aid    bilateral    Current Outpatient Medications on File Prior to Visit  Medication Sig Dispense Refill  . Abatacept (ORENCIA [place]) Inject 1 Applicatorful into the skin See admin instructions. Every 4 weeks Next injection due 09-29-16    . acetaminophen (TYLENOL) 500 MG tablet Take 1,000 mg by mouth every 6 (six) hours as needed.    [name] aspirin EC 81 MG tablet Take 81 mg by  mouth daily.    . Cholecalciferol (VITAMIN D PO) Take 1 tablet by mouth daily.    . Cyanocobalamin (B-12) 2500 MCG TABS Take 1 tablet by mouth daily.    . DULoxetine (CYMBALTA) 60 MG capsule TAKE ONE CAPSULE EVERY MORNING 90 capsule 3  . ELIQUIS 5 MG TABS tablet TAKE ONE TABLET TWICE DAILY 180 tablet 1  . ezetimibe (ZETIA) 10 MG tablet TAKE ONE-HALF TABLET DAILY 45 tablet 3  . fexofenadine (ALLEGRA) 180 MG tablet Take 180 mg by mouth daily as needed for allergies or rhinitis.     [name] lisinopril (PRINIVIL,ZESTRIL) 5 MG tablet Take 1 tablet (5 mg total) by mouth daily. 90 tablet 0  . LORazepam (ATIVAN) 0.5 MG tablet TAKE ONE OR TWO TABLETS TWICE DAILY AS NEEDED FOR ANXIETY 60 tablet 2  . metoprolol tartrate (LOPRESSOR) 25 MG tablet Take 25 mg by mouth 2  (two) times daily.    . nitroGLYCERIN (NITROSTAT) 0.4 MG SL tablet ONE TABLET UNDER TONGUE AS NEEDED FOR CHEST PAIN. MAY REPEAT IN 5 MINUTES AND AGAIN IN 5 MINUTES (TOTAL OF 3 TABLETS) 25 tablet 6  . omeprazole (PRILOSEC) 40 MG capsule TAKE ONE CAPSULE EACH DAY 90 capsule 3  . ondansetron (ZOFRAN) 4 MG tablet Take 1 tablet (4 mg total) by mouth every 8 (eight) hours as needed for nausea or vomiting. 20 tablet 0  . oxyCODONE-acetaminophen (PERCOCET/ROXICET) 5-325 MG tablet TAKE ONE OR TWO TABLETS EVERY EIGHT HOURS AS NEEDED FOR MODERATE PAINOR SEVERE PAIN 90 tablet 0  . predniSONE (DELTASONE) 5 MG tablet Take 10 mg by mouth every morning.   0  . Propylene Glycol (SYSTANE BALANCE OP) Place 2 drops into both eyes daily as needed (dry eyes).     . rosuvastatin (CRESTOR) 20 MG tablet Take 1 tablet (20 mg total) by mouth daily. 90 tablet 3  . zolpidem (AMBIEN) 10 MG tablet TAKE 1/2 TO 1 TABLET AT BEDTIME AS NEEDED FOR SLEEP 30 tablet 5   No current facility-administered medications on file prior to visit.     Allergies  Allergen Reactions  . Methotrexate Other (See Comments)    REACTION: tachycardia  . Lisinopril Other (See Comments)    cough  . Atorvastatin Other (See Comments)    Muscle pain, leg cramps  . Pravastatin Sodium Other (See Comments)    REACTION: cramps, fatigue    Assessment/Plan:  1. Hyperlipidemia - LDL 78mg /dL above goal < 70 on Zetia 5mg  daily and Crestor 5mg  daily. Pt did not tolerate dose increase of Crestor and is intolerant to low doses of atorvastatin and pravastatin. Discussed PCSK9i therapy today including expected benefits, side effects, and injection technique. Will submit prior authorization for Repatha. Pt will also resume Crestor 5mg  daily. Will f/u with pt once copay information is available. If cost prohibitive, will consider dose increase of Zetia, Crestor to 10mg , or bempedoic acid once it's FDA approved.   Megan E. Supple, PharmD, BCACP, [place] [address] [place], [place] [address] Phone: [phone]; Fax: [phone] 11/16/2018 12:33 PM  Addendum: Repatha prior authorization approved. Monthly copay anticipated $30-45 per month. Pt is aware and will call when he receives his first shipment so we can schedule follow up lab work.

## 2018-11-17 ENCOUNTER — Other Ambulatory Visit: Payer: Self-pay | Admitting: Internal Medicine

## 2018-11-17 DIAGNOSIS — Z79899 Other long term (current) drug therapy: Secondary | ICD-10-CM | POA: Diagnosis not present

## 2018-11-17 DIAGNOSIS — M0609 Rheumatoid arthritis without rheumatoid factor, multiple sites: Secondary | ICD-10-CM | POA: Diagnosis not present

## 2018-11-22 NOTE — Progress Notes (Addendum)
Subjective:   [name] is a 85 y.o. male who presents for Medicare Annual/Subsequent preventive examination.  Review of Systems:  No ROS.  Medicare Wellness Visit. Additional risk factors are reflected in the social history.  Cardiac Risk Factors include: advanced age (>35men, >17 women);dyslipidemia;male gender;hypertension Sleep patterns: gets up 1-2 times nightly to void and sleeps 6-7 hours nightly.  Wears C-PAP  Home Safety/Smoke Alarms: Feels safe in home. Smoke alarms in place.  Living environment; residence and Firearm Safety: [place], can live on one level, no firearms. Lives with wife, no needs for DME, good support system  Seat Belt Safety/Bike Helmet: Wears seat belt.   Male:   PSA-  Lab Results  Component Value Date   PSA 2.46 02/21/2017   PSA 1.52 06/04/2011   PSA 1.84 06/04/2010       Objective:    Vitals: BP (!) 156/96 (BP Location: Right Arm, Patient Position: Sitting, Cuff Size: Normal)   Pulse 61   Temp 97.7 F (36.5 C)   Resp 16   Ht 6' (1.829 m)   Wt 185 lb (83.9 kg)   SpO2 97%   BMI 25.09 kg/m   Body mass index is 25.09 kg/m.  Advanced Directives 11/23/2018 11/22/2017 11/18/2016 09/17/2016 09/15/2016 09/13/2016 08/11/2016  Does Patient Have a Medical Advance Directive? Yes Yes Yes Yes Yes Yes Yes  Type of Paramedic of [place];Living will [place];Living will [place];Living will Healthcare Power of [place];Living will [place];Living will [place];Living will  Does patient want to make changes to medical advance directive? - - - No - Patient declined No - Patient declined - -  Copy of Excel in Chart? No - copy requested No - copy requested No - copy requested No - copy requested No - copy requested No - copy requested -  Pre-existing out of facility DNR order (yellow form or pink MOST form)  - - - - - - -    Tobacco Social History   Tobacco Use  Smoking Status Former Smoker  . Years: 10.00  . Types: Cigarettes  . Last attempt to quit: 08/09/1968  . Years since quitting: 50.3  Smokeless Tobacco Never Used     Counseling given: Not Answered  Past Medical History:  Diagnosis Date  . CAD (coronary artery disease)    a. s/p inferior STEMI 01/08/2014; LHC 01/08/14: total RCA occlusion s/p 3.5x61mm Xience DES distal RCA and 3.5x28 mm DES mid RCA, 60-70% mid LAD stenosis, EF 55%.  . Complication of anesthesia    'long to wake up after back surgery " 09/2003  . Diverticulosis of colon   . GERD (gastroesophageal reflux disease)   . History of cellulitis    05-26-2015  LLE  . History of colon polyps    1998- benign/  2008 adenomatous   . History of kidney stones    2013  . History of squamous cell carcinoma excision    2013;  2015;  06-12-2015 right leg/  02/ and 05/ 2017  left ear and left leg  . Hydronephrosis, left   . Hyperlipidemia   . Hypertension   . Migraine with aura   . OSA on CPAP 06/19/2015   Moderate OSA with AHI 18/hr  per study 05-20-2015  . Osteoarthritis   . Paroxysmal atrial fibrillation ([place]) 4/16   chads2vasc score is at least 4  . Premature atrial  contractions   . RA (rheumatoid arthritis) [facility])    rheumatologist-  dr Leigh Aurora  . Sinusitis, chronic 10/17/2015  . Wears glasses   . Wears hearing aid    bilateral   Past Surgical History:  Procedure Laterality Date  . BACK SURGERY    . CARDIOVASCULAR STRESS TEST  11/21/2015   Low risk nuclear study w/ a small diaphragmatic attenuation artifact, no ischemia/  normal LV function and wall motion , ef 63%  . CATARACT EXTRACTION W/ INTRAOCULAR LENS  IMPLANT, BILATERAL  2015  . COLONOSCOPY  last one 06-09-2011  . CORONARY ANGIOPLASTY    . CYSTOSCOPY W/ RETROGRADES Left 07/12/2016   Procedure: CYSTOSCOPY WITH RETROGRADE PYELOGRAM LEFT URETERAL STENT;  Surgeon: Irine Seal, MD;  Location: [facility];   Service: Urology;  Laterality: Left;  . CYSTOSCOPY WITH RETROGRADE PYELOGRAM, URETEROSCOPY AND STENT PLACEMENT Left 08/11/2016   Procedure: CYSTOSCOPY WITH RETROGRADE PYELOGRAM,  DIAGNOSTIC URETEROSCOPY , STENT EXCHANGE;  Surgeon: Alexis Frock, MD;  Location: [facility];  Service: Urology;  Laterality: Left;  . 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   severe fibromatosis palm and fingers  . LEFT HEART CATHETERIZATION WITH CORONARY ANGIOGRAM N/A 01/08/2014   Procedure: LEFT HEART CATHETERIZATION WITH CORONARY ANGIOGRAM;  Surgeon: Troy Sine, MD;  Location: [facility] CATH LAB;  Service: Cardiovascular;  Laterality:N/A;  total/ subtotal RCA/  mLAD 40-34% w/ mid systolic bridging/  preserved global LVF, ef 55%  . MOHS SURGERY  x2  feb and may 2017   left ear /  left leg  ([facility])  . PERCUTANEOUS CORONARY STENT INTERVENTION (PCI-S)  01/08/2014   Procedure: PERCUTANEOUS CORONARY STENT INTERVENTION (PCI-S);  Surgeon: Troy Sine, MD;  Location: [facility] CATH LAB;  Service: Cardiovascular;;  DES to mid and distal RCA  . POSTERIOR LUMBAR FUSION  10/01/2003   and Laminectomy/ diskectomy  L4 -- S1  . ROBOT ASSISTED PYELOPLASTY Left 09/15/2016   Procedure: XI ROBOTIC ASSISTED PYELOPLASTY;  Surgeon: Alexis Frock, MD;  Location: [facility];  Service: Urology;  Laterality: Left;  . TONSILLECTOMY    . TRANSTHORACIC ECHOCARDIOGRAM  02/23/2016   mild LVH,  ef 50-55%,  grade 1 diastolic dysfunction/  mild to moderate AV calcification w/ no stenosis or regurg./  trivial MR and TR/  mild PR   Family History  Problem Relation Age of Onset  . Hypertension Mother   . Heart disease Father   . Colon cancer Neg Hx    Social History   Socioeconomic History  . Marital status: Married    Spouse name: Not on file  . Number of children: 2  . Years of education: Not on file  . Highest education level: Not on file  Occupational History  . Occupation: Retired    Fish farm manager: RETIRED  Social Needs  .  Financial resource strain: Not hard at all  . Food insecurity:    Worry: Never true    Inability: Never true  . Transportation needs:    Medical: No    Non-medical: No  Tobacco Use  . Smoking status: Former Smoker    Years: 10.00    Types: Cigarettes    Last attempt to quit: 08/09/1968    Years since quitting: 50.3  . Smokeless tobacco: Never Used  Substance and Sexual Activity  . Alcohol use: Yes    Alcohol/week: 2.0 standard drinks    Types: 2 Shots of liquor per week    Comment: daily  . Drug use: No  . Sexual activity:  Yes  Lifestyle  . Physical activity:    Days per week: 4 days    Minutes per session: 30 min  . Stress: Not at all  Relationships  . Social connections:    Talks on phone: More than three times a week    Gets together: More than three times a week    Attends religious service: 1 to 4 times per year    Active member of club or organization: Yes    Attends meetings of clubs or organizations: More than 4 times per year    Relationship status: Married  Other Topics Concern  . Not on file  Social History Narrative   1 Caffeine drink daily     Outpatient Encounter Medications as of 11/23/2018  Medication Sig  . Abatacept (ORENCIA [place]) Inject 1 Applicatorful into the skin See admin instructions. Every 4 weeks Next injection due 09-29-16  . acetaminophen (TYLENOL) 500 MG tablet Take 1,000 mg by mouth every 6 (six) hours as needed.  [name] aspirin EC 81 MG tablet Take 81 mg by mouth daily.  . Cholecalciferol (VITAMIN D PO) Take 1 tablet by mouth daily.  . Cyanocobalamin (B-12) 2500 MCG TABS Take 1 tablet by mouth daily.  . DULoxetine (CYMBALTA) 60 MG capsule TAKE ONE CAPSULE EVERY MORNING  . ELIQUIS 5 MG TABS tablet TAKE ONE TABLET TWICE DAILY  . ezetimibe (ZETIA) 10 MG tablet TAKE ONE-HALF TABLET DAILY  . fexofenadine (ALLEGRA) 180 MG tablet Take 180 mg by mouth daily as needed for allergies or rhinitis.   [name] lisinopril (PRINIVIL,ZESTRIL) 5 MG tablet Take 1 tablet  (5 mg total) by mouth daily.  [name] LORazepam (ATIVAN) 0.5 MG tablet TAKE ONE OR TWO TABLETS TWICE DAILY AS NEEDED FOR ANXIETY  . metoprolol tartrate (LOPRESSOR) 25 MG tablet Take 25 mg by mouth 2 (two) times daily.  . nitroGLYCERIN (NITROSTAT) 0.4 MG SL tablet ONE TABLET UNDER TONGUE AS NEEDED FOR CHEST PAIN. MAY REPEAT IN 5 MINUTES AND AGAIN IN 5 MINUTES (TOTAL OF 3 TABLETS)  . omeprazole (PRILOSEC) 40 MG capsule TAKE ONE CAPSULE EACH DAY  . ondansetron (ZOFRAN) 4 MG tablet Take 1 tablet (4 mg total) by mouth every 8 (eight) hours as needed for nausea or vomiting.  [name] oxyCODONE-acetaminophen (PERCOCET/ROXICET) 5-325 MG tablet TAKE ONE OR TWO TABLETS EVERY EIGHT HOURS AS NEEDED FOR MODERATE PAINOR SEVERE PAIN  . predniSONE (DELTASONE) 5 MG tablet Take 10 mg by mouth every morning.   [name] Propylene Glycol (SYSTANE BALANCE OP) Place 2 drops into both eyes daily as needed (dry eyes).   . rosuvastatin (CRESTOR) 5 MG tablet Take 1 tablet (5 mg total) by mouth daily.  [name] zolpidem (AMBIEN) 10 MG tablet TAKE 1/2 TO 1 TABLET AT BEDTIME AS NEEDED FOR SLEEP  . Evolocumab (REPATHA SURECLICK) 824 MG/ML SOAJ Inject 1 pen into the skin every 14 (fourteen) days. (Patient not taking: Reported on 11/23/2018)   No facility-administered encounter medications on file as of 11/23/2018.     Activities of Daily Living In your present state of health, do you have any difficulty performing the following activities: 11/23/2018  Hearing? Y  Vision? N  Difficulty concentrating or making decisions? N  Walking or climbing stairs? N  Dressing or bathing? N  Doing errands, shopping? N  Preparing Food and eating ? N  Using the Toilet? N  In the past six months, have you accidently leaked urine? N  Do you have problems with loss of bowel control? N  Managing  your Medications? N  Comment uses med box  Managing your Finances? N  Housekeeping or managing your Housekeeping? N  Some recent data might be hidden    Patient Care  Team: Plotnikov, Evie Lacks, MD as PCP - General Sueanne Margarita, MD as PCP - Cardiology (Cardiology) Thompson Grayer, MD as PCP - Electrophysiology (Cardiology) Irene Shipper, MD (Gastroenterology) Thompson Grayer, MD (Cardiology) Hennie Duos, MD as Consulting Physician (Rheumatology) Alexis Frock, MD as Consulting Physician (Urology) Katy Apo, MD as Consulting Physician (Ophthalmology) Mathis Fare (Dentistry) Tiajuana Amass, MD as Referring Physician (Allergy and Immunology)   Assessment:   This is a routine wellness examination for [place]. Physical assessment deferred to PCP.   Exercise Activities and Dietary recommendations Current Exercise Habits: Home exercise routine, Type of exercise: walking, Time (Minutes): 30, Frequency (Times/Week): 4, Weekly Exercise (Minutes/Week): 120, Intensity: Mild, Exercise limited by: orthopedic condition(s)  Diet (meal preparation, eat out, water intake, caffeinated beverages, dairy products, fruits and vegetables): in general, a "healthy" diet  , well balanced   Reviewed heart healthy diet. Encouraged patient to increase daily water and healthy fluid intake.  Goals      Patient Stated   . patient states (pt-stated)     Maintain current weight by increasing activity and making healthy food choices.       Other   . Patient Stated     Stay as healthy and as independent as possible. Travel, enjoy family and life.    . Patient Stated     Limit alcohol consumption       Fall Risk Fall Risk  11/23/2018 11/22/2017 11/18/2016 12/26/2014  Falls in the past year? 1 No No Yes  Number falls in past yr: 0 - - 1  Comment - - - gets off balance  Injury with Fall? 0 - - No  Risk for fall due to : History of fall(s) - - -  Follow up Falls evaluation completed;Falls prevention discussed - - -   Depression Screen PHQ 2/9 Scores 11/23/2018 11/22/2017 11/18/2016 12/26/2014  PHQ - 2 Score 0 1 0 0  PHQ- 9 Score - 3 - -  Exception Documentation - - - Patient  refusal    Cognitive Function MMSE - Mini Mental State Exam 11/22/2017  Orientation to time 5  Orientation to Place 5  Registration 3  Attention/ Calculation 5  Recall 1  Language- name 2 objects 2  Language- repeat 1  Language- follow 3 step command 3  Language- read & follow direction 1  Write a sentence 1  Copy design 1  Total score 28       Ad8 score reviewed for issues:  Issues making decisions: no  Less interest in hobbies / activities: no  Repeats questions, stories (family complaining): no  Trouble using ordinary gadgets (microwave, computer, phone):no  Forgets the month or year: no  Mismanaging finances: no  Remembering appts: no  Daily problems with thinking and/or memory: no Ad8 score is= 0  Immunization History  Administered Date(s) Administered  . Influenza Split 08/10/2011, 08/15/2012  . Influenza Whole 08/21/2009, 09/22/2010  . Influenza, High Dose Seasonal PF 09/16/2015, 08/18/2016, 08/14/2018  . Influenza,inj,Quad PF,6+ Mos 08/06/2013  . Influenza-Unspecified 08/31/2017  . Pneumococcal Conjugate-13 10/02/2013  . Pneumococcal Polysaccharide-23 06/04/2010  . Tdap 10/22/2011  . Zoster Recombinat (Shingrix) 03/09/2017, 05/10/2017   Screening Tests Health Maintenance  Topic Date Due  . TETANUS/TDAP  10/21/2021  . INFLUENZA VACCINE  Completed  . PNA vac Low Risk  Adult  Completed      Plan:     Bring a copy of your living will and/or healthcare power of attorney to your next office visit.  Continue doing brain stimulating activities (puzzles, reading, adult coloring books, staying active) to keep memory sharp.    I have personally reviewed and noted the following in the patient's chart:   . Medical and social history . Use of alcohol, tobacco or illicit drugs  . Current medications and supplements . Functional ability and status . Nutritional status . Physical activity . Advanced directives . List of other  physicians . Vitals . Screenings to include cognitive, depression, and falls . Referrals and appointments  In addition, I have reviewed and discussed with patient certain preventive protocols, quality metrics, and best practice recommendations. A written personalized care plan for preventive services as well as general preventive health recommendations were provided to patient.     Michiel Cowboy, RN  11/23/2018  Medical screening examination/treatment/procedure(s) were performed by non-physician practitioner and as supervising physician I was immediately available for consultation/collaboration. I agree with above. Lew Dawes, MD

## 2018-11-23 ENCOUNTER — Ambulatory Visit (INDEPENDENT_AMBULATORY_CARE_PROVIDER_SITE_OTHER): Payer: Medicare Other | Admitting: *Deleted

## 2018-11-23 VITALS — BP 156/96 | HR 61 | Temp 97.7°F | Resp 16 | Ht 72.0 in | Wt 185.0 lb

## 2018-11-23 DIAGNOSIS — Z0000 Encounter for general adult medical examination without abnormal findings: Secondary | ICD-10-CM | POA: Diagnosis not present

## 2018-11-23 NOTE — Patient Instructions (Addendum)
Bring a copy of your living will and/or healthcare power of attorney to your next office visit.  Continue doing brain stimulating activities (puzzles, reading, adult coloring books, staying active) to keep memory sharp.    Fall Prevention in the Home, Adult Falls can cause injuries. They can happen to people of all ages. There are many things you can do to make your home safe and to help prevent falls. Ask for help when making these changes, if needed. What actions can I take to prevent falls? General Instructions  Use good lighting in all rooms. Replace any light bulbs that burn out.  Turn on the lights when you go into a dark area. Use night-lights.  Keep items that you use often in easy-to-reach places. Lower the shelves around your home if necessary.  Set up your furniture so you have a clear path. Avoid moving your furniture around.  Do not have throw rugs and other things on the floor that can make you trip.  Avoid walking on wet floors.  If any of your floors are uneven, fix them.  Add color or contrast paint or tape to clearly mark and help you see: ? Any grab bars or handrails. ? First and last steps of stairways. ? Where the edge of each step is.  If you use a stepladder: ? Make sure that it is fully opened. Do not climb a closed stepladder. ? Make sure that both sides of the stepladder are locked into place. ? Ask someone to hold the stepladder for you while you use it.  If there are any pets around you, be aware of where they are. What can I do in the bathroom?      Keep the floor dry. Clean up any water that spills onto the floor as soon as it happens.  Remove soap buildup in the tub or shower regularly.  Use non-skid mats or decals on the floor of the tub or shower.  Attach bath mats securely with double-sided, non-slip rug tape.  If you need to sit down in the shower, use a plastic, non-slip stool.  Install grab bars by the toilet and in the tub and  shower. Do not use towel bars as grab bars. What can I do in the bedroom?  Make sure that you have a light by your bed that is easy to reach.  Do not use any sheets or blankets that are too big for your bed. They should not hang down onto the floor.  Have a firm chair that has side arms. You can use this for support while you get dressed. What can I do in the kitchen?  Clean up any spills right away.  If you need to reach something above you, use a strong step stool that has a grab bar.  Keep electrical cords out of the way.  Do not use floor polish or wax that makes floors slippery. If you must use wax, use non-skid floor wax. What can I do with my stairs?  Do not leave any items on the stairs.  Make sure that you have a light switch at the top of the stairs and the bottom of the stairs. If you do not have them, ask someone to add them for you.  Make sure that there are handrails on both sides of the stairs, and use them. Fix handrails that are broken or loose. Make sure that handrails are as long as the stairways.  Install non-slip stair treads on all   stairs in your home.  Avoid having throw rugs at the top or bottom of the stairs. If you do have throw rugs, attach them to the floor with carpet tape.  Choose a carpet that does not hide the edge of the steps on the stairway.  Check any carpeting to make sure that it is firmly attached to the stairs. Fix any carpet that is loose or worn. What can I do on the outside of my home?  Use bright outdoor lighting.  Regularly fix the edges of walkways and driveways and fix any cracks.  Remove anything that might make you trip as you walk through a door, such as a raised step or threshold.  Trim any bushes or trees on the path to your home.  Regularly check to see if handrails are loose or broken. Make sure that both sides of any steps have handrails.  Install guardrails along the edges of any raised decks and porches.  Clear  walking paths of anything that might make someone trip, such as tools or rocks.  Have any leaves, snow, or ice cleared regularly.  Use sand or salt on walking paths during winter.  Clean up any spills in your garage right away. This includes grease or oil spills. What other actions can I take?  Wear shoes that: ? Have a low heel. Do not wear high heels. ? Have rubber bottoms. ? Are comfortable and fit you well. ? Are closed at the toe. Do not wear open-toe sandals.  Use tools that help you move around (mobility aids) if they are needed. These include: ? Canes. ? Walkers. ? Scooters. ? Crutches.  Review your medicines with your doctor. Some medicines can make you feel dizzy. This can increase your chance of falling. Ask your doctor what other things you can do to help prevent falls. Where to find more information  Centers for Disease Control and Prevention, STEAD[employer]: https://cdc.gov  National Institute on Aging: https://go4life.nia.nih.gov Contact a doctor if:  You are afraid of falling at home.  You feel weak, drowsy, or dizzy at home.  You fall at home. Summary  There are many simple things that you can do to make your home safe and to help prevent falls.  Ways to make your home safe include removing tripping hazards and installing grab bars in the bathroom.  Ask for help when making these changes in your home. This information is not intended to replace advice given to you by your health care provider. Make sure you discuss any questions you have with your health care provider. Document Released: 08/28/2009 Document Revised: 06/16/2017 Document Reviewed: 06/16/2017 Elsevier Interactive Patient Education  2019 Elsevier Inc.   Health Maintenance, Male A healthy lifestyle and preventive care is important for your health and wellness. Ask your health care provider about what schedule of regular examinations is right for you. What should I know about weight and diet? Eat  a Healthy Diet  Eat plenty of vegetables, fruits, whole grains, low-fat dairy products, and lean protein.  Do not eat a lot of foods high in solid fats, added sugars, or salt.  Maintain a Healthy Weight Regular exercise can help you achieve or maintain a healthy weight. You should:  Do at least 150 minutes of exercise each week. The exercise should increase your heart rate and make you sweat (moderate-intensity exercise).  Do strength-training exercises at least twice a week. Watch Your Levels of Cholesterol and Blood Lipids  Have your blood tested for lipids and   cholesterol every 5 years starting at 83 years of age. If you are at high risk for heart disease, you should start having your blood tested when you are 83 years old. You may need to have your cholesterol levels checked more often if: ? Your lipid or cholesterol levels are high. ? You are older than 83 years of age. ? You are at high risk for heart disease. What should I know about cancer screening? Many types of cancers can be detected early and may often be prevented. Lung Cancer  You should be screened every year for lung cancer if: ? You are a current smoker who has smoked for at least 30 years. ? You are a former smoker who has quit within the past 15 years.  Talk to your health care provider about your screening options, when you should start screening, and how often you should be screened. Colorectal Cancer  Routine colorectal cancer screening usually begins at 83 years of age and should be repeated every 5-10 years until you are 83 years old. You may need to be screened more often if early forms of precancerous polyps or small growths are found. Your health care provider may recommend screening at an earlier age if you have risk factors for colon cancer.  Your health care provider may recommend using home test kits to check for hidden blood in the stool.  A small camera at the end of a tube can be used to examine  your colon (sigmoidoscopy or colonoscopy). This checks for the earliest forms of colorectal cancer. Prostate and Testicular Cancer  Depending on your age and overall health, your health care provider may do certain tests to screen for prostate and testicular cancer.  Talk to your health care provider about any symptoms or concerns you have about testicular or prostate cancer. Skin Cancer  Check your skin from head to toe regularly.  Tell your health care provider about any new moles or changes in moles, especially if: ? There is a change in a mole's size, shape, or color. ? You have a mole that is larger than a pencil eraser.  Always use sunscreen. Apply sunscreen liberally and repeat throughout the day.  Protect yourself by wearing long sleeves, pants, a wide-brimmed hat, and sunglasses when outside. What should I know about heart disease, diabetes, and high blood pressure?  If you are 18-39 years of age, have your blood pressure checked every 3-5 years. If you are 40 years of age or older, have your blood pressure checked every year. You should have your blood pressure measured twice-once when you are at a hospital or clinic, and once when you are not at a hospital or clinic. Record the average of the two measurements. To check your blood pressure when you are not at a hospital or clinic, you can use: ? An automated blood pressure machine at a pharmacy. ? A home blood pressure monitor.  Talk to your health care provider about your target blood pressure.  If you are between 45-79 years old, ask your health care provider if you should take aspirin to prevent heart disease.  Have regular diabetes screenings by checking your fasting blood sugar level. ? If you are at a normal weight and have a low risk for diabetes, have this test once every three years after the age of 45. ? If you are overweight and have a high risk for diabetes, consider being tested at a younger age or more often.  A    one-time screening for abdominal aortic aneurysm (AAA) by ultrasound is recommended for men aged 65-75 years who are current or former smokers. What should I know about preventing infection? Hepatitis B If you have a higher risk for hepatitis B, you should be screened for this virus. Talk with your health care provider to find out if you are at risk for hepatitis B infection. Hepatitis C Blood testing is recommended for:  Everyone born from 1945 through 1965.  Anyone with known risk factors for hepatitis C. Sexually Transmitted Diseases (STDs)  You should be screened each year for STDs including gonorrhea and chlamydia if: ? You are sexually active and are younger than 83 years of age. ? You are older than 83 years of age and your health care provider tells you that you are at risk for this type of infection. ? Your sexual activity has changed since you were last screened and you are at an increased risk for chlamydia or gonorrhea. Ask your health care provider if you are at risk.  Talk with your health care provider about whether you are at high risk of being infected with HIV. Your health care provider may recommend a prescription medicine to help prevent HIV infection. What else can I do?  Schedule regular health, dental, and eye exams.  Stay current with your vaccines (immunizations).  Do not use any tobacco products, such as cigarettes, chewing tobacco, and e-cigarettes. If you need help quitting, ask your health care provider.  Limit alcohol intake to no more than 2 drinks per day. One drink equals 12 ounces of beer, 5 ounces of wine, or 1 ounces of hard liquor.  Do not use street drugs.  Do not share needles.  Ask your health care provider for help if you need support or information about quitting drugs.  Tell your health care provider if you often feel depressed.  Tell your health care provider if you have ever been abused or do not feel safe at home. This information is  not intended to replace advice given to you by your health care provider. Make sure you discuss any questions you have with your health care provider. Document Released: 04/29/2008 Document Revised: 06/30/2016 Document Reviewed: 08/05/2015 Elsevier Interactive Patient Education  2019 Elsevier Inc.  

## 2018-11-24 ENCOUNTER — Encounter: Payer: Self-pay | Admitting: Internal Medicine

## 2018-12-04 ENCOUNTER — Other Ambulatory Visit: Payer: Medicare Other

## 2018-12-18 ENCOUNTER — Other Ambulatory Visit: Payer: Self-pay | Admitting: Internal Medicine

## 2018-12-21 ENCOUNTER — Encounter: Payer: Self-pay | Admitting: Internal Medicine

## 2018-12-21 ENCOUNTER — Ambulatory Visit (INDEPENDENT_AMBULATORY_CARE_PROVIDER_SITE_OTHER): Payer: Medicare Other | Admitting: Internal Medicine

## 2018-12-21 DIAGNOSIS — I10 Essential (primary) hypertension: Secondary | ICD-10-CM

## 2018-12-21 DIAGNOSIS — G4700 Insomnia, unspecified: Secondary | ICD-10-CM

## 2018-12-21 DIAGNOSIS — I2510 Atherosclerotic heart disease of native coronary artery without angina pectoris: Secondary | ICD-10-CM

## 2018-12-21 DIAGNOSIS — R413 Other amnesia: Secondary | ICD-10-CM | POA: Diagnosis not present

## 2018-12-21 DIAGNOSIS — F4321 Adjustment disorder with depressed mood: Secondary | ICD-10-CM | POA: Diagnosis not present

## 2018-12-21 DIAGNOSIS — F0280 Dementia in other diseases classified elsewhere without behavioral disturbance: Secondary | ICD-10-CM | POA: Insufficient documentation

## 2018-12-21 DIAGNOSIS — M069 Rheumatoid arthritis, unspecified: Secondary | ICD-10-CM

## 2018-12-21 MED ORDER — ESCITALOPRAM OXALATE 5 MG PO TABS: 5.0000 mg | ORAL_TABLET | Freq: Every day | ORAL | 5 refills | Status: DC

## 2018-12-21 NOTE — Assessment & Plan Note (Signed)
Zetia, ASA, Toprol, Crestor 

## 2018-12-21 NOTE — Assessment & Plan Note (Addendum)
MCD vs early dementia  R/o Rx side effects aricept option discussed Treat depression - Lexapro Percocet, Ambient do not seem to bother

## 2018-12-21 NOTE — Assessment & Plan Note (Signed)
Zolpidem seems to work well

## 2018-12-21 NOTE — Progress Notes (Signed)
Subjective:  Patient ID: [name], male    DOB: 1933-07-03  Age: 85 y.o. MRN: [id number]  CC: No chief complaint on file.   HPI [name] presents for forgetfulness, moody, anger issues at times per [name] I'm frustrated... Not depressed  Outpatient Medications Prior to Visit  Medication Sig Dispense Refill  . Abatacept (ORENCIA Eldridge) Inject 1 Applicatorful into the skin See admin instructions. Every 4 weeks Next injection due 09-29-16    . acetaminophen (TYLENOL) 500 MG tablet Take 1,000 mg by mouth every 6 (six) hours as needed.    [name] aspirin EC 81 MG tablet Take 81 mg by mouth daily.    . Cholecalciferol (VITAMIN D PO) Take 1 tablet by mouth daily.    . Cyanocobalamin (B-12) 2500 MCG TABS Take 1 tablet by mouth daily.    . DULoxetine (CYMBALTA) 60 MG capsule TAKE ONE CAPSULE EVERY MORNING 90 capsule 3  . ELIQUIS 5 MG TABS tablet TAKE ONE TABLET TWICE DAILY 180 tablet 1  . Evolocumab (REPATHA SURECLICK) 341 MG/ML SOAJ Inject 1 pen into the skin every 14 (fourteen) days. 2 pen 11  . ezetimibe (ZETIA) 10 MG tablet TAKE ONE-HALF TABLET DAILY 45 tablet 3  . fexofenadine (ALLEGRA) 180 MG tablet Take 180 mg by mouth daily as needed for allergies or rhinitis.     [name] lisinopril (PRINIVIL,ZESTRIL) 5 MG tablet TAKE ONE TABLET DAILY 90 tablet 3  . LORazepam (ATIVAN) 0.5 MG tablet TAKE ONE OR TWO TABLETS TWICE DAILY AS NEEDED FOR ANXIETY 60 tablet 2  . metoprolol tartrate (LOPRESSOR) 25 MG tablet Take 25 mg by mouth 2 (two) times daily.    . nitroGLYCERIN (NITROSTAT) 0.4 MG SL tablet ONE TABLET UNDER TONGUE AS NEEDED FOR CHEST PAIN. MAY REPEAT IN 5 MINUTES AND AGAIN IN 5 MINUTES (TOTAL OF 3 TABLETS) 25 tablet 6  . omeprazole (PRILOSEC) 40 MG capsule TAKE ONE CAPSULE EACH DAY 90 capsule 3  . ondansetron (ZOFRAN) 4 MG tablet Take 1 tablet (4 mg total) by mouth every 8 (eight) hours as needed for nausea or vomiting. 20 tablet 0  . oxyCODONE-acetaminophen (PERCOCET/ROXICET) 5-325 MG tablet TAKE ONE  OR TWO TABLETS EVERY EIGHT HOURS AS NEEDED FOR MODERATE PAINOR SEVERE PAIN 90 tablet 0  . predniSONE (DELTASONE) 5 MG tablet Take 10 mg by mouth every morning.   0  . Propylene Glycol (SYSTANE BALANCE OP) Place 2 drops into both eyes daily as needed (dry eyes).     . rosuvastatin (CRESTOR) 5 MG tablet Take 1 tablet (5 mg total) by mouth daily. 90 tablet 3  . zolpidem (AMBIEN) 10 MG tablet TAKE 1/2 TO 1 TABLET AT BEDTIME AS NEEDED FOR SLEEP 30 tablet 3   No facility-administered medications prior to visit.     ROS: Review of Systems  Constitutional: Positive for fatigue. Negative for appetite change and unexpected weight change.  HENT: Negative for congestion, nosebleeds, sneezing, sore throat and trouble swallowing.   Eyes: Negative for itching and visual disturbance.  Respiratory: Negative for cough.   Cardiovascular: Negative for chest pain, palpitations and leg swelling.  Gastrointestinal: Negative for abdominal distention, blood in stool, diarrhea and nausea.  Genitourinary: Negative for frequency and hematuria.  Musculoskeletal: Positive for arthralgias, back pain and gait problem. Negative for joint swelling and neck pain.  Skin: Negative for rash.  Neurological: Negative for dizziness, tremors, speech difficulty and weakness.  Psychiatric/Behavioral: Positive for behavioral problems, decreased concentration and dysphoric mood. Negative for agitation, self-injury, sleep  disturbance and suicidal ideas. The patient is nervous/anxious.     Objective:  BP (!) 150/78 (BP Location: Right Arm, Patient Position: Sitting, Cuff Size: Normal)   Pulse 72   Temp (!) 97.3 F (36.3 C) (Oral)   Ht 6' (1.829 m)   Wt 188 lb (85.3 kg)   SpO2 97%   BMI 25.50 kg/m   BP Readings from Last 3 Encounters:  12/21/18 (!) 150/78  11/23/18 (!) 156/96  10/11/18 124/76    Wt Readings from Last 3 Encounters:  12/21/18 188 lb (85.3 kg)  11/23/18 185 lb (83.9 kg)  10/11/18 188 lb 12.8 oz (85.6 kg)     Physical Exam Constitutional:      General: He is not in acute distress.    Appearance: He is well-developed.     Comments: NAD  Eyes:     Conjunctiva/sclera: Conjunctivae normal.     Pupils: Pupils are equal, round, and reactive to light.  Neck:     Musculoskeletal: Normal range of motion.     Thyroid: No thyromegaly.     Vascular: No JVD.  Cardiovascular:     Rate and Rhythm: Normal rate and regular rhythm.     Heart sounds: Normal heart sounds. No murmur. No friction rub. No gallop.   Pulmonary:     Effort: Pulmonary effort is normal. No respiratory distress.     Breath sounds: Normal breath sounds. No wheezing or rales.  Chest:     Chest wall: No tenderness.  Abdominal:     General: Bowel sounds are normal. There is no distension.     Palpations: Abdomen is soft. There is no mass.     Tenderness: There is no abdominal tenderness. There is no guarding or rebound.  Musculoskeletal: Normal range of motion.        General: Tenderness present.  Lymphadenopathy:     Cervical: No cervical adenopathy.  Skin:    General: Skin is warm and dry.     Findings: No rash.  Neurological:     Mental Status: He is alert and oriented to person, place, and time.     Cranial Nerves: No cranial nerve deficit.     Motor: No abnormal muscle tone.     Coordination: Coordination abnormal.     Gait: Gait abnormal.     Deep Tendon Reflexes: Reflexes are normal and symmetric.  Psychiatric:        Behavior: Behavior normal.        Thought Content: Thought content normal.        Judgment: Judgment normal.   arthritic gait  Lab Results  Component Value Date   WBC 5.1 10/05/2018   HGB 14.1 10/05/2018   HCT 41.2 10/05/2018   PLT 290 10/05/2018   GLUCOSE 104 (H) 10/19/2018   CHOL 166 10/19/2018   TRIG 125 10/19/2018   HDL 63 10/19/2018   LDLDIRECT 168.9 05/24/2013   LDLCALC 78 10/19/2018   ALT 15 10/19/2018   AST 16 10/19/2018   NA 139 10/19/2018   K 4.4 10/19/2018   CL 103  10/19/2018   CREATININE 0.81 10/19/2018   BUN 15 10/19/2018   CO2 23 10/19/2018   TSH 1.18 12/13/2017   PSA 2.46 02/21/2017   INR 0.92 09/13/2016   HGBA1C 6.4 08/30/2017    Ct Angio Chest Pe W/cm &/or Wo Cm  Result Date: 04/01/2018 CLINICAL DATA:  Syncope, palpitations EXAM: CT ANGIOGRAPHY CHEST WITH CONTRAST TECHNIQUE: Multidetector CT imaging of the chest was performed using  the standard protocol during bolus administration of intravenous contrast. Multiplanar CT image reconstructions and MIPs were obtained to evaluate the vascular anatomy. CONTRAST:  121mL ISOVUE-370 IOPAMIDOL (ISOVUE-370) INJECTION 76% COMPARISON:  None. FINDINGS: Cardiovascular: No filling defects in the pulmonary arteries to suggest pulmonary emboli. Diffuse coronary artery calcifications throughout all 3 major coronary vessels and the branches. Moderate aortic calcifications. No aneurysm or dissection. Mediastinum/Nodes: No mediastinal, hilar, or axillary adenopathy. Trachea and esophagus are unremarkable. Lungs/Pleura: Subpleural nodule anteriorly in the right middle lobe measures 5 mm on image 79 no confluent airspace opacities. Dependent atelectasis. No effusions. Upper Abdomen: Imaging into the upper abdomen shows no acute findings. Musculoskeletal: Chest wall soft tissues are unremarkable. No acute bony abnormality. Review of the MIP images confirms the above findings. IMPRESSION: No evidence of pulmonary embolus. Diffuse severe coronary artery disease.  Aortic atherosclerosis. 5 mm right middle lobe subpleural nodule. No follow-up needed if patient is low-risk. Non-contrast chest CT can be considered in 12 months if patient is high-risk. This recommendation follows the consensus statement: Guidelines for Management of Incidental Pulmonary Nodules Detected on CT Images: From the Fleischner Society 2017; Radiology 2017; 284:228-243. Electronically Signed   By: Rolm Baptise M.D.   On: 04/01/2018 16:03    Assessment & Plan:    There are no diagnoses linked to this encounter.   No orders of the defined types were placed in this encounter.    Follow-up: No follow-ups on file.  [name], MD

## 2018-12-21 NOTE — Assessment & Plan Note (Signed)
Percocet prn  Potential benefits of a long term opioids use as well as potential risks (i.e. addiction risk, apnea etc) and complications (i.e. Somnolence, constipation and others) were explained to the patient and were aknowledged.  

## 2018-12-21 NOTE — Assessment & Plan Note (Signed)
Lisinopril, Toprol 

## 2018-12-21 NOTE — Assessment & Plan Note (Addendum)
Lexapro 5 mg

## 2018-12-25 ENCOUNTER — Other Ambulatory Visit: Payer: Medicare Other

## 2018-12-25 ENCOUNTER — Ambulatory Visit: Payer: Medicare Other | Admitting: Internal Medicine

## 2018-12-25 DIAGNOSIS — M0609 Rheumatoid arthritis without rheumatoid factor, multiple sites: Secondary | ICD-10-CM | POA: Diagnosis not present

## 2018-12-26 ENCOUNTER — Other Ambulatory Visit: Payer: Self-pay | Admitting: Internal Medicine

## 2018-12-28 ENCOUNTER — Telehealth: Payer: Self-pay | Admitting: Pharmacist

## 2018-12-28 DIAGNOSIS — E782 Mixed hyperlipidemia: Secondary | ICD-10-CM

## 2018-12-28 NOTE — Telephone Encounter (Signed)
Patient returned call, rescheduled labs for 2/14 AM.

## 2018-12-28 NOTE — Telephone Encounter (Signed)
Pt missed lab appt to recheck lipids on Repatha. LMOM for pt to reschedule.

## 2018-12-28 NOTE — Addendum Note (Signed)
Addended by: Dominyck Reser E on: 12/28/2018 09:29 AM   Modules accepted: Orders

## 2018-12-29 ENCOUNTER — Other Ambulatory Visit: Payer: Medicare Other | Admitting: *Deleted

## 2018-12-29 DIAGNOSIS — E782 Mixed hyperlipidemia: Secondary | ICD-10-CM

## 2018-12-29 LAB — LIPID PANEL
Chol/HDL Ratio: 4.3 ratio (ref 0.0–5.0)
Cholesterol, Total: 190 mg/dL (ref 100–199)
HDL: 44 mg/dL (ref >39)
LDL Calculated: 98 mg/dL (ref 0–99)
Triglycerides: 239 mg/dL — ABNORMAL HIGH (ref 0–149)
VLDL Cholesterol Cal: 48 mg/dL — ABNORMAL HIGH (ref 5–40)

## 2018-12-29 LAB — HEPATIC FUNCTION PANEL
ALT: 14 IU/L (ref 0–44)
AST: 16 IU/L (ref 0–40)
Albumin: 4.1 g/dL (ref 3.6–4.6)
Alkaline Phosphatase: 60 IU/L (ref 39–117)
Bilirubin Total: 0.5 mg/dL (ref 0.0–1.2)
Bilirubin, Direct: 0.12 mg/dL (ref 0.00–0.40)
Total Protein: 6.9 g/dL (ref 6.0–8.5)

## 2019-01-01 NOTE — Telephone Encounter (Signed)
Spoke with pt. He states he never started Repatha because he did not remember that he was supposed to. Spoke with him and his wife and advised him that Repatha rx was sent to his pharmacy 6 weeks ago and he was instructed to start injections every 2 weeks. He states he will pick this up today. Will also continue low dose rosuvastatin 5mg  daily. Advised pt to call clinic once he starts on injections so that we can schedule f/u lab work.

## 2019-01-04 DIAGNOSIS — L821 Other seborrheic keratosis: Secondary | ICD-10-CM | POA: Diagnosis not present

## 2019-01-04 DIAGNOSIS — L812 Freckles: Secondary | ICD-10-CM | POA: Diagnosis not present

## 2019-01-04 DIAGNOSIS — Z85828 Personal history of other malignant neoplasm of skin: Secondary | ICD-10-CM | POA: Diagnosis not present

## 2019-01-04 DIAGNOSIS — C44722 Squamous cell carcinoma of skin of right lower limb, including hip: Secondary | ICD-10-CM | POA: Diagnosis not present

## 2019-01-04 DIAGNOSIS — D485 Neoplasm of uncertain behavior of skin: Secondary | ICD-10-CM | POA: Diagnosis not present

## 2019-01-04 DIAGNOSIS — D1801 Hemangioma of skin and subcutaneous tissue: Secondary | ICD-10-CM | POA: Diagnosis not present

## 2019-01-04 DIAGNOSIS — L570 Actinic keratosis: Secondary | ICD-10-CM | POA: Diagnosis not present

## 2019-01-06 ENCOUNTER — Other Ambulatory Visit: Payer: Self-pay | Admitting: Internal Medicine

## 2019-01-17 DIAGNOSIS — M0609 Rheumatoid arthritis without rheumatoid factor, multiple sites: Secondary | ICD-10-CM | POA: Diagnosis not present

## 2019-01-17 DIAGNOSIS — Z6825 Body mass index (BMI) 25.0-25.9, adult: Secondary | ICD-10-CM | POA: Diagnosis not present

## 2019-01-17 DIAGNOSIS — M5030 Other cervical disc degeneration, unspecified cervical region: Secondary | ICD-10-CM | POA: Diagnosis not present

## 2019-01-17 DIAGNOSIS — E663 Overweight: Secondary | ICD-10-CM | POA: Diagnosis not present

## 2019-01-22 DIAGNOSIS — M0609 Rheumatoid arthritis without rheumatoid factor, multiple sites: Secondary | ICD-10-CM | POA: Diagnosis not present

## 2019-02-05 ENCOUNTER — Encounter: Payer: Self-pay | Admitting: Internal Medicine

## 2019-02-05 ENCOUNTER — Other Ambulatory Visit: Payer: Self-pay

## 2019-02-05 ENCOUNTER — Ambulatory Visit (INDEPENDENT_AMBULATORY_CARE_PROVIDER_SITE_OTHER): Payer: Medicare Other | Admitting: Internal Medicine

## 2019-02-05 DIAGNOSIS — I2510 Atherosclerotic heart disease of native coronary artery without angina pectoris: Secondary | ICD-10-CM

## 2019-02-05 DIAGNOSIS — M5440 Lumbago with sciatica, unspecified side: Secondary | ICD-10-CM

## 2019-02-05 DIAGNOSIS — F4321 Adjustment disorder with depressed mood: Secondary | ICD-10-CM

## 2019-02-05 DIAGNOSIS — M069 Rheumatoid arthritis, unspecified: Secondary | ICD-10-CM | POA: Diagnosis not present

## 2019-02-05 MED ORDER — OXYCODONE-ACETAMINOPHEN 5-325 MG PO TABS: 1.0000 | ORAL_TABLET | Freq: Three times a day (TID) | ORAL | 0 refills | Status: DC | PRN

## 2019-02-05 MED ORDER — ESCITALOPRAM OXALATE 10 MG PO TABS: 10.0000 mg | ORAL_TABLET | Freq: Every day | ORAL | 3 refills | Status: DC

## 2019-02-05 NOTE — Assessment & Plan Note (Signed)
Oxycodone prn - Rx given

## 2019-02-05 NOTE — Assessment & Plan Note (Signed)
Zetia, ASA, Toprol, Crestor 

## 2019-02-05 NOTE — Assessment & Plan Note (Signed)
Increase Lexapro to 10 mg/d

## 2019-02-05 NOTE — Progress Notes (Signed)
Subjective:  Patient ID: [name], male    DOB: [birth year]  Age: 85 y.o. MRN: [id number]  CC: No chief complaint on file.   HPI [name] presents for anxiety, RA, HTN, LBP Overall better on Lexapro  Outpatient Medications Prior to Visit  Medication Sig Dispense Refill  . Abatacept (ORENCIA [place]) Inject 1 Applicatorful into the skin See admin instructions. Every 4 weeks Next injection due 09-29-16    . acetaminophen (TYLENOL) 500 MG tablet Take 1,000 mg by mouth every 6 (six) hours as needed.    [name] aspirin EC 81 MG tablet Take 81 mg by mouth daily.    . Cholecalciferol (VITAMIN D PO) Take 1 tablet by mouth daily.    . Cyanocobalamin (B-12) 2500 MCG TABS Take 1 tablet by mouth daily.    . DULoxetine (CYMBALTA) 60 MG capsule TAKE ONE CAPSULE EVERY MORNING 90 capsule 3  . ELIQUIS 5 MG TABS tablet TAKE ONE TABLET TWICE DAILY 180 tablet 1  . escitalopram (LEXAPRO) 5 MG tablet Take 1 tablet (5 mg total) by mouth daily. 30 tablet 5  . Evolocumab (REPATHA SURECLICK) 413 MG/ML SOAJ Inject 1 pen into the skin every 14 (fourteen) days. 2 pen 11  . ezetimibe (ZETIA) 10 MG tablet TAKE ONE-HALF TABLET DAILY 45 tablet 3  . fexofenadine (ALLEGRA) 180 MG tablet Take 180 mg by mouth daily as needed for allergies or rhinitis.     [name] lisinopril (PRINIVIL,ZESTRIL) 5 MG tablet TAKE ONE TABLET DAILY 90 tablet 3  . LORazepam (ATIVAN) 0.5 MG tablet TAKE ONE OR TWO TABLETS TWICE DAILY AS NEEDED FOR ANXIETY 60 tablet 2  . metoprolol tartrate (LOPRESSOR) 25 MG tablet Take 1 tablet (25 mg total) by mouth 2 (two) times daily. 180 tablet 2  . nitroGLYCERIN (NITROSTAT) 0.4 MG SL tablet ONE TABLET UNDER TONGUE AS NEEDED FOR CHEST PAIN. MAY REPEAT IN 5 MINUTES AND AGAIN IN 5 MINUTES (TOTAL OF 3 TABLETS) 25 tablet 6  . omeprazole (PRILOSEC) 40 MG capsule TAKE ONE CAPSULE EACH DAY 90 capsule 3  . ondansetron (ZOFRAN) 4 MG tablet Take 1 tablet (4 mg total) by mouth every 8 (eight) hours as needed for nausea or  vomiting. 20 tablet 0  . oxyCODONE-acetaminophen (PERCOCET/ROXICET) 5-325 MG tablet TAKE ONE OR TWO TABLETS EVERY EIGHT HOURS AS NEEDED FOR MODERATE PAINOR SEVERE PAIN 90 tablet 0  . predniSONE (DELTASONE) 5 MG tablet Take 10 mg by mouth every morning.   0  . Propylene Glycol (SYSTANE BALANCE OP) Place 2 drops into both eyes daily as needed (dry eyes).     . rosuvastatin (CRESTOR) 5 MG tablet Take 1 tablet (5 mg total) by mouth daily. 90 tablet 3  . zolpidem (AMBIEN) 10 MG tablet TAKE 1/2 TO 1 TABLET AT BEDTIME AS NEEDED FOR SLEEP 30 tablet 3   No facility-administered medications prior to visit.     ROS: Review of Systems  Constitutional: Negative for appetite change, fatigue and unexpected weight change.  HENT: Negative for congestion, nosebleeds, sneezing, sore throat and trouble swallowing.   Eyes: Negative for itching and visual disturbance.  Respiratory: Negative for cough.   Cardiovascular: Negative for chest pain, palpitations and leg swelling.  Gastrointestinal: Negative for abdominal distention, blood in stool, diarrhea and nausea.  Genitourinary: Negative for frequency and hematuria.  Musculoskeletal: Positive for arthralgias and back pain. Negative for gait problem, joint swelling and neck pain.  Skin: Negative for rash.  Neurological: Negative for dizziness, tremors, speech difficulty and  weakness.  Psychiatric/Behavioral: Positive for decreased concentration and sleep disturbance. Negative for agitation, dysphoric mood and suicidal ideas. The patient is not nervous/anxious.     Objective:  BP 134/76 (BP Location: Left Arm, Patient Position: Sitting, Cuff Size: Normal)   Pulse 73   Temp 97.8 F (36.6 C) (Oral)   Ht 6' (1.829 m)   Wt 188 lb (85.3 kg)   SpO2 97%   BMI 25.50 kg/m   BP Readings from Last 3 Encounters:  02/05/19 134/76  12/21/18 (!) 150/78  11/23/18 (!) 156/96    Wt Readings from Last 3 Encounters:  02/05/19 188 lb (85.3 kg)  12/21/18 188 lb (85.3  kg)  11/23/18 185 lb (83.9 kg)    Physical Exam Constitutional:      General: He is not in acute distress.    Appearance: He is well-developed.     Comments: NAD  Eyes:     Conjunctiva/sclera: Conjunctivae normal.     Pupils: Pupils are equal, round, and reactive to light.  Neck:     Musculoskeletal: Normal range of motion.     Thyroid: No thyromegaly.     Vascular: No JVD.  Cardiovascular:     Rate and Rhythm: Normal rate and regular rhythm.     Heart sounds: Normal heart sounds. No murmur. No friction rub. No gallop.   Pulmonary:     Effort: Pulmonary effort is normal. No respiratory distress.     Breath sounds: Normal breath sounds. No wheezing or rales.  Chest:     Chest wall: No tenderness.  Abdominal:     General: Bowel sounds are normal. There is no distension.     Palpations: Abdomen is soft. There is no mass.     Tenderness: There is no abdominal tenderness. There is no guarding or rebound.  Musculoskeletal: Normal range of motion.        General: Tenderness present.  Lymphadenopathy:     Cervical: No cervical adenopathy.  Skin:    General: Skin is warm and dry.     Findings: Lesion present. No rash.  Neurological:     Mental Status: He is alert and oriented to person, place, and time.     Cranial Nerves: No cranial nerve deficit.     Motor: No abnormal muscle tone.     Coordination: Coordination normal.     Gait: Gait normal.     Deep Tendon Reflexes: Reflexes are normal and symmetric.  Psychiatric:        Behavior: Behavior normal.        Thought Content: Thought content normal.        Judgment: Judgment normal.   L leg skin growth LS tender  Lab Results  Component Value Date   WBC 5.1 10/05/2018   HGB 14.1 10/05/2018   HCT 41.2 10/05/2018   PLT 290 10/05/2018   GLUCOSE 104 (H) 10/19/2018   CHOL 190 12/29/2018   TRIG 239 (H) 12/29/2018   HDL 44 12/29/2018   LDLDIRECT 168.9 05/24/2013   LDLCALC 98 12/29/2018   ALT 14 12/29/2018   AST 16  12/29/2018   NA 139 10/19/2018   K 4.4 10/19/2018   CL 103 10/19/2018   CREATININE 0.81 10/19/2018   BUN 15 10/19/2018   CO2 23 10/19/2018   TSH 1.18 12/13/2017   PSA 2.46 02/21/2017   INR 0.92 09/13/2016   HGBA1C 6.4 08/30/2017    Ct Angio Chest Pe W/cm &/or Wo Cm  Result Date: 04/01/2018 CLINICAL DATA:  Syncope, palpitations EXAM: CT ANGIOGRAPHY CHEST WITH CONTRAST TECHNIQUE: Multidetector CT imaging of the chest was performed using the standard protocol during bolus administration of intravenous contrast. Multiplanar CT image reconstructions and MIPs were obtained to evaluate the vascular anatomy. CONTRAST:  151mL ISOVUE-370 IOPAMIDOL (ISOVUE-370) INJECTION 76% COMPARISON:  None. FINDINGS: Cardiovascular: No filling defects in the pulmonary arteries to suggest pulmonary emboli. Diffuse coronary artery calcifications throughout all 3 major coronary vessels and the branches. Moderate aortic calcifications. No aneurysm or dissection. Mediastinum/Nodes: No mediastinal, hilar, or axillary adenopathy. Trachea and esophagus are unremarkable. Lungs/Pleura: Subpleural nodule anteriorly in the right middle lobe measures 5 mm on image 79 no confluent airspace opacities. Dependent atelectasis. No effusions. Upper Abdomen: Imaging into the upper abdomen shows no acute findings. Musculoskeletal: Chest wall soft tissues are unremarkable. No acute bony abnormality. Review of the MIP images confirms the above findings. IMPRESSION: No evidence of pulmonary embolus. Diffuse severe coronary artery disease.  Aortic atherosclerosis. 5 mm right middle lobe subpleural nodule. No follow-up needed if patient is low-risk. Non-contrast chest CT can be considered in 12 months if patient is high-risk. This recommendation follows the consensus statement: Guidelines for Management of Incidental Pulmonary Nodules Detected on CT Images: From the Fleischner Society 2017; Radiology 2017; 284:228-243. Electronically Signed   By: Rolm Baptise M.D.   On: 04/01/2018 16:03    Assessment & Plan:   There are no diagnoses linked to this encounter.   No orders of the defined types were placed in this encounter.    Follow-up: No follow-ups on file.  [name], MD

## 2019-02-05 NOTE — Assessment & Plan Note (Signed)
Percocet prn  Potential benefits of a long term opioids use as well as potential risks (i.e. addiction risk, apnea etc) and complications (i.e. Somnolence, constipation and others) were explained to the patient and were aknowledged.  

## 2019-02-08 DIAGNOSIS — Z85828 Personal history of other malignant neoplasm of skin: Secondary | ICD-10-CM | POA: Diagnosis not present

## 2019-02-08 DIAGNOSIS — C44722 Squamous cell carcinoma of skin of right lower limb, including hip: Secondary | ICD-10-CM | POA: Diagnosis not present

## 2019-02-19 DIAGNOSIS — M0609 Rheumatoid arthritis without rheumatoid factor, multiple sites: Secondary | ICD-10-CM | POA: Diagnosis not present

## 2019-02-21 DIAGNOSIS — L570 Actinic keratosis: Secondary | ICD-10-CM | POA: Diagnosis not present

## 2019-03-02 ENCOUNTER — Telehealth: Payer: Self-pay

## 2019-03-02 NOTE — Telephone Encounter (Signed)
Left message regarding appt on 03/05/19.

## 2019-03-03 ENCOUNTER — Other Ambulatory Visit: Payer: Self-pay | Admitting: Internal Medicine

## 2019-03-05 ENCOUNTER — Encounter: Payer: Self-pay | Admitting: Internal Medicine

## 2019-03-05 ENCOUNTER — Telehealth: Payer: Self-pay | Admitting: Internal Medicine

## 2019-03-05 ENCOUNTER — Telehealth (INDEPENDENT_AMBULATORY_CARE_PROVIDER_SITE_OTHER): Payer: Medicare Other | Admitting: Internal Medicine

## 2019-03-05 DIAGNOSIS — G4733 Obstructive sleep apnea (adult) (pediatric): Secondary | ICD-10-CM | POA: Diagnosis not present

## 2019-03-05 DIAGNOSIS — I480 Paroxysmal atrial fibrillation: Secondary | ICD-10-CM

## 2019-03-05 DIAGNOSIS — I2510 Atherosclerotic heart disease of native coronary artery without angina pectoris: Secondary | ICD-10-CM

## 2019-03-05 DIAGNOSIS — I10 Essential (primary) hypertension: Secondary | ICD-10-CM | POA: Diagnosis not present

## 2019-03-05 DIAGNOSIS — E782 Mixed hyperlipidemia: Secondary | ICD-10-CM

## 2019-03-05 NOTE — Telephone Encounter (Signed)
Returned call to Pt.  Walked Pt through getting on MyChart video visit.  Pt will try to log in now.

## 2019-03-05 NOTE — Progress Notes (Signed)
Electrophysiology TeleHealth Note   Due to national recommendations of social distancing due to COVID 19, an audio/video telehealth visit is felt to be most appropriate for this patient at this time.  See MyChart message from today for the patient's consent to telehealth for [facility].   Date:  03/05/2019   ID:  Evan, Todd 16-Aug-1933, MRN [id number]  Location: patient's home  Provider location: [address] [place]  Evaluation Performed: Follow-up visit  PCP:  Plotnikov, Evie Lacks, MD  Cardiologist:  Fransico Him, MD  Electrophysiologist:  Dr Rayann Heman  Chief Complaint:  CAD  History of Present Illness:    [name] is a 85 y.o. male who presents via audio/video conferencing for a telehealth visit today.  Since last being seen in our clinic, the patient reports doing very well. He is staying active.  + myalgias (mild) from time to time.  He attributes to his RA.  His family notes that he has had increasing pain. Today, he denies symptoms of palpitations, chest pain, shortness of breath,  lower extremity edema, dizziness, presyncope, or syncope.  The patient is otherwise without complaint today.  The patient denies symptoms of fevers, chills, cough, or new SOB worrisome for COVID 19.  Past Medical History:  Diagnosis Date  . CAD (coronary artery disease)    a. s/p inferior STEMI 01/08/2014; LHC 01/08/14: total RCA occlusion s/p 3.5x75mm Xience DES distal RCA and 3.5x28 mm DES mid RCA, 60-70% mid LAD stenosis, EF 55%.  . Complication of anesthesia    'long to wake up after back surgery " 09/2003  . Diverticulosis of colon   . GERD (gastroesophageal reflux disease)   . History of cellulitis    05-26-2015  LLE  . History of colon polyps    1998- benign/  2008 adenomatous   . History of kidney stones    2013  . History of squamous cell carcinoma excision    2013;  2015;  06-12-2015 right leg/  02/ and 05/ 2017  left ear and left leg  . Hydronephrosis,  left   . Hyperlipidemia   . Hypertension   . Migraine with aura   . OSA on CPAP 06/19/2015   Moderate OSA with AHI 18/hr  per study 05-20-2015  . Osteoarthritis   . Paroxysmal atrial fibrillation ([place]) 4/16   chads2vasc score is at least 4  . Premature atrial contractions   . RA (rheumatoid arthritis) [facility])    rheumatologist-  dr Leigh Aurora  . Sinusitis, chronic 10/17/2015  . Wears glasses   . Wears hearing aid    bilateral    Past Surgical History:  Procedure Laterality Date  . BACK SURGERY    . CARDIOVASCULAR STRESS TEST  11/21/2015   Low risk nuclear study w/ a small diaphragmatic attenuation artifact, no ischemia/  normal LV function and wall motion , ef 63%  . CATARACT EXTRACTION W/ INTRAOCULAR LENS  IMPLANT, BILATERAL  2015  . COLONOSCOPY  last one 06-09-2011  . CORONARY ANGIOPLASTY    . CYSTOSCOPY W/ RETROGRADES Left 07/12/2016   Procedure: CYSTOSCOPY WITH RETROGRADE PYELOGRAM LEFT URETERAL STENT;  Surgeon: Irine Seal, MD;  Location: [facility];  Service: Urology;  Laterality: Left;  . CYSTOSCOPY WITH RETROGRADE PYELOGRAM, URETEROSCOPY AND STENT PLACEMENT Left 08/11/2016   Procedure: CYSTOSCOPY WITH RETROGRADE PYELOGRAM,  DIAGNOSTIC URETEROSCOPY , STENT EXCHANGE;  Surgeon: Alexis Frock, MD;  Location: [facility];  Service: Urology;  Laterality: Left;  .  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   severe fibromatosis palm and fingers  . LEFT HEART CATHETERIZATION WITH CORONARY ANGIOGRAM N/A 01/08/2014   Procedure: LEFT HEART CATHETERIZATION WITH CORONARY ANGIOGRAM;  Surgeon: Troy Sine, MD;  Location: [facility] CATH LAB;  Service: Cardiovascular;  Laterality:N/A;  total/ subtotal RCA/  mLAD 10-25% w/ mid systolic bridging/  preserved global LVF, ef 55%  . MOHS SURGERY  x2  feb and may 2017   left ear /  left leg  ([facility])  . PERCUTANEOUS CORONARY STENT INTERVENTION (PCI-S)  01/08/2014   Procedure: PERCUTANEOUS CORONARY STENT INTERVENTION (PCI-S);  Surgeon: Troy Sine, MD;  Location: [facility] CATH LAB;  Service: Cardiovascular;;  DES to mid and distal RCA  . POSTERIOR LUMBAR FUSION  10/01/2003   and Laminectomy/ diskectomy  L4 -- S1  . ROBOT ASSISTED PYELOPLASTY Left 09/15/2016   Procedure: XI ROBOTIC ASSISTED PYELOPLASTY;  Surgeon: Alexis Frock, MD;  Location: [facility];  Service: Urology;  Laterality: Left;  . TONSILLECTOMY    . TRANSTHORACIC ECHOCARDIOGRAM  02/23/2016   mild LVH,  ef 50-55%,  grade 1 diastolic dysfunction/  mild to moderate AV calcification w/ no stenosis or regurg./  trivial MR and TR/  mild PR    Current Outpatient Medications  Medication Sig Dispense Refill  . Abatacept (ORENCIA [place]) Inject 1 Applicatorful into the skin See admin instructions. Every 4 weeks Next injection due 09-29-16    . acetaminophen (TYLENOL) 500 MG tablet Take 1,000 mg by mouth every 6 (six) hours as needed.    [name] aspirin EC 81 MG tablet Take 81 mg by mouth daily.    . Cholecalciferol (VITAMIN D PO) Take 1 tablet by mouth daily.    . Cyanocobalamin (B-12) 2500 MCG TABS Take 1 tablet by mouth daily.    [name] ELIQUIS 5 MG TABS tablet TAKE ONE TABLET TWICE DAILY 180 tablet 1  . escitalopram (LEXAPRO) 10 MG tablet Take 1 tablet (10 mg total) by mouth daily. 90 tablet 3  . Evolocumab (REPATHA SURECLICK) 852 MG/ML SOAJ Inject 1 pen into the skin every 14 (fourteen) days. 2 pen 11  . ezetimibe (ZETIA) 10 MG tablet TAKE ONE-HALF TABLET DAILY 45 tablet 3  . fexofenadine (ALLEGRA) 180 MG tablet Take 180 mg by mouth daily as needed for allergies or rhinitis.     [name] lisinopril (PRINIVIL,ZESTRIL) 5 MG tablet TAKE ONE TABLET DAILY 90 tablet 3  . LORazepam (ATIVAN) 0.5 MG tablet TAKE ONE OR TWO TABLETS TWICE DAILY AS NEEDED FOR ANXIETY 60 tablet 2  . metoprolol tartrate (LOPRESSOR) 25 MG tablet Take 1 tablet (25 mg total) by mouth 2 (two) times daily. 180 tablet 2  . nitroGLYCERIN (NITROSTAT) 0.4 MG SL tablet ONE TABLET UNDER TONGUE AS NEEDED FOR CHEST PAIN. MAY REPEAT IN 5 MINUTES  AND AGAIN IN 5 MINUTES (TOTAL OF 3 TABLETS) 25 tablet 6  . omeprazole (PRILOSEC) 40 MG capsule TAKE ONE CAPSULE EACH DAY 90 capsule 3  . ondansetron (ZOFRAN) 4 MG tablet Take 1 tablet (4 mg total) by mouth every 8 (eight) hours as needed for nausea or vomiting. 20 tablet 0  . oxyCODONE-acetaminophen (PERCOCET/ROXICET) 5-325 MG tablet Take 1 tablet by mouth every 8 (eight) hours as needed for severe pain. 90 tablet 0  . predniSONE (DELTASONE) 5 MG tablet Take 10 mg by mouth every morning.   0  . Propylene Glycol (SYSTANE BALANCE OP) Place 2 drops into both eyes daily as needed (dry eyes).     [name]  zolpidem (AMBIEN) 10 MG tablet TAKE 1/2 TO 1 TABLET AT BEDTIME AS NEEDED FOR SLEEP 30 tablet 3  . DULoxetine (CYMBALTA) 60 MG capsule TAKE ONE CAPSULE EVERY MORNING (Patient not taking: Reported on 03/05/2019) 90 capsule 3  . rosuvastatin (CRESTOR) 5 MG tablet Take 1 tablet (5 mg total) by mouth daily. 90 tablet 3   No current facility-administered medications for this visit.     Allergies:   Methotrexate; Crestor [rosuvastatin calcium]; Lisinopril; Atorvastatin; and Pravastatin sodium   Social History:  The patient  reports that he quit smoking about 50 years ago. His smoking use included cigarettes. He quit after 10.00 years of use. He has never used smokeless tobacco. He reports current alcohol use of about 2.0 standard drinks of alcohol per week. He reports that he does not use drugs.   Family History:  The patient's  family history includes Heart disease in his father; Hypertension in his mother.   ROS:  Please see the history of present illness.   All other systems are personally reviewed and negative.    Exam:    Vital Signs:  There were no vitals taken for this visit.  Well appearing, alert and conversant, regular work of breathing,  good skin color Eyes- anicteric, neuro- grossly intact, skin- no apparent rash or lesions or cyanosis, mouth- oral mucosa is pink   Labs/Other Tests and Data  Reviewed:    Recent Labs: 10/05/2018: Hemoglobin 14.1; Platelets 290 10/19/2018: BUN 15; Creatinine, Ser 0.81; Potassium 4.4; Sodium 139 12/29/2018: ALT 14   Wt Readings from Last 3 Encounters:  02/05/19 188 lb (85.3 kg)  12/21/18 188 lb (85.3 kg)  11/23/18 185 lb (83.9 kg)     Other studies personally reviewed: Additional studies/ records that were reviewed today include: Megan Supples notes, my prior notes  Review of the above records today demonstrates: as above Prior radiographs: CTA 04/01/18- no PTE    ASSESSMENT & PLAN:    1.  CAD No ischemic symptoms Doing well with repatha Followed in the lipide clinic Could stop ASA if ok with Dr Radford Pax  2. Paroxysmal atrial fibrillation Well controlled On eliquis for chads2vasc score of 4  3. OSA Not using CPAP regularly I have encouraged compliance  4. HTN Stable No change required today  5. HL On repatha and zetia Followed in the lipid clinic  6. COVID 19 screen The patient denies symptoms of COVID 19 at this time.  The importance of social distancing was discussed today.  Follow-up:  12 months with me  Current medicines are reviewed at length with the patient today.   The patient does not have concerns regarding his medicines.  The following changes were made today:  none  Labs/ tests ordered today include:  No orders of the defined types were placed in this encounter.   Patient Risk:  after full review of this patients clinical status, I feel that they are at moderate to high risk at this time.  Today, I have spent 20 minutes with the patient with telehealth technology discussing CAD and afib .    Army Fossa, MD  03/05/2019 11:34 AM     [address] [place] [id number] [phone] (office) [phone] (fax)

## 2019-03-05 NOTE — Telephone Encounter (Signed)
Age 85, weight 85kg, Scr 0.81 on 10/19/18

## 2019-03-05 NOTE — Telephone Encounter (Signed)
  Patient stated he missed a call possibly in regards to his video visit

## 2019-03-19 ENCOUNTER — Ambulatory Visit: Payer: Medicare Other | Admitting: Internal Medicine

## 2019-03-19 DIAGNOSIS — M0609 Rheumatoid arthritis without rheumatoid factor, multiple sites: Secondary | ICD-10-CM | POA: Diagnosis not present

## 2019-04-02 ENCOUNTER — Telehealth: Payer: Self-pay | Admitting: Internal Medicine

## 2019-04-02 ENCOUNTER — Other Ambulatory Visit: Payer: Self-pay | Admitting: Internal Medicine

## 2019-04-02 NOTE — Telephone Encounter (Signed)
Please advise 

## 2019-04-02 NOTE — Telephone Encounter (Signed)
Copied from [place] [id number]. Topic: Referral - Request for Referral >> Apr 02, 2019  9:15 AM Scherrie Gerlach wrote: Wife calling to ask Dr [name] for more testing with the pt due to his dementia, which seems to be getting worse. They saw Dr in Feb and dr plotnikov mentioned doing further testing for him at that time.  Wife states something is going on with the pt, and she is concerned. She states they know dr plotnikov well and however he wants to address this is fine. They can come in, or do virtual Also states pt is sleeping a lot. He gets angry easily.please advise

## 2019-04-03 NOTE — Telephone Encounter (Signed)
Please schedule VOV

## 2019-04-03 NOTE — Telephone Encounter (Signed)
Ok VOV Thx

## 2019-04-05 ENCOUNTER — Encounter: Payer: Self-pay | Admitting: Internal Medicine

## 2019-04-05 ENCOUNTER — Ambulatory Visit (INDEPENDENT_AMBULATORY_CARE_PROVIDER_SITE_OTHER): Payer: Medicare Other | Admitting: Internal Medicine

## 2019-04-05 ENCOUNTER — Ambulatory Visit: Payer: Self-pay | Admitting: *Deleted

## 2019-04-05 DIAGNOSIS — G4700 Insomnia, unspecified: Secondary | ICD-10-CM | POA: Diagnosis not present

## 2019-04-05 DIAGNOSIS — R413 Other amnesia: Secondary | ICD-10-CM | POA: Diagnosis not present

## 2019-04-05 DIAGNOSIS — I2510 Atherosclerotic heart disease of native coronary artery without angina pectoris: Secondary | ICD-10-CM

## 2019-04-05 DIAGNOSIS — R202 Paresthesia of skin: Secondary | ICD-10-CM

## 2019-04-05 DIAGNOSIS — R042 Hemoptysis: Secondary | ICD-10-CM | POA: Insufficient documentation

## 2019-04-05 DIAGNOSIS — R410 Disorientation, unspecified: Secondary | ICD-10-CM | POA: Insufficient documentation

## 2019-04-05 DIAGNOSIS — J069 Acute upper respiratory infection, unspecified: Secondary | ICD-10-CM | POA: Diagnosis not present

## 2019-04-05 MED ORDER — DONEPEZIL HCL 5 MG PO TABS: 5.0000 mg | ORAL_TABLET | Freq: Every day | ORAL | 5 refills | Status: DC

## 2019-04-05 MED ORDER — AZITHROMYCIN 250 MG PO TABS: ORAL_TABLET | ORAL | 0 refills | Status: DC

## 2019-04-05 NOTE — Telephone Encounter (Signed)
Patient's wife calling. He has a virtual appointment at 1:40p today. Woke up spitting up blood today. Bright red about 1/2 if measured. Had root canal on Monday. Denies SOB/difficulty breathing/fever/CP/adb.pain. No nosebleeds/vomiting.No travels/No known positive contacts. No history of blood clots. Has mild sore throat. On 2 blood thinners, hold until after virtual with Dr. [name] also no aspirin at this time. Any shortness of breath or CP call 911 immediately. Stated she understood. Routing to PCP.  Reason for Disposition . [1] Coughed up blood AND [2] > 1 tablespoon (15 ml) (Exception: blood-tinged sputum)  Answer Assessment - Initial Assessment Questions 1. ONSET: "When did you start coughing up blood?"    Started this moring 2. SEVERITY: "How many times?" "How much blood?" (e.g., flecks, streaks, tablespoons, etc)     several 3. COUGHING SPASMS: "Did the blood appear after a coughing spell?"       no 4. RESPIRATORY DISTRESS: "Describe your breathing."      no 5. FEVER: "Do you have a fever?" If so, ask: "What is your temperature, how was it measured, and when did it start?"     no 6. SPUTUM: "Describe the color of your sputum" (clear, white, yellow, green), "Has there been any change recently?"     Bright red globs 7. CARDIAC HISTORY: "Do you have any history of heart disease?" (e.g., heart attack, congestive heart failure)      Yes, heart attack 3 years ago and htn 8. LUNG HISTORY: "Do you have any history of lung disease?"  (e.g., pulmonary embolus, asthma, emphysema)    no 9. PE RISK FACTORS: "Do you have a history of blood clots?" (or: recent major surgery, recent prolonged travel, bedridden)     Root canal on this past monday 10. OTHER SYMPTOMS: "Do you have any other symptoms?" (e.g., nosebleed, chest pain, abdominal pain, vomiting)      none 11. PREGNANCY: "Is there any chance you are pregnant?" "When was your last menstrual period?"      na 12. TRAVEL: "Have you traveled  out of the country in the last month?" (e.g., travel history, exposures)      no  Protocols used: COUGHING UP BLOOD-A-AH

## 2019-04-05 NOTE — Assessment & Plan Note (Signed)
Ambien, Lorazepam  - [name] will dispense

## 2019-04-05 NOTE — Telephone Encounter (Signed)
Patient scheduled.

## 2019-04-05 NOTE — Assessment & Plan Note (Signed)
x1 episode - ?due to URI vs other Zpac Chest CT To ER if reoccured

## 2019-04-05 NOTE — Progress Notes (Addendum)
Virtual Visit via Video Note  I connected with [name] on 04/05/19 at  1:40 PM EDT by a video enabled telemedicine application and verified that I am speaking with the correct person using two identifiers.   I discussed the limitations of evaluation and management by telemedicine and the availability of in person appointments. The patient expressed understanding and agreed to proceed.  History of Present Illness: We are having a video call with [name] and [name].  [name] had an episode of confusion the other night when he got dressed with 2 pairs of boxers and a sure that he put on backwards and was ready to go somewhere late at night.  He has pill boxes were scattered around his close. [name] had a root canal done on Monday.  He spent 4 hours in the dentist office. He developed sore throat and cough this morning.  He coughed up 1/4 cup of blood.  He thinks that blood came from his sinuses, however, it sounds more likely that bleeding was brought up by coughing.  He denies fever.  No chest pain.  There has been no runny nose,  shortness of breath, abdominal pain, diarrhea, constipation, arthralgias, skin rashes.   Observations/Objective: The patient appears to be in no acute distress, looks well.  He is eating tomato soup.  He is alert and cooperative.  Assessment and Plan: A complex case Go to ER if worse See my Assessment and Plan. Follow Up Instructions:    I discussed the assessment and treatment plan with the patient. The patient was provided an opportunity to ask questions and all were answered. The patient agreed with the plan and demonstrated an understanding of the instructions.   The patient was advised to call back or seek an in-person evaluation if the symptoms worsen or if the condition fails to improve as anticipated.  I provided face-to-face time during this encounter. We were at different locations.   Evan Kehr, MD

## 2019-04-05 NOTE — Assessment & Plan Note (Addendum)
Worse  Head CT Neurol ref Labs Percocet, Ambien, Lorazepam do not seem to bother - [name] will dispense

## 2019-04-05 NOTE — Assessment & Plan Note (Signed)
Zpac 

## 2019-04-05 NOTE — Telephone Encounter (Signed)
Noted. Thx.

## 2019-04-05 NOTE — Telephone Encounter (Signed)
FYI pt has virtual visit at 140

## 2019-04-05 NOTE — Assessment & Plan Note (Signed)
I asked [name] to take away [name]'s medication bottles with Percocet, lorazepam, zolpidem.  She will start dispensing these meds to [name]. Head CT Labs Aricept to start

## 2019-04-06 ENCOUNTER — Other Ambulatory Visit (INDEPENDENT_AMBULATORY_CARE_PROVIDER_SITE_OTHER): Payer: Medicare Other

## 2019-04-06 DIAGNOSIS — R202 Paresthesia of skin: Secondary | ICD-10-CM

## 2019-04-06 DIAGNOSIS — R410 Disorientation, unspecified: Secondary | ICD-10-CM

## 2019-04-06 DIAGNOSIS — R413 Other amnesia: Secondary | ICD-10-CM | POA: Diagnosis not present

## 2019-04-06 DIAGNOSIS — R042 Hemoptysis: Secondary | ICD-10-CM | POA: Diagnosis not present

## 2019-04-06 LAB — HEPATIC FUNCTION PANEL
ALT: 12 U/L (ref 0–53)
AST: 14 U/L (ref 0–37)
Albumin: 4.2 g/dL (ref 3.5–5.2)
Alkaline Phosphatase: 69 U/L (ref 39–117)
Bilirubin, Direct: 0.1 mg/dL (ref 0.0–0.3)
Total Bilirubin: 0.5 mg/dL (ref 0.2–1.2)
Total Protein: 7.9 g/dL (ref 6.0–8.3)

## 2019-04-06 LAB — VITAMIN B12: Vitamin B-12: 603 pg/mL (ref 211–911)

## 2019-04-06 LAB — URINALYSIS
Bilirubin Urine: NEGATIVE
Hgb urine dipstick: NEGATIVE
Leukocytes,Ua: NEGATIVE
Nitrite: NEGATIVE
Specific Gravity, Urine: 1.015 (ref 1.000–1.030)
Total Protein, Urine: NEGATIVE
Urine Glucose: NEGATIVE
Urobilinogen, UA: 1 (ref 0.0–1.0)
pH: 6 (ref 5.0–8.0)

## 2019-04-06 LAB — BASIC METABOLIC PANEL
BUN: 16 mg/dL (ref 6–23)
CO2: 26 mEq/L (ref 19–32)
Calcium: 9.6 mg/dL (ref 8.4–10.5)
Chloride: 105 mEq/L (ref 96–112)
Creatinine, Ser: 0.88 mg/dL (ref 0.40–1.50)
GFR: 82.58 mL/min (ref >60.00)
Glucose, Bld: 111 mg/dL — ABNORMAL HIGH (ref 70–99)
Potassium: 3.9 mEq/L (ref 3.5–5.1)
Sodium: 141 mEq/L (ref 135–145)

## 2019-04-06 LAB — CBC WITH DIFFERENTIAL/PLATELET
Basophils Absolute: 0 10*3/uL (ref 0.0–0.1)
Basophils Relative: 0.5 % (ref 0.0–3.0)
Eosinophils Absolute: 0.1 10*3/uL (ref 0.0–0.7)
Eosinophils Relative: 1 % (ref 0.0–5.0)
HCT: 42.4 % (ref 39.0–52.0)
Hemoglobin: 14.7 g/dL (ref 13.0–17.0)
Lymphocytes Relative: 13.9 % (ref 12.0–46.0)
Lymphs Abs: 1 10*3/uL (ref 0.7–4.0)
MCHC: 34.6 g/dL (ref 30.0–36.0)
MCV: 95.8 fl (ref 78.0–100.0)
Monocytes Absolute: 0.2 10*3/uL (ref 0.1–1.0)
Monocytes Relative: 3 % (ref 3.0–12.0)
Neutro Abs: 6 10*3/uL (ref 1.4–7.7)
Neutrophils Relative %: 81.6 % — ABNORMAL HIGH (ref 43.0–77.0)
Platelets: 323 10*3/uL (ref 150.0–400.0)
RBC: 4.43 Mil/uL (ref 4.22–5.81)
RDW: 13.8 % (ref 11.5–15.5)
WBC: 7.3 10*3/uL (ref 4.0–10.5)

## 2019-04-10 ENCOUNTER — Other Ambulatory Visit: Payer: Self-pay

## 2019-04-10 ENCOUNTER — Ambulatory Visit (INDEPENDENT_AMBULATORY_CARE_PROVIDER_SITE_OTHER)
Admission: RE | Admit: 2019-04-10 | Discharge: 2019-04-10 | Disposition: A | Discharge status: Home / Self Care | Payer: Medicare Other | Source: Ambulatory Visit | Attending: Internal Medicine | Admitting: Internal Medicine

## 2019-04-10 ENCOUNTER — Other Ambulatory Visit: Payer: Self-pay | Admitting: Internal Medicine

## 2019-04-10 DIAGNOSIS — R410 Disorientation, unspecified: Secondary | ICD-10-CM | POA: Diagnosis not present

## 2019-04-10 DIAGNOSIS — R042 Hemoptysis: Secondary | ICD-10-CM

## 2019-04-10 MED ORDER — IOHEXOL 300 MG/ML  SOLN
80.0000 mL | Freq: Once | INTRAMUSCULAR | Status: AC | PRN
  Administered 2019-04-10: 80 mL via INTRAVENOUS

## 2019-04-10 NOTE — Telephone Encounter (Signed)
[place] Controlled Database Checked Last filled: 03/20/19 # 30 LOV w/you: 04/05/19  Next appt w/you: 05/07/19

## 2019-04-11 ENCOUNTER — Telehealth (INDEPENDENT_AMBULATORY_CARE_PROVIDER_SITE_OTHER): Payer: Medicare Other | Admitting: Neurology

## 2019-04-11 VITALS — BP 145/80

## 2019-04-11 DIAGNOSIS — F0390 Unspecified dementia without behavioral disturbance: Secondary | ICD-10-CM

## 2019-04-11 DIAGNOSIS — F03A0 Unspecified dementia, mild, without behavioral disturbance, psychotic disturbance, mood disturbance, and anxiety: Secondary | ICD-10-CM

## 2019-04-11 MED ORDER — DONEPEZIL HCL 10 MG PO TABS: 10.0000 mg | ORAL_TABLET | Freq: Every day | ORAL | 3 refills | Status: DC

## 2019-04-11 NOTE — Progress Notes (Signed)
Virtual Visit via Video Note The purpose of this virtual visit is to provide medical care while limiting exposure to the novel coronavirus.    Consent was obtained for video visit:  Yes.   Answered questions that patient had about telehealth interaction:  Yes.   I discussed the limitations, risks, security and privacy concerns of performing an evaluation and management service by telemedicine. I also discussed with the patient that there may be a patient responsible charge related to this service. The patient expressed understanding and agreed to proceed.  Pt location: Home Physician Location: office Name of referring provider:  Plotnikov, Evie Lacks, MD I connected with [name] at patients initiation/request on 04/11/2019 at 83:30 AM EDT by video enabled telemedicine application and verified that I am speaking with the correct person using two identifiers. Pt MRN:  [id number] Pt DOB:  January 19, 1933 Video Participants:  [name];  [name] (spouse)   History of Present Illness:  This is an 85 year old right-handed man with a history of hypertension, hyperlipidemia, OSA on CPAP, CAD, presenting for evaluation of worsening memory. He feels his memory is okay, he gets confused some, "my wife tells me I forget things." He makes jokes several times during the visit. His wife started noticing changes the beginning of the year. He started having personality changes where he would just explode for no reason. He was started on Lexapro which has significantly helped. She has noticed short term memory issues, and was missing bills that she had to take over in January. He was also starting to miss medications, his wife has started helping. Last night he was very concerned that he had not taken his medications. He has gotten lost driving, last week he could not remember what errand he went to do and was driving around. He could not remember where he would be going, but then figures it out. He repeatedly  says he misplaced his hearing aids. His wife was driving yesterday and they brought her car to be serviced, after going to another place, he could not remember that they had brought her car in earlier. He had an unusual episode last week when he came upstairs and told his wife that they had to go and leave now, he was dressed in 2 pairs of boxer shorts and his shirt on backward (unusual, he is usually a fastidious dresser per wife), ready to go somewhere late at night. She found that he had pulled out his clothes and medications and wrapped his medications in his khaki pants. She directed him to bed and he went to sleep. He saw his PCP and had normal bloodwork and urinalysis. I personally reviewed head CT without contrast done 5/26 which did not show any acute changes, there was diffuse atrophy and chronic microvascular disease. His wife notes he is much more alert in the daytime and more confused at night. He was started on Donepezil 5mg  daily, which he is tolerating without side effects. There is no family history of dementia, no history of significant head injuries. He usually has 1-2 alcohol drinks at night.   He denies any headaches, dizziness (except upon standing quickly), diplopia, dysarthria/dysphagia, neck/back pain, focal numbness/tingling/weakness, bowel dysfunction, anosmia. He has infrequent incontinence. He has had right hand shaking occasionally around 3-4 times a week. He has a history of sleepwalking. His wife has not noticed any staring/unresponsive episodes. No paranoia or hallucinations. He has trouble sleeping and takes 1/2 tablet of Ambien. He has fallen  a coupld of times, last fall was 3 weeks ago.    PAST MEDICAL HISTORY: Past Medical History:  Diagnosis Date  . CAD (coronary artery disease)    a. s/p inferior STEMI 01/08/2014; LHC 01/08/14: total RCA occlusion s/p 3.5x101mm Xience DES distal RCA and 3.5x28 mm DES mid RCA, 60-70% mid LAD stenosis, EF 55%.  . Complication of  anesthesia    'long to wake up after back surgery " 09/2003  . Diverticulosis of colon   . GERD (gastroesophageal reflux disease)   . History of cellulitis    05-26-2015  LLE  . History of colon polyps    1998- benign/  2008 adenomatous   . History of kidney stones    2013  . History of squamous cell carcinoma excision    2013;  2015;  06-12-2015 right leg/  02/ and 05/ 2017  left ear and left leg  . Hydronephrosis, left   . Hyperlipidemia   . Hypertension   . Migraine with aura   . OSA on CPAP 06/19/2015   Moderate OSA with AHI 18/hr  per study 05-20-2015  . Osteoarthritis   . Paroxysmal atrial fibrillation ([place]) 4/16   chads2vasc score is at least 4  . Premature atrial contractions   . RA (rheumatoid arthritis) [facility])    rheumatologist-  dr Leigh Aurora  . Sinusitis, chronic 10/17/2015  . Wears glasses   . Wears hearing aid    bilateral    PAST SURGICAL HISTORY: Past Surgical History:  Procedure Laterality Date  . BACK SURGERY    . CARDIOVASCULAR STRESS TEST  11/21/2015   Low risk nuclear study w/ a small diaphragmatic attenuation artifact, no ischemia/  normal LV function and wall motion , ef 63%  . CATARACT EXTRACTION W/ INTRAOCULAR LENS  IMPLANT, BILATERAL  2015  . COLONOSCOPY  last one 06-09-2011  . CORONARY ANGIOPLASTY    . CYSTOSCOPY W/ RETROGRADES Left 07/12/2016   Procedure: CYSTOSCOPY WITH RETROGRADE PYELOGRAM LEFT URETERAL STENT;  Surgeon: Irine Seal, MD;  Location: [facility];  Service: Urology;  Laterality: Left;  . CYSTOSCOPY WITH RETROGRADE PYELOGRAM, URETEROSCOPY AND STENT PLACEMENT Left 08/11/2016   Procedure: CYSTOSCOPY WITH RETROGRADE PYELOGRAM,  DIAGNOSTIC URETEROSCOPY , STENT EXCHANGE;  Surgeon: Alexis Frock, MD;  Location[facility];  Service: Urology;  Laterality: Left;  . 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   severe fibromatosis palm and fingers  . LEFT HEART CATHETERIZATION WITH CORONARY ANGIOGRAM N/A 01/08/2014   Procedure:  LEFT HEART CATHETERIZATION WITH CORONARY ANGIOGRAM;  Surgeon: Troy Sine, MD;  Location: [facility] CATH LAB;  Service: Cardiovascular;  Laterality:N/A;  total/ subtotal RCA/  mLAD 16-10% w/ mid systolic bridging/  preserved global LVF, ef 55%  . MOHS SURGERY  x2  feb and may 2017   left ear /  left leg  ([facility])  . PERCUTANEOUS CORONARY STENT INTERVENTION (PCI-S)  01/08/2014   Procedure: PERCUTANEOUS CORONARY STENT INTERVENTION (PCI-S);  Surgeon: Troy Sine, MD;  Location: [facility] CATH LAB;  Service: Cardiovascular;;  DES to mid and distal RCA  . POSTERIOR LUMBAR FUSION  10/01/2003   and Laminectomy/ diskectomy  L4 -- S1  . ROBOT ASSISTED PYELOPLASTY Left 09/15/2016   Procedure: XI ROBOTIC ASSISTED PYELOPLASTY;  Surgeon: Alexis Frock, MD;  Location: [facility];  Service: Urology;  Laterality: Left;  . TONSILLECTOMY    . TRANSTHORACIC ECHOCARDIOGRAM  02/23/2016   mild LVH,  ef 50-55%,  grade 1 diastolic dysfunction/  mild to moderate AV calcification w/ no  stenosis or regurg./  trivial MR and TR/  mild PR    MEDICATIONS: Current Outpatient Medications on File Prior to Visit  Medication Sig Dispense Refill  . acetaminophen (TYLENOL) 500 MG tablet Take 1,000 mg by mouth every 6 (six) hours as needed.    [name] aspirin EC 81 MG tablet Take 81 mg by mouth daily.    [name] azithromycin (ZITHROMAX Z-PAK) 250 MG tablet As directed 6 tablet 0  . donepezil (ARICEPT) 5 MG tablet Take 1 tablet (5 mg total) by mouth at bedtime. 30 tablet 5  . ELIQUIS 5 MG TABS tablet TAKE ONE TABLET TWICE DAILY 180 tablet 1  . escitalopram (LEXAPRO) 10 MG tablet Take 1 tablet (10 mg total) by mouth daily. 90 tablet 3  . Evolocumab (REPATHA SURECLICK) 229 MG/ML SOAJ Inject 1 pen into the skin every 14 (fourteen) days. 2 pen 11  . ezetimibe (ZETIA) 10 MG tablet TAKE ONE-HALF TABLET DAILY 45 tablet 3  . fexofenadine (ALLEGRA) 180 MG tablet Take 180 mg by mouth daily as needed for allergies or rhinitis.     [name] lisinopril (PRINIVIL,ZESTRIL) 5 MG  tablet TAKE ONE TABLET DAILY 90 tablet 3  . LORazepam (ATIVAN) 0.5 MG tablet TAKE ONE OR TWO TABLETS TWICE DAILY AS NEEDED FOR ANXIETY 60 tablet 2  . metoprolol tartrate (LOPRESSOR) 25 MG tablet Take 1 tablet (25 mg total) by mouth 2 (two) times daily. (Patient taking differently: Take 25 mg by mouth daily. ) 180 tablet 2  . nitroGLYCERIN (NITROSTAT) 0.4 MG SL tablet ONE TABLET UNDER TONGUE AS NEEDED FOR CHEST PAIN. MAY REPEAT IN 5 MINUTES AND AGAIN IN 5 MINUTES (TOTAL OF 3 TABLETS) (Patient not taking: Reported on 04/10/2019) 25 tablet 6  . omeprazole (PRILOSEC) 40 MG capsule TAKE ONE CAPSULE EACH DAY 90 capsule 3  . ondansetron (ZOFRAN) 4 MG tablet Take 1 tablet (4 mg total) by mouth every 8 (eight) hours as needed for nausea or vomiting. 20 tablet 0  . oxyCODONE-acetaminophen (PERCOCET/ROXICET) 5-325 MG tablet Take 1 tablet by mouth every 8 (eight) hours as needed for severe pain. 90 tablet 0  . predniSONE (DELTASONE) 5 MG tablet Take 10 mg by mouth every morning.   0  . Propylene Glycol (SYSTANE BALANCE OP) Place 2 drops into both eyes daily as needed (dry eyes).     [name] zolpidem (AMBIEN) 10 MG tablet TAKE 1/2 TO 1 TABLET AT BEDTIME AS NEEDED FOR SLEEP 30 tablet 3   No current facility-administered medications on file prior to visit.     ALLERGIES: Allergies  Allergen Reactions  . Methotrexate Other (See Comments)    REACTION: tachycardia  . Crestor [Rosuvastatin Calcium]     Myalgias with 20mg  dose  . Lisinopril Other (See Comments)    cough  . Atorvastatin Other (See Comments)    Muscle pain, leg cramps  . Pravastatin Sodium Other (See Comments)    REACTION: cramps, fatigue    FAMILY HISTORY: Family History  Problem Relation Age of Onset  . Hypertension Mother   . Heart disease Father   . Colon cancer Neg Hx     Observations/Objective:   Vitals:   04/11/19 1042  BP: (!) 145/80   GEN:  The patient appears stated age and is in NAD.  Neurological examination: Patient is  awake, alert, oriented x 3. No aphasia or dysarthria. Intact fluency and comprehension. Remote and recent memory impaired. Able to name and repeat. Cranial nerves: Extraocular movements intact with no nystagmus. No facial  asymmetry. Motor: moves all extremities symmetrically, at least anti-gravity x 4. No incoordination on finger to nose testing. Gait: narrow-based and steady, able to tandem walk adequately. Negative Romberg test. No clear tremor noted on video today.  Montreal Cognitive Assessment  04/16/2019  Visuospatial/ Executive (0/5) 3  Naming (0/3) 3  Attention: Read list of digits (0/2) 2  Attention: Read list of letters (0/1) 1  Attention: Serial 7 subtraction starting at 100 (0/3) 3  Language: Repeat phrase (0/2) 1  Language : Fluency (0/1) 1  Abstraction (0/2) 2  Delayed Recall (0/5) 2  Orientation (0/6) 6  Total 24    Assessment and Plan:   This is a pleasant 85 year old right-handed man with a history of hypertension, hyperlipidemia, OSA on CPAP, CAD, presenting for evaluation of worsening memory. His MOCA score today is 24/30, however by history he is having more difficulties with complex tasks and has sundowning. I discussed the diagnosis of mild dementia with his wife. Etiology unclear, the episode last week suggestive of fluctuating cognition, which can be seen with all types of dementia, most commonly in dementia with Lewy Bodies. He does not have any other features of this. Fluctuating cognition can also be seen with Alzheimer's and vascular dementia. We discussed increasing Donepezil to 10mg  daily. Continue Lexapro. Discussed closely monitoring driving and agree with wife managing medications. The diagnosis and prognosis were discussed with patient and wife. Follow-up in 6 months, they know to call for any changes.  Follow Up Instructions:   -I discussed the assessment and treatment plan with the patient. The patient was provided an opportunity to ask questions and all were  answered. The patient agreed with the plan and demonstrated an understanding of the instructions.   The patient was advised to call back or seek an in-person evaluation if the symptoms worsen or if the condition fails to improve as anticipated.     Cameron Sprang, MD

## 2019-04-13 ENCOUNTER — Emergency Department (HOSPITAL_COMMUNITY)
Admission: EM | Admit: 2019-04-13 | Discharge: 2019-04-13 | Disposition: A | Discharge status: Home / Self Care | Payer: Medicare Other | Attending: Emergency Medicine | Admitting: Emergency Medicine

## 2019-04-13 ENCOUNTER — Other Ambulatory Visit: Payer: Self-pay

## 2019-04-13 ENCOUNTER — Emergency Department (HOSPITAL_COMMUNITY): Payer: Medicare Other

## 2019-04-13 ENCOUNTER — Encounter (HOSPITAL_COMMUNITY): Payer: Self-pay | Admitting: Emergency Medicine

## 2019-04-13 ENCOUNTER — Telehealth: Payer: Self-pay | Admitting: Internal Medicine

## 2019-04-13 DIAGNOSIS — R0902 Hypoxemia: Secondary | ICD-10-CM | POA: Diagnosis not present

## 2019-04-13 DIAGNOSIS — Z79899 Other long term (current) drug therapy: Secondary | ICD-10-CM | POA: Insufficient documentation

## 2019-04-13 DIAGNOSIS — Z87891 Personal history of nicotine dependence: Secondary | ICD-10-CM | POA: Diagnosis not present

## 2019-04-13 DIAGNOSIS — R112 Nausea with vomiting, unspecified: Secondary | ICD-10-CM | POA: Insufficient documentation

## 2019-04-13 DIAGNOSIS — R51 Headache: Secondary | ICD-10-CM | POA: Insufficient documentation

## 2019-04-13 DIAGNOSIS — R197 Diarrhea, unspecified: Secondary | ICD-10-CM | POA: Insufficient documentation

## 2019-04-13 DIAGNOSIS — I2510 Atherosclerotic heart disease of native coronary artery without angina pectoris: Secondary | ICD-10-CM | POA: Diagnosis not present

## 2019-04-13 DIAGNOSIS — Z7901 Long term (current) use of anticoagulants: Secondary | ICD-10-CM | POA: Diagnosis not present

## 2019-04-13 DIAGNOSIS — S0990XA Unspecified injury of head, initial encounter: Secondary | ICD-10-CM | POA: Diagnosis not present

## 2019-04-13 DIAGNOSIS — R519 Headache, unspecified: Secondary | ICD-10-CM

## 2019-04-13 DIAGNOSIS — I10 Essential (primary) hypertension: Secondary | ICD-10-CM | POA: Diagnosis not present

## 2019-04-13 DIAGNOSIS — R52 Pain, unspecified: Secondary | ICD-10-CM | POA: Diagnosis not present

## 2019-04-13 DIAGNOSIS — R001 Bradycardia, unspecified: Secondary | ICD-10-CM | POA: Diagnosis not present

## 2019-04-13 DIAGNOSIS — J80 Acute respiratory distress syndrome: Secondary | ICD-10-CM | POA: Diagnosis not present

## 2019-04-13 DIAGNOSIS — R4182 Altered mental status, unspecified: Secondary | ICD-10-CM | POA: Diagnosis present

## 2019-04-13 LAB — COMPREHENSIVE METABOLIC PANEL
ALT: 14 U/L (ref 0–44)
AST: 18 U/L (ref 15–41)
Albumin: 3.5 g/dL (ref 3.5–5.0)
Alkaline Phosphatase: 55 U/L (ref 38–126)
Anion gap: 13 (ref 5–15)
BUN: 26 mg/dL — ABNORMAL HIGH (ref 8–23)
CO2: 22 mmol/L (ref 22–32)
Calcium: 8.8 mg/dL — ABNORMAL LOW (ref 8.9–10.3)
Chloride: 106 mmol/L (ref 98–111)
Creatinine, Ser: 0.85 mg/dL (ref 0.61–1.24)
GFR calc Af Amer: >60 mL/min (ref >60)
GFR calc non Af Amer: >60 mL/min (ref >60)
Glucose, Bld: 131 mg/dL — ABNORMAL HIGH (ref 70–99)
Potassium: 3.1 mmol/L — ABNORMAL LOW (ref 3.5–5.1)
Sodium: 141 mmol/L (ref 135–145)
Total Bilirubin: 0.6 mg/dL (ref 0.3–1.2)
Total Protein: 6.2 g/dL — ABNORMAL LOW (ref 6.5–8.1)

## 2019-04-13 LAB — CBC WITH DIFFERENTIAL/PLATELET
Abs Immature Granulocytes: 0.03 10*3/uL (ref 0.00–0.07)
Basophils Absolute: 0 10*3/uL (ref 0.0–0.1)
Basophils Relative: 0 %
Eosinophils Absolute: 0.1 10*3/uL (ref 0.0–0.5)
Eosinophils Relative: 1 %
HCT: 40.1 % (ref 39.0–52.0)
Hemoglobin: 13.4 g/dL (ref 13.0–17.0)
Immature Granulocytes: 0 %
Lymphocytes Relative: 38 %
Lymphs Abs: 2.7 10*3/uL (ref 0.7–4.0)
MCH: 32.2 pg (ref 26.0–34.0)
MCHC: 33.4 g/dL (ref 30.0–36.0)
MCV: 96.4 fL (ref 80.0–100.0)
Monocytes Absolute: 0.6 10*3/uL (ref 0.1–1.0)
Monocytes Relative: 8 %
Neutro Abs: 3.8 10*3/uL (ref 1.7–7.7)
Neutrophils Relative %: 53 %
Platelets: 270 10*3/uL (ref 150–400)
RBC: 4.16 MIL/uL — ABNORMAL LOW (ref 4.22–5.81)
RDW: 13.5 % (ref 11.5–15.5)
WBC: 7.2 10*3/uL (ref 4.0–10.5)
nRBC: 0 % (ref 0.0–0.2)

## 2019-04-13 LAB — LIPASE, BLOOD: Lipase: 57 U/L — ABNORMAL HIGH (ref 11–51)

## 2019-04-13 MED ORDER — DIPHENHYDRAMINE HCL 50 MG/ML IJ SOLN
12.5000 mg | Freq: Once | INTRAMUSCULAR | Status: AC
  Administered 2019-04-13: 12.5 mg via INTRAVENOUS
  Filled 2019-04-13: qty 1

## 2019-04-13 MED ORDER — FENTANYL CITRATE (PF) 100 MCG/2ML IJ SOLN
25.0000 ug | Freq: Once | INTRAMUSCULAR | Status: AC
  Administered 2019-04-13: 25 ug via INTRAVENOUS
  Filled 2019-04-13: qty 2

## 2019-04-13 MED ORDER — FENTANYL CITRATE (PF) 100 MCG/2ML IJ SOLN
100.0000 ug | Freq: Once | INTRAMUSCULAR | Status: AC
  Administered 2019-04-13: 100 ug via INTRAVENOUS
  Filled 2019-04-13: qty 2

## 2019-04-13 MED ORDER — ONDANSETRON 4 MG PO TBDP: 4.0000 mg | ORAL_TABLET | Freq: Three times a day (TID) | ORAL | 0 refills | Status: DC | PRN

## 2019-04-13 MED ORDER — METOCLOPRAMIDE HCL 5 MG/ML IJ SOLN
10.0000 mg | Freq: Once | INTRAMUSCULAR | Status: AC
  Administered 2019-04-13: 10 mg via INTRAVENOUS
  Filled 2019-04-13: qty 2

## 2019-04-13 MED ORDER — SODIUM CHLORIDE 0.9 % IV BOLUS (SEPSIS)
1000.0000 mL | Freq: Once | INTRAVENOUS | Status: AC
  Administered 2019-04-13: 1000 mL via INTRAVENOUS

## 2019-04-13 MED ORDER — MORPHINE SULFATE (PF) 4 MG/ML IV SOLN
4.0000 mg | Freq: Once | INTRAVENOUS | Status: AC
  Administered 2019-04-13: 4 mg via INTRAVENOUS
  Filled 2019-04-13: qty 1

## 2019-04-13 NOTE — Discharge Instructions (Signed)
Your testing today has been reassuring, there is no signs of tumors bleeding or aneurysms in your brain, your blood work was reassuring, your symptoms have improved however I expect that you will still have some nausea vomiting or diarrhea over the next 24 hours.  Please drink plenty of clear fluids, return to the emergency department only for severe or worsening symptoms that are not resolving with taking Zofran every 6 hours as needed for nausea and drinking plenty of clear liquids.  Please see your doctor in 48 hours for recheck

## 2019-04-13 NOTE — Telephone Encounter (Signed)
Copied from [place] [id number]. Topic: General - Inquiry >> Apr 13, 2019 10:32 AM Mathis Bud wrote: Reason for CRM: Patient wife called stating Patient went to ER last night 5/29.  Patient was experiencing headaches, throwing up, diarrhea. Wife said he was really out of it so called 911 and rode in an ambulance.  Patient wife just wanted to update PCP. Patient wife would like a call back to set up hospital follow up. Wife call back # [phone]

## 2019-04-13 NOTE — ED Notes (Signed)
Bear Hugger placed on pt 

## 2019-04-13 NOTE — ED Notes (Signed)
Patient ambulated to the restroom with no assistance.

## 2019-04-13 NOTE — ED Triage Notes (Addendum)
Pt. BIB EMS d/t wife calling concerned by pts. Altered mental status. Wife states husband was up at 3:30a with NVD.  Throwing up x5 color brown with dark red blood and Diarrhea x2 waterly brown no blood. Per wife pt. Was Sweating, "ice cool" and very disoriented. Per Pt. "I feel sick as a dog but I dont know why i'm here."  Pt. Newly dx with dementia Last known well 9 pm

## 2019-04-13 NOTE — Telephone Encounter (Signed)
Please call with to schedule VOV. With symptoms pt cannot be seen in office.

## 2019-04-13 NOTE — Telephone Encounter (Signed)
LVM for patient to call back to make appt, if he still has symptoms appt will have to be virtual

## 2019-04-13 NOTE — ED Provider Notes (Signed)
[facility] EMERGENCY DEPARTMENT Provider Note   CSN: [id number] Arrival date & time: 04/13/19  0425    History   Chief Complaint Chief Complaint  Patient presents with  . Altered Mental Status  Level 5 caveat due to dementia  HPI SHARRON LANGSAM is a 85 y.o. male.     The history is provided by the patient and the spouse.  Emesis  Severity:  Moderate Timing:  Intermittent Progression:  Worsening Chronicity:  New Relieved by:  Nothing Worsened by:  Nothing Associated symptoms: chills, diarrhea and headaches   Associated symptoms: no cough   Patient with history of dementia presents from home for vomiting diarrhea.  Wife states patient was up at 3:30 in the morning with nausea vomiting diarrhea.  Is noted he had some blood in the vomit but stool was brown Wife reported he seem more altered than normal.  Patient was sweating and most "I school. Patient reports a headache.  Past Medical History:  Diagnosis Date  . CAD (coronary artery disease)    a. s/p inferior STEMI 01/08/2014; LHC 01/08/14: total RCA occlusion s/p 3.5x9mm Xience DES distal RCA and 3.5x28 mm DES mid RCA, 60-70% mid LAD stenosis, EF 55%.  . Complication of anesthesia    'long to wake up after back surgery " 09/2003  . Diverticulosis of colon   . GERD (gastroesophageal reflux disease)   . History of cellulitis    05-26-2015  LLE  . History of colon polyps    1998- benign/  2008 adenomatous   . History of kidney stones    2013  . History of squamous cell carcinoma excision    2013;  2015;  06-12-2015 right leg/  02/ and 05/ 2017  left ear and left leg  . Hydronephrosis, left   . Hyperlipidemia   . Hypertension   . Migraine with aura   . OSA on CPAP 06/19/2015   Moderate OSA with AHI 18/hr  per study 05-20-2015  . Osteoarthritis   . Paroxysmal atrial fibrillation (HCC) 4/16   chads2vasc score is at least 4  . Premature atrial contractions   . RA (rheumatoid arthritis) [facility])    rheumatologist-  dr Alben Deeds  . Sinusitis, chronic 10/17/2015  . Wears glasses   . Wears hearing aid    bilateral    Patient Active Problem List   Diagnosis Date Noted  . Hemoptysis 04/05/2019  . Confusion 04/05/2019  . Memory loss 12/21/2018  . Nausea & vomiting 02/07/2018  . Chills 02/07/2018  . Rash 02/07/2018  . Insomnia 09/22/2017  . Cervical spondylolysis 06/28/2017  . Hyperglycemia 06/22/2017  . Well adult exam 11/18/2016  . Hydronephrosis 11/18/2016  . UPJ obstruction, 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  . Allergic rhinitis 06/02/2016  . Asthma with exacerbation 06/02/2016  . Sinusitis, chronic 10/17/2015  . OSA (obstructive sleep apnea) 06/19/2015  . Cellulitis and abscess of leg, except foot 05/26/2015  . Paroxysmal atrial fibrillation (HCC) 02/21/2015  . CAP (community acquired pneumonia) 01/02/2015  . Upper respiratory infection 12/02/2014  . Vivid dream 09/25/2014  . Actinic keratoses 04/26/2014  . CAD (coronary artery disease) 02/25/2014  . STEMI (ST elevation myocardial infarction) (HCC) 01/08/2014  . Rheumatoid bursitis of left elbow (HCC) 08/08/2013  . Situational depression 06/18/2013  . Neck pain 04/23/2012  . Acute abdominal pain in left flank 02/14/2012  . Hypertension 09/23/2011  . Chest pain, midsternal 09/13/2011  . Mixed hyperlipidemia 07/23/2010  . Palpitations 07/07/2010  . Rheumatoid arthritis (HCC) 06/04/2010  .  PARESTHESIA 06/04/2010  . ARTHRALGIA 11/27/2009  . MYALGIA 08/21/2009  . ALLERGIC RHINITIS 06/11/2008  . OSTEOARTHRITIS 06/11/2008  . FATIGUE 06/11/2008  . LOW BACK PAIN 07/27/2007  . COLONIC POLYPS, HX OF 07/27/2007    Past Surgical History:  Procedure Laterality Date  . BACK SURGERY    . CARDIOVASCULAR STRESS TEST  11/21/2015   Low risk nuclear study w/ a small diaphragmatic attenuation artifact, no ischemia/  normal LV function and wall motion , ef 63%  . CATARACT EXTRACTION W/ INTRAOCULAR LENS  IMPLANT, BILATERAL  2015  .  COLONOSCOPY  last one 06-09-2011  . CORONARY ANGIOPLASTY    . CYSTOSCOPY W/ RETROGRADES Left 07/12/2016   Procedure: CYSTOSCOPY WITH RETROGRADE PYELOGRAM LEFT URETERAL STENT;  Surgeon: Bjorn Pippin, MD;  Location: [facility];  Service: Urology;  Laterality: Left;  . CYSTOSCOPY WITH RETROGRADE PYELOGRAM, URETEROSCOPY AND STENT PLACEMENT Left 08/11/2016   Procedure: CYSTOSCOPY WITH RETROGRADE PYELOGRAM,  DIAGNOSTIC URETEROSCOPY , STENT EXCHANGE;  Surgeon: Sebastian Ache, MD;  Location[facility];  Service: Urology;  Laterality: Left;  . 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   severe fibromatosis palm and fingers  . LEFT HEART CATHETERIZATION WITH CORONARY ANGIOGRAM N/A 01/08/2014   Procedure: LEFT HEART CATHETERIZATION WITH CORONARY ANGIOGRAM;  Surgeon: Lennette Bihari, MD;  Location: [facility] CATH LAB;  Service: Cardiovascular;  Laterality:N/A;  total/ subtotal RCA/  mLAD 60-70% w/ mid systolic bridging/  preserved global LVF, ef 55%  . MOHS SURGERY  x2  feb and may 2017   left ear /  left leg  (SCC)  . PERCUTANEOUS CORONARY STENT INTERVENTION (PCI-S)  01/08/2014   Procedure: PERCUTANEOUS CORONARY STENT INTERVENTION (PCI-S);  Surgeon: Lennette Bihari, MD;  Location[facility] CATH LAB;  Service: Cardiovascular;;  DES to mid and distal RCA  . POSTERIOR LUMBAR FUSION  10/01/2003   and Laminectomy/ diskectomy  L4 -- S1  . ROBOT ASSISTED PYELOPLASTY Left 09/15/2016   Procedure: XI ROBOTIC ASSISTED PYELOPLASTY;  Surgeon: Sebastian Ache, MD;  Location: [facility];  Service: Urology;  Laterality: Left;  . TONSILLECTOMY    . TRANSTHORACIC ECHOCARDIOGRAM  02/23/2016   mild LVH,  ef 50-55%,  grade 1 diastolic dysfunction/  mild to moderate AV calcification w/ no stenosis or regurg./  trivial MR and TR/  mild PR        Home Medications    Prior to Admission medications   Medication Sig Start Date End Date Taking? Authorizing Provider  acetaminophen (TYLENOL) 500 MG tablet Take 1,000 mg by mouth every 6  (six) hours as needed.    [provider]  aspirin EC 81 MG tablet Take 81 mg by mouth daily.    [provider]  azithromycin (ZITHROMAX Z-PAK) 250 MG tablet As directed 04/05/19   Plotnikov, Georgina Quint, MD  donepezil (ARICEPT) 10 MG tablet Take 1 tablet (10 mg total) by mouth at bedtime. 04/11/19   Van Clines, MD  ELIQUIS 5 MG TABS tablet TAKE ONE TABLET TWICE DAILY 03/05/19   Allred, Fayrene Fearing, MD  escitalopram (LEXAPRO) 10 MG tablet Take 1 tablet (10 mg total) by mouth daily. 02/05/19   Plotnikov, Georgina Quint, MD  Evolocumab (REPATHA SURECLICK) 140 MG/ML SOAJ Inject 1 pen into the skin every 14 (fourteen) days. 11/16/18   Quintella Reichert, MD  ezetimibe (ZETIA) 10 MG tablet TAKE ONE-HALF TABLET DAILY 04/03/19   Plotnikov, Georgina Quint, MD  fexofenadine (ALLEGRA) 180 MG tablet Take 180 mg by mouth daily as needed for allergies or  rhinitis.     [provider]  lisinopril (PRINIVIL,ZESTRIL) 5 MG tablet TAKE ONE TABLET DAILY 12/18/18   Allred, Fayrene Fearing, MD  LORazepam (ATIVAN) 0.5 MG tablet TAKE ONE OR TWO TABLETS TWICE DAILY AS NEEDED FOR ANXIETY 03/29/17   Plotnikov, Georgina Quint, MD  metoprolol tartrate (LOPRESSOR) 25 MG tablet Take 1 tablet (25 mg total) by mouth 2 (two) times daily. Patient taking differently: Take 25 mg by mouth daily.  01/08/19   Allred, Fayrene Fearing, MD  nitroGLYCERIN (NITROSTAT) 0.4 MG SL tablet ONE TABLET UNDER TONGUE AS NEEDED FOR CHEST PAIN. MAY REPEAT IN 5 MINUTES AND AGAIN IN 5 MINUTES (TOTAL OF 3 TABLETS) Patient not taking: Reported on 04/10/2019 03/10/18   Hillis Range, MD  omeprazole (PRILOSEC) 40 MG capsule TAKE ONE CAPSULE EACH DAY 10/03/18   Plotnikov, Georgina Quint, MD  ondansetron (ZOFRAN) 4 MG tablet Take 1 tablet (4 mg total) by mouth every 8 (eight) hours as needed for nausea or vomiting. 02/07/18   Plotnikov, Georgina Quint, MD  oxyCODONE-acetaminophen (PERCOCET/ROXICET) 5-325 MG tablet Take 1 tablet by mouth every 8 (eight) hours as needed for severe pain. 02/05/19    Plotnikov, Georgina Quint, MD  predniSONE (DELTASONE) 5 MG tablet Take 10 mg by mouth every morning.  06/10/16   [provider]  Propylene Glycol (SYSTANE BALANCE OP) Place 2 drops into both eyes daily as needed (dry eyes).     [provider]  zolpidem (AMBIEN) 10 MG tablet TAKE 1/2 TO 1 TABLET AT BEDTIME AS NEEDED FOR SLEEP 04/12/19   Plotnikov, Georgina Quint, MD    Family History Family History  Problem Relation Age of Onset  . Hypertension Mother   . Heart disease Father   . Colon cancer Neg Hx     Social History Social History   Tobacco Use  . Smoking status: Former Smoker    Years: 10.00    Types: Cigarettes    Last attempt to quit: 08/09/1968    Years since quitting: 50.7  . Smokeless tobacco: Never Used  Substance Use Topics  . Alcohol use: Yes    Alcohol/week: 2.0 standard drinks    Types: 2 Shots of liquor per week    Comment: daily  . Drug use: No     Allergies   Methotrexate; Crestor [rosuvastatin calcium]; Lisinopril; Atorvastatin; and Pravastatin sodium   Review of Systems Review of Systems  Unable to perform ROS: Dementia  Constitutional: Positive for chills.  Respiratory: Negative for cough.   Gastrointestinal: Positive for diarrhea and vomiting.  Neurological: Positive for headaches.  All other systems reviewed and are negative.    Physical Exam Updated Vital Signs Pulse (!) 56   Temp (!) 95.3 F (35.2 C) (Rectal)   Resp 18   Ht 1.803 m (5\' 11" )   Wt 79.4 kg   SpO2 99%   BMI 24.41 kg/m   Physical Exam  CONSTITUTIONAL: Elderly, no acute distress HEAD: Small contusion to right eyebrow, no other signs of trauma EYES: EOMI/PERRL ENMT: Mucous membranes dry NECK: supple no meningeal signs SPINE/BACK:entire spine nontender CV: S1/S2 noted, no murmurs/rubs/gallops noted LUNGS: Lungs are clear to auscultation bilaterally, no apparent distress ABDOMEN: soft, nontender, no rebound or guarding, bowel sounds noted throughout  abdomen GU:no cva tenderness NEURO: Pt is awake/alert moves all extremitiesx4.  No facial droop.  Appears confused but is following commands EXTREMITIES: pulses normal/equal, full ROM SKIN: warm, color normal PSYCH: no abnormalities of mood noted, alert and oriented to situation  ED Treatments /  Results  Labs (all labs ordered are listed, but only abnormal results are displayed) Labs Reviewed  CBC WITH DIFFERENTIAL/PLATELET - Abnormal; Notable for the following components:      Result Value   RBC 4.16 (*)    All other components within normal limits  COMPREHENSIVE METABOLIC PANEL  LIPASE, BLOOD    EKG EKG Interpretation  Date/Time:  Friday Apr 13 2019 04:36:58 EDT Ventricular Rate:  58 PR Interval:    QRS Duration: 111 QT Interval:  490 QTC Calculation: 482 R Axis:   31 Text Interpretation:  Sinus rhythm Abnormal R-wave progression, early transition Borderline prolonged QT interval No significant change since last tracing Confirmed by Zadie Rhine ([id number]) on 04/13/2019 4:40:56 AM   Radiology Ct Head Wo Contrast  Result Date: 04/13/2019 CLINICAL DATA:  Minor head trauma EXAM: CT HEAD WITHOUT CONTRAST TECHNIQUE: Contiguous axial images were obtained from the base of the skull through the vertex without intravenous contrast. COMPARISON:  Three days ago FINDINGS: Brain: No evidence of acute infarction, hemorrhage, hydrocephalus, extra-axial collection. There is atrophy that is moderate. Chronic small vessel ischemia with confluent ischemic gliosis in the cerebral white matter. There is a sellar lesion with sellar expansion. On reformats there is a predominantly low-density/cystic mass contacting the chiasm. Soft tissue density posteriorly could be a fluid level or normal pituitary. No remote imaging for comparison. Vascular: Atherosclerotic calcification. Skull: Negative for fracture. Sinuses/Orbits: No evidence of injury. Bilateral cataract resection. IMPRESSION: 1. No evidence of  intracranial injury. 2. Atrophy and extensive chronic small vessel ischemia. 3. 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 Elective pituitary MRI could be obtained if appropriate for comorbidities. Electronically Signed   By: Marnee Spring M.D.   On: 04/13/2019 05:22    Procedures Procedures  Medications Ordered in ED Medications  fentaNYL (SUBLIMAZE) injection 25 mcg (25 mcg Intravenous Given 04/13/19 0454)  fentaNYL (SUBLIMAZE) injection 100 mcg (100 mcg Intravenous Given 04/13/19 0556)  sodium chloride 0.9 % bolus 1,000 mL (1,000 mLs Intravenous New Bag/Given 04/13/19 0610)  metoCLOPramide (REGLAN) injection 10 mg (10 mg Intravenous Given 04/13/19 0659)  diphenhydrAMINE (BENADRYL) injection 12.5 mg (12.5 mg Intravenous Given 04/13/19 0659)     Initial Impression / Assessment and Plan / ED Course  I have reviewed the triage vital signs and the nursing notes.  Pertinent labs  results that were available during my care of the patient were reviewed by me and considered in my medical decision making (see chart for details).        5:38 AM Presents with nausea vomiting diarrhea.  Apparently it  was abrupt in onset tonight.  Wife also reported he appeared confused.  Patient does have history of dementia.  He reported headache with some bruising to forehead, CT head performed as he is on eliquis which was negative. 7:03 AM Wife Is now at bedside and patient appears improved. She reports that patient woke up and had significant amount of vomiting and diarrhea.  He also.  Cool and clammy and increased confusion.  He had otherwise been well recently. He still reports headache, but CT head is negative. Pt denies CP/ABD pain  Plan at signout to dr Hyacinth Meeker F/u on labs and may need admission  Final Clinical Impressions(s) / ED Diagnoses   Final diagnoses:  None    ED Discharge Orders    None       Zadie Rhine, MD 04/13/19 [phone]

## 2019-04-13 NOTE — ED Provider Notes (Signed)
The patient is well-appearing on my exam, he is back to his normal self with minimal headache.  Has tolerated multiple glasses of fluids, wife is at the bedside, they are stable for discharge.  The patient states his headache is almost completely gone and he feels well.  They understand the indications for return.   Noemi Chapel, MD 04/13/19 1229

## 2019-04-13 NOTE — ED Notes (Signed)
Patient transported to CT 

## 2019-04-17 ENCOUNTER — Encounter: Payer: Self-pay | Admitting: Internal Medicine

## 2019-04-17 ENCOUNTER — Other Ambulatory Visit: Payer: Self-pay

## 2019-04-17 ENCOUNTER — Ambulatory Visit (INDEPENDENT_AMBULATORY_CARE_PROVIDER_SITE_OTHER): Payer: Medicare Other | Admitting: Internal Medicine

## 2019-04-17 DIAGNOSIS — R042 Hemoptysis: Secondary | ICD-10-CM | POA: Diagnosis not present

## 2019-04-17 DIAGNOSIS — I10 Essential (primary) hypertension: Secondary | ICD-10-CM

## 2019-04-17 DIAGNOSIS — S060X9A Concussion with loss of consciousness of unspecified duration, initial encounter: Secondary | ICD-10-CM | POA: Insufficient documentation

## 2019-04-17 DIAGNOSIS — I480 Paroxysmal atrial fibrillation: Secondary | ICD-10-CM | POA: Diagnosis not present

## 2019-04-17 DIAGNOSIS — S060XAA Concussion with loss of consciousness status unknown, initial encounter: Secondary | ICD-10-CM | POA: Insufficient documentation

## 2019-04-17 DIAGNOSIS — S060X0A Concussion without loss of consciousness, initial encounter: Secondary | ICD-10-CM | POA: Diagnosis not present

## 2019-04-17 DIAGNOSIS — I2510 Atherosclerotic heart disease of native coronary artery without angina pectoris: Secondary | ICD-10-CM

## 2019-04-17 NOTE — Assessment & Plan Note (Signed)
On Eliquis

## 2019-04-17 NOTE — Assessment & Plan Note (Signed)
Lisinopril, Toprol 

## 2019-04-17 NOTE — Assessment & Plan Note (Signed)
HA resolved No n/v CT was OK

## 2019-04-17 NOTE — Assessment & Plan Note (Signed)
No relapse 

## 2019-04-17 NOTE — Progress Notes (Signed)
Subjective:  Patient ID: [name], male    DOB: 1933-10-30  Age: 85 y.o. MRN: [id number]  CC: No chief complaint on file.   HPI [name] presents for a post-ER visit on 5/29 after he walked into a door frame - hit his head. Then he had a HA, he had N/V. ?LOC. No fall. He went to ER. Tests reviewed.   Outpatient Medications Prior to Visit  Medication Sig Dispense Refill  . acetaminophen (TYLENOL) 500 MG tablet Take 1,000 mg by mouth every 6 (six) hours as needed.    [name] aspirin EC 81 MG tablet Take 81 mg by mouth daily.    [name] azithromycin (ZITHROMAX Z-PAK) 250 MG tablet As directed 6 tablet 0  . donepezil (ARICEPT) 10 MG tablet Take 1 tablet (10 mg total) by mouth at bedtime. 90 tablet 3  . ELIQUIS 5 MG TABS tablet TAKE ONE TABLET TWICE DAILY 180 tablet 1  . escitalopram (LEXAPRO) 10 MG tablet Take 1 tablet (10 mg total) by mouth daily. 90 tablet 3  . Evolocumab (REPATHA SURECLICK) 425 MG/ML SOAJ Inject 1 pen into the skin every 14 (fourteen) days. 2 pen 11  . ezetimibe (ZETIA) 10 MG tablet TAKE ONE-HALF TABLET DAILY 45 tablet 3  . fexofenadine (ALLEGRA) 180 MG tablet Take 180 mg by mouth daily as needed for allergies or rhinitis.     [name] lisinopril (PRINIVIL,ZESTRIL) 5 MG tablet TAKE ONE TABLET DAILY 90 tablet 3  . LORazepam (ATIVAN) 0.5 MG tablet TAKE ONE OR TWO TABLETS TWICE DAILY AS NEEDED FOR ANXIETY 60 tablet 2  . metoprolol tartrate (LOPRESSOR) 25 MG tablet Take 1 tablet (25 mg total) by mouth 2 (two) times daily. (Patient taking differently: Take 25 mg by mouth daily. ) 180 tablet 2  . nitroGLYCERIN (NITROSTAT) 0.4 MG SL tablet ONE TABLET UNDER TONGUE AS NEEDED FOR CHEST PAIN. MAY REPEAT IN 5 MINUTES AND AGAIN IN 5 MINUTES (TOTAL OF 3 TABLETS) 25 tablet 6  . omeprazole (PRILOSEC) 40 MG capsule TAKE ONE CAPSULE EACH DAY 90 capsule 3  . ondansetron (ZOFRAN ODT) 4 MG disintegrating tablet Take 1 tablet (4 mg total) by mouth every 8 (eight) hours as needed for nausea. 10 tablet 0   . ondansetron (ZOFRAN) 4 MG tablet Take 1 tablet (4 mg total) by mouth every 8 (eight) hours as needed for nausea or vomiting. 20 tablet 0  . oxyCODONE-acetaminophen (PERCOCET/ROXICET) 5-325 MG tablet Take 1 tablet by mouth every 8 (eight) hours as needed for severe pain. 90 tablet 0  . predniSONE (DELTASONE) 5 MG tablet Take 10 mg by mouth every morning.   0  . Propylene Glycol (SYSTANE BALANCE OP) Place 2 drops into both eyes daily as needed (dry eyes).     [name] zolpidem (AMBIEN) 10 MG tablet TAKE 1/2 TO 1 TABLET AT BEDTIME AS NEEDED FOR SLEEP 30 tablet 3   No facility-administered medications prior to visit.     ROS: Review of Systems  Constitutional: Positive for fatigue. Negative for appetite change and unexpected weight change.  HENT: Negative for congestion, nosebleeds, sneezing, sore throat and trouble swallowing.   Eyes: Negative for itching and visual disturbance.  Respiratory: Negative for cough.   Cardiovascular: Negative for chest pain, palpitations and leg swelling.  Gastrointestinal: Negative for abdominal distention, blood in stool, diarrhea and nausea.  Genitourinary: Negative for frequency and hematuria.  Musculoskeletal: Positive for arthralgias. Negative for back pain, gait problem, joint swelling and neck pain.  Skin: Negative  for rash.  Neurological: Negative for dizziness, tremors, speech difficulty and weakness.  Psychiatric/Behavioral: Positive for confusion and decreased concentration. Negative for agitation, dysphoric mood, hallucinations, sleep disturbance and suicidal ideas. The patient is not nervous/anxious.     Objective:  BP 126/78 (BP Location: Left Arm, Patient Position: Sitting, Cuff Size: Normal)   Pulse (!) 59   Temp 98.3 F (36.8 C) (Oral)   Ht 5\' 11"  (1.803 m)   Wt 177 lb (80.3 kg)   SpO2 97%   BMI 24.69 kg/m   BP Readings from Last 3 Encounters:  04/17/19 126/78  04/13/19 (!) 146/86  04/11/19 (!) 145/80    Wt Readings from Last 3  Encounters:  04/17/19 177 lb (80.3 kg)  04/13/19 175 lb (79.4 kg)  04/10/19 175 lb (79.4 kg)    Physical Exam Constitutional:      General: He is not in acute distress.    Appearance: He is well-developed.     Comments: NAD  Eyes:     Conjunctiva/sclera: Conjunctivae normal.     Pupils: Pupils are equal, round, and reactive to light.  Neck:     Musculoskeletal: Normal range of motion.     Thyroid: No thyromegaly.     Vascular: No JVD.  Cardiovascular:     Rate and Rhythm: Normal rate and regular rhythm.     Heart sounds: Normal heart sounds. No murmur. No friction rub. No gallop.   Pulmonary:     Effort: Pulmonary effort is normal. No respiratory distress.     Breath sounds: Normal breath sounds. No wheezing or rales.  Chest:     Chest wall: No tenderness.  Abdominal:     General: Bowel sounds are normal. There is no distension.     Palpations: Abdomen is soft. There is no mass.     Tenderness: There is no abdominal tenderness. There is no guarding or rebound.  Musculoskeletal: Normal range of motion.        General: No tenderness.  Lymphadenopathy:     Cervical: No cervical adenopathy.  Skin:    General: Skin is warm and dry.     Findings: No rash.  Neurological:     Mental Status: He is alert and oriented to person, place, and time.     Cranial Nerves: No cranial nerve deficit.     Motor: No abnormal muscle tone.     Coordination: Coordination normal.     Gait: Gait normal.     Deep Tendon Reflexes: Reflexes are normal and symmetric.  Psychiatric:        Behavior: Behavior normal.        Thought Content: Thought content normal.        Judgment: Judgment normal.   alert, cooperative Bruised eyebrow R, L lower lip  Lab Results  Component Value Date   WBC 7.2 04/13/2019   HGB 13.4 04/13/2019   HCT 40.1 04/13/2019   PLT 270 04/13/2019   GLUCOSE 131 (H) 04/13/2019   CHOL 190 12/29/2018   TRIG 239 (H) 12/29/2018   HDL 44 12/29/2018   LDLDIRECT 168.9  05/24/2013   LDLCALC 98 12/29/2018   ALT 14 04/13/2019   AST 18 04/13/2019   NA 141 04/13/2019   K 3.1 (L) 04/13/2019   CL 106 04/13/2019   CREATININE 0.85 04/13/2019   BUN 26 (H) 04/13/2019   CO2 22 04/13/2019   TSH 1.18 12/13/2017   PSA 2.46 02/21/2017   INR 0.92 09/13/2016   HGBA1C 6.4 08/30/2017  Ct Head Wo Contrast  Result Date: 04/13/2019 CLINICAL DATA:  Minor head trauma EXAM: CT HEAD WITHOUT CONTRAST TECHNIQUE: Contiguous axial images were obtained from the base of the skull through the vertex without intravenous contrast. COMPARISON:  Three days ago FINDINGS: Brain: No evidence of acute infarction, hemorrhage, hydrocephalus, extra-axial collection. There is atrophy that is moderate. Chronic small vessel ischemia with confluent ischemic gliosis in the cerebral white matter. There is a sellar lesion with sellar expansion. On reformats there is a predominantly low-density/cystic mass contacting the chiasm. Soft tissue density posteriorly could be a fluid level or normal pituitary. No remote imaging for comparison. Vascular: Atherosclerotic calcification. Skull: Negative for fracture. Sinuses/Orbits: No evidence of injury. Bilateral cataract resection. IMPRESSION: 1. No evidence of intracranial injury. 2. Atrophy and extensive chronic small vessel ischemia. 3. 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 Elective pituitary MRI could be obtained if appropriate for comorbidities. Electronically Signed   By: Monte Fantasia M.D.   On: 04/13/2019 05:22    Assessment & Plan:   There are no diagnoses linked to this encounter.   No orders of the defined types were placed in this encounter.    Follow-up: No follow-ups on file.  [name], MD

## 2019-04-17 NOTE — Patient Instructions (Signed)
Concussion, Adult  A concussion is a brain injury from a direct hit (blow) to the head or body. This injury causes the brain to shake quickly back and forth inside the skull. It is caused by:  · A hit to the head.  · A quick and sudden movement (jolt) of the head or neck.  How fast you will get better from a concussion depends on many things. Recovery can take time. It is important to wait to return to activity until a doctor says it is safe and your symptoms are all gone.  Follow these instructions at home:  Activity  · Limit activities that need a lot of thought or concentration. You may need to talk with your work manager or teachers about this. Limit activities such as:  ? Homework or work for your job.  ? Watching TV.  ? Computer work.  ? Playing memory games and puzzles.  · Rest. Rest helps the brain to heal. Make sure you:  ? Get plenty of sleep at night. Do not stay up late.  ? Rest during the day. Take naps or rest breaks when you feel tired.  · Do not do activities that could cause a second concussion, such as riding a bike or playing sports. It can be dangerous if you get another concussion before the first one has healed.  · Ask your doctor when you can return to your normal activities, like driving, riding a bike, or using machinery. Your ability to react may be slower. Do not do these activities if you are dizzy. Your doctor will likely give you a plan for slowly going back to activities.  General instructions  · Take over-the-counter and prescription medicines only as told by your doctor.  · Do not drink alcohol until your doctor says you can.  · Watch your symptoms and tell other people to do the same. Other problems (complications) can happen after a concussion. Older adults with a brain injury may have a higher risk of serious problems, such as a blood clot in the brain.  · Tell your work manager, teachers, school nurse, school counselor, coach, or athletic trainer about your injury and symptoms.  Tell them about what you can or cannot do. They should watch you for:  ? More problems with attention or concentration.  ? More trouble remembering or learning new information.  ? More time needed to do tasks or assignments.  ? Being more annoyed (irritable) or having a harder time dealing with stress.  ? Any other symptoms that get worse.  · Keep all follow-up visits as told by your doctor. This is important.  Prevention  · It is very important that you donot get another brain injury, especially before you have healed. In rare cases, another injury can cause permanent brain damage, brain swelling, or death. You have the most risk if you get another head injury in the first 7-10 days after you were hurt before. To avoid injuries:  ? Avoid activities that could make you get a second concussion, like contact sports.  ? When you have returned to sports or activities:  § Avoid plays or moves that can cause you to crash into another person. This is how most concussions happen.  § Follow the rules and be respectful of other players.  ? Get regular exercise that includes strength and balance training.  ? Wear a helmet when you do activities like:  § Biking.  § Skiing.  § Skateboarding.  § Skating.  ?   Helmets can help protect you from serious skull and brain injuries, but they do not protect your from a concussion. Even when wearing a helmet, you should avoid being hit in the head.  Contact a doctor if:  · Your symptoms get worse or they do not get better.  · You have new symptoms.  · You have another injury.  Get help right away if:  · You have bad headaches or your headaches get worse.  · You have weakness in any part of your body.  · You are confused.  · Your coordination gets worse.  · You keep throwing up (vomiting).  · You feel more sleepy than normal.  · You twitch or shake violently (convulse) or have a seizure.  · Your speech is not clear (is slurred).  · You have strange behavior changes.  · You have changes in  how you see (vision).  · You pass out (lose consciousness).  Summary  · A concussion is a brain injury from a direct hit (blow) to the head or body.  · This condition is treated with rest and careful watching of symptoms.  · If you keep having symptoms, call your doctor.  This information is not intended to replace advice given to you by your health care provider. Make sure you discuss any questions you have with your health care provider.  Document Released: 10/20/2009 Document Revised: 12/13/2017 Document Reviewed: 12/13/2017  Elsevier Interactive Patient Education © 2019 [employer]

## 2019-04-23 ENCOUNTER — Other Ambulatory Visit: Payer: Self-pay | Admitting: Internal Medicine

## 2019-04-23 ENCOUNTER — Telehealth: Payer: Self-pay | Admitting: Neurology

## 2019-04-23 DIAGNOSIS — M0609 Rheumatoid arthritis without rheumatoid factor, multiple sites: Secondary | ICD-10-CM | POA: Diagnosis not present

## 2019-04-23 NOTE — Telephone Encounter (Signed)
Pt wife called back address verified pack-it    placed in the mail,

## 2019-04-23 NOTE — Telephone Encounter (Signed)
Patient wife states that we were going to mail her out a packet of information about dementia and they never got anything  Please resend the information

## 2019-05-07 ENCOUNTER — Encounter: Payer: Self-pay | Admitting: Internal Medicine

## 2019-05-07 ENCOUNTER — Other Ambulatory Visit: Payer: Self-pay

## 2019-05-07 ENCOUNTER — Ambulatory Visit (INDEPENDENT_AMBULATORY_CARE_PROVIDER_SITE_OTHER): Payer: Medicare Other | Admitting: Internal Medicine

## 2019-05-07 DIAGNOSIS — R413 Other amnesia: Secondary | ICD-10-CM

## 2019-05-07 DIAGNOSIS — I2510 Atherosclerotic heart disease of native coronary artery without angina pectoris: Secondary | ICD-10-CM | POA: Diagnosis not present

## 2019-05-07 DIAGNOSIS — S060X0A Concussion without loss of consciousness, initial encounter: Secondary | ICD-10-CM | POA: Diagnosis not present

## 2019-05-07 DIAGNOSIS — R410 Disorientation, unspecified: Secondary | ICD-10-CM

## 2019-05-07 DIAGNOSIS — G4700 Insomnia, unspecified: Secondary | ICD-10-CM

## 2019-05-07 DIAGNOSIS — I10 Essential (primary) hypertension: Secondary | ICD-10-CM | POA: Diagnosis not present

## 2019-05-07 MED ORDER — LISINOPRIL 5 MG PO TABS: 2.5000 mg | ORAL_TABLET | Freq: Every day | ORAL | 3 refills | Status: DC

## 2019-05-07 MED ORDER — MEMANTINE HCL 10 MG PO TABS: 10.0000 mg | ORAL_TABLET | Freq: Two times a day (BID) | ORAL | 11 refills | Status: DC

## 2019-05-07 NOTE — Assessment & Plan Note (Signed)
Recovering  

## 2019-05-07 NOTE — Assessment & Plan Note (Addendum)
Aricept - d/c Namenda Dr Delice Lesch. No driving

## 2019-05-07 NOTE — Assessment & Plan Note (Signed)
F/u w/Dr Delice Lesch. No driving

## 2019-05-07 NOTE — Assessment & Plan Note (Signed)
Reduce Lisinopril down to 1/2 a day

## 2019-05-07 NOTE — Progress Notes (Signed)
Subjective:  Patient ID: [name], male    DOB: 1932/11/29  Age: 85 y.o. MRN: [id number]  CC: No chief complaint on file.   HPI [name] presents for memory loss, depression, insomnia f/u. [name] is here. Aricept caused nightmares  C/o dizziness when standing up  Outpatient Medications Prior to Visit  Medication Sig Dispense Refill  . acetaminophen (TYLENOL) 500 MG tablet Take 1,000 mg by mouth every 6 (six) hours as needed.    [name] aspirin EC 81 MG tablet Take 81 mg by mouth daily.    [name] donepezil (ARICEPT) 10 MG tablet Take 1 tablet (10 mg total) by mouth at bedtime. 90 tablet 3  . ELIQUIS 5 MG TABS tablet TAKE ONE TABLET TWICE DAILY 180 tablet 1  . escitalopram (LEXAPRO) 10 MG tablet Take 1 tablet (10 mg total) by mouth daily. 90 tablet 3  . Evolocumab (REPATHA SURECLICK) 409 MG/ML SOAJ Inject 1 pen into the skin every 14 (fourteen) days. 2 pen 11  . ezetimibe (ZETIA) 10 MG tablet TAKE ONE-HALF TABLET DAILY 45 tablet 3  . fexofenadine (ALLEGRA) 180 MG tablet Take 180 mg by mouth daily as needed for allergies or rhinitis.     [name] lisinopril (PRINIVIL,ZESTRIL) 5 MG tablet TAKE ONE TABLET DAILY 90 tablet 3  . LORazepam (ATIVAN) 0.5 MG tablet TAKE ONE OR TWO TABLETS TWICE DAILY AS NEEDED FOR ANXIETY 60 tablet 2  . metoprolol tartrate (LOPRESSOR) 25 MG tablet Take 1 tablet (25 mg total) by mouth 2 (two) times daily. (Patient taking differently: Take 25 mg by mouth daily. ) 180 tablet 2  . nitroGLYCERIN (NITROSTAT) 0.4 MG SL tablet ONE TABLET UNDER TONGUE AS NEEDED FOR CHEST PAIN. MAY REPEAT IN 5 MINUTES AND AGAIN IN 5 MINUTES (TOTAL OF 3 TABLETS) 25 tablet 6  . omeprazole (PRILOSEC) 40 MG capsule TAKE ONE CAPSULE EACH DAY 90 capsule 3  . ondansetron (ZOFRAN ODT) 4 MG disintegrating tablet Take 1 tablet (4 mg total) by mouth every 8 (eight) hours as needed for nausea. 10 tablet 0  . ondansetron (ZOFRAN) 4 MG tablet Take 1 tablet (4 mg total) by mouth every 8 (eight) hours as needed for  nausea or vomiting. 20 tablet 0  . oxyCODONE-acetaminophen (PERCOCET/ROXICET) 5-325 MG tablet Take 1 tablet by mouth every 8 (eight) hours as needed for severe pain. 90 tablet 0  . predniSONE (DELTASONE) 5 MG tablet Take 10 mg by mouth every morning.   0  . Propylene Glycol (SYSTANE BALANCE OP) Place 2 drops into both eyes daily as needed (dry eyes).     [name] zolpidem (AMBIEN) 10 MG tablet TAKE 1/2 TO 1 TABLET AT BEDTIME AS NEEDED FOR SLEEP 30 tablet 3   No facility-administered medications prior to visit.     ROS: Review of Systems  Constitutional: Negative for appetite change, fatigue and unexpected weight change.  HENT: Negative for congestion, nosebleeds, sneezing, sore throat and trouble swallowing.   Eyes: Negative for itching and visual disturbance.  Respiratory: Negative for cough.   Cardiovascular: Negative for chest pain, palpitations and leg swelling.  Gastrointestinal: Negative for abdominal distention, blood in stool, diarrhea and nausea.  Genitourinary: Negative for frequency and hematuria.  Musculoskeletal: Negative for back pain, gait problem, joint swelling and neck pain.  Skin: Negative for rash.  Neurological: Positive for dizziness. Negative for tremors, speech difficulty and weakness.  Psychiatric/Behavioral: Positive for confusion and decreased concentration. Negative for agitation, dysphoric mood and sleep disturbance. The patient is not nervous/anxious.  Objective:  BP 132/78 (BP Location: Left Arm, Patient Position: Sitting, Cuff Size: Normal)   Pulse 68   Temp (!) 97.5 F (36.4 C) (Oral)   Ht 5\' 11"  (1.803 m)   Wt 181 lb (82.1 kg)   SpO2 97%   BMI 25.24 kg/m   BP Readings from Last 3 Encounters:  05/07/19 132/78  04/17/19 126/78  04/13/19 (!) 146/86    Wt Readings from Last 3 Encounters:  05/07/19 181 lb (82.1 kg)  04/17/19 177 lb (80.3 kg)  04/13/19 175 lb (79.4 kg)    Physical Exam Constitutional:      General: He is not in acute distress.     Appearance: He is well-developed.     Comments: NAD  Eyes:     Conjunctiva/sclera: Conjunctivae normal.     Pupils: Pupils are equal, round, and reactive to light.  Neck:     Musculoskeletal: Normal range of motion.     Thyroid: No thyromegaly.     Vascular: No JVD.  Cardiovascular:     Rate and Rhythm: Normal rate and regular rhythm.     Heart sounds: Normal heart sounds. No murmur. No friction rub. No gallop.   Pulmonary:     Effort: Pulmonary effort is normal. No respiratory distress.     Breath sounds: Normal breath sounds. No wheezing or rales.  Chest:     Chest wall: No tenderness.  Abdominal:     General: Bowel sounds are normal. There is no distension.     Palpations: Abdomen is soft. There is no mass.     Tenderness: There is no abdominal tenderness. There is no guarding or rebound.  Musculoskeletal: Normal range of motion.        General: No tenderness.  Lymphadenopathy:     Cervical: No cervical adenopathy.  Skin:    General: Skin is warm and dry.     Findings: No rash.  Neurological:     Mental Status: He is alert and oriented to person, place, and time.     Cranial Nerves: No cranial nerve deficit.     Motor: No abnormal muscle tone.     Coordination: Coordination normal.     Gait: Gait normal.     Deep Tendon Reflexes: Reflexes are normal and symmetric.  Psychiatric:        Behavior: Behavior normal.        Thought Content: Thought content normal.        Judgment: Judgment normal.   alert and cooperative  Lab Results  Component Value Date   WBC 7.2 04/13/2019   HGB 13.4 04/13/2019   HCT 40.1 04/13/2019   PLT 270 04/13/2019   GLUCOSE 131 (H) 04/13/2019   CHOL 190 12/29/2018   TRIG 239 (H) 12/29/2018   HDL 44 12/29/2018   LDLDIRECT 168.9 05/24/2013   LDLCALC 98 12/29/2018   ALT 14 04/13/2019   AST 18 04/13/2019   NA 141 04/13/2019   K 3.1 (L) 04/13/2019   CL 106 04/13/2019   CREATININE 0.85 04/13/2019   BUN 26 (H) 04/13/2019   CO2 22  04/13/2019   TSH 1.18 12/13/2017   PSA 2.46 02/21/2017   INR 0.92 09/13/2016   HGBA1C 6.4 08/30/2017    Ct Head Wo Contrast  Result Date: 04/13/2019 CLINICAL DATA:  Minor head trauma EXAM: CT HEAD WITHOUT CONTRAST TECHNIQUE: Contiguous axial images were obtained from the base of the skull through the vertex without intravenous contrast. COMPARISON:  Three days ago FINDINGS: Brain:  No evidence of acute infarction, hemorrhage, hydrocephalus, extra-axial collection. There is atrophy that is moderate. Chronic small vessel ischemia with confluent ischemic gliosis in the cerebral white matter. There is a sellar lesion with sellar expansion. On reformats there is a predominantly low-density/cystic mass contacting the chiasm. Soft tissue density posteriorly could be a fluid level or normal pituitary. No remote imaging for comparison. Vascular: Atherosclerotic calcification. Skull: Negative for fracture. Sinuses/Orbits: No evidence of injury. Bilateral cataract resection. IMPRESSION: 1. No evidence of intracranial injury. 2. Atrophy and extensive chronic small vessel ischemia. 3. 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 Elective pituitary MRI could be obtained if appropriate for comorbidities. Electronically Signed   By: Monte Fantasia M.D.   On: 04/13/2019 05:22    Assessment & Plan:   There are no diagnoses linked to this encounter.   No orders of the defined types were placed in this encounter.    Follow-up: No follow-ups on file.  [name], MD

## 2019-05-07 NOTE — Assessment & Plan Note (Signed)
Zolpidem prn 

## 2019-05-21 DIAGNOSIS — M0609 Rheumatoid arthritis without rheumatoid factor, multiple sites: Secondary | ICD-10-CM | POA: Diagnosis not present

## 2019-05-21 DIAGNOSIS — Z79899 Other long term (current) drug therapy: Secondary | ICD-10-CM | POA: Diagnosis not present

## 2019-05-23 ENCOUNTER — Other Ambulatory Visit: Payer: Self-pay | Admitting: Internal Medicine

## 2019-05-23 DIAGNOSIS — Z6825 Body mass index (BMI) 25.0-25.9, adult: Secondary | ICD-10-CM | POA: Diagnosis not present

## 2019-05-23 DIAGNOSIS — E663 Overweight: Secondary | ICD-10-CM | POA: Diagnosis not present

## 2019-05-23 DIAGNOSIS — M0609 Rheumatoid arthritis without rheumatoid factor, multiple sites: Secondary | ICD-10-CM | POA: Diagnosis not present

## 2019-05-23 DIAGNOSIS — M5030 Other cervical disc degeneration, unspecified cervical region: Secondary | ICD-10-CM | POA: Diagnosis not present

## 2019-05-23 NOTE — Telephone Encounter (Signed)
 Controlled Database Checked Last filled: 02/05/19 # 90 LOV w/you: 05/07/19 Next appt w/you: 07/09/19

## 2019-05-24 ENCOUNTER — Other Ambulatory Visit: Payer: Self-pay | Admitting: Internal Medicine

## 2019-05-24 MED ORDER — ESCITALOPRAM OXALATE 20 MG PO TABS: 20.0000 mg | ORAL_TABLET | Freq: Every day | ORAL | 3 refills | Status: DC

## 2019-05-24 NOTE — Telephone Encounter (Signed)
Pt wife called to have a new Rx sent in for escitalopram (LEXAPRO) 10 MG tablet  But instead of 10mg  it should be increased to 20mg  / please advise   Send Rx to    [facility], [place] - [address] [phone] (Phone) [phone] (Fax)

## 2019-05-24 NOTE — Telephone Encounter (Signed)
Ok 20 mg Thx

## 2019-05-24 NOTE — Telephone Encounter (Signed)
Please advise. Per last OV note, he was taking Lexapro 10 mg qd.

## 2019-05-25 NOTE — Telephone Encounter (Signed)
Pt spouse informed new rx was sent.

## 2019-05-28 DIAGNOSIS — Z961 Presence of intraocular lens: Secondary | ICD-10-CM | POA: Diagnosis not present

## 2019-05-30 ENCOUNTER — Ambulatory Visit: Payer: Self-pay | Admitting: Internal Medicine

## 2019-05-30 NOTE — Telephone Encounter (Signed)
Pt's wife calling, on [place]. States pt was moving heavy iron furniture, now with complaints of right shoulder pain, onset this am. States does radiate down right arm. Has applied ice, took Oxy. Last this am and tylenol 1 hr ago. Information provided by wife, pt "Napping" reports h/o dementia.  States ROM decreased "Because he's babying it." No swelling or redness. No SOB, no chest pain, sweating or nausea. Right sided only. Care advise given per protocol and advised to go to ED if other symptoms arise, verbalizes understanding. Requesting to speak with Dr. [name] or virtual appt "He's anxious and would just feel better."  TN attempted to reach practice, placed on hold extended period of time. Assured wife TN would route to practice for PCPs review. Pts email and ph# verified.  Please advise: [phone]  Reason for Disposition . [1] Unable to use arm at all AND [2] because of shoulder pain or stiffness  Answer Assessment - Initial Assessment Questions 1. ONSET: "When did the pain start?"     This am 2. LOCATION: "Where is the pain located?"    Right shoulder 3. PAIN: "How bad is the pain?" (Scale 1-10; or mild, moderate, severe)   - MILD (1-3): doesn't interfere with normal activities   - MODERATE (4-7): interferes with normal activities (e.g., work or school) or awakens from sleep   - SEVERE (8-10): excruciating pain, unable to do any normal activities, unao move arm at all due to pain    "Bad" 4. WORK OR EXERCISE: "Has there been any recent work or exercise that involved this part of the body?"    Yes, heavy lifting 5. CAUSE: "What do you think is causing the shoulder pain?"     lifting 6. OTHER SYMPTOMS: "Do you have any other symptoms?" (e.g., neck pain, swelling, rash, fever, numbness, weakness)     no  Protocols used: SHOULDER PAIN-A-AH

## 2019-05-30 NOTE — Telephone Encounter (Signed)
Would you like pt to come into office or is VOV fine?

## 2019-05-31 ENCOUNTER — Telehealth: Payer: Self-pay | Admitting: Pharmacist

## 2019-05-31 DIAGNOSIS — M25512 Pain in left shoulder: Secondary | ICD-10-CM | POA: Insufficient documentation

## 2019-05-31 DIAGNOSIS — M7541 Impingement syndrome of right shoulder: Secondary | ICD-10-CM | POA: Diagnosis not present

## 2019-05-31 DIAGNOSIS — M25511 Pain in right shoulder: Secondary | ICD-10-CM | POA: Diagnosis not present

## 2019-05-31 NOTE — Telephone Encounter (Signed)
Please schedule VOV since he has already been applying ice, thank you!

## 2019-05-31 NOTE — Telephone Encounter (Signed)
Use ice OV if not better Thx

## 2019-05-31 NOTE — Telephone Encounter (Signed)
LVM for patient to call back to make an appt  °

## 2019-05-31 NOTE — Telephone Encounter (Signed)
PA for Praluent extended through 05/30/2020

## 2019-06-02 ENCOUNTER — Encounter (HOSPITAL_COMMUNITY): Payer: Self-pay | Admitting: Emergency Medicine

## 2019-06-02 ENCOUNTER — Other Ambulatory Visit: Payer: Self-pay

## 2019-06-02 ENCOUNTER — Ambulatory Visit (HOSPITAL_COMMUNITY)
Admission: EM | Admit: 2019-06-02 | Discharge: 2019-06-02 | Disposition: A | Discharge status: Home / Self Care | Payer: Medicare Other | Attending: Emergency Medicine | Admitting: Emergency Medicine

## 2019-06-02 DIAGNOSIS — M25511 Pain in right shoulder: Secondary | ICD-10-CM

## 2019-06-02 MED ORDER — METHYLPREDNISOLONE SODIUM SUCC 125 MG IJ SOLR
80.0000 mg | Freq: Once | INTRAMUSCULAR | Status: AC
  Administered 2019-06-02: 80 mg via INTRAMUSCULAR

## 2019-06-02 MED ORDER — METHYLPREDNISOLONE SODIUM SUCC 125 MG IJ SOLR
INTRAMUSCULAR | Status: AC
  Filled 2019-06-02: qty 2

## 2019-06-02 NOTE — ED Provider Notes (Signed)
[place]    CSN: [id number] Arrival date & time: 06/02/19  1043     History   Chief Complaint Chief Complaint  Patient presents with  . Shoulder Pain  . Numbness    [name] is a 85 y.o. male.   Patient presents with right shoulder pain radiating down his right arm x1 week.  He states he was seen by an orthopedic on 05/31/2019 and diagnosed with a pinched nerve; he was prescribed a muscle relaxer which is not helping; x-ray done at that visit was negative.  He also has a prescription for Percocet which he says is not helping.  He denies injury or falls.  He denies chest pain, shortness of breath, dizziness, palpitations, weakness.    The history is provided by the patient and the spouse.    Past Medical History:  Diagnosis Date  . CAD (coronary artery disease)    a. s/p inferior STEMI 01/08/2014; LHC 01/08/14: total RCA occlusion s/p 3.5x60mm Xience DES distal RCA and 3.5x28 mm DES mid RCA, 60-70% mid LAD stenosis, EF 55%.  . Complication of anesthesia    'long to wake up after back surgery " 09/2003  . Diverticulosis of colon   . GERD (gastroesophageal reflux disease)   . History of cellulitis    05-26-2015  LLE  . History of colon polyps    1998- benign/  2008 adenomatous   . History of kidney stones    2013  . History of squamous cell carcinoma excision    2013;  2015;  06-12-2015 right leg/  02/ and 05/ 2017  left ear and left leg  . Hydronephrosis, left   . Hyperlipidemia   . Hypertension   . Migraine with aura   . OSA on CPAP 06/19/2015   Moderate OSA with AHI 18/hr  per study 05-20-2015  . Osteoarthritis   . Paroxysmal atrial fibrillation ([place]) 4/16   chads2vasc score is at least 4  . Premature atrial contractions   . RA (rheumatoid arthritis) [facility])    rheumatologist-  dr Leigh Aurora  . Sinusitis, chronic 10/17/2015  . Wears glasses   . Wears hearing aid    bilateral    Patient Active Problem List   Diagnosis Date Noted  .  Concussion 04/17/2019  . Hemoptysis 04/05/2019  . Confusion 04/05/2019  . Memory loss 12/21/2018  . Nausea & vomiting 02/07/2018  . Chills 02/07/2018  . Rash 02/07/2018  . Insomnia 09/22/2017  . Cervical spondylolysis 06/28/2017  . Hyperglycemia 06/22/2017  . Well adult exam 11/18/2016  . Hydronephrosis 11/18/2016  . UPJ obstruction, 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  . Allergic rhinitis 06/02/2016  . Asthma with exacerbation 06/02/2016  . Sinusitis, chronic 10/17/2015  . OSA (obstructive sleep apnea) 06/19/2015  . Cellulitis and abscess of leg, except foot 05/26/2015  . Paroxysmal atrial fibrillation ([place]) 02/21/2015  . CAP (community acquired pneumonia) 01/02/2015  . Upper respiratory infection 12/02/2014  . Vivid dream 09/25/2014  . Actinic keratoses 04/26/2014  . CAD (coronary artery disease) 02/25/2014  . STEMI (ST elevation myocardial infarction) ([place]) 01/08/2014  . Rheumatoid bursitis of left elbow ([place]) 08/08/2013  . Situational depression 06/18/2013  . Neck pain 04/23/2012  . Acute abdominal pain in left flank 02/14/2012  . Hypertension 09/23/2011  . Chest pain, midsternal 09/13/2011  . Mixed hyperlipidemia 07/23/2010  . Palpitations 07/07/2010  . Rheumatoid arthritis ([place]) 06/04/2010  . PARESTHESIA 06/04/2010  . ARTHRALGIA 11/27/2009  . MYALGIA 08/21/2009  . ALLERGIC RHINITIS 06/11/2008  .  OSTEOARTHRITIS 06/11/2008  . FATIGUE 06/11/2008  . LOW BACK PAIN 07/27/2007  . COLONIC POLYPS, HX OF 07/27/2007    Past Surgical History:  Procedure Laterality Date  . BACK SURGERY    . CARDIOVASCULAR STRESS TEST  11/21/2015   Low risk nuclear study w/ a small diaphragmatic attenuation artifact, no ischemia/  normal LV function and wall motion , ef 63%  . CATARACT EXTRACTION W/ INTRAOCULAR LENS  IMPLANT, BILATERAL  2015  . COLONOSCOPY  last one 06-09-2011  . CORONARY ANGIOPLASTY    . CYSTOSCOPY W/ RETROGRADES Left 07/12/2016   Procedure: CYSTOSCOPY WITH RETROGRADE PYELOGRAM LEFT  URETERAL STENT;  Surgeon: Irine Seal, MD;  Location: [facility];  Service: Urology;  Laterality: Left;  . CYSTOSCOPY WITH RETROGRADE PYELOGRAM, URETEROSCOPY AND STENT PLACEMENT Left 08/11/2016   Procedure: CYSTOSCOPY WITH RETROGRADE PYELOGRAM,  DIAGNOSTIC URETEROSCOPY , STENT EXCHANGE;  Surgeon: Alexis Frock, MD;  Location: [facility];  Service: Urology;  Laterality: Left;  . 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   severe fibromatosis palm and fingers  . LEFT HEART CATHETERIZATION WITH CORONARY ANGIOGRAM N/A 01/08/2014   Procedure: LEFT HEART CATHETERIZATION WITH CORONARY ANGIOGRAM;  Surgeon: Troy Sine, MD;  Location: [facility] C[facility];  Service: Cardiovascular;  Laterality:N/A;  total/ subtotal RCA/  mLAD 09-32% w/ mid systolic bridging/  preserved global LVF, ef 55%  . MOHS SURGERY  x2  feb and may 2017   left ear /  left leg  ([facility])  . PERCUTANEOUS CORONARY STENT INTERVENTION (PCI-S)  01/08/2014   Procedure: PERCUTANEOUS CORONARY STENT INTERVENTION (PCI-S);  Surgeon: Troy Sine, MD;  Location: [facility] CATH LAB;  Service: Cardiovascular;;  DES to mid and distal RCA  . POSTERIOR LUMBAR FUSION  10/01/2003   and Laminectomy/ diskectomy  L4 -- S1  . ROBOT ASSISTED PYELOPLASTY Left 09/15/2016   Procedure: XI ROBOTIC ASSISTED PYELOPLASTY;  Surgeon: Alexis Frock, MD;  Location: [facility];  Service: Urology;  Laterality: Left;  . TONSILLECTOMY    . TRANSTHORACIC ECHOCARDIOGRAM  02/23/2016   mild LVH,  ef 50-55%,  grade 1 diastolic dysfunction/  mild to moderate AV calcification w/ no stenosis or regurg./  trivial MR and TR/  mild PR       Home Medications    Prior to Admission medications   Medication Sig Start Date End Date Taking? Authorizing Provider  acetaminophen (TYLENOL) 500 MG tablet Take 1,000 mg by mouth every 6 (six) hours as needed.    [provider]  aspirin EC 81 MG tablet Take 81 mg by mouth daily.    [provider]  ELIQUIS 5 MG TABS tablet  TAKE ONE TABLET TWICE DAILY 03/05/19   Allred, Jeneen Rinks, MD  escitalopram (LEXAPRO) 20 MG tablet Take 1 tablet (20 mg total) by mouth daily. 05/24/19   Plotnikov, Evie Lacks, MD  Evolocumab (REPATHA SURECLICK) 355 MG/ML SOAJ Inject 1 pen into the skin every 14 (fourteen) days. 11/16/18   Sueanne Margarita, MD  ezetimibe (ZETIA) 10 MG tablet TAKE ONE-HALF TABLET DAILY 04/03/19   Plotnikov, Evie Lacks, MD  fexofenadine (ALLEGRA) 180 MG tablet Take 180 mg by mouth daily as needed for allergies or rhinitis.     [provider]  lisinopril (ZESTRIL) 5 MG tablet Take 0.5 tablets (2.5 mg total) by mouth daily. 05/07/19   Plotnikov, Evie Lacks, MD  LORazepam (ATIVAN) 0.5 MG tablet TAKE ONE OR TWO TABLETS TWICE DAILY AS NEEDED FOR ANXIETY 03/29/17   Plotnikov, Evie Lacks, MD  memantine [facility])  10 MG tablet Take 1 tablet (10 mg total) by mouth 2 (two) times daily. 05/07/19   Plotnikov, Evie Lacks, MD  metoprolol tartrate (LOPRESSOR) 25 MG tablet Take 1 tablet (25 mg total) by mouth 2 (two) times daily. Patient taking differently: Take 25 mg by mouth daily.  01/08/19   Allred, Jeneen Rinks, MD  nitroGLYCERIN (NITROSTAT) 0.4 MG SL tablet ONE TABLET UNDER TONGUE AS NEEDED FOR CHEST PAIN. MAY REPEAT IN 5 MINUTES AND AGAIN IN 5 MINUTES (TOTAL OF 3 TABLETS) 03/10/18   Allred, Jeneen Rinks, MD  omeprazole (PRILOSEC) 40 MG capsule TAKE ONE CAPSULE EACH DAY 10/03/18   Plotnikov, Evie Lacks, MD  ondansetron (ZOFRAN ODT) 4 MG disintegrating tablet Take 1 tablet (4 mg total) by mouth every 8 (eight) hours as needed for nausea. 04/13/19   Noemi Chapel, MD  ondansetron (ZOFRAN) 4 MG tablet Take 1 tablet (4 mg total) by mouth every 8 (eight) hours as needed for nausea or vomiting. 02/07/18   Plotnikov, Evie Lacks, MD  oxyCODONE-acetaminophen (PERCOCET/ROXICET) 5-325 MG tablet TAKE ONE TABLET EVERY EIGHT HOURS AS NEEDED FOR SEVERE PAIN 05/23/19   Plotnikov, Evie Lacks, MD  predniSONE (DELTASONE) 5 MG tablet Take 10 mg by mouth every morning.  06/10/16    [provider]  Propylene Glycol (SYSTANE BALANCE OP) Place 2 drops into both eyes daily as needed (dry eyes).     [provider]  zolpidem (AMBIEN) 10 MG tablet TAKE 1/2 TO 1 TABLET AT BEDTIME AS NEEDED FOR SLEEP 04/12/19   Plotnikov, Evie Lacks, MD    Family History Family History  Problem Relation Age of Onset  . Hypertension Mother   . Heart disease Father   . Colon cancer Neg Hx     Social History Social History   Tobacco Use  . Smoking status: Former Smoker    Years: 10.00    Types: Cigarettes    Quit date: 08/09/1968    Years since quitting: 50.8  . Smokeless tobacco: Never Used  Substance Use Topics  . Alcohol use: Yes    Alcohol/week: 2.0 standard drinks    Types: 2 Shots of liquor per week    Comment: daily  . Drug use: No     Allergies   Methotrexate, Aricept [donepezil hcl], Crestor [rosuvastatin calcium], Lisinopril, Atorvastatin, and Pravastatin sodium   Review of Systems Review of Systems  Constitutional: Negative for chills and fever.  HENT: Negative for ear pain and sore throat.   Eyes: Negative for pain and visual disturbance.  Respiratory: Negative for cough and shortness of breath.   Cardiovascular: Negative for chest pain and palpitations.  Gastrointestinal: Negative for abdominal pain and vomiting.  Genitourinary: Negative for dysuria and hematuria.  Musculoskeletal: Positive for arthralgias. Negative for back pain.  Skin: Negative for color change and rash.  Neurological: Negative for seizures, syncope, weakness and numbness.  All other systems reviewed and are negative.    Physical Exam Triage Vital Signs ED Triage Vitals  Enc Vitals Group     BP 06/02/19 1109 (!) 163/91     Pulse Rate 06/02/19 1109 81     Resp 06/02/19 1109 16     Temp 06/02/19 1109 98 F (36.7 C)     Temp src --      SpO2 06/02/19 1109 100 %     Weight --      Height --      Head Circumference --      Peak Flow --  Pain Score 06/02/19  1111 6     Pain Loc --      Pain Edu? --      Excl. in [place]? --    No data found.  Updated Vital Signs BP (!) 163/91   Pulse 81   Temp 98 F (36.7 C)   Resp 16   SpO2 100%   Visual Acuity Right Eye Distance:   Left Eye Distance:   Bilateral Distance:    Right Eye Near:   Left Eye Near:    Bilateral Near:     Physical Exam Vitals signs and nursing note reviewed.  Constitutional:      Appearance: He is well-developed.  HENT:     Head: Normocephalic and atraumatic.  Eyes:     Conjunctiva/sclera: Conjunctivae normal.  Neck:     Musculoskeletal: Neck supple.  Cardiovascular:     Rate and Rhythm: Normal rate and regular rhythm.     Heart sounds: No murmur.  Pulmonary:     Effort: Pulmonary effort is normal. No respiratory distress.     Breath sounds: Normal breath sounds.  Abdominal:     Palpations: Abdomen is soft.     Tenderness: There is no abdominal tenderness.  Musculoskeletal:        General: No swelling, deformity or signs of injury.     Comments: Right shoulder: Range of motion limited by pain.  No bruising, erythema, wounds.  No tenderness to palpation. RUE: Strength 5/5.  Sensation intact.  2+ pulses.  Skin:    General: Skin is warm and dry.     Findings: No bruising, erythema or rash.  Neurological:     General: No focal deficit present.     Mental Status: He is alert.     Sensory: No sensory deficit.     Motor: No weakness.     Deep Tendon Reflexes: Reflexes normal.      UC Treatments / Results  Labs (all labs ordered are listed, but only abnormal results are displayed) Labs Reviewed - No data to display  EKG   Radiology No results found.  Procedures Procedures (including critical care time)  Medications Ordered in UC Medications  methylPREDNISolone sodium succinate (SOLU-MEDROL) 125 mg/2 mL injection 80 mg (80 mg Intramuscular Given 06/02/19 1222)  methylPREDNISolone sodium succinate (SOLU-MEDROL) 125 mg/2 mL injection (has no  administration in time range)    Initial Impression / Assessment and Plan / UC Course  I have reviewed the triage vital signs and the nursing notes.  Pertinent labs & imaging results that were available during my care of the patient were reviewed by me and considered in my medical decision making (see chart for details).   Right shoulder pain.  Diagnosed with impingement syndrome by orthopedic on 05/31/2019.  EKG unchanged from previous on 04/13/2019.  Treating with Solu-Medrol.  Patient given instructions to follow-up with orthopedics on Monday. Discussed case and plan of care with Dr. Mannie Stabile and he agrees.       Final Clinical Impressions(s) / UC Diagnoses   Final diagnoses:  Acute pain of right shoulder     Discharge Instructions     You were given an injection of a steroid today called Solu-Medrol.   Follow-up with [facility] on Monday to be seen next week.  Continue to take the medications as prescribed by the orthopedist.        ED Prescriptions    None     Controlled Substance Prescriptions [place] Controlled Substance Registry consulted?  Not Applicable   Sharion Balloon, NP 06/02/19 1241

## 2019-06-02 NOTE — ED Triage Notes (Signed)
Pt c/o R shoulder pain, states he saw his doctor who gave him some medicine that is not helping. Medicine was percocet. Pt states "it feels like a pinched nerve". Pt c/o soreness in his R shoulder, painful moving, and numbness and pinching in his R hand. Grip stregnth equal

## 2019-06-02 NOTE — Discharge Instructions (Addendum)
You were given an injection of a steroid today called Solu-Medrol.   Follow-up with [facility] on Monday to be seen next week.  Continue to take the medications as prescribed by the orthopedist.

## 2019-06-04 ENCOUNTER — Telehealth: Payer: Self-pay | Admitting: Internal Medicine

## 2019-06-04 DIAGNOSIS — M542 Cervicalgia: Secondary | ICD-10-CM | POA: Diagnosis not present

## 2019-06-04 NOTE — Telephone Encounter (Signed)
Pt scheduled  

## 2019-06-04 NOTE — Telephone Encounter (Signed)
Patient called [facility] on 06/02/19 at 9:33am stating that he has right shoulder pain and right hand is numb.  States he spoke with Dr. Edmonia Lynch the day prior who gave him Methocarbamol rx for spasms.  States pain is getting worse.  No shoulder injury.  No chest pain or SOB.  Hand numbness started day of triage call.  No right side deficit.  No headache.  Concussions 4-5 weeks ago.  Patient was told to see PCP within 24 hours. Looks like patient went to [facility].

## 2019-06-04 NOTE — Telephone Encounter (Signed)
Can get pt in with any provider in the next 24 hours?

## 2019-06-05 ENCOUNTER — Encounter: Payer: Self-pay | Admitting: Internal Medicine

## 2019-06-05 ENCOUNTER — Ambulatory Visit (INDEPENDENT_AMBULATORY_CARE_PROVIDER_SITE_OTHER): Payer: Medicare Other | Admitting: Internal Medicine

## 2019-06-05 ENCOUNTER — Other Ambulatory Visit: Payer: Self-pay

## 2019-06-05 DIAGNOSIS — M5412 Radiculopathy, cervical region: Secondary | ICD-10-CM | POA: Insufficient documentation

## 2019-06-05 DIAGNOSIS — I2510 Atherosclerotic heart disease of native coronary artery without angina pectoris: Secondary | ICD-10-CM

## 2019-06-05 MED ORDER — PREDNISONE 10 MG PO TABS: ORAL_TABLET | ORAL | 1 refills | Status: DC

## 2019-06-05 NOTE — Progress Notes (Signed)
Subjective:  Patient ID: [name], male    DOB: [birth year]  Age: 85 y.o. MRN: [id number]  CC: Shoulder Pain (Right x 1 week. Has had 2 different types of injections with no relieve)   HPI [name] presents for severe R shoulder pain x 1 wk. It is a dull severe ache -it hurts continuasly.  So far nothing has helped. He saw Dr Wynelle Link - had a shot in the shoulder. On Sat his R fingers #4 and 5 got numb -  He had a Depo-Medrol shot in the butt -it did not help. Yesterday he saw Dr Rolena Infante Rosanne Gutting No recent falls or injuries  C-spine MRI IMPRESSION: 1. 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 Resultant mild canal stenosis at C3-4 through C6-7. 2. Multifactorial degenerative changes with resultant multilevel foraminal narrowing as detailed above. Notable findings include progressive mild to moderate bilateral C3 foraminal stenosis, moderate left C4 foraminal narrowing, and mild to moderate bilateral C6 foraminal stenosis. 3. Prominent right-sided facet degeneration at C2-3 on the right with associated reactive marrow edema. Finding could serve as a source for neck pain. 4. Apparent asymmetric edema about the right posterior musculature of the upper back, primarily encompassing the partially visualized distal right levator scapulae muscle. Finding may be related to muscular injury/strain.   Electronically Signed   By: Jeannine Boga M.D.   On: 05/02/2017 21:54   Outpatient Medications Prior to Visit  Medication Sig Dispense Refill  . acetaminophen (TYLENOL) 500 MG tablet Take 1,000 mg by mouth every 6 (six) hours as needed.    [name] aspirin EC 81 MG tablet Take 81 mg by mouth daily.    [name] ELIQUIS 5 MG TABS tablet TAKE ONE TABLET TWICE DAILY 180 tablet 1  . escitalopram (LEXAPRO) 20 MG tablet Take 1 tablet (20 mg total) by mouth daily. 90 tablet 3  . Evolocumab (REPATHA SURECLICK) 300 MG/ML  SOAJ Inject 1 pen into the skin every 14 (fourteen) days. 2 pen 11  . ezetimibe (ZETIA) 10 MG tablet TAKE ONE-HALF TABLET DAILY 45 tablet 3  . fexofenadine (ALLEGRA) 180 MG tablet Take 180 mg by mouth daily as needed for allergies or rhinitis.     [name] lisinopril (ZESTRIL) 5 MG tablet Take 0.5 tablets (2.5 mg total) by mouth daily. 90 tablet 3  . LORazepam (ATIVAN) 0.5 MG tablet TAKE ONE OR TWO TABLETS TWICE DAILY AS NEEDED FOR ANXIETY 60 tablet 2  . memantine (NAMENDA) 10 MG tablet Take 1 tablet (10 mg total) by mouth 2 (two) times daily. 60 tablet 11  . metoprolol tartrate (LOPRESSOR) 25 MG tablet Take 1 tablet (25 mg total) by mouth 2 (two) times daily. (Patient taking differently: Take 25 mg by mouth daily. ) 180 tablet 2  . nitroGLYCERIN (NITROSTAT) 0.4 MG SL tablet ONE TABLET UNDER TONGUE AS NEEDED FOR CHEST PAIN. MAY REPEAT IN 5 MINUTES AND AGAIN IN 5 MINUTES (TOTAL OF 3 TABLETS) 25 tablet 6  . omeprazole (PRILOSEC) 40 MG capsule TAKE ONE CAPSULE EACH DAY 90 capsule 3  . ondansetron (ZOFRAN ODT) 4 MG disintegrating tablet Take 1 tablet (4 mg total) by mouth every 8 (eight) hours as needed for nausea. 10 tablet 0  . ondansetron (ZOFRAN) 4 MG tablet Take 1 tablet (4 mg total) by mouth every 8 (eight) hours as needed for nausea or vomiting. 20 tablet 0  . predniSONE (DELTASONE) 5 MG tablet Take 10 mg by mouth  every morning.   0  . Propylene Glycol (SYSTANE BALANCE OP) Place 2 drops into both eyes daily as needed (dry eyes).     [name] zolpidem (AMBIEN) 10 MG tablet TAKE 1/2 TO 1 TABLET AT BEDTIME AS NEEDED FOR SLEEP 30 tablet 3  . oxyCODONE-acetaminophen (PERCOCET/ROXICET) 5-325 MG tablet TAKE ONE TABLET EVERY EIGHT HOURS AS NEEDED FOR SEVERE PAIN (Patient not taking: Reported on 06/05/2019) 90 tablet 0   No facility-administered medications prior to visit.     ROS: Review of Systems  Constitutional: Positive for fatigue. Negative for appetite change and unexpected weight change.  HENT: Negative for  congestion, nosebleeds, sneezing, sore throat and trouble swallowing.   Eyes: Negative for itching and visual disturbance.  Respiratory: Negative for cough.   Cardiovascular: Negative for chest pain, palpitations and leg swelling.  Gastrointestinal: Negative for abdominal distention, blood in stool, diarrhea and nausea.  Genitourinary: Negative for frequency and hematuria.  Musculoskeletal: Positive for arthralgias, back pain, gait problem, neck pain and neck stiffness. Negative for joint swelling and myalgias.  Skin: Negative for rash.  Neurological: Negative for dizziness, tremors, speech difficulty and weakness.  Psychiatric/Behavioral: Positive for decreased concentration and sleep disturbance. Negative for agitation, dysphoric mood and suicidal ideas. The patient is nervous/anxious.     Objective:  BP (!) 160/90 (BP Location: Left Arm, Patient Position: Sitting, Cuff Size: Normal)   Pulse 60   Temp (!) 97.4 F (36.3 C) (Oral)   Ht 5\' 11"  (1.803 m)   Wt 184 lb (83.5 kg)   SpO2 96%   BMI 25.66 kg/m   BP Readings from Last 3 Encounters:  06/05/19 (!) 160/90  06/02/19 (!) 163/91  05/07/19 132/78    Wt Readings from Last 3 Encounters:  06/05/19 184 lb (83.5 kg)  05/07/19 181 lb (82.1 kg)  04/17/19 177 lb (80.3 kg)    Physical Exam Constitutional:      General: He is not in acute distress.    Appearance: He is well-developed.     Comments: NAD  Eyes:     Conjunctiva/sclera: Conjunctivae normal.     Pupils: Pupils are equal, round, and reactive to light.  Neck:     Musculoskeletal: Normal range of motion.     Thyroid: No thyromegaly.     Vascular: No JVD.  Cardiovascular:     Rate and Rhythm: Normal rate and regular rhythm.     Heart sounds: Normal heart sounds. No murmur. No friction rub. No gallop.   Pulmonary:     Effort: Pulmonary effort is normal. No respiratory distress.     Breath sounds: Normal breath sounds. No wheezing or rales.  Chest:     Chest wall: No  tenderness.  Abdominal:     General: Bowel sounds are normal. There is no distension.     Palpations: Abdomen is soft. There is no mass.     Tenderness: There is no abdominal tenderness. There is no guarding or rebound.  Musculoskeletal:        General: Tenderness present.  Lymphadenopathy:     Cervical: No cervical adenopathy.  Skin:    General: Skin is warm and dry.     Findings: No rash.  Neurological:     General: No focal deficit present.     Mental Status: He is alert.     Cranial Nerves: No cranial nerve deficit.     Motor: No weakness or abnormal muscle tone.     Coordination: Coordination normal.     Gait:  Gait normal.     Deep Tendon Reflexes: Reflexes are normal and symmetric.  Psychiatric:        Behavior: Behavior normal.        Thought Content: Thought content normal.        Judgment: Judgment normal.   No rash on the neck, shoulder, or the extremity Neck is stiff with decreased range of motion painful over the right deltoid  Lab Results  Component Value Date   WBC 7.2 04/13/2019   HGB 13.4 04/13/2019   HCT 40.1 04/13/2019   PLT 270 04/13/2019   GLUCOSE 131 (H) 04/13/2019   CHOL 190 12/29/2018   TRIG 239 (H) 12/29/2018   HDL 44 12/29/2018   LDLDIRECT 168.9 05/24/2013   LDLCALC 98 12/29/2018   ALT 14 04/13/2019   AST 18 04/13/2019   NA 141 04/13/2019   K 3.1 (L) 04/13/2019   CL 106 04/13/2019   CREATININE 0.85 04/13/2019   BUN 26 (H) 04/13/2019   CO2 22 04/13/2019   TSH 1.18 12/13/2017   PSA 2.46 02/21/2017   INR 0.92 09/13/2016   HGBA1C 6.4 08/30/2017    No results found.  Assessment & Plan:   There are no diagnoses linked to this encounter.   No orders of the defined types were placed in this encounter.    Follow-up: No follow-ups on file.  [name], MD

## 2019-06-05 NOTE — Assessment & Plan Note (Signed)
7/20 new C-spine MRI IMPRESSION: 1. 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 Resultant mild canal stenosis at C3-4 through C6-7. 2. Multifactorial degenerative changes with resultant multilevel foraminal narrowing as detailed above. Notable findings include progressive mild to moderate bilateral C3 foraminal stenosis, moderate left C4 foraminal narrowing, and mild to moderate bilateral C6 foraminal stenosis. 3. Prominent right-sided facet degeneration at C2-3 on the right with associated reactive marrow edema. Finding could serve as a source for neck pain. 4. Apparent asymmetric edema about the right posterior musculature of the upper back, primarily encompassing the partially visualized distal right levator scapulae muscle. Finding may be related to muscular injury/strain.   Electronically Signed   By: Jeannine Boga M.D.   On: 05/02/2017 21:54  MRI Oxy 1-2 prn Prednisone 10 mg: take 4 tabs a day x 3 days; then 3 tabs a day x 4 days; then 2 tabs a day x 4 days, then 1 tab a day x 6 days, then go back to old dose. Take pc.

## 2019-06-05 NOTE — Patient Instructions (Signed)

## 2019-06-06 DIAGNOSIS — M25571 Pain in right ankle and joints of right foot: Secondary | ICD-10-CM | POA: Diagnosis not present

## 2019-06-06 DIAGNOSIS — M546 Pain in thoracic spine: Secondary | ICD-10-CM | POA: Diagnosis not present

## 2019-06-06 DIAGNOSIS — M6240 Contracture of muscle, unspecified site: Secondary | ICD-10-CM | POA: Diagnosis not present

## 2019-06-06 DIAGNOSIS — M545 Low back pain: Secondary | ICD-10-CM | POA: Diagnosis not present

## 2019-06-06 DIAGNOSIS — M9901 Segmental and somatic dysfunction of cervical region: Secondary | ICD-10-CM | POA: Diagnosis not present

## 2019-06-06 DIAGNOSIS — M5136 Other intervertebral disc degeneration, lumbar region: Secondary | ICD-10-CM | POA: Diagnosis not present

## 2019-06-06 DIAGNOSIS — M25511 Pain in right shoulder: Secondary | ICD-10-CM | POA: Diagnosis not present

## 2019-06-06 DIAGNOSIS — M9903 Segmental and somatic dysfunction of lumbar region: Secondary | ICD-10-CM | POA: Diagnosis not present

## 2019-06-06 DIAGNOSIS — M791 Myalgia, unspecified site: Secondary | ICD-10-CM | POA: Diagnosis not present

## 2019-06-06 DIAGNOSIS — M5124 Other intervertebral disc displacement, thoracic region: Secondary | ICD-10-CM | POA: Diagnosis not present

## 2019-06-11 ENCOUNTER — Other Ambulatory Visit: Payer: Self-pay

## 2019-06-11 DIAGNOSIS — M9903 Segmental and somatic dysfunction of lumbar region: Secondary | ICD-10-CM | POA: Diagnosis not present

## 2019-06-11 DIAGNOSIS — M545 Low back pain: Secondary | ICD-10-CM | POA: Diagnosis not present

## 2019-06-11 DIAGNOSIS — M5136 Other intervertebral disc degeneration, lumbar region: Secondary | ICD-10-CM | POA: Diagnosis not present

## 2019-06-11 DIAGNOSIS — M5124 Other intervertebral disc displacement, thoracic region: Secondary | ICD-10-CM | POA: Diagnosis not present

## 2019-06-11 DIAGNOSIS — M25571 Pain in right ankle and joints of right foot: Secondary | ICD-10-CM | POA: Diagnosis not present

## 2019-06-11 DIAGNOSIS — Z20822 Contact with and (suspected) exposure to covid-19: Secondary | ICD-10-CM

## 2019-06-11 DIAGNOSIS — M25511 Pain in right shoulder: Secondary | ICD-10-CM | POA: Diagnosis not present

## 2019-06-11 DIAGNOSIS — M9901 Segmental and somatic dysfunction of cervical region: Secondary | ICD-10-CM | POA: Diagnosis not present

## 2019-06-11 DIAGNOSIS — M546 Pain in thoracic spine: Secondary | ICD-10-CM | POA: Diagnosis not present

## 2019-06-11 DIAGNOSIS — M6240 Contracture of muscle, unspecified site: Secondary | ICD-10-CM | POA: Diagnosis not present

## 2019-06-11 DIAGNOSIS — M791 Myalgia, unspecified site: Secondary | ICD-10-CM | POA: Diagnosis not present

## 2019-06-13 LAB — NOVEL CORONAVIRUS, NAA: SARS-CoV-2, NAA: NOT DETECTED

## 2019-06-15 DIAGNOSIS — M791 Myalgia, unspecified site: Secondary | ICD-10-CM | POA: Diagnosis not present

## 2019-06-15 DIAGNOSIS — M542 Cervicalgia: Secondary | ICD-10-CM | POA: Diagnosis not present

## 2019-06-15 DIAGNOSIS — M6240 Contracture of muscle, unspecified site: Secondary | ICD-10-CM | POA: Diagnosis not present

## 2019-06-15 DIAGNOSIS — M9903 Segmental and somatic dysfunction of lumbar region: Secondary | ICD-10-CM | POA: Diagnosis not present

## 2019-06-15 DIAGNOSIS — M9901 Segmental and somatic dysfunction of cervical region: Secondary | ICD-10-CM | POA: Diagnosis not present

## 2019-06-15 DIAGNOSIS — M5136 Other intervertebral disc degeneration, lumbar region: Secondary | ICD-10-CM | POA: Diagnosis not present

## 2019-06-18 ENCOUNTER — Encounter: Payer: Self-pay | Admitting: Internal Medicine

## 2019-06-18 ENCOUNTER — Ambulatory Visit (INDEPENDENT_AMBULATORY_CARE_PROVIDER_SITE_OTHER): Payer: Medicare Other | Admitting: Internal Medicine

## 2019-06-18 ENCOUNTER — Other Ambulatory Visit: Payer: Self-pay

## 2019-06-18 DIAGNOSIS — M9901 Segmental and somatic dysfunction of cervical region: Secondary | ICD-10-CM | POA: Diagnosis not present

## 2019-06-18 DIAGNOSIS — M0609 Rheumatoid arthritis without rheumatoid factor, multiple sites: Secondary | ICD-10-CM | POA: Diagnosis not present

## 2019-06-18 DIAGNOSIS — M5136 Other intervertebral disc degeneration, lumbar region: Secondary | ICD-10-CM | POA: Diagnosis not present

## 2019-06-18 DIAGNOSIS — M9903 Segmental and somatic dysfunction of lumbar region: Secondary | ICD-10-CM | POA: Diagnosis not present

## 2019-06-18 DIAGNOSIS — I2510 Atherosclerotic heart disease of native coronary artery without angina pectoris: Secondary | ICD-10-CM | POA: Diagnosis not present

## 2019-06-18 DIAGNOSIS — M791 Myalgia, unspecified site: Secondary | ICD-10-CM | POA: Diagnosis not present

## 2019-06-18 DIAGNOSIS — S060X0A Concussion without loss of consciousness, initial encounter: Secondary | ICD-10-CM | POA: Diagnosis not present

## 2019-06-18 DIAGNOSIS — M6240 Contracture of muscle, unspecified site: Secondary | ICD-10-CM | POA: Diagnosis not present

## 2019-06-18 DIAGNOSIS — M546 Pain in thoracic spine: Secondary | ICD-10-CM | POA: Diagnosis not present

## 2019-06-18 DIAGNOSIS — M5412 Radiculopathy, cervical region: Secondary | ICD-10-CM | POA: Diagnosis not present

## 2019-06-18 DIAGNOSIS — R413 Other amnesia: Secondary | ICD-10-CM | POA: Diagnosis not present

## 2019-06-18 NOTE — Assessment & Plan Note (Addendum)
Neck, shoulder pain - much better on exam C spine MRI pending Will consult Neurology

## 2019-06-18 NOTE — Assessment & Plan Note (Addendum)
Doing well. No more accidents.Evan KitchenMarland Todd

## 2019-06-18 NOTE — Assessment & Plan Note (Addendum)
Worse per [name] F/u consult  w/Dr Elnoria Howard

## 2019-06-18 NOTE — Progress Notes (Signed)
Subjective:  Patient ID: [name], male    DOB: [birth year]  Age: 85 y.o. MRN: [id number]  CC: No chief complaint on file.   HPI [name] [facility] presents for R shoulder arm pain, neck pain - 10% better, however, he is much more comfortable and can move it better than last time. Using Tylenol.[name] gives him percocet prn. No falls. Seeing a Restaurant manager, fast food... C/o worsening confusion, memory, dysphoria - per [name]  Outpatient Medications Prior to Visit  Medication Sig Dispense Refill  . acetaminophen (TYLENOL) 500 MG tablet Take 1,000 mg by mouth every 6 (six) hours as needed.    [name] aspirin EC 81 MG tablet Take 81 mg by mouth daily.    [name] ELIQUIS 5 MG TABS tablet TAKE ONE TABLET TWICE DAILY 180 tablet 1  . escitalopram (LEXAPRO) 20 MG tablet Take 1 tablet (20 mg total) by mouth daily. 90 tablet 3  . Evolocumab (REPATHA SURECLICK) 595 MG/ML SOAJ Inject 1 pen into the skin every 14 (fourteen) days. 2 pen 11  . ezetimibe (ZETIA) 10 MG tablet TAKE ONE-HALF TABLET DAILY 45 tablet 3  . fexofenadine (ALLEGRA) 180 MG tablet Take 180 mg by mouth daily as needed for allergies or rhinitis.     [name] lisinopril (ZESTRIL) 5 MG tablet Take 0.5 tablets (2.5 mg total) by mouth daily. 90 tablet 3  . LORazepam (ATIVAN) 0.5 MG tablet TAKE ONE OR TWO TABLETS TWICE DAILY AS NEEDED FOR ANXIETY 60 tablet 2  . memantine (NAMENDA) 10 MG tablet Take 1 tablet (10 mg total) by mouth 2 (two) times daily. 60 tablet 11  . metoprolol tartrate (LOPRESSOR) 25 MG tablet Take 1 tablet (25 mg total) by mouth 2 (two) times daily. (Patient taking differently: Take 25 mg by mouth daily. ) 180 tablet 2  . nitroGLYCERIN (NITROSTAT) 0.4 MG SL tablet ONE TABLET UNDER TONGUE AS NEEDED FOR CHEST PAIN. MAY REPEAT IN 5 MINUTES AND AGAIN IN 5 MINUTES (TOTAL OF 3 TABLETS) 25 tablet 6  . omeprazole (PRILOSEC) 40 MG capsule TAKE ONE CAPSULE EACH DAY 90 capsule 3  . ondansetron (ZOFRAN ODT) 4 MG disintegrating tablet Take 1 tablet (4 mg total) by mouth  every 8 (eight) hours as needed for nausea. 10 tablet 0  . ondansetron (ZOFRAN) 4 MG tablet Take 1 tablet (4 mg total) by mouth every 8 (eight) hours as needed for nausea or vomiting. 20 tablet 0  . oxyCODONE-acetaminophen (PERCOCET/ROXICET) 5-325 MG tablet TAKE ONE TABLET EVERY EIGHT HOURS AS NEEDED FOR SEVERE PAIN 90 tablet 0  . predniSONE (DELTASONE) 10 MG tablet Prednisone 10 mg: take 4 tabs a day x 3 days; then 3 tabs a day x 4 days; then 2 tabs a day x 10 days, then 10 mg a day as before. Take pc. 44 tablet 1  . predniSONE (DELTASONE) 5 MG tablet Take 10 mg by mouth every morning.   0  . Propylene Glycol (SYSTANE BALANCE OP) Place 2 drops into both eyes daily as needed (dry eyes).     [name] zolpidem (AMBIEN) 10 MG tablet TAKE 1/2 TO 1 TABLET AT BEDTIME AS NEEDED FOR SLEEP 30 tablet 3   No facility-administered medications prior to visit.     ROS: Review of Systems  Constitutional: Negative for appetite change, fatigue and unexpected weight change.  HENT: Negative for congestion, nosebleeds, sneezing, sore throat and trouble swallowing.   Eyes: Negative for itching and visual disturbance.  Respiratory: Negative for cough.   Cardiovascular: Negative for chest  pain, palpitations and leg swelling.  Gastrointestinal: Negative for abdominal distention, blood in stool, diarrhea and nausea.  Genitourinary: Negative for frequency and hematuria.  Musculoskeletal: Positive for arthralgias, neck pain and neck stiffness. Negative for back pain, gait problem and joint swelling.  Skin: Negative for rash.  Neurological: Negative for dizziness, tremors, speech difficulty and weakness.  Psychiatric/Behavioral: Negative for agitation, dysphoric mood, sleep disturbance and suicidal ideas. The patient is not nervous/anxious.     Objective:  BP (!) 150/90 (BP Location: Left Arm, Patient Position: Sitting, Cuff Size: Normal)   Pulse 80   Temp 98.4 F (36.9 C) (Oral)   Ht 5\' 11"  (1.803 m)   Wt 182 lb (82.6  kg)   SpO2 96%   BMI 25.38 kg/m   BP Readings from Last 3 Encounters:  06/18/19 (!) 150/90  06/05/19 (!) 160/90  06/02/19 (!) 163/91    Wt Readings from Last 3 Encounters:  06/18/19 182 lb (82.6 kg)  06/05/19 184 lb (83.5 kg)  05/07/19 181 lb (82.1 kg)    Physical Exam Constitutional:      General: He is not in acute distress.    Appearance: He is well-developed.     Comments: NAD  Eyes:     Conjunctiva/sclera: Conjunctivae normal.     Pupils: Pupils are equal, round, and reactive to light.  Neck:     Musculoskeletal: Normal range of motion.     Thyroid: No thyromegaly.     Vascular: No JVD.  Cardiovascular:     Rate and Rhythm: Normal rate and regular rhythm.     Heart sounds: Normal heart sounds. No murmur. No friction rub. No gallop.   Pulmonary:     Effort: Pulmonary effort is normal. No respiratory distress.     Breath sounds: Normal breath sounds. No wheezing or rales.  Chest:     Chest wall: No tenderness.  Abdominal:     General: Bowel sounds are normal. There is no distension.     Palpations: Abdomen is soft. There is no mass.     Tenderness: There is no abdominal tenderness. There is no guarding or rebound.  Musculoskeletal: Normal range of motion.        General: No tenderness.  Lymphadenopathy:     Cervical: No cervical adenopathy.  Skin:    General: Skin is warm and dry.     Findings: No rash.  Neurological:     Mental Status: He is alert and oriented to person, place, and time.     Cranial Nerves: No cranial nerve deficit.     Motor: No abnormal muscle tone.     Coordination: Coordination normal.     Gait: Gait normal.     Deep Tendon Reflexes: Reflexes are normal and symmetric.  Psychiatric:        Behavior: Behavior normal.        Thought Content: Thought content normal.        Judgment: Judgment normal.   neck, shoulder - much better on exam Alert, cooperative, irritable at times  Lab Results  Component Value Date   WBC 7.2 04/13/2019    HGB 13.4 04/13/2019   HCT 40.1 04/13/2019   PLT 270 04/13/2019   GLUCOSE 131 (H) 04/13/2019   CHOL 190 12/29/2018   TRIG 239 (H) 12/29/2018   HDL 44 12/29/2018   LDLDIRECT 168.9 05/24/2013   LDLCALC 98 12/29/2018   ALT 14 04/13/2019   AST 18 04/13/2019   NA 141 04/13/2019   K 3.1 (  L) 04/13/2019   CL 106 04/13/2019   CREATININE 0.85 04/13/2019   BUN 26 (H) 04/13/2019   CO2 22 04/13/2019   TSH 1.18 12/13/2017   PSA 2.46 02/21/2017   INR 0.92 09/13/2016   HGBA1C 6.4 08/30/2017    No results found.  Assessment & Plan:   There are no diagnoses linked to this encounter.   No orders of the defined types were placed in this encounter.    Follow-up: No follow-ups on file.  [name], MD

## 2019-06-22 DIAGNOSIS — M9901 Segmental and somatic dysfunction of cervical region: Secondary | ICD-10-CM | POA: Diagnosis not present

## 2019-06-22 DIAGNOSIS — M5136 Other intervertebral disc degeneration, lumbar region: Secondary | ICD-10-CM | POA: Diagnosis not present

## 2019-06-22 DIAGNOSIS — M791 Myalgia, unspecified site: Secondary | ICD-10-CM | POA: Diagnosis not present

## 2019-06-22 DIAGNOSIS — M9903 Segmental and somatic dysfunction of lumbar region: Secondary | ICD-10-CM | POA: Diagnosis not present

## 2019-06-22 DIAGNOSIS — M542 Cervicalgia: Secondary | ICD-10-CM | POA: Diagnosis not present

## 2019-06-22 DIAGNOSIS — M6240 Contracture of muscle, unspecified site: Secondary | ICD-10-CM | POA: Diagnosis not present

## 2019-06-25 ENCOUNTER — Ambulatory Visit (INDEPENDENT_AMBULATORY_CARE_PROVIDER_SITE_OTHER): Payer: Medicare Other | Admitting: Internal Medicine

## 2019-06-25 ENCOUNTER — Telehealth: Payer: Self-pay | Admitting: Internal Medicine

## 2019-06-25 ENCOUNTER — Other Ambulatory Visit: Payer: Self-pay

## 2019-06-25 ENCOUNTER — Encounter: Payer: Self-pay | Admitting: Internal Medicine

## 2019-06-25 DIAGNOSIS — M9901 Segmental and somatic dysfunction of cervical region: Secondary | ICD-10-CM | POA: Diagnosis not present

## 2019-06-25 DIAGNOSIS — Z20828 Contact with and (suspected) exposure to other viral communicable diseases: Secondary | ICD-10-CM

## 2019-06-25 DIAGNOSIS — K1370 Unspecified lesions of oral mucosa: Secondary | ICD-10-CM | POA: Diagnosis not present

## 2019-06-25 DIAGNOSIS — M25519 Pain in unspecified shoulder: Secondary | ICD-10-CM

## 2019-06-25 DIAGNOSIS — M5136 Other intervertebral disc degeneration, lumbar region: Secondary | ICD-10-CM | POA: Diagnosis not present

## 2019-06-25 DIAGNOSIS — Z20822 Contact with and (suspected) exposure to covid-19: Secondary | ICD-10-CM

## 2019-06-25 DIAGNOSIS — M791 Myalgia, unspecified site: Secondary | ICD-10-CM | POA: Diagnosis not present

## 2019-06-25 DIAGNOSIS — M546 Pain in thoracic spine: Secondary | ICD-10-CM | POA: Diagnosis not present

## 2019-06-25 DIAGNOSIS — M6240 Contracture of muscle, unspecified site: Secondary | ICD-10-CM | POA: Diagnosis not present

## 2019-06-25 DIAGNOSIS — R110 Nausea: Secondary | ICD-10-CM

## 2019-06-25 DIAGNOSIS — M9903 Segmental and somatic dysfunction of lumbar region: Secondary | ICD-10-CM | POA: Diagnosis not present

## 2019-06-25 MED ORDER — ONDANSETRON HCL 4 MG PO TABS: 4.0000 mg | ORAL_TABLET | Freq: Three times a day (TID) | ORAL | 0 refills | Status: DC | PRN

## 2019-06-25 MED ORDER — TRIAMCINOLONE ACETONIDE 0.1 % MT PSTE: 1.0000 "application " | PASTE | Freq: Two times a day (BID) | OROMUCOSAL | 12 refills | Status: DC

## 2019-06-25 NOTE — Assessment & Plan Note (Signed)
Testing for covid-19 and talked about potential timeline and symptoms. Advised to take tylenol for pain and use percocet rarely for pain if needed. Rx zofran for nausea and kenalog ointment for sores in mouth.

## 2019-06-25 NOTE — Telephone Encounter (Signed)
Patients spouse call team health 06/24/19 at 11:52am.  States that patient was having shoulder pain and diarrhea.  Denies fever. Patient is scheduled for doxy 8/10 with Dr. Sharlet Salina.

## 2019-06-25 NOTE — Progress Notes (Signed)
Virtual Visit via Video Note  I connected with [name] on 06/25/19 at 10:00 AM EDT by a video enabled telemedicine application and verified that I am speaking with the correct person using two identifiers.  The patient and the provider were at separate locations throughout the entire encounter.   I discussed the limitations of evaluation and management by telemedicine and the availability of in person appointments. The patient expressed understanding and agreed to proceed.  History of Present Illness: The patient is a 85 y.o. man with visit for aching in his joints and especially his shoulder. Started about 2-3 days ago but worsened yesterday. Has also some sores in his mouth which he gets from time to time usually when sick. Normally these just go away on their own. Denies any fevers and has been checking temperatures. Overall it is not improving and may be worsening slightly. Denies cough or SOB. Some sinus drainage, denies headaches. Has tried tylenol for the pain which is not helping. Some nausea as well but no vomiting.   Observations/Objective: Appearance: normal, breathing appears normal, casual grooming, abdomen does not appear distended, throat normal, mental status is A and O times 3  Assessment and Plan: See problem oriented charting  Follow Up Instructions: covid-19 testing, rx zofran and kenalog ointment for mouth  Visit time 25 minutes: greater than 50% of that time was spent in face to face counseling and coordination of care with the patient: counseled about need to quarantine after testing as well as typical course and timeline for covid-19.   I discussed the assessment and treatment plan with the patient. The patient was provided an opportunity to ask questions and all were answered. The patient agreed with the plan and demonstrated an understanding of the instructions.   The patient was advised to call back or seek an in-person evaluation if the symptoms worsen or if the  condition fails to improve as anticipated.  Hoyt Koch, MD

## 2019-06-26 ENCOUNTER — Encounter: Payer: Self-pay | Admitting: Neurology

## 2019-06-26 ENCOUNTER — Telehealth (INDEPENDENT_AMBULATORY_CARE_PROVIDER_SITE_OTHER): Payer: Medicare Other | Admitting: Neurology

## 2019-06-26 ENCOUNTER — Other Ambulatory Visit: Payer: Self-pay

## 2019-06-26 VITALS — Ht 71.0 in | Wt 184.0 lb

## 2019-06-26 DIAGNOSIS — F0390 Unspecified dementia without behavioral disturbance: Secondary | ICD-10-CM | POA: Diagnosis not present

## 2019-06-26 DIAGNOSIS — M5412 Radiculopathy, cervical region: Secondary | ICD-10-CM

## 2019-06-26 DIAGNOSIS — F03A0 Unspecified dementia, mild, without behavioral disturbance, psychotic disturbance, mood disturbance, and anxiety: Secondary | ICD-10-CM

## 2019-06-26 LAB — NOVEL CORONAVIRUS, NAA: SARS-CoV-2, NAA: NOT DETECTED

## 2019-06-26 NOTE — Progress Notes (Addendum)
Virtual Visit via Telephone Note The purpose of this virtual visit is to provide medical care while limiting exposure to the novel coronavirus.    Consent was obtained for phone visit:  Yes.   Answered questions that patient had about telehealth interaction:  Yes.   I discussed the limitations, risks, security and privacy concerns of performing an evaluation and management service by telephone. I also discussed with the patient that there may be a patient responsible charge related to this service. The patient expressed understanding and agreed to proceed.  Pt location: Home Physician Location: office Name of referring provider:  Plotnikov, Evie Lacks, MD I connected with .[name] at patients initiation/request on 06/26/2019 at  3:30 PM EDT by telephone and verified that I am speaking with the correct person using two identifiers.  Pt MRN:  [id number] Pt DOB:  1933/04/10   History of Present Illness:  The patient had a phone visit on 06/26/2019. He is alone for today's visit. He reports he is doing well, he has not noticed any memory changes. He states he forgets his medications sometimes and his wife keeps an eye on it, "she has me on a chain." He is not taking Donepezil and listed to be taking Memantine 10mg  BID. He denies any side effects to medications. He denies getting lost driving. His wife manages finances. He denies any pain in his right arm/shoulder today, he states it bothers him from time to time and he has had relief from visiting the chiropractor. He has occasional numbness in his right 4th and 5th digit. He had right-sided neck pain which is not bothering him right now. He denies any falls. Left arm and legs are fine.   History on Initial Assessment 04/11/2019: This is an 85 year old right-handed man with a history of hypertension, hyperlipidemia, OSA on CPAP, CAD, presenting for evaluation of worsening memory. He feels his memory is okay, he gets confused some, "my wife tells  me I forget things." He makes jokes several times during the visit. His wife started noticing changes the beginning of the year. He started having personality changes where he would just explode for no reason. He was started on Lexapro which has significantly helped. She has noticed short term memory issues, and was missing bills that she had to take over in January. He was also starting to miss medications, his wife has started helping. Last night he was very concerned that he had not taken his medications. He has gotten lost driving, last week he could not remember what errand he went to do and was driving around. He could not remember where he would be going, but then figures it out. He repeatedly says he misplaced his hearing aids. His wife was driving yesterday and they brought her car to be serviced, after going to another place, he could not remember that they had brought her car in earlier. He had an unusual episode last week when he came upstairs and told his wife that they had to go and leave now, he was dressed in 2 pairs of boxer shorts and his shirt on backward (unusual, he is usually a fastidious dresser per wife), ready to go somewhere late at night. She found that he had pulled out his clothes and medications and wrapped his medications in his khaki pants. She directed him to bed and he went to sleep. He saw his PCP and had normal bloodwork and urinalysis. I personally reviewed head CT without contrast done  5/26 which did not show any acute changes, there was diffuse atrophy and chronic microvascular disease. His wife notes he is much more alert in the daytime and more confused at night. He was started on Donepezil 5mg  daily, which he is tolerating without side effects. There is no family history of dementia, no history of significant head injuries. He usually has 1-2 alcohol drinks at night.   He denies any headaches, dizziness (except upon standing quickly), diplopia, dysarthria/dysphagia,  neck/back pain, focal numbness/tingling/weakness, bowel dysfunction, anosmia. He has infrequent incontinence. He has had right hand shaking occasionally around 3-4 times a week. He has a history of sleepwalking. His wife has not noticed any staring/unresponsive episodes. No paranoia or hallucinations. He has trouble sleeping and takes 1/2 tablet of Ambien. He has fallen a coupld of times, last fall was 3 weeks ago.   Outpatient Encounter Medications as of 06/26/2019  Medication Sig  . acetaminophen (TYLENOL) 500 MG tablet Take 1,000 mg by mouth every 6 (six) hours as needed.  [name] aspirin EC 81 MG tablet Take 81 mg by mouth daily.  [name] ELIQUIS 5 MG TABS tablet TAKE ONE TABLET TWICE DAILY  . escitalopram (LEXAPRO) 20 MG tablet Take 1 tablet (20 mg total) by mouth daily.  . Evolocumab (REPATHA SURECLICK) 979 MG/ML SOAJ Inject 1 pen into the skin every 14 (fourteen) days.  [name] ezetimibe (ZETIA) 10 MG tablet TAKE ONE-HALF TABLET DAILY  . fexofenadine (ALLEGRA) 180 MG tablet Take 180 mg by mouth daily as needed for allergies or rhinitis.   [name] lisinopril (ZESTRIL) 5 MG tablet Take 0.5 tablets (2.5 mg total) by mouth daily.  [name] LORazepam (ATIVAN) 0.5 MG tablet TAKE ONE OR TWO TABLETS TWICE DAILY AS NEEDED FOR ANXIETY  . memantine (NAMENDA) 10 MG tablet Take 1 tablet (10 mg total) by mouth 2 (two) times daily.  . metoprolol tartrate (LOPRESSOR) 25 MG tablet Take 1 tablet (25 mg total) by mouth 2 (two) times daily. (Patient taking differently: Take 25 mg by mouth daily. )  . nitroGLYCERIN (NITROSTAT) 0.4 MG SL tablet ONE TABLET UNDER TONGUE AS NEEDED FOR CHEST PAIN. MAY REPEAT IN 5 MINUTES AND AGAIN IN 5 MINUTES (TOTAL OF 3 TABLETS)  . omeprazole (PRILOSEC) 40 MG capsule TAKE ONE CAPSULE EACH DAY  . ondansetron (ZOFRAN) 4 MG tablet Take 1 tablet (4 mg total) by mouth every 8 (eight) hours as needed for nausea or vomiting.  [name] oxyCODONE-acetaminophen (PERCOCET/ROXICET) 5-325 MG tablet TAKE ONE TABLET EVERY EIGHT HOURS  AS NEEDED FOR SEVERE PAIN  . Propylene Glycol (SYSTANE BALANCE OP) Place 2 drops into both eyes daily as needed (dry eyes).   . triamcinolone (KENALOG) 0.1 % paste Use as directed 1 application in the mouth or throat 2 (two) times daily.  [name] zolpidem (AMBIEN) 10 MG tablet TAKE 1/2 TO 1 TABLET AT BEDTIME AS NEEDED FOR SLEEP  .    [name]     No facility-administered encounter medications on file as of 06/26/2019.       Observations/Objective:  Limited due to nature of phone visit. Patient is awake, alert, no dysarthria or confusion noted. Montreal Cognitive Assessment  04/11/2019  Visuospatial/ Executive (0/5) 3  Naming (0/3) 3  Attention: Read list of digits (0/2) 2  Attention: Read list of letters (0/1) 1  Attention: Serial 7 subtraction starting at 100 (0/3) 3  Language: Repeat phrase (0/2) 1  Language : Fluency (0/1) 1  Abstraction (0/2) 2  Delayed Recall (0/5) 2  Orientation (0/6) 6  Total 24    Assessment and Plan:   This is a pleasant 85 yo RH man with a history of hypertension, hyperlipidemia, OSA on CPAP, CAD, mild dementia. He is alone for today's phone visit, MOCA score 2 months ago was 24/30. He presents today for worsening memory and radiculopathy. He denies any current pain in his neck or shoulder, he has occasional numbness in the right 4th and 5th digits. His wife is not present today to provide additional information about memory concerns, he reports he is doing fine. I will get in touch with his wife. He has been switched to Memantine 10mg  BID, he is also on Lexapro 20mg  daily, continue current medications.    Follow Up Instructions:   -I discussed the assessment and treatment plan with the patient. The patient was provided an opportunity to ask questions and all were answered. The patient agreed with the plan and demonstrated an understanding of the instructions.   The patient was advised to call back or seek an in-person evaluation if the symptoms worsen or if the  condition fails to improve as anticipated.     Cameron Sprang, MD

## 2019-06-27 ENCOUNTER — Telehealth: Payer: Self-pay | Admitting: Neurology

## 2019-06-27 NOTE — Telephone Encounter (Signed)
Wife left VM about wanting more info regarding a referral that was supposed to be sent on patient. Please give her a call back. Thanks!

## 2019-06-28 NOTE — Telephone Encounter (Signed)
Left message to return call 

## 2019-06-29 DIAGNOSIS — M9903 Segmental and somatic dysfunction of lumbar region: Secondary | ICD-10-CM | POA: Diagnosis not present

## 2019-06-29 DIAGNOSIS — M9901 Segmental and somatic dysfunction of cervical region: Secondary | ICD-10-CM | POA: Diagnosis not present

## 2019-06-29 DIAGNOSIS — M5136 Other intervertebral disc degeneration, lumbar region: Secondary | ICD-10-CM | POA: Diagnosis not present

## 2019-06-29 DIAGNOSIS — M542 Cervicalgia: Secondary | ICD-10-CM | POA: Diagnosis not present

## 2019-06-29 DIAGNOSIS — M6240 Contracture of muscle, unspecified site: Secondary | ICD-10-CM | POA: Diagnosis not present

## 2019-06-29 DIAGNOSIS — M791 Myalgia, unspecified site: Secondary | ICD-10-CM | POA: Diagnosis not present

## 2019-06-29 NOTE — Telephone Encounter (Signed)
Left message to return call 

## 2019-06-29 NOTE — Telephone Encounter (Signed)
Wife left msg returning your call. Thanks!

## 2019-07-02 ENCOUNTER — Telehealth: Payer: Self-pay | Admitting: Neurology

## 2019-07-02 DIAGNOSIS — M542 Cervicalgia: Secondary | ICD-10-CM | POA: Diagnosis not present

## 2019-07-02 DIAGNOSIS — M5124 Other intervertebral disc displacement, thoracic region: Secondary | ICD-10-CM | POA: Diagnosis not present

## 2019-07-02 DIAGNOSIS — M5136 Other intervertebral disc degeneration, lumbar region: Secondary | ICD-10-CM | POA: Diagnosis not present

## 2019-07-02 DIAGNOSIS — M25511 Pain in right shoulder: Secondary | ICD-10-CM | POA: Diagnosis not present

## 2019-07-02 DIAGNOSIS — M9903 Segmental and somatic dysfunction of lumbar region: Secondary | ICD-10-CM | POA: Diagnosis not present

## 2019-07-02 DIAGNOSIS — M6240 Contracture of muscle, unspecified site: Secondary | ICD-10-CM | POA: Diagnosis not present

## 2019-07-02 DIAGNOSIS — M9901 Segmental and somatic dysfunction of cervical region: Secondary | ICD-10-CM | POA: Diagnosis not present

## 2019-07-02 DIAGNOSIS — M791 Myalgia, unspecified site: Secondary | ICD-10-CM | POA: Diagnosis not present

## 2019-07-02 DIAGNOSIS — M546 Pain in thoracic spine: Secondary | ICD-10-CM | POA: Diagnosis not present

## 2019-07-02 NOTE — Telephone Encounter (Signed)
Patient's wife is calling in about wanting to speak to someone about spousal resources for the dementia DX regarding husband and she said she thought Dr. Delice Lesch was trying to get in touch with her. Thanks!

## 2019-07-02 NOTE — Telephone Encounter (Signed)
Sees Plotnikov  Had virtual with Delice Lesch  In third stage of Dementia  Delice Lesch talked to pt and wanted to speak to wife but she was not available  Wife states that memory is worse, pt is very forgettful. Wants to know where to go, how to handle his dementia. Wife is anxious  Does he need to be on all of the meds that he is taking  Pt having terrible dreams  Restless at night. Woke up during the night ran into wall ended up with a concussion. That has been evaluated  Not feeling well, no fever but has a lot of diarrhea. Started on probiotic, loosing weight, not eating. In bed a lot ( 8 hours total)  [place]? Referral?

## 2019-07-04 ENCOUNTER — Ambulatory Visit
Admission: RE | Admit: 2019-07-04 | Discharge: 2019-07-04 | Disposition: A | Discharge status: Home / Self Care | Payer: Medicare Other | Source: Ambulatory Visit | Attending: Internal Medicine | Admitting: Internal Medicine

## 2019-07-04 ENCOUNTER — Other Ambulatory Visit: Payer: Self-pay

## 2019-07-04 DIAGNOSIS — M4802 Spinal stenosis, cervical region: Secondary | ICD-10-CM | POA: Diagnosis not present

## 2019-07-04 DIAGNOSIS — M5412 Radiculopathy, cervical region: Secondary | ICD-10-CM

## 2019-07-04 NOTE — Telephone Encounter (Signed)
Called wife's listed number, no answer. Left VM

## 2019-07-05 DIAGNOSIS — L570 Actinic keratosis: Secondary | ICD-10-CM | POA: Diagnosis not present

## 2019-07-05 DIAGNOSIS — L821 Other seborrheic keratosis: Secondary | ICD-10-CM | POA: Diagnosis not present

## 2019-07-05 DIAGNOSIS — D692 Other nonthrombocytopenic purpura: Secondary | ICD-10-CM | POA: Diagnosis not present

## 2019-07-05 DIAGNOSIS — Z85828 Personal history of other malignant neoplasm of skin: Secondary | ICD-10-CM | POA: Diagnosis not present

## 2019-07-05 DIAGNOSIS — D1801 Hemangioma of skin and subcutaneous tissue: Secondary | ICD-10-CM | POA: Diagnosis not present

## 2019-07-09 ENCOUNTER — Other Ambulatory Visit: Payer: Self-pay

## 2019-07-09 ENCOUNTER — Ambulatory Visit (INDEPENDENT_AMBULATORY_CARE_PROVIDER_SITE_OTHER): Payer: Medicare Other | Admitting: Internal Medicine

## 2019-07-09 ENCOUNTER — Encounter: Payer: Self-pay | Admitting: Internal Medicine

## 2019-07-09 VITALS — BP 112/78 | HR 84 | Temp 97.8°F | Ht 71.0 in | Wt 182.0 lb

## 2019-07-09 DIAGNOSIS — M542 Cervicalgia: Secondary | ICD-10-CM

## 2019-07-09 DIAGNOSIS — I2510 Atherosclerotic heart disease of native coronary artery without angina pectoris: Secondary | ICD-10-CM

## 2019-07-09 DIAGNOSIS — M6240 Contracture of muscle, unspecified site: Secondary | ICD-10-CM | POA: Diagnosis not present

## 2019-07-09 DIAGNOSIS — I480 Paroxysmal atrial fibrillation: Secondary | ICD-10-CM | POA: Diagnosis not present

## 2019-07-09 DIAGNOSIS — F4321 Adjustment disorder with depressed mood: Secondary | ICD-10-CM | POA: Diagnosis not present

## 2019-07-09 DIAGNOSIS — M9901 Segmental and somatic dysfunction of cervical region: Secondary | ICD-10-CM | POA: Diagnosis not present

## 2019-07-09 DIAGNOSIS — K130 Diseases of lips: Secondary | ICD-10-CM | POA: Diagnosis not present

## 2019-07-09 DIAGNOSIS — R413 Other amnesia: Secondary | ICD-10-CM | POA: Diagnosis not present

## 2019-07-09 DIAGNOSIS — M50223 Other cervical disc displacement at C6-C7 level: Secondary | ICD-10-CM | POA: Diagnosis not present

## 2019-07-09 DIAGNOSIS — R739 Hyperglycemia, unspecified: Secondary | ICD-10-CM | POA: Diagnosis not present

## 2019-07-09 DIAGNOSIS — M545 Low back pain: Secondary | ICD-10-CM | POA: Diagnosis not present

## 2019-07-09 DIAGNOSIS — M9903 Segmental and somatic dysfunction of lumbar region: Secondary | ICD-10-CM | POA: Diagnosis not present

## 2019-07-09 DIAGNOSIS — Z23 Encounter for immunization: Secondary | ICD-10-CM

## 2019-07-09 DIAGNOSIS — G4700 Insomnia, unspecified: Secondary | ICD-10-CM | POA: Diagnosis not present

## 2019-07-09 DIAGNOSIS — M9902 Segmental and somatic dysfunction of thoracic region: Secondary | ICD-10-CM | POA: Diagnosis not present

## 2019-07-09 DIAGNOSIS — R293 Abnormal posture: Secondary | ICD-10-CM | POA: Diagnosis not present

## 2019-07-09 DIAGNOSIS — M5136 Other intervertebral disc degeneration, lumbar region: Secondary | ICD-10-CM | POA: Diagnosis not present

## 2019-07-09 MED ORDER — CLOTRIMAZOLE-BETAMETHASONE 1-0.05 % EX CREA: 1.0000 "application " | TOPICAL_CREAM | Freq: Four times a day (QID) | CUTANEOUS | 0 refills | Status: DC

## 2019-07-09 NOTE — Assessment & Plan Note (Signed)
Labs

## 2019-07-09 NOTE — Assessment & Plan Note (Signed)
Reduce Donepezil to 5 mg/day due to n/d

## 2019-07-09 NOTE — Progress Notes (Signed)
Subjective:  Patient ID: [name], male    DOB: [birth year]  Age: 85 y.o. MRN: [id number]  CC: No chief complaint on file.   HPI [name] presents for memory loss, FTT, wt loss He had some gas, diarrhea x 10-14 d F/u memory loss, sleepiness Taking naps a lot  Outpatient Medications Prior to Visit  Medication Sig Dispense Refill  . acetaminophen (TYLENOL) 500 MG tablet Take 1,000 mg by mouth every 6 (six) hours as needed.    [name] aspirin EC 81 MG tablet Take 81 mg by mouth daily.    [name] ELIQUIS 5 MG TABS tablet TAKE ONE TABLET TWICE DAILY 180 tablet 1  . escitalopram (LEXAPRO) 20 MG tablet Take 1 tablet (20 mg total) by mouth daily. 90 tablet 3  . Evolocumab (REPATHA SURECLICK) [id number] MG/ML SOAJ Inject 1 pen into the skin every 14 (fourteen) days. 2 pen 11  . ezetimibe (ZETIA) 10 MG tablet TAKE ONE-HALF TABLET DAILY 45 tablet 3  . fexofenadine (ALLEGRA) 180 MG tablet Take 180 mg by mouth daily as needed for allergies or rhinitis.     [name] lisinopril (ZESTRIL) 5 MG tablet Take 0.5 tablets (2.5 mg total) by mouth daily. 90 tablet 3  . LORazepam (ATIVAN) 0.5 MG tablet TAKE ONE OR TWO TABLETS TWICE DAILY AS NEEDED FOR ANXIETY 60 tablet 2  . memantine (NAMENDA) 10 MG tablet Take 1 tablet (10 mg total) by mouth 2 (two) times daily. 60 tablet 11  . metoprolol tartrate (LOPRESSOR) 25 MG tablet Take 1 tablet (25 mg total) by mouth 2 (two) times daily. (Patient taking differently: Take 25 mg by mouth daily. ) 180 tablet 2  . nitroGLYCERIN (NITROSTAT) 0.4 MG SL tablet ONE TABLET UNDER TONGUE AS NEEDED FOR CHEST PAIN. MAY REPEAT IN 5 MINUTES AND AGAIN IN 5 MINUTES (TOTAL OF 3 TABLETS) 25 tablet 6  . omeprazole (PRILOSEC) 40 MG capsule TAKE ONE CAPSULE EACH DAY 90 capsule 3  . ondansetron (ZOFRAN) 4 MG tablet Take 1 tablet (4 mg total) by mouth every 8 (eight) hours as needed for nausea or vomiting. 20 tablet 0  . oxyCODONE-acetaminophen (PERCOCET/ROXICET) 5-325 MG tablet TAKE ONE TABLET EVERY EIGHT  HOURS AS NEEDED FOR SEVERE PAIN 90 tablet 0  . Propylene Glycol (SYSTANE BALANCE OP) Place 2 drops into both eyes daily as needed (dry eyes).     . triamcinolone (KENALOG) 0.1 % paste Use as directed 1 application in the mouth or throat 2 (two) times daily. 5 g 12  . zolpidem (AMBIEN) 10 MG tablet TAKE 1/2 TO 1 TABLET AT BEDTIME AS NEEDED FOR SLEEP 30 tablet 3   No facility-administered medications prior to visit.     ROS: Review of Systems  Constitutional: Negative for appetite change, fatigue and unexpected weight change.  HENT: Negative for congestion, nosebleeds, sneezing, sore throat and trouble swallowing.   Eyes: Negative for itching and visual disturbance.  Respiratory: Negative for cough.   Cardiovascular: Negative for chest pain, palpitations and leg swelling.  Gastrointestinal: Positive for diarrhea and nausea. Negative for abdominal distention and blood in stool.  Genitourinary: Negative for frequency and hematuria.  Musculoskeletal: Negative for back pain, gait problem, joint swelling and neck pain.  Skin: Negative for rash.  Neurological: Negative for dizziness, tremors, speech difficulty and weakness.  Psychiatric/Behavioral: Positive for behavioral problems, confusion, decreased concentration and dysphoric mood. Negative for agitation, sleep disturbance and suicidal ideas. The patient is nervous/anxious.     Objective:  Ht 5\' 11"  (  1.803 m)   BMI 25.66 kg/m   BP Readings from Last 3 Encounters:  06/18/19 (!) 150/90  06/05/19 (!) 160/90  06/02/19 (!) 163/91    Wt Readings from Last 3 Encounters:  06/26/19 184 lb (83.5 kg)  06/18/19 182 lb (82.6 kg)  06/05/19 184 lb (83.5 kg)    Physical Exam Constitutional:      General: He is not in acute distress.    Appearance: He is well-developed.     Comments: NAD  Eyes:     Conjunctiva/sclera: Conjunctivae normal.     Pupils: Pupils are equal, round, and reactive to light.  Neck:     Musculoskeletal: Normal range  of motion.     Thyroid: No thyromegaly.     Vascular: No JVD.  Cardiovascular:     Rate and Rhythm: Normal rate and regular rhythm.     Heart sounds: Normal heart sounds. No murmur. No friction rub. No gallop.   Pulmonary:     Effort: Pulmonary effort is normal. No respiratory distress.     Breath sounds: Normal breath sounds. No wheezing or rales.  Chest:     Chest wall: No tenderness.  Abdominal:     General: Bowel sounds are normal. There is no distension.     Palpations: Abdomen is soft. There is no mass.     Tenderness: There is no abdominal tenderness. There is no guarding or rebound.  Musculoskeletal: Normal range of motion.        General: No tenderness.  Lymphadenopathy:     Cervical: No cervical adenopathy.  Skin:    General: Skin is warm and dry.     Findings: No rash.  Neurological:     Mental Status: He is alert and oriented to person, place, and time.     Cranial Nerves: No cranial nerve deficit.     Motor: No abnormal muscle tone.     Coordination: Coordination normal.     Gait: Gait normal.     Deep Tendon Reflexes: Reflexes are normal and symmetric.  Psychiatric:        Behavior: Behavior normal.   dysphoric Rash on R mouth corner  Lab Results  Component Value Date   WBC 7.2 04/13/2019   HGB 13.4 04/13/2019   HCT 40.1 04/13/2019   PLT 270 04/13/2019   GLUCOSE 131 (H) 04/13/2019   CHOL 190 12/29/2018   TRIG 239 (H) 12/29/2018   HDL 44 12/29/2018   LDLDIRECT 168.9 05/24/2013   LDLCALC 98 12/29/2018   ALT 14 04/13/2019   AST 18 04/13/2019   NA 141 04/13/2019   K 3.1 (L) 04/13/2019   CL 106 04/13/2019   CREATININE 0.85 04/13/2019   BUN 26 (H) 04/13/2019   CO2 22 04/13/2019   TSH 1.18 12/13/2017   PSA 2.46 02/21/2017   INR 0.92 09/13/2016   HGBA1C 6.4 08/30/2017    Mr Cervical Spine Wo Contrast  Result Date: 07/04/2019 CLINICAL DATA:  Neck pain with right arm radiculopathy EXAM: MRI CERVICAL SPINE WITHOUT CONTRAST TECHNIQUE: Multiplanar,  multisequence MR imaging of the cervical spine was performed. No intravenous contrast was administered. COMPARISON:  05/02/2017 FINDINGS: Alignment: Reversal of the normal cervical lordosis. Minimal anterolisthesis C3 on C4. Vertebrae: Interval C4-C6 ACDF with partial bony fusion at C4-5. No definite fusion is appreciated at the C5-6 level. No evidence of acute fracture, discitis, or marrow replacing lesion. Cord: Normal signal and morphology. Posterior Fossa, vertebral arteries, paraspinal tissues: Negative. Disc levels: C2-C3: Advanced bilateral facet and  uncovertebral arthropathy result in mild-to-moderate bilateral foraminal stenosis without canal stenosis. Findings are similar to prior. C3-C4: Posterior disc osteophyte complex with left greater than right uncovertebral spurring and bilateral facet arthropathy results in moderate left and minimal right foraminal stenosis. Disc osteophyte complex results in slight impress upon the ventral cord, similar to prior. C4-C5: Interval ACDF.  No residual foraminal or canal stenosis. C5-C6: Interval ACDF.  No residual foraminal or canal stenosis. C6-C7: Small posterior disc osteophyte complex with right greater than left uncovertebral spurring and mild bilateral facet arthrosis results in mild bilateral foraminal narrowing, not significantly progressed from prior. No canal stenosis. C7-T1: Right paracentral disc osteophyte complex and bilateral uncovertebral spurring rib salts in mild right-greater-than-left foraminal narrowing without canal stenosis. Findings are similar to prior. IMPRESSION: 1. Interval C4-C6 ACDF with no residual foraminal or canal narrowing at the fused levels. 2. Similar degenerative changes at the unfused levels compared to prior MRI 05/18/17, which are most pronounced at C3-4. Electronically Signed   By: Davina Poke M.D.   On: 07/04/2019 13:38    Assessment & Plan:   There are no diagnoses linked to this encounter.   No orders of the  defined types were placed in this encounter.    Follow-up: No follow-ups on file.  [name], MD

## 2019-07-09 NOTE — Assessment & Plan Note (Signed)
Lotrisone 

## 2019-07-09 NOTE — Assessment & Plan Note (Addendum)
Zetia, ASA, Toprol, Crestor No CP

## 2019-07-09 NOTE — Assessment & Plan Note (Signed)
Eliquis 

## 2019-07-09 NOTE — Assessment & Plan Note (Signed)
MRI reviewed Better

## 2019-07-09 NOTE — Patient Instructions (Signed)
Reduce Donepezil to 5 mg/day

## 2019-07-09 NOTE — Assessment & Plan Note (Addendum)
Lexapro - 8/20 restart (stopped for ?reason)

## 2019-07-09 NOTE — Assessment & Plan Note (Signed)
Reduce naps

## 2019-07-13 DIAGNOSIS — M50223 Other cervical disc displacement at C6-C7 level: Secondary | ICD-10-CM | POA: Diagnosis not present

## 2019-07-13 DIAGNOSIS — R293 Abnormal posture: Secondary | ICD-10-CM | POA: Diagnosis not present

## 2019-07-13 DIAGNOSIS — M9903 Segmental and somatic dysfunction of lumbar region: Secondary | ICD-10-CM | POA: Diagnosis not present

## 2019-07-13 DIAGNOSIS — M6240 Contracture of muscle, unspecified site: Secondary | ICD-10-CM | POA: Diagnosis not present

## 2019-07-13 DIAGNOSIS — M5136 Other intervertebral disc degeneration, lumbar region: Secondary | ICD-10-CM | POA: Diagnosis not present

## 2019-07-13 DIAGNOSIS — M9901 Segmental and somatic dysfunction of cervical region: Secondary | ICD-10-CM | POA: Diagnosis not present

## 2019-07-13 DIAGNOSIS — M542 Cervicalgia: Secondary | ICD-10-CM | POA: Diagnosis not present

## 2019-07-13 DIAGNOSIS — M9902 Segmental and somatic dysfunction of thoracic region: Secondary | ICD-10-CM | POA: Diagnosis not present

## 2019-07-13 DIAGNOSIS — M545 Low back pain: Secondary | ICD-10-CM | POA: Diagnosis not present

## 2019-07-16 DIAGNOSIS — M9902 Segmental and somatic dysfunction of thoracic region: Secondary | ICD-10-CM | POA: Diagnosis not present

## 2019-07-16 DIAGNOSIS — R293 Abnormal posture: Secondary | ICD-10-CM | POA: Diagnosis not present

## 2019-07-16 DIAGNOSIS — M545 Low back pain: Secondary | ICD-10-CM | POA: Diagnosis not present

## 2019-07-16 DIAGNOSIS — M5136 Other intervertebral disc degeneration, lumbar region: Secondary | ICD-10-CM | POA: Diagnosis not present

## 2019-07-16 DIAGNOSIS — M542 Cervicalgia: Secondary | ICD-10-CM | POA: Diagnosis not present

## 2019-07-16 DIAGNOSIS — M50223 Other cervical disc displacement at C6-C7 level: Secondary | ICD-10-CM | POA: Diagnosis not present

## 2019-07-16 DIAGNOSIS — M0609 Rheumatoid arthritis without rheumatoid factor, multiple sites: Secondary | ICD-10-CM | POA: Diagnosis not present

## 2019-07-16 DIAGNOSIS — M6240 Contracture of muscle, unspecified site: Secondary | ICD-10-CM | POA: Diagnosis not present

## 2019-07-16 DIAGNOSIS — M9903 Segmental and somatic dysfunction of lumbar region: Secondary | ICD-10-CM | POA: Diagnosis not present

## 2019-07-16 DIAGNOSIS — M9901 Segmental and somatic dysfunction of cervical region: Secondary | ICD-10-CM | POA: Diagnosis not present

## 2019-07-20 DIAGNOSIS — M9903 Segmental and somatic dysfunction of lumbar region: Secondary | ICD-10-CM | POA: Diagnosis not present

## 2019-07-20 DIAGNOSIS — M9901 Segmental and somatic dysfunction of cervical region: Secondary | ICD-10-CM | POA: Diagnosis not present

## 2019-07-20 DIAGNOSIS — M9902 Segmental and somatic dysfunction of thoracic region: Secondary | ICD-10-CM | POA: Diagnosis not present

## 2019-07-20 DIAGNOSIS — M545 Low back pain: Secondary | ICD-10-CM | POA: Diagnosis not present

## 2019-07-20 DIAGNOSIS — M6240 Contracture of muscle, unspecified site: Secondary | ICD-10-CM | POA: Diagnosis not present

## 2019-07-20 DIAGNOSIS — M50223 Other cervical disc displacement at C6-C7 level: Secondary | ICD-10-CM | POA: Diagnosis not present

## 2019-07-20 DIAGNOSIS — M542 Cervicalgia: Secondary | ICD-10-CM | POA: Diagnosis not present

## 2019-07-25 DIAGNOSIS — M9901 Segmental and somatic dysfunction of cervical region: Secondary | ICD-10-CM | POA: Diagnosis not present

## 2019-07-25 DIAGNOSIS — M545 Low back pain: Secondary | ICD-10-CM | POA: Diagnosis not present

## 2019-07-25 DIAGNOSIS — M9903 Segmental and somatic dysfunction of lumbar region: Secondary | ICD-10-CM | POA: Diagnosis not present

## 2019-07-25 DIAGNOSIS — M542 Cervicalgia: Secondary | ICD-10-CM | POA: Diagnosis not present

## 2019-07-25 DIAGNOSIS — M9902 Segmental and somatic dysfunction of thoracic region: Secondary | ICD-10-CM | POA: Diagnosis not present

## 2019-07-25 DIAGNOSIS — M50223 Other cervical disc displacement at C6-C7 level: Secondary | ICD-10-CM | POA: Diagnosis not present

## 2019-07-25 DIAGNOSIS — M6240 Contracture of muscle, unspecified site: Secondary | ICD-10-CM | POA: Diagnosis not present

## 2019-08-01 DIAGNOSIS — M50223 Other cervical disc displacement at C6-C7 level: Secondary | ICD-10-CM | POA: Diagnosis not present

## 2019-08-01 DIAGNOSIS — M545 Low back pain: Secondary | ICD-10-CM | POA: Diagnosis not present

## 2019-08-01 DIAGNOSIS — M542 Cervicalgia: Secondary | ICD-10-CM | POA: Diagnosis not present

## 2019-08-01 DIAGNOSIS — M9902 Segmental and somatic dysfunction of thoracic region: Secondary | ICD-10-CM | POA: Diagnosis not present

## 2019-08-01 DIAGNOSIS — M6240 Contracture of muscle, unspecified site: Secondary | ICD-10-CM | POA: Diagnosis not present

## 2019-08-01 DIAGNOSIS — M9903 Segmental and somatic dysfunction of lumbar region: Secondary | ICD-10-CM | POA: Diagnosis not present

## 2019-08-01 DIAGNOSIS — M9901 Segmental and somatic dysfunction of cervical region: Secondary | ICD-10-CM | POA: Diagnosis not present

## 2019-08-03 ENCOUNTER — Other Ambulatory Visit: Payer: Self-pay | Admitting: Internal Medicine

## 2019-08-03 DIAGNOSIS — M50223 Other cervical disc displacement at C6-C7 level: Secondary | ICD-10-CM | POA: Diagnosis not present

## 2019-08-03 DIAGNOSIS — M9903 Segmental and somatic dysfunction of lumbar region: Secondary | ICD-10-CM | POA: Diagnosis not present

## 2019-08-03 DIAGNOSIS — M9901 Segmental and somatic dysfunction of cervical region: Secondary | ICD-10-CM | POA: Diagnosis not present

## 2019-08-03 DIAGNOSIS — M545 Low back pain: Secondary | ICD-10-CM | POA: Diagnosis not present

## 2019-08-03 DIAGNOSIS — M542 Cervicalgia: Secondary | ICD-10-CM | POA: Diagnosis not present

## 2019-08-03 DIAGNOSIS — M6240 Contracture of muscle, unspecified site: Secondary | ICD-10-CM | POA: Diagnosis not present

## 2019-08-03 DIAGNOSIS — M9902 Segmental and somatic dysfunction of thoracic region: Secondary | ICD-10-CM | POA: Diagnosis not present

## 2019-08-06 DIAGNOSIS — M545 Low back pain: Secondary | ICD-10-CM | POA: Diagnosis not present

## 2019-08-06 DIAGNOSIS — M9903 Segmental and somatic dysfunction of lumbar region: Secondary | ICD-10-CM | POA: Diagnosis not present

## 2019-08-06 DIAGNOSIS — M9902 Segmental and somatic dysfunction of thoracic region: Secondary | ICD-10-CM | POA: Diagnosis not present

## 2019-08-06 DIAGNOSIS — M50223 Other cervical disc displacement at C6-C7 level: Secondary | ICD-10-CM | POA: Diagnosis not present

## 2019-08-06 DIAGNOSIS — M9901 Segmental and somatic dysfunction of cervical region: Secondary | ICD-10-CM | POA: Diagnosis not present

## 2019-08-06 DIAGNOSIS — M6240 Contracture of muscle, unspecified site: Secondary | ICD-10-CM | POA: Diagnosis not present

## 2019-08-06 DIAGNOSIS — M542 Cervicalgia: Secondary | ICD-10-CM | POA: Diagnosis not present

## 2019-08-07 NOTE — Telephone Encounter (Signed)
Please advise about refill

## 2019-08-10 DIAGNOSIS — M542 Cervicalgia: Secondary | ICD-10-CM | POA: Diagnosis not present

## 2019-08-10 DIAGNOSIS — M9903 Segmental and somatic dysfunction of lumbar region: Secondary | ICD-10-CM | POA: Diagnosis not present

## 2019-08-10 DIAGNOSIS — M9902 Segmental and somatic dysfunction of thoracic region: Secondary | ICD-10-CM | POA: Diagnosis not present

## 2019-08-10 DIAGNOSIS — M9901 Segmental and somatic dysfunction of cervical region: Secondary | ICD-10-CM | POA: Diagnosis not present

## 2019-08-10 DIAGNOSIS — M50223 Other cervical disc displacement at C6-C7 level: Secondary | ICD-10-CM | POA: Diagnosis not present

## 2019-08-10 DIAGNOSIS — M6240 Contracture of muscle, unspecified site: Secondary | ICD-10-CM | POA: Diagnosis not present

## 2019-08-10 DIAGNOSIS — M545 Low back pain: Secondary | ICD-10-CM | POA: Diagnosis not present

## 2019-08-13 DIAGNOSIS — M9903 Segmental and somatic dysfunction of lumbar region: Secondary | ICD-10-CM | POA: Diagnosis not present

## 2019-08-13 DIAGNOSIS — M9901 Segmental and somatic dysfunction of cervical region: Secondary | ICD-10-CM | POA: Diagnosis not present

## 2019-08-13 DIAGNOSIS — M6240 Contracture of muscle, unspecified site: Secondary | ICD-10-CM | POA: Diagnosis not present

## 2019-08-13 DIAGNOSIS — M0609 Rheumatoid arthritis without rheumatoid factor, multiple sites: Secondary | ICD-10-CM | POA: Diagnosis not present

## 2019-08-13 DIAGNOSIS — M50223 Other cervical disc displacement at C6-C7 level: Secondary | ICD-10-CM | POA: Diagnosis not present

## 2019-08-13 DIAGNOSIS — M542 Cervicalgia: Secondary | ICD-10-CM | POA: Diagnosis not present

## 2019-08-13 DIAGNOSIS — M9902 Segmental and somatic dysfunction of thoracic region: Secondary | ICD-10-CM | POA: Diagnosis not present

## 2019-08-13 DIAGNOSIS — M545 Low back pain: Secondary | ICD-10-CM | POA: Diagnosis not present

## 2019-08-15 DIAGNOSIS — M545 Low back pain: Secondary | ICD-10-CM | POA: Diagnosis not present

## 2019-08-15 DIAGNOSIS — M50223 Other cervical disc displacement at C6-C7 level: Secondary | ICD-10-CM | POA: Diagnosis not present

## 2019-08-15 DIAGNOSIS — M9901 Segmental and somatic dysfunction of cervical region: Secondary | ICD-10-CM | POA: Diagnosis not present

## 2019-08-15 DIAGNOSIS — M9902 Segmental and somatic dysfunction of thoracic region: Secondary | ICD-10-CM | POA: Diagnosis not present

## 2019-08-15 DIAGNOSIS — M542 Cervicalgia: Secondary | ICD-10-CM | POA: Diagnosis not present

## 2019-08-15 DIAGNOSIS — M9903 Segmental and somatic dysfunction of lumbar region: Secondary | ICD-10-CM | POA: Diagnosis not present

## 2019-08-15 DIAGNOSIS — M6240 Contracture of muscle, unspecified site: Secondary | ICD-10-CM | POA: Diagnosis not present

## 2019-08-27 DIAGNOSIS — M9903 Segmental and somatic dysfunction of lumbar region: Secondary | ICD-10-CM | POA: Diagnosis not present

## 2019-08-27 DIAGNOSIS — M6240 Contracture of muscle, unspecified site: Secondary | ICD-10-CM | POA: Diagnosis not present

## 2019-08-27 DIAGNOSIS — M9901 Segmental and somatic dysfunction of cervical region: Secondary | ICD-10-CM | POA: Diagnosis not present

## 2019-08-27 DIAGNOSIS — M545 Low back pain: Secondary | ICD-10-CM | POA: Diagnosis not present

## 2019-08-27 DIAGNOSIS — M50223 Other cervical disc displacement at C6-C7 level: Secondary | ICD-10-CM | POA: Diagnosis not present

## 2019-08-27 DIAGNOSIS — M542 Cervicalgia: Secondary | ICD-10-CM | POA: Diagnosis not present

## 2019-08-27 DIAGNOSIS — M9902 Segmental and somatic dysfunction of thoracic region: Secondary | ICD-10-CM | POA: Diagnosis not present

## 2019-09-07 ENCOUNTER — Other Ambulatory Visit: Payer: Self-pay

## 2019-09-07 ENCOUNTER — Encounter: Payer: Self-pay | Admitting: Internal Medicine

## 2019-09-07 ENCOUNTER — Ambulatory Visit (INDEPENDENT_AMBULATORY_CARE_PROVIDER_SITE_OTHER): Payer: Medicare Other | Admitting: Internal Medicine

## 2019-09-07 VITALS — BP 110/70 | HR 72 | Temp 98.0°F | Ht 71.0 in | Wt 183.0 lb

## 2019-09-07 DIAGNOSIS — I2510 Atherosclerotic heart disease of native coronary artery without angina pectoris: Secondary | ICD-10-CM | POA: Diagnosis not present

## 2019-09-07 DIAGNOSIS — M545 Low back pain, unspecified: Secondary | ICD-10-CM

## 2019-09-07 MED ORDER — METHYLPREDNISOLONE ACETATE 40 MG/ML IJ SUSP
40.0000 mg | Freq: Once | INTRAMUSCULAR | Status: AC
  Administered 2019-09-07: 40 mg via INTRAMUSCULAR

## 2019-09-07 NOTE — Patient Instructions (Signed)
We have given you the steroid shot today which will likely help you feel better quicker.   It is safe to take tylenol (acetaminophen) for pain. Do not take ibuprofen, aleve, motrin, naproxen as these can cause stomach bleeding with the eliquis.  This could take 2-3 weeks to get better.

## 2019-09-07 NOTE — Progress Notes (Signed)
   Subjective:   Patient ID: [name], male    DOB: July 24, 1933, 85 y.o.   MRN: [id number]  HPI The patient is an 85 YO man coming in for right lower back pain. Started Monday with fall in the bedroom and hit right side on the furniture at the bottom of the bed. 6/10 pain. Having no SOB or dizziness. Denies LOC or head injury. Denies blood in urine. Denies pain with breathing. Pain was worst Tue/Wednesday and seems to be improving mildly now. Overall it is improving slightly. Has tried tylenol and oxycodone. Takes eliquis and cannot take nsaids for this pain.   Review of Systems  Constitutional: Negative.   HENT: Negative.   Eyes: Negative.   Respiratory: Negative for cough, chest tightness and shortness of breath.   Cardiovascular: Negative for chest pain, palpitations and leg swelling.  Gastrointestinal: Negative for abdominal distention, abdominal pain, constipation, diarrhea, nausea and vomiting.  Musculoskeletal: Positive for myalgias.  Skin: Negative.   Neurological: Negative.   Psychiatric/Behavioral: Negative.     Objective:  Physical Exam Constitutional:      Appearance: He is well-developed.  HENT:     Head: Normocephalic and atraumatic.  Neck:     Musculoskeletal: Normal range of motion.  Cardiovascular:     Rate and Rhythm: Normal rate.  Pulmonary:     Effort: Pulmonary effort is normal. No respiratory distress.     Breath sounds: Normal breath sounds. No wheezing or rales.  Abdominal:     General: Bowel sounds are normal. There is no distension.     Palpations: Abdomen is soft.     Tenderness: There is no abdominal tenderness. There is no rebound.  Musculoskeletal:        General: Tenderness present.     Comments: Right flank tenderness at the bottom of the rib cage, no deformity felt  Skin:    General: Skin is warm and dry.  Neurological:     Mental Status: He is alert and oriented to person, place, and time.     Coordination: Coordination normal.      Vitals:   09/07/19 1037  BP: 110/70  Pulse: 72  Temp: 98 F (36.7 C)  TempSrc: Oral  SpO2: 98%  Weight: 183 lb (83 kg)  Height: 5\' 11"  (1.803 m)    Assessment & Plan:  Depo-medrol 40 mg IM

## 2019-09-07 NOTE — Assessment & Plan Note (Signed)
Given depo-medrol 40 mg IM and advised to take tylenol if needed for pain. Rare oxycodone which is prescribed by PCP.

## 2019-09-10 DIAGNOSIS — M0609 Rheumatoid arthritis without rheumatoid factor, multiple sites: Secondary | ICD-10-CM | POA: Diagnosis not present

## 2019-09-25 DIAGNOSIS — M5030 Other cervical disc degeneration, unspecified cervical region: Secondary | ICD-10-CM | POA: Diagnosis not present

## 2019-09-25 DIAGNOSIS — E663 Overweight: Secondary | ICD-10-CM | POA: Diagnosis not present

## 2019-09-25 DIAGNOSIS — Z6825 Body mass index (BMI) 25.0-25.9, adult: Secondary | ICD-10-CM | POA: Diagnosis not present

## 2019-09-25 DIAGNOSIS — M0609 Rheumatoid arthritis without rheumatoid factor, multiple sites: Secondary | ICD-10-CM | POA: Diagnosis not present

## 2019-10-08 DIAGNOSIS — M0609 Rheumatoid arthritis without rheumatoid factor, multiple sites: Secondary | ICD-10-CM | POA: Diagnosis not present

## 2019-10-18 DIAGNOSIS — Z85828 Personal history of other malignant neoplasm of skin: Secondary | ICD-10-CM | POA: Diagnosis not present

## 2019-10-18 DIAGNOSIS — L570 Actinic keratosis: Secondary | ICD-10-CM | POA: Diagnosis not present

## 2019-10-18 DIAGNOSIS — D485 Neoplasm of uncertain behavior of skin: Secondary | ICD-10-CM | POA: Diagnosis not present

## 2019-10-25 ENCOUNTER — Telehealth: Payer: Self-pay | Admitting: *Deleted

## 2019-10-25 NOTE — Telephone Encounter (Signed)

## 2019-10-30 ENCOUNTER — Telehealth: Payer: Self-pay | Admitting: Cardiology

## 2019-10-30 ENCOUNTER — Other Ambulatory Visit: Payer: Self-pay

## 2019-10-30 ENCOUNTER — Encounter: Payer: Self-pay | Admitting: Cardiology

## 2019-10-30 ENCOUNTER — Telehealth (INDEPENDENT_AMBULATORY_CARE_PROVIDER_SITE_OTHER): Payer: Medicare Other | Admitting: Cardiology

## 2019-10-30 VITALS — BP 153/76 | HR 64 | Ht 71.0 in | Wt 184.0 lb

## 2019-10-30 DIAGNOSIS — I10 Essential (primary) hypertension: Secondary | ICD-10-CM

## 2019-10-30 DIAGNOSIS — I480 Paroxysmal atrial fibrillation: Secondary | ICD-10-CM

## 2019-10-30 DIAGNOSIS — G4733 Obstructive sleep apnea (adult) (pediatric): Secondary | ICD-10-CM

## 2019-10-30 DIAGNOSIS — I2510 Atherosclerotic heart disease of native coronary artery without angina pectoris: Secondary | ICD-10-CM

## 2019-10-30 DIAGNOSIS — E785 Hyperlipidemia, unspecified: Secondary | ICD-10-CM

## 2019-10-30 MED ORDER — LISINOPRIL 10 MG PO TABS: 10.0000 mg | ORAL_TABLET | Freq: Every day | ORAL | 3 refills | Status: DC

## 2019-10-30 NOTE — Telephone Encounter (Signed)
New Message ° ° ° °Pt is returning a call  ° ° °Please call back  °

## 2019-10-30 NOTE — Progress Notes (Signed)
Virtual Visit via Telephone  Note   This visit type was conducted due to national recommendations for restrictions regarding the COVID-19 Pandemic (e.g. social distancing) in an effort to limit this patient's exposure and mitigate transmission in our community.  Due to his co-morbid illnesses, this patient is at least at moderate risk for complications without adequate follow up.  This format is felt to be most appropriate for this patient at this time.  All issues noted in this document were discussed and addressed.  A limited physical exam was performed with this format.  Please refer to the patient's chart for his consent to telehealth for [facility].   Evaluation Performed:  Follow-up visit  This visit type was conducted due to national recommendations for restrictions regarding the COVID-19 Pandemic (e.g. social distancing).  This format is felt to be most appropriate for this patient at this time.  All issues noted in this document were discussed and addressed.  No physical exam was performed (except for noted visual exam findings with Video Visits).  Please refer to the patient's chart (MyChart message for video visits and phone note for telephone visits) for the patient's consent to telehealth for [facility].  Date:  10/30/2019   ID:  [name], DOB [birth year], MRN [id number]  Patient Location:  Home  Provider location:   [place]  PCP:  Plotnikov, Evie Lacks, MD  Cardiologist:  Fransico Him, MD  Electrophysiologist:  Thompson Grayer, MD   Chief Complaint:  OSA, HTN, CAD, HLD  History of Present Illness:    [name] is a 85 y.o. male who presents via audio/video conferencing for a telehealth visit today.    [name] is an 85 y.o. male with a hx of CAD (inferior STEMI 12/2013 with total RCA occlusion s/p DESx2, residual 60-70% mLAD), paroxysmal atrial fib (dx 02/2015), PACs, OSA on CPAP, GERD, HTN, HLD(prior intolerances to other statins).  He has moderate OSA  with an AHI of 18/hr. He is now on 8cm H2O.   He is here today for followup and is doing well.  He denies any chest pain or pressure, SOB, DOE, PND, orthopnea, LE edema, dizziness, palpitations or syncope. He is compliant with his meds and is tolerating meds with no SE.  He walks his dog every afternoon without any problems.   He had been doing well with his CPAP at our last OV but he says that he got lazy and stopped using it.  He will try to restart it as it has been sitting by his bedside.  The patient does not have symptoms concerning for COVID-19 infection (fever, chills, cough, or new shortness of breath).   Prior CV studies:   The following studies were reviewed today:  PAP compliance download  Past Medical History:  Diagnosis Date   CAD (coronary artery disease)    a. s/p inferior STEMI 01/08/2014; LHC 01/08/14: total RCA occlusion s/p 3.5x11mm Xience DES distal RCA and 3.5x28 mm DES mid RCA, 60-70% mid LAD stenosis, EF 55%.   Complication of anesthesia    'long to wake up after back surgery " 09/2003   Diverticulosis of colon    GERD (gastroesophageal reflux disease)    History of cellulitis    05-26-2015  LLE   History of colon polyps    1998- benign/  2008 adenomatous    History of kidney stones    2013   History of squamous cell carcinoma excision    2013;  2015;  06-12-2015 right leg/  02/ and 05/ 2017  left ear and left leg   Hydronephrosis, left    Hyperlipidemia    Hypertension    Migraine with aura    OSA on CPAP 06/19/2015   Moderate OSA with AHI 18/hr  per study 05-20-2015   Osteoarthritis    Paroxysmal atrial fibrillation ([place]) 4/16   chads2vasc score is at least 4   Premature atrial contractions    RA (rheumatoid arthritis) [facility])    rheumatologist-  dr Leigh Aurora   Sinusitis, chronic 10/17/2015   Wears glasses    Wears hearing aid    bilateral   Past Surgical History:  Procedure Laterality Date   BACK SURGERY      CARDIOVASCULAR STRESS TEST  11/21/2015   Low risk nuclear study w/ a small diaphragmatic attenuation artifact, no ischemia/  normal LV function and wall motion , ef 63%   CATARACT EXTRACTION W/ INTRAOCULAR LENS  IMPLANT, BILATERAL  2015   COLONOSCOPY  last one 06-09-2011   CORONARY ANGIOPLASTY     CYSTOSCOPY W/ RETROGRADES Left 07/12/2016   Procedure: CYSTOSCOPY WITH RETROGRADE PYELOGRAM LEFT URETERAL STENT;  Surgeon: Irine Seal, MD;  Location: [facility];  Service: Urology;  Laterality: Left;   CYSTOSCOPY WITH RETROGRADE PYELOGRAM, URETEROSCOPY AND STENT PLACEMENT Left 08/11/2016   Procedure: CYSTOSCOPY WITH RETROGRADE PYELOGRAM,  DIAGNOSTIC URETEROSCOPY , STENT EXCHANGE;  Surgeon: Alexis Frock, MD;  [facility];  Service: Urology;  Laterality: Left;   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   severe fibromatosis palm and fingers   LEFT HEART CATHETERIZATION WITH CORONARY ANGIOGRAM N/A 01/08/2014   Procedure: LEFT HEART CATHETERIZATION WITH CORONARY ANGIOGRAM;  Surgeon: Troy Sine, MD;  Location: [facility] CATH LAB;  Service: Cardiovascular;  Laterality:N/A;  total/ subtotal RCA/  mLAD [id number] w/ mid systolic bridging/  preserved global LVF, ef 55%   MOHS SURGERY  x2  feb and may 2017   left ear /  left leg  ([facility])   PERCUTANEOUS CORONARY STENT INTERVENTION (PCI-S)  01/08/2014   Procedure: PERCUTANEOUS CORONARY STENT INTERVENTION (PCI-S);  Surgeon: Troy Sine, MD;  [facility];  Service: Cardiovascular;;  DES to mid and distal RCA   POSTERIOR LUMBAR FUSION  10/01/2003   and Laminectomy/ diskectomy  L4 -- S1   ROBOT ASSISTED PYELOPLASTY Left 09/15/2016   Procedure: XI ROBOTIC ASSISTED PYELOPLASTY;  Surgeon: Alexis Frock, MD;  Location: [facility];  Service: Urology;  Laterality: Left;   TONSILLECTOMY     TRANSTHORACIC ECHOCARDIOGRAM  02/23/2016   mild LVH,  ef 50-55%,  grade 1 diastolic dysfunction/  mild to moderate AV calcification w/ no stenosis  or regurg./  trivial MR and TR/  mild PR     Current Meds  Medication Sig   acetaminophen (TYLENOL) 500 MG tablet Take 1,000 mg by mouth every 6 (six) hours as needed.   aspirin EC 81 MG tablet Take 81 mg by mouth daily.   clotrimazole-betamethasone (LOTRISONE) cream Apply 1 application topically 4 (four) times daily. To irritated skin (Patient taking differently: Apply 1 application topically 4 (four) times daily as needed (irritated skin). To irritated skin)   ELIQUIS 5 MG TABS tablet TAKE ONE TABLET TWICE DAILY   escitalopram (LEXAPRO) 20 MG tablet Take 1 tablet (20 mg total) by mouth daily.   Evolocumab (REPATHA SURECLICK) [id number] MG/ML SOAJ Inject 1 pen into the skin every 14 (fourteen) days.   ezetimibe (ZETIA) 10 MG tablet TAKE ONE-HALF TABLET DAILY  fexofenadine (ALLEGRA) 180 MG tablet Take 180 mg by mouth daily as needed for allergies or rhinitis.    lisinopril (ZESTRIL) 5 MG tablet Take 0.5 tablets (2.5 mg total) by mouth daily.   LORazepam (ATIVAN) 0.5 MG tablet TAKE ONE OR TWO TABLETS TWICE DAILY AS NEEDED FOR ANXIETY   memantine (NAMENDA) 10 MG tablet Take 1 tablet (10 mg total) by mouth 2 (two) times daily.   metoprolol tartrate (LOPRESSOR) 25 MG tablet Take 1 tablet (25 mg total) by mouth 2 (two) times daily. (Patient taking differently: Take 25 mg by mouth daily. )   nitroGLYCERIN (NITROSTAT) 0.4 MG SL tablet ONE TABLET UNDER TONGUE AS NEEDED FOR CHEST PAIN. MAY REPEAT IN 5 MINUTES AND AGAIN IN 5 MINUTES (TOTAL OF 3 TABLETS)   omeprazole (PRILOSEC) 40 MG capsule TAKE ONE CAPSULE EACH DAY   ondansetron (ZOFRAN) 4 MG tablet Take 1 tablet (4 mg total) by mouth every 8 (eight) hours as needed for nausea or vomiting.   Propylene Glycol (SYSTANE BALANCE OP) Place 2 drops into both eyes daily as needed (dry eyes).    triamcinolone (KENALOG) 0.1 % paste Use as directed 1 application in the mouth or throat 2 (two) times daily.   zolpidem (AMBIEN) 10 MG tablet TAKE 1/2 TO 1  TABLET AT BEDTIME AS NEEDED FOR SLEEP     Allergies:   Methotrexate, Aricept [donepezil hcl], Crestor [rosuvastatin calcium], Lisinopril, Atorvastatin, and Pravastatin sodium   Social History   Tobacco Use   Smoking status: Former Smoker    Years: 10.00    Types: Cigarettes    Quit date: 08/09/1968    Years since quitting: 51.2   Smokeless tobacco: Never Used  Substance Use Topics   Alcohol use: Yes    Alcohol/week: 2.0 standard drinks    Types: 2 Shots of liquor per week    Comment: daily   Drug use: No     Family Hx: The patient's family history includes Heart disease in his father; Hypertension in his mother. There is no history of Colon cancer.  ROS:   Please see the history of present illness.     All other systems reviewed and are negative.   Labs/Other Tests and Data Reviewed:    Recent Labs: 04/13/2019: ALT 14; BUN 26; Creatinine, Ser 0.85; Hemoglobin 13.4; Platelets 270; Potassium 3.1; Sodium 141   Recent Lipid Panel Lab Results  Component Value Date/Time   CHOL 190 12/29/2018 08:27 AM   TRIG 239 (H) 12/29/2018 08:27 AM   HDL 44 12/29/2018 08:27 AM   CHOLHDL 4.3 12/29/2018 08:27 AM   CHOLHDL 3 02/21/2017 01:49 PM   LDLCALC 98 12/29/2018 08:27 AM   LDLDIRECT 168.9 05/24/2013 07:32 AM    Wt Readings from Last 3 Encounters:  10/30/19 184 lb (83.5 kg)  09/07/19 183 lb (83 kg)  07/09/19 182 lb (82.6 kg)     Objective:    Vital Signs:  BP (!) 153/76    Pulse 64    Ht 5\' 11"  (1.803 m)    Wt 184 lb (83.5 kg)    BMI 25.66 kg/m    ASSESSMENT & PLAN:    1.  OSA - he has not been using his device recently.  He says that he got lazy and will try to get back on it.    2.  HTN -BP borderline controlled -continue Lopressor 25mg  BID -increase Lisinopril to 10mg  daily -BMET in 1 week -check BP daily at lunch for a week and call  with results  3.  ASCAD -s/p inferior STEMI 12/2013 with total RCA occlusion s/p DESx2, residual 60-70% mLAD.  -he has not had  any anginal sx -continue ASA 81mg  daily, BB and statin  4.  PAF -he denies any palpitations -continue Apixaban 5mg  BID and Lopressor 25mg  BID (wt 83Kg and creatinine 0.91 and Hbg 14) -he has not had any bleeding problems on DOAC -his creatinine was normal at 0.91 in June -repeat BMET as K+ was 3.1 in may  5.  HLD -LDL goal < 70 -continue Crestor 10mg  daily -heck FLP and ALT  COVID-19 Education: The signs and symptoms of COVID-19 were discussed with the patient and how to seek care for testing (follow up with PCP or arrange E-visit).  The importance of social distancing was discussed today.  Patient Risk:   After full review of this patient's clinical status, I feel that they are at least moderate risk at this time.  Time:   Today, I have spent 20 minutes directly with the patient on telemedicine discussing medical problems including CAD, HTN, OSA< HLD, PAF.  We also reviewed the symptoms of COVID 19 and the ways to protect against contracting the virus with telehealth technology.  I spent an additional 5 minutes reviewing patient's chart including labs and PAP compliance download.  Medication Adjustments/Labs and Tests Ordered: Current medicines are reviewed at length with the patient today.  Concerns regarding medicines are outlined above.  Tests Ordered: No orders of the defined types were placed in this encounter.  Medication Changes: No orders of the defined types were placed in this encounter.   Disposition:  Follow up in 1 year(s)  Signed, Fransico Him, MD  10/30/2019 2:09 PM    [place]

## 2019-10-30 NOTE — Addendum Note (Signed)
Addended by: Antonieta Iba on: 10/30/2019 03:19 PM   Modules accepted: Orders

## 2019-10-30 NOTE — Patient Instructions (Addendum)
Medication Instructions:  Your physician has recommended you make the following change in your medication:   1) INCREASE lisinopril to 10 mg daily   *If you need a refill on your cardiac medications before your next appointment, please call your pharmacy*  Lab Work: You will need to come in to have a CMET and fasting lipids drawn on 11/06/19.   If you have labs (blood work) drawn today and your tests are completely normal, you will receive your results only by: [name] MyChart Message (if you have MyChart) OR . A paper copy in the mail If you have any lab test that is abnormal or we need to change your treatment, we will call you to review the results.  Follow-Up: At [facility], you and your health needs are our priority.  As part of our continuing mission to provide you with exceptional heart care, we have created designated Provider Care Teams.  These Care Teams include your primary Cardiologist (physician) and Advanced Practice Providers (APPs -  Physician Assistants and Nurse Practitioners) who all work together to provide you with the care you need, when you need it.  Your next appointment:   1 year(s)  The format for your next appointment:   Either In Person or Virtual  Provider:   Fransico Him, MD  Other Instructions Please check your blood pressure at lunch time daily for 1 week and sent [place] a message with your readings.

## 2019-11-06 ENCOUNTER — Other Ambulatory Visit: Payer: Self-pay

## 2019-11-06 ENCOUNTER — Other Ambulatory Visit: Payer: Medicare Other | Admitting: *Deleted

## 2019-11-06 DIAGNOSIS — I2510 Atherosclerotic heart disease of native coronary artery without angina pectoris: Secondary | ICD-10-CM

## 2019-11-06 DIAGNOSIS — E785 Hyperlipidemia, unspecified: Secondary | ICD-10-CM

## 2019-11-06 DIAGNOSIS — G4733 Obstructive sleep apnea (adult) (pediatric): Secondary | ICD-10-CM

## 2019-11-06 DIAGNOSIS — I10 Essential (primary) hypertension: Secondary | ICD-10-CM

## 2019-11-06 DIAGNOSIS — I480 Paroxysmal atrial fibrillation: Secondary | ICD-10-CM

## 2019-11-06 LAB — COMPREHENSIVE METABOLIC PANEL
ALT: 11 IU/L (ref 0–44)
AST: 20 IU/L (ref 0–40)
Albumin/Globulin Ratio: 1.6 (ref 1.2–2.2)
Albumin: 4.4 g/dL (ref 3.6–4.6)
Alkaline Phosphatase: 62 IU/L (ref 39–117)
BUN/Creatinine Ratio: 13 (ref 10–24)
BUN: 13 mg/dL (ref 8–27)
Bilirubin Total: 0.5 mg/dL (ref 0.0–1.2)
CO2: 24 mmol/L (ref 20–29)
Calcium: 9.2 mg/dL (ref 8.6–10.2)
Chloride: 104 mmol/L (ref 96–106)
Creatinine, Ser: 0.97 mg/dL (ref 0.76–1.27)
GFR calc Af Amer: 83 mL/min/{1.73_m2} (ref >59)
GFR calc non Af Amer: 71 mL/min/{1.73_m2} (ref >59)
Globulin, Total: 2.7 g/dL (ref 1.5–4.5)
Glucose: 100 mg/dL — ABNORMAL HIGH (ref 65–99)
Potassium: 4 mmol/L (ref 3.5–5.2)
Sodium: 140 mmol/L (ref 134–144)
Total Protein: 7.1 g/dL (ref 6.0–8.5)

## 2019-11-06 LAB — LIPID PANEL
Chol/HDL Ratio: 4.6 ratio (ref 0.0–5.0)
Cholesterol, Total: 253 mg/dL — ABNORMAL HIGH (ref 100–199)
HDL: 55 mg/dL (ref >39)
LDL Chol Calc (NIH): 141 mg/dL — ABNORMAL HIGH (ref 0–99)
Triglycerides: 318 mg/dL — ABNORMAL HIGH (ref 0–149)
VLDL Cholesterol Cal: 57 mg/dL — ABNORMAL HIGH (ref 5–40)

## 2019-11-13 ENCOUNTER — Telehealth: Payer: Self-pay | Admitting: Pharmacist

## 2019-11-13 ENCOUNTER — Telehealth: Payer: Self-pay | Admitting: Internal Medicine

## 2019-11-13 ENCOUNTER — Encounter: Payer: Self-pay | Admitting: Pharmacist

## 2019-11-13 NOTE — Telephone Encounter (Signed)
Patient wife called and would like to speak with Dr. [name] about her husband and getting a handicap sticker to put in their car and also to talk about getting power of attorney paperwork for other practices patient goes to.

## 2019-11-13 NOTE — Telephone Encounter (Signed)
I spoke with patient's wife who requested that she call me back when she has her husbands medications in front of her. Provided her with the clinic # to call back.

## 2019-11-14 ENCOUNTER — Other Ambulatory Visit: Payer: Self-pay

## 2019-11-14 ENCOUNTER — Ambulatory Visit (INDEPENDENT_AMBULATORY_CARE_PROVIDER_SITE_OTHER): Payer: Medicare Other | Admitting: Pharmacist

## 2019-11-14 DIAGNOSIS — Z79899 Other long term (current) drug therapy: Secondary | ICD-10-CM | POA: Diagnosis not present

## 2019-11-14 DIAGNOSIS — I2510 Atherosclerotic heart disease of native coronary artery without angina pectoris: Secondary | ICD-10-CM | POA: Diagnosis not present

## 2019-11-14 DIAGNOSIS — E782 Mixed hyperlipidemia: Secondary | ICD-10-CM

## 2019-11-14 MED ORDER — ROSUVASTATIN CALCIUM 5 MG PO TABS: 5.0000 mg | ORAL_TABLET | Freq: Every day | ORAL | 3 refills | Status: DC

## 2019-11-14 NOTE — Patient Instructions (Signed)
It was a pleasure to meet you today.  Please start taking rosuvastatin 5mg  daily in the evening  Please start taking your Eliquis and metoprolol twice a day  Please call us at [phone] with any questions or concerns  Follow new medication sheet

## 2019-11-14 NOTE — Telephone Encounter (Signed)
Patient wife called back- there is some confusion about what and how patient is taking medication. He has dementia but they do not always let her come up for appointment. Below is what she states patient is taking. I have made them an appointment for them both to come to appointment this afternoon to sort out what medications he should be taking.  AM omepra lisin pred meto eliquis lexapro  repatha twice a month  PM zetia 1/2 lisinopril (does not need extra half) ASA donepezil

## 2019-11-14 NOTE — Progress Notes (Signed)
Patient ID: [name]                 DOB: 1933/02/21                    MRN: [id number]     HPI: [name] is a 85 y.o. male patient referred to lipid clinic by Dr. Radford Pax. PMH is significant for CAD (inferior STEMI 12/2013 with total RCA occlusion s/p DESx2, residual 60-70% mLAD), paroxysmal atrial fib (dx 02/2015), PACs, OSA on CPAP, GERD, HTN, HLD(prior intolerances to other statins). He has moderate OSA .   Patient was previously seen in the lipid clinic and started on [place]. Patient was supposed to continue rosuvastatin 5mg  daily. However patient has not been taking for unknown reasons. LDL is significantly worse than in January.  While talking to patients wife on the phone about lab results she expressed concern over whether patient is taking the correct medications. I offered her an appointment in clinic to review patient medications and she gratefully accepted.  Patient and wife present today to clinic. Patient is a very pleasant, slightly confused man. His wife takes care of his medications. After review of the medication list she uses to make a medication box, there were a few errors discovered. Patient was taking Eliquis only once a day, metoprolol only once a day, was not taking rosuvastatin and was only taking 1/2 tablet of escitalopram.   Wife states that their diet is not horrible. They eat out a lot less now due to [place]. Eats cereal for breakfast. Does eat potatoe chips, likes to eat fish. Drinks bourbon or vodka on the rocks 1-1.5 drinks/day.  Current Medications: zetia 5mg  daily, Repatha 140mg  every 14 days Intolerances: rosuvastatin 20mg  daily - severe muscle pain and weakness, atorvastatin 10mg  daily, pravastatin - myalgias  Risk Factors: CAD s/p STEMI, HTN, age, former tobacco abuse, family history of heart disease LDL goal: 70mg /dL  Diet: Overall eats well, likes greens and lean meat. Does snack on ice cream.  Exercise: Tries to stay active, but does have RA.  Walks every day with his dog for 45 minutes.  Family History: The patient'sfamily history includes Heart disease in his father; Hypertension in his mother. There is no history of Colon cancer.  Social History: Former smoker, quit in 1969. Drinks 1-1.5 glass of liquor each night, denies illicit drug use.  Labs: 11/06/2019 TC 253 TG 318 HDL 55 LDL 141 (zetia 5mg  daily, Repatha 140mg  q 14 days)            10/19/18: TC 166, TG 125, HDL 63, LDL 78 (rosuvastatin 10mg  daily, Zetia 10mg  daily)   Past Medical History:  Diagnosis Date  . CAD (coronary artery disease)    a. s/p inferior STEMI 01/08/2014; LHC 01/08/14: total RCA occlusion s/p 3.5x56mm Xience DES distal RCA and 3.5x28 mm DES mid RCA, 60-70% mid LAD stenosis, EF 55%.  . Complication of anesthesia    'long to wake up after back surgery " 09/2003  . Diverticulosis of colon   . GERD (gastroesophageal reflux disease)   . History of cellulitis    05-26-2015  LLE  . History of colon polyps    1998- benign/  2008 adenomatous   . History of kidney stones    2013  . History of squamous cell carcinoma excision    2013;  2015;  06-12-2015 right leg/  02/ and 05/ 2017  left ear and left leg  . Hydronephrosis, left   .  Hyperlipidemia   . Hypertension   . Migraine with aura   . OSA on CPAP 06/19/2015   Moderate OSA with AHI 18/hr  per study 05-20-2015  . Osteoarthritis   . Paroxysmal atrial fibrillation ([place]) 4/16   chads2vasc score is at least 4  . Premature atrial contractions   . RA (rheumatoid arthritis) [facility])    rheumatologist-  dr Leigh Aurora  . Sinusitis, chronic 10/17/2015  . Wears glasses   . Wears hearing aid    bilateral    Current Outpatient Medications on File Prior to Visit  Medication Sig Dispense Refill  . acetaminophen (TYLENOL) 500 MG tablet Take 1,000 mg by mouth every 6 (six) hours as needed.    [name] aspirin EC 81 MG tablet Take 81 mg by mouth daily.    . clotrimazole-betamethasone (LOTRISONE) cream Apply 1  application topically 4 (four) times daily. To irritated skin (Patient taking differently: Apply 1 application topically 4 (four) times daily as needed (irritated skin). To irritated skin) 30 g 0  . ELIQUIS 5 MG TABS tablet TAKE ONE TABLET TWICE DAILY 180 tablet 1  . escitalopram (LEXAPRO) 20 MG tablet Take 1 tablet (20 mg total) by mouth daily. 90 tablet 3  . Evolocumab (REPATHA SURECLICK) [id number] MG/ML SOAJ Inject 1 pen into the skin every 14 (fourteen) days. 2 pen 11  . ezetimibe (ZETIA) 10 MG tablet TAKE ONE-HALF TABLET DAILY 45 tablet 3  . fexofenadine (ALLEGRA) 180 MG tablet Take 180 mg by mouth daily as needed for allergies or rhinitis.     [name] lisinopril (ZESTRIL) 10 MG tablet Take 1 tablet (10 mg total) by mouth daily. 90 tablet 3  . LORazepam (ATIVAN) 0.5 MG tablet TAKE ONE OR TWO TABLETS TWICE DAILY AS NEEDED FOR ANXIETY 60 tablet 2  . memantine (NAMENDA) 10 MG tablet Take 1 tablet (10 mg total) by mouth 2 (two) times daily. 60 tablet 11  . metoprolol tartrate (LOPRESSOR) 25 MG tablet Take 1 tablet (25 mg total) by mouth 2 (two) times daily. (Patient taking differently: Take 25 mg by mouth daily. ) 180 tablet 2  . nitroGLYCERIN (NITROSTAT) 0.4 MG SL tablet ONE TABLET UNDER TONGUE AS NEEDED FOR CHEST PAIN. MAY REPEAT IN 5 MINUTES AND AGAIN IN 5 MINUTES (TOTAL OF 3 TABLETS) 25 tablet 6  . omeprazole (PRILOSEC) 40 MG capsule TAKE ONE CAPSULE EACH DAY 90 capsule 3  . ondansetron (ZOFRAN) 4 MG tablet Take 1 tablet (4 mg total) by mouth every 8 (eight) hours as needed for nausea or vomiting. 20 tablet 0  . Propylene Glycol (SYSTANE BALANCE OP) Place 2 drops into both eyes daily as needed (dry eyes).     . triamcinolone (KENALOG) 0.1 % paste Use as directed 1 application in the mouth or throat 2 (two) times daily. 5 g 12  . zolpidem (AMBIEN) 10 MG tablet TAKE 1/2 TO 1 TABLET AT BEDTIME AS NEEDED FOR SLEEP 30 tablet 2   No current facility-administered medications on file prior to visit.     Allergies  Allergen Reactions  . Methotrexate Other (See Comments)    REACTION: tachycardia  . Aricept [Donepezil Hcl]     nightmares   . Crestor [Rosuvastatin Calcium]     Myalgias with 20mg  dose  . Lisinopril Other (See Comments)    cough  . Atorvastatin Other (See Comments)    Muscle pain, leg cramps  . Pravastatin Sodium Other (See Comments)    REACTION: cramps, fatigue    Assessment/Plan:  1. Hyperlipidemia - LDL is above goal of <70. I have asked the patient to resume rosuvastatin 5mg  daily. New prescription was sent in to pharmacy. I would not expect patients LDL to go up as much as it did on Repatha. I reviewed injection technique with wife. It does seem as though she may have had difficulty at first, but seem to be correct now. Education/educational materials given to wife about low cholesterol/low triglyceride/heart healthy diet. Encouraged fresh fruits/veggies/meats and avoiding processed foods. Also recommended reducing carbs/sugars/alcohols consumed. Will recheck lipids in 3 months.  2. Medication Management- Reviewed medications and medication use with patient and his wife. I retyped a medication list with AM and PM meds- including medication dose, # of tablets and medication indication. I advised that patient take his Eliquis and metoprolol twice a day. Wife states patient is doing well, therefore, suggested he remain on 10mg  of Lexapro. Patient asked about medication for sleep. Recommended he try a low dose melatonin. Wife did have one bottle of cymbalta that patient is not taking. I removed this from the patients medication bag so that wife would not give by mistake.    Thank you,  Ramond Dial, Pharm.D, BCPS, CPP [place]  [id number] [place], [place] [address]  Phone: [phone]; Fax: [phone]

## 2019-11-21 ENCOUNTER — Other Ambulatory Visit: Payer: Self-pay | Admitting: Internal Medicine

## 2019-11-21 NOTE — Telephone Encounter (Signed)
Wife notified placard is ready to be picked up

## 2019-11-22 ENCOUNTER — Ambulatory Visit: Payer: Medicare Other | Attending: Internal Medicine

## 2019-11-22 DIAGNOSIS — Z20822 Contact with and (suspected) exposure to covid-19: Secondary | ICD-10-CM

## 2019-11-24 LAB — NOVEL CORONAVIRUS, NAA: SARS-CoV-2, NAA: NOT DETECTED

## 2019-11-27 ENCOUNTER — Ambulatory Visit: Payer: Medicare Other

## 2019-12-01 ENCOUNTER — Ambulatory Visit: Payer: Medicare Other

## 2019-12-03 ENCOUNTER — Other Ambulatory Visit: Payer: Self-pay | Admitting: Internal Medicine

## 2019-12-03 DIAGNOSIS — M0609 Rheumatoid arthritis without rheumatoid factor, multiple sites: Secondary | ICD-10-CM | POA: Diagnosis not present

## 2019-12-04 ENCOUNTER — Ambulatory Visit (INDEPENDENT_AMBULATORY_CARE_PROVIDER_SITE_OTHER): Payer: Medicare Other | Admitting: Family

## 2019-12-04 DIAGNOSIS — F4321 Adjustment disorder with depressed mood: Secondary | ICD-10-CM

## 2019-12-04 DIAGNOSIS — R413 Other amnesia: Secondary | ICD-10-CM

## 2019-12-04 NOTE — Progress Notes (Signed)
[name] is a 86 y.o. male with the following history as recorded in EpicCare:  Patient Active Problem List   Diagnosis Date Noted  . Encounter for medication management 11/14/2019  . Angular stomatitis 07/09/2019  . Cervical radiculopathy 06/05/2019  . Concussion 04/17/2019  . Hemoptysis 04/05/2019  . Confusion 04/05/2019  . Memory loss 12/21/2018  . Nausea & vomiting 02/07/2018  . Chills 02/07/2018  . Rash 02/07/2018  . Insomnia 09/22/2017  . Cervical spondylolysis 06/28/2017  . Hyperglycemia 06/22/2017  . Well adult exam 11/18/2016  . Hydronephrosis 11/18/2016  . UPJ obstruction, 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  . Allergic rhinitis 06/02/2016  . Asthma with exacerbation 06/02/2016  . Sinusitis, chronic 10/17/2015  . OSA (obstructive sleep apnea) 06/19/2015  . Cellulitis and abscess of leg, except foot 05/26/2015  . Paroxysmal atrial fibrillation ([place]) 02/21/2015  . CAP (community acquired pneumonia) 01/02/2015  . Upper respiratory infection 12/02/2014  . Vivid dream 09/25/2014  . Actinic keratoses 04/26/2014  . CAD (coronary artery disease) 02/25/2014  . STEMI (ST elevation myocardial infarction) ([place]) 01/08/2014  . Rheumatoid bursitis of left elbow ([place]) 08/08/2013  . Situational depression 06/18/2013  . Neck pain 04/23/2012  . Acute abdominal pain in left flank 02/14/2012  . Hypertension 09/23/2011  . Chest pain, midsternal 09/13/2011  . Mixed hyperlipidemia 07/23/2010  . Palpitations 07/07/2010  . Rheumatoid arthritis ([place]) 06/04/2010  . PARESTHESIA 06/04/2010  . ARTHRALGIA 11/27/2009  . MYALGIA 08/21/2009  . ALLERGIC RHINITIS 06/11/2008  . OSTEOARTHRITIS 06/11/2008  . FATIGUE 06/11/2008  . LOW BACK PAIN 07/27/2007  . COLONIC POLYPS, HX OF 07/27/2007    Current Outpatient Medications  Medication Sig Dispense Refill  . acetaminophen (TYLENOL) 500 MG tablet Take 1,000 mg by mouth every 6 (six) hours as needed.    [name] acidophilus (RISAQUAD) CAPS capsule Take 1 capsule  by mouth daily.    [name] aspirin EC 81 MG tablet Take 81 mg by mouth daily.    [name] donepezil (ARICEPT) 10 MG tablet Take 5 mg by mouth at bedtime.    [name] ELIQUIS 5 MG TABS tablet TAKE ONE TABLET TWICE DAILY 180 tablet 1  . escitalopram (LEXAPRO) 20 MG tablet Take 1 tablet (20 mg total) by mouth daily. (Patient taking differently: Take 10 mg by mouth daily. Patient taking 1/2 tablet) 90 tablet 3  . Evolocumab (REPATHA SURECLICK) 801 MG/ML SOAJ Inject 1 pen into the skin every 14 (fourteen) days. 2 pen 11  . ezetimibe (ZETIA) 10 MG tablet TAKE ONE-HALF TABLET DAILY 45 tablet 3  . lisinopril (ZESTRIL) 10 MG tablet Take 1 tablet (10 mg total) by mouth daily. 90 tablet 3  . metoprolol tartrate (LOPRESSOR) 25 MG tablet Take 1 tablet (25 mg total) by mouth 2 (two) times daily. (Patient taking differently: Take 25 mg by mouth daily. ) 180 tablet 2  . nitroGLYCERIN (NITROSTAT) 0.4 MG SL tablet ONE TABLET UNDER TONGUE AS NEEDED FOR CHEST PAIN. MAY REPEAT IN 5 MINUTES AND AGAIN IN 5 MINUTES (TOTAL OF 3 TABLETS) 25 tablet 6  . omeprazole (PRILOSEC) 40 MG capsule TAKE ONE CAPSULE EACH DAY 90 capsule 3  . Propylene Glycol (SYSTANE BALANCE OP) Place 2 drops into both eyes daily as needed (dry eyes).     . rosuvastatin (CRESTOR) 5 MG tablet Take 1 tablet (5 mg total) by mouth daily. 90 tablet 3  . zolpidem (AMBIEN) 10 MG tablet TAKE 1/2 TO 1 TABLET AT BEDTIME AS NEEDED FOR SLEEP 30 tablet 3   No current facility-administered medications  for this visit.    Allergies: Methotrexate, Aricept [donepezil hcl], Crestor [rosuvastatin calcium], Lisinopril, Atorvastatin, and Pravastatin sodium  Past Medical History:  Diagnosis Date  . CAD (coronary artery disease)    a. s/p inferior STEMI 01/08/2014; LHC 01/08/14: total RCA occlusion s/p 3.5x1m Xience DES distal RCA and 3.5x28 mm DES mid RCA, 60-70% mid LAD stenosis, EF 55%.  . Complication of anesthesia    'long to wake up after back surgery " 09/2003  . Diverticulosis of  colon   . GERD (gastroesophageal reflux disease)   . History of cellulitis    05-26-2015  LLE  . History of colon polyps    1998- benign/  2008 adenomatous   . History of kidney stones    2013  . History of squamous cell carcinoma excision    2013;  2015;  06-12-2015 right leg/  02/ and 05/ 2017  left ear and left leg  . Hydronephrosis, left   . Hyperlipidemia   . Hypertension   . Migraine with aura   . OSA on CPAP 06/19/2015   Moderate OSA with AHI 18/hr  per study 05-20-2015  . Osteoarthritis   . [place] 4[place]16   chads2vasc score is at least 4  . Premature atrial contractions   . RA [facility]rheumatoid arthritis) ([facility]    rheumatologist-  dr jLeigh Aurora . Sinusitis, chronic 10/17/2015  . Wears glasses   . Wears hearing aid    bilateral    Past Surgical History:  Procedure Laterality Date  . BACK SURGERY    . CARDIOVASCULAR STRESS TEST  11/21/2015   Low risk nuclear study w/ a small diaphragmatic attenuation artifact, no ischemia/  normal LV function and wall motion , ef 63%  . CATARACT EXTRACTION W/ INTRAOCULAR LENS  IMPLANT, BILATERAL  2015  . COLONOSCOPY  last one 06-09-2011  . CORONARY ANGIOPLASTY    . CYSTOSCOPY W/ RETROGRADES Left 07/12/2016   Procedure: CYSTOSCOPY WITH RETROGRADE PYELOGRAM LEFT URETERAL STENT;  Surgeon: JIrine Seal MD;  Location: [facility];  Service: Urology;  Laterality: Left;  . CYSTOSCOPY WITH RETROGRADE PYELOGRAM, URETEROSCOPY AND STENT PLACEMENT Left 08/11/2016   Procedure: CYSTOSCOPY WITH RETROGRADE PYELOGRAM,  DIAGNOSTIC URETEROSCOPY , STENT EXCHANGE;  Surgeon: TAlexis Frock MD;  [facility]  Service: Urology;  Laterality: Left;  . 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   severe fibromatosis palm and fingers  . LEFT HEART CATHETERIZATION WITH CORONARY ANGIOGRAM N/A 01/08/2014   Procedure: LEFT HEART CATHETERIZATION WITH CORONARY ANGIOGRAM;  Surgeon: TTroy Sine MD;  [facility];   Service: Cardiovascular;  Laterality:N/A;  total/ subtotal RCA/  mLAD 661-95%w/ mid systolic bridging/  preserved global LVF, ef 55%  . MOHS SURGERY  x2  feb and may 2017   left ear /  left leg  ([facility])  . PERCUTANEOUS CORONARY STENT INTERVENTION (PCI-S)  01/08/2014   Procedure: PERCUTANEOUS CORONARY STENT INTERVENTION (PCI-S);  Surgeon: TTroy Sine MD;  [facility];  Service: Cardiovascular;;  DES to mid and distal RCA  . POSTERIOR LUMBAR FUSION  10/01/2003   and Laminectomy/ diskectomy  L4 -- S1  . ROBOT ASSISTED PYELOPLASTY Left 09/15/2016   Procedure: XI ROBOTIC ASSISTED PYELOPLASTY;  Surgeon: TAlexis Frock MD;  Location: [facility];  Service: Urology;  Laterality: Left;  . TONSILLECTOMY    . TRANSTHORACIC ECHOCARDIOGRAM  02/23/2016   mild LVH,  ef 50-55%,  grade 1 diastolic dysfunction/  mild to moderate AV calcification w/  no stenosis or regurg./  trivial MR and TR/  mild PR    Family History  Problem Relation Age of Onset  . Hypertension Mother   . Heart disease Father   . Colon cancer Neg Hx     Social History   Tobacco Use  . Smoking status: Former Smoker    Years: 10.00    Types: Cigarettes    Quit date: 08/09/1968    Years since quitting: 51.3  . Smokeless tobacco: Never Used  Substance Use Topics  . Alcohol use: Yes    Alcohol/week: 2.0 standard drinks    Types: 2 Shots of liquor per week    Comment: daily    Subjective:    I connected with [name] on 12/04/19 at 10:00 AM EST by a video enabled telemedicine application and verified that I am speaking with the correct person using two identifiers.   I discussed the limitations of evaluation and management by telemedicine and the availability of in person appointments. The patient expressed understanding and agreed to proceed.  Provider in office/ patient is at home; provider and patient and patient's wife are only 3 people on video call.   Unfortunately there was confusion/ misunderstanding about  patient's need for appointment; our schedulers misunderstood acute body aches symptoms- per wife, he has been irritable and grumpy for 3-4 months; she is concerned about an underlying depression; known dementia- due for neurology follow-up at the end of February; recently met with clinical pharmacist at cardiology and medication adjustments were made which do seem to be offering benefit. Currently planning a 2 week vacation to [place], [place] at the end of January and very excited about the potential benefit from a change in scenery.    Objective:  There were no vitals filed for this visit.  General: Well developed, well nourished, in no acute distress  Head: Normocephalic and atraumatic  Lungs: Respirations unlabored;  Neurologic: Alert and oriented; speech intact; face symmetrical;  Assessment:  1. Situational depression   2. Memory loss     Plan:  Can try increasing the Lexapro to 20 mg daily; agree that trip to [place] could be beneficial; keep planned follow-up with neurology; plan to schedule in office appointment with his PCP once they return from [place] to check in; patient and wife are comfortable with this treatment plan.   No follow-ups on file.  No orders of the defined types were placed in this encounter.   Requested Prescriptions    No prescriptions requested or ordered in this encounter

## 2019-12-13 ENCOUNTER — Ambulatory Visit: Payer: Medicare Other

## 2019-12-17 ENCOUNTER — Ambulatory Visit: Payer: Medicare Other | Admitting: Neurology

## 2019-12-17 ENCOUNTER — Telehealth: Payer: Self-pay

## 2019-12-17 NOTE — Telephone Encounter (Signed)
Pts wife called to cancel her husbands vaccine appt because the got them in [place], [place].   Will send a message to the nurse triage.

## 2019-12-17 NOTE — Telephone Encounter (Signed)
Appt canceled per pt's wife request.

## 2019-12-22 ENCOUNTER — Ambulatory Visit: Payer: Medicare Other

## 2019-12-30 ENCOUNTER — Ambulatory Visit: Payer: Medicare Other

## 2019-12-31 ENCOUNTER — Other Ambulatory Visit: Payer: Self-pay | Admitting: Cardiology

## 2019-12-31 ENCOUNTER — Other Ambulatory Visit: Payer: Self-pay | Admitting: Internal Medicine

## 2019-12-31 NOTE — Telephone Encounter (Signed)
Pt last saw Dr Radford Pax 10/30/19 telemedicine Covid-19, last labs 11/06/19 Creat 0.97, age 86, weight 83.5kg, based on specified criteria pt is on appropriate dosage of Eliquis 5mg  BID.  Will refill rx.

## 2020-01-02 DIAGNOSIS — M0609 Rheumatoid arthritis without rheumatoid factor, multiple sites: Secondary | ICD-10-CM | POA: Diagnosis not present

## 2020-01-02 DIAGNOSIS — E663 Overweight: Secondary | ICD-10-CM | POA: Diagnosis not present

## 2020-01-02 DIAGNOSIS — M5030 Other cervical disc degeneration, unspecified cervical region: Secondary | ICD-10-CM | POA: Diagnosis not present

## 2020-01-04 ENCOUNTER — Telehealth (INDEPENDENT_AMBULATORY_CARE_PROVIDER_SITE_OTHER): Payer: Medicare Other | Admitting: Neurology

## 2020-01-04 ENCOUNTER — Encounter: Payer: Self-pay | Admitting: Neurology

## 2020-01-04 ENCOUNTER — Other Ambulatory Visit: Payer: Self-pay

## 2020-01-04 DIAGNOSIS — F0390 Unspecified dementia without behavioral disturbance: Secondary | ICD-10-CM | POA: Diagnosis not present

## 2020-01-04 DIAGNOSIS — F03A0 Unspecified dementia, mild, without behavioral disturbance, psychotic disturbance, mood disturbance, and anxiety: Secondary | ICD-10-CM

## 2020-01-04 MED ORDER — MEMANTINE HCL 10 MG PO TABS: ORAL_TABLET | ORAL | 11 refills | Status: DC

## 2020-01-04 NOTE — Progress Notes (Signed)
Virtual Visit via Video Note The purpose of this virtual visit is to provide medical care while limiting exposure to the novel coronavirus.    Consent was obtained for video visit:  Yes.   Answered questions that patient had about telehealth interaction:  Yes.   I discussed the limitations, risks, security and privacy concerns of performing an evaluation and management service by telemedicine. I also discussed with the patient that there may be a patient responsible charge related to this service. The patient expressed understanding and agreed to proceed.  Pt location: Home Physician Location: office Name of referring provider:  Plotnikov, Evie Lacks, MD I connected with [name] at patients initiation/request on 01/04/2020 at  2:00 PM EST by video enabled telemedicine application and verified that I am speaking with the correct person using two identifiers. Pt MRN:  [id number] Pt DOB:  05-05-1933 Video Participants:  [name];  [name] (spouse)   History of Present Illness:  The patient was seen as a virtual video visit on 01/04/2020. He was last evaluated in the neurology clinic by telephonic communication 6 months ago. His wife is present during the e-visit to provide additional information. Since his last visit, he states "I do have memory problems." His wife feels one of the big things is his patience with himself, she is also worried about his driving. He denies getting lost driving, however his wife reminds him of an incident 3 months ago when he drove outside their zipcode to meet someone, he forget where they were meeting and had to call his wife. She does most of the driving. His wife manages his medications and finances. He denies misplacing things, he states he forgets appointments, his wife reports he misplaces things. He had side effects of nightmares on Donepezil and has not been taking it. On his last visit, Memantine was on his medication list, it is not currently on it  but his wife is not sure when or why it was stopped. She stopped the Lexapro when he had trouble sleeping, which seems to have helped. He sleeps during the day, he states he does not sleep, he just lays on top of the bed. He denies any headaches, dizziness, focal numbness/tingling/weakness. His last fall was in August when he bumped his head going to the bathroom and had a concussion. His wife has turned on the lights and he has been doing well since. No paranoia or hallucinations.   History on Initial Assessment 04/11/2019: This is an 86 year old right-handed man with a history of hypertension, hyperlipidemia, OSA on CPAP, CAD, presenting for evaluation of worsening memory. He feels his memory is okay, he gets confused some, "my wife tells me I forget things." He makes jokes several times during the visit. His wife started noticing changes the beginning of the year. He started having personality changes where he would just explode for no reason. He was started on Lexapro which has significantly helped. She has noticed short term memory issues, and was missing bills that she had to take over in January. He was also starting to miss medications, his wife has started helping. Last night he was very concerned that he had not taken his medications. He has gotten lost driving, last week he could not remember what errand he went to do and was driving around. He could not remember where he would be going, but then figures it out. He repeatedly says he misplaced his hearing aids. His wife was driving yesterday  and they brought her car to be serviced, after going to another place, he could not remember that they had brought her car in earlier. He had an unusual episode last week when he came upstairs and told his wife that they had to go and leave now, he was dressed in 2 pairs of boxer shorts and his shirt on backward (unusual, he is usually a fastidious dresser per wife), ready to go somewhere late at night. She found  that he had pulled out his clothes and medications and wrapped his medications in his khaki pants. She directed him to bed and he went to sleep. He saw his PCP and had normal bloodwork and urinalysis. I personally reviewed head CT without contrast done 5/26 which did not show any acute changes, there was diffuse atrophy and chronic microvascular disease. His wife notes he is much more alert in the daytime and more confused at night. He was started on Donepezil 5mg  daily, which he is tolerating without side effects. There is no family history of dementia, no history of significant head injuries. He usually has 1-2 alcohol drinks at night.   He denies any headaches, dizziness (except upon standing quickly), diplopia, dysarthria/dysphagia, neck/back pain, focal numbness/tingling/weakness, bowel dysfunction, anosmia. He has infrequent incontinence. He has had right hand shaking occasionally around 3-4 times a week. He has a history of sleepwalking. His wife has not noticed any staring/unresponsive episodes. No paranoia or hallucinations. He has trouble sleeping and takes 1/2 tablet of Ambien. He has fallen a coupld of times, last fall was 3 weeks ago.    Current Outpatient Medications on File Prior to Visit  Medication Sig Dispense Refill  . acetaminophen (TYLENOL) 500 MG tablet Take 1,000 mg by mouth every 6 (six) hours as needed.    [name] acidophilus (RISAQUAD) CAPS capsule Take 1 capsule by mouth daily.    [name] aspirin EC 81 MG tablet Take 81 mg by mouth daily.    [name] ELIQUIS 5 MG TABS tablet TAKE ONE TABLET TWICE DAILY 180 tablet 1  . ezetimibe (ZETIA) 10 MG tablet TAKE ONE-HALF TABLET DAILY 45 tablet 3  . lisinopril (ZESTRIL) 10 MG tablet Take 1 tablet (10 mg total) by mouth daily. 90 tablet 3  . omeprazole (PRILOSEC) 40 MG capsule TAKE ONE CAPSULE EACH DAY 90 capsule 3  . Propylene Glycol (SYSTANE BALANCE OP) Place 2 drops into both eyes daily as needed (dry eyes).     [name] REPATHA SURECLICK [id number] MG/ML SOAJ  INJECT 1 PEN INTO SKIN EVERY 14 DAYS 2 mL 11  . rosuvastatin (CRESTOR) 5 MG tablet Take 1 tablet (5 mg total) by mouth daily. 90 tablet 3  . zolpidem (AMBIEN) 10 MG tablet TAKE 1/2 TO 1 TABLET AT BEDTIME AS NEEDED FOR SLEEP 30 tablet 3  . donepezil (ARICEPT) 10 MG tablet Take 5 mg by mouth at bedtime.    [name] escitalopram (LEXAPRO) 20 MG tablet Take 1 tablet (20 mg total) by mouth daily. (Patient not taking: Reported on 01/04/2020) 90 tablet 3  . nitroGLYCERIN (NITROSTAT) 0.4 MG SL tablet ONE TABLET UNDER TONGUE AS NEEDED FOR CHEST PAIN. MAY REPEAT IN 5 MINUTES AND AGAIN IN 5 MINUTES (TOTAL OF 3 TABLETS) (Patient not taking: Reported on 01/04/2020) 25 tablet 6   No current facility-administered medications on file prior to visit.     Observations/Objective:   GEN:  The patient appears stated age and is in NAD.  Neurological examination: Patient is awake, alert, oriented x 3. No aphasia  or dysarthria. Intact fluency and comprehension. Remote and recent memory impaired. SLUMS 26/30. Cranial nerves: Extraocular movements intact with no nystagmus. No facial asymmetry. Motor: moves all extremities symmetrically, at least anti-gravity x 4.  [employer] Mental Exam 01/04/2020  Weekday Correct 1  Current year 1  What state are we in? 1  Amount spent 1  Amount left 2  # of Animals 2  5 objects recall 2  Number series 2  Hour markers 2  Time correct 2  Placed X in triangle correctly 1  Largest Figure 1  Name of male 2  Date back to work 2  Type of work 2  State she lived in 2  Total score 26    Assessment and Plan:   This is a pleasant 86 yo RH man with a history of hypertension, hyperlipidemia, OSA on CPAP, CAD, with mild dementia. Head CT showed diffuse atrophy and chronic microvascular disease. SLUMS score today 26/30. He had side effects on Donepezil, it is unclear why Memantine was stopped, restart Memantine 10mg  qhs x 2 weeks, then increase to 1 tab BID. His wife is concerned  about driving, we discussed doing Neurocognitive testing to further evaluate cognitive complaints and correlation with driving. We will plan to restart an SSRI after Neurocognitive testing. Continue to monitor driving. Follow-up in 6 months, they know to call for any changes.   Follow Up Instructions:   -I discussed the assessment and treatment plan with the patient/wife. The patient/wife were provided an opportunity to ask questions and all were answered. The patient/wife agreed with the plan and demonstrated an understanding of the instructions.   The patient/wife were advised to call back or seek an in-person evaluation if the symptoms worsen or if the condition fails to improve as anticipated.     Cameron Sprang, MD

## 2020-01-07 DIAGNOSIS — M0609 Rheumatoid arthritis without rheumatoid factor, multiple sites: Secondary | ICD-10-CM | POA: Diagnosis not present

## 2020-01-08 DIAGNOSIS — L821 Other seborrheic keratosis: Secondary | ICD-10-CM | POA: Diagnosis not present

## 2020-01-08 DIAGNOSIS — D1801 Hemangioma of skin and subcutaneous tissue: Secondary | ICD-10-CM | POA: Diagnosis not present

## 2020-01-08 DIAGNOSIS — L308 Other specified dermatitis: Secondary | ICD-10-CM | POA: Diagnosis not present

## 2020-01-08 DIAGNOSIS — Z85828 Personal history of other malignant neoplasm of skin: Secondary | ICD-10-CM | POA: Diagnosis not present

## 2020-01-08 DIAGNOSIS — L570 Actinic keratosis: Secondary | ICD-10-CM | POA: Diagnosis not present

## 2020-01-08 DIAGNOSIS — L812 Freckles: Secondary | ICD-10-CM | POA: Diagnosis not present

## 2020-01-18 ENCOUNTER — Other Ambulatory Visit: Payer: Self-pay | Admitting: Internal Medicine

## 2020-01-18 MED ORDER — PREDNISONE 10 MG PO TABS: ORAL_TABLET | ORAL | 1 refills | Status: DC

## 2020-02-04 DIAGNOSIS — M0609 Rheumatoid arthritis without rheumatoid factor, multiple sites: Secondary | ICD-10-CM | POA: Diagnosis not present

## 2020-02-07 ENCOUNTER — Telehealth: Payer: Self-pay | Admitting: Pharmacist

## 2020-02-07 DIAGNOSIS — E782 Mixed hyperlipidemia: Secondary | ICD-10-CM

## 2020-02-07 NOTE — Telephone Encounter (Signed)
Called and left VM on wife's machine (on DPR) for her to return call. Calling to set up lipid labs. Also calling to follow up if patient is still taking rosuvastatin. Was resumed 11/14/2019 but was discontinued on 3/5 and listed as side effect, but there was no visit/mychart message/or telephone call that was associated with it.

## 2020-02-12 NOTE — Telephone Encounter (Signed)
   Pt's wife returning call from [place], she said she would like to speak with her.  Please call

## 2020-02-12 NOTE — Telephone Encounter (Signed)
Spoke with wife. At first she didn't think patient was taking rosuvastatin, but then she found the bottle and states she thinks he is. Will add back to med list. Patient has appointment on 4/19 with Dr. Rayann Heman. Will have him get labs same day.

## 2020-02-12 NOTE — Addendum Note (Signed)
Addended by: Marcelle Overlie D on: 02/12/2020 04:59 PM   Modules accepted: Orders

## 2020-02-21 ENCOUNTER — Other Ambulatory Visit: Payer: Self-pay | Admitting: Internal Medicine

## 2020-02-21 NOTE — Telephone Encounter (Signed)
Pt's pharmacy is requesting a refill on metoprolol. This medication was taken off of pt's medication list. I do not see where the doctor D/C this medication. Does pt still supposed to be taking this medication? Please address

## 2020-03-03 ENCOUNTER — Ambulatory Visit: Payer: Medicare Other | Admitting: Internal Medicine

## 2020-03-03 ENCOUNTER — Other Ambulatory Visit: Payer: Self-pay

## 2020-03-03 ENCOUNTER — Other Ambulatory Visit: Payer: Medicare Other | Admitting: *Deleted

## 2020-03-03 DIAGNOSIS — E782 Mixed hyperlipidemia: Secondary | ICD-10-CM

## 2020-03-03 LAB — LIPID PANEL
Chol/HDL Ratio: 3.2 ratio (ref 0.0–5.0)
Cholesterol, Total: 203 mg/dL — ABNORMAL HIGH (ref 100–199)
HDL: 64 mg/dL (ref >39)
LDL Chol Calc (NIH): 93 mg/dL (ref 0–99)
Triglycerides: 275 mg/dL — ABNORMAL HIGH (ref 0–149)
VLDL Cholesterol Cal: 46 mg/dL — ABNORMAL HIGH (ref 5–40)

## 2020-03-03 LAB — HEPATIC FUNCTION PANEL
ALT: 11 IU/L (ref 0–44)
AST: 13 IU/L (ref 0–40)
Albumin: 4 g/dL (ref 3.6–4.6)
Alkaline Phosphatase: 69 IU/L (ref 39–117)
Bilirubin Total: 0.4 mg/dL (ref 0.0–1.2)
Bilirubin, Direct: 0.1 mg/dL (ref 0.00–0.40)
Total Protein: 7 g/dL (ref 6.0–8.5)

## 2020-03-04 DIAGNOSIS — M0609 Rheumatoid arthritis without rheumatoid factor, multiple sites: Secondary | ICD-10-CM | POA: Diagnosis not present

## 2020-03-05 ENCOUNTER — Encounter: Payer: Medicare Other | Admitting: Counselor

## 2020-03-05 NOTE — Progress Notes (Deleted)
Patient forgot appointment. Rescheduled.

## 2020-03-05 NOTE — Progress Notes (Signed)
[facility]  Patient Name: [name] MRN: [id number] Date of Birth: 07-Aug-1933 Age: 86 y.o. Education: 8 years  Referral Circumstances and Background Information  [name] is a 86 y.o., right-hand dominant, married man with a history of mild dementia diagnosed by Dr. Delice Lesch in May, 2020. His MoCA was 24/30 at that time but his wife reported day-to-day forgetfulness, increased irritability that was improved with Lexapro, and functional decline (i.e., she took over the finances due to missed bills in January, 2020 and started helping with medications, he also has gotten lost while driving). Episodic periods of confusion typically at night were also noted, as described in detail in Dr. Amparo Bristol 04/11/2019 office visit note.    On interview, the patient says he is having a bit of problems with memory and thinking but doesn't sound particularly concerned. He clearly has a bit of anosognosia, and wonders why his children "side with" his wife. They have traditional gender roles and taking care of the patient is not comfortable for his wife. His wife first noticed changes a year ago or so ago when they were in [place] and he became very agitated, which is not his way. She has also noticed memory problems since then, which she didn't think became noticeable until last summer. She notices him repeating himself and asking the same questions over and over. He has made comments that "I think I'm going crazy." He continues with significant agitation, at times, which is often directed at his wife when she notices his cognitive difficulties and points them out. She thinks that she "drives him crazy" with her concern. There is no physical violence and there were no safety concerns. They denied any other notable behavior changes including compulsiveness, repetitiveness, inappropriate social behaviors, or strange new food cravings. The patient denied any problems with mood  although his wife stated that he has been moping around to an extent. He will often sleep throughout the day, he will get up to eat breakfast, then he will go back to bed, then he will get up for lunch, then go back to bed. She estimated that he spends about 2.5-3 hours a day in bed. The patient stated that he is reading and thinks his wife is overreacting. He wasn't sure how well he is sleeping and his wife sleeps upstairs so she isn't sure how much he sleep gets. They haven't noticed any dream enactment behavior. The patient said his energy is good, but as above, it sounds like he is spending a lot of time in bed.   With respect to functioning, the patient is still driving although he has gotten lost and it sounds like his wife is concerned (this was in November). This is typically when he travels out of town. Dr. [name] recommended that he drive only locally and he has been fairly good about that. His wife has always managed the money. His wife has started helping him with medications, she has been doing that for more than a year, although he has been taking more medications over this time period. The patient's wife helps him manage his appointments (sounds like she has for some time). She attends all his medical appointments over about the past year.   Past Medical History and Review of Relevant Studies   Patient Active Problem List   Diagnosis Date Noted  . Encounter for medication management 11/14/2019  . Angular stomatitis 07/09/2019  . Cervical radiculopathy 06/05/2019  . Pain in joint of left  shoulder 05/31/2019  . Concussion 04/17/2019  . Hemoptysis 04/05/2019  . Confusion 04/05/2019  . Memory loss 12/21/2018  . Nausea & vomiting 02/07/2018  . Chills 02/07/2018  . Rash 02/07/2018  . Insomnia 09/22/2017  . Cervical spondylolysis 06/28/2017  . Hyperglycemia 06/22/2017  . Well adult exam 11/18/2016  . Hydronephrosis 11/18/2016  . UPJ obstruction, 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  . Allergic  rhinitis 06/02/2016  . Asthma with exacerbation 06/02/2016  . Sinusitis, chronic 10/17/2015  . OSA (obstructive sleep apnea) 06/19/2015  . Cellulitis and abscess of leg, except foot 05/26/2015  . Paroxysmal atrial fibrillation ([place]) 02/21/2015  . CAP (community acquired pneumonia) 01/02/2015  . Upper respiratory infection 12/02/2014  . Vivid dream 09/25/2014  . Actinic keratoses 04/26/2014  . CAD (coronary artery disease) 02/25/2014  . STEMI (ST elevation myocardial infarction) ([place]) 01/08/2014  . Rheumatoid bursitis of left elbow ([place]) 08/08/2013  . Situational depression 06/18/2013  . Neck pain 04/23/2012  . Acute abdominal pain in left flank 02/14/2012  . Hypertension 09/23/2011  . Chest pain, midsternal 09/13/2011  . Mixed hyperlipidemia 07/23/2010  . Palpitations 07/07/2010  . Rheumatoid arthritis ([place]) 06/04/2010  . PARESTHESIA 06/04/2010  . ARTHRALGIA 11/27/2009  . MYALGIA 08/21/2009  . ALLERGIC RHINITIS 06/11/2008  . OSTEOARTHRITIS 06/11/2008  . FATIGUE 06/11/2008  . LOW BACK PAIN 07/27/2007  . COLONIC POLYPS, HX OF 07/27/2007    Review of Neuroimaging and Relevant Medical History: :  [name] has a CT of the head from 04/13/2019 that shows significant, confluent areas of periventricular hypodensity suggestive of severe cerebrovascular disease and a likely pathological burden of atrophy, more in the mesial temporal areas. The imaging raises concern about the possibility of mixed vascular and neurodegenerative (Alzheimer's) dementia.   Current Outpatient Medications  Medication Sig Dispense Refill  . acetaminophen (TYLENOL) 500 MG tablet Take 1,000 mg by mouth every 6 (six) hours as needed.    [name] acidophilus (RISAQUAD) CAPS capsule Take 1 capsule by mouth daily.    [name] aspirin EC 81 MG tablet Take 81 mg by mouth daily.    [name] donepezil (ARICEPT) 10 MG tablet Take 5 mg by mouth at bedtime.    [name] ELIQUIS 5 MG TABS tablet TAKE ONE TABLET TWICE DAILY 180 tablet 1  .  escitalopram (LEXAPRO) 20 MG tablet Take 1 tablet (20 mg total) by mouth daily. (Patient not taking: Reported on 01/04/2020) 90 tablet 3  . ezetimibe (ZETIA) 10 MG tablet TAKE ONE-HALF TABLET DAILY 45 tablet 3  . lisinopril (ZESTRIL) 10 MG tablet Take 1 tablet (10 mg total) by mouth daily. 90 tablet 3  . memantine (NAMENDA) 10 MG tablet Take 1 tablet at night for 2 weeks, then increase to 1 tablet twice a day 60 tablet 11  . metoprolol tartrate (LOPRESSOR) 25 MG tablet TAKE ONE TABLET TWICE DAILY 180 tablet 2  . nitroGLYCERIN (NITROSTAT) 0.4 MG SL tablet ONE TABLET UNDER TONGUE AS NEEDED FOR CHEST PAIN. MAY REPEAT IN 5 MINUTES AND AGAIN IN 5 MINUTES (TOTAL OF 3 TABLETS) (Patient not taking: Reported on 01/04/2020) 25 tablet 6  . omeprazole (PRILOSEC) 40 MG capsule TAKE ONE CAPSULE EACH DAY 90 capsule 3  . predniSONE (DELTASONE) 10 MG tablet Take 20 mg every morning for 2 weeks, then 50 mg every morning for 2 weeks, then 10 mg every morning.  Take with food. 100 tablet 1  . Propylene Glycol (SYSTANE BALANCE OP) Place 2 drops into both eyes daily as needed (dry eyes).     [name] REPATHA  SURECLICK [id number] MG/ML SOAJ INJECT 1 PEN INTO SKIN EVERY 14 DAYS 2 mL 11  . rosuvastatin (CRESTOR) 5 MG tablet Take 1 tablet (5 mg total) by mouth daily. 90 tablet 3  . zolpidem (AMBIEN) 10 MG tablet TAKE 1/2 TO 1 TABLET AT BEDTIME AS NEEDED FOR SLEEP 30 tablet 3   No current facility-administered medications for this visit.   Family History  Problem Relation Age of Onset  . Hypertension Mother   . Heart disease Father   . Colon cancer Neg Hx     There is a family history of dementia. His mother lived to 17 and was apparently of sound mind. His father died in his 59s and was not demented. He had an older sister who developed some "memory issues," in her 17s.. There is no  family history of psychiatric illness.  Psychosocial History  Developmental, Educational and Employment History: The patient stated that he "wasn't a  real good student," but he was a good athlete and stated that he was able to do well as a result. He denied ever being held back or having any learning difficulties. He went on to attend college and earned a Bachelor's degree in business. He went to [employer], and [employer]. The patient is a veteran of the [employer] and was in OCS but was medically discharged related to sleep walking apparently. 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 He retired about 15 years ago.   Psychiatric History: The patient denied any history of mental health involvement prior to recently. He completed a trial of Lexapro but is no longer taking it because he was having a hard time sleeping.   Substance Use History: The patient stated that he "abused alcohol" in the past and has been trying to cut back. It sounds like he doesn't drink heavily now, he has one to two drinks a day. He quit smoking 50 years ago. No drug use.   Relationship History and Living Cimcumstances: The patient and his wife have been married 55 years. They have two children, their daughter lives locally, and they have two grandchildren who are in college. Their son lives in [place].   Mental Status and Behavioral Observations  Sensorium/Arousal: The patient's level of arousal was awake and alert. Hearing and vision were adequate with correction (used glasses and hearing aids) for testing purposes. Orientation: The patient was oriented to person, place, and situation but was not well oriented to time (he had a hard time with the year and date, reported month as March).  Appearance: Dressed neatly in casual clothing.  Behavior: Seemed a bit irritated at his wife reporting his history Speech/language: Normal in rate, rhythm, volume, and prosody. He did have some occasional word finding errors in spontaneous speech.  Gait/Posture: Not well observed Movement: No overt signs/symptoms  of movement disorder  Social Comportment: Appropriate, although somewhat argumentative with wife's history Mood: "I'm not an unhappy person" Affect: Mainly euthymic with an undertone of some irritability given topics discussed Thought process/content: Thought process was somewhat circumstantial though not overtly disorganized. Content was appropriate. Did have a tendency to go on tangents about historical information.  Safety: No safety concerns were identified at today's visit.  Insight: Limited  Montreal Cognitive Assessment  03/10/2020 04/16/2019  Visuospatial/ Executive (0/5) 1 3  Naming (0/3) 3 3  Attention: Read list of digits (0/2) 2 2  Attention: Read list of letters (  0/1) 1 1  Attention: Serial 7 subtraction starting at 100 (0/3) 3 3  Language: Repeat phrase (0/2) 1 1  Language : Fluency (0/1) 1 1  Abstraction (0/2) 1 2  Delayed Recall (0/5) 0 2  Orientation (0/6) 3 6  Total 16 24  Adjusted Score (based on education) 16 -   Test Procedures  Wide Range Achievement Test - 4             Word Reading Neuropsychological Assessment Battery  List Learning  Story Learning  Daily Living Memory  Naming  Digit Span Repeatable Battery for the Assessment of Neuropsychological Status (Form A)  Figure Copy  Judgment of Line Orientation  Coding  Figure Recall The Dot Counting Test A Random Letter Test Controlled Oral Word Association (F-A-S) Semantic Fluency (Animals) Trail Making Test A & B Complex Ideational Material Geriatric Depression Scale - Short Form Quick Dementia Rating System (Completed by wife, [name])  Plan  [name] was seen for a psychiatric diagnostic evaluation and neuropsychological testing. On interview, he has a history of agitation and memory problems, over about the past year with some corresponding decline. He has a fair amount of volume loss with a bit more in the mesial temporal areas on his CT from 2020 and also has moderate to severe hypodensity  suggestive of white matter ischemia. Differential favors mixed vascular and neurodegenerative dementia but his test data is pending and will be helpful to further pin that down. Full and complete note with impressions, recommendations, and interpretation of test data to follow.   Viviano Simas Nicole Kindred, PsyD, [place] Clinical Neuropsychologist  Informed Consent and Coding/Compliance  Risks and benefits of the evaluation were discussed with the patient as were the limits of confidentiality. I conducted a clinical interview and neuropsychological testing (more than two tests) with Quin Hoop Kabir and Lamar Benes, B.S. (Technician) assisted me in administering additional test procedures. The patient was able to tolerate the testing procedures and the patient (and/or family if applicable) is likely to benefit from further follow up to receive the diagnosis and treatment recommendations, which will be rendered at the next encounter. Billing below reflects technician time, my direct face-to-face time with the patient, time spent in test administration, and time spent in professional activities including but not limited to: neuropsychological test interpretation, integration of neuropsychological test data with clinical history, report preparation, treatment planning, care coordination, and review of diagnostically pertinent medical history or studies.   Services associated with this encounter: Clinical Interview [phone]) plus 60 minutes [id number]; Neuropsychological Evaluation by Professional)  120 minutes [id number]; Neuropsychological Evaluation by Professional, Adl.) 30 minutes [id number]; Test Administration by Professional) 30 minutes [id number]; Neuropsychological Testing by Technician) 65 minutes [id number]; Neuropsychological Testing by Technician, Adl.)

## 2020-03-05 NOTE — Progress Notes (Signed)
Cardiology Office Note Date:  03/06/2020  Patient ID:  [name], [name] 28-Jul-1933, MRN [id number] PCP:  Evan Anger, MD  Cardiologist:  Dr. Radford Pax EP: Dr. Rayann Heman    Chief Complaint:  annual f/u  History of Present Illness: [name] is a 86 y.o. male with history of CAD (inferior STEMI 12/2013 with total RCA occlusion s/p DESx2, residual 60-70% mLAD), AFib, OSA w/CPAP, GERD, HTN, HLD (statin intolerant) on Repatha being followed by lipid clinic, RA  He comes in today to be seen for Dr. Rayann Heman, last seen by him April 2020.  He was active, some myalgias that he attributed to his arthritis that was worsening.  No changes were made.  More recently saw Dr. Radford Pax Dec 2020 via tele-health, had not been using his CPAP, feeling OK, walking his dog daily with good exertional capacity.  His lisinopril increased for HTN, counseled on CPAP use, planned to update his labs.  He is accompanied by his wife, his memory is declining.  Seeing neurology for evaluation next week 2.2 this.  He feels well, no CP, palpitations or cardiac awareness of any kind.  No SOB or DOE.  He walks his dog every day, gets out in the yard, feels like he has very good exertional capacity.  No near syncope or syncope.  He will occasionally have a fleeting lightheadedness when standing.  He (they) reports having full labs annually with his PMD.  He denies any bleeding or signs of bleeding.  He does not think he has had any AFib.   AFib Hx Diagnosed 2016  AAD hx None to date   Past Medical History:  Diagnosis Date  . CAD (coronary artery disease)    a. s/p inferior STEMI 01/08/2014; LHC 01/08/14: total RCA occlusion s/p 3.5x66mm Xience DES distal RCA and 3.5x28 mm DES mid RCA, 60-70% mid LAD stenosis, EF 55%.  . Complication of anesthesia    'long to wake up after back surgery " 09/2003  . Diverticulosis of colon   . GERD (gastroesophageal reflux disease)   . History of cellulitis    05-26-2015  LLE    . History of colon polyps    1998- benign/  2008 adenomatous   . History of kidney stones    2013  . History of squamous cell carcinoma excision    2013;  2015;  06-12-2015 right leg/  02/ and 05/ 2017  left ear and left leg  . Hydronephrosis, left   . Hyperlipidemia   . Hypertension   . Migraine with aura   . OSA on CPAP 06/19/2015   Moderate OSA with AHI 18/hr  per study 05-20-2015  . Osteoarthritis   . Paroxysmal atrial fibrillation ([place]) 4/16   chads2vasc score is at least 4  . Premature atrial contractions   . RA (rheumatoid arthritis) [facility])    rheumatologist-  dr Leigh Aurora  . Sinusitis, chronic 10/17/2015  . Wears glasses   . Wears hearing aid    bilateral    Past Surgical History:  Procedure Laterality Date  . BACK SURGERY    . CARDIOVASCULAR STRESS TEST  11/21/2015   Low risk nuclear study w/ a small diaphragmatic attenuation artifact, no ischemia/  normal LV function and wall motion , ef 63%  . CATARACT EXTRACTION W/ INTRAOCULAR LENS  IMPLANT, BILATERAL  2015  . COLONOSCOPY  last one 06-09-2011  . CORONARY ANGIOPLASTY    . CYSTOSCOPY W/ RETROGRADES Left 07/12/2016   Procedure: CYSTOSCOPY WITH RETROGRADE  PYELOGRAM LEFT URETERAL STENT;  Surgeon: Irine Seal, MD;  Location: [facility];  Service: Urology;  Laterality: Left;  . CYSTOSCOPY WITH RETROGRADE PYELOGRAM, URETEROSCOPY AND STENT PLACEMENT Left 08/11/2016   Procedure: CYSTOSCOPY WITH RETROGRADE PYELOGRAM,  DIAGNOSTIC URETEROSCOPY , STENT EXCHANGE;  Surgeon: Alexis Frock, MD;  Location: [facility];  Service: Urology;  Laterality: Left;  . 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   severe fibromatosis palm and fingers  . LEFT HEART CATHETERIZATION WITH CORONARY ANGIOGRAM N/A 01/08/2014   Procedure: LEFT HEART CATHETERIZATION WITH CORONARY ANGIOGRAM;  Surgeon: Troy Sine, MD;  Location: [facility] CATH LAB;  Service: Cardiovascular;  Laterality:N/A;  total/ subtotal RCA/  mLAD [id number] w/ mid systolic  bridging/  preserved global LVF, ef 55%  . MOHS SURGERY  x2  feb and may 2017   left ear /  left leg  ([facility])  . PERCUTANEOUS CORONARY STENT INTERVENTION (PCI-S)  01/08/2014   Procedure: PERCUTANEOUS CORONARY STENT INTERVENTION (PCI-S);  Surgeon: Troy Sine, MD;  Location: [facility] C[facility] LAB;  Service: Cardiovascular;;  DES to mid and distal RCA  . POSTERIOR LUMBAR FUSION  10/01/2003   and Laminectomy/ diskectomy  L4 -- S1  . ROBOT ASSISTED PYELOPLASTY Left 09/15/2016   Procedure: XI ROBOTIC ASSISTED PYELOPLASTY;  Surgeon: Alexis Frock, MD;  Location: [facility];  Service: Urology;  Laterality: Left;  . TONSILLECTOMY    . TRANSTHORACIC ECHOCARDIOGRAM  02/23/2016   mild LVH,  ef 50-55%,  grade 1 diastolic dysfunction/  mild to moderate AV calcification w/ no stenosis or regurg./  trivial MR and TR/  mild PR    Current Outpatient Medications  Medication Sig Dispense Refill  . acetaminophen (TYLENOL) 500 MG tablet Take 1,000 mg by mouth every 6 (six) hours as needed.    [name] acidophilus (RISAQUAD) CAPS capsule Take 1 capsule by mouth daily.    [name] aspirin EC 81 MG tablet Take 81 mg by mouth daily.    [name] donepezil (ARICEPT) 10 MG tablet Take 5 mg by mouth at bedtime.    [name] ELIQUIS 5 MG TABS tablet TAKE ONE TABLET TWICE DAILY 180 tablet 1  . escitalopram (LEXAPRO) 20 MG tablet Take 1 tablet (20 mg total) by mouth daily. 90 tablet 3  . ezetimibe (ZETIA) 10 MG tablet TAKE ONE-HALF TABLET DAILY 45 tablet 3  . lisinopril (ZESTRIL) 10 MG tablet Take 1 tablet (10 mg total) by mouth daily. 90 tablet 3  . memantine (NAMENDA) 10 MG tablet Take 1 tablet at night for 2 weeks, then increase to 1 tablet twice a day 60 tablet 11  . metoprolol tartrate (LOPRESSOR) 25 MG tablet TAKE ONE TABLET TWICE DAILY 180 tablet 2  . nitroGLYCERIN (NITROSTAT) 0.4 MG SL tablet ONE TABLET UNDER TONGUE AS NEEDED FOR CHEST PAIN. MAY REPEAT IN 5 MINUTES AND AGAIN IN 5 MINUTES (TOTAL OF 3 TABLETS) 25 tablet 6  . omeprazole (PRILOSEC) 40 MG  capsule TAKE ONE CAPSULE EACH DAY 90 capsule 3  . predniSONE (DELTASONE) 10 MG tablet Take 20 mg every morning for 2 weeks, then 50 mg every morning for 2 weeks, then 10 mg every morning.  Take with food. 100 tablet 1  . Propylene Glycol (SYSTANE BALANCE OP) Place 2 drops into both eyes daily as needed (dry eyes).     [name] REPATHA SURECLICK [id number] MG/ML SOAJ INJECT 1 PEN INTO SKIN EVERY 14 DAYS 2 mL 11  . rosuvastatin (CRESTOR) 5 MG tablet Take 1 tablet (5 mg total) by mouth daily. Arjay  tablet 3  . zolpidem (AMBIEN) 10 MG tablet TAKE 1/2 TO 1 TABLET AT BEDTIME AS NEEDED FOR SLEEP 30 tablet 3   No current facility-administered medications for this visit.    Allergies:   Methotrexate, Aricept [donepezil hcl], Crestor [rosuvastatin calcium], Lisinopril, Atorvastatin, and Pravastatin sodium   Social History:  The patient  reports that he quit smoking about 51 years ago. His smoking use included cigarettes. He quit after 10.00 years of use. He has never used smokeless tobacco. He reports current alcohol use of about 2.0 standard drinks of alcohol per week. He reports that he does not use drugs.   Family History:  The patient's family history includes Heart disease in his father; Hypertension in his mother.  ROS:  Please see the history of present illness.    All other systems are reviewed and otherwise negative.   PHYSICAL EXAM:  VS:  BP 124/78   Pulse 64   Ht 6' (1.829 m)   Wt 188 lb (85.3 kg)   SpO2 99%   BMI 25.50 kg/m  BMI: Body mass index is 25.5 kg/m. Well nourished, well developed, in no acute distress  HEENT: normocephalic, atraumatic  Neck: no JVD, carotid bruits or masses Cardiac:  RRR; no significant murmurs, no rubs, or gallops Lungs:  CTA b/l, no wheezing, rhonchi or rales  Abd: soft, nontender MS: no deformity or atrophy Ext: no edema  Skin: warm and dry, no rash Neuro:  No gross deficits appreciated Psych: euthymic mood, full affect   EKG:  Not done today   02/23/2016:  TTE Study Conclusions  - Left ventricle: The cavity size was normal. Wall thickness was  increased in a pattern of mild LVH. Systolic function was normal.  The estimated ejection fraction was in the range of 50% to 55%.  Wall motion was normal; there were no regional wall motion  abnormalities. Doppler parameters are consistent with abnormal  left ventricular relaxation (grade 1 diastolic dysfunction).  - Aortic valve: Mildly calcified annulus. Moderately thickened,  moderately calcified leaflets.    11/21/2014: stress myoview Impression Exercise Capacity:  Fair exercise capacity. BP Response:  Hypertensive blood pressure response. Clinical Symptoms:  No significant symptoms noted. ECG Impression:  No significant ST segment change suggestive of ischemia. Comparison with Prior Nuclear Study: No previous nuclear study performed  Overall Impression:  Low risk stress nuclear study with a small diaphragmatic attenuation artifact.  LV Ejection Fraction: 63%.  LV Wall Motion:  NL LV Function; NL Wall Motion   Recent Labs: 04/13/2019: Hemoglobin 13.4; Platelets 270 11/06/2019: BUN 13; Creatinine, Ser 0.97; Potassium 4.0; Sodium 140 03/03/2020: ALT 11  03/03/2020: Chol/HDL Ratio 3.2; Cholesterol, Total 203; HDL 64; LDL Chol Calc (NIH) 93; Triglycerides 275   CrCl cannot be calculated (Patient's most recent lab result is older than the maximum 21 days allowed.).   Wt Readings from Last 3 Encounters:  03/06/20 188 lb (85.3 kg)  10/30/19 184 lb (83.5 kg)  09/07/19 183 lb (83 kg)     Other studies reviewed: Additional studies/records reviewed today include: summarized above  ASSESSMENT AND PLAN:  1. Paroxysmal Afib     CHA2DS2Vasc is 4, on Eliquis,  appropriately dosed     Rhythm is regular today     No symptoms of Afib  2. CAD     No anginal symptoms     On ASA, BB, Repatha     C/w Dr. Radford Pax  3. HTN     Looks OK, no changes today  4. HLD     On Repatha and  crestor     Last lipid profile not to goal     follows with lipid clinic, deferred to them and Dr. Radford Pax  5. OSA     Says he has not used his CPAP perhaps in a year or more.  He does not know why, just stopped using it     Encouraged him to resume his CPCP use, and c/w Dr. Radford Pax   Disposition: F/u with Dr. Allred/EP service again ion a year, sooner if needed  Current medicines are reviewed at length with the patient today.  The patient did not have any concerns regarding medicines.  Venetia Night, PA-C 03/06/2020 2:02 PM     [place] [address] [phone] (office)  [phone] (fax)

## 2020-03-06 ENCOUNTER — Ambulatory Visit (INDEPENDENT_AMBULATORY_CARE_PROVIDER_SITE_OTHER): Payer: Medicare Other | Admitting: Physician Assistant

## 2020-03-06 VITALS — BP 124/78 | HR 64 | Ht 72.0 in | Wt 188.0 lb

## 2020-03-06 DIAGNOSIS — E785 Hyperlipidemia, unspecified: Secondary | ICD-10-CM

## 2020-03-06 DIAGNOSIS — I480 Paroxysmal atrial fibrillation: Secondary | ICD-10-CM

## 2020-03-06 DIAGNOSIS — I2510 Atherosclerotic heart disease of native coronary artery without angina pectoris: Secondary | ICD-10-CM

## 2020-03-06 DIAGNOSIS — I10 Essential (primary) hypertension: Secondary | ICD-10-CM | POA: Diagnosis not present

## 2020-03-06 NOTE — Patient Instructions (Signed)
Medication Instructions:   Your physician recommends that you continue on your current medications as directed. Please refer to the Current Medication list given to you today.  *If you need a refill on your cardiac medications before your next appointment, please call your pharmacy*   Lab Work: NONE ORDERED  TODAY   If you have labs (blood work) drawn today and your tests are completely normal, you will receive your results only by: . MyChart Message (if you have MyChart) OR . A paper copy in the mail If you have any lab test that is abnormal or we need to change your treatment, we will call you to review the results.   Testing/Procedures: NONE ORDERED  TODAY   Follow-Up: At [facility], you and your health needs are our priority.  As part of our continuing mission to provide you with exceptional heart care, we have created designated Provider Care Teams.  These Care Teams include your primary Cardiologist (physician) and Advanced Practice Providers (APPs -  Physician Assistants and Nurse Practitioners) who all work together to provide you with the care you need, when you need it.  We recommend signing up for the patient portal called "MyChart".  Sign up information is provided on this After Visit Summary.  MyChart is used to connect with patients for Virtual Visits (Telemedicine).  Patients are able to view lab/test results, encounter notes, upcoming appointments, etc.  Non-urgent messages can be sent to your provider as well.   To learn more about what you can do with MyChart, go to [email or web address].[email or web address].    Your next appointment:   1 year(s)  The format for your next appointment:   In Person  Provider:   You may see James Allred, MD or one of the following Advanced Practice Providers on your designated Care Team:    Amber Seiler, NP  Renee Ursuy, PA-C  Michael "Andy" Tillery, PA-C    Other Instructions   

## 2020-03-10 ENCOUNTER — Encounter: Payer: Self-pay | Admitting: Counselor

## 2020-03-10 ENCOUNTER — Ambulatory Visit (INDEPENDENT_AMBULATORY_CARE_PROVIDER_SITE_OTHER): Payer: Medicare Other | Admitting: Counselor

## 2020-03-10 ENCOUNTER — Ambulatory Visit: Payer: Medicare Other

## 2020-03-10 ENCOUNTER — Other Ambulatory Visit: Payer: Self-pay

## 2020-03-10 DIAGNOSIS — F0390 Unspecified dementia without behavioral disturbance: Secondary | ICD-10-CM

## 2020-03-10 DIAGNOSIS — R413 Other amnesia: Secondary | ICD-10-CM

## 2020-03-10 DIAGNOSIS — F03A0 Unspecified dementia, mild, without behavioral disturbance, psychotic disturbance, mood disturbance, and anxiety: Secondary | ICD-10-CM

## 2020-03-10 DIAGNOSIS — R454 Irritability and anger: Secondary | ICD-10-CM

## 2020-03-10 NOTE — Progress Notes (Signed)
   Psychometrist Note   Cognitive testing was administered to [name] by Lamar Benes, B.S. (Technician) under the supervision of Alphonzo Severance, Psy.D., ABN. [name] was able to tolerate all test procedures. Dr. Nicole Kindred met with the patient as needed to manage any emotional reactions to the testing procedures (if applicable). Rest breaks were offered.    The battery of tests administered was selected by Dr. Nicole Kindred with consideration to the patient's current level of functioning, the nature of his symptoms, emotional and behavioral responses during the interview, level of literacy, observed level of motivation/effort, and the nature of the referral question. This battery was communicated to the psychometrist. Communication between Dr. Nicole Kindred and the psychometrist was ongoing throughout the evaluation and Dr. Nicole Kindred was immediately accessible at all times. Dr. Nicole Kindred provided supervision to the technician on the date of this service, to the extent necessary to assure the quality of all services provided.    [name] will return in approximately one week for an interactive feedback session with Dr. Nicole Kindred, at which time male test performance, clinical impressions, and treatment recommendations will be reviewed in detail. The patient understands he can contact our office should he require our assistance before this time.   A total of 95 minutes of billable time were spent with [name] by the technician, including test administration and scoring time. Billing for these services is reflected in Dr. Les Pou note.   This note reflects time spent with the psychometrician and does not include test scores, clinical history, or any interpretations made by Dr. Nicole Kindred. The full report will follow in a separate note.

## 2020-03-13 ENCOUNTER — Encounter: Payer: Medicare Other | Admitting: Counselor

## 2020-03-13 NOTE — Progress Notes (Signed)
[facility]  Patient Name: [name] MRN: [id number] Date of Birth: Oct 04, 1933 Age: 86 y.o. Education: 16 years  Measurement properties of test scores: IQ, Index, and Standard Scores (SS): Mean = 100; Standard Deviation = 15 Scaled Scores (Ss): Mean = 10; Standard Deviation = 3 Z scores (Z): Mean = 0; Standard Deviation = 1 T scores (T); Mean = 50; Standard Deviation = 10  TEST SCORES:    Note: This summary of test scores accompanies the interpretive report and should not be considered in isolation without reference to the appropriate sections in the text. Test scores are relative to age, gender, and educational history as available and appropriate.   Performance Validity        "A" Random Letter Test Raw  Descriptor      Errors 1 Within Expectation  The Dot Counting Test: 12 Within Expectation      Mental Status Screening     Total Score Descriptor  MoCA 16 Mild Dementia      Expected Functioning        Wide Range Achievement Test: Standard/Scaled Score Percentile      Word Reading 109 73      Attention/Processing Speed        Neuropsychological Assessment Battery (Attention Module, Form 1): T-score Percentile      Digits Forward 55 69      Digits Backwards 46 34      Repeatable Battery for the Assessment of Neuropsychological Status (Form A): Scaled Score Percentile      Coding 6 9      Language        Neuropsychological Assessment Battery (Language Module, Form 1): T-score Percentile      Naming   (27) 38 12      Verbal Fluency: T-score Percentile      Controlled Oral Word Association (F-A-S) 50 50      Semantic Fluency (Animals) 29 2      Memory:        Neuropsychological Assessment Battery (Memory Module, Form 1): T-score Percentile      List Learning           List A Immediate Recall   (3, 3, 4) 25 1         List B Immediate Recall   (0) 23 <1         List A Short Delayed Recall   (1) 25 1         List A Long  Delayed Recall   (0) 27 1         List A Long Delayed Yes/No Recognition Hits   (5) --- <1         List A Long Delayed Yes/No Recognition False Alarms   (2) --- 79         List A Recognition Discriminability Index --- 25     Story Learning           Immediate Recall   (0, 14) 19 <1         Delayed Recall   (0) 30 2      Daily Living Memory            Immediate Recall   (13, 11) 28 2          Delayed Recall   (0, 0) 19 <1          Recognition Hits --- 2      Repeatable Battery  for the Assessment of Neuropsychological Status (Form A): Scaled Score Percentile         Figure Recall   (1) 2 <1      Visuospatial/Constructional Functioning        Repeatable Battery for the Assessment of Neuropsychological Status (Form A): Standard/Scaled Score Percentile     Visuospatial/Constructional Index 84 14         Figure Copy   (15) 6 9         Judgment of Line Orientation   (15) --- 26-50      Executive Functioning        [employer] Test (MWCST): Standard/T-Score Percentile      Number of Categories Correct 30 3      Number of Perseverative Errors 33 5      Number of Total Errors 32 4      Percent Perseverative Errors 44 28  Executive Function Composite 69 2      Trail Making Test: T-Score Percentile      Part A 37 9      Part B 34 5      [employer] Diagnostic Aphasia Exam: Raw Score Scaled Score      Complex Ideational Material 11 9      Clock Drawing Raw Score Descriptor      Command 7 Mild Impairment      Rating Scales         Raw Score Descriptor  Quick Dementia Rating System        Sum of Boxes 8 Mild Dementia      Total Score 13 Moderate Dementia  Geriatric Depression Scale - Short Form 7 Depressed   Peter V. Nicole Kindred PsyD, [place] Clinical Neuropsychologist

## 2020-03-14 ENCOUNTER — Other Ambulatory Visit: Payer: Self-pay

## 2020-03-14 ENCOUNTER — Encounter (HOSPITAL_COMMUNITY): Payer: Self-pay | Admitting: Emergency Medicine

## 2020-03-14 ENCOUNTER — Emergency Department (HOSPITAL_COMMUNITY): Payer: Medicare Other

## 2020-03-14 ENCOUNTER — Emergency Department (HOSPITAL_COMMUNITY)
Admission: EM | Admit: 2020-03-14 | Discharge: 2020-03-14 | Disposition: A | Discharge status: Home / Self Care | Payer: Medicare Other | Attending: Emergency Medicine | Admitting: Emergency Medicine

## 2020-03-14 DIAGNOSIS — R0789 Other chest pain: Secondary | ICD-10-CM | POA: Insufficient documentation

## 2020-03-14 DIAGNOSIS — R1013 Epigastric pain: Secondary | ICD-10-CM | POA: Diagnosis not present

## 2020-03-14 DIAGNOSIS — I10 Essential (primary) hypertension: Secondary | ICD-10-CM | POA: Insufficient documentation

## 2020-03-14 DIAGNOSIS — I2510 Atherosclerotic heart disease of native coronary artery without angina pectoris: Secondary | ICD-10-CM | POA: Diagnosis not present

## 2020-03-14 DIAGNOSIS — Z79899 Other long term (current) drug therapy: Secondary | ICD-10-CM | POA: Insufficient documentation

## 2020-03-14 DIAGNOSIS — I4891 Unspecified atrial fibrillation: Secondary | ICD-10-CM | POA: Diagnosis not present

## 2020-03-14 DIAGNOSIS — Z7982 Long term (current) use of aspirin: Secondary | ICD-10-CM | POA: Insufficient documentation

## 2020-03-14 DIAGNOSIS — R079 Chest pain, unspecified: Secondary | ICD-10-CM | POA: Diagnosis not present

## 2020-03-14 DIAGNOSIS — Z87891 Personal history of nicotine dependence: Secondary | ICD-10-CM | POA: Diagnosis not present

## 2020-03-14 LAB — BASIC METABOLIC PANEL
Anion gap: 10 (ref 5–15)
BUN: 15 mg/dL (ref 8–23)
CO2: 26 mmol/L (ref 22–32)
Calcium: 9.4 mg/dL (ref 8.9–10.3)
Chloride: 105 mmol/L (ref 98–111)
Creatinine, Ser: 0.93 mg/dL (ref 0.61–1.24)
GFR calc Af Amer: >60 mL/min (ref >60)
GFR calc non Af Amer: >60 mL/min (ref >60)
Glucose, Bld: 115 mg/dL — ABNORMAL HIGH (ref 70–99)
Potassium: 3.7 mmol/L (ref 3.5–5.1)
Sodium: 141 mmol/L (ref 135–145)

## 2020-03-14 LAB — CBC
HCT: 42.3 % (ref 39.0–52.0)
Hemoglobin: 13.8 g/dL (ref 13.0–17.0)
MCH: 32.1 pg (ref 26.0–34.0)
MCHC: 32.6 g/dL (ref 30.0–36.0)
MCV: 98.4 fL (ref 80.0–100.0)
Platelets: 299 10*3/uL (ref 150–400)
RBC: 4.3 MIL/uL (ref 4.22–5.81)
RDW: 14 % (ref 11.5–15.5)
WBC: 8.6 10*3/uL (ref 4.0–10.5)
nRBC: 0 % (ref 0.0–0.2)

## 2020-03-14 LAB — TROPONIN I (HIGH SENSITIVITY)
Troponin I (High Sensitivity): 6 ng/L (ref <18)
Troponin I (High Sensitivity): 4 ng/L (ref <18)

## 2020-03-14 MED ORDER — SODIUM CHLORIDE 0.9% FLUSH: 3.0000 mL | Freq: Once | INTRAVENOUS | Status: DC

## 2020-03-14 NOTE — ED Triage Notes (Signed)
Pt reports central chest pain that began this morning, also endorses hx of the same. Pt denies sob. Reports he has not taken anything for his pain, resp e/u, nad.

## 2020-03-14 NOTE — ED Provider Notes (Signed)
Cardiology has seen patient and recommended discharge.  Patient understands return precautions and need for cardiology follow-up.   Sherwood Gambler, MD 03/14/20 [phone]

## 2020-03-14 NOTE — ED Provider Notes (Signed)
[facility] EMERGENCY DEPARTMENT Provider Note   CSN: [id number] Arrival date & time: 03/14/20  1042     History Chief Complaint  Patient presents with  . Chest Pain    [name] is a 86 y.o. male.  HPI     86 year old male history of coronary artery disease status post inferior STEMI with total RCA February 2015, presents today complaining of chest pain that began this morning approximately 8 AM and lasted 1 hour.  It has now completely resolved.  Wife states he has associated dyspnea.  They deny any nausea or diaphoresis.  This is similar to previous pain.  Describes it as 5-6 out of 10.  Has no history of coronary artery disease as well as hypertension, hyperlipidemia.  Past Medical History:  Diagnosis Date  . CAD (coronary artery disease)    a. s/p inferior STEMI 01/08/2014; LHC 01/08/14: total RCA occlusion s/p 3.5x38mm Xience DES distal RCA and 3.5x28 mm DES mid RCA, 60-70% mid LAD stenosis, EF 55%.  . Complication of anesthesia    'long to wake up after back surgery " 09/2003  . Diverticulosis of colon   . GERD (gastroesophageal reflux disease)   . History of cellulitis    05-26-2015  LLE  . History of colon polyps    1998- benign/  2008 adenomatous   . History of kidney stones    2013  . History of squamous cell carcinoma excision    2013;  2015;  06-12-2015 right leg/  02/ and 05/ 2017  left ear and left leg  . Hydronephrosis, left   . Hyperlipidemia   . Hypertension   . Migraine with aura   . OSA on CPAP 06/19/2015   Moderate OSA with AHI 18/hr  per study 05-20-2015  . Osteoarthritis   . Paroxysmal atrial fibrillation ([place]) 4/16   chads2vasc score is at least 4  . Premature atrial contractions   . RA (rheumatoid arthritis) [facility])    rheumatologist-  dr Leigh Aurora  . Sinusitis, chronic 10/17/2015  . Wears glasses   . Wears hearing aid    bilateral    Patient Active Problem List   Diagnosis Date Noted  . Encounter for medication  management 11/14/2019  . Angular stomatitis 07/09/2019  . Cervical radiculopathy 06/05/2019  . Pain in joint of left shoulder 05/31/2019  . Concussion 04/17/2019  . Hemoptysis 04/05/2019  . Confusion 04/05/2019  . Memory loss 12/21/2018  . Nausea & vomiting 02/07/2018  . Chills 02/07/2018  . Rash 02/07/2018  . Insomnia 09/22/2017  . Cervical spondylolysis 06/28/2017  . Hyperglycemia 06/22/2017  . Well adult exam 11/18/2016  . Hydronephrosis 11/18/2016  . UPJ obstruction, 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  . Allergic rhinitis 06/02/2016  . Asthma with exacerbation 06/02/2016  . Sinusitis, chronic 10/17/2015  . OSA (obstructive sleep apnea) 06/19/2015  . Cellulitis and abscess of leg, except foot 05/26/2015  . Paroxysmal atrial fibrillation ([place]) 02/21/2015  . CAP (community acquired pneumonia) 01/02/2015  . Upper respiratory infection 12/02/2014  . Vivid dream 09/25/2014  . Actinic keratoses 04/26/2014  . CAD (coronary artery disease) 02/25/2014  . STEMI (ST elevation myocardial infarction) ([place]) 01/08/2014  . Rheumatoid bursitis of left elbow ([place]) 08/08/2013  . Situational depression 06/18/2013  . Neck pain 04/23/2012  . Acute abdominal pain in left flank 02/14/2012  . Hypertension 09/23/2011  . Chest pain, midsternal 09/13/2011  . Mixed hyperlipidemia 07/23/2010  . Palpitations 07/07/2010  . Rheumatoid arthritis ([place]) 06/04/2010  . PARESTHESIA 06/04/2010  .  ARTHRALGIA 11/27/2009  . MYALGIA 08/21/2009  . ALLERGIC RHINITIS 06/11/2008  . OSTEOARTHRITIS 06/11/2008  . FATIGUE 06/11/2008  . LOW BACK PAIN 07/27/2007  . COLONIC POLYPS, HX OF 07/27/2007    Past Surgical History:  Procedure Laterality Date  . BACK SURGERY    . CARDIOVASCULAR STRESS TEST  11/21/2015   Low risk nuclear study w/ a small diaphragmatic attenuation artifact, no ischemia/  normal LV function and wall motion , ef 63%  . CATARACT EXTRACTION W/ INTRAOCULAR LENS  IMPLANT, BILATERAL  2015  . COLONOSCOPY  last  one 06-09-2011  . CORONARY ANGIOPLASTY    . CYSTOSCOPY W/ RETROGRADES Left 07/12/2016   Procedure: CYSTOSCOPY WITH RETROGRADE PYELOGRAM LEFT URETERAL STENT;  Surgeon: Irine Seal, MD;  Location: [facility];  Service: Urology;  Laterality: Left;  . CYSTOSCOPY WITH RETROGRADE PYELOGRAM, URETEROSCOPY AND STENT PLACEMENT Left 08/11/2016   Procedure: CYSTOSCOPY WITH RETROGRADE PYELOGRAM,  DIAGNOSTIC URETEROSCOPY , STENT EXCHANGE;  Surgeon: Alexis Frock, MD;  [facility]: [facility];  Service: Urology;  Laterality: Left;  . 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   severe fibromatosis palm and fingers  . LEFT HEART CATHETERIZATION WITH CORONARY ANGIOGRAM N/A 01/08/2014   Procedure: LEFT HEART CATHETERIZATION WITH CORONARY ANGIOGRAM;  Surgeon: Troy Sine, MD;  Location: [facility] CATH LAB;  Service: Cardiovascular;  Laterality:N/A;  total/ subtotal RCA/  mLAD [id number] w/ mid systolic bridging/  preserved global LVF, ef 55%  . MOHS SURGERY  x2  feb and may 2017   left ear /  left leg  ([facility])  . PERCUTANEOUS CORONARY STENT INTERVENTION (PCI-S)  01/08/2014   Procedure: PERCUTANEOUS CORONARY STENT INTERVENTION (PCI-S);  Surgeon: Troy Sine, MD;  Location: [facility] CATH LAB;  Service: Cardiovascular;;  DES to mid and distal RCA  . POSTERIOR LUMBAR FUSION  10/01/2003   and Laminectomy/ diskectomy  L4 -- S1  . ROBOT ASSISTED PYELOPLASTY Left 09/15/2016   Procedure: XI ROBOTIC ASSISTED PYELOPLASTY;  Surgeon: Alexis Frock, MD;  Location: [facility];  Service: Urology;  Laterality: Left;  . TONSILLECTOMY    . TRANSTHORACIC ECHOCARDIOGRAM  02/23/2016   mild LVH,  ef 50-55%,  grade 1 diastolic dysfunction/  mild to moderate AV calcification w/ no stenosis or regurg./  trivial MR and TR/  mild PR       Family History  Problem Relation Age of Onset  . Hypertension Mother   . Heart disease Father   . Colon cancer Neg Hx     Social History   Tobacco Use  . Smoking status: Former Smoker    Years: 10.00     Types: Cigarettes    Quit date: 08/09/1968    Years since quitting: 51.6  . Smokeless tobacco: Never Used  Substance Use Topics  . Alcohol use: Yes    Alcohol/week: 2.0 standard drinks    Types: 2 Shots of liquor per week    Comment: daily  . Drug use: No    Home Medications Prior to Admission medications   Medication Sig Start Date End Date Taking? Authorizing Provider  acetaminophen (TYLENOL) 500 MG tablet Take 1,000 mg by mouth every 6 (six) hours as needed.    [provider]  acidophilus (RISAQUAD) CAPS capsule Take 1 capsule by mouth daily.    [provider]  aspirin EC 81 MG tablet Take 81 mg by mouth daily.    [provider]  donepezil (ARICEPT) 10 MG tablet Take 5 mg by mouth at bedtime.    [provider]  ELIQUIS 5 MG TABS tablet TAKE ONE TABLET TWICE DAILY 12/31/19   Sueanne Margarita, MD  escitalopram (LEXAPRO) 20 MG tablet Take 1 tablet (20 mg total) by mouth daily. 05/24/19   Plotnikov, Evie Lacks, MD  ezetimibe (ZETIA) 10 MG tablet TAKE ONE-HALF TABLET DAILY 04/03/19   Plotnikov, Evie Lacks, MD  lisinopril (ZESTRIL) 10 MG tablet Take 1 tablet (10 mg total) by mouth daily. 10/30/19   Sueanne Margarita, MD  memantine (NAMENDA) 10 MG tablet Take 1 tablet at night for 2 weeks, then increase to 1 tablet twice a day 01/04/20   Cameron Sprang, MD  metoprolol tartrate (LOPRESSOR) 25 MG tablet TAKE ONE TABLET TWICE DAILY 02/21/20   Sueanne Margarita, MD  nitroGLYCERIN (NITROSTAT) 0.4 MG SL tablet ONE TABLET UNDER TONGUE AS NEEDED FOR CHEST PAIN. MAY REPEAT IN 5 MINUTES AND AGAIN IN 5 MINUTES (TOTAL OF 3 TABLETS) 03/10/18   Allred, Jeneen Rinks, MD  omeprazole (PRILOSEC) 40 MG capsule TAKE ONE CAPSULE EACH DAY 12/03/19   Plotnikov, Evie Lacks, MD  predniSONE (DELTASONE) 10 MG tablet Take 20 mg every morning for 2 weeks, then 50 mg every morning for 2 weeks, then 10 mg every morning.  Take with food. 01/18/20   Plotnikov, Evie Lacks, MD  Propylene Glycol (SYSTANE  BALANCE OP) Place 2 drops into both eyes daily as needed (dry eyes).     [provider]  REPATHA SURECLICK [id number] MG/ML SOAJ INJECT 1 PEN INTO SKIN EVERY 14 DAYS 12/31/19   Sueanne Margarita, MD  rosuvastatin (CRESTOR) 5 MG tablet Take 1 tablet (5 mg total) by mouth daily. 02/12/20   Allred, Jeneen Rinks, MD  zolpidem (AMBIEN) 10 MG tablet TAKE 1/2 TO 1 TABLET AT BEDTIME AS NEEDED FOR SLEEP 11/25/19   Plotnikov, Evie Lacks, MD    Allergies    Methotrexate, Aricept Reather Littler hcl], Crestor [rosuvastatin calcium], Lisinopril, Atorvastatin, and Pravastatin sodium  Review of Systems   Review of Systems  All other systems reviewed and are negative.   Physical Exam Updated Vital Signs BP (!) 173/106 (BP Location: Right Arm)   Pulse (!) 58   Temp 97.7 F (36.5 C) (Oral)   Resp 16   SpO2 99%   Physical Exam Vitals and nursing note reviewed.  Constitutional:      General: He is not in acute distress.    Appearance: He is well-developed and normal weight. He is not ill-appearing.  HENT:     Head: Normocephalic.  Eyes:     Pupils: Pupils are equal, round, and reactive to light.  Cardiovascular:     Rate and Rhythm: Regular rhythm. Bradycardia present.     Heart sounds: Normal heart sounds.  Pulmonary:     Effort: Pulmonary effort is normal.     Breath sounds: Normal breath sounds.  Abdominal:     General: Bowel sounds are normal. There is no abdominal bruit.     Palpations: Abdomen is soft. There is no hepatomegaly or mass.     Tenderness: There is no abdominal tenderness.  Musculoskeletal:        General: Normal range of motion.     Cervical back: Normal range of motion.     Right lower leg: No tenderness. No edema.     Left lower leg: No tenderness. No edema.  Skin:    General: Skin is warm and dry.     Capillary Refill: Capillary refill takes less than 2 seconds.  Neurological:  General: No focal deficit present.     Mental Status: He is alert.  Psychiatric:        Mood and  Affect: Mood normal.     ED Results / Procedures / Treatments   Labs (all labs ordered are listed, but only abnormal results are displayed) Labs Reviewed  BASIC METABOLIC PANEL - Abnormal; Notable for the following components:      Result Value   Glucose, Bld 115 (*)    All other components within normal limits  CBC  TROPONIN I (HIGH SENSITIVITY)    EKG EKG Interpretation  Date/Time:  Friday March 14 2020 10:48:38 EDT Ventricular Rate:  59 PR Interval:  166 QRS Duration: 86 QT Interval:  412 QTC Calculation: 407 R Axis:   -7 Text Interpretation: Sinus bradycardia Possible Inferior infarct , age undetermined Abnormal ECG Confirmed by Pattricia Boss [phone]) on 03/14/2020 12:26:16 PM   Radiology DG Chest 2 View  Result Date: 03/14/2020 CLINICAL DATA:  Central chest pain EXAM: CHEST - 2 VIEW COMPARISON:  05/13/2016 FINDINGS: The heart size and mediastinal contours are within normal limits. Both lungs are clear. Disc degenerative disease of the thoracic spine. IMPRESSION: No acute abnormality of the lungs. Electronically Signed   By: Eddie Candle M.D.   On: 03/14/2020 11:12    Procedures Procedures (including critical care time)  Medications Ordered in ED Medications  sodium chloride flush (NS) 0.9 % injection 3 mL (has no administration in time range)    ED Course  I have reviewed the triage vital signs and the nursing notes.  Pertinent labs & imaging results that were available during my care of the patient were reviewed by me and considered in my medical decision making (see chart for details).  Clinical Course as of Mar 14 1442  Fri Mar 14, 2020  1228 Glucose noted remainder of the mat reviewedCBC reviewed  Glucose(!): 115 [DR]  1228 Chest x-Alysiah Suppa and EKG reviewedChest x-Oneisha Ammons radiology interpretation reviewed   [DR]  1442 Labs, imaging, and EKGs reviewed.   [DR]    Clinical Course User Index [DR] Pattricia Boss, MD   MDM Rules/Calculators/A&P                       86 year old male history of coronary disease presents today with chest pain which has since resolved.  EKG without any acute ischemic changes.  Initial troponin is 6.  Heart score is 6.  Patient will need repeat troponin.  Cardiology is consulted. 2:42 PM Patient reevaluated continues pain-free.  His repeat troponin is normal.  Awaiting cardiology consult. If cardiology clears, may be discharged  Final Clinical Impression(s) / ED Diagnoses Final diagnoses:  Chest pain, unspecified type    Rx / DC Orders ED Discharge Orders    None       Pattricia Boss, MD 03/14/20 1443

## 2020-03-14 NOTE — ED Notes (Signed)
Patient voiced understanding of discharge instructions. Opportunity for questions provided.

## 2020-03-14 NOTE — Discharge Instructions (Signed)
If you develop recurrent, continued, or worsening chest pain, shortness of breath, fever, vomiting, abdominal or back pain, or any other new/concerning symptoms then return to the ER for evaluation.  

## 2020-03-14 NOTE — Consult Note (Addendum)
Cardiology Admission History and Physical:   Patient ID: [name] MRN: [id number]; DOB: [birth year]   Admission date: 03/14/2020  Primary Care Provider: Cassandria Anger, MD Primary Cardiologist: Fransico Him, MD  Primary Electrophysiologist:  Thompson Grayer, MD   Chief Complaint:  Chest pain  Patient Profile:   SOK DOLTON is a 86 y.o. male with pmh of CAD (inferior STEMI 12/2013 with total RCA occlusion s/p DESx2, residual 60-70%mLAD), Afib (dx 2016), OSA noncompliant CPAP, GERD, HTN, HLD (Statin intolerant) on Repatha followed by lipid clinic and RA who is being seen for chest pain.  History of Present Illness:   Mr. Hodo is followed by Dr. Radford Pax for the above cardiac issues. Last cath was in 2015 with STEMI, total/subtotal RCA occlusion treated with DESx2 with residual 60-70%mLAD. He had a Exercise stress test Myoview in 2016 that was low risk, EF 63%. Echo in 2017 showed EF 50-55%, G1DD, normal WM. Patient follows with Dr. Rayann Heman for Afib. He is on Eliquis for CHADSVASC 4. Last seen by EP 03/06/20 and denied recurrent Afib. Encouraged use of CPAP. He follows with the lipid clinic on repatha and crestor. He is not compliant with CPAP.   The patient presented to the ED 03/14/20 for chest pain. This morning the patient and his wife went out to work in the yard when he started to have chest pain. The patient thought it was acid-reflux at first and didn't think much of it. It was substernal and radiated up into the jaw. It was a constant pressure about 6/10. Denied associated sob, N/V, diaphoresis.  When it didn't go away he went inside and laid down. He took his BP which was up, systolics in the [id number] despite taking his medications that morning. The pain was similar to his prior MI in 2015. A couple minutes later he came out and asked his wife to go the ER. Patient did not take anything for the pain. Pain lasted about an hour before resolving. Denies recent fever, chills.   In the ED BP  173/106, pulse 58, afebrile, 99% O2. Labs show potassium 3.7, glucose 115, creatinine 0.93, normal LFTsm WBC 8.6, Hgb 13.8. HS troponin 6>4.  CXR unremarkable. EKG shows sinus brady, 59 bpm with TWI III. Cardiology was consulted for possible admission.  The patient is currently chest pain free. He is wanting to go home. Patient regularly walks the dog and has not CP or sob in the recent weeks. Denies tobacco/drug use. He drinks 1 glass of wine daily.      Past Medical History:  Diagnosis Date   CAD (coronary artery disease)    a. s/p inferior STEMI 01/08/2014; LHC 01/08/14: total RCA occlusion s/p 3.5x104mm Xience DES distal RCA and 3.5x28 mm DES mid RCA, 60-70% mid LAD stenosis, EF 55%.   Complication of anesthesia    'long to wake up after back surgery " 09/2003   Diverticulosis of colon    GERD (gastroesophageal reflux disease)    History of cellulitis    05-26-2015  LLE   History of colon polyps    1998- benign/  2008 adenomatous    History of kidney stones    2013   History of squamous cell carcinoma excision    2013;  2015;  06-12-2015 right leg/  02/ and 05/ 2017  left ear and left leg   Hydronephrosis, left    Hyperlipidemia    Hypertension    Migraine with aura    OSA on CPAP 06/19/2015  Moderate OSA with AHI 18/hr  per study 05-20-2015   Osteoarthritis    Paroxysmal atrial fibrillation ([place]) 4/16   chads2vasc score is at least 4   Premature atrial contractions    RA (rheumatoid arthritis) [facility])    rheumatologist-  dr Leigh Aurora   Sinusitis, chronic 10/17/2015   Wears glasses    Wears hearing aid    bilateral    Past Surgical History:  Procedure Laterality Date   BACK SURGERY     CARDIOVASCULAR STRESS TEST  11/21/2015   Low risk nuclear study w/ a small diaphragmatic attenuation artifact, no ischemia/  normal LV function and wall motion , ef 63%   CATARACT EXTRACTION W/ INTRAOCULAR LENS  IMPLANT, BILATERAL  2015   COLONOSCOPY  last one 06-09-2011   CORONARY  ANGIOPLASTY     CYSTOSCOPY W/ RETROGRADES Left 07/12/2016   Procedure: CYSTOSCOPY WITH RETROGRADE PYELOGRAM LEFT URETERAL STENT;  Surgeon: Irine Seal, MD;  Location: [facility];  Service: Urology;  Laterality: Left;   CYSTOSCOPY WITH RETROGRADE PYELOGRAM, URETEROSCOPY AND STENT PLACEMENT Left 08/11/2016   Procedure: CYSTOSCOPY WITH RETROGRADE PYELOGRAM,  DIAGNOSTIC URETEROSCOPY , STENT EXCHANGE;  Surgeon: Alexis Frock, MD;  Location: [facility];  Service: Urology;  Laterality: Left;   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   severe fibromatosis palm and fingers   LEFT HEART CATHETERIZATION WITH CORONARY ANGIOGRAM N/A 01/08/2014   Procedure: LEFT HEART CATHETERIZATION WITH CORONARY ANGIOGRAM;  Surgeon: Troy Sine, MD;  Location: [facility] CATH LAB;  Service: Cardiovascular;  Laterality:N/A;  total/ subtotal RCA/  mLAD [id number] w/ mid systolic bridging/  preserved global LVF, ef 55%   MOHS SURGERY  x2  feb and may 2017   left ear /  left leg  ([facility])   PERCUTANEOUS CORONARY STENT INTERVENTION (PCI-S)  01/08/2014   Procedure: PERCUTANEOUS CORONARY STENT INTERVENTION (PCI-S);  Surgeon: Troy Sine, MD;  Location: [facility] CATH LAB;  Service: Cardiovascular;;  DES to mid and distal RCA   POSTERIOR LUMBAR FUSION  10/01/2003   and Laminectomy/ diskectomy  L4 -- S1   ROBOT ASSISTED PYELOPLASTY Left 09/15/2016   Procedure: XI ROBOTIC ASSISTED PYELOPLASTY;  Surgeon: Alexis Frock, MD;  Location: [facility];  Service: Urology;  Laterality: Left;   TONSILLECTOMY     TRANSTHORACIC ECHOCARDIOGRAM  02/23/2016   mild LVH,  ef 50-55%,  grade 1 diastolic dysfunction/  mild to moderate AV calcification w/ no stenosis or regurg./  trivial MR and TR/  mild PR     Medications Prior to Admission: Prior to Admission medications   Medication Sig Start Date End Date Taking? Authorizing Provider  acetaminophen (TYLENOL) 500 MG tablet Take 1,000 mg by mouth every 6 (six) hours as needed.    [provider]   acidophilus (RISAQUAD) CAPS capsule Take 1 capsule by mouth daily.    [provider]  aspirin EC 81 MG tablet Take 81 mg by mouth daily.    [provider]  donepezil (ARICEPT) 10 MG tablet Take 5 mg by mouth at bedtime.    [provider]  ELIQUIS 5 MG TABS tablet TAKE ONE TABLET TWICE DAILY 12/31/19   Sueanne Margarita, MD  escitalopram (LEXAPRO) 20 MG tablet Take 1 tablet (20 mg total) by mouth daily. 05/24/19   Plotnikov, Evie Lacks, MD  ezetimibe (ZETIA) 10 MG tablet TAKE ONE-HALF TABLET DAILY 04/03/19   Plotnikov, Evie Lacks, MD  lisinopril (ZESTRIL) 10 MG tablet Take 1 tablet (10 mg total) by mouth daily. 10/30/19  Sueanne Margarita, MD  memantine (NAMENDA) 10 MG tablet Take 1 tablet at night for 2 weeks, then increase to 1 tablet twice a day 01/04/20   Cameron Sprang, MD  metoprolol tartrate (LOPRESSOR) 25 MG tablet TAKE ONE TABLET TWICE DAILY 02/21/20   Sueanne Margarita, MD  nitroGLYCERIN (NITROSTAT) 0.4 MG SL tablet ONE TABLET UNDER TONGUE AS NEEDED FOR CHEST PAIN. MAY REPEAT IN 5 MINUTES AND AGAIN IN 5 MINUTES (TOTAL OF 3 TABLETS) 03/10/18   Allred, Jeneen Rinks, MD  omeprazole (PRILOSEC) 40 MG capsule TAKE ONE CAPSULE EACH DAY 12/03/19   Plotnikov, Evie Lacks, MD  predniSONE (DELTASONE) 10 MG tablet Take 20 mg every morning for 2 weeks, then 50 mg every morning for 2 weeks, then 10 mg every morning.  Take with food. 01/18/20   Plotnikov, Evie Lacks, MD  Propylene Glycol (SYSTANE BALANCE OP) Place 2 drops into both eyes daily as needed (dry eyes).     [provider]  REPATHA SURECLICK [id number] MG/ML SOAJ INJECT 1 PEN INTO SKIN EVERY 14 DAYS 12/31/19   Sueanne Margarita, MD  rosuvastatin (CRESTOR) 5 MG tablet Take 1 tablet (5 mg total) by mouth daily. 02/12/20   Allred, Jeneen Rinks, MD  zolpidem (AMBIEN) 10 MG tablet TAKE 1/2 TO 1 TABLET AT BEDTIME AS NEEDED FOR SLEEP 11/25/19   Plotnikov, Evie Lacks, MD     Allergies:    Allergies  Allergen Reactions   Methotrexate Other (See Comments)      REACTION: tachycardia   Aricept [Donepezil Hcl]     nightmares    Crestor [Rosuvastatin Calcium]     Myalgias with 20mg  dose   Lisinopril Other (See Comments)    cough   Atorvastatin Other (See Comments)    Muscle pain, leg cramps   Pravastatin Sodium Other (See Comments)    REACTION: cramps, fatigue    Social History:   Social History   Socioeconomic History   Marital status: Married    Spouse name: Not on file   Number of children: 2   Years of education: Not on file   Highest education level: Not on file  Occupational History   Occupation: Retired    Fish farm manager: RETIRED  Tobacco Use   Smoking status: Former Smoker    Years: 10.00    Types: Cigarettes    Quit date: 08/09/1968    Years since quitting: 51.6   Smokeless tobacco: Never Used  Substance and Sexual Activity   Alcohol use: Yes    Alcohol/week: 2.0 standard drinks    Types: 2 Shots of liquor per week    Comment: daily   Drug use: No   Sexual activity: Yes    Partners: Female  Other Topics Concern   Not on file  Social History Narrative   1 Caffeine drink daily    Right handed       Lives with wife      Social Determinants of Health   Financial Resource Strain:    Difficulty of Paying Living Expenses:   Food Insecurity:    Worried About Charity fundraiser in the Last Year:    Arboriculturist in the Last Year:   Transportation Needs:    Film/video editor (Medical):    Lack of Transportation (Non-Medical):   Physical Activity:    Days of Exercise per Week:    Minutes of Exercise per Session:   Stress:    Feeling of Stress :   Social Connections:  Frequency of Communication with Friends and Family:    Frequency of Social Gatherings with Friends and Family:    Attends Religious Services:    Active Member of Clubs or Organizations:    Attends Music therapist:    Marital Status:   Intimate Partner Violence:    Fear of Current or Ex-Partner:    Emotionally Abused:     Physically Abused:    Sexually Abused:     Family History:   The patient's family history includes Heart disease in his father; Hypertension in his mother. There is no history of Colon cancer.    ROS:  Please see the history of present illness.  All other ROS reviewed and negative.     Physical Exam/Data:   Vitals:   03/14/20 1046 03/14/20 1215 03/14/20 1223 03/14/20 1230  BP: (!) 173/106 (!) 149/88  (!) 159/83  Pulse: (!) 58 (!) 58  (!) 57  Resp: 16 15  15   Temp: 97.7 F (36.5 C)     TempSrc: Oral     SpO2: 99% 95%  95%  Weight:   85 kg   Height:   6' (1.829 m)    No intake or output data in the 24 hours ending 03/14/20 1418 Last 3 Weights 03/14/2020 03/06/2020 10/30/2019  Weight (lbs) 187 lb 6.3 oz 188 lb 184 lb  Weight (kg) 85 kg 85.276 kg 83.462 kg     Body mass index is 25.41 kg/m.  General:  Well nourished, well developed, in no acute distre HEENT: normal Lymph: no adenopathy Neck: no JVD Endocrine:  No thryomegaly Vascular: No carotid bruits; FA pulses 2+ bilaterally without bruits  Cardiac:  normal S1, S2; RRR; no murmur  Lungs:  clear to auscultation bilaterally, no wheezing, rhonchi or rales  Abd: soft, nontender, no hepatomegaly  Ext: no edema Musculoskeletal:  No deformities, BUE and BLE strength normal and equal Skin: warm and dry  Neuro:  CNs 2-12 intact, no focal abnormalities noted Psych:  Normal affect    EKG:  The ECG that was done 03/14/20 was personally reviewed and demonstrates sinus bradycardia, 59 bpm, TWI III  Relevant CV Studies:  Echo 2017 Study Conclusions   - Left ventricle: The cavity size was normal. Wall thickness was    increased in a pattern of mild LVH. Systolic function was normal.    The estimated ejection fraction was in the range of 50% to 55%.    Wall motion was normal; there were no regional wall motion    abnormalities. Doppler parameters are consistent with abnormal    left ventricular relaxation (grade 1 diastolic  dysfunction).  - Aortic valve: Mildly calcified annulus. Moderately thickened,    moderately calcified leaflets.   Stress test Myoview 2016 Exercise Capacity:  Fair exercise capacity. BP Response:  Hypertensive blood pressure response. Clinical Symptoms:  No significant symptoms noted. ECG Impression:  No significant ST segment change suggestive of ischemia. Comparison with Prior Nuclear Study: No previous nuclear study performed  Overall Impression:  Low risk stress nuclear study with a small diaphragmatic attenuation artifact.   LV Ejection Fraction: 63%.  LV Wall Motion:  NL LV Function; NL Wall Motion    Cardiac Cath 2015 1. Left main: Very short and immediately bifurcated into an LAD and left circumflex coronary artery 2. LAD: Moderate size vessel that had 60-70% stenosis in the midsegment with midsystolic bridging component 3. Left circumflex: Angiographically normal vessel which gave rise to 2 major marginal branches.  4.  Right coronary artery: Large dominant right coronary artery that had diffuse 70% mid stenosis and then was subtotally/totally occluded beyond the acute margin the distal RCA with initial TIMI one half flow.   Following percutaneous coronary intervention with PTCA and stenting the 99/100% distal RCA stenosis was reduced to 0% with ultimate insertion of a 3.0x23 mm Xience Alpine stent postdilated to 3.25 mm, and the diffuse 70% mid RCA stenosis was reduced to 0% with the 3.5x28 mm Xience Alpine  Stent postdilated to 3.6 mm. The RCA was a large vessel that gave rise to a large PDA, a large PLA, and several inferolateral branches.    Left ventriculography revealed preserved global the function without apparent wall motion abnormality. Ejection fraction was approximately 55%.   IMPRESSION: Acute inferior wall ST segment elevation myocardial infarction secondary to total/subtotal RCA occlusion in a large dominant RCA   60 - 70% mid LAD stenosis with mid systolic  systolic bridging   Normal left circumflex coronary artery   Diffuse 70% mid RCA stenosis and 99/100% distal RCA stenosis with initial TIMI one half flow   Successful acute intervention to the distal RCA with the subtotal stenosis being reduced to 0% and restoration of TIMI 3 flow with insertion of a 3.0x23 mm Xience Alpine DES stent postdilated to 3.25 mm, and insertion of a 3.5x28 mm Xience Alpine DES stent post dilated to 3.6 mm into the mid RCA with the 70% stenosis reduced to 0% Laboratory Data:  High Sensitivity Troponin:   Recent Labs  Lab 03/14/20 1056  TROPONINIHS 6      Chemistry Recent Labs  Lab 03/14/20 1056  NA 141  K 3.7  CL 105  CO2 26  GLUCOSE 115*  BUN 15  CREATININE 0.93  CALCIUM 9.4  GFRNONAA >60  GFRAA >60  ANIONGAP 10    No results for input(s): PROT, ALBUMIN, AST, ALT, ALKPHOS, BILITOT in the last 168 hours. Hematology Recent Labs  Lab 03/14/20 1056  WBC 8.6  RBC 4.30  HGB 13.8  HCT 42.3  MCV 98.4  MCH 32.1  MCHC 32.6  RDW 14.0  PLT 299   BNPNo results for input(s): BNP, PROBNP in the last 168 hours.  DDimer No results for input(s): DDIMER in the last 168 hours.   Radiology/Studies:  DG Chest 2 View  Result Date: 03/14/2020 CLINICAL DATA:  Central chest pain EXAM: CHEST - 2 VIEW COMPARISON:  05/13/2016 FINDINGS: The heart size and mediastinal contours are within normal limits. Both lungs are clear. Disc degenerative disease of the thoracic spine. IMPRESSION: No acute abnormality of the lungs. Electronically Signed   By: Eddie Candle M.D.   On: 03/14/2020 11:12   {  HEAR Score (for undifferentiated chest pain):  HEAR Score: 6    Assessment and Plan:   Chest pain/CAD s/p DESx2 RCA in 2015 - presents with CP on exertion reminiscent of prior MI - HS trop 6>4 - EKG with no acute changes - Myoview stress test in 2016 was low risk with EF 63% - Echo in 2017 EF 50-55% with no WMA -  Continue statin, repatha, aspirin, BB - Given  negative troponin and stable EKG can discharge. Will arrange follow-up with Dr. Radford Pax and can consider OP stress test  Afib - follows with EP as outpatient - Eliquis for a/c - In sinus rhythm   HLD - follows with lipid clinic - Repatha and crestor - LDL 93  HTN - lisinopril 10 mg daily, Lopressor 25 mg BID -  high on admission - most recent 159/83  OSA - noncompliant with CPAP - encouraged compliance  Jerrye Bushy - recommend Protonix  For questions or updates, please contact [place] Please consult [email or web address] for contact info under        Signed, Cadence Ninfa Meeker, PA-C  03/14/2020 2:18 PM     Patient seen and examined. Agree with assessment and plan.  Mr. Is a very pleasant 86 year old gentleman February 2015 with an ST segment elevation inferior wall myocardial infarction.  At that time he was taken acutely to the laboratory and I performed stenting of his RCA occlusion.  He had residual 60 to 70% mid LAD lesion and was treated medically.  He has a history of PAF, hypertension, hyperlipidemia currently on PCSK9 inhibition in addition to hypertension.  He has remained fairly active.  He denies any exertional chest tightness.  Today he experienced an episode of lower chest pain upper epigastric discomfort which he initially attributed to indigestion.  He had been moving bags of topsoil.  Due to concerns for this discomfort he ultimately presented to the emergency room.  On arrival he was pain-free.  Presently he feels well.  He is not short of breath.  He denies any history of recent chest pain with activity.  He remains fairly active.  There is no JVD.  Lungs were clear.  Rhythm was regular with no ectopy.  There is no chest wall pain.  Abdomen is soft nontender.  Pulses are 2+.  There is no edema.  His ECG is unremarkable.  Pressure is stable.  Troponins are negative at 6 and 4 serially.  Presently, I feel patient is stable and can go home today.  Recommend office visit follow-up  with Dr. Radford Pax.  Consider outpatient nuclear stress test 7 years following his inferior STEMI.   Troy Sine, MD, [facility] 03/14/2020 4:34 PM

## 2020-03-18 ENCOUNTER — Other Ambulatory Visit: Payer: Self-pay

## 2020-03-18 ENCOUNTER — Encounter: Payer: Self-pay | Admitting: Counselor

## 2020-03-18 ENCOUNTER — Ambulatory Visit (INDEPENDENT_AMBULATORY_CARE_PROVIDER_SITE_OTHER): Payer: Medicare Other | Admitting: Counselor

## 2020-03-18 DIAGNOSIS — I679 Cerebrovascular disease, unspecified: Secondary | ICD-10-CM

## 2020-03-18 DIAGNOSIS — F0280 Dementia in other diseases classified elsewhere without behavioral disturbance: Secondary | ICD-10-CM | POA: Diagnosis not present

## 2020-03-18 DIAGNOSIS — G301 Alzheimer's disease with late onset: Secondary | ICD-10-CM | POA: Diagnosis not present

## 2020-03-18 DIAGNOSIS — F0150 Vascular dementia without behavioral disturbance: Secondary | ICD-10-CM | POA: Diagnosis not present

## 2020-03-18 NOTE — Progress Notes (Signed)
[facility]  Patient Name: [name] MRN: [id number] Date of Birth: [birth year] Age: 86 y.o. Education: 60 years  Clinical Impressions  ARCH PLANT is a 86 y.o., right-hand dominant, married man with a history of mild dementia diagnosed by Dr. Delice Lesch in May, 2020. The first changes noticed were increased irritability and agitation, and he also has significant memory loss. They have traditional gender roles and care taking is not easy for his wife and she thinks that she "drives him crazy." He has a CT Head from 04/13/2019 that showsconfluent areas of moderate to severe leukoaraiosis and a significant burden of atrophy with question of more in the mesial temporal areas. On exam within the present evaluation he achieved a 16 on the MoCA, which is significantly lower than his last MoCA of 24/30 just about a year ago. On neuropsychological testing, there is suggestion of memory storage problems, difficulties with visual object naming, and reduced semantic fluency, which are findings often observed in Alzheimer's clinical syndrome. He also had low scores on measures of processing speed and on some measures of executive function including clock drawing, the modified wisconsin card sorting test, and alternating sequencing of numbers and letters of the alphabet. He screened positive for the presence of depression. His wife characterized him as functioning somewhere between a mild to moderate dementia level on severity status staging.   Mr. Gane is thus demonstrating a fairly classic presentation of Alzheimer's clinical syndrome, he may also have some vascular contribution but his memory storage problems, atrophy pattern, and other neuropsychological features are not well explained by vascular disease alone. He appears to be at a mild level of progression.   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 There were additional low scores on indicators of executive abilities and speed of processing. In the context of your clinical history and CT scan of the head, which shows fairly significant small vessel changes (I.e., vascular changes from wear and tear on blood vessels) and some shrinkage in the mesial temporal lobes, I think these findings are highly suggestive of Alzheimer's dementia.   Dementia refers to a group of syndromes where multiple areas of ability are damaged in the brain, such as memory, thinking, judgment, and behavior, and most commonly refer to age related causes of dementia that cause worsening in these abilities over time. Alzheimer's disease is the most common form of dementia in people over the age of 110. Not all dementias are Alzheimer's disease, but all Alzheimer's disease is dementia. When dementia is due to an underlying condition affecting the brain, such as Alzheimer's disease, there is progression over time, which typically procedes gradually over many years.   Dementia is typically grouped into mild, moderate, and severe levels of progression. In your case, I think you are in the mild stage.  You screened positive for the presence of depression. Even though you do not perceive yourself as depressed you did respond to some items suggesting that you feel worthless, are worried something bad will happen to you, and your wife reported that you are spending a lot of time in bed. These symptoms are often seen in depression and I would encourage you to be honest and forthcoming about your feelings in your appointments with your medical treatment  providers so that they can adequately address your symptoms. You are already on  Lexapro, which is a good front-line treatment but if you feel the medication is not helping there are other options you could try.   I think it is appropriate for your wife to provide assistance as needed with memory dependent and other activities, such as attending doctors appointments, managing the finances, and the like at this point in time.   You reported that you are not engaging in many activities and are spending a fair amount of time in bed. No one ages as gracefully when they are not challenging and stimulating themselves with activities that they find enjoyable and interesting. Challenging the mind and other cognitively stimulating activities are encouraged, consider learning a new hobby, reconnecting with old friends, reading an interesting and thought provoking book. It is not so much what you do that is important as it is that you enjoy it and stay at it.   There is now good quality evidence from at least one large scale study that a modified mediterranean diet may help slow cognitive decline. This is known as the "MIND" diet. The Mind diet is not so much a specific diet as it is a set of recommendations for things that you should and should not eat.   Foods that are ENCOURAGED on the MIND Diet:  Green, leafy vegetables: Aim for six or more servings per week. This includes kale, spinach, cooked greens and salads.  All other vegetables: Try to eat another vegetable in addition to the green leafy vegetables at least once a day. It is best to choose non-starchy vegetables because they have a lot of nutrients with a low number of calories.  Berries: Eat berries at least twice a week. There is a plethora of research on strawberries, and other berries such as blueberries, raspberries and blackberries have also been found to have antioxidant and brain health benefits.  Nuts: Try to get five servings of nuts or more each week. The creators of the [place] don't specify what kind of nuts to  consume, but it is probably best to vary the type of nuts you eat to obtain a variety of nutrients. Peanuts are a legume and do not fall into this category.  Olive oil: Use olive oil as your main cooking oil. There may be other heart-healthy alternatives such as algae oil, though there is not yet sufficient research upon which to base a formal recommendation.  Whole grains: Aim for at least three servings daily. Choose minimally processed grains like oatmeal, quinoa, brown rice, whole-wheat pasta and 100% whole-wheat bread.  Fish: Eat fish at least once a week. It is best to choose fatty fish like salmon, sardines, trout, tuna and mackerel for their high amounts of omega-3 fatty acids.  Beans: Include beans in at least four meals every week. This includes all beans, lentils and soybeans.  Poultry: Try to eat chicken or [place] at least twice a week. Note that fried chicken is not encouraged on the MIND diet.  Wine: Aim for no more than one glass of alcohol daily. Both red and white wine may benefit the brain. However, much research has focused on the red wine compound resveratrol, which may help protect against Alzheimer's disease.  Foods that are DISCOURAGED on the MIND Diet: Butter and margarine: Try to eat less than 1 tablespoon (about 14 grams) daily. Instead, try using olive oil as your primary cooking fat, and dipping your bread in olive  oil with herbs.  Cheese: The MIND diet recommends limiting your cheese consumption to less than once per week.  Red meat: Aim for no more than three servings each week. This includes all beef, pork, lamb and products made from these meats.  [place] food: The MIND diet highly discourages fried food, especially the kind from fast-food restaurants. Limit your consumption to less than once per week.  Pastries and sweets: This includes most of the processed junk food and desserts you can think of. Ice cream, cookies, brownies, snack cakes, donuts, candy and more. Try to  limit these to no more than four times a week.  Exercise is one of the best medicines for promoting health and maintaining cognitive fitness at all stages in life. Exercise probably has the largest documented effect on brain health and performance of any intervention. Studies have shown that even previously sedentary individuals who start exercising as late as age 18 show a significant survival benefit as compared to their non-exercising peers. In the [place], the current guidelines are for 30 minutes of moderate exercise per day, but increasing your activity level less than that may also be helpful. You do not have to get your 30 minutes of exercise in one shot and exercising for short periods of time spread throughout the day can be helpful. Go for several walks, learn to dance, or do something else you enjoy that gets your body moving. Of course, if you have an underlying medical condition or there is any question about whether it is safe for you to exercise, you should consult a medical treatment provider prior to beginning exercise.   The Alzheimer's association is a Pensions consultant for individuals with Alzheimer's disease and related disorders (ADRD). They offer a number of different services including support groups, a 24/7 crisis line, useful information, and activities such as their walk to end Alzheimer's to raise awareness about the disease. Many people benefit greatly from interacting with, learning from, and sharing information with others who are going through the same experience. Their website [employer] is a great place to start. If you are interested, I can make a referral for a social worker from the Alzheimer's association to contact you.  As with all capable adults, I would recommend that you consult with an elder care attorney regarding advanced directives if you have not, such as a healthcare power of attorney and living will. Consultation with an elder care attorney  can help you protect your estate and communicate your preferences to your loved ones formally, in the event you are not able to do so yourself. I can provide my strong recommendation for the [employer], who are located at [address] in [place], [place]. They can be reached at [phone]. They also have a website [email or web address].   Test Findings  Test scores are summarized in additional documentation associated with this encounter. Test scores are relative to age, gender, and educational history as available and appropriate. There were no concerns about performance validity as all findings fell within normal expectations.   General Intellectual Functioning/Achievement:  Performance was toward the high average range on single word reading, which presents as a reasonable standard of comparison for Mr. Hammans cognitive test data.   Attention and Processing Efficiency: Indicators of attention and processing efficiency demonstrated reasonable average range scores for digit repetition forward and backward.   With repsect to processing speed, performance was weak and hovered at the margin of the low average  and unusually low ranges on timed number-symbol coding and simple numeric sequencing.   Language: Visual object confrontation naming was marginal with a score at the margin of the low average and unusually low ranges, which may represent some decline. On fluency measures, generation of words in response to phonemic prompts was average but generation of words in response to semantic prompts was extremely low.   Visuospatial Function: Performance on visuospatial and constructional indicators fell at a reasonable low average level overall. Mr. Beni rushed through figure copy and had a very sloppy approach, which generated an unusually low score. By contrast, his judgment of angular line orientations was reasonable and fell at an average level.   Learning and  Memory: Performance on measures of learning and memory was suggestive of storage problems showing on both visual and verbal indicators.   In the verbal realm, Mr. Gleckler demonstrated extremely low performance when attempting to learn a 12-item word list, short story, and brief daily-living type information. Following a standard delay, he did not recall any of this information. His performance was aided to some extent when he was provided with recognition cues for words from the word list with an average score. Recognition for the daily living items was, by contrast, extremely low.   In the visual realm, Mr. Brodzik demonstrated extremely low delayed recall for a modestly complex figure.   Executive Functions: Executive findings were mixed with an overall impression of some impairment. He generated an extremely low score on the Executive Function Composite from the [employer], with an unusually low level of performance for both categories correct and perseverative errors. Alternating sequencing of numbers and letters of the alphabet was unusually low. By contrast, he did well when generating words in response to letter cues with an average score and when reasoning with verbally presented information on the complex ideational material. His clock drawing was consistent with mild impairment, with stimulus bound placement of the hands and minor errors in number placement.   Rating Scale(s): Mr. Majid screened positive for the presence of depression on self-rating and endorsed items suggesting that he feels worthless, he often gets bored, he is worried something bad will happen to him, and he does not feel happy much of the time. His wife rated him as functioning at a mild to moderate dementia level, with mild presenting as a more appropriate description given his overall performance and presentation.   Viviano Simas Nicole Kindred PsyD, [place] Clinical Neuropsychologist

## 2020-03-18 NOTE — Progress Notes (Signed)
[facility]  Patient Name: [name] MRN: [id number] Date of Birth: Apr 23, 1933 Age: 86 y.o. Education: 48 years  Clinical Impressions  MARDELL HILLERS is a 86 y.o., right-hand dominant, married man with a history of mild dementia diagnosed by Dr. Delice Lesch in May, 2020. The first changes noticed were increased irritability and agitation, and he also has significant memory loss. They have traditional gender roles and care taking is not easy for his wife and she thinks that she "drives him crazy." He has a CT Head from 04/13/2019 that showsconfluent areas of moderate to severe leukoaraiosis and a significant burden of atrophy with question of more in the mesial temporal areas. On exam within the present evaluation he achieved a 16 on the MoCA, which is significantly lower than his last MoCA of 24/30 just about a year ago. On neuropsychological testing, there is suggestion of memory storage problems, difficulties with visual object naming, and reduced semantic fluency, which are findings often observed in Alzheimer's clinical syndrome. He also had low scores on measures of processing speed and on some measures of executive function including clock drawing, the modified wisconsin card sorting test, and alternating sequencing of numbers and letters of the alphabet. He screened positive for the presence of depression. His wife characterized him as functioning somewhere between a mild to moderate dementia level on severity status staging.   Mr. Lanius is thus demonstrating a fairly classic presentation of Alzheimer's clinical syndrome, he may also have some vascular contribution but his memory storage problems, atrophy pattern, and other neuropsychological features are not well explained by vascular disease alone. He appears to be at a mild level of progression.   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 There were additional low scores on indicators of executive abilities and speed of processing. In the context of your clinical history and CT scan of the head, which shows fairly significant small vessel changes (I.e., vascular changes from wear and tear on blood vessels) and some shrinkage in the mesial temporal lobes, I think these findings are highly suggestive of Alzheimer's dementia.   Dementia refers to a group of syndromes where multiple areas of ability are damaged in the brain, such as memory, thinking, judgment, and behavior, and most commonly refer to age related causes of dementia that cause worsening in these abilities over time. Alzheimer's disease is the most common form of dementia in people over the age of 56. Not all dementias are Alzheimer's disease, but all Alzheimer's disease is dementia. When dementia is due to an underlying condition affecting the brain, such as Alzheimer's disease, there is progression over time, which typically procedes gradually over many years.   Dementia is typically grouped into mild, moderate, and severe levels of progression. In your case, I think you are in the mild stage.  You screened positive for the presence of depression. Even though you do not perceive yourself as depressed you did respond to some items suggesting that you feel worthless, are worried something bad will happen to you, and your wife reported that you are spending a lot of time in bed. These symptoms are often seen in depression and I would encourage you to be honest and forthcoming about your feelings in your appointments with your medical treatment  providers so that they can adequately address your symptoms. You are already on  Lexapro, which is a good front-line treatment but if you feel the medication is not helping there are other options you could try.   I think it is appropriate for your wife to provide assistance as needed with memory dependent and other activities, such as attending doctors appointments, managing the finances, and the like at this point in time.   You reported that you are not engaging in many activities and are spending a fair amount of time in bed. No one ages as gracefully when they are not challenging and stimulating themselves with activities that they find enjoyable and interesting. Challenging the mind and other cognitively stimulating activities are encouraged, consider learning a new hobby, reconnecting with old friends, reading an interesting and thought provoking book. It is not so much what you do that is important as it is that you enjoy it and stay at it.   There is now good quality evidence from at least one large scale study that a modified mediterranean diet may help slow cognitive decline. This is known as the "MIND" diet. The Mind diet is not so much a specific diet as it is a set of recommendations for things that you should and should not eat.   Foods that are ENCOURAGED on the MIND Diet:  Green, leafy vegetables: Aim for six or more servings per week. This includes kale, spinach, cooked greens and salads.  All other vegetables: Try to eat another vegetable in addition to the green leafy vegetables at least once a day. It is best to choose non-starchy vegetables because they have a lot of nutrients with a low number of calories.  Berries: Eat berries at least twice a week. There is a plethora of research on strawberries, and other berries such as blueberries, raspberries and blackberries have also been found to have antioxidant and brain health benefits.  Nuts: Try to get five servings of nuts or more each week. The creators of the [place] don't specify what kind of nuts to  consume, but it is probably best to vary the type of nuts you eat to obtain a variety of nutrients. Peanuts are a legume and do not fall into this category.  Olive oil: Use olive oil as your main cooking oil. There may be other heart-healthy alternatives such as algae oil, though there is not yet sufficient research upon which to base a formal recommendation.  Whole grains: Aim for at least three servings daily. Choose minimally processed grains like oatmeal, quinoa, brown rice, whole-wheat pasta and 100% whole-wheat bread.  Fish: Eat fish at least once a week. It is best to choose fatty fish like salmon, sardines, trout, tuna and mackerel for their high amounts of omega-3 fatty acids.  Beans: Include beans in at least four meals every week. This includes all beans, lentils and soybeans.  Poultry: Try to eat chicken or [place] at least twice a week. Note that fried chicken is not encouraged on the MIND diet.  Wine: Aim for no more than one glass of alcohol daily. Both red and white wine may benefit the brain. However, much research has focused on the red wine compound resveratrol, which may help protect against Alzheimer's disease.  Foods that are DISCOURAGED on the MIND Diet: Butter and margarine: Try to eat less than 1 tablespoon (about 14 grams) daily. Instead, try using olive oil as your primary cooking fat, and dipping your bread in olive  oil with herbs.  Cheese: The MIND diet recommends limiting your cheese consumption to less than once per week.  Red meat: Aim for no more than three servings each week. This includes all beef, pork, lamb and products made from these meats.  Mace[place] Pro food: The MIND diet highly discourages fried food, especially the kind from fast-food restaurants. Limit your consumption to less than once per week.  Pastries and sweets: This includes most of the processed junk food and desserts you can think of. Ice cream, cookies, brownies, snack cakes, donuts, candy and more. Try to  limit these to no more than four times a week.  Exercise is one of the best medicines for promoting health and maintaining cognitive fitness at all stages in life. Exercise probably has the largest documented effect on brain health and performance of any intervention. Studies have shown that even previously sedentary individuals who start exercising as late as age 36 show a significant survival benefit as compared to their non-exercising peers. In the [place], the current guidelines are for 30 minutes of moderate exercise per day, but increasing your activity level less than that may also be helpful. You do not have to get your 30 minutes of exercise in one shot and exercising for short periods of time spread throughout the day can be helpful. Go for several walks, learn to dance, or do something else you enjoy that gets your body moving. Of course, if you have an underlying medical condition or there is any question about whether it is safe for you to exercise, you should consult a medical treatment provider prior to beginning exercise.   The Alzheimer's association is a Pensions consultant for individuals with Alzheimer's disease and related disorders (ADRD). They offer a number of different services including support groups, a 24/7 crisis line, useful information, and activities such as their walk to end Alzheimer's to raise awareness about the disease. Many people benefit greatly from interacting with, learning from, and sharing information with others who are going through the same experience. Their website [employer].co.nz is a great place to start. If you are interested, I can make a referral for a social worker from the Alzheimer's association to contact you.  As with all capable adults, I would recommend that you consult with an elder care attorney regarding advanced directives if you have not, such as a healthcare power of attorney and living will. Consultation with an elder care attorney  can help you protect your estate and communicate your preferences to your loved ones formally, in the event you are not able to do so yourself. I can provide my strong recommendation for the [employer], who are located at [address] in [place], [place]. They can be reached at [phone]. They also have a website [email or web address].   Test Findings  Test scores are summarized in additional documentation associated with this encounter. Test scores are relative to age, gender, and educational history as available and appropriate. There were no concerns about performance validity as all findings fell within normal expectations.   General Intellectual Functioning/Achievement:  Performance was toward the high average range on single word reading, which presents as a reasonable standard of comparison for Mr. Stan cognitive test data.   Attention and Processing Efficiency: Indicators of attention and processing efficiency demonstrated reasonable average range scores for digit repetition forward and backward.   With repsect to processing speed, performance was weak and hovered at the margin of the low average  and unusually low ranges on timed number-symbol coding and simple numeric sequencing.   Language: Visual object confrontation naming was marginal with a score at the margin of the low average and unusually low ranges, which may represent some decline. On fluency measures, generation of words in response to phonemic prompts was average but generation of words in response to semantic prompts was extremely low.   Visuospatial Function: Performance on visuospatial and constructional indicators fell at a reasonable low average level overall. Mr. Kuehnle rushed through figure copy and had a very sloppy approach, which generated an unusually low score. By contrast, his judgment of angular line orientations was reasonable and fell at an average level.   Learning and  Memory: Performance on measures of learning and memory was suggestive of storage problems showing on both visual and verbal indicators.   In the verbal realm, Mr. Rehbein demonstrated extremely low performance when attempting to learn a 12-item word list, short story, and brief daily-living type information. Following a standard delay, he did not recall any of this information. His performance was aided to some extent when he was provided with recognition cues for words from the word list with an average score. Recognition for the daily living items was, by contrast, extremely low.   In the visual realm, Mr. Adler demonstrated extremely low delayed recall for a modestly complex figure.   Executive Functions: Executive findings were mixed with an overall impression of some impairment. He generated an extremely low score on the Executive Function Composite from the [employer], with an unusually low level of performance for both categories correct and perseverative errors. Alternating sequencing of numbers and letters of the alphabet was unusually low. By contrast, he did well when generating words in response to letter cues with an average score and when reasoning with verbally presented information on the complex ideational material. His clock drawing was consistent with mild impairment, with stimulus bound placement of the hands and minor errors in number placement.   Rating Scale(s): Mr. Panno screened positive for the presence of depression on self-rating and endorsed items suggesting that he feels worthless, he often gets bored, he is worried something bad will happen to him, and he does not feel happy much of the time. His wife rated him as functioning at a mild to moderate dementia level, with mild presenting as a more appropriate description given his overall performance and presentation.   Viviano Simas Nicole Kindred PsyD, [place] Clinical Neuropsychologist

## 2020-03-18 NOTE — Patient Instructions (Signed)
Your performance and presentation on neuropsychological testing were consistent with diminished functioning in several areas including on measures of memory, naming, and semantic fluency. There were additional low scores on indicators of executive abilities and speed of processing. In the context of your clinical history and CT scan of the head, which shows fairly significant small vessel changes (I.e., vascular changes from wear and tear on blood vessels) and some shrinkage in the mesial temporal lobes, I think these findings are highly suggestive of Alzheimer's dementia.   Dementia refers to a group of syndromes where multiple areas of ability are damaged in the brain, such as memory, thinking, judgment, and behavior, and most commonly refer to age related causes of dementia that cause worsening in these abilities over time. Alzheimer's disease is the most common form of dementia in people over the age of 73. Not all dementias are Alzheimer's disease, but all Alzheimer's disease is dementia. When dementia is due to an underlying condition affecting the brain, such as Alzheimer's disease, there is progression over time, which typically procedes gradually over many years.   Dementia is typically grouped into mild, moderate, and severe levels of progression. In your case, I think you are in the mild stage.  You screened positive for the presence of depression. Even though you do not perceive yourself as depressed you did respond to some items suggesting that you feel worthless, are worried something bad will happen to you, and your wife reported that you are spending a lot of time in bed. These symptoms are often seen in depression and I would encourage you to be honest and forthcoming about your feelings in your appointments with your medical treatment providers so that they can adequately address your symptoms. You are already on Lexapro, which is a good front-line treatment but if you feel the medication  is not helping there are other options you could try.   I think it is appropriate for your wife to provide assistance as needed with memory dependent and other activities, such as attending doctors appointments, managing the finances, and the like at this point in time.   You reported that you are not engaging in many activities and are spending a fair amount of time in bed. No one ages as gracefully when they are not challenging and stimulating themselves with activities that they find enjoyable and interesting. Challenging the mind and other cognitively stimulating activities are encouraged, consider learning a new hobby, reconnecting with old friends, reading an interesting and thought provoking book. It is not so much what you do that is important as it is that you enjoy it and stay at it.   There is now good quality evidence from at least one large scale study that a modified mediterranean diet may help slow cognitive decline. This is known as the "MIND" diet. The Mind diet is not so much a specific diet as it is a set of recommendations for things that you should and should not eat.   Foods that are ENCOURAGED on the MIND Diet:  Green, leafy vegetables: Aim for six or more servings per week. This includes kale, spinach, cooked greens and salads.  All other vegetables: Try to eat another vegetable in addition to the green leafy vegetables at least once a day. It is best to choose non-starchy vegetables because they have a lot of nutrients with a low number of calories.  Berries: Eat berries at least twice a week. There is a plethora of research on strawberries, and  other berries such as blueberries, raspberries and blackberries have also been found to have antioxidant and brain health benefits.  Nuts: Try to get five servings of nuts or more each week. The creators of the [place] don't specify what kind of nuts to consume, but it is probably best to vary the type of nuts you eat to obtain a  variety of nutrients. Peanuts are a legume and do not fall into this category.  Olive oil: Use olive oil as your main cooking oil. There may be other heart-healthy alternatives such as algae oil, though there is not yet sufficient research upon which to base a formal recommendation.  Whole grains: Aim for at least three servings daily. Choose minimally processed grains like oatmeal, quinoa, brown rice, whole-wheat pasta and 100% whole-wheat bread.  Fish: Eat fish at least once a week. It is best to choose fatty fish like salmon, sardines, trout, tuna and mackerel for their high amounts of omega-3 fatty acids.  Beans: Include beans in at least four meals every week. This includes all beans, lentils and soybeans.  Poultry: Try to eat chicken or [place] at least twice a week. Note that fried chicken is not encouraged on the MIND diet.  Wine: Aim for no more than one glass of alcohol daily. Both red and white wine may benefit the brain. However, much research has focused on the red wine compound resveratrol, which may help protect against Alzheimer's disease.  Foods that are DISCOURAGED on the MIND Diet: Butter and margarine: Try to eat less than 1 tablespoon (about 14 grams) daily. Instead, try using olive oil as your primary cooking fat, and dipping your bread in olive oil with herbs.  Cheese: The MIND diet recommends limiting your cheese consumption to less than once per week.  Red meat: Aim for no more than three servings each week. This includes all beef, pork, lamb and products made from these meats.  Maceo Pro food: The MIND diet highly discourages fried food, especially the kind from fast-food restaurants. Limit your consumption to less than once per week.  Pastries and sweets: This includes most of the processed junk food and desserts you can think of. Ice cream, cookies, brownies, snack cakes, donuts, candy and more. Try to limit these to no more than four times a week.  Exercise is one of the best  medicines for promoting health and maintaining cognitive fitness at all stages in life. Exercise probably has the largest documented effect on brain health and performance of any intervention. Studies have shown that even previously sedentary individuals who start exercising as late as age 52 show a significant survival benefit as compared to their non-exercising peers. In the [place], the current guidelines are for 30 minutes of moderate exercise per day, but increasing your activity level less than that may also be helpful. You do not have to get your 30 minutes of exercise in one shot and exercising for short periods of time spread throughout the day can be helpful. Go for several walks, learn to dance, or do something else you enjoy that gets your body moving. Of course, if you have an underlying medical condition or there is any question about whether it is safe for you to exercise, you should consult a medical treatment provider prior to beginning exercise.   The Alzheimer's association is a Pensions consultant for individuals with Alzheimer's disease and related disorders (ADRD). They offer a number of different services including support groups, a 24/7 crisis line,  useful information, and activities such as their walk to end Alzheimer's to raise awareness about the disease. Many people benefit greatly from interacting with, learning from, and sharing information with others who are going through the same experience. Their website [employer]Mile[employer]co.nz is a great place to start. If you are interested, I can make a referral for a social worker from the Alzheimer's association to contact you.  There are few things as disruptive to brain functioning as not getting a good night's sleep. For sleep, I recommend against using medications, which can have lingering sedating effects on the brain and rob your brain of restful REM sleep. Instead, consider trying some of the following sleep hygiene  recommendations. They may not work at once and may take effort, but the effort you spend is likely to be rewarded with better sleep eventually:  . Stick to a sleep schedule of the same bedtime and wake time even on the weekends, which can help to regulate your body's internal clock so that you fall asleep and stay asleep.  . Practice a relaxing bedtime ritual (conducted away from bright lights) which will help separate your sleep from stimulating activities and prepare your body to fall asleep when you go to bed.  . Avoid naps, especially in the afternoon.  . Evaluate your room and create conditions that will promote sleep such as keeping it cool (between 60 - 67 degrees), quiet, and free from any lights. Consider using blackout curtains, a "white noise" generator, or fan that will help mask any noises that might prevent you from going to sleep or awaken you during the night.  . Sleep on a comfortable mattress and pillows.  . Avoid bright light in the evening and excessive use of portable electronic devices right before bed that may contain light frequencies that can contribute to sleep problems.  . Avoid alcohol, cigarettes, or heavy meals in the evening. If you must eat, consume a light snack 45 minutes before bed.  . Use your bed only for sleep to strengthen the association between your bed and sleep.  . If you can't go to sleep within 30 minutes, go into another room and do something relaxing until you feel tired. Then, come back and try to go to sleep again for 30 minutes and repeat until sleep is achieved.  . Some people find over the counter melatonin to be helpful for sleep, which you could discuss with a pharmacist or prescribing provider.

## 2020-03-18 NOTE — Progress Notes (Signed)
[facility]  I met with Quin Hoop Hayse to review the findings resulting from his neuropsychological evaluation. He was attended by his wife, [name], and his son Edman. Since the last appointment, things have been about the same.Time was spent reviewing the impressions and recommendations that are detailed in the evaluation report. We had a productive discussion about his cognitive test performance, diagnostic status, and reviewed his neuroimaging. I reviewed dietary and lifestyle changes to maintain cognitive longevity. Interventions provided during this encounter included psychoeducation, empathic reflection, and other topics as reflected in the patient instructions. I took time to explain the findings and answer all the patient's questions. I encouraged Mr. Stogsdill to contact me should he have any further questions or if further follow up is desired.   Current Medications and Medical History   Current Outpatient Medications  Medication Sig Dispense Refill  . acetaminophen (TYLENOL) 500 MG tablet Take 1,000 mg by mouth every 6 (six) hours as needed.    [name] acidophilus (RISAQUAD) CAPS capsule Take 1 capsule by mouth daily.    [name] aspirin EC 81 MG tablet Take 81 mg by mouth daily.    [name] donepezil (ARICEPT) 10 MG tablet Take 5 mg by mouth at bedtime.    [name] ELIQUIS 5 MG TABS tablet TAKE ONE TABLET TWICE DAILY 180 tablet 1  . escitalopram (LEXAPRO) 20 MG tablet Take 1 tablet (20 mg total) by mouth daily. 90 tablet 3  . ezetimibe (ZETIA) 10 MG tablet TAKE ONE-HALF TABLET DAILY 45 tablet 3  . lisinopril (ZESTRIL) 10 MG tablet Take 1 tablet (10 mg total) by mouth daily. 90 tablet 3  . memantine (NAMENDA) 10 MG tablet Take 1 tablet at night for 2 weeks, then increase to 1 tablet twice a day 60 tablet 11  . metoprolol tartrate (LOPRESSOR) 25 MG tablet TAKE ONE TABLET TWICE DAILY 180 tablet 2  . nitroGLYCERIN (NITROSTAT) 0.4 MG SL tablet ONE TABLET UNDER TONGUE AS NEEDED FOR  CHEST PAIN. MAY REPEAT IN 5 MINUTES AND AGAIN IN 5 MINUTES (TOTAL OF 3 TABLETS) 25 tablet 6  . omeprazole (PRILOSEC) 40 MG capsule TAKE ONE CAPSULE EACH DAY 90 capsule 3  . predniSONE (DELTASONE) 10 MG tablet Take 20 mg every morning for 2 weeks, then 50 mg every morning for 2 weeks, then 10 mg every morning.  Take with food. 100 tablet 1  . Propylene Glycol (SYSTANE BALANCE OP) Place 2 drops into both eyes daily as needed (dry eyes).     [name] REPATHA SURECLICK 536 MG/ML SOAJ INJECT 1 PEN INTO SKIN EVERY 14 DAYS 2 mL 11  . rosuvastatin (CRESTOR) 5 MG tablet Take 1 tablet (5 mg total) by mouth daily. 90 tablet 3  . zolpidem (AMBIEN) 10 MG tablet TAKE 1/2 TO 1 TABLET AT BEDTIME AS NEEDED FOR SLEEP 30 tablet 3   No current facility-administered medications for this visit.    Patient Active Problem List   Diagnosis Date Noted  . Chest pain   . Dyspepsia   . Encounter for medication management 11/14/2019  . Angular stomatitis 07/09/2019  . Cervical radiculopathy 06/05/2019  . Pain in joint of left shoulder 05/31/2019  . Concussion 04/17/2019  . Hemoptysis 04/05/2019  . Confusion 04/05/2019  . Memory loss 12/21/2018  . Nausea & vomiting 02/07/2018  . Chills 02/07/2018  . Rash 02/07/2018  . Insomnia 09/22/2017  . Cervical spondylolysis 06/28/2017  . Hyperglycemia 06/22/2017  . Well adult exam 11/18/2016  . Hydronephrosis 11/18/2016  .  UPJ obstruction, 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  . Allergic rhinitis 06/02/2016  . Asthma with exacerbation 06/02/2016  . Sinusitis, chronic 10/17/2015  . OSA (obstructive sleep apnea) 06/19/2015  . Cellulitis and abscess of leg, except foot 05/26/2015  . Paroxysmal atrial fibrillation ([place]) 02/21/2015  . CAP (community acquired pneumonia) 01/02/2015  . Upper respiratory infection 12/02/2014  . Vivid dream 09/25/2014  . Actinic keratoses 04/26/2014  . CAD (coronary artery disease) 02/25/2014  . STEMI (ST elevation myocardial infarction) ([place]) 01/08/2014  .  Rheumatoid bursitis of left elbow ([place]) 08/08/2013  . Situational depression 06/18/2013  . Neck pain 04/23/2012  . Acute abdominal pain in left flank 02/14/2012  . Hypertension 09/23/2011  . Chest pain, midsternal 09/13/2011  . Mixed hyperlipidemia 07/23/2010  . Palpitations 07/07/2010  . Rheumatoid arthritis ([place]) 06/04/2010  . PARESTHESIA 06/04/2010  . ARTHRALGIA 11/27/2009  . MYALGIA 08/21/2009  . ALLERGIC RHINITIS 06/11/2008  . OSTEOARTHRITIS 06/11/2008  . FATIGUE 06/11/2008  . LOW BACK PAIN 07/27/2007  . COLONIC POLYPS, HX OF 07/27/2007    Mental Status and Behavioral Observations  Mr. Baldi presented to the appointment on time, accompanied by his wife and son. He was alert and generally oriented (orientation not formally assessed). His self-reported mood was good, and he once again exercised his keen sense of humor. His affect was mainly neutral. His thought process was logical, linear, and goal-directed, although he did have a hard time tracking the topics that were discussed and asked for repetition a fair amount (in fairness, he did not wear his hearing aids). Thought content was appropriate. There were no safety concerns identified at today's encounter.   Plan  Feedback provided regarding the patient's neuropsychological evaluation. We had a productive discussion about his diagnosis, prognosis, and palliative strategies. He is already on Aricept and will continue to monitor his response. He is on Lexapro and his wife reported his activity level has been better with the weather now being nicer and them having been vaccinated. Justino Boze [facility] was encouraged to contact me if any questions arise or if further follow up is desired.   Viviano Simas Nicole Kindred, PsyD, ABN Clinical Neuropsychologist  Service(s) Provided at This Encounter: 60 minutes [phone]; Psychotherapy with patient/family)

## 2020-03-27 ENCOUNTER — Ambulatory Visit: Payer: Medicare Other | Admitting: Cardiology

## 2020-04-01 DIAGNOSIS — M0609 Rheumatoid arthritis without rheumatoid factor, multiple sites: Secondary | ICD-10-CM | POA: Diagnosis not present

## 2020-04-01 NOTE — Progress Notes (Signed)
Date:  04/02/2020   ID:  [name], DOB 01-Feb-1933, MRN [id number]   PCP:  Cassandria Anger, MD  Cardiologist:  Fransico Him, MD  Electrophysiologist:  Thompson Grayer, MD   Chief Complaint:  OSA, HTN, CAD, HLD  History of Present Illness:    [name] is a 86 y.o. male who presents via audio/video conferencing for a telehealth visit today.    [name] is an 86 y.o. male with a hx of CAD (inferior STEMI 12/2013 with total RCA occlusion s/p DESx2, residual 60-70% mLAD), paroxysmal atrial fib (dx 02/2015), PACs, OSA on CPAP, GERD, HTN, HLD(prior intolerances to other statins).  He has moderate OSA with an AHI of 18/hr.   He is here today for followup and is doing well.  He was recently seen in the ER for an episode of chest pain associated with SOB.  He had been loading bags of mulch at the store and then working out in the yard putting mulch out when he developed an episode of CP with SOB.  He went to the ER for evaluation and workup was normal.  He has not had any further episodes of CP since then and walks his dog daily. He denies any  DOE, PND, orthopnea, LE edema, dizziness, palpitations or syncope. He is compliant with his meds and is tolerating meds with no SE.  He stopped using his CPAP because he thought that he did not have any OSA anymore because he was not waking up.  He sleeps better without the PAP.  The patient does not have symptoms concerning for COVID-19 infection (fever, chills, cough, or new shortness of breath).   Prior CV studies:   The following studies were reviewed today:  Outside labs from PCP  Past Medical History:  Diagnosis Date  . CAD (coronary artery disease)    a. s/p inferior STEMI 01/08/2014; LHC 01/08/14: total RCA occlusion s/p 3.5x69mm Xience DES distal RCA and 3.5x28 mm DES mid RCA, 60-70% mid LAD stenosis, EF 55%.  . Complication of anesthesia    'long to wake up after back surgery " 09/2003  . Diverticulosis of colon   . GERD  (gastroesophageal reflux disease)   . History of cellulitis    05-26-2015  LLE  . History of colon polyps    1998- benign/  2008 adenomatous   . History of kidney stones    2013  . History of squamous cell carcinoma excision    2013;  2015;  06-12-2015 right leg/  02/ and 05/ 2017  left ear and left leg  . Hydronephrosis, left   . Hyperlipidemia   . Hypertension   . Migraine with aura   . OSA on CPAP 06/19/2015   Moderate OSA with AHI 18/hr  per study 05-20-2015  . Osteoarthritis   . Paroxysmal atrial fibrillation ([place]) 4/16   chads2vasc score is at least 4  . Premature atrial contractions   . RA (rheumatoid arthritis) [facility])    rheumatologist-  dr Leigh Aurora  . Sinusitis, chronic 10/17/2015  . Wears glasses   . Wears hearing aid    bilateral   Past Surgical History:  Procedure Laterality Date  . BACK SURGERY    . CARDIOVASCULAR STRESS TEST  11/21/2015   Low risk nuclear study w/ a small diaphragmatic attenuation artifact, no ischemia/  normal LV function and wall motion , ef 63%  . CATARACT EXTRACTION W/ INTRAOCULAR LENS  IMPLANT, BILATERAL  2015  . COLONOSCOPY  last one 06-09-2011  . CORONARY ANGIOPLASTY    . CYSTOSCOPY W/ RETROGRADES Left 07/12/2016   Procedure: CYSTOSCOPY WITH RETROGRADE PYELOGRAM LEFT URETERAL STENT;  Surgeon: Irine Seal, MD;  Location: [facility];  Service: Urology;  Laterality: Left;  . CYSTOSCOPY WITH RETROGRADE PYELOGRAM, URETEROSCOPY AND STENT PLACEMENT Left 08/11/2016   Procedure: CYSTOSCOPY WITH RETROGRADE PYELOGRAM,  DIAGNOSTIC URETEROSCOPY , STENT EXCHANGE;  Surgeon: Alexis Frock, MD;  Location: [facility];  Service: Urology;  Laterality: Left;  . 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   severe fibromatosis palm and fingers  . LEFT HEART CATHETERIZATION WITH CORONARY ANGIOGRAM N/A 01/08/2014   Procedure: LEFT HEART CATHETERIZATION WITH CORONARY ANGIOGRAM;  Surgeon: Troy Sine, MD;  Location: [facility] CATH LAB;  Service:  Cardiovascular;  Laterality:N/A;  total/ subtotal RCA/  mLAD [id number] w/ mid systolic bridging/  preserved global LVF, ef 55%  . MOHS SURGERY  x2  feb and may 2017   left ear /  left leg  ([facility])  . PERCUTANEOUS CORONARY STENT INTERVENTION (PCI-S)  01/08/2014   Procedure: PERCUTANEOUS CORONARY STENT INTERVENTION (PCI-S);  Surgeon: Troy Sine, MD;  Location: [facility] CATH LAB;  Service: Cardiovascular;;  DES to mid and distal RCA  . POSTERIOR LUMBAR FUSION  10/01/2003   and Laminectomy/ diskectomy  L4 -- S1  . ROBOT ASSISTED PYELOPLASTY Left 09/15/2016   Procedure: XI ROBOTIC ASSISTED PYELOPLASTY;  Surgeon: Alexis Frock, MD;  Location: [facility];  Service: Urology;  Laterality: Left;  . TONSILLECTOMY    . TRANSTHORACIC ECHOCARDIOGRAM  02/23/2016   mild LVH,  ef 50-55%,  grade 1 diastolic dysfunction/  mild to moderate AV calcification w/ no stenosis or regurg./  trivial MR and TR/  mild PR     Current Meds  Medication Sig  . acetaminophen (TYLENOL) 500 MG tablet Take 1,000 mg by mouth every 6 (six) hours as needed.  [name] acidophilus (RISAQUAD) CAPS capsule Take 1 capsule by mouth daily.  [name] aspirin EC 81 MG tablet Take 81 mg by mouth daily.  [name] donepezil (ARICEPT) 10 MG tablet Take 5 mg by mouth at bedtime.  [name] ELIQUIS 5 MG TABS tablet TAKE ONE TABLET TWICE DAILY  . escitalopram (LEXAPRO) 20 MG tablet Take 1 tablet (20 mg total) by mouth daily.  [name] ezetimibe (ZETIA) 10 MG tablet TAKE ONE-HALF TABLET DAILY  . lisinopril (ZESTRIL) 10 MG tablet Take 1 tablet (10 mg total) by mouth daily.  . memantine (NAMENDA) 10 MG tablet Take 1 tablet at night for 2 weeks, then increase to 1 tablet twice a day  . metoprolol tartrate (LOPRESSOR) 25 MG tablet TAKE ONE TABLET TWICE DAILY  . nitroGLYCERIN (NITROSTAT) 0.4 MG SL tablet ONE TABLET UNDER TONGUE AS NEEDED FOR CHEST PAIN. MAY REPEAT IN 5 MINUTES AND AGAIN IN 5 MINUTES (TOTAL OF 3 TABLETS)  . omeprazole (PRILOSEC) 40 MG capsule TAKE ONE CAPSULE EACH DAY  . Propylene  Glycol (SYSTANE BALANCE OP) Place 2 drops into both eyes daily as needed (dry eyes).   [name] REPATHA SURECLICK [id number] MG/ML SOAJ INJECT 1 PEN INTO SKIN EVERY 14 DAYS  . rosuvastatin (CRESTOR) 5 MG tablet Take 1 tablet (5 mg total) by mouth daily.  [name] zolpidem (AMBIEN) 10 MG tablet TAKE 1/2 TO 1 TABLET AT BEDTIME AS NEEDED FOR SLEEP     Allergies:   Methotrexate, Aricept [donepezil hcl], Crestor [rosuvastatin calcium], Lisinopril, Atorvastatin, and Pravastatin sodium   Social History   Tobacco Use  . Smoking status: Former Smoker  Years: 10.00    Types: Cigarettes    Quit date: 08/09/1968    Years since quitting: 51.6  . Smokeless tobacco: Never Used  Substance Use Topics  . Alcohol use: Yes    Alcohol/week: 2.0 standard drinks    Types: 2 Shots of liquor per week    Comment: daily  . Drug use: No     Family Hx: The patient's family history includes Heart disease in his father; Hypertension in his mother. There is no history of Colon cancer.  ROS:   Please see the history of present illness.     All other systems reviewed and are negative.   Labs/Other Tests and Data Reviewed:    Recent Labs: 03/03/2020: ALT 11 03/14/2020: BUN 15; Creatinine, Ser 0.93; Hemoglobin 13.8; Platelets 299; Potassium 3.7; Sodium 141   Recent Lipid Panel Lab Results  Component Value Date/Time   CHOL 203 (H) 03/03/2020 10:36 AM   TRIG 275 (H) 03/03/2020 10:36 AM   HDL 64 03/03/2020 10:36 AM   CHOLHDL 3.2 03/03/2020 10:36 AM   CHOLHDL 3 02/21/2017 01:49 PM   LDLCALC 93 03/03/2020 10:36 AM   LDLDIRECT 168.9 05/24/2013 07:32 AM    Wt Readings from Last 3 Encounters:  04/02/20 190 lb 9.6 oz (86.5 kg)  03/14/20 187 lb 6.3 oz (85 kg)  03/06/20 188 lb (85.3 kg)     Objective:    Vital Signs:  BP 128/68   Pulse 85   Ht 6' (1.829 m)   Wt 190 lb 9.6 oz (86.5 kg)   SpO2 100%   BMI 25.85 kg/m    GEN: Well nourished, well developed in no acute distress HEENT: Normal NECK: No JVD; No carotid  bruits LYMPHATICS: No lymphadenopathy CARDIAC:RRR, no murmurs, rubs, gallops RESPIRATORY:  Clear to auscultation without rales, wheezing or rhonchi  ABDOMEN: Soft, non-tender, non-distended MUSCULOSKELETAL:  No edema; No deformity  SKIN: Warm and dry NEUROLOGIC:  Alert and oriented x 3 PSYCHIATRIC:  Normal affect    ASSESSMENT & PLAN:    1.  OSA  -   He stopped using his CPAP as he was not sleeping well with it and did not think he still had OSA -We discussed the outcomes of patients with untreated OSA including CHF, progression of CAD, HTN, increased episodes of Afib -I will get a home sleep study to assess for ongoing OSA   2.  HTN -BP controlled -continue Lopressor 25mg  BID and lisinopril 10mg  daily  3.  ASCAD -s/p inferior STEMI 12/2013 with total RCA occlusion s/p DESx2, residual 60-70% mLAD.  -he recently had an episode of CP while loading mulch and then putting out in his yard -he has not had any further CP and walks his dog daily -I will get a Lexiscan myoview to rule out ischemia -continue ASA 81mg  daily, BB and statin  4.  PAF -he is maintaining NSR and has no palpitations -continue Lopressor 25mg  BID and Eliquis 5mg  BID(wt >80Kg and creatinine 0.93 and Hbg 13.8 in April 2021) -Denies any bleeding with DOAC  5.  HLD -LDL goal < 70 -LDL 93 in April 2021 -continue Crestor 5mg  daily, Repatha and Zetia 10mg  daily -encouraged to work on diet and exercise -repeat FLP in 4 months and if still elevated consider addition of Bempedoic acid  Patient Risk:   After full review of this patient's clinical status, I feel that they are at least moderate risk at this time.  Medication Adjustments/Labs and Tests Ordered: Current medicines are reviewed  at length with the patient today.  Concerns regarding medicines are outlined above.  Tests Ordered: No orders of the defined types were placed in this encounter.  Medication Changes: No orders of the defined types were placed in  this encounter.   Disposition:  Follow up in 1 year(s)  Signed, Fransico Him, MD  04/02/2020 9:30 AM    [facility]

## 2020-04-02 ENCOUNTER — Ambulatory Visit (INDEPENDENT_AMBULATORY_CARE_PROVIDER_SITE_OTHER): Payer: Medicare Other | Admitting: Cardiology

## 2020-04-02 ENCOUNTER — Encounter: Payer: Self-pay | Admitting: Cardiology

## 2020-04-02 ENCOUNTER — Other Ambulatory Visit: Payer: Self-pay

## 2020-04-02 ENCOUNTER — Telehealth: Payer: Self-pay | Admitting: *Deleted

## 2020-04-02 ENCOUNTER — Encounter (HOSPITAL_COMMUNITY): Payer: Self-pay | Admitting: *Deleted

## 2020-04-02 ENCOUNTER — Telehealth (HOSPITAL_COMMUNITY): Payer: Self-pay | Admitting: *Deleted

## 2020-04-02 VITALS — BP 128/68 | HR 85 | Ht 72.0 in | Wt 190.6 lb

## 2020-04-02 DIAGNOSIS — E782 Mixed hyperlipidemia: Secondary | ICD-10-CM

## 2020-04-02 DIAGNOSIS — I480 Paroxysmal atrial fibrillation: Secondary | ICD-10-CM | POA: Diagnosis not present

## 2020-04-02 DIAGNOSIS — G4733 Obstructive sleep apnea (adult) (pediatric): Secondary | ICD-10-CM | POA: Diagnosis not present

## 2020-04-02 DIAGNOSIS — I10 Essential (primary) hypertension: Secondary | ICD-10-CM

## 2020-04-02 DIAGNOSIS — Z9989 Dependence on other enabling machines and devices: Secondary | ICD-10-CM

## 2020-04-02 DIAGNOSIS — I2510 Atherosclerotic heart disease of native coronary artery without angina pectoris: Secondary | ICD-10-CM | POA: Diagnosis not present

## 2020-04-02 NOTE — Patient Instructions (Signed)
Medication Instructions:  Your physician recommends that you continue on your current medications as directed. Please refer to the Current Medication list given to you today.  *If you need a refill on your cardiac medications before your next appointment, please call your pharmacy*  Lab Work: Fasting lipid panel in 4 months  If you have labs (blood work) drawn today and your tests are completely normal, you will receive your results only by: [name] MyChart Message (if you have MyChart) OR . A paper copy in the mail If you have any lab test that is abnormal or we need to change your treatment, we will call you to review the results.   Testing/Procedures: Your physician has requested that you have a lexiscan myoview. For further information please visit [email or web address]. Please follow instruction sheet, as given.  Your physician has recommended that you have a sleep study. This test records several body functions during sleep, including: brain activity, eye movement, oxygen and carbon dioxide blood levels, heart rate and rhythm, breathing rate and rhythm, the flow of air through your mouth and nose, snoring, body muscle movements, and chest and belly movement.  Follow-Up: At [facility], you and your health needs are our priority.  As part of our continuing mission to provide you with exceptional heart care, we have created designated Provider Care Teams.  These Care Teams include your primary Cardiologist (physician) and Advanced Practice Providers (APPs -  Physician Assistants and Nurse Practitioners) who all work together to provide you with the care you need, when you need it.  Your next appointment:   6 month(s)  The format for your next appointment:   In Person  Provider:   You may see Fransico Him, MD or one of the following Advanced Practice Providers on your designated Care Team:    Melina Copa, PA-C  Ermalinda Barrios, PA-C

## 2020-04-02 NOTE — Telephone Encounter (Signed)
-----   Message from Antonieta Iba, RN sent at 04/02/2020  9:44 AM EDT ----- Regarding: Home sleep study Home sleep study has been ordered.  Thanks!

## 2020-04-02 NOTE — Telephone Encounter (Signed)
Left message on voicemail per DPR in reference to upcoming appointment scheduled on 04/09/2020 at 0715 with detailed instructions given per Myocardial Perfusion Study Information Sheet for the test. LM to arrive 15 minutes early, and that it is imperative to arrive on time for appointment to keep from having the test rescheduled. If you need to cancel or reschedule your appointment, please call the office within 24 hours of your appointment. Failure to do so may result in a cancellation of your appointment, and a $50 no show fee. Phone number given for call back for any questions.   mychart letter sent with instructions.Dacian Orrico, Ranae Palms

## 2020-04-09 ENCOUNTER — Telehealth: Payer: Self-pay | Admitting: *Deleted

## 2020-04-09 ENCOUNTER — Ambulatory Visit (HOSPITAL_COMMUNITY): Payer: Medicare Other | Attending: Cardiology

## 2020-04-09 ENCOUNTER — Other Ambulatory Visit: Payer: Self-pay

## 2020-04-09 DIAGNOSIS — I2510 Atherosclerotic heart disease of native coronary artery without angina pectoris: Secondary | ICD-10-CM | POA: Insufficient documentation

## 2020-04-09 LAB — MYOCARDIAL PERFUSION IMAGING
LV dias vol: 67 mL (ref 62–150)
LV sys vol: 22 mL
Peak HR: 90 {beats}/min
Rest HR: 62 {beats}/min
SDS: 0
SRS: 0
SSS: 0
TID: 0.86

## 2020-04-09 MED ORDER — REGADENOSON 0.4 MG/5ML IV SOLN
0.4000 mg | Freq: Once | INTRAVENOUS | Status: AC
  Administered 2020-04-09: 0.4 mg via INTRAVENOUS

## 2020-04-09 MED ORDER — TECHNETIUM TC 99M TETROFOSMIN IV KIT
10.3000 | PACK | Freq: Once | INTRAVENOUS | Status: AC | PRN
  Administered 2020-04-09: 10.3 via INTRAVENOUS
  Filled 2020-04-09: qty 11

## 2020-04-09 MED ORDER — TECHNETIUM TC 99M TETROFOSMIN IV KIT
31.6000 | PACK | Freq: Once | INTRAVENOUS | Status: AC | PRN
  Administered 2020-04-09: 31.6 via INTRAVENOUS
  Filled 2020-04-09: qty 32

## 2020-04-09 NOTE — Telephone Encounter (Signed)
-----   Message from Antonieta Iba, RN sent at 04/02/2020  9:44 AM EDT ----- Regarding: Home sleep study Home sleep study has been ordered.  Thanks!

## 2020-04-09 NOTE — Telephone Encounter (Signed)
Staff message sent to [name] ok to schedule HST.  Patient has Medicare. No PA is required.

## 2020-04-15 ENCOUNTER — Other Ambulatory Visit: Payer: Self-pay | Admitting: Internal Medicine

## 2020-04-15 NOTE — Telephone Encounter (Signed)
RE: Home sleep study Lauralee Evener, CMA  Freada Bergeron, CMA  Ok to schedule HST. Patient has Medicare. No PA is required

## 2020-04-15 NOTE — Telephone Encounter (Signed)
Patient is aware and agreeable to Home Sleep Study through [facility]. Patient is scheduled for 06/16/20 at 11 am to pick up home sleep kit and meet with Respiratory therapist at [facility]. Patient is aware that if this appointment date and time does not work for them they should contact Artis Delay directly at [phone]. Patient is aware that a sleep packet will be sent from [facility] in week. Left detailed message on voicemail with date and time of HST and informed patient to call back to confirm or reschedule.

## 2020-04-29 DIAGNOSIS — M0609 Rheumatoid arthritis without rheumatoid factor, multiple sites: Secondary | ICD-10-CM | POA: Diagnosis not present

## 2020-06-02 DIAGNOSIS — H524 Presbyopia: Secondary | ICD-10-CM | POA: Diagnosis not present

## 2020-06-02 DIAGNOSIS — H52201 Unspecified astigmatism, right eye: Secondary | ICD-10-CM | POA: Diagnosis not present

## 2020-06-02 DIAGNOSIS — Z961 Presence of intraocular lens: Secondary | ICD-10-CM | POA: Diagnosis not present

## 2020-06-10 ENCOUNTER — Ambulatory Visit: Payer: Medicare Other | Admitting: Internal Medicine

## 2020-06-11 ENCOUNTER — Ambulatory Visit (INDEPENDENT_AMBULATORY_CARE_PROVIDER_SITE_OTHER): Payer: Medicare Other

## 2020-06-11 ENCOUNTER — Other Ambulatory Visit: Payer: Self-pay

## 2020-06-11 ENCOUNTER — Ambulatory Visit (INDEPENDENT_AMBULATORY_CARE_PROVIDER_SITE_OTHER): Payer: Medicare Other | Admitting: Internal Medicine

## 2020-06-11 ENCOUNTER — Encounter: Payer: Self-pay | Admitting: Internal Medicine

## 2020-06-11 VITALS — BP 122/70 | HR 61 | Temp 98.3°F | Resp 16 | Ht 72.0 in | Wt 189.8 lb

## 2020-06-11 DIAGNOSIS — R413 Other amnesia: Secondary | ICD-10-CM

## 2020-06-11 DIAGNOSIS — I480 Paroxysmal atrial fibrillation: Secondary | ICD-10-CM

## 2020-06-11 DIAGNOSIS — I2510 Atherosclerotic heart disease of native coronary artery without angina pectoris: Secondary | ICD-10-CM | POA: Diagnosis not present

## 2020-06-11 DIAGNOSIS — E785 Hyperlipidemia, unspecified: Secondary | ICD-10-CM

## 2020-06-11 DIAGNOSIS — I10 Essential (primary) hypertension: Secondary | ICD-10-CM

## 2020-06-11 DIAGNOSIS — N320 Bladder-neck obstruction: Secondary | ICD-10-CM

## 2020-06-11 DIAGNOSIS — Z0000 Encounter for general adult medical examination without abnormal findings: Secondary | ICD-10-CM | POA: Diagnosis not present

## 2020-06-11 DIAGNOSIS — M069 Rheumatoid arthritis, unspecified: Secondary | ICD-10-CM

## 2020-06-11 NOTE — Progress Notes (Addendum)
Subjective:   [name] is a 86 y.o. male who presents for Medicare Annual/Subsequent preventive examination.  Review of Systems    No ROS. Medicare Wellness Visit. Cardiac Risk Factors include: advanced age (>79men, >67 women);dyslipidemia;family history of premature cardiovascular disease;hypertension;male gender     Objective:    Today's Vitals   06/11/20 1013 06/11/20 1021  BP: 122/70   Pulse: 61   Resp: 16   Temp: 98.3 F (36.8 C)   SpO2: 97%   Weight: 189 lb 12.8 oz (86.1 kg)   Height: 6' (1.829 m)   PainSc: 0-No pain 0-No pain   Body mass index is 25.74 kg/m.  Advanced Directives 06/11/2020 01/04/2020 04/13/2019 04/10/2019 11/23/2018 11/22/2017 11/18/2016  Does Patient Have a Medical Advance Directive? Yes Yes No Yes Yes Yes Yes  Type of Paramedic of Campus;Living will [place];Living will - [place];Living will [place];Living will [place];Living will [place];Living will  Does patient want to make changes to medical advance directive? No - Patient declined - - - - - -  Copy of Press photographer in Chart? - - - - No - copy requested No - copy requested No - copy requested  Would patient like information on creating a medical advance directive? - - No - Patient declined - - - -  Pre-existing out of facility DNR order (yellow form or pink MOST form) - - - - - - -    Current Medications (verified) Outpatient Encounter Medications as of 06/11/2020  Medication Sig   acetaminophen (TYLENOL) 500 MG tablet Take 1,000 mg by mouth every 6 (six) hours as needed.   acidophilus (RISAQUAD) CAPS capsule Take 1 capsule by mouth daily.   aspirin EC 81 MG tablet Take 81 mg by mouth daily.   donepezil (ARICEPT) 10 MG tablet Take 5 mg by mouth at bedtime.   ELIQUIS 5 MG TABS tablet TAKE ONE TABLET TWICE DAILY   escitalopram (LEXAPRO) 20 MG tablet Take  1 tablet (20 mg total) by mouth daily.   ezetimibe (ZETIA) 10 MG tablet TAKE ONE-HALF TABLET DAILY   lisinopril (ZESTRIL) 10 MG tablet Take 1 tablet (10 mg total) by mouth daily.   memantine (NAMENDA) 10 MG tablet Take 1 tablet at night for 2 weeks, then increase to 1 tablet twice a day   metoprolol tartrate (LOPRESSOR) 25 MG tablet TAKE ONE TABLET TWICE DAILY   nitroGLYCERIN (NITROSTAT) 0.4 MG SL tablet ONE TABLET UNDER TONGUE AS NEEDED FOR CHEST PAIN. MAY REPEAT IN 5 MINUTES AND AGAIN IN 5 MINUTES (TOTAL OF 3 TABLETS)   omeprazole (PRILOSEC) 40 MG capsule TAKE ONE CAPSULE EACH DAY   Propylene Glycol (SYSTANE BALANCE OP) Place 2 drops into both eyes daily as needed (dry eyes).    REPATHA SURECLICK 716 MG/ML SOAJ INJECT 1 PEN INTO SKIN EVERY 14 DAYS   rosuvastatin (CRESTOR) 5 MG tablet Take 1 tablet (5 mg total) by mouth daily.   zolpidem (AMBIEN) 10 MG tablet TAKE 1/2 TO 1 TABLET AT BEDTIME AS NEEDED FOR SLEEP   No facility-administered encounter medications on file as of 06/11/2020.    Allergies (verified) Methotrexate, Aricept [donepezil hcl], Crestor [rosuvastatin calcium], Lisinopril, Atorvastatin, and Pravastatin sodium   History: Past Medical History:  Diagnosis Date   CAD (coronary artery disease)    a. s/p inferior STEMI 01/08/2014; LHC 01/08/14: total RCA occlusion s/p 3.5x74mm Xience DES distal RCA  and 3.5x28 mm DES mid RCA, 60-70% mid LAD stenosis, EF 55%.   Complication of anesthesia    'long to wake up after back surgery " 09/2003   Diverticulosis of colon    GERD (gastroesophageal reflux disease)    History of cellulitis    05-26-2015  LLE   History of colon polyps    1998- benign/  2008 adenomatous    History of kidney stones    2013   History of squamous cell carcinoma excision    2013;  2015;  06-12-2015 right leg/  02/ and 05/ 2017  left ear and left leg   Hydronephrosis, left    Hyperlipidemia    Hypertension    Migraine with aura    OSA on CPAP 06/19/2015    Moderate OSA with AHI 18/hr  per study 05-20-2015   Osteoarthritis    Paroxysmal atrial fibrillation ([address]) 4/16   chads2vasc score is at least 4   Premature atrial contractions    RA (rheumatoid arthritis) [facility])    rheumatologist-  dr Leigh Aurora   Sinusitis, chronic 10/17/2015   Wears glasses    Wears hearing aid    bilateral   Past Surgical History:  Procedure Laterality Date   BACK SURGERY     CARDIOVASCULAR STRESS TEST  11/21/2015   Low risk nuclear study w/ a small diaphragmatic attenuation artifact, no ischemia/  normal LV function and wall motion , ef 63%   CATARACT EXTRACTION W/ INTRAOCULAR LENS  IMPLANT, BILATERAL  2015   COLONOSCOPY  last one 06-09-2011   CORONARY ANGIOPLASTY     CYSTOSCOPY W/ RETROGRADES Left 07/12/2016   Procedure: CYSTOSCOPY WITH RETROGRADE PYELOGRAM LEFT URETERAL STENT;  Surgeon: Irine Seal, MD;  Location: [facility];  Service: Urology;  Laterality: Left;   CYSTOSCOPY WITH RETROGRADE PYELOGRAM, URETEROSCOPY AND STENT PLACEMENT Left 08/11/2016   Procedure: CYSTOSCOPY WITH RETROGRADE PYELOGRAM,  DIAGNOSTIC URETEROSCOPY , STENT EXCHANGE;  Surgeon: Alexis Frock, MD;  Location[facility];  Service: Urology;  Laterality: Left;   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   severe fibromatosis palm and fingers   LEFT HEART CATHETERIZATION WITH CORONARY ANGIOGRAM N/A 01/08/2014   Procedure: LEFT HEART CATHETERIZATION WITH CORONARY ANGIOGRAM;  Surgeon: Troy Sine, MD;  Location: [facility] CATH LAB;  Service: Cardiovascular;  Laterality:N/A;  total/ subtotal RCA/  mLAD 87-56% w/ mid systolic bridging/  preserved global LVF, ef 55%   MOHS SURGERY  x2  feb and may 2017   left ear /  left leg  ([facility])   PERCUTANEOUS CORONARY STENT INTERVENTION (PCI-S)  01/08/2014   Procedure: PERCUTANEOUS CORONARY STENT INTERVENTION (PCI-S);  Surgeon: Troy Sine, MD;  Location: [facility] CATH LAB;  Service: Cardiovascular;;  DES to mid and distal RCA   POSTERIOR LUMBAR FUSION   10/01/2003   and Laminectomy/ diskectomy  L4 -- S1   ROBOT ASSISTED PYELOPLASTY Left 09/15/2016   Procedure: XI ROBOTIC ASSISTED PYELOPLASTY;  Surgeon: Alexis Frock, MD;  Location: [facility];  Service: Urology;  Laterality: Left;   TONSILLECTOMY     TRANSTHORACIC ECHOCARDIOGRAM  02/23/2016   mild LVH,  ef 50-55%,  grade 1 diastolic dysfunction/  mild to moderate AV calcification w/ no stenosis or regurg./  trivial MR and TR/  mild PR   Family History  Problem Relation Age of Onset   Hypertension Mother    Heart disease Father    Colon cancer Neg Hx    Social History   Socioeconomic History   Marital status:  Married    Spouse name: Not on file   Number of children: 2   Years of education: Not on file   Highest education level: Not on file  Occupational History   Occupation: Retired    Fish farm manager: RETIRED  Tobacco Use   Smoking status: Former Smoker    Years: 10.00    Types: Cigarettes    Quit date: 08/09/1968    Years since quitting: 51.8   Smokeless tobacco: Never Used  Vaping Use   Vaping Use: Never used  Substance and Sexual Activity   Alcohol use: Yes    Alcohol/week: 2.0 standard drinks    Types: 2 Shots of liquor per week    Comment: daily   Drug use: No   Sexual activity: Yes    Partners: Female  Other Topics Concern   Not on file  Social History Narrative   1 Caffeine drink daily    Right handed       Lives with wife      Social Determinants of Health   Financial Resource Strain: Low Risk    Difficulty of Paying Living Expenses: Not hard at all  Food Insecurity: No Food Insecurity   Worried About Charity fundraiser in the Last Year: Never true   Arboriculturist in the Last Year: Never true  Transportation Needs: No Transportation Needs   Lack of Transportation (Medical): No   Lack of Transportation (Non-Medical): No  Physical Activity: Sufficiently Active   Days of Exercise per Week: 7 days   Minutes of Exercise per Session: 30 min  Stress: No  Stress Concern Present   Feeling of Stress : Not at all  Social Connections: Socially Integrated   Frequency of Communication with Friends and Family: More than three times a week   Frequency of Social Gatherings with Friends and Family: Once a week   Attends Religious Services: More than 4 times per year   Active Member of [employer] or Organizations: Yes   Attends Music therapist: More than 4 times per year   Marital Status: Married    Tobacco Counseling Counseling given: No   Clinical Intake:  Pre-visit preparation completed: Yes  Pain : No/denies pain Pain Score: 0-No pain     BMI - recorded: 25.74 Nutritional Status: BMI 25 -29 Overweight Nutritional Risks: None Diabetes: No  How often do you need to have someone help you when you read instructions, pamphlets, or other written materials from your doctor or pharmacy?: 1 - Never What is the last grade level you completed in school?: Bachelor's Degree  Diabetic? no  Interpreter Needed?: No  Information entered by :: Jamisen Duerson N. Nayara Taplin, LPN   Activities of Daily Living In your present state of health, do you have any difficulty performing the following activities: 06/11/2020  Hearing? Y  Comment wears hearing aids  Vision? N  Difficulty concentrating or making decisions? N  Walking or climbing stairs? N  Dressing or bathing? N  Doing errands, shopping? N  Preparing Food and eating ? N  Using the Toilet? N  In the past six months, have you accidently leaked urine? N  Do you have problems with loss of bowel control? N  Managing your Medications? N  Managing your Finances? N  Housekeeping or managing your Housekeeping? N  Some recent data might be hidden    Patient Care Team: Plotnikov, Evie Lacks, MD as PCP - General Sueanne Margarita, MD as PCP - Cardiology (Cardiology) Allred,  Jeneen Rinks, MD as PCP - Electrophysiology (Cardiology) Irene Shipper, MD (Gastroenterology) Thompson Grayer, MD  (Cardiology) Hennie Duos, MD as Consulting Physician (Rheumatology) Alexis Frock, MD as Consulting Physician (Urology) Katy Apo, MD as Consulting Physician (Ophthalmology) Mathis Fare (Dentistry) Tiajuana Amass, MD as Referring Physician (Allergy and Immunology) Cameron Sprang, MD as Consulting Physician (Neurology)  Indicate any recent Medical Services you may have received from other than [facility] providers in the past year (date may be approximate).     Assessment:   This is a routine wellness examination for [place].  Hearing/Vision screen No exam data present  Dietary issues and exercise activities discussed: Current Exercise Habits: Home exercise routine, Type of exercise: walking, Time (Minutes): 30, Frequency (Times/Week): 7, Weekly Exercise (Minutes/Week): 210, Exercise limited by: None identified  Goals       Patient Stated      Stay as healthy and as independent as possible. Travel, enjoy family and life.      Patient Stated      Limit alcohol consumption      patient states (pt-stated)      Maintain current weight by increasing activity and making healthy food choices.        Depression Screen PHQ 2/9 Scores 06/11/2020 11/23/2018 11/22/2017 11/18/2016 12/26/2014 04/19/2014  PHQ - 2 Score 0 0 1 0 0 0  PHQ- 9 Score - - 3 - - -  Exception Documentation - - - - Patient refusal -    Fall Risk Fall Risk  06/11/2020 01/04/2020 06/26/2019 04/10/2019 11/23/2018  Falls in the past year? 0 1 1 1 1   Number falls in past yr: 0 0 1 1 0  Comment - - - - -  Injury with Fall? 0 1 1 0 0  Risk for fall due to : No Fall Risks - Impaired balance/gait;Mental status change Impaired balance/gait;Impaired mobility;Impaired vision;Mental status change History of fall(s)  Follow up Falls evaluation completed - Falls evaluation completed - Falls evaluation completed;Falls prevention discussed;Education provided    Any stairs in or around the home? Yes  If so, are there any without handrails?  No  Home free of loose throw rugs in walkways, pet beds, electrical cords, etc? Yes  Adequate lighting in your home to reduce risk of falls? Yes   ASSISTIVE DEVICES UTILIZED TO PREVENT FALLS:  Life alert? No  Use of a cane, walker or w/c? No  Grab bars in the bathroom? Yes  Shower chair or bench in shower? Yes  Elevated toilet seat or a handicapped toilet? Yes   TIMED UP AND GO:  Was the test performed? No .  Length of time to ambulate 10 feet: 0 sec.   Gait steady and fast without use of assistive device  Cognitive Function: MMSE - Mini Mental State Exam 11/22/2017  Orientation to time 5  Orientation to Place 5  Registration 3  Attention/ Calculation 5  Recall 1  Language- name 2 objects 2  Language- repeat 1  Language- follow 3 step command 3  Language- read & follow direction 1  Write a sentence 1  Copy design 1  Total score 28   Montreal Cognitive Assessment  03/10/2020 04/16/2019  Visuospatial/ Executive (0/5) 1 3  Naming (0/3) 3 3  Attention: Read list of digits (0/2) 2 2  Attention: Read list of letters (0/1) 1 1  Attention: Serial 7 subtraction starting at 100 (0/3) 3 3  Language: Repeat phrase (0/2) 1 1  Language : Fluency (0/1) 1 1  Abstraction (0/2) 1 2  Delayed Recall (0/5) 0 2  Orientation (0/6) 3 6  Total 16 24  Adjusted Score (based on education) 16 -   6CIT Screen 06/11/2020  What Year? 0 points  What month? 0 points  What time? 0 points  Count back from 20 0 points  Months in reverse 0 points  Repeat phrase 0 points  Total Score 0    Immunizations Immunization History  Administered Date(s) Administered   Fluad Quad(high Dose 65+) 07/09/2019   Influenza Split 08/10/2011, 08/15/2012   Influenza Whole 08/21/2009, 09/22/2010   Influenza, High Dose Seasonal PF 09/16/2015, 08/18/2016, 08/14/2018   Influenza,inj,Quad PF,6+ Mos 08/06/2013   Influenza-Unspecified 08/31/2017   Pneumococcal Conjugate-13 10/02/2013   Pneumococcal Polysaccharide-23  06/04/2010   Tdap 10/22/2011   Zoster Recombinat (Shingrix) 03/09/2017, 05/10/2017    TDAP status: Up to date Flu Vaccine status: Up to date Pneumococcal vaccine status: Up to date Covid-19 vaccine status: Information provided on how to obtain vaccines.   Qualifies for Shingles Vaccine? Yes   Zostavax completed Yes   Shingrix Completed?: Yes  Screening Tests Health Maintenance  Topic Date Due   COVID-19 Vaccine (1) 06/27/2020 (Originally 08/21/1945)   INFLUENZA VACCINE  06/15/2020   TETANUS/TDAP  10/21/2021   PNA vac Low Risk Adult  Completed    Health Maintenance  There are no preventive care reminders to display for this patient.  Colorectal cancer screening: No longer required.   Lung Cancer Screening: (Low Dose CT Chest recommended if Age 17-80 years, 30 pack-year currently smoking OR have quit w/in 15years.) does not qualify.   Lung Cancer Screening Referral: no  Additional Screening:  Hepatitis C Screening: does not qualify; Completed no  Vision Screening: Recommended annual ophthalmology exams for early detection of glaucoma and other disorders of the eye. Is the patient up to date with their annual eye exam?  Yes  Who is the provider or what is the name of the office in which the patient attends annual eye exams? Katy Apo, MD If pt is not established with a provider, would they like to be referred to a provider to establish care? No .   Dental Screening: Recommended annual dental exams for proper oral hygiene  Community Resource Referral / Chronic Care Management: CRR required this visit?  No   CCM required this visit?  No      Plan:     I have personally reviewed and noted the following in the patient's chart:   Medical and social history Use of alcohol, tobacco or illicit drugs  Current medications and supplements Functional ability and status Nutritional status Physical activity Advanced directives List of other  physicians Hospitalizations, surgeries, and ER visits in previous 12 months Vitals Screenings to include cognitive, depression, and falls Referrals and appointments  In addition, I have reviewed and discussed with patient certain preventive protocols, quality metrics, and best practice recommendations. A written personalized care plan for preventive services as well as general preventive health recommendations were provided to patient.     Sheral Flow, LPN   3/79/0240   Nurse Notes: n/a  Medical screening examination/treatment/procedure(s) were performed by non-physician practitioner and as supervising physician I was immediately available for consultation/collaboration.  I agree with above. Lew Dawes, MD

## 2020-06-11 NOTE — Assessment & Plan Note (Signed)
Zetia, ASA, Toprol, Crestor

## 2020-06-11 NOTE — Assessment & Plan Note (Signed)
On Orensia q 30 d

## 2020-06-11 NOTE — Assessment & Plan Note (Signed)
BP Readings from Last 3 Encounters:  06/11/20 122/70  04/02/20 128/68  03/14/20 (!) 168/104

## 2020-06-11 NOTE — Assessment & Plan Note (Signed)
On Donepezil and Namenda

## 2020-06-11 NOTE — Patient Instructions (Addendum)
[name] , Thank you for taking time to come for your Medicare Wellness Visit. I appreciate your ongoing commitment to your health goals. Please review the following plan we discussed and let me know if I can assist you in the future.   Screening recommendations/referrals: Colonoscopy: no repeat due to age Recommended yearly ophthalmology/optometry visit for glaucoma screening and checkup Recommended yearly dental visit for hygiene and checkup  Vaccinations: Influenza vaccine: 07/09/2019 Pneumococcal vaccine: completed Tdap vaccine: 10/22/2011 Shingles vaccine: completed   Covid-19: never done  Advanced directives: Documents on file.  Conditions/risks identified: Yes; Please continue to do your personal lifestyle choices by: daily care of teeth and gums, regular physical activity (goal should be 5 days a week for 30 minutes), eat a healthy diet, avoid tobacco and drug use, limiting any alcohol intake, taking a low-dose aspirin (if not allergic or have been advised by your provider otherwise) and taking vitamins and minerals as recommended by your provider. Continue doing brain stimulating activities (puzzles, reading, adult coloring books, staying active) to keep memory sharp. Continue to eat heart healthy diet (full of fruits, vegetables, whole grains, lean protein, water--limit salt, fat, and sugar intake) and increase physical activity as tolerated.  Next appointment: Please schedule your next Medicare Wellness Visit with your Nurse Health Advisor in 1 year.  Preventive Care 84 Years and Older, Male Preventive care refers to lifestyle choices and visits with your health care provider that can promote health and wellness. What does preventive care include?  A yearly physical exam. This is also called an annual well check.  Dental exams once or twice a year.  Routine eye exams. Ask your health care provider how often you should have your eyes checked.  Personal lifestyle choices,  including:  Daily care of your teeth and gums.  Regular physical activity.  Eating a healthy diet.  Avoiding tobacco and drug use.  Limiting alcohol use.  Practicing safe sex.  Taking low doses of aspirin every day.  Taking vitamin and mineral supplements as recommended by your health care provider. What happens during an annual well check? The services and screenings done by your health care provider during your annual well check will depend on your age, overall health, lifestyle risk factors, and family history of disease. Counseling  Your health care provider may ask you questions about your:  Alcohol use.  Tobacco use.  Drug use.  Emotional well-being.  Home and relationship well-being.  Sexual activity.  Eating habits.  History of falls.  Memory and ability to understand (cognition).  Work and work Statistician. Screening  You may have the following tests or measurements:  Height, weight, and BMI.  Blood pressure.  Lipid and cholesterol levels. These may be checked every 5 years, or more frequently if you are over 45 years old.  Skin check.  Lung cancer screening. You may have this screening every year starting at age 79 if you have a 30-pack-year history of smoking and currently smoke or have quit within the past 15 years.  Fecal occult blood test (FOBT) of the stool. You may have this test every year starting at age 34.  Flexible sigmoidoscopy or colonoscopy. You may have a sigmoidoscopy every 5 years or a colonoscopy every 10 years starting at age 84.  Prostate cancer screening. Recommendations will vary depending on your family history and other risks.  Hepatitis C blood test.  Hepatitis B blood test.  Sexually transmitted disease (STD) testing.  Diabetes screening. This is done by checking  your blood sugar (glucose) after you have not eaten for a while (fasting). You may have this done every 1-3 years.  Abdominal aortic aneurysm (AAA)  screening. You may need this if you are a current or former smoker.  Osteoporosis. You may be screened starting at age 84 if you are at high risk. Talk with your health care provider about your test results, treatment options, and if necessary, the need for more tests. Vaccines  Your health care provider may recommend certain vaccines, such as:  Influenza vaccine. This is recommended every year.  Tetanus, diphtheria, and acellular pertussis (Tdap, Td) vaccine. You may need a Td booster every 10 years.  Zoster vaccine. You may need this after age 54.  Pneumococcal 13-valent conjugate (PCV13) vaccine. One dose is recommended after age 84.  Pneumococcal polysaccharide (PPSV23) vaccine. One dose is recommended after age 63. Talk to your health care provider about which screenings and vaccines you need and how often you need them. This information is not intended to replace advice given to you by your health care provider. Make sure you discuss any questions you have with your health care provider. Document Released: 11/28/2015 Document Revised: 07/21/2016 Document Reviewed: 09/02/2015 Elsevier Interactive Patient Education  2017 [place] Prevention in the Home Falls can cause injuries. They can happen to people of all ages. There are many things you can do to make your home safe and to help prevent falls. What can I do on the outside of my home?  Regularly fix the edges of walkways and driveways and fix any cracks.  Remove anything that might make you trip as you walk through a door, such as a raised step or threshold.  Trim any bushes or trees on the path to your home.  Use bright outdoor lighting.  Clear any walking paths of anything that might make someone trip, such as rocks or tools.  Regularly check to see if handrails are loose or broken. Make sure that both sides of any steps have handrails.  Any raised decks and porches should have guardrails on the  edges.  Have any leaves, snow, or ice cleared regularly.  Use sand or salt on walking paths during winter.  Clean up any spills in your garage right away. This includes oil or grease spills. What can I do in the bathroom?  Use night lights.  Install grab bars by the toilet and in the tub and shower. Do not use towel bars as grab bars.  Use non-skid mats or decals in the tub or shower.  If you need to sit down in the shower, use a plastic, non-slip stool.  Keep the floor dry. Clean up any water that spills on the floor as soon as it happens.  Remove soap buildup in the tub or shower regularly.  Attach bath mats securely with double-sided non-slip rug tape.  Do not have throw rugs and other things on the floor that can make you trip. What can I do in the bedroom?  Use night lights.  Make sure that you have a light by your bed that is easy to reach.  Do not use any sheets or blankets that are too big for your bed. They should not hang down onto the floor.  Have a firm chair that has side arms. You can use this for support while you get dressed.  Do not have throw rugs and other things on the floor that can make you trip. What can I do  in the kitchen?  Clean up any spills right away.  Avoid walking on wet floors.  Keep items that you use a lot in easy-to-reach places.  If you need to reach something above you, use a strong step stool that has a grab bar.  Keep electrical cords out of the way.  Do not use floor polish or wax that makes floors slippery. If you must use wax, use non-skid floor wax.  Do not have throw rugs and other things on the floor that can make you trip. What can I do with my stairs?  Do not leave any items on the stairs.  Make sure that there are handrails on both sides of the stairs and use them. Fix handrails that are broken or loose. Make sure that handrails are as long as the stairways.  Check any carpeting to make sure that it is firmly  attached to the stairs. Fix any carpet that is loose or worn.  Avoid having throw rugs at the top or bottom of the stairs. If you do have throw rugs, attach them to the floor with carpet tape.  Make sure that you have a light switch at the top of the stairs and the bottom of the stairs. If you do not have them, ask someone to add them for you. What else can I do to help prevent falls?  Wear shoes that:  Do not have high heels.  Have rubber bottoms.  Are comfortable and fit you well.  Are closed at the toe. Do not wear sandals.  If you use a stepladder:  Make sure that it is fully opened. Do not climb a closed stepladder.  Make sure that both sides of the stepladder are locked into place.  Ask someone to hold it for you, if possible.  Clearly mark and make sure that you can see:  Any grab bars or handrails.  First and last steps.  Where the edge of each step is.  Use tools that help you move around (mobility aids) if they are needed. These include:  Canes.  Walkers.  Scooters.  Crutches.  Turn on the lights when you go into a dark area. Replace any light bulbs as soon as they burn out.  Set up your furniture so you have a clear path. Avoid moving your furniture around.  If any of your floors are uneven, fix them.  If there are any pets around you, be aware of where they are.  Review your medicines with your doctor. Some medicines can make you feel dizzy. This can increase your chance of falling. Ask your doctor what other things that you can do to help prevent falls. This information is not intended to replace advice given to you by your health care provider. Make sure you discuss any questions you have with your health care provider. Document Released: 08/28/2009 Document Revised: 04/08/2016 Document Reviewed: 12/06/2014 Elsevier Interactive Patient Education  2017 [employer].

## 2020-06-11 NOTE — Progress Notes (Signed)
Subjective:  Patient ID: [name], male    DOB: 13-Aug-1933  Age: 86 y.o. MRN: [id number]  CC: No chief complaint on file.   HPI [name] presents for CAD, RA, memory loss, anticoagulation f/u. In [facility] - Jan - Feb  Outpatient Medications Prior to Visit  Medication Sig Dispense Refill  . acetaminophen (TYLENOL) 500 MG tablet Take 1,000 mg by mouth every 6 (six) hours as needed.    [name] acidophilus (RISAQUAD) CAPS capsule Take 1 capsule by mouth daily.    [name] aspirin EC 81 MG tablet Take 81 mg by mouth daily.    [name] donepezil (ARICEPT) 10 MG tablet Take 5 mg by mouth at bedtime.    [name] ELIQUIS 5 MG TABS tablet TAKE ONE TABLET TWICE DAILY 180 tablet 1  . escitalopram (LEXAPRO) 20 MG tablet Take 1 tablet (20 mg total) by mouth daily. 90 tablet 3  . ezetimibe (ZETIA) 10 MG tablet TAKE ONE-HALF TABLET DAILY 45 tablet 3  . lisinopril (ZESTRIL) 10 MG tablet Take 1 tablet (10 mg total) by mouth daily. 90 tablet 3  . memantine (NAMENDA) 10 MG tablet Take 1 tablet at night for 2 weeks, then increase to 1 tablet twice a day 60 tablet 11  . metoprolol tartrate (LOPRESSOR) 25 MG tablet TAKE ONE TABLET TWICE DAILY 180 tablet 2  . nitroGLYCERIN (NITROSTAT) 0.4 MG SL tablet ONE TABLET UNDER TONGUE AS NEEDED FOR CHEST PAIN. MAY REPEAT IN 5 MINUTES AND AGAIN IN 5 MINUTES (TOTAL OF 3 TABLETS) 25 tablet 6  . omeprazole (PRILOSEC) 40 MG capsule TAKE ONE CAPSULE EACH DAY 90 capsule 3  . Propylene Glycol (SYSTANE BALANCE OP) Place 2 drops into both eyes daily as needed (dry eyes).     [name] REPATHA SURECLICK 952 MG/ML SOAJ INJECT 1 PEN INTO SKIN EVERY 14 DAYS 2 mL 11  . rosuvastatin (CRESTOR) 5 MG tablet Take 1 tablet (5 mg total) by mouth daily. 90 tablet 3  . zolpidem (AMBIEN) 10 MG tablet TAKE 1/2 TO 1 TABLET AT BEDTIME AS NEEDED FOR SLEEP 30 tablet 3   No facility-administered medications prior to visit.    ROS: Review of Systems  Constitutional: Negative for appetite change, fatigue and unexpected weight  change.  HENT: Negative for congestion, nosebleeds, sneezing, sore throat and trouble swallowing.   Eyes: Negative for itching and visual disturbance.  Respiratory: Negative for cough.   Cardiovascular: Negative for chest pain, palpitations and leg swelling.  Gastrointestinal: Negative for abdominal distention, blood in stool, diarrhea and nausea.  Genitourinary: Negative for frequency and hematuria.  Musculoskeletal: Positive for arthralgias and back pain. Negative for gait problem, joint swelling and neck pain.  Skin: Negative for rash.  Neurological: Negative for dizziness, tremors, speech difficulty and weakness.  Psychiatric/Behavioral: Positive for decreased concentration. Negative for agitation, dysphoric mood and sleep disturbance. The patient is not nervous/anxious.     Objective:  There were no vitals taken for this visit.  BP Readings from Last 3 Encounters:  06/11/20 122/70  04/02/20 128/68  03/14/20 (!) 168/104    Wt Readings from Last 3 Encounters:  06/11/20 189 lb 12.8 oz (86.1 kg)  04/09/20 190 lb (86.2 kg)  04/02/20 190 lb 9.6 oz (86.5 kg)    Physical Exam Constitutional:      General: He is not in acute distress.    Appearance: He is well-developed.     Comments: NAD  Eyes:     Conjunctiva/sclera: Conjunctivae normal.  Pupils: Pupils are equal, round, and reactive to light.  Neck:     Thyroid: No thyromegaly.     Vascular: No JVD.  Cardiovascular:     Rate and Rhythm: Normal rate and regular rhythm.     Heart sounds: Normal heart sounds. No murmur heard.  No friction rub. No gallop.   Pulmonary:     Effort: Pulmonary effort is normal. No respiratory distress.     Breath sounds: Normal breath sounds. No wheezing or rales.  Chest:     Chest wall: No tenderness.  Abdominal:     General: Bowel sounds are normal. There is no distension.     Palpations: Abdomen is soft. There is no mass.     Tenderness: There is no abdominal tenderness. There is no  guarding or rebound.  Musculoskeletal:        General: Tenderness present. Normal range of motion.     Cervical back: Normal range of motion.  Lymphadenopathy:     Cervical: No cervical adenopathy.  Skin:    General: Skin is warm and dry.     Findings: No rash.  Neurological:     Mental Status: He is alert and oriented to person, place, and time.     Cranial Nerves: No cranial nerve deficit.     Motor: No abnormal muscle tone.     Coordination: Coordination normal.     Gait: Gait normal.     Deep Tendon Reflexes: Reflexes are normal and symmetric.  Psychiatric:        Behavior: Behavior normal.        Thought Content: Thought content normal.        Judgment: Judgment normal.   LS back w/pain  Lab Results  Component Value Date   WBC 8.6 03/14/2020   HGB 13.8 03/14/2020   HCT 42.3 03/14/2020   PLT 299 03/14/2020   GLUCOSE 115 (H) 03/14/2020   CHOL 203 (H) 03/03/2020   TRIG 275 (H) 03/03/2020   HDL 64 03/03/2020   LDLDIRECT 168.9 05/24/2013   LDLCALC 93 03/03/2020   ALT 11 03/03/2020   AST 13 03/03/2020   NA 141 03/14/2020   K 3.7 03/14/2020   CL 105 03/14/2020   CREATININE 0.93 03/14/2020   BUN 15 03/14/2020   CO2 26 03/14/2020   TSH 1.18 12/13/2017   PSA 2.46 02/21/2017   INR 0.92 09/13/2016   HGBA1C 6.4 08/30/2017    DG Chest 2 View  Result Date: 03/14/2020 CLINICAL DATA:  Central chest pain EXAM: CHEST - 2 VIEW COMPARISON:  05/13/2016 FINDINGS: The heart size and mediastinal contours are within normal limits. Both lungs are clear. Disc degenerative disease of the thoracic spine. IMPRESSION: No acute abnormality of the lungs. Electronically Signed   By: Eddie Candle M.D.   On: 03/14/2020 11:12    Assessment & Plan:    [name], MD

## 2020-06-16 ENCOUNTER — Other Ambulatory Visit: Payer: Self-pay

## 2020-06-16 ENCOUNTER — Ambulatory Visit (HOSPITAL_BASED_OUTPATIENT_CLINIC_OR_DEPARTMENT_OTHER): Payer: Medicare Other | Attending: Cardiology | Admitting: Cardiology

## 2020-06-16 DIAGNOSIS — G4733 Obstructive sleep apnea (adult) (pediatric): Secondary | ICD-10-CM | POA: Diagnosis not present

## 2020-06-16 DIAGNOSIS — R0902 Hypoxemia: Secondary | ICD-10-CM | POA: Insufficient documentation

## 2020-06-16 DIAGNOSIS — R0683 Snoring: Secondary | ICD-10-CM | POA: Diagnosis not present

## 2020-06-16 DIAGNOSIS — Z9989 Dependence on other enabling machines and devices: Secondary | ICD-10-CM | POA: Diagnosis not present

## 2020-06-19 NOTE — Procedures (Signed)
   Patient Name: [name], [name] Date: 06/17/2020 Gender: Male D.O.B: 07/24/1933 Age (years): 71 Referring Provider: Thompson Grayer Height (inches): 72 Interpreting Physician: Fransico Him MD, ABSM Weight (lbs): 190 RPSGT: Jacolyn Reedy BMI: 26 MRN: [id number] Neck Size: 16.50  CLINICAL INFORMATION Sleep Study Type: HST  Indication for sleep study: N/A  Epworth Sleepiness Score: 6  SLEEP STUDY TECHNIQUE A multi-channel overnight portable sleep study was performed. The channels recorded were: nasal airflow, thoracic respiratory movement, and oxygen saturation with a pulse oximetry. Snoring was also monitored.  MEDICATIONS Patient self administered medications include: N/A.  SLEEP ARCHITECTURE Patient was studied for 446 minutes. The sleep efficiency was 100.0 % and the patient was supine for 70.5%. The arousal index was 0.0 per hour.  RESPIRATORY PARAMETERS The overall AHI was 47.6 per hour, with a central apnea index of 0.0 per hour.  The oxygen nadir was 78% during sleep.  CARDIAC DATA Mean heart rate during sleep was 75.6 bpm.  IMPRESSIONS - Severe obstructive sleep apnea occurred during this study (AHI = 47.6/h). - No significant central sleep apnea occurred during this study (CAI = 0.0/h). - Severe oxygen desaturation was noted during this study (Min O2 = 78%). - Patient snored 34.0% during the sleep.  DIAGNOSIS - Obstructive Sleep Apnea (G47.33) - Nocturnal Hypoxemia (G47.36)  RECOMMENDATIONS - Recommend in lab CPAP titration due to severity of OSA and hypoxemia. - Positional therapy avoiding supine position during sleep. - Avoid alcohol, sedatives and other CNS depressants that may worsen sleep apnea and disrupt normal sleep architecture. - Sleep hygiene should be reviewed to assess factors that may improve sleep quality. - Weight management and regular exercise should be initiated or continued.  [Electronically signed] 06/19/2020 12:01 PM  Fransico Him MD, [place], American Board of Sleep Medicine   NPI: [id number]

## 2020-06-23 ENCOUNTER — Telehealth: Payer: Self-pay | Admitting: *Deleted

## 2020-06-23 DIAGNOSIS — Z9989 Dependence on other enabling machines and devices: Secondary | ICD-10-CM

## 2020-06-23 DIAGNOSIS — G4733 Obstructive sleep apnea (adult) (pediatric): Secondary | ICD-10-CM

## 2020-06-23 NOTE — Telephone Encounter (Addendum)
Informed patient of sleep study results and patient understanding was verbalized. Patient understands his sleep study showed they have sleep apnea and recommend CPAP titration. Please set up titration in the sleep lab.   Left message on voicemail and informed patient to call back. Titration scheduled

## 2020-06-23 NOTE — Telephone Encounter (Signed)
-----   Message from Sueanne Margarita, MD sent at 06/19/2020 12:02 PM EDT ----- Please let patient know that they have sleep apnea and recommend CPAP titration. Please set up titration in the sleep lab.

## 2020-06-24 DIAGNOSIS — M0609 Rheumatoid arthritis without rheumatoid factor, multiple sites: Secondary | ICD-10-CM | POA: Diagnosis not present

## 2020-06-24 DIAGNOSIS — Z79899 Other long term (current) drug therapy: Secondary | ICD-10-CM | POA: Diagnosis not present

## 2020-07-01 DIAGNOSIS — E663 Overweight: Secondary | ICD-10-CM | POA: Diagnosis not present

## 2020-07-01 DIAGNOSIS — M0609 Rheumatoid arthritis without rheumatoid factor, multiple sites: Secondary | ICD-10-CM | POA: Diagnosis not present

## 2020-07-01 DIAGNOSIS — M5030 Other cervical disc degeneration, unspecified cervical region: Secondary | ICD-10-CM | POA: Diagnosis not present

## 2020-07-02 ENCOUNTER — Telehealth: Payer: Self-pay | Admitting: *Deleted

## 2020-07-02 NOTE — Telephone Encounter (Signed)
Staff message sent to Nina ok to schedule sleep study. Patient has Medicare and does not require a PA. 

## 2020-07-07 NOTE — Addendum Note (Signed)
Addended by: Freada Bergeron on: 07/07/2020 12:19 PM   Modules accepted: Orders

## 2020-07-07 NOTE — Telephone Encounter (Signed)
Patient is scheduled for CPAP Titration on 08/10/20. Patient understands his titration study will be done at [facility] sleep lab. Patient understands he will receive a letter in a week or so detailing appointment, date, time, and location. Patient understands to call if he does not receive the letter  in a timely manner. Left detailed message on voicemail with date and time of titration and informed patient to call back to confirm or reschedule.

## 2020-07-07 NOTE — Telephone Encounter (Signed)
[name], CMA  Freada Bergeron, CMA Ok to schedule. Patient has Medicare, does not require a PA.          ----- Message -----  From: Freada Bergeron, CMA  Sent: 07/02/2020  1:09 PM EDT  To: [facility]  Subject: PRECERT                      CPAP Titration

## 2020-07-10 ENCOUNTER — Other Ambulatory Visit: Payer: Self-pay | Admitting: Internal Medicine

## 2020-07-23 DIAGNOSIS — M0609 Rheumatoid arthritis without rheumatoid factor, multiple sites: Secondary | ICD-10-CM | POA: Diagnosis not present

## 2020-07-25 ENCOUNTER — Ambulatory Visit: Payer: Medicare Other | Admitting: Cardiology

## 2020-07-30 DIAGNOSIS — L821 Other seborrheic keratosis: Secondary | ICD-10-CM | POA: Diagnosis not present

## 2020-07-30 DIAGNOSIS — B351 Tinea unguium: Secondary | ICD-10-CM | POA: Diagnosis not present

## 2020-07-30 DIAGNOSIS — L812 Freckles: Secondary | ICD-10-CM | POA: Diagnosis not present

## 2020-07-30 DIAGNOSIS — L570 Actinic keratosis: Secondary | ICD-10-CM | POA: Diagnosis not present

## 2020-07-30 DIAGNOSIS — Z85828 Personal history of other malignant neoplasm of skin: Secondary | ICD-10-CM | POA: Diagnosis not present

## 2020-07-30 DIAGNOSIS — C4441 Basal cell carcinoma of skin of scalp and neck: Secondary | ICD-10-CM | POA: Diagnosis not present

## 2020-07-30 DIAGNOSIS — D1801 Hemangioma of skin and subcutaneous tissue: Secondary | ICD-10-CM | POA: Diagnosis not present

## 2020-07-30 DIAGNOSIS — D485 Neoplasm of uncertain behavior of skin: Secondary | ICD-10-CM | POA: Diagnosis not present

## 2020-08-04 ENCOUNTER — Other Ambulatory Visit: Payer: Self-pay

## 2020-08-04 ENCOUNTER — Other Ambulatory Visit: Payer: Medicare Other | Admitting: *Deleted

## 2020-08-04 DIAGNOSIS — I10 Essential (primary) hypertension: Secondary | ICD-10-CM | POA: Diagnosis not present

## 2020-08-04 DIAGNOSIS — E782 Mixed hyperlipidemia: Secondary | ICD-10-CM

## 2020-08-04 LAB — LIPID PANEL
Chol/HDL Ratio: 4.2 ratio (ref 0.0–5.0)
Cholesterol, Total: 197 mg/dL (ref 100–199)
HDL: 47 mg/dL (ref >39)
LDL Chol Calc (NIH): 106 mg/dL — ABNORMAL HIGH (ref 0–99)
Triglycerides: 254 mg/dL — ABNORMAL HIGH (ref 0–149)
VLDL Cholesterol Cal: 44 mg/dL — ABNORMAL HIGH (ref 5–40)

## 2020-08-10 ENCOUNTER — Encounter (HOSPITAL_BASED_OUTPATIENT_CLINIC_OR_DEPARTMENT_OTHER): Payer: Medicare Other | Admitting: Cardiology

## 2020-08-12 ENCOUNTER — Other Ambulatory Visit: Payer: Self-pay | Admitting: Internal Medicine

## 2020-08-12 ENCOUNTER — Ambulatory Visit: Payer: Medicare Other | Attending: Internal Medicine

## 2020-08-12 DIAGNOSIS — Z23 Encounter for immunization: Secondary | ICD-10-CM

## 2020-08-12 NOTE — Progress Notes (Signed)
   Covid-19 Vaccination Clinic  Name:  LEEAM CEDRONE    MRN: [id number] DOB: 05/23/1933  08/12/2020  Mr. Clausing was observed post Covid-19 immunization for 15 minutes without incident. He was provided with Vaccine Information Sheet and instruction to access the V-Safe system.   Mr. Balling was instructed to call 911 with any severe reactions post vaccine: [name] Difficulty breathing  . Swelling of face and throat  . A fast heartbeat  . A bad rash all over body  . Dizziness and weakness

## 2020-08-18 ENCOUNTER — Other Ambulatory Visit: Payer: Self-pay | Admitting: Cardiology

## 2020-08-18 ENCOUNTER — Other Ambulatory Visit: Payer: Self-pay

## 2020-08-18 ENCOUNTER — Ambulatory Visit (INDEPENDENT_AMBULATORY_CARE_PROVIDER_SITE_OTHER): Payer: Medicare Other | Admitting: Neurology

## 2020-08-18 ENCOUNTER — Encounter: Payer: Self-pay | Admitting: Neurology

## 2020-08-18 VITALS — BP 122/81 | HR 84 | Resp 18 | Ht 72.0 in | Wt 188.0 lb

## 2020-08-18 DIAGNOSIS — I2510 Atherosclerotic heart disease of native coronary artery without angina pectoris: Secondary | ICD-10-CM | POA: Diagnosis not present

## 2020-08-18 DIAGNOSIS — F0150 Vascular dementia without behavioral disturbance: Secondary | ICD-10-CM | POA: Diagnosis not present

## 2020-08-18 MED ORDER — ESCITALOPRAM OXALATE 20 MG PO TABS: ORAL_TABLET | ORAL | 3 refills | Status: DC

## 2020-08-18 MED ORDER — DONEPEZIL HCL 10 MG PO TABS: ORAL_TABLET | ORAL | 3 refills | Status: DC

## 2020-08-18 MED ORDER — MEMANTINE HCL 10 MG PO TABS: ORAL_TABLET | ORAL | 3 refills | Status: DC

## 2020-08-18 NOTE — Patient Instructions (Signed)
Good to see you!  1. Increase Lexapro (Escitalopram) 20mg : take 1 and 1/2 tablets daily  2. Continue Donepezil (Aricept) 10mg : take 1/2 tablet daily and Memantine (Namenda) 10mg : Take 1 tablet twice a day  3. Strongly recommend a driving evaluation: There are 2 places you can contact  [employer] in [place] or [employer] [phone].  4. Follow-up in 6 months, call for any changes   FALL PRECAUTIONS: Be cautious when walking. Scan the area for obstacles that may increase the risk of trips and falls. When getting up in the mornings, sit up at the edge of the bed for a few minutes before getting out of bed. Consider elevating the bed at the head end to avoid drop of blood pressure when getting up. Walk always in a well-lit room (use night lights in the walls). Avoid area rugs or power cords from appliances in the middle of the walkways. Use a walker or a cane if necessary and consider physical therapy for balance exercise. Get your eyesight checked regularly.  FINANCIAL OVERSIGHT: Supervision, especially oversight when making financial decisions or transactions is also recommended.  HOME SAFETY: Consider the safety of the kitchen when operating appliances like stoves, microwave oven, and blender. Consider having supervision and share cooking responsibilities until no longer able to participate in those. Accidents with firearms and other hazards in the house should be identified and addressed as well.  DRIVING: Regarding driving, in patients with progressive memory problems, driving will be impaired. We advise to have someone else do the driving if trouble finding directions or if minor accidents are reported. Independent driving assessment is available to determine safety of driving.  ABILITY TO BE LEFT ALONE: If patient is unable to contact 911 operator, consider using Life[employer], or when the need is there, arrange for someone to stay with patients.  Smoking is a fire hazard, consider supervision or cessation. Risk of wandering should be assessed by caregiver and if detected at any point, supervision and safe proof recommendations should be instituted.  MEDICATION SUPERVISION: Inability to self-administer medication needs to be constantly addressed. Implement a mechanism to ensure safe administration of the medications.  RECOMMENDATIONS FOR ALL PATIENTS WITH MEMORY PROBLEMS: 1. Continue to exercise (Recommend 30 minutes of walking everyday, or 3 hours every week) 2. Increase social interactions - continue going to [place] and enjoy social gatherings with friends and family 3. Eat healthy, avoid fried foods and eat more fruits and vegetables 4. Maintain adequate blood pressure, blood sugar, and blood cholesterol level. Reducing the risk of stroke and cardiovascular disease also helps promoting better memory. 5. Avoid stressful situations. Live a simple life and avoid aggravations. Organize your time and prepare for the next day in anticipation. 6. Sleep well, avoid any interruptions of sleep and avoid any distractions in the bedroom that may interfere with adequate sleep quality 7. Avoid sugar, avoid sweets as there is a strong link between excessive sugar intake, diabetes, and cognitive impairment The Mediterranean diet has been shown to help patients reduce the risk of progressive memory disorders and reduces cardiovascular risk. This includes eating fish, eat fruits and green leafy vegetables, nuts like almonds and hazelnuts, walnuts, and also use olive oil. Avoid fast foods and fried foods as much as possible. Avoid sweets and sugar as sugar use has been linked to worsening of memory function.  There is always a concern of gradual progression of memory problems. If this is the case, then we may need to adjust  level of care according to patient needs. Support, both to the patient and caregiver, should then be put into place.

## 2020-08-18 NOTE — Progress Notes (Signed)
NEUROLOGY FOLLOW UP OFFICE NOTE  [name] [id number] 1933-07-14  HISTORY OF PRESENT ILLNESS: I had the pleasure of seeing [name] in follow-up in the neurology clinic on 08/18/2020.  The patient was last seen 8 months ago for memory loss. He is again accompanied by his wife who helps supplement the history today.  Records and images were personally reviewed where available. He underwent Neuropsychological evaluation in April 2021 with a diagnosis of mild dementia, mixed vascular and Alzheimer's disease, with behavioral disturbance (depression). There were memory storage problems, difficulties with visual object naming, and reduced semantic fluency (AD), as well as processing speed and executive function (vascular). CT head showed moderate to severe chronic microvascular disease and diffuse atrophy, possibly more in the mesial temporal regions. He is on Memantine 10mg  BID, Donepezil 5mg  daily, and Lexapro 20mg  daily. His wife administers medications and manages finances. He reports she does most of the driving (she shakes head), and states he does fine with short distance driving. He denies getting lost. He states he is aware he has short-term memory problems but feels fine overall. He denies any headaches, dizziness, focal numbness/tingling/weakness, no falls. Sleep is good, but he needs to take prn Ambien with melatonin. His wife reports he gets frustrated and short sometimes, and in all their years of marriage, they have never argued as much as now. No paranoia or hallucinations.    History on Initial Assessment 04/11/2019: This is an 86 year old right-handed man with a history of hypertension, hyperlipidemia, OSA on CPAP, CAD, presenting for evaluation of worsening memory. He feels his memory is okay, he gets confused some, "my wife tells me I forget things." He makes jokes several times during the visit. His wife started noticing changes the beginning of the year. He started having personality  changes where he would just explode for no reason. He was started on Lexapro which has significantly helped. She has noticed short term memory issues, and was missing bills that she had to take over in January. He was also starting to miss medications, his wife has started helping. Last night he was very concerned that he had not taken his medications. He has gotten lost driving, last week he could not remember what errand he went to do and was driving around. He could not remember where he would be going, but then figures it out. He repeatedly says he misplaced his hearing aids. His wife was driving yesterday and they brought her car to be serviced, after going to another place, he could not remember that they had brought her car in earlier. He had an unusual episode last week when he came upstairs and told his wife that they had to go and leave now, he was dressed in 2 pairs of boxer shorts and his shirt on backward (unusual, he is usually a fastidious dresser per wife), ready to go somewhere late at night. She found that he had pulled out his clothes and medications and wrapped his medications in his khaki pants. She directed him to bed and he went to sleep. He saw his PCP and had normal bloodwork and urinalysis. I personally reviewed head CT without contrast done 5/26 which did not show any acute changes, there was diffuse atrophy and chronic microvascular disease. His wife notes he is much more alert in the daytime and more confused at night. He was started on Donepezil 5mg  daily, which he is tolerating without side effects. There is no family history of dementia, no  history of significant head injuries. He usually has 1-2 alcohol drinks at night.   He denies any headaches, dizziness (except upon standing quickly), diplopia, dysarthria/dysphagia, neck/back pain, focal numbness/tingling/weakness, bowel dysfunction, anosmia. He has infrequent incontinence. He has had right hand shaking occasionally around 3-4  times a week. He has a history of sleepwalking. His wife has not noticed any staring/unresponsive episodes. No paranoia or hallucinations. He has trouble sleeping and takes 1/2 tablet of Ambien. He has fallen a coupld of times, last fall was 3 weeks ago.   PAST MEDICAL HISTORY: Past Medical History:  Diagnosis Date  . CAD (coronary artery disease)    a. s/p inferior STEMI 01/08/2014; LHC 01/08/14: total RCA occlusion s/p 3.5x26mm Xience DES distal RCA and 3.5x28 mm DES mid RCA, 60-70% mid LAD stenosis, EF 55%.  . Complication of anesthesia    'long to wake up after back surgery " 09/2003  . Diverticulosis of colon   . GERD (gastroesophageal reflux disease)   . History of cellulitis    05-26-2015  LLE  . History of colon polyps    1998- benign/  2008 adenomatous   . History of kidney stones    2013  . History of squamous cell carcinoma excision    2013;  2015;  06-12-2015 right leg/  02/ and 05/ 2017  left ear and left leg  . Hydronephrosis, left   . Hyperlipidemia   . Hypertension   . Migraine with aura   . OSA on CPAP 06/19/2015   Moderate OSA with AHI 18/hr  per study 05-20-2015  . Osteoarthritis   . Paroxysmal atrial fibrillation ([place]) 4/16   chads2vasc score is at least 4  . Premature atrial contractions   . RA (rheumatoid arthritis) [facility])    rheumatologist-  dr [name]  . Sinusitis, chronic 10/17/2015  . Wears glasses   . Wears hearing aid    bilateral    MEDICATIONS: Current Outpatient Medications on File Prior to Visit  Medication Sig Dispense Refill  . acetaminophen (TYLENOL) 500 MG tablet Take 1,000 mg by mouth every 6 (six) hours as needed.    [name] acidophilus (RISAQUAD) CAPS capsule Take 1 capsule by mouth daily.    [name] aspirin EC 81 MG tablet Take 81 mg by mouth daily.    [name] donepezil (ARICEPT) 10 MG tablet Take 5 mg by mouth at bedtime.    [name] ELIQUIS 5 MG TABS tablet TAKE ONE TABLET TWICE DAILY 180 tablet 1  . escitalopram (LEXAPRO) 20 MG tablet Take 1 tablet (20  mg total) by mouth daily. 90 tablet 3  . ezetimibe (ZETIA) 10 MG tablet TAKE ONE-HALF TABLET DAILY 45 tablet 3  . lisinopril (ZESTRIL) 10 MG tablet Take 1 tablet (10 mg total) by mouth daily. 90 tablet 3  . memantine (NAMENDA) 10 MG tablet Take 1 tablet at night for 2 weeks, then increase to 1 tablet twice a day 60 tablet 11  . metoprolol tartrate (LOPRESSOR) 25 MG tablet TAKE ONE TABLET TWICE DAILY 180 tablet 2  . nitroGLYCERIN (NITROSTAT) 0.4 MG SL tablet ONE TABLET UNDER TONGUE AS NEEDED FOR CHEST PAIN. MAY REPEAT IN 5 MINUTES AND AGAIN IN 5 MINUTES (TOTAL OF 3 TABLETS) 25 tablet 6  . omeprazole (PRILOSEC) 40 MG capsule TAKE ONE CAPSULE EACH DAY 90 capsule 3  . oxyCODONE-acetaminophen (PERCOCET/ROXICET) 5-325 MG tablet TAKE ONE TABLET EVERY EIGHT HOURS AS NEEDED FOR SEVERE PAIN 90 tablet 0  . Propylene Glycol (SYSTANE BALANCE OP) Place 2 drops  into both eyes daily as needed (dry eyes).     [name] REPATHA SURECLICK 503 MG/ML SOAJ INJECT 1 PEN INTO SKIN EVERY 14 DAYS 2 mL 11  . rosuvastatin (CRESTOR) 5 MG tablet Take 1 tablet (5 mg total) by mouth daily. 90 tablet 3  . zolpidem (AMBIEN) 10 MG tablet TAKE 1/2 TO 1 TABLET AT BEDTIME AS NEEDED FOR SLEEP 30 tablet 5   No current facility-administered medications on file prior to visit.    ALLERGIES: Allergies  Allergen Reactions  . Methotrexate Other (See Comments)    REACTION: tachycardia  . Aricept [Donepezil Hcl]     nightmares   . Crestor [Rosuvastatin Calcium]     Myalgias with 20mg  dose  . Lisinopril Other (See Comments)    cough  . Atorvastatin Other (See Comments)    Muscle pain, leg cramps  . Pravastatin Sodium Other (See Comments)    REACTION: cramps, fatigue    FAMILY HISTORY: Family History  Problem Relation Age of Onset  . Hypertension Mother   . Heart disease Father   . Colon cancer Neg Hx     SOCIAL HISTORY: Social History   Socioeconomic History  . Marital status: Married    Spouse name: Not on file  . Number of  children: 2  . Years of education: Not on file  . Highest education level: Not on file  Occupational History  . Occupation: Retired    Fish farm manager: RETIRED  Tobacco Use  . Smoking status: Former Smoker    Years: 10.00    Types: Cigarettes    Quit date: 08/09/1968    Years since quitting: 52.0  . Smokeless tobacco: Never Used  Vaping Use  . Vaping Use: Never used  Substance and Sexual Activity  . Alcohol use: Yes    Alcohol/week: 2.0 standard drinks    Types: 2 Shots of liquor per week    Comment: daily  . Drug use: No  . Sexual activity: Yes    Partners: Female  Other Topics Concern  . Not on file  Social History Narrative   1 Caffeine drink daily    Right handed       Lives with wife      Social Determinants of Health   Financial Resource Strain: Low Risk   . Difficulty of Paying Living Expenses: Not hard at all  Food Insecurity: No Food Insecurity  . Worried About Charity fundraiser in the Last Year: Never true  . Ran Out of Food in the Last Year: Never true  Transportation Needs: No Transportation Needs  . Lack of Transportation (Medical): No  . Lack of Transportation (Non-Medical): No  Physical Activity: Sufficiently Active  . Days of Exercise per Week: 7 days  . Minutes of Exercise per Session: 30 min  Stress: No Stress Concern Present  . Feeling of Stress : Not at all  Social Connections: Socially Integrated  . Frequency of Communication with Friends and Family: More than three times a week  . Frequency of Social Gatherings with Friends and Family: Once a week  . Attends Religious Services: More than 4 times per year  . Active Member of Clubs or Organizations: Yes  . Attends Archivist Meetings: More than 4 times per year  . Marital Status: Married  Human resources officer Violence: Not At Risk  . Fear of Current or Ex-Partner: No  . Emotionally Abused: No  . Physically Abused: No  . Sexually Abused: No     PHYSICAL  EXAM: Vitals:   08/18/20 0819   BP: 122/81  Pulse: 84  Resp: 18  SpO2: 96%   General: No acute distress Head:  Normocephalic/atraumatic Skin/Extremities: No rash, no edema Neurological Exam: awake and alert. No aphasia or dysarthria. Fund of knowledge is appropriate. Attention and concentration are normal.   Cranial nerves: Pupils equal, round. Extraocular movements intact.  No facial asymmetry.  Motor: moves all extremities symmetrically. Gait narrow-based and steady, no ataxia   IMPRESSION: This is a pleasant 86 yo RH man with a history of hypertension, hyperlipidemia, OSA on CPAP, CAD, with mild dementia, mixed vascular and Alzheimer's disease with behavioral disturbance. Neuropsychological testing results discussed today, we discussed diagnosis, prognosis, and management. Continue Donepezil 5mg  daily (had side effects on higher dose) and Memantine 10mg  BID. He is having more mood changes, increase Lexapro to 30mg  daily. We had an extensive discussion regarding driving and dementia, recommend driving evaluation. We discussed the importance of control of vascular risk factors, physical exercise, and brain stimulation exercises. Follow-up in 6 months, they know to call for any changes.    Thank you for allowing me to participate in his care.  Please do not hesitate to call for any questions or concerns.   Ellouise Newer, M.D.   CC: Dr. [name]

## 2020-08-18 NOTE — Telephone Encounter (Signed)
Eliquis 5mg  refill request received. Patient is 86 years old, weight-85.3kg, Crea-0.93 on 03/14/20, Diagnosis-Afib, and last seen by Dr. Radford Pax on 04/02/2020. Dose is appropriate based on dosing criteria. Will send in refill to requested pharmacy.

## 2020-08-19 ENCOUNTER — Ambulatory Visit: Payer: Medicare Other | Admitting: Cardiology

## 2020-08-21 DIAGNOSIS — M0609 Rheumatoid arthritis without rheumatoid factor, multiple sites: Secondary | ICD-10-CM | POA: Diagnosis not present

## 2020-08-27 DIAGNOSIS — Z23 Encounter for immunization: Secondary | ICD-10-CM | POA: Diagnosis not present

## 2020-08-31 ENCOUNTER — Encounter (HOSPITAL_COMMUNITY): Payer: Self-pay | Admitting: Emergency Medicine

## 2020-08-31 ENCOUNTER — Other Ambulatory Visit: Payer: Self-pay

## 2020-08-31 ENCOUNTER — Ambulatory Visit (HOSPITAL_COMMUNITY)
Admission: EM | Admit: 2020-08-31 | Discharge: 2020-08-31 | Disposition: A | Discharge status: Nursing Home (Includes ICF) | Payer: Medicare Other | Attending: Emergency Medicine | Admitting: Emergency Medicine

## 2020-08-31 ENCOUNTER — Emergency Department (HOSPITAL_COMMUNITY)
Admission: EM | Admit: 2020-08-31 | Discharge: 2020-08-31 | Disposition: A | Discharge status: Home / Self Care | Payer: Medicare Other | Attending: Emergency Medicine | Admitting: Emergency Medicine

## 2020-08-31 ENCOUNTER — Encounter (HOSPITAL_COMMUNITY): Payer: Self-pay | Admitting: *Deleted

## 2020-08-31 DIAGNOSIS — Z7901 Long term (current) use of anticoagulants: Secondary | ICD-10-CM | POA: Diagnosis not present

## 2020-08-31 DIAGNOSIS — R5381 Other malaise: Secondary | ICD-10-CM | POA: Diagnosis not present

## 2020-08-31 DIAGNOSIS — Z79899 Other long term (current) drug therapy: Secondary | ICD-10-CM | POA: Diagnosis not present

## 2020-08-31 DIAGNOSIS — I119 Hypertensive heart disease without heart failure: Secondary | ICD-10-CM | POA: Diagnosis not present

## 2020-08-31 DIAGNOSIS — R55 Syncope and collapse: Secondary | ICD-10-CM | POA: Insufficient documentation

## 2020-08-31 DIAGNOSIS — Z87891 Personal history of nicotine dependence: Secondary | ICD-10-CM | POA: Diagnosis not present

## 2020-08-31 DIAGNOSIS — Z7982 Long term (current) use of aspirin: Secondary | ICD-10-CM | POA: Insufficient documentation

## 2020-08-31 DIAGNOSIS — R5383 Other fatigue: Secondary | ICD-10-CM | POA: Insufficient documentation

## 2020-08-31 DIAGNOSIS — Z85828 Personal history of other malignant neoplasm of skin: Secondary | ICD-10-CM | POA: Insufficient documentation

## 2020-08-31 DIAGNOSIS — Z20822 Contact with and (suspected) exposure to covid-19: Secondary | ICD-10-CM | POA: Diagnosis not present

## 2020-08-31 DIAGNOSIS — J45901 Unspecified asthma with (acute) exacerbation: Secondary | ICD-10-CM | POA: Diagnosis not present

## 2020-08-31 DIAGNOSIS — R079 Chest pain, unspecified: Secondary | ICD-10-CM

## 2020-08-31 LAB — HEPATIC FUNCTION PANEL
ALT: 14 U/L (ref 0–44)
AST: 22 U/L (ref 15–41)
Albumin: 3.7 g/dL (ref 3.5–5.0)
Alkaline Phosphatase: 54 U/L (ref 38–126)
Bilirubin, Direct: 0.1 mg/dL (ref 0.0–0.2)
Indirect Bilirubin: 0.9 mg/dL (ref 0.3–0.9)
Total Bilirubin: 1 mg/dL (ref 0.3–1.2)
Total Protein: 6.8 g/dL (ref 6.5–8.1)

## 2020-08-31 LAB — CBC
HCT: 43.4 % (ref 39.0–52.0)
Hemoglobin: 14.1 g/dL (ref 13.0–17.0)
MCH: 32 pg (ref 26.0–34.0)
MCHC: 32.5 g/dL (ref 30.0–36.0)
MCV: 98.4 fL (ref 80.0–100.0)
Platelets: 302 10*3/uL (ref 150–400)
RBC: 4.41 MIL/uL (ref 4.22–5.81)
RDW: 13.5 % (ref 11.5–15.5)
WBC: 7.6 10*3/uL (ref 4.0–10.5)
nRBC: 0 % (ref 0.0–0.2)

## 2020-08-31 LAB — URINALYSIS, ROUTINE W REFLEX MICROSCOPIC
Bilirubin Urine: NEGATIVE
Glucose, UA: NEGATIVE mg/dL
Hgb urine dipstick: NEGATIVE
Ketones, ur: NEGATIVE mg/dL
Leukocytes,Ua: NEGATIVE
Nitrite: NEGATIVE
Protein, ur: NEGATIVE mg/dL
Specific Gravity, Urine: 1.016 (ref 1.005–1.030)
pH: 5 (ref 5.0–8.0)

## 2020-08-31 LAB — BASIC METABOLIC PANEL
Anion gap: 9 (ref 5–15)
BUN: 10 mg/dL (ref 8–23)
CO2: 24 mmol/L (ref 22–32)
Calcium: 9.3 mg/dL (ref 8.9–10.3)
Chloride: 105 mmol/L (ref 98–111)
Creatinine, Ser: 0.92 mg/dL (ref 0.61–1.24)
GFR, Estimated: >60 mL/min (ref >60)
Glucose, Bld: 123 mg/dL — ABNORMAL HIGH (ref 70–99)
Potassium: 4 mmol/L (ref 3.5–5.1)
Sodium: 138 mmol/L (ref 135–145)

## 2020-08-31 LAB — LACTIC ACID, PLASMA: Lactic Acid, Venous: 1.6 mmol/L (ref 0.5–1.9)

## 2020-08-31 LAB — CBG MONITORING, ED: Glucose-Capillary: 95 mg/dL (ref 70–99)

## 2020-08-31 LAB — RESPIRATORY PANEL BY RT PCR (FLU A&B, COVID)
Influenza A by PCR: NEGATIVE
Influenza B by PCR: NEGATIVE
SARS Coronavirus 2 by RT PCR: NEGATIVE

## 2020-08-31 LAB — TROPONIN I (HIGH SENSITIVITY): Troponin I (High Sensitivity): 5 ng/L (ref <18)

## 2020-08-31 LAB — LIPASE, BLOOD: Lipase: 40 U/L (ref 11–51)

## 2020-08-31 MED ORDER — LACTATED RINGERS IV BOLUS
500.0000 mL | Freq: Once | INTRAVENOUS | Status: AC
  Administered 2020-08-31: 500 mL via INTRAVENOUS

## 2020-08-31 MED ORDER — SODIUM CHLORIDE 0.9% FLUSH
3.0000 mL | Freq: Once | INTRAVENOUS | Status: AC
  Administered 2020-08-31: 3 mL via INTRAVENOUS

## 2020-08-31 NOTE — Discharge Instructions (Addendum)
1.  Call your family doctor and your cardiologist for recheck this week. 2.  Return to the emergency department if you have any advancing or concerning symptoms.

## 2020-08-31 NOTE — ED Triage Notes (Addendum)
Per pt and spouse, pt "just kinda wilted to the floor", but denies any LOC or head injury - states he "went to my knees".  Has been having intermittent abd cramping and nausea. Pt alert, talkative, smiling.  Skin W/D/P.

## 2020-08-31 NOTE — ED Notes (Signed)
EDP at bedside  

## 2020-08-31 NOTE — ED Notes (Signed)
Patient is being discharged from the Urgent Care and sent to the Emergency Department via private vehicle with spouse . Per H. Wieters, PA, patient is in need of higher level of care due to near-syncope, abd pain. Patient is aware and verbalizes understanding of plan of care.  Vitals:   08/31/20 1217  BP: 139/83  Pulse: 66  Resp: 16  Temp: (!) 97.2 F (36.2 C)  SpO2: 97%

## 2020-08-31 NOTE — ED Triage Notes (Signed)
Wife reports pt had syncopal episode at home this morning.  Pt states he fell to his knees.  Reports intermittent abd pain but denies pain at present.  States he has nausea.  Denies dizziness.  No arm drift.

## 2020-08-31 NOTE — ED Provider Notes (Signed)
[facility] DEPARTMENT Provider Note   CSN: [id number] Arrival date & time: 08/31/20  1242     History Chief Complaint  Patient presents with  . syncopal episode    [name] is a 86 y.o. male.  HPI Patient reports he had been sitting at the kitchen table.  He reports he felt little nauseated so he got up to go to the kitchen sink.  He reports as he got to the sink he started to get very weak and lightheaded and slowly collapsed down to his knees and then laid down on the ground.  Patient does not think he had complete loss of consciousness.  He denies any injury during the episode.  Patient's wife came into the kitchen to find him lying on the floor.  She reports he looked very red in the face and was generally weak.  There was no focal weakness.  Symptoms resolved spontaneously.  They did call EMS for a syncopal episode.  Patient reports he has not felt well this week.  Typically he and his wife go out to dinners and are active.  This week he has not felt up to going out.  He has had some increased general fatigue and lack of energy.  No documented fever but sometimes he has noticed some chills and sweats particularly at night.  Patient reports he does have a somewhat "sensitive" stomach.  He reports he needs to be careful about what he eats and how much he drinks or he does get upset stomach.  However, this week has been a little atypical and that he has had more lower abdominal discomfort.  Been intermittently crampy and uncomfortable.  He has not had any diarrhea.  He has had bouts of nausea.  No specific pain burning urgency with urination.  No history of frequent UTI.  Patient reports he did get his flu shot a week ago.  He did not really attribute the symptoms to the flu shot.  He had his third Covid booster about 3 weeks ago.  Patient's wife is well.  No symptoms of general illness.    Past Medical History:  Diagnosis Date  . CAD (coronary artery  disease)    a. s/p inferior STEMI 01/08/2014; LHC 01/08/14: total RCA occlusion s/p 3.5x75mm Xience DES distal RCA and 3.5x28 mm DES mid RCA, 60-70% mid LAD stenosis, EF 55%.  . Complication of anesthesia    'long to wake up after back surgery " 09/2003  . Diverticulosis of colon   . GERD (gastroesophageal reflux disease)   . History of cellulitis    05-26-2015  LLE  . History of colon polyps    1998- benign/  2008 adenomatous   . History of kidney stones    2013  . History of squamous cell carcinoma excision    2013;  2015;  06-12-2015 right leg/  02/ and 05/ 2017  left ear and left leg  . Hydronephrosis, left   . Hyperlipidemia   . Hypertension   . Migraine with aura   . OSA on CPAP 06/19/2015   Moderate OSA with AHI 18/hr  per study 05-20-2015  . Osteoarthritis   . Paroxysmal atrial fibrillation ([place]) 4/16   chads2vasc score is at least 4  . Premature atrial contractions   . RA (rheumatoid arthritis) [facility])    rheumatologist-  dr Leigh Aurora  . Sinusitis, chronic 10/17/2015  . Wears glasses   . Wears hearing aid    bilateral  Patient Active Problem List   Diagnosis Date Noted  . Chest pain   . Dyspepsia   . Encounter for medication management 11/14/2019  . Angular stomatitis 07/09/2019  . Cervical radiculopathy 06/05/2019  . Pain in joint of left shoulder 05/31/2019  . Concussion 04/17/2019  . Hemoptysis 04/05/2019  . Confusion 04/05/2019  . Memory loss 12/21/2018  . Nausea & vomiting 02/07/2018  . Chills 02/07/2018  . Rash 02/07/2018  . Insomnia 09/22/2017  . Cervical spondylolysis 06/28/2017  . Hyperglycemia 06/22/2017  . Well adult exam 11/18/2016  . Hydronephrosis 11/18/2016  . UPJ obstruction, 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  . Allergic rhinitis 06/02/2016  . Asthma with exacerbation 06/02/2016  . Sinusitis, chronic 10/17/2015  . OSA (obstructive sleep apnea) 06/19/2015  . Cellulitis and abscess of leg, except foot 05/26/2015  . Paroxysmal atrial fibrillation  ([place]) 02/21/2015  . CAP (community acquired pneumonia) 01/02/2015  . Upper respiratory infection 12/02/2014  . Vivid dream 09/25/2014  . Actinic keratoses 04/26/2014  . CAD (coronary artery disease) 02/25/2014  . STEMI (ST elevation myocardial infarction) ([place]) 01/08/2014  . Rheumatoid bursitis of left elbow ([place]) 08/08/2013  . Situational depression 06/18/2013  . Neck pain 04/23/2012  . Acute abdominal pain in left flank 02/14/2012  . Hypertension 09/23/2011  . Chest pain, midsternal 09/13/2011  . Mixed hyperlipidemia 07/23/2010  . Palpitations 07/07/2010  . Rheumatoid arthritis ([place]) 06/04/2010  . PARESTHESIA 06/04/2010  . ARTHRALGIA 11/27/2009  . MYALGIA 08/21/2009  . ALLERGIC RHINITIS 06/11/2008  . OSTEOARTHRITIS 06/11/2008  . FATIGUE 06/11/2008  . LOW BACK PAIN 07/27/2007  . COLONIC POLYPS, HX OF 07/27/2007    Past Surgical History:  Procedure Laterality Date  . BACK SURGERY    . CARDIOVASCULAR STRESS TEST  11/21/2015   Low risk nuclear study w/ a small diaphragmatic attenuation artifact, no ischemia/  normal LV function and wall motion , ef 63%  . CATARACT EXTRACTION W/ INTRAOCULAR LENS  IMPLANT, BILATERAL  2015  . COLONOSCOPY  last one 06-09-2011  . CORONARY ANGIOPLASTY    . CYSTOSCOPY W/ RETROGRADES Left 07/12/2016   Procedure: CYSTOSCOPY WITH RETROGRADE PYELOGRAM LEFT URETERAL STENT;  Surgeon: Irine Seal, MD;  Location: [facility];  Service: Urology;  Laterality: Left;  . CYSTOSCOPY WITH RETROGRADE PYELOGRAM, URETEROSCOPY AND STENT PLACEMENT Left 08/11/2016   Procedure: CYSTOSCOPY WITH RETROGRADE PYELOGRAM,  DIAGNOSTIC URETEROSCOPY , STENT EXCHANGE;  Surgeon: Alexis Frock, MD;  Location: [facility];  Service: Urology;  Laterality: Left;  . 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   severe fibromatosis palm and fingers  . LEFT HEART CATHETERIZATION WITH CORONARY ANGIOGRAM N/A 01/08/2014   Procedure: LEFT HEART CATHETERIZATION WITH CORONARY ANGIOGRAM;   Surgeon: Troy Sine, MD;  Location: [facility] CATH LAB;  Service: Cardiovascular;  Laterality:N/A;  total/ subtotal RCA/  mLAD 52-77% w/ mid systolic bridging/  preserved global LVF, ef 55%  . MOHS SURGERY  x2  feb and may 2017   left ear /  left leg  ([facility])  . PERCUTANEOUS CORONARY STENT INTERVENTION (PCI-S)  01/08/2014   Procedure: PERCUTANEOUS CORONARY STENT INTERVENTION (PCI-S);  Surgeon: Troy Sine, MD;  Location: [facility] CATH LAB;  Service: Cardiovascular;;  DES to mid and distal RCA  . POSTERIOR LUMBAR FUSION  10/01/2003   and Laminectomy/ diskectomy  L4 -- S1  . ROBOT ASSISTED PYELOPLASTY Left 09/15/2016   Procedure: XI ROBOTIC ASSISTED PYELOPLASTY;  Surgeon: Alexis Frock, MD;  Location: [facility];  Service: Urology;  Laterality: Left;  . TONSILLECTOMY    . TRANSTHORACIC  ECHOCARDIOGRAM  02/23/2016   mild LVH,  ef 50-55%,  grade 1 diastolic dysfunction/  mild to moderate AV calcification w/ no stenosis or regurg./  trivial MR and TR/  mild PR       Family History  Problem Relation Age of Onset  . Hypertension Mother   . Heart disease Father   . Colon cancer Neg Hx     Social History   Tobacco Use  . Smoking status: Former Smoker    Years: 10.00    Types: Cigarettes    Quit date: 08/09/1968    Years since quitting: 52.0  . Smokeless tobacco: Never Used  Vaping Use  . Vaping Use: Never used  Substance Use Topics  . Alcohol use: Yes    Alcohol/week: 2.0 standard drinks    Types: 2 Shots of liquor per week    Comment: daily  . Drug use: No    Home Medications Prior to Admission medications   Medication Sig Start Date End Date Taking? Authorizing Provider  acetaminophen (TYLENOL) 500 MG tablet Take 1,000 mg by mouth every 6 (six) hours as needed.    [provider]  acidophilus (RISAQUAD) CAPS capsule Take 1 capsule by mouth daily.    [provider]  aspirin EC 81 MG tablet Take 81 mg by mouth daily.    [provider]  donepezil (ARICEPT) 10 MG  tablet Take 1/2 tablet daily 08/18/20   Cameron Sprang, MD  ELIQUIS 5 MG TABS tablet TAKE ONE TABLET TWICE DAILY 08/18/20   Sueanne Margarita, MD  escitalopram (LEXAPRO) 20 MG tablet Take 1 and 1/2 tablets daily 08/18/20   Cameron Sprang, MD  ezetimibe (ZETIA) 10 MG tablet TAKE ONE-HALF TABLET DAILY 04/03/19   Plotnikov, Evie Lacks, MD  lisinopril (ZESTRIL) 10 MG tablet Take 1 tablet (10 mg total) by mouth daily. 10/30/19   Sueanne Margarita, MD  memantine (NAMENDA) 10 MG tablet Take 1 tablet twice a day 08/18/20   Cameron Sprang, MD  metoprolol tartrate (LOPRESSOR) 25 MG tablet TAKE ONE TABLET TWICE DAILY 02/21/20   Sueanne Margarita, MD  nitroGLYCERIN (NITROSTAT) 0.4 MG SL tablet ONE TABLET UNDER TONGUE AS NEEDED FOR CHEST PAIN. MAY REPEAT IN 5 MINUTES AND AGAIN IN 5 MINUTES (TOTAL OF 3 TABLETS) 03/10/18   Allred, Jeneen Rinks, MD  omeprazole (PRILOSEC) 40 MG capsule TAKE ONE CAPSULE EACH DAY 12/03/19   Plotnikov, Evie Lacks, MD  oxyCODONE-acetaminophen (PERCOCET/ROXICET) 5-325 MG tablet TAKE ONE TABLET EVERY EIGHT HOURS AS NEEDED FOR SEVERE PAIN 07/13/20   Plotnikov, Evie Lacks, MD  Propylene Glycol (SYSTANE BALANCE OP) Place 2 drops into both eyes daily as needed (dry eyes).     [provider]  REPATHA SURECLICK 427 MG/ML SOAJ INJECT 1 PEN INTO SKIN EVERY 14 DAYS 12/31/19   Sueanne Margarita, MD  rosuvastatin (CRESTOR) 5 MG tablet Take 1 tablet (5 mg total) by mouth daily. 02/12/20   Allred, Jeneen Rinks, MD  zolpidem (AMBIEN) 10 MG tablet TAKE 1/2 TO 1 TABLET AT BEDTIME AS NEEDED FOR SLEEP 08/14/20   Plotnikov, Evie Lacks, MD    Allergies    Methotrexate, Aricept Reather Littler hcl], Crestor [rosuvastatin calcium], Lisinopril, Atorvastatin, and Pravastatin sodium  Review of Systems   Review of Systems 10 systems reviewed and negative except as per HPI Physical Exam Updated Vital Signs BP (!) 158/73 (BP Location: Right Arm)   Pulse 73   Temp 97.9 F (36.6 C) (Oral)   Resp 20  SpO2 98%   Physical  Exam Constitutional:      Appearance: Normal appearance. He is well-developed.  HENT:     Head: Normocephalic and atraumatic.     Mouth/Throat:     Pharynx: Oropharynx is clear.  Eyes:     Extraocular Movements: Extraocular movements intact.     Pupils: Pupils are equal, round, and reactive to light.  Cardiovascular:     Rate and Rhythm: Normal rate and regular rhythm.     Heart sounds: Normal heart sounds.  Pulmonary:     Effort: Pulmonary effort is normal.     Breath sounds: Normal breath sounds.  Abdominal:     General: Bowel sounds are normal. There is no distension.     Palpations: Abdomen is soft.     Tenderness: There is no abdominal tenderness. There is no guarding.  Musculoskeletal:        General: No swelling or tenderness. Normal range of motion.     Cervical back: Neck supple.     Right lower leg: No edema.     Left lower leg: No edema.  Skin:    General: Skin is warm and dry.     Findings: No rash.  Neurological:     General: No focal deficit present.     Mental Status: He is alert and oriented to person, place, and time.     GCS: GCS eye subscore is 4. GCS verbal subscore is 5. GCS motor subscore is 6.     Motor: No weakness.     Coordination: Coordination normal.  Psychiatric:        Mood and Affect: Mood normal.     ED Results / Procedures / Treatments   Labs (all labs ordered are listed, but only abnormal results are displayed) Labs Reviewed  BASIC METABOLIC PANEL - Abnormal; Notable for the following components:      Result Value   Glucose, Bld 123 (*)    All other components within normal limits  RESPIRATORY PANEL BY RT PCR (FLU A&B, COVID)  CBC  URINALYSIS, ROUTINE W REFLEX MICROSCOPIC  HEPATIC FUNCTION PANEL  LIPASE, BLOOD  LACTIC ACID, PLASMA  CBG MONITORING, ED  TROPONIN I (HIGH SENSITIVITY)  TROPONIN I (HIGH SENSITIVITY)    EKG EKG Interpretation  Date/Time:  Sunday August 31 2020 12:50:24 EDT Ventricular Rate:  71 PR  Interval:  170 QRS Duration: 86 QT Interval:  416 QTC Calculation: 452 R Axis:   51 Text Interpretation: Normal sinus rhythm Normal ECG agree, no change from previous Confirmed by Charlesetta Shanks [phone]) on 08/31/2020 2:43:22 PM   Radiology No results found.  Procedures Procedures (including critical care time)  Medications Ordered in ED Medications  sodium chloride flush (NS) 0.9 % injection 3 mL (3 mLs Intravenous Given 08/31/20 1313)  lactated ringers bolus 500 mL (0 mLs Intravenous Stopped 08/31/20 1721)    ED Course  I have reviewed the triage vital signs and the nursing notes.  Pertinent labs & imaging results that were available during my care of the patient were reviewed by me and considered in my medical decision making (see chart for details).    MDM Rules/Calculators/A&P                         Diagnostic work-up unremarkable.  Patient had a near syncopal episode preceded by nausea and lightheadedness.  No associated chest pain, headache, palpitation.  Patient did not become significantly pale or diaphoretic.  For  a week he has had some generalized symptoms of malaise, decreased energy mild abdominal discomfort.  Patient did get his flu immunization approximately 1 week ago.  He does report decreased oral intake this week.  Review of systems and diagnostic work-up do not reveal other etiology for likely infectious source.  Diagnostic studies are normal.  Patient is clinically well in appearance.  Vital signs are stable.  At this time, stable for discharge with close follow-up.  Recommendation to contact PCP and cardiology for follow-up visits.  Return precautions reviewed.  Final Clinical Impression(s) / ED Diagnoses Final diagnoses:  Near syncope  Malaise and fatigue    Rx / DC Orders ED Discharge Orders    None       Charlesetta Shanks, MD 09/01/20 1409

## 2020-09-01 ENCOUNTER — Telehealth: Payer: Self-pay | Admitting: Internal Medicine

## 2020-09-01 NOTE — Telephone Encounter (Signed)
    Spouse calling to report patient is nauseous and lethargic. Spouse states the patient just doesn't seem like himself   Call transferred to [facility]

## 2020-09-04 ENCOUNTER — Other Ambulatory Visit: Payer: Self-pay

## 2020-09-04 ENCOUNTER — Encounter: Payer: Self-pay | Admitting: Internal Medicine

## 2020-09-04 ENCOUNTER — Ambulatory Visit (INDEPENDENT_AMBULATORY_CARE_PROVIDER_SITE_OTHER): Payer: Medicare Other | Admitting: Internal Medicine

## 2020-09-04 DIAGNOSIS — R413 Other amnesia: Secondary | ICD-10-CM

## 2020-09-04 DIAGNOSIS — I2510 Atherosclerotic heart disease of native coronary artery without angina pectoris: Secondary | ICD-10-CM

## 2020-09-04 DIAGNOSIS — R112 Nausea with vomiting, unspecified: Secondary | ICD-10-CM | POA: Diagnosis not present

## 2020-09-04 DIAGNOSIS — M069 Rheumatoid arthritis, unspecified: Secondary | ICD-10-CM | POA: Diagnosis not present

## 2020-09-04 DIAGNOSIS — R55 Syncope and collapse: Secondary | ICD-10-CM | POA: Insufficient documentation

## 2020-09-04 MED ORDER — ESCITALOPRAM OXALATE 20 MG PO TABS
ORAL_TABLET | ORAL | 3 refills | Status: DC
Start: 2020-09-04 — End: 2021-03-30

## 2020-09-04 MED ORDER — LISINOPRIL 10 MG PO TABS: 5.0000 mg | ORAL_TABLET | Freq: Every day | ORAL | 3 refills | Status: DC

## 2020-09-04 NOTE — Assessment & Plan Note (Signed)
on Lexapro 30 mg from 20 mg/d changed recently -- go back to Lexapro 20 mg/d (-) ER eval

## 2020-09-04 NOTE — Progress Notes (Addendum)
Subjective:  Patient ID: [name], male    DOB: 1933-10-29  Age: 86 y.o. MRN: [id number]  CC: No chief complaint on file.   HPI [name] presents for a post-hospital visit for near-syncope.  Per ED d/c on 10.17: "Patient had a near syncopal episode preceded by nausea and lightheadedness.  No associated chest pain, headache, palpitation.  Patient did not become significantly pale or diaphoretic.  For a week he has had some generalized symptoms of malaise, decreased energy mild abdominal discomfort.  Patient did get his flu immunization approximately 1 week ago.  He does report decreased oral intake this week.  Review of systems and diagnostic work-up do not reveal other etiology for likely infectious source.  Diagnostic studies are normal.  Patient is clinically well in appearance.  Vital signs are stable."  Not feeling well for a few days, nauseated Memory issues f/u. C/o nausea - on Lexapro 30 mg from 20 mg/d changed recently C/o loose stools today x2 and off and on    Past Medical History:  Diagnosis Date  . CAD (coronary artery disease)    a. s/p inferior STEMI 01/08/2014; LHC 01/08/14: total RCA occlusion s/p 3.5x109mm Xience DES distal RCA and 3.5x28 mm DES mid RCA, 60-70% mid LAD stenosis, EF 55%.  . Complication of anesthesia    'long to wake up after back surgery " 09/2003  . Diverticulosis of colon   . GERD (gastroesophageal reflux disease)   . History of cellulitis    05-26-2015  LLE  . History of colon polyps    1998- benign/  2008 adenomatous   . History of kidney stones    2013  . History of squamous cell carcinoma excision    2013;  2015;  06-12-2015 right leg/  02/ and 05/ 2017  left ear and left leg  . Hydronephrosis, left   . Hyperlipidemia   . Hypertension   . Migraine with aura   . OSA on CPAP 06/19/2015   Moderate OSA with AHI 18/hr  per study 05-20-2015  . Osteoarthritis   . Paroxysmal atrial fibrillation ([place]) 4/16   chads2vasc score is at least 4    . Premature atrial contractions   . RA (rheumatoid arthritis) [facility])    rheumatologist-  dr Leigh Aurora  . Sinusitis, chronic 10/17/2015  . Wears glasses   . Wears hearing aid    bilateral   Past Surgical History:  Procedure Laterality Date  . BACK SURGERY    . CARDIOVASCULAR STRESS TEST  11/21/2015   Low risk nuclear study w/ a small diaphragmatic attenuation artifact, no ischemia/  normal LV function and wall motion , ef 63%  . CATARACT EXTRACTION W/ INTRAOCULAR LENS  IMPLANT, BILATERAL  2015  . COLONOSCOPY  last one 06-09-2011  . CORONARY ANGIOPLASTY    . CYSTOSCOPY W/ RETROGRADES Left 07/12/2016   Procedure: CYSTOSCOPY WITH RETROGRADE PYELOGRAM LEFT URETERAL STENT;  Surgeon: Irine Seal, MD;  Location: [facility];  Service: Urology;  Laterality: Left;  . CYSTOSCOPY WITH RETROGRADE PYELOGRAM, URETEROSCOPY AND STENT PLACEMENT Left 08/11/2016   Procedure: CYSTOSCOPY WITH RETROGRADE PYELOGRAM,  DIAGNOSTIC URETEROSCOPY , STENT EXCHANGE;  Surgeon: Alexis Frock, MD;  Location: [facility];  Service: Urology;  Laterality: Left;  . 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   severe fibromatosis palm and fingers  . LEFT HEART CATHETERIZATION WITH CORONARY ANGIOGRAM N/A 01/08/2014   Procedure: LEFT HEART CATHETERIZATION WITH CORONARY ANGIOGRAM;  Surgeon: Troy Sine, MD;  Location: [facility]  CATH LAB;  Service: Cardiovascular;  Laterality:N/A;  total/ subtotal RCA/  mLAD 32-35% w/ mid systolic bridging/  preserved global LVF, ef 55%  . MOHS SURGERY  x2  feb and may 2017   left ear /  left leg  ([facility])  . PERCUTANEOUS CORONARY STENT INTERVENTION (PCI-S)  01/08/2014   Procedure: PERCUTANEOUS CORONARY STENT INTERVENTION (PCI-S);  Surgeon: Troy Sine, MD;  Location: [facility] CATH LAB;  Service: Cardiovascular;;  DES to mid and distal RCA  . POSTERIOR LUMBAR FUSION  10/01/2003   and Laminectomy/ diskectomy  L4 -- S1  . ROBOT ASSISTED PYELOPLASTY Left 09/15/2016   Procedure: XI ROBOTIC  ASSISTED PYELOPLASTY;  Surgeon: Alexis Frock, MD;  Location: [facility];  Service: Urology;  Laterality: Left;  . TONSILLECTOMY    . TRANSTHORACIC ECHOCARDIOGRAM  02/23/2016   mild LVH,  ef 50-55%,  grade 1 diastolic dysfunction/  mild to moderate AV calcification w/ no stenosis or regurg./  trivial MR and TR/  mild PR    reports that he quit smoking about 52 years ago. His smoking use included cigarettes. He quit after 10.00 years of use. He has never used smokeless tobacco. He reports current alcohol use of about 2.0 standard drinks of alcohol per week. He reports that he does not use drugs. family history includes Heart disease in his father; Hypertension in his mother. Allergies  Allergen Reactions  . Methotrexate Other (See Comments)    REACTION: tachycardia  . Aricept [Donepezil Hcl]     nightmares   . Crestor [Rosuvastatin Calcium]     Myalgias with 20mg  dose  . Lisinopril Other (See Comments)    cough  . Atorvastatin Other (See Comments)    Muscle pain, leg cramps  . Pravastatin Sodium Other (See Comments)    REACTION: cramps, fatigue     Outpatient Medications Prior to Visit  Medication Sig Dispense Refill  . acetaminophen (TYLENOL) 500 MG tablet Take 1,000 mg by mouth every 6 (six) hours as needed.    [name] acidophilus (RISAQUAD) CAPS capsule Take 1 capsule by mouth daily.    [name] aspirin EC 81 MG tablet Take 81 mg by mouth daily.    [name] donepezil (ARICEPT) 10 MG tablet Take 1/2 tablet daily 45 tablet 3  . ELIQUIS 5 MG TABS tablet TAKE ONE TABLET TWICE DAILY 180 tablet 1  . escitalopram (LEXAPRO) 20 MG tablet Take 1 and 1/2 tablets daily 135 tablet 3  . ezetimibe (ZETIA) 10 MG tablet TAKE ONE-HALF TABLET DAILY 45 tablet 3  . lisinopril (ZESTRIL) 10 MG tablet Take 1 tablet (10 mg total) by mouth daily. 90 tablet 3  . memantine (NAMENDA) 10 MG tablet Take 1 tablet twice a day 180 tablet 3  . metoprolol tartrate (LOPRESSOR) 25 MG tablet TAKE ONE TABLET TWICE DAILY 180 tablet 2  .  nitroGLYCERIN (NITROSTAT) 0.4 MG SL tablet ONE TABLET UNDER TONGUE AS NEEDED FOR CHEST PAIN. MAY REPEAT IN 5 MINUTES AND AGAIN IN 5 MINUTES (TOTAL OF 3 TABLETS) 25 tablet 6  . omeprazole (PRILOSEC) 40 MG capsule TAKE ONE CAPSULE EACH DAY 90 capsule 3  . oxyCODONE-acetaminophen (PERCOCET/ROXICET) 5-325 MG tablet TAKE ONE TABLET EVERY EIGHT HOURS AS NEEDED FOR SEVERE PAIN 90 tablet 0  . Propylene Glycol (SYSTANE BALANCE OP) Place 2 drops into both eyes daily as needed (dry eyes).     [name] REPATHA SURECLICK 573 MG/ML SOAJ INJECT 1 PEN INTO SKIN EVERY 14 DAYS 2 mL 11  . rosuvastatin (CRESTOR) 5 MG tablet  Take 1 tablet (5 mg total) by mouth daily. 90 tablet 3  . zolpidem (AMBIEN) 10 MG tablet TAKE 1/2 TO 1 TABLET AT BEDTIME AS NEEDED FOR SLEEP 30 tablet 5   No facility-administered medications prior to visit.    ROS: Review of Systems  Constitutional: Positive for fatigue. Negative for appetite change and unexpected weight change.  HENT: Negative for congestion, nosebleeds, sneezing, sore throat and trouble swallowing.   Eyes: Negative for itching and visual disturbance.  Respiratory: Negative for cough.   Cardiovascular: Negative for chest pain, palpitations and leg swelling.  Gastrointestinal: Negative for abdominal distention, blood in stool, diarrhea and nausea.  Genitourinary: Negative for frequency and hematuria.  Musculoskeletal: Negative for back pain, gait problem, joint swelling and neck pain.  Skin: Negative for rash.  Neurological: Positive for weakness. Negative for dizziness, tremors and speech difficulty.  Hematological: Does not bruise/bleed easily.  Psychiatric/Behavioral: Negative for agitation, dysphoric mood and sleep disturbance. The patient is not nervous/anxious.     Objective:  BP 120/88 (BP Location: Left Arm, Patient Position: Sitting, Cuff Size: Large)   Pulse (!) 57   Temp (!) 92.4 F (33.6 C) (Oral)   Ht 6' (1.829 m)   Wt 186 lb 12.8 oz (84.7 kg)   SpO2 98%    BMI 25.33 kg/m   BP Readings from Last 3 Encounters:  09/04/20 120/88  08/31/20 (!) 158/73  08/31/20 139/83    Wt Readings from Last 3 Encounters:  09/04/20 186 lb 12.8 oz (84.7 kg)  08/18/20 188 lb (85.3 kg)  06/16/20 190 lb (86.2 kg)    Physical Exam Constitutional:      General: He is not in acute distress.    Appearance: He is well-developed.     Comments: NAD  Eyes:     Conjunctiva/sclera: Conjunctivae normal.     Pupils: Pupils are equal, round, and reactive to light.  Neck:     Thyroid: No thyromegaly.     Vascular: No JVD.  Cardiovascular:     Rate and Rhythm: Normal rate and regular rhythm.     Heart sounds: Normal heart sounds. No murmur heard.  No friction rub. No gallop.   Pulmonary:     Effort: Pulmonary effort is normal. No respiratory distress.     Breath sounds: Normal breath sounds. No wheezing or rales.  Chest:     Chest wall: No tenderness.  Abdominal:     General: Bowel sounds are normal. There is no distension.     Palpations: Abdomen is soft. There is no mass.     Tenderness: There is no abdominal tenderness. There is no guarding or rebound.  Musculoskeletal:        General: No tenderness. Normal range of motion.     Cervical back: Normal range of motion.  Lymphadenopathy:     Cervical: No cervical adenopathy.  Skin:    General: Skin is warm and dry.     Findings: No rash.  Neurological:     Mental Status: He is alert and oriented to person, place, and time.     Cranial Nerves: No cranial nerve deficit.     Motor: No abnormal muscle tone.     Coordination: Coordination normal.     Gait: Gait normal.     Deep Tendon Reflexes: Reflexes are normal and symmetric.  Psychiatric:        Behavior: Behavior normal.        Thought Content: Thought content normal.  Judgment: Judgment normal.   alert, cooperative  I spent >45 minutes with the patient and more than 50% of time was spent in counseling and coordination of care, discussed no  driving; [place] form was filled out.  Lab Results  Component Value Date   WBC 7.6 08/31/2020   HGB 14.1 08/31/2020   HCT 43.4 08/31/2020   PLT 302 08/31/2020   GLUCOSE 123 (H) 08/31/2020   CHOL 197 08/04/2020   TRIG 254 (H) 08/04/2020   HDL 47 08/04/2020   LDLDIRECT 168.9 05/24/2013   LDLCALC 106 (H) 08/04/2020   ALT 14 08/31/2020   AST 22 08/31/2020   NA 138 08/31/2020   K 4.0 08/31/2020   CL 105 08/31/2020   CREATININE 0.92 08/31/2020   BUN 10 08/31/2020   CO2 24 08/31/2020   TSH 1.18 12/13/2017   PSA 2.46 02/21/2017   INR 0.92 09/13/2016   HGBA1C 6.4 08/30/2017    No results found.  Assessment & Plan:    [name], MD

## 2020-09-04 NOTE — Patient Instructions (Addendum)
  Try Lion's Mane Mushroom extract or capsules for memory  Reduce Lexapro to 20 mg/day and Lisinopril to 5 mg a day  Drink more water  Do not drive

## 2020-09-04 NOTE — Assessment & Plan Note (Signed)
Not feeling well for a few days, nauseated Memory issues f/u. C/o nausea - on Lexapro 30 mg from 20 mg/d changed recently -- go back to Lexapro 20 mg/d

## 2020-09-04 NOTE — Assessment & Plan Note (Addendum)
Cont meds Try Lion's Mane Mushroom extract or capsules for memory  Do not drive

## 2020-09-05 NOTE — Assessment & Plan Note (Signed)
Zetia, ASA, Toprol, Crestor, Repatha

## 2020-09-05 NOTE — Assessment & Plan Note (Signed)
Percocet prn  Potential benefits of a long term opioids use as well as potential risks (i.e. addiction risk, apnea etc) and complications (i.e. Somnolence, constipation and others) were explained to the patient and were aknowledged.  

## 2020-09-05 NOTE — Addendum Note (Signed)
Addended by: Cassandria Anger on: 09/05/2020 06:36 AM   Modules accepted: Level of Service

## 2020-09-18 DIAGNOSIS — M0609 Rheumatoid arthritis without rheumatoid factor, multiple sites: Secondary | ICD-10-CM | POA: Diagnosis not present

## 2020-09-19 ENCOUNTER — Other Ambulatory Visit: Payer: Self-pay

## 2020-09-19 ENCOUNTER — Ambulatory Visit (INDEPENDENT_AMBULATORY_CARE_PROVIDER_SITE_OTHER): Payer: Medicare Other | Admitting: Cardiology

## 2020-09-19 ENCOUNTER — Encounter: Payer: Self-pay | Admitting: Cardiology

## 2020-09-19 VITALS — BP 108/80 | HR 60 | Ht 72.0 in | Wt 185.3 lb

## 2020-09-19 DIAGNOSIS — E782 Mixed hyperlipidemia: Secondary | ICD-10-CM

## 2020-09-19 DIAGNOSIS — Z9989 Dependence on other enabling machines and devices: Secondary | ICD-10-CM | POA: Diagnosis not present

## 2020-09-19 DIAGNOSIS — G4733 Obstructive sleep apnea (adult) (pediatric): Secondary | ICD-10-CM | POA: Diagnosis not present

## 2020-09-19 DIAGNOSIS — I480 Paroxysmal atrial fibrillation: Secondary | ICD-10-CM | POA: Diagnosis not present

## 2020-09-19 DIAGNOSIS — I10 Essential (primary) hypertension: Secondary | ICD-10-CM | POA: Diagnosis not present

## 2020-09-19 DIAGNOSIS — I2510 Atherosclerotic heart disease of native coronary artery without angina pectoris: Secondary | ICD-10-CM

## 2020-09-19 NOTE — Addendum Note (Signed)
Addended by: Dollene Primrose on: 09/19/2020 02:25 PM   Modules accepted: Orders

## 2020-09-19 NOTE — Patient Instructions (Signed)
Medication Instructions:  Your physician recommends that you continue on your current medications as directed. Please refer to the Current Medication list given to you today.  Labwork: Your physician recommends that you return for lab work in: 6 weeks for a repeat ALT and FLP   Testing/Procedures: None ordered.  Follow-Up: Your physician recommends that you schedule a follow-up appointment in:   Gae Bon, Dr Theodosia Blender Medical Assistant, will call you to schedule 8 weeks after your adjustment.  Any Other Special Instructions Will Be Listed Below (If Applicable).     If you need a refill on your cardiac medications before your next appointment, please call your pharmacy.

## 2020-09-19 NOTE — Progress Notes (Signed)
Date:  09/19/2020   ID:  [name], DOB [birth year], MRN [id number]   PCP:  Cassandria Anger, MD  Cardiologist:  Fransico Him, MD  Electrophysiologist:  Thompson Grayer, MD   Chief Complaint:  OSA, HTN, CAD, HLD  History of Present Illness:    [name] is a 86 y.o. male with a hx of CAD (inferior STEMI 12/2013 with total RCA occlusion s/p DESx2, residual 60-70% mLAD), paroxysmal atrial fib (dx 02/2015), PACs, OSA on CPAP, GERD, HTN, HLD(prior intolerances to other statins).  He has hx of moderate OSA with an AHI of 18/hr. He stopped using his CPAP and we ordered another home sleep study to get back on PAP therapy which showed severe OSA with nocturnal hypoxemia.    He is here today for followup and is doing well.  He denies any chest pain or pressure, SOB, DOE, PND, orthopnea, LE edema, dizziness, palpitations or syncope.   He has been complaining of diarrhea over the past few weeks but no fever or abdominal pain.  Denies any vomiting but has been nauseated.  He was in the ER  Last month with syncope while sitting at the kitchen table and felt nauseated.  He got up to go to the kitchen sink and felt weak and slowly collapsed down to his knees and laid down.  He did not have LOC. He apparently had been having abdominal cramping that week.   He also complains of He is compliant with his meds and is tolerating meds with no SE.    Prior CV studies:   The following studies were reviewed today:  Outside labs from PCP, home sleep study  Past Medical History:  Diagnosis Date  . CAD (coronary artery disease)    a. s/p inferior STEMI 01/08/2014; LHC 01/08/14: total RCA occlusion s/p 3.5x58mm Xience DES distal RCA and 3.5x28 mm DES mid RCA, 60-70% mid LAD stenosis, EF 55%.  . Complication of anesthesia    'long to wake up after back surgery " 09/2003  . Diverticulosis of colon   . GERD (gastroesophageal reflux disease)   . History of cellulitis    05-26-2015  LLE  . History of colon  polyps    1998- benign/  2008 adenomatous   . History of kidney stones    2013  . History of squamous cell carcinoma excision    2013;  2015;  06-12-2015 right leg/  02/ and 05/ 2017  left ear and left leg  . Hydronephrosis, left   . Hyperlipidemia   . Hypertension   . Migraine with aura   . OSA on CPAP 06/19/2015   Moderate OSA with AHI 18/hr  per study 05-20-2015  . Osteoarthritis   . Paroxysmal atrial fibrillation ([place]) 4/16   chads2vasc score is at least 4  . Premature atrial contractions   . RA (rheumatoid arthritis) [facility])    rheumatologist-  dr Leigh Aurora  . Sinusitis, chronic 10/17/2015  . Wears glasses   . Wears hearing aid    bilateral   Past Surgical History:  Procedure Laterality Date  . BACK SURGERY    . CARDIOVASCULAR STRESS TEST  11/21/2015   Low risk nuclear study w/ a small diaphragmatic attenuation artifact, no ischemia/  normal LV function and wall motion , ef 63%  . CATARACT EXTRACTION W/ INTRAOCULAR LENS  IMPLANT, BILATERAL  2015  . COLONOSCOPY  last one 06-09-2011  . CORONARY ANGIOPLASTY    . CYSTOSCOPY W/ RETROGRADES Left 07/12/2016  Procedure: CYSTOSCOPY WITH RETROGRADE PYELOGRAM LEFT URETERAL STENT;  Surgeon: Irine Seal, MD;  Location: [facility];  Service: Urology;  Laterality: Left;  . CYSTOSCOPY WITH RETROGRADE PYELOGRAM, URETEROSCOPY AND STENT PLACEMENT Left 08/11/2016   Procedure: CYSTOSCOPY WITH RETROGRADE PYELOGRAM,  DIAGNOSTIC URETEROSCOPY , STENT EXCHANGE;  Surgeon: Alexis Frock, MD;  Location: [facility];  Service: Urology;  Laterality: Left;  . 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   severe fibromatosis palm and fingers  . LEFT HEART CATHETERIZATION WITH CORONARY ANGIOGRAM N/A 01/08/2014   Procedure: LEFT HEART CATHETERIZATION WITH CORONARY ANGIOGRAM;  Surgeon: Troy Sine, MD;  Location: [facility] CATH LAB;  Service: Cardiovascular;  Laterality:N/A;  total/ subtotal RCA/  mLAD 35-32% w/ mid systolic bridging/  preserved global  LVF, ef 55%  . MOHS SURGERY  x2  feb and may 2017   left ear /  left leg  ([facility])  . PERCUTANEOUS CORONARY STENT INTERVENTION (PCI-S)  01/08/2014   Procedure: PERCUTANEOUS CORONARY STENT INTERVENTION (PCI-S);  Surgeon: Troy Sine, MD;  Location[facility] CATH LAB;  Service: Cardiovascular;;  DES to mid and distal RCA  . POSTERIOR LUMBAR FUSION  10/01/2003   and Laminectomy/ diskectomy  L4 -- S1  . ROBOT ASSISTED PYELOPLASTY Left 09/15/2016   Procedure: XI ROBOTIC ASSISTED PYELOPLASTY;  Surgeon: Alexis Frock, MD;  Location: [facility];  Service: Urology;  Laterality: Left;  . TONSILLECTOMY    . TRANSTHORACIC ECHOCARDIOGRAM  02/23/2016   mild LVH,  ef 50-55%,  grade 1 diastolic dysfunction/  mild to moderate AV calcification w/ no stenosis or regurg./  trivial MR and TR/  mild PR     Current Meds  Medication Sig  . acetaminophen (TYLENOL) 500 MG tablet Take 1,000 mg by mouth every 6 (six) hours as needed.  [name] acidophilus (RISAQUAD) CAPS capsule Take 1 capsule by mouth daily.  [name] aspirin EC 81 MG tablet Take 81 mg by mouth daily.  [name] donepezil (ARICEPT) 10 MG tablet Take 1/2 tablet daily  . ELIQUIS 5 MG TABS tablet TAKE ONE TABLET TWICE DAILY  . escitalopram (LEXAPRO) 20 MG tablet Take 1 tablet daily  . ezetimibe (ZETIA) 10 MG tablet TAKE ONE-HALF TABLET DAILY  . lisinopril (ZESTRIL) 10 MG tablet Take 0.5 tablets (5 mg total) by mouth daily.  . memantine (NAMENDA) 10 MG tablet Take 1 tablet twice a day  . metoprolol tartrate (LOPRESSOR) 25 MG tablet TAKE ONE TABLET TWICE DAILY  . nitroGLYCERIN (NITROSTAT) 0.4 MG SL tablet ONE TABLET UNDER TONGUE AS NEEDED FOR CHEST PAIN. MAY REPEAT IN 5 MINUTES AND AGAIN IN 5 MINUTES (TOTAL OF 3 TABLETS)  . omeprazole (PRILOSEC) 40 MG capsule TAKE ONE CAPSULE EACH DAY  . oxyCODONE-acetaminophen (PERCOCET/ROXICET) 5-325 MG tablet TAKE ONE TABLET EVERY EIGHT HOURS AS NEEDED FOR SEVERE PAIN  . Propylene Glycol (SYSTANE BALANCE OP) Place 2 drops into both eyes daily as  needed (dry eyes).   [name] REPATHA SURECLICK 992 MG/ML SOAJ INJECT 1 PEN INTO SKIN EVERY 14 DAYS  . rosuvastatin (CRESTOR) 5 MG tablet Take 1 tablet (5 mg total) by mouth daily.  [name] zolpidem (AMBIEN) 10 MG tablet TAKE 1/2 TO 1 TABLET AT BEDTIME AS NEEDED FOR SLEEP     Allergies:   Methotrexate, Aricept [donepezil hcl], Crestor [rosuvastatin calcium], Lisinopril, Atorvastatin, and Pravastatin sodium   Social History   Tobacco Use  . Smoking status: Former Smoker    Years: 10.00    Types: Cigarettes    Quit date: 08/09/1968    Years  since quitting: 52.1  . Smokeless tobacco: Never Used  Vaping Use  . Vaping Use: Never used  Substance Use Topics  . Alcohol use: Yes    Alcohol/week: 2.0 standard drinks    Types: 2 Shots of liquor per week    Comment: daily  . Drug use: No     Family Hx: The patient's family history includes Heart disease in his father; Hypertension in his mother. There is no history of Colon cancer.  ROS:   Please see the history of present illness.     All other systems reviewed and are negative.   Labs/Other Tests and Data Reviewed:    Recent Labs: 08/31/2020: ALT 14; BUN 10; Creatinine, Ser 0.92; Hemoglobin 14.1; Platelets 302; Potassium 4.0; Sodium 138   Recent Lipid Panel Lab Results  Component Value Date/Time   CHOL 197 08/04/2020 08:22 AM   TRIG 254 (H) 08/04/2020 08:22 AM   HDL 47 08/04/2020 08:22 AM   CHOLHDL 4.2 08/04/2020 08:22 AM   CHOLHDL 3 02/21/2017 01:49 PM   LDLCALC 106 (H) 08/04/2020 08:22 AM   LDLDIRECT 168.9 05/24/2013 07:32 AM    Wt Readings from Last 3 Encounters:  09/19/20 185 lb 5.4 oz (84.1 kg)  09/04/20 186 lb 12.8 oz (84.7 kg)  08/18/20 188 lb (85.3 kg)     Objective:    Vital Signs:  BP 108/80   Pulse 60   Ht 6' (1.829 m)   Wt 185 lb 5.4 oz (84.1 kg)   SpO2 93%   BMI 25.14 kg/m    GEN: Well nourished, well developed in no acute distress HEENT: Normal NECK: No JVD; No carotid bruits LYMPHATICS: No  lymphadenopathy CARDIAC:RRR, no murmurs, rubs, gallops RESPIRATORY:  Clear to auscultation without rales, wheezing or rhonchi  ABDOMEN: Soft, non-tender, non-distended MUSCULOSKELETAL:  No edema; No deformity  SKIN: Warm and dry NEUROLOGIC:  Alert and oriented x 3 PSYCHIATRIC:  Normal affect    ASSESSMENT & PLAN:    1.  OSA  -   He stopped using his CPAP as he was not sleeping well with it and did not think he still had OSA -We discussed the outcomes of patients with untreated OSA including CHF, progression of CAD, HTN, increased episodes of Afib -we got a home sleep study which showed severe OSA with an AHI of 47.6/hr and nocturnal hypoxemia with O2 sats as low 78%.   -he was set up for CPAP titration but cancelled the appt -I will start him on auto CPAP from 4 -20cm H2O and get a download in 4 weeks and an overnight pulse ox on CPAP  2.  HTN -BP controlled -continue Lopressor 25mg  BID and lisinopril 10mg  daily  3.  ASCAD -s/p inferior STEMI 12/2013 with total RCA occlusion s/p DESx2, residual 60-70% mLAD.  -nuclear stress test 03/2020 showed no ischemia -he has not had any chest pain -continue ASA 81mg  daily, BB and statin  4.  PAF -he is maintaining NSR and has no palpitations -continue Lopressor 25mg  BID and Eliquis 5mg  BID(wt >80Kg and creatinine < 1.5) -Denies any bleeding with DOAC -SCr 0.92 and Hbg 14  In Oct 2021  5.  HLD -LDL goal < 70 -LDL 106 in Sept 2021 -continue Crestor 5mg  daily, Repatha and Zetia 10mg  daily -encouraged to work on diet and exercise -he has missed several doses of Repatha recently -repeat FLP and ALT in 6 weeks  Patient Risk:   After full review of this patient's clinical status, I feel  that they are at least moderate risk at this time.  Medication Adjustments/Labs and Tests Ordered: Current medicines are reviewed at length with the patient today.  Concerns regarding medicines are outlined above.  Tests Ordered: No orders of the defined  types were placed in this encounter.  Medication Changes: No orders of the defined types were placed in this encounter.   Disposition:  Follow up in 1 year(s)  Signed, Fransico Him, MD  09/19/2020 2:05 PM    [place]

## 2020-09-22 ENCOUNTER — Telehealth: Payer: Self-pay | Admitting: *Deleted

## 2020-09-22 NOTE — Telephone Encounter (Signed)
Scheduler left message voicemail; appointment needed Dr  [name]

## 2020-09-22 NOTE — Telephone Encounter (Signed)
-----   Message from Dollene Primrose, RN sent at 09/19/2020  2:32 PM EDT ----- Regarding: Pt needs appt with Dr Tollie Pizza,  I am working with Dr Fransico Him today. She saw Mr Alejandro today and is highly concerned with his ongoing nausea and diarrhea. His wife states he has had lightheadedness and almost passed out this week. She feels he needs to be seen by Dr. [name] asap.   I am copying her primary nurse for follow up.  Thanks so much!  Clois Dupes

## 2020-09-23 ENCOUNTER — Ambulatory Visit (INDEPENDENT_AMBULATORY_CARE_PROVIDER_SITE_OTHER): Payer: Medicare Other | Admitting: Internal Medicine

## 2020-09-23 ENCOUNTER — Other Ambulatory Visit: Payer: Self-pay

## 2020-09-23 VITALS — BP 134/82 | HR 60 | Temp 97.8°F | Ht 72.0 in | Wt 183.0 lb

## 2020-09-23 DIAGNOSIS — R1084 Generalized abdominal pain: Secondary | ICD-10-CM | POA: Diagnosis not present

## 2020-09-23 DIAGNOSIS — R112 Nausea with vomiting, unspecified: Secondary | ICD-10-CM

## 2020-09-23 DIAGNOSIS — R413 Other amnesia: Secondary | ICD-10-CM | POA: Diagnosis not present

## 2020-09-23 DIAGNOSIS — R634 Abnormal weight loss: Secondary | ICD-10-CM | POA: Diagnosis not present

## 2020-09-23 DIAGNOSIS — I2510 Atherosclerotic heart disease of native coronary artery without angina pectoris: Secondary | ICD-10-CM | POA: Diagnosis not present

## 2020-09-23 NOTE — Progress Notes (Signed)
Subjective:  Patient ID: [name], male    DOB: May 28, 1933  Age: 86 y.o. MRN: [id number]  CC: Fatigue and Diarrhea   HPI Ajdin Macke [facility] presents for lost his appetite x 1-2 months, wt loss, fatigue, sleepiness, diarrhea No syncope On Namenda since Oct 4,2021  Outpatient Medications Prior to Visit  Medication Sig Dispense Refill  . acetaminophen (TYLENOL) 500 MG tablet Take 1,000 mg by mouth every 6 (six) hours as needed.    [name] acidophilus (RISAQUAD) CAPS capsule Take 1 capsule by mouth daily.    [name] aspirin EC 81 MG tablet Take 81 mg by mouth daily.    [name] donepezil (ARICEPT) 10 MG tablet Take 1/2 tablet daily 45 tablet 3  . ELIQUIS 5 MG TABS tablet TAKE ONE TABLET TWICE DAILY 180 tablet 1  . escitalopram (LEXAPRO) 20 MG tablet Take 1 tablet daily 90 tablet 3  . ezetimibe (ZETIA) 10 MG tablet TAKE ONE-HALF TABLET DAILY 45 tablet 3  . lisinopril (ZESTRIL) 10 MG tablet Take 0.5 tablets (5 mg total) by mouth daily. 90 tablet 3  . memantine (NAMENDA) 10 MG tablet Take 1 tablet twice a day 180 tablet 3  . metoprolol tartrate (LOPRESSOR) 25 MG tablet TAKE ONE TABLET TWICE DAILY 180 tablet 2  . nitroGLYCERIN (NITROSTAT) 0.4 MG SL tablet ONE TABLET UNDER TONGUE AS NEEDED FOR CHEST PAIN. MAY REPEAT IN 5 MINUTES AND AGAIN IN 5 MINUTES (TOTAL OF 3 TABLETS) 25 tablet 6  . omeprazole (PRILOSEC) 40 MG capsule TAKE ONE CAPSULE EACH DAY 90 capsule 3  . oxyCODONE-acetaminophen (PERCOCET/ROXICET) 5-325 MG tablet TAKE ONE TABLET EVERY EIGHT HOURS AS NEEDED FOR SEVERE PAIN 90 tablet 0  . Propylene Glycol (SYSTANE BALANCE OP) Place 2 drops into both eyes daily as needed (dry eyes).     [name] REPATHA SURECLICK 315 MG/ML SOAJ INJECT 1 PEN INTO SKIN EVERY 14 DAYS 2 mL 11  . rosuvastatin (CRESTOR) 5 MG tablet Take 1 tablet (5 mg total) by mouth daily. 90 tablet 3  . zolpidem (AMBIEN) 10 MG tablet TAKE 1/2 TO 1 TABLET AT BEDTIME AS NEEDED FOR SLEEP 30 tablet 5   No facility-administered medications prior to visit.      ROS: Review of Systems  Constitutional: Positive for fatigue and unexpected weight change. Negative for appetite change.  HENT: Negative for congestion, nosebleeds, sneezing, sore throat and trouble swallowing.   Eyes: Negative for itching and visual disturbance.  Respiratory: Negative for cough.   Cardiovascular: Negative for chest pain, palpitations and leg swelling.  Gastrointestinal: Positive for abdominal pain, diarrhea and nausea. Negative for abdominal distention and blood in stool.  Genitourinary: Negative for frequency and hematuria.  Musculoskeletal: Positive for arthralgias, back pain and gait problem. Negative for joint swelling and neck pain.  Skin: Negative for rash.  Neurological: Negative for dizziness, tremors, speech difficulty and weakness.  Psychiatric/Behavioral: Positive for decreased concentration. Negative for agitation, dysphoric mood and sleep disturbance. The patient is nervous/anxious.     Objective:  BP 134/82   Pulse 60   Temp 97.8 F (36.6 C) (Oral)   Ht 6' (1.829 m)   Wt 183 lb (83 kg)   SpO2 99%   BMI 24.82 kg/m   BP Readings from Last 3 Encounters:  09/23/20 134/82  09/19/20 108/80  09/04/20 120/88    Wt Readings from Last 3 Encounters:  09/23/20 183 lb (83 kg)  09/19/20 185 lb 5.4 oz (84.1 kg)  09/04/20 186 lb 12.8 oz (84.7 kg)  Physical Exam Constitutional:      General: He is not in acute distress.    Appearance: Normal appearance. He is well-developed.     Comments: NAD  Eyes:     Conjunctiva/sclera: Conjunctivae normal.     Pupils: Pupils are equal, round, and reactive to light.  Neck:     Thyroid: No thyromegaly.     Vascular: No JVD.  Cardiovascular:     Rate and Rhythm: Normal rate and regular rhythm.     Heart sounds: Normal heart sounds. No murmur heard.  No friction rub. No gallop.   Pulmonary:     Effort: Pulmonary effort is normal. No respiratory distress.     Breath sounds: Normal breath sounds. No wheezing  or rales.  Chest:     Chest wall: No tenderness.  Abdominal:     General: Bowel sounds are normal. There is no distension.     Palpations: Abdomen is soft. There is no mass.     Tenderness: There is no abdominal tenderness. There is no guarding or rebound.  Musculoskeletal:        General: No tenderness. Normal range of motion.     Cervical back: Normal range of motion.  Lymphadenopathy:     Cervical: No cervical adenopathy.  Skin:    General: Skin is warm and dry.     Findings: No rash.  Neurological:     Mental Status: He is alert and oriented to person, place, and time.     Cranial Nerves: No cranial nerve deficit.     Motor: No abnormal muscle tone.     Coordination: Coordination normal.     Gait: Gait abnormal.     Deep Tendon Reflexes: Reflexes are normal and symmetric.     Lab Results  Component Value Date   WBC 7.6 08/31/2020   HGB 14.1 08/31/2020   HCT 43.4 08/31/2020   PLT 302 08/31/2020   GLUCOSE 123 (H) 08/31/2020   CHOL 197 08/04/2020   TRIG 254 (H) 08/04/2020   HDL 47 08/04/2020   LDLDIRECT 168.9 05/24/2013   LDLCALC 106 (H) 08/04/2020   ALT 14 08/31/2020   AST 22 08/31/2020   NA 138 08/31/2020   K 4.0 08/31/2020   CL 105 08/31/2020   CREATININE 0.92 08/31/2020   BUN 10 08/31/2020   CO2 24 08/31/2020   TSH 1.18 12/13/2017   PSA 2.46 02/21/2017   INR 0.92 09/13/2016   HGBA1C 6.4 08/30/2017    No results found.  Assessment & Plan:    [name], MD

## 2020-09-23 NOTE — Patient Instructions (Addendum)
Stop Memantine

## 2020-09-24 ENCOUNTER — Telehealth: Payer: Self-pay | Admitting: *Deleted

## 2020-09-24 DIAGNOSIS — G4733 Obstructive sleep apnea (adult) (pediatric): Secondary | ICD-10-CM

## 2020-09-24 NOTE — Telephone Encounter (Addendum)
he was set up for CPAP titration but cancelled the appt  I will start him on auto CPAP from 4 -20cm H2O and get a download in 4 weeks and an overnight pulse ox on CPAP  Order [facility] to [facility].

## 2020-09-24 NOTE — Telephone Encounter (Signed)
-----   Message from Dollene Primrose, RN sent at 09/19/2020  2:25 PM EDT ----- Regarding: Cpap changes Hello Gae Bon,  Dr Radford Pax would like you to change his settings to 4 to [facility]. He needs to be scheduled 8 weeks out after the change is made.  Thanks!  Lorren

## 2020-09-29 ENCOUNTER — Encounter: Payer: Self-pay | Admitting: Internal Medicine

## 2020-09-29 DIAGNOSIS — R634 Abnormal weight loss: Secondary | ICD-10-CM | POA: Insufficient documentation

## 2020-09-29 NOTE — Assessment & Plan Note (Signed)
D/c Namenda

## 2020-09-29 NOTE — Assessment & Plan Note (Signed)
Stable Do not drive!

## 2020-10-13 ENCOUNTER — Ambulatory Visit
Admission: RE | Admit: 2020-10-13 | Discharge: 2020-10-13 | Disposition: A | Discharge status: Home / Self Care | Payer: Medicare Other | Source: Ambulatory Visit | Attending: Internal Medicine | Admitting: Internal Medicine

## 2020-10-13 DIAGNOSIS — R110 Nausea: Secondary | ICD-10-CM | POA: Diagnosis not present

## 2020-10-13 DIAGNOSIS — R634 Abnormal weight loss: Secondary | ICD-10-CM | POA: Diagnosis not present

## 2020-10-13 DIAGNOSIS — R140 Abdominal distension (gaseous): Secondary | ICD-10-CM | POA: Diagnosis not present

## 2020-10-16 ENCOUNTER — Encounter: Payer: Self-pay | Admitting: Internal Medicine

## 2020-10-16 ENCOUNTER — Other Ambulatory Visit: Payer: Self-pay

## 2020-10-16 ENCOUNTER — Ambulatory Visit (INDEPENDENT_AMBULATORY_CARE_PROVIDER_SITE_OTHER): Payer: Medicare Other | Admitting: Internal Medicine

## 2020-10-16 DIAGNOSIS — F4321 Adjustment disorder with depressed mood: Secondary | ICD-10-CM | POA: Diagnosis not present

## 2020-10-16 DIAGNOSIS — E782 Mixed hyperlipidemia: Secondary | ICD-10-CM

## 2020-10-16 DIAGNOSIS — M5440 Lumbago with sciatica, unspecified side: Secondary | ICD-10-CM

## 2020-10-16 DIAGNOSIS — R413 Other amnesia: Secondary | ICD-10-CM | POA: Diagnosis not present

## 2020-10-16 DIAGNOSIS — I480 Paroxysmal atrial fibrillation: Secondary | ICD-10-CM | POA: Diagnosis not present

## 2020-10-16 DIAGNOSIS — I10 Essential (primary) hypertension: Secondary | ICD-10-CM

## 2020-10-16 DIAGNOSIS — M0609 Rheumatoid arthritis without rheumatoid factor, multiple sites: Secondary | ICD-10-CM | POA: Diagnosis not present

## 2020-10-16 DIAGNOSIS — I2510 Atherosclerotic heart disease of native coronary artery without angina pectoris: Secondary | ICD-10-CM | POA: Diagnosis not present

## 2020-10-16 NOTE — Progress Notes (Signed)
Subjective:  Patient ID: [name], male    DOB: November 04, 1933  Age: 86 y.o. MRN: [id number]  CC: Follow-up (6 weeks F/U)   HPI [name] presents for n/v and wt loss - resolved off Namenda Follow-up on depression-doing well Follow-up on hypertension-doing well Follow-up on low back pain/rheumatoid arthritis  Outpatient Medications Prior to Visit  Medication Sig Dispense Refill  . acetaminophen (TYLENOL) 500 MG tablet Take 1,000 mg by mouth every 6 (six) hours as needed.    [name] acidophilus (RISAQUAD) CAPS capsule Take 1 capsule by mouth daily.    [name] aspirin EC 81 MG tablet Take 81 mg by mouth daily.    [name] donepezil (ARICEPT) 10 MG tablet Take 1/2 tablet daily 45 tablet 3  . ELIQUIS 5 MG TABS tablet TAKE ONE TABLET TWICE DAILY 180 tablet 1  . escitalopram (LEXAPRO) 20 MG tablet Take 1 tablet daily 90 tablet 3  . ezetimibe (ZETIA) 10 MG tablet TAKE ONE-HALF TABLET DAILY 45 tablet 3  . lisinopril (ZESTRIL) 10 MG tablet Take 0.5 tablets (5 mg total) by mouth daily. 90 tablet 3  . metoprolol tartrate (LOPRESSOR) 25 MG tablet TAKE ONE TABLET TWICE DAILY 180 tablet 2  . nitroGLYCERIN (NITROSTAT) 0.4 MG SL tablet ONE TABLET UNDER TONGUE AS NEEDED FOR CHEST PAIN. MAY REPEAT IN 5 MINUTES AND AGAIN IN 5 MINUTES (TOTAL OF 3 TABLETS) 25 tablet 6  . omeprazole (PRILOSEC) 40 MG capsule TAKE ONE CAPSULE EACH DAY 90 capsule 3  . oxyCODONE-acetaminophen (PERCOCET/ROXICET) 5-325 MG tablet TAKE ONE TABLET EVERY EIGHT HOURS AS NEEDED FOR SEVERE PAIN 90 tablet 0  . Propylene Glycol (SYSTANE BALANCE OP) Place 2 drops into both eyes daily as needed (dry eyes).     [name] REPATHA SURECLICK 109 MG/ML SOAJ INJECT 1 PEN INTO SKIN EVERY 14 DAYS 2 mL 11  . rosuvastatin (CRESTOR) 5 MG tablet Take 1 tablet (5 mg total) by mouth daily. 90 tablet 3  . zolpidem (AMBIEN) 10 MG tablet TAKE 1/2 TO 1 TABLET AT BEDTIME AS NEEDED FOR SLEEP 30 tablet 5   No facility-administered medications prior to visit.    ROS: Review of  Systems  Constitutional: Negative for appetite change, fatigue and unexpected weight change.  HENT: Negative for congestion, nosebleeds, sneezing, sore throat and trouble swallowing.   Eyes: Negative for itching and visual disturbance.  Respiratory: Negative for cough.   Cardiovascular: Negative for chest pain, palpitations and leg swelling.  Gastrointestinal: Negative for abdominal distention, blood in stool, diarrhea, nausea and vomiting.  Genitourinary: Negative for frequency and hematuria.  Musculoskeletal: Positive for arthralgias, back pain and neck pain. Negative for gait problem and joint swelling.  Skin: Negative for rash.  Neurological: Negative for dizziness, tremors, speech difficulty and weakness.  Psychiatric/Behavioral: Positive for decreased concentration. Negative for agitation, dysphoric mood, sleep disturbance and suicidal ideas. The patient is not nervous/anxious.     Objective:  BP 132/78 (BP Location: Left Arm)   Pulse 71   Temp 98.2 F (36.8 C) (Oral)   Wt 186 lb 12.8 oz (84.7 kg)   SpO2 97%   BMI 25.33 kg/m   BP Readings from Last 3 Encounters:  10/16/20 132/78  09/23/20 134/82  09/19/20 108/80    Wt Readings from Last 3 Encounters:  10/16/20 186 lb 12.8 oz (84.7 kg)  09/23/20 183 lb (83 kg)  09/19/20 185 lb 5.4 oz (84.1 kg)    Physical Exam Constitutional:      General: He is not in  acute distress.    Appearance: He is well-developed.     Comments: NAD  Eyes:     Conjunctiva/sclera: Conjunctivae normal.     Pupils: Pupils are equal, round, and reactive to light.  Neck:     Thyroid: No thyromegaly.     Vascular: No JVD.  Cardiovascular:     Rate and Rhythm: Normal rate and regular rhythm.     Heart sounds: Normal heart sounds. No murmur heard. No friction rub. No gallop.   Pulmonary:     Effort: Pulmonary effort is normal. No respiratory distress.     Breath sounds: Normal breath sounds. No wheezing or rales.  Chest:     Chest wall: No  tenderness.  Abdominal:     General: Bowel sounds are normal. There is no distension.     Palpations: Abdomen is soft. There is no mass.     Tenderness: There is no abdominal tenderness. There is no guarding or rebound.  Musculoskeletal:        General: No tenderness. Normal range of motion.     Cervical back: Normal range of motion.  Lymphadenopathy:     Cervical: No cervical adenopathy.  Skin:    General: Skin is warm and dry.     Findings: No rash.  Neurological:     Mental Status: He is alert.     Cranial Nerves: No cranial nerve deficit.     Motor: No abnormal muscle tone.     Coordination: Coordination normal.     Gait: Gait normal.     Deep Tendon Reflexes: Reflexes are normal and symmetric.  Psychiatric:        Behavior: Behavior normal.        Thought Content: Thought content normal.     Lab Results  Component Value Date   WBC 7.6 08/31/2020   HGB 14.1 08/31/2020   HCT 43.4 08/31/2020   PLT 302 08/31/2020   GLUCOSE 123 (H) 08/31/2020   CHOL 197 08/04/2020   TRIG 254 (H) 08/04/2020   HDL 47 08/04/2020   LDLDIRECT 168.9 05/24/2013   LDLCALC 106 (H) 08/04/2020   ALT 14 08/31/2020   AST 22 08/31/2020   NA 138 08/31/2020   K 4.0 08/31/2020   CL 105 08/31/2020   CREATININE 0.92 08/31/2020   BUN 10 08/31/2020   CO2 24 08/31/2020   TSH 1.18 12/13/2017   PSA 2.46 02/21/2017   INR 0.92 09/13/2016   HGBA1C 6.4 08/30/2017    US Abdomen Complete  Result Date: 10/13/2020 CLINICAL DATA:  Abdominal bloating and nausea for 2 months. Unintentional weight loss. EXAM: ABDOMEN ULTRASOUND COMPLETE COMPARISON:  None. FINDINGS: Gallbladder: No gallstones or wall thickening visualized. No sonographic Murphy sign noted by sonographer. Common bile duct: Diameter: 5 mm, within normal limits. Liver: No focal lesion identified. Within normal limits in parenchymal echogenicity. Portal vein is patent on color Doppler imaging with normal direction of blood flow towards the liver. IVC:  No abnormality visualized. Pancreas: Not visualized due to overlying bowel gas. Spleen: Size and appearance within normal limits. Right Kidney: Length: 11.0 cm. Echogenicity within normal limits. No mass or hydronephrosis visualized. Left Kidney: Length: 11.8 cm. Echogenicity within normal limits. No mass or hydronephrosis visualized. Abdominal aorta: No aneurysm visualized. Other findings: None. IMPRESSION: Negative abdomen ultrasound. Electronically Signed   By: Marlaine Hind M.D.   On: 10/13/2020 10:14    Assessment & Plan:    [name], MD

## 2020-10-16 NOTE — Assessment & Plan Note (Signed)
Lisinopril, Toprol 

## 2020-10-16 NOTE — Assessment & Plan Note (Addendum)
Off Namenda due to n/v.  The symptoms resolved off Namenda.  Discussed no driving again.

## 2020-10-27 NOTE — Assessment & Plan Note (Signed)
Controlled on Lexapro

## 2020-10-27 NOTE — Assessment & Plan Note (Signed)
On Eliquis.  Rate controlled

## 2020-10-27 NOTE — Assessment & Plan Note (Signed)
Periodic exacerbation.  Percocet as needed

## 2020-10-27 NOTE — Assessment & Plan Note (Addendum)
No angina.  Continue with the Repatha injections. He is on Zetia, Eliquis, Toprol, Crestor as well

## 2020-10-27 NOTE — Assessment & Plan Note (Signed)
He is on Zetia; Repatha and Crestor Follow-up with Dr. Radford Pax

## 2020-10-31 ENCOUNTER — Other Ambulatory Visit: Payer: Medicare Other

## 2020-11-05 ENCOUNTER — Other Ambulatory Visit: Payer: Self-pay | Admitting: Cardiology

## 2020-11-14 ENCOUNTER — Telehealth: Payer: Self-pay | Admitting: Internal Medicine

## 2020-11-14 MED ORDER — AZITHROMYCIN 250 MG PO TABS: ORAL_TABLET | ORAL | 0 refills | Status: DC

## 2020-11-14 NOTE — Telephone Encounter (Signed)
[name] called.  Corie has a very sore throat and a fever of 101 this morning; he did not feel well yesterday.  No other symptoms.  No shortness of breath. He will take Tylenol this morning.  I will email a prescription for a Z-Pak.  They will find a place to get tested for Covid today or go to urgent care or ER if not much better in the next few hours or if worse.

## 2020-11-15 ENCOUNTER — Encounter: Payer: Self-pay | Admitting: Internal Medicine

## 2020-11-15 DIAGNOSIS — U071 COVID-19: Secondary | ICD-10-CM | POA: Insufficient documentation

## 2020-11-17 ENCOUNTER — Telehealth: Payer: Self-pay | Admitting: Internal Medicine

## 2020-11-17 NOTE — Telephone Encounter (Signed)
Called pt spoke w/wife she states he is doing a lot better... but did give MD advisement below.Marland KitchenRaechel Chute

## 2020-11-17 NOTE — Telephone Encounter (Signed)
He can take zinc 50 mg a day for 1 week, vitamin C 1000 mg daily for 1 week, vitamin D2 50,000 units weekly for 2 months, Quercetin 500 mg twice a day for 1 week.  Maintain good oral hydration and take Tylenol with high fever.  If you get short of breath or sick call [place] COVID-19 hotline at [phone] [facility]). Appointments required.  Feel better! Thanks

## 2020-11-17 NOTE — Telephone Encounter (Signed)
[facility]   Caller states her husband woke up with a fever, sore throat, and cough. Caller states her husband has COVID (dx today); woke up yesterday with sore throat. Started antibiotic yesterday (second dose this morning)-- slight improvement. 98 was most recent temperature (oral).  [facility] transferred to On Call Provider.    Antibody therapy being prioritized for unvaccinated individuals-- Continue monitoring for worsening s/sx. Follow up Monday morning. Shortness of breath?-- purchase pulse oximeter. 90% or below? Go to ED  Patient would like to know what options he has for treatment.  Please advise.

## 2020-11-26 DIAGNOSIS — M0609 Rheumatoid arthritis without rheumatoid factor, multiple sites: Secondary | ICD-10-CM | POA: Diagnosis not present

## 2020-12-10 ENCOUNTER — Other Ambulatory Visit: Payer: Self-pay | Admitting: Internal Medicine

## 2020-12-11 DIAGNOSIS — E663 Overweight: Secondary | ICD-10-CM | POA: Diagnosis not present

## 2020-12-11 DIAGNOSIS — M25562 Pain in left knee: Secondary | ICD-10-CM | POA: Diagnosis not present

## 2020-12-11 DIAGNOSIS — M0609 Rheumatoid arthritis without rheumatoid factor, multiple sites: Secondary | ICD-10-CM | POA: Diagnosis not present

## 2020-12-11 DIAGNOSIS — Z6825 Body mass index (BMI) 25.0-25.9, adult: Secondary | ICD-10-CM | POA: Diagnosis not present

## 2020-12-11 DIAGNOSIS — M5030 Other cervical disc degeneration, unspecified cervical region: Secondary | ICD-10-CM | POA: Diagnosis not present

## 2021-01-14 ENCOUNTER — Other Ambulatory Visit: Payer: Self-pay | Admitting: Internal Medicine

## 2021-02-15 ENCOUNTER — Emergency Department (HOSPITAL_COMMUNITY)
Admission: EM | Admit: 2021-02-15 | Discharge: 2021-02-15 | Disposition: A | Discharge status: Home / Self Care | Payer: Medicare Other | Attending: Emergency Medicine | Admitting: Emergency Medicine

## 2021-02-15 ENCOUNTER — Emergency Department (HOSPITAL_COMMUNITY): Payer: Medicare Other

## 2021-02-15 DIAGNOSIS — I2510 Atherosclerotic heart disease of native coronary artery without angina pectoris: Secondary | ICD-10-CM | POA: Diagnosis not present

## 2021-02-15 DIAGNOSIS — Z7982 Long term (current) use of aspirin: Secondary | ICD-10-CM | POA: Insufficient documentation

## 2021-02-15 DIAGNOSIS — Z79899 Other long term (current) drug therapy: Secondary | ICD-10-CM | POA: Diagnosis not present

## 2021-02-15 DIAGNOSIS — J45909 Unspecified asthma, uncomplicated: Secondary | ICD-10-CM | POA: Insufficient documentation

## 2021-02-15 DIAGNOSIS — R197 Diarrhea, unspecified: Secondary | ICD-10-CM | POA: Insufficient documentation

## 2021-02-15 DIAGNOSIS — R0902 Hypoxemia: Secondary | ICD-10-CM | POA: Diagnosis not present

## 2021-02-15 DIAGNOSIS — R42 Dizziness and giddiness: Secondary | ICD-10-CM | POA: Diagnosis not present

## 2021-02-15 DIAGNOSIS — Z7901 Long term (current) use of anticoagulants: Secondary | ICD-10-CM | POA: Diagnosis not present

## 2021-02-15 DIAGNOSIS — I480 Paroxysmal atrial fibrillation: Secondary | ICD-10-CM | POA: Insufficient documentation

## 2021-02-15 DIAGNOSIS — Z87891 Personal history of nicotine dependence: Secondary | ICD-10-CM | POA: Diagnosis not present

## 2021-02-15 DIAGNOSIS — I10 Essential (primary) hypertension: Secondary | ICD-10-CM | POA: Diagnosis not present

## 2021-02-15 DIAGNOSIS — R55 Syncope and collapse: Secondary | ICD-10-CM | POA: Insufficient documentation

## 2021-02-15 DIAGNOSIS — Z743 Need for continuous supervision: Secondary | ICD-10-CM | POA: Diagnosis not present

## 2021-02-15 DIAGNOSIS — Z8616 Personal history of COVID-19: Secondary | ICD-10-CM | POA: Diagnosis not present

## 2021-02-15 DIAGNOSIS — R404 Transient alteration of awareness: Secondary | ICD-10-CM | POA: Diagnosis not present

## 2021-02-15 DIAGNOSIS — R231 Pallor: Secondary | ICD-10-CM | POA: Diagnosis not present

## 2021-02-15 DIAGNOSIS — R6889 Other general symptoms and signs: Secondary | ICD-10-CM | POA: Diagnosis not present

## 2021-02-15 LAB — CBC WITH DIFFERENTIAL/PLATELET
Abs Immature Granulocytes: 0.02 10*3/uL (ref 0.00–0.07)
Basophils Absolute: 0.1 10*3/uL (ref 0.0–0.1)
Basophils Relative: 1 %
Eosinophils Absolute: 0.3 10*3/uL (ref 0.0–0.5)
Eosinophils Relative: 4 %
HCT: 39.9 % (ref 39.0–52.0)
Hemoglobin: 13.2 g/dL (ref 13.0–17.0)
Immature Granulocytes: 0 %
Lymphocytes Relative: 21 %
Lymphs Abs: 1.4 10*3/uL (ref 0.7–4.0)
MCH: 32.4 pg (ref 26.0–34.0)
MCHC: 33.1 g/dL (ref 30.0–36.0)
MCV: 97.8 fL (ref 80.0–100.0)
Monocytes Absolute: 0.6 10*3/uL (ref 0.1–1.0)
Monocytes Relative: 9 %
Neutro Abs: 4.4 10*3/uL (ref 1.7–7.7)
Neutrophils Relative %: 65 %
Platelets: 304 10*3/uL (ref 150–400)
RBC: 4.08 MIL/uL — ABNORMAL LOW (ref 4.22–5.81)
RDW: 14.6 % (ref 11.5–15.5)
WBC: 6.7 10*3/uL (ref 4.0–10.5)
nRBC: 0 % (ref 0.0–0.2)

## 2021-02-15 LAB — COMPREHENSIVE METABOLIC PANEL
ALT: 13 U/L (ref 0–44)
AST: 21 U/L (ref 15–41)
Albumin: 3.7 g/dL (ref 3.5–5.0)
Alkaline Phosphatase: 59 U/L (ref 38–126)
Anion gap: 5 (ref 5–15)
BUN: 15 mg/dL (ref 8–23)
CO2: 25 mmol/L (ref 22–32)
Calcium: 9.2 mg/dL (ref 8.9–10.3)
Chloride: 108 mmol/L (ref 98–111)
Creatinine, Ser: 0.82 mg/dL (ref 0.61–1.24)
GFR, Estimated: >60 mL/min (ref >60)
Glucose, Bld: 93 mg/dL (ref 70–99)
Potassium: 3.9 mmol/L (ref 3.5–5.1)
Sodium: 138 mmol/L (ref 135–145)
Total Bilirubin: 0.8 mg/dL (ref 0.3–1.2)
Total Protein: 7.3 g/dL (ref 6.5–8.1)

## 2021-02-15 LAB — TROPONIN I (HIGH SENSITIVITY)
Troponin I (High Sensitivity): 5 ng/L (ref <18)
Troponin I (High Sensitivity): 4 ng/L (ref <18)

## 2021-02-15 LAB — CBG MONITORING, ED: Glucose-Capillary: 87 mg/dL (ref 70–99)

## 2021-02-15 NOTE — ED Triage Notes (Signed)
Pt arrives from church, (lives at home with wife) via EMS. Pt reports has been feeling fine, but started feeling a little dizzy at church, went forward to alter as usual, knelt but when standing dizziness increased. Pt denies LOC, was able to stand and called 911. EMS reports pt was pale on arrival but alert a/o. Pt denies any cp, sob, n/v, change in oral intake or urinary habits.  BP 160/100 CBG 116 HR 70's O2 97% RA

## 2021-02-15 NOTE — ED Notes (Signed)
Pts hearing aids placed in specimen container with patients label

## 2021-02-15 NOTE — ED Provider Notes (Signed)
[facility] EMERGENCY DEPARTMENT Provider Note   CSN: [id number] Arrival date & time: 02/15/21  1041     History Chief Complaint  Patient presents with  . Dizziness    [name] is a 87 y.o. male.  Quin Hoop Hopping was at [employer].  He had gone up to the altar for communion.  He was kneeling at the ulcer, and when he got up, he began to feel dizzy.  He describes this feeling as a sensation of being discombobulated.  He knew that he had to sit down or he was going to fall.  His wife states that he was pale and diaphoretic.  EMS was called.  The patient states that he is currently feeling fine.  His routine was normal yesterday with the exception of having one episode of diarrhea.  He ate breakfast this morning.  The history is provided by the patient and the spouse.  Dizziness Quality:  Lightheadedness Severity:  Severe Onset quality:  Sudden Duration: minutes. Timing:  Constant Progression:  Resolved Chronicity:  New Context: standing up   Relieved by: sitting down. Associated symptoms: diarrhea (one episode yesterday)   Associated symptoms: no chest pain, no nausea, no palpitations, no shortness of breath, no vision changes and no vomiting   Risk factors: heart disease        Past Medical History:  Diagnosis Date  . CAD (coronary artery disease)    a. s/p inferior STEMI 01/08/2014; LHC 01/08/14: total RCA occlusion s/p 3.5x68mm Xience DES distal RCA and 3.5x28 mm DES mid RCA, 60-70% mid LAD stenosis, EF 55%.  . Complication of anesthesia    'long to wake up after back surgery " 09/2003  . Diverticulosis of colon   . GERD (gastroesophageal reflux disease)   . History of cellulitis    05-26-2015  LLE  . History of colon polyps    1998- benign/  2008 adenomatous   . History of kidney stones    2013  . History of squamous cell carcinoma excision    2013;  2015;  06-12-2015 right leg/  02/ and 05/ 2017  left ear and left leg  . Hydronephrosis, left   .  Hyperlipidemia   . Hypertension   . Migraine with aura   . OSA on CPAP 06/19/2015   Moderate OSA with AHI 18/hr  per study 05-20-2015  . Osteoarthritis   . Paroxysmal atrial fibrillation ([place]) 4/16   chads2vasc score is at least 4  . Premature atrial contractions   . RA (rheumatoid arthritis) [facility])    rheumatologist-  dr Leigh Aurora  . Sinusitis, chronic 10/17/2015  . Wears glasses   . Wears hearing aid    bilateral    Patient Active Problem List   Diagnosis Date Noted  . COVID-19 virus infection 11/15/2020  . Weight loss 09/29/2020  . Near syncope 09/04/2020  . Chest pain   . Dyspepsia   . Encounter for medication management 11/14/2019  . Angular stomatitis 07/09/2019  . Cervical radiculopathy 06/05/2019  . Pain in joint of left shoulder 05/31/2019  . Concussion 04/17/2019  . Hemoptysis 04/05/2019  . Confusion 04/05/2019  . Memory loss 12/21/2018  . Nausea & vomiting 02/07/2018  . Chills 02/07/2018  . Rash 02/07/2018  . Insomnia 09/22/2017  . Cervical spondylolysis 06/28/2017  . Hyperglycemia 06/22/2017  . Well adult exam 11/18/2016  . Hydronephrosis 11/18/2016  . UPJ obstruction, 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  . Allergic rhinitis 06/02/2016  . Asthma with exacerbation 06/02/2016  .  Sinusitis, chronic 10/17/2015  . OSA (obstructive sleep apnea) 06/19/2015  . Cellulitis and abscess of leg, except foot 05/26/2015  . Paroxysmal atrial fibrillation ([place]) 02/21/2015  . CAP (community acquired pneumonia) 01/02/2015  . Upper respiratory infection 12/02/2014  . Vivid dream 09/25/2014  . Actinic keratoses 04/26/2014  . CAD (coronary artery disease) 02/25/2014  . STEMI (ST elevation myocardial infarction) ([place]) 01/08/2014  . Rheumatoid bursitis of left elbow ([place]) 08/08/2013  . Situational depression 06/18/2013  . Neck pain 04/23/2012  . Acute abdominal pain in left flank 02/14/2012  . Hypertension 09/23/2011  . Chest pain, midsternal 09/13/2011  . Mixed hyperlipidemia  07/23/2010  . Palpitations 07/07/2010  . Rheumatoid arthritis ([place]) 06/04/2010  . PARESTHESIA 06/04/2010  . ARTHRALGIA 11/27/2009  . MYALGIA 08/21/2009  . ALLERGIC RHINITIS 06/11/2008  . OSTEOARTHRITIS 06/11/2008  . FATIGUE 06/11/2008  . LOW BACK PAIN 07/27/2007  . COLONIC POLYPS, HX OF 07/27/2007    Past Surgical History:  Procedure Laterality Date  . BACK SURGERY    . CARDIOVASCULAR STRESS TEST  11/21/2015   Low risk nuclear study w/ a small diaphragmatic attenuation artifact, no ischemia/  normal LV function and wall motion , ef 63%  . CATARACT EXTRACTION W/ INTRAOCULAR LENS  IMPLANT, BILATERAL  2015  . COLONOSCOPY  last one 06-09-2011  . CORONARY ANGIOPLASTY    . CYSTOSCOPY W/ RETROGRADES Left 07/12/2016   Procedure: CYSTOSCOPY WITH RETROGRADE PYELOGRAM LEFT URETERAL STENT;  Surgeon: Irine Seal, MD;  Location: [facility];  Service: Urology;  Laterality: Left;  . CYSTOSCOPY WITH RETROGRADE PYELOGRAM, URETEROSCOPY AND STENT PLACEMENT Left 08/11/2016   Procedure: CYSTOSCOPY WITH RETROGRADE PYELOGRAM,  DIAGNOSTIC URETEROSCOPY , STENT EXCHANGE;  Surgeon: Alexis Frock, MD;  Location: [facility];  Service: Urology;  Laterality: Left;  . 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   severe fibromatosis palm and fingers  . LEFT HEART CATHETERIZATION WITH CORONARY ANGIOGRAM N/A 01/08/2014   Procedure: LEFT HEART CATHETERIZATION WITH CORONARY ANGIOGRAM;  Surgeon: Troy Sine, MD;  Location: [facility] CATH LAB;  Service: Cardiovascular;  Laterality:N/A;  total/ subtotal RCA/  mLAD 61-44% w/ mid systolic bridging/  preserved global LVF, ef 55%  . MOHS SURGERY  x2  feb and may 2017   left ear /  left leg  ([facility])  . PERCUTANEOUS CORONARY STENT INTERVENTION (PCI-S)  01/08/2014   Procedure: PERCUTANEOUS CORONARY STENT INTERVENTION (PCI-S);  Surgeon: Troy Sine, MD;  Location: [facility] CATH LAB;  Service: Cardiovascular;;  DES to mid and distal RCA  . POSTERIOR LUMBAR FUSION  10/01/2003    and Laminectomy/ diskectomy  L4 -- S1  . ROBOT ASSISTED PYELOPLASTY Left 09/15/2016   Procedure: XI ROBOTIC ASSISTED PYELOPLASTY;  Surgeon: Alexis Frock, MD;  Location: [facility];  Service: Urology;  Laterality: Left;  . TONSILLECTOMY    . TRANSTHORACIC ECHOCARDIOGRAM  02/23/2016   mild LVH,  ef 50-55%,  grade 1 diastolic dysfunction/  mild to moderate AV calcification w/ no stenosis or regurg./  trivial MR and TR/  mild PR       Family History  Problem Relation Age of Onset  . Hypertension Mother   . Heart disease Father   . Colon cancer Neg Hx     Social History   Tobacco Use  . Smoking status: Former Smoker    Years: 10.00    Types: Cigarettes    Quit date: 08/09/1968    Years since quitting: 52.5  . Smokeless tobacco: Never Used  Vaping Use  . Vaping Use:  Never used  Substance Use Topics  . Alcohol use: Yes    Alcohol/week: 2.0 standard drinks    Types: 2 Shots of liquor per week    Comment: daily  . Drug use: No    Home Medications Prior to Admission medications   Medication Sig Start Date End Date Taking? Authorizing Provider  acetaminophen (TYLENOL) 500 MG tablet Take 1,000 mg by mouth every 6 (six) hours as needed.    [provider]  acidophilus (RISAQUAD) CAPS capsule Take 1 capsule by mouth daily.    [provider]  aspirin EC 81 MG tablet Take 81 mg by mouth daily.    [provider]  azithromycin (ZITHROMAX Z-PAK) 250 MG tablet As directed 11/14/20   Plotnikov, Evie Lacks, MD  donepezil (ARICEPT) 10 MG tablet Take 1/2 tablet daily 08/18/20   Cameron Sprang, MD  ELIQUIS 5 MG TABS tablet TAKE ONE TABLET TWICE DAILY 08/18/20   Sueanne Margarita, MD  escitalopram (LEXAPRO) 20 MG tablet Take 1 tablet daily 09/04/20   Plotnikov, Evie Lacks, MD  ezetimibe (ZETIA) 10 MG tablet TAKE 1/2 TABLET DAILY 12/10/20   Plotnikov, Evie Lacks, MD  lisinopril (ZESTRIL) 10 MG tablet TAKE ONE TABLET DAILY 11/05/20   Sueanne Margarita, MD  metoprolol tartrate  (LOPRESSOR) 25 MG tablet TAKE ONE TABLET TWICE DAILY 02/21/20   Sueanne Margarita, MD  nitroGLYCERIN (NITROSTAT) 0.4 MG SL tablet ONE TABLET UNDER TONGUE AS NEEDED FOR CHEST PAIN. MAY REPEAT IN 5 MINUTES AND AGAIN IN 5 MINUTES (TOTAL OF 3 TABLETS) 03/10/18   Allred, Jeneen Rinks, MD  omeprazole (PRILOSEC) 40 MG capsule TAKE ONE CAPSULE EACH DAY 12/03/19   Plotnikov, Evie Lacks, MD  oxyCODONE-acetaminophen (PERCOCET/ROXICET) 5-325 MG tablet TAKE ONE TABLET EVERY EIGHT HOURS AS NEEDED FOR SEVERE PAIN 07/13/20   Plotnikov, Evie Lacks, MD  Propylene Glycol (SYSTANE BALANCE OP) Place 2 drops into both eyes daily as needed (dry eyes).     [provider]  REPATHA SURECLICK 607 MG/ML SOAJ INJECT 1 PEN INTO SKIN EVERY 14 DAYS 12/31/19   Sueanne Margarita, MD  rosuvastatin (CRESTOR) 5 MG tablet TAKE ONE TABLET DAILY 11/05/20   Sueanne Margarita, MD  zolpidem (AMBIEN) 10 MG tablet TAKE 1/2 TO 1 TABLET AT BEDTIME AS NEEDED FOR SLEEP 01/18/21   Plotnikov, Evie Lacks, MD    Allergies    Methotrexate, Aricept Reather Littler hcl], Crestor [rosuvastatin calcium], Lisinopril, Namenda [memantine], Atorvastatin, and Pravastatin sodium  Review of Systems   Review of Systems  Constitutional: Negative for chills and fever.  HENT: Negative for ear pain and sore throat.   Eyes: Negative for pain and visual disturbance.  Respiratory: Negative for cough and shortness of breath.   Cardiovascular: Negative for chest pain and palpitations.  Gastrointestinal: Positive for diarrhea (one episode yesterday). Negative for abdominal pain, nausea and vomiting.  Genitourinary: Negative for dysuria and hematuria.  Musculoskeletal: Negative for arthralgias and back pain.  Skin: Negative for color change and rash.  Neurological: Positive for dizziness. Negative for seizures and syncope.  All other systems reviewed and are negative.   Physical Exam Updated Vital Signs BP (!) 146/85   Pulse 67   Temp 97.6 F (36.4 C) (Oral)   Resp 15   SpO2  98%   Physical Exam Vitals and nursing note reviewed.  Constitutional:      Appearance: He is well-developed.  HENT:     Head: Normocephalic and atraumatic.  Eyes:     Conjunctiva/sclera: Conjunctivae  normal.  Cardiovascular:     Rate and Rhythm: Normal rate and regular rhythm.     Heart sounds: No murmur heard.   Pulmonary:     Effort: Pulmonary effort is normal. No respiratory distress.     Breath sounds: Normal breath sounds.  Abdominal:     Palpations: Abdomen is soft.     Tenderness: There is no abdominal tenderness.  Musculoskeletal:     Cervical back: Neck supple.  Skin:    General: Skin is warm and dry.  Neurological:     General: No focal deficit present.     Mental Status: He is alert.     Cranial Nerves: No cranial nerve deficit.     Sensory: No sensory deficit.     Motor: No weakness.     Coordination: Coordination normal.  Psychiatric:        Mood and Affect: Mood normal.     ED Results / Procedures / Treatments   Labs (all labs ordered are listed, but only abnormal results are displayed) Labs Reviewed  CBC WITH DIFFERENTIAL/PLATELET - Abnormal; Notable for the following components:      Result Value   RBC 4.08 (*)    All other components within normal limits  COMPREHENSIVE METABOLIC PANEL  CBG MONITORING, ED  TROPONIN I (HIGH SENSITIVITY)  TROPONIN I (HIGH SENSITIVITY)    EKG EKG Interpretation  Date/Time:  Sunday February 15 2021 10:47:33 EDT Ventricular Rate:  66 PR Interval:  162 QRS Duration: 106 QT Interval:  435 QTC Calculation: 456 R Axis:   30 Text Interpretation: Sinus rhythm normal axis no acute ischemia Confirmed by Lorre Munroe ([id number]) on 02/15/2021 11:23:11 AM   Radiology DG Chest 2 View  Result Date: 02/15/2021 CLINICAL DATA:  Presyncope EXAM: CHEST - 2 VIEW COMPARISON:  None. FINDINGS: Normal mediastinum and cardiac silhouette. Normal pulmonary vasculature. No evidence of effusion, infiltrate, or pneumothorax. No acute bony  abnormality. IMPRESSION: No acute cardiopulmonary process. Electronically Signed   By: Suzy Bouchard M.D.   On: 02/15/2021 12:31    Procedures Procedures   Medications Ordered in ED Medications - No data to display  ED Course  I have reviewed the triage vital signs and the nursing notes.  Pertinent labs & imaging results that were available during my care of the patient were reviewed by me and considered in my medical decision making (see chart for details).    MDM Rules/Calculators/A&P                          Quin Hoop Geissinger had an episode of what sounds like near syncope.  It happened with a positional change from kneeling to standing, and based on his normal work-up, I think the most likely etiology of his symptoms is an orthostatic hypotension.  He was evaluated for any evidence of ACS, and his work-up was reassuring.  No focal neurologic deficits or indication for neurologic testing.  No electrolyte abnormalities or anemia at the root of his symptoms.  He is stable for discharge home with close follow-up. Final Clinical Impression(s) / ED Diagnoses Final diagnoses:  Near syncope    Rx / DC Orders ED Discharge Orders    None       Arnaldo Natal, MD 02/15/21 [phone]

## 2021-02-15 NOTE — ED Notes (Signed)
Pt CBG 87. RN Zoe and Jen notified.

## 2021-02-16 DIAGNOSIS — L111 Transient acantholytic dermatosis [Grover]: Secondary | ICD-10-CM | POA: Diagnosis not present

## 2021-02-16 DIAGNOSIS — L821 Other seborrheic keratosis: Secondary | ICD-10-CM | POA: Diagnosis not present

## 2021-02-16 DIAGNOSIS — Z85828 Personal history of other malignant neoplasm of skin: Secondary | ICD-10-CM | POA: Diagnosis not present

## 2021-02-16 DIAGNOSIS — D1801 Hemangioma of skin and subcutaneous tissue: Secondary | ICD-10-CM | POA: Diagnosis not present

## 2021-02-16 DIAGNOSIS — L570 Actinic keratosis: Secondary | ICD-10-CM | POA: Diagnosis not present

## 2021-02-23 ENCOUNTER — Ambulatory Visit: Payer: Medicare Other | Admitting: Internal Medicine

## 2021-02-24 DIAGNOSIS — M0609 Rheumatoid arthritis without rheumatoid factor, multiple sites: Secondary | ICD-10-CM | POA: Diagnosis not present

## 2021-03-02 ENCOUNTER — Encounter: Payer: Self-pay | Admitting: Internal Medicine

## 2021-03-02 ENCOUNTER — Other Ambulatory Visit: Payer: Self-pay

## 2021-03-02 ENCOUNTER — Ambulatory Visit: Payer: Medicare Other | Admitting: Internal Medicine

## 2021-03-02 DIAGNOSIS — F4321 Adjustment disorder with depressed mood: Secondary | ICD-10-CM

## 2021-03-02 DIAGNOSIS — R634 Abnormal weight loss: Secondary | ICD-10-CM

## 2021-03-02 DIAGNOSIS — M2550 Pain in unspecified joint: Secondary | ICD-10-CM

## 2021-03-02 DIAGNOSIS — I2510 Atherosclerotic heart disease of native coronary artery without angina pectoris: Secondary | ICD-10-CM

## 2021-03-02 DIAGNOSIS — I700 Atherosclerosis of aorta: Secondary | ICD-10-CM | POA: Insufficient documentation

## 2021-03-02 NOTE — Assessment & Plan Note (Signed)
Moving to [facility]

## 2021-03-02 NOTE — Progress Notes (Signed)
Subjective:  Patient ID: [name], male    DOB: 1933/11/02  Age: 87 y.o. MRN: [id number]  CC: Follow-up (4 month f/u)   HPI RYANE CANAVAN presents for dementia, RA, A fib f/u  Outpatient Medications Prior to Visit  Medication Sig Dispense Refill  . acetaminophen (TYLENOL) 500 MG tablet Take 1,000 mg by mouth every 6 (six) hours as needed.    [name] acidophilus (RISAQUAD) CAPS capsule Take 1 capsule by mouth daily.    [name] aspirin EC 81 MG tablet Take 81 mg by mouth daily.    [name] azithromycin (ZITHROMAX Z-PAK) 250 MG tablet As directed 6 tablet 0  . donepezil (ARICEPT) 10 MG tablet Take 1/2 tablet daily 45 tablet 3  . ELIQUIS 5 MG TABS tablet TAKE ONE TABLET TWICE DAILY 180 tablet 1  . escitalopram (LEXAPRO) 20 MG tablet Take 1 tablet daily 90 tablet 3  . ezetimibe (ZETIA) 10 MG tablet TAKE 1/2 TABLET DAILY 45 tablet 3  . lisinopril (ZESTRIL) 10 MG tablet TAKE ONE TABLET DAILY 90 tablet 3  . metoprolol tartrate (LOPRESSOR) 25 MG tablet TAKE ONE TABLET TWICE DAILY 180 tablet 2  . nitroGLYCERIN (NITROSTAT) 0.4 MG SL tablet ONE TABLET UNDER TONGUE AS NEEDED FOR CHEST PAIN. MAY REPEAT IN 5 MINUTES AND AGAIN IN 5 MINUTES (TOTAL OF 3 TABLETS) 25 tablet 6  . omeprazole (PRILOSEC) 40 MG capsule TAKE ONE CAPSULE EACH DAY 90 capsule 3  . oxyCODONE-acetaminophen (PERCOCET/ROXICET) 5-325 MG tablet TAKE ONE TABLET EVERY EIGHT HOURS AS NEEDED FOR SEVERE PAIN 90 tablet 0  . Propylene Glycol (SYSTANE BALANCE OP) Place 2 drops into both eyes daily as needed (dry eyes).     [name] REPATHA SURECLICK 778 MG/ML SOAJ INJECT 1 PEN INTO SKIN EVERY 14 DAYS 2 mL 11  . rosuvastatin (CRESTOR) 5 MG tablet TAKE ONE TABLET DAILY 90 tablet 3  . zolpidem (AMBIEN) 10 MG tablet TAKE 1/2 TO 1 TABLET AT BEDTIME AS NEEDED FOR SLEEP 30 tablet 3   No facility-administered medications prior to visit.    ROS: Review of Systems  Constitutional: Negative for appetite change, fatigue and unexpected weight change.  HENT: Negative for  congestion, nosebleeds, sneezing, sore throat and trouble swallowing.   Eyes: Negative for itching and visual disturbance.  Respiratory: Negative for cough.   Cardiovascular: Negative for chest pain, palpitations and leg swelling.  Gastrointestinal: Negative for abdominal distention, blood in stool, diarrhea and nausea.  Genitourinary: Negative for frequency and hematuria.  Musculoskeletal: Negative for back pain, gait problem, joint swelling and neck pain.  Skin: Negative for rash.  Neurological: Negative for dizziness, tremors, speech difficulty and weakness.  Psychiatric/Behavioral: Negative for agitation, dysphoric mood and sleep disturbance. The patient is not nervous/anxious.     Objective:  BP 102/62 (BP Location: Left Arm)   Pulse 86   Temp 97.7 F (36.5 C) (Oral)   Ht 6' (1.829 m)   Wt 193 lb (87.5 kg)   SpO2 97%   BMI 26.18 kg/m   BP Readings from Last 3 Encounters:  03/02/21 102/62  02/15/21 128/73  10/16/20 132/78    Wt Readings from Last 3 Encounters:  03/02/21 193 lb (87.5 kg)  10/16/20 186 lb 12.8 oz (84.7 kg)  09/23/20 183 lb (83 kg)    Physical Exam Constitutional:      General: He is not in acute distress.    Appearance: He is well-developed.     Comments: NAD  Eyes:     Conjunctiva/sclera: Conjunctivae  normal.     Pupils: Pupils are equal, round, and reactive to light.  Neck:     Thyroid: No thyromegaly.     Vascular: No JVD.  Cardiovascular:     Rate and Rhythm: Normal rate and regular rhythm.     Heart sounds: Normal heart sounds. No murmur heard. No friction rub. No gallop.   Pulmonary:     Effort: Pulmonary effort is normal. No respiratory distress.     Breath sounds: Normal breath sounds. No wheezing or rales.  Chest:     Chest wall: No tenderness.  Abdominal:     General: Bowel sounds are normal. There is no distension.     Palpations: Abdomen is soft. There is no mass.     Tenderness: There is no abdominal tenderness. There is no  guarding or rebound.  Musculoskeletal:        General: No tenderness. Normal range of motion.     Cervical back: Normal range of motion.  Lymphadenopathy:     Cervical: No cervical adenopathy.  Skin:    General: Skin is warm and dry.     Findings: No rash.  Neurological:     Mental Status: He is alert and oriented to person, place, and time.     Cranial Nerves: No cranial nerve deficit.     Motor: No abnormal muscle tone.     Coordination: Coordination normal.     Gait: Gait normal.     Deep Tendon Reflexes: Reflexes are normal and symmetric.  Psychiatric:        Behavior: Behavior normal.        Thought Content: Thought content normal.        Judgment: Judgment normal.     Lab Results  Component Value Date   WBC 6.7 02/15/2021   HGB 13.2 02/15/2021   HCT 39.9 02/15/2021   PLT 304 02/15/2021   GLUCOSE 93 02/15/2021   CHOL 197 08/04/2020   TRIG 254 (H) 08/04/2020   HDL 47 08/04/2020   LDLDIRECT 168.9 05/24/2013   LDLCALC 106 (H) 08/04/2020   ALT 13 02/15/2021   AST 21 02/15/2021   NA 138 02/15/2021   K 3.9 02/15/2021   CL 108 02/15/2021   CREATININE 0.82 02/15/2021   BUN 15 02/15/2021   CO2 25 02/15/2021   TSH 1.18 12/13/2017   PSA 2.46 02/21/2017   INR 0.92 09/13/2016   HGBA1C 6.4 08/30/2017    DG Chest 2 View  Result Date: 02/15/2021 CLINICAL DATA:  Presyncope EXAM: CHEST - 2 VIEW COMPARISON:  None. FINDINGS: Normal mediastinum and cardiac silhouette. Normal pulmonary vasculature. No evidence of effusion, infiltrate, or pneumothorax. No acute bony abnormality. IMPRESSION: No acute cardiopulmonary process. Electronically Signed   By: Suzy Bouchard M.D.   On: 02/15/2021 12:31    Assessment & Plan:    [name], MD

## 2021-03-02 NOTE — Assessment & Plan Note (Signed)
On Crestor 

## 2021-03-02 NOTE — Assessment & Plan Note (Signed)
Zetia, Eliquis, Toprol, Crestor and Repatha

## 2021-03-02 NOTE — Assessment & Plan Note (Signed)
Treat RA

## 2021-03-02 NOTE — Assessment & Plan Note (Signed)
Wt Readings from Last 3 Encounters:  03/02/21 193 lb (87.5 kg)  10/16/20 186 lb 12.8 oz (84.7 kg)  09/23/20 183 lb (83 kg)

## 2021-03-09 ENCOUNTER — Other Ambulatory Visit: Payer: Self-pay | Admitting: Cardiology

## 2021-03-10 ENCOUNTER — Ambulatory Visit: Payer: Medicare Other | Admitting: Internal Medicine

## 2021-03-11 ENCOUNTER — Ambulatory Visit: Payer: Medicare Other | Admitting: Internal Medicine

## 2021-03-25 ENCOUNTER — Telehealth: Payer: Self-pay | Admitting: Pharmacist

## 2021-03-25 NOTE — Progress Notes (Signed)
Chronic Care Management Pharmacy Assistant   Name: [name]  MRN: [id number] DOB: 05/02/1933  Reason for Encounter: Initial Questions Appointment: 03/26/21 @ 10:30 am   Recent office visits:  03/02/21 Plotnikov (PCP) - Coronary Artery disease. No med changes. F/u 3 months.  10/16/20 Plotnikov (PCP) - F/u Memory Loss. No med changes. F/u 4 months.   Recent consult visits:  11/26/20 [facility] (Rheumatology) - [facility] visits:  Medication Reconciliation was completed by comparing discharge summary, patient's EMR and Pharmacy list, and upon discussion with patient.  Admitted to the hospital on 02/15/21 due to Syncope. Discharge date was 02/15/21. Discharged from  [facility].    Medications that remain the same after Hospital Discharge:??  -All other medications will remain the same.    Medications: Outpatient Encounter Medications as of 03/25/2021  Medication Sig  . acetaminophen (TYLENOL) 500 MG tablet Take 1,000 mg by mouth every 6 (six) hours as needed.  [name] acidophilus (RISAQUAD) CAPS capsule Take 1 capsule by mouth daily.  [name] aspirin EC 81 MG tablet Take 81 mg by mouth daily.  [name] azithromycin (ZITHROMAX Z-PAK) 250 MG tablet As directed  . donepezil (ARICEPT) 10 MG tablet Take 1/2 tablet daily  . ELIQUIS 5 MG TABS tablet TAKE ONE TABLET TWICE DAILY  . escitalopram (LEXAPRO) 20 MG tablet Take 1 tablet daily  . ezetimibe (ZETIA) 10 MG tablet TAKE 1/2 TABLET DAILY  . lisinopril (ZESTRIL) 10 MG tablet TAKE ONE TABLET DAILY  . metoprolol tartrate (LOPRESSOR) 25 MG tablet TAKE ONE TABLET TWICE DAILY  . nitroGLYCERIN (NITROSTAT) 0.4 MG SL tablet ONE TABLET UNDER TONGUE AS NEEDED FOR CHEST PAIN. MAY REPEAT IN 5 MINUTES AND AGAIN IN 5 MINUTES (TOTAL OF 3 TABLETS)  . omeprazole (PRILOSEC) 40 MG capsule TAKE ONE CAPSULE EACH DAY  . oxyCODONE-acetaminophen (PERCOCET/ROXICET) 5-325 MG tablet TAKE ONE TABLET EVERY EIGHT HOURS AS NEEDED FOR SEVERE PAIN  .  Propylene Glycol (SYSTANE BALANCE OP) Place 2 drops into both eyes daily as needed (dry eyes).   [name] REPATHA SURECLICK 270 MG/ML SOAJ INJECT 1 PEN INTO SKIN EVERY 14 DAYS  . rosuvastatin (CRESTOR) 5 MG tablet TAKE ONE TABLET DAILY  . zolpidem (AMBIEN) 10 MG tablet TAKE 1/2 TO 1 TABLET AT BEDTIME AS NEEDED FOR SLEEP   No facility-administered encounter medications on file as of 03/25/2021.    Have you seen any other providers since your last visit?  Patient wife stated he has not seen any other providers.  Any changes in your medications or health?  Patient wife states no recent changes.  Any side effects from any medications? Patient wife states no side effects at this time.  Do you have any symptoms or problems not managed by your medications?  Patient wife states no problems at this time.  Any concerns about your health right now?  Patient wife states he is doing well to be his age.  Has your provider asked that you check blood pressure, blood sugar, or follow special diet at home?  Patient wife states he does not check his BP or glucose. Patient does not follow a special diet at this time.  Do you get any type of exercise on a regular basis?  Patient wife states they walk the dog 3x day and recently started a exercise class since moving to [facility].  Can you think of a goal you would like to reach for your health?  Patient states she would like for him to  remain calm and happy, patient don't understand why they moved to a retirement home.  Do you have any problems getting your medications?  Patient wife states she don't have any problems getting his meds.  Is there anything that you would like to discuss during the appointment?  Patient wife is excited about having there meds package, she states that its hard for her to fill two pill boxes and remember what meds have been taken. Patient wife also states that he is out of several medications and need refills.  Please bring  medications and supplements to appointment   Star Rating Drugs: lisinopril - last fill 11/05/20 90D (patient has not refill) rosuvastatin - last fill 01/29/21 30D (patient has not refill)   Orinda Kenner, [place] Clinical Pharmacists Assistant [phone]  Time Spent: [phone]

## 2021-03-26 ENCOUNTER — Other Ambulatory Visit: Payer: Self-pay

## 2021-03-26 ENCOUNTER — Telehealth: Payer: Self-pay | Admitting: Pharmacist

## 2021-03-26 ENCOUNTER — Ambulatory Visit (INDEPENDENT_AMBULATORY_CARE_PROVIDER_SITE_OTHER): Payer: Medicare Other | Admitting: Pharmacist

## 2021-03-26 DIAGNOSIS — I2510 Atherosclerotic heart disease of native coronary artery without angina pectoris: Secondary | ICD-10-CM | POA: Diagnosis not present

## 2021-03-26 DIAGNOSIS — F4321 Adjustment disorder with depressed mood: Secondary | ICD-10-CM

## 2021-03-26 DIAGNOSIS — R413 Other amnesia: Secondary | ICD-10-CM

## 2021-03-26 DIAGNOSIS — E782 Mixed hyperlipidemia: Secondary | ICD-10-CM

## 2021-03-26 DIAGNOSIS — I480 Paroxysmal atrial fibrillation: Secondary | ICD-10-CM | POA: Diagnosis not present

## 2021-03-26 DIAGNOSIS — I10 Essential (primary) hypertension: Secondary | ICD-10-CM

## 2021-03-26 NOTE — Progress Notes (Addendum)
    Chronic Care Management Pharmacy Assistant   Name: [name]  MRN: [id number] DOB: 02-10-1933  Made a call this morning to [name] to get a pill count on these medications: [place] Clinical Pharmacist Assistant [phone]  Time spent:23      Chronic Care Management Pharmacy Assistant   Name: [name]  MRN: [id number] DOB: 10/12/1933   Reason for Encounter: [name]    Medications: Outpatient Encounter Medications as of 03/26/2021  Medication Sig  . acetaminophen (TYLENOL) 500 MG tablet Take 1,000 mg by mouth every 6 (six) hours as needed.  [name] acidophilus (RISAQUAD) CAPS capsule Take 1 capsule by mouth daily.  [name] aspirin EC 81 MG tablet Take 81 mg by mouth daily.  [name] azithromycin (ZITHROMAX Z-PAK) 250 MG tablet As directed  . donepezil (ARICEPT) 10 MG tablet Take 1/2 tablet daily  . ELIQUIS 5 MG TABS tablet TAKE ONE TABLET TWICE DAILY  . escitalopram (LEXAPRO) 20 MG tablet Take 1 tablet daily  . ezetimibe (ZETIA) 10 MG tablet TAKE 1/2 TABLET DAILY  . lisinopril (ZESTRIL) 10 MG tablet TAKE ONE TABLET DAILY  . metoprolol tartrate (LOPRESSOR) 25 MG tablet TAKE ONE TABLET TWICE DAILY  . nitroGLYCERIN (NITROSTAT) 0.4 MG SL tablet ONE TABLET UNDER TONGUE AS NEEDED FOR CHEST PAIN. MAY REPEAT IN 5 MINUTES AND AGAIN IN 5 MINUTES (TOTAL OF 3 TABLETS)  . omeprazole (PRILOSEC) 40 MG capsule TAKE ONE CAPSULE EACH DAY  . oxyCODONE-acetaminophen (PERCOCET/ROXICET) 5-325 MG tablet TAKE ONE TABLET EVERY EIGHT HOURS AS NEEDED FOR SEVERE PAIN  . Propylene Glycol (SYSTANE BALANCE OP) Place 2 drops into both eyes daily as needed (dry eyes).   [name] REPATHA SURECLICK 122 MG/ML SOAJ INJECT 1 PEN INTO SKIN EVERY 14 DAYS  . rosuvastatin (CRESTOR) 5 MG tablet TAKE ONE TABLET DAILY  . zolpidem (AMBIEN) 10 MG tablet TAKE 1/2 TO 1 TABLET AT BEDTIME AS NEEDED FOR SLEEP   No facility-administered encounter medications  on file as of 03/26/2021.    Pharmacist Review The patient had an initial visit with Clinical pharmacist [name] on 03/26/21. Upon completion of the visit the patient has agreed to try [facility] services for their dispensing and delivery of medications. Per clinical pharmacist request I completed an on-boarding form with the list of the patients medications, current pharmacy, demographics, allergies and insurance information. The form was them forward to clinical pharmacist for review.  [place] Pharmacist Assistant [phone]  Time spent:50

## 2021-03-26 NOTE — Progress Notes (Addendum)
Chronic Care Management Pharmacy Note  03/29/2021 Name:  [name] MRN:  [id number] DOB:  01-Nov-1933  Subjective: [name] is an 87 y.o. year old male who is a primary patient of Plotnikov, Evie Lacks, MD.  The CCM team was consulted for assistance with disease management and care coordination needs.    Engaged with patient face to face for initial visit in response to provider referral for pharmacy case management and/or care coordination services.   Consent to Services:  The patient was given the following information about Chronic Care Management services today, agreed to services, and gave verbal consent: 1. CCM service includes personalized support from designated clinical staff supervised by the primary care provider, including individualized plan of care and coordination with other care providers 2. 24/7 contact phone numbers for assistance for urgent and routine care needs. 3. Service will only be billed when office clinical staff spend 20 minutes or more in a month to coordinate care. 4. Only one practitioner may furnish and bill the service in a calendar month. 5.The patient may stop CCM services at any time (effective at the end of the month) by phone call to the office staff. 6. The patient will be responsible for cost sharing (co-pay) of up to 20% of the service fee (after annual deductible is met). Patient agreed to services and consent obtained.  Patient Care Team: Plotnikov, Evie Lacks, MD as PCP - General Sueanne Margarita, MD as PCP - Cardiology (Cardiology) Thompson Grayer, MD as PCP - Electrophysiology (Cardiology) Irene Shipper, MD (Gastroenterology) Thompson Grayer, MD (Cardiology) Hennie Duos, MD as Consulting Physician (Rheumatology) Alexis Frock, MD as Consulting Physician (Urology) Katy Apo, MD as Consulting Physician (Ophthalmology) Mathis Fare (Dentistry) Tiajuana Amass, MD as Referring Physician (Allergy and Immunology) Cameron Sprang, MD as  Consulting Physician (Neurology) Charlton Haws, [facility] as Pharmacist (Pharmacist)   Patient has dementia and is represented by his caregiver and wife in visit today. They recently moved to an independent living community. His wife [name] takes care of his medications and appointments.   Recent office visits: 03/02/21 Plotnikov (PCP) - Coronary Artery disease. No med changes. F/u 3 months.   10/16/20 Plotnikov (PCP) - F/u Memory Loss. No med changes. F/u 4 months.  Recent consult visits: 11/26/20 [facility] (Rheumatology) - Rheumatoid Arthritis  09/19/20 Dr Radford Pax (cardiology): f/u OSA, CAD. Noncompliance with Repatha. Repeat lipid panel, ALT 6 weeks - never done.  Hospital visits: Medication Reconciliation was completed by comparing discharge summary, patient's EMR and Pharmacy list, and upon discussion with patient.   Admitted to the ED on 02/15/21 due to Syncope. Discharge date was 02/15/21. Discharged from  [facility] ED.   -likely orthostasis   Medications that remain the same after Hospital Discharge:??  -All other medications will remain the same.  Objective:  Lab Results  Component Value Date   CREATININE 0.82 02/15/2021   BUN 15 02/15/2021   GFR 82.58 04/06/2019   GFRNONAA >60 02/15/2021   GFRAA >60 03/14/2020   NA 138 02/15/2021   K 3.9 02/15/2021   CALCIUM 9.2 02/15/2021   CO2 25 02/15/2021   GLUCOSE 93 02/15/2021    Lab Results  Component Value Date/Time   HGBA1C 6.4 08/30/2017 07:24 AM   HGBA1C 6.8 (H) 06/09/2017 11:55 AM   GFR 82.58 04/06/2019 11:58 AM   GFR 79.61 02/07/2018 08:39 AM    Last diabetic Eye exam: No results found for: HMDIABEYEEXA  Last diabetic Foot exam: No results  found for: HMDIABFOOTEX   Lab Results  Component Value Date   CHOL 197 08/04/2020   HDL 47 08/04/2020   LDLCALC 106 (H) 08/04/2020   LDLDIRECT 168.9 05/24/2013   TRIG 254 (H) 08/04/2020   CHOLHDL 4.2 08/04/2020    Hepatic Function Latest Ref Rng & Units 02/15/2021 08/31/2020  03/03/2020  Total Protein 6.5 - 8.1 g/dL 7.3 6.8 7.0  Albumin 3.5 - 5.0 g/dL 3.7 3.7 4.0  AST 15 - 41 U/L '21 22 13  ' ALT 0 - 44 U/L '13 14 11  ' Alk Phosphatase 38 - 126 U/L 59 54 69  Total Bilirubin 0.3 - 1.2 mg/dL 0.8 1.0 0.4  Bilirubin, Direct 0.0 - 0.2 mg/dL - 0.1 0.10    Lab Results  Component Value Date/Time   TSH 1.18 12/13/2017 08:49 AM   TSH 1.015 10/07/2015 09:19 AM    CBC Latest Ref Rng & Units 02/15/2021 08/31/2020 03/14/2020  WBC 4.0 - 10.5 K/uL 6.7 7.6 8.6  Hemoglobin 13.0 - 17.0 g/dL 13.2 14.1 13.8  Hematocrit 39.0 - 52.0 % 39.9 43.4 42.3  Platelets 150 - 400 K/uL 304 302 299    Lab Results  Component Value Date/Time   VD25OH 48 04/13/2012 07:38 AM   CHA2DS2-VASc Score = 4  The patient's score is based upon: CHF History: No HTN History: Yes Diabetes History: No Stroke History: No Vascular Disease History: Yes Age Score: 2 Gender Score: 0     Clinical ASCVD: Yes  The ASCVD Risk score Mikey Bussing DC Jr., et al., 2013) failed to calculate for the following reasons:   The 2013 ASCVD risk score is only valid for ages 30 to 41   The patient has a prior MI or stroke diagnosis    Depression screen [facility] 2/9 06/11/2020 11/23/2018 11/22/2017  Decreased Interest 0 0 0  Down, Depressed, Hopeless 0 0 1  PHQ - 2 Score 0 0 1  Altered sleeping - - 1  Tired, decreased energy - - 0  Change in appetite - - 0  Feeling bad or failure about yourself  - - 1  Trouble concentrating - - 0  Moving slowly or fidgety/restless - - 0  Suicidal thoughts - - 0  PHQ-9 Score - - 3  Difficult doing work/chores - - Not difficult at all  Some recent data might be hidden      Social History   Tobacco Use  Smoking Status Former Smoker   Years: 10.00   Types: Cigarettes   Quit date: 08/09/1968   Years since quitting: 52.6  Smokeless Tobacco Never Used   BP Readings from Last 3 Encounters:  03/02/21 102/62  02/15/21 128/73  10/16/20 132/78   Pulse Readings from Last 3 Encounters:  03/02/21  86  02/15/21 70  10/16/20 71   Wt Readings from Last 3 Encounters:  03/02/21 193 lb (87.5 kg)  10/16/20 186 lb 12.8 oz (84.7 kg)  09/23/20 183 lb (83 kg)   BMI Readings from Last 3 Encounters:  03/02/21 26.18 kg/m  10/16/20 25.33 kg/m  09/23/20 24.82 kg/m    Assessment/Interventions: Review of patient past medical history, allergies, medications, health status, including review of consultants reports, laboratory and other test data, was performed as part of comprehensive evaluation and provision of chronic care management services.   SDOH:  (Social Determinants of Health) assessments and interventions performed: Yes   SDOH Screenings   Alcohol Screen: Not on file  Depression (PHQ2-9): Low Risk    PHQ-2 Score: 0  Financial  Resource Strain: Low Risk    Difficulty of Paying Living Expenses: Not hard at all  Food Insecurity: No Food Insecurity   Worried About Charity fundraiser in the Last Year: Never true   Ran Out of Food in the Last Year: Never true  Housing: Low Risk    Last Housing Risk Score: 0  Physical Activity: Sufficiently Active   Days of Exercise per Week: 7 days   Minutes of Exercise per Session: 30 min  Social Connections: Engineer, building services of Communication with Friends and Family: More than three times a week   Frequency of Social Gatherings with Friends and Family: Once a week   Attends Religious Services: More than 4 times per year   Active Member of [employer] or Organizations: Yes   Attends Music therapist: More than 4 times per year   Marital Status: Married  Stress: No Stress Concern Present   Feeling of Stress : Not at all  Tobacco Use: Medium Risk   Smoking Tobacco Use: Former Smoker   Smokeless Tobacco Use: Never Used  Transportation Needs: No Data processing manager (Medical): No   Lack of Transportation (Non-Medical): No    CCM Care Plan  Allergies  Allergen Reactions   Methotrexate Other  (See Comments)    REACTION: tachycardia   Aricept [Donepezil Hcl]     nightmares    Crestor [Rosuvastatin Calcium]     Myalgias with 38m dose   Lisinopril Other (See Comments)    cough   Namenda [Memantine]     n/d   Atorvastatin Other (See Comments)    Muscle pain, leg cramps   Pravastatin Sodium Other (See Comments)    REACTION: cramps, fatigue    Medications Reviewed Today     Reviewed by FCharlton Haws R[facility](Pharmacist) on 03/29/21 at 1[place]linList Status: <None>   Medication Order Taking? Sig Documenting Provider Last Dose Status Informant  acetaminophen (TYLENOL) 500 MG tablet [id number] Take 1,000 mg by mouth every 6 (six) hours as needed. [provider] Taking Active Self  acidophilus (RISAQUAD) CAPS capsule [id number] Take 1 capsule by mouth daily. [provider] Taking Active   aspirin EC 81 MG tablet [id number] Take 81 mg by mouth daily. [provider] Taking Active Self  donepezil (ARICEPT) 10 MG tablet [id number] Take 1/2 tablet daily ACameron Sprang MD Taking Active   ELIQUIS 5 MG TABS tablet [id number] TAKE ONE TABLET TWICE DAILY TSueanne Margarita MD Taking Active   escitalopram (LEXAPRO) 20 MG tablet [id number] Take 1 tablet daily Plotnikov, AEvie Lacks MD Taking Active   ezetimibe (ZETIA) 10 MG tablet [id number] TAKE 1/2 TABLET DAILY Plotnikov, AEvie Lacks MD Taking Active   lisinopril (ZESTRIL) 10 MG tablet [id number] TAKE ONE TABLET DAILY Turner, TEber Hong MD Taking Active   metoprolol tartrate (LOPRESSOR) 25 MG tablet [id number] TAKE ONE TABLET TWICE DAILY Turner, TEber Hong MD Taking Active   nitroGLYCERIN (NITROSTAT) 0.4 MG SL tablet [id number] ONE TABLET UNDER TONGUE AS NEEDED FOR CHEST PAIN. MAY REPEAT IN 5 MINUTES AND AGAIN IN 5 MINUTES (TOTAL OF 3 TABLETS) Allred, JJeneen Rinks MD Taking Active Self  omeprazole (PRILOSEC) 40 MG capsule [id number] TAKE ONE CAPSULE EACH DAY Plotnikov, AEvie Lacks MD  Taking Active   oxyCODONE-acetaminophen (PERCOCET/ROXICET) 5-325 MG tablet [id number] TAKE ONE TABLET EVERY EIGHT HOURS AS NEEDED FOR SEVERE PAIN Plotnikov, AEvie Lacks  MD Taking Active   Propylene Glycol (SYSTANE BALANCE OP) [id number] Yes Place 2 drops into both eyes daily as needed (dry eyes).  [provider] Taking Active Self  REPATHA [place] 224 MG/ML Darden Palmer [id number] Yes INJECT 1 PEN INTO SKIN EVERY 52 DAYS Turner, Eber Hong, MD Taking Active   rosuvastatin (CRESTOR) 5 MG tablet [id number] Yes TAKE ONE TABLET DAILY Sueanne Margarita, MD Taking Active   zolpidem (AMBIEN) 10 MG tablet [id number] Yes TAKE 1/2 TO 1 TABLET AT BEDTIME AS NEEDED FOR SLEEP Plotnikov, Evie Lacks, MD Taking Active             Patient Active Problem List   Diagnosis Date Noted   Atherosclerosis of aorta ([place]) 03/02/2021   COVID-19 virus infection 11/15/2020   Weight loss 09/29/2020   Near syncope 09/04/2020   Chest pain    Dyspepsia    Encounter for medication management 11/14/2019   Angular stomatitis 07/09/2019   Cervical radiculopathy 06/05/2019   Pain in joint of left shoulder 05/31/2019   Concussion 04/17/2019   Hemoptysis 04/05/2019   Confusion 04/05/2019   Memory loss 12/21/2018   Nausea & vomiting 02/07/2018   Chills 02/07/2018   Rash 02/07/2018   Insomnia 09/22/2017   Cervical spondylolysis 06/28/2017   Hyperglycemia 06/22/2017   Well adult exam 11/18/2016   Hydronephrosis 11/18/2016   UPJ obstruction, 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   Allergic rhinitis 06/02/2016   Asthma with exacerbation 06/02/2016   Sinusitis, chronic 10/17/2015   OSA (obstructive sleep apnea) 06/19/2015   Cellulitis and abscess of leg, except foot 05/26/2015   Paroxysmal atrial fibrillation ([place]) 02/21/2015   CAP (community acquired pneumonia) 01/02/2015   Upper respiratory infection 12/02/2014   Vivid dream 09/25/2014   Actinic keratoses 04/26/2014   CAD (coronary artery disease) 02/25/2014   STEMI (ST elevation  myocardial infarction) ([place]) 01/08/2014   Rheumatoid bursitis of left elbow ([place]) 08/08/2013   Situational depression 06/18/2013   Neck pain 04/23/2012   Acute abdominal pain in left flank 02/14/2012   Hypertension 09/23/2011   Chest pain, midsternal 09/13/2011   Mixed hyperlipidemia 07/23/2010   Palpitations 07/07/2010   Rheumatoid arthritis ([place]) 06/04/2010   PARESTHESIA 06/04/2010   Pain in joint 11/27/2009   MYALGIA 08/21/2009   ALLERGIC RHINITIS 06/11/2008   OSTEOARTHRITIS 06/11/2008   FATIGUE 06/11/2008   LOW BACK PAIN 07/27/2007   COLONIC POLYPS, HX OF 07/27/2007    Immunization History  Administered Date(s) Administered   Fluad Quad(high Dose 65+) 07/09/2019, 08/15/2020   Influenza Split 08/10/2011, 08/15/2012   Influenza Whole 08/21/2009, 09/22/2010   Influenza, High Dose Seasonal PF 09/16/2015, 08/18/2016, 08/31/2017, 08/14/2018   Influenza,inj,Quad PF,6+ Mos 08/06/2013   Influenza-Unspecified 08/31/2017   PFIZER(Purple Top)SARS-COV-2 Vaccination 12/11/2019, 01/01/2020, 08/12/2020   Pneumococcal Conjugate-13 10/02/2013   Pneumococcal Polysaccharide-23 06/04/2010   Tdap 10/22/2011   Zoster Recombinat (Shingrix) 03/09/2017, 05/10/2017    Conditions to be addressed/monitored:  Hypertension, Hyperlipidemia, Atrial Fibrillation, GERD and Depression, Dementia  Care Plan : CCM Pharmacy Care Plan  Updates made by Charlton Haws, [place] since 03/29/2021 12:00 AM     Problem: Hypertension, Hyperlipidemia, Atrial Fibrillation, GERD and Depression, Dementia   Priority: High     Long-Range Goal: Disease management   Start Date: 03/29/2021  Expected End Date: 09/29/2021  This Visit's Progress: On track  Priority: High  Note:   Current Barriers:  Unable to independently monitor therapeutic efficacy Unable to self administer medications as prescribed  Pharmacist Clinical Goal(s):  Patient will achieve adherence to monitoring guidelines  and medication adherence to  achieve therapeutic efficacy achieve ability to self administer medications as prescribed through use of pill packs as evidenced by patient report through collaboration with PharmD and provider.   Interventions: 1:1 collaboration with Plotnikov, Evie Lacks, MD regarding development and update of comprehensive plan of care as evidenced by provider attestation and co-signature Inter-disciplinary care team collaboration (see longitudinal plan of care) Comprehensive medication review performed; medication list updated in electronic medical record  Hypertension (BP goal <140/90) -Controlled - of note patient is taking less metoprolol than prescribed, BP has been borderline low w/ orthostasis issues -Current treatment: Lisinopril 10 mg daily Metoprolol tartrate 25 mg BID - takes 1 HS -Denies hypotensive/hypertensive symptoms -Educated on BP goals and benefits of medications for prevention of heart attack, stroke and kidney damage; Symptoms of hypotension and importance of maintaining adequate hydration; -Counseled to monitor BP at home daily, document, and provide log at future appointments -Recommended to continue current medication (may consider reducing lisinopril if orthostasis issues persist)  Hyperlipidemia / CAD: (LDL goal < 70) -Not ideally controlled - LDL is above goal; pt's wife reports he is not consistent with Repatha injections due to high cost of medication -hx STEMI 12/2013 -Current treatment: Rosuvastatin 5 mg daily Ezetimibe 10 mg daily Repatha 140 mg q 14 days Nitroglycerin 0.4 mg SL prn Aspirin 81 mg daily -Educated on Cholesterol goals;  Benefits of statin/ezetimibe/Repatha combo for ASCVD risk reduction; -Recommended to continue current medication  -Assessed patient finances - pt qualifies for [employer], funding obtained for Repatha  ID: [id number]  BIN: [id number]  PCN: [id number]  GRP: [id number]  Atrial Fibrillation (Goal: prevent stroke and major  bleeding) -Controlled -CHADSVASC: 4 -Current treatment: Rate control: Metoprolol tartrate 25 mg BID - takes 1 HS Anticoagulation: Eliquis 5 mg BID -Counseled on increased risk of stroke due to Afib and benefits of anticoagulation for stroke prevention; bleeding risk associated with Eliquis and importance of self-monitoring for signs/symptoms of bleeding; -Recommended to continue current medication  Depression/Anxiety/Insomnia (Goal: manage symptoms) -Controlled - pt's wife reports she sees a difference in mood with escitalopram -Current treatment: Escitalopram 20 mg daily Zolpidem 10 mg daily PRN -PHQ9: 0 (05/2020) -GAD7: not on file -Connected with PCP for mental health support -Educated on Benefits of medication for symptom control -Recommended to continue current medication  Dementia (Goal: slow progression) -Controlled - pt's wife is not sure medication is helping -Current treatment  Donepezil 10 mg - 1/2 tab daily -Counseled on medication benefits - delay progression of disease, not improvement -Recommended to continue current medication  GERD (Goal: manage symptoms) -Controlled -Current treatment  Omeprazole 40 mg daily Probiotic (Risaquad) -Recommended to continue current medication  Rheumatoid arthritis (Goal: manage symptoms) -Controlled - pt wife reports prednisone Rx from rheumatologist is PRN for flares; pt is not currently taking any -Current treatment  Prednisone 5 mg daily PRN -Recommended to continue current medication PRN  Health Maintenance -Vaccine gaps: none -Current therapy:  Tylenol 500 mg PRN Systane eye drops -Patient is satisfied with current therapy and denies issues -Recommended to continue current medication  Patient Goals/Self-Care Activities Patient will:  - take medications as prescribed focus on medication adherence by pill packs check blood pressure daily, document, and provide at future appointments collaborate with provider on  medication access solutions  Increase fluids to prevent orthostasis  Follow Up Plan: Telephone follow up appointment with care management team member scheduled for: 3 months      Medication Assistance:  Repatha - obtained [employer]  grant. See care plan for details.  Patient's preferred pharmacy is:  [place], [place] - [address] [place] [address] Phone: [phone] Fax: [phone]  [place], [place], [address] [place], [address] [place] [address] Phone: [phone] Fax: [phone]  [id number] [place], [address]Phone: [phone]: [phone] Uses pill box? Yes Pt endorses 100% compliance  We discussed: Verbal consent obtained for [facility] enhanced pharmacy services (medication synchronization, adherence packaging, delivery coordination). A medication sync plan was created to allow patient to get all medications delivered once every 30 to 90 days per patient preference. Patient understands they have freedom to choose pharmacy and clinical pharmacist will coordinate care between all prescribers and [facility].  Patient decided to: Utilize [facility] for medication synchronization, packaging and delivery  Care Plan and Follow Up Patient Decision:  Patient agrees to Care Plan and Follow-up.  Plan: Telephone follow up appointment with care management team member scheduled for:  3 months  LCharlene Brooke PharmD, BPara March CPP Clinical Pharmacist [phone]  Medical screening examination/treatment/procedure(s) were performed by non-physician practitioner and as supervising physician I was immediately available for consultation/collaboration.  I agree with above. ALew Dawes MD

## 2021-03-29 NOTE — Patient Instructions (Addendum)
Visit Information  Phone number for Pharmacist: [phone]  Thank you for meeting with me to discuss your medications! I look forward to working with you to achieve your health care goals. Below is a summary of what we talked about during the visit:  Goals Addressed            This Visit's Progress   . Manage My Medicine       Timeframe:  Long-Range Goal Priority:  High Start Date:     03/26/21                        Expected End Date:      10/14/21                 Follow Up Date 07/14/21    - call for medicine refill 2 or 3 days before it runs out - call if I am sick and can't take my medicine - keep a list of all the medicines I take; vitamins and herbals too -Utilize [facility] for medication synchronization, packaging and delivery    Why is this important?   . These steps will help you keep on track with your medicines.   Notes:        Mr. Gillson was given information about Chronic Care Management services today including:  1. CCM service includes personalized support from designated clinical staff supervised by his physician, including individualized plan of care and coordination with other care providers 2. 24/7 contact phone numbers for assistance for urgent and routine care needs. 3. Standard insurance, coinsurance, copays and deductibles apply for chronic care management only during months in which we provide at least 20 minutes of these services. Most insurances cover these services at 100%, however patients may be responsible for any copay, coinsurance and/or deductible if applicable. This service may help you avoid the need for more expensive face-to-face services. 4. Only one practitioner may furnish and bill the service in a calendar month. 5. The patient may stop CCM services at any time (effective at the end of the month) by phone call to the office staff.  Patient agreed to services and verbal consent obtained.   Patient verbalizes understanding of  instructions provided today and agrees to view in [place].  Telephone follow up appointment with pharmacy team member scheduled for: 3 months  Charlene Brooke, PharmD, BCACP, CPP Clinical Pharmacist [facility] [phone]  Orthostatic Hypotension Blood pressure is a measurement of how strongly, or weakly, your blood is pressing against the walls of your arteries. Orthostatic hypotension is a sudden drop in blood pressure that happens when you quickly change positions, such as when you get up from sitting or lying down. Arteries are blood vessels that carry blood from your heart throughout your body. When blood pressure is too low, you may not get enough blood to your brain or to the rest of your organs. This can cause weakness, light-headedness, rapid heartbeat, and fainting. This can last for just a few seconds or for up to a few minutes. Orthostatic hypotension is usually not a serious problem. However, if it happens frequently or gets worse, it may be a sign of something more serious. What are the causes? This condition may be caused by:  Sudden changes in posture, such as standing up quickly after you have been sitting or lying down.  Blood loss.  Loss of body fluids (dehydration).  Heart problems.  Hormone (endocrine) problems.  Pregnancy.  Severe infection.  Lack of certain nutrients.  Severe allergic reactions (anaphylaxis).  Certain medicines, such as blood pressure medicine or medicines that make the body lose excess fluids (diuretics). Sometimes, this condition can be caused by not taking medicine as directed, such as taking too much of a certain medicine. What increases the risk? The following factors may make you more likely to develop this condition:  Age. Risk increases as you get older.  Conditions that affect the heart or the central nervous system.  Taking certain medicines, such as blood pressure medicine or diuretics.  Being  pregnant. What are the signs or symptoms? Symptoms of this condition may include:  Weakness.  Light-headedness.  Dizziness.  Blurred vision.  Fatigue.  Rapid heartbeat.  Fainting, in severe cases. How is this diagnosed? This condition is diagnosed based on:  Your medical history.  Your symptoms.  Your blood pressure measurement. Your health care provider will check your blood pressure when you are: ? Lying down. ? Sitting. ? Standing. A blood pressure reading is recorded as two numbers, such as "120 over 80" (or 120/80). The first ("top") number is called the systolic pressure. It is a measure of the pressure in your arteries as your heart beats. The second ("bottom") number is called the diastolic pressure. It is a measure of the pressure in your arteries when your heart relaxes between beats. Blood pressure is measured in a unit called mm Hg. Healthy blood pressure for most adults is 120/80. If your blood pressure is below 90/60, you may be diagnosed with hypotension. Other information or tests that may be used to diagnose orthostatic hypotension include:  Your other vital signs, such as your heart rate and temperature.  Blood tests.  Tilt table test. For this test, you will be safely secured to a table that moves you from a lying position to an upright position. Your heart rhythm and blood pressure will be monitored during the test. How is this treated? This condition may be treated by:  Changing your diet. This may involve eating more salt (sodium) or drinking more water.  Taking medicines to raise your blood pressure.  Changing the dosage of certain medicines you are taking that might be lowering your blood pressure.  Wearing compression stockings. These stockings help to prevent blood clots and reduce swelling in your legs. In some cases, you may need to go to the hospital for:  Fluid replacement. This means you will receive fluids through an IV.  Blood  replacement. This means you will receive donated blood through an IV (transfusion).  Treating an infection or heart problems, if this applies.  Monitoring. You may need to be monitored while medicines that you are taking wear off. Follow these instructions at home: Eating and drinking  Drink enough fluid to keep your urine pale yellow.  Eat a healthy diet, and follow instructions from your health care provider about eating or drinking restrictions. A healthy diet includes: ? Fresh fruits and vegetables. ? Whole grains. ? Lean meats. ? Low-fat dairy products.  Eat extra salt only as directed. Do not add extra salt to your diet unless your health care provider told you to do that.  Eat frequent, small meals.  Avoid standing up suddenly after eating.   Medicines  Take over-the-counter and prescription medicines only as told by your health care provider. ? Follow instructions from your health care provider about changing the dosage of your current medicines, if this applies. ? Do not stop  or adjust any of your medicines on your own. General instructions  Wear compression stockings as told by your health care provider.  Get up slowly from lying down or sitting positions. This gives your blood pressure a chance to adjust.  Avoid hot showers and excessive heat as directed by your health care provider.  Return to your normal activities as told by your health care provider. Ask your health care provider what activities are safe for you.  Do not use any products that contain nicotine or tobacco, such as cigarettes, e-cigarettes, and chewing tobacco. If you need help quitting, ask your health care provider.  Keep all follow-up visits as told by your health care provider. This is important.   Contact a health care provider if you:  Vomit.  Have diarrhea.  Have a fever for more than 2-3 days.  Feel more thirsty than usual.  Feel weak and tired. Get help right away if you:  Have  chest pain.  Have a fast or irregular heartbeat.  Develop numbness in any part of your body.  Cannot move your arms or your legs.  Have trouble speaking.  Become sweaty or feel light-headed.  Faint.  Feel short of breath.  Have trouble staying awake.  Feel confused. Summary  Orthostatic hypotension is a sudden drop in blood pressure that happens when you quickly change positions.  Orthostatic hypotension is usually not a serious problem.  It is diagnosed by having your blood pressure taken lying down, sitting, and then standing.  It may be treated by changing your diet or adjusting your medicines. This information is not intended to replace advice given to you by your health care provider. Make sure you discuss any questions you have with your health care provider. Document Revised: 04/27/2018 Document Reviewed: 04/27/2018 Elsevier Patient Education  [place].

## 2021-03-30 ENCOUNTER — Other Ambulatory Visit: Payer: Self-pay | Admitting: *Deleted

## 2021-03-30 ENCOUNTER — Telehealth: Payer: Self-pay | Admitting: Cardiology

## 2021-03-30 ENCOUNTER — Other Ambulatory Visit: Payer: Self-pay

## 2021-03-30 MED ORDER — ELIQUIS 5 MG PO TABS: 1.0000 | ORAL_TABLET | Freq: Two times a day (BID) | ORAL | 1 refills | Status: DC

## 2021-03-30 MED ORDER — METOPROLOL TARTRATE 25 MG PO TABS: 25.0000 mg | ORAL_TABLET | Freq: Two times a day (BID) | ORAL | 0 refills | Status: DC

## 2021-03-30 MED ORDER — ROSUVASTATIN CALCIUM 5 MG PO TABS
5.0000 mg | ORAL_TABLET | Freq: Every day | ORAL | 1 refills | Status: DC
Start: 2021-03-30 — End: 2021-11-25

## 2021-03-30 MED ORDER — REPATHA SURECLICK 140 MG/ML ~~LOC~~ SOAJ: SUBCUTANEOUS | 11 refills | Status: DC

## 2021-03-30 MED ORDER — LISINOPRIL 10 MG PO TABS: 1.0000 | ORAL_TABLET | Freq: Every day | ORAL | 1 refills | Status: DC

## 2021-03-30 NOTE — Addendum Note (Signed)
Addended by: Carter Kitten D on: 03/30/2021 03:30 PM   Modules accepted: Orders

## 2021-03-30 NOTE — Telephone Encounter (Signed)
Pt's medication was sent to pt's pharmacy as requested. Confirmation received.  °

## 2021-03-30 NOTE — Telephone Encounter (Signed)
Pt's age 87, wt 87.5 kg, SCr 0.82, CrCl 51.51, last ov w/ TT 09/19/20. Will refill Eliquis as requested and update Repatha rx that was sent in earlier today.

## 2021-03-30 NOTE — Progress Notes (Signed)
Called Dr. Theodosia Blender office to request refills for Lisinopril and Metoprolol for Mr. Mendonsa. Spoke with MOA Jamesetta Orleans who stated that the patient was overdue for an appointment but if he calls and set up appointment then they would be able to send in refills. Message was sent to the refill team. Call the patient and spoke to his wife who said that she will call the office and schedule appointment.  Wendy Poet, CPA [facility]  Time spent:21

## 2021-03-30 NOTE — Telephone Encounter (Signed)
Pt needed medications sent to a different pharmacy, [facility]. Please address

## 2021-03-30 NOTE — Telephone Encounter (Signed)
*  STAT* If patient is at the pharmacy, call can be transferred to refill team.   1. Which medications need to be refilled? (please list name of each medication and dose if known)  lisinopril (ZESTRIL) 10 MG tablet metoprolol tartrate (LOPRESSOR) 25 MG tablet  2. Which pharmacy/location (including street and city if local pharmacy) is medication to be sent to? [facility] - [place], [place] - [place] [address]  3. Do they need a 30 day or 90 day supply?  90 day supply

## 2021-03-30 NOTE — Telephone Encounter (Signed)
Rec'd fax stating pt is concerting to our pharmacy. Requesting new rx's to be sent electronically for his Escitalopram, Zetia, Omeprazole and zolpidem. Sent maintenance meds pls advise on controls.Marland KitchenJohny Todd

## 2021-03-30 NOTE — Addendum Note (Signed)
Addended by: Dede Query R on: 03/30/2021 03:40 PM   Modules accepted: Orders

## 2021-04-02 ENCOUNTER — Other Ambulatory Visit: Payer: Self-pay | Admitting: Internal Medicine

## 2021-04-02 MED ORDER — ESCITALOPRAM OXALATE 20 MG PO TABS: ORAL_TABLET | ORAL | 3 refills | Status: DC

## 2021-04-02 MED ORDER — OMEPRAZOLE 40 MG PO CPDR: DELAYED_RELEASE_CAPSULE | ORAL | 3 refills | Status: DC

## 2021-04-02 MED ORDER — EZETIMIBE 10 MG PO TABS: 5.0000 mg | ORAL_TABLET | Freq: Every day | ORAL | 3 refills | Status: DC

## 2021-04-02 MED ORDER — ZOLPIDEM TARTRATE 10 MG PO TABS: ORAL_TABLET | ORAL | 1 refills | Status: DC

## 2021-04-02 NOTE — Progress Notes (Signed)
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  There is mild cognitive decline.  Patient is on donepezil 5 mg daily, memantine 10 mg twice daily was discontinued by PCP in October 2021 perhaps due to side effects.   Recommendations are as follows .  [name] Discussed the diagnosis of dementia, likely due to Alzheimer's disease. . Discussed safety both in and out of the home.  . Discussed the importance of regular daily schedule with inclusion of crossword puzzles to maintain brain function.  . Continue to monitor mood with Lexapro 20 mg daily . [name] Stay active exercising  at least 30 minutes at least 3 times a week.  Continue Donepezil 5 mg daily (had SE from higher dose) Side effects were discussed with the patient including nausea, vomiting, diarrhea, vivid dreams, and muscle cramps. . re-start Memantine 10 mg daily (half of the normal dose).  Memantine side effects include dizziness, headache, diarrhea and/or constipation.  However, in view of his cognitive decline, the benefits outweigh the risks.  This was discussed with the patient, and he is aware of the side effects, and agreed to at least try.  He knows to call us if any of these side effects are present.  If the symptoms return, will consider discontinuing it. . Follow up in 6  months.   Case discussed with Dr. Delice Lesch who agrees with the plan     Subjective:   [name] is a pleasant 87 year old right-handed man with a history of hypertension, hyperlipidemia, RA, OSA on CPAP, CAD, and most recently COVID virus in January 2022, was seen today in follow up for memory loss.  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  He is accompanied in the office by his wife, who supplements the history.  Previous records as well as any outside records available were reviewed prior to today's visit.  He is currently on donepezil 5 mg daily (he could not tolerate the full dose).  He was on memantine 10 mg twice daily in the past, which was discontinued by his PCP on a visit on 09/04/20 for weakness and fatigue, nausea and vomiting, PCP suspected that these symptoms were felt to be due to the medicine.  [place] mushroom extract was recommended by PCP but patient denies having tried it.    Today, the patient reports that "the memory is a little less than before".He continues to have memory storage problems, difficulties with visual object naming, and review semantic fluency (AD), as well as processing speed and executive function (vascular) as in previous chart report. Since last visit he moved to an independent living community, [facility] and is trying to adapt to the new lifestyle.  He is to try wood working class, and also he and his wife are to try a chair feet class III times a week.  He enjoys reading.  At times, he gets frustrated, but this is not new, and "if anything, it is less than before" moving to this community.      His wife administers the medications and manages finances.  He no longer drives.  Of note, he was seen at the ED on 4/22 for an episode of near syncope felt to be due to [facility]. He  was at [facility], going up to the altar for communion feeling dizzy, resolving by sitting down. Workup was essentially negative without evidence of ACS, electrolyte abnormalities or anemia.  At this time he denies any headaches, dizziness, focal numbness, tingling, weakness, no falls.  Sleep is good, but he needs to take as needed Ambien with melatonin for a good night. No sleep walking or vivid dreams reported. He walks without assistance.  He denies getting lost.  History on Initial Assessment 04/11/2019: This is an 87 year  old right-handed man with a history of hypertension, hyperlipidemia, OSA on CPAP, CAD, presenting for evaluation of worsening memory. He feels his memory is okay, he gets confused some, "my wife tells me I forget things." He makes jokes several times during the visit. His wife started noticing changes the beginning of the year. He started having personality changes where he would just explode for no reason. He was started on Lexapro which has significantly helped. She has noticed short term memory issues, and was missing bills that she had to take over in January. He was also starting to miss medications, his wife has started helping. Last night he was very concerned that he had not taken his medications. He has gotten lost driving, last week he could not remember what errand he went to do and was driving around. He could not remember where he would be going, but then figures it out. He repeatedly says he misplaced his hearing aids. His wife was driving yesterday and they brought her car to be serviced, after going to another place, he could not remember that they had brought her car in earlier. He had an unusual episode last week when he came upstairs and told his wife that they had to go and leave now, he was dressed in 2 pairs of boxer shorts and his shirt on backward (unusual, he is usually a fastidious dresser per wife), ready to go somewhere late at night. She found that he had pulled out his clothes and medications and wrapped his medications in his khaki pants. She directed him to bed and he went to sleep. He saw his PCP and had normal bloodwork and urinalysis. I personally reviewed head CT without contrast done 5/26 which did not show any acute changes, there was diffuse atrophy and chronic microvascular disease. His wife notes he is much more alert in the daytime and more confused at night. He was started on Donepezil 5mg  daily, which he is tolerating without side effects. There is no family history of  dementia, no history of significant head injuries. He usually has 1-2 alcohol drinks at night.   He denies any headaches, dizziness (except upon standing quickly), diplopia, dysarthria/dysphagia, neck/back pain, focal numbness/tingling/weakness, bowel dysfunction, anosmia. He has infrequent incontinence. He has had right hand shaking occasionally around 3-4 times a week. He has a history of sleepwalking. His wife has not noticed any staring/unresponsive episodes. No paranoia or hallucinations. He has trouble sleeping and takes 1/2 tablet of Ambien. He has fallen a couple of times, last fall was 3 weeks ago.    PREVIOUS MEDICATIONS:Memantine d/cd 08/2020  CURRENT MEDICATIONS:  Outpatient Encounter Medications as of 04/03/2021  Medication Sig  . acetaminophen (TYLENOL) 500 MG tablet Take 1,000 mg by mouth every 6 (six) hours as needed.  [name] acidophilus (RISAQUAD) CAPS capsule Take 1 capsule by mouth daily.  [name] apixaban (ELIQUIS) 5 MG TABS tablet Take 1 tablet (5 mg total) by mouth 2 (two) times daily.  [name] aspirin  EC 81 MG tablet Take 81 mg by mouth daily.  [name] donepezil (ARICEPT) 10 MG tablet Take 1/2 tablet daily  . escitalopram (LEXAPRO) 20 MG tablet Take 1 tablet daily  . Evolocumab (REPATHA SURECLICK) 160 MG/ML SOAJ INJECT 1 PEN INTO SKIN EVERY 14 DAYS  . ezetimibe (ZETIA) 10 MG tablet Take 0.5 tablets (5 mg total) by mouth daily.  [name] lisinopril (ZESTRIL) 10 MG tablet Take 1 tablet (10 mg total) by mouth daily.  . memantine (NAMENDA) 10 MG tablet Take 1 tablet (10 mg total) by mouth at bedtime.  . metoprolol tartrate (LOPRESSOR) 25 MG tablet Take 1 tablet (25 mg total) by mouth 2 (two) times daily. Please make overdue appt with DrAllred before anymore refills.Thank you1st attempt  . nitroGLYCERIN (NITROSTAT) 0.4 MG SL tablet ONE TABLET UNDER TONGUE AS NEEDED FOR CHEST PAIN. MAY REPEAT IN 5 MINUTES AND AGAIN IN 5 MINUTES (TOTAL OF 3 TABLETS)  . omeprazole (PRILOSEC) 40 MG capsule TAKE ONE CAPSULE EACH  DAY  . oxyCODONE-acetaminophen (PERCOCET/ROXICET) 5-325 MG tablet TAKE ONE TABLET EVERY EIGHT HOURS AS NEEDED FOR SEVERE PAIN  . Propylene Glycol (SYSTANE BALANCE OP) Place 2 drops into both eyes daily as needed (dry eyes).   . rosuvastatin (CRESTOR) 5 MG tablet Take 1 tablet (5 mg total) by mouth daily.  [name] zolpidem (AMBIEN) 10 MG tablet Take 1 by mouth at bedtime for sleep   No facility-administered encounter medications on file as of 04/03/2021.     Objective:     PHYSICAL EXAMINATION:    VITALS:   Vitals:   04/03/21 0833  BP: 137/81  Pulse: 64  Resp: 20  SpO2: 98%  Weight: 180 lb (81.6 kg)  Height: 6' (1.829 m)    GEN:  The patient appears stated age and is in NAD. HEENT:  Normocephalic, atraumatic.   Neurological examination:  General: NAD, well-groomed, appears stated age. Orientation: The patient is alert. Oriented to person, year is 2020, late Spring, Monday, June, ? Today's date Cranial nerves: There is good facial symmetry. The speech is fluent and clear. No aphasia or dysarthria. Fund of knowledge is appropriate. Recent and remote memory are impaired. Attention and concentration are impaired. Able to name objects and repeat phrases. Unable to perform delayed recall  Hearing is intact to conversational tone with heating aid.    Sensation: Sensation is intact to light touch throughout Motor: Strength is at least antigravity x4.  Montreal Cognitive Assessment  03/10/2020 04/16/2019  Visuospatial/ Executive (0/5) 1 3  Naming (0/3) 3 3  Attention: Read list of digits (0/2) 2 2  Attention: Read list of letters (0/1) 1 1  Attention: Serial 7 subtraction starting at 100 (0/3) 3 3  Language: Repeat phrase (0/2) 1 1  Language : Fluency (0/1) 1 1  Abstraction (0/2) 1 2  Delayed Recall (0/5) 0 2  Orientation (0/6) 3 6  Total 16 24  Adjusted Score (based on education) 16 -     MMSE - [place] Exam 04/03/2021 11/22/2017  Orientation to time 1 5  Orientation to  Place 4 5  Registration 3 3  Attention/ Calculation 4 5  Recall 0 1  Language- name 2 objects 2 2  Language- repeat 1 1  Language- follow 3 step command 3 3  Language- read & follow direction 1 1  Write a sentence 1 1  Copy design 1 1  Total score 21 28      Movement examination: Tone: There is normal tone in  the UE/LE Abnormal movements:  no tremor.  No myoclonus.  No asterixis.   Coordination:  There is no decremation with RAM's. Finger to nose normal  Gait and Station: The patient has no difficulty arising out of a deep-seated chair without the use of the hands. The patient's stride length is good.  Gait is cautious and narrow.    CBC CBC Latest Ref Rng & Units 02/15/2021 08/31/2020 03/14/2020  WBC 4.0 - 10.5 K/uL 6.7 7.6 8.6  Hemoglobin 13.0 - 17.0 g/dL 13.2 14.1 13.8  Hematocrit 39.0 - 52.0 % 39.9 43.4 42.3  Platelets 150 - 400 K/uL 304 302 299     CMP Latest Ref Rng & Units 02/15/2021 08/31/2020 03/14/2020  Glucose 70 - 99 mg/dL 93 123(H) 115(H)  BUN 8 - 23 mg/dL 15 10 15   Creatinine 0.61 - 1.24 mg/dL 0.82 0.92 0.93  Sodium 135 - 145 mmol/L 138 138 141  Potassium 3.5 - 5.1 mmol/L 3.9 4.0 3.7  Chloride 98 - 111 mmol/L 108 105 105  CO2 22 - 32 mmol/L 25 24 26   Calcium 8.9 - 10.3 mg/dL 9.2 9.3 9.4  Total Protein 6.5 - 8.1 g/dL 7.3 6.8 -  Total Bilirubin 0.3 - 1.2 mg/dL 0.8 1.0 -  Alkaline Phos 38 - 126 U/L 59 54 -  AST 15 - 41 U/L 21 22 -  ALT 0 - 44 U/L 13 14 -       Total time spent on today's visit was 60 minutes, including both face-to-face time and nonface-to-face time.  Time included that spent on review of records (prior notes available to me/labs/imaging if pertinent), discussing treatment and goals, answering patient's questions and coordinating care.  Cc:  Plotnikov, Evie Lacks, MD Sharene Butters, PA-C

## 2021-04-02 NOTE — Telephone Encounter (Signed)
Faxed zolpidem to [facility] manually.Marland KitchenJohny Chess

## 2021-04-03 ENCOUNTER — Ambulatory Visit (INDEPENDENT_AMBULATORY_CARE_PROVIDER_SITE_OTHER): Payer: Medicare Other | Admitting: Neurology

## 2021-04-03 ENCOUNTER — Encounter: Payer: Self-pay | Admitting: Neurology

## 2021-04-03 ENCOUNTER — Other Ambulatory Visit: Payer: Self-pay

## 2021-04-03 VITALS — BP 137/81 | HR 64 | Resp 20 | Ht 72.0 in | Wt 180.0 lb

## 2021-04-03 DIAGNOSIS — F0150 Vascular dementia without behavioral disturbance: Secondary | ICD-10-CM | POA: Diagnosis not present

## 2021-04-03 DIAGNOSIS — I2510 Atherosclerotic heart disease of native coronary artery without angina pectoris: Secondary | ICD-10-CM | POA: Diagnosis not present

## 2021-04-03 MED ORDER — MEMANTINE HCL 10 MG PO TABS: 10.0000 mg | ORAL_TABLET | Freq: Every day | ORAL | 11 refills | Status: DC

## 2021-04-03 MED ORDER — MEMANTINE HCL 10 MG PO TABS: 10.0000 mg | ORAL_TABLET | Freq: Every day | ORAL | 3 refills | Status: DC

## 2021-04-03 MED ORDER — DONEPEZIL HCL 10 MG PO TABS: ORAL_TABLET | ORAL | 3 refills | Status: DC

## 2021-04-03 NOTE — Progress Notes (Signed)
Patient was seen, evaluated, and treatment plan was discussed with the Advanced Practice Provider. MMSE today 21/30. He and his wife did not remember why Memantine was stopped in November 2021, it appears he reported fatigue, drowsiness, appetite changes. His wife has not really noticed any changes with these symptoms. They have moved to [place]. His wife looks forward to using [facility], his medications have been confusing for both of them. He is not driving, which his wife reports makes him very angry at her. Discussed [place] driving laws with dementia. They are agreeable to restarting Memantine 10mg  qhs, monitor for side effects. Continue Donepezil 5mg  daily. I have also reviewed the orders written for this patient which were under my direction. I agree with the findings and the plan of care as documented by the Advanced Practice Provider.

## 2021-04-03 NOTE — Patient Instructions (Addendum)
It was a pleasure to see you today at our office.   Recommendations:  Meds:Continue taking  donepezil 5 mg daily            Memantine is a memory medicine for the treatment of memory loss  Start Namenda (memantine) 10mg  tablets at night.  Side effects may  include constipation, dizziness, headache, somnolence, call us if any of these symptoms are present   Follow up in 6  months    RECOMMENDATIONS FOR ALL PATIENTS WITH MEMORY PROBLEMS: 1. Continue to exercise (Recommend 30 minutes of walking everyday, or 3 hours every week) 2. Increase social interactions - continue going to [place] and enjoy social gatherings with friends and family 3. Eat healthy, avoid fried foods and eat more fruits and vegetables 4. Maintain adequate blood pressure, blood sugar, and blood cholesterol level. Reducing the risk of stroke and cardiovascular disease also helps promoting better memory. 5. Avoid stressful situations. Live a simple life and avoid aggravations. Organize your time and prepare for the next day in anticipation. 6. Sleep well, avoid any interruptions of sleep and avoid any distractions in the bedroom that may interfere with adequate sleep quality 7. Avoid sugar, avoid sweets as there is a strong link between excessive sugar intake, diabetes, and cognitive impairment We discussed the Mediterranean diet, which has been shown to help patients reduce the risk of progressive memory disorders and reduces cardiovascular risk. This includes eating fish, eat fruits and green leafy vegetables, nuts like almonds and hazelnuts, walnuts, and also use olive oil. Avoid fast foods and fried foods as much as possible. Avoid sweets and sugar as sugar use has been linked to worsening of memory function.  There is always a concern of gradual progression of memory problems. If this is the case, then we may need to adjust level of care according to patient needs. Support, both to the patient and caregiver, should then be put  into place.      FALL PRECAUTIONS: Be cautious when walking. Scan the area for obstacles that may increase the risk of trips and falls. When getting up in the mornings, sit up at the edge of the bed for a few minutes before getting out of bed. Consider elevating the bed at the head end to avoid drop of blood pressure when getting up. Walk always in a well-lit room (use night lights in the walls). Avoid area rugs or power cords from appliances in the middle of the walkways. Use a walker or a cane if necessary and consider physical therapy for balance exercise. Get your eyesight checked regularly.    HOME SAFETY: Consider the safety of the kitchen when operating appliances like stoves, microwave oven, and blender. Consider having supervision and share cooking responsibilities until no longer able to participate in those. Accidents with firearms and other hazards in the house should be identified and addressed as well.   ABILITY TO BE LEFT ALONE: If patient is unable to contact 911 operator, consider using LifeLine, or when the need is there, arrange for someone to stay with patients. Smoking is a fire hazard, consider supervision or cessation. Risk of wandering should be assessed by caregiver and if detected at any point, supervision and safe proof recommendations should be instituted.  MEDICATION SUPERVISION: Inability to self-administer medication needs to be constantly addressed. Implement a mechanism to ensure safe administration of the medications.    Mediterranean Diet A Mediterranean diet refers to food and lifestyle choices that are based on the traditions of  countries located on the [employer]. This way of eating has been shown to help prevent certain conditions and improve outcomes for people who have chronic diseases, like kidney disease and heart disease. What are tips for following this plan? Lifestyle   Cook and eat meals together with your family, when possible.  Drink  enough fluid to keep your urine clear or pale yellow.  Be physically active every day. This includes:  Aerobic exercise like running or swimming.  Leisure activities like gardening, walking, or housework.  Get 7-8 hours of sleep each night.  If recommended by your health care provider, drink red wine in moderation. This means 1 glass a day for nonpregnant women and 2 glasses a day for men. A glass of wine equals 5 oz (150 mL). Reading food labels   Check the serving size of packaged foods. For foods such as rice and pasta, the serving size refers to the amount of cooked product, not dry.  Check the total fat in packaged foods. Avoid foods that have saturated fat or trans fats.  Check the ingredients list for added sugars, such as corn syrup. Shopping   At the grocery store, buy most of your food from the areas near the walls of the store. This includes:  Fresh fruits and vegetables (produce).  Grains, beans, nuts, and seeds. Some of these may be available in unpackaged forms or large amounts (in bulk).  Fresh seafood.  Poultry and eggs.  Low-fat dairy products.  Buy whole ingredients instead of prepackaged foods.  Buy fresh fruits and vegetables in-season from local farmers markets.  Buy frozen fruits and vegetables in resealable bags.  If you do not have access to quality fresh seafood, buy precooked frozen shrimp or canned fish, such as tuna, salmon, or sardines.  Buy small amounts of raw or cooked vegetables, salads, or olives from the deli or salad bar at your store.  Stock your pantry so you always have certain foods on hand, such as olive oil, canned tuna, canned tomatoes, rice, pasta, and beans. Cooking   Cook foods with extra-virgin olive oil instead of using butter or other vegetable oils.  Have meat as a side dish, and have vegetables or grains as your main dish. This means having meat in small portions or adding small amounts of meat to foods like pasta or  stew.  Use beans or vegetables instead of meat in common dishes like chili or lasagna.  Experiment with different cooking methods. Try roasting or broiling vegetables instead of steaming or sauteing them.  Add frozen vegetables to soups, stews, pasta, or rice.  Add nuts or seeds for added healthy fat at each meal. You can add these to yogurt, salads, or vegetable dishes.  Marinate fish or vegetables using olive oil, lemon juice, garlic, and fresh herbs. Meal planning   Plan to eat 1 vegetarian meal one day each week. Try to work up to 2 vegetarian meals, if possible.  Eat seafood 2 or more times a week.  Have healthy snacks readily available, such as:  Vegetable sticks with hummus.  Greek yogurt.  Fruit and nut trail mix.  Eat balanced meals throughout the week. This includes:  Fruit: 2-3 servings a day  Vegetables: 4-5 servings a day  Low-fat dairy: 2 servings a day  Fish, poultry, or lean meat: 1 serving a day  Beans and legumes: 2 or more servings a week  Nuts and seeds: 1-2 servings a day  Whole grains: 6-8 servings a  day  Extra-virgin olive oil: 3-4 servings a day  Limit red meat and sweets to only a few servings a month What are my food choices?  Mediterranean diet  Recommended  Grains: Whole-grain pasta. Brown rice. Bulgar wheat. Polenta. Couscous. Whole-wheat bread. [place].  Vegetables: Artichokes. Beets. Broccoli. Cabbage. Carrots. Eggplant. Green beans. Chard. Kale. Spinach. Onions. Leeks. Peas. Squash. Tomatoes. Peppers. Radishes.  Fruits: Apples. Apricots. Avocado. Berries. Bananas. Cherries. Dates. Figs. Grapes. Lemons. Melon. Oranges. Peaches. Plums. Pomegranate.  Meats and other protein foods: Beans. Almonds. Sunflower seeds. Pine nuts. Peanuts. [place]. Salmon. Scallops. Shrimp. [place]. Tilapia. Clams. Oysters. Eggs.  Dairy: Low-fat milk. Cheese. Greek yogurt.  Beverages: Water. Red wine. Herbal tea.  Fats and oils: Extra virgin olive  oil. Avocado oil. Grape seed oil.  Sweets and desserts: [place] yogurt with honey. Baked apples. Poached pears. Trail mix.  Seasoning and other foods: Basil. Cilantro. Coriander. Cumin. Mint. Parsley. Sage. Rosemary. Tarragon. Garlic. Oregano. Thyme. Pepper. Balsalmic vinegar. Tahini. Hummus. Tomato sauce. Olives. Mushrooms.  Limit these  Grains: Prepackaged pasta or rice dishes. Prepackaged cereal with added sugar.  Vegetables: Deep fried potatoes (french fries).  Fruits: Fruit canned in syrup.  Meats and other protein foods: Beef. Pork. Lamb. Poultry with skin. Hot dogs. Berniece Salines.  Dairy: Ice cream. Sour cream. Whole milk.  Beverages: Juice. Sugar-sweetened soft drinks. Beer. Liquor and spirits.  Fats and oils: Butter. Canola oil. Vegetable oil. Beef fat (tallow). Lard.  Sweets and desserts: Cookies. Cakes. Pies. Candy.  Seasoning and other foods: Mayonnaise. Premade sauces and marinades.  The items listed may not be a complete list. Talk with your dietitian about what dietary choices are right for you. Summary  The Mediterranean diet includes both food and lifestyle choices.  Eat a variety of fresh fruits and vegetables, beans, nuts, seeds, and whole grains.  Limit the amount of red meat and sweets that you eat.  Talk with your health care provider about whether it is safe for you to drink red wine in moderation. This means 1 glass a day for nonpregnant women and 2 glasses a day for men. A glass of wine equals 5 oz (150 mL). This information is not intended to replace advice given to you by your health care provider. Make sure you discuss any questions you have with your health care provider. Document Released: 06/24/2016 Document Revised: 07/27/2016 Document Reviewed: 06/24/2016 Elsevier Interactive Patient Education  2017 [employer].

## 2021-04-08 ENCOUNTER — Ambulatory Visit (INDEPENDENT_AMBULATORY_CARE_PROVIDER_SITE_OTHER): Payer: Medicare Other | Admitting: Student

## 2021-04-08 ENCOUNTER — Encounter: Payer: Self-pay | Admitting: Student

## 2021-04-08 VITALS — BP 122/60 | HR 54 | Ht 72.0 in | Wt 180.8 lb

## 2021-04-08 DIAGNOSIS — I480 Paroxysmal atrial fibrillation: Secondary | ICD-10-CM | POA: Diagnosis not present

## 2021-04-08 DIAGNOSIS — G4733 Obstructive sleep apnea (adult) (pediatric): Secondary | ICD-10-CM

## 2021-04-08 DIAGNOSIS — Z9989 Dependence on other enabling machines and devices: Secondary | ICD-10-CM | POA: Diagnosis not present

## 2021-04-08 DIAGNOSIS — E785 Hyperlipidemia, unspecified: Secondary | ICD-10-CM

## 2021-04-08 DIAGNOSIS — I2510 Atherosclerotic heart disease of native coronary artery without angina pectoris: Secondary | ICD-10-CM | POA: Diagnosis not present

## 2021-04-08 DIAGNOSIS — I10 Essential (primary) hypertension: Secondary | ICD-10-CM

## 2021-04-08 NOTE — Progress Notes (Signed)
PCP:  Plotnikov, Evie Lacks, MD Primary Cardiologist: Fransico Him, MD Electrophysiologist: Thompson Grayer, MD   [name] is a 87 y.o. male with history of CAD (inferior STEMI 12/2013 with total RCA occlusion s/p DESx2, residual 60-70% mLAD), AFib, OSA w/CPAP, GERD, HTN, HLD (statin intolerant) on Repatha being followed by lipid clinic, RA seen today for Thompson Grayer, MD for routine electrophysiology followup.    Since last being seen in our clinic the patient reports doing well overall. He was seen in ED 02/2021 for dizziness while at church. He has not had any further episodes, at least that he remembers.  He denies falls. Has mild lightheadedness with rapid standing after prolonged sitting. He is cautious at night when getting up to urinate.  he denies chest pain, palpitations, dyspnea, PND, orthopnea, nausea, vomiting, dizziness, syncope, edema, weight gain, or early satiety.  Past Medical History:  Diagnosis Date  . CAD (coronary artery disease)    a. s/p inferior STEMI 01/08/2014; LHC 01/08/14: total RCA occlusion s/p 3.5x31mm Xience DES distal RCA and 3.5x28 mm DES mid RCA, 60-70% mid LAD stenosis, EF 55%.  . Complication of anesthesia    'long to wake up after back surgery " 09/2003  . Diverticulosis of colon   . GERD (gastroesophageal reflux disease)   . History of cellulitis    05-26-2015  LLE  . History of colon polyps    1998- benign/  2008 adenomatous   . History of kidney stones    2013  . History of squamous cell carcinoma excision    2013;  2015;  06-12-2015 right leg/  02/ and 05/ 2017  left ear and left leg  . Hydronephrosis, left   . Hyperlipidemia   . Hypertension   . Migraine with aura   . OSA on CPAP 06/19/2015   Moderate OSA with AHI 18/hr  per study 05-20-2015  . Osteoarthritis   . Paroxysmal atrial fibrillation ([place]) 4/16   chads2vasc score is at least 4  . Premature atrial contractions   . RA (rheumatoid arthritis) [facility])    rheumatologist-  dr Leigh Aurora  . Sinusitis, chronic 10/17/2015  . Wears glasses   . Wears hearing aid    bilateral   Past Surgical History:  Procedure Laterality Date  . BACK SURGERY    . CARDIOVASCULAR STRESS TEST  11/21/2015   Low risk nuclear study w/ a small diaphragmatic attenuation artifact, no ischemia/  normal LV function and wall motion , ef 63%  . CATARACT EXTRACTION W/ INTRAOCULAR LENS  IMPLANT, BILATERAL  2015  . COLONOSCOPY  last one 06-09-2011  . CORONARY ANGIOPLASTY    . CYSTOSCOPY W/ RETROGRADES Left 07/12/2016   Procedure: CYSTOSCOPY WITH RETROGRADE PYELOGRAM LEFT URETERAL STENT;  Surgeon: Irine Seal, MD;  Location: [facility];  Service: Urology;  Laterality: Left;  . CYSTOSCOPY WITH RETROGRADE PYELOGRAM, URETEROSCOPY AND STENT PLACEMENT Left 08/11/2016   Procedure: CYSTOSCOPY WITH RETROGRADE PYELOGRAM,  DIAGNOSTIC URETEROSCOPY , STENT EXCHANGE;  Surgeon: Alexis Frock, MD;  Location: [facility];  Service: Urology;  Laterality: Left;  . 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   severe fibromatosis palm and fingers  . LEFT HEART CATHETERIZATION WITH CORONARY ANGIOGRAM N/A 01/08/2014   Procedure: LEFT HEART CATHETERIZATION WITH CORONARY ANGIOGRAM;  Surgeon: Troy Sine, MD;  Location[facility] C[facility] LAB;  Service: Cardiovascular;  Laterality:N/A;  total/ subtotal RCA/  mLAD 22-63% w/ mid systolic bridging/  preserved global LVF, ef 55%  . MOHS SURGERY  x2  feb and may 2017   left ear /  left leg  ([facility])  . PERCUTANEOUS CORONARY STENT INTERVENTION (PCI-S)  01/08/2014   Procedure: PERCUTANEOUS CORONARY STENT INTERVENTION (PCI-S);  Surgeon: Troy Sine, MD;  Location: [facility] CATH LAB;  Service: Cardiovascular;;  DES to mid and distal RCA  . POSTERIOR LUMBAR FUSION  10/01/2003   and Laminectomy/ diskectomy  L4 -- S1  . ROBOT ASSISTED PYELOPLASTY Left 09/15/2016   Procedure: XI ROBOTIC ASSISTED PYELOPLASTY;  Surgeon: Alexis Frock, MD;  Location: [facility];  Service: Urology;  Laterality:  Left;  . TONSILLECTOMY    . TRANSTHORACIC ECHOCARDIOGRAM  02/23/2016   mild LVH,  ef 50-55%,  grade 1 diastolic dysfunction/  mild to moderate AV calcification w/ no stenosis or regurg./  trivial MR and TR/  mild PR    Current Outpatient Medications  Medication Sig Dispense Refill  . acetaminophen (TYLENOL) 500 MG tablet Take 1,000 mg by mouth every 6 (six) hours as needed.    [name] acidophilus (RISAQUAD) CAPS capsule Take 1 capsule by mouth daily.    [name] apixaban (ELIQUIS) 5 MG TABS tablet Take 1 tablet (5 mg total) by mouth 2 (two) times daily. 180 tablet 1  . aspirin EC 81 MG tablet Take 81 mg by mouth daily.    [name] donepezil (ARICEPT) 10 MG tablet Take 1/2 tablet daily 45 tablet 3  . escitalopram (LEXAPRO) 20 MG tablet Take 1 tablet daily 90 tablet 3  . Evolocumab (REPATHA SURECLICK) 983 MG/ML SOAJ INJECT 1 PEN INTO SKIN EVERY 14 DAYS 2 mL 11  . ezetimibe (ZETIA) 10 MG tablet Take 0.5 tablets (5 mg total) by mouth daily. 45 tablet 3  . lisinopril (ZESTRIL) 10 MG tablet Take 1 tablet (10 mg total) by mouth daily. 90 tablet 1  . memantine (NAMENDA) 10 MG tablet Take 1 tablet (10 mg total) by mouth at bedtime. 90 tablet 3  . metoprolol tartrate (LOPRESSOR) 25 MG tablet Take 1 tablet (25 mg total) by mouth 2 (two) times daily. Please make overdue appt with DrAllred before anymore refills.Thank you1st attempt 60 tablet 0  . nitroGLYCERIN (NITROSTAT) 0.4 MG SL tablet ONE TABLET UNDER TONGUE AS NEEDED FOR CHEST PAIN. MAY REPEAT IN 5 MINUTES AND AGAIN IN 5 MINUTES (TOTAL OF 3 TABLETS) 25 tablet 6  . omeprazole (PRILOSEC) 40 MG capsule TAKE ONE CAPSULE EACH DAY 90 capsule 3  . oxyCODONE-acetaminophen (PERCOCET/ROXICET) 5-325 MG tablet TAKE ONE TABLET EVERY EIGHT HOURS AS NEEDED FOR SEVERE PAIN 90 tablet 0  . Propylene Glycol (SYSTANE BALANCE OP) Place 2 drops into both eyes daily as needed (dry eyes).     . rosuvastatin (CRESTOR) 5 MG tablet Take 1 tablet (5 mg total) by mouth daily. 90 tablet 1  .  zolpidem (AMBIEN) 10 MG tablet Take 1 by mouth at bedtime for sleep 90 tablet 1   No current facility-administered medications for this visit.    Allergies  Allergen Reactions  . Methotrexate Other (See Comments)    REACTION: tachycardia  . Aricept [Donepezil Hcl]     nightmares   . Crestor [Rosuvastatin Calcium]     Myalgias with 20mg  dose  . Lisinopril Other (See Comments)    cough  . Namenda [Memantine]     n/d  . Atorvastatin Other (See Comments)    Muscle pain, leg cramps  . Pravastatin Sodium Other (See Comments)    REACTION: cramps, fatigue    Social History   Socioeconomic History  . Marital status:  Married    Spouse name: Not on file  . Number of children: 2  . Years of education: Not on file  . Highest education level: Not on file  Occupational History  . Occupation: Retired    Fish farm manager: RETIRED  Tobacco Use  . Smoking status: Former Smoker    Years: 10.00    Types: Cigarettes    Quit date: 08/09/1968    Years since quitting: 52.6  . Smokeless tobacco: Never Used  Vaping Use  . Vaping Use: Never used  Substance and Sexual Activity  . Alcohol use: Yes    Alcohol/week: 2.0 standard drinks    Types: 2 Shots of liquor per week    Comment: daily  . Drug use: No  . Sexual activity: Yes    Partners: Female  Other Topics Concern  . Not on file  Social History Narrative   1 Caffeine drink daily    Right handed       Lives with wife      Social Determinants of Health   Financial Resource Strain: Low Risk   . Difficulty of Paying Living Expenses: Not hard at all  Food Insecurity: No Food Insecurity  . Worried About Charity fundraiser in the Last Year: Never true  . Ran Out of Food in the Last Year: Never true  Transportation Needs: No Transportation Needs  . Lack of Transportation (Medical): No  . Lack of Transportation (Non-Medical): No  Physical Activity: Sufficiently Active  . Days of Exercise per Week: 7 days  . Minutes of Exercise per  Session: 30 min  Stress: No Stress Concern Present  . Feeling of Stress : Not at all  Social Connections: Socially Integrated  . Frequency of Communication with Friends and Family: More than three times a week  . Frequency of Social Gatherings with Friends and Family: Once a week  . Attends Religious Services: More than 4 times per year  . Active Member of Clubs or Organizations: Yes  . Attends Archivist Meetings: More than 4 times per year  . Marital Status: Married  Human resources officer Violence: Not At Risk  . Fear of Current or Ex-Partner: No  . Emotionally Abused: No  . Physically Abused: No  . Sexually Abused: No     Review of Systems: General: No chills, fever, night sweats or weight changes  Cardiovascular:  No chest pain, dyspnea on exertion, edema, orthopnea, palpitations, paroxysmal nocturnal dyspnea Dermatological: No rash, lesions or masses Respiratory: No cough, dyspnea Urologic: No hematuria, dysuria Abdominal: No nausea, vomiting, diarrhea, bright red blood per rectum, melena, or hematemesis Neurologic: No visual changes, weakness, changes in mental status All other systems reviewed and are otherwise negative except as noted above.  Physical Exam: There were no vitals filed for this visit.  GEN- The patient is well appearing, alert and oriented x 3 today.   HEENT: normocephalic, atraumatic; sclera clear, conjunctiva pink; hearing intact; oropharynx clear; neck supple, no JVP Lymph- no cervical lymphadenopathy Lungs- Clear to ausculation bilaterally, normal work of breathing.  No wheezes, rales, rhonchi Heart- Regular rate and rhythm, no murmurs, rubs or gallops, PMI not laterally displaced GI- soft, non-tender, non-distended, bowel sounds present, no hepatosplenomegaly Extremities- no clubbing, cyanosis, or edema; DP/PT/radial pulses 2+ bilaterally MS- no significant deformity or atrophy Skin- warm and dry, no rash or lesion Psych- euthymic mood, full  affect Neuro- strength and sensation are intact  EKG is not ordered. Personal review of EKG from 02/17/21 shows NSR  at 66 bpm, QRS 106 ms  Additional studies reviewed include: Previous EP office notes.  02/23/2016: TTE Study Conclusions  - Left ventricle: The cavity size was normal. Wall thickness was  increased in a pattern of mild LVH. Systolic function was normal.  The estimated ejection fraction was in the range of 50% to 55%.  Wall motion was normal; there were no regional wall motion  abnormalities. Doppler parameters are consistent with abnormal  left ventricular relaxation (grade 1 diastolic dysfunction).  - Aortic valve: Mildly calcified annulus. Moderately thickened,  moderately calcified leaflets.    11/21/2014: stress myoview Impression Exercise Capacity: Fair exercise capacity. BP Response: Hypertensive blood pressure response. Clinical Symptoms: No significant symptoms noted. ECG Impression: No significant ST segment change suggestive of ischemia. Comparison with Prior Nuclear Study: No previous nuclear study performed  Assessment and Plan:  1. Paroxysmal Afib Regular on exam today and NSR by EKG 02/15/21 Denies symptoms. Continue Eliquis as below.   2. Secondary Hypercoagulable State (ICD10:  D68.69) The patient is at high risk of stroke/thromboembolism with CHA2DS2VASC of at least 4 on appropriately dosed Eliquis by most recent labs.     Stressed importance of fall prevention. Recommended night light between the bed and the bathroom and removing any potential obstacles such as rugs.   3. CAD Denies anginal symptoms.  Continue ASA, BB, Repatha  4. HTN Stable on current doses of lisinopril and lopressor.   5. HLD Continue Repatha and crestor per Lipid Clinic. Appreciate management.   6. OSA Follows with Dr. Radford Pax Encouraged nightly CPAP use  RTC 6 months. Sooner with issues. Recent labs stable.   Shirley Friar, PA-C   04/08/21 9:26 AM

## 2021-04-08 NOTE — Patient Instructions (Signed)
Medication Instructions:  Your physician recommends that you continue on your current medications as directed. Please refer to the Current Medication list given to you today.  *If you need a refill on your cardiac medications before your next appointment, please call your pharmacy*   Lab Work: None If you have labs (blood work) drawn today and your tests are completely normal, you will receive your results only by: [name] MyChart Message (if you have MyChart) OR . A paper copy in the mail If you have any lab test that is abnormal or we need to change your treatment, we will call you to review the results.   Follow-Up: At [facility], you and your health needs are our priority.  As part of our continuing mission to provide you with exceptional heart care, we have created designated Provider Care Teams.  These Care Teams include your primary Cardiologist (physician) and Advanced Practice Providers (APPs -  Physician Assistants and Nurse Practitioners) who all work together to provide you with the care you need, when you need it.  Your next appointment:   6 month(s)  The format for your next appointment:   In Person  Provider:   You will see one of the following Advanced Practice Providers on your designated Care Team:    Chanetta Marshall, NP  Tommye Standard, PA-C  Legrand Como "Baxter" [place], [place]

## 2021-04-14 DIAGNOSIS — M0609 Rheumatoid arthritis without rheumatoid factor, multiple sites: Secondary | ICD-10-CM | POA: Diagnosis not present

## 2021-04-14 NOTE — Progress Notes (Signed)
    Chronic Care Management Pharmacy Assistant   Name: CHORD TAKAHASHI  MRN: [id number] DOB: 07/14/1933   Medications: Outpatient Encounter Medications as of 03/26/2021  Medication Sig  . acetaminophen (TYLENOL) 500 MG tablet Take 1,000 mg by mouth every 6 (six) hours as needed.  [name] acidophilus (RISAQUAD) CAPS capsule Take 1 capsule by mouth daily.  [name] aspirin EC 81 MG tablet Take 81 mg by mouth daily.  . nitroGLYCERIN (NITROSTAT) 0.4 MG SL tablet ONE TABLET UNDER TONGUE AS NEEDED FOR CHEST PAIN. MAY REPEAT IN 5 MINUTES AND AGAIN IN 5 MINUTES (TOTAL OF 3 TABLETS)  . oxyCODONE-acetaminophen (PERCOCET/ROXICET) 5-325 MG tablet TAKE ONE TABLET EVERY EIGHT HOURS AS NEEDED FOR SEVERE PAIN  . Propylene Glycol (SYSTANE BALANCE OP) Place 2 drops into both eyes daily as needed (dry eyes).   . [DISCONTINUED] azithromycin (ZITHROMAX Z-PAK) 250 MG tablet As directed (Patient not taking: Reported on 03/29/2021)  . [DISCONTINUED] donepezil (ARICEPT) 10 MG tablet Take 1/2 tablet daily  . [DISCONTINUED] ELIQUIS 5 MG TABS tablet TAKE ONE TABLET TWICE DAILY  . [DISCONTINUED] escitalopram (LEXAPRO) 20 MG tablet Take 1 tablet daily  . [DISCONTINUED] ezetimibe (ZETIA) 10 MG tablet TAKE 1/2 TABLET DAILY  . [DISCONTINUED] lisinopril (ZESTRIL) 10 MG tablet TAKE ONE TABLET DAILY  . [DISCONTINUED] metoprolol tartrate (LOPRESSOR) 25 MG tablet TAKE ONE TABLET TWICE DAILY (Patient taking differently: Take 25 mg by mouth at bedtime.)  . [DISCONTINUED] omeprazole (PRILOSEC) 40 MG capsule TAKE ONE CAPSULE EACH DAY  . [DISCONTINUED] REPATHA SURECLICK 891 MG/ML SOAJ INJECT 1 PEN INTO SKIN EVERY 14 DAYS  . [DISCONTINUED] rosuvastatin (CRESTOR) 5 MG tablet TAKE ONE TABLET DAILY  . [DISCONTINUED] zolpidem (AMBIEN) 10 MG tablet TAKE 1/2 TO 1 TABLET AT BEDTIME AS NEEDED FOR SLEEP   No facility-administered encounter medications on file as of 03/26/2021.    Pharmacist Review  Reviewed chart for medication changes and adherence.   Recent OV, Consult or Hospital visit: 04/03/21 Dr. Ellouise Newer  04/06/21 Dr. Barrington Ellison Recent medication changes indicated: 04/03/21 Memantine 10 mg  No gaps in adherence identified. Patient has follow up scheduled with pharmacy team. No further action required.  [facility] Pharmacist Assistant [phone]  Time spent:5

## 2021-04-21 ENCOUNTER — Other Ambulatory Visit: Payer: Self-pay | Admitting: Internal Medicine

## 2021-04-27 DIAGNOSIS — Z20822 Contact with and (suspected) exposure to covid-19: Secondary | ICD-10-CM | POA: Diagnosis not present

## 2021-05-08 DIAGNOSIS — M0609 Rheumatoid arthritis without rheumatoid factor, multiple sites: Secondary | ICD-10-CM | POA: Diagnosis not present

## 2021-05-08 DIAGNOSIS — Z6825 Body mass index (BMI) 25.0-25.9, adult: Secondary | ICD-10-CM | POA: Diagnosis not present

## 2021-05-08 DIAGNOSIS — M25562 Pain in left knee: Secondary | ICD-10-CM | POA: Diagnosis not present

## 2021-05-08 DIAGNOSIS — M5030 Other cervical disc degeneration, unspecified cervical region: Secondary | ICD-10-CM | POA: Diagnosis not present

## 2021-05-12 DIAGNOSIS — M0609 Rheumatoid arthritis without rheumatoid factor, multiple sites: Secondary | ICD-10-CM | POA: Diagnosis not present

## 2021-05-20 ENCOUNTER — Telehealth: Payer: Self-pay | Admitting: Pharmacist

## 2021-05-20 IMAGING — CT CT HEAD WITHOUT CONTRAST
3 of 4 series · 13 of 47 positions shown, 15 images · non-contrast
Comparison: Three days ago

CLINICAL DATA: Minor head trauma

EXAM:
CT HEAD WITHOUT CONTRAST
TECHNIQUE: Contiguous axial images were obtained from the base of the skull
through the vertex without intravenous contrast.

[Series 3: head without · axial · non-contrast · 0.46mm/px · z∈[-112,+23]mm · 7 of 37 slices shown, 9 images]
[im 5/37  brain]
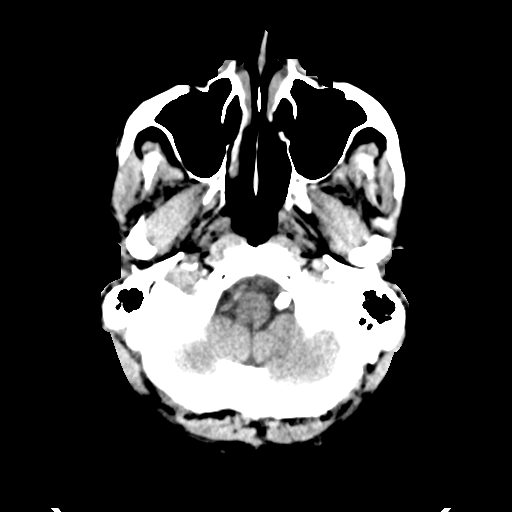
[im 5/37  bone]
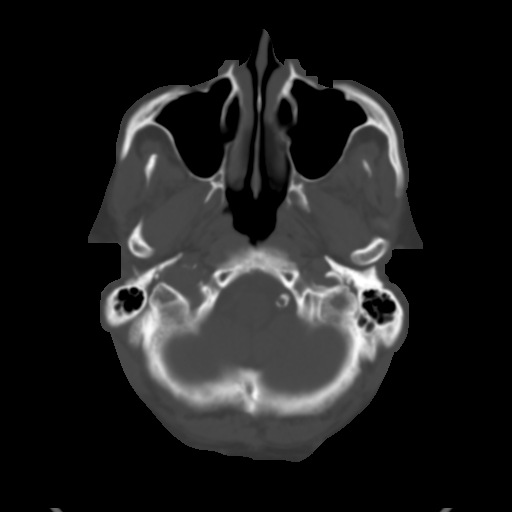
[im 10/37  brain]
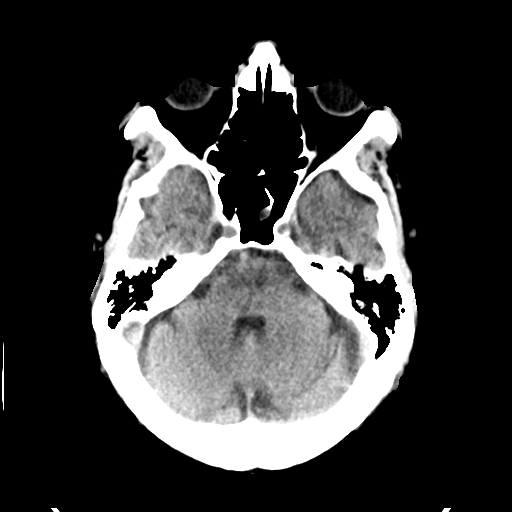
[im 14/37  brain]
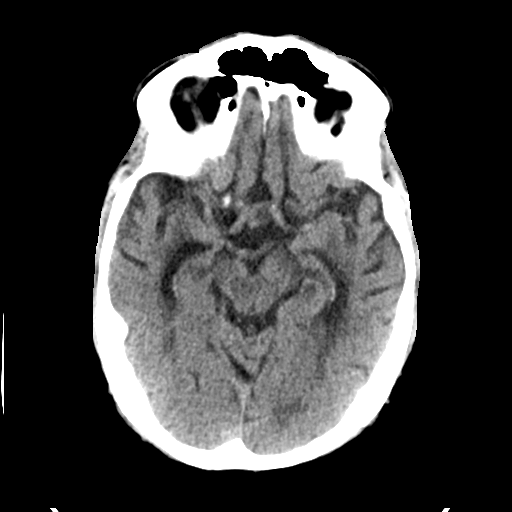
[im 19/37  brain]
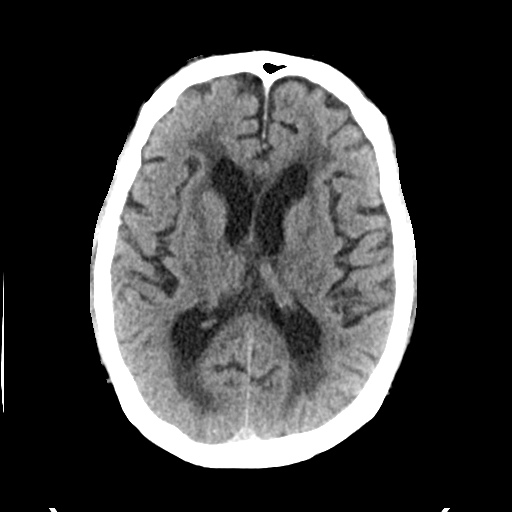
[im 23/37  brain]
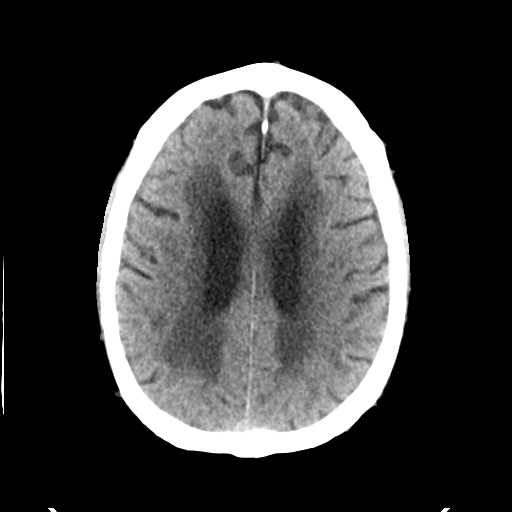
[im 23/37  bone]
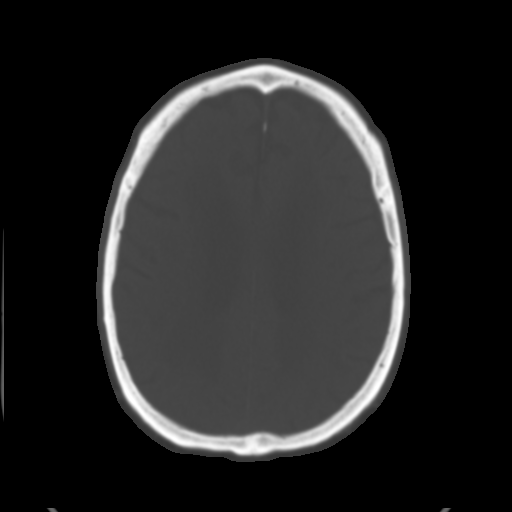
[im 28/37  brain]
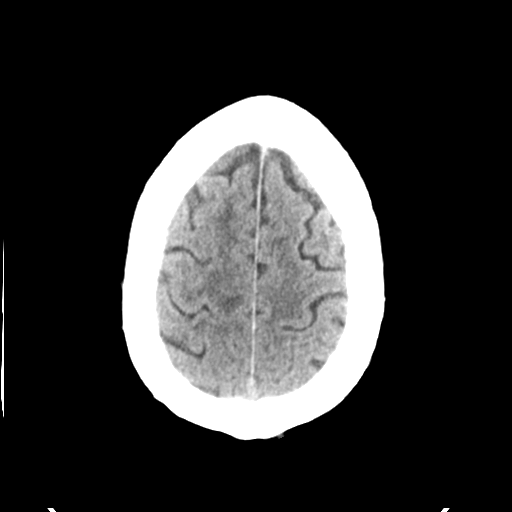
[im 32/37  brain]
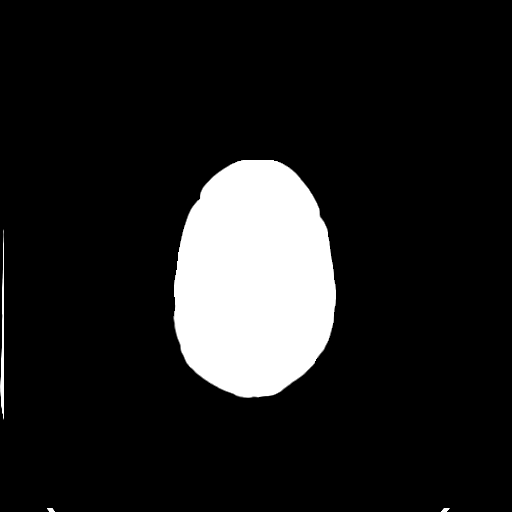

[Series 5: head without cor · coronal · non-contrast · 0.35mm/px · 3 of 73 slices shown]
[im 25/73  brain]
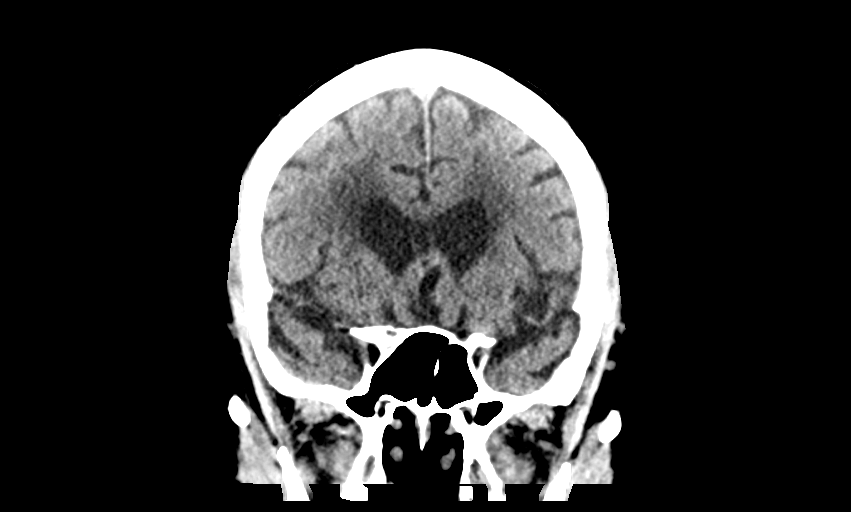
[im 33/73  brain]
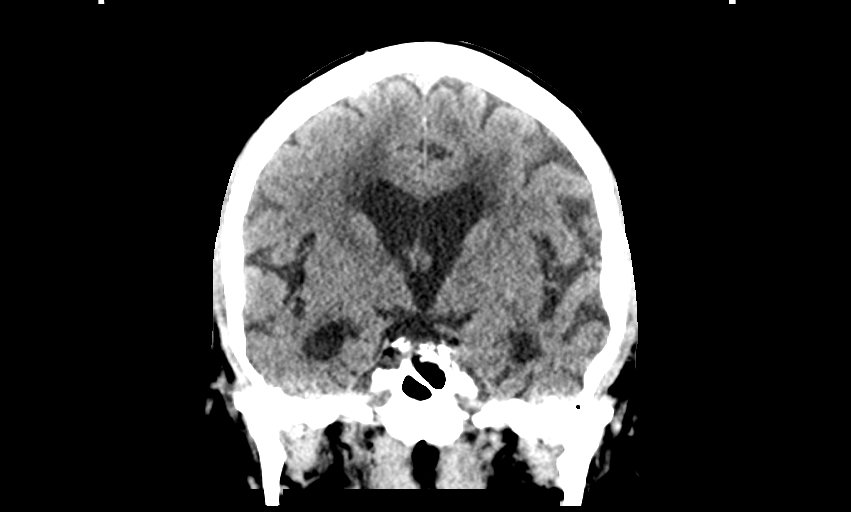
[im 41/73  brain]
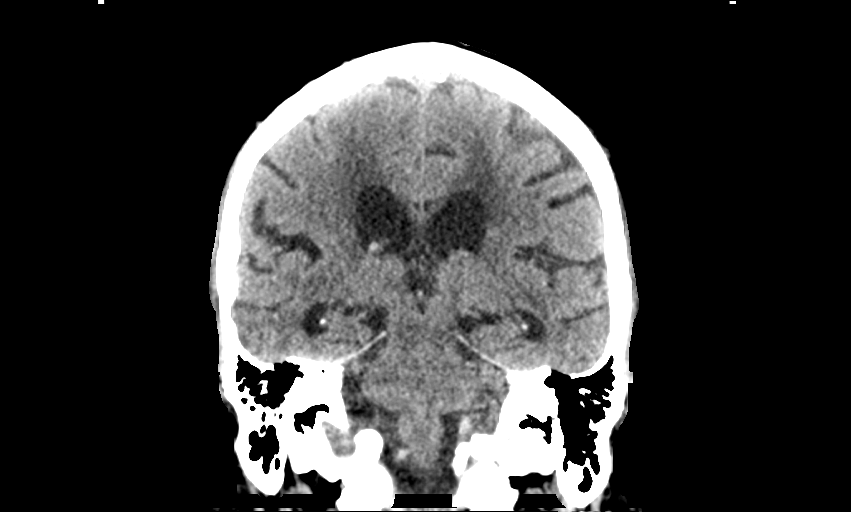

[Series 6: head without sag · sagittal · non-contrast · 0.35mm/px · 3 of 67 slices shown]
[im 23/67  brain]
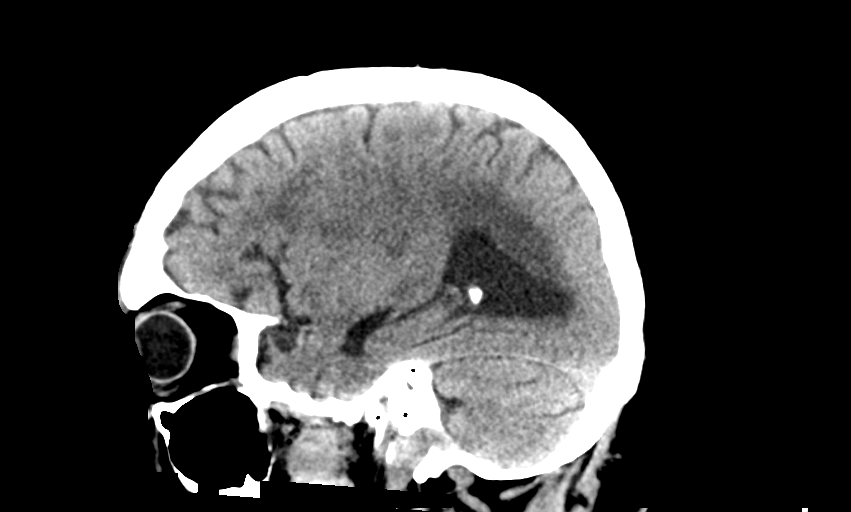
[im 34/67  brain]
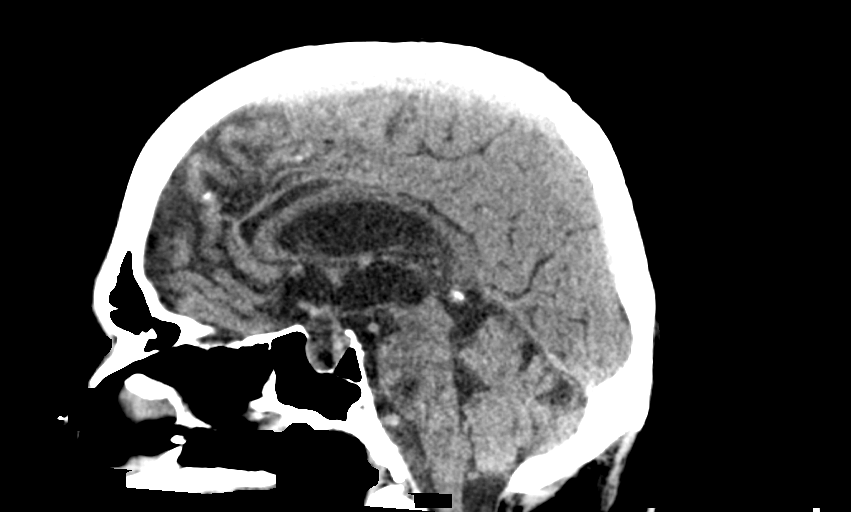
[im 45/67  brain]
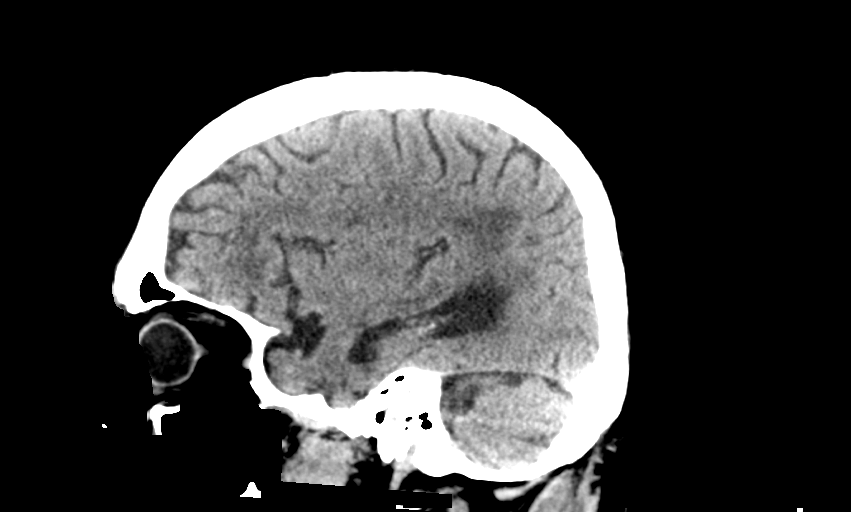

[13 of 47 positions shown; findings below may reference images not displayed]

FINDINGS: Brain: No evidence of acute infarction, hemorrhage, hydrocephalus,
extra-axial collection. There is atrophy that is moderate. Chronic
small vessel ischemia with confluent ischemic gliosis in the
cerebral white matter. There is a sellar lesion with sellar
expansion. On reformats there is a predominantly low-density/cystic
mass contacting the chiasm. Soft tissue density posteriorly could be
a fluid level or normal pituitary. No remote imaging for comparison.

Vascular: Atherosclerotic calcification.

Skull: Negative for fracture.

Sinuses/Orbits: No evidence of injury. Bilateral cataract resection.
IMPRESSION: 1. No evidence of intracranial injury.
2. Atrophy and extensive chronic small vessel ischemia.
3. Low-density/cystic lesion in the sella with chiasmatic contact.
Elective pituitary MRI could be obtained if appropriate for
comorbidities.

## 2021-05-20 NOTE — Progress Notes (Addendum)
    Chronic Care Management Pharmacy Assistant   Name: [name]  MRN: [id number] DOB: August 04, 1933  Reason for Encounter: Medication Review-Coordination Call  Medications: Outpatient Encounter Medications as of 05/20/2021  Medication Sig   acetaminophen (TYLENOL) 500 MG tablet Take 1,000 mg by mouth every 6 (six) hours as needed.   acidophilus (RISAQUAD) CAPS capsule Take 1 capsule by mouth daily.   apixaban (ELIQUIS) 5 MG TABS tablet Take 1 tablet (5 mg total) by mouth 2 (two) times daily.   aspirin EC 81 MG tablet Take 81 mg by mouth daily.   donepezil (ARICEPT) 10 MG tablet Take 1/2 tablet daily   escitalopram (LEXAPRO) 20 MG tablet Take 1 tablet daily   Evolocumab (REPATHA SURECLICK) 536 MG/ML SOAJ INJECT 1 PEN INTO SKIN EVERY 14 DAYS   ezetimibe (ZETIA) 10 MG tablet Take 0.5 tablets (5 mg total) by mouth daily.   lisinopril (ZESTRIL) 10 MG tablet Take 1 tablet (10 mg total) by mouth daily.   memantine (NAMENDA) 10 MG tablet Take 1 tablet (10 mg total) by mouth at bedtime.   metoprolol tartrate (LOPRESSOR) 25 MG tablet Take 1 tablet (25 mg total) by mouth 2 (two) times daily.   nitroGLYCERIN (NITROSTAT) 0.4 MG SL tablet ONE TABLET UNDER TONGUE AS NEEDED FOR CHEST PAIN. MAY REPEAT IN 5 MINUTES AND AGAIN IN 5 MINUTES (TOTAL OF 3 TABLETS)   omeprazole (PRILOSEC) 40 MG capsule TAKE ONE CAPSULE EACH DAY   oxyCODONE-acetaminophen (PERCOCET/ROXICET) 5-325 MG tablet TAKE ONE TABLET EVERY EIGHT HOURS AS NEEDED FOR SEVERE PAIN   Propylene Glycol (SYSTANE BALANCE OP) Place 2 drops into both eyes daily as needed (dry eyes).    rosuvastatin (CRESTOR) 5 MG tablet Take 1 tablet (5 mg total) by mouth daily.   zolpidem (AMBIEN) 10 MG tablet Take 1 by mouth at bedtime for sleep   No facility-administered encounter medications on file as of 05/20/2021.    Reviewed chart for medication changes ahead of medication coordination call.  No OVs, Consults, or hospital visits since last care coordination  call/Pharmacist visit. (If appropriate, list visit date, provider name)  No medication changes indicated OR if recent visit, treatment plan here.  BP Readings from Last 3 Encounters:  04/08/21 122/60  04/03/21 137/81  03/02/21 102/62    Lab Results  Component Value Date   HGBA1C 6.4 08/30/2017     Patient obtains medications through Adherence Packaging  90 Days   Last adherence delivery included: New Patient  Patient is due for next adherence delivery on: 05/29/21. Called patient and reviewed medications and coordinated delivery.  This delivery to include: Lisinopril 10 mg 1 tab at breakfast Omeprazole 40 mg 1 tab at breakfast Esitalopram 20 mg 1 tab at breakfast Metoprolol Tartrate 25 mg 1 tab at breakfast Repatha 140 inject 1 pen every 14 days Donepezil 10 mg 0.5 tab at breakfast Memantine 10 mg 0.5 tab at bedtime Ezetimibe 10 mg 0.5 tab at breakfast Zolpidem 10 mg 1 tab at bedtime PRN (Vial) Eliquis 5 mg 1 tab at breakfast, bedtime Rosuvastatin 5 mg 1 tab at breakfast  Confirmed delivery date of 05/29/21, advised patient that pharmacy will contact them the morning of delivery.  Star Rating Drugs: Lisinopril - last fill 03/31/21 62D Rosuvastatin - last fill 01/29/21 [place], RMA Clinical Pharmacists Assistant [phone]  Time Spent: [phone]

## 2021-05-25 ENCOUNTER — Other Ambulatory Visit: Payer: Self-pay

## 2021-05-25 ENCOUNTER — Encounter: Payer: Self-pay | Admitting: Internal Medicine

## 2021-05-25 ENCOUNTER — Ambulatory Visit (INDEPENDENT_AMBULATORY_CARE_PROVIDER_SITE_OTHER): Payer: Medicare Other | Admitting: Internal Medicine

## 2021-05-25 DIAGNOSIS — F4321 Adjustment disorder with depressed mood: Secondary | ICD-10-CM

## 2021-05-25 DIAGNOSIS — M069 Rheumatoid arthritis, unspecified: Secondary | ICD-10-CM

## 2021-05-25 DIAGNOSIS — F0280 Dementia in other diseases classified elsewhere without behavioral disturbance: Secondary | ICD-10-CM | POA: Diagnosis not present

## 2021-05-25 DIAGNOSIS — I2510 Atherosclerotic heart disease of native coronary artery without angina pectoris: Secondary | ICD-10-CM | POA: Diagnosis not present

## 2021-05-25 DIAGNOSIS — G308 Other Alzheimer's disease: Secondary | ICD-10-CM

## 2021-05-25 NOTE — Assessment & Plan Note (Addendum)
Zetia, Eliquis, Toprol, Crestor and Repatha Do labs ordered in 7/22

## 2021-05-25 NOTE — Assessment & Plan Note (Signed)
Discussed w/Morell

## 2021-05-25 NOTE — Assessment & Plan Note (Addendum)
On  Donepezil  No driving is discussed

## 2021-05-25 NOTE — Progress Notes (Addendum)
Subjective:  Patient ID: [name], male    DOB: 11-28-1932  Age: 87 y.o. MRN: [id number]  CC: Follow-up (3 month f/u)   HPI [name] presents for memory loss, depression, CAD f/u Javelle is upset, because he is discouraged to drive  Outpatient Medications Prior to Visit  Medication Sig Dispense Refill   acetaminophen (TYLENOL) 500 MG tablet Take 1,000 mg by mouth every 6 (six) hours as needed.     acidophilus (RISAQUAD) CAPS capsule Take 1 capsule by mouth daily.     apixaban (ELIQUIS) 5 MG TABS tablet Take 1 tablet (5 mg total) by mouth 2 (two) times daily. 180 tablet 1   aspirin EC 81 MG tablet Take 81 mg by mouth daily.     donepezil (ARICEPT) 10 MG tablet Take 1/2 tablet daily 45 tablet 3   escitalopram (LEXAPRO) 20 MG tablet Take 1 tablet daily 90 tablet 3   Evolocumab (REPATHA SURECLICK) 119 MG/ML SOAJ INJECT 1 PEN INTO SKIN EVERY 14 DAYS 2 mL 11   ezetimibe (ZETIA) 10 MG tablet Take 0.5 tablets (5 mg total) by mouth daily. 45 tablet 3   lisinopril (ZESTRIL) 10 MG tablet Take 1 tablet (10 mg total) by mouth daily. 90 tablet 1   memantine (NAMENDA) 10 MG tablet Take 1 tablet (10 mg total) by mouth at bedtime. 90 tablet 3   metoprolol tartrate (LOPRESSOR) 25 MG tablet Take 1 tablet (25 mg total) by mouth 2 (two) times daily. 180 tablet 3   nitroGLYCERIN (NITROSTAT) 0.4 MG SL tablet ONE TABLET UNDER TONGUE AS NEEDED FOR CHEST PAIN. MAY REPEAT IN 5 MINUTES AND AGAIN IN 5 MINUTES (TOTAL OF 3 TABLETS) 25 tablet 6   omeprazole (PRILOSEC) 40 MG capsule TAKE ONE CAPSULE EACH DAY 90 capsule 3   oxyCODONE-acetaminophen (PERCOCET/ROXICET) 5-325 MG tablet TAKE ONE TABLET EVERY EIGHT HOURS AS NEEDED FOR SEVERE PAIN 90 tablet 0   Propylene Glycol (SYSTANE BALANCE OP) Place 2 drops into both eyes daily as needed (dry eyes).      rosuvastatin (CRESTOR) 5 MG tablet Take 1 tablet (5 mg total) by mouth daily. 90 tablet 1   zolpidem (AMBIEN) 10 MG tablet Take 1 by mouth at bedtime for sleep 90  tablet 1   No facility-administered medications prior to visit.    ROS: Review of Systems  Constitutional:  Negative for appetite change, fatigue and unexpected weight change.  HENT:  Negative for congestion, nosebleeds, sneezing, sore throat and trouble swallowing.   Eyes:  Negative for itching and visual disturbance.  Respiratory:  Negative for cough.   Cardiovascular:  Negative for chest pain, palpitations and leg swelling.  Gastrointestinal:  Negative for abdominal distention, blood in stool, diarrhea and nausea.  Genitourinary:  Negative for frequency and hematuria.  Musculoskeletal:  Positive for arthralgias and back pain. Negative for gait problem, joint swelling and neck pain.  Skin:  Negative for rash.  Neurological:  Negative for dizziness, tremors, speech difficulty and weakness.  Psychiatric/Behavioral:  Positive for confusion, decreased concentration, dysphoric mood and sleep disturbance. Negative for agitation, behavioral problems and suicidal ideas. The patient is not nervous/anxious.    Objective:  BP 130/78 (BP Location: Left Arm)   Pulse 62   Temp 98.3 F (36.8 C) (Oral)   Ht 6' (1.829 m)   Wt 182 lb 6.4 oz (82.7 kg)   SpO2 97%   BMI 24.74 kg/m   BP Readings from Last 3 Encounters:  05/25/21 130/78  04/08/21 122/60  04/03/21 137/81    Wt Readings from Last 3 Encounters:  05/25/21 182 lb 6.4 oz (82.7 kg)  04/08/21 180 lb 12.8 oz (82 kg)  04/03/21 180 lb (81.6 kg)    Physical Exam Constitutional:      General: He is not in acute distress.    Appearance: He is well-developed.     Comments: NAD  Eyes:     Conjunctiva/sclera: Conjunctivae normal.     Pupils: Pupils are equal, round, and reactive to light.  Neck:     Thyroid: No thyromegaly.     Vascular: No JVD.  Cardiovascular:     Rate and Rhythm: Normal rate and regular rhythm.     Heart sounds: Normal heart sounds. No murmur heard.   No friction rub. No gallop.  Pulmonary:     Effort:  Pulmonary effort is normal. No respiratory distress.     Breath sounds: Normal breath sounds. No wheezing or rales.  Chest:     Chest wall: No tenderness.  Abdominal:     General: Bowel sounds are normal. There is no distension.     Palpations: Abdomen is soft. There is no mass.     Tenderness: There is no abdominal tenderness. There is no guarding or rebound.  Musculoskeletal:        General: Tenderness present. Normal range of motion.     Cervical back: Normal range of motion.  Lymphadenopathy:     Cervical: No cervical adenopathy.  Skin:    General: Skin is warm and dry.     Findings: No rash.  Neurological:     Mental Status: He is alert and oriented to person, place, and time.     Cranial Nerves: No cranial nerve deficit.     Motor: No abnormal muscle tone.     Coordination: Coordination normal.     Gait: Gait normal.     Deep Tendon Reflexes: Reflexes are normal and symmetric.  Psychiatric:        Behavior: Behavior normal.        Thought Content: Thought content normal.    Lab Results  Component Value Date   WBC 6.7 02/15/2021   HGB 13.2 02/15/2021   HCT 39.9 02/15/2021   PLT 304 02/15/2021   GLUCOSE 93 02/15/2021   CHOL 197 08/04/2020   TRIG 254 (H) 08/04/2020   HDL 47 08/04/2020   LDLDIRECT 168.9 05/24/2013   LDLCALC 106 (H) 08/04/2020   ALT 13 02/15/2021   AST 21 02/15/2021   NA 138 02/15/2021   K 3.9 02/15/2021   CL 108 02/15/2021   CREATININE 0.82 02/15/2021   BUN 15 02/15/2021   CO2 25 02/15/2021   TSH 1.18 12/13/2017   PSA 2.46 02/21/2017   INR 0.92 09/13/2016   HGBA1C 6.4 08/30/2017    DG Chest 2 View  Result Date: 02/15/2021 CLINICAL DATA:  Presyncope EXAM: CHEST - 2 VIEW COMPARISON:  None. FINDINGS: Normal mediastinum and cardiac silhouette. Normal pulmonary vasculature. No evidence of effusion, infiltrate, or pneumothorax. No acute bony abnormality. IMPRESSION: No acute cardiopulmonary process. Electronically Signed   By: Suzy Bouchard M.D.    On: 02/15/2021 12:31    Assessment & Plan:     [name], MD

## 2021-05-25 NOTE — Assessment & Plan Note (Signed)
Prednisone Percocet prn  Potential benefits of a long term opioids use as well as potential risks (i.e. addiction risk, apnea etc) and complications (i.e. Somnolence, constipation and others) were explained to the patient and were aknowledged.

## 2021-06-05 DIAGNOSIS — Z961 Presence of intraocular lens: Secondary | ICD-10-CM | POA: Diagnosis not present

## 2021-06-05 DIAGNOSIS — H5212 Myopia, left eye: Secondary | ICD-10-CM | POA: Diagnosis not present

## 2021-06-05 DIAGNOSIS — H5201 Hypermetropia, right eye: Secondary | ICD-10-CM | POA: Diagnosis not present

## 2021-06-05 DIAGNOSIS — H52201 Unspecified astigmatism, right eye: Secondary | ICD-10-CM | POA: Diagnosis not present

## 2021-06-09 DIAGNOSIS — M0609 Rheumatoid arthritis without rheumatoid factor, multiple sites: Secondary | ICD-10-CM | POA: Diagnosis not present

## 2021-06-17 ENCOUNTER — Other Ambulatory Visit: Payer: Self-pay

## 2021-06-17 ENCOUNTER — Ambulatory Visit (INDEPENDENT_AMBULATORY_CARE_PROVIDER_SITE_OTHER): Payer: Medicare Other

## 2021-06-17 VITALS — BP 118/70 | HR 69 | Temp 98.3°F | Ht 72.0 in | Wt 184.8 lb

## 2021-06-17 DIAGNOSIS — Z0000 Encounter for general adult medical examination without abnormal findings: Secondary | ICD-10-CM | POA: Diagnosis not present

## 2021-06-17 NOTE — Progress Notes (Addendum)
Subjective:   [name] is a 87 y.o. male who presents for Medicare Annual/Subsequent preventive examination.  Review of Systems     Cardiac Risk Factors include: advanced age (>38mn, >>71women);dyslipidemia;family history of premature cardiovascular disease;hypertension;male gender     Objective:    Today's Vitals   06/17/21 1127  BP: 118/70  Pulse: 69  Temp: 98.3 F (36.8 C)  SpO2: 95%  Weight: 184 lb 12.8 oz (83.8 kg)  Height: 6' (1.829 m)  PainSc: 0-No pain   Body mass index is 25.06 kg/m.  Advanced Directives 06/17/2021 04/03/2021 08/18/2020 06/11/2020 01/04/2020 04/13/2019 04/10/2019  Does Patient Have a Medical Advance Directive? Yes Yes Yes Yes Yes No Yes  Type of Advance Directive - - -Public librarianLiving will [place]aus[place]Living will - [place] will  Does patient want to make changes to medical advance directive? No - Patient declined - - No - Patient declined - - -  Copy of HReservein Chart? - - - - - - -  Would patient like information on creating a medical advance directive? - - - - - No - Patient declined -  Pre-existing out of facility DNR order (yellow form or pink MOST form) - - - - - - -    Current Medications (verified) Outpatient Encounter Medications as of 06/17/2021  Medication Sig   acetaminophen (TYLENOL) 500 MG tablet Take 1,000 mg by mouth every 6 (six) hours as needed.   acidophilus (RISAQUAD) CAPS capsule Take 1 capsule by mouth daily.   apixaban (ELIQUIS) 5 MG TABS tablet Take 1 tablet (5 mg total) by mouth 2 (two) times daily.   aspirin EC 81 MG tablet Take 81 mg by mouth daily.   donepezil (ARICEPT) 10 MG tablet Take 1/2 tablet daily   escitalopram (LEXAPRO) 20 MG tablet Take 1 tablet daily   Evolocumab (REPATHA SURECLICK) [id number]/ML SOAJ INJECT 1 PEN INTO SKIN EVERY 14 DAYS   ezetimibe (ZETIA) 10 MG tablet Take 0.5 tablets (5 mg total) by mouth daily.   lisinopril  (ZESTRIL) 10 MG tablet Take 1 tablet (10 mg total) by mouth daily.   memantine (NAMENDA) 10 MG tablet Take 1 tablet (10 mg total) by mouth at bedtime.   metoprolol tartrate (LOPRESSOR) 25 MG tablet Take 1 tablet (25 mg total) by mouth 2 (two) times daily.   nitroGLYCERIN (NITROSTAT) 0.4 MG SL tablet ONE TABLET UNDER TONGUE AS NEEDED FOR CHEST PAIN. MAY REPEAT IN 5 MINUTES AND AGAIN IN 5 MINUTES (TOTAL OF 3 TABLETS)   omeprazole (PRILOSEC) 40 MG capsule TAKE ONE CAPSULE EACH DAY   oxyCODONE-acetaminophen (PERCOCET/ROXICET) 5-325 MG tablet TAKE ONE TABLET EVERY EIGHT HOURS AS NEEDED FOR SEVERE PAIN   Propylene Glycol (SYSTANE BALANCE OP) Place 2 drops into both eyes daily as needed (dry eyes).    rosuvastatin (CRESTOR) 5 MG tablet Take 1 tablet (5 mg total) by mouth daily.   zolpidem (AMBIEN) 10 MG tablet Take 1 by mouth at bedtime for sleep   No facility-administered encounter medications on file as of 06/17/2021.    Allergies (verified) Methotrexate, Aricept [donepezil hcl], Crestor [rosuvastatin calcium], Lisinopril, Namenda [memantine], Atorvastatin, and Pravastatin sodium   History: Past Medical History:  Diagnosis Date   CAD (coronary artery disease)    a. s/p inferior STEMI 01/08/2014; LHC 01/08/14: total RCA occlusion s/p 3.5x221mXience DES distal RCA and 3.5x28 mm DES mid RCA, 60-70% mid LAD stenosis, EF 55%.  Complication of anesthesia    'long to wake up after back surgery " 09/2003   Diverticulosis of colon    GERD (gastroesophageal reflux disease)    History of cellulitis    05-26-2015  LLE   History of colon polyps    1998- benign/  2008 adenomatous    History of kidney stones    2013   History of squamous cell carcinoma excision    2013;  2015;  06-12-2015 right leg/  02/ and 05/ 2017  left ear and left leg   Hydronephrosis, left    Hyperlipidemia    Hypertension    Migraine with aura    OSA on CPAP 06/19/2015   Moderate OSA with AHI 18/hr  per study 05-20-2015    Osteoarthritis    Paroxysmal atrial fibrillation ([place]) 4/16   chads2vasc score is at least 4   Premature atrial contractions    RA (rheumatoid arthritis) [facility])    rheumatologist-  dr Leigh Aurora   Sinusitis, chronic 10/17/2015   Wears glasses    Wears hearing aid    bilateral   Past Surgical History:  Procedure Laterality Date   BACK SURGERY     CARDIOVASCULAR STRESS TEST  11/21/2015   Low risk nuclear study w/ a small diaphragmatic attenuation artifact, no ischemia/  normal LV function and wall motion , ef 63%   CATARACT EXTRACTION W/ INTRAOCULAR LENS  IMPLANT, BILATERAL  2015   COLONOSCOPY  last one 06-09-2011   CORONARY ANGIOPLASTY     CYSTOSCOPY W/ RETROGRADES Left 07/12/2016   Procedure: CYSTOSCOPY WITH RETROGRADE PYELOGRAM LEFT URETERAL STENT;  Surgeon: Irine Seal, MD;  Location: [facility];  Service: Urology;  Laterality: Left;   CYSTOSCOPY WITH RETROGRADE PYELOGRAM, URETEROSCOPY AND STENT PLACEMENT Left 08/11/2016   Procedure: CYSTOSCOPY WITH RETROGRADE PYELOGRAM,  DIAGNOSTIC URETEROSCOPY , STENT EXCHANGE;  Surgeon: Alexis Frock, MD;  Location[facility];  Service: Urology;  Laterality: Left;   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   severe fibromatosis palm and fingers   LEFT HEART CATHETERIZATION WITH CORONARY ANGIOGRAM N/A 01/08/2014   Procedure: LEFT HEART CATHETERIZATION WITH CORONARY ANGIOGRAM;  Surgeon: Troy Sine, MD;  Location[facility] CATH LAB;  Service: Cardiovascular;  Laterality:N/A;  total/ subtotal RCA/  mLAD [id number] w/ mid systolic bridging/  preserved global LVF, ef 55%   MOHS SURGERY  x2  feb and may 2017   left ear /  left leg  ([facility])   PERCUTANEOUS CORONARY STENT INTERVENTION (PCI-S)  01/08/2014   Procedure: PERCUTANEOUS CORONARY STENT INTERVENTION (PCI-S);  Surgeon: Troy Sine, MD;  [facility] C[facility] LAB;  Service: Cardiovascular;;  DES to mid and distal RCA   POSTERIOR LUMBAR FUSION  10/01/2003   and Laminectomy/ diskectomy  L4 -- S1    ROBOT ASSISTED PYELOPLASTY Left 09/15/2016   Procedure: XI ROBOTIC ASSISTED PYELOPLASTY;  Surgeon: Alexis Frock, MD;  Location: [facility];  Service: Urology;  Laterality: Left;   TONSILLECTOMY     TRANSTHORACIC ECHOCARDIOGRAM  02/23/2016   mild LVH,  ef 50-55%,  grade 1 diastolic dysfunction/  mild to moderate AV calcification w/ no stenosis or regurg./  trivial MR and TR/  mild PR   Family History  Problem Relation Age of Onset   Hypertension Mother    Heart disease Father    Colon cancer Neg Hx    Social History   Socioeconomic History   Marital status: Married    Spouse name: Not on file   Number of children:  2   Years of education: Not on file   Highest education level: Not on file  Occupational History   Occupation: Retired    Fish farm manager: RETIRED  Tobacco Use   Smoking status: Former    Years: 10.00    Types: Cigarettes    Quit date: 08/09/1968    Years since quitting: 52.8   Smokeless tobacco: Never  Vaping Use   Vaping Use: Never used  Substance and Sexual Activity   Alcohol use: Yes    Alcohol/week: 2.0 standard drinks    Types: 2 Shots of liquor per week    Comment: daily   Drug use: No   Sexual activity: Yes    Partners: Female  Other Topics Concern   Not on file  Social History Narrative   1 Caffeine drink daily    Right handed       Lives with wife      Social Determinants of Health   Financial Resource Strain: Low Risk    Difficulty of Paying Living Expenses: Not hard at all  Food Insecurity: No Food Insecurity   Worried About Charity fundraiser in the Last Year: Never true   Arboriculturist in the Last Year: Never true  Transportation Needs: No Transportation Needs   Lack of Transportation (Medical): No   Lack of Transportation (Non-Medical): No  Physical Activity: Sufficiently Active   Days of Exercise per Week: 5 days   Minutes of Exercise per Session: 30 min  Stress: No Stress Concern Present   Feeling of Stress : Not at all  Social  Connections: Socially Integrated   Frequency of Communication with Friends and Family: More than three times a week   Frequency of Social Gatherings with Friends and Family: More than three times a week   Attends Religious Services: More than 4 times per year   Active Member of [employer] or Organizations: Yes   Attends Music therapist: More than 4 times per year   Marital Status: Married    Tobacco Counseling Counseling given: Not Answered   Clinical Intake:  Pre-visit preparation completed: Yes  Pain : No/denies pain Pain Score: 0-No pain     BMI - recorded: 25.06 Nutritional Status: BMI 25 -29 Overweight Nutritional Risks: None Diabetes: No  How often do you need to have someone help you when you read instructions, pamphlets, or other written materials from your doctor or pharmacy?: 1 - Never What is the last grade level you completed in school?: Bachelor's Degree  Diabetic? no  Interpreter Needed?: No  Information entered by :: Lisette Abu, LPN   Activities of Daily Living In your present state of health, do you have any difficulty performing the following activities: 06/17/2021  Hearing? N  Comment Patient wears hearing aids  Vision? N  Difficulty concentrating or making decisions? N  Walking or climbing stairs? N  Dressing or bathing? N  Doing errands, shopping? Y  Preparing Food and eating ? N  Using the Toilet? N  In the past six months, have you accidently leaked urine? N  Do you have problems with loss of bowel control? N  Managing your Medications? N  Managing your Finances? N  Housekeeping or managing your Housekeeping? N  Some recent data might be hidden    Patient Care Team: Plotnikov, Evie Lacks, MD as PCP - General Sueanne Margarita, MD as PCP - Cardiology (Cardiology) Thompson Grayer, MD as PCP - Electrophysiology (Cardiology) Irene Shipper, MD (Gastroenterology)  Thompson Grayer, MD (Cardiology) Hennie Duos, MD as Consulting  Physician (Rheumatology) Alexis Frock, MD as Consulting Physician (Urology) Katy Apo, MD as Consulting Physician (Ophthalmology) Mathis Fare (Dentistry) Tiajuana Amass, MD as Referring Physician (Allergy and Immunology) Cameron Sprang, MD as Consulting Physician (Neurology) Charlton Haws, [facility] as Pharmacist (Pharmacist)  Indicate any recent Medical Services you may have received from other than [facility] providers in the past year (date may be approximate).     Assessment:   This is a routine wellness examination for Kaevon.  Hearing/Vision screen No results found.  Dietary issues and exercise activities discussed: Current Exercise Habits: Home exercise routine, Type of exercise: walking (walking the dog, stairclimbing and restart exercise classes 06/18/2021 at [facility] Time (Minutes): 30, Frequency (Times/Week): 5, Weekly Exercise (Minutes/Week): 150, Intensity: Mild, Exercise limited by: cardiac condition(s);respiratory conditions(s);orthopedic condition(s)   Goals Addressed               This Visit's Progress     Patient Stated   On track     Limit alcohol consumption       patient states (pt-stated)   On track     Maintain current weight by increasing activity and making healthy food choices.        Depression Screen PHQ 2/9 Scores 06/17/2021 06/11/2020 11/23/2018 11/22/2017 11/18/2016 12/26/2014 04/19/2014  PHQ - 2 Score 0 0 0 1 0 0 0  PHQ- 9 Score - - - 3 - - -  Exception Documentation - - - - - Patient refusal -    Fall Risk Fall Risk  06/17/2021 04/03/2021 08/18/2020 06/11/2020 01/04/2020  Falls in the past year? 1 0 0 0 1  Number falls in past yr: 0 0 0 0 0  Comment - - - - -  Injury with Fall? 0 0 0 0 1  Risk for fall due to : - - - No Fall Risks -  Follow up Falls evaluation completed - - Falls evaluation completed -    FALL RISK PREVENTION PERTAINING TO THE HOME:  Any stairs in or around the home? Yes  If so, are there any without handrails? No  Home free  of loose throw rugs in walkways, pet beds, electrical cords, etc? Yes  Adequate lighting in your home to reduce risk of falls? Yes   ASSISTIVE DEVICES UTILIZED TO PREVENT FALLS:  Life alert? Yes  Use of a cane, walker or w/c? No  Grab bars in the bathroom? Yes  Shower chair or bench in shower? Yes  Elevated toilet seat or a handicapped toilet? Yes   TIMED UP AND GO:  Was the test performed? Yes .  Length of time to ambulate 10 feet: 6 sec.   Gait steady and fast without use of assistive device  Cognitive Function: Patient has current diagnosis of cognitive impairment. Patient is followed by neurology for ongoing assessment.   MMSE - Mini Mental State Exam 04/03/2021 11/22/2017  Orientation to time 1 5  Orientation to Place 4 5  Registration 3 3  Attention/ Calculation 4 5  Recall 0 1  Language- name 2 objects 2 2  Language- repeat 1 1  Language- follow 3 step command 3 3  Language- read & follow direction 1 1  Write a sentence 1 1  Copy design 1 1  Total score 21 28   Montreal Cognitive Assessment  03/10/2020 04/16/2019  Visuospatial/ Executive (0/5) 1 3  Naming (0/3) 3 3  Attention: Read list of digits (0/2) 2  2  Attention: Read list of letters (0/1) 1 1  Attention: Serial 7 subtraction starting at 100 (0/3) 3 3  Language: Repeat phrase (0/2) 1 1  Language : Fluency (0/1) 1 1  Abstraction (0/2) 1 2  Delayed Recall (0/5) 0 2  Orientation (0/6) 3 6  Total 16 24  Adjusted Score (based on education) 16 -   6CIT Screen 06/11/2020  What Year? 0 points  What month? 0 points  What time? 0 points  Count back from 20 0 points  Months in reverse 0 points  Repeat phrase 0 points  Total Score 0    Immunizations Immunization History  Administered Date(s) Administered   Fluad Quad(high Dose 65+) 07/09/2019, 08/15/2020   Influenza Split 08/10/2011, 08/15/2012   Influenza Whole 08/21/2009, 09/22/2010   Influenza, High Dose Seasonal PF 09/16/2015, 08/18/2016, 08/31/2017,  08/14/2018   Influenza,inj,Quad PF,6+ Mos 08/06/2013   Influenza-Unspecified 08/31/2017   PFIZER(Purple Top)SARS-COV-2 Vaccination 12/11/2019, 01/01/2020, 08/12/2020   Pneumococcal Conjugate-13 10/02/2013   Pneumococcal Polysaccharide-23 06/04/2010   Tdap 10/22/2011   Zoster Recombinat (Shingrix) 03/09/2017, 05/10/2017    TDAP status: Up to date  Flu Vaccine status: Up to date  Pneumococcal vaccine status: Up to date  Covid-19 vaccine status: Completed vaccines  Qualifies for Shingles Vaccine? Yes   Zostavax completed Yes   Shingrix Completed?: Yes  Screening Tests Health Maintenance  Topic Date Due   COVID-19 Vaccine (4 - Booster for Pfizer series) 11/11/2020   INFLUENZA VACCINE  06/15/2021   TETANUS/TDAP  10/21/2021   PNA vac Low Risk Adult  Completed   Zoster Vaccines- Shingrix  Completed   HPV VACCINES  Aged Out    Health Maintenance  Health Maintenance Due  Topic Date Due   COVID-19 Vaccine (4 - Booster for Pfizer series) 11/11/2020   INFLUENZA VACCINE  06/15/2021    Colorectal cancer screening: No longer required.   Lung Cancer Screening: (Low Dose CT Chest recommended if Age 22-80 years, 30 pack-year currently smoking OR have quit w/in 15years.) does not qualify.   Lung Cancer Screening Referral: no  Additional Screening:  Hepatitis C Screening: does not qualify; Completed no  Vision Screening: Recommended annual ophthalmology exams for early detection of glaucoma and other disorders of the eye. Is the patient up to date with their annual eye exam?  Yes  Who is the provider or what is the name of the office in which the patient attends annual eye exams? Katy Apo, MD. If pt is not established with a provider, would they like to be referred to a provider to establish care? No .   Dental Screening: Recommended annual dental exams for proper oral hygiene  Community Resource Referral / Chronic Care Management: CRR required this visit?  No   CCM  required this visit?  No      Plan:     I have personally reviewed and noted the following in the patient's chart:   Medical and social history Use of alcohol, tobacco or illicit drugs  Current medications and supplements including opioid prescriptions. Patient is currently taking opioid prescriptions. Information provided to patient regarding non-opioid alternatives. Patient advised to discuss non-opioid treatment plan with their provider. Functional ability and status Nutritional status Physical activity Advanced directives List of other physicians Hospitalizations, surgeries, and ER visits in previous 12 months Vitals Screenings to include cognitive, depression, and falls Referrals and appointments  In addition, I have reviewed and discussed with patient certain preventive protocols, quality metrics, and best practice recommendations. A  written personalized care plan for preventive services as well as general preventive health recommendations were provided to patient.     Sheral Flow, LPN   [id number]   Nurse Notes: n/a Medical screening examination/treatment/procedure(s) were performed by non-physician practitioner and as supervising physician I was immediately available for consultation/collaboration.  I agree with above. Lew Dawes, MD

## 2021-06-17 NOTE — Patient Instructions (Signed)
Mr. Hi , Thank you for taking time to come for your Medicare Wellness Visit. I appreciate your ongoing commitment to your health goals. Please review the following plan we discussed and let me know if I can assist you in the future.   Screening recommendations/referrals: Colonoscopy: not a candidate for screening due to age Recommended yearly ophthalmology/optometry visit for glaucoma screening and checkup Recommended yearly dental visit for hygiene and checkup  Vaccinations: Influenza vaccine: 08/27/2020; due Fall 2022 Pneumococcal vaccine: 17/21/2011, 1/18/201 Tdap vaccine: 10/22/2011; due every 10 years (Due: 10/21/2021) Shingles vaccine: 03/09/2017, 05/10/2017   Covid-19: 12/11/2019, 01/01/2020, 08/12/2020  Advanced directives: Yes; documents on file.  Conditions/risks identified: Yes; Client understands the importance of follow-up with providers by attending scheduled visits and discussed goals to eat healthier, increase physical activity, exercise the brain, socialize more, get enough sleep and make time for laughter.  Next appointment: Please schedule your next Medicare Wellness Visit with your Nurse Health Advisor in 1 year by calling [phone].  Preventive Care 65 Years and Older, Male Preventive care refers to lifestyle choices and visits with your health care provider that can promote health and wellness. What does preventive care include? A yearly physical exam. This is also called an annual well check. Dental exams once or twice a year. Routine eye exams. Ask your health care provider how often you should have your eyes checked. Personal lifestyle choices, including: Daily care of your teeth and gums. Regular physical activity. Eating a healthy diet. Avoiding tobacco and drug use. Limiting alcohol use. Practicing safe sex. Taking low doses of aspirin every day. Taking vitamin and mineral supplements as recommended by your health care provider. What happens during an  annual well check? The services and screenings done by your health care provider during your annual well check will depend on your age, overall health, lifestyle risk factors, and family history of disease. Counseling  Your health care provider may ask you questions about your: Alcohol use. Tobacco use. Drug use. Emotional well-being. Home and relationship well-being. Sexual activity. Eating habits. History of falls. Memory and ability to understand (cognition). Work and work Statistician. Screening  You may have the following tests or measurements: Height, weight, and BMI. Blood pressure. Lipid and cholesterol levels. These may be checked every 5 years, or more frequently if you are over 62 years old. Skin check. Lung cancer screening. You may have this screening every year starting at age 67 if you have a 30-pack-year history of smoking and currently smoke or have quit within the past 15 years. Fecal occult blood test (FOBT) of the stool. You may have this test every year starting at age 24. Flexible sigmoidoscopy or colonoscopy. You may have a sigmoidoscopy every 5 years or a colonoscopy every 10 years starting at age 58. Prostate cancer screening. Recommendations will vary depending on your family history and other risks. Hepatitis C blood test. Hepatitis B blood test. Sexually transmitted disease (STD) testing. Diabetes screening. This is done by checking your blood sugar (glucose) after you have not eaten for a while (fasting). You may have this done every 1-3 years. Abdominal aortic aneurysm (AAA) screening. You may need this if you are a current or former smoker. Osteoporosis. You may be screened starting at age 11 if you are at high risk. Talk with your health care provider about your test results, treatment options, and if necessary, the need for more tests. Vaccines  Your health care provider may recommend certain vaccines, such as: Influenza vaccine. This is  recommended  every year. Tetanus, diphtheria, and acellular pertussis (Tdap, Td) vaccine. You may need a Td booster every 10 years. Zoster vaccine. You may need this after age 21. Pneumococcal 13-valent conjugate (PCV13) vaccine. One dose is recommended after age 23. Pneumococcal polysaccharide (PPSV23) vaccine. One dose is recommended after age 81. Talk to your health care provider about which screenings and vaccines you need and how often you need them. This information is not intended to replace advice given to you by your health care provider. Make sure you discuss any questions you have with your health care provider. Document Released: 11/28/2015 Document Revised: 07/21/2016 Document Reviewed: 09/02/2015 Elsevier Interactive Patient Education  2017 [place] Prevention in the Home Falls can cause injuries. They can happen to people of all ages. There are many things you can do to make your home safe and to help prevent falls. What can I do on the outside of my home? Regularly fix the edges of walkways and driveways and fix any cracks. Remove anything that might make you trip as you walk through a door, such as a raised step or threshold. Trim any bushes or trees on the path to your home. Use bright outdoor lighting. Clear any walking paths of anything that might make someone trip, such as rocks or tools. Regularly check to see if handrails are loose or broken. Make sure that both sides of any steps have handrails. Any raised decks and porches should have guardrails on the edges. Have any leaves, snow, or ice cleared regularly. Use sand or salt on walking paths during winter. Clean up any spills in your garage right away. This includes oil or grease spills. What can I do in the bathroom? Use night lights. Install grab bars by the toilet and in the tub and shower. Do not use towel bars as grab bars. Use non-skid mats or decals in the tub or shower. If you need to sit down in the shower,  use a plastic, non-slip stool. Keep the floor dry. Clean up any water that spills on the floor as soon as it happens. Remove soap buildup in the tub or shower regularly. Attach bath mats securely with double-sided non-slip rug tape. Do not have throw rugs and other things on the floor that can make you trip. What can I do in the bedroom? Use night lights. Make sure that you have a light by your bed that is easy to reach. Do not use any sheets or blankets that are too big for your bed. They should not hang down onto the floor. Have a firm chair that has side arms. You can use this for support while you get dressed. Do not have throw rugs and other things on the floor that can make you trip. What can I do in the kitchen? Clean up any spills right away. Avoid walking on wet floors. Keep items that you use a lot in easy-to-reach places. If you need to reach something above you, use a strong step stool that has a grab bar. Keep electrical cords out of the way. Do not use floor polish or wax that makes floors slippery. If you must use wax, use non-skid floor wax. Do not have throw rugs and other things on the floor that can make you trip. What can I do with my stairs? Do not leave any items on the stairs. Make sure that there are handrails on both sides of the stairs and use them. Fix handrails that are  broken or loose. Make sure that handrails are as long as the stairways. Check any carpeting to make sure that it is firmly attached to the stairs. Fix any carpet that is loose or worn. Avoid having throw rugs at the top or bottom of the stairs. If you do have throw rugs, attach them to the floor with carpet tape. Make sure that you have a light switch at the top of the stairs and the bottom of the stairs. If you do not have them, ask someone to add them for you. What else can I do to help prevent falls? Wear shoes that: Do not have high heels. Have rubber bottoms. Are comfortable and fit you  well. Are closed at the toe. Do not wear sandals. If you use a stepladder: Make sure that it is fully opened. Do not climb a closed stepladder. Make sure that both sides of the stepladder are locked into place. Ask someone to hold it for you, if possible. Clearly mark and make sure that you can see: Any grab bars or handrails. First and last steps. Where the edge of each step is. Use tools that help you move around (mobility aids) if they are needed. These include: Canes. Walkers. Scooters. Crutches. Turn on the lights when you go into a dark area. Replace any light bulbs as soon as they burn out. Set up your furniture so you have a clear path. Avoid moving your furniture around. If any of your floors are uneven, fix them. If there are any pets around you, be aware of where they are. Review your medicines with your doctor. Some medicines can make you feel dizzy. This can increase your chance of falling. Ask your doctor what other things that you can do to help prevent falls. This information is not intended to replace advice given to you by your health care provider. Make sure you discuss any questions you have with your health care provider. Document Released: 08/28/2009 Document Revised: 04/08/2016 Document Reviewed: 12/06/2014 Elsevier Interactive Patient Education  2017 [employer].

## 2021-06-24 DIAGNOSIS — Z23 Encounter for immunization: Secondary | ICD-10-CM | POA: Diagnosis not present

## 2021-07-08 DIAGNOSIS — Z111 Encounter for screening for respiratory tuberculosis: Secondary | ICD-10-CM | POA: Diagnosis not present

## 2021-07-08 DIAGNOSIS — R5383 Other fatigue: Secondary | ICD-10-CM | POA: Diagnosis not present

## 2021-07-08 DIAGNOSIS — Z79899 Other long term (current) drug therapy: Secondary | ICD-10-CM | POA: Diagnosis not present

## 2021-07-08 DIAGNOSIS — M0609 Rheumatoid arthritis without rheumatoid factor, multiple sites: Secondary | ICD-10-CM | POA: Diagnosis not present

## 2021-07-10 ENCOUNTER — Telehealth: Payer: Medicare Other

## 2021-08-04 ENCOUNTER — Telehealth: Payer: Self-pay | Admitting: Internal Medicine

## 2021-08-04 MED ORDER — VALACYCLOVIR HCL 500 MG PO TABS: 500.0000 mg | ORAL_TABLET | Freq: Two times a day (BID) | ORAL | 1 refills | Status: DC

## 2021-08-04 NOTE — Telephone Encounter (Signed)
Cold sores - mouth Valtrex

## 2021-08-05 DIAGNOSIS — M0609 Rheumatoid arthritis without rheumatoid factor, multiple sites: Secondary | ICD-10-CM | POA: Diagnosis not present

## 2021-08-05 DIAGNOSIS — Z79899 Other long term (current) drug therapy: Secondary | ICD-10-CM | POA: Diagnosis not present

## 2021-08-10 ENCOUNTER — Other Ambulatory Visit: Payer: Self-pay | Admitting: Internal Medicine

## 2021-08-10 ENCOUNTER — Other Ambulatory Visit: Payer: Self-pay | Admitting: Cardiology

## 2021-08-11 ENCOUNTER — Ambulatory Visit (INDEPENDENT_AMBULATORY_CARE_PROVIDER_SITE_OTHER): Payer: Medicare Other | Admitting: Pharmacist

## 2021-08-11 ENCOUNTER — Other Ambulatory Visit: Payer: Self-pay

## 2021-08-11 ENCOUNTER — Telehealth: Payer: Self-pay | Admitting: Pharmacist

## 2021-08-11 DIAGNOSIS — E663 Overweight: Secondary | ICD-10-CM | POA: Diagnosis not present

## 2021-08-11 DIAGNOSIS — I10 Essential (primary) hypertension: Secondary | ICD-10-CM | POA: Diagnosis not present

## 2021-08-11 DIAGNOSIS — F4321 Adjustment disorder with depressed mood: Secondary | ICD-10-CM | POA: Diagnosis not present

## 2021-08-11 DIAGNOSIS — I2510 Atherosclerotic heart disease of native coronary artery without angina pectoris: Secondary | ICD-10-CM

## 2021-08-11 DIAGNOSIS — F0280 Dementia in other diseases classified elsewhere without behavioral disturbance: Secondary | ICD-10-CM | POA: Diagnosis not present

## 2021-08-11 DIAGNOSIS — M5030 Other cervical disc degeneration, unspecified cervical region: Secondary | ICD-10-CM | POA: Diagnosis not present

## 2021-08-11 DIAGNOSIS — E782 Mixed hyperlipidemia: Secondary | ICD-10-CM | POA: Diagnosis not present

## 2021-08-11 DIAGNOSIS — I480 Paroxysmal atrial fibrillation: Secondary | ICD-10-CM

## 2021-08-11 DIAGNOSIS — G308 Other Alzheimer's disease: Secondary | ICD-10-CM | POA: Diagnosis not present

## 2021-08-11 DIAGNOSIS — M25562 Pain in left knee: Secondary | ICD-10-CM | POA: Diagnosis not present

## 2021-08-11 DIAGNOSIS — Z6825 Body mass index (BMI) 25.0-25.9, adult: Secondary | ICD-10-CM | POA: Diagnosis not present

## 2021-08-11 DIAGNOSIS — M0609 Rheumatoid arthritis without rheumatoid factor, multiple sites: Secondary | ICD-10-CM | POA: Diagnosis not present

## 2021-08-11 NOTE — Progress Notes (Signed)
Called patient's cardiologists to requests refill of Lisinopril, per representative the patient needs to be seen before refill can be granted. Representative stated that last refill was sent May 16th for 90 days with 1 refill.  Called patient's wife and informed her patient needs an appointment with cardiologists before refill can be granted.  I also called [facility] to see if patient still had refills left, and he had 3, the pharmacists is faxing over all refills left to [place], [place] Pharmacists Assistant [phone]  Time Spent: 60

## 2021-08-11 NOTE — Patient Instructions (Signed)
Visit Information  Phone number for Pharmacist: [phone]   Goals Addressed             This Visit's Progress    Manage My Medicine       Timeframe:  Long-Range Goal Priority:  High Start Date:     03/26/21                        Expected End Date:      08/14/22             Follow Up Date March 2023   - call for medicine refill 2 or 3 days before it runs out - call if I am sick and can't take my medicine - keep a list of all the medicines I take; vitamins and herbals too -Utilize [facility] for medication synchronization, packaging and delivery    Why is this important?   These steps will help you keep on track with your medicines.   Notes:         Care Plan : [facility]CM Pharmacy Care Plan  Updates made by Charlton Haws, RPH since 08/11/2021 12:00 AM     Problem: Hypertension, Hyperlipidemia, Atrial Fibrillation, GERD and Depression, Dementia   Priority: High     Long-Range Goal: Disease management   Start Date: 03/29/2021  Expected End Date: 08/11/2022  This Visit's Progress: On track  Recent Progress: On track  Priority: High  Note:   Current Barriers:  Unable to independently monitor therapeutic efficacy Unable to self administer medications as prescribed  Pharmacist Clinical Goal(s):  Patient will achieve adherence to monitoring guidelines and medication adherence to achieve therapeutic efficacy achieve ability to self administer medications as prescribed through use of pill packs as evidenced by patient report through collaboration with PharmD and provider.   Interventions: 1:1 collaboration with Plotnikov, Evie Lacks, MD regarding development and update of comprehensive plan of care as evidenced by provider attestation and co-signature Inter-disciplinary care team collaboration (see longitudinal plan of care) Comprehensive medication review performed; medication list updated in electronic medical record  Hypertension (BP goal  <140/90) -Controlled - of note patient is taking less metoprolol than prescribed, BP has been generally < 120/80; pt endorses some dizziness upon standing occasionally -Current treatment: Lisinopril 10 mg daily Metoprolol tartrate 25 mg BID - takes 1 HS -Denies hypotensive/hypertensive symptoms -Educated on BP goals; importance of maintaining adequate hydration to prevent orthostasis -Counseled to monitor BP at home daily -Recommended to continue current medication; can consider reducing lisinopril if needed in future  Hyperlipidemia / CAD: (LDL goal < 70) -Not ideally controlled - LDL is above goal but has not been checked since pt has been consistent with Repatha injections (previously had issues with cost - now enrolled in [facility]) -Current treatment: Rosuvastatin 5 mg daily Ezetimibe 10 mg daily Repatha 140 mg q 14 days ([facility] grant) Nitroglycerin 0.4 mg SL prn Aspirin 81 mg daily -Educated on Cholesterol goals; Benefits of statin/ezetimibe/Repatha combo for ASCVD risk reduction; -Recommended to continue current medication; repeat lipid panel  Atrial Fibrillation (Goal: prevent stroke and major bleeding) -Controlled -CHADSVASC: 4 -Current treatment: Rate control: Metoprolol tartrate 25 mg BID - takes 1 HS Anticoagulation: Eliquis 5 mg BID -Counseled on increased risk of stroke due to Afib and benefits of anticoagulation for stroke prevention; bleeding risk associated with Eliquis and importance of self-monitoring for signs/symptoms of bleeding; -Recommended to continue current medication  Depression/Anxiety/Insomnia (Goal: manage symptoms) -Controlled - pt's wife reports she  sees a difference in mood with escitalopram -Current treatment: Escitalopram 20 mg daily Zolpidem 10 mg daily PRN -Connected with PCP for mental health support -Educated on Benefits of medication for symptom control -Recommended to continue current medication  Dementia (Goal: slow  progression) -Controlled - pt's wife is not sure medication is helping -Current treatment  Donepezil 10 mg - 1/2 tab daily -Counseled on medication benefits - delay progression of disease, not improvement -Recommended to continue current medication  Rheumatoid arthritis (Goal: manage symptoms) -Controlled - pt wife reports prednisone Rx from rheumatologist is PRN for flares; pt is not currently taking any -Current treatment  Prednisone 5 mg daily PRN -Recommended to continue current medication PRN  Health Maintenance -Vaccine gaps: Covid booster, Flu -Advised to get vaccines  Patient Goals/Self-Care Activities Patient will:  - take medications as prescribed -focus on medication adherence by pill packs -check blood pressure daily -collaborate with provider on medication access solutions  -Increase fluids to prevent orthostasis       Patient verbalizes understanding of instructions provided today and agrees to view in [place].  Telephone follow up appointment with pharmacy team member scheduled for: 6 months  Charlene Brooke, PharmD, [place], CPP Clinical Pharmacist [facility] [phone]

## 2021-08-11 NOTE — Progress Notes (Addendum)
Chronic Care Management Pharmacy Note  08/11/2021 Name:  [name] MRN:  [id number] DOB:  09-Sep-1933  Summary: -Pt's wife reports pt is doing very well; pill packs [facility] have helped a lot  Recommendations/Changes made from today's visit: -Coordinated with cardiology and patient's previous pharmacy for refills to ensure no interruption in pill pack delivery -No med changes   Subjective: [name] is an 87 y.o. year old male who is a primary patient of Plotnikov, Evie Lacks, MD.  The CCM team was consulted for assistance with disease management and care coordination needs.    Engaged with patient face to face for follow up visit in response to provider referral for pharmacy case management and/or care coordination services.   Consent to Services:  The patient was given information about Chronic Care Management services, agreed to services, and gave verbal consent prior to initiation of services.  Please see initial visit note for detailed documentation.   Patient Care Team: Plotnikov, Evie Lacks, MD as PCP - General Sueanne Margarita, MD as PCP - Cardiology (Cardiology) Thompson Grayer, MD as PCP - Electrophysiology (Cardiology) Irene Shipper, MD (Gastroenterology) Thompson Grayer, MD (Cardiology) Hennie Duos, MD as Consulting Physician (Rheumatology) Alexis Frock, MD as Consulting Physician (Urology) Katy Apo, MD as Consulting Physician (Ophthalmology) Mathis Fare (Dentistry) Tiajuana Amass, MD as Referring Physician (Allergy and Immunology) Cameron Sprang, MD as Consulting Physician (Neurology) Charlton Haws, [facility] as Pharmacist (Pharmacist)   Patient has dementia and is represented by his caregiver and wife in visit today. They recently moved to an independent living community. His wife [name] takes care of his medications and appointments.  Recent office visits: 05/25/21 Dr [name] OV: f/u - advised to avoid driving. No med changes.  03/02/21  Plotnikov (PCP) - Coronary Artery disease. No med changes. F/u 3 months.   10/16/20 Plotnikov (PCP) - F/u Memory Loss. No med changes. F/u 4 months.  Recent consult visits: 07/08/21 Dr Amil Amen (rheumatology): f/u RA.  04/08/21 PA Tillergy (cardiology): f/u CAD, OSA. RTC 6 months.  04/03/21 Dr Delice Lesch (neurology): f/u dementia. Rx'd memantine 10 mg HS.  11/26/20 Beekman (Rheumatology) - Rheumatoid Arthritis  09/19/20 Dr Radford Pax (cardiology): f/u OSA, CAD. Noncompliance with Repatha. Repeat lipid panel, ALT 6 weeks - never done.  Hospital visits: Medication Reconciliation was completed by comparing discharge summary, patient's EMR and Pharmacy list, and upon discussion with patient.   Admitted to the ED on 02/15/21 due to Syncope. Discharge date was 02/15/21. Discharged from  [facility] ED.   -likely orthostasis   Medications that remain the same after Hospital Discharge:??  -All other medications will remain the same.  Objective:  Lab Results  Component Value Date   CREATININE 0.82 02/15/2021   BUN 15 02/15/2021   GFR 82.58 04/06/2019   GFRNONAA >60 02/15/2021   GFRAA >60 03/14/2020   NA 138 02/15/2021   K 3.9 02/15/2021   CALCIUM 9.2 02/15/2021   CO2 25 02/15/2021   GLUCOSE 93 02/15/2021    Lab Results  Component Value Date/Time   HGBA1C 6.4 08/30/2017 07:24 AM   HGBA1C 6.8 (H) 06/09/2017 11:55 AM   GFR 82.58 04/06/2019 11:58 AM   GFR 79.61 02/07/2018 08:39 AM    Last diabetic Eye exam: No results found for: HMDIABEYEEXA  Last diabetic Foot exam: No results found for: HMDIABFOOTEX   Lab Results  Component Value Date   CHOL 197 08/04/2020   HDL 47 08/04/2020   LDLCALC 106 (H)  08/04/2020   LDLDIRECT 168.9 05/24/2013   TRIG 254 (H) 08/04/2020   CHOLHDL 4.2 08/04/2020    Hepatic Function Latest Ref Rng & Units 02/15/2021 08/31/2020 03/03/2020  Total Protein 6.5 - 8.1 g/dL 7.3 6.8 7.0  Albumin 3.5 - 5.0 g/dL 3.7 3.7 4.0  AST 15 - 41 U/L '21 22 13  ' ALT 0 - 44 U/L '13 14 11   ' Alk Phosphatase 38 - 126 U/L 59 54 69  Total Bilirubin 0.3 - 1.2 mg/dL 0.8 1.0 0.4  Bilirubin, Direct 0.0 - 0.2 mg/dL - 0.1 0.10    Lab Results  Component Value Date/Time   TSH 1.18 12/13/2017 08:49 AM   TSH 1.015 10/07/2015 09:19 AM    CBC Latest Ref Rng & Units 02/15/2021 08/31/2020 03/14/2020  WBC 4.0 - 10.5 K/uL 6.7 7.6 8.6  Hemoglobin 13.0 - 17.0 g/dL 13.2 14.1 13.8  Hematocrit 39.0 - 52.0 % 39.9 43.4 42.3  Platelets 150 - 400 K/uL 304 302 299    Lab Results  Component Value Date/Time   VD25OH 48 04/13/2012 07:38 AM   CHA2DS2-VASc Score = 4  The patient's score is based upon: CHF History: 0 HTN History: 1 Diabetes History: 0 Stroke History: 0 Vascular Disease History: 1 Age Score: 2 Gender Score: 0     Clinical ASCVD: Yes  The ASCVD Risk score (Arnett DK, et al., 2019) failed to calculate for the following reasons:   The 2019 ASCVD risk score is only valid for ages 38 to 61   The patient has a prior MI or stroke diagnosis    Depression screen [facility] 2/9 06/17/2021 06/11/2020 11/23/2018  Decreased Interest 0 0 0  Down, Depressed, Hopeless 0 0 0  PHQ - 2 Score 0 0 0  Altered sleeping - - -  Tired, decreased energy - - -  Change in appetite - - -  Feeling bad or failure about yourself  - - -  Trouble concentrating - - -  Moving slowly or fidgety/restless - - -  Suicidal thoughts - - -  PHQ-9 Score - - -  Difficult doing work/chores - - -  Some recent data might be hidden      Social History   Tobacco Use  Smoking Status Former   Years: 10.00   Types: Cigarettes   Quit date: 08/09/1968   Years since quitting: 53.0  Smokeless Tobacco Never   BP Readings from Last 3 Encounters:  06/17/21 118/70  05/25/21 130/78  04/08/21 122/60   Pulse Readings from Last 3 Encounters:  06/17/21 69  05/25/21 62  04/08/21 (!) 54   Wt Readings from Last 3 Encounters:  06/17/21 184 lb 12.8 oz (83.8 kg)  05/25/21 182 lb 6.4 oz (82.7 kg)  04/08/21 180 lb 12.8 oz (82 kg)    BMI Readings from Last 3 Encounters:  06/17/21 25.06 kg/m  05/25/21 24.74 kg/m  04/08/21 24.52 kg/m    Assessment/Interventions: Review of patient past medical history, allergies, medications, health status, including review of consultants reports, laboratory and other test data, was performed as part of comprehensive evaluation and provision of chronic care management services.   SDOH:  (Social Determinants of Health) assessments and interventions performed: Yes  SDOH Screenings   Alcohol Screen: Low Risk    Last Alcohol Screening Score (AUDIT): 3  Depression (PHQ2-9): Low Risk    PHQ-2 Score: 0  Financial Resource Strain: Low Risk    Difficulty of Paying Living Expenses: Not hard at all  Food Insecurity: No  Food Insecurity   Worried About Charity fundraiser in the Last Year: Never true   Ran Out of Food in the Last Year: Never true  Housing: Low Risk    Last Housing Risk Score: 0  Physical Activity: Sufficiently Active   Days of Exercise per Week: 5 days   Minutes of Exercise per Session: 30 min  Social Connections: Engineer, building services of Communication with Friends and Family: More than three times a week   Frequency of Social Gatherings with Friends and Family: More than three times a week   Attends Religious Services: More than 4 times per year   Active Member of [employer] or Organizations: Yes   Attends Music therapist: More than 4 times per year   Marital Status: Married  Stress: No Stress Concern Present   Feeling of Stress : Not at all  Tobacco Use: Medium Risk   Smoking Tobacco Use: Former   Smokeless Tobacco Use: Never  Transportation Needs: No Data processing manager (Medical): No   Lack of Transportation (Non-Medical): No    CCM Care Plan  Allergies  Allergen Reactions   Methotrexate Other (See Comments)    REACTION: tachycardia   Aricept [Donepezil Hcl]     nightmares    Crestor [Rosuvastatin Calcium]      Myalgias with 54m dose   Lisinopril Other (See Comments)    cough   Namenda [Memantine]     n/d   Atorvastatin Other (See Comments)    Muscle pain, leg cramps   Pravastatin Sodium Other (See Comments)    REACTION: cramps, fatigue    Medications Reviewed Today     Reviewed by FCharlton Haws [facility](Ph[facility]ist) on 08/11/21 at 1253  Med List Status: <None>   Medication Order Taking? Sig Documenting Provider Last Dose Status Informant  acetaminophen (TYLENOL) 500 MG tablet [id number] Take 1,000 mg by mouth every 6 (six) hours as needed. [provider] Taking Active Self  acidophilus (RISAQUAD) CAPS capsule [id number] Take 1 capsule by mouth daily. [provider] Taking Active   apixaban (ELIQUIS) 5 MG TABS tablet [id number] Take 1 tablet (5 mg total) by mouth 2 (two) times daily. TSueanne Margarita MD Taking Active   aspirin EC 81 MG tablet [id number] Take 81 mg by mouth daily. [provider] Taking Active Self  donepezil (ARICEPT) 10 MG tablet [id number] Take 1/2 tablet daily ACameron Sprang MD Taking Active   escitalopram (LEXAPRO) 20 MG tablet [id number] Take 1 tablet daily Plotnikov, AEvie Lacks MD Taking Active   Evolocumab (REPATHA SURECLICK) 1915MG/ML SDarden [id number] INJECT 1 PEN INTO SKIN EVERY 14 DAYS Turner, TEber Hong MD Taking Active   ezetimibe (ZETIA) 10 MG tablet [id number] Take 0.5 tablets (5 mg total) by mouth daily. Plotnikov, AEvie Lacks MD Taking Active   lisinopril (ZESTRIL) 10 MG tablet [id number] Take 1 tablet (10 mg total) by mouth daily. TSueanne Margarita MD Taking Active   memantine ([facility] 10 MG tablet [id number] Take 1 tablet (10 mg total) by mouth at bedtime. ACameron Sprang MD Taking Active   metoprolol tartrate (LOPRESSOR) 25 MG tablet [id number] Take 1 tablet (25 mg total) by mouth 2 (two) times daily.  Patient taking differently: Take 25 mg by mouth at bedtime.   AThompson Grayer MD Taking  Active   nitroGLYCERIN (NITROSTAT) 0.4 MG SL tablet [id number] ONE TABLET UNDER TONGUE  AS NEEDED FOR CHEST PAIN. MAY REPEAT IN 5 MINUTES AND AGAIN IN 5 MINUTES (TOTAL OF 3 TABLETS) Allred, James, MD Taking Active Self  omeprazole (PRILOSEC) 40 MG capsule [id number] Yes TAKE ONE CAPSULE EACH DAY Plotnikov, Evie Lacks, MD Taking Active   oxyCODONE-acetaminophen (PERCOCET/ROXICET) 5-325 MG tablet [id number] Yes TAKE ONE TABLET EVERY EIGHT HOURS AS NEEDED FOR SEVERE PAIN Plotnikov, Evie Lacks, MD Taking Active   Propylene Glycol (SYSTANE BALANCE OP) [id number] Yes Place 2 drops into both eyes daily as needed (dry eyes).  [provider] Taking Active Self  rosuvastatin (CRESTOR) 5 MG tablet [id number] Yes Take 1 tablet (5 mg total) by mouth daily. Sueanne Margarita, MD Taking Active   valACYclovir (VALTREX) 500 MG tablet [id number] Yes Take 1 tablet (500 mg total) by mouth 2 (two) times daily. Plotnikov, Evie Lacks, MD Taking Active   zolpidem (AMBIEN) 10 MG tablet [id number] Yes TAKE 1/2 TO 1 TABLET AT BEDTIME AS NEEDED FOR SLEEP Plotnikov, Evie Lacks, MD Taking Active             Patient Active Problem List   Diagnosis Date Noted   Atherosclerosis of aorta ([place]) 03/02/2021   COVID-19 virus infection 11/15/2020   Weight loss 09/29/2020   Near syncope 09/04/2020   Chest pain    Dyspepsia    Encounter for medication management 11/14/2019   Angular stomatitis 07/09/2019   Cervical radiculopathy 06/05/2019   Pain in joint of left shoulder 05/31/2019   Concussion 04/17/2019   Hemoptysis 04/05/2019   Confusion 04/05/2019   Alzheimer's dementia ([place]) 12/21/2018   Nausea & vomiting 02/07/2018   Chills 02/07/2018   Rash 02/07/2018   Insomnia 09/22/2017   Cervical spondylolysis 06/28/2017   Hyperglycemia 06/22/2017   Well adult exam 11/18/2016   Hydronephrosis 11/18/2016   UPJ obstruction, 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   Allergic rhinitis 06/02/2016   Asthma with exacerbation 06/02/2016    Sinusitis, chronic 10/17/2015   OSA (obstructive sleep apnea) 06/19/2015   Cellulitis and abscess of leg, except foot 05/26/2015   Paroxysmal atrial fibrillation ([place]) 02/21/2015   CAP (community acquired pneumonia) 01/02/2015   Upper respiratory infection 12/02/2014   Vivid dream 09/25/2014   Actinic keratoses 04/26/2014   CAD (coronary artery disease) 02/25/2014   STEMI (ST elevation myocardial infarction) ([place]) 01/08/2014   Rheumatoid bursitis of left elbow ([place]) 08/08/2013   Situational depression 06/18/2013   Neck pain 04/23/2012   Acute abdominal pain in left flank 02/14/2012   Hypertension 09/23/2011   Chest pain, midsternal 09/13/2011   Mixed hyperlipidemia 07/23/2010   Palpitations 07/07/2010   Rheumatoid arthritis ([place]) 06/04/2010   PARESTHESIA 06/04/2010   Pain in joint 11/27/2009   MYALGIA 08/21/2009   ALLERGIC RHINITIS 06/11/2008   OSTEOARTHRITIS 06/11/2008   FATIGUE 06/11/2008   LOW BACK PAIN 07/27/2007   COLONIC POLYPS, HX OF 07/27/2007    Immunization History  Administered Date(s) Administered   Fluad Quad(high Dose 65+) 07/09/2019, 08/15/2020   Influenza Split 08/10/2011, 08/15/2012   Influenza Whole 08/21/2009, 09/22/2010   Influenza, High Dose Seasonal PF 09/16/2015, 08/18/2016, 08/31/2017, 08/14/2018   Influenza,inj,Quad PF,6+ Mos 08/06/2013   Influenza-Unspecified 08/31/2017   PFIZER(Purple Top)SARS-COV-2 Vaccination 12/11/2019, 01/01/2020, 08/12/2020   Pneumococcal Conjugate-13 10/02/2013   Pneumococcal Polysaccharide-23 06/04/2010   Tdap 10/22/2011   Zoster Recombinat (Shingrix) 03/09/2017, 05/10/2017    Conditions to be addressed/monitored:  Hypertension, Hyperlipidemia, Atrial Fibrillation, GERD and Depression, Dementia  Care Plan : [place]  Updates made by Charlton Haws, [place] since 08/11/2021 12:00  AM     Problem: Hypertension, Hyperlipidemia, Atrial Fibrillation, GERD and Depression, Dementia   Priority: High      Long-Range Goal: Disease management   Start Date: 03/29/2021  Expected End Date: 08/11/2022  This Visit's Progress: On track  Recent Progress: On track  Priority: High  Note:   Current Barriers:  Unable to independently monitor therapeutic efficacy Unable to self administer medications as prescribed  Pharmacist Clinical Goal(s):  Patient will achieve adherence to monitoring guidelines and medication adherence to achieve therapeutic efficacy achieve ability to self administer medications as prescribed through use of pill packs as evidenced by patient report through collaboration with PharmD and provider.   Interventions: 1:1 collaboration with Plotnikov, Evie Lacks, MD regarding development and update of comprehensive plan of care as evidenced by provider attestation and co-signature Inter-disciplinary care team collaboration (see longitudinal plan of care) Comprehensive medication review performed; medication list updated in electronic medical record  Hypertension (BP goal <140/90) -Controlled - of note patient is taking less metoprolol than prescribed, BP has been generally < 120/80; pt endorses some dizziness upon standing occasionally -Current treatment: Lisinopril 10 mg daily Metoprolol tartrate 25 mg BID - takes 1 HS -Denies hypotensive/hypertensive symptoms -Educated on BP goals; importance of maintaining adequate hydration to prevent orthostasis -Counseled to monitor BP at home daily -Recommended to continue current medication; can consider reducing lisinopril if needed in future  Hyperlipidemia / CAD: (LDL goal < 70) -Not ideally controlled - LDL is above goal but has not been checked since pt has been consistent with Repatha injections (previously had issues with cost - now enrolled in [employer]) -Current treatment: Rosuvastatin 5 mg daily Ezetimibe 10 mg daily Repatha 140 mg q 14 days ([employer] grant) Nitroglycerin 0.4 mg SL prn Aspirin 81 mg daily -Educated on  Cholesterol goals; Benefits of statin/ezetimibe/Repatha combo for ASCVD risk reduction; -Recommended to continue current medication; repeat lipid panel  Atrial Fibrillation (Goal: prevent stroke and major bleeding) -Controlled -CHADSVASC: 4 -Current treatment: Rate control: Metoprolol tartrate 25 mg BID - takes 1 HS Anticoagulation: Eliquis 5 mg BID -Counseled on increased risk of stroke due to Afib and benefits of anticoagulation for stroke prevention; bleeding risk associated with Eliquis and importance of self-monitoring for signs/symptoms of bleeding; -Recommended to continue current medication  Depression/Anxiety/Insomnia (Goal: manage symptoms) -Controlled - pt's wife reports she sees a difference in mood with escitalopram -Current treatment: Escitalopram 20 mg daily Zolpidem 10 mg daily PRN -Connected with PCP for mental health support -Educated on Benefits of medication for symptom control -Recommended to continue current medication  Dementia (Goal: slow progression) -Controlled - pt's wife is not sure medication is helping -Current treatment  Donepezil 10 mg - 1/2 tab daily -Counseled on medication benefits - delay progression of disease, not improvement -Recommended to continue current medication  Rheumatoid arthritis (Goal: manage symptoms) -Controlled - pt wife reports prednisone Rx from rheumatologist is PRN for flares; pt is not currently taking any -Current treatment  Prednisone 5 mg daily PRN -Recommended to continue current medication PRN  Health Maintenance -Vaccine gaps: Covid booster, Flu -Advised to get vaccines  Patient Goals/Self-Care Activities Patient will:  - take medications as prescribed -focus on medication adherence by pill packs -check blood pressure daily -collaborate with provider on medication access solutions  -Increase fluids to prevent orthostasis        Compliance/Adherence/Medication fill history: Care  Gaps: None  Star-Rating Drugs: Rosuvastatin - LF 05/22/21 x 90 ds Lisinopril - LF 05/22/21 x 90 ds  Medication  Assistance:  [place] through 02/26/2022  Patient's preferred pharmacy is:  [facility] - [place], [place] - [address] [place] [address] Phone: [phone] Fax: [phone]  Uses pill box? Yes Pt endorses 100% compliance  We discussed: Reviewed patient's [facility] medication and Epic medication profile assuring there are no discrepancies or gaps in therapy. Confirmed all fill dates appropriate and verified with patient that there is a sufficient quantity of all prescribed medications at home. Informed patient to call me any time if needing medications before scheduled deliveries.  -Pharmacy is need of 90-ds of lisinopril, they only have 28 ds on hand; pt's next 90-day pill pack will be due 08/22/21. Cardiology has denied refill request. Coordinated with [facility] so lisinopril can continue in pill packs  Patient decided to: Utilize [facility] for medication synchronization, packaging and delivery  Care Plan and Follow Up Patient Decision:  Patient agrees to Care Plan and Follow-up.  Plan: Telephone follow up appointment with care management team member scheduled for:  6 months  Charlene Brooke, PharmD, [place], CPP Clinical Pharmacist [facility] [phone]  Medical screening examination/treatment/procedure(s) were performed by non-physician practitioner and as supervising physician I was immediately available for consultation/collaboration.  I agree with above. Lew Dawes, MD

## 2021-08-18 ENCOUNTER — Telehealth: Payer: Self-pay | Admitting: Pharmacist

## 2021-08-18 DIAGNOSIS — Z85828 Personal history of other malignant neoplasm of skin: Secondary | ICD-10-CM | POA: Diagnosis not present

## 2021-08-18 DIAGNOSIS — L812 Freckles: Secondary | ICD-10-CM | POA: Diagnosis not present

## 2021-08-18 DIAGNOSIS — D1801 Hemangioma of skin and subcutaneous tissue: Secondary | ICD-10-CM | POA: Diagnosis not present

## 2021-08-18 DIAGNOSIS — L111 Transient acantholytic dermatosis [Grover]: Secondary | ICD-10-CM | POA: Diagnosis not present

## 2021-08-18 DIAGNOSIS — L570 Actinic keratosis: Secondary | ICD-10-CM | POA: Diagnosis not present

## 2021-08-18 DIAGNOSIS — L821 Other seborrheic keratosis: Secondary | ICD-10-CM | POA: Diagnosis not present

## 2021-08-18 NOTE — Progress Notes (Signed)
Chronic Care Management Pharmacy Assistant   Name: [name]  MRN: [id number] DOB: 1933-06-21  Reason for Encounter: Medication Coordination Call  Recent office visits:  None  Recent consult visits:  None  Hospital visits:  None in previous 6 months  Medications: Outpatient Encounter Medications as of 08/18/2021  Medication Sig   acetaminophen (TYLENOL) 500 MG tablet Take 1,000 mg by mouth every 6 (six) hours as needed.   acidophilus (RISAQUAD) CAPS capsule Take 1 capsule by mouth daily.   apixaban (ELIQUIS) 5 MG TABS tablet Take 1 tablet (5 mg total) by mouth 2 (two) times daily.   aspirin EC 81 MG tablet Take 81 mg by mouth daily.   donepezil (ARICEPT) 10 MG tablet Take 1/2 tablet daily   escitalopram (LEXAPRO) 20 MG tablet Take 1 tablet daily   Evolocumab (REPATHA SURECLICK) 016 MG/ML SOAJ INJECT 1 PEN INTO SKIN EVERY 14 DAYS   ezetimibe (ZETIA) 10 MG tablet Take 0.5 tablets (5 mg total) by mouth daily.   lisinopril (ZESTRIL) 10 MG tablet Take 1 tablet (10 mg total) by mouth daily.   memantine (NAMENDA) 10 MG tablet Take 1 tablet (10 mg total) by mouth at bedtime.   metoprolol tartrate (LOPRESSOR) 25 MG tablet Take 1 tablet (25 mg total) by mouth 2 (two) times daily. (Patient taking differently: Take 25 mg by mouth at bedtime.)   nitroGLYCERIN (NITROSTAT) 0.4 MG SL tablet ONE TABLET UNDER TONGUE AS NEEDED FOR CHEST PAIN. MAY REPEAT IN 5 MINUTES AND AGAIN IN 5 MINUTES (TOTAL OF 3 TABLETS)   omeprazole (PRILOSEC) 40 MG capsule TAKE ONE CAPSULE EACH DAY   oxyCODONE-acetaminophen (PERCOCET/ROXICET) 5-325 MG tablet TAKE ONE TABLET EVERY EIGHT HOURS AS NEEDED FOR SEVERE PAIN   Propylene Glycol (SYSTANE BALANCE OP) Place 2 drops into both eyes daily as needed (dry eyes).    rosuvastatin (CRESTOR) 5 MG tablet Take 1 tablet (5 mg total) by mouth daily.   valACYclovir (VALTREX) 500 MG tablet Take 1 tablet (500 mg total) by mouth 2 (two) times daily.   zolpidem (AMBIEN) 10 MG tablet  TAKE 1/2 TO 1 TABLET AT BEDTIME AS NEEDED FOR SLEEP   No facility-administered encounter medications on file as of 08/18/2021.    Reviewed chart for medication changes ahead of medication coordination call.  No OVs, Consults, or hospital visits since last care coordination call/Pharmacist visit. (If appropriate, list visit date, provider name)  No medication changes indicated OR if recent visit, treatment plan here.  BP Readings from Last 3 Encounters:  06/17/21 118/70  05/25/21 130/78  04/08/21 122/60    Lab Results  Component Value Date   HGBA1C 6.4 08/30/2017     Patient obtains medications through Adherence Packaging  90 Days   Patient is due for next adherence delivery on: 08/27/21. Called patient and reviewed medications and coordinated delivery.  This delivery to include: Lisinopril 10 mg 1 tab at breakfast Omeprazole 40 mg 1 tab at breakfast Esitalopram 20 mg 1 tab at breakfast Metoprolol Tartrate 25 mg 1 tab at breakfast Donepezil 10 mg 0.5 tab at breakfast Memantine 10 mg 0.5 tab at bedtime Ezetimibe 10 mg 0.5 tab at breakfast Eliquis 5 mg 1 tab at breakfast, bedtime Rosuvastatin 5 mg 1 tab at breakfast  Patient declined the following medications (meds) due to (reason) Zoipidem Repatha  Confirmed delivery date of 08/27/21, advised patient that pharmacy will contact them the morning of delivery.   Star Rating Drugs: Lisinopril - last fill 05/22/21  90D  Rosuvastatin - last fill 05/22/21 [place], [place] Clinical Pharmacists Assistant [phone]

## 2021-08-31 ENCOUNTER — Emergency Department (HOSPITAL_BASED_OUTPATIENT_CLINIC_OR_DEPARTMENT_OTHER)
Admission: EM | Admit: 2021-08-31 | Discharge: 2021-08-31 | Disposition: A | Discharge status: Home / Self Care | Payer: Medicare Other | Attending: Emergency Medicine | Admitting: Emergency Medicine

## 2021-08-31 ENCOUNTER — Other Ambulatory Visit: Payer: Self-pay

## 2021-08-31 ENCOUNTER — Encounter (HOSPITAL_BASED_OUTPATIENT_CLINIC_OR_DEPARTMENT_OTHER): Payer: Self-pay | Admitting: Emergency Medicine

## 2021-08-31 DIAGNOSIS — R42 Dizziness and giddiness: Secondary | ICD-10-CM | POA: Insufficient documentation

## 2021-08-31 DIAGNOSIS — Z5321 Procedure and treatment not carried out due to patient leaving prior to being seen by health care provider: Secondary | ICD-10-CM | POA: Insufficient documentation

## 2021-08-31 NOTE — ED Notes (Signed)
Pt called for in [place] x3. No answer.

## 2021-08-31 NOTE — ED Triage Notes (Signed)
At approx 1200 Pt  became dizzy and feel while walking the dog. Unwitnessed fall. Pt denies LOC and N/V.  Pt denis hitting head.  Pt is on Elquis. Pt stated  he fell on his right side.

## 2021-08-31 NOTE — ED Notes (Addendum)
Noted in error

## 2021-09-03 DIAGNOSIS — M0609 Rheumatoid arthritis without rheumatoid factor, multiple sites: Secondary | ICD-10-CM | POA: Diagnosis not present

## 2021-09-16 ENCOUNTER — Other Ambulatory Visit: Payer: Self-pay

## 2021-09-16 ENCOUNTER — Encounter: Payer: Self-pay | Admitting: Internal Medicine

## 2021-09-16 ENCOUNTER — Ambulatory Visit (INDEPENDENT_AMBULATORY_CARE_PROVIDER_SITE_OTHER): Payer: Medicare Other | Admitting: Internal Medicine

## 2021-09-16 VITALS — BP 138/72 | HR 68 | Temp 98.7°F | Ht 72.0 in | Wt 180.6 lb

## 2021-09-16 DIAGNOSIS — M069 Rheumatoid arthritis, unspecified: Secondary | ICD-10-CM

## 2021-09-16 DIAGNOSIS — F02B3 Dementia in other diseases classified elsewhere, moderate, with mood disturbance: Secondary | ICD-10-CM

## 2021-09-16 DIAGNOSIS — G308 Other Alzheimer's disease: Secondary | ICD-10-CM | POA: Diagnosis not present

## 2021-09-16 DIAGNOSIS — M5440 Lumbago with sciatica, unspecified side: Secondary | ICD-10-CM | POA: Diagnosis not present

## 2021-09-16 DIAGNOSIS — I2510 Atherosclerotic heart disease of native coronary artery without angina pectoris: Secondary | ICD-10-CM | POA: Diagnosis not present

## 2021-09-16 DIAGNOSIS — Z23 Encounter for immunization: Secondary | ICD-10-CM | POA: Diagnosis not present

## 2021-09-16 NOTE — Progress Notes (Signed)
Subjective:  Patient ID: [name], male    DOB: 03/04/1933  Age: 87 y.o. MRN: [id number]  CC: Follow-up (Flu shot)   HPI  [name] presents for dementia, RA, HTN, LBP f/u   Outpatient Medications Prior to Visit  Medication Sig Dispense Refill   acetaminophen (TYLENOL) 500 MG tablet Take 1,000 mg by mouth every 6 (six) hours as needed.     acidophilus (RISAQUAD) CAPS capsule Take 1 capsule by mouth daily.     apixaban (ELIQUIS) 5 MG TABS tablet Take 1 tablet (5 mg total) by mouth 2 (two) times daily. 180 tablet 1   aspirin EC 81 MG tablet Take 81 mg by mouth daily.     donepezil (ARICEPT) 10 MG tablet Take 1/2 tablet daily 45 tablet 3   escitalopram (LEXAPRO) 20 MG tablet Take 1 tablet daily 90 tablet 3   Evolocumab (REPATHA SURECLICK) 675 MG/ML SOAJ INJECT 1 PEN INTO SKIN EVERY 14 DAYS 2 mL 11   ezetimibe (ZETIA) 10 MG tablet Take 0.5 tablets (5 mg total) by mouth daily. 45 tablet 3   lisinopril (ZESTRIL) 10 MG tablet Take 1 tablet (10 mg total) by mouth daily. 90 tablet 1   memantine (NAMENDA) 10 MG tablet Take 1 tablet (10 mg total) by mouth at bedtime. 90 tablet 3   metoprolol tartrate (LOPRESSOR) 25 MG tablet Take 1 tablet (25 mg total) by mouth 2 (two) times daily. (Patient taking differently: Take 25 mg by mouth at bedtime.) 180 tablet 3   nitroGLYCERIN (NITROSTAT) 0.4 MG SL tablet ONE TABLET UNDER TONGUE AS NEEDED FOR CHEST PAIN. MAY REPEAT IN 5 MINUTES AND AGAIN IN 5 MINUTES (TOTAL OF 3 TABLETS) 25 tablet 6   omeprazole (PRILOSEC) 40 MG capsule TAKE ONE CAPSULE EACH DAY 90 capsule 3   oxyCODONE-acetaminophen (PERCOCET/ROXICET) 5-325 MG tablet TAKE ONE TABLET EVERY EIGHT HOURS AS NEEDED FOR SEVERE PAIN 90 tablet 0   Propylene Glycol (SYSTANE BALANCE OP) Place 2 drops into both eyes daily as needed (dry eyes).      rosuvastatin (CRESTOR) 5 MG tablet Take 1 tablet (5 mg total) by mouth daily. 90 tablet 1   valACYclovir (VALTREX) 500 MG tablet Take 1 tablet (500 mg total)  by mouth 2 (two) times daily. 14 tablet 1   zolpidem (AMBIEN) 10 MG tablet TAKE 1/2 TO 1 TABLET AT BEDTIME AS NEEDED FOR SLEEP 30 tablet 3   No facility-administered medications prior to visit.    ROS: Review of Systems  Constitutional:  Negative for appetite change, fatigue and unexpected weight change.  HENT:  Negative for congestion, nosebleeds, sneezing, sore throat and trouble swallowing.   Eyes:  Negative for itching and visual disturbance.  Respiratory:  Negative for cough.   Cardiovascular:  Negative for chest pain, palpitations and leg swelling.  Gastrointestinal:  Negative for abdominal distention, blood in stool, diarrhea and nausea.  Genitourinary:  Negative for frequency and hematuria.  Musculoskeletal:  Positive for arthralgias and back pain. Negative for gait problem, joint swelling and neck pain.  Skin:  Negative for rash.  Neurological:  Negative for dizziness, tremors, speech difficulty and weakness.  Psychiatric/Behavioral:  Positive for decreased concentration. Negative for agitation, dysphoric mood, sleep disturbance and suicidal ideas. The patient is nervous/anxious.    Objective:  BP 138/72 (BP Location: Left Arm)   Pulse 68   Temp 98.7 F (37.1 C) (Oral)   Ht 6' (1.829 m)   Wt 180 lb 9.6 oz (81.9 kg)  SpO2 97%   BMI 24.49 kg/m   BP Readings from Last 3 Encounters:  09/16/21 138/72  08/31/21 (!) 150/65  06/17/21 118/70    Wt Readings from Last 3 Encounters:  09/16/21 180 lb 9.6 oz (81.9 kg)  08/31/21 180 lb (81.6 kg)  06/17/21 184 lb 12.8 oz (83.8 kg)    Physical Exam Constitutional:      General: He is not in acute distress.    Appearance: He is well-developed.     Comments: NAD  Eyes:     Conjunctiva/sclera: Conjunctivae normal.     Pupils: Pupils are equal, round, and reactive to light.  Neck:     Thyroid: No thyromegaly.     Vascular: No JVD.  Cardiovascular:     Rate and Rhythm: Normal rate and regular rhythm.     Heart sounds:  Normal heart sounds. No murmur heard.   No friction rub. No gallop.  Pulmonary:     Effort: Pulmonary effort is normal. No respiratory distress.     Breath sounds: Normal breath sounds. No wheezing or rales.  Chest:     Chest wall: No tenderness.  Abdominal:     General: Bowel sounds are normal. There is no distension.     Palpations: Abdomen is soft. There is no mass.     Tenderness: There is no abdominal tenderness. There is no guarding or rebound.  Musculoskeletal:        General: No tenderness. Normal range of motion.     Cervical back: Normal range of motion.  Lymphadenopathy:     Cervical: No cervical adenopathy.  Skin:    General: Skin is warm and dry.     Findings: No rash.  Neurological:     Mental Status: He is alert and oriented to person, place, and time.     Cranial Nerves: No cranial nerve deficit.     Motor: No abnormal muscle tone.     Coordination: Coordination normal.     Gait: Gait normal.     Deep Tendon Reflexes: Reflexes are normal and symmetric.  Psychiatric:        Behavior: Behavior normal.        Thought Content: Thought content normal.        Judgment: Judgment normal.    Lab Results  Component Value Date   WBC 6.7 02/15/2021   HGB 13.2 02/15/2021   HCT 39.9 02/15/2021   PLT 304 02/15/2021   GLUCOSE 93 02/15/2021   CHOL 197 08/04/2020   TRIG 254 (H) 08/04/2020   HDL 47 08/04/2020   LDLDIRECT 168.9 05/24/2013   LDLCALC 106 (H) 08/04/2020   ALT 13 02/15/2021   AST 21 02/15/2021   NA 138 02/15/2021   K 3.9 02/15/2021   CL 108 02/15/2021   CREATININE 0.82 02/15/2021   BUN 15 02/15/2021   CO2 25 02/15/2021   TSH 1.18 12/13/2017   PSA 2.46 02/21/2017   INR 0.92 09/13/2016   HGBA1C 6.4 08/30/2017    No results found.  Assessment & Plan:   Problem List Items Addressed This Visit     Alzheimer's dementia ([place])    On  Donepezil - will continue       LOW BACK PAIN    Cont on Percocet prn  Potential benefits of a long term opioids  use as well as potential risks (i.e. addiction risk, apnea etc) and complications (i.e. Somnolence, constipation and others) were explained to the patient and were aknowledged.  Rheumatoid arthritis ([facility])    Cont on Percocet prn  Potential benefits of a long term opioids use as well as potential risks (i.e. addiction risk, apnea etc) and complications (i.e. Somnolence, constipation and others) were explained to the patient and were aknowledged.      Other Visit Diagnoses     Needs flu shot    -  Primary   Relevant Orders   Flu Vaccine QUAD High Dose(Fluad) (Completed)         No orders of the defined types were placed in this encounter.     Follow-up: Return in about 3 months (around 12/17/2021) for a follow-up visit.  [name], MD

## 2021-09-16 NOTE — Assessment & Plan Note (Signed)
Cont on Percocet prn  Potential benefits of a long term opioids use as well as potential risks (i.e. addiction risk, apnea etc) and complications (i.e. Somnolence, constipation and others) were explained to the patient and were aknowledged. 

## 2021-09-16 NOTE — Assessment & Plan Note (Signed)
On  Donepezil - will continue

## 2021-10-04 NOTE — Progress Notes (Signed)
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  He is on donepezil 5 mg daily, memantine 10 mg daily, unable to increase the dose to the side effects.  Recommendations:  Discussed the diagnosis of dementia, likely due to Alzheimer's disease. Discussed safety both in and out of the home.  Discussed the importance of regular daily schedule to maintain brain function.  Continue to monitor mood with Lexapro 20 mg daily by PCP Stay active exercising  at least 30 minutes at least 3 times a week.  Continue Donepezil 5 mg daily, side effects discussed Continue Memantine 10 mg daily, side effects discussed Follow up in 6  months.   Case discussed with Dr. Delice Lesch who agrees with the plan     Subjective:          [name] is a pleasant 87 year old right-handed man with a history of hypertension, hyperlipidemia, RA, OSA on CPAP, CAD, and most recently COVID virus in January 2022, was seen today in follow up for memory loss.  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  He is accompanied in the office by his wife, who supplements the history.  Previous records as well as any outside records available were reviewed prior to today's visit.  He is currently on donepezil 5 mg daily (he could not tolerate the full dose) as well as  memantine 10 mg daily also due to side effects.  Today, the patient reports that "the memory is a little less than before" with worsening storage, difficulties with visual object naming, and review semantic fluency due to Alzheimer's disease.  He also has issues with processing speed and executive function, life play of vascular etiology. He lives at [facility], enjoying wood working classes chair fit classes  with his wife 3  times a week, " but spends a little more time in bed in the morning and in the afternoon flirts with the girls"-his wife says.  He used to enjoy reading more, now he gets frustrated, as during his last visit.  However, since moving to the community, he feels very safe.  He sleeps "a little bit more especially naps and gets up a little bit later missing some of the exercise classes ".  He takes Ambien and melatonin for sleeping denies sleepwalking, hallucinations or paranoia.  She feels that there might be a depression component to it.   His wife administers the medications, manages finances and drives.  He feels very frustrated of not being able to drive.  He denies any headaches, occasionally he has episodes of dizziness perhaps "because he doesn't like to drink enough water ", denies focal numbness, tingling, or weakness.  He had 1 episode of fall without hitting his head or loss of consciousness, felt to be due to significant dehydration on exam or day.  He walks without assistance.  He denies getting lost.  History on Initial Assessment 04/11/2019: This is an 87 year old right-handed man with a history of hypertension, hyperlipidemia, OSA on CPAP, CAD, presenting for evaluation of worsening memory. He feels his memory is okay, he gets confused some, "my wife tells me I forget things." He makes jokes several times during the visit. His wife started noticing changes the beginning of the year. He  started having personality changes where he would just explode for no reason. He was started on Lexapro which has significantly helped. She has noticed short term memory issues, and was missing bills that she had to take over in January. He was also starting to miss medications, his wife has started helping. Last night he was very concerned that he had not taken his medications. He has gotten lost driving, last week he could not remember what errand he went to do and was driving around. He could not  remember where he would be going, but then figures it out. He repeatedly says he misplaced his hearing aids. His wife was driving yesterday and they brought her car to be serviced, after going to another place, he could not remember that they had brought her car in earlier. He had an unusual episode last week when he came upstairs and told his wife that they had to go and leave now, he was dressed in 2 pairs of boxer shorts and his shirt on backward (unusual, he is usually a fastidious dresser per wife), ready to go somewhere late at night. She found that he had pulled out his clothes and medications and wrapped his medications in his khaki pants. She directed him to bed and he went to sleep. He saw his PCP and had normal bloodwork and urinalysis. I personally reviewed head CT without contrast done 5/26 which did not show any acute changes, there was diffuse atrophy and chronic microvascular disease. His wife notes he is much more alert in the daytime and more confused at night. He was started on Donepezil 5mg  daily, which he is tolerating without side effects. There is no family history of dementia, no history of significant head injuries. He usually has 1-2 alcohol drinks at night.   He denies any headaches, dizziness (except upon standing quickly), diplopia, dysarthria/dysphagia, neck/back pain, focal numbness/tingling/weakness, bowel dysfunction, anosmia. He has infrequent incontinence. He has had right hand shaking occasionally around 3-4 times a week. He has a history of sleepwalking. His wife has not noticed any staring/unresponsive episodes. No paranoia or hallucinations. He has trouble sleeping and takes 1/2 tablet of Ambien. He has fallen a couple of times, last fall was 3 weeks ago.   CURRENT MEDICATIONS:  Outpatient Encounter Medications as of 10/05/2021  Medication Sig   acetaminophen (TYLENOL) 500 MG tablet Take 1,000 mg by mouth every 6 (six) hours as needed.   acidophilus (RISAQUAD) CAPS  capsule Take 1 capsule by mouth daily.   apixaban (ELIQUIS) 5 MG TABS tablet Take 1 tablet (5 mg total) by mouth 2 (two) times daily.   aspirin EC 81 MG tablet Take 81 mg by mouth daily.   donepezil (ARICEPT) 10 MG tablet Take 1/2 tablet daily   escitalopram (LEXAPRO) 20 MG tablet Take 1 tablet daily   Evolocumab (REPATHA SURECLICK) 660 MG/ML SOAJ INJECT 1 PEN INTO SKIN EVERY 14 DAYS   ezetimibe (ZETIA) 10 MG tablet Take 0.5 tablets (5 mg total) by mouth daily.   lisinopril (ZESTRIL) 10 MG tablet Take 1 tablet (10 mg total) by mouth daily.   memantine (NAMENDA) 10 MG tablet Take 1 tablet (10 mg total) by mouth at bedtime.   metoprolol tartrate (LOPRESSOR) 25 MG tablet Take 1 tablet (25 mg total) by mouth 2 (two) times daily. (Patient taking differently: Take 25 mg by mouth at bedtime.)   nitroGLYCERIN (NITROSTAT) 0.4 MG SL tablet ONE TABLET UNDER TONGUE AS NEEDED FOR CHEST PAIN. MAY REPEAT IN 5 MINUTES  AND AGAIN IN 5 MINUTES (TOTAL OF 3 TABLETS)   omeprazole (PRILOSEC) 40 MG capsule TAKE ONE CAPSULE EACH DAY   oxyCODONE-acetaminophen (PERCOCET/ROXICET) 5-325 MG tablet TAKE ONE TABLET EVERY EIGHT HOURS AS NEEDED FOR SEVERE PAIN   Propylene Glycol (SYSTANE BALANCE OP) Place 2 drops into both eyes daily as needed (dry eyes).    rosuvastatin (CRESTOR) 5 MG tablet Take 1 tablet (5 mg total) by mouth daily.   valACYclovir (VALTREX) 500 MG tablet Take 1 tablet (500 mg total) by mouth 2 (two) times daily.   zolpidem (AMBIEN) 10 MG tablet TAKE 1/2 TO 1 TABLET AT BEDTIME AS NEEDED FOR SLEEP   No facility-administered encounter medications on file as of 10/05/2021.     Objective:     PHYSICAL EXAMINATION:    VITALS:   Vitals:   10/05/21 1046  BP: 107/74  Pulse: 76  Resp: 18  SpO2: 97%  Weight: 181 lb (82.1 kg)  Height: 6' (1.829 m)     GEN:  The patient appears stated age and is in NAD. HEENT:  Normocephalic, atraumatic.   Neurological examination:  General: NAD, well-groomed,  appears stated age. Orientation: The patient is alert. Oriented to person, year is 2020, late Spring, Monday, June, ? Today's date Cranial nerves: There is good facial symmetry. The speech is fluent and clear. No aphasia or dysarthria. Fund of knowledge is appropriate. Recent and remote memory are impaired. Attention and concentration are impaired. Able to name objects and repeat phrases. Unable to perform delayed recall  Hearing is intact to conversational tone with heating aid.    Sensation: Sensation is intact to light touch throughout Motor: Strength is at least antigravity x4.  Montreal Cognitive Assessment  03/10/2020 04/16/2019  Visuospatial/ Executive (0/5) 1 3  Naming (0/3) 3 3  Attention: Read list of digits (0/2) 2 2  Attention: Read list of letters (0/1) 1 1  Attention: Serial 7 subtraction starting at 100 (0/3) 3 3  Language: Repeat phrase (0/2) 1 1  Language : Fluency (0/1) 1 1  Abstraction (0/2) 1 2  Delayed Recall (0/5) 0 2  Orientation (0/6) 3 6  Total 16 24  Adjusted Score (based on education) 16 -     MMSE - Mini Mental State Exam 10/05/2021 04/03/2021 11/22/2017  Orientation to time 2 1 5   Orientation to Place 3 4 5   Registration 3 3 3   Attention/ Calculation 1 4 5   Recall 0 0 1  Language- name 2 objects 2 2 2   Language- repeat 1 1 1   Language- follow 3 step command 3 3 3   Language- read & follow direction 1 1 1   Write a sentence 1 1 1   Copy design 0 1 1  Total score 17 21 28       Movement examination: Tone: There is normal tone in the UE/LE Abnormal movements:  very mild, intentional tremor.  No myoclonus.  No asterixis.   Coordination:  There is no decremation with RAM's. Finger to nose normal  Gait and Station: The patient has no difficulty arising out of a deep-seated chair without the use of the hands. The patient's stride length is good.  Gait is cautious and narrow.      Total time spent on today's visit was 40  minutes, including both face-to-face  time and nonface-to-face time.  Time included that spent on review of records (prior notes available to me/labs/imaging if pertinent), discussing treatment and goals, answering patient's questions and coordinating care.  Cc:  Plotnikov,  Evie Lacks, MD Sharene Butters, PA-C

## 2021-10-05 ENCOUNTER — Encounter: Payer: Self-pay | Admitting: Physician Assistant

## 2021-10-05 ENCOUNTER — Ambulatory Visit (INDEPENDENT_AMBULATORY_CARE_PROVIDER_SITE_OTHER): Payer: Medicare Other | Admitting: Physician Assistant

## 2021-10-05 ENCOUNTER — Other Ambulatory Visit: Payer: Self-pay

## 2021-10-05 ENCOUNTER — Ambulatory Visit: Payer: Medicare Other | Admitting: Student

## 2021-10-05 VITALS — BP 107/74 | HR 76 | Resp 18 | Ht 72.0 in | Wt 181.0 lb

## 2021-10-05 DIAGNOSIS — M0609 Rheumatoid arthritis without rheumatoid factor, multiple sites: Secondary | ICD-10-CM | POA: Diagnosis not present

## 2021-10-05 DIAGNOSIS — F0150 Vascular dementia without behavioral disturbance: Secondary | ICD-10-CM | POA: Diagnosis not present

## 2021-10-05 DIAGNOSIS — I2510 Atherosclerotic heart disease of native coronary artery without angina pectoris: Secondary | ICD-10-CM | POA: Diagnosis not present

## 2021-10-05 NOTE — Patient Instructions (Signed)
It was a pleasure to see you today at our office.   Recommendations:  Meds:Continue taking  donepezil 5 mg daily            COntinue Memantine  10mg  tablets at night.                Follow up in 6  months            Please wear your hering aids   RECOMMENDATIONS FOR ALL PATIENTS WITH MEMORY PROBLEMS: 1. Continue to exercise (Recommend 30 minutes of walking everyday, or 3 hours every week) 2. Increase social interactions - continue going to [place] and enjoy social gatherings with friends and family 3. Eat healthy, avoid fried foods and eat more fruits and vegetables 4. Maintain adequate blood pressure, blood sugar, and blood cholesterol level. Reducing the risk of stroke and cardiovascular disease also helps promoting better memory. 5. Avoid stressful situations. Live a simple life and avoid aggravations. Organize your time and prepare for the next day in anticipation. 6. Sleep well, avoid any interruptions of sleep and avoid any distractions in the bedroom that may interfere with adequate sleep quality 7. Avoid sugar, avoid sweets as there is a strong link between excessive sugar intake, diabetes, and cognitive impairment We discussed the Mediterranean diet, which has been shown to help patients reduce the risk of progressive memory disorders and reduces cardiovascular risk. This includes eating fish, eat fruits and green leafy vegetables, nuts like almonds and hazelnuts, walnuts, and also use olive oil. Avoid fast foods and fried foods as much as possible. Avoid sweets and sugar as sugar use has been linked to worsening of memory function.  There is always a concern of gradual progression of memory problems. If this is the case, then we may need to adjust level of care according to patient needs. Support, both to the patient and caregiver, should then be put into place.      FALL PRECAUTIONS: Be cautious when walking. Scan the area for obstacles that may increase the risk of trips and falls.  When getting up in the mornings, sit up at the edge of the bed for a few minutes before getting out of bed. Consider elevating the bed at the head end to avoid drop of blood pressure when getting up. Walk always in a well-lit room (use night lights in the walls). Avoid area rugs or power cords from appliances in the middle of the walkways. Use a walker or a cane if necessary and consider physical therapy for balance exercise. Get your eyesight checked regularly.    HOME SAFETY: Consider the safety of the kitchen when operating appliances like stoves, microwave oven, and blender. Consider having supervision and share cooking responsibilities until no longer able to participate in those. Accidents with firearms and other hazards in the house should be identified and addressed as well.   ABILITY TO BE LEFT ALONE: If patient is unable to contact 911 operator, consider using LifeLine, or when the need is there, arrange for someone to stay with patients. Smoking is a fire hazard, consider supervision or cessation. Risk of wandering should be assessed by caregiver and if detected at any point, supervision and safe proof recommendations should be instituted.  MEDICATION SUPERVISION: Inability to self-administer medication needs to be constantly addressed. Implement a mechanism to ensure safe administration of the medications.    Mediterranean Diet A Mediterranean diet refers to food and lifestyle choices that are based on the traditions of countries located on  the [place]. This way of eating has been shown to help prevent certain conditions and improve outcomes for people who have chronic diseases, like kidney disease and heart disease. What are tips for following this plan? Lifestyle  Cook and eat meals together with your family, when possible. Drink enough fluid to keep your urine clear or pale yellow. Be physically active every day. This includes: Aerobic exercise like running or  swimming. Leisure activities like gardening, walking, or housework. Get 7-8 hours of sleep each night. If recommended by your health care provider, drink red wine in moderation. This means 1 glass a day for nonpregnant women and 2 glasses a day for men. A glass of wine equals 5 oz (150 mL). Reading food labels  Check the serving size of packaged foods. For foods such as rice and pasta, the serving size refers to the amount of cooked product, not dry. Check the total fat in packaged foods. Avoid foods that have saturated fat or trans fats. Check the ingredients list for added sugars, such as corn syrup. Shopping  At the grocery store, buy most of your food from the areas near the walls of the store. This includes: Fresh fruits and vegetables (produce). Grains, beans, nuts, and seeds. Some of these may be available in unpackaged forms or large amounts (in bulk). Fresh seafood. Poultry and eggs. Low-fat dairy products. Buy whole ingredients instead of prepackaged foods. Buy fresh fruits and vegetables in-season from local farmers markets. Buy frozen fruits and vegetables in resealable bags. If you do not have access to quality fresh seafood, buy precooked frozen shrimp or canned fish, such as tuna, salmon, or sardines. Buy small amounts of raw or cooked vegetables, salads, or olives from the deli or salad bar at your store. Stock your pantry so you always have certain foods on hand, such as olive oil, canned tuna, canned tomatoes, rice, pasta, and beans. Cooking  Cook foods with extra-virgin olive oil instead of using butter or other vegetable oils. Have meat as a side dish, and have vegetables or grains as your main dish. This means having meat in small portions or adding small amounts of meat to foods like pasta or stew. Use beans or vegetables instead of meat in common dishes like chili or lasagna. Experiment with different cooking methods. Try roasting or broiling vegetables instead of  steaming or sauteing them. Add frozen vegetables to soups, stews, pasta, or rice. Add nuts or seeds for added healthy fat at each meal. You can add these to yogurt, salads, or vegetable dishes. Marinate fish or vegetables using olive oil, lemon juice, garlic, and fresh herbs. Meal planning  Plan to eat 1 vegetarian meal one day each week. Try to work up to 2 vegetarian meals, if possible. Eat seafood 2 or more times a week. Have healthy snacks readily available, such as: Vegetable sticks with hummus. Greek yogurt. Fruit and nut trail mix. Eat balanced meals throughout the week. This includes: Fruit: 2-3 servings a day Vegetables: 4-5 servings a day Low-fat dairy: 2 servings a day Fish, poultry, or lean meat: 1 serving a day Beans and legumes: 2 or more servings a week Nuts and seeds: 1-2 servings a day Whole grains: 6-8 servings a day Extra-virgin olive oil: 3-4 servings a day Limit red meat and sweets to only a few servings a month What are my food choices? Mediterranean diet Recommended Grains: Whole-grain pasta. Brown rice. Bulgar wheat. Polenta. Couscous. Whole-wheat bread. [place]. Vegetables: Artichokes. Beets. Broccoli. Cabbage.  Carrots. Eggplant. Green beans. Chard. Kale. Spinach. Onions. Leeks. Peas. Squash. Tomatoes. Peppers. Radishes. Fruits: Apples. Apricots. Avocado. Berries. Bananas. Cherries. Dates. Figs. Grapes. Lemons. Melon. Oranges. Peaches. Plums. Pomegranate. Meats and other protein foods: Beans. Almonds. Sunflower seeds. Pine nuts. Peanuts. Rock. Salmon. Scallops. Shrimp. [place]. Tilapia. Clams. Oysters. Eggs. Dairy: Low-fat milk. Cheese. Greek yogurt. Beverages: Water. Red wine. Herbal tea. Fats and oils: Extra virgin olive oil. Avocado oil. Grape seed oil. Sweets and desserts: [place] yogurt with honey. Baked apples. Poached pears. Trail mix. Seasoning and other foods: Basil. Cilantro. Coriander. Cumin. Mint. Parsley. Sage. Rosemary. Tarragon. Garlic.  Oregano. Thyme. Pepper. Balsalmic vinegar. Tahini. Hummus. Tomato sauce. Olives. Mushrooms. Limit these Grains: Prepackaged pasta or rice dishes. Prepackaged cereal with added sugar. Vegetables: Deep fried potatoes (french fries). Fruits: Fruit canned in syrup. Meats and other protein foods: Beef. Pork. Lamb. Poultry with skin. Hot dogs. Berniece Salines. Dairy: Ice cream. Sour cream. Whole milk. Beverages: Juice. Sugar-sweetened soft drinks. Beer. Liquor and spirits. Fats and oils: Butter. Canola oil. Vegetable oil. Beef fat (tallow). Lard. Sweets and desserts: Cookies. Cakes. Pies. Candy. Seasoning and other foods: Mayonnaise. Premade sauces and marinades. The items listed may not be a complete list. Talk with your dietitian about what dietary choices are right for you. Summary The Mediterranean diet includes both food and lifestyle choices. Eat a variety of fresh fruits and vegetables, beans, nuts, seeds, and whole grains. Limit the amount of red meat and sweets that you eat. Talk with your health care provider about whether it is safe for you to drink red wine in moderation. This means 1 glass a day for nonpregnant women and 2 glasses a day for men. A glass of wine equals 5 oz (150 mL). This information is not intended to replace advice given to you by your health care provider. Make sure you discuss any questions you have with your health care provider. Document Released: 06/24/2016 Document Revised: 07/27/2016 Document Reviewed: 06/24/2016 Elsevier Interactive Patient Education  2017 [employer].

## 2021-10-06 NOTE — Progress Notes (Signed)
PCP:  Cassandria Anger, MD Primary Cardiologist: Fransico Him, MD Electrophysiologist: Thompson Grayer, MD   [name] is a 87 y.o. male seen today for Thompson Grayer, MD for routine electrophysiology followup.  Since last being seen in our clinic the patient reports doing very well. He and his wife have moved to [place]  He has intermittent lightheadedness with rapid standing, but it is not marked or limiting..  he denies chest pain, palpitations, dyspnea, PND, orthopnea, nausea, vomiting, syncope, edema, weight gain, or early satiety.  Past Medical History:  Diagnosis Date   CAD (coronary artery disease)    a. s/p inferior STEMI 01/08/2014; LHC 01/08/14: total RCA occlusion s/p 3.5x28mm Xience DES distal RCA and 3.5x28 mm DES mid RCA, 60-70% mid LAD stenosis, EF 55%.   Complication of anesthesia    'long to wake up after back surgery " 09/2003   Diverticulosis of colon    GERD (gastroesophageal reflux disease)    History of cellulitis    05-26-2015  LLE   History of colon polyps    1998- benign/  2008 adenomatous    History of kidney stones    2013   History of squamous cell carcinoma excision    2013;  2015;  06-12-2015 right leg/  02/ and 05/ 2017  left ear and left leg   Hydronephrosis, left    Hyperlipidemia    Hypertension    Migraine with aura    OSA on CPAP 06/19/2015   Moderate OSA with AHI 18/hr  per study 05-20-2015   Osteoarthritis    Paroxysmal atrial fibrillation ([place]) 4/16   chads2vasc score is at least 4   Premature atrial contractions    RA (rheumatoid arthritis) [facility])    rheumatologist-  dr Leigh Aurora   Sinusitis, chronic 10/17/2015   Wears glasses    Wears hearing aid    bilateral   Past Surgical History:  Procedure Laterality Date   BACK SURGERY     CARDIOVASCULAR STRESS TEST  11/21/2015   Low risk nuclear study w/ a small diaphragmatic attenuation artifact, no ischemia/  normal LV function and wall motion , ef 63%   CATARACT EXTRACTION W/  INTRAOCULAR LENS  IMPLANT, BILATERAL  2015   COLONOSCOPY  last one 06-09-2011   CORONARY ANGIOPLASTY     CYSTOSCOPY W/ RETROGRADES Left 07/12/2016   Procedure: CYSTOSCOPY WITH RETROGRADE PYELOGRAM LEFT URETERAL STENT;  Surgeon: Irine Seal, MD;  Location: [facility];  Service: Urology;  Laterality: Left;   CYSTOSCOPY WITH RETROGRADE PYELOGRAM, URETEROSCOPY AND STENT PLACEMENT Left 08/11/2016   Procedure: CYSTOSCOPY WITH RETROGRADE PYELOGRAM,  DIAGNOSTIC URETEROSCOPY , STENT EXCHANGE;  Surgeon: Alexis Frock, MD;  Location: [facility];  Service: Urology;  Laterality: Left;   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   severe fibromatosis palm and fingers   LEFT HEART CATHETERIZATION WITH CORONARY ANGIOGRAM N/A 01/08/2014   Procedure: LEFT HEART CATHETERIZATION WITH CORONARY ANGIOGRAM;  Surgeon: Troy Sine, MD;  Location: [facility] CATH LAB;  Service: Cardiovascular;  Laterality:N/A;  total/ subtotal RCA/  mLAD 65-03% w/ mid systolic bridging/  preserved global LVF, ef 55%   MOHS SURGERY  x2  feb and may 2017   left ear /  left leg  ([facility])   PERCUTANEOUS CORONARY STENT INTERVENTION (PCI-S)  01/08/2014   Procedure: PERCUTANEOUS CORONARY STENT INTERVENTION (PCI-S);  Surgeon: Troy Sine, MD;  Location: [facility] CATH LAB;  Service: Cardiovascular;;  DES to mid and distal RCA   POSTERIOR LUMBAR FUSION  10/01/2003   and Laminectomy/ diskectomy  L4 -- S1   ROBOT ASSISTED PYELOPLASTY Left 09/15/2016   Procedure: XI ROBOTIC ASSISTED PYELOPLASTY;  Surgeon: Alexis Frock, MD;  Location: [facility];  Service: Urology;  Laterality: Left;   TONSILLECTOMY     TRANSTHORACIC ECHOCARDIOGRAM  02/23/2016   mild LVH,  ef 50-55%,  grade 1 diastolic dysfunction/  mild to moderate AV calcification w/ no stenosis or regurg./  trivial MR and TR/  mild PR    Current Outpatient Medications  Medication Sig Dispense Refill   abatacept (ORENCIA) 250 MG injection every 30 (thirty) days.     acetaminophen (TYLENOL) 500  MG tablet Take 1,000 mg by mouth every 6 (six) hours as needed.     acidophilus (RISAQUAD) CAPS capsule Take 1 capsule by mouth daily.     apixaban (ELIQUIS) 5 MG TABS tablet Take 1 tablet (5 mg total) by mouth 2 (two) times daily. 180 tablet 1   aspirin EC 81 MG tablet Take 81 mg by mouth daily.     escitalopram (LEXAPRO) 20 MG tablet Take 1 tablet daily 90 tablet 3   Evolocumab (REPATHA SURECLICK) 782 MG/ML SOAJ INJECT 1 PEN INTO SKIN EVERY 14 DAYS 2 mL 11   ezetimibe (ZETIA) 10 MG tablet Take 0.5 tablets (5 mg total) by mouth daily. 45 tablet 3   lisinopril (ZESTRIL) 10 MG tablet Take 1 tablet (10 mg total) by mouth daily. 90 tablet 1   memantine (NAMENDA) 10 MG tablet Take 1 tablet (10 mg total) by mouth at bedtime. 90 tablet 3   metoprolol tartrate (LOPRESSOR) 25 MG tablet Take 1 tablet (25 mg total) by mouth 2 (two) times daily. (Patient taking differently: Take 25 mg by mouth at bedtime.) 180 tablet 3   nitroGLYCERIN (NITROSTAT) 0.4 MG SL tablet ONE TABLET UNDER TONGUE AS NEEDED FOR CHEST PAIN. MAY REPEAT IN 5 MINUTES AND AGAIN IN 5 MINUTES (TOTAL OF 3 TABLETS) 25 tablet 6   omeprazole (PRILOSEC) 40 MG capsule TAKE ONE CAPSULE EACH DAY 90 capsule 3   oxyCODONE-acetaminophen (PERCOCET/ROXICET) 5-325 MG tablet TAKE ONE TABLET EVERY EIGHT HOURS AS NEEDED FOR SEVERE PAIN 90 tablet 0   Propylene Glycol (SYSTANE BALANCE OP) Place 2 drops into both eyes daily as needed (dry eyes).      rosuvastatin (CRESTOR) 5 MG tablet Take 1 tablet (5 mg total) by mouth daily. 90 tablet 1   valACYclovir (VALTREX) 500 MG tablet Take 1 tablet (500 mg total) by mouth 2 (two) times daily. 14 tablet 1   zolpidem (AMBIEN) 10 MG tablet TAKE 1/2 TO 1 TABLET AT BEDTIME AS NEEDED FOR SLEEP 30 tablet 3   donepezil (ARICEPT) 10 MG tablet Take 1/2 tablet daily (Patient not taking: Reported on 10/07/2021) 45 tablet 3   No current facility-administered medications for this visit.    Allergies  Allergen Reactions    Methotrexate Other (See Comments)    REACTION: tachycardia   Aricept [Donepezil Hcl]     nightmares    Crestor [Rosuvastatin Calcium]     Myalgias with 20mg  dose   Lisinopril Other (See Comments)    cough   Namenda [Memantine]     n/d   Atorvastatin Other (See Comments)    Muscle pain, leg cramps   Pravastatin Sodium Other (See Comments)    REACTION: cramps, fatigue    Social History   Socioeconomic History   Marital status: Married    Spouse name: Not on file   Number of children: 2  Years of education: Not on file   Highest education level: Not on file  Occupational History   Occupation: Retired    Fish farm manager: RETIRED  Tobacco Use   Smoking status: Former    Years: 10.00    Types: Cigarettes    Quit date: 08/09/1968    Years since quitting: 53.1   Smokeless tobacco: Never  Vaping Use   Vaping Use: Never used  Substance and Sexual Activity   Alcohol use: Yes    Alcohol/week: 2.0 standard drinks    Types: 2 Shots of liquor per week    Comment: daily   Drug use: No   Sexual activity: Yes    Partners: Female  Other Topics Concern   Not on file  Social History Narrative   1 Caffeine drink daily    Right handed       Lives with wife      Social Determinants of Health   Financial Resource Strain: Low Risk    Difficulty of Paying Living Expenses: Not hard at all  Food Insecurity: No Food Insecurity   Worried About Charity fundraiser in the Last Year: Never true   Arboriculturist in the Last Year: Never true  Transportation Needs: No Transportation Needs   Lack of Transportation (Medical): No   Lack of Transportation (Non-Medical): No  Physical Activity: Sufficiently Active   Days of Exercise per Week: 5 days   Minutes of Exercise per Session: 30 min  Stress: No Stress Concern Present   Feeling of Stress : Not at all  Social Connections: Socially Integrated   Frequency of Communication with Friends and Family: More than three times a week   Frequency of  Social Gatherings with Friends and Family: More than three times a week   Attends Religious Services: More than 4 times per year   Active Member of [employer] or Organizations: Yes   Attends Music therapist: More than 4 times per year   Marital Status: Married  Human resources officer Violence: Not At Risk   Fear of Current or Ex-Partner: No   Emotionally Abused: No   Physically Abused: No   Sexually Abused: No     Review of Systems: All other systems reviewed and are otherwise negative except as noted above.  Physical Exam: There were no vitals filed for this visit.  GEN- The patient is well appearing, alert and oriented x 3 today.   HEENT: normocephalic, atraumatic; sclera clear, conjunctiva pink; hearing intact; oropharynx clear; neck supple, no JVP Lymph- no cervical lymphadenopathy Lungs- Clear to ausculation bilaterally, normal work of breathing.  No wheezes, rales, rhonchi Heart- Regular rate and rhythm, no murmurs, rubs or gallops, PMI not laterally displaced GI- soft, non-tender, non-distended, bowel sounds present, no hepatosplenomegaly Extremities- no clubbing, cyanosis, or edema; DP/PT/radial pulses 2+ bilaterally MS- no significant deformity or atrophy Skin- warm and dry, no rash or lesion Psych- euthymic mood, full affect Neuro- strength and sensation are intact  EKG is ordered. Personal review of EKG from today shows NSR at 69 bpm, stable intervals  Additional studies reviewed include: Previous EP office notes.   Assessment and Plan:  1. Paroxysmal Afib EKG shows NSR with stable intervals  Asymptomatic. Continue Eliquis as below.    2. Secondary Hypercoagulable State (ICD10:  D68.69) The patient is at high risk of stroke/thromboembolism with CHA2DS2VASC of at least 4 on appropriately dosed Eliquis by most recent labs.     Stressed importance of fall prevention. Recommended night  light between the bed and the bathroom and removing any potential obstacles  such as rugs.    3. CAD Denies s/s ischemia Continue ASA, BB, Repatha   4. HTN Stable on current regimen of lisinopril and lopressor.    5. HLD Continue Repatha and crestor per [facility]. Appreciate management.    6. OSA Encouraged nightly use.   Follow up with Dr. Rayann Heman in 12 months, sooner with issues.    Shirley Friar, PA-C  10/07/21 10:41 AM

## 2021-10-07 ENCOUNTER — Encounter: Payer: Self-pay | Admitting: Student

## 2021-10-07 ENCOUNTER — Other Ambulatory Visit: Payer: Self-pay

## 2021-10-07 ENCOUNTER — Ambulatory Visit (INDEPENDENT_AMBULATORY_CARE_PROVIDER_SITE_OTHER): Payer: Medicare Other | Admitting: Student

## 2021-10-07 VITALS — BP 112/80 | HR 69 | Ht 72.0 in | Wt 182.0 lb

## 2021-10-07 DIAGNOSIS — Z9989 Dependence on other enabling machines and devices: Secondary | ICD-10-CM | POA: Diagnosis not present

## 2021-10-07 DIAGNOSIS — G4733 Obstructive sleep apnea (adult) (pediatric): Secondary | ICD-10-CM | POA: Diagnosis not present

## 2021-10-07 DIAGNOSIS — I2510 Atherosclerotic heart disease of native coronary artery without angina pectoris: Secondary | ICD-10-CM | POA: Diagnosis not present

## 2021-10-07 DIAGNOSIS — I480 Paroxysmal atrial fibrillation: Secondary | ICD-10-CM | POA: Diagnosis not present

## 2021-10-07 DIAGNOSIS — I10 Essential (primary) hypertension: Secondary | ICD-10-CM

## 2021-10-07 NOTE — Patient Instructions (Signed)
Medication Instructions:  Your physician recommends that you continue on your current medications as directed. Please refer to the Current Medication list given to you today.  *If you need a refill on your cardiac medications before your next appointment, please call your pharmacy*   Lab Work: None ordered.  If you have labs (blood work) drawn today and your tests are completely normal, you will receive your results only by: [place] (if you have MyChart) OR A paper copy in the mail If you have any lab test that is abnormal or we need to change your treatment, we will call you to review the results.   Testing/Procedures: None ordered.    Follow-Up: At [facility], you and your health needs are our priority.  As part of our continuing mission to provide you with exceptional heart care, we have created designated Provider Care Teams.  These Care Teams include your primary Cardiologist (physician) and Advanced Practice Providers (APPs -  Physician Assistants and Nurse Practitioners) who all work together to provide you with the care you need, when you need it.  We recommend signing up for the patient portal called "MyChart".  Sign up information is provided on this After Visit Summary.  MyChart is used to connect with patients for Virtual Visits (Telemedicine).  Patients are able to view lab/test results, encounter notes, upcoming appointments, etc.  Non-urgent messages can be sent to your provider as well.   To learn more about what you can do with MyChart, go to [email or web address].    Your next appointment:   12 months with Oda Kilts, PA-C or Tommye Standard, PA-C1}    Other Instructions Try taking your Lisinopril at bedtime instead of in the mornings.

## 2021-11-03 DIAGNOSIS — M0609 Rheumatoid arthritis without rheumatoid factor, multiple sites: Secondary | ICD-10-CM | POA: Diagnosis not present

## 2021-11-10 ENCOUNTER — Telehealth: Payer: Self-pay | Admitting: Adult Health

## 2021-11-10 ENCOUNTER — Encounter (HOSPITAL_BASED_OUTPATIENT_CLINIC_OR_DEPARTMENT_OTHER): Payer: Self-pay | Admitting: Emergency Medicine

## 2021-11-10 ENCOUNTER — Emergency Department (HOSPITAL_BASED_OUTPATIENT_CLINIC_OR_DEPARTMENT_OTHER): Payer: Medicare Other

## 2021-11-10 ENCOUNTER — Emergency Department (HOSPITAL_BASED_OUTPATIENT_CLINIC_OR_DEPARTMENT_OTHER)
Admission: EM | Admit: 2021-11-10 | Discharge: 2021-11-10 | Disposition: A | Discharge status: Home / Self Care | Payer: Medicare Other | Attending: Emergency Medicine | Admitting: Emergency Medicine

## 2021-11-10 ENCOUNTER — Other Ambulatory Visit: Payer: Self-pay

## 2021-11-10 ENCOUNTER — Telehealth: Payer: Self-pay | Admitting: Internal Medicine

## 2021-11-10 DIAGNOSIS — Z7901 Long term (current) use of anticoagulants: Secondary | ICD-10-CM | POA: Diagnosis not present

## 2021-11-10 DIAGNOSIS — W1839XA Other fall on same level, initial encounter: Secondary | ICD-10-CM | POA: Insufficient documentation

## 2021-11-10 DIAGNOSIS — Z7982 Long term (current) use of aspirin: Secondary | ICD-10-CM | POA: Diagnosis not present

## 2021-11-10 DIAGNOSIS — I2510 Atherosclerotic heart disease of native coronary artery without angina pectoris: Secondary | ICD-10-CM | POA: Diagnosis not present

## 2021-11-10 DIAGNOSIS — Z79899 Other long term (current) drug therapy: Secondary | ICD-10-CM | POA: Insufficient documentation

## 2021-11-10 DIAGNOSIS — G309 Alzheimer's disease, unspecified: Secondary | ICD-10-CM | POA: Diagnosis not present

## 2021-11-10 DIAGNOSIS — J45909 Unspecified asthma, uncomplicated: Secondary | ICD-10-CM | POA: Diagnosis not present

## 2021-11-10 DIAGNOSIS — S0990XA Unspecified injury of head, initial encounter: Secondary | ICD-10-CM | POA: Diagnosis not present

## 2021-11-10 DIAGNOSIS — S0003XA Contusion of scalp, initial encounter: Secondary | ICD-10-CM | POA: Insufficient documentation

## 2021-11-10 DIAGNOSIS — Z87891 Personal history of nicotine dependence: Secondary | ICD-10-CM | POA: Diagnosis not present

## 2021-11-10 DIAGNOSIS — W19XXXA Unspecified fall, initial encounter: Secondary | ICD-10-CM

## 2021-11-10 DIAGNOSIS — I10 Essential (primary) hypertension: Secondary | ICD-10-CM | POA: Diagnosis not present

## 2021-11-10 MED ORDER — ACETAMINOPHEN 325 MG PO TABS: 650.0000 mg | ORAL_TABLET | Freq: Once | ORAL | Status: DC

## 2021-11-10 NOTE — ED Triage Notes (Signed)
Pt fell from bed last night. Pt lives at [facility]. Pt does not complain of pain.

## 2021-11-10 NOTE — Telephone Encounter (Signed)
Nurse Judeen Hammans called and stated that patient fell and sustained an abrasion on his occipital area. He is taking Eliquis and ordered for patient to be transferred to hospital for evaluation. Wife refused transfer to hospital since he was sleeping comfortably.

## 2021-11-10 NOTE — Telephone Encounter (Signed)
Patient fell and hit his head, advised his wife to take him to the Ed and make a follow up visit for the patient.

## 2021-11-10 NOTE — ED Provider Notes (Signed)
[name] Provider Note   CSN: [id number] Arrival date & time: 11/10/21  1012     History Chief Complaint  Patient presents with   [name] is a 87 y.o. male.  Patient is a 87 yo male with pmh as listed below presenting for fall. Pt states he was getting up to go to the bathroom in the middle of the in the night, felt light headed, and fell backwards. Admits to head trauma. On eliquis. Able to ambulate afterwards without difficulty. Denies sensation or motor dysfunction. Denies any bony pain at this time.   The history is provided by the patient and a significant other. No language interpreter was used.  Fall Pertinent negatives include no chest pain, no abdominal pain and no shortness of breath.      Past Medical History:  Diagnosis Date   CAD (coronary artery disease)    a. s/p inferior STEMI 01/08/2014; LHC 01/08/14: total RCA occlusion s/p 3.5x56mm Xience DES distal RCA and 3.5x28 mm DES mid RCA, 60-70% mid LAD stenosis, EF 55%.   Complication of anesthesia    'long to wake up after back surgery " 09/2003   Diverticulosis of colon    GERD (gastroesophageal reflux disease)    History of cellulitis    05-26-2015  LLE   History of colon polyps    1998- benign/  2008 adenomatous    History of kidney stones    2013   History of squamous cell carcinoma excision    2013;  2015;  06-12-2015 right leg/  02/ and 05/ 2017  left ear and left leg   Hydronephrosis, left    Hyperlipidemia    Hypertension    Migraine with aura    OSA on CPAP 06/19/2015   Moderate OSA with AHI 18/hr  per study 05-20-2015   Osteoarthritis    Paroxysmal atrial fibrillation ([place]) 4/16   chads2vasc score is at least 4   Premature atrial contractions    RA (rheumatoid arthritis) [facility])    rheumatologist-  dr Leigh Aurora   Sinusitis, chronic 10/17/2015   Wears glasses    Wears hearing aid    bilateral    Patient Active Problem List   Diagnosis Date  Noted   Atherosclerosis of aorta ([place]) 03/02/2021   COVID-19 virus infection 11/15/2020   Weight loss 09/29/2020   Near syncope 09/04/2020   Chest pain    Dyspepsia    Encounter for medication management 11/14/2019   Angular stomatitis 07/09/2019   Cervical radiculopathy 06/05/2019   Pain in joint of left shoulder 05/31/2019   Concussion 04/17/2019   Hemoptysis 04/05/2019   Confusion 04/05/2019   Alzheimer's dementia ([name]) 12/21/2018   Nausea & vomiting 02/07/2018   Chills 02/07/2018   Rash 02/07/2018   Insomnia 09/22/2017   Cervical spondylolysis 06/28/2017   Hyperglycemia 06/22/2017   Well adult exam 11/18/2016   Hydronephrosis 11/18/2016   UPJ obstruction, 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   Allergic rhinitis 06/02/2016   Asthma with exacerbation 06/02/2016   Sinusitis, chronic 10/17/2015   OSA (obstructive sleep apnea) 06/19/2015   Cellulitis and abscess of leg, except foot 05/26/2015   Paroxysmal atrial fibrillation ([name]) 02/21/2015   CAP (community acquired pneumonia) 01/02/2015   Upper respiratory infection 12/02/2014   Vivid dream 09/25/2014   Actinic keratoses 04/26/2014   CAD (coronary artery disease) 02/25/2014   STEMI (ST elevation myocardial infarction) ([name]) 01/08/2014   Rheumatoid bursitis of left elbow ([name]) 08/08/2013   Situational  depression 06/18/2013   Neck pain 04/23/2012   Acute abdominal pain in left flank 02/14/2012   Hypertension 09/23/2011   Chest pain, midsternal 09/13/2011   Mixed hyperlipidemia 07/23/2010   Palpitations 07/07/2010   Rheumatoid arthritis ([name]) 06/04/2010   PARESTHESIA 06/04/2010   Pain in joint 11/27/2009   MYALGIA 08/21/2009   ALLERGIC RHINITIS 06/11/2008   OSTEOARTHRITIS 06/11/2008   FATIGUE 06/11/2008   LOW BACK PAIN 07/27/2007   COLONIC POLYPS, HX OF 07/27/2007    Past Surgical History:  Procedure Laterality Date   BACK SURGERY     CARDIOVASCULAR STRESS TEST  11/21/2015   Low risk nuclear study w/ a small diaphragmatic  attenuation artifact, no ischemia/  normal LV function and wall motion , ef 63%   CATARACT EXTRACTION W/ INTRAOCULAR LENS  IMPLANT, BILATERAL  2015   COLONOSCOPY  last one 06-09-2011   CORONARY ANGIOPLASTY     CYSTOSCOPY W/ RETROGRADES Left 07/12/2016   Procedure: CYSTOSCOPY WITH RETROGRADE PYELOGRAM LEFT URETERAL STENT;  Surgeon: Irine Seal, MD;  Location: [facility];  Service: Urology;  Laterality: Left;   CYSTOSCOPY WITH RETROGRADE PYELOGRAM, URETEROSCOPY AND STENT PLACEMENT Left 08/11/2016   Procedure: CYSTOSCOPY WITH RETROGRADE PYELOGRAM,  DIAGNOSTIC URETEROSCOPY , STENT EXCHANGE;  Surgeon: Alexis Frock, MD;  Location[facility];  Service: Urology;  Laterality: Left;   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   severe fibromatosis palm and fingers   LEFT HEART CATHETERIZATION WITH CORONARY ANGIOGRAM N/A 01/08/2014   Procedure: LEFT HEART CATHETERIZATION WITH CORONARY ANGIOGRAM;  Surgeon: Troy Sine, MD;  Location: [name];  Service: Cardiovascular;  Laterality:N/A;  total/ subtotal RCA/  mLAD 35-00% w/ mid systolic bridging/  preserved global LVF, ef 55%   MOHS SURGERY  x2  feb and may 2017   left ear /  left leg  ([facility])   PERCUTANEOUS CORONARY STENT INTERVENTION (PCI-S)  01/08/2014   Procedure: PERCUTANEOUS CORONARY STENT INTERVENTION (PCI-S);  Surgeon: Troy Sine, MD;  Location[facility];  Service: Cardiovascular;;  DES to mid and distal RCA   POSTERIOR LUMBAR FUSION  10/01/2003   and Laminectomy/ diskectomy  L4 -- S1   ROBOT ASSISTED PYELOPLASTY Left 09/15/2016   Procedure: XI ROBOTIC ASSISTED PYELOPLASTY;  Surgeon: Alexis Frock, MD;  Location: [facility];  Service: Urology;  Laterality: Left;   TONSILLECTOMY     TRANSTHORACIC ECHOCARDIOGRAM  02/23/2016   mild LVH,  ef 50-55%,  grade 1 diastolic dysfunction/  mild to moderate AV calcification w/ no stenosis or regurg./  trivial MR and TR/  mild PR       Family History  Problem Relation Age of Onset    Hypertension Mother    Heart disease Father    Colon cancer Neg Hx     Social History   Tobacco Use   Smoking status: Former    Years: 10.00    Types: Cigarettes    Quit date: 08/09/1968    Years since quitting: 53.2   Smokeless tobacco: Never  Vaping Use   Vaping Use: Never used  Substance Use Topics   Alcohol use: Yes    Alcohol/week: 2.0 standard drinks    Types: 2 Shots of liquor per week    Comment: daily   Drug use: No    Home Medications Prior to Admission medications   Medication Sig Start Date End Date Taking? Authorizing Provider  abatacept (ORENCIA) 250 MG injection every 30 (thirty) days.    [provider]  acetaminophen (TYLENOL) 500  MG tablet Take 1,000 mg by mouth every 6 (six) hours as needed.    [provider]  acidophilus (RISAQUAD) CAPS capsule Take 1 capsule by mouth daily.    [provider]  apixaban (ELIQUIS) 5 MG TABS tablet Take 1 tablet (5 mg total) by mouth 2 (two) times daily. 03/30/21   Sueanne Margarita, MD  aspirin EC 81 MG tablet Take 81 mg by mouth daily.    [provider]  donepezil (ARICEPT) 10 MG tablet Take 1/2 tablet daily Patient not taking: Reported on 10/07/2021 04/03/21   Cameron Sprang, MD  escitalopram (LEXAPRO) 20 MG tablet Take 1 tablet daily 04/02/21   Plotnikov, Evie Lacks, MD  Evolocumab (REPATHA SURECLICK) 992 MG/ML SOAJ INJECT 1 PEN INTO SKIN EVERY 14 DAYS 03/30/21   Sueanne Margarita, MD  ezetimibe (ZETIA) 10 MG tablet Take 0.5 tablets (5 mg total) by mouth daily. 04/02/21   Plotnikov, Evie Lacks, MD  lisinopril (ZESTRIL) 10 MG tablet Take 1 tablet (10 mg total) by mouth daily. 03/30/21   Sueanne Margarita, MD  memantine (NAMENDA) 10 MG tablet Take 1 tablet (10 mg total) by mouth at bedtime. 04/03/21   Cameron Sprang, MD  metoprolol tartrate (LOPRESSOR) 25 MG tablet Take 1 tablet (25 mg total) by mouth 2 (two) times daily. Patient taking differently: Take 25 mg by mouth at bedtime. 04/21/21   Allred,  Jeneen Rinks, MD  nitroGLYCERIN (NITROSTAT) 0.4 MG SL tablet ONE TABLET UNDER TONGUE AS NEEDED FOR CHEST PAIN. MAY REPEAT IN 5 MINUTES AND AGAIN IN 5 MINUTES (TOTAL OF 3 TABLETS) 03/10/18   Allred, Jeneen Rinks, MD  omeprazole (PRILOSEC) 40 MG capsule TAKE ONE CAPSULE EACH DAY 04/02/21   Plotnikov, Evie Lacks, MD  oxyCODONE-acetaminophen (PERCOCET/ROXICET) 5-325 MG tablet TAKE ONE TABLET EVERY EIGHT HOURS AS NEEDED FOR SEVERE PAIN 07/13/20   Plotnikov, Evie Lacks, MD  Propylene Glycol (SYSTANE BALANCE OP) Place 2 drops into both eyes daily as needed (dry eyes).     [provider]  rosuvastatin (CRESTOR) 5 MG tablet Take 1 tablet (5 mg total) by mouth daily. 03/30/21   Sueanne Margarita, MD  valACYclovir (VALTREX) 500 MG tablet Take 1 tablet (500 mg total) by mouth 2 (two) times daily. 08/04/21   Plotnikov, Evie Lacks, MD  zolpidem (AMBIEN) 10 MG tablet TAKE 1/2 TO 1 TABLET AT BEDTIME AS NEEDED FOR SLEEP 08/11/21   Plotnikov, Evie Lacks, MD    Allergies    Methotrexate, Aricept Reather Littler hcl], Crestor [rosuvastatin calcium], Lisinopril, Namenda [memantine], Atorvastatin, and Pravastatin sodium  Review of Systems   Review of Systems  Constitutional:  Negative for chills and fever.  HENT:  Negative for ear pain and sore throat.   Eyes:  Negative for pain and visual disturbance.  Respiratory:  Negative for cough and shortness of breath.   Cardiovascular:  Negative for chest pain and palpitations.  Gastrointestinal:  Negative for abdominal pain and vomiting.  Genitourinary:  Negative for dysuria and hematuria.  Musculoskeletal:  Negative for arthralgias and back pain.  Skin:  Negative for color change and rash.  Neurological:  Negative for seizures and syncope.  All other systems reviewed and are negative.  Physical Exam Updated Vital Signs BP (!) 142/80 (BP Location: Right Arm)    Pulse 80    Temp 98 F (36.7 C) (Oral)    Resp 16    Ht 6' (1.829 m)    Wt 83.9 kg    SpO2 99%  BMI 25.09 kg/m   Physical  Exam Vitals and nursing note reviewed.  Constitutional:      General: He is not in acute distress.    Appearance: He is well-developed.  HENT:     Head:   Eyes:     General: Lids are normal. Vision grossly intact.     Conjunctiva/sclera: Conjunctivae normal.     Pupils: Pupils are equal, round, and reactive to light.     Visual Fields: Right eye visual fields normal and left eye visual fields normal.  Cardiovascular:     Rate and Rhythm: Normal rate and regular rhythm.     Heart sounds: No murmur heard. Pulmonary:     Effort: Pulmonary effort is normal. No respiratory distress.     Breath sounds: Normal breath sounds.  Abdominal:     Palpations: Abdomen is soft.     Tenderness: There is no abdominal tenderness.  Musculoskeletal:        General: No swelling.     Cervical back: Neck supple.  Skin:    General: Skin is warm and dry.     Capillary Refill: Capillary refill takes less than 2 seconds.  Neurological:     Mental Status: He is alert and oriented to person, place, and time.     GCS: GCS eye subscore is 4. GCS verbal subscore is 5. GCS motor subscore is 6.     Cranial Nerves: Cranial nerves 2-12 are intact.     Sensory: Sensation is intact.     Motor: Motor function is intact.     Coordination: Coordination is intact.  Psychiatric:        Mood and Affect: Mood normal.    ED Results / Procedures / Treatments   Labs (all labs ordered are listed, but only abnormal results are displayed) Labs Reviewed - No data to display  EKG None  Radiology No results Todd.  Procedures Procedures   Medications Ordered in ED Medications  acetaminophen (TYLENOL) tablet 650 mg (650 mg Oral Not Given 11/10/21 1104)    ED Course  I have reviewed the triage vital signs and the nursing notes.  Pertinent labs & imaging results that were available during my care of the patient were reviewed by me and considered in my medical decision making (see chart for details).    MDM  Rules/Calculators/A&P                         11:11 AM 87 yo male with pmh as listed below presenting for fall on eliquis with head trauma. Pt is Aox3, no acute distress, afebrile, with stable vitals. Physical exam demonstrates contusion to occipital region. No other bony pain or evidence of trauma. CT head demonstrates no acute process. Tylenol given in ED.   Falls secondary to orthostatic hypotension. Fall prevention discussed with patient and spouse. Patient in no distress and overall condition improved here in the ED. Detailed discussions were had with the patient regarding current findings, and need for close f/u with PCP or on call doctor. The patient has been instructed to return immediately if the symptoms worsen in any way for re-evaluation. Patient verbalized understanding and is in agreement with current care plan. All questions answered prior to discharge.    Final Clinical Impression(s) / ED Diagnoses Final diagnoses:  Fall, initial encounter  Contusion of scalp, initial encounter    Rx / DC Orders ED Discharge Orders     None  Lianne Cure, DO 79/00/92 1229

## 2021-11-17 DIAGNOSIS — Z20822 Contact with and (suspected) exposure to covid-19: Secondary | ICD-10-CM | POA: Diagnosis not present

## 2021-11-23 ENCOUNTER — Ambulatory Visit: Payer: Medicare Other | Admitting: Internal Medicine

## 2021-11-25 ENCOUNTER — Other Ambulatory Visit: Payer: Self-pay | Admitting: Cardiology

## 2021-11-25 NOTE — Telephone Encounter (Signed)
Prescription refill request for Eliquis received. Indication: afib  Last office visit: [place], 10/07/2021 Scr: 0.82, 02/15/2021 Age: 88 yo  Weight: 83.9 kg   Refill sent.

## 2021-12-02 ENCOUNTER — Other Ambulatory Visit: Payer: Self-pay

## 2021-12-02 ENCOUNTER — Ambulatory Visit (INDEPENDENT_AMBULATORY_CARE_PROVIDER_SITE_OTHER): Payer: Medicare Other | Admitting: Internal Medicine

## 2021-12-02 ENCOUNTER — Encounter: Payer: Self-pay | Admitting: Internal Medicine

## 2021-12-02 DIAGNOSIS — G308 Other Alzheimer's disease: Secondary | ICD-10-CM

## 2021-12-02 DIAGNOSIS — R42 Dizziness and giddiness: Secondary | ICD-10-CM

## 2021-12-02 DIAGNOSIS — F02B3 Dementia in other diseases classified elsewhere, moderate, with mood disturbance: Secondary | ICD-10-CM

## 2021-12-02 NOTE — Assessment & Plan Note (Signed)
Low BP Hydrate better Hold Lisinopril for now

## 2021-12-02 NOTE — Progress Notes (Signed)
Subjective:  Patient ID: [name], male    DOB: 06/29/1933  Age: 88 y.o. MRN: [id number]  CC: Follow-up   HPI [name] presents for dizziness and Reichen fell twice; no recent falls. F/u [facility] is here with his wife who is helping with history  Outpatient Medications Prior to Visit  Medication Sig Dispense Refill   abatacept (ORENCIA) 250 MG injection every 30 (thirty) days.     acetaminophen (TYLENOL) 500 MG tablet Take 1,000 mg by mouth every 6 (six) hours as needed.     acidophilus (RISAQUAD) CAPS capsule Take 1 capsule by mouth daily.     aspirin EC 81 MG tablet Take 81 mg by mouth daily.     donepezil (ARICEPT) 10 MG tablet Take 1/2 tablet daily 45 tablet 3   ELIQUIS 5 MG TABS tablet TAKE ONE TABLET BY MOUTH EVERY MORNING and TAKE ONE TABLET BY MOUTH EVERYDAY AT BEDTIME 180 tablet 1   escitalopram (LEXAPRO) 20 MG tablet Take 1 tablet daily 90 tablet 3   Evolocumab (REPATHA SURECLICK) 294 MG/ML SOAJ INJECT 1 PEN INTO SKIN EVERY 14 DAYS 2 mL 11   ezetimibe (ZETIA) 10 MG tablet Take 0.5 tablets (5 mg total) by mouth daily. 45 tablet 3   lisinopril (ZESTRIL) 10 MG tablet Take 1 tablet (10 mg total) by mouth daily. 90 tablet 1   memantine (NAMENDA) 10 MG tablet Take 1 tablet (10 mg total) by mouth at bedtime. 90 tablet 3   metoprolol tartrate (LOPRESSOR) 25 MG tablet Take 1 tablet (25 mg total) by mouth 2 (two) times daily. (Patient taking differently: Take 25 mg by mouth at bedtime.) 180 tablet 3   nitroGLYCERIN (NITROSTAT) 0.4 MG SL tablet ONE TABLET UNDER TONGUE AS NEEDED FOR CHEST PAIN. MAY REPEAT IN 5 MINUTES AND AGAIN IN 5 MINUTES (TOTAL OF 3 TABLETS) 25 tablet 6   omeprazole (PRILOSEC) 40 MG capsule TAKE ONE CAPSULE EACH DAY 90 capsule 3   oxyCODONE-acetaminophen (PERCOCET/ROXICET) 5-325 MG tablet TAKE ONE TABLET EVERY EIGHT HOURS AS NEEDED FOR SEVERE PAIN 90 tablet 0   Propylene Glycol (SYSTANE BALANCE OP) Place 2 drops into both eyes daily as needed (dry eyes).       rosuvastatin (CRESTOR) 5 MG tablet TAKE ONE TABLET BY MOUTH ONCE DAILY 90 tablet 3   valACYclovir (VALTREX) 500 MG tablet Take 1 tablet (500 mg total) by mouth 2 (two) times daily. 14 tablet 1   zolpidem (AMBIEN) 10 MG tablet TAKE 1/2 TO 1 TABLET AT BEDTIME AS NEEDED FOR SLEEP 30 tablet 3   No facility-administered medications prior to visit.    ROS: Review of Systems  Constitutional:  Negative for appetite change, fatigue and unexpected weight change.  HENT:  Negative for congestion, nosebleeds, sneezing, sore throat and trouble swallowing.   Eyes:  Negative for itching and visual disturbance.  Respiratory:  Negative for cough.   Cardiovascular:  Negative for chest pain, palpitations and leg swelling.  Gastrointestinal:  Negative for abdominal distention, blood in stool, diarrhea and nausea.  Genitourinary:  Negative for frequency and hematuria.  Musculoskeletal:  Positive for arthralgias and back pain. Negative for gait problem, joint swelling and neck pain.  Skin:  Negative for rash.  Neurological:  Positive for dizziness. Negative for tremors, speech difficulty and weakness.  Psychiatric/Behavioral:  Negative for agitation, dysphoric mood, sleep disturbance and suicidal ideas. The patient is not nervous/anxious.    Objective:  BP (!) 115/55 (BP Location: Right Arm, Patient Position: Standing)  Pulse 69    Temp 98.1 F (36.7 C) (Oral)    Ht 6' (1.829 m)    Wt 181 lb (82.1 kg)    SpO2 95%    BMI 24.55 kg/m   BP Readings from Last 3 Encounters:  12/02/21 (!) 115/55  11/10/21 (!) 148/70  10/07/21 112/80    Wt Readings from Last 3 Encounters:  12/02/21 181 lb (82.1 kg)  11/10/21 185 lb (83.9 kg)  10/07/21 182 lb (82.6 kg)    Physical Exam Constitutional:      General: He is not in acute distress.    Appearance: He is well-developed.     Comments: NAD  Eyes:     Conjunctiva/sclera: Conjunctivae normal.     Pupils: Pupils are equal, round, and reactive to light.  Neck:      Thyroid: No thyromegaly.     Vascular: No JVD.  Cardiovascular:     Rate and Rhythm: Normal rate and regular rhythm.     Heart sounds: Normal heart sounds. No murmur heard.   No friction rub. No gallop.  Pulmonary:     Effort: Pulmonary effort is normal. No respiratory distress.     Breath sounds: Normal breath sounds. No wheezing or rales.  Chest:     Chest wall: No tenderness.  Abdominal:     General: Bowel sounds are normal. There is no distension.     Palpations: Abdomen is soft. There is no mass.     Tenderness: There is no abdominal tenderness. There is no guarding or rebound.  Musculoskeletal:        General: No tenderness. Normal range of motion.     Cervical back: Normal range of motion.  Lymphadenopathy:     Cervical: No cervical adenopathy.  Skin:    General: Skin is warm and dry.     Findings: No rash.  Neurological:     Mental Status: He is alert and oriented to person, place, and time.     Cranial Nerves: No cranial nerve deficit.     Motor: No abnormal muscle tone.     Coordination: Coordination normal.     Gait: Gait normal.     Deep Tendon Reflexes: Reflexes are normal and symmetric.  Psychiatric:        Behavior: Behavior normal.        Thought Content: Thought content normal.        Judgment: Judgment normal.    Lab Results  Component Value Date   WBC 6.7 02/15/2021   HGB 13.2 02/15/2021   HCT 39.9 02/15/2021   PLT 304 02/15/2021   GLUCOSE 93 02/15/2021   CHOL 197 08/04/2020   TRIG 254 (H) 08/04/2020   HDL 47 08/04/2020   LDLDIRECT 168.9 05/24/2013   LDLCALC 106 (H) 08/04/2020   ALT 13 02/15/2021   AST 21 02/15/2021   NA 138 02/15/2021   K 3.9 02/15/2021   CL 108 02/15/2021   CREATININE 0.82 02/15/2021   BUN 15 02/15/2021   CO2 25 02/15/2021   TSH 1.18 12/13/2017   PSA 2.46 02/21/2017   INR 0.92 09/13/2016   HGBA1C 6.4 08/30/2017    CT Head Wo Contrast  Result Date: 11/10/2021 CLINICAL DATA:  Head trauma, minor (Age >= 65y) fall on  eliquis EXAM: CT HEAD WITHOUT CONTRAST TECHNIQUE: Contiguous axial images were obtained from the base of the skull through the vertex without intravenous contrast. COMPARISON:  04/13/2019 head CT. FINDINGS: Brain: No evidence of parenchymal hemorrhage or extra-axial fluid  collection. No mass lesion, mass effect, or midline shift. No CT evidence of acute infarction. Nonspecific prominent subcortical and periventricular white matter hypodensity, most in keeping with chronic small vessel ischemic change. Cerebral volume is age appropriate. No ventriculomegaly. Vascular: No acute abnormality. Skull: No evidence of calvarial fracture. Sinuses/Orbits: The visualized paranasal sinuses are essentially clear. Other:  The mastoid air cells are unopacified. IMPRESSION: 1. No evidence of acute intracranial abnormality. No evidence of calvarial fracture. 2. Prominent chronic small vessel ischemic changes in the cerebral white matter. Electronically Signed   By: Ilona Sorrel M.D.   On: 11/10/2021 12:16    Assessment & Plan:   Problem List Items Addressed This Visit     Alzheimer's dementia [facility])    Discussed with [name] and [name].  Continue with current plan      Dizzinesses    Low BP Hydrate better Hold Lisinopril for now         No orders of the defined types were placed in this encounter.     Follow-up: Return in about 6 weeks (around 01/13/2022) for a follow-up visit.  [name], MD

## 2021-12-02 NOTE — Patient Instructions (Addendum)
Low BP Hydrate better Hold Lisinopril for now

## 2021-12-04 DIAGNOSIS — M0609 Rheumatoid arthritis without rheumatoid factor, multiple sites: Secondary | ICD-10-CM | POA: Diagnosis not present

## 2021-12-21 NOTE — Assessment & Plan Note (Signed)
Discussed with [name] and [name].  Continue with current plan

## 2022-01-06 DIAGNOSIS — M0609 Rheumatoid arthritis without rheumatoid factor, multiple sites: Secondary | ICD-10-CM | POA: Diagnosis not present

## 2022-01-19 ENCOUNTER — Other Ambulatory Visit: Payer: Self-pay

## 2022-01-19 ENCOUNTER — Encounter: Payer: Self-pay | Admitting: Internal Medicine

## 2022-01-19 ENCOUNTER — Ambulatory Visit (INDEPENDENT_AMBULATORY_CARE_PROVIDER_SITE_OTHER): Payer: Medicare Other | Admitting: Internal Medicine

## 2022-01-19 DIAGNOSIS — G308 Other Alzheimer's disease: Secondary | ICD-10-CM

## 2022-01-19 DIAGNOSIS — M5440 Lumbago with sciatica, unspecified side: Secondary | ICD-10-CM | POA: Diagnosis not present

## 2022-01-19 DIAGNOSIS — M25562 Pain in left knee: Secondary | ICD-10-CM | POA: Insufficient documentation

## 2022-01-19 DIAGNOSIS — M5030 Other cervical disc degeneration, unspecified cervical region: Secondary | ICD-10-CM | POA: Insufficient documentation

## 2022-01-19 DIAGNOSIS — F02B3 Dementia in other diseases classified elsewhere, moderate, with mood disturbance: Secondary | ICD-10-CM

## 2022-01-19 MED ORDER — LIDOCAINE HCL 2 % IJ SOLN
20.0000 mL | Freq: Once | INTRAMUSCULAR | Status: AC
  Administered 2022-01-19: 400 mg

## 2022-01-19 MED ORDER — METHYLPREDNISOLONE ACETATE 40 MG/ML IJ SUSP
40.0000 mg | Freq: Once | INTRAMUSCULAR | Status: AC
  Administered 2022-01-19: 40 mg via INTRAMUSCULAR

## 2022-01-19 NOTE — Assessment & Plan Note (Signed)
Cont on Percocet prn - rare ? Potential benefits of a long term opioids use as well as potential risks (i.e. addiction risk, apnea etc) and complications (i.e. Somnolence, constipation and others) were explained to the patient and were aknowledged. ?

## 2022-01-19 NOTE — Assessment & Plan Note (Signed)
No change ?On  Donepezil  ?

## 2022-01-19 NOTE — Patient Instructions (Addendum)
Postprocedure instructions :  ? ? A Band-Aid should be left on for 12 hours. Injection therapy is not a cure itself. It is used in conjunction with other modalities. You can use nonsteroidal anti-inflammatories like ibuprofen , hot and cold compresses. Rest is recommended in the next 24 hours. You need to report immediately  if fever, chills or any signs of infection develop. ? ?Use a knee brace, trekking pole or poles ?

## 2022-01-19 NOTE — Assessment & Plan Note (Signed)
Worse ?Options discussed ?Percocet prn ?Will inject the L knee ?Use a knee brace, trekking pole or poles ?

## 2022-01-19 NOTE — Progress Notes (Signed)
Subjective:  Patient ID: [name], male    DOB: 06-20-1933  Age: 88 y.o. MRN: [id number]  CC: No chief complaint on file.   HPI [name] presents for L knee pain - new since 1 month ago. No injury. Subhan took Prednisone - it did not help much.   Outpatient Medications Prior to Visit  Medication Sig Dispense Refill   abatacept (ORENCIA) 250 MG injection every 30 (thirty) days.     acetaminophen (TYLENOL) 500 MG tablet Take 1,000 mg by mouth every 6 (six) hours as needed.     acidophilus (RISAQUAD) CAPS capsule Take 1 capsule by mouth daily.     aspirin EC 81 MG tablet Take 81 mg by mouth daily.     donepezil (ARICEPT) 10 MG tablet Take 1/2 tablet daily 45 tablet 3   ELIQUIS 5 MG TABS tablet TAKE ONE TABLET BY MOUTH EVERY MORNING and TAKE ONE TABLET BY MOUTH EVERYDAY AT BEDTIME 180 tablet 1   escitalopram (LEXAPRO) 20 MG tablet Take 1 tablet daily 90 tablet 3   Evolocumab (REPATHA SURECLICK) 326 MG/ML SOAJ INJECT 1 PEN INTO SKIN EVERY 14 DAYS 2 mL 11   ezetimibe (ZETIA) 10 MG tablet Take 0.5 tablets (5 mg total) by mouth daily. 45 tablet 3   lisinopril (ZESTRIL) 10 MG tablet Take 1 tablet (10 mg total) by mouth daily. 90 tablet 1   memantine (NAMENDA) 10 MG tablet Take 1 tablet (10 mg total) by mouth at bedtime. 90 tablet 3   metoprolol tartrate (LOPRESSOR) 25 MG tablet Take 1 tablet (25 mg total) by mouth 2 (two) times daily. (Patient taking differently: Take 25 mg by mouth at bedtime.) 180 tablet 3   nitroGLYCERIN (NITROSTAT) 0.4 MG SL tablet ONE TABLET UNDER TONGUE AS NEEDED FOR CHEST PAIN. MAY REPEAT IN 5 MINUTES AND AGAIN IN 5 MINUTES (TOTAL OF 3 TABLETS) 25 tablet 6   omeprazole (PRILOSEC) 40 MG capsule TAKE ONE CAPSULE EACH DAY 90 capsule 3   oxyCODONE-acetaminophen (PERCOCET/ROXICET) 5-325 MG tablet TAKE ONE TABLET EVERY EIGHT HOURS AS NEEDED FOR SEVERE PAIN 90 tablet 0   Propylene Glycol (SYSTANE BALANCE OP) Place 2 drops into both eyes daily as needed (dry eyes).       rosuvastatin (CRESTOR) 5 MG tablet TAKE ONE TABLET BY MOUTH ONCE DAILY 90 tablet 3   valACYclovir (VALTREX) 500 MG tablet Take 1 tablet (500 mg total) by mouth 2 (two) times daily. 14 tablet 1   zolpidem (AMBIEN) 10 MG tablet TAKE 1/2 TO 1 TABLET AT BEDTIME AS NEEDED FOR SLEEP 30 tablet 3   No facility-administered medications prior to visit.    ROS: Review of Systems  Constitutional:  Negative for appetite change, fatigue and unexpected weight change.  HENT:  Negative for congestion, nosebleeds, sneezing, sore throat and trouble swallowing.   Eyes:  Negative for itching and visual disturbance.  Respiratory:  Negative for cough.   Cardiovascular:  Negative for chest pain, palpitations and leg swelling.  Gastrointestinal:  Negative for abdominal distention, blood in stool, diarrhea and nausea.  Genitourinary:  Negative for frequency and hematuria.  Musculoskeletal:  Negative for back pain, gait problem, joint swelling and neck pain.  Skin:  Negative for rash.  Neurological:  Negative for dizziness, tremors, speech difficulty and weakness.  Hematological:  Bruises/bleeds easily.  Psychiatric/Behavioral:  Positive for confusion and decreased concentration. Negative for agitation, behavioral problems, dysphoric mood and sleep disturbance. The patient is not nervous/anxious.    Objective:  BP  100/60 (BP Location: Left Arm, Patient Position: Sitting, Cuff Size: Large)    Pulse 63    Temp 98.6 F (37 C) (Oral)    Ht 6' (1.829 m)    Wt 181 lb (82.1 kg)    SpO2 96%    BMI 24.55 kg/m   BP Readings from Last 3 Encounters:  01/19/22 100/60  12/02/21 (!) 115/55  11/10/21 (!) 148/70    Wt Readings from Last 3 Encounters:  01/19/22 181 lb (82.1 kg)  12/02/21 181 lb (82.1 kg)  11/10/21 185 lb (83.9 kg)    Physical Exam Constitutional:      General: He is not in acute distress.    Appearance: He is well-developed.     Comments: NAD  Eyes:     Conjunctiva/sclera: Conjunctivae normal.      Pupils: Pupils are equal, round, and reactive to light.  Neck:     Thyroid: No thyromegaly.     Vascular: No JVD.  Cardiovascular:     Rate and Rhythm: Normal rate and regular rhythm.     Heart sounds: Normal heart sounds. No murmur heard.   No friction rub. No gallop.  Pulmonary:     Effort: Pulmonary effort is normal. No respiratory distress.     Breath sounds: Normal breath sounds. No wheezing or rales.  Chest:     Chest wall: No tenderness.  Abdominal:     General: Bowel sounds are normal. There is no distension.     Palpations: Abdomen is soft. There is no mass.     Tenderness: There is no abdominal tenderness. There is no guarding or rebound.  Musculoskeletal:        General: Tenderness present. Normal range of motion.     Cervical back: Normal range of motion.  Lymphadenopathy:     Cervical: No cervical adenopathy.  Skin:    General: Skin is warm and dry.     Findings: No rash.  Neurological:     Mental Status: He is alert and oriented to person, place, and time.     Cranial Nerves: No cranial nerve deficit.     Motor: No abnormal muscle tone.     Coordination: Coordination abnormal.     Gait: Gait normal.     Deep Tendon Reflexes: Reflexes are normal and symmetric.  Psychiatric:        Behavior: Behavior normal.        Thought Content: Thought content normal.        Judgment: Judgment normal.     Procedure Note :     Procedure : Joint Injection, L  knee   Indication:  Joint osteoarthritis with refractory  chronic pain.   Risks including unsuccessful procedure , bleeding, infection, bruising, skin atrophy, "steroid flare-up" and others were explained to the patient in detail as well as the benefits. Informed consent was obtained verbally.  Tthe patient was placed in a comfortable position. Lateral approach was used. Skin was prepped with Betadine and alcohol  and anesthetized a cooling spray. Then, a 5 cc syringe with a 1.5 inch long 25-gauge needle was used for a  joint injection.. The needle was advanced  Into the knee joint cavity. I aspirated a small amount of intra-articular fluid to confirm correct placement of the needle and injected the joint with 5 mL of 2% lidocaine and 40 mg of Depo-Medrol .  Band-Aid was applied.   Tolerated well. Complications: None. Good pain relief following the procedure.   Postprocedure instructions :  A Band-Aid should be left on for 12 hours. Injection therapy is not a cure itself. It is used in conjunction with other modalities. You can use nonsteroidal anti-inflammatories like ibuprofen , hot and cold compresses. Rest is recommended in the next 24 hours. You need to report immediately  if fever, chills or any signs of infection develop.   Lab Results  Component Value Date   WBC 6.7 02/15/2021   HGB 13.2 02/15/2021   HCT 39.9 02/15/2021   PLT 304 02/15/2021   GLUCOSE 93 02/15/2021   CHOL 197 08/04/2020   TRIG 254 (H) 08/04/2020   HDL 47 08/04/2020   LDLDIRECT 168.9 05/24/2013   LDLCALC 106 (H) 08/04/2020   ALT 13 02/15/2021   AST 21 02/15/2021   NA 138 02/15/2021   K 3.9 02/15/2021   CL 108 02/15/2021   CREATININE 0.82 02/15/2021   BUN 15 02/15/2021   CO2 25 02/15/2021   TSH 1.18 12/13/2017   PSA 2.46 02/21/2017   INR 0.92 09/13/2016   HGBA1C 6.4 08/30/2017    CT Head Wo Contrast  Result Date: 11/10/2021 CLINICAL DATA:  Head trauma, minor (Age >= 65y) fall on eliquis EXAM: CT HEAD WITHOUT CONTRAST TECHNIQUE: Contiguous axial images were obtained from the base of the skull through the vertex without intravenous contrast. COMPARISON:  04/13/2019 head CT. FINDINGS: Brain: No evidence of parenchymal hemorrhage or extra-axial fluid collection. No mass lesion, mass effect, or midline shift. No CT evidence of acute infarction. Nonspecific prominent subcortical and periventricular white matter hypodensity, most in keeping with chronic small vessel ischemic change. Cerebral volume is age appropriate. No  ventriculomegaly. Vascular: No acute abnormality. Skull: No evidence of calvarial fracture. Sinuses/Orbits: The visualized paranasal sinuses are essentially clear. Other:  The mastoid air cells are unopacified. IMPRESSION: 1. No evidence of acute intracranial abnormality. No evidence of calvarial fracture. 2. Prominent chronic small vessel ischemic changes in the cerebral white matter. Electronically Signed   By: Ilona Sorrel M.D.   On: 11/10/2021 12:16    Assessment & Plan:   Problem List Items Addressed This Visit     Alzheimer's dementia ([place])    No change On  Donepezil       Knee pain, left    Worse Options discussed Percocet prn Will inject the L knee Use a knee brace, trekking pole or poles      LOW BACK PAIN    Cont on Percocet prn - rare  Potential benefits of a long term opioids use as well as potential risks (i.e. addiction risk, apnea etc) and complications (i.e. Somnolence, constipation and others) were explained to the patient and were aknowledged.         No orders of the defined types were placed in this encounter.     Follow-up: Return in about 3 months (around 04/21/2022) for a follow-up visit.  [name], MD

## 2022-02-03 DIAGNOSIS — M0609 Rheumatoid arthritis without rheumatoid factor, multiple sites: Secondary | ICD-10-CM | POA: Diagnosis not present

## 2022-02-03 DIAGNOSIS — Z6825 Body mass index (BMI) 25.0-25.9, adult: Secondary | ICD-10-CM | POA: Diagnosis not present

## 2022-02-03 DIAGNOSIS — E663 Overweight: Secondary | ICD-10-CM | POA: Diagnosis not present

## 2022-02-03 DIAGNOSIS — M5030 Other cervical disc degeneration, unspecified cervical region: Secondary | ICD-10-CM | POA: Diagnosis not present

## 2022-02-03 DIAGNOSIS — M25562 Pain in left knee: Secondary | ICD-10-CM | POA: Diagnosis not present

## 2022-02-04 ENCOUNTER — Telehealth: Payer: Medicare Other

## 2022-02-04 DIAGNOSIS — M0609 Rheumatoid arthritis without rheumatoid factor, multiple sites: Secondary | ICD-10-CM | POA: Diagnosis not present

## 2022-02-08 ENCOUNTER — Other Ambulatory Visit: Payer: Self-pay | Admitting: Internal Medicine

## 2022-02-08 DIAGNOSIS — F4321 Adjustment disorder with depressed mood: Secondary | ICD-10-CM

## 2022-02-08 MED ORDER — OLANZAPINE 2.5 MG PO TABS: 2.5000 mg | ORAL_TABLET | Freq: Every day | ORAL | 2 refills | Status: DC

## 2022-02-08 NOTE — Progress Notes (Signed)
Anger outbursts - worse ?Will Rx Zyprexa low dose - start 1/2 tab  ?Risks discussed w/[name] ?

## 2022-02-08 NOTE — Assessment & Plan Note (Signed)
Anger outbursts - worse ?Will Rx Zyprexa low dose - start 1/2 tab  ?Risks discussed w/[name] ?

## 2022-02-10 DIAGNOSIS — E663 Overweight: Secondary | ICD-10-CM | POA: Diagnosis not present

## 2022-02-10 DIAGNOSIS — M5030 Other cervical disc degeneration, unspecified cervical region: Secondary | ICD-10-CM | POA: Diagnosis not present

## 2022-02-10 DIAGNOSIS — Z6825 Body mass index (BMI) 25.0-25.9, adult: Secondary | ICD-10-CM | POA: Diagnosis not present

## 2022-02-10 DIAGNOSIS — M25562 Pain in left knee: Secondary | ICD-10-CM | POA: Diagnosis not present

## 2022-02-10 DIAGNOSIS — M0609 Rheumatoid arthritis without rheumatoid factor, multiple sites: Secondary | ICD-10-CM | POA: Diagnosis not present

## 2022-02-11 ENCOUNTER — Other Ambulatory Visit: Payer: Self-pay | Admitting: Internal Medicine

## 2022-02-13 ENCOUNTER — Other Ambulatory Visit: Payer: Self-pay | Admitting: Neurology

## 2022-02-13 ENCOUNTER — Other Ambulatory Visit: Payer: Self-pay | Admitting: Internal Medicine

## 2022-02-16 DIAGNOSIS — L570 Actinic keratosis: Secondary | ICD-10-CM | POA: Diagnosis not present

## 2022-02-16 DIAGNOSIS — L821 Other seborrheic keratosis: Secondary | ICD-10-CM | POA: Diagnosis not present

## 2022-02-16 DIAGNOSIS — Z85828 Personal history of other malignant neoplasm of skin: Secondary | ICD-10-CM | POA: Diagnosis not present

## 2022-02-17 ENCOUNTER — Other Ambulatory Visit: Payer: Self-pay | Admitting: Internal Medicine

## 2022-02-17 ENCOUNTER — Other Ambulatory Visit: Payer: Self-pay

## 2022-02-17 MED ORDER — DONEPEZIL HCL 5 MG PO TABS: ORAL_TABLET | ORAL | 0 refills | Status: DC

## 2022-02-18 ENCOUNTER — Telehealth: Payer: Self-pay | Admitting: Internal Medicine

## 2022-02-18 NOTE — Telephone Encounter (Signed)
PT's spouse visits today regarding their medication. Both PT and PT's spouse have been prescribed zolpidem (AMBIEN) and have been attempting to get a new prescription in at their pharmacy [facility]). I had informed the PT's wife and the pharmacy that we have received the request for the PT (currenlty in efax folder under J.Quesnell) but not for her. Will be on the look out for the other request in the efax.  ? ?CB: [phone] ?

## 2022-02-20 MED ORDER — ZOLPIDEM TARTRATE 10 MG PO TABS: ORAL_TABLET | ORAL | 3 refills | Status: DC

## 2022-02-20 NOTE — Telephone Encounter (Signed)
Okay.  Thanks.

## 2022-02-25 ENCOUNTER — Other Ambulatory Visit: Payer: Self-pay | Admitting: Internal Medicine

## 2022-02-25 MED ORDER — DONEPEZIL HCL 5 MG PO TABS: ORAL_TABLET | ORAL | 11 refills | Status: DC

## 2022-03-10 DIAGNOSIS — M0609 Rheumatoid arthritis without rheumatoid factor, multiple sites: Secondary | ICD-10-CM | POA: Diagnosis not present

## 2022-04-01 ENCOUNTER — Telehealth: Payer: Medicare Other

## 2022-04-01 NOTE — Progress Notes (Incomplete)
Chronic Care Management Pharmacy Note  04/01/2022 Name:  [name] MRN:  [id number] DOB:  Aug 16, 1933  Summary: -Pt's wife reports pt is doing very well; pill packs from [facility] have helped a lot  Recommendations/Changes made from today's visit: -Coordinated with cardiology and patient's previous pharmacy for refills to ensure no interruption in pill pack delivery -No med changes   Subjective: [name] is an 88 y.o. year old male who is a primary patient of Plotnikov, Evie Lacks, MD.  The CCM team was consulted for assistance with disease management and care coordination needs.    Engaged with patient face to face for follow up visit in response to provider referral for pharmacy case management and/or care coordination services.   Consent to Services:  The patient was given information about Chronic Care Management services, agreed to services, and gave verbal consent prior to initiation of services.  Please see initial visit note for detailed documentation.   Patient Care Team: Plotnikov, Evie Lacks, MD as PCP - General Sueanne Margarita, MD as PCP - Cardiology (Cardiology) Thompson Grayer, MD as PCP - Electrophysiology (Cardiology) Irene Shipper, MD (Gastroenterology) Thompson Grayer, MD (Cardiology) Hennie Duos, MD as Consulting Physician (Rheumatology) Alexis Frock, MD as Consulting Physician (Urology) Katy Apo, MD as Consulting Physician (Ophthalmology) Mathis Fare (Dentistry) Tiajuana Amass, MD as Referring Physician (Allergy and Immunology) Cameron Sprang, MD as Consulting Physician (Neurology) Charlton Haws, [facility] as Pharmacist (Pharmacist)   Patient has dementia and is represented by his caregiver and wife in visit today. They recently moved to an independent living community. His wife [name] takes care of his medications and appointments.  Recent office visits: 01/19/2022 - Dr. [name]  - steroid injection in L Knee given  12/02/2021 - Dr.  [name] - dizziness / recent falls - hold lisinopril in setting of hypotension - f/u in 6 weeks   Recent consult visits: 10/07/2021 - Barrington Ellison PA-C - Cardiology - no changes to medications - f/u in 12 months  10/05/2021 - Sharene Butters PA-C - Neurology - no changes to medications - f/u in 6 months   Hospital visits 11/10/2021 - ED Visit - fall - CT negative for acute process - no changes to medications on discharge   Objective:  Lab Results  Component Value Date   CREATININE 0.82 02/15/2021   BUN 15 02/15/2021   GFR 82.58 04/06/2019   GFRNONAA >60 02/15/2021   GFRAA >60 03/14/2020   NA 138 02/15/2021   K 3.9 02/15/2021   CALCIUM 9.2 02/15/2021   CO2 25 02/15/2021   GLUCOSE 93 02/15/2021    Lab Results  Component Value Date/Time   HGBA1C 6.4 08/30/2017 07:24 AM   HGBA1C 6.8 (H) 06/09/2017 11:55 AM   GFR 82.58 04/06/2019 11:58 AM   GFR 79.61 02/07/2018 08:39 AM    Last diabetic Eye exam: No results found for: HMDIABEYEEXA  Last diabetic Foot exam: No results found for: HMDIABFOOTEX   Lab Results  Component Value Date   CHOL 197 08/04/2020   HDL 47 08/04/2020   LDLCALC 106 (H) 08/04/2020   LDLDIRECT 168.9 05/24/2013   TRIG 254 (H) 08/04/2020   CHOLHDL 4.2 08/04/2020       Latest Ref Rng & Units 02/15/2021   11:56 AM 08/31/2020    3:44 PM 03/03/2020   10:36 AM  Hepatic Function  Total Protein 6.5 - 8.1 g/dL 7.3   6.8   7.0    Albumin 3.5 - 5.0  g/dL 3.7   3.7   4.0    AST 15 - 41 U/L '21   22   13    ' ALT 0 - 44 U/L '13   14   11    ' Alk Phosphatase 38 - 126 U/L 59   54   69    Total Bilirubin 0.3 - 1.2 mg/dL 0.8   1.0   0.4    Bilirubin, Direct 0.0 - 0.2 mg/dL  0.1   0.10      Lab Results  Component Value Date/Time   TSH 1.18 12/13/2017 08:49 AM   TSH 1.015 10/07/2015 09:19 AM       Latest Ref Rng & Units 02/15/2021   11:56 AM 08/31/2020    1:01 PM 03/14/2020   10:56 AM  CBC  WBC 4.0 - 10.5 K/uL 6.7   7.6   8.6    Hemoglobin 13.0 - 17.0 g/dL 13.2    14.1   13.8    Hematocrit 39.0 - 52.0 % 39.9   43.4   42.3    Platelets 150 - 400 K/uL 304   302   299      Lab Results  Component Value Date/Time   VD25OH 48 04/13/2012 07:38 AM   CHA2DS2-VASc Score =    The patient's score is based upon:       Clinical ASCVD: Yes  The ASCVD Risk score (Arnett DK, et al., 2019) failed to calculate for the following reasons:   The 2019 ASCVD risk score is only valid for ages 63 to 34   The patient has a prior MI or stroke diagnosis       06/17/2021   11:44 AM 06/11/2020   10:15 AM 11/23/2018   10:05 AM  Depression screen PHQ 2/9  Decreased Interest 0 0 0  Down, Depressed, Hopeless 0 0 0  PHQ - 2 Score 0 0 0      Social History   Tobacco Use  Smoking Status Former   Years: 10.00   Types: Cigarettes   Quit date: 08/09/1968   Years since quitting: 53.6  Smokeless Tobacco Never   BP Readings from Last 3 Encounters:  01/19/22 100/60  12/02/21 (!) 115/55  11/10/21 (!) 148/70   Pulse Readings from Last 3 Encounters:  01/19/22 63  12/02/21 69  11/10/21 68   Wt Readings from Last 3 Encounters:  01/19/22 181 lb (82.1 kg)  12/02/21 181 lb (82.1 kg)  11/10/21 185 lb (83.9 kg)   BMI Readings from Last 3 Encounters:  01/19/22 24.55 kg/m  12/02/21 24.55 kg/m  11/10/21 25.09 kg/m    Assessment/Interventions: Review of patient past medical history, allergies, medications, health status, including review of consultants reports, laboratory and other test data, was performed as part of comprehensive evaluation and provision of chronic care management services.   SDOH:  (Social Determinants of Health) assessments and interventions performed: Yes  SDOH Screenings   Alcohol Screen: Low Risk    Last Alcohol Screening Score (AUDIT): 3  Depression (PHQ2-9): Low Risk    PHQ-2 Score: 0  Financial Resource Strain: Low Risk    Difficulty of Paying Living Expenses: Not hard at all  Food Insecurity: No Food Insecurity   Worried About Paediatric nurse in the Last Year: Never true   Ran Out of Food in the Last Year: Never true  Housing: Low Risk    Last Housing Risk Score: 0  Physical Activity: Sufficiently Active   Days of  Exercise per Week: 5 days   Minutes of Exercise per Session: 30 min  Social Connections: Socially Integrated   Frequency of Communication with Friends and Family: More than three times a week   Frequency of Social Gatherings with Friends and Family: More than three times a week   Attends Religious Services: More than 4 times per year   Active Member of [employer] or Organizations: Yes   Attends Music therapist: More than 4 times per year   Marital Status: Married  Stress: No Stress Concern Present   Feeling of Stress : Not at all  Tobacco Use: Medium Risk   Smoking Tobacco Use: Former   Smokeless Tobacco Use: Never   Passive Exposure: Not on P[employer] consultant Needs: No Transportation Needs   Lack of Transportation (Medical): No   Lack of Transportation (Non-Medical): No    CCM Care Plan  Allergies  Allergen Reactions   Methotrexate Other (See Comments)    REACTION: tachycardia   Aricept [Donepezil Hcl]     nightmares    Crestor [Rosuvastatin Calcium]     Myalgias with 67m dose   Lisinopril Other (See Comments)    cough   Namenda [Memantine]     n/d   Atorvastatin Other (See Comments)    Muscle pain, leg cramps   Pravastatin Sodium Other (See Comments)    REACTION: cramps, fatigue    Medications Reviewed Today     Reviewed by PCassandria Anger MD (Physician) on 01/19/22 at 1528  Med List Status: <None>   Medication Order Taking? Sig Documenting Provider Last Dose Status Informant  abatacept (ORENCIA) 250 MG injection [id number] every 30 (thirty) days. [provider] Taking Active   acetaminophen (TYLENOL) 500 MG tablet [id number] Take 1,000 mg by mouth every 6 (six) hours as needed. [provider] Taking Active Self  acidophilus (RISAQUAD)  CAPS capsule [id number] Take 1 capsule by mouth daily. [provider] Taking Active   aspirin EC 81 MG tablet [id number] Take 81 mg by mouth daily. [provider] Taking Active Self  donepezil (ARICEPT) 10 MG tablet [id number] Take 1/2 tablet daily ACameron Sprang MD Taking Active   ELIQUIS 5 MG TABS tablet [id number] TAKE ONE TABLET BY MOUTH EVERY MORNING and TAKE ONE TABLET BY MOUTH EVERYDAY AT BEDTIME TSueanne Margarita MD Taking Active   escitalopram (LEXAPRO) 20 MG tablet [id number] Take 1 tablet daily Plotnikov, AEvie Lacks MD Taking Active   Evolocumab (REPATHA SURECLICK) 1428MG/ML SDarden [id number] INJECT 1 PEN INTO SKIN EVERY 14 DAYS Turner, TEber Hong MD Taking Active   ezetimibe (ZETIA) 10 MG tablet [id number] Take 0.5 tablets (5 mg total) by mouth daily. Plotnikov, AEvie Lacks MD Taking Active   lisinopril (ZESTRIL) 10 MG tablet [id number] Take 1 tablet (10 mg total) by mouth daily. TSueanne Margarita MD Taking Active   memantine ([facility] 10 MG tablet [id number] Take 1 tablet (10 mg total) by mouth at bedtime. ACameron Sprang MD Taking Active   metoprolol tartrate (LOPRESSOR) 25 MG tablet [id number] Take 1 tablet (25 mg total) by mouth 2 (two) times daily.  Patient taking differently: Take 25 mg by mouth at bedtime.   AThompson Grayer MD Taking Active   nitroGLYCERIN (NITROSTAT) 0.4 MG SL tablet [id number] ONE TABLET UNDER TONGUE AS NEEDED FOR CHEST PAIN. MAY REPEAT IN 5 MINUTES AND AGAIN IN 5 MINUTES (TOTAL OF 3 TABLETS) AThompson Grayer MD  Taking Active Self  omeprazole (PRILOSEC) 40 MG capsule [id number] Yes TAKE ONE CAPSULE EACH DAY Plotnikov, Evie Lacks, MD Taking Active   oxyCODONE-acetaminophen (PERCOCET/ROXICET) 5-325 MG tablet [id number] Yes TAKE ONE TABLET EVERY EIGHT HOURS AS NEEDED FOR SEVERE PAIN Plotnikov, Evie Lacks, MD Taking Active   Propylene Glycol (SYSTANE BALANCE OP) [id number] Yes Place 2 drops into both eyes daily as needed (dry  eyes).  [provider] Taking Active Self  rosuvastatin (CRESTOR) 5 MG tablet [id number] Yes TAKE ONE TABLET BY MOUTH ONCE DAILY Turner, Eber Hong, MD Taking Active   valACYclovir (VALTREX) 500 MG tablet [id number] Yes Take 1 tablet (500 mg total) by mouth 2 (two) times daily. Plotnikov, Evie Lacks, MD Taking Active   zolpidem (AMBIEN) 10 MG tablet [id number] Yes TAKE 1/2 TO 1 TABLET AT BEDTIME AS NEEDED FOR SLEEP Plotnikov, Evie Lacks, MD Taking Active             Patient Active Problem List   Diagnosis Date Noted   DDD (degenerative disc disease), cervical 01/19/2022   Knee pain, left 01/19/2022   Dizzinesses 12/02/2021   Atherosclerosis of aorta ([place]) 03/02/2021   COVID-19 virus infection 11/15/2020   Weight loss 09/29/2020   Near syncope 09/04/2020   Chest pain    Dyspepsia    Encounter for medication management 11/14/2019   Angular stomatitis 07/09/2019   Cervical radiculopathy 06/05/2019   Pain in joint of left shoulder 05/31/2019   Concussion 04/17/2019   Hemoptysis 04/05/2019   Confusion 04/05/2019   Alzheimer's dementia ([place]) 12/21/2018   Nausea & vomiting 02/07/2018   Chills 02/07/2018   Rash 02/07/2018   Insomnia 09/22/2017   Cervical spondylolysis 06/28/2017   Hyperglycemia 06/22/2017   Well adult exam 11/18/2016   Hydronephrosis 11/18/2016   UPJ obstruction, 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   Allergic rhinitis 06/02/2016   Asthma with exacerbation 06/02/2016   Sinusitis, chronic 10/17/2015   OSA (obstructive sleep apnea) 06/19/2015   Cellulitis and abscess of leg, except foot 05/26/2015   Paroxysmal atrial fibrillation ([place]) 02/21/2015   CAP (community acquired pneumonia) 01/02/2015   Upper respiratory infection 12/02/2014   Vivid dream 09/25/2014   Actinic keratoses 04/26/2014   CAD (coronary artery disease) 02/25/2014   STEMI (ST elevation myocardial infarction) ([place]) 01/08/2014   Rheumatoid bursitis of left elbow ([place]) 08/08/2013   Situational depression  06/18/2013   Neck pain 04/23/2012   Acute abdominal pain in left flank 02/14/2012   Hypertension 09/23/2011   Chest pain, midsternal 09/13/2011   Mixed hyperlipidemia 07/23/2010   Palpitations 07/07/2010   Rheumatoid arthritis ([place]) 06/04/2010   PARESTHESIA 06/04/2010   Pain in joint 11/27/2009   MYALGIA 08/21/2009   ALLERGIC RHINITIS 06/11/2008   OSTEOARTHRITIS 06/11/2008   FATIGUE 06/11/2008   LOW BACK PAIN 07/27/2007   COLONIC POLYPS, HX OF 07/27/2007    Immunization History  Administered Date(s) Administered   Fluad Quad(high Dose 65+) 07/09/2019, 08/15/2020, 09/16/2021   Influenza Split 08/10/2011, 08/15/2012   Influenza Whole 08/21/2009, 09/22/2010   Influenza, High Dose Seasonal PF 09/16/2015, 08/18/2016, 08/31/2017, 08/14/2018   Influenza,inj,Quad PF,6+ Mos 08/06/2013   Influenza-Unspecified 08/31/2017   PFIZER(Purple Top)SARS-COV-2 Vaccination 12/11/2019, 01/01/2020, 08/12/2020   Pneumococcal Conjugate-13 10/02/2013   Pneumococcal Polysaccharide-23 06/04/2010   Tdap 10/22/2011   Zoster Recombinat (Shingrix) 03/09/2017, 05/10/2017    Conditions to be addressed/monitored:  Hypertension, Hyperlipidemia, Atrial Fibrillation, GERD and Depression, Dementia  There are no care plans that you recently modified to display for this patient.  Care Gaps: COVID booster  Tdap  Medication Assistance:  [place] through 02/26/2022  Patient's preferred pharmacy is:  [facility] - [place], [place] - [address] [place] [address] Phone: [phone] Fax: [phone]  [place] [facility] [place] [address] Phone: [phone] Fax: [place], [place] - [address] [place] [address] Phone: [phone] Fax: [phone]   Uses pill box? Yes Pt endorses 100%  compliance  Care Plan and Follow Up Patient Decision:  Patient agrees to Care Plan and Follow-up.  Plan: Telephone follow up appointment with care management team member scheduled for:  6 months  ***

## 2022-04-05 ENCOUNTER — Ambulatory Visit: Payer: Medicare Other | Admitting: Neurology

## 2022-04-05 ENCOUNTER — Ambulatory Visit (INDEPENDENT_AMBULATORY_CARE_PROVIDER_SITE_OTHER): Payer: Medicare Other | Admitting: Physician Assistant

## 2022-04-05 ENCOUNTER — Encounter: Payer: Self-pay | Admitting: Physician Assistant

## 2022-04-05 VITALS — BP 132/82 | HR 84 | Resp 18 | Ht 72.0 in | Wt 184.0 lb

## 2022-04-05 DIAGNOSIS — F0150 Vascular dementia without behavioral disturbance: Secondary | ICD-10-CM

## 2022-04-05 MED ORDER — MEMANTINE HCL 10 MG PO TABS: 10.0000 mg | ORAL_TABLET | Freq: Every day | ORAL | 3 refills | Status: DC

## 2022-04-05 NOTE — Patient Instructions (Addendum)
It was a pleasure to see you today at our office.   Recommendations:  Meds:Continue taking  donepezil 5 mg daily            Continue Memantine  '10mg'$  tablets at night.                Follow up in 6  months            Please wear your hering aids  It was a pleasure to see you today at our office.     Whom to call:  Memory  decline, memory medications: Call out office [phone]   For psychiatric meds, mood meds: Please have your primary care physician manage these medications.   Counseling regarding caregiver distress, including caregiver depression, anxiety and issues regarding community resources, adult day care programs, adult living facilities, or memory care questions:   Feel free to contact [place], Social Worker at [phone]   For assessment of decision of mental capacity and competency:  Call Dr. Anthoney Harada, geriatric psychiatrist at [phone]   If you have any severe symptoms of a stroke, or other severe issues such as confusion,severe chills or fever, etc call 911 or go to the ER as you may need to be evaluate further    RECOMMENDATIONS FOR ALL PATIENTS WITH MEMORY PROBLEMS: 1. Continue to exercise (Recommend 30 minutes of walking everyday, or 3 hours every week) 2. Increase social interactions - continue going to [place] and enjoy social gatherings with friends and family 3. Eat healthy, avoid fried foods and eat more fruits and vegetables 4. Maintain adequate blood pressure, blood sugar, and blood cholesterol level. Reducing the risk of stroke and cardiovascular disease also helps promoting better memory. 5. Avoid stressful situations. Live a simple life and avoid aggravations. Organize your time and prepare for the next day in anticipation. 6. Sleep well, avoid any interruptions of sleep and avoid any distractions in the bedroom that may interfere with adequate sleep quality 7. Avoid sugar, avoid sweets as there is a strong link between excessive  sugar intake, diabetes, and cognitive impairment We discussed the Mediterranean diet, which has been shown to help patients reduce the risk of progressive memory disorders and reduces cardiovascular risk. This includes eating fish, eat fruits and green leafy vegetables, nuts like almonds and hazelnuts, walnuts, and also use olive oil. Avoid fast foods and fried foods as much as possible. Avoid sweets and sugar as sugar use has been linked to worsening of memory function.  There is always a concern of gradual progression of memory problems. If this is the case, then we may need to adjust level of care according to patient needs. Support, both to the patient and caregiver, should then be put into place.    The Alzheimer's Association is here all day, every day for people facing Alzheimer's disease through our free 24/7 Helpline: [phone]. The Helpline provides reliable information and support to all those who need assistance, such as individuals living with memory loss, Alzheimer's or other dementia, caregivers, health care professionals and the public.  Our highly trained and knowledgeable staff can help you with: Understanding memory loss, dementia and Alzheimer's  Medications and other treatment options  General information about aging and brain health  Skills to provide quality care and to find the best care from professionals  Legal, financial and living-arrangement decisions Our Helpline also features: Confidential care consultation provided by master's level clinicians who can help with decision-making support, crisis assistance and education  on issues families face every day  Help in a caller's preferred language using our translation service that features more than 200 languages and dialects  Referrals to local community programs, services and ongoing support     FALL PRECAUTIONS: Be cautious when walking. Scan the area for obstacles that may increase the risk of trips and falls. When  getting up in the mornings, sit up at the edge of the bed for a few minutes before getting out of bed. Consider elevating the bed at the head end to avoid drop of blood pressure when getting up. Walk always in a well-lit room (use night lights in the walls). Avoid area rugs or power cords from appliances in the middle of the walkways. Use a walker or a cane if necessary and consider physical therapy for balance exercise. Get your eyesight checked regularly.  FINANCIAL OVERSIGHT: Supervision, especially oversight when making financial decisions or transactions is also recommended.  HOME SAFETY: Consider the safety of the kitchen when operating appliances like stoves, microwave oven, and blender. Consider having supervision and share cooking responsibilities until no longer able to participate in those. Accidents with firearms and other hazards in the house should be identified and addressed as well.   ABILITY TO BE LEFT ALONE: If patient is unable to contact 911 operator, consider using LifeLine, or when the need is there, arrange for someone to stay with patients. Smoking is a fire hazard, consider supervision or cessation. Risk of wandering should be assessed by caregiver and if detected at any point, supervision and safe proof recommendations should be instituted.  MEDICATION SUPERVISION: Inability to self-administer medication needs to be constantly addressed. Implement a mechanism to ensure safe administration of the medications.   DRIVING: Regarding driving, in patients with progressive memory problems, driving will be impaired. We advise to have someone else do the driving if trouble finding directions or if minor accidents are reported. Independent driving assessment is available to determine safety of driving.   If you are interested in the driving assessment, you can contact the following:  [employer] in [place]  [place]  [place] [phone] or [phone]      [place] refers to food and lifestyle choices that are based on the traditions of countries located on the [employer]. This way of eating has been shown to help prevent certain conditions and improve outcomes for people who have chronic diseases, like kidney disease and heart disease. What are tips for following this plan? Lifestyle  Cook and eat meals together with your family, when possible. Drink enough fluid to keep your urine clear or pale yellow. Be physically active every day. This includes: Aerobic exercise like running or swimming. Leisure activities like gardening, walking, or housework. Get 7-8 hours of sleep each night. If recommended by your health care provider, drink red wine in moderation. This means 1 glass a day for nonpregnant women and 2 glasses a day for men. A glass of wine equals 5 oz (150 mL). Reading food labels  Check the serving size of packaged foods. For foods such as rice and pasta, the serving size refers to the amount of cooked product, not dry. Check the total fat in packaged foods. Avoid foods that have saturated fat or trans fats. Check the ingredients list for added sugars, such as corn syrup. Shopping  At the grocery store, buy most of your food from the areas near  the walls of the store. This includes: Fresh fruits and vegetables (produce). Grains, beans, nuts, and seeds. Some of these may be available in unpackaged forms or large amounts (in bulk). Fresh seafood. Poultry and eggs. Low-fat dairy products. Buy whole ingredients instead of prepackaged foods. Buy fresh fruits and vegetables in-season from local farmers markets. Buy frozen fruits and vegetables in resealable bags. If you do not have access to quality fresh seafood, buy precooked frozen shrimp or canned fish, such as tuna, salmon, or sardines. Buy  small amounts of raw or cooked vegetables, salads, or olives from the deli or salad bar at your store. Stock your pantry so you always have certain foods on hand, such as olive oil, canned tuna, canned tomatoes, rice, pasta, and beans. Cooking  Cook foods with extra-virgin olive oil instead of using butter or other vegetable oils. Have meat as a side dish, and have vegetables or grains as your main dish. This means having meat in small portions or adding small amounts of meat to foods like pasta or stew. Use beans or vegetables instead of meat in common dishes like chili or lasagna. Experiment with different cooking methods. Try roasting or broiling vegetables instead of steaming or sauteing them. Add frozen vegetables to soups, stews, pasta, or rice. Add nuts or seeds for added healthy fat at each meal. You can add these to yogurt, salads, or vegetable dishes. Marinate fish or vegetables using olive oil, lemon juice, garlic, and fresh herbs. Meal planning  Plan to eat 1 vegetarian meal one day each week. Try to work up to 2 vegetarian meals, if possible. Eat seafood 2 or more times a week. Have healthy snacks readily available, such as: Vegetable sticks with hummus. Greek yogurt. Fruit and nut trail mix. Eat balanced meals throughout the week. This includes: Fruit: 2-3 servings a day Vegetables: 4-5 servings a day Low-fat dairy: 2 servings a day Fish, poultry, or lean meat: 1 serving a day Beans and legumes: 2 or more servings a week Nuts and seeds: 1-2 servings a day Whole grains: 6-8 servings a day Extra-virgin olive oil: 3-4 servings a day Limit red meat and sweets to only a few servings a month What are my food choices? Mediterranean diet Recommended Grains: Whole-grain pasta. Brown rice. Bulgar wheat. Polenta. Couscous. Whole-wheat bread. [place]. Vegetables: Artichokes. Beets. Broccoli. Cabbage. Carrots. Eggplant. Green beans. Chard. Kale. Spinach. Onions. Leeks. Peas.  Squash. Tomatoes. Peppers. Radishes. Fruits: Apples. Apricots. Avocado. Berries. Bananas. Cherries. Dates. Figs. Grapes. Lemons. Melon. Oranges. Peaches. Plums. Pomegranate. Meats and other protein foods: Beans. Almonds. Sunflower seeds. Pine nuts. Peanuts. [place]. Salmon. Scallops. Shrimp. [place]. Tilapia. Clams. Oysters. Eggs. Dairy: Low-fat milk. Cheese. Greek yogurt. Beverages: Water. Red wine. Herbal tea. Fats and oils: Extra virgin olive oil. Avocado oil. Grape seed oil. Sweets and desserts: [place] yogurt with honey. Baked apples. Poached pears. Trail mix. Seasoning and other foods: Basil. Cilantro. Coriander. Cumin. Mint. Parsley. Sage. Rosemary. Tarragon. Garlic. Oregano. Thyme. Pepper. Balsalmic vinegar. Tahini. Hummus. Tomato sauce. Olives. Mushrooms. Limit these Grains: Prepackaged pasta or rice dishes. Prepackaged cereal with added sugar. Vegetables: Deep fried potatoes (french fries). Fruits: Fruit canned in syrup. Meats and other protein foods: Beef. Pork. Lamb. Poultry with skin. Hot dogs. Bernie[place] Salines. Dairy: Ice cream. Sour cream. Whole milk. Beverages: Juice. Sugar-sweetened soft drinks. Beer. Liquor and spirits. Fats and oils: Butter. Canola oil. Vegetable oil. Beef fat (tallow). Lard. Sweets and desserts: Cookies. Cakes. Pies. Candy. Seasoning and other foods: Mayonnaise. Premade sauces and marinades. The  items listed may not be a complete list. Talk with your dietitian about what dietary choices are right for you. Summary The Mediterranean diet includes both food and lifestyle choices. Eat a variety of fresh fruits and vegetables, beans, nuts, seeds, and whole grains. Limit the amount of red meat and sweets that you eat. Talk with your health care provider about whether it is safe for you to drink red wine in moderation. This means 1 glass a day for nonpregnant women and 2 glasses a day for men. A glass of wine equals 5 oz (150 mL). This information is not intended to replace advice  given to you by your health care provider. Make sure you discuss any questions you have with your health care provider. Document Released: 06/24/2016 Document Revised: 07/27/2016 Document Reviewed: 06/24/2016 Elsevier Interactive Patient Education  2017 [employer].

## 2022-04-05 NOTE — Progress Notes (Signed)
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  He is currently on donepezil 5 mg daily (he could not tolerate the full dose) as well as  memantine 10 mg daily also due to side effects. His memory is stable, without worsening signs. His mood is controlled by Lexapro by PCP   Recommendations:    Continue Donepezil 5 mg daily and Memantine 10 mg daily  Side effects were discussed Follow up in 6 months. Control your cardiovascular risk factors  Control mood by PCP   Case discussed with Dr. Delice Lesch who agrees with the plan     Subjective:    [name] is a very pleasant 88 y.o. RH male  seen today in follow up for memory loss. This patient is accompanied in the office by his who supplements the history.  Previous records as well as any outside records available were reviewed prior to todays visit.  Patient was last seen at our office on  09/2021 at which time his MMSE was 17/30 .Patient is currently on  He is currently on donepezil 5 mg daily (he could not tolerate the full dose) as well as  memantine 10 mg daily also due to side effects.  Any changes in memory since last visit? "Memory is about the same", reads books about Founding Father's history. Doe not do crossword puzzles "I should".   He also likes to sing at [facility] Patient lives with: Spouse  at [employer], enjoys wood working classes and chair fit classes   repeats oneself? He repeats himself asking appointments, may tell the same story from time to time  Disoriented when walking into a room?  Patient denies   Leaving objects in unusual places?  Patient denies   Ambulates  with difficulty?   Patient denies . He does chair exercises at [employer] . He also likes to walk his dog.   Recent falls?  Patient  denies   Any head injuries?  Patient denies   History of seizures?   Patient denies   Wandering behavior?  Patient denies.     Patient drives?   Patient no longer drives, wife does the driving  Any mood changes such irritability agitation?  Patient denies  after being on Lexapro 20 mg daily  Any history of depression?:  Patient denies   Hallucinations?  Patient denies   Paranoia?  Patient denies   Patient reports that he sleeps well without vivid dreams, REM behavior or sleepwalking  He takes melatonin for sleeping  History of sleep apnea?  Patient denies   Any hygiene concerns?  Patient denies   Independent of bathing and dressing?  Endorsed  Does the patient needs help with medications?  Wife is in charge  Who is in charge of the finances? Wife is in charge  Any changes in appetite?  Patient denies   Patient have trouble swallowing? Patient denies   Does the patient cook?  Patient denies   Any kitchen accidents such as leaving the stove on? Patient denies   Any headaches?  Patient denies   The double vision? Patient denies   Any focal numbness or tingling?  Patient denies   Chronic back pain Patient denies   Unilateral weakness?  Patient denies   Any tremors?  Patient denies   Any history of anosmia?  Patient denies   Any incontinence of urine?  Patient denies   Any bowel dysfunction?   Patient denies  History on Initial Assessment 04/11/2019: This is an 88 year old right-handed man with a history of hypertension, hyperlipidemia, OSA on CPAP, CAD, presenting for evaluation of worsening memory. He feels his memory is okay, he gets confused some, "my wife tells me I forget things." He makes jokes several times during the visit. His wife started noticing changes the beginning of the year. He started having personality changes where he would just explode for no reason. He was started on Lexapro which has significantly helped. She has noticed short term memory issues, and was missing bills  that she had to take over in January. He was also starting to miss medications, his wife has started helping. Last night he was very concerned that he had not taken his medications. He has gotten lost driving, last week he could not remember what errand he went to do and was driving around. He could not remember where he would be going, but then figures it out. He repeatedly says he misplaced his hearing aids. His wife was driving yesterday and they brought her car to be serviced, after going to another place, he could not remember that they had brought her car in earlier. He had an unusual episode last week when he came upstairs and told his wife that they had to go and leave now, he was dressed in 2 pairs of boxer shorts and his shirt on backward (unusual, he is usually a fastidious dresser per wife), ready to go somewhere late at night. She found that he had pulled out his clothes and medications and wrapped his medications in his khaki pants. She directed him to bed and he went to sleep. He saw his PCP and had normal bloodwork and urinalysis. I personally reviewed head CT without contrast done 5/26 which did not show any acute changes, there was diffuse atrophy and chronic microvascular disease. His wife notes he is much more alert in the daytime and more confused at night. He was started on Donepezil '5mg'$  daily, which he is tolerating without side effects. There is no family history of dementia, no history of significant head injuries. He usually has 1-2 alcohol drinks at night.    He denies any headaches, dizziness (except upon standing quickly), diplopia, dysarthria/dysphagia, neck/back pain, focal numbness/tingling/weakness, bowel dysfunction, anosmia. He has infrequent incontinence. He has had right hand shaking occasionally around 3-4 times a week. He has a history of sleepwalking. His wife has not noticed any staring/unresponsive episodes. No paranoia or hallucinations. He has trouble sleeping and takes  1/2 tablet of Ambien. He has fallen a couple of times, last fall was 3 weeks ago.   PREVIOUS MEDICATIONS:   CURRENT MEDICATIONS:  Outpatient Encounter Medications as of 04/05/2022  Medication Sig   abatacept (ORENCIA) 250 MG injection every 30 (thirty) days.   acetaminophen (TYLENOL) 500 MG tablet Take 1,000 mg by mouth every 6 (six) hours as needed.   acidophilus (RISAQUAD) CAPS capsule Take 1 capsule by mouth daily.   aspirin EC 81 MG tablet Take 81 mg by mouth daily.   donepezil (ARICEPT) 5 MG tablet TAKE ONE TABLET BY MOUTH EVERY MORNING   ELIQUIS 5 MG TABS tablet TAKE ONE TABLET BY MOUTH EVERY MORNING and TAKE ONE TABLET BY MOUTH EVERYDAY AT BEDTIME   escitalopram (LEXAPRO) 20 MG tablet TAKE ONE TABLET BY MOUTH ONCE DAILY   Evolocumab (REPATHA SURECLICK) 751  MG/ML SOAJ INJECT 1 PEN INTO SKIN EVERY 14 DAYS   ezetimibe (ZETIA) 10 MG tablet TAKE 1/2 TABLET BY MOUTH ONCE DAILY   lisinopril (ZESTRIL) 10 MG tablet Take 1 tablet (10 mg total) by mouth daily.   memantine (NAMENDA) 10 MG tablet Take 1 tablet (10 mg total) by mouth at bedtime.   metoprolol tartrate (LOPRESSOR) 25 MG tablet Take 1 tablet (25 mg total) by mouth 2 (two) times daily. (Patient taking differently: Take 25 mg by mouth at bedtime.)   nitroGLYCERIN (NITROSTAT) 0.4 MG SL tablet ONE TABLET UNDER TONGUE AS NEEDED FOR CHEST PAIN. MAY REPEAT IN 5 MINUTES AND AGAIN IN 5 MINUTES (TOTAL OF 3 TABLETS)   OLANZapine (ZYPREXA) 2.5 MG tablet Take 1 tablet (2.5 mg total) by mouth at bedtime.   omeprazole (PRILOSEC) 40 MG capsule TAKE ONE CAPSULE EACH DAY   oxyCODONE-acetaminophen (PERCOCET/ROXICET) 5-325 MG tablet TAKE ONE TABLET EVERY EIGHT HOURS AS NEEDED FOR SEVERE PAIN   Propylene Glycol (SYSTANE BALANCE OP) Place 2 drops into both eyes daily as needed (dry eyes).    rosuvastatin (CRESTOR) 5 MG tablet TAKE ONE TABLET BY MOUTH ONCE DAILY   valACYclovir (VALTREX) 500 MG tablet Take 1 tablet (500 mg total) by mouth 2 (two) times daily.    zolpidem (AMBIEN) 10 MG tablet TAKE 1/2 TO 1 TABLET AT BEDTIME AS NEEDED FOR SLEEP   No facility-administered encounter medications on file as of 04/05/2022.       10/05/2021   11:00 AM 04/03/2021   10:00 AM 11/22/2017    9:49 AM  MMSE - Mini Mental State Exam  Orientation to time '2 1 5  '$ Orientation to Place '3 4 5  '$ Registration '3 3 3  '$ Attention/ Calculation '1 4 5  '$ Recall 0 0 1  Language- name 2 objects '2 2 2  '$ Language- repeat '1 1 1  '$ Language- follow 3 step command '3 3 3  '$ Language- read & follow direction '1 1 1  '$ Write a sentence '1 1 1  '$ Copy design 0 1 1  Total score '17 21 28      '$ 03/10/2020   10:00 AM 04/16/2019   10:00 AM  Montreal Cognitive Assessment   Visuospatial/ Executive (0/5) 1 3  Naming (0/3) 3 3  Attention: Read list of digits (0/2) 2 2  Attention: Read list of letters (0/1) 1 1  Attention: Serial 7 subtraction starting at 100 (0/3) 3 3  Language: Repeat phrase (0/2) 1 1  Language : Fluency (0/1) 1 1  Abstraction (0/2) 1 2  Delayed Recall (0/5) 0 2  Orientation (0/6) 3 6  Total 16 24  Adjusted Score (based on education) 16     Objective:     PHYSICAL EXAMINATION:    VITALS:   Vitals:   04/05/22 1051  BP: 132/82  Pulse: 84  Resp: 18  SpO2: 97%  Weight: 184 lb (83.5 kg)  Height: 6' (1.829 m)    GEN:  The patient appears stated age and is in NAD. HEENT:  Normocephalic, atraumatic.   Neurological examination:  General: NAD, well-groomed, appears stated age. Orientation: The patient is alert. Oriented to person, place and date Cranial nerves: There is good facial symmetry.The speech is fluent and clear. No aphasia or dysarthria. Fund of knowledge is appropriate. Recent and remote memory are impaired. Attention and concentration are reduced.  Able to name objects and repeat phrases.  Hearing is intact to conversational tone.    Sensation: Sensation is intact to light touch  throughout Motor: Strength is at least antigravity x4. Tremors: none  DTR's  2/4 in UE/LE     Movement examination: Tone: There is normal tone in the UE/LE Abnormal movements:  no tremor.  No myoclonus.  No asterixis.   Coordination:  There is no decremation with RAM's. Normal finger to nose  Gait and Station: The patient has no difficulty arising out of a deep-seated chair without the use of the hands. The patient's stride length is good.  Gait is cautious and narrow.    Thank you for allowing [place] the opportunity to participate in the care of this nice patient. Please do not hesitate to contact us for any questions or concerns.   Total time spent on today's visit was 33 minutes dedicated to this patient today, preparing to see patient, examining the patient, ordering tests and/or medications and counseling the patient, documenting clinical information in the EHR or other health record, independently interpreting results and communicating results to the patient/family, discussing treatment and goals, answering patient's questions and coordinating care.  Cc:  Plotnikov, Evie Lacks, MD  Sharene Butters 04/05/2022 10:54 AM

## 2022-04-08 DIAGNOSIS — M0609 Rheumatoid arthritis without rheumatoid factor, multiple sites: Secondary | ICD-10-CM | POA: Diagnosis not present

## 2022-05-07 ENCOUNTER — Other Ambulatory Visit: Payer: Self-pay | Admitting: Cardiology

## 2022-05-07 ENCOUNTER — Other Ambulatory Visit: Payer: Self-pay | Admitting: Internal Medicine

## 2022-05-07 DIAGNOSIS — I480 Paroxysmal atrial fibrillation: Secondary | ICD-10-CM

## 2022-05-10 DIAGNOSIS — M0609 Rheumatoid arthritis without rheumatoid factor, multiple sites: Secondary | ICD-10-CM | POA: Diagnosis not present

## 2022-05-12 ENCOUNTER — Other Ambulatory Visit (HOSPITAL_BASED_OUTPATIENT_CLINIC_OR_DEPARTMENT_OTHER): Payer: Self-pay

## 2022-05-12 ENCOUNTER — Ambulatory Visit (INDEPENDENT_AMBULATORY_CARE_PROVIDER_SITE_OTHER): Payer: Medicare Other | Admitting: Internal Medicine

## 2022-05-12 ENCOUNTER — Encounter: Payer: Self-pay | Admitting: Internal Medicine

## 2022-05-12 VITALS — BP 130/78 | HR 75 | Temp 98.2°F | Ht 72.0 in | Wt 188.0 lb

## 2022-05-12 DIAGNOSIS — G308 Other Alzheimer's disease: Secondary | ICD-10-CM

## 2022-05-12 DIAGNOSIS — M5440 Lumbago with sciatica, unspecified side: Secondary | ICD-10-CM

## 2022-05-12 DIAGNOSIS — F02B3 Dementia in other diseases classified elsewhere, moderate, with mood disturbance: Secondary | ICD-10-CM | POA: Diagnosis not present

## 2022-05-12 DIAGNOSIS — N320 Bladder-neck obstruction: Secondary | ICD-10-CM

## 2022-05-12 DIAGNOSIS — R739 Hyperglycemia, unspecified: Secondary | ICD-10-CM | POA: Diagnosis not present

## 2022-05-12 DIAGNOSIS — G4700 Insomnia, unspecified: Secondary | ICD-10-CM

## 2022-05-12 DIAGNOSIS — Z0000 Encounter for general adult medical examination without abnormal findings: Secondary | ICD-10-CM

## 2022-05-12 MED ORDER — ZOLPIDEM TARTRATE 10 MG PO TABS
ORAL_TABLET | ORAL | 1 refills | Status: DC
  Filled 2022-05-12: qty 90, 90d supply, fill #0

## 2022-05-12 NOTE — Progress Notes (Signed)
Subjective:  Patient ID: [name], male    DOB: Aug 09, 1933  Age: 88 y.o. MRN: [id number]  CC: No chief complaint on file.   HPI [name] presents for RA, dementia, dysphoric  Outpatient Medications Prior to Visit  Medication Sig Dispense Refill   abatacept (ORENCIA) 250 MG injection every 30 (thirty) days.     acetaminophen (TYLENOL) 500 MG tablet Take 1,000 mg by mouth every 6 (six) hours as needed.     acidophilus (RISAQUAD) CAPS capsule Take 1 capsule by mouth daily.     aspirin EC 81 MG tablet Take 81 mg by mouth daily.     donepezil (ARICEPT) 5 MG tablet TAKE ONE TABLET BY MOUTH EVERY MORNING 30 tablet 11   ELIQUIS 5 MG TABS tablet TAKE ONE TABLET BY MOUTH EVERY MORNING and TAKE ONE TABLET BY MOUTH EVERYDAY AT BEDTIME 180 tablet 1   escitalopram (LEXAPRO) 20 MG tablet TAKE ONE TABLET BY MOUTH ONCE DAILY 90 tablet 3   Evolocumab (REPATHA SURECLICK) 240 MG/ML SOAJ INJECT 1 PEN INTO SKIN EVERY 14 DAYS 2 mL 11   ezetimibe (ZETIA) 10 MG tablet TAKE 1/2 TABLET BY MOUTH ONCE DAILY 45 tablet 3   lisinopril (ZESTRIL) 10 MG tablet Take 1 tablet (10 mg total) by mouth daily. 90 tablet 1   memantine (NAMENDA) 10 MG tablet Take 1 tablet (10 mg total) by mouth daily. 90 tablet 3   metoprolol tartrate (LOPRESSOR) 25 MG tablet Take 1 tablet (25 mg total) by mouth 2 (two) times daily. (Patient taking differently: Take 25 mg by mouth at bedtime.) 180 tablet 3   nitroGLYCERIN (NITROSTAT) 0.4 MG SL tablet ONE TABLET UNDER TONGUE AS NEEDED FOR CHEST PAIN. MAY REPEAT IN 5 MINUTES AND AGAIN IN 5 MINUTES (TOTAL OF 3 TABLETS) 25 tablet 6   OLANZapine (ZYPREXA) 2.5 MG tablet Take 1 tablet (2.5 mg total) by mouth at bedtime. 30 tablet 2   omeprazole (PRILOSEC) 40 MG capsule TAKE ONE CAPSULE BY MOUTH ONCE DAILY 90 capsule 3   oxyCODONE-acetaminophen (PERCOCET/ROXICET) 5-325 MG tablet TAKE ONE TABLET EVERY EIGHT HOURS AS NEEDED FOR SEVERE PAIN 90 tablet 0   Propylene Glycol (SYSTANE BALANCE OP) Place 2  drops into both eyes daily as needed (dry eyes).      rosuvastatin (CRESTOR) 5 MG tablet TAKE ONE TABLET BY MOUTH ONCE DAILY 90 tablet 3   valACYclovir (VALTREX) 500 MG tablet Take 1 tablet (500 mg total) by mouth 2 (two) times daily. 14 tablet 1   zolpidem (AMBIEN) 10 MG tablet TAKE 1/2 TO 1 TABLET AT BEDTIME AS NEEDED FOR SLEEP 30 tablet 3   No facility-administered medications prior to visit.    ROS: Review of Systems  Constitutional:  Negative for appetite change, fatigue and unexpected weight change.  HENT:  Negative for congestion, nosebleeds, sneezing, sore throat and trouble swallowing.   Eyes:  Negative for itching and visual disturbance.  Respiratory:  Negative for cough.   Cardiovascular:  Negative for chest pain, palpitations and leg swelling.  Gastrointestinal:  Negative for abdominal distention, blood in stool, diarrhea and nausea.  Genitourinary:  Negative for frequency and hematuria.  Musculoskeletal:  Positive for arthralgias. Negative for back pain, gait problem, joint swelling and neck pain.  Skin:  Negative for rash.  Neurological:  Negative for dizziness, tremors, speech difficulty and weakness.  Psychiatric/Behavioral:  Positive for confusion, decreased concentration and dysphoric mood. Negative for agitation, behavioral problems, sleep disturbance and suicidal ideas. The patient is not  nervous/anxious.     Objective:  BP 130/78 (BP Location: Left Arm, Patient Position: Sitting, Cuff Size: Large)   Pulse 75   Temp 98.2 F (36.8 C) (Oral)   Ht 6' (1.829 m)   Wt 188 lb (85.3 kg)   SpO2 93%   BMI 25.50 kg/m   BP Readings from Last 3 Encounters:  05/12/22 130/78  04/05/22 132/82  01/19/22 100/60    Wt Readings from Last 3 Encounters:  05/12/22 188 lb (85.3 kg)  04/05/22 184 lb (83.5 kg)  01/19/22 181 lb (82.1 kg)    Physical Exam Constitutional:      General: He is not in acute distress.    Appearance: He is well-developed. He is not diaphoretic.      Comments: NAD  HENT:     Head: Normocephalic and atraumatic.     Right Ear: External ear normal.     Left Ear: External ear normal.     Nose: Nose normal.     Mouth/Throat:     Pharynx: No oropharyngeal exudate.  Eyes:     General: No scleral icterus.       Right eye: No discharge.        Left eye: No discharge.     Conjunctiva/sclera: Conjunctivae normal.     Pupils: Pupils are equal, round, and reactive to light.  Neck:     Thyroid: No thyromegaly.     Vascular: No JVD.     Trachea: No tracheal deviation.  Cardiovascular:     Rate and Rhythm: Normal rate and regular rhythm.     Heart sounds: Normal heart sounds. No murmur heard.    No friction rub. No gallop.  Pulmonary:     Effort: Pulmonary effort is normal. No respiratory distress.     Breath sounds: Normal breath sounds. No stridor. No wheezing or rales.  Chest:     Chest wall: No tenderness.  Abdominal:     General: Bowel sounds are normal. There is no distension.     Palpations: Abdomen is soft. There is no mass.     Tenderness: There is no abdominal tenderness. There is no guarding or rebound.  Genitourinary:    Penis: Normal. No tenderness.      Prostate: Normal.     Rectum: Normal. Guaiac result negative.  Musculoskeletal:        General: No tenderness. Normal range of motion.     Cervical back: Normal range of motion and neck supple.  Lymphadenopathy:     Cervical: No cervical adenopathy.  Skin:    General: Skin is warm and dry.     Coloration: Skin is not pale.     Findings: No erythema or rash.  Neurological:     Mental Status: He is alert and oriented to person, place, and time.     Cranial Nerves: No cranial nerve deficit.     Motor: No abnormal muscle tone.     Coordination: Coordination normal.     Gait: Gait normal.     Deep Tendon Reflexes: Reflexes are normal and symmetric. Reflexes normal.  Psychiatric:        Behavior: Behavior normal.        Thought Content: Thought content normal.         Judgment: Judgment normal.     Lab Results  Component Value Date   WBC 6.7 02/15/2021   HGB 13.2 02/15/2021   HCT 39.9 02/15/2021   PLT 304 02/15/2021   GLUCOSE 93 02/15/2021  CHOL 197 08/04/2020   TRIG 254 (H) 08/04/2020   HDL 47 08/04/2020   LDLDIRECT 168.9 05/24/2013   LDLCALC 106 (H) 08/04/2020   ALT 13 02/15/2021   AST 21 02/15/2021   NA 138 02/15/2021   K 3.9 02/15/2021   CL 108 02/15/2021   CREATININE 0.82 02/15/2021   BUN 15 02/15/2021   CO2 25 02/15/2021   TSH 1.18 12/13/2017   PSA 2.46 02/21/2017   INR 0.92 09/13/2016   HGBA1C 6.4 08/30/2017    CT Head Wo Contrast  Result Date: 11/10/2021 CLINICAL DATA:  Head trauma, minor (Age >= 65y) fall on eliquis EXAM: CT HEAD WITHOUT CONTRAST TECHNIQUE: Contiguous axial images were obtained from the base of the skull through the vertex without intravenous contrast. COMPARISON:  04/13/2019 head CT. FINDINGS: Brain: No evidence of parenchymal hemorrhage or extra-axial fluid collection. No mass lesion, mass effect, or midline shift. No CT evidence of acute infarction. Nonspecific prominent subcortical and periventricular white matter hypodensity, most in keeping with chronic small vessel ischemic change. Cerebral volume is age appropriate. No ventriculomegaly. Vascular: No acute abnormality. Skull: No evidence of calvarial fracture. Sinuses/Orbits: The visualized paranasal sinuses are essentially clear. Other:  The mastoid air cells are unopacified. IMPRESSION: 1. No evidence of acute intracranial abnormality. No evidence of calvarial fracture. 2. Prominent chronic small vessel ischemic changes in the cerebral white matter. Electronically Signed   By: Ilona Sorrel M.D.   On: 11/10/2021 12:16    Assessment & Plan:   Problem List Items Addressed This Visit     Alzheimer's dementia ([place])    Donepezil and Namenda Cont meds Try Lion's Mane Mushroom extract or capsules for memory Do not drive Off Namenda due to n/v On  Donepezil   No driving is discussed      Relevant Medications   zolpidem (AMBIEN) 10 MG tablet   Hyperglycemia    Check A1c       Relevant Orders   Hemoglobin A1c   Insomnia    Zolpidem prn Not to take w/Lorazepam prn  Potential benefits of a long term benzodiazepines  use as well as potential risks  and complications were explained to the patient and were aknowledged.      LOW BACK PAIN    Cont on Percocet prn  Potential benefits of a long term opioids use as well as potential risks (i.e. addiction risk, apnea etc) and complications (i.e. Somnolence, constipation and others) were explained to the patient and were aknowledged.      Well adult exam - Primary   Relevant Orders   Comprehensive metabolic panel   TSH   PSA   Lipid panel   Other Visit Diagnoses     Bladder neck obstruction       Relevant Orders   PSA         Meds ordered this encounter  Medications   zolpidem (AMBIEN) 10 MG tablet    Sig: TAKE 1/2 TO 1 TABLET AT BEDTIME AS NEEDED FOR SLEEP    Dispense:  90 tablet    Refill:  1      Follow-up: Return in about 4 months (around 09/11/2022) for Wellness Exam.  [name], MD

## 2022-05-12 NOTE — Assessment & Plan Note (Signed)
Donepezil and Namenda Cont meds Try Lion's Mane Mushroom extract or capsules for memory Do not drive Off Namenda due to n/v On  Donepezil  No driving is discussed

## 2022-05-12 NOTE — Assessment & Plan Note (Signed)
Zolpidem prn Not to take w/Lorazepam prn  Potential benefits of a long term benzodiazepines  use as well as potential risks  and complications were explained to the patient and were aknowledged.

## 2022-05-12 NOTE — Assessment & Plan Note (Signed)
Check A1c. 

## 2022-05-12 NOTE — Assessment & Plan Note (Signed)
Cont on Percocet prn  Potential benefits of a long term opioids use as well as potential risks (i.e. addiction risk, apnea etc) and complications (i.e. Somnolence, constipation and others) were explained to the patient and were aknowledged.

## 2022-05-14 ENCOUNTER — Other Ambulatory Visit: Payer: Self-pay | Admitting: Internal Medicine

## 2022-06-07 DIAGNOSIS — M0609 Rheumatoid arthritis without rheumatoid factor, multiple sites: Secondary | ICD-10-CM | POA: Diagnosis not present

## 2022-06-07 DIAGNOSIS — Z79899 Other long term (current) drug therapy: Secondary | ICD-10-CM | POA: Diagnosis not present

## 2022-06-11 DIAGNOSIS — H524 Presbyopia: Secondary | ICD-10-CM | POA: Diagnosis not present

## 2022-06-11 DIAGNOSIS — H04122 Dry eye syndrome of left lacrimal gland: Secondary | ICD-10-CM | POA: Diagnosis not present

## 2022-06-11 DIAGNOSIS — Z961 Presence of intraocular lens: Secondary | ICD-10-CM | POA: Diagnosis not present

## 2022-06-25 ENCOUNTER — Ambulatory Visit (INDEPENDENT_AMBULATORY_CARE_PROVIDER_SITE_OTHER): Payer: Medicare Other

## 2022-06-25 DIAGNOSIS — Z0000 Encounter for general adult medical examination without abnormal findings: Secondary | ICD-10-CM

## 2022-06-25 NOTE — Patient Instructions (Signed)
[name] , Thank you for taking time to come for your Medicare Wellness Visit. I appreciate your ongoing commitment to your health goals. Please review the following plan we discussed and let me know if I can assist you in the future.   Screening recommendations/referrals: Colonoscopy: no longer required  Recommended yearly ophthalmology/optometry visit for glaucoma screening and checkup Recommended yearly dental visit for hygiene and checkup  Vaccinations: Influenza vaccine: completed  Pneumococcal vaccine: completed  Tdap vaccine: due  Shingles vaccine: completed     Advanced directives: yes   Conditions/risks identified: none   Next appointment: none   Preventive Care 33 Years and Older, Male Preventive care refers to lifestyle choices and visits with your health care provider that can promote health and wellness. What does preventive care include? A yearly physical exam. This is also called an annual well check. Dental exams once or twice a year. Routine eye exams. Ask your health care provider how often you should have your eyes checked. Personal lifestyle choices, including: Daily care of your teeth and gums. Regular physical activity. Eating a healthy diet. Avoiding tobacco and drug use. Limiting alcohol use. Practicing safe sex. Taking low doses of aspirin every day. Taking vitamin and mineral supplements as recommended by your health care provider. What happens during an annual well check? The services and screenings done by your health care provider during your annual well check will depend on your age, overall health, lifestyle risk factors, and family history of disease. Counseling  Your health care provider may ask you questions about your: Alcohol use. Tobacco use. Drug use. Emotional well-being. Home and relationship well-being. Sexual activity. Eating habits. History of falls. Memory and ability to understand (cognition). Work and work  Statistician. Screening  You may have the following tests or measurements: Height, weight, and BMI. Blood pressure. Lipid and cholesterol levels. These may be checked every 5 years, or more frequently if you are over 47 years old. Skin check. Lung cancer screening. You may have this screening every year starting at age 7 if you have a 30-pack-year history of smoking and currently smoke or have quit within the past 15 years. Fecal occult blood test (FOBT) of the stool. You may have this test every year starting at age 24. Flexible sigmoidoscopy or colonoscopy. You may have a sigmoidoscopy every 5 years or a colonoscopy every 10 years starting at age 68. Prostate cancer screening. Recommendations will vary depending on your family history and other risks. Hepatitis C blood test. Hepatitis B blood test. Sexually transmitted disease (STD) testing. Diabetes screening. This is done by checking your blood sugar (glucose) after you have not eaten for a while (fasting). You may have this done every 1-3 years. Abdominal aortic aneurysm (AAA) screening. You may need this if you are a current or former smoker. Osteoporosis. You may be screened starting at age 52 if you are at high risk. Talk with your health care provider about your test results, treatment options, and if necessary, the need for more tests. Vaccines  Your health care provider may recommend certain vaccines, such as: Influenza vaccine. This is recommended every year. Tetanus, diphtheria, and acellular pertussis (Tdap, Td) vaccine. You may need a Td booster every 10 years. Zoster vaccine. You may need this after age 25. Pneumococcal 13-valent conjugate (PCV13) vaccine. One dose is recommended after age 51. Pneumococcal polysaccharide (PPSV23) vaccine. One dose is recommended after age 5. Talk to your health care provider about which screenings and vaccines you need and  how often you need them. This information is not intended to replace  advice given to you by your health care provider. Make sure you discuss any questions you have with your health care provider. Document Released: 11/28/2015 Document Revised: 07/21/2016 Document Reviewed: 09/02/2015 Elsevier Interactive Patient Education  2017 [place] Prevention in the Home Falls can cause injuries. They can happen to people of all ages. There are many things you can do to make your home safe and to help prevent falls. What can I do on the outside of my home? Regularly fix the edges of walkways and driveways and fix any cracks. Remove anything that might make you trip as you walk through a door, such as a raised step or threshold. Trim any bushes or trees on the path to your home. Use bright outdoor lighting. Clear any walking paths of anything that might make someone trip, such as rocks or tools. Regularly check to see if handrails are loose or broken. Make sure that both sides of any steps have handrails. Any raised decks and porches should have guardrails on the edges. Have any leaves, snow, or ice cleared regularly. Use sand or salt on walking paths during winter. Clean up any spills in your garage right away. This includes oil or grease spills. What can I do in the bathroom? Use night lights. Install grab bars by the toilet and in the tub and shower. Do not use towel bars as grab bars. Use non-skid mats or decals in the tub or shower. If you need to sit down in the shower, use a plastic, non-slip stool. Keep the floor dry. Clean up any water that spills on the floor as soon as it happens. Remove soap buildup in the tub or shower regularly. Attach bath mats securely with double-sided non-slip rug tape. Do not have throw rugs and other things on the floor that can make you trip. What can I do in the bedroom? Use night lights. Make sure that you have a light by your bed that is easy to reach. Do not use any sheets or blankets that are too big for your bed.  They should not hang down onto the floor. Have a firm chair that has side arms. You can use this for support while you get dressed. Do not have throw rugs and other things on the floor that can make you trip. What can I do in the kitchen? Clean up any spills right away. Avoid walking on wet floors. Keep items that you use a lot in easy-to-reach places. If you need to reach something above you, use a strong step stool that has a grab bar. Keep electrical cords out of the way. Do not use floor polish or wax that makes floors slippery. If you must use wax, use non-skid floor wax. Do not have throw rugs and other things on the floor that can make you trip. What can I do with my stairs? Do not leave any items on the stairs. Make sure that there are handrails on both sides of the stairs and use them. Fix handrails that are broken or loose. Make sure that handrails are as long as the stairways. Check any carpeting to make sure that it is firmly attached to the stairs. Fix any carpet that is loose or worn. Avoid having throw rugs at the top or bottom of the stairs. If you do have throw rugs, attach them to the floor with carpet tape. Make sure that you have  a light switch at the top of the stairs and the bottom of the stairs. If you do not have them, ask someone to add them for you. What else can I do to help prevent falls? Wear shoes that: Do not have high heels. Have rubber bottoms. Are comfortable and fit you well. Are closed at the toe. Do not wear sandals. If you use a stepladder: Make sure that it is fully opened. Do not climb a closed stepladder. Make sure that both sides of the stepladder are locked into place. Ask someone to hold it for you, if possible. Clearly mark and make sure that you can see: Any grab bars or handrails. First and last steps. Where the edge of each step is. Use tools that help you move around (mobility aids) if they are needed. These  include: Canes. Walkers. Scooters. Crutches. Turn on the lights when you go into a dark area. Replace any light bulbs as soon as they burn out. Set up your furniture so you have a clear path. Avoid moving your furniture around. If any of your floors are uneven, fix them. If there are any pets around you, be aware of where they are. Review your medicines with your doctor. Some medicines can make you feel dizzy. This can increase your chance of falling. Ask your doctor what other things that you can do to help prevent falls. This information is not intended to replace advice given to you by your health care provider. Make sure you discuss any questions you have with your health care provider. Document Released: 08/28/2009 Document Revised: 04/08/2016 Document Reviewed: 12/06/2014 Elsevier Interactive Patient Education  2017 [employer].

## 2022-06-25 NOTE — Progress Notes (Cosign Needed Addendum)
Subjective:   [name] is a 88 y.o. male who presents for an Subsequent Medicare Annual Wellness Visit.   I connected with Rennis Harding  today by telephone and verified that I am speaking with the correct person using two identifiers. Location patient: home Location provider: work Persons participating in the virtual visit: patient, provider.   I discussed the limitations, risks, security and privacy concerns of performing an evaluation and management service by telephone and the availability of in person appointments. I also discussed with the patient that there may be a patient responsible charge related to this service. The patient expressed understanding and verbally consented to this telephonic visit.    Interactive audio and video telecommunications were attempted between this provider and patient, however failed, due to patient having technical difficulties OR patient did not have access to video capability.  We continued and completed visit with audio only.    Review of Systems     Cardiac Risk Factors include: advanced age (>31mn, >>25women);male gender     Objective:    Today's Vitals   There is no height or weight on file to calculate BMI.     06/25/2022    9:38 AM 04/05/2022   10:52 AM 11/10/2021   10:30 AM 10/05/2021   10:48 AM 08/31/2021   12:38 PM 06/17/2021   12:01 PM 04/03/2021    8:34 AM  Advanced Directives  Does Patient Have a Medical Advance Directive? Yes Yes No Yes Yes Yes Yes  Type of [place]        Does patient want to make changes to medical advance directive?      No - Patient declined   Copy of [place] Chart? No - copy requested        Would patient like information on creating a medical advance directive?   No - Patient declined        Current Medications (verified) Outpatient Encounter Medications as of 06/25/2022  Medication Sig   acetaminophen (TYLENOL) 500 MG  tablet Take 1,000 mg by mouth every 6 (six) hours as needed.   aspirin EC 81 MG tablet Take 81 mg by mouth daily.   donepezil (ARICEPT) 5 MG tablet TAKE ONE TABLET BY MOUTH EVERY MORNING   ELIQUIS 5 MG TABS tablet TAKE ONE TABLET BY MOUTH EVERY MORNING and TAKE ONE TABLET BY MOUTH EVERYDAY AT BEDTIME   escitalopram (LEXAPRO) 20 MG tablet TAKE ONE TABLET BY MOUTH ONCE DAILY   Evolocumab (REPATHA SURECLICK) 1413MG/ML SOAJ INJECT 1 PEN INTO SKIN EVERY 14 DAYS   ezetimibe (ZETIA) 10 MG tablet TAKE 1/2 TABLET BY MOUTH ONCE DAILY   memantine (NAMENDA) 10 MG tablet Take 1 tablet (10 mg total) by mouth daily.   metoprolol tartrate (LOPRESSOR) 25 MG tablet TAKE ONE TABLET BY MOUTH EVERYDAY AT BEDTIME   omeprazole (PRILOSEC) 40 MG capsule TAKE ONE CAPSULE BY MOUTH ONCE DAILY   Propylene Glycol (SYSTANE BALANCE OP) Place 2 drops into both eyes daily as needed (dry eyes).    rosuvastatin (CRESTOR) 5 MG tablet TAKE ONE TABLET BY MOUTH ONCE DAILY   zolpidem (AMBIEN) 10 MG tablet TAKE 1/2 TO 1 TABLET AT BEDTIME AS NEEDED FOR SLEEP   abatacept (ORENCIA) 250 MG injection every 30 (thirty) days. (Patient not taking: Reported on 06/25/2022)   acidophilus (RISAQUAD) CAPS capsule Take 1 capsule by mouth daily. (Patient not taking: Reported on 06/25/2022)   lisinopril (ZESTRIL) 10  MG tablet Take 1 tablet (10 mg total) by mouth daily. (Patient not taking: Reported on 06/25/2022)   nitroGLYCERIN (NITROSTAT) 0.4 MG SL tablet ONE TABLET UNDER TONGUE AS NEEDED FOR CHEST PAIN. MAY REPEAT IN 5 MINUTES AND AGAIN IN 5 MINUTES (TOTAL OF 3 TABLETS) (Patient not taking: Reported on 06/25/2022)   OLANZapine (ZYPREXA) 2.5 MG tablet Take 1 tablet (2.5 mg total) by mouth at bedtime. (Patient not taking: Reported on 06/25/2022)   oxyCODONE-acetaminophen (PERCOCET/ROXICET) 5-325 MG tablet TAKE ONE TABLET EVERY EIGHT HOURS AS NEEDED FOR SEVERE PAIN (Patient not taking: Reported on 06/25/2022)   valACYclovir (VALTREX) 500 MG tablet Take 1  tablet (500 mg total) by mouth 2 (two) times daily. (Patient not taking: Reported on 06/25/2022)   No facility-administered encounter medications on file as of 06/25/2022.    Allergies (verified) Methotrexate, Aricept [donepezil hcl], Crestor [rosuvastatin calcium], Lisinopril, Namenda [memantine], Atorvastatin, and Pravastatin sodium   History: Past Medical History:  Diagnosis Date   CAD (coronary artery disease)    a. s/p inferior STEMI 01/08/2014; LHC 01/08/14: total RCA occlusion s/p 3.5x55m Xience DES distal RCA and 3.5x28 mm DES mid RCA, 60-70% mid LAD stenosis, EF 55%.   Complication of anesthesia    'long to wake up after back surgery " 09/2003   Diverticulosis of colon    GERD (gastroesophageal reflux disease)    History of cellulitis    05-26-2015  LLE   History of colon polyps    1998- benign/  2008 adenomatous    History of kidney stones    2013   History of squamous cell carcinoma excision    2013;  2015;  06-12-2015 right leg/  02/ and 05/ 2017  left ear and left leg   Hydronephrosis, left    Hyperlipidemia    Hypertension    Migraine with aura    OSA on CPAP 06/19/2015   Moderate OSA with AHI 18/hr  per study 05-20-2015   Osteoarthritis    [place]oxysmal atrial fibrillation [place] 4[place]16   chads2vasc score is at least 4   Premature atrial contractions    RA (rheumatoid arthritis) ([facility]    rheumatologist-  dr jLeigh Aurora  Sinusitis, chronic 10/17/2015   Wears glasses    Wears hearing aid    bilateral   Past Surgical History:  Procedure Laterality Date   BACK SURGERY     CARDIOVASCULAR STRESS TEST  11/21/2015   Low risk nuclear study w/ a small diaphragmatic attenuation artifact, no ischemia/  normal LV function and wall motion , ef 63%   CATARACT EXTRACTION W/ INTRAOCULAR LENS  IMPLANT, BILATERAL  2015   COLONOSCOPY  last one 06-09-2011   CORONARY ANGIOPLASTY     CYSTOSCOPY W/ RETROGRADES Left 07/12/2016   Procedure: CYSTOSCOPY WITH RETROGRADE PYELOGRAM LEFT  URETERAL STENT;  Surgeon: JIrine Seal MD;  Location: [facility];  Service: Urology;  Laterality: Left;   CYSTOSCOPY WITH RETROGRADE PYELOGRAM, URETEROSCOPY AND STENT PLACEMENT Left 08/11/2016   Procedure: CYSTOSCOPY WITH RETROGRADE PYELOGRAM,  DIAGNOSTIC URETEROSCOPY , STENT EXCHANGE;  Surgeon: TAlexis Frock MD;  [facility]  Service: Urology;  Laterality: Left;   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   severe fibromatosis palm and fingers   LEFT HEART CATHETERIZATION WITH CORONARY ANGIOGRAM N/A 01/08/2014   Procedure: LEFT HEART CATHETERIZATION WITH CORONARY ANGIOGRAM;  Surgeon: TTroy Sine MD;  [facility];  Service: Cardiovascular;  Laterality:N/A;  total/ subtotal RCA/  mLAD 616-94%w/ mid systolic bridging/  preserved  global LVF, ef 55%   MOHS SURGERY  x2  feb and may 2017   left ear /  left leg  ([facility])   PERCUTANEOUS CORONARY STENT INTERVENTION (PCI-S)  01/08/2014   Procedure: PERCUTANEOUS CORONARY STENT INTERVENTION (PCI-S);  Surgeon: Troy Sine, MD;  [facility];  Service: Cardiovascular;;  DES to mid and distal RCA   POSTERIOR LUMBAR FUSION  10/01/2003   and Laminectomy/ diskectomy  L4 -- S1   ROBOT ASSISTED PYELOPLASTY Left 09/15/2016   Procedure: XI ROBOTIC ASSISTED PYELOPLASTY;  Surgeon: Alexis Frock, MD;  Location: [facility];  Service: Urology;  Laterality: Left;   TONSILLECTOMY     TRANSTHORACIC ECHOCARDIOGRAM  02/23/2016   mild LVH,  ef 50-55%,  grade 1 diastolic dysfunction/  mild to moderate AV calcification w/ no stenosis or regurg./  trivial MR and TR/  mild PR   Family History  Problem Relation Age of Onset   Hypertension Mother    Heart disease Father    Colon cancer Neg Hx    Social History   Socioeconomic History   Marital status: Married    Spouse name: Not on file   Number of children: 2   Years of education: Not on file   Highest education level: Not on file  Occupational History   Occupation: Retired     Fish farm manager: RETIRED  Tobacco Use   Smoking status: Former    Years: 10.00    Types: Cigarettes    Quit date: 08/09/1968    Years since quitting: 53.9   Smokeless tobacco: Never  Vaping Use   Vaping Use: Never used  Substance and Sexual Activity   Alcohol use: Yes    Alcohol/week: 2.0 standard drinks of alcohol    Types: 2 Shots of liquor per week    Comment: daily   Drug use: No   Sexual activity: Yes    Partners: Female  Other Topics Concern   Not on file  Social History Narrative   1 Caffeine drink daily    Right handed       Lives with wife      Social Determinants of Health   Financial Resource Strain: Low Risk  (06/25/2022)   Overall Financial Resource Strain (CARDIA)    Difficulty of Paying Living Expenses: Not hard at all  Food Insecurity: No Food Insecurity (06/25/2022)   Hunger Vital Sign    Worried About Running Out of Food in the Last Year: Never true    Ran Out of Food in the Last Year: Never true  Transportation Needs: No Transportation Needs (06/25/2022)   PRAPARE - Hydrologist (Medical): No    Lack of Transportation (Non-Medical): No  Physical Activity: Insufficiently Active (06/25/2022)   Exercise Vital Sign    Days of Exercise per Week: 3 days    Minutes of Exercise per Session: 30 min  Stress: No Stress Concern Present (06/25/2022)   [place]    Feeling of Stress : Not at all  Social Connections: Moderately Integrated (06/25/2022)   Social Connection and Isolation Panel [NHANES]    Frequency of Communication with Friends and Family: Three times a week    Frequency of Social Gatherings with Friends and Family: Three times a week    Attends Religious Services: More than 4 times per year    Active Member of Clubs or Organizations: No    Attends Archivist Meetings: Never  Marital Status: Married    Tobacco Counseling Counseling given: Not  Answered   Clinical Intake:  Pre-visit preparation completed: Yes  Pain : No/denies pain     Nutritional Risks: None Diabetes: No  How often do you need to have someone help you when you read instructions, pamphlets, or other written materials from your doctor or pharmacy?: 1 - Never What is the last grade level you completed in school?: college  Diabetic?no   Interpreter Needed?: No  Information entered by :: L.Wilson,LPN   Activities of Daily Living    06/25/2022    9:45 AM  In your present state of health, do you have any difficulty performing the following activities:  Hearing? 0  Vision? 0  Difficulty concentrating or making decisions? 0  Walking or climbing stairs? 0  Dressing or bathing? 0  Doing errands, shopping? 0  Preparing Food and eating ? N  Using the Toilet? N  In the past six months, have you accidently leaked urine? N  Do you have problems with loss of bowel control? N  Managing your Medications? N  Managing your Finances? N  Housekeeping or managing your Housekeeping? N    Patient Care Team: Plotnikov, Evie Lacks, MD as PCP - General Sueanne Margarita, MD as PCP - Cardiology (Cardiology) Thompson Grayer, MD as PCP - Electrophysiology (Cardiology) Irene Shipper, MD (Gastroenterology) Thompson Grayer, MD (Cardiology) Hennie Duos, MD as Consulting Physician (Rheumatology) Alexis Frock, MD as Consulting Physician (Urology) Katy Apo, MD as Consulting Physician (Ophthalmology) Mathis Fare (Dentistry) Tiajuana Amass, MD as Referring Physician (Allergy and Immunology) Cameron Sprang, MD as Consulting Physician (Neurology) Charlton Haws, [place] as Pharmacist (Pharmacist) Cameron Sprang, MD as Consulting Physician (Neurology)  Indicate any recent Medical Services you may have received from other than [facility] providers in the past year (date may be approximate).     Assessment:   This is a routine wellness examination for  Ankur.  Hearing/Vision screen Vision Screening - Comments:: Annual eye exams   Dietary issues and exercise activities discussed: Current Exercise Habits: Home exercise routine, Type of exercise: walking, Time (Minutes): 30, Frequency (Times/Week): 3, Weekly Exercise (Minutes/Week): 90, Intensity: Mild, Exercise limited by: None identified   Goals Addressed   None    Depression Screen    06/25/2022    9:45 AM 06/25/2022    9:43 AM 06/17/2021   11:44 AM 06/11/2020   10:15 AM 11/23/2018   10:05 AM 11/22/2017    9:45 AM 11/18/2016   11:31 AM  PHQ 2/9 Scores  PHQ - 2 Score 0 0 0 0 0 1 0  PHQ- 9 Score      3     Fall Risk    06/25/2022    9:45 AM 04/05/2022   10:51 AM 10/05/2021   10:48 AM 06/17/2021   12:02 PM 04/03/2021    8:34 AM  [place] in the past year? 0 0 0 1 0  Number falls in past yr: 0 0 0 0 0  Injury with Fall? 0 0 0 0 0  Follow up Falls evaluation completed;Education provided   Falls evaluation completed     FALL RISK PREVENTION PERTAINING TO THE HOME:  Any stairs in or around the home? Yes  If so, are there any without handrails? No  Home free of loose throw rugs in walkways, pet beds, electrical cords, etc? Yes  Adequate lighting in your home to reduce risk of falls? Yes  ASSISTIVE DEVICES UTILIZED TO PREVENT FALLS:  Life alert? Yes  Use of a cane, walker or w/c? Yes  Grab bars in the bathroom? Yes  Shower chair or bench in shower? Yes  Elevated toilet seat or a handicapped toilet? Yes     Cognitive Function:  Normal cognitive status assessed by telephone conversation  by this Nurse Health Advisor. No abnormalities found.      10/05/2021   11:00 AM 04/03/2021   10:00 AM 11/22/2017    9:49 AM  MMSE - Mini Mental State Exam  Orientation to time '2 1 5  '$ Orientation to Place '3 4 5  '$ Registration '3 3 3  '$ Attention/ Calculation '1 4 5  '$ Recall 0 0 1  Language- name 2 objects '2 2 2  '$ Language- repeat '1 1 1  '$ Language- follow 3 step command '3 3 3   '$ Language- read & follow direction '1 1 1  '$ Write a sentence '1 1 1  '$ Copy design 0 1 1  Total score '17 21 28      '$ 03/10/2020   10:00 AM 04/16/2019   10:00 AM  Montreal Cognitive Assessment   Visuospatial/ Executive (0/5) 1 3  Naming (0/3) 3 3  Attention: Read list of digits (0/2) 2 2  Attention: Read list of letters (0/1) 1 1  Attention: Serial 7 subtraction starting at 100 (0/3) 3 3  Language: Repeat phrase (0/2) 1 1  Language : Fluency (0/1) 1 1  Abstraction (0/2) 1 2  Delayed Recall (0/5) 0 2  Orientation (0/6) 3 6  Total 16 24  Adjusted Score (based on education) 16       06/25/2022    9:46 AM 06/11/2020   10:18 AM  6CIT Screen  What Year? 0 points 0 points  What month? 0 points 0 points  What time? 0 points 0 points  Count back from 20 0 points 0 points  Months in reverse 0 points 0 points  Repeat phrase 0 points 0 points  Total Score 0 points 0 points    Immunizations Immunization History  Administered Date(s) Administered   Fluad Quad(high Dose 65+) 07/09/2019, 08/15/2020, 09/16/2021   Influenza Split 08/10/2011, 08/15/2012   Influenza Whole 08/21/2009, 09/22/2010   Influenza, High Dose Seasonal PF 09/16/2015, 08/18/2016, 08/31/2017, 08/14/2018   Influenza,inj,Quad PF,6+ Mos 08/06/2013   Influenza-Unspecified 08/31/2017   PFIZER(Purple Top)SARS-COV-2 Vaccination 12/11/2019, 01/01/2020, 08/12/2020   Pneumococcal Conjugate-13 10/02/2013   Pneumococcal Polysaccharide-23 06/04/2010   Tdap 10/22/2011   Zoster Recombinat (Shingrix) 03/09/2017, 05/10/2017    TDAP status: Due, Education has been provided regarding the importance of this vaccine. Advised may receive this vaccine at local pharmacy or Health Dept. Aware to provide a copy of the vaccination record if obtained from local pharmacy or Health Dept. Verbalized acceptance and understanding.  Flu Vaccine status: Up to date  Pneumococcal vaccine status: Up to date  Covid-19 vaccine status: Completed  vaccines  Qualifies for Shingles Vaccine? Yes   Zostavax completed Yes   Shingrix Completed?: Yes  Screening Tests Health Maintenance  Topic Date Due   COVID-19 Vaccine (4 - Pfizer risk series) 10/07/2020   TETANUS/TDAP  10/21/2021   INFLUENZA VACCINE  06/15/2022   Pneumonia Vaccine 16+ Years old  Completed   Zoster Vaccines- Shingrix  Completed   HPV VACCINES  Aged Out    Health Maintenance  Health Maintenance Due  Topic Date Due   COVID-19 Vaccine (4 - Pfizer risk series) 10/07/2020   TETANUS/TDAP  10/21/2021   INFLUENZA VACCINE  06/15/2022    Colorectal cancer screening: No longer required.   Lung Cancer Screening: (Low Dose CT Chest recommended if Age 65-80 years, 30 pack-year currently smoking OR have quit w/in 15years.) does not qualify.   Lung Cancer Screening Referral: n/a  Additional Screening:  Hepatitis C Screening: does not qualify;  Vision Screening: Recommended annual ophthalmology exams for early detection of glaucoma and other disorders of the eye. Is the patient up to date with their annual eye exam?  Yes  Who is the provider or what is the name of the office in which the patient attends annual eye exams? Dr.Lyles  If pt is not established with a provider, would they like to be referred to a provider to establish care? No .   Dental Screening: Recommended annual dental exams for proper oral hygiene  Community Resource Referral / Chronic Care Management: CRR required this visit?  No   CCM required this visit?  No      Plan:     I have personally reviewed and noted the following in the patient's chart:   Medical and social history Use of alcohol, tobacco or illicit drugs  Current medications and supplements including opioid prescriptions. Patient is not currently taking opioid prescriptions. Functional ability and status Nutritional status Physical activity Advanced directives List of other physicians Hospitalizations, surgeries, and ER  visits in previous 12 months Vitals Screenings to include cognitive, depression, and falls Referrals and appointments  In addition, I have reviewed and discussed with patient certain preventive protocols, quality metrics, and best practice recommendations. A written personalized care plan for preventive services as well as general preventive health recommendations were provided to patient.     Daphane Shepherd, LPN   06/28/4817   Nurse Notes: none    Medical screening examination/treatment/procedure(s) were performed by non-physician practitioner and as supervising physician I was immediately available for consultation/collaboration.  I agree with above. Lew Dawes, MD

## 2022-07-12 DIAGNOSIS — Z111 Encounter for screening for respiratory tuberculosis: Secondary | ICD-10-CM | POA: Diagnosis not present

## 2022-07-12 DIAGNOSIS — Z79899 Other long term (current) drug therapy: Secondary | ICD-10-CM | POA: Diagnosis not present

## 2022-07-12 DIAGNOSIS — R5383 Other fatigue: Secondary | ICD-10-CM | POA: Diagnosis not present

## 2022-07-12 DIAGNOSIS — M0609 Rheumatoid arthritis without rheumatoid factor, multiple sites: Secondary | ICD-10-CM | POA: Diagnosis not present

## 2022-07-29 ENCOUNTER — Encounter: Payer: Self-pay | Admitting: Emergency Medicine

## 2022-07-29 ENCOUNTER — Ambulatory Visit (INDEPENDENT_AMBULATORY_CARE_PROVIDER_SITE_OTHER): Payer: Medicare Other | Admitting: Emergency Medicine

## 2022-07-29 VITALS — BP 112/60 | HR 70 | Temp 98.0°F | Resp 96 | Ht 73.0 in | Wt 192.0 lb

## 2022-07-29 DIAGNOSIS — R55 Syncope and collapse: Secondary | ICD-10-CM | POA: Diagnosis not present

## 2022-07-29 DIAGNOSIS — R42 Dizziness and giddiness: Secondary | ICD-10-CM | POA: Diagnosis not present

## 2022-07-29 DIAGNOSIS — S60812A Abrasion of left wrist, initial encounter: Secondary | ICD-10-CM

## 2022-07-29 DIAGNOSIS — Z23 Encounter for immunization: Secondary | ICD-10-CM | POA: Diagnosis not present

## 2022-07-29 DIAGNOSIS — W19XXXA Unspecified fall, initial encounter: Secondary | ICD-10-CM | POA: Insufficient documentation

## 2022-07-29 LAB — COMPREHENSIVE METABOLIC PANEL
ALT: 13 U/L (ref 0–53)
AST: 17 U/L (ref 0–37)
Albumin: 4.1 g/dL (ref 3.5–5.2)
Alkaline Phosphatase: 58 U/L (ref 39–117)
BUN: 16 mg/dL (ref 6–23)
CO2: 27 mEq/L (ref 19–32)
Calcium: 9.5 mg/dL (ref 8.4–10.5)
Chloride: 105 mEq/L (ref 96–112)
Creatinine, Ser: 0.91 mg/dL (ref 0.40–1.50)
GFR: 76.08 mL/min (ref >60.00)
Glucose, Bld: 90 mg/dL (ref 70–99)
Potassium: 4.1 mEq/L (ref 3.5–5.1)
Sodium: 139 mEq/L (ref 135–145)
Total Bilirubin: 0.5 mg/dL (ref 0.2–1.2)
Total Protein: 7.7 g/dL (ref 6.0–8.3)

## 2022-07-29 LAB — CBC WITH DIFFERENTIAL/PLATELET
Basophils Absolute: 0 10*3/uL (ref 0.0–0.1)
Basophils Relative: 0.5 % (ref 0.0–3.0)
Eosinophils Absolute: 0.1 10*3/uL (ref 0.0–0.7)
Eosinophils Relative: 1.8 % (ref 0.0–5.0)
HCT: 38.2 % — ABNORMAL LOW (ref 39.0–52.0)
Hemoglobin: 12.8 g/dL — ABNORMAL LOW (ref 13.0–17.0)
Lymphocytes Relative: 37 % (ref 12.0–46.0)
Lymphs Abs: 2 10*3/uL (ref 0.7–4.0)
MCHC: 33.6 g/dL (ref 30.0–36.0)
MCV: 99.3 fl (ref 78.0–100.0)
Monocytes Absolute: 0.5 10*3/uL (ref 0.1–1.0)
Monocytes Relative: 9.6 % (ref 3.0–12.0)
Neutro Abs: 2.7 10*3/uL (ref 1.4–7.7)
Neutrophils Relative %: 51.1 % (ref 43.0–77.0)
Platelets: 274 10*3/uL (ref 150.0–400.0)
RBC: 3.85 Mil/uL — ABNORMAL LOW (ref 4.22–5.81)
RDW: 13.5 % (ref 11.5–15.5)
WBC: 5.3 10*3/uL (ref 4.0–10.5)

## 2022-07-29 LAB — VITAMIN B12: Vitamin B-12: 306 pg/mL (ref 211–911)

## 2022-07-29 NOTE — Assessment & Plan Note (Signed)
No significant injuries. No syncopal episode.

## 2022-07-29 NOTE — Assessment & Plan Note (Signed)
Occasionally feels lightheaded when he stands up. No syncopal episodes.  No signs or symptoms of GI bleeding. Stable vital signs.  Clinical stable. Could be related to medications. Different diagnosis discussed. Neurologically intact.  No signs of CVA. No head injury. No red flag signs or symptoms. ED precautions given. Blood work done today. Advised to follow-up with PCP within the next couple of weeks.

## 2022-07-29 NOTE — Progress Notes (Signed)
[name] 88 y.o.   Chief Complaint  Patient presents with   Dizziness    Pt stated that he fell and landed on his wrist. Pt states that it may be the medication he can be taking.    HISTORY OF PRESENT ILLNESS: Acute problem visit today.  Patient of [name]. This is a 88 y.o. male complaining of feeling lightheaded mostly when he stands up for the last couple weeks.  Sustained fall couple weeks ago and injured her left wrist sustaining abrasion.  No head injuries.  No syncopal episodes.  No new medications. No other associated symptoms. No other complaints or medical concerns today.  Dizziness Pertinent negatives include no abdominal pain, chest pain, chills, congestion, coughing, fever, headaches, nausea, rash, sore throat or vomiting.     Prior to Admission medications   Medication Sig Start Date End Date Taking? Authorizing Provider  abatacept (ORENCIA) 250 MG injection every 30 (thirty) days.   Yes [provider]  acetaminophen (TYLENOL) 500 MG tablet Take 1,000 mg by mouth every 6 (six) hours as needed.   Yes [provider]  acidophilus (RISAQUAD) CAPS capsule Take 1 capsule by mouth daily.   Yes [provider]  aspirin EC 81 MG tablet Take 81 mg by mouth daily.   Yes [provider]  donepezil (ARICEPT) 5 MG tablet TAKE ONE TABLET BY MOUTH EVERY MORNING 02/25/22  Yes Todd, Evie Lacks, MD  ELIQUIS 5 MG TABS tablet TAKE ONE TABLET BY MOUTH EVERY MORNING and TAKE ONE TABLET BY MOUTH EVERYDAY AT BEDTIME 05/07/22  Yes Turner, Traci R, MD  escitalopram (LEXAPRO) 20 MG tablet TAKE ONE TABLET BY MOUTH ONCE DAILY 02/15/22  Yes Todd, Evie Lacks, MD  Evolocumab (REPATHA SURECLICK) 284 MG/ML SOAJ INJECT 1 PEN INTO SKIN EVERY 14 DAYS 03/30/21  Yes Turner, Traci R, MD  ezetimibe (ZETIA) 10 MG tablet TAKE 1/2 TABLET BY MOUTH ONCE DAILY 02/12/22  Yes Todd, Evie Lacks, MD  lisinopril (ZESTRIL) 10 MG tablet Take 1 tablet (10 mg total) by  mouth daily. 03/30/21  Yes Turner, Eber Hong, MD  memantine (NAMENDA) 10 MG tablet Take 1 tablet (10 mg total) by mouth daily. 04/05/22  Yes Rondel Jumbo, PA-C  metoprolol tartrate (LOPRESSOR) 25 MG tablet TAKE ONE TABLET BY MOUTH EVERYDAY AT BEDTIME 05/14/22  Yes Allred, Jeneen Rinks, MD  nitroGLYCERIN (NITROSTAT) 0.4 MG SL tablet ONE TABLET UNDER TONGUE AS NEEDED FOR CHEST PAIN. MAY REPEAT IN 5 MINUTES AND AGAIN IN 5 MINUTES (TOTAL OF 3 TABLETS) 03/10/18  Yes Allred, Jeneen Rinks, MD  OLANZapine (ZYPREXA) 2.5 MG tablet Take 1 tablet (2.5 mg total) by mouth at bedtime. 02/08/22  Yes Todd, Evie Lacks, MD  omeprazole (PRILOSEC) 40 MG capsule TAKE ONE CAPSULE BY MOUTH ONCE DAILY 05/07/22  Yes Todd, Evie Lacks, MD  oxyCODONE-acetaminophen (PERCOCET/ROXICET) 5-325 MG tablet TAKE ONE TABLET EVERY EIGHT HOURS AS NEEDED FOR SEVERE PAIN 07/13/20  Yes Todd, Evie Lacks, MD  Propylene Glycol (SYSTANE BALANCE OP) Place 2 drops into both eyes daily as needed (dry eyes).    Yes [provider]  rosuvastatin (CRESTOR) 5 MG tablet TAKE ONE TABLET BY MOUTH ONCE DAILY 11/25/21  Yes Turner, Eber Hong, MD  valACYclovir (VALTREX) 500 MG tablet Take 1 tablet (500 mg total) by mouth 2 (two) times daily. 08/04/21  Yes Todd, Evie Lacks, MD  zolpidem (AMBIEN) 10 MG tablet TAKE 1/2 TO 1 TABLET AT BEDTIME AS NEEDED FOR SLEEP 05/12/22  Yes Todd, Evie Lacks,  MD    Allergies  Allergen Reactions   Methotrexate Other (See Comments)    REACTION: tachycardia   Aricept [Donepezil Hcl]     nightmares    Crestor [Rosuvastatin Calcium]     Myalgias with '20mg'$  dose   Lisinopril Other (See Comments)    cough   Namenda [Memantine]     n/d   Atorvastatin Other (See Comments)    Muscle pain, leg cramps   Pravastatin Sodium Other (See Comments)    REACTION: cramps, fatigue    Patient Active Problem List   Diagnosis Date Noted   DDD (degenerative disc disease), cervical 01/19/2022   Knee pain, left 01/19/2022   Dizzinesses  12/02/2021   Atherosclerosis of aorta ([place]) 03/02/2021   COVID-19 virus infection 11/15/2020   Weight loss 09/29/2020   Near syncope 09/04/2020   Chest pain    Dyspepsia    Encounter for medication management 11/14/2019   Angular stomatitis 07/09/2019   Cervical radiculopathy 06/05/2019   Pain in joint of left shoulder 05/31/2019   Concussion 04/17/2019   Hemoptysis 04/05/2019   Confusion 04/05/2019   Alzheimer's dementia ([place]) 12/21/2018   Nausea & vomiting 02/07/2018   Chills 02/07/2018   Rash 02/07/2018   Insomnia 09/22/2017   Cervical spondylolysis 06/28/2017   Hyperglycemia 06/22/2017   Well adult exam 11/18/2016   Hydronephrosis 11/18/2016   UPJ obstruction, 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   Allergic rhinitis 06/02/2016   Asthma with exacerbation 06/02/2016   Sinusitis, chronic 10/17/2015   OSA (obstructive sleep apnea) 06/19/2015   Cellulitis and abscess of leg, except foot 05/26/2015   Paroxysmal atrial fibrillation ([place]) 02/21/2015   CAP (community acquired pneumonia) 01/02/2015   Upper respiratory infection 12/02/2014   Vivid dream 09/25/2014   Actinic keratoses 04/26/2014   CAD (coronary artery disease) 02/25/2014   STEMI (ST elevation myocardial infarction) ([place]) 01/08/2014   Rheumatoid bursitis of left elbow ([place]) 08/08/2013   Situational depression 06/18/2013   Neck pain 04/23/2012   Acute abdominal pain in left flank 02/14/2012   Hypertension 09/23/2011   Chest pain, midsternal 09/13/2011   Mixed hyperlipidemia 07/23/2010   Palpitations 07/07/2010   Rheumatoid arthritis ([place]) 06/04/2010   PARESTHESIA 06/04/2010   Pain in joint 11/27/2009   MYALGIA 08/21/2009   ALLERGIC RHINITIS 06/11/2008   OSTEOARTHRITIS 06/11/2008   FATIGUE 06/11/2008   LOW BACK PAIN 07/27/2007   COLONIC POLYPS, HX OF 07/27/2007    Past Medical History:  Diagnosis Date   CAD (coronary artery disease)    a. s/p inferior STEMI 01/08/2014; LHC 01/08/14: total RCA occlusion s/p 3.5x56m  Xience DES distal RCA and 3.5x28 mm DES mid RCA, 60-70% mid LAD stenosis, EF 55%.   Complication of anesthesia    'long to wake up after back surgery " 09/2003   Diverticulosis of colon    GERD (gastroesophageal reflux disease)    History of cellulitis    05-26-2015  LLE   History of colon polyps    1998- benign/  2008 adenomatous    History of kidney stones    2013   History of squamous cell carcinoma excision    2013;  2015;  06-12-2015 right leg/  02/ and 05/ 2017  left ear and left leg   Hydronephrosis, left    Hyperlipidemia    Hypertension    Migraine with aura    OSA on CPAP 06/19/2015   Moderate OSA with AHI 18/hr  per study 05-20-2015   Osteoarthritis    Paroxysmal atrial fibrillation ([place] 4/16  chads2vasc score is at least 4   Premature atrial contractions    RA (rheumatoid arthritis) [facility])    rheumatologist-  dr Leigh Aurora   Sinusitis, chronic 10/17/2015   Wears glasses    Wears hearing aid    bilateral    Past Surgical History:  Procedure Laterality Date   BACK SURGERY     CARDIOVASCULAR STRESS TEST  11/21/2015   Low risk nuclear study w/ a small diaphragmatic attenuation artifact, no ischemia/  normal LV function and wall motion , ef 63%   CATARACT EXTRACTION W/ INTRAOCULAR LENS  IMPLANT, BILATERAL  2015   COLONOSCOPY  last one 06-09-2011   CORONARY ANGIOPLASTY     CYSTOSCOPY W/ RETROGRADES Left 07/12/2016   Procedure: CYSTOSCOPY WITH RETROGRADE PYELOGRAM LEFT URETERAL STENT;  Surgeon: Irine Seal, MD;  Location: [facility];  Service: Urology;  Laterality: Left;   CYSTOSCOPY WITH RETROGRADE PYELOGRAM, URETEROSCOPY AND STENT PLACEMENT Left 08/11/2016   Procedure: CYSTOSCOPY WITH RETROGRADE PYELOGRAM,  DIAGNOSTIC URETEROSCOPY , STENT EXCHANGE;  Surgeon: Alexis Frock, MD;  Location[facility];  Service: Urology;  Laterality: Left;   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   severe fibromatosis palm and fingers   LEFT HEART CATHETERIZATION  WITH CORONARY ANGIOGRAM N/A 01/08/2014   Procedure: LEFT HEART CATHETERIZATION WITH CORONARY ANGIOGRAM;  Surgeon: Troy Sine, MD;  Location: [facility] CATH LAB;  Service: Cardiovascular;  Laterality:N/A;  total/ subtotal RCA/  mLAD 38-46% w/ mid systolic bridging/  preserved global LVF, ef 55%   MOHS SURGERY  x2  feb and may 2017   left ear /  left leg  ([facility])   PERCUTANEOUS CORONARY STENT INTERVENTION (PCI-S)  01/08/2014   Procedure: PERCUTANEOUS CORONARY STENT INTERVENTION (PCI-S);  Surgeon: Troy Sine, MD;  Location: [facility] CATH LAB;  Service: Cardiovascular;;  DES to mid and distal RCA   POSTERIOR LUMBAR FUSION  10/01/2003   and Laminectomy/ diskectomy  L4 -- S1   ROBOT ASSISTED PYELOPLASTY Left 09/15/2016   Procedure: XI ROBOTIC ASSISTED PYELOPLASTY;  Surgeon: Alexis Frock, MD;  Location: [facility];  Service: Urology;  Laterality: Left;   TONSILLECTOMY     TRANSTHORACIC ECHOCARDIOGRAM  02/23/2016   mild LVH,  ef 50-55%,  grade 1 diastolic dysfunction/  mild to moderate AV calcification w/ no stenosis or regurg./  trivial MR and TR/  mild PR    Social History   Socioeconomic History   Marital status: Married    Spouse name: Not on file   Number of children: 2   Years of education: Not on file   Highest education level: Not on file  Occupational History   Occupation: Retired    Fish farm manager: RETIRED  Tobacco Use   Smoking status: Former    Years: 10.00    Types: Cigarettes    Quit date: 08/09/1968    Years since quitting: 54.0   Smokeless tobacco: Never  Vaping Use   Vaping Use: Never used  Substance and Sexual Activity   Alcohol use: Yes    Alcohol/week: 2.0 standard drinks of alcohol    Types: 2 Shots of liquor per week    Comment: daily   Drug use: No   Sexual activity: Yes    Partners: Female  Other Topics Concern   Not on file  Social History Narrative   1 Caffeine drink daily    Right handed       Lives with wife      Social Determinants of Health   Financial  Resource Strain: Low Risk  (06/25/2022)   Overall Financial Resource Strain (CARDIA)    Difficulty of Paying Living Expenses: Not hard at all  Food Insecurity: No Food Insecurity (06/25/2022)   Hunger Vital Sign    Worried About Running Out of Food in the Last Year: Never true    Ran Out of Food in the Last Year: Never true  Transportation Needs: No Transportation Needs (06/25/2022)   PRAPARE - Hydrologist (Medical): No    Lack of Transportation (Non-Medical): No  Physical Activity: Insufficiently Active (06/25/2022)   Exercise Vital Sign    Days of Exercise per Week: 3 days    Minutes of Exercise per Session: 30 min  Stress: No Stress Concern Present (06/25/2022)   [place]    Feeling of Stress : Not at all  Social Connections: Moderately Integrated (06/25/2022)   Social Connection and Isolation Panel [NHANES]    Frequency of Communication with Friends and Family: Three times a week    Frequency of Social Gatherings with Friends and Family: Three times a week    Attends Religious Services: More than 4 times per year    Active Member of Clubs or Organizations: No    Attends Archivist Meetings: Never    Marital Status: Married  Human resources officer Violence: Not At Risk (06/25/2022)   Humiliation, Afraid, Rape, and Kick questionnaire    Fear of Current or Ex-Partner: No    Emotionally Abused: No    Physically Abused: No    Sexually Abused: No    Family History  Problem Relation Age of Onset   Hypertension Mother    Heart disease Father    Colon cancer Neg Hx      Review of Systems  Constitutional: Negative.  Negative for chills and fever.  HENT: Negative.  Negative for congestion and sore throat.   Respiratory: Negative.  Negative for cough and shortness of breath.   Cardiovascular: Negative.  Negative for chest pain and palpitations.  Gastrointestinal:  Negative for  abdominal pain, nausea and vomiting.  Genitourinary: Negative.   Skin: Negative.  Negative for rash.  Neurological:  Positive for dizziness. Negative for headaches.  All other systems reviewed and are negative.  Vitals:   07/29/22 1311  BP: 112/60  Pulse: 70  Resp: (!) 96  Temp: 98 F (36.7 C)     Physical Exam Vitals reviewed.  Constitutional:      Appearance: Normal appearance.  HENT:     Head: Normocephalic.     Mouth/Throat:     Mouth: Mucous membranes are moist.     Pharynx: Oropharynx is clear.  Eyes:     Extraocular Movements: Extraocular movements intact.     Conjunctiva/sclera: Conjunctivae normal.     Pupils: Pupils are equal, round, and reactive to light.  Cardiovascular:     Rate and Rhythm: Normal rate and regular rhythm.     Pulses: Normal pulses.     Heart sounds: Normal heart sounds.  Pulmonary:     Effort: Pulmonary effort is normal.     Breath sounds: Normal breath sounds.  Abdominal:     Palpations: Abdomen is soft.     Tenderness: There is no abdominal tenderness.  Musculoskeletal:     Cervical back: No tenderness.  Lymphadenopathy:     Cervical: No cervical adenopathy.  Skin:    General: Skin is warm and dry.     Capillary Refill:  Capillary refill takes less than 2 seconds.  Neurological:     General: No focal deficit present.     Mental Status: He is alert and oriented to person, place, and time.     Cranial Nerves: No cranial nerve deficit.     Sensory: No sensory deficit.     Motor: No weakness.     Coordination: Coordination normal.     Gait: Gait normal.  Psychiatric:        Mood and Affect: Mood normal.        Behavior: Behavior normal.      ASSESSMENT & PLAN: Problem List Items Addressed This Visit       Cardiovascular and Mediastinum   Near syncope     Other   Dizzinesses - Primary    Occasionally feels lightheaded when he stands up. No syncopal episodes.  No signs or symptoms of GI bleeding. Stable vital signs.   Clinical stable. Could be related to medications. Different diagnosis discussed. Neurologically intact.  No signs of CVA. No head injury. No red flag signs or symptoms. ED precautions given. Blood work done today. Advised to follow-up with PCP within the next couple of weeks.      Relevant Orders   CBC with Differential/Platelet   Comprehensive metabolic panel   Vitamin K44   Accidental fall    No significant injuries. No syncopal episode.      Other Visit Diagnoses     Need for vaccination       Relevant Orders   Flu Vaccine QUAD High Dose(Fluad) (Completed)   Abrasion of left wrist, initial encounter          Patient Instructions  Dizziness Dizziness is a common problem. It makes you feel unsteady or light-headed. You may feel like you are about to pass out (faint). Dizziness can lead to getting hurt if you stumble or fall. Dizziness can be caused by many things, including: Medicines. Not having enough water in your body (dehydration). Illness. Follow these instructions at home: Eating and drinking  Drink enough fluid to keep your pee (urine) pale yellow. This helps to keep you from getting dehydrated. Try to drink more clear fluids, such as water. Do not drink alcohol. Limit how much caffeine you drink or eat, if your doctor tells you to do that. Limit how much salt (sodium) you drink or eat, if your doctor tells you to do that. Activity  Avoid making quick movements. Stand up slowly from sitting in a chair, and steady yourself until you feel okay. In the morning, first sit up on the side of the bed. When you feel okay, stand up slowly while you hold onto something. Do this until you know that your balance is okay. If you need to stand in one place for a long time, move your legs often. Tighten and relax the muscles in your legs while you are standing. Do not drive or use machinery if you feel dizzy. Avoid bending down if you feel dizzy. Place items in your home  so you can reach them easily without leaning over. Lifestyle Do not smoke or use any products that contain nicotine or tobacco. If you need help quitting, ask your doctor. Try to lower your stress level. You can do this by using methods such as yoga or meditation. Talk with your doctor if you need help. General instructions Watch your dizziness for any changes. Take over-the-counter and prescription medicines only as told by your doctor. Talk with your doctor  if you think that you are dizzy because of a medicine that you are taking. Tell a friend or a family member that you are feeling dizzy. If he or she notices any changes in your behavior, have this person call your doctor. Keep all follow-up visits. Contact a doctor if: Your dizziness does not go away. Your dizziness or light-headedness gets worse. You feel like you may vomit (are nauseous). You have trouble hearing. You have new symptoms. You are unsteady on your feet. You feel like the room is spinning. You have neck pain or a stiff neck. You have a fever. Get help right away if: You vomit or have watery poop (diarrhea), and you cannot eat or drink anything. You have trouble: Talking. Walking. Swallowing. Using your arms, hands, or legs. You feel generally weak. You are not thinking clearly, or you have trouble forming sentences. A friend or family member may notice this. You have: Chest pain. Pain in your belly (abdomen). Shortness of breath. Sweating. Your vision changes. You are bleeding. You have a very bad headache. These symptoms may be an emergency. Get help right away. Call your local emergency services (911 in the [place] Do not wait to see if the symptoms will go away. Do not drive yourself to the hospital. Summary Dizziness makes you feel unsteady or light-headed. You may feel like you are about to pass out (faint). Drink enough fluid to keep your pee (urine) pale yellow. Do not drink alcohol. Avoid making  quick movements if you feel dizzy. Watch your dizziness for any changes. This information is not intended to replace advice given to you by your health care provider. Make sure you discuss any questions you have with your health care provider. Document Revised: 10/06/2020 Document Reviewed: 10/06/2020 Elsevier Patient Education  2023 [place], MD [facility]

## 2022-07-29 NOTE — Patient Instructions (Signed)
Dizziness Dizziness is a common problem. It makes you feel unsteady or light-headed. You may feel like you are about to pass out (faint). Dizziness can lead to getting hurt if you stumble or fall. Dizziness can be caused by many things, including: Medicines. Not having enough water in your body (dehydration). Illness. Follow these instructions at home: Eating and drinking  Drink enough fluid to keep your pee (urine) pale yellow. This helps to keep you from getting dehydrated. Try to drink more clear fluids, such as water. Do not drink alcohol. Limit how much caffeine you drink or eat, if your doctor tells you to do that. Limit how much salt (sodium) you drink or eat, if your doctor tells you to do that. Activity  Avoid making quick movements. Stand up slowly from sitting in a chair, and steady yourself until you feel okay. In the morning, first sit up on the side of the bed. When you feel okay, stand up slowly while you hold onto something. Do this until you know that your balance is okay. If you need to stand in one place for a long time, move your legs often. Tighten and relax the muscles in your legs while you are standing. Do not drive or use machinery if you feel dizzy. Avoid bending down if you feel dizzy. Place items in your home so you can reach them easily without leaning over. Lifestyle Do not smoke or use any products that contain nicotine or tobacco. If you need help quitting, ask your doctor. Try to lower your stress level. You can do this by using methods such as yoga or meditation. Talk with your doctor if you need help. General instructions Watch your dizziness for any changes. Take over-the-counter and prescription medicines only as told by your doctor. Talk with your doctor if you think that you are dizzy because of a medicine that you are taking. Tell a friend or a family member that you are feeling dizzy. If he or she notices any changes in your behavior, have this  person call your doctor. Keep all follow-up visits. Contact a doctor if: Your dizziness does not go away. Your dizziness or light-headedness gets worse. You feel like you may vomit (are nauseous). You have trouble hearing. You have new symptoms. You are unsteady on your feet. You feel like the room is spinning. You have neck pain or a stiff neck. You have a fever. Get help right away if: You vomit or have watery poop (diarrhea), and you cannot eat or drink anything. You have trouble: Talking. Walking. Swallowing. Using your arms, hands, or legs. You feel generally weak. You are not thinking clearly, or you have trouble forming sentences. A friend or family member may notice this. You have: Chest pain. Pain in your belly (abdomen). Shortness of breath. Sweating. Your vision changes. You are bleeding. You have a very bad headache. These symptoms may be an emergency. Get help right away. Call your local emergency services (911 in the [place] Do not wait to see if the symptoms will go away. Do not drive yourself to the hospital. Summary Dizziness makes you feel unsteady or light-headed. You may feel like you are about to pass out (faint). Drink enough fluid to keep your pee (urine) pale yellow. Do not drink alcohol. Avoid making quick movements if you feel dizzy. Watch your dizziness for any changes. This information is not intended to replace advice given to you by your health care provider. Make sure you discuss any questions   you have with your health care provider. Document Revised: 10/06/2020 Document Reviewed: 10/06/2020 Elsevier Patient Education  2023 [employer] Inc[employer]  

## 2022-08-10 DIAGNOSIS — E663 Overweight: Secondary | ICD-10-CM | POA: Diagnosis not present

## 2022-08-10 DIAGNOSIS — Z6826 Body mass index (BMI) 26.0-26.9, adult: Secondary | ICD-10-CM | POA: Diagnosis not present

## 2022-08-10 DIAGNOSIS — M0609 Rheumatoid arthritis without rheumatoid factor, multiple sites: Secondary | ICD-10-CM | POA: Diagnosis not present

## 2022-08-10 DIAGNOSIS — M5030 Other cervical disc degeneration, unspecified cervical region: Secondary | ICD-10-CM | POA: Diagnosis not present

## 2022-08-10 DIAGNOSIS — M25562 Pain in left knee: Secondary | ICD-10-CM | POA: Diagnosis not present

## 2022-08-13 DIAGNOSIS — M0609 Rheumatoid arthritis without rheumatoid factor, multiple sites: Secondary | ICD-10-CM | POA: Diagnosis not present

## 2022-08-18 DIAGNOSIS — L570 Actinic keratosis: Secondary | ICD-10-CM | POA: Diagnosis not present

## 2022-08-18 DIAGNOSIS — D1801 Hemangioma of skin and subcutaneous tissue: Secondary | ICD-10-CM | POA: Diagnosis not present

## 2022-08-18 DIAGNOSIS — Z85828 Personal history of other malignant neoplasm of skin: Secondary | ICD-10-CM | POA: Diagnosis not present

## 2022-08-18 DIAGNOSIS — L821 Other seborrheic keratosis: Secondary | ICD-10-CM | POA: Diagnosis not present

## 2022-08-18 DIAGNOSIS — L853 Xerosis cutis: Secondary | ICD-10-CM | POA: Diagnosis not present

## 2022-08-18 DIAGNOSIS — L812 Freckles: Secondary | ICD-10-CM | POA: Diagnosis not present

## 2022-08-18 DIAGNOSIS — L111 Transient acantholytic dermatosis [Grover]: Secondary | ICD-10-CM | POA: Diagnosis not present

## 2022-09-03 ENCOUNTER — Other Ambulatory Visit: Payer: Self-pay | Admitting: Internal Medicine

## 2022-09-10 DIAGNOSIS — M0609 Rheumatoid arthritis without rheumatoid factor, multiple sites: Secondary | ICD-10-CM | POA: Diagnosis not present

## 2022-09-20 ENCOUNTER — Other Ambulatory Visit (HOSPITAL_BASED_OUTPATIENT_CLINIC_OR_DEPARTMENT_OTHER): Payer: Self-pay

## 2022-09-22 ENCOUNTER — Other Ambulatory Visit: Payer: Self-pay

## 2022-09-22 ENCOUNTER — Other Ambulatory Visit: Payer: Self-pay | Admitting: Orthopedic Surgery

## 2022-09-22 ENCOUNTER — Encounter (HOSPITAL_COMMUNITY): Payer: Self-pay | Admitting: Emergency Medicine

## 2022-09-22 ENCOUNTER — Emergency Department (HOSPITAL_COMMUNITY): Payer: Medicare Other

## 2022-09-22 ENCOUNTER — Emergency Department (HOSPITAL_COMMUNITY)
Admission: EM | Admit: 2022-09-22 | Discharge: 2022-09-22 | Disposition: A | Discharge status: Skilled Nursing Facility | Payer: Medicare Other | Attending: Emergency Medicine | Admitting: Emergency Medicine

## 2022-09-22 DIAGNOSIS — M4319 Spondylolisthesis, multiple sites in spine: Secondary | ICD-10-CM | POA: Diagnosis not present

## 2022-09-22 DIAGNOSIS — W19XXXA Unspecified fall, initial encounter: Secondary | ICD-10-CM

## 2022-09-22 DIAGNOSIS — Z79899 Other long term (current) drug therapy: Secondary | ICD-10-CM | POA: Diagnosis not present

## 2022-09-22 DIAGNOSIS — R0781 Pleurodynia: Secondary | ICD-10-CM | POA: Diagnosis not present

## 2022-09-22 DIAGNOSIS — R6889 Other general symptoms and signs: Secondary | ICD-10-CM | POA: Diagnosis not present

## 2022-09-22 DIAGNOSIS — Y9289 Other specified places as the place of occurrence of the external cause: Secondary | ICD-10-CM | POA: Insufficient documentation

## 2022-09-22 DIAGNOSIS — F0390 Unspecified dementia without behavioral disturbance: Secondary | ICD-10-CM | POA: Diagnosis not present

## 2022-09-22 DIAGNOSIS — R58 Hemorrhage, not elsewhere classified: Secondary | ICD-10-CM | POA: Diagnosis not present

## 2022-09-22 DIAGNOSIS — G4489 Other headache syndrome: Secondary | ICD-10-CM | POA: Diagnosis not present

## 2022-09-22 DIAGNOSIS — M47813 Spondylosis without myelopathy or radiculopathy, cervicothoracic region: Secondary | ICD-10-CM | POA: Diagnosis not present

## 2022-09-22 DIAGNOSIS — Z7901 Long term (current) use of anticoagulants: Secondary | ICD-10-CM | POA: Diagnosis not present

## 2022-09-22 DIAGNOSIS — Z23 Encounter for immunization: Secondary | ICD-10-CM | POA: Insufficient documentation

## 2022-09-22 DIAGNOSIS — I10 Essential (primary) hypertension: Secondary | ICD-10-CM | POA: Diagnosis not present

## 2022-09-22 DIAGNOSIS — F03911 Unspecified dementia, unspecified severity, with agitation: Secondary | ICD-10-CM

## 2022-09-22 DIAGNOSIS — I2510 Atherosclerotic heart disease of native coronary artery without angina pectoris: Secondary | ICD-10-CM | POA: Diagnosis not present

## 2022-09-22 DIAGNOSIS — W01198A Fall on same level from slipping, tripping and stumbling with subsequent striking against other object, initial encounter: Secondary | ICD-10-CM | POA: Diagnosis not present

## 2022-09-22 DIAGNOSIS — Z7982 Long term (current) use of aspirin: Secondary | ICD-10-CM | POA: Diagnosis not present

## 2022-09-22 DIAGNOSIS — S060X0A Concussion without loss of consciousness, initial encounter: Secondary | ICD-10-CM | POA: Diagnosis not present

## 2022-09-22 DIAGNOSIS — S0990XA Unspecified injury of head, initial encounter: Secondary | ICD-10-CM | POA: Diagnosis not present

## 2022-09-22 DIAGNOSIS — S0181XA Laceration without foreign body of other part of head, initial encounter: Secondary | ICD-10-CM | POA: Insufficient documentation

## 2022-09-22 DIAGNOSIS — Z743 Need for continuous supervision: Secondary | ICD-10-CM | POA: Diagnosis not present

## 2022-09-22 DIAGNOSIS — S0101XA Laceration without foreign body of scalp, initial encounter: Secondary | ICD-10-CM | POA: Diagnosis not present

## 2022-09-22 MED ORDER — LIDOCAINE-EPINEPHRINE (PF) 2 %-1:200000 IJ SOLN
20.0000 mL | Freq: Once | INTRAMUSCULAR | Status: AC
  Administered 2022-09-22: 20 mL
  Filled 2022-09-22: qty 20

## 2022-09-22 MED ORDER — LORAZEPAM 0.5 MG PO TABS: 0.5000 mg | ORAL_TABLET | Freq: Two times a day (BID) | ORAL | 0 refills | Status: DC | PRN

## 2022-09-22 MED ORDER — TETANUS-DIPHTH-ACELL PERTUSSIS 5-2.5-18.5 LF-MCG/0.5 IM SUSY
0.5000 mL | PREFILLED_SYRINGE | Freq: Once | INTRAMUSCULAR | Status: AC
  Administered 2022-09-22: 0.5 mL via INTRAMUSCULAR
  Filled 2022-09-22: qty 0.5

## 2022-09-22 NOTE — ED Notes (Signed)
Trauma Response Nurse Documentation   [name] is a 88 y.o. male arriving to [facility] ED via EMS  On Eliquis (apixaban) daily. Trauma was activated as a Level 2 by ED charge RN based on the following trauma criteria Elderly patients > 65 with head trauma on anti-coagulation (excluding ASA). Trauma team at the bedside on patient arrival.   Patient cleared for CT by Dr. Dina Todd EDP. Pt transported to CT with trauma response nurse present to monitor. RN remained with the patient throughout their absence from the department for clinical observation.   GCS 15.  History   Past Medical History:  Diagnosis Date   CAD (coronary artery disease)    a. s/p inferior STEMI 01/08/2014; LHC 01/08/14: total RCA occlusion s/p 3.5x41m Xience DES distal RCA and 3.5x28 mm DES mid RCA, 60-70% mid LAD stenosis, EF 55%.   Complication of anesthesia    'long to wake up after back surgery " 09/2003   Diverticulosis of colon    GERD (gastroesophageal reflux disease)    History of cellulitis    05-26-2015  LLE   History of colon polyps    1998- benign/  2008 adenomatous    History of kidney stones    2013   History of squamous cell carcinoma excision    2013;  2015;  06-12-2015 right leg/  02/ and 05/ 2017  left ear and left leg   Hydronephrosis, left    Hyperlipidemia    Hypertension    Migraine with aura    OSA on CPAP 06/19/2015   Moderate OSA with AHI 18/hr  per study 05-20-2015   Osteoarthritis    [place] 4[place]   chads2vasc score is at least 4   Premature atrial contractions    RA (rheumatoid arthritis) ([facility]    rheumatologist-  dr [name]  Sinusitis, chronic 10/17/2015   Wears glasses    Wears hearing aid    bilateral     Past Surgical History:  Procedure Laterality Date   BACK SURGERY     CARDIOVASCULAR STRESS TEST  11/21/2015   Low risk nuclear study w/ a small diaphragmatic attenuation artifact, no ischemia/  normal LV function and wall motion , ef  63%   CATARACT EXTRACTION W/ INTRAOCULAR LENS  IMPLANT, BILATERAL  2015   COLONOSCOPY  last one 06-09-2011   CORONARY ANGIOPLASTY     CYSTOSCOPY W/ RETROGRADES Left 07/12/2016   Procedure: CYSTOSCOPY WITH RETROGRADE PYELOGRAM LEFT URETERAL STENT;  Surgeon: Evan Seal MD;  Location: [facility];  Service: Urology;  Laterality: Left;   CYSTOSCOPY WITH RETROGRADE PYELOGRAM, URETEROSCOPY AND STENT PLACEMENT Left 08/11/2016   Procedure: CYSTOSCOPY WITH RETROGRADE PYELOGRAM,  DIAGNOSTIC URETEROSCOPY , STENT EXCHANGE;  Surgeon: TAlexis Frock MD;  [facility]  Service: Urology;  Laterality: Left;   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   severe fibromatosis palm and fingers   LEFT HEART CATHETERIZATION WITH CORONARY ANGIOGRAM N/A 01/08/2014   Procedure: LEFT HEART CATHETERIZATION WITH CORONARY ANGIOGRAM;  Surgeon: TTroy Sine MD;  [facility];  Service: Cardiovascular;  Laterality:N/A;  total/ subtotal RCA/  mLAD 635-57%w/ mid systolic bridging/  preserved global LVF, ef 55%   MOHS SURGERY  x2  feb and may 2017   left ear /  left leg  ([facility])   PERCUTANEOUS CORONARY STENT INTERVENTION (PCI-S)  01/08/2014   Procedure: PERCUTANEOUS CORONARY STENT INTERVENTION (PCI-S);  Surgeon: TTroy Sine MD;  [facility];  Service: Cardiovascular;;  DES to mid and distal RCA   POSTERIOR LUMBAR FUSION  10/01/2003   and Laminectomy/ diskectomy  L4 -- S1   ROBOT ASSISTED PYELOPLASTY Left 09/15/2016   Procedure: XI ROBOTIC ASSISTED PYELOPLASTY;  Surgeon: Evan Frock, MD;  Location: [facility];  Service: Urology;  Laterality: Left;   TONSILLECTOMY     TRANSTHORACIC ECHOCARDIOGRAM  02/23/2016   mild LVH,  ef 50-55%,  grade 1 diastolic dysfunction/  mild to moderate AV calcification w/ no stenosis or regurg./  trivial MR and TR/  mild PR       Initial Focused Assessment (If applicable, or please see trauma documentation): Alert/oriented male presents via EMS from  [facility] after a ground level fall resulting in forehead laceration Airway patent/unobstructed, BS clear No obvious uncontrolled hemorrhage, bleeding to forehead lac controlled with guaze dressing GCS 15 PERRLA 2  CT's Completed:   CT Head CT cervical spine  Interventions:  Trauma lab draw XRAYS deferred by EDP CT head and cervical spine TDAP Wound care, lac repair Family presence, updates  Plan for disposition:  Pending imaging  Consults completed:  none at the time of this note.  Event Summary: Patient arrives after a ground level fall resulting in mid forehead laceration. On eliquis, bleeding controlled with guaze dressing. TDAP updated. Wife at bedside.  MTP Summary (If applicable): NA  Bedside handoff with ED RN [name].    [name]  Trauma Response RN  Please call TRN at [phone] for further assistance.

## 2022-09-22 NOTE — ED Notes (Addendum)
Wife arrived and was updated.  She stated what while he can usually answer orientation questions he does have baseline dementia.

## 2022-09-22 NOTE — ED Notes (Signed)
Pt to CT

## 2022-09-22 NOTE — ED Triage Notes (Signed)
Pt from [place] in the [facility] living section, got up out of bed to use the bathroom, tripped and fell forward hitting his forehead.  No LOC , bleeding is under control.  Is able to answer orientation questions, neuro in tact.    On Eliquis, 180/102 Pulse 60 RR 16 98% RA CBG 120  18G L forarm SR on monitor

## 2022-09-22 NOTE — Discharge Instructions (Signed)
You were seen today after a fall.  Make sure that you are being very careful when ambulating in your apartment.  Your stitches are absorbable.  Keep wound cleaned and dressed.  You may apply antibiotic ointment.  Monitor for signs and symptoms of infection.  You may have suffered a minor concussion.  Make sure that you are getting plenty of rest.  Follow-up with your primary physician.

## 2022-09-22 NOTE — ED Notes (Addendum)
Pt reported some left chest pain when he "takes a deep breath."  Provider aware and ordered an Xray. Pt to Xray now.

## 2022-09-22 NOTE — ED Provider Notes (Signed)
[facility] EMERGENCY DEPARTMENT Provider Note   CSN: [id number] Arrival date & time: 09/22/22  0320     History  Chief Complaint  Patient presents with   [name] is a 88 y.o. male.  HPI     This is an 88 year old male with a history of hypertension, atrial fibrillation on Eliquis, mild dementia who presents following a fall.  Patient reports that he got up to go the restroom multiple times tonight.  He reports tripping and falling.  He did not lose consciousness.  He hit his head.  He subsequently called his wife.  He was noted to have significant trauma to the forehead including approximately 6 cm laceration.  Denies any other pain.  Denies hip or knee pain.  Denies neck pain.  He is only reporting headache.  No vomiting.  Home Medications Prior to Admission medications   Medication Sig Start Date End Date Taking? Authorizing Provider  abatacept (ORENCIA) 250 MG injection every 30 (thirty) days.    [provider]  acetaminophen (TYLENOL) 500 MG tablet Take 1,000 mg by mouth every 6 (six) hours as needed.    [provider]  acidophilus (RISAQUAD) CAPS capsule Take 1 capsule by mouth daily.    [provider]  aspirin EC 81 MG tablet Take 81 mg by mouth daily.    [provider]  donepezil (ARICEPT) 5 MG tablet TAKE ONE TABLET BY MOUTH EVERY MORNING 02/25/22   Plotnikov, Evie Lacks, MD  ELIQUIS 5 MG TABS tablet TAKE ONE TABLET BY MOUTH EVERY MORNING and TAKE ONE TABLET BY MOUTH EVERYDAY AT BEDTIME 05/07/22   Turner, Eber Hong, MD  escitalopram (LEXAPRO) 20 MG tablet TAKE ONE TABLET BY MOUTH ONCE DAILY 02/15/22   Plotnikov, Evie Lacks, MD  Evolocumab (REPATHA SURECLICK) 056 MG/ML SOAJ INJECT 1 PEN INTO SKIN EVERY 14 DAYS 03/30/21   Sueanne Margarita, MD  ezetimibe (ZETIA) 10 MG tablet TAKE 1/2 TABLET BY MOUTH ONCE DAILY 02/12/22   Plotnikov, Evie Lacks, MD  lisinopril (ZESTRIL) 10 MG tablet Take 1 tablet (10 mg total) by mouth  daily. 03/30/21   Sueanne Margarita, MD  memantine (NAMENDA) 10 MG tablet Take 1 tablet (10 mg total) by mouth daily. 04/05/22   Rondel Jumbo, PA-C  metoprolol tartrate (LOPRESSOR) 25 MG tablet TAKE ONE TABLET BY MOUTH EVERYDAY AT BEDTIME 05/14/22   Allred, Jeneen Rinks, MD  nitroGLYCERIN (NITROSTAT) 0.4 MG SL tablet ONE TABLET UNDER TONGUE AS NEEDED FOR CHEST PAIN. MAY REPEAT IN 5 MINUTES AND AGAIN IN 5 MINUTES (TOTAL OF 3 TABLETS) 03/10/18   Allred, Jeneen Rinks, MD  OLANZapine (ZYPREXA) 2.5 MG tablet Take 1 tablet (2.5 mg total) by mouth at bedtime. 02/08/22   Plotnikov, Evie Lacks, MD  omeprazole (PRILOSEC) 40 MG capsule TAKE ONE CAPSULE BY MOUTH ONCE DAILY 05/07/22   Plotnikov, Evie Lacks, MD  oxyCODONE-acetaminophen (PERCOCET/ROXICET) 5-325 MG tablet TAKE ONE TABLET EVERY EIGHT HOURS AS NEEDED FOR SEVERE PAIN 07/13/20   Plotnikov, Evie Lacks, MD  Propylene Glycol (SYSTANE BALANCE OP) Place 2 drops into both eyes daily as needed (dry eyes).     [provider]  rosuvastatin (CRESTOR) 5 MG tablet TAKE ONE TABLET BY MOUTH ONCE DAILY 11/25/21   Sueanne Margarita, MD  valACYclovir (VALTREX) 500 MG tablet Take 1 tablet (500 mg total) by mouth 2 (two) times daily. 08/04/21   Plotnikov, Evie Lacks, MD  zolpidem (AMBIEN) 10 MG tablet take 1/2 to 1  tablet at bedtime as needed for sleep 09/06/22   Plotnikov, Evie Lacks, MD      Allergies    Methotrexate, Aricept [donepezil hcl], Crestor [rosuvastatin calcium], Lisinopril, Namenda [memantine], Atorvastatin, and Pravastatin sodium    Review of Systems   Review of Systems  Skin:  Positive for wound.  Neurological:  Positive for headaches. Negative for syncope.  All other systems reviewed and are negative.   Physical Exam Updated Vital Signs BP (!) 151/96   Pulse 60   Temp (!) 97.4 F (36.3 C) (Oral)   Resp 13   Ht 1.829 m (6')   Wt 83.9 kg   SpO2 96%   BMI 25.09 kg/m  Physical Exam Vitals and nursing note reviewed.  Constitutional:      Appearance: He is  well-developed. He is not ill-appearing.     Comments: ABCs intact  HENT:     Head: Normocephalic.     Comments: 6 cm jagged laceration horizontally across the forehead, oozing    Nose: Nose normal.     Mouth/Throat:     Mouth: Mucous membranes are moist.  Eyes:     Pupils: Pupils are equal, round, and reactive to light.  Neck:     Comments: C-collar in place Cardiovascular:     Rate and Rhythm: Normal rate and regular rhythm.     Heart sounds: Normal heart sounds. No murmur heard. Pulmonary:     Effort: Pulmonary effort is normal. No respiratory distress.     Breath sounds: Normal breath sounds. No wheezing.  Abdominal:     Palpations: Abdomen is soft.     Tenderness: There is no abdominal tenderness. There is no rebound.  Musculoskeletal:     Comments: Normal range of motion bilateral hips and knees, no obvious deformities  Skin:    General: Skin is warm and dry.  Neurological:     Mental Status: He is alert and oriented to person, place, and time.     Comments: Oriented x3, some repetitive questioning  Psychiatric:        Mood and Affect: Mood normal.     ED Results / Procedures / Treatments   Labs (all labs ordered are listed, but only abnormal results are displayed) Labs Reviewed - No data to display  EKG EKG Interpretation  Date/Time:  Wednesday September 22 2022 03:41:33 EST Ventricular Rate:  62 PR Interval:  188 QRS Duration: 90 QT Interval:  446 QTC Calculation: 452 R Axis:   12 Text Interpretation: Normal sinus rhythm Normal ECG When compared with ECG of 15-Feb-2021 10:47, PREVIOUS ECG IS PRESENT Confirmed by Thayer Jew [phone]) on 09/22/2022 6:18:30 AM  Radiology DG Chest 2 View  Result Date: 09/22/2022 CLINICAL DATA:  Chest and rib pain after a fall. EXAM: CHEST - 2 VIEW COMPARISON:  02/15/2021 FINDINGS: 0509 hours. Low volume film. Cardiopericardial silhouette is at upper limits of normal for size. The lungs are clear without focal pneumonia,  edema, pneumothorax or pleural effusion. The visualized bony structures of the thorax are unremarkable. Telemetry leads overlie the chest. IMPRESSION: Low volume film without acute cardiopulmonary findings. Electronically Signed   By: Misty Stanley M.D.   On: 09/22/2022 05:41   CT Cervical Spine Wo Contrast  Result Date: 09/22/2022 CLINICAL DATA:  88 year old male status post fall while going to the bathroom. Prior cervical fusion. EXAM: CT CERVICAL SPINE WITHOUT CONTRAST TECHNIQUE: Multidetector CT imaging of the cervical spine was performed without intravenous contrast. Multiplanar CT image reconstructions were also generated.  RADIATION DOSE REDUCTION: This exam was performed according to the departmental dose-optimization program which includes automated exposure control, adjustment of the mA and/or kV according to patient size and/or use of iterative reconstruction technique. COMPARISON:  Cervical spine MRI 07/04/2019. Preoperative cervical spine radiographs 04/04/2017. FINDINGS: Alignment: Chronic straightening and mild reversal of cervical lordosis is stable. Mild chronic anterolisthesis of C7 on T1 is stable. Bilateral posterior element alignment is within normal limits. Skull base and vertebrae: Visualized skull base is intact. No atlanto-occipital dissociation. C1 and C2 appear intact and aligned, with a degenerative subchondral cyst at the left base of the odontoid which is new since 2020. Postoperative details are below. No acute osseous abnormality identified. Soft tissues and spinal canal: No prevertebral fluid or swelling. No visible canal hematoma. Negative noncontrast neck soft tissues aside from calcified carotid atherosclerosis. Disc levels: Chronic C4-C5 and C5-C6 ACDF with solid arthrodesis and no hardware loosening. Acquired adjacent segment ankylosis at C3-C4, and also C6-C7. Superimposed degenerative spondylolisthesis at both the C2-C3 and C7-T1 segments which are adjacent to those. Facet  arthropathy at both levels. However, capacious spinal canal is demonstrated by MRI in 2020. Doubt spinal stenosis. Upper chest: Mild chronic T1 and T2 endplate deformities were present on the 2020 MRI. Other visible upper thoracic levels appear grossly intact. Calcified aortic atherosclerosis. Negative lung apices. IMPRESSION: 1. No acute traumatic injury identified in the cervical spine. 2. Chronic C4 through C6 ACDF with solid arthrodesis, and additional acquired ankylosis of both adjacent segments at C3-C4 and C6-C7. Subsequent C2-C3 and C7-T1 degeneration including spondylolisthesis and facet arthropathy. 3.  Aortic Atherosclerosis (ICD10-I70.0). Electronically Signed   By: Genevie Ann M.D.   On: 09/22/2022 04:19   CT Head Wo Contrast  Result Date: 09/22/2022 CLINICAL DATA:  88 year old male status post fall while going to the bathroom. EXAM: CT HEAD WITHOUT CONTRAST TECHNIQUE: Contiguous axial images were obtained from the base of the skull through the vertex without intravenous contrast. RADIATION DOSE REDUCTION: This exam was performed according to the departmental dose-optimization program which includes automated exposure control, adjustment of the mA and/or kV according to patient size and/or use of iterative reconstruction technique. COMPARISON:  Cervical spine CT today.  Head CT 11/10/2021. FINDINGS: Brain: Stable cerebral volume since last year. Ex vacuo appearing ventricular enlargement with confluent bilateral cerebral white matter hypodensity superimposed. Chronic mixed density but predominantly low-density mass at the central skull base is intra sellar with suprasellar extension to the level of the optic chiasm. This is most apparent on sagittal image 32, up to 17 mm long axis, and unchanged since 04/10/2019. No other intracranial mass identified. No midline shift, mass effect, intracranial hemorrhage or evidence of cortically based acute infarction. Stable gray-white matter differentiation  throughout the brain. Vascular: Calcified atherosclerosis at the skull base. No suspicious intracranial vascular hyperdensity. Skull: Stable and intact. Sinuses/Orbits: Visualized paranasal sinuses and mastoids are stable and well aerated. Other: Forehead scalp soft tissue laceration and deficiency with mild regional hematoma and/or contusion affecting the forehead to the right of midline. Underlying frontal sinuses and frontal bones appear intact. Orbits soft tissues remain within normal limits. IMPRESSION: 1. Forehead scalp soft tissue injury without underlying skull fracture. 2. No acute intracranial abnormality. Chronic cerebral white matter disease. And chronic midline intra-sellar/suprasellar mass which abuts the optic chiasm, but has not significantly changed by CT since 04/10/2019. Favor a chronic Pituitary Macroadenoma. Electronically Signed   By: Genevie Ann M.D.   On: 09/22/2022 04:15    Procedures .Marland KitchenLaceration Repair  Date/Time: 09/22/2022 5:22 AM  Performed by: Merryl Hacker, MD Authorized by: Merryl Hacker, MD   Consent:    Consent obtained:  Verbal   Consent given by:  Patient and spouse   Risks, benefits, and alternatives were discussed: yes     Risks discussed:  Pain, poor cosmetic result and poor wound healing   Alternatives discussed:  No treatment Anesthesia:    Anesthesia method:  Local infiltration   Local anesthetic:  Lidocaine 2% WITH epi Laceration details:    Location:  Face   Face location:  Forehead   Length (cm):  6   Depth (mm):  8 Pre-procedure details:    Preparation:  Patient was prepped and draped in usual sterile fashion Exploration:    Limited defect created (wound extended): no     Hemostasis achieved with:  Epinephrine and direct pressure   Wound exploration: wound explored through full range of motion     Wound extent: no foreign body, no signs of injury and no vascular damage   Treatment:    Area cleansed with:  Chlorhexidine and saline    Amount of cleaning:  Standard   Irrigation solution:  Sterile saline   Irrigation method:  Pressure wash   Visualized foreign bodies/material removed: no     Debridement:  None   Undermining:  None   Scar revision: no     Layers/structures repaired:  Deep dermal/superficial fascia Deep dermal/superficial fascia:    Suture size:  4-0   Suture material:  Vicryl   Suture technique:  Simple interrupted   Number of sutures:  3 Skin repair:    Repair method:  Sutures   Suture size:  5-0   Suture material:  Fast-absorbing gut   Suture technique:  Simple interrupted   Number of sutures:  12 Approximation:    Approximation:  Close Repair type:    Repair type:  Intermediate Post-procedure details:    Dressing:  Antibiotic ointment and bulky dressing   Procedure completion:  Tolerated     Medications Ordered in ED Medications  lidocaine-EPINEPHrine (XYLOCAINE W/EPI) 2 %-1:200000 (PF) injection 20 mL (20 mLs Infiltration Given by Other 09/22/22 0402)  Tdap (BOOSTRIX) injection 0.5 mL (0.5 mLs Intramuscular Given 09/22/22 0446)    ED Course/ Medical Decision Making/ A&P Clinical Course as of 09/22/22 0623  Wed Sep 22, 2022  0502 CT scans reviewed.  No evidence of head bleed.  C-collar was cleared.  He does have some repetitive questioning.  Likely mild concussion.  Wife also indicates that he has some mild dementia.  He is complaining of some left-sided rib and chest discomfort.  EKG without ischemic changes.  Will obtain a chest x-ray.  Given tenderness on exam, suspect traumatic related. [CH]    Clinical Course User Index [CH] Jaidyn Usery, Barbette Hair, MD                           Medical Decision Making Amount and/or Complexity of Data Reviewed Radiology: ordered.  Risk Prescription drug management.   This patient presents to the ED for concern of fall, this involves an extensive number of treatment options, and is a complaint that carries with it a high risk of complications and  morbidity.  I considered the following differential and admission for this acute, potentially life threatening condition.  The differential diagnosis includes head trauma, other trauma such as rib fractures  MDM:    This is an 88 year old male  who presents following a fall.  He has notable trauma to the forehead.  He is awake, alert, oriented x3.  He does have some repetitive questioning.  He does provide a history of a mechanical fall.  Denies syncope.  EKG without any evidence of acute ischemic or arrhythmic changes.  CT head neck obtained.  This was independently reviewed by myself.  CT scan without evidence of acute traumatic injury including head bleed or C-spine fracture.  C-spine was cleared at bedside.  He did subsequently complain of some chest discomfort.  This was reproducible on exam.  No crepitus.  Chest x-ray without obvious rib fracture, pneumothorax.  Laceration was repaired at the bedside.  I updated the patient's wife including incidental findings from CT scan.  Given his repetitive questioning, suspect he may have a mild concussion which is exacerbated his underlying dementia.  They live at [place].  She states that she feels comfortable taking him back and may look into transitioning him to rehab.  Patient was ambulatory at his baseline.  (Labs, imaging, consults)  Labs: I Ordered, and personally interpreted labs.  The pertinent results include: None  Imaging Studies ordered: I ordered imaging studies including CT head, cervical spine, x-ray I independently visualized and interpreted imaging. I agree with the radiologist interpretation  Additional history obtained from wife at bedside.  External records from outside source obtained and reviewed including prior evaluations  Cardiac Monitoring: The patient was maintained on a cardiac monitor.  I personally viewed and interpreted the cardiac monitored which showed an underlying rhythm of: Sinus rhythm  Reevaluation: After  the interventions noted above, I reevaluated the patient and Todd that they have :improved  Social Determinants of Health:  lives with wife in independent living  Disposition: Discharge  Co morbidities that complicate the patient evaluation  Past Medical History:  Diagnosis Date   CAD (coronary artery disease)    a. s/p inferior STEMI 01/08/2014; LHC 01/08/14: total RCA occlusion s/p 3.5x2m Xience DES distal RCA and 3.5x28 mm DES mid RCA, 60-70% mid LAD stenosis, EF 55%.   Complication of anesthesia    'long to wake up after back surgery " 09/2003   Diverticulosis of colon    GERD (gastroesophageal reflux disease)    History of cellulitis    05-26-2015  LLE   History of colon polyps    1998- benign/  2008 adenomatous    History of kidney stones    2013   History of squamous cell carcinoma excision    2013;  2015;  06-12-2015 right leg/  02/ and 05/ 2017  left ear and left leg   Hydronephrosis, left    Hyperlipidemia    Hypertension    Migraine with aura    OSA on CPAP 06/19/2015   Moderate OSA with AHI 18/hr  per study 05-20-2015   Osteoarthritis    [place]   chads2vasc score is at least 4   Premature atrial contractions    RA (rheumatoid arthritis) ([facility]    rheumatologist-  dr jLeigh [place]  Sinusitis, chronic 10/17/2015   Wears glasses    Wears hearing aid    bilateral     Medicines Meds ordered this encounter  Medications   lidocaine-EPINEPHrine (XYLOCAINE W/EPI) 2 %-1:200000 (PF) injection 20 mL   Tdap (BOOSTRIX) injection 0.5 mL    I have reviewed the patients home medicines and have made adjustments as needed  Problem List / ED Course: Problem List Items Addressed This Visit  Other   Concussion   Other Visit Diagnoses     Fall, initial encounter    -  Primary   Laceration of forehead, initial encounter                       Final Clinical Impression(s) / ED Diagnoses Final diagnoses:  Fall,  initial encounter  Laceration of forehead, initial encounter  Concussion without loss of consciousness, initial encounter    Rx / DC Orders ED Discharge Orders     None         Tracer Gutridge, Barbette Hair, MD 09/22/22 [phone]

## 2022-09-23 ENCOUNTER — Non-Acute Institutional Stay (SKILLED_NURSING_FACILITY): Payer: Medicare Other | Admitting: Adult Health

## 2022-09-23 ENCOUNTER — Encounter: Payer: Self-pay | Admitting: Adult Health

## 2022-09-23 ENCOUNTER — Telehealth: Payer: Self-pay | Admitting: Cardiology

## 2022-09-23 DIAGNOSIS — E782 Mixed hyperlipidemia: Secondary | ICD-10-CM | POA: Diagnosis not present

## 2022-09-23 DIAGNOSIS — S0181XA Laceration without foreign body of other part of head, initial encounter: Secondary | ICD-10-CM

## 2022-09-23 DIAGNOSIS — I2510 Atherosclerotic heart disease of native coronary artery without angina pectoris: Secondary | ICD-10-CM | POA: Diagnosis not present

## 2022-09-23 DIAGNOSIS — I480 Paroxysmal atrial fibrillation: Secondary | ICD-10-CM | POA: Diagnosis not present

## 2022-09-23 DIAGNOSIS — F02B3 Dementia in other diseases classified elsewhere, moderate, with mood disturbance: Secondary | ICD-10-CM

## 2022-09-23 DIAGNOSIS — G4733 Obstructive sleep apnea (adult) (pediatric): Secondary | ICD-10-CM | POA: Diagnosis not present

## 2022-09-23 DIAGNOSIS — G4700 Insomnia, unspecified: Secondary | ICD-10-CM | POA: Diagnosis not present

## 2022-09-23 DIAGNOSIS — I10 Essential (primary) hypertension: Secondary | ICD-10-CM | POA: Diagnosis not present

## 2022-09-23 DIAGNOSIS — M069 Rheumatoid arthritis, unspecified: Secondary | ICD-10-CM

## 2022-09-23 DIAGNOSIS — M5440 Lumbago with sciatica, unspecified side: Secondary | ICD-10-CM

## 2022-09-23 DIAGNOSIS — R42 Dizziness and giddiness: Secondary | ICD-10-CM

## 2022-09-23 DIAGNOSIS — S060X0A Concussion without loss of consciousness, initial encounter: Secondary | ICD-10-CM

## 2022-09-23 DIAGNOSIS — G308 Other Alzheimer's disease: Secondary | ICD-10-CM | POA: Diagnosis not present

## 2022-09-23 NOTE — Telephone Encounter (Signed)
[facility] in calling to verify patients updated medication list. Needs a verbal

## 2022-09-23 NOTE — Telephone Encounter (Signed)
Call sent straight to triage. Nurse Barbaraann Share at [facility] needs an updated medication list. Informed her that patient has not been seen in almost a  year at our office. Informed her that patient might have had changes since then. Will send medication list from that visit and current medication list to fax number provided.

## 2022-09-23 NOTE — Progress Notes (Addendum)
Location:  Occupational psychologist of Service:  SNF (31) Provider:   Cindi Carbon, ANP [place] [phone]   Plotnikov, Evie Lacks, MD  Patient Care Team: Cassandria Anger, MD as PCP - General Sueanne Margarita, MD as PCP - Cardiology (Cardiology) Thompson Grayer, MD as PCP - Electrophysiology (Cardiology) Irene Shipper, MD (Gastroenterology) Thompson Grayer, MD (Cardiology) Hennie Duos, MD as Consulting Physician (Rheumatology) Alexis Frock, MD as Consulting Physician (Urology) Katy Apo, MD as Consulting Physician (Ophthalmology) Mathis Fare (Dentistry) Tiajuana Amass, MD as Referring Physician (Allergy and Immunology) Cameron Sprang, MD as Consulting Physician (Neurology) Charlton Haws, [facility] as Pharmacist (Pharmacist) Cameron Sprang, MD as Consulting Physician (Neurology)  Extended Emergency Contact Information Primary Emergency Contact: Radke,[name] Address: [place], [place] Phone: [phone] Work Phone: [phone] Mobile Phone: [phone] Relation: Spouse Secondary Emergency Contact: Declo Mobile Phone: [phone] Relation: Daughter  Code Status:  DNR Goals of care: Advanced Directive information    09/23/2022    3:32 PM  Advanced Directives  Does Patient Have a Medical Advance Directive? Yes  Type of Paramedic of [place];Living will;Out of facility DNR (pink MOST or yellow form)  Does patient want to make changes to medical advance directive? No - Patient declined  Copy of [place] in Chart? Yes - validated most recent copy scanned in chart (See row information)  Pre-existing out of facility DNR order (yellow form or pink MOST form) Yellow form placed in chart (order not valid for inpatient use)     Chief Complaint  Patient presents with   Acute Visit    F/u fall with laceration      HPI:  Pt is a 88 y.o. male seen today for follow up after ER visit and admit to memory care at [facility] He was seen in the ER 11/8 due to a mechanical fall. He fell hitting his forehead sustaining a 6 cm laceration. The area was sutured with dissolving sutures. He was diagnosed with a concussion. He did not lose consciousness. Vitals were ok. CT of the head showed no acute bleed. CT of the cervical spine showed some degenerative changes but nothing acute. CXR neg. No labs to review. He has underlying dementia and his wife is his primary caretaker. He came to memory care for additional assistance. Pt is upset to be in memory care. Wants to go home. Does report mild visual changes. No focal deficit.  During his stay he was experiencing some dizziness. Orthostatic bps were obtained.  Orthostatic bp  Lying 80/60 66 Sitting 74/58 74  Standing 110/58 76  There are several discrepancies with his medications when comparing what he takes at home and what is listed in epic. He has some agitation and restlessness reported by his son Samiel. He is listed as being on zyprexa but it is not in his prepared pill packs. MMSE 22/30 with issues of orientation and recall. Also has periodic incontinence. Remains ambulatory with a walker  He is not taking lisinopril, only metoprolol. Has hx of afib on eliquis. Also has hx of CAD with PCI, NSTEMi on asa.  GERD on PPI  Hx of sleep apnea, no longer using cpap  RA on Orencia, follows with Rheumatology HLD on Repatha, zetia, crestor Lab Results  Component Value Date   LDLCALC 106 (H) 08/04/2020   Hx of depression on  lexapro.   Has order for valtrex not in pill packs. No current hx of herpetic lesions.   Has prn ambien ordered but is not using Has chronic arthritic back pain, prn percocet ordered  Past Medical History:  Diagnosis Date   CAD (coronary artery disease)    a. s/p inferior STEMI 01/08/2014; LHC 01/08/14: total RCA occlusion s/p 3.5x79m Xience  DES distal RCA and 3.5x28 mm DES mid RCA, 60-70% mid LAD stenosis, EF 55%.   Complication of anesthesia    'long to wake up after back surgery " 09/2003   Diverticulosis of colon    GERD (gastroesophageal reflux disease)    History of cellulitis    05-26-2015  LLE   History of colon polyps    1998- benign/  2008 adenomatous    History of kidney stones    2013   History of squamous cell carcinoma excision    2013;  2015;  06-12-2015 right leg/  02/ and 05/ 2017  left ear and left leg   Hydronephrosis, left    Hyperlipidemia    Hypertension    Migraine with aura    OSA on CPAP 06/19/2015   Moderate OSA with AHI 18/hr  per study 05-20-2015   Osteoarthritis    [place] 4[place]   chads2vasc score is at least 4   Premature atrial contractions    RA (rheumatoid arthritis) ([facility]    rheumatologist-  dr jLeigh [place]  Sinusitis, chronic 10/17/2015   Wears glasses    Wears hearing aid    bilateral   Past Surgical History:  Procedure Laterality Date   BACK SURGERY     CARDIOVASCULAR STRESS TEST  11/21/2015   Low risk nuclear study w/ a small diaphragmatic attenuation artifact, no ischemia/  normal LV function and wall motion , ef 63%   CATARACT EXTRACTION W/ INTRAOCULAR LENS  IMPLANT, BILATERAL  2015   COLONOSCOPY  last one 06-09-2011   CORONARY ANGIOPLASTY     CYSTOSCOPY W/ RETROGRADES Left 07/12/2016   Procedure: CYSTOSCOPY WITH RETROGRADE PYELOGRAM LEFT URETERAL STENT;  Surgeon: JIrine Seal MD;  Location: [facility];  Service: Urology;  Laterality: Left;   CYSTOSCOPY WITH RETROGRADE PYELOGRAM, URETEROSCOPY AND STENT PLACEMENT Left 08/11/2016   Procedure: CYSTOSCOPY WITH RETROGRADE PYELOGRAM,  DIAGNOSTIC URETEROSCOPY , STENT EXCHANGE;  Surgeon: TAlexis Frock MD;  [facility]  Service: Urology;  Laterality: Left;   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   severe fibromatosis palm and fingers   LEFT HEART CATHETERIZATION WITH  CORONARY ANGIOGRAM N/A 01/08/2014   Procedure: LEFT HEART CATHETERIZATION WITH CORONARY ANGIOGRAM;  Surgeon: TTroy Sine MD;  [facility];  Service: Cardiovascular;  Laterality:N/A;  total/ subtotal RCA/  mLAD 676-73%w/ mid systolic bridging/  preserved global LVF, ef 55%   MOHS SURGERY  x2  feb and may 2017   left ear /  left leg  ([facility])   PERCUTANEOUS CORONARY STENT INTERVENTION (PCI-S)  01/08/2014   Procedure: PERCUTANEOUS CORONARY STENT INTERVENTION (PCI-S);  Surgeon: TTroy Sine MD;  [facility];  Service: Cardiovascular;;  DES to mid and distal RCA   POSTERIOR LUMBAR FUSION  10/01/2003   and Laminectomy/ diskectomy  L4 -- S1   ROBOT ASSISTED PYELOPLASTY Left 09/15/2016   Procedure: XI ROBOTIC ASSISTED PYELOPLASTY;  Surgeon: TAlexis Frock MD;  Location: [facility];  Service: Urology;  Laterality: Left;   TONSILLECTOMY     TRANSTHORACIC ECHOCARDIOGRAM  02/23/2016   mild LVH,  ef 50-55%,  grade 1 diastolic dysfunction/  mild to moderate AV calcification w/ no stenosis or regurg./  trivial MR and TR/  mild PR    Allergies  Allergen Reactions   Methotrexate Other (See Comments)    REACTION: tachycardia   Aricept [Donepezil Hcl]     nightmares    Crestor [Rosuvastatin Calcium]     Myalgias with '20mg'$  dose   Lisinopril Other (See Comments)    cough   Namenda [Memantine]     n/d   Atorvastatin Other (See Comments)    Muscle pain, leg cramps   Pravastatin Sodium Other (See Comments)    REACTION: cramps, fatigue    Outpatient Encounter Medications as of 09/23/2022  Medication Sig   abatacept (ORENCIA) 250 MG injection every 30 (thirty) days.   acetaminophen (TYLENOL) 500 MG tablet Take 1,000 mg by mouth every 6 (six) hours as needed.   aspirin EC 81 MG tablet Take 81 mg by mouth daily. (Patient not taking: Reported on 09/23/2022)   ELIQUIS 5 MG TABS tablet TAKE ONE TABLET BY MOUTH EVERY MORNING and TAKE ONE TABLET BY MOUTH EVERYDAY AT BEDTIME   escitalopram  (LEXAPRO) 20 MG tablet TAKE ONE TABLET BY MOUTH ONCE DAILY   Evolocumab (REPATHA SURECLICK) 329 MG/ML SOAJ INJECT 1 PEN INTO SKIN EVERY 14 DAYS   ezetimibe (ZETIA) 10 MG tablet TAKE 1/2 TABLET BY MOUTH ONCE DAILY   LORazepam (ATIVAN) 0.5 MG tablet Take 1 tablet (0.5 mg total) by mouth 2 (two) times daily as needed for up to 14 days for anxiety.   metoprolol tartrate (LOPRESSOR) 25 MG tablet TAKE ONE TABLET BY MOUTH EVERYDAY AT BEDTIME   nitroGLYCERIN (NITROSTAT) 0.4 MG SL tablet ONE TABLET UNDER TONGUE AS NEEDED FOR CHEST PAIN. MAY REPEAT IN 5 MINUTES AND AGAIN IN 5 MINUTES (TOTAL OF 3 TABLETS)   omeprazole (PRILOSEC) 40 MG capsule TAKE ONE CAPSULE BY MOUTH ONCE DAILY   rosuvastatin (CRESTOR) 5 MG tablet TAKE ONE TABLET BY MOUTH ONCE DAILY   [DISCONTINUED] acidophilus (RISAQUAD) CAPS capsule Take 1 capsule by mouth daily.   [DISCONTINUED] donepezil (ARICEPT) 5 MG tablet TAKE ONE TABLET BY MOUTH EVERY MORNING   [DISCONTINUED] lisinopril (ZESTRIL) 10 MG tablet Take 1 tablet (10 mg total) by mouth daily.   [DISCONTINUED] memantine (NAMENDA) 10 MG tablet Take 1 tablet (10 mg total) by mouth daily.   [DISCONTINUED] OLANZapine (ZYPREXA) 2.5 MG tablet Take 1 tablet (2.5 mg total) by mouth at bedtime.   [DISCONTINUED] oxyCODONE-acetaminophen (PERCOCET/ROXICET) 5-325 MG tablet TAKE ONE TABLET EVERY EIGHT HOURS AS NEEDED FOR SEVERE PAIN   [DISCONTINUED] Propylene Glycol (SYSTANE BALANCE OP) Place 2 drops into both eyes daily as needed (dry eyes).    [DISCONTINUED] valACYclovir (VALTREX) 500 MG tablet Take 1 tablet (500 mg total) by mouth 2 (two) times daily.   [DISCONTINUED] zolpidem (AMBIEN) 10 MG tablet take 1/2 to 1 tablet at bedtime as needed for sleep   No facility-administered encounter medications on file as of 09/23/2022.    Review of Systems  Constitutional:  Negative for activity change, appetite change, chills, diaphoresis, fatigue, fever and unexpected weight change.  HENT:  Negative for  congestion.   Eyes:  Negative for photophobia, pain, discharge, redness, itching and visual disturbance.  Respiratory:  Negative for cough, shortness of breath, wheezing and stridor.   Cardiovascular:  Negative for chest pain, palpitations and leg swelling.  Gastrointestinal:  Negative for abdominal distention, abdominal pain, constipation and diarrhea.  Genitourinary:  Negative for difficulty urinating  and dysuria.  Musculoskeletal:  Negative for arthralgias, back pain, gait problem, joint swelling and myalgias.  Skin:  Positive for rash.  Neurological:  Positive for dizziness. Negative for seizures, syncope, facial asymmetry, speech difficulty, weakness and headaches.  Hematological:  Negative for adenopathy. Does not bruise/bleed easily.  Psychiatric/Behavioral:  Positive for agitation and confusion. Negative for behavioral problems.     Immunization History  Administered Date(s) Administered   Fluad Quad(high Dose 65+) 07/09/2019, 08/15/2020, 09/16/2021, 07/29/2022   Influenza Split 08/10/2011, 08/15/2012   Influenza Whole 08/21/2009, 09/22/2010   Influenza, High Dose Seasonal PF 09/16/2015, 08/18/2016, 08/31/2017, 08/14/2018   Influenza,inj,Quad PF,6+ Mos 08/06/2013   Influenza-Unspecified 08/31/2017   PFIZER(Purple Top)SARS-COV-2 Vaccination 12/11/2019, 01/01/2020, 08/12/2020   Pneumococcal Conjugate-13 10/02/2013   Pneumococcal Polysaccharide-23 06/04/2010   Tdap 10/22/2011, 09/22/2022   Zoster Recombinat (Shingrix) 03/09/2017, 05/10/2017   Pertinent  Health Maintenance Due  Topic Date Due   INFLUENZA VACCINE  Completed      11/10/2021   10:31 AM 04/05/2022   10:51 AM 06/25/2022    9:45 AM 09/22/2022    3:31 AM 09/23/2022    3:45 PM  Fall Risk  Falls in the past year?  0 0  1  Was there an injury with Fall?  0 0  1  Fall Risk Category Calculator  0 0  3  Fall Risk Category  Low Low  High  Patient Fall Risk Level Low fall risk Low fall risk Low fall risk High fall risk  High fall risk  Patient at Risk for Falls Due to     History of fall(s);Impaired balance/gait  Fall risk Follow up   Falls evaluation completed;Education provided     Functional Status Survey:    Vitals:   09/23/22 1528  BP: (!) 150/82  Pulse: 80  Resp: 20  Temp: (!) 97.2 F (36.2 C)  SpO2: 96%  Weight: 188 lb 12.8 oz (85.6 kg)   Body mass index is 25.61 kg/m. Physical Exam Vitals reviewed.  Constitutional:      General: He is not in acute distress.    Appearance: He is not diaphoretic.  HENT:     Head:     Comments: Forehead with laceration well approximated no bleeding or drainage.     Nose: Nose normal.     Mouth/Throat:     Mouth: Mucous membranes are moist.     Pharynx: Oropharynx is clear.  Eyes:     General:        Right eye: No discharge.        Left eye: No discharge.     Extraocular Movements: Extraocular movements intact.     Conjunctiva/sclera: Conjunctivae normal.     Pupils: Pupils are equal, round, and reactive to light.  Neck:     Thyroid: No thyromegaly.     Vascular: No JVD.     Trachea: No tracheal deviation.  Cardiovascular:     Rate and Rhythm: Normal rate and regular rhythm.     Heart sounds: No murmur heard. Pulmonary:     Effort: Pulmonary effort is normal. No respiratory distress.     Breath sounds: Normal breath sounds. No wheezing.  Abdominal:     General: Bowel sounds are normal. There is no distension.     Palpations: Abdomen is soft.     Tenderness: There is no abdominal tenderness.  Lymphadenopathy:     Cervical: No cervical adenopathy.  Skin:    General: Skin is warm and dry.  Neurological:  General: No focal deficit present.     Mental Status: He is alert and oriented to person, place, and time. Mental status is at baseline.     Cranial Nerves: No cranial nerve deficit.     Gait: Gait normal.  Psychiatric:        Mood and Affect: Mood normal.     Labs reviewed: Recent Labs    07/29/22 1345  NA 139  K 4.1  CL  105  CO2 27  GLUCOSE 90  BUN 16  CREATININE 0.91  CALCIUM 9.5   Recent Labs    07/29/22 1345  AST 17  ALT 13  ALKPHOS 58  BILITOT 0.5  PROT 7.7  ALBUMIN 4.1   Recent Labs    07/29/22 1345  WBC 5.3  NEUTROABS 2.7  HGB 12.8*  HCT 38.2*  MCV 99.3  PLT 274.0   Lab Results  Component Value Date   TSH 1.18 12/13/2017   Lab Results  Component Value Date   HGBA1C 6.4 08/30/2017   Lab Results  Component Value Date   CHOL 197 08/04/2020   HDL 47 08/04/2020   LDLCALC 106 (H) 08/04/2020   LDLDIRECT 168.9 05/24/2013   TRIG 254 (H) 08/04/2020   CHOLHDL 4.2 08/04/2020    Significant Diagnostic Results in last 30 days:  DG Chest 2 View  Result Date: 09/22/2022 CLINICAL DATA:  Chest and rib pain after a fall. EXAM: CHEST - 2 VIEW COMPARISON:  02/15/2021 FINDINGS: 0509 hours. Low volume film. Cardiopericardial silhouette is at upper limits of normal for size. The lungs are clear without focal pneumonia, edema, pneumothorax or pleural effusion. The visualized bony structures of the thorax are unremarkable. Telemetry leads overlie the chest. IMPRESSION: Low volume film without acute cardiopulmonary findings. Electronically Signed   By: Misty Stanley M.D.   On: 09/22/2022 05:41   CT Cervical Spine Wo Contrast  Result Date: 09/22/2022 CLINICAL DATA:  88 year old male status post fall while going to the bathroom. Prior cervical fusion. EXAM: CT CERVICAL SPINE WITHOUT CONTRAST TECHNIQUE: Multidetector CT imaging of the cervical spine was performed without intravenous contrast. Multiplanar CT image reconstructions were also generated. RADIATION DOSE REDUCTION: This exam was performed according to the departmental dose-optimization program which includes automated exposure control, adjustment of the mA and/or kV according to patient size and/or use of iterative reconstruction technique. COMPARISON:  Cervical spine MRI 07/04/2019. Preoperative cervical spine radiographs 04/04/2017. FINDINGS:  Alignment: Chronic straightening and mild reversal of cervical lordosis is stable. Mild chronic anterolisthesis of C7 on T1 is stable. Bilateral posterior element alignment is within normal limits. Skull base and vertebrae: Visualized skull base is intact. No atlanto-occipital dissociation. C1 and C2 appear intact and aligned, with a degenerative subchondral cyst at the left base of the odontoid which is new since 2020. Postoperative details are below. No acute osseous abnormality identified. Soft tissues and spinal canal: No prevertebral fluid or swelling. No visible canal hematoma. Negative noncontrast neck soft tissues aside from calcified carotid atherosclerosis. Disc levels: Chronic C4-C5 and C5-C6 ACDF with solid arthrodesis and no hardware loosening. Acquired adjacent segment ankylosis at C3-C4, and also C6-C7. Superimposed degenerative spondylolisthesis at both the C2-C3 and C7-T1 segments which are adjacent to those. Facet arthropathy at both levels. However, capacious spinal canal is demonstrated by MRI in 2020. Doubt spinal stenosis. Upper chest: Mild chronic T1 and T2 endplate deformities were present on the 2020 MRI. Other visible upper thoracic levels appear grossly intact. Calcified aortic atherosclerosis. Negative lung apices. IMPRESSION: 1.  No acute traumatic injury identified in the cervical spine. 2. Chronic C4 through C6 ACDF with solid arthrodesis, and additional acquired ankylosis of both adjacent segments at C3-C4 and C6-C7. Subsequent C2-C3 and C7-T1 degeneration including spondylolisthesis and facet arthropathy. 3.  Aortic Atherosclerosis (ICD10-I70.0). Electronically Signed   By: Genevie Ann M.D.   On: 09/22/2022 04:19   CT Head Wo Contrast  Result Date: 09/22/2022 CLINICAL DATA:  88 year old male status post fall while going to the bathroom. EXAM: CT HEAD WITHOUT CONTRAST TECHNIQUE: Contiguous axial images were obtained from the base of the skull through the vertex without intravenous  contrast. RADIATION DOSE REDUCTION: This exam was performed according to the departmental dose-optimization program which includes automated exposure control, adjustment of the mA and/or kV according to patient size and/or use of iterative reconstruction technique. COMPARISON:  Cervical spine CT today.  Head CT 11/10/2021. FINDINGS: Brain: Stable cerebral volume since last year. Ex vacuo appearing ventricular enlargement with confluent bilateral cerebral white matter hypodensity superimposed. Chronic mixed density but predominantly low-density mass at the central skull base is intra sellar with suprasellar extension to the level of the optic chiasm. This is most apparent on sagittal image 32, up to 17 mm long axis, and unchanged since 04/10/2019. No other intracranial mass identified. No midline shift, mass effect, intracranial hemorrhage or evidence of cortically based acute infarction. Stable gray-white matter differentiation throughout the brain. Vascular: Calcified atherosclerosis at the skull base. No suspicious intracranial vascular hyperdensity. Skull: Stable and intact. Sinuses/Orbits: Visualized paranasal sinuses and mastoids are stable and well aerated. Other: Forehead scalp soft tissue laceration and deficiency with mild regional hematoma and/or contusion affecting the forehead to the right of midline. Underlying frontal sinuses and frontal bones appear intact. Orbits soft tissues remain within normal limits. IMPRESSION: 1. Forehead scalp soft tissue injury without underlying skull fracture. 2. No acute intracranial abnormality. Chronic cerebral white matter disease. And chronic midline intra-sellar/suprasellar mass which abuts the optic chiasm, but has not significantly changed by CT since 04/10/2019. Favor a chronic Pituitary Macroadenoma. Electronically Signed   By: Genevie Ann M.D.   On: 09/22/2022 04:15    Assessment/Plan  1. Laceration of forehead, initial encounter Wound assessed approximated, no  current issues.   2. Dizziness Due to recent fall with concussion and low bp Check labs D/C lisinopril   3. Concussion without loss of consciousness, initial encounter Continue neuro checks   4. Paroxysmal atrial fibrillation (HCC) Rate is regular and controlled Hold metoprolol due to low bp if SBP <100   5. Primary hypertension BP running low D/c lisinopril   6. Coronary artery disease involving native coronary artery of native heart without angina pectoris Hx of MI On statin, asa, eliquis.  Bp running low  7. OSA (obstructive sleep apnea) No longer using cpap due to compliance.   8. Moderate Alzheimer's dementia of other onset with mood disturbance ([place]) Ativan is ordered to help with agitation here Appears moderate stage Will likely go home with home care and his wife's support.  Zyprexa is on the med list but its not in his pill packs prepared for him from home. Will need to clarify with his family   38. Rheumatoid arthritis involving elbow, unspecified laterality, unspecified whether rheumatoid factor present ([place]) Followed by Rheumatology   10. Mixed hyperlipidemia Continue statin, repatha, zetia Risk with hx of afib and MI  11. Low back pain with sciatica, sciatica laterality unspecified, unspecified back pain laterality, unspecified chronicity Not currently taking percocet D/C percocet due  to current ativan use and fall risk  12. Insomnia, unspecified type D/C ambien at this time due to falls and taking ativan.   13. Rash Triamcinolone 0.1 % bid x 7 days  Family/ staff Communication: spoke with son Zade Falkner. He would like for the patient to switch to [facility] He would like to focus on providing comfort for his dad rather than trying to heal his dementia as he knows it will progress. He had DNR signed today. They are thinking he will go home with care givers to help support is mom understanding that he will progress to memory care at some point.   Labs/tests  ordered:  CBC BMP IN am    Addendum 09/24/22 Spoke with his wife [name]. She confirmed that he was taking Aricept at home. Will resume. She said he used zyprexa prn. Now that he is in memory care will resume a dose each night scheduled.

## 2022-09-24 DIAGNOSIS — Z79899 Other long term (current) drug therapy: Secondary | ICD-10-CM | POA: Diagnosis not present

## 2022-09-24 LAB — CBC: RBC: 3.83 — AB (ref 3.87–5.11)

## 2022-09-24 LAB — BASIC METABOLIC PANEL
BUN: 12 (ref 4–21)
CO2: 24 — AB (ref 13–22)
Chloride: 104 (ref 99–108)
Creatinine: 0.9 (ref 0.6–1.3)
Glucose: 97
Potassium: 3.8 mEq/L (ref 3.5–5.1)
Sodium: 141 (ref 137–147)

## 2022-09-24 LAB — CBC AND DIFFERENTIAL
HCT: 38 — AB (ref 41–53)
Hemoglobin: 12.9 — AB (ref 13.5–17.5)
Platelets: 294 10*3/uL (ref 150–400)
WBC: 5.3

## 2022-09-24 LAB — COMPREHENSIVE METABOLIC PANEL
Calcium: 9.2 (ref 8.7–10.7)
eGFR: 84

## 2022-10-02 ENCOUNTER — Inpatient Hospital Stay (HOSPITAL_COMMUNITY): Payer: Medicare Other

## 2022-10-02 ENCOUNTER — Other Ambulatory Visit: Payer: Self-pay

## 2022-10-02 ENCOUNTER — Emergency Department (HOSPITAL_COMMUNITY): Payer: Medicare Other

## 2022-10-02 ENCOUNTER — Other Ambulatory Visit (HOSPITAL_COMMUNITY): Payer: Medicare Other

## 2022-10-02 ENCOUNTER — Inpatient Hospital Stay (HOSPITAL_COMMUNITY)
Admission: EM | Admit: 2022-10-02 | Discharge: 2022-10-05 | DRG: 086 | Disposition: A | Discharge status: Skilled Nursing Facility | Payer: Medicare Other | Source: Skilled Nursing Facility | Attending: General Surgery | Admitting: General Surgery

## 2022-10-02 DIAGNOSIS — G308 Other Alzheimer's disease: Secondary | ICD-10-CM | POA: Diagnosis not present

## 2022-10-02 DIAGNOSIS — M1990 Unspecified osteoarthritis, unspecified site: Secondary | ICD-10-CM | POA: Diagnosis not present

## 2022-10-02 DIAGNOSIS — Z888 Allergy status to other drugs, medicaments and biological substances status: Secondary | ICD-10-CM

## 2022-10-02 DIAGNOSIS — R918 Other nonspecific abnormal finding of lung field: Secondary | ICD-10-CM | POA: Diagnosis not present

## 2022-10-02 DIAGNOSIS — I2510 Atherosclerotic heart disease of native coronary artery without angina pectoris: Secondary | ICD-10-CM | POA: Diagnosis present

## 2022-10-02 DIAGNOSIS — Z7901 Long term (current) use of anticoagulants: Secondary | ICD-10-CM

## 2022-10-02 DIAGNOSIS — R402142 Coma scale, eyes open, spontaneous, at arrival to emergency department: Secondary | ICD-10-CM | POA: Diagnosis not present

## 2022-10-02 DIAGNOSIS — I771 Stricture of artery: Secondary | ICD-10-CM | POA: Diagnosis not present

## 2022-10-02 DIAGNOSIS — I10 Essential (primary) hypertension: Secondary | ICD-10-CM | POA: Diagnosis present

## 2022-10-02 DIAGNOSIS — Z8249 Family history of ischemic heart disease and other diseases of the circulatory system: Secondary | ICD-10-CM | POA: Diagnosis not present

## 2022-10-02 DIAGNOSIS — Z974 Presence of external hearing-aid: Secondary | ICD-10-CM

## 2022-10-02 DIAGNOSIS — I358 Other nonrheumatic aortic valve disorders: Secondary | ICD-10-CM | POA: Diagnosis not present

## 2022-10-02 DIAGNOSIS — R402362 Coma scale, best motor response, obeys commands, at arrival to emergency department: Secondary | ICD-10-CM | POA: Diagnosis not present

## 2022-10-02 DIAGNOSIS — Z79899 Other long term (current) drug therapy: Secondary | ICD-10-CM

## 2022-10-02 DIAGNOSIS — I6782 Cerebral ischemia: Secondary | ICD-10-CM | POA: Diagnosis not present

## 2022-10-02 DIAGNOSIS — Z743 Need for continuous supervision: Secondary | ICD-10-CM | POA: Diagnosis not present

## 2022-10-02 DIAGNOSIS — I252 Old myocardial infarction: Secondary | ICD-10-CM | POA: Diagnosis not present

## 2022-10-02 DIAGNOSIS — W010XXA Fall on same level from slipping, tripping and stumbling without subsequent striking against object, initial encounter: Secondary | ICD-10-CM | POA: Diagnosis present

## 2022-10-02 DIAGNOSIS — I6200 Nontraumatic subdural hemorrhage, unspecified: Secondary | ICD-10-CM | POA: Diagnosis not present

## 2022-10-02 DIAGNOSIS — S0012XA Contusion of left eyelid and periocular area, initial encounter: Secondary | ICD-10-CM | POA: Diagnosis present

## 2022-10-02 DIAGNOSIS — K5730 Diverticulosis of large intestine without perforation or abscess without bleeding: Secondary | ICD-10-CM | POA: Diagnosis not present

## 2022-10-02 DIAGNOSIS — Z7401 Bed confinement status: Secondary | ICD-10-CM | POA: Diagnosis not present

## 2022-10-02 DIAGNOSIS — M549 Dorsalgia, unspecified: Secondary | ICD-10-CM | POA: Diagnosis not present

## 2022-10-02 DIAGNOSIS — T699XXA Effect of reduced temperature, unspecified, initial encounter: Secondary | ICD-10-CM | POA: Diagnosis not present

## 2022-10-02 DIAGNOSIS — S065XAA Traumatic subdural hemorrhage with loss of consciousness status unknown, initial encounter: Secondary | ICD-10-CM | POA: Diagnosis not present

## 2022-10-02 DIAGNOSIS — E785 Hyperlipidemia, unspecified: Secondary | ICD-10-CM | POA: Diagnosis present

## 2022-10-02 DIAGNOSIS — S2242XA Multiple fractures of ribs, left side, initial encounter for closed fracture: Secondary | ICD-10-CM | POA: Diagnosis present

## 2022-10-02 DIAGNOSIS — K219 Gastro-esophageal reflux disease without esophagitis: Secondary | ICD-10-CM | POA: Diagnosis present

## 2022-10-02 DIAGNOSIS — N2889 Other specified disorders of kidney and ureter: Secondary | ICD-10-CM | POA: Diagnosis not present

## 2022-10-02 DIAGNOSIS — R220 Localized swelling, mass and lump, head: Secondary | ICD-10-CM | POA: Diagnosis not present

## 2022-10-02 DIAGNOSIS — S065X0A Traumatic subdural hemorrhage without loss of consciousness, initial encounter: Secondary | ICD-10-CM | POA: Diagnosis not present

## 2022-10-02 DIAGNOSIS — G319 Degenerative disease of nervous system, unspecified: Secondary | ICD-10-CM | POA: Diagnosis not present

## 2022-10-02 DIAGNOSIS — Z87891 Personal history of nicotine dependence: Secondary | ICD-10-CM

## 2022-10-02 DIAGNOSIS — R402252 Coma scale, best verbal response, oriented, at arrival to emergency department: Secondary | ICD-10-CM | POA: Diagnosis not present

## 2022-10-02 DIAGNOSIS — S59902A Unspecified injury of left elbow, initial encounter: Secondary | ICD-10-CM | POA: Diagnosis not present

## 2022-10-02 DIAGNOSIS — I480 Paroxysmal atrial fibrillation: Secondary | ICD-10-CM | POA: Diagnosis present

## 2022-10-02 DIAGNOSIS — W19XXXA Unspecified fall, initial encounter: Secondary | ICD-10-CM

## 2022-10-02 DIAGNOSIS — R6889 Other general symptoms and signs: Secondary | ICD-10-CM | POA: Diagnosis not present

## 2022-10-02 DIAGNOSIS — J9811 Atelectasis: Secondary | ICD-10-CM | POA: Diagnosis not present

## 2022-10-02 DIAGNOSIS — S0083XA Contusion of other part of head, initial encounter: Secondary | ICD-10-CM | POA: Diagnosis present

## 2022-10-02 DIAGNOSIS — S3993XA Unspecified injury of pelvis, initial encounter: Secondary | ICD-10-CM | POA: Diagnosis not present

## 2022-10-02 DIAGNOSIS — G4733 Obstructive sleep apnea (adult) (pediatric): Secondary | ICD-10-CM | POA: Diagnosis not present

## 2022-10-02 DIAGNOSIS — S3991XA Unspecified injury of abdomen, initial encounter: Secondary | ICD-10-CM | POA: Diagnosis not present

## 2022-10-02 DIAGNOSIS — S0990XA Unspecified injury of head, initial encounter: Secondary | ICD-10-CM | POA: Diagnosis not present

## 2022-10-02 DIAGNOSIS — S5002XA Contusion of left elbow, initial encounter: Secondary | ICD-10-CM | POA: Diagnosis present

## 2022-10-02 DIAGNOSIS — S0181XD Laceration without foreign body of other part of head, subsequent encounter: Secondary | ICD-10-CM | POA: Diagnosis not present

## 2022-10-02 DIAGNOSIS — M069 Rheumatoid arthritis, unspecified: Secondary | ICD-10-CM | POA: Diagnosis present

## 2022-10-02 DIAGNOSIS — R296 Repeated falls: Secondary | ICD-10-CM | POA: Diagnosis present

## 2022-10-02 DIAGNOSIS — Z955 Presence of coronary angioplasty implant and graft: Secondary | ICD-10-CM

## 2022-10-02 DIAGNOSIS — S199XXA Unspecified injury of neck, initial encounter: Secondary | ICD-10-CM | POA: Diagnosis not present

## 2022-10-02 DIAGNOSIS — E782 Mixed hyperlipidemia: Secondary | ICD-10-CM | POA: Diagnosis not present

## 2022-10-02 DIAGNOSIS — R531 Weakness: Secondary | ICD-10-CM | POA: Diagnosis not present

## 2022-10-02 DIAGNOSIS — F02B3 Dementia in other diseases classified elsewhere, moderate, with mood disturbance: Secondary | ICD-10-CM | POA: Diagnosis not present

## 2022-10-02 LAB — URINALYSIS, ROUTINE W REFLEX MICROSCOPIC
Bilirubin Urine: NEGATIVE
Glucose, UA: NEGATIVE mg/dL
Ketones, ur: NEGATIVE mg/dL
Leukocytes,Ua: NEGATIVE
Nitrite: NEGATIVE
Protein, ur: NEGATIVE mg/dL
Specific Gravity, Urine: 1.016 (ref 1.005–1.030)
pH: 5 (ref 5.0–8.0)

## 2022-10-02 LAB — I-STAT CHEM 8, ED
BUN: 14 mg/dL (ref 8–23)
Calcium, Ion: 1.19 mmol/L (ref 1.15–1.40)
Chloride: 106 mmol/L (ref 98–111)
Creatinine, Ser: 1 mg/dL (ref 0.61–1.24)
Glucose, Bld: 108 mg/dL — ABNORMAL HIGH (ref 70–99)
HCT: 37 % — ABNORMAL LOW (ref 39.0–52.0)
Hemoglobin: 12.6 g/dL — ABNORMAL LOW (ref 13.0–17.0)
Potassium: 4.2 mmol/L (ref 3.5–5.1)
Sodium: 142 mmol/L (ref 135–145)
TCO2: 25 mmol/L (ref 22–32)

## 2022-10-02 LAB — COMPREHENSIVE METABOLIC PANEL
ALT: 16 U/L (ref 0–44)
AST: 24 U/L (ref 15–41)
Albumin: 4 g/dL (ref 3.5–5.0)
Alkaline Phosphatase: 61 U/L (ref 38–126)
Anion gap: 11 (ref 5–15)
BUN: 13 mg/dL (ref 8–23)
CO2: 23 mmol/L (ref 22–32)
Calcium: 9.1 mg/dL (ref 8.9–10.3)
Chloride: 106 mmol/L (ref 98–111)
Creatinine, Ser: 0.94 mg/dL (ref 0.61–1.24)
GFR, Estimated: >60 mL/min (ref >60)
Glucose, Bld: 116 mg/dL — ABNORMAL HIGH (ref 70–99)
Potassium: 4.1 mmol/L (ref 3.5–5.1)
Sodium: 140 mmol/L (ref 135–145)
Total Bilirubin: 0.6 mg/dL (ref 0.3–1.2)
Total Protein: 7 g/dL (ref 6.5–8.1)

## 2022-10-02 LAB — SAMPLE TO BLOOD BANK

## 2022-10-02 LAB — LACTIC ACID, PLASMA: Lactic Acid, Venous: 1.9 mmol/L (ref 0.5–1.9)

## 2022-10-02 LAB — ETHANOL: Alcohol, Ethyl (B): <10 mg/dL (ref <10)

## 2022-10-02 LAB — PROTIME-INR
INR: 1.1 (ref 0.8–1.2)
Prothrombin Time: 14.1 seconds (ref 11.4–15.2)

## 2022-10-02 LAB — CBC
HCT: 37.5 % — ABNORMAL LOW (ref 39.0–52.0)
Hemoglobin: 12.4 g/dL — ABNORMAL LOW (ref 13.0–17.0)
MCH: 32.8 pg (ref 26.0–34.0)
MCHC: 33.1 g/dL (ref 30.0–36.0)
MCV: 99.2 fL (ref 80.0–100.0)
Platelets: 333 10*3/uL (ref 150–400)
RBC: 3.78 MIL/uL — ABNORMAL LOW (ref 4.22–5.81)
RDW: 13.6 % (ref 11.5–15.5)
WBC: 6.3 10*3/uL (ref 4.0–10.5)
nRBC: 0 % (ref 0.0–0.2)

## 2022-10-02 MED ORDER — DOCUSATE SODIUM 100 MG PO CAPS
100.0000 mg | ORAL_CAPSULE | Freq: Two times a day (BID) | ORAL | Status: DC
  Administered 2022-10-02 – 2022-10-05 (×7): 100 mg via ORAL
  Filled 2022-10-02 (×7): qty 1

## 2022-10-02 MED ORDER — IOHEXOL 350 MG/ML SOLN
75.0000 mL | Freq: Once | INTRAVENOUS | Status: AC | PRN
  Administered 2022-10-02: 75 mL via INTRAVENOUS

## 2022-10-02 MED ORDER — PROCHLORPERAZINE EDISYLATE 10 MG/2ML IJ SOLN: 10.0000 mg | INTRAMUSCULAR | Status: DC | PRN

## 2022-10-02 MED ORDER — OLANZAPINE 5 MG PO TABS
2.5000 mg | ORAL_TABLET | Freq: Every day | ORAL | Status: DC
  Administered 2022-10-02 – 2022-10-04 (×3): 2.5 mg via ORAL
  Filled 2022-10-02 (×3): qty 1

## 2022-10-02 MED ORDER — OXYCODONE HCL 5 MG PO TABS
5.0000 mg | ORAL_TABLET | ORAL | Status: DC | PRN
  Administered 2022-10-02 – 2022-10-05 (×5): 5 mg via ORAL
  Filled 2022-10-02 (×5): qty 1

## 2022-10-02 MED ORDER — SIMETHICONE 80 MG PO CHEW: 80.0000 mg | CHEWABLE_TABLET | Freq: Four times a day (QID) | ORAL | Status: DC | PRN

## 2022-10-02 MED ORDER — OXYCODONE HCL 5 MG PO TABS
10.0000 mg | ORAL_TABLET | ORAL | Status: DC | PRN
  Administered 2022-10-02 – 2022-10-03 (×3): 10 mg via ORAL
  Filled 2022-10-02 (×3): qty 2

## 2022-10-02 MED ORDER — ACETAMINOPHEN 325 MG PO TABS
650.0000 mg | ORAL_TABLET | Freq: Four times a day (QID) | ORAL | Status: DC
  Administered 2022-10-02 – 2022-10-05 (×9): 650 mg via ORAL
  Filled 2022-10-02 (×8): qty 2

## 2022-10-02 MED ORDER — FENTANYL CITRATE PF 50 MCG/ML IJ SOSY
50.0000 ug | PREFILLED_SYRINGE | Freq: Once | INTRAMUSCULAR | Status: AC
  Administered 2022-10-02: 50 ug via INTRAVENOUS
  Filled 2022-10-02: qty 1

## 2022-10-02 MED ORDER — PANTOPRAZOLE SODIUM 40 MG PO TBEC
40.0000 mg | DELAYED_RELEASE_TABLET | Freq: Every day | ORAL | Status: DC
  Administered 2022-10-02 – 2022-10-05 (×4): 40 mg via ORAL
  Filled 2022-10-02 (×4): qty 1

## 2022-10-02 MED ORDER — ZOLPIDEM TARTRATE 5 MG PO TABS: 5.0000 mg | ORAL_TABLET | Freq: Every evening | ORAL | Status: DC | PRN

## 2022-10-02 MED ORDER — HYDROMORPHONE HCL 1 MG/ML IJ SOLN: 0.5000 mg | INTRAMUSCULAR | Status: DC | PRN

## 2022-10-02 MED ORDER — EMPTY CONTAINERS FLEXIBLE MISC
900.0000 mg | Freq: Once | Status: AC
  Administered 2022-10-02: 900 mg via INTRAVENOUS
  Filled 2022-10-02: qty 60

## 2022-10-02 MED ORDER — MEMANTINE HCL 10 MG PO TABS
10.0000 mg | ORAL_TABLET | Freq: Every day | ORAL | Status: DC
  Administered 2022-10-02 – 2022-10-04 (×3): 10 mg via ORAL
  Filled 2022-10-02 (×4): qty 1

## 2022-10-02 MED ORDER — LORAZEPAM 0.5 MG PO TABS
0.5000 mg | ORAL_TABLET | Freq: Two times a day (BID) | ORAL | Status: DC | PRN
  Administered 2022-10-02 – 2022-10-04 (×4): 0.5 mg via ORAL
  Filled 2022-10-02 (×5): qty 1

## 2022-10-02 MED ORDER — METHOCARBAMOL 1000 MG/10ML IJ SOLN: 500.0000 mg | Freq: Four times a day (QID) | INTRAVENOUS | Status: DC | PRN

## 2022-10-02 MED ORDER — ONDANSETRON HCL 4 MG/2ML IJ SOLN: 4.0000 mg | Freq: Four times a day (QID) | INTRAMUSCULAR | Status: DC | PRN

## 2022-10-02 MED ORDER — ESCITALOPRAM OXALATE 10 MG PO TABS
20.0000 mg | ORAL_TABLET | Freq: Every day | ORAL | Status: DC
  Administered 2022-10-02 – 2022-10-05 (×4): 20 mg via ORAL
  Filled 2022-10-02 (×4): qty 2

## 2022-10-02 MED ORDER — METOPROLOL TARTRATE 25 MG PO TABS
25.0000 mg | ORAL_TABLET | Freq: Two times a day (BID) | ORAL | Status: DC
  Administered 2022-10-02 – 2022-10-05 (×7): 25 mg via ORAL
  Filled 2022-10-02 (×7): qty 1

## 2022-10-02 MED ORDER — GABAPENTIN 300 MG PO CAPS
300.0000 mg | ORAL_CAPSULE | Freq: Three times a day (TID) | ORAL | Status: DC
  Administered 2022-10-02 – 2022-10-05 (×10): 300 mg via ORAL
  Filled 2022-10-02 (×10): qty 1

## 2022-10-02 MED ORDER — DONEPEZIL HCL 10 MG PO TABS
5.0000 mg | ORAL_TABLET | Freq: Every day | ORAL | Status: DC
  Administered 2022-10-02 – 2022-10-04 (×3): 5 mg via ORAL
  Filled 2022-10-02 (×3): qty 1

## 2022-10-02 MED ORDER — ROSUVASTATIN CALCIUM 5 MG PO TABS
5.0000 mg | ORAL_TABLET | Freq: Every day | ORAL | Status: DC
  Administered 2022-10-02 – 2022-10-05 (×4): 5 mg via ORAL
  Filled 2022-10-02 (×4): qty 1

## 2022-10-02 MED ORDER — EZETIMIBE 10 MG PO TABS
5.0000 mg | ORAL_TABLET | Freq: Every day | ORAL | Status: DC
  Administered 2022-10-02 – 2022-10-05 (×4): 5 mg via ORAL
  Filled 2022-10-02 (×4): qty 1

## 2022-10-02 NOTE — Progress Notes (Signed)
   10/02/22 0330  Clinical Encounter Type  Visited With Patient not available  Visit Type Initial;Trauma  Referral From Nurse  Consult/Referral To Chaplain   Chaplain responded to a level two trauma. The patient, [name], was under the care of the medical team. No family is present at this time. If a chaplain is requested someone will respond.   Danice Goltz [facility]  [phone]

## 2022-10-02 NOTE — Evaluation (Signed)
Physical Therapy Evaluation Patient Details Name: [name] MRN: [id number] DOB: October 23, 1933 Today's Date: 10/02/2022  History of Present Illness  88 y.o. year-old male presents to the ED with chief complaint of ground-level fall. L 4-7 rib fractures with displacement, Subdural hematoma.  PMH includes: CAD, GERD, HTN, hx of falls, OSA, arthritis.   Clinical Impression  Pt admitted with above diagnosis. PTA pt lived in [place] with his wife, mod I mobility without AD. Pt currently with functional limitations due to the deficits listed below (see PT Problem List). On eval, pt required min assist bed mobility, min assist sit to stand, and min assist face-to-face step pivot transfer bed to recliner. Pt will benefit from skilled PT to increase their independence and safety with mobility to allow discharge to the venue listed below.          Recommendations for follow up therapy are one component of a multi-disciplinary discharge planning process, led by the attending physician.  Recommendations may be updated based on patient status, additional functional criteria and insurance authorization.  Follow Up Recommendations Skilled nursing-short term rehab (<3 hours/day) Can patient physically be transported by private vehicle: Yes    Assistance Recommended at Discharge Frequent or constant Supervision/Assistance  Patient can return home with the following  A little help with walking and/or transfers;A lot of help with bathing/dressing/bathroom;Assist for transportation;Assistance with cooking/housework    Equipment Recommendations Rolling walker (2 wheels)  Recommendations for Other Services       Functional Status Assessment Patient has had a recent decline in their functional status and demonstrates the ability to make significant improvements in function in a reasonable and predictable amount of time.     Precautions / Restrictions Precautions Precautions: Fall      Mobility  Bed  Mobility Overal bed mobility: Needs Assistance Bed Mobility: Supine to Sit     Supine to sit: Min assist, HOB elevated     General bed mobility comments: +rail, increased time    Transfers Overall transfer level: Needs assistance Equipment used: 1 person hand held assist Transfers: Sit to/from Stand, Bed to chair/wheelchair/BSC Sit to Stand: Min assist   Step pivot transfers: Min assist       General transfer comment: assist to power up, face-to-face step pivot transfer bed to recliner, cues for sequencing    Ambulation/Gait                  Stairs            Wheelchair Mobility    Modified Rankin (Stroke Patients Only)       Balance Overall balance assessment: Needs assistance Sitting-balance support: No upper extremity supported, Feet supported Sitting balance-Leahy Scale: Good     Standing balance support: Bilateral upper extremity supported, During functional activity Standing balance-Leahy Scale: Poor Standing balance comment: reliant on external support                             Pertinent Vitals/Pain Pain Assessment Pain Assessment: Faces Faces Pain Scale: Hurts even more Pain Location: L ribs Pain Descriptors / Indicators: Grimacing, Guarding, Sharp Pain Intervention(s): Premedicated before session, Monitored during session, Repositioned    Home Living Family/patient expects to be discharged to:: Private residence Living Arrangements: Spouse/significant other Available Help at Discharge: Family;Available 24 hours/day Type of Home: Independent living facility         Home Layout: One level   Additional Comments: ILF  Prior Function Prior Level of Function : Independent/Modified Independent             Mobility Comments: States he does not use RW or SPC ADLs Comments: Continues to be Ind with ADL.  No longer drives.     Hand Dominance   Dominant Hand: Right    Extremity/Trunk Assessment   Upper  Extremity Assessment Upper Extremity Assessment: Defer to OT evaluation    Lower Extremity Assessment Lower Extremity Assessment: Generalized weakness    Cervical / Trunk Assessment Cervical / Trunk Assessment: Other exceptions Cervical / Trunk Exceptions: Rib fractures.  Communication   Communication: No difficulties;HOH  Cognition Arousal/Alertness: Awake/alert Behavior During Therapy: WFL for tasks assessed/performed Overall Cognitive Status: History of cognitive impairments - at baseline                                          General Comments General comments (skin integrity, edema, etc.): VSS on 2L    Exercises     Assessment/Plan    PT Assessment Patient needs continued PT services  PT Problem List Decreased strength;Decreased balance;Decreased knowledge of precautions;Decreased mobility;Decreased activity tolerance;Decreased safety awareness       PT Treatment Interventions DME instruction;Functional mobility training;Balance training;Patient/family education;Gait training;Therapeutic activities;Therapeutic exercise    PT Goals (Current goals can be found in the Care Plan section)  Acute Rehab PT Goals Patient Stated Goal: return to ILF PT Goal Formulation: With patient Time For Goal Achievement: 10/16/22 Potential to Achieve Goals: Good    Frequency Min 3X/week     Co-evaluation               AM-PAC PT "6 Clicks" Mobility  Outcome Measure Help needed turning from your back to your side while in a flat bed without using bedrails?: A Little Help needed moving from lying on your back to sitting on the side of a flat bed without using bedrails?: A Little Help needed moving to and from a bed to a chair (including a wheelchair)?: A Little Help needed standing up from a chair using your arms (e.g., wheelchair or bedside chair)?: A Little Help needed to walk in hospital room?: A Lot Help needed climbing 3-5 steps with a railing? :  Total 6 Click Score: 15    End of Session Equipment Utilized During Treatment: Gait belt Activity Tolerance: Patient tolerated treatment well Patient left: in chair;with call bell/phone within reach;with chair alarm set Nurse Communication: Mobility status PT Visit Diagnosis: Muscle weakness (generalized) (M62.81);Difficulty in walking, not elsewhere classified (R26.2);Pain    Time: 1208-1232 PT Time Calculation (min) (ACUTE ONLY): 24 min   Charges:   PT Evaluation $PT Eval Moderate Complexity: 1 Mod PT Treatments $Therapeutic Activity: 8-22 mins        Lorrin Goodell, PT  Office # [phone] Pager [phone]   Lorriane Shire 10/02/2022, 2:04 PM

## 2022-10-02 NOTE — H&P (Signed)
Admitting Physician: Nickola Major Zohaib Heeney  Service: Trauma Surgery  CC: Fall  Subjective   Mechanism of Injury: ASHTAN GIRTMAN is an 88 y.o. male who presented as a level 2 trauma after a fall.  He fell multiple times today.  He fell 10 days ago and was evaluated in the ER.  Past Medical History:  Diagnosis Date   CAD (coronary artery disease)    a. s/p inferior STEMI 01/08/2014; LHC 01/08/14: total RCA occlusion s/p 3.5x21m Xience DES distal RCA and 3.5x28 mm DES mid RCA, 60-70% mid LAD stenosis, EF 55%.   Complication of anesthesia    'long to wake up after back surgery " 09/2003   Diverticulosis of colon    GERD (gastroesophageal reflux disease)    History of cellulitis    05-26-2015  LLE   History of colon polyps    1998- benign/  2008 adenomatous    History of kidney stones    2013   History of squamous cell carcinoma excision    2013;  2015;  06-12-2015 right leg/  02/ and 05/ 2017  left ear and left leg   Hydronephrosis, left    Hyperlipidemia    Hypertension    Migraine with aura    OSA on CPAP 06/19/2015   Moderate OSA with AHI 18/hr  per study 05-20-2015   Osteoarthritis    [facility]ys[facility] ([facility] 4/16   chads2vasc score is at least 4   Premature atrial contractions    RA (rheumatoid arthritis) ([facility]    rheumatologist-  dr jLeigh Aurora  Sinusitis, chronic 10/17/2015   Wears glasses    Wears hearing aid    bilateral    Past Surgical History:  Procedure Laterality Date   BACK SURGERY     CARDIOVASCULAR STRESS TEST  11/21/2015   Low risk nuclear study w/ a small diaphragmatic attenuation artifact, no ischemia/  normal LV function and wall motion , ef 63%   CATARACT EXTRACTION W/ INTRAOCULAR LENS  IMPLANT, BILATERAL  2015   COLONOSCOPY  last one 06-09-2011   CORONARY ANGIOPLASTY     CYSTOSCOPY W/ RETROGRADES Left 07/12/2016   Procedure: CYSTOSCOPY WITH RETROGRADE PYELOGRAM LEFT URETERAL STENT;  Surgeon: JIrine Seal MD;  Location: [facility];   Service: Urology;  Laterality: Left;   CYSTOSCOPY WITH RETROGRADE PYELOGRAM, URETEROSCOPY AND STENT PLACEMENT Left 08/11/2016   Procedure: CYSTOSCOPY WITH RETROGRADE PYELOGRAM,  DIAGNOSTIC URETEROSCOPY , STENT EXCHANGE;  Surgeon: TAlexis Frock MD;  [facility]  Service: Urology;  Laterality: Left;   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   severe fibromatosis palm and fingers   LEFT HEART CATHETERIZATION WITH CORONARY ANGIOGRAM N/A 01/08/2014   Procedure: LEFT HEART CATHETERIZATION WITH CORONARY ANGIOGRAM;  Surgeon: TTroy Sine MD;  [facility];  Service: Cardiovascular;  Laterality:N/A;  total/ subtotal RCA/  mLAD 616-10%w/ mid systolic bridging/  preserved global LVF, ef 55%   MOHS SURGERY  x2  feb and may 2017   left ear /  left leg  ([facility])   PERCUTANEOUS CORONARY STENT INTERVENTION (PCI-S)  01/08/2014   Procedure: PERCUTANEOUS CORONARY STENT INTERVENTION (PCI-S);  Surgeon: TTroy Sine MD;  [facility];  Service: Cardiovascular;;  DES to mid and distal RCA   POSTERIOR LUMBAR FUSION  10/01/2003   and Laminectomy/ diskectomy  L4 -- S1   ROBOT ASSISTED PYELOPLASTY Left 09/15/2016   Procedure: XI ROBOTIC ASSISTED PYELOPLASTY;  Surgeon: TAlexis Frock MD;  Location: [facility];  Service: Urology;  Laterality: Left;   TONSILLECTOMY     TRANSTHORACIC ECHOCARDIOGRAM  02/23/2016   mild LVH,  ef 50-55%,  grade 1 diastolic dysfunction/  mild to moderate AV calcification w/ no stenosis or regurg./  trivial MR and TR/  mild PR    Family History  Problem Relation Age of Onset   Hypertension Mother    Heart disease Father    Colon cancer Neg Hx     Social:  reports that he quit smoking about 54 years ago. His smoking use included cigarettes. He has never used smokeless tobacco. He reports current alcohol use of about 2.0 standard drinks of alcohol per week. He reports that he does not use drugs.  Allergies:  Allergies  Allergen Reactions    Methotrexate Other (See Comments)    REACTION: tachycardia   Aricept [Donepezil Hcl]     nightmares    Crestor [Rosuvastatin Calcium]     Myalgias with '20mg'$  dose   Lisinopril Other (See Comments)    cough   Namenda [Memantine]     n/d   Atorvastatin Other (See Comments)    Muscle pain, leg cramps   Pravastatin Sodium Other (See Comments)    REACTION: cramps, fatigue    Medications: Current Outpatient Medications  Medication Instructions   abatacept (ORENCIA) 250 MG injection Every 30 days   acetaminophen (TYLENOL) 1,000 mg, Oral, Every 6 hours PRN   aspirin EC 81 mg, Daily   donepezil (ARICEPT) 5 mg, Oral, Daily at bedtime   ELIQUIS 5 MG TABS tablet TAKE ONE TABLET BY MOUTH EVERY MORNING and TAKE ONE TABLET BY MOUTH EVERYDAY AT BEDTIME   escitalopram (LEXAPRO) 20 MG tablet TAKE ONE TABLET BY MOUTH ONCE DAILY   Evolocumab (REPATHA SURECLICK) 665 MG/ML SOAJ INJECT 1 PEN INTO SKIN EVERY 14 DAYS   ezetimibe (ZETIA) 10 MG tablet TAKE 1/2 TABLET BY MOUTH ONCE DAILY   LORazepam (ATIVAN) 0.5 mg, Oral, 2 times daily PRN   memantine (NAMENDA) 10 mg, Oral, Daily at bedtime   metoprolol tartrate (LOPRESSOR) 25 MG tablet TAKE ONE TABLET BY MOUTH EVERYDAY AT BEDTIME   nitroGLYCERIN (NITROSTAT) 0.4 MG SL tablet ONE TABLET UNDER TONGUE AS NEEDED FOR CHEST PAIN. MAY REPEAT IN 5 MINUTES AND AGAIN IN 5 MINUTES (TOTAL OF 3 TABLETS)   OLANZapine (ZYPREXA) 2.5 mg, Oral, Daily at bedtime   omeprazole (PRILOSEC) 40 MG capsule TAKE ONE CAPSULE BY MOUTH ONCE DAILY   rosuvastatin (CRESTOR) 5 MG tablet TAKE ONE TABLET BY MOUTH ONCE DAILY   triamcinolone cream (KENALOG) 0.1 % 1 Application, Topical, 2 times daily, X 7 days   zolpidem (AMBIEN) 10 mg, Oral, At bedtime PRN    Objective   Primary Survey: Blood pressure (!) 183/89, pulse 65, temperature (!) 96.4 F (35.8 C), temperature source Temporal, resp. rate 16, SpO2 98 %. Airway: Patent, protecting airway Breathing: Bilateral breath sounds,  breathing spontaneously Circulation: Stable, Palpable peripheral pulses Disability: Moving all extremities,   GCS Eyes: 4 - Eyes open spontaneously  GCS Verbal: 5 - Oriented  GCS Motor: 6 - Obeys commands for movement  GCS 15  Environment/Exposure: Warm, dry  Secondary Survey: Head:  Traumatic w/ bruising across face Neck: Full range of motion without pain, no midline tenderness Chest: Bilateral breath sounds, chest wall stable, tendeer Abdomen: Soft, non-tender, non-distended Upper Extremities: Strength and sensation intact, palpable peripheral pulses Lower extremities: Strength and sensation intact, palpable peripheral pulses Back: No step offs or deformities, atraumatic Rectal: deferred Psych: Normal mood  and affect   Results for orders placed or performed during the hospital encounter of 10/02/22 (from the past 24 hour(s))  Comprehensive metabolic panel     Status: Abnormal   Collection Time: 10/02/22  3:50 AM  Result Value Ref Range   Sodium 140 135 - 145 mmol/L   Potassium 4.1 3.5 - 5.1 mmol/L   Chloride 106 98 - 111 mmol/L   CO2 23 22 - 32 mmol/L   Glucose, Bld 116 (H) 70 - 99 mg/dL   BUN 13 8 - 23 mg/dL   Creatinine, Ser 0.94 0.61 - 1.24 mg/dL   Calcium 9.1 8.9 - 10.3 mg/dL   Total Protein 7.0 6.5 - 8.1 g/dL   Albumin 4.0 3.5 - 5.0 g/dL   AST 24 15 - 41 U/L   ALT 16 0 - 44 U/L   Alkaline Phosphatase 61 38 - 126 U/L   Total Bilirubin 0.6 0.3 - 1.2 mg/dL   GFR, Estimated >60 >60 mL/min   Anion gap 11 5 - 15  I-Stat Chem 8, ED     Status: Abnormal   Collection Time: 10/02/22  3:50 AM  Result Value Ref Range   Sodium 142 135 - 145 mmol/L   Potassium 4.2 3.5 - 5.1 mmol/L   Chloride 106 98 - 111 mmol/L   BUN 14 8 - 23 mg/dL   Creatinine, Ser 1.00 0.61 - 1.24 mg/dL   Glucose, Bld 108 (H) 70 - 99 mg/dL   Calcium, Ion 1.19 1.15 - 1.40 mmol/L   TCO2 25 22 - 32 mmol/L   Hemoglobin 12.6 (L) 13.0 - 17.0 g/dL   HCT 37.0 (L) 39.0 - 52.0 %  CBC     Status: Abnormal    Collection Time: 10/02/22  3:50 AM  Result Value Ref Range   WBC 6.3 4.0 - 10.5 K/uL   RBC 3.78 (L) 4.22 - 5.81 MIL/uL   Hemoglobin 12.4 (L) 13.0 - 17.0 g/dL   HCT 37.5 (L) 39.0 - 52.0 %   MCV 99.2 80.0 - 100.0 fL   MCH 32.8 26.0 - 34.0 pg   MCHC 33.1 30.0 - 36.0 g/dL   RDW 13.6 11.5 - 15.5 %   Platelets 333 150 - 400 K/uL   nRBC 0.0 0.0 - 0.2 %  Ethanol     Status: None   Collection Time: 10/02/22  3:50 AM  Result Value Ref Range   Alcohol, Ethyl (B) <10 <10 mg/dL  Lactic acid, plasma     Status: None   Collection Time: 10/02/22  3:50 AM  Result Value Ref Range   Lactic Acid, Venous 1.9 0.5 - 1.9 mmol/L  Protime-INR     Status: None   Collection Time: 10/02/22  3:50 AM  Result Value Ref Range   Prothrombin Time 14.1 11.4 - 15.2 seconds   INR 1.1 0.8 - 1.2  Sample to Blood Bank     Status: None   Collection Time: 10/02/22  3:50 AM  Result Value Ref Range   Blood Bank Specimen SAMPLE AVAILABLE FOR TESTING    Sample Expiration      10/03/2022,2359 Performed at [facility], [address] [place], [place] [address]   Urinalysis, Routine w reflex microscopic Urine, Clean Catch     Status: Abnormal   Collection Time: 10/02/22  5:45 AM  Result Value Ref Range   Color, Urine YELLOW YELLOW   APPearance CLEAR CLEAR   Specific Gravity, Urine 1.016 1.005 - 1.030   pH  5.0 5.0 - 8.0   Glucose, UA NEGATIVE NEGATIVE mg/dL   Hgb urine dipstick MODERATE (A) NEGATIVE   Bilirubin Urine NEGATIVE NEGATIVE   Ketones, ur NEGATIVE NEGATIVE mg/dL   Protein, ur NEGATIVE NEGATIVE mg/dL   Nitrite NEGATIVE NEGATIVE   Leukocytes,Ua NEGATIVE NEGATIVE   RBC / HPF 11-20 0 - 5 RBC/hpf   WBC, UA 0-5 0 - 5 WBC/hpf   Bacteria, UA RARE (A) NONE SEEN   Mucus PRESENT      Imaging Orders         DG Chest Port 1 View         DG Pelvis Portable         CT HEAD WO CONTRAST         CT CERVICAL SPINE WO CONTRAST         DG Elbow 2 Views Left         CT CHEST ABDOMEN PELVIS W CONTRAST          CT HEAD WO CONTRAST (5MM)      Assessment and Plan   ZAIRE LEVESQUE is an 88 y.o. male who presented as a level 2 trauma after a fall.  Injuries: L 4-7 rib fractures with displacement - pain control, pulmonary toilet Subdural hematoma - eliquis reversed in ER, Neurosurgery consulted by ER provider Frequent falls - medicine consultation for a medical cause, PT/OT/CM consults   Consults:  Neurosurgery [facility]  FEN - Reg VTE - Sequential Compression Devices ID - None given in the trauma bay.  Dispo - Step-down unit    Felicie Morn, [facility] Use [email or web address] to contact on call provider  New Patient Billing: [phone] - High MDM

## 2022-10-02 NOTE — Plan of Care (Signed)

## 2022-10-02 NOTE — ED Notes (Signed)
Family updated as to patient's status (wife at bedside)

## 2022-10-02 NOTE — ED Triage Notes (Signed)
Pt arrives via [facility] from [facility] at [facility]. Pt was with his wife at time of the fall, hx of dementia. Pt had a mechanical fall, he tripped over a rug and fell.  no loc, on eliquis. This is the second fall today, initially agitated and c/o back pain on scene. He would not tolerate c collar. Has a left elbow and left eyebrow pain. 180/70, hr 60, 95% ra, cbg 112.

## 2022-10-02 NOTE — ED Notes (Signed)
Patient transported to CT 

## 2022-10-02 NOTE — ED Provider Notes (Signed)
[facility] Emergency Department Provider Note MRN:  [id number]  Arrival date & time: 10/02/22     Chief Complaint   Fall   History of Present Illness   [name] is a 88 y.o. year-old male presents to the ED with chief complaint of ground-level fall.  Patient was brought to the emergency department as a level 2 trauma (fall on thinners per department protocol).  Patient states that he tripped and fell on a rug.  He hit his left eyebrow and left elbow.  He complained of low back pain on the scene, but currently denies any pain.  He is anticoagulated on Eliquis.  This is his second fall today.  History provided by patient.   Review of Systems  Pertinent positive and negative review of systems noted in HPI.    Physical Exam   Vitals:   10/02/22 0434 10/02/22 0445  BP: (!) 172/96 (!) 183/89  Pulse: 60 65  Resp: 16 16  Temp:    SpO2: 96% 98%    CONSTITUTIONAL:  non toxic-appearing, NAD NEURO:  Alert and oriented x 3, CN 3-12 grossly intact EYES:  eyes equal and reactive ENT/NECK:  Supple, no stridor  CARDIO:  normal rate, regular rhythm, appears well-perfused  PULM:  No respiratory distress, CTAB GI/GU:  non-distended, non-tender MSK/SPINE:  No gross deformities, no edema, moves all extremities  SKIN:  no rash, atraumatic, abrasions to left eyebrow and left elbow   *Additional and/or pertinent findings included in MDM below  Diagnostic and Interventional Summary    EKG Interpretation  Date/Time:    Ventricular Rate:    PR Interval:    QRS Duration:   QT Interval:    QTC Calculation:   R Axis:     Text Interpretation:         Labs Reviewed  COMPREHENSIVE METABOLIC PANEL - Abnormal; Notable for the following components:      Result Value   Glucose, Bld 116 (*)    All other components within normal limits  CBC - Abnormal; Notable for the following components:   RBC 3.78 (*)    Hemoglobin 12.4 (*)    HCT 37.5 (*)    All other  components within normal limits  I-STAT CHEM 8, ED - Abnormal; Notable for the following components:   Glucose, Bld 108 (*)    Hemoglobin 12.6 (*)    HCT 37.0 (*)    All other components within normal limits  ETHANOL  LACTIC ACID, PLASMA  PROTIME-INR  URINALYSIS, ROUTINE W REFLEX MICROSCOPIC  SAMPLE TO BLOOD BANK    CT CHEST ABDOMEN PELVIS W CONTRAST  Final Result    CT HEAD WO CONTRAST  Final Result    CT CERVICAL SPINE WO CONTRAST  Final Result    DG Chest Port 1 View  Final Result    DG Pelvis Portable  Final Result    DG Elbow 2 Views Left  Final Result    CT HEAD WO CONTRAST (5MM)    (Results Pending)    Medications  fentaNYL (SUBLIMAZE) injection 50 mcg (50 mcg Intravenous Given 10/02/22 0539)  coag fact Xa recombinant (ANDEXXA) low dose infusion 900 mg (900 mg Intravenous New Bag/Given 10/02/22 0541)  iohexol (OMNIPAQUE) 350 MG/ML injection 75 mL (75 mLs Intravenous Contrast Given 10/02/22 0529)     Procedures  /  Critical Care .Critical Care  Performed by: Montine Circle, PA-C Authorized by: Montine Circle, PA-C   Critical care provider statement:  Critical care time (minutes):  36   Critical care was necessary to treat or prevent imminent or life-threatening deterioration of the following conditions:  Trauma   Critical care was time spent personally by me on the following activities:  Development of treatment plan with patient or surrogate, discussions with consultants, evaluation of patient's response to treatment, examination of patient, ordering and review of laboratory studies, ordering and review of radiographic studies, ordering and performing treatments and interventions, pulse oximetry, re-evaluation of patient's condition and review of old charts   ED Course and Medical Decision Making  I have reviewed the triage vital signs, the nursing notes, and pertinent available records from the EMR.  Social Determinants Affecting Complexity of  Care: Patient has no clinically significant social determinants affecting this chief complaint..   ED Course:    Medical Decision Making Patient here as a level 2 trauma, fall on thinners.  Patient seen by myself and Dr. Dina Rich.    Primary survey notable for ABCs intact, slight confusion, GCS 14.  We will proceed with labs and imaging. Secondary survey notable for contusions to left forehead and left elbow.  Left lateral chest wall tenderness.  No focal abdominal tenderness.  CT is discussed as below.  Small subdural noted on CT.  Patient also has several rib fractures.  We will CT chest/abdomen/pelvis.  CT noted for left fourth through sixth rib fractures and also the seventh rib.  Small subpleural hematoma.  Stenosis of the left subclavian artery.  Will discuss with trauma surgery.  Feel that patient will need admission for observation due to subdural hematoma as well as pulmonary toilet and pain control.    Amount and/or Complexity of Data Reviewed Labs: ordered.    Details: Mild anemia of 12.6, Eliquis reversed, no significant electrolyte derangement Radiology: ordered and independent interpretation performed.    Details: Small subdural hematoma  Risk Prescription drug management. Decision regarding hospitalization.     Consultants: I consulted with Reinaldo Meeker, PA, from neurosurgery, who agrees with reversal of eliquis.  Repeat CT in 6 hours.    I consulted with Dr. Thermon Leyland, who is appreciated for admitting.   Treatment and Plan: Patient's exam and diagnostic results are concerning for SDH and multiple rib fractures.  Feel that patient will need admission to the hospital for further treatment and evaluation.  Patient seen by and discussed with attending physician, Dr. Dina Rich, who agrees with plan.  Final Clinical Impressions(s) / ED Diagnoses     ICD-10-CM   1. Subdural hematoma ([place])  S06.5XAA     2. Fall, initial encounter  W19.XXXA     3. Closed fracture  of multiple ribs of left side, initial encounter  S22.42XA       ED Discharge Orders     None         Discharge Instructions Discussed with and Provided to Patient:   Discharge Instructions   None      Montine Circle, PA-C 10/02/22 0602    Dina Rich Barbette Hair, MD 10/02/22 [phone]

## 2022-10-02 NOTE — ED Notes (Signed)
Trauma Response Nurse Documentation   [name] is a 88 y.o. male arriving to [facility] ED via EMS  On Eliquis (apixaban) daily. Trauma was activated as a Level 2 by ED charge RN based on the following trauma criteria Elderly patients > 65 with head trauma on anti-coagulation (excluding ASA). Trauma team at the bedside on patient arrival.   Patient cleared for CT by Dr. Dina Rich EDP. Pt transported to CT with trauma response nurse present to monitor. RN remained with the patient throughout their absence from the department for clinical observation.   GCS 14, confused.  History   Past Medical History:  Diagnosis Date   CAD (coronary artery disease)    a. s/p inferior STEMI 01/08/2014; LHC 01/08/14: total RCA occlusion s/p 3.5x39m Xience DES distal RCA and 3.5x28 mm DES mid RCA, 60-70% mid LAD stenosis, EF 55%.   Complication of anesthesia    'long to wake up after back surgery " 09/2003   Diverticulosis of colon    GERD (gastroesophageal reflux disease)    History of cellulitis    05-26-2015  LLE   History of colon polyps    1998- benign/  2008 adenomatous    History of kidney stones    2013   History of squamous cell carcinoma excision    2013;  2015;  06-12-2015 right leg/  02/ and 05/ 2017  left ear and left leg   Hydronephrosis, left    Hyperlipidemia    Hypertension    Migraine with aura    OSA on CPAP 06/19/2015   Moderate OSA with AHI 18/hr  per study 05-20-2015   Osteoarthritis    Paroxysmal atrial fibrillation ([facility] 4/16   chads2vasc score is at least 4   Premature atrial contractions    RA (rheumatoid arthritis) ([facility]    rheumatologist-  dr jLeigh Aurora  Sinusitis, chronic 10/17/2015   Wears glasses    Wears hearing aid    bilateral     Past Surgical History:  Procedure Laterality Date   BACK SURGERY     CARDIOVASCULAR STRESS TEST  11/21/2015   Low risk nuclear study w/ a small diaphragmatic attenuation artifact, no ischemia/  normal LV function and wall  motion , ef 63%   CATARACT EXTRACTION W/ INTRAOCULAR LENS  IMPLANT, BILATERAL  2015   COLONOSCOPY  last one 06-09-2011   CORONARY ANGIOPLASTY     CYSTOSCOPY W/ RETROGRADES Left 07/12/2016   Procedure: CYSTOSCOPY WITH RETROGRADE PYELOGRAM LEFT URETERAL STENT;  Surgeon: JIrine Seal MD;  Location: [facility];  Service: Urology;  Laterality: Left;   CYSTOSCOPY WITH RETROGRADE PYELOGRAM, URETEROSCOPY AND STENT PLACEMENT Left 08/11/2016   Procedure: CYSTOSCOPY WITH RETROGRADE PYELOGRAM,  DIAGNOSTIC URETEROSCOPY , STENT EXCHANGE;  Surgeon: TAlexis Frock MD;  [facility]  Service: Urology;  Laterality: Left;   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   severe fibromatosis palm and fingers   LEFT HEART CATHETERIZATION WITH CORONARY ANGIOGRAM N/A 01/08/2014   Procedure: LEFT HEART CATHETERIZATION WITH CORONARY ANGIOGRAM;  Surgeon: TTroy Sine MD;  [facility];  Service: Cardiovascular;  Laterality:N/A;  total/ subtotal RCA/  mLAD 681-85%w/ mid systolic bridging/  preserved global LVF, ef 55%   MOHS SURGERY  x2  feb and may 2017   left ear /  left leg  ([facility])   PERCUTANEOUS CORONARY STENT INTERVENTION (PCI-S)  01/08/2014   Procedure: PERCUTANEOUS CORONARY STENT INTERVENTION (PCI-S);  Surgeon: TTroy Sine MD;  [facility];  Service: Cardiovascular;;  DES to mid and distal RCA   POSTERIOR LUMBAR FUSION  10/01/2003   and Laminectomy/ diskectomy  L4 -- S1   ROBOT ASSISTED PYELOPLASTY Left 09/15/2016   Procedure: XI ROBOTIC ASSISTED PYELOPLASTY;  Surgeon: Alexis Frock, MD;  Location: [facility];  Service: Urology;  Laterality: Left;   TONSILLECTOMY     TRANSTHORACIC ECHOCARDIOGRAM  02/23/2016   mild LVH,  ef 50-55%,  grade 1 diastolic dysfunction/  mild to moderate AV calcification w/ no stenosis or regurg./  trivial MR and TR/  mild PR       Initial Focused Assessment (If applicable, or please see trauma documentation): Confused male arrives after a fall  x2 today, left brow hematoma and skin tear, left shoulder and rib cage pain, left elbow hematoma and skin tear Airway patent, unobstructed. BS clear No obvious uncontrolled hemorrhage GCS 14, confused to location, thinks he's in [place]  CT's Completed:   CT Head C-spine, CAP  Interventions:  IV start and trauma lab draw CTs as above Portable chest, pelvis and left elbow XRAY Would care, dressings TDAP updated 09/22/2022  Plan for disposition:  Admit  Consults completed:  Neurosurgeon   Event Summary: Presents after two falls today at his independent living facility. Head injury and left elbow injury. Wounds cleaned and dressings applied, steristrips to left brow wound. TDAP updated earlier this month. MTP Summary (If applicable): NA  Bedside handoff with ED RN Claiborne Billings.    [name]  Trauma Response RN  Please call TRN at [phone] for further assistance.

## 2022-10-02 NOTE — Consult Note (Signed)
Reason for Consult: Subdural hemorrhage Referring Physician: Trauma  [name] is an 88 y.o. male.  HPI: 88 year old male status post ground-level fall yesterday secondary to slipping and falling.  Patient landed on his chest.  Patient with immediate onset of chest wall pain.  Patient also had a significant fall 2 weeks ago with laceration in his left periorbital area.  The patient is chronically anticoagulated on Eliquis.  He notes some mild headache but most of his pain is in his left chest.  He has no radiating pain numbness or weakness.  He is having no speech or language difficulty.  There is no history of seizure.  His Eliquis has been stopped and reversed.  Past Medical History:  Diagnosis Date   CAD (coronary artery disease)    a. s/p inferior STEMI 01/08/2014; LHC 01/08/14: total RCA occlusion s/p 3.5x80m Xience DES distal RCA and 3.5x28 mm DES mid RCA, 60-70% mid LAD stenosis, EF 55%.   Complication of anesthesia    'long to wake up after back surgery " 09/2003   Diverticulosis of colon    GERD (gastroesophageal reflux disease)    History of cellulitis    05-26-2015  LLE   History of colon polyps    1998- benign/  2008 adenomatous    History of kidney stones    2013   History of squamous cell carcinoma excision    2013;  2015;  06-12-2015 right leg/  02/ and 05/ 2017  left ear and left leg   Hydronephrosis, left    Hyperlipidemia    Hypertension    Migraine with aura    OSA on CPAP 06/19/2015   Moderate OSA with AHI 18/hr  per study 05-20-2015   Osteoarthritis    Paroxysmal atrial fibrillation ([facility] 4/16   chads2vasc score is at least 4   Premature atrial contractions    RA (rheumatoid arthritis) ([facility]    rheumatologist-  dr jLeigh Aurora  Sinusitis, chronic 10/17/2015   Wears glasses    Wears hearing aid    bilateral    Past Surgical History:  Procedure Laterality Date   BACK SURGERY     CARDIOVASCULAR STRESS TEST  11/21/2015   Low risk nuclear study w/ a small  diaphragmatic attenuation artifact, no ischemia/  normal LV function and wall motion , ef 63%   CATARACT EXTRACTION W/ INTRAOCULAR LENS  IMPLANT, BILATERAL  2015   COLONOSCOPY  last one 06-09-2011   CORONARY ANGIOPLASTY     CYSTOSCOPY W/ RETROGRADES Left 07/12/2016   Procedure: CYSTOSCOPY WITH RETROGRADE PYELOGRAM LEFT URETERAL STENT;  Surgeon: JIrine Seal MD;  Location: [facility];  Service: Urology;  Laterality: Left;   CYSTOSCOPY WITH RETROGRADE PYELOGRAM, URETEROSCOPY AND STENT PLACEMENT Left 08/11/2016   Procedure: CYSTOSCOPY WITH RETROGRADE PYELOGRAM,  DIAGNOSTIC URETEROSCOPY , STENT EXCHANGE;  Surgeon: TAlexis Frock MD;  [facility]  Service: Urology;  Laterality: Left;   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   severe fibromatosis palm and fingers   LEFT HEART CATHETERIZATION WITH CORONARY ANGIOGRAM N/A 01/08/2014   Procedure: LEFT HEART CATHETERIZATION WITH CORONARY ANGIOGRAM;  Surgeon: TTroy Sine MD;  [facility];  Service: Cardiovascular;  Laterality:N/A;  total/ subtotal RCA/  mLAD 619-41%w/ mid systolic bridging/  preserved global LVF, ef 55%   MOHS SURGERY  x2  feb and may 2017   left ear /  left leg  ([facility])   PERCUTANEOUS CORONARY STENT INTERVENTION (PCI-S)  01/08/2014   Procedure: PERCUTANEOUS  CORONARY STENT INTERVENTION (PCI-S);  Surgeon: Troy Sine, MD;  Location: [facility] LAB;  Service: Cardiovascular;;  DES to mid and distal RCA   POSTERIOR LUMBAR FUSION  10/01/2003   and Laminectomy/ diskectomy  L4 -- S1   ROBOT ASSISTED PYELOPLASTY Left 09/15/2016   Procedure: XI ROBOTIC ASSISTED PYELOPLASTY;  Surgeon: Alexis Frock, MD;  Location: [facility];  Service: Urology;  Laterality: Left;   TONSILLECTOMY     TRANSTHORACIC ECHOCARDIOGRAM  02/23/2016   mild LVH,  ef 50-55%,  grade 1 diastolic dysfunction/  mild to moderate AV calcification w/ no stenosis or regurg./  trivial MR and TR/  mild PR    Family History  Problem Relation Age of  Onset   Hypertension Mother    Heart disease Father    Colon cancer Neg Hx     Social History:  reports that he quit smoking about 54 years ago. His smoking use included cigarettes. He has never used smokeless tobacco. He reports current alcohol use of about 2.0 standard drinks of alcohol per week. He reports that he does not use drugs.  Allergies:  Allergies  Allergen Reactions   Methotrexate Other (See Comments)    REACTION: tachycardia   Aricept [Donepezil Hcl]     nightmares    Crestor [Rosuvastatin Calcium]     Myalgias with '20mg'$  dose   Lisinopril Other (See Comments)    cough   Namenda [Memantine]     n/d   Atorvastatin Other (See Comments)    Muscle pain, leg cramps   Pravastatin Sodium Other (See Comments)    REACTION: cramps, fatigue    Medications: I have reviewed the patient's current medications.  Results for orders placed or performed during the hospital encounter of 10/02/22 (from the past 48 hour(s))  Comprehensive metabolic panel     Status: Abnormal   Collection Time: 10/02/22  3:50 AM  Result Value Ref Range   Sodium 140 135 - 145 mmol/L   Potassium 4.1 3.5 - 5.1 mmol/L   Chloride 106 98 - 111 mmol/L   CO2 23 22 - 32 mmol/L   Glucose, Bld 116 (H) 70 - 99 mg/dL    Comment: Glucose reference range applies only to samples taken after fasting for at least 8 hours.   BUN 13 8 - 23 mg/dL   Creatinine, Ser 0.94 0.61 - 1.24 mg/dL   Calcium 9.1 8.9 - 10.3 mg/dL   Total Protein 7.0 6.5 - 8.1 g/dL   Albumin 4.0 3.5 - 5.0 g/dL   AST 24 15 - 41 U/L   ALT 16 0 - 44 U/L   Alkaline Phosphatase 61 38 - 126 U/L   Total Bilirubin 0.6 0.3 - 1.2 mg/dL   GFR, Estimated >60 >60 mL/min    Comment: (NOTE) Calculated using the CKD-EPI Creatinine Equation (2021)    Anion gap 11 5 - 15    Comment: Performed at [facility] [address], [place], [place] [address]  I-Stat Chem 8, ED     Status: Abnormal   Collection Time: 10/02/22  3:50 AM  Result Value Ref Range    Sodium 142 135 - 145 mmol/L   Potassium 4.2 3.5 - 5.1 mmol/L   Chloride 106 98 - 111 mmol/L   BUN 14 8 - 23 mg/dL   Creatinine, Ser 1.00 0.61 - 1.24 mg/dL   Glucose, Bld 108 (H) 70 - 99 mg/dL    Comment: Glucose reference range applies only to samples taken after  fasting for at least 8 hours.   Calcium, Ion 1.19 1.15 - 1.40 mmol/L   TCO2 25 22 - 32 mmol/L   Hemoglobin 12.6 (L) 13.0 - 17.0 g/dL   HCT 37.0 (L) 39.0 - 52.0 %  CBC     Status: Abnormal   Collection Time: 10/02/22  3:50 AM  Result Value Ref Range   WBC 6.3 4.0 - 10.5 K/uL   RBC 3.78 (L) 4.22 - 5.81 MIL/uL   Hemoglobin 12.4 (L) 13.0 - 17.0 g/dL   HCT 37.5 (L) 39.0 - 52.0 %   MCV 99.2 80.0 - 100.0 fL   MCH 32.8 26.0 - 34.0 pg   MCHC 33.1 30.0 - 36.0 g/dL   RDW 13.6 11.5 - 15.5 %   Platelets 333 150 - 400 K/uL   nRBC 0.0 0.0 - 0.2 %    Comment: Performed at [facility], [place] [address] [place], [place] [address]  Ethanol     Status: None   Collection Time: 10/02/22  3:50 AM  Result Value Ref Range   Alcohol, Ethyl (B) <10 <10 mg/dL    Comment: (NOTE) Lowest detectable limit for serum alcohol is 10 mg/dL.  For medical purposes only. Performed at [facility], [place] [address], [place], [place] [address]   Lactic acid, plasma     Status: None   Collection Time: 10/02/22  3:50 AM  Result Value Ref Range   Lactic Acid, Venous 1.9 0.5 - 1.9 mmol/L    Comment: Performed at [facility] [address] [place], [place] [address]  Protime-INR     Status: None   Collection Time: 10/02/22  3:50 AM  Result Value Ref Range   Prothrombin Time 14.1 11.4 - 15.2 seconds   INR 1.1 0.8 - 1.2    Comment: (NOTE) INR goal varies based on device and disease states. Performed at [facility], [place] [address], [place], [place] [address]   Sample to Blood Bank     Status: None   Collection Time: 10/02/22  3:50 AM  Result Value Ref Range   Blood Bank Specimen SAMPLE AVAILABLE FOR TESTING    Sample  Expiration      10/03/2022,2359 Performed at [facility], [place] [address] [place], [place] [address]   Urinalysis, Routine w reflex microscopic Urine, Clean Catch     Status: Abnormal   Collection Time: 10/02/22  5:45 AM  Result Value Ref Range   Color, Urine YELLOW YELLOW   APPearance CLEAR CLEAR   Specific Gravity, Urine 1.016 1.005 - 1.030   pH 5.0 5.0 - 8.0   Glucose, UA NEGATIVE NEGATIVE mg/dL   Hgb urine dipstick MODERATE (A) NEGATIVE   Bilirubin Urine NEGATIVE NEGATIVE   Ketones, ur NEGATIVE NEGATIVE mg/dL   Protein, ur NEGATIVE NEGATIVE mg/dL   Nitrite NEGATIVE NEGATIVE   Leukocytes,Ua NEGATIVE NEGATIVE   RBC / HPF 11-20 0 - 5 RBC/hpf   WBC, UA 0-5 0 - 5 WBC/hpf   Bacteria, UA RARE (A) NONE SEEN   Mucus PRESENT     Comment: Performed at [facility], [place] [address], [place], [place] [address]    CT CHEST ABDOMEN PELVIS W CONTRAST  Result Date: 10/02/2022 CLINICAL DATA:  88 year old male history of trauma from a fall. EXAM: CT CHEST, ABDOMEN, AND PELVIS WITH CONTRAST TECHNIQUE: Multidetector CT imaging of the chest, abdomen and pelvis was performed following the standard protocol during bolus administration of intravenous  contrast. RADIATION DOSE REDUCTION: This exam was performed according to the departmental dose-optimization program which includes automated exposure control, adjustment of the mA and/or kV according to patient size and/or use of iterative reconstruction technique. CONTRAST:  89m OMNIPAQUE IOHEXOL 350 MG/ML SOLN COMPARISON:  Chest CT 04/10/2019. CT of the abdomen and pelvis 09/17/2016. FINDINGS: CT CHEST FINDINGS Cardiovascular: Heart size is normal. There is no significant pericardial fluid, thickening or pericardial calcification. There is aortic atherosclerosis, as well as atherosclerosis of the great vessels of the mediastinum and the coronary arteries, including calcified atherosclerotic plaque in the left main, left anterior descending, left  circumflex and right coronary arteries. Thickening and calcification of the aortic valve. Severe calcifications of the mitral annulus. 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 Left vertebral artery origin is densely calcified, and may be occluded or nearly completely occluded, although the left vertebral artery is patent distally. Mediastinum/Nodes: No high attenuation fluid collection in the mediastinum to suggest posttraumatic hematoma. No pathologically enlarged mediastinal or hilar lymph nodes. Esophagus is unremarkable in appearance. No axillary lymphadenopathy. Lungs/Pleura: No pneumothorax. There is some focal high attenuation along the posterolateral left chest wall adjacent to several left-sided rib fractures (discussed below), likely to represent some subpleural hemorrhage, measuring up to 1.8 cm in thickness. No freely flowing left pleural fluid collection to suggest hemothorax. No right pleural fluid collection. Patchy ill-defined opacities are noted in the dependent portion of the left lung base, likely areas of subsegmental atelectasis. No confluent consolidative airspace disease. A few scattered tiny pulmonary nodules are noted, largest of which is a subpleural nodule in the periphery of the right middle lobe (axial image 94 of series 5) measuring 4 mm. These are stable compared to prior study from 04/10/2019, considered definitively benign (no imaging follow-up recommended). No larger more suspicious appearing pulmonary nodules or masses are noted. Musculoskeletal: Acute displaced fractures of the left posterior fourth, fifth and sixth ribs are noted. Potential nondisplaced fracture of the posterior left seventh rib also noted. No right-sided rib fractures or aggressive appearing lytic or blastic lesions are noted elsewhere in the visualized portions of the skeleton. CT ABDOMEN PELVIS FINDINGS Hepatobiliary: No evidence of significant  acute traumatic injury to the liver. No suspicious cystic or solid hepatic lesions. No intra or extrahepatic biliary ductal dilatation. Gallbladder is unremarkable in appearance. Pancreas: No evidence of significant acute traumatic injury to the pancreas. No pancreatic mass. No pancreatic ductal dilatation. No pancreatic or peripancreatic fluid collections or inflammatory changes. Spleen: No evidence of significant acute traumatic injury to the spleen. Spleen is normal in appearance. Adrenals/Urinary Tract: No evidence of significant acute traumatic injury to either kidney or adrenal gland. Small cortical calcification in the upper pole of the left kidney, benign in appearance. No aggressive appearing renal lesions. No hydroureteronephrosis. Urinary bladder appears intact and is normal in appearance. Bilateral adrenal glands are normal in appearance. Stomach/Bowel: No evidence of significant acute traumatic injury to the hollow viscera. Mural calcification in the cardia of the stomach (axial image 58 of series 4) stable compared to prior examinations, presumably benign. No pathologic dilatation of small bowel or colon. Numerous colonic diverticula are noted, particularly in the descending colon and sigmoid colon, without surrounding inflammatory changes to suggest an acute diverticulitis at this time. Normal appendix. Vascular/Lymphatic: No evidence of significant acute traumatic injury to the abdominal aorta or major arteries/veins of the abdomen and pelvis. Extensive atherosclerosis throughout the abdominal aorta and pelvic  vasculature, without evidence of aneurysm or dissection. No lymphadenopathy noted in the abdomen or pelvis. Reproductive: Prostate gland and seminal vesicles are unremarkable in appearance. Other: No high attenuation fluid collection in the peritoneal cavity or retroperitoneum to suggest significant posttraumatic hemorrhage. No significant volume of ascites. No pneumoperitoneum.  Musculoskeletal: No acute displaced fractures or aggressive appearing lytic or blastic lesions are noted in the visualized portions of the skeleton. Interbody cages are noted at the L4-L5 and L5-S1 interspaces. IMPRESSION: 1. Acute mildly displaced fractures of the posterior left fourth, fifth and sixth ribs, with probable nondisplaced fracture of the posterior left seventh rib also noted. There is a small amount of subpleural hematoma adjacent to these rib fractures, and some subsegmental atelectasis in the left lung base presumably from splinting. No pneumothorax, hemothorax or acute mediastinal hemorrhage. 2. No evidence of significant acute traumatic injury to the abdomen or pelvis. 3. 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 Clinical correlation for signs and symptoms of subclavian steal syndrome is suggested. 4. There are calcifications of the aortic valve and mitral annulus. Echocardiographic correlation for evaluation of potential valvular dysfunction may be warranted if clinically indicated. 5. Aortic atherosclerosis, in addition to left main and three-vessel coronary artery disease. 6. Colonic diverticulosis without evidence of acute diverticulitis at this time. 7. Additional incidental findings, as above. Electronically Signed   By: Vinnie Langton M.D.   On: 10/02/2022 05:47   DG Elbow 2 Views Left  Result Date: 10/02/2022 CLINICAL DATA:  Level 2 trauma.  Fall on blood thinners. EXAM: LEFT ELBOW - 2 VIEW COMPARISON:  None Available. FINDINGS: There is no evidence of fracture, dislocation, or joint effusion. Atheromatous calcification in the arm. IMPRESSION: Negative for fracture or subluxation. Electronically Signed   By: Jorje Guild M.D.   On: 10/02/2022 04:31   DG Pelvis Portable  Result Date: 10/02/2022 CLINICAL DATA:  Level 2 trauma from ground level  fall EXAM: PORTABLE PELVIS 1-2 VIEWS COMPARISON:  None Available. FINDINGS: There is no evidence of pelvic fracture or diastasis. Both hips are located. Advanced lumbar spine degeneration with lower fusions. Atherosclerosis. IMPRESSION: No acute finding. Electronically Signed   By: Jorje Guild M.D.   On: 10/02/2022 04:27   DG Chest Port 1 View  Result Date: 10/02/2022 CLINICAL DATA:  No L2 trauma EXAM: PORTABLE CHEST 1 VIEW COMPARISON:  09/22/2022 FINDINGS: Artifact from EKG leads. Posterior left fourth through sixth rib fractures, interval and displaced. Low volume chest with interstitial crowding at the bases. No air bronchogram, edema, effusion, or pneumothorax. IMPRESSION: 1. Acute left fourth through sixth rib fractures with displacement. No hemothorax or pneumothorax. 2. Low volume chest with presumed atelectasis at the bases. Electronically Signed   By: Jorje Guild M.D.   On: 10/02/2022 04:26   CT HEAD WO CONTRAST  Result Date: 10/02/2022 CLINICAL DATA:  Mechanical fall EXAM: CT HEAD WITHOUT CONTRAST CT CERVICAL SPINE WITHOUT CONTRAST TECHNIQUE: Multidetector CT imaging of the head and cervical spine was performed following the standard protocol without intravenous contrast. Multiplanar CT image reconstructions of the cervical spine were also generated. RADIATION DOSE REDUCTION: This exam was performed according to the departmental dose-optimization program which includes automated exposure control, adjustment of the mA and/or kV according to patient size and/or use of iterative reconstruction technique. COMPARISON:  09/22/2022 FINDINGS: CT HEAD FINDINGS Brain: High-density subdural hematoma along the lateral left cerebellum measuring  up to 4 mm in thickness. Brain atrophy that is pronounced with confluent chronic small vessel ischemia in the deep cerebral white matter. Small vessel disease likely also involves the brainstem and deep gray nuclei. No acute cortical infarct, mass, or  hydrocephalus. Vascular: No hyperdense vessel or unexpected calcification. Skull: Swelling around the left orbit and forehead. No acute fracture Sinuses/Orbits: No visible injury CT CERVICAL SPINE FINDINGS Alignment: No traumatic malalignment Skull base and vertebrae: No acute fracture. ACDF with plate from C4 to C6. 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 Soft tissues and spinal canal: No prevertebral fluid or swelling. No visible canal hematoma. Disc levels:  Diffuse degenerative endplate spurring. Upper chest: No visible injury Critical Value/emergent results were called by telephone at the time of interpretation on 10/02/2022 at 4:21 am to provider Montine Circle , who verbally acknowledged these results. IMPRESSION: 1. Acute subdural hematoma along the lateral left cerebral convexity measuring up to 4 mm in thickness. 2. Negative for cervical spine fracture. 3. Advanced atrophy and chronic small vessel ischemia. Electronically Signed   By: Jorje Guild M.D.   On: 10/02/2022 04:22   CT CERVICAL SPINE WO CONTRAST  Result Date: 10/02/2022 CLINICAL DATA:  Mechanical fall EXAM: CT HEAD WITHOUT CONTRAST CT CERVICAL SPINE WITHOUT CONTRAST TECHNIQUE: Multidetector CT imaging of the head and cervical spine was performed following the standard protocol without intravenous contrast. Multiplanar CT image reconstructions of the cervical spine were also generated. RADIATION DOSE REDUCTION: This exam was performed according to the departmental dose-optimization program which includes automated exposure control, adjustment of the mA and/or kV according to patient size and/or use of iterative reconstruction technique. COMPARISON:  09/22/2022 FINDINGS: CT HEAD FINDINGS Brain: High-density subdural hematoma along the lateral left cerebellum measuring up to 4 mm in thickness. Brain atrophy that is pronounced with confluent chronic small vessel ischemia in the deep cerebral white matter. Small vessel disease likely also  involves the brainstem and deep gray nuclei. No acute cortical infarct, mass, or hydrocephalus. Vascular: No hyperdense vessel or unexpected calcification. Skull: Swelling around the left orbit and forehead. No acute fracture Sinuses/Orbits: No visible injury CT CERVICAL SPINE FINDINGS Alignment: No traumatic malalignment Skull base and vertebrae: No acute fracture. ACDF with plate from C4 to C6. 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 Soft tissues and spinal canal: No prevertebral fluid or swelling. No visible canal hematoma. Disc levels:  Diffuse degenerative endplate spurring. Upper chest: No visible injury Critical Value/emergent results were called by telephone at the time of interpretation on 10/02/2022 at 4:21 am to provider Montine Circle , who verbally acknowledged these results. IMPRESSION: 1. Acute subdural hematoma along the lateral left cerebral convexity measuring up to 4 mm in thickness. 2. Negative for cervical spine fracture. 3. Advanced atrophy and chronic small vessel ischemia. Electronically Signed   By: Jorje Guild M.D.   On: 10/02/2022 04:22    Pertinent items noted in HPI and remainder of comprehensive ROS otherwise negative. Blood pressure (!) 179/80, pulse 63, temperature 97.7 F (36.5 C), temperature source Oral, resp. rate 16, SpO2 100 %. Patient is awake and alert.  He is oriented and appropriate.  His speech and language are intact.  Judgment and insight appear appropriate.  Cranial nerve function intact bilaterally aside from diminished hearing.  Motor and sensory function extremities normal.  Examination of head demonstrates evidence of a left periorbital laceration which is closed with some surrounding bruising.  This is consistent with his prior injury.  I do not see any new  injury to his head.  He has left chest wall tenderness.  He is taking shallow breaths.  His abdomen is soft.  His extremities are free from injury or deformity.  Assessment/Plan: Patient status  post mechanical fall with trace left-sided subdural hematoma.  Difficult to tell whether this is related to the new fall or his prior fall 2 weeks ago.  He should remain off Eliquis for the next 2 weeks.  Plan for follow-up head CT scan today.  If this is stable the patient may be fed and mobilized.  Mallie Mussel A Theora Vankirk 10/02/2022, 11:01 AM

## 2022-10-02 NOTE — Evaluation (Signed)
Occupational Therapy Evaluation Patient Details Name: DEMONT Todd MRN: [id number] DOB: 1933/07/20 Today's Date: 10/02/2022   History of Present Illness 88 y.o. year-old male presents to the ED with chief complaint of ground-level fall. L 4-7 rib fractures with displacement, Subdural hematoma.  PMH includes: CAD, GERD, HTN, hx of falls, OSA, arthritis.   Clinical Impression   Patient admitted for the diagnosis above.  PTA he lives with his spouse at [facility].  Patient states he does not use RW for mobility, and continues to care for his own ADL.  Deficit is rib pain.  Moving very slowly and painfully, but overall Min to Mod A for bed mobility.  Patient declines standing, requested urinal.  OT to continue efforts, but for now, OT will recommend SNF for continued rehab prior to home.  If his pain is managed, he could progress, and return home.       Recommendations for follow up therapy are one component of a multi-disciplinary discharge planning process, led by the attending physician.  Recommendations may be updated based on patient status, additional functional criteria and insurance authorization.   Follow Up Recommendations  Skilled nursing-short term rehab (<3 hours/day)     Assistance Recommended at Discharge Frequent or constant Supervision/Assistance  Patient can return home with the following Assist for transportation;Assistance with cooking/housework;A lot of help with walking and/or transfers;A lot of help with bathing/dressing/bathroom    Functional Status Assessment  Patient has had a recent decline in their functional status and demonstrates the ability to make significant improvements in function in a reasonable and predictable amount of time.  Equipment Recommendations  None recommended by OT    Recommendations for Other Services       Precautions / Restrictions Precautions Precautions: Fall Restrictions Weight Bearing Restrictions: No      Mobility Bed  Mobility Overal bed mobility: Needs Assistance Bed Mobility: Rolling, Supine to Sit, Sit to Supine Rolling: Min assist   Supine to sit: Min assist Sit to supine: Min assist, Mod assist     Patient Response: Cooperative  Transfers                   General transfer comment: Declines to stand.      Balance                                           ADL either performed or assessed with clinical judgement   ADL Overall ADL's : Needs assistance/impaired     Grooming: Wash/dry hands;Wash/dry face;Set up;Bed level   Upper Body Bathing: Minimal assistance;Bed level   Lower Body Bathing: Maximal assistance;Bed level   Upper Body Dressing : Minimal assistance;Moderate assistance;Bed level   Lower Body Dressing: Maximal assistance;Bed level                       Vision Baseline Vision/History: 1 Wears glasses Patient Visual Report: No change from baseline       Perception     Praxis      Pertinent Vitals/Pain Pain Assessment Pain Assessment: Faces Faces Pain Scale: Hurts whole lot Pain Location: L ribs Pain Descriptors / Indicators: Grimacing, Guarding, Sharp Pain Intervention(s): Monitored during session     Hand Dominance Right   Extremity/Trunk Assessment Upper Extremity Assessment Upper Extremity Assessment: Overall WFL for tasks assessed   Lower Extremity Assessment Lower Extremity Assessment: Defer to  PT evaluation   Cervical / Trunk Assessment Cervical / Trunk Assessment: Other exceptions Cervical / Trunk Exceptions: Rib fractures.   Communication Communication Communication: No difficulties;HOH   Cognition Arousal/Alertness: Awake/alert Behavior During Therapy: WFL for tasks assessed/performed Overall Cognitive Status: History of cognitive impairments - at baseline Area of Impairment: Memory                     Memory: Decreased short-term memory               General Comments   VSS on RA     Exercises     Shoulder Instructions      Home Living                                   Additional Comments: ILF      Prior Functioning/Environment Prior Level of Function : Independent/Modified Independent             Mobility Comments: States he does not use RW or SPC ADLs Comments: Continues to be Ind with ADL.  No longer drives.        OT Problem List: Impaired balance (sitting and/or standing);Pain      OT Treatment/Interventions: Self-care/ADL training;Therapeutic activities;Balance training;DME and/or AE instruction    OT Goals(Current goals can be found in the care plan section) Acute Rehab OT Goals Patient Stated Goal: Return home when this pain stops OT Goal Formulation: With patient Time For Goal Achievement: 10/15/22 Potential to Achieve Goals: Good  OT Frequency: Min 2X/week    Co-evaluation              AM-PAC OT "6 Clicks" Daily Activity     Outcome Measure Help from another person eating meals?: None Help from another person taking care of personal grooming?: None Help from another person toileting, which includes using toliet, bedpan, or urinal?: A Lot Help from another person bathing (including washing, rinsing, drying)?: A Lot Help from another person to put on and taking off regular upper body clothing?: A Lot Help from another person to put on and taking off regular lower body clothing?: A Lot 6 Click Score: 16   End of Session Equipment Utilized During Treatment: Oxygen Nurse Communication: Mobility status  Activity Tolerance: Patient limited by pain Patient left: in bed;with call bell/phone within reach  OT Visit Diagnosis: Pain Pain - part of body:  (ribs)                Time: 4496-7591 OT Time Calculation (min): 21 min Charges:  OT General Charges $OT Visit: 1 Visit OT Evaluation $OT Eval Moderate Complexity: 1 Mod  10/02/2022  RP, OTR/L  Acute Rehabilitation Services  Office:   [phone]   [name] 10/02/2022, 9:52 AM

## 2022-10-03 ENCOUNTER — Inpatient Hospital Stay (HOSPITAL_COMMUNITY): Payer: Medicare Other

## 2022-10-03 LAB — CBC
HCT: 36.6 % — ABNORMAL LOW (ref 39.0–52.0)
Hemoglobin: 12.4 g/dL — ABNORMAL LOW (ref 13.0–17.0)
MCH: 34 pg (ref 26.0–34.0)
MCHC: 33.9 g/dL (ref 30.0–36.0)
MCV: 100.3 fL — ABNORMAL HIGH (ref 80.0–100.0)
Platelets: 289 10*3/uL (ref 150–400)
RBC: 3.65 MIL/uL — ABNORMAL LOW (ref 4.22–5.81)
RDW: 13.8 % (ref 11.5–15.5)
WBC: 7.2 10*3/uL (ref 4.0–10.5)
nRBC: 0 % (ref 0.0–0.2)

## 2022-10-03 LAB — BASIC METABOLIC PANEL
Anion gap: 11 (ref 5–15)
BUN: 12 mg/dL (ref 8–23)
CO2: 23 mmol/L (ref 22–32)
Calcium: 8.9 mg/dL (ref 8.9–10.3)
Chloride: 104 mmol/L (ref 98–111)
Creatinine, Ser: 0.91 mg/dL (ref 0.61–1.24)
GFR, Estimated: >60 mL/min (ref >60)
Glucose, Bld: 96 mg/dL (ref 70–99)
Potassium: 4 mmol/L (ref 3.5–5.1)
Sodium: 138 mmol/L (ref 135–145)

## 2022-10-03 MED ORDER — LEVETIRACETAM 500 MG PO TABS
500.0000 mg | ORAL_TABLET | Freq: Two times a day (BID) | ORAL | Status: DC
  Administered 2022-10-03 – 2022-10-05 (×5): 500 mg via ORAL
  Filled 2022-10-03 (×5): qty 1

## 2022-10-03 NOTE — Progress Notes (Signed)
Overall doing well.  Denies headache.  Still with left chest wall pain.  Afebrile.  Vital signs are stable.  Mildly confused but pleasant and appropriate otherwise.  Motor and sensory function intact.  Follow-up head CT scan demonstrates stable appearance of minimal left convexity subdural hematoma without any progression.  Status post fall with minimal left convexity subdural hematoma.  Okay to mobilize ad lib..  Patient should hold Eliquis for 2 weeks before restarting.  Determination of long-term need of Eliquis given the patient's high fall risk needs to be assessed by his primary doctor.

## 2022-10-03 NOTE — Progress Notes (Signed)
Patient ID: [name], male   DOB: 01-28-1933, 88 y.o.   MRN: [id number]      Subjective: C/O rib pain ROS negative except as listed above. Objective: Vital signs in last 24 hours: Temp:  [97.9 F (36.6 C)-98.8 F (37.1 C)] 97.9 F (36.6 C) (11/19 0717) Pulse Rate:  [53-68] 57 (11/19 0717) Resp:  [16-20] 19 (11/19 0717) BP: (90-127)/(40-72) 90/40 (11/19 0717) SpO2:  [90 %-98 %] 90 % (11/19 0717) Last BM Date :  (pta)  Intake/Output from previous day: No intake/output data recorded. Intake/Output this shift: No intake/output data recorded.  Head: L periorbital ecchymoses, streri strips Resp: clear to auscultation bilaterally Chest wall: left sided chest wall tenderness GI: soft, NT Extremities: calv es sfot  Lab Results: CBC  Recent Labs    10/02/22 0350 10/03/22 0310  WBC 6.3 7.2  HGB 12.4*  12.6* 12.4*  HCT 37.5*  37.0* 36.6*  PLT 333 289   BMET Recent Labs    10/02/22 0350 10/03/22 0310  NA 140  142 138  K 4.1  4.2 4.0  CL 106  106 104  CO2 23 23  GLUCOSE 116*  108* 96  BUN '13  14 12  '$ CREATININE 0.94  1.00 0.91  CALCIUM 9.1 8.9   PT/INR Recent Labs    10/02/22 0350  LABPROT 14.1  INR 1.1   ABG No results for input(s): "PHART", "HCO3" in the last 72 hours.  Invalid input(s): "PCO2", "PO2"  Studies/Results:   Anti-infectives: Anti-infectives (From admission, onward)    None       Assessment/Plan: Multiple falls L 4-7 rib fractures with displacement - pain control, pulmonary toilet, F/U CXR no PTX Subdural hematoma - eliquis reversed in ER (hold for 2 weeks), per Dr. Annette Stable, repeat CT head stable, keppra, TBI team therapies Frequent falls - PT/OT/CM consults, res SNF FEN - diet, BMET in AM VTE - PAS Dispo - therapies, plan SNF     LOS: 1 day    Georganna Skeans, MD, MPH, FACS Trauma & General Surgery Use [email or web address] to contact on call provider  10/03/2022

## 2022-10-04 ENCOUNTER — Telehealth: Payer: Self-pay | Admitting: Physician Assistant

## 2022-10-04 LAB — CBC
HCT: 33.7 % — ABNORMAL LOW (ref 39.0–52.0)
Hemoglobin: 11.3 g/dL — ABNORMAL LOW (ref 13.0–17.0)
MCH: 33 pg (ref 26.0–34.0)
MCHC: 33.5 g/dL (ref 30.0–36.0)
MCV: 98.5 fL (ref 80.0–100.0)
Platelets: 296 10*3/uL (ref 150–400)
RBC: 3.42 MIL/uL — ABNORMAL LOW (ref 4.22–5.81)
RDW: 13.6 % (ref 11.5–15.5)
WBC: 6.1 10*3/uL (ref 4.0–10.5)
nRBC: 0 % (ref 0.0–0.2)

## 2022-10-04 LAB — BASIC METABOLIC PANEL
Anion gap: 10 (ref 5–15)
BUN: 12 mg/dL (ref 8–23)
CO2: 26 mmol/L (ref 22–32)
Calcium: 8.8 mg/dL — ABNORMAL LOW (ref 8.9–10.3)
Chloride: 106 mmol/L (ref 98–111)
Creatinine, Ser: 0.95 mg/dL (ref 0.61–1.24)
GFR, Estimated: >60 mL/min (ref >60)
Glucose, Bld: 97 mg/dL (ref 70–99)
Potassium: 3.8 mmol/L (ref 3.5–5.1)
Sodium: 142 mmol/L (ref 135–145)

## 2022-10-04 MED ORDER — CALCIUM CARBONATE ANTACID 500 MG PO CHEW
200.0000 mg | CHEWABLE_TABLET | Freq: Two times a day (BID) | ORAL | Status: AC
  Administered 2022-10-04 – 2022-10-05 (×2): 200 mg via ORAL
  Filled 2022-10-04 (×2): qty 1

## 2022-10-04 NOTE — Progress Notes (Signed)
Physical Therapy Treatment Patient Details Name: [name] MRN: [id number] DOB: [birth year] Today's Date: 10/04/2022   History of Present Illness 88 y.o. year-old male presents to the ED with chief complaint of ground-level fall. L 4-7 rib fractures with displacement, Subdural hematoma.  PMH includes: CAD, GERD, HTN, hx of falls, OSA, arthritis.    PT Comments    Patient progressing well towards PT goals. Limited mainly by pain in left ribs with any movement. Requires mod A to stand from all surfaces and prefers flexed posture needing cues to obtain upright. Tolerated gait training with use of RW and min A of 2 for safety with chair follow. Sp02 dropped to mid 80s on RA during activity. Pt fatigues quickly but also with severe pain in left side. Unsure of cognitive baseline but pt with poor memory and recall throughout session, not knowing why his ribs were hurting and why he was in the hospital. Not able to recall info learned at end of session. Continue to recommend SNF. Will follow.    Recommendations for follow up therapy are one component of a multi-disciplinary discharge planning process, led by the attending physician.  Recommendations may be updated based on patient status, additional functional criteria and insurance authorization.  Follow Up Recommendations  Skilled nursing-short term rehab (<3 hours/day) Can patient physically be transported by private vehicle: Yes   Assistance Recommended at Discharge Frequent or constant Supervision/Assistance  Patient can return home with the following A little help with walking and/or transfers;A lot of help with bathing/dressing/bathroom;Assist for transportation;Assistance with cooking/housework   Equipment Recommendations  Rolling walker (2 wheels)    Recommendations for Other Services       Precautions / Restrictions Precautions Precautions: Fall Restrictions Weight Bearing Restrictions: No     Mobility  Bed Mobility Overal  bed mobility: Needs Assistance Bed Mobility: Supine to Sit           General bed mobility comments: Attempted rolling however pt unable to assist wtih RUE today, Mod A of 2 for bed mobility, assist with trunk and scooting bottom to EOB.    Transfers Overall transfer level: Needs assistance Equipment used: Rolling walker (2 wheels) Transfers: Sit to/from Stand Sit to Stand: Mod assist, +2 physical assistance, From elevated surface           General transfer comment: Stood from EOB x2 with cues for hand placement/technique and from chair x1. Flexed posture but able to stand upright with cues. limited by pain.    Ambulation/Gait Ambulation/Gait assistance: Min assist, +2 safety/equipment Gait Distance (Feet): 8 Feet (+ 16') Assistive device: Rolling walker (2 wheels) Gait Pattern/deviations: Step-through pattern, Decreased stride length, Decreased step length - right, Decreased step length - left, Trunk flexed Gait velocity: decreased Gait velocity interpretation: <1.8 ft/sec, indicate of risk for recurrent falls   General Gait Details: Slow, unsteady gait with flexed posture and short shuffling steps. Cues for RW proximity/management. 1 seated rest break. Sp02 dropped to mid 80s on RA due to inabiltiy to take deep breaths,.   Stairs             Wheelchair Mobility    Modified Rankin (Stroke Patients Only)       Balance Overall balance assessment: Needs assistance Sitting-balance support: No upper extremity supported, Feet supported Sitting balance-Leahy Scale: Good     Standing balance support: Bilateral upper extremity supported, During functional activity Standing balance-Leahy Scale: Poor Standing balance comment: reliant on external support and UE support, favors flexed trunk due  to pain in left ribs                            Cognition Arousal/Alertness: Awake/alert Behavior During Therapy: WFL for tasks assessed/performed Overall Cognitive  Status: No family/caregiver present to determine baseline cognitive functioning Area of Impairment: Memory, Orientation                 Orientation Level: Disoriented to, Place, Time, Situation   Memory: Decreased short-term memory         General Comments: Pt with poor STM, and recall within session. Thinks it is "Feb" and then "March"  Distracted by pain but able to be redirected.        Exercises      General Comments General comments (skin integrity, edema, etc.): Sp02 dropped to mid 80s on RA during session/activity.      Pertinent Vitals/Pain Pain Assessment Pain Assessment: Faces Faces Pain Scale: Hurts whole lot Pain Location: L ribs Pain Descriptors / Indicators: Grimacing, Guarding, Sharp, Discomfort, Sore Pain Intervention(s): Monitored during session, Repositioned, Patient requesting pain meds-RN notified, Limited activity within patient's tolerance    Home Living                          Prior Function            PT Goals (current goals can now be found in the care plan section) Progress towards PT goals: Progressing toward goals    Frequency    Min 3X/week      PT Plan Current plan remains appropriate    Co-evaluation              AM-PAC PT "6 Clicks" Mobility   Outcome Measure  Help needed turning from your back to your side while in a flat bed without using bedrails?: A Little Help needed moving from lying on your back to sitting on the side of a flat bed without using bedrails?: A Lot Help needed moving to and from a bed to a chair (including a wheelchair)?: A Lot Help needed standing up from a chair using your arms (e.g., wheelchair or bedside chair)?: A Lot Help needed to walk in hospital room?: Total Help needed climbing 3-5 steps with a railing? : Total 6 Click Score: 11    End of Session Equipment Utilized During Treatment: Gait belt Activity Tolerance: Patient limited by pain Patient left: in chair;with  call bell/phone within reach;with chair alarm set Nurse Communication: Mobility status PT Visit Diagnosis: Muscle weakness (generalized) (M62.81);Difficulty in walking, not elsewhere classified (R26.2);Pain Pain - Right/Left: Left Pain - part of body:  (ribs)     Time: 6720-9470 PT Time Calculation (min) (ACUTE ONLY): 24 min  Charges:  $Gait Training: 8-22 mins $Therapeutic Activity: 8-22 mins                     Marisa Severin, PT, DPT Acute Rehabilitation Services Secure chat preferred Office [place] 10/04/2022, 12:39 PM

## 2022-10-04 NOTE — TOC Initial Note (Signed)
Transition of Care [facility]) - Initial/Assessment Note    Patient Details  Name: [name] MRN: [id number] Date of Birth: Jan 29, 1933  [facility] of [facility]) CM/SW Contact:    Ella Bodo, RN Phone Number: 10/04/2022, 12:30pm  Clinical Narrative:                 88 y.o. year-old male presents to the ED with chief complaint of ground-level fall. L 4-7 rib fractures with displacement, Subdural hematoma.    Prior to admission, patient independent and living at [facility] independent living with his spouse.  PT/OT recommending skilled nursing facility for rehab.  Met with patient, wife, and son to discuss possible rehab at discharge.  Patient/family agreeable to transition to [facility] for rehab services.  Will initiate FL 2 and send initial referral to facility.  Spoke with Santiago Glad in admissions at [facility]; phone: [phone].  She states that SNF bed will be available tomorrow; patient is medically stable for discharge.  Will arrange transport in a.m. with nonemergent ambulance service.  This was all explained to patient's wife plan, and son Bevan.  Will provide updates to wife and son as needed. [name]: [phone] [place]: [phone]  Expected Discharge Plan: Skilled Nursing Facility Barriers to Discharge: Continued Medical Work up   Patient Goals and CMS Choice   CMS Medicare.gov Compare Post Acute Care list provided to:: Patient Choice offered to / list presented to : Spouse, Patient  Expected Discharge Plan and Services Expected Discharge Plan: [place]   Discharge Planning Services: CM Consult Post Acute Care Choice: [place] Living arrangements for the past 2 months: [place]                                      Prior Living Arrangements/Services Living arrangements for the past 2 months: [place] Lives with:: Spouse Patient language and need for interpreter reviewed:: Yes Do you feel  safe going back to the place where you live?: Yes      Need for Family Participation in Patient Care: Yes (Comment) Care giver support system in place?: Yes (comment)   Criminal Activity/Legal Involvement Pertinent to Current Situation/Hospitalization: No - Comment as needed  Activities of Daily Living      Permission Sought/Granted Permission sought to share information with : Family Supports Permission granted to share information with : Yes, Verbal Permission Granted  Share Information with NAME: Russel Morain     Permission granted to share info w Relationship: wife  Permission granted to share info w Contact Information: [phone]  Emotional Assessment Appearance:: Appears stated age Attitude/Demeanor/Rapport: Engaged Affect (typically observed): Accepting Orientation: : Oriented to Self, Oriented to Place      Admission diagnosis:  Subdural hematoma ([place]) [S06.5XAA] Fall, initial encounter [id number]] Closed fracture of multiple ribs of left side, initial encounter [S22.42XA] Patient Active Problem List   Diagnosis Date Noted   Subdural hematoma ([place]) 10/02/2022   Accidental fall 07/29/2022   DDD (degenerative disc disease), cervical 01/19/2022   Knee pain, left 01/19/2022   Dizzinesses 12/02/2021   Atherosclerosis of aorta ([place]) 03/02/2021   COVID-19 virus infection 11/15/2020   Weight loss 09/29/2020   Near syncope 09/04/2020   Chest pain    Dyspepsia    Encounter for medication management 11/14/2019   Angular stomatitis 07/09/2019   Cervical radiculopathy 06/05/2019   Pain in joint of left shoulder 05/31/2019  Concussion 04/17/2019   Hemoptysis 04/05/2019   Confusion 04/05/2019   Alzheimer's dementia ([place]) 12/21/2018   Nausea & vomiting 02/07/2018   Chills 02/07/2018   Rash 02/07/2018   Insomnia 09/22/2017   Cervical spondylolysis 06/28/2017   Hyperglycemia 06/22/2017   Well adult exam 11/18/2016   Hydronephrosis 11/18/2016   UPJ obstruction,  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   Allergic rhinitis 06/02/2016   Asthma with exacerbation 06/02/2016   Sinusitis, chronic 10/17/2015   OSA (obstructive sleep apnea) 06/19/2015   Cellulitis and abscess of leg, except foot 05/26/2015   Paroxysmal atrial fibrillation ([place]) 02/21/2015   CAP (community acquired pneumonia) 01/02/2015   Upper respiratory infection 12/02/2014   Vivid dream 09/25/2014   Actinic keratoses 04/26/2014   CAD (coronary artery disease) 02/25/2014   STEMI (ST elevation myocardial infarction) ([place]) 01/08/2014   Rheumatoid bursitis of left elbow ([place]) 08/08/2013   Situational depression 06/18/2013   Neck pain 04/23/2012   Acute abdominal pain in left flank 02/14/2012   Hypertension 09/23/2011   Chest pain, midsternal 09/13/2011   Mixed hyperlipidemia 07/23/2010   Palpitations 07/07/2010   Rheumatoid arthritis ([place]) 06/04/2010   PARESTHESIA 06/04/2010   Pain in joint 11/27/2009   MYALGIA 08/21/2009   ALLERGIC RHINITIS 06/11/2008   OSTEOARTHRITIS 06/11/2008   FATIGUE 06/11/2008   LOW BACK PAIN 07/27/2007   COLONIC POLYPS, HX OF 07/27/2007   PCP:  Cassandria Anger, MD Pharmacy:   [facility] - [place], [place] - [address] [place] [address] Phone: [phone] Fax: [place], [place] - [address] [place] [address] Phone: [phone] Fax: [phone]  [place], [place] - [address] [place] [address] Phone: [phone] Fax: [phone]     Social Determinants of Health (SDOH) Interventions    Readmission Risk Interventions     No data to display         Reinaldo Raddle, RN, BSN  Trauma/Neuro ICU Case Manager [phone]

## 2022-10-04 NOTE — Progress Notes (Signed)
Progress Note     Subjective: Pt mildly confused this AM but able to carry on a conversation. He reports he lives at [place] with his wife. They have 2 adult children as well. He reports some pain in left chest from rib fractures. Denies abdominal pain or nausea. Reports he is passing flatus, unsure when last BM was. Has an external catheter.   Objective: Vital signs in last 24 hours: Temp:  [97.6 F (36.4 C)-98.5 F (36.9 C)] 98.4 F (36.9 C) (11/20 0832) Pulse Rate:  [56-64] 64 (11/20 0832) Resp:  [15-18] 15 (11/20 0832) BP: (93-137)/(52-70) 93/55 (11/20 0832) SpO2:  [92 %-96 %] 92 % (11/20 0832) Last BM Date :  (pta)  Intake/Output from previous day: 11/19 0701 - 11/20 0700 In: 840 [P.O.:840] Out: 2100 [Urine:2100] Intake/Output this shift: No intake/output data recorded.  PE: General: pleasant, WD, elderly male who is laying in bed in NAD HEENT: laceration to left lateral brow with steri-strips present, some facial ecchymosis, EOMI Heart: regular, rate, and rhythm. Palpable radial and pedal pulses bilaterally Lungs: CTAB, no wheezes, rhonchi, or rales noted.  Respiratory effort nonlabored, pulled 1500 on IS Abd: soft, NT, ND, +BS, no masses, hernias, or organomegaly MS: all 4 extremities are symmetrical with no cyanosis, clubbing, or edema. Skin: warm and dry with no masses, lesions, or rashes Neuro: non-focal, follows commands Psych: A&Ox3 with an appropriate affect.    Lab Results:  Recent Labs    10/03/22 0310 10/04/22 0552  WBC 7.2 6.1  HGB 12.4* 11.3*  HCT 36.6* 33.7*  PLT 289 296   BMET Recent Labs    10/03/22 0310 10/04/22 0552  NA 138 142  K 4.0 3.8  CL 104 106  CO2 23 26  GLUCOSE 96 97  BUN 12 12  CREATININE 0.91 0.95  CALCIUM 8.9 8.8*   PT/INR Recent Labs    10/02/22 0350  LABPROT 14.1  INR 1.1   CMP     Component Value Date/Time   NA 142 10/04/2022 0552   NA 141 09/24/2022 0000   K 3.8 10/04/2022 0552   CL 106 10/04/2022  0552   CO2 26 10/04/2022 0552   GLUCOSE 97 10/04/2022 0552   BUN 12 10/04/2022 0552   BUN 12 09/24/2022 0000   CREATININE 0.95 10/04/2022 0552   CREATININE 0.74 02/23/2016 0909   CALCIUM 8.8 (L) 10/04/2022 0552   PROT 7.0 10/02/2022 0350   PROT 7.0 03/03/2020 1036   ALBUMIN 4.0 10/02/2022 0350   ALBUMIN 4.0 03/03/2020 1036   AST 24 10/02/2022 0350   ALT 16 10/02/2022 0350   ALKPHOS 61 10/02/2022 0350   BILITOT 0.6 10/02/2022 0350   BILITOT 0.4 03/03/2020 1036   GFRNONAA >60 10/04/2022 0552   GFRAA >60 03/14/2020 1056   Lipase     Component Value Date/Time   LIPASE 40 08/31/2020 1544       Studies/Results: DG CHEST PORT 1 VIEW  Result Date: 10/03/2022 CLINICAL DATA:  Follow-up rib fracture EXAM: PORTABLE CHEST 1 VIEW COMPARISON:  CT chest dated 10/02/2022 FINDINGS: Low lung volumes. Bibasilar patchy opacities. Unchanged blunting of the left costophrenic angle. No pneumothorax. Similar cardiomediastinal silhouette. Again seen are displaced left posterior fourth through sixth rib fractures. Nondisplaced seventh rib fracture is not well seen. Partially imaged cervical fixation hardware. IMPRESSION: Unchanged displaced left posterior fourth through sixth rib fractures. Nondisplaced seventh rib fracture is not well seen. No pneumothorax. Electronically Signed   By: Darrin Nipper M.D.   On:  10/03/2022 08:14   CT HEAD WO CONTRAST (5MM)  Result Date: 10/02/2022 CLINICAL DATA:  Follow-up subdural hemorrhage. EXAM: CT HEAD WITHOUT CONTRAST TECHNIQUE: Contiguous axial images were obtained from the base of the skull through the vertex without intravenous contrast. RADIATION DOSE REDUCTION: This exam was performed according to the departmental dose-optimization program which includes automated exposure control, adjustment of the mA and/or kV according to patient size and/or use of iterative reconstruction technique. COMPARISON:  CT scan of brain October 02, 2022 at 4:03 a.m. FINDINGS: Brain: The  left-sided subdural hematoma remains, similar in size in the interval. The hematoma measures up to 4 mm in thickness on series 4, image 24. Posteriorly, there is also left subdural blood on series 4, image 18 measuring 5 mm in thickness on today's study versus 5 mm earlier today. No significant change in the left-sided subdural hematoma to. No other abnormal intracranial blood. No midline shift. Ventricles and sulci are prominent but stable. No blood in the ventricles. Cerebellum, brainstem, and basal cisterns are normal. Significant white matter changes remain. No acute cortical ischemia or infarct. Vascular: No hyperdense vessel or unexpected calcification. Skull: Normal. Negative for fracture or focal lesion. Sinuses/Orbits: No acute finding. Other: Soft tissue swelling in the left periorbital region. IMPRESSION: 1. The left-sided subdural hematoma is stable in the interval. No midline shift. 2. Soft tissue swelling in the left periorbital region. 3. No other acute intracranial abnormalities. Electronically Signed   By: Dorise Bullion III M.D.   On: 10/02/2022 17:34    Anti-infectives: Anti-infectives (From admission, onward)    None        Assessment/Plan Multiple falls L 4-7 rib fractures with displacement - pain control, pulmonary toilet, F/U CXR no PTX Subdural hematoma - eliquis reversed in ER (hold for 2 weeks), per Dr. Annette Stable, repeat CT head stable, keppra, TBI team therapies Frequent falls - PT/OT/CM consults, res SNF  FEN - reg diet, replace calcium PO VTE - PAS ID - no current abx, afeb, no leukocytosis   Dispo - therapies, plan SNF - pt living at [facility]. Patient is medically stable for discharge   LOS: 2 days    Norm Parcel, [facility] Surgery 10/04/2022, 11:04 AM Please see Amion for pager number during day hours 7:00am-4:30pm

## 2022-10-04 NOTE — Telephone Encounter (Signed)
Unsure whom called, asking office staff.

## 2022-10-04 NOTE — Telephone Encounter (Signed)
Pt's wife called in stating someone just called her, but they didn't leave a message. She stated the pt is currently in the hospital.

## 2022-10-04 NOTE — NC FL2 (Signed)
[place] LEVEL OF CARE SCREENING TOOL     IDENTIFICATION  Patient Name: [name] Birthdate: Jun 30, 1933 Sex: male Admission Date (Current Location): 10/02/2022  [facility] and [place] Number:  Herbalist and Address:  [facility], [place] [address], [place], [place] [address]      Provider Number: [id number]  Attending Physician Name and Address:  Reather Laurence, MD [phone], [place] Relative Name and Phone Number:  Haden Suder, wife: [phone]    Current Level of Care: Hospital Recommended Level of Care: [place] Prior Approval Number:    Date Approved/Denied:   PASRR Number: [id number]  Discharge Plan: SNF    Current Diagnoses: Patient Active Problem List   Diagnosis Date Noted   Subdural hematoma ([place]) 10/02/2022   Accidental fall 07/29/2022   DDD (degenerative disc disease), cervical 01/19/2022   Knee pain, left 01/19/2022   Dizzinesses 12/02/2021   Atherosclerosis of aorta ([place]) 03/02/2021   COVID-19 virus infection 11/15/2020   Weight loss 09/29/2020   Near syncope 09/04/2020   Chest pain    Dyspepsia    Encounter for medication management 11/14/2019   Angular stomatitis 07/09/2019   Cervical radiculopathy 06/05/2019   Pain in joint of left shoulder 05/31/2019   Concussion 04/17/2019   Hemoptysis 04/05/2019   Confusion 04/05/2019   Alzheimer's dementia ([place]) 12/21/2018   Nausea & vomiting 02/07/2018   Chills 02/07/2018   Rash 02/07/2018   Insomnia 09/22/2017   Cervical spondylolysis 06/28/2017   Hyperglycemia 06/22/2017   Well adult exam 11/18/2016   Hydronephrosis 11/18/2016   UPJ obstruction, 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   Allergic rhinitis 06/02/2016   Asthma with exacerbation 06/02/2016   Sinusitis, chronic 10/17/2015   OSA (obstructive sleep apnea) 06/19/2015   Cellulitis and abscess of leg, except foot 05/26/2015   Paroxysmal atrial fibrillation ([place])  02/21/2015   CAP (community acquired pneumonia) 01/02/2015   Upper respiratory infection 12/02/2014   Vivid dream 09/25/2014   Actinic keratoses 04/26/2014   CAD (coronary artery disease) 02/25/2014   STEMI (ST elevation myocardial infarction) ([place]) 01/08/2014   Rheumatoid bursitis of left elbow ([place]) 08/08/2013   Situational depression 06/18/2013   Neck pain 04/23/2012   Acute abdominal pain in left flank 02/14/2012   Hypertension 09/23/2011   Chest pain, midsternal 09/13/2011   Mixed hyperlipidemia 07/23/2010   Palpitations 07/07/2010   Rheumatoid arthritis ([place]) 06/04/2010   PARESTHESIA 06/04/2010   Pain in joint 11/27/2009   MYALGIA 08/21/2009   ALLERGIC RHINITIS 06/11/2008   OSTEOARTHRITIS 06/11/2008   FATIGUE 06/11/2008   LOW BACK PAIN 07/27/2007   COLONIC POLYPS, HX OF 07/27/2007    Orientation RESPIRATION BLADDER Height & Weight     Self, Place  Normal External catheter Weight:   Height:     BEHAVIORAL SYMPTOMS/MOOD NEUROLOGICAL BOWEL NUTRITION STATUS      Continent Diet (regular, thin liquids)  AMBULATORY STATUS COMMUNICATION OF NEEDS Skin   Limited Assist Verbally Bruising (face, leg)                       Personal Care Assistance Level of Assistance  Feeding, Dressing Bathing Assistance: Limited assistance Feeding assistance: Limited assistance Dressing Assistance: Limited assistance     Functional Limitations Info  Hearing   Hearing Info: Adequate      SPECIAL CARE FACTORS FREQUENCY  PT (By licensed PT), OT (By licensed OT)     PT Frequency: 5x weekly OT Frequency:  5x weekly            Contractures Contractures Info: Not present    Additional Factors Info  Code Status, Allergies Code Status Info: Full code Allergies Info: Methotrexate-tachycardia; Aricept- Nightmares; Crestor-Myalgias with '20mg'$  dose; Calcium-not specified; Lisinopril-cough; Namenda-n/v/d; Atrovastatin-muscle pain, leg cramps; Pravastatin sodium- cramps, fatigue            Current Medications (10/04/2022):  This is the current hospital active medication list Current Facility-Administered Medications  Medication Dose Route Frequency Provider Last Rate Last Admin   acetaminophen (TYLENOL) tablet 650 mg  650 mg Oral Q6H Stechschulte, Nickola Major, MD   650 mg at 10/04/22 1145   calcium carbonate (TUMS - dosed in mg elemental calcium) chewable tablet 200 mg of elemental calcium  200 mg of elemental calcium Oral BID WC Barkley Boards R, PA-C       docusate sodium (COLACE) capsule 100 mg  100 mg Oral BID Stechschulte, Nickola Major, MD   100 mg at 10/04/22 0834   donepezil (ARICEPT) tablet 5 mg  5 mg Oral QHS Stechschulte, Nickola Major, MD   5 mg at 10/03/22 2137   escitalopram (LEXAPRO) tablet 20 mg  20 mg Oral Daily Stechschulte, Nickola Major, MD   20 mg at 10/04/22 4008   ezetimibe (ZETIA) tablet 5 mg  5 mg Oral Daily Stechschulte, Nickola Major, MD   5 mg at 10/04/22 6761   gabapentin (NEURONTIN) capsule 300 mg  300 mg Oral TID Stechschulte, Nickola Major, MD   300 mg at 10/04/22 9509   HYDROmorphone (DILAUDID) injection 0.5 mg  0.5 mg Intravenous Q3H PRN Stechschulte, Nickola Major, MD       levETIRAcetam (KEPPRA) tablet 500 mg  500 mg Oral BID Georganna Skeans, MD   500 mg at 10/04/22 3267   LORazepam (ATIVAN) tablet 0.5 mg  0.5 mg Oral BID PRN Stechschulte, Nickola Major, MD   0.5 mg at 10/03/22 2138   memantine (NAMENDA) tablet 10 mg  10 mg Oral QHS Stechschulte, Nickola Major, MD   10 mg at 10/03/22 2137   methocarbamol (ROBAXIN) 500 mg in dextrose 5 % 50 mL IVPB  500 mg Intravenous Q6H PRN Stechschulte, Nickola Major, MD       metoprolol tartrate (LOPRESSOR) tablet 25 mg  25 mg Oral BID Stechschulte, Nickola Major, MD   25 mg at 10/04/22 0834   OLANZapine (ZYPREXA) tablet 2.5 mg  2.5 mg Oral QHS Stechschulte, Nickola Major, MD   2.5 mg at 10/03/22 2138   ondansetron (ZOFRAN) injection 4 mg  4 mg Intravenous Q6H PRN Stechschulte, Nickola Major, MD       oxyCODONE (Oxy IR/ROXICODONE) immediate release tablet 10 mg  10 mg Oral Q4H PRN  Stechschulte, Nickola Major, MD   10 mg at 10/03/22 0215   oxyCODONE (Oxy IR/ROXICODONE) immediate release tablet 5 mg  5 mg Oral Q4H PRN Stechschulte, Nickola Major, MD   5 mg at 10/04/22 1145   pantoprazole (PROTONIX) EC tablet 40 mg  40 mg Oral Daily Stechschulte, Nickola Major, MD   40 mg at 10/04/22 1245   prochlorperazine (COMPAZINE) injection 10 mg  10 mg Intravenous Q4H PRN Stechschulte, Nickola Major, MD       rosuvastatin (CRESTOR) tablet 5 mg  5 mg Oral Daily Stechschulte, Nickola Major, MD   5 mg at 10/04/22 0834   simethicone (MYLICON) chewable tablet 80 mg  80 mg Oral QID PRN Stechschulte, Nickola Major, MD         Discharge Medications:  Please see discharge summary for a list of discharge medications.  Relevant Imaging Results:  Relevant Lab Results:   Additional Information SS#: [id number]  Reinaldo Raddle, RN, BSN  Trauma/Neuro ICU Case Manager [phone]

## 2022-10-04 NOTE — Discharge Summary (Incomplete)
Physician Discharge Summary  Patient ID: [name] MRN: [id number] DOB/AGE: 03/02/1933 88 y.o.  Admit date: 10/02/2022 Discharge date: 10/05/2022  Discharge Diagnoses Frequent falls Left 4-7 rib fractures with displacement SDH Anticoagulated on Eliquis, s/p reversal   Consultants Neurosurgery  Procedures None   HPI: Patient presented as a level 2 trauma activation s/p GLF. [place] multiple times on day of admission. Reportedly also fell 10 days ago and was evaluated in the ED. On Eliquis, s/p reversal in ED. Found to have left sided rib fractures and SDH. Patient was admitted to progressive care unit.   Hospital Course: Neurosurgery consulted and recommended repeat CTH and hold Eliquis for minimum of 2 weeks. Follow up [place] stable. Follow up CXR was stable without PTX. Patient was evaluated by TBI team therapies who recommended SNF rehab upon medical readiness for discharge. Patient is a resident of [facility] and will discharge to rehab there.   On 10/05/22 patient was tolerating a diet, voiding appropriately, mobilizing well, pain well controlled, VSS and overall felt stable for discharge to SNF. Given frequent falls would recommend close follow up with PCP and strong consideration for not resuming Eliquis.   PE: General: pleasant, WD, elderly male who is laying in bed in NAD HEENT: laceration to left lateral brow with steri-strips present, some facial ecchymosis, EOMI Heart: regular, rate, and rhythm. Palpable radial and pedal pulses bilaterally Lungs: CTAB, no wheezes, rhonchi, or rales noted.  Respiratory effort nonlabored Abd: soft, NT, ND, +BS, no masses, hernias, or organomegaly MS: all 4 extremities are symmetrical with no cyanosis, clubbing, or edema. Skin: warm and dry with no masses, lesions, or rashes Neuro: non-focal, follows commands   I or a member of my team have reviewed this patient in the Controlled Substance Database   Allergies as of 10/05/2022        Reactions   Methotrexate Other (See Comments)   REACTION: tachycardia   Aricept [donepezil Hcl]    nightmares    Crestor [rosuvastatin Calcium]    Myalgias with '20mg'$  dose   Lisinopril Other (See Comments)   cough   Namenda [memantine]    n/d   Atorvastatin Other (See Comments)   Muscle pain, leg cramps   Pravastatin Sodium Other (See Comments)   REACTION: cramps, fatigue        Medication List     STOP taking these medications    Ambien 10 MG tablet Generic drug: zolpidem   Eliquis 5 MG Tabs tablet Generic drug: apixaban   LORazepam 0.5 MG tablet Commonly known as: ATIVAN       TAKE these medications    acetaminophen 500 MG tablet Commonly known as: TYLENOL Take 1,000 mg by mouth every 6 (six) hours as needed.   aspirin EC 81 MG tablet Take 81 mg by mouth daily.   docusate sodium 100 MG capsule Commonly known as: COLACE Take 1 capsule (100 mg total) by mouth 2 (two) times daily.   donepezil 5 MG tablet Commonly known as: ARICEPT Take 5 mg by mouth at bedtime.   escitalopram 20 MG tablet Commonly known as: LEXAPRO TAKE ONE TABLET BY MOUTH ONCE DAILY What changed:  how much to take how to take this when to take this additional instructions   ezetimibe 10 MG tablet Commonly known as: ZETIA TAKE 1/2 TABLET BY MOUTH ONCE DAILY   gabapentin 300 MG capsule Commonly known as: NEURONTIN Take 1 capsule (300 mg total) by mouth 3 (three) times daily.   levETIRAcetam 500 MG  tablet Commonly known as: KEPPRA Take 1 tablet (500 mg total) by mouth 2 (two) times daily for 5 days.   memantine 10 MG tablet Commonly known as: NAMENDA Take 10 mg by mouth at bedtime.   metoprolol tartrate 25 MG tablet Commonly known as: LOPRESSOR TAKE ONE TABLET BY MOUTH EVERYDAY AT BEDTIME What changed: See the new instructions.   nitroGLYCERIN 0.4 MG SL tablet Commonly known as: NITROSTAT ONE TABLET UNDER TONGUE AS NEEDED FOR CHEST PAIN. MAY REPEAT IN 5 MINUTES AND  AGAIN IN 5 MINUTES (TOTAL OF 3 TABLETS) What changed: See the new instructions.   OLANZapine 2.5 MG tablet Commonly known as: ZYPREXA Take 2.5 mg by mouth at bedtime.   omeprazole 40 MG capsule Commonly known as: PRILOSEC TAKE ONE CAPSULE BY MOUTH ONCE DAILY What changed:  how much to take how to take this when to take this additional instructions   Orencia 250 MG injection Generic drug: abatacept every 30 (thirty) days.   oxyCODONE 5 MG immediate release tablet Commonly known as: Oxy IR/ROXICODONE Take 1 tablet (5 mg total) by mouth every 6 (six) hours as needed for moderate pain.   Repatha SureClick 502 MG/ML [place] Generic drug: Evolocumab INJECT 1 PEN INTO SKIN EVERY 14 DAYS   rosuvastatin 5 MG tablet Commonly known as: CRESTOR TAKE ONE TABLET BY MOUTH ONCE DAILY   triamcinolone cream 0.1 % Commonly known as: KENALOG Apply 1 Application topically 2 (two) times daily. X 7 days          Contact information for follow-up providers     Plotnikov, Evie Lacks, MD. Schedule an appointment as soon as possible for a visit.   Specialty: Internal Medicine Why: To work up frequent falls and further management of pain from rib fractures. Contact information: [place] [address] [phone]         [place]. Call.   Why: As needed with questions regarding recent hospitalization. No follow up scheduled. Contact information: [place] [address] [phone]        Earnie Larsson, MD. Call.   Specialty: Neurosurgery Why: As needed for questions regarding TBI, no specific follow up scheduled or needed at this time. Contact information: [address] [place] [address] [phone]              Contact information for after-discharge care     Destination     [facility] .   Service: Skilled Nursing Contact  information: [place] [phone]                     Signed: TAJAY MUZZY , [facility] Surgery 10/05/2022, 8:35 AM Please see Amion for pager number during day hours 7:00am-4:30pm

## 2022-10-05 ENCOUNTER — Non-Acute Institutional Stay (SKILLED_NURSING_FACILITY): Payer: Medicare Other | Admitting: Orthopedic Surgery

## 2022-10-05 ENCOUNTER — Encounter: Payer: Self-pay | Admitting: Orthopedic Surgery

## 2022-10-05 DIAGNOSIS — M069 Rheumatoid arthritis, unspecified: Secondary | ICD-10-CM | POA: Diagnosis not present

## 2022-10-05 DIAGNOSIS — G4733 Obstructive sleep apnea (adult) (pediatric): Secondary | ICD-10-CM

## 2022-10-05 DIAGNOSIS — S2242XA Multiple fractures of ribs, left side, initial encounter for closed fracture: Secondary | ICD-10-CM | POA: Diagnosis not present

## 2022-10-05 DIAGNOSIS — S0181XD Laceration without foreign body of other part of head, subsequent encounter: Secondary | ICD-10-CM

## 2022-10-05 DIAGNOSIS — G308 Other Alzheimer's disease: Secondary | ICD-10-CM

## 2022-10-05 DIAGNOSIS — S065XAA Traumatic subdural hemorrhage with loss of consciousness status unknown, initial encounter: Secondary | ICD-10-CM

## 2022-10-05 DIAGNOSIS — F02B3 Dementia in other diseases classified elsewhere, moderate, with mood disturbance: Secondary | ICD-10-CM | POA: Diagnosis not present

## 2022-10-05 DIAGNOSIS — E782 Mixed hyperlipidemia: Secondary | ICD-10-CM

## 2022-10-05 DIAGNOSIS — I10 Essential (primary) hypertension: Secondary | ICD-10-CM

## 2022-10-05 DIAGNOSIS — I480 Paroxysmal atrial fibrillation: Secondary | ICD-10-CM | POA: Diagnosis not present

## 2022-10-05 DIAGNOSIS — I2510 Atherosclerotic heart disease of native coronary artery without angina pectoris: Secondary | ICD-10-CM

## 2022-10-05 LAB — CBC
HCT: 32.2 % — ABNORMAL LOW (ref 39.0–52.0)
Hemoglobin: 10.8 g/dL — ABNORMAL LOW (ref 13.0–17.0)
MCH: 33.1 pg (ref 26.0–34.0)
MCHC: 33.5 g/dL (ref 30.0–36.0)
MCV: 98.8 fL (ref 80.0–100.0)
Platelets: 277 10*3/uL (ref 150–400)
RBC: 3.26 MIL/uL — ABNORMAL LOW (ref 4.22–5.81)
RDW: 13.5 % (ref 11.5–15.5)
WBC: 6.4 10*3/uL (ref 4.0–10.5)
nRBC: 0 % (ref 0.0–0.2)

## 2022-10-05 LAB — BASIC METABOLIC PANEL
Anion gap: 9 (ref 5–15)
BUN: 14 mg/dL (ref 8–23)
CO2: 23 mmol/L (ref 22–32)
Calcium: 8.7 mg/dL — ABNORMAL LOW (ref 8.9–10.3)
Chloride: 107 mmol/L (ref 98–111)
Creatinine, Ser: 0.93 mg/dL (ref 0.61–1.24)
GFR, Estimated: >60 mL/min (ref >60)
Glucose, Bld: 109 mg/dL — ABNORMAL HIGH (ref 70–99)
Potassium: 3.8 mmol/L (ref 3.5–5.1)
Sodium: 139 mmol/L (ref 135–145)

## 2022-10-05 MED ORDER — DOCUSATE SODIUM 100 MG PO CAPS: 100.0000 mg | ORAL_CAPSULE | Freq: Two times a day (BID) | ORAL | 0 refills | Status: DC

## 2022-10-05 MED ORDER — GABAPENTIN 300 MG PO CAPS: 300.0000 mg | ORAL_CAPSULE | Freq: Three times a day (TID) | ORAL | Status: DC

## 2022-10-05 MED ORDER — OXYCODONE HCL 5 MG PO TABS: 5.0000 mg | ORAL_TABLET | Freq: Four times a day (QID) | ORAL | 0 refills | Status: DC | PRN

## 2022-10-05 MED ORDER — LEVETIRACETAM 500 MG PO TABS: 500.0000 mg | ORAL_TABLET | Freq: Two times a day (BID) | ORAL | 0 refills | Status: DC

## 2022-10-05 NOTE — Progress Notes (Addendum)
Location:  [facility] Room Number: 156/A Place of Service:  SNF (31) Provider:  Windell Moulding, NP    Patient Care Team: Cassandria Anger, MD as PCP - General Sueanne Margarita, MD as PCP - Cardiology (Cardiology) Thompson Grayer, MD as PCP - Electrophysiology (Cardiology) Irene Shipper, MD (Gastroenterology) Thompson Grayer, MD (Cardiology) Hennie Duos, MD as Consulting Physician (Rheumatology) Alexis Frock, MD as Consulting Physician (Urology) Katy Apo, MD as Consulting Physician (Ophthalmology) Mathis Fare (Dentistry) Tiajuana Amass, MD as Referring Physician (Allergy and Immunology) Cameron Sprang, MD as Consulting Physician (Neurology) Charlton Haws, [facility] as Pharmacist (Pharmacist) Cameron Sprang, MD as Consulting Physician (Neurology)  Extended Emergency Contact Information Primary Emergency Contact: Hillier,[name] Address: [place], [place] Phone: [phone] Work Phone: [phone] Mobile Phone: [phone] Relation: Spouse Secondary Emergency Contact: Garden Mobile Phone: [phone] Relation: Daughter  Code Status:  DNR Goals of care: Advanced Directive information    10/05/2022    4:19 PM  Advanced Directives  Does Patient Have a Medical Advance Directive? Yes  Type of Paramedic of [place];Living will  Does patient want to make changes to medical advance directive? No - Patient declined  Copy of [place] in Chart? Yes - validated most recent copy scanned in chart (See row information)     Chief Complaint  Patient presents with   Hospitalization Follow-up    Follow-up from recent hospital visit    Quality Metric Gaps    To Discuss the following as listed to the Care Gaps as listed below.     HPI:  Pt is a 88 y.o. male seen today for a hospital f/u s/p admission from [facility].   He currently  resides on the rehabilitation unit at [facility]. PMH: atherosclerosis, CAD, HTN, HLD, PAF, STEMI, OSA, Alzheimer's, DDD, OA/RA, and insomnia.   11/08 he fell at home and hit his head. He had a 6 cm laceration to his forehead. He takes Eliquis for PAF. He presented to the ED for evaluation. CT head noted scalp soft tissue injury without underlying skull fracture. CT spine negative for C-spine fracture. CXR unremarkable. He was thought to have mild concussion exacerbated by dementia. 3 dissolving sutures were placed over facial laceration. He was discharged back to [place].   11/18 he had multiple falls. CT head revealed acute subdural hematoma along lateral left cerebral convexity measuring 4 mm, no spine fracture. CT chest revealed acute rib fractures to posterior left 4th/5th/6th /7th. He was admitted to progressive care unit at [facility]. Neurosurgery consulted- recommended repeat CTH and hold Eliquis for minimum of 2 weeks. He was admitted to [facility] re[facility] at discharge.   Today, he is alert and confused. He does not remember hospitalization. He asks for his wife many times during our encounter. He denies generalized pain, headaches or N/V. He was in memory care for a few days after 11/08 fall but was then discharged home. He was noted to have increased agitation/restlessness in memory care and given ativan and Zyprexa.     Past Medical History:  Diagnosis Date   CAD (coronary artery disease)    a. s/p inferior STEMI 01/08/2014; LHC 01/08/14: total RCA occlusion s/p 3.5x59m Xience DES distal RCA and 3.5x28 mm DES mid RCA, 60-70% mid LAD stenosis, EF 55%.   Complication of anesthesia    'long to wake up after back surgery "  09/2003   Diverticulosis of colon    GERD (gastroesophageal reflux disease)    History of cellulitis    05-26-2015  LLE   History of colon polyps    1998- benign/  2008 adenomatous    History of kidney stones    2013   History of squamous cell carcinoma excision     2013;  2015;  06-12-2015 right leg/  02/ and 05/ 2017  left ear and left leg   Hydronephrosis, left    Hyperlipidemia    Hypertension    Migraine with aura    OSA on CPAP 06/19/2015   Moderate OSA with AHI 18/hr  per study 05-20-2015   Osteoarthritis    Paroxysmal atrial fibrillation ([place]) 4/16   chads2vasc score is at least 4   Premature atrial contractions    RA (rheumatoid arthritis) [facility])    rheumatologist-  dr Leigh Aurora   Sinusitis, chronic 10/17/2015   Wears glasses    Wears hearing aid    bilateral   Past Surgical History:  Procedure Laterality Date   BACK SURGERY     CARDIOVASCULAR STRESS TEST  11/21/2015   Low risk nuclear study w/ a small diaphragmatic attenuation artifact, no ischemia/  normal LV function and wall motion , ef 63%   CATARACT EXTRACTION W/ INTRAOCULAR LENS  IMPLANT, BILATERAL  2015   COLONOSCOPY  last one 06-09-2011   CORONARY ANGIOPLASTY     CYSTOSCOPY W/ RETROGRADES Left 07/12/2016   Procedure: CYSTOSCOPY WITH RETROGRADE PYELOGRAM LEFT URETERAL STENT;  Surgeon: Irine Seal, MD;  Location: [facility];  Service: Urology;  Laterality: Left;   CYSTOSCOPY WITH RETROGRADE PYELOGRAM, URETEROSCOPY AND STENT PLACEMENT Left 08/11/2016   Procedure: CYSTOSCOPY WITH RETROGRADE PYELOGRAM,  DIAGNOSTIC URETEROSCOPY , STENT EXCHANGE;  Surgeon: Alexis Frock, MD;  Location: [facility];  Service: Urology;  Laterality: Left;   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   severe fibromatosis palm and fingers   LEFT HEART CATHETERIZATION WITH CORONARY ANGIOGRAM N/A 01/08/2014   Procedure: LEFT HEART CATHETERIZATION WITH CORONARY ANGIOGRAM;  Surgeon: Troy Sine, MD;  Location: [facility] CATH LAB;  Service: Cardiovascular;  Laterality:N/A;  total/ subtotal RCA/  mLAD 16-10% w/ mid systolic bridging/  preserved global LVF, ef 55%   MOHS SURGERY  x2  feb and may 2017   left ear /  left leg  ([facility])   PERCUTANEOUS CORONARY STENT INTERVENTION (PCI-S)  01/08/2014    Procedure: PERCUTANEOUS CORONARY STENT INTERVENTION (PCI-S);  Surgeon: Troy Sine, MD;  Location: [facility] CATH LAB;  Service: Cardiovascular;;  DES to mid and distal RCA   POSTERIOR LUMBAR FUSION  10/01/2003   and Laminectomy/ diskectomy  L4 -- S1   ROBOT ASSISTED PYELOPLASTY Left 09/15/2016   Procedure: XI ROBOTIC ASSISTED PYELOPLASTY;  Surgeon: Alexis Frock, MD;  Location: [facility];  Service: Urology;  Laterality: Left;   TONSILLECTOMY     TRANSTHORACIC ECHOCARDIOGRAM  02/23/2016   mild LVH,  ef 50-55%,  grade 1 diastolic dysfunction/  mild to moderate AV calcification w/ no stenosis or regurg./  trivial MR and TR/  mild PR    Allergies  Allergen Reactions   Methotrexate Other (See Comments)    REACTION: tachycardia   Aricept [Donepezil Hcl]     nightmares    Crestor [Rosuvastatin Calcium]     Myalgias with '20mg'$  dose   Lisinopril Other (See Comments)    cough   Namenda [Memantine]     n/d   Atorvastatin Other (See Comments)  Muscle pain, leg cramps   Pravastatin Sodium Other (See Comments)    REACTION: cramps, fatigue    Outpatient Encounter Medications as of 10/05/2022  Medication Sig   acetaminophen (TYLENOL) 500 MG tablet Take 1,000 mg by mouth 2 (two) times daily. Scheduled and 2 by mouth as needed   docusate sodium (COLACE) 100 MG capsule Take 1 capsule (100 mg total) by mouth 2 (two) times daily.   donepezil (ARICEPT) 5 MG tablet Take 5 mg by mouth at bedtime.   escitalopram (LEXAPRO) 20 MG tablet Take 20 mg by mouth daily.   Evolocumab (REPATHA SURECLICK) 664 MG/ML SOAJ INJECT 1 PEN INTO SKIN EVERY 14 DAYS   ezetimibe (ZETIA) 10 MG tablet Take 5 mg by mouth daily.   gabapentin (NEURONTIN) 300 MG capsule Take 1 capsule (300 mg total) by mouth 3 (three) times daily.   levETIRAcetam (KEPPRA) 500 MG tablet Take 1 tablet (500 mg total) by mouth 2 (two) times daily for 5 days.   lidocaine 4 % Place 1 patch onto the skin 2 (two) times daily. Left side x 30 days   LORazepam  (ATIVAN) 0.5 MG tablet Take 0.5 mg by mouth 2 (two) times daily as needed for anxiety.   memantine (NAMENDA) 10 MG tablet Take 10 mg by mouth at bedtime.   nitroGLYCERIN (NITROSTAT) 0.4 MG SL tablet Place 0.4 mg under the tongue every 5 (five) minutes as needed for chest pain.   oxyCODONE (OXY IR/ROXICODONE) 5 MG immediate release tablet Take 1 tablet (5 mg total) by mouth every 6 (six) hours as needed for moderate pain.   triamcinolone cream (KENALOG) 0.1 % Apply 1 Application topically 2 (two) times daily. X 7 days   [DISCONTINUED] escitalopram (LEXAPRO) 20 MG tablet TAKE ONE TABLET BY MOUTH ONCE DAILY (Patient taking differently: Take 20 mg by mouth daily. TAKE ONE TABLET BY MOUTH ONCE DAILY)   [DISCONTINUED] nitroGLYCERIN (NITROSTAT) 0.4 MG SL tablet ONE TABLET UNDER TONGUE AS NEEDED FOR CHEST PAIN. MAY REPEAT IN 5 MINUTES AND AGAIN IN 5 MINUTES (TOTAL OF 3 TABLETS) (Patient taking differently: Place 0.4 mg under the tongue every 5 (five) minutes as needed for chest pain.)   abatacept (ORENCIA) 250 MG injection every 30 (thirty) days.   [DISCONTINUED] aspirin EC 81 MG tablet Take 81 mg by mouth daily. (Patient not taking: Reported on 09/23/2022)   [DISCONTINUED] ezetimibe (ZETIA) 10 MG tablet TAKE 1/2 TABLET BY MOUTH ONCE DAILY (Patient taking differently: Take 10 mg by mouth daily. '5mg'$  (1/2 tab) Mouth Daily)   [DISCONTINUED] metoprolol tartrate (LOPRESSOR) 25 MG tablet TAKE ONE TABLET BY MOUTH EVERYDAY AT BEDTIME (Patient taking differently: Take 25 mg by mouth 2 (two) times daily.)   [DISCONTINUED] OLANZapine (ZYPREXA) 2.5 MG tablet Take 2.5 mg by mouth at bedtime.   [DISCONTINUED] omeprazole (PRILOSEC) 40 MG capsule TAKE ONE CAPSULE BY MOUTH ONCE DAILY (Patient taking differently: Take 40 mg by mouth daily.)   [DISCONTINUED] rosuvastatin (CRESTOR) 5 MG tablet TAKE ONE TABLET BY MOUTH ONCE DAILY (Patient taking differently: Take 5 mg by mouth daily.)   Facility-Administered Encounter Medications  as of 10/05/2022  Medication   acetaminophen (TYLENOL) tablet 650 mg   docusate sodium (COLACE) capsule 100 mg   donepezil (ARICEPT) tablet 5 mg   escitalopram (LEXAPRO) tablet 20 mg   ezetimibe (ZETIA) tablet 5 mg   gabapentin (NEURONTIN) capsule 300 mg   HYDROmorphone (DILAUDID) injection 0.5 mg   levETIRAcetam (KEPPRA) tablet 500 mg   LORazepam (ATIVAN) tablet 0.5 mg  memantine (NAMENDA) tablet 10 mg   methocarbamol (ROBAXIN) 500 mg in dextrose 5 % 50 mL IVPB   metoprolol tartrate (LOPRESSOR) tablet 25 mg   OLANZapine (ZYPREXA) tablet 2.5 mg   ondansetron (ZOFRAN) injection 4 mg   oxyCODONE (Oxy IR/ROXICODONE) immediate release tablet 10 mg   oxyCODONE (Oxy IR/ROXICODONE) immediate release tablet 5 mg   pantoprazole (PROTONIX) EC tablet 40 mg   prochlorperazine (COMPAZINE) injection 10 mg   rosuvastatin (CRESTOR) tablet 5 mg   simethicone (MYLICON) chewable tablet 80 mg    Review of Systems  Unable to perform ROS: Dementia    Immunization History  Administered Date(s) Administered   Fluad Quad(high Dose 65+) 07/09/2019, 08/15/2020, 09/16/2021, 07/29/2022   Influenza Split 08/10/2011, 08/15/2012   Influenza Whole 08/21/2009, 09/22/2010   Influenza, High Dose Seasonal PF 09/16/2015, 08/18/2016, 08/31/2017, 08/14/2018   Influenza,inj,Quad PF,6+ Mos 08/06/2013   Influenza-Unspecified 08/31/2017   PFIZER(Purple Top)SARS-COV-2 Vaccination 12/11/2019, 01/01/2020, 08/12/2020   Pneumococcal Conjugate-13 10/02/2013   Pneumococcal Polysaccharide-23 06/04/2010   Tdap 10/22/2011, 09/22/2022   Zoster Recombinat (Shingrix) 03/09/2017, 05/10/2017   Pertinent  Health Maintenance Due  Topic Date Due   INFLUENZA VACCINE  Completed      10/03/2022    8:00 PM 10/04/2022    8:32 AM 10/04/2022    7:37 PM 10/05/2022    7:58 AM 10/05/2022    3:40 PM  Fall Risk  Falls in the past year?     1  Was there an injury with Fall?     1  Fall Risk Category Calculator     3  Fall Risk  Category     High  Patient Fall Risk Level High fall risk High fall risk High fall risk High fall risk High fall risk  Patient at Risk for Falls Due to     History of fall(s);Impaired balance/gait  Fall risk Follow up     Falls evaluation completed   Functional Status Survey:    Vitals:   10/05/22 1534  BP: (!) 148/80  Pulse: 70  Resp: 20  Temp: 98 F (36.7 C)  SpO2: 97%  Weight: 188 lb 8 oz (85.5 kg)  Height: 6' (1.829 m)   Body mass index is 25.57 kg/m. Physical Exam Vitals reviewed.  Constitutional:      General: He is not in acute distress. HENT:     Head: Laceration present. No raccoon eyes or Battle's sign.     Comments: Forehead with closed laceration, no drainage, facial bruising to forehead and left orbital    Eyes:     General:        Right eye: No discharge.        Left eye: No discharge.  Cardiovascular:     Rate and Rhythm: Normal rate. Rhythm irregular.     Pulses: Normal pulses.     Heart sounds: Normal heart sounds.  Pulmonary:     Effort: Pulmonary effort is normal. No respiratory distress.     Breath sounds: Normal breath sounds. No wheezing.  Chest:     Comments: Unable to examine chest due to increased confusion Abdominal:     General: Bowel sounds are normal. There is no distension.     Palpations: Abdomen is soft.     Tenderness: There is no abdominal tenderness.  Musculoskeletal:     Cervical back: Neck supple.     Right lower leg: No edema.     Left lower leg: No edema.  Skin:    General: Skin  is warm and dry.     Capillary Refill: Capillary refill takes less than 2 seconds.  Neurological:     General: No focal deficit present.     Mental Status: He is alert. Mental status is at baseline.     Motor: Weakness present.     Gait: Gait abnormal.  Psychiatric:        Mood and Affect: Mood normal.     Comments: Repetitive phrases, does not follow all commands, alert to self/person     Labs reviewed: Recent Labs    10/03/22 0310  10/04/22 0552 10/05/22 0243  NA 138 142 139  K 4.0 3.8 3.8  CL 104 106 107  CO2 '23 26 23  '$ GLUCOSE 96 97 109*  BUN '12 12 14  '$ CREATININE 0.91 0.95 0.93  CALCIUM 8.9 8.8* 8.7*   Recent Labs    07/29/22 1345 10/02/22 0350  AST 17 24  ALT 13 16  ALKPHOS 58 61  BILITOT 0.5 0.6  PROT 7.7 7.0  ALBUMIN 4.1 4.0   Recent Labs    07/29/22 1345 09/24/22 0000 10/03/22 0310 10/04/22 0552 10/05/22 0243  WBC 5.3   < > 7.2 6.1 6.4  NEUTROABS 2.7  --   --   --   --   HGB 12.8*   < > 12.4* 11.3* 10.8*  HCT 38.2*   < > 36.6* 33.7* 32.2*  MCV 99.3   < > 100.3* 98.5 98.8  PLT 274.0   < > 289 296 277   < > = values in this interval not displayed.   Lab Results  Component Value Date   TSH 1.18 12/13/2017   Lab Results  Component Value Date   HGBA1C 6.4 08/30/2017   Lab Results  Component Value Date   CHOL 197 08/04/2020   HDL 47 08/04/2020   LDLCALC 106 (H) 08/04/2020   LDLDIRECT 168.9 05/24/2013   TRIG 254 (H) 08/04/2020   CHOLHDL 4.2 08/04/2020    Significant Diagnostic Results in last 30 days:  DG CHEST PORT 1 VIEW  Result Date: 10/03/2022 CLINICAL DATA:  Follow-up rib fracture EXAM: PORTABLE CHEST 1 VIEW COMPARISON:  CT chest dated 10/02/2022 FINDINGS: Low lung volumes. Bibasilar patchy opacities. Unchanged blunting of the left costophrenic angle. No pneumothorax. Similar cardiomediastinal silhouette. Again seen are displaced left posterior fourth through sixth rib fractures. Nondisplaced seventh rib fracture is not well seen. Partially imaged cervical fixation hardware. IMPRESSION: Unchanged displaced left posterior fourth through sixth rib fractures. Nondisplaced seventh rib fracture is not well seen. No pneumothorax. Electronically Signed   By: Darrin Nipper M.D.   On: 10/03/2022 08:14   CT HEAD WO CONTRAST (5MM)  Result Date: 10/02/2022 CLINICAL DATA:  Follow-up subdural hemorrhage. EXAM: CT HEAD WITHOUT CONTRAST TECHNIQUE: Contiguous axial images were obtained from the  base of the skull through the vertex without intravenous contrast. RADIATION DOSE REDUCTION: This exam was performed according to the departmental dose-optimization program which includes automated exposure control, adjustment of the mA and/or kV according to patient size and/or use of iterative reconstruction technique. COMPARISON:  CT scan of brain October 02, 2022 at 4:03 a.m. FINDINGS: Brain: The left-sided subdural hematoma remains, similar in size in the interval. The hematoma measures up to 4 mm in thickness on series 4, image 24. Posteriorly, there is also left subdural blood on series 4, image 18 measuring 5 mm in thickness on today's study versus 5 mm earlier today. No significant change in the left-sided subdural hematoma to. No  other abnormal intracranial blood. No midline shift. Ventricles and sulci are prominent but stable. No blood in the ventricles. Cerebellum, brainstem, and basal cisterns are normal. Significant white matter changes remain. No acute cortical ischemia or infarct. Vascular: No hyperdense vessel or unexpected calcification. Skull: Normal. Negative for fracture or focal lesion. Sinuses/Orbits: No acute finding. Other: Soft tissue swelling in the left periorbital region. IMPRESSION: 1. The left-sided subdural hematoma is stable in the interval. No midline shift. 2. Soft tissue swelling in the left periorbital region. 3. No other acute intracranial abnormalities. Electronically Signed   By: Dorise Bullion III M.D.   On: 10/02/2022 17:34   CT CHEST ABDOMEN PELVIS W CONTRAST  Result Date: 10/02/2022 CLINICAL DATA:  88 year old male history of trauma from a fall. EXAM: CT CHEST, ABDOMEN, AND PELVIS WITH CONTRAST TECHNIQUE: Multidetector CT imaging of the chest, abdomen and pelvis was performed following the standard protocol during bolus administration of intravenous contrast. RADIATION DOSE REDUCTION: This exam was performed according to the departmental dose-optimization program  which includes automated exposure control, adjustment of the mA and/or kV according to patient size and/or use of iterative reconstruction technique. CONTRAST:  35m OMNIPAQUE IOHEXOL 350 MG/ML SOLN COMPARISON:  Chest CT 04/10/2019. CT of the abdomen and pelvis 09/17/2016. FINDINGS: CT CHEST FINDINGS Cardiovascular: Heart size is normal. There is no significant pericardial fluid, thickening or pericardial calcification. There is aortic atherosclerosis, as well as atherosclerosis of the great vessels of the mediastinum and the coronary arteries, including calcified atherosclerotic plaque in the left main, left anterior descending, left circumflex and right coronary arteries. Thickening and calcification of the aortic valve. Severe calcifications of the mitral annulus. 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 Left vertebral artery origin is densely calcified, and may be occluded or nearly completely occluded, although the left vertebral artery is patent distally. Mediastinum/Nodes: No high attenuation fluid collection in the mediastinum to suggest posttraumatic hematoma. No pathologically enlarged mediastinal or hilar lymph nodes. Esophagus is unremarkable in appearance. No axillary lymphadenopathy. Lungs/Pleura: No pneumothorax. There is some focal high attenuation along the posterolateral left chest wall adjacent to several left-sided rib fractures (discussed below), likely to represent some subpleural hemorrhage, measuring up to 1.8 cm in thickness. No freely flowing left pleural fluid collection to suggest hemothorax. No right pleural fluid collection. Patchy ill-defined opacities are noted in the dependent portion of the left lung base, likely areas of subsegmental atelectasis. No confluent consolidative airspace disease. A few scattered tiny pulmonary nodules are noted, largest of which is a subpleural nodule in the periphery of the right middle  lobe (axial image 94 of series 5) measuring 4 mm. These are stable compared to prior study from 04/10/2019, considered definitively benign (no imaging follow-up recommended). No larger more suspicious appearing pulmonary nodules or masses are noted. Musculoskeletal: Acute displaced fractures of the left posterior fourth, fifth and sixth ribs are noted. Potential nondisplaced fracture of the posterior left seventh rib also noted. No right-sided rib fractures or aggressive appearing lytic or blastic lesions are noted elsewhere in the visualized portions of the skeleton. CT ABDOMEN PELVIS FINDINGS Hepatobiliary: No evidence of significant acute traumatic injury to the liver. No suspicious cystic or solid hepatic lesions. No intra or extrahepatic biliary ductal dilatation. Gallbladder is unremarkable in appearance. Pancreas: No evidence of significant acute traumatic injury to the pancreas. No pancreatic mass. No pancreatic ductal dilatation. No pancreatic or peripancreatic fluid collections or inflammatory changes. Spleen: No evidence  of significant acute traumatic injury to the spleen. Spleen is normal in appearance. Adrenals/Urinary Tract: No evidence of significant acute traumatic injury to either kidney or adrenal gland. Small cortical calcification in the upper pole of the left kidney, benign in appearance. No aggressive appearing renal lesions. No hydroureteronephrosis. Urinary bladder appears intact and is normal in appearance. Bilateral adrenal glands are normal in appearance. Stomach/Bowel: No evidence of significant acute traumatic injury to the hollow viscera. Mural calcification in the cardia of the stomach (axial image 58 of series 4) stable compared to prior examinations, presumably benign. No pathologic dilatation of small bowel or colon. Numerous colonic diverticula are noted, particularly in the descending colon and sigmoid colon, without surrounding inflammatory changes to suggest an acute  diverticulitis at this time. Normal appendix. Vascular/Lymphatic: No evidence of significant acute traumatic injury to the abdominal aorta or major arteries/veins of the abdomen and pelvis. Extensive atherosclerosis throughout the abdominal aorta and pelvic vasculature, without evidence of aneurysm or dissection. No lymphadenopathy noted in the abdomen or pelvis. Reproductive: Prostate gland and seminal vesicles are unremarkable in appearance. Other: No high attenuation fluid collection in the peritoneal cavity or retroperitoneum to suggest significant posttraumatic hemorrhage. No significant volume of ascites. No pneumoperitoneum. Musculoskeletal: No acute displaced fractures or aggressive appearing lytic or blastic lesions are noted in the visualized portions of the skeleton. Interbody cages are noted at the L4-L5 and L5-S1 interspaces. IMPRESSION: 1. Acute mildly displaced fractures of the posterior left fourth, fifth and sixth ribs, with probable nondisplaced fracture of the posterior left seventh rib also noted. There is a small amount of subpleural hematoma adjacent to these rib fractures, and some subsegmental atelectasis in the left lung base presumably from splinting. No pneumothorax, hemothorax or acute mediastinal hemorrhage. 2. No evidence of significant acute traumatic injury to the abdomen or pelvis. 3. 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 Clinical correlation for signs and symptoms of subclavian steal syndrome is suggested. 4. There are calcifications of the aortic valve and mitral annulus. Echocardiographic correlation for evaluation of potential valvular dysfunction may be warranted if clinically indicated. 5. Aortic atherosclerosis, in addition to left main and three-vessel coronary artery disease. 6. Colonic diverticulosis without evidence of acute  diverticulitis at this time. 7. Additional incidental findings, as above. Electronically Signed   By: Vinnie Langton M.D.   On: 10/02/2022 05:47   DG Elbow 2 Views Left  Result Date: 10/02/2022 CLINICAL DATA:  Level 2 trauma.  Fall on blood thinners. EXAM: LEFT ELBOW - 2 VIEW COMPARISON:  None Available. FINDINGS: There is no evidence of fracture, dislocation, or joint effusion. Atheromatous calcification in the arm. IMPRESSION: Negative for fracture or subluxation. Electronically Signed   By: Jorje Guild M.D.   On: 10/02/2022 04:31   DG Pelvis Portable  Result Date: 10/02/2022 CLINICAL DATA:  Level 2 trauma from ground level fall EXAM: PORTABLE PELVIS 1-2 VIEWS COMPARISON:  None Available. FINDINGS: There is no evidence of pelvic fracture or diastasis. Both hips are located. Advanced lumbar spine degeneration with lower fusions. Atherosclerosis. IMPRESSION: No acute finding. Electronically Signed   By: Jorje Guild M.D.   On: 10/02/2022 04:27   DG Chest Port 1 View  Result Date: 10/02/2022 CLINICAL DATA:  No L2 trauma EXAM: PORTABLE CHEST 1 VIEW COMPARISON:  09/22/2022 FINDINGS: Artifact from EKG leads. Posterior left fourth through sixth rib fractures, interval and displaced.  Low volume chest with interstitial crowding at the bases. No air bronchogram, edema, effusion, or pneumothorax. IMPRESSION: 1. Acute left fourth through sixth rib fractures with displacement. No hemothorax or pneumothorax. 2. Low volume chest with presumed atelectasis at the bases. Electronically Signed   By: Jorje Guild M.D.   On: 10/02/2022 04:26   CT HEAD WO CONTRAST  Result Date: 10/02/2022 CLINICAL DATA:  Mechanical fall EXAM: CT HEAD WITHOUT CONTRAST CT CERVICAL SPINE WITHOUT CONTRAST TECHNIQUE: Multidetector CT imaging of the head and cervical spine was performed following the standard protocol without intravenous contrast. Multiplanar CT image reconstructions of the cervical spine were also generated.  RADIATION DOSE REDUCTION: This exam was performed according to the departmental dose-optimization program which includes automated exposure control, adjustment of the mA and/or kV according to patient size and/or use of iterative reconstruction technique. COMPARISON:  09/22/2022 FINDINGS: CT HEAD FINDINGS Brain: High-density subdural hematoma along the lateral left cerebellum measuring up to 4 mm in thickness. Brain atrophy that is pronounced with confluent chronic small vessel ischemia in the deep cerebral white matter. Small vessel disease likely also involves the brainstem and deep gray nuclei. No acute cortical infarct, mass, or hydrocephalus. Vascular: No hyperdense vessel or unexpected calcification. Skull: Swelling around the left orbit and forehead. No acute fracture Sinuses/Orbits: No visible injury CT CERVICAL SPINE FINDINGS Alignment: No traumatic malalignment Skull base and vertebrae: No acute fracture. ACDF with plate from C4 to C6. 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 Soft tissues and spinal canal: No prevertebral fluid or swelling. No visible canal hematoma. Disc levels:  Diffuse degenerative endplate spurring. Upper chest: No visible injury Critical Value/emergent results were called by telephone at the time of interpretation on 10/02/2022 at 4:21 am to provider Montine Circle , who verbally acknowledged these results. IMPRESSION: 1. Acute subdural hematoma along the lateral left cerebral convexity measuring up to 4 mm in thickness. 2. Negative for cervical spine fracture. 3. Advanced atrophy and chronic small vessel ischemia. Electronically Signed   By: Jorje Guild M.D.   On: 10/02/2022 04:22   CT CERVICAL SPINE WO CONTRAST  Result Date: 10/02/2022 CLINICAL DATA:  Mechanical fall EXAM: CT HEAD WITHOUT CONTRAST CT CERVICAL SPINE WITHOUT CONTRAST TECHNIQUE: Multidetector CT imaging of the head and cervical spine was performed following the standard protocol without intravenous  contrast. Multiplanar CT image reconstructions of the cervical spine were also generated. RADIATION DOSE REDUCTION: This exam was performed according to the departmental dose-optimization program which includes automated exposure control, adjustment of the mA and/or kV according to patient size and/or use of iterative reconstruction technique. COMPARISON:  09/22/2022 FINDINGS: CT HEAD FINDINGS Brain: High-density subdural hematoma along the lateral left cerebellum measuring up to 4 mm in thickness. Brain atrophy that is pronounced with confluent chronic small vessel ischemia in the deep cerebral white matter. Small vessel disease likely also involves the brainstem and deep gray nuclei. No acute cortical infarct, mass, or hydrocephalus. Vascular: No hyperdense vessel or unexpected calcification. Skull: Swelling around the left orbit and forehead. No acute fracture Sinuses/Orbits: No visible injury CT CERVICAL SPINE FINDINGS Alignment: No traumatic malalignment Skull base and vertebrae: No acute fracture. ACDF with plate from C4 to C6. 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 Soft tissues and spinal canal: No prevertebral fluid or swelling. No visible canal hematoma. Disc levels:  Diffuse degenerative endplate spurring. Upper chest: No visible injury Critical Value/emergent results were called by telephone at the time of interpretation on 10/02/2022 at 4:21 am to provider [facility] ,  who verbally acknowledged these results. IMPRESSION: 1. Acute subdural hematoma along the lateral left cerebral convexity measuring up to 4 mm in thickness. 2. Negative for cervical spine fracture. 3. Advanced atrophy and chronic small vessel ischemia. Electronically Signed   By: Jorje Guild M.D.   On: 10/02/2022 04:22    Assessment/Plan 1. Subdural hematoma ([place]) - 11/18 multiple falls - CT head revealed acute subdural hematoma along lateral left cerebral convexity measuring 4 mm - neurosurgery recommended stopping  Eliquis minimum 2 weeks - discharge recommends stopping due to frequent falls  2. Laceration of forehead, subsequent encounter - closed, no sign of infection - 11/08 3 dissolvable sutures placed  3. Closed rib fracture of left side, initial encounter - 11/18 multiple falls - CT chest revealed acute rib fractures to posterior left 4th/5th/6th /7th - cont oxycodone  4. 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 Paroxysmal atrial fibrillation (HCC) - not on rate control medication - off Eliquis, see above  6. Primary hypertension - off lisinopril - orthostatic hypotension during short stay in memory care  7. Coronary artery disease involving native coronary artery of native heart without angina pectoris - h/o MI - cont statin - off asa  8 . Mixed hyperlipidemia - cont statin, repatha   9. OSA (obstructive sleep apnea) - noncompliant with CPAP  9. Rheumatoid arthritis involving elbow, unspecified laterality, unspecified whether rheumatoid factor present [facility]) - followed by Rheumatology    Family/ staff Communication: plan discussed with patient and nurse  Labs/tests ordered:  none

## 2022-10-05 NOTE — Progress Notes (Signed)
PTAR on unit to transport pt to [facility] given all belongs and prescription. Pt daughter notified all questions answered.

## 2022-10-05 NOTE — TOC Transition Note (Signed)
Transition of Care [facility]) - CM/SW Discharge Note   Patient Details  Name: [name] MRN: [id number] Date of Birth: May 06, 1933  Transition of [facility]) CM/SW Contact:  Ella Bodo, RN Phone Number: 10/05/2022, 11:30am  Clinical Narrative:    Patient medically stable for discharge to [facility] today.  Spoke with Santiago Glad at [facility] to confirm bed; she states facility is ready for patient.  Discharge summary and SNF transfer report forwarded to facility.  Bedside nurse to call report to [phone].  Called patient's wife [name] and she is agreeable to dc plan. PTAR notified for transport at 11:33am; state pickup likely within one hour.     Final next level of care: Skilled Nursing Facility Barriers to Discharge: Barriers Resolved   Patient Goals and CMS Choice   CMS Medicare.gov Compare Post Acute Care list provided to:: Patient Choice offered to / list presented to : Spouse, Patient  Discharge Placement PASRR number recieved: 10/04/22            Patient chooses bed at: [facility] Patient to be transferred to facility by: [facility] Name of family member notified: [name], wife Patient and family notified of of transfer: 10/05/22  Discharge Plan and Services   Discharge Planning Services: CM Consult Post Acute Care Choice: [facility]                               Social Determinants of Health (SDOH) Interventions     Readmission Risk Interventions     No data to display         Reinaldo Raddle, RN, BSN  Trauma/Neuro ICU Case Manager [phone]

## 2022-10-05 NOTE — Care Management Important Message (Signed)
Important Message  Patient Details  Name: [name] MRN: [id number] Date of Birth: November 12, 1933   Medicare Important Message Given:  Yes     Hannah Beat 10/05/2022, 11:33 AM

## 2022-10-05 NOTE — Progress Notes (Signed)
Pt with orders to d/c back to [facility]. Report called to Quillian Quince at [facility] all questions answered. Discharge packet placed on chart for PTAR. Printed prescription placed on chart for PTAR. PIV removed. Pt is stable awaiting [place].

## 2022-10-05 NOTE — Discharge Instructions (Signed)
Hold Eliquis for a minimum of 2 weeks upon discharge. Strongly recommend consideration of discontinuation of Eliquis given frequent falls.

## 2022-10-05 NOTE — Progress Notes (Signed)
Physical Therapy Treatment Patient Details Name: [name] MRN: [id number] DOB: August 18, 1933 Today's Date: 10/05/2022   History of Present Illness 88 y.o. year-old male presents to the ED with chief complaint of ground-level fall. L 4-7 rib fractures with displacement, Subdural hematoma.  PMH includes: CAD, GERD, HTN, hx of falls, OSA, arthritis.    PT Comments    Patient progressing slowly towards PT goals. Session focused on bed mobility, transfers and progressive ambulation. Pt more confused today reporting pain in left ribs and asking over and over again why he was here and what happened. Pt easily irritated. Requires mod A of 2 for bed mobility, standing and min A of 2 for gait training with use of RW. Able to progress ambulation distance today and maintain Sp02 >90% on RA with activity. Cognitive deficits put pt at increased risk for falls. Continues to be appropriate for SNF. Will follow.    Recommendations for follow up therapy are one component of a multi-disciplinary discharge planning process, led by the attending physician.  Recommendations may be updated based on patient status, additional functional criteria and insurance authorization.  Follow Up Recommendations  Skilled nursing-short term rehab (<3 hours/day) Can patient physically be transported by private vehicle: Yes   Assistance Recommended at Discharge Frequent or constant Supervision/Assistance  Patient can return home with the following A little help with walking and/or transfers;A lot of help with bathing/dressing/bathroom;Assist for transportation;Assistance with cooking/housework   Equipment Recommendations  Rolling walker (2 wheels)    Recommendations for Other Services       Precautions / Restrictions Precautions Precautions: Fall Restrictions Weight Bearing Restrictions: No     Mobility  Bed Mobility Overal bed mobility: Needs Assistance Bed Mobility: Rolling, Sidelying to Sit Rolling: Min  assist Sidelying to sit: Mod assist, +2 for physical assistance, HOB elevated       General bed mobility comments: Step by step cues for sequencing for log roll techinque, use of rail , assist with LEs and trunk to get to EOB.    Transfers Overall transfer level: Needs assistance Equipment used: Rolling walker (2 wheels) Transfers: Sit to/from Stand, Bed to chair/wheelchair/BSC Sit to Stand: Mod assist, +2 physical assistance, From elevated surface   Step pivot transfers: Min assist       General transfer comment: Mod A to power to standing with cues for hand placement/technique from EOB x1, better posture today, limited by pain. Transferred to chair post ambulation.    Ambulation/Gait Ambulation/Gait assistance: Min assist, +2 safety/equipment Gait Distance (Feet): 20 Feet Assistive device: Rolling walker (2 wheels) Gait Pattern/deviations: Step-through pattern, Decreased stride length, Decreased step length - right, Decreased step length - left, Trunk flexed Gait velocity: decreased Gait velocity interpretation: <1.8 ft/sec, indicate of risk for recurrent falls   General Gait Details: Slow, unsteady gait with flexed posture and short shuffling steps. Cues for RW proximity/management. Difficulty with turns, very slow. Sp02 remained >90% on RA.   Stairs             Wheelchair Mobility    Modified Rankin (Stroke Patients Only)       Balance Overall balance assessment: Needs assistance Sitting-balance support: No upper extremity supported, Feet supported Sitting balance-Leahy Scale: Good     Standing balance support: Bilateral upper extremity supported, During functional activity Standing balance-Leahy Scale: Poor Standing balance comment: reliant on external support and UE support  Cognition Arousal/Alertness: Awake/alert Behavior During Therapy: WFL for tasks assessed/performed Overall Cognitive Status: No  family/caregiver present to determine baseline cognitive functioning Area of Impairment: Memory, Orientation                 Orientation Level: Disoriented to, Place, Time, Situation   Memory: Decreased short-term memory         General Comments: Pt more confused today, asking over and over again why he is here, what happened and his age. Talking about his mother. easily irritated today with questions.        Exercises      General Comments General comments (skin integrity, edema, etc.): Sp02 >90% on RA during session.      Pertinent Vitals/Pain Pain Assessment Pain Assessment: Faces Faces Pain Scale: Hurts whole lot Pain Location: L ribs Pain Descriptors / Indicators: Grimacing, Guarding, Sharp, Discomfort, Sore Pain Intervention(s): Monitored during session, Repositioned, Patient requesting pain meds-RN notified, Limited activity within patient's tolerance    Home Living                          Prior Function            PT Goals (current goals can now be found in the care plan section) Progress towards PT goals: Progressing toward goals    Frequency    Min 3X/week      PT Plan Current plan remains appropriate    Co-evaluation              AM-PAC PT "6 Clicks" Mobility   Outcome Measure  Help needed turning from your back to your side while in a flat bed without using bedrails?: A Little Help needed moving from lying on your back to sitting on the side of a flat bed without using bedrails?: A Lot Help needed moving to and from a bed to a chair (including a wheelchair)?: A Lot Help needed standing up from a chair using your arms (e.g., wheelchair or bedside chair)?: A Lot Help needed to walk in hospital room?: A Little Help needed climbing 3-5 steps with a railing? : Total 6 Click Score: 13    End of Session Equipment Utilized During Treatment: Gait belt Activity Tolerance: Patient limited by pain Patient left: in chair;with  call bell/phone within reach;with chair alarm set Nurse Communication: Mobility status;Patient requests pain meds PT Visit Diagnosis: Muscle weakness (generalized) (M62.81);Difficulty in walking, not elsewhere classified (R26.2);Pain Pain - Right/Left: Left Pain - part of body:  (ribs)     Time: 3419-3790 PT Time Calculation (min) (ACUTE ONLY): 17 min  Charges:  $Gait Training: 8-22 mins                     Marisa Severin, PT, DPT Acute Rehabilitation Services Secure chat preferred Office [place] 10/05/2022, 10:50 AM

## 2022-10-05 NOTE — Progress Notes (Signed)
Pt desating to 86% sustaining while sleeping. RN placed 2L [employer] sating now at 94%.

## 2022-10-06 ENCOUNTER — Ambulatory Visit: Payer: Medicare Other | Admitting: Physician Assistant

## 2022-10-06 DIAGNOSIS — S2242XS Multiple fractures of ribs, left side, sequela: Secondary | ICD-10-CM | POA: Diagnosis not present

## 2022-10-06 DIAGNOSIS — R2689 Other abnormalities of gait and mobility: Secondary | ICD-10-CM | POA: Diagnosis not present

## 2022-10-06 DIAGNOSIS — R278 Other lack of coordination: Secondary | ICD-10-CM | POA: Diagnosis not present

## 2022-10-06 DIAGNOSIS — S065XAS Traumatic subdural hemorrhage with loss of consciousness status unknown, sequela: Secondary | ICD-10-CM | POA: Diagnosis not present

## 2022-10-06 DIAGNOSIS — Z9181 History of falling: Secondary | ICD-10-CM | POA: Diagnosis not present

## 2022-10-08 DIAGNOSIS — R278 Other lack of coordination: Secondary | ICD-10-CM | POA: Diagnosis not present

## 2022-10-08 DIAGNOSIS — R2689 Other abnormalities of gait and mobility: Secondary | ICD-10-CM | POA: Diagnosis not present

## 2022-10-08 DIAGNOSIS — Z9181 History of falling: Secondary | ICD-10-CM | POA: Diagnosis not present

## 2022-10-08 DIAGNOSIS — S2242XS Multiple fractures of ribs, left side, sequela: Secondary | ICD-10-CM | POA: Diagnosis not present

## 2022-10-08 DIAGNOSIS — S065XAS Traumatic subdural hemorrhage with loss of consciousness status unknown, sequela: Secondary | ICD-10-CM | POA: Diagnosis not present

## 2022-10-11 ENCOUNTER — Non-Acute Institutional Stay (SKILLED_NURSING_FACILITY): Payer: Medicare Other | Admitting: Internal Medicine

## 2022-10-11 DIAGNOSIS — M069 Rheumatoid arthritis, unspecified: Secondary | ICD-10-CM

## 2022-10-11 DIAGNOSIS — S0181XD Laceration without foreign body of other part of head, subsequent encounter: Secondary | ICD-10-CM | POA: Diagnosis not present

## 2022-10-11 DIAGNOSIS — S065XAS Traumatic subdural hemorrhage with loss of consciousness status unknown, sequela: Secondary | ICD-10-CM | POA: Diagnosis not present

## 2022-10-11 DIAGNOSIS — R2689 Other abnormalities of gait and mobility: Secondary | ICD-10-CM | POA: Diagnosis not present

## 2022-10-11 DIAGNOSIS — Z79899 Other long term (current) drug therapy: Secondary | ICD-10-CM | POA: Diagnosis not present

## 2022-10-11 DIAGNOSIS — E782 Mixed hyperlipidemia: Secondary | ICD-10-CM | POA: Diagnosis not present

## 2022-10-11 DIAGNOSIS — I480 Paroxysmal atrial fibrillation: Secondary | ICD-10-CM | POA: Diagnosis not present

## 2022-10-11 DIAGNOSIS — I10 Essential (primary) hypertension: Secondary | ICD-10-CM

## 2022-10-11 DIAGNOSIS — F02B3 Dementia in other diseases classified elsewhere, moderate, with mood disturbance: Secondary | ICD-10-CM | POA: Diagnosis not present

## 2022-10-11 DIAGNOSIS — S065XAA Traumatic subdural hemorrhage with loss of consciousness status unknown, initial encounter: Secondary | ICD-10-CM | POA: Diagnosis not present

## 2022-10-11 DIAGNOSIS — G4733 Obstructive sleep apnea (adult) (pediatric): Secondary | ICD-10-CM | POA: Diagnosis not present

## 2022-10-11 DIAGNOSIS — Z9181 History of falling: Secondary | ICD-10-CM | POA: Diagnosis not present

## 2022-10-11 DIAGNOSIS — G308 Other Alzheimer's disease: Secondary | ICD-10-CM | POA: Diagnosis not present

## 2022-10-11 DIAGNOSIS — R278 Other lack of coordination: Secondary | ICD-10-CM | POA: Diagnosis not present

## 2022-10-11 DIAGNOSIS — S2242XS Multiple fractures of ribs, left side, sequela: Secondary | ICD-10-CM | POA: Diagnosis not present

## 2022-10-11 LAB — HEPATIC FUNCTION PANEL
ALT: 13 U/L (ref 10–40)
AST: 21 (ref 14–40)
Alkaline Phosphatase: 89 (ref 25–125)
Bilirubin, Total: 0.2

## 2022-10-11 LAB — COMPREHENSIVE METABOLIC PANEL
Albumin: 3.7 (ref 3.5–5.0)
Calcium: 8.8 (ref 8.7–10.7)
Globulin: 2.6
eGFR: 84

## 2022-10-11 LAB — BASIC METABOLIC PANEL
BUN: 16 (ref 4–21)
CO2: 25 — AB (ref 13–22)
Chloride: 107 (ref 99–108)
Creatinine: 0.9 (ref 0.6–1.3)
Glucose: 93
Potassium: 4.1 mEq/L (ref 3.5–5.1)
Sodium: 143 (ref 137–147)

## 2022-10-11 LAB — CBC AND DIFFERENTIAL
HCT: 32 — AB (ref 41–53)
Hemoglobin: 10.8 — AB (ref 13.5–17.5)
WBC: 5.4

## 2022-10-11 LAB — CBC: RBC: 3.25 — AB (ref 3.87–5.11)

## 2022-10-11 NOTE — Progress Notes (Signed)
Provider:   Location:  Occupational psychologist of Service:  SNF (31)  PCP: Plotnikov, Evie Lacks, MD Patient Care Team: Cassandria Anger, MD as PCP - General Sueanne Margarita, MD as PCP - Cardiology (Cardiology) Thompson Grayer, MD as PCP - Electrophysiology (Cardiology) Irene Shipper, MD (Gastroenterology) Thompson Grayer, MD (Cardiology) Hennie Duos, MD as Consulting Physician (Rheumatology) Alexis Frock, MD as Consulting Physician (Urology) Katy Apo, MD as Consulting Physician (Ophthalmology) Mathis Fare (Dentistry) Tiajuana Amass, MD as Referring Physician (Allergy and Immunology) Cameron Sprang, MD as Consulting Physician (Neurology) Charlton Haws, [facility] as Pharmacist (Pharmacist) Cameron Sprang, MD as Consulting Physician (Neurology)  Extended Emergency Contact Information Primary Emergency Contact: Koci,[name] Address: [place], [place] Phone: [phone] Work Phone: [phone] Mobile Phone: [phone] Relation: Spouse Secondary Emergency Contact: [place] Mobile Phone: [phone] Relation: Daughter  Code Status: DNR Goals of Care: Advanced Directive information    10/05/2022    4:19 PM  Advanced Directives  Does Patient Have a Medical Advance Directive? Yes  Type of Paramedic of [place];Living will  Does patient want to make changes to medical advance directive? No - Patient declined  Copy of [place] in Chart? Yes - validated most recent copy scanned in chart (See row information)      Chief Complaint  Patient presents with  . New Admit To SNF    HPI: Patient is a 88 y.o. male seen today for admission to SNF   Admitted in the hospital from 11/18-11/21 For Subdural Hematoma after Fall  Patient has h/o Dementia with behavior issues, Rheumatoid Arthritis, A Fib, HTN, CAD s/p PCI, GERD and Sleep apnea And  Depression   Lives with his wife in [place] in [place] Patient fell in his apartment.  Her wife he had an accident and then slipped on his urine and fell.  CT of the head showed Acute subdural hematoma along the lateral left cerebral convexity measuring up to 4 mm in thickness. Advanced atrophy and chronic small vessel ischemia CT chest showed multiple Rib fractures 4-6 left side  His Eliquis stopped.  He is now in Rehab for therapy But wants to go home No Complains No Headache or dizziness   Lives with his wife Independent in his ADLS but depend on her for IADLS She is planning to hire Caregivers   Past Medical History:  Diagnosis Date  . CAD (coronary artery disease)    a. s/p inferior STEMI 01/08/2014; LHC 01/08/14: total RCA occlusion s/p 3.5x35m Xience DES distal RCA and 3.5x28 mm DES mid RCA, 60-70% mid LAD stenosis, EF 55%.  . Complication of anesthesia    'long to wake up after back surgery " 09/2003  . Diverticulosis of colon   . GERD (gastroesophageal reflux disease)   . History of cellulitis    05-26-2015  LLE  . History of colon polyps    1998- benign/  2008 adenomatous   . History of kidney stones    2013  . History of squamous cell carcinoma excision    2013;  2015;  06-12-2015 right leg/  02/ and 05/ 2017  left ear and left leg  . Hydronephrosis, left   . Hyperlipidemia   . Hypertension   . Migraine with aura   . OSA on CPAP 06/19/2015   Moderate OSA with AHI 18/hr  per study 05-20-2015  .  Osteoarthritis   . Paroxysmal atrial fibrillation ([place]) 4/16   chads2vasc score is at least 4  . Premature atrial contractions   . RA (rheumatoid arthritis) [facility])    rheumatologist-  dr Leigh Aurora  . Sinusitis, chronic 10/17/2015  . Wears glasses   . Wears hearing aid    bilateral   Past Surgical History:  Procedure Laterality Date  . BACK SURGERY    . CARDIOVASCULAR STRESS TEST  11/21/2015   Low risk nuclear study w/ a small diaphragmatic attenuation artifact, no  ischemia/  normal LV function and wall motion , ef 63%  . CATARACT EXTRACTION W/ INTRAOCULAR LENS  IMPLANT, BILATERAL  2015  . COLONOSCOPY  last one 06-09-2011  . CORONARY ANGIOPLASTY    . CYSTOSCOPY W/ RETROGRADES Left 07/12/2016   Procedure: CYSTOSCOPY WITH RETROGRADE PYELOGRAM LEFT URETERAL STENT;  Surgeon: Irine Seal, MD;  Location: [facility];  Service: Urology;  Laterality: Left;  . CYSTOSCOPY WITH RETROGRADE PYELOGRAM, URETEROSCOPY AND STENT PLACEMENT Left 08/11/2016   Procedure: CYSTOSCOPY WITH RETROGRADE PYELOGRAM,  DIAGNOSTIC URETEROSCOPY , STENT EXCHANGE;  Surgeon: Alexis Frock, MD;  Location: [facility];  Service: Urology;  Laterality: Left;  . 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   severe fibromatosis palm and fingers  . LEFT HEART CATHETERIZATION WITH CORONARY ANGIOGRAM N/A 01/08/2014   Procedure: LEFT HEART CATHETERIZATION WITH CORONARY ANGIOGRAM;  Surgeon: Troy Sine, MD;  Location: [facility] CATH LAB;  Service: Cardiovascular;  Laterality:N/A;  total/ subtotal RCA/  mLAD 96-04% w/ mid systolic bridging/  preserved global LVF, ef 55%  . MOHS SURGERY  x2  feb and may 2017   left ear /  left leg  ([facility])  . PERCUTANEOUS CORONARY STENT INTERVENTION (PCI-S)  01/08/2014   Procedure: PERCUTANEOUS CORONARY STENT INTERVENTION (PCI-S);  Surgeon: Troy Sine, MD;  Location: [facility] C[facility] LAB;  Service: Cardiovascular;;  DES to mid and distal RCA  . POSTERIOR LUMBAR FUSION  10/01/2003   and Laminectomy/ diskectomy  L4 -- S1  . ROBOT ASSISTED PYELOPLASTY Left 09/15/2016   Procedure: XI ROBOTIC ASSISTED PYELOPLASTY;  Surgeon: Alexis Frock, MD;  Location: [facility];  Service: Urology;  Laterality: Left;  . TONSILLECTOMY    . TRANSTHORACIC ECHOCARDIOGRAM  02/23/2016   mild LVH,  ef 50-55%,  grade 1 diastolic dysfunction/  mild to moderate AV calcification w/ no stenosis or regurg./  trivial MR and TR/  mild PR    reports that he quit smoking about 54 years ago. His smoking use  included cigarettes. He has never used smokeless tobacco. He reports current alcohol use of about 2.0 standard drinks of alcohol per week. He reports that he does not use drugs. Social History   Socioeconomic History  . Marital status: Married    Spouse name: Not on file  . Number of children: 2  . Years of education: Not on file  . Highest education level: Not on file  Occupational History  . Occupation: Retired    Fish farm manager: RETIRED  Tobacco Use  . Smoking status: Former    Years: 10.00    Types: Cigarettes    Quit date: 08/09/1968    Years since quitting: 54.2  . Smokeless tobacco: Never  Vaping Use  . Vaping Use: Never used  Substance and Sexual Activity  . Alcohol use: Yes    Alcohol/week: 2.0 standard drinks of alcohol    Types: 2 Shots of liquor per week    Comment: daily  . Drug use: No  . Sexual  activity: Yes    Partners: Female  Other Topics Concern  . Not on file  Social History Narrative   1 Caffeine drink daily    Right handed       Lives with wife      Social Determinants of Health   Financial Resource Strain: Low Risk  (06/25/2022)   Overall Financial Resource Strain (CARDIA)   . Difficulty of Paying Living Expenses: Not hard at all  Food Insecurity: No Food Insecurity (06/25/2022)   Hunger Vital Sign   . Worried About Charity fundraiser in the Last Year: Never true   . Ran Out of Food in the Last Year: Never true  Transportation Needs: No Transportation Needs (06/25/2022)   PRAPARE - Transportation   . Lack of Transportation (Medical): No   . Lack of Transportation (Non-Medical): No  Physical Activity: Insufficiently Active (06/25/2022)   Exercise Vital Sign   . Days of Exercise per Week: 3 days   . Minutes of Exercise per Session: 30 min  Stress: No Stress Concern Present (06/25/2022)   [place]   . Feeling of Stress : Not at all  Social Connections: Moderately Integrated  (06/25/2022)   Social Connection and Isolation Panel [NHANES]   . Frequency of Communication with Friends and Family: Three times a week   . Frequency of Social Gatherings with Friends and Family: Three times a week   . Attends Religious Services: More than 4 times per year   . Active Member of Clubs or Organizations: No   . Attends Archivist Meetings: Never   . Marital Status: Married  Human resources officer Violence: Not At Risk (06/25/2022)   Humiliation, Afraid, Rape, and Kick questionnaire   . Fear of Current or Ex-Partner: No   . Emotionally Abused: No   . Physically Abused: No   . Sexually Abused: No    Functional Status Survey:    Family History  Problem Relation Age of Onset  . Hypertension Mother   . Heart disease Father   . Colon cancer Neg Hx     Health Maintenance  Topic Date Due  . COVID-19 Vaccine (4 - 2023-24 season) 07/16/2022  . Medicare Annual Wellness (AWV)  06/26/2023  . Pneumonia Vaccine 50+ Years old  Completed  . INFLUENZA VACCINE  Completed  . Zoster Vaccines- Shingrix  Completed  . HPV VACCINES  Aged Out    Allergies  Allergen Reactions  . Methotrexate Other (See Comments)    REACTION: tachycardia  . Aricept [Donepezil Hcl]     nightmares   . Crestor [Rosuvastatin Calcium]     Myalgias with '20mg'$  dose  . Lisinopril Other (See Comments)    cough  . Namenda [Memantine]     n/d  . Atorvastatin Other (See Comments)    Muscle pain, leg cramps  . Pravastatin Sodium Other (See Comments)    REACTION: cramps, fatigue    Outpatient Encounter Medications as of 10/11/2022  Medication Sig  . aspirin EC 81 MG tablet Take 81 mg by mouth daily. Swallow whole.  [name] OLANZapine (ZYPREXA) 2.5 MG tablet Take 2.5 mg by mouth at bedtime.  [name] abatacept (ORENCIA) 250 MG injection every 30 (thirty) days.  [name] acetaminophen (TYLENOL) 500 MG tablet Take 1,000 mg by mouth 2 (two) times daily. Scheduled and 2 by mouth as needed  . docusate sodium (COLACE) 100 MG  capsule Take 1 capsule (100 mg total) by mouth 2 (two) times  daily.  . donepezil (ARICEPT) 5 MG tablet Take 5 mg by mouth at bedtime.  [name] escitalopram (LEXAPRO) 20 MG tablet Take 20 mg by mouth daily.  . Evolocumab (REPATHA SURECLICK) 465 MG/ML SOAJ INJECT 1 PEN INTO SKIN EVERY 14 DAYS  . ezetimibe (ZETIA) 10 MG tablet Take 5 mg by mouth daily.  [name] gabapentin (NEURONTIN) 300 MG capsule Take 1 capsule (300 mg total) by mouth 3 (three) times daily.  [name] levETIRAcetam (KEPPRA) 500 MG tablet Take 1 tablet (500 mg total) by mouth 2 (two) times daily for 5 days.  [name] lidocaine 4 % Place 1 patch onto the skin 2 (two) times daily. Left side x 30 days  . LORazepam (ATIVAN) 0.5 MG tablet Take 0.5 mg by mouth 2 (two) times daily as needed for anxiety.  . memantine (NAMENDA) 10 MG tablet Take 10 mg by mouth at bedtime.  . nitroGLYCERIN (NITROSTAT) 0.4 MG SL tablet Place 0.4 mg under the tongue every 5 (five) minutes as needed for chest pain.  [name] oxyCODONE (OXY IR/ROXICODONE) 5 MG immediate release tablet Take 1 tablet (5 mg total) by mouth every 6 (six) hours as needed for moderate pain.  [name] triamcinolone cream (KENALOG) 0.1 % Apply 1 Application topically 2 (two) times daily. X 7 days   No facility-administered encounter medications on file as of 10/11/2022.    Review of Systems  Constitutional:  Negative for activity change, appetite change and unexpected weight change.  HENT: Negative.    Respiratory:  Negative for cough and shortness of breath.   Cardiovascular:  Negative for leg swelling.  Gastrointestinal:  Negative for constipation.  Genitourinary:  Negative for frequency.  Musculoskeletal:  Negative for arthralgias, gait problem and myalgias.  Skin: Negative.  Negative for rash.  Neurological:  Negative for dizziness and weakness.  Psychiatric/Behavioral:  Positive for confusion. Negative for sleep disturbance.   All other systems reviewed and are negative.   There were no vitals filed for this  visit. There is no height or weight on file to calculate BMI. Physical Exam Vitals reviewed.  Constitutional:      Appearance: Normal appearance.  HENT:     Head: Normocephalic.     Nose: Nose normal.     Mouth/Throat:     Mouth: Mucous membranes are moist.     Pharynx: Oropharynx is clear.  Eyes:     Pupils: Pupils are equal, round, and reactive to light.  Cardiovascular:     Rate and Rhythm: Normal rate and regular rhythm.     Pulses: Normal pulses.     Heart sounds: No murmur heard. Pulmonary:     Effort: Pulmonary effort is normal. No respiratory distress.     Breath sounds: Normal breath sounds. No rales.  Abdominal:     General: Abdomen is flat. Bowel sounds are normal.     Palpations: Abdomen is soft.  Musculoskeletal:        General: No swelling.     Cervical back: Neck supple.  Skin:    General: Skin is warm.     Findings: Bruising present.  Neurological:     General: No focal deficit present.     Mental Status: He is alert.  Psychiatric:        Mood and Affect: Mood normal.        Thought Content: Thought content normal.    Labs reviewed: Basic Metabolic Panel: Recent Labs    10/03/22 0310 10/04/22 0552 10/05/22 0243  NA 138 142 139  K 4.0 3.8 3.8  CL 104 106 107  CO2 '23 26 23  '$ GLUCOSE 96 97 109*  BUN '12 12 14  '$ CREATININE 0.91 0.95 0.93  CALCIUM 8.9 8.8* 8.7*   Liver Function Tests: Recent Labs    07/29/22 1345 10/02/22 0350  AST 17 24  ALT 13 16  ALKPHOS 58 61  BILITOT 0.5 0.6  PROT 7.7 7.0  ALBUMIN 4.1 4.0   No results for input(s): "LIPASE", "AMYLASE" in the last 8760 hours. No results for input(s): "AMMONIA" in the last 8760 hours. CBC: Recent Labs    07/29/22 1345 09/24/22 0000 10/03/22 0310 10/04/22 0552 10/05/22 0243  WBC 5.3   < > 7.2 6.1 6.4  NEUTROABS 2.7  --   --   --   --   HGB 12.8*   < > 12.4* 11.3* 10.8*  HCT 38.2*   < > 36.6* 33.7* 32.2*  MCV 99.3   < > 100.3* 98.5 98.8  PLT 274.0   < > 289 296 277   < > =  values in this interval not displayed.   Cardiac Enzymes: No results for input(s): "CKTOTAL", "CKMB", "CKMBINDEX", "TROPONINI" in the last 8760 hours. BNP: Invalid input(s): "POCBNP" Lab Results  Component Value Date   HGBA1C 6.4 08/30/2017   Lab Results  Component Value Date   TSH 1.18 12/13/2017   Lab Results  Component Value Date   VITAMINB12 306 07/29/2022   No results found for: "FOLATE" No results found for: "IRON", "TIBC", "FERRITIN"  Imaging and Procedures obtained prior to SNF admission: DG CHEST PORT 1 VIEW  Result Date: 10/03/2022 CLINICAL DATA:  Follow-up rib fracture EXAM: PORTABLE CHEST 1 VIEW COMPARISON:  CT chest dated 10/02/2022 FINDINGS: Low lung volumes. Bibasilar patchy opacities. Unchanged blunting of the left costophrenic angle. No pneumothorax. Similar cardiomediastinal silhouette. Again seen are displaced left posterior fourth through sixth rib fractures. Nondisplaced seventh rib fracture is not well seen. Partially imaged cervical fixation hardware. IMPRESSION: Unchanged displaced left posterior fourth through sixth rib fractures. Nondisplaced seventh rib fracture is not well seen. No pneumothorax. Electronically Signed   By: Darrin Nipper M.D.   On: 10/03/2022 08:14   CT HEAD WO CONTRAST (5MM)  Result Date: 10/02/2022 CLINICAL DATA:  Follow-up subdural hemorrhage. EXAM: CT HEAD WITHOUT CONTRAST TECHNIQUE: Contiguous axial images were obtained from the base of the skull through the vertex without intravenous contrast. RADIATION DOSE REDUCTION: This exam was performed according to the departmental dose-optimization program which includes automated exposure control, adjustment of the mA and/or kV according to patient size and/or use of iterative reconstruction technique. COMPARISON:  CT scan of brain October 02, 2022 at 4:03 a.m. FINDINGS: Brain: The left-sided subdural hematoma remains, similar in size in the interval. The hematoma measures up to 4 mm in thickness  on series 4, image 24. Posteriorly, there is also left subdural blood on series 4, image 18 measuring 5 mm in thickness on today's study versus 5 mm earlier today. No significant change in the left-sided subdural hematoma to. No other abnormal intracranial blood. No midline shift. Ventricles and sulci are prominent but stable. No blood in the ventricles. Cerebellum, brainstem, and basal cisterns are normal. Significant white matter changes remain. No acute cortical ischemia or infarct. Vascular: No hyperdense vessel or unexpected calcification. Skull: Normal. Negative for fracture or focal lesion. Sinuses/Orbits: No acute finding. Other: Soft tissue swelling in the left periorbital region. IMPRESSION: 1. The left-sided subdural hematoma is stable in the interval. No midline shift.  2. Soft tissue swelling in the left periorbital region. 3. No other acute intracranial abnormalities. Electronically Signed   By: Dorise Bullion III M.D.   On: 10/02/2022 17:34   CT CHEST ABDOMEN PELVIS W CONTRAST  Result Date: 10/02/2022 CLINICAL DATA:  88 year old male history of trauma from a fall. EXAM: CT CHEST, ABDOMEN, AND PELVIS WITH CONTRAST TECHNIQUE: Multidetector CT imaging of the chest, abdomen and pelvis was performed following the standard protocol during bolus administration of intravenous contrast. RADIATION DOSE REDUCTION: This exam was performed according to the departmental dose-optimization program which includes automated exposure control, adjustment of the mA and/or kV according to patient size and/or use of iterative reconstruction technique. CONTRAST:  37m OMNIPAQUE IOHEXOL 350 MG/ML SOLN COMPARISON:  Chest CT 04/10/2019. CT of the abdomen and pelvis 09/17/2016. FINDINGS: CT CHEST FINDINGS Cardiovascular: Heart size is normal. There is no significant pericardial fluid, thickening or pericardial calcification. There is aortic atherosclerosis, as well as atherosclerosis of the great vessels of the mediastinum  and the coronary arteries, including calcified atherosclerotic plaque in the left main, left anterior descending, left circumflex and right coronary arteries. Thickening and calcification of the aortic valve. Severe calcifications of the mitral annulus. 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 Left vertebral artery origin is densely calcified, and may be occluded or nearly completely occluded, although the left vertebral artery is patent distally. Mediastinum/Nodes: No high attenuation fluid collection in the mediastinum to suggest posttraumatic hematoma. No pathologically enlarged mediastinal or hilar lymph nodes. Esophagus is unremarkable in appearance. No axillary lymphadenopathy. Lungs/Pleura: No pneumothorax. There is some focal high attenuation along the posterolateral left chest wall adjacent to several left-sided rib fractures (discussed below), likely to represent some subpleural hemorrhage, measuring up to 1.8 cm in thickness. No freely flowing left pleural fluid collection to suggest hemothorax. No right pleural fluid collection. Patchy ill-defined opacities are noted in the dependent portion of the left lung base, likely areas of subsegmental atelectasis. No confluent consolidative airspace disease. A few scattered tiny pulmonary nodules are noted, largest of which is a subpleural nodule in the periphery of the right middle lobe (axial image 94 of series 5) measuring 4 mm. These are stable compared to prior study from 04/10/2019, considered definitively benign (no imaging follow-up recommended). No larger more suspicious appearing pulmonary nodules or masses are noted. Musculoskeletal: Acute displaced fractures of the left posterior fourth, fifth and sixth ribs are noted. Potential nondisplaced fracture of the posterior left seventh rib also noted. No right-sided rib fractures or aggressive appearing lytic or blastic lesions are noted  elsewhere in the visualized portions of the skeleton. CT ABDOMEN PELVIS FINDINGS Hepatobiliary: No evidence of significant acute traumatic injury to the liver. No suspicious cystic or solid hepatic lesions. No intra or extrahepatic biliary ductal dilatation. Gallbladder is unremarkable in appearance. Pancreas: No evidence of significant acute traumatic injury to the pancreas. No pancreatic mass. No pancreatic ductal dilatation. No pancreatic or peripancreatic fluid collections or inflammatory changes. Spleen: No evidence of significant acute traumatic injury to the spleen. Spleen is normal in appearance. Adrenals/Urinary Tract: No evidence of significant acute traumatic injury to either kidney or adrenal gland. Small cortical calcification in the upper pole of the left kidney, benign in appearance. No aggressive appearing renal lesions. No hydroureteronephrosis. Urinary bladder appears intact and is normal in appearance. Bilateral adrenal glands are normal in appearance. Stomach/Bowel: No evidence of significant acute traumatic injury to the hollow viscera. Mural calcification  in the cardia of the stomach (axial image 58 of series 4) stable compared to prior examinations, presumably benign. No pathologic dilatation of small bowel or colon. Numerous colonic diverticula are noted, particularly in the descending colon and sigmoid colon, without surrounding inflammatory changes to suggest an acute diverticulitis at this time. Normal appendix. Vascular/Lymphatic: No evidence of significant acute traumatic injury to the abdominal aorta or major arteries/veins of the abdomen and pelvis. Extensive atherosclerosis throughout the abdominal aorta and pelvic vasculature, without evidence of aneurysm or dissection. No lymphadenopathy noted in the abdomen or pelvis. Reproductive: Prostate gland and seminal vesicles are unremarkable in appearance. Other: No high attenuation fluid collection in the peritoneal cavity or  retroperitoneum to suggest significant posttraumatic hemorrhage. No significant volume of ascites. No pneumoperitoneum. Musculoskeletal: No acute displaced fractures or aggressive appearing lytic or blastic lesions are noted in the visualized portions of the skeleton. Interbody cages are noted at the L4-L5 and L5-S1 interspaces. IMPRESSION: 1. Acute mildly displaced fractures of the posterior left fourth, fifth and sixth ribs, with probable nondisplaced fracture of the posterior left seventh rib also noted. There is a small amount of subpleural hematoma adjacent to these rib fractures, and some subsegmental atelectasis in the left lung base presumably from splinting. No pneumothorax, hemothorax or acute mediastinal hemorrhage. 2. No evidence of significant acute traumatic injury to the abdomen or pelvis. 3. 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 Clinical correlation for signs and symptoms of subclavian steal syndrome is suggested. 4. There are calcifications of the aortic valve and mitral annulus. Echocardiographic correlation for evaluation of potential valvular dysfunction may be warranted if clinically indicated. 5. Aortic atherosclerosis, in addition to left main and three-vessel coronary artery disease. 6. Colonic diverticulosis without evidence of acute diverticulitis at this time. 7. Additional incidental findings, as above. Electronically Signed   By: Vinnie Langton M.D.   On: 10/02/2022 05:47   DG Elbow 2 Views Left  Result Date: 10/02/2022 CLINICAL DATA:  Level 2 trauma.  Fall on blood thinners. EXAM: LEFT ELBOW - 2 VIEW COMPARISON:  None Available. FINDINGS: There is no evidence of fracture, dislocation, or joint effusion. Atheromatous calcification in the arm. IMPRESSION: Negative for fracture or subluxation. Electronically Signed   By: Jorje Guild M.D.   On:  10/02/2022 04:31   DG Pelvis Portable  Result Date: 10/02/2022 CLINICAL DATA:  Level 2 trauma from ground level fall EXAM: PORTABLE PELVIS 1-2 VIEWS COMPARISON:  None Available. FINDINGS: There is no evidence of pelvic fracture or diastasis. Both hips are located. Advanced lumbar spine degeneration with lower fusions. Atherosclerosis. IMPRESSION: No acute finding. Electronically Signed   By: Jorje Guild M.D.   On: 10/02/2022 04:27   DG Chest Port 1 View  Result Date: 10/02/2022 CLINICAL DATA:  No L2 trauma EXAM: PORTABLE CHEST 1 VIEW COMPARISON:  09/22/2022 FINDINGS: Artifact from EKG leads. Posterior left fourth through sixth rib fractures, interval and displaced. Low volume chest with interstitial crowding at the bases. No air bronchogram, edema, effusion, or pneumothorax. IMPRESSION: 1. Acute left fourth through sixth rib fractures with displacement. No hemothorax or pneumothorax. 2. Low volume chest with presumed atelectasis at the bases. Electronically Signed   By: Jorje Guild M.D.   On: 10/02/2022 04:26   CT HEAD WO CONTRAST  Result Date: 10/02/2022 CLINICAL DATA:  Mechanical fall EXAM: CT HEAD WITHOUT CONTRAST CT CERVICAL SPINE WITHOUT  CONTRAST TECHNIQUE: Multidetector CT imaging of the head and cervical spine was performed following the standard protocol without intravenous contrast. Multiplanar CT image reconstructions of the cervical spine were also generated. RADIATION DOSE REDUCTION: This exam was performed according to the departmental dose-optimization program which includes automated exposure control, adjustment of the mA and/or kV according to patient size and/or use of iterative reconstruction technique. COMPARISON:  09/22/2022 FINDINGS: CT HEAD FINDINGS Brain: High-density subdural hematoma along the lateral left cerebellum measuring up to 4 mm in thickness. Brain atrophy that is pronounced with confluent chronic small vessel ischemia in the deep cerebral white matter. Small  vessel disease likely also involves the brainstem and deep gray nuclei. No acute cortical infarct, mass, or hydrocephalus. Vascular: No hyperdense vessel or unexpected calcification. Skull: Swelling around the left orbit and forehead. No acute fracture Sinuses/Orbits: No visible injury CT CERVICAL SPINE FINDINGS Alignment: No traumatic malalignment Skull base and vertebrae: No acute fracture. ACDF with plate from C4 to C6. 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 Soft tissues and spinal canal: No prevertebral fluid or swelling. No visible canal hematoma. Disc levels:  Diffuse degenerative endplate spurring. Upper chest: No visible injury Critical Value/emergent results were called by telephone at the time of interpretation on 10/02/2022 at 4:21 am to provider Montine Circle , who verbally acknowledged these results. IMPRESSION: 1. Acute subdural hematoma along the lateral left cerebral convexity measuring up to 4 mm in thickness. 2. Negative for cervical spine fracture. 3. Advanced atrophy and chronic small vessel ischemia. Electronically Signed   By: Jorje Guild M.D.   On: 10/02/2022 04:22   CT CERVICAL SPINE WO CONTRAST  Result Date: 10/02/2022 CLINICAL DATA:  Mechanical fall EXAM: CT HEAD WITHOUT CONTRAST CT CERVICAL SPINE WITHOUT CONTRAST TECHNIQUE: Multidetector CT imaging of the head and cervical spine was performed following the standard protocol without intravenous contrast. Multiplanar CT image reconstructions of the cervical spine were also generated. RADIATION DOSE REDUCTION: This exam was performed according to the departmental dose-optimization program which includes automated exposure control, adjustment of the mA and/or kV according to patient size and/or use of iterative reconstruction technique. COMPARISON:  09/22/2022 FINDINGS: CT HEAD FINDINGS Brain: High-density subdural hematoma along the lateral left cerebellum measuring up to 4 mm in thickness. Brain atrophy that is pronounced with  confluent chronic small vessel ischemia in the deep cerebral white matter. Small vessel disease likely also involves the brainstem and deep gray nuclei. No acute cortical infarct, mass, or hydrocephalus. Vascular: No hyperdense vessel or unexpected calcification. Skull: Swelling around the left orbit and forehead. No acute fracture Sinuses/Orbits: No visible injury CT CERVICAL SPINE FINDINGS Alignment: No traumatic malalignment Skull base and vertebrae: No acute fracture. ACDF with plate from C4 to C6. 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 Soft tissues and spinal canal: No prevertebral fluid or swelling. No visible canal hematoma. Disc levels:  Diffuse degenerative endplate spurring. Upper chest: No visible injury Critical Value/emergent results were called by telephone at the time of interpretation on 10/02/2022 at 4:21 am to provider Montine Circle , who verbally acknowledged these results. IMPRESSION: 1. Acute subdural hematoma along the lateral left cerebral convexity measuring up to 4 mm in thickness. 2. Negative for cervical spine fracture. 3. Advanced atrophy and chronic small vessel ischemia. Electronically Signed   By: Jorje Guild M.D.   On: 10/02/2022 04:22    Assessment/Plan 1. Subdural hematoma (HCC) Hold Eliquis for 2 weeks  2. Laceration of forehead, subsequent encounter Healed now  3. 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 Paroxysmal atrial fibrillation (HCC) Off Eliquis for 2 weeks Consider further discussion if has more falls  5. Primary hypertension On Metoprolol  6. Mixed hyperlipidemia Repathat and Statin  7. OSA (obstructive sleep apnea) Doe snto use CPAP anymore  8. Rheumatoid arthritis involving elbow, unspecified laterality, unspecified whether rheumatoid factor present ([place]) On  9 Anemia Will need follow up   Family/ staff Communication:    Labs/tests ordered:

## 2022-10-12 ENCOUNTER — Encounter: Payer: Self-pay | Admitting: Orthopedic Surgery

## 2022-10-12 DIAGNOSIS — S065XAS Traumatic subdural hemorrhage with loss of consciousness status unknown, sequela: Secondary | ICD-10-CM | POA: Diagnosis not present

## 2022-10-12 DIAGNOSIS — Z9181 History of falling: Secondary | ICD-10-CM | POA: Diagnosis not present

## 2022-10-12 DIAGNOSIS — R278 Other lack of coordination: Secondary | ICD-10-CM | POA: Diagnosis not present

## 2022-10-12 DIAGNOSIS — S2242XS Multiple fractures of ribs, left side, sequela: Secondary | ICD-10-CM | POA: Diagnosis not present

## 2022-10-12 DIAGNOSIS — R2689 Other abnormalities of gait and mobility: Secondary | ICD-10-CM | POA: Diagnosis not present

## 2022-10-12 NOTE — Progress Notes (Signed)
This encounter was created in error - please disregard.

## 2022-10-13 DIAGNOSIS — R278 Other lack of coordination: Secondary | ICD-10-CM | POA: Diagnosis not present

## 2022-10-13 DIAGNOSIS — S2242XS Multiple fractures of ribs, left side, sequela: Secondary | ICD-10-CM | POA: Diagnosis not present

## 2022-10-13 DIAGNOSIS — Z9181 History of falling: Secondary | ICD-10-CM | POA: Diagnosis not present

## 2022-10-13 DIAGNOSIS — R2689 Other abnormalities of gait and mobility: Secondary | ICD-10-CM | POA: Diagnosis not present

## 2022-10-13 DIAGNOSIS — S065XAS Traumatic subdural hemorrhage with loss of consciousness status unknown, sequela: Secondary | ICD-10-CM | POA: Diagnosis not present

## 2022-10-14 ENCOUNTER — Non-Acute Institutional Stay (SKILLED_NURSING_FACILITY): Payer: Medicare Other | Admitting: Adult Health

## 2022-10-14 ENCOUNTER — Encounter: Payer: Self-pay | Admitting: Adult Health

## 2022-10-14 DIAGNOSIS — I480 Paroxysmal atrial fibrillation: Secondary | ICD-10-CM | POA: Diagnosis not present

## 2022-10-14 DIAGNOSIS — F02B3 Dementia in other diseases classified elsewhere, moderate, with mood disturbance: Secondary | ICD-10-CM | POA: Diagnosis not present

## 2022-10-14 DIAGNOSIS — G308 Other Alzheimer's disease: Secondary | ICD-10-CM

## 2022-10-14 DIAGNOSIS — E782 Mixed hyperlipidemia: Secondary | ICD-10-CM | POA: Diagnosis not present

## 2022-10-14 DIAGNOSIS — R278 Other lack of coordination: Secondary | ICD-10-CM | POA: Diagnosis not present

## 2022-10-14 DIAGNOSIS — R2689 Other abnormalities of gait and mobility: Secondary | ICD-10-CM | POA: Diagnosis not present

## 2022-10-14 DIAGNOSIS — G4733 Obstructive sleep apnea (adult) (pediatric): Secondary | ICD-10-CM | POA: Diagnosis not present

## 2022-10-14 DIAGNOSIS — S2242XS Multiple fractures of ribs, left side, sequela: Secondary | ICD-10-CM | POA: Diagnosis not present

## 2022-10-14 DIAGNOSIS — S065XAA Traumatic subdural hemorrhage with loss of consciousness status unknown, initial encounter: Secondary | ICD-10-CM | POA: Diagnosis not present

## 2022-10-14 DIAGNOSIS — M069 Rheumatoid arthritis, unspecified: Secondary | ICD-10-CM

## 2022-10-14 DIAGNOSIS — F4321 Adjustment disorder with depressed mood: Secondary | ICD-10-CM

## 2022-10-14 DIAGNOSIS — R296 Repeated falls: Secondary | ICD-10-CM

## 2022-10-14 DIAGNOSIS — S065XAS Traumatic subdural hemorrhage with loss of consciousness status unknown, sequela: Secondary | ICD-10-CM | POA: Diagnosis not present

## 2022-10-14 DIAGNOSIS — I2510 Atherosclerotic heart disease of native coronary artery without angina pectoris: Secondary | ICD-10-CM | POA: Diagnosis not present

## 2022-10-14 DIAGNOSIS — Z9181 History of falling: Secondary | ICD-10-CM | POA: Diagnosis not present

## 2022-10-14 MED ORDER — APIXABAN 2.5 MG PO TABS: 2.5000 mg | ORAL_TABLET | Freq: Two times a day (BID) | ORAL | 1 refills | Status: DC

## 2022-10-14 MED ORDER — ROSUVASTATIN CALCIUM 5 MG PO TABS: 5.0000 mg | ORAL_TABLET | Freq: Every day | ORAL | 3 refills | Status: DC

## 2022-10-14 NOTE — Progress Notes (Signed)
Location:  Occupational psychologist of Service:  SNF (31) Provider:   Cindi Carbon, ANP [place] [phone]   Plotnikov, Evie Lacks, MD  Patient Care Team: Cassandria Anger, MD as PCP - General Sueanne Margarita, MD as PCP - Cardiology (Cardiology) Thompson Grayer, MD as PCP - Electrophysiology (Cardiology) Irene Shipper, MD (Gastroenterology) Thompson Grayer, MD (Cardiology) Hennie Duos, MD as Consulting Physician (Rheumatology) Alexis Frock, MD as Consulting Physician (Urology) Katy Apo, MD as Consulting Physician (Ophthalmology) Mathis Fare (Dentistry) Tiajuana Amass, MD as Referring Physician (Allergy and Immunology) Cameron Sprang, MD as Consulting Physician (Neurology) Charlton Haws, [facility] as Pharmacist (Pharmacist) Cameron Sprang, MD as Consulting Physician (Neurology)  Extended Emergency Contact Information Primary Emergency Contact: Rossie,[name] Address: [place], [place] Phone: [phone] Work Phone: [phone] Mobile Phone: [phone] Relation: Spouse Secondary Emergency Contact: [place] Mobile Phone: [phone] Relation: Daughter  Code Status:  DNR Goals of care: Advanced Directive information    10/12/2022    9:41 AM  Advanced Directives  Does Patient Have a Medical Advance Directive? Yes  Type of Paramedic of [place];Living will  Does patient want to make changes to medical advance directive? No - Patient declined  Copy of [place] in Chart? Yes - validated most recent copy scanned in chart (See row information)     Chief Complaint  Patient presents with   Discharge Note    HPI:  Pt is a 88 y.o. male seen today for evaluation for discharge from [place] rehab.   He was admitted to the hospital 10/02/22-10/05/22 due to a fall which was mechanical and diagnosed with a SDH, left  rib fx 4-7 with displacement and taken off of eliquis. Eliquis was held for two weeks. He also fell on 11/8 and hit his head and went to the ER for laceration repair. Due to the falls he came to rehab and has been working with therapy. He still has some balance issues and judgement issues (forgetting the walker).  He is independent with ADLs. MMSE 18/30 which is a drop from prior admit. He is increasingly forgetful, somewhat paranoid, and easily angered. He was started on zyprexa during his last admit to memory care for a fal on 11/8 which he was taking prn at home prior to the scheduled dosing. No physical aggression. Makes inappropriate comments. His wife is wanting to take care of him at home with home care support.     Has hx of afib on baby asa. Also has hx of CAD with PCI, NSTEMI.  GERD on PPI  Hx of sleep apnea, no longer using cpap  RA on Orencia, follows with Rheumatology HLD on Repatha, zetia, crestor Hx of depression on lexapro.   During his stay in rehab he did have some constipation which is now relieved with MOM and colace   For the rib fractures he is using tylenol and lidocaine patches. Pain is improved. No difficulty breathing or cough.  CT of head reviewed 10/02/22, repeat stable 10/02/22 IMPRESSION: 1. Acute subdural hematoma along the lateral left cerebral convexity measuring up to 4 mm in thickness. 2. Negative for cervical spine fracture. 3. Advanced atrophy and chronic small vessel ischemia. Past Medical History:  Diagnosis Date   CAD (coronary artery disease)    a. s/p inferior STEMI 01/08/2014; LHC 01/08/14: total RCA occlusion s/p 3.5x44m  Xience DES distal RCA and 3.5x28 mm DES mid RCA, 60-70% mid LAD stenosis, EF 55%.   Complication of anesthesia    'long to wake up after back surgery " 09/2003   Diverticulosis of colon    GERD (gastroesophageal reflux disease)    History of cellulitis    05-26-2015  LLE   History of colon polyps    1998- benign/  2008  adenomatous    History of kidney stones    2013   History of squamous cell carcinoma excision    2013;  2015;  06-12-2015 right leg/  02/ and 05/ 2017  left ear and left leg   Hydronephrosis, left    Hyperlipidemia    Hypertension    Migraine with aura    OSA on CPAP 06/19/2015   Moderate OSA with AHI 18/hr  per study 05-20-2015   Osteoarthritis    Paroxysmal atrial fibrillation ([place]) 4/16   chads2vasc score is at least 4   Premature atrial contractions    RA (rheumatoid arthritis) [facility])    rheumatologist-  dr Leigh Aurora   Sinusitis, chronic 10/17/2015   Wears glasses    Wears hearing aid    bilateral   Past Surgical History:  Procedure Laterality Date   BACK SURGERY     CARDIOVASCULAR STRESS TEST  11/21/2015   Low risk nuclear study w/ a small diaphragmatic attenuation artifact, no ischemia/  normal LV function and wall motion , ef 63%   CATARACT EXTRACTION W/ INTRAOCULAR LENS  IMPLANT, BILATERAL  2015   COLONOSCOPY  last one 06-09-2011   CORONARY ANGIOPLASTY     CYSTOSCOPY W/ RETROGRADES Left 07/12/2016   Procedure: CYSTOSCOPY WITH RETROGRADE PYELOGRAM LEFT URETERAL STENT;  Surgeon: Irine Seal, MD;  Location: [facility];  Service: Urology;  Laterality: Left;   CYSTOSCOPY WITH RETROGRADE PYELOGRAM, URETEROSCOPY AND STENT PLACEMENT Left 08/11/2016   Procedure: CYSTOSCOPY WITH RETROGRADE PYELOGRAM,  DIAGNOSTIC URETEROSCOPY , STENT EXCHANGE;  Surgeon: Alexis Frock, MD;  Location: [facility];  Service: Urology;  Laterality: Left;   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   severe fibromatosis palm and fingers   LEFT HEART CATHETERIZATION WITH CORONARY ANGIOGRAM N/A 01/08/2014   Procedure: LEFT HEART CATHETERIZATION WITH CORONARY ANGIOGRAM;  Surgeon: Troy Sine, MD;  Location: [facility] CATH LAB;  Service: Cardiovascular;  Laterality:N/A;  total/ subtotal RCA/  mLAD 02-72% w/ mid systolic bridging/  preserved global LVF, ef 55%   MOHS SURGERY  x2  feb and may 2017    left ear /  left leg  ([facility])   PERCUTANEOUS CORONARY STENT INTERVENTION (PCI-S)  01/08/2014   Procedure: PERCUTANEOUS CORONARY STENT INTERVENTION (PCI-S);  Surgeon: Troy Sine, MD;  Location: [facility] LAB;  Service: Cardiovascular;;  DES to mid and distal RCA   POSTERIOR LUMBAR FUSION  10/01/2003   and Laminectomy/ diskectomy  L4 -- S1   ROBOT ASSISTED PYELOPLASTY Left 09/15/2016   Procedure: XI ROBOTIC ASSISTED PYELOPLASTY;  Surgeon: Alexis Frock, MD;  Location: [facility];  Service: Urology;  Laterality: Left;   TONSILLECTOMY     TRANSTHORACIC ECHOCARDIOGRAM  02/23/2016   mild LVH,  ef 50-55%,  grade 1 diastolic dysfunction/  mild to moderate AV calcification w/ no stenosis or regurg./  trivial MR and TR/  mild PR    Allergies  Allergen Reactions   Methotrexate Other (See Comments)    REACTION: tachycardia   Aricept [Donepezil Hcl]     nightmares    Crestor [Rosuvastatin Calcium]  Myalgias with '20mg'$  dose   Lisinopril Other (See Comments)    cough   Namenda [Memantine]     n/d   Atorvastatin Other (See Comments)    Muscle pain, leg cramps   Pravastatin Sodium Other (See Comments)    REACTION: cramps, fatigue    Outpatient Encounter Medications as of 10/14/2022  Medication Sig   abatacept (ORENCIA) 250 MG injection every 30 (thirty) days.   acetaminophen (TYLENOL) 500 MG tablet Take 1,000 mg by mouth 2 (two) times daily. Scheduled and 2 by mouth as needed   aspirin EC 81 MG tablet Take 81 mg by mouth daily. Swallow whole.   docusate sodium (COLACE) 100 MG capsule Take 1 capsule (100 mg total) by mouth 2 (two) times daily.   donepezil (ARICEPT) 5 MG tablet Take 5 mg by mouth at bedtime.   escitalopram (LEXAPRO) 20 MG tablet Take 20 mg by mouth daily.   Evolocumab (REPATHA SURECLICK) 354 MG/ML SOAJ INJECT 1 PEN INTO SKIN EVERY 14 DAYS   ezetimibe (ZETIA) 10 MG tablet Take 5 mg by mouth daily.   gabapentin (NEURONTIN) 300 MG capsule Take 1 capsule (300 mg total) by mouth 3  (three) times daily.   lidocaine 4 % Place 1 patch onto the skin 2 (two) times daily. Left side x 30 days   LORazepam (ATIVAN) 0.5 MG tablet Take 0.5 mg by mouth 2 (two) times daily as needed for anxiety.   magnesium hydroxide (MILK OF MAGNESIA) 400 MG/5ML suspension Take 45 mLs by mouth as needed for mild constipation.   memantine (NAMENDA) 10 MG tablet Take 10 mg by mouth at bedtime.   metoprolol tartrate (LOPRESSOR) 25 MG tablet Take 25 mg by mouth at bedtime.   nitroGLYCERIN (NITROSTAT) 0.4 MG SL tablet Place 0.4 mg under the tongue every 5 (five) minutes as needed for chest pain.   OLANZapine (ZYPREXA) 2.5 MG tablet Take 2.5 mg by mouth at bedtime.   omeprazole (PRILOSEC) 40 MG capsule Take 40 mg by mouth daily. Take on empty stomach 30 minutes before breakfast.   oxyCODONE (OXY IR/ROXICODONE) 5 MG immediate release tablet Take 1 tablet (5 mg total) by mouth every 6 (six) hours as needed for moderate pain.   polyethylene glycol (MIRALAX / GLYCOLAX) 17 g packet Take 17 g by mouth as needed for mild constipation.   No facility-administered encounter medications on file as of 10/14/2022.    Review of Systems  Constitutional:  Negative for activity change, appetite change, chills, diaphoresis, fatigue, fever and unexpected weight change.  HENT:  Negative for congestion.   Eyes:  Negative for photophobia, pain, discharge, redness, itching and visual disturbance.  Respiratory:  Negative for cough, shortness of breath, wheezing and stridor.   Cardiovascular:  Negative for chest pain, palpitations and leg swelling.  Gastrointestinal:  Negative for abdominal distention, abdominal pain, constipation and diarrhea.  Genitourinary:  Negative for difficulty urinating and dysuria.  Musculoskeletal:  Positive for arthralgias and gait problem. Negative for back pain, joint swelling and myalgias.  Skin:  Negative for rash.  Neurological:  Negative for dizziness, seizures, syncope, facial asymmetry,  speech difficulty, weakness and headaches.  Hematological:  Negative for adenopathy. Does not bruise/bleed easily.  Psychiatric/Behavioral:  Positive for agitation, behavioral problems and confusion.     Immunization History  Administered Date(s) Administered   Fluad Quad(high Dose 65+) 07/09/2019, 08/15/2020, 09/16/2021, 07/29/2022   Influenza Split 08/10/2011, 08/15/2012   Influenza Whole 08/21/2009, 09/22/2010   Influenza, High Dose Seasonal PF 09/16/2015, 08/18/2016, 08/31/2017,  08/14/2018   Influenza,inj,Quad PF,6+ Mos 08/06/2013   Influenza-Unspecified 08/31/2017   PFIZER(Purple Top)SARS-COV-2 Vaccination 12/11/2019, 01/01/2020, 08/12/2020   Pneumococcal Conjugate-13 10/02/2013   Pneumococcal Polysaccharide-23 06/04/2010   Tdap 10/22/2011, 09/22/2022   Zoster Recombinat (Shingrix) 03/09/2017, 05/10/2017   Pertinent  Health Maintenance Due  Topic Date Due   INFLUENZA VACCINE  Completed      10/04/2022    8:32 AM 10/04/2022    7:37 PM 10/05/2022    7:58 AM 10/05/2022    3:40 PM 10/12/2022    9:31 AM  Fall Risk  Falls in the past year?    1 1  Was there an injury with Fall?    1 1  Fall Risk Category Calculator    3 3  Fall Risk Category    High High  Patient Fall Risk Level High fall risk High fall risk High fall risk High fall risk High fall risk  Patient at Risk for Falls Due to    History of fall(s);Impaired balance/gait History of fall(s);Impaired balance/gait;Impaired mobility  Fall risk Follow up    Falls evaluation completed Falls evaluation completed   Functional Status Survey:    Vitals:   10/14/22 1553  BP: 130/60  Pulse: 64  Resp: 20  Temp: 97.7 F (36.5 C)  SpO2: 94%   There is no height or weight on file to calculate BMI. Physical Exam Vitals reviewed.  Constitutional:      General: He is not in acute distress.    Appearance: He is not diaphoretic.  HENT:     Head:     Comments: Healed forehead laceration. Abrasion healed and old bruising to  left eye.     Nose: Nose normal.     Mouth/Throat:     Mouth: Mucous membranes are moist.     Pharynx: Oropharynx is clear.  Eyes:     General:        Right eye: No discharge.        Left eye: No discharge.     Extraocular Movements: Extraocular movements intact.     Conjunctiva/sclera: Conjunctivae normal.     Pupils: Pupils are equal, round, and reactive to light.  Neck:     Thyroid: No thyromegaly.     Vascular: No JVD.     Trachea: No tracheal deviation.  Cardiovascular:     Rate and Rhythm: Normal rate and regular rhythm.     Heart sounds: No murmur heard. Pulmonary:     Effort: Pulmonary effort is normal. No respiratory distress.     Breath sounds: Normal breath sounds. No wheezing.  Abdominal:     General: Bowel sounds are normal. There is no distension.     Palpations: Abdomen is soft.     Tenderness: There is no abdominal tenderness.  Lymphadenopathy:     Cervical: No cervical adenopathy.  Skin:    General: Skin is warm and dry.  Neurological:     General: No focal deficit present.     Mental Status: He is alert and oriented to person, place, and time. Mental status is at baseline.     Cranial Nerves: No cranial nerve deficit.     Gait: Gait normal.  Psychiatric:        Mood and Affect: Mood normal.    Labs reviewed: Recent Labs    10/03/22 0310 10/04/22 0552 10/05/22 0243 10/11/22 0600  NA 138 142 139 143  K 4.0 3.8 3.8 4.1  CL 104 106 107 107  CO2 23  26 23 25*  GLUCOSE 96 97 109*  --   BUN '12 12 14 16  '$ CREATININE 0.91 0.95 0.93 0.9  CALCIUM 8.9 8.8* 8.7* 8.8    Recent Labs    07/29/22 1345 10/02/22 0350 10/11/22 0600  AST '17 24 21  '$ ALT '13 16 13  '$ ALKPHOS 58 61 89  BILITOT 0.5 0.6  --   PROT 7.7 7.0  --   ALBUMIN 4.1 4.0 3.7    Recent Labs    07/29/22 1345 09/24/22 0000 10/03/22 0310 10/04/22 0552 10/05/22 0243 10/11/22 0600  WBC 5.3   < > 7.2 6.1 6.4 5.4  NEUTROABS 2.7  --   --   --   --   --   HGB 12.8*   < > 12.4* 11.3* 10.8*  10.8*  HCT 38.2*   < > 36.6* 33.7* 32.2* 32*  MCV 99.3   < > 100.3* 98.5 98.8  --   PLT 274.0   < > 289 296 277  --    < > = values in this interval not displayed.    Lab Results  Component Value Date   TSH 1.18 12/13/2017   Lab Results  Component Value Date   HGBA1C 6.4 08/30/2017   Lab Results  Component Value Date   CHOL 197 08/04/2020   HDL 47 08/04/2020   LDLCALC 106 (H) 08/04/2020   LDLDIRECT 168.9 05/24/2013   TRIG 254 (H) 08/04/2020   CHOLHDL 4.2 08/04/2020    Significant Diagnostic Results in last 30 days:  DG Chest 2 View  Result Date: 09/22/2022 CLINICAL DATA:  Chest and rib pain after a fall. EXAM: CHEST - 2 VIEW COMPARISON:  02/15/2021 FINDINGS: 0509 hours. Low volume film. Cardiopericardial silhouette is at upper limits of normal for size. The lungs are clear without focal pneumonia, edema, pneumothorax or pleural effusion. The visualized bony structures of the thorax are unremarkable. Telemetry leads overlie the chest. IMPRESSION: Low volume film without acute cardiopulmonary findings. Electronically Signed   By: Misty Stanley M.D.   On: 09/22/2022 05:41   CT Cervical Spine Wo Contrast  Result Date: 09/22/2022 CLINICAL DATA:  88 year old male status post fall while going to the bathroom. Prior cervical fusion. EXAM: CT CERVICAL SPINE WITHOUT CONTRAST TECHNIQUE: Multidetector CT imaging of the cervical spine was performed without intravenous contrast. Multiplanar CT image reconstructions were also generated. RADIATION DOSE REDUCTION: This exam was performed according to the departmental dose-optimization program which includes automated exposure control, adjustment of the mA and/or kV according to patient size and/or use of iterative reconstruction technique. COMPARISON:  Cervical spine MRI 07/04/2019. Preoperative cervical spine radiographs 04/04/2017. FINDINGS: Alignment: Chronic straightening and mild reversal of cervical lordosis is stable. Mild chronic  anterolisthesis of C7 on T1 is stable. Bilateral posterior element alignment is within normal limits. Skull base and vertebrae: Visualized skull base is intact. No atlanto-occipital dissociation. C1 and C2 appear intact and aligned, with a degenerative subchondral cyst at the left base of the odontoid which is new since 2020. Postoperative details are below. No acute osseous abnormality identified. Soft tissues and spinal canal: No prevertebral fluid or swelling. No visible canal hematoma. Negative noncontrast neck soft tissues aside from calcified carotid atherosclerosis. Disc levels: Chronic C4-C5 and C5-C6 ACDF with solid arthrodesis and no hardware loosening. Acquired adjacent segment ankylosis at C3-C4, and also C6-C7. Superimposed degenerative spondylolisthesis at both the C2-C3 and C7-T1 segments which are adjacent to those. Facet arthropathy at both levels. However, capacious spinal canal is demonstrated  by MRI in 2020. Doubt spinal stenosis. Upper chest: Mild chronic T1 and T2 endplate deformities were present on the 2020 MRI. Other visible upper thoracic levels appear grossly intact. Calcified aortic atherosclerosis. Negative lung apices. IMPRESSION: 1. No acute traumatic injury identified in the cervical spine. 2. Chronic C4 through C6 ACDF with solid arthrodesis, and additional acquired ankylosis of both adjacent segments at C3-C4 and C6-C7. Subsequent C2-C3 and C7-T1 degeneration including spondylolisthesis and facet arthropathy. 3.  Aortic Atherosclerosis (ICD10-I70.0). Electronically Signed   By: Genevie Ann M.D.   On: 09/22/2022 04:19   CT Head Wo Contrast  Result Date: 09/22/2022 CLINICAL DATA:  88 year old male status post fall while going to the bathroom. EXAM: CT HEAD WITHOUT CONTRAST TECHNIQUE: Contiguous axial images were obtained from the base of the skull through the vertex without intravenous contrast. RADIATION DOSE REDUCTION: This exam was performed according to the departmental  dose-optimization program which includes automated exposure control, adjustment of the mA and/or kV according to patient size and/or use of iterative reconstruction technique. COMPARISON:  Cervical spine CT today.  Head CT 11/10/2021. FINDINGS: Brain: Stable cerebral volume since last year. Ex vacuo appearing ventricular enlargement with confluent bilateral cerebral white matter hypodensity superimposed. Chronic mixed density but predominantly low-density mass at the central skull base is intra sellar with suprasellar extension to the level of the optic chiasm. This is most apparent on sagittal image 32, up to 17 mm long axis, and unchanged since 04/10/2019. No other intracranial mass identified. No midline shift, mass effect, intracranial hemorrhage or evidence of cortically based acute infarction. Stable gray-white matter differentiation throughout the brain. Vascular: Calcified atherosclerosis at the skull base. No suspicious intracranial vascular hyperdensity. Skull: Stable and intact. Sinuses/Orbits: Visualized paranasal sinuses and mastoids are stable and well aerated. Other: Forehead scalp soft tissue laceration and deficiency with mild regional hematoma and/or contusion affecting the forehead to the right of midline. Underlying frontal sinuses and frontal bones appear intact. Orbits soft tissues remain within normal limits. IMPRESSION: 1. Forehead scalp soft tissue injury without underlying skull fracture. 2. No acute intracranial abnormality. Chronic cerebral white matter disease. And chronic midline intra-sellar/suprasellar mass which abuts the optic chiasm, but has not significantly changed by CT since 04/10/2019. Favor a chronic Pituitary Macroadenoma. Electronically Signed   By: Genevie Ann M.D.   On: 09/22/2022 04:15    Assessment/Plan  1. Subdural hematoma ([place]) Neuro exam is normal Does have worsening memory issues, poor safety awareness over time.  Only on baby asa due to SDH Discussed with Dr  Lyndel Safe. Due to risk of CVA will resume Eliquis at lower dose 2.5 mg on 12/4 (held for two weeks). He will f/u with [place] in clinic upon discharge. Staff and family trying to work out home care for discharge.   2. Falls frequently Due to progressive dementia, safety awareness.   3. Paroxysmal atrial fibrillation (HCC) Rate is controlled with beta blocker.   4. Coronary artery disease involving native coronary artery of native heart without angina pectoris Hx of MI On repatha, zetia, crestor  and asa   5. OSA (obstructive sleep apnea) No longer using cpap  6. Moderate Alzheimer's dementia of other onset with mood disturbance (HCC) Currently on aricept and namenda.  Zyprexa, may need dose increase at next visit.  He would benefit from moving to a higher level of care in the future   7. Rheumatoid arthritis involving elbow, unspecified laterality, unspecified whether rheumatoid factor present ([place]) Followed by rheumatology   8. Situational  depression Currently on lexapro   9. Mixed hyperlipidemia Lab Results  Component Value Date   LDLCALC 106 (H) 08/04/2020   On zetia, repatha, crestor  10. Rib fractures Healing  WIll be on lidocaine patches until pain resolves which seem to help.

## 2022-10-15 DIAGNOSIS — S065XAS Traumatic subdural hemorrhage with loss of consciousness status unknown, sequela: Secondary | ICD-10-CM | POA: Diagnosis not present

## 2022-10-15 DIAGNOSIS — R2689 Other abnormalities of gait and mobility: Secondary | ICD-10-CM | POA: Diagnosis not present

## 2022-10-15 DIAGNOSIS — Z9181 History of falling: Secondary | ICD-10-CM | POA: Diagnosis not present

## 2022-10-15 DIAGNOSIS — R278 Other lack of coordination: Secondary | ICD-10-CM | POA: Diagnosis not present

## 2022-10-15 DIAGNOSIS — S2242XS Multiple fractures of ribs, left side, sequela: Secondary | ICD-10-CM | POA: Diagnosis not present

## 2022-10-18 DIAGNOSIS — Z9181 History of falling: Secondary | ICD-10-CM | POA: Diagnosis not present

## 2022-10-18 DIAGNOSIS — S065XAS Traumatic subdural hemorrhage with loss of consciousness status unknown, sequela: Secondary | ICD-10-CM | POA: Diagnosis not present

## 2022-10-18 DIAGNOSIS — S2242XS Multiple fractures of ribs, left side, sequela: Secondary | ICD-10-CM | POA: Diagnosis not present

## 2022-10-18 DIAGNOSIS — R278 Other lack of coordination: Secondary | ICD-10-CM | POA: Diagnosis not present

## 2022-10-18 DIAGNOSIS — R2689 Other abnormalities of gait and mobility: Secondary | ICD-10-CM | POA: Diagnosis not present

## 2022-10-19 ENCOUNTER — Encounter: Payer: Medicare Other | Admitting: Internal Medicine

## 2022-10-19 DIAGNOSIS — Z9181 History of falling: Secondary | ICD-10-CM | POA: Diagnosis not present

## 2022-10-19 DIAGNOSIS — R2689 Other abnormalities of gait and mobility: Secondary | ICD-10-CM | POA: Diagnosis not present

## 2022-10-19 DIAGNOSIS — S2242XS Multiple fractures of ribs, left side, sequela: Secondary | ICD-10-CM | POA: Diagnosis not present

## 2022-10-19 DIAGNOSIS — M0609 Rheumatoid arthritis without rheumatoid factor, multiple sites: Secondary | ICD-10-CM | POA: Diagnosis not present

## 2022-10-19 DIAGNOSIS — S065XAS Traumatic subdural hemorrhage with loss of consciousness status unknown, sequela: Secondary | ICD-10-CM | POA: Diagnosis not present

## 2022-10-19 DIAGNOSIS — R278 Other lack of coordination: Secondary | ICD-10-CM | POA: Diagnosis not present

## 2022-10-20 DIAGNOSIS — S065XAS Traumatic subdural hemorrhage with loss of consciousness status unknown, sequela: Secondary | ICD-10-CM | POA: Diagnosis not present

## 2022-10-20 DIAGNOSIS — R278 Other lack of coordination: Secondary | ICD-10-CM | POA: Diagnosis not present

## 2022-10-20 DIAGNOSIS — R2689 Other abnormalities of gait and mobility: Secondary | ICD-10-CM | POA: Diagnosis not present

## 2022-10-20 DIAGNOSIS — S2242XS Multiple fractures of ribs, left side, sequela: Secondary | ICD-10-CM | POA: Diagnosis not present

## 2022-10-20 DIAGNOSIS — Z9181 History of falling: Secondary | ICD-10-CM | POA: Diagnosis not present

## 2022-10-21 DIAGNOSIS — S2242XS Multiple fractures of ribs, left side, sequela: Secondary | ICD-10-CM | POA: Diagnosis not present

## 2022-10-21 DIAGNOSIS — S065XAS Traumatic subdural hemorrhage with loss of consciousness status unknown, sequela: Secondary | ICD-10-CM | POA: Diagnosis not present

## 2022-10-21 DIAGNOSIS — R278 Other lack of coordination: Secondary | ICD-10-CM | POA: Diagnosis not present

## 2022-10-21 DIAGNOSIS — Z9181 History of falling: Secondary | ICD-10-CM | POA: Diagnosis not present

## 2022-10-21 DIAGNOSIS — R2689 Other abnormalities of gait and mobility: Secondary | ICD-10-CM | POA: Diagnosis not present

## 2022-10-22 DIAGNOSIS — S2242XS Multiple fractures of ribs, left side, sequela: Secondary | ICD-10-CM | POA: Diagnosis not present

## 2022-10-22 DIAGNOSIS — R2689 Other abnormalities of gait and mobility: Secondary | ICD-10-CM | POA: Diagnosis not present

## 2022-10-22 DIAGNOSIS — S065XAS Traumatic subdural hemorrhage with loss of consciousness status unknown, sequela: Secondary | ICD-10-CM | POA: Diagnosis not present

## 2022-10-22 DIAGNOSIS — R278 Other lack of coordination: Secondary | ICD-10-CM | POA: Diagnosis not present

## 2022-10-22 DIAGNOSIS — Z9181 History of falling: Secondary | ICD-10-CM | POA: Diagnosis not present

## 2022-10-25 DIAGNOSIS — Z9181 History of falling: Secondary | ICD-10-CM | POA: Diagnosis not present

## 2022-10-25 DIAGNOSIS — S2242XS Multiple fractures of ribs, left side, sequela: Secondary | ICD-10-CM | POA: Diagnosis not present

## 2022-10-25 DIAGNOSIS — S065XAS Traumatic subdural hemorrhage with loss of consciousness status unknown, sequela: Secondary | ICD-10-CM | POA: Diagnosis not present

## 2022-10-25 DIAGNOSIS — R278 Other lack of coordination: Secondary | ICD-10-CM | POA: Diagnosis not present

## 2022-10-25 DIAGNOSIS — R2689 Other abnormalities of gait and mobility: Secondary | ICD-10-CM | POA: Diagnosis not present

## 2022-10-27 DIAGNOSIS — Z9181 History of falling: Secondary | ICD-10-CM | POA: Diagnosis not present

## 2022-10-27 DIAGNOSIS — R2689 Other abnormalities of gait and mobility: Secondary | ICD-10-CM | POA: Diagnosis not present

## 2022-10-27 DIAGNOSIS — S2242XS Multiple fractures of ribs, left side, sequela: Secondary | ICD-10-CM | POA: Diagnosis not present

## 2022-10-27 DIAGNOSIS — S065XAS Traumatic subdural hemorrhage with loss of consciousness status unknown, sequela: Secondary | ICD-10-CM | POA: Diagnosis not present

## 2022-10-27 DIAGNOSIS — R278 Other lack of coordination: Secondary | ICD-10-CM | POA: Diagnosis not present

## 2022-10-29 DIAGNOSIS — Z9181 History of falling: Secondary | ICD-10-CM | POA: Diagnosis not present

## 2022-10-29 DIAGNOSIS — R2689 Other abnormalities of gait and mobility: Secondary | ICD-10-CM | POA: Diagnosis not present

## 2022-10-29 DIAGNOSIS — S2242XS Multiple fractures of ribs, left side, sequela: Secondary | ICD-10-CM | POA: Diagnosis not present

## 2022-10-29 DIAGNOSIS — R278 Other lack of coordination: Secondary | ICD-10-CM | POA: Diagnosis not present

## 2022-10-29 DIAGNOSIS — S065XAS Traumatic subdural hemorrhage with loss of consciousness status unknown, sequela: Secondary | ICD-10-CM | POA: Diagnosis not present

## 2022-11-01 ENCOUNTER — Encounter: Payer: Self-pay | Admitting: Physician Assistant

## 2022-11-01 ENCOUNTER — Ambulatory Visit (INDEPENDENT_AMBULATORY_CARE_PROVIDER_SITE_OTHER): Payer: Medicare Other | Admitting: Physician Assistant

## 2022-11-01 VITALS — BP 126/77 | HR 83 | Resp 18 | Ht 73.0 in | Wt 193.0 lb

## 2022-11-01 DIAGNOSIS — F0150 Vascular dementia without behavioral disturbance: Secondary | ICD-10-CM

## 2022-11-01 DIAGNOSIS — S2242XS Multiple fractures of ribs, left side, sequela: Secondary | ICD-10-CM | POA: Diagnosis not present

## 2022-11-01 DIAGNOSIS — S065XAS Traumatic subdural hemorrhage with loss of consciousness status unknown, sequela: Secondary | ICD-10-CM | POA: Diagnosis not present

## 2022-11-01 DIAGNOSIS — I2510 Atherosclerotic heart disease of native coronary artery without angina pectoris: Secondary | ICD-10-CM

## 2022-11-01 DIAGNOSIS — R2689 Other abnormalities of gait and mobility: Secondary | ICD-10-CM | POA: Diagnosis not present

## 2022-11-01 DIAGNOSIS — R278 Other lack of coordination: Secondary | ICD-10-CM | POA: Diagnosis not present

## 2022-11-01 DIAGNOSIS — Z9181 History of falling: Secondary | ICD-10-CM | POA: Diagnosis not present

## 2022-11-01 NOTE — Progress Notes (Signed)
Assessment/Plan:   Dementia likely due to Alzheimer's disease and vascular etiology  [name] is a very pleasant 88 y.o. 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  He is currently on donepezil 5 mg daily (he could not tolerate the full dose) as well as  memantine 10 mg daily also due to side effects, seen today in follow up for memory loss.  For mood he is on Lexapro by PCP.  Overall his memory is stable. His MMSE today is 22/30.   Recommendations:  Follow up in 6  months. Continue donepezil 5 mg daily and memantine 10 mg daily Control cardiovascular risk factors Mood control as per PCP, continue Lexapro    Subjective:    This patient is accompanied in the office by his wife who supplements the history.  Previous records as well as any outside records available were reviewed prior to todays visit.  He was last seen at our office on 04/05/2022.  Last MMSE was 17/30 on November 2022.   Any changes in memory since last visit? "Fairly the same, basically I can remember the conversation especially with someone I care about, same with the names of people I know. Meeting new people is difficult for me". He does not do crossword puzzles "I should ".  He likes to participate in  [place] activities. Repeats oneself?  Endorsed, especially with appointments. " If we are going to dinner he may ask again who are we going for dinner with"-wife says.  Disoriented when walking into a room?  Patient denies except occasionally not remembering what patient came to the room for.   Leaving objects in unusual places?  Patient denies   Wandering behavior?  Patient denies   Any personality changes since last visit?  "Gets short with me sometimes, I think I hover"-wife says  Any worsening depression?:  Patient denies   Hallucinations or paranoia?  Patient denies   Seizures?    Patient denies    Any sleep changes?  Denies vivid dreams, REM behavior or sleepwalking   Sleep apnea?  Patient denies   Any hygiene concerns?  Patient denies   Independent of bathing and dressing?  Endorsed . He has a Therapist, art on a regular basis  Does the patient needs help with medications?  Wife is in charge  Who is in charge of the finances?  Wife is in charge    Any changes in appetite?  Patient denies    Patient have trouble swallowing? Patient denies   Does the patient cook?Denies  Any headaches?  Patient denies   Chronic back pain Patient denies   Ambulates with difficulty?.  He enjoys walking his dog. Recent falls or head injuries?  Endorsed.  He had 2 major falls, one in early November, and then another on October 05, 2022, mechanical , sustaining left-sided rib fractures, and subdural hematoma, cleared by neurology, requiring reversal of Eliquis and advised by neurology to strongly consider not resuming it.  He was discharged to Rehab at [facility] then to home.     Unilateral weakness, numbness or tingling?  Patient denies   Any tremors?  Patient denies   Any anosmia?  Patient denies   Any incontinence of urine?  Patient denies   Any bowel dysfunction?   Patient denies      Patient lives  with [name] at [facility]  Does the  patient drive?  He no longer drives, wife does.   History on Initial Assessment 04/11/2019: This is an 88 year old right-handed man with a history of hypertension, hyperlipidemia, OSA on CPAP, CAD, presenting for evaluation of worsening memory. He feels his memory is okay, he gets confused some, "my wife tells me I forget things." He makes jokes several times during the visit. His wife started noticing changes the beginning of the year. He started having personality changes where he would just explode for no reason. He was started on Lexapro which has significantly helped. She has noticed short term memory issues, and was missing bills that she had to take  over in January. He was also starting to miss medications, his wife has started helping. Last night he was very concerned that he had not taken his medications. He has gotten lost driving, last week he could not remember what errand he went to do and was driving around. He could not remember where he would be going, but then figures it out. He repeatedly says he misplaced his hearing aids. His wife was driving yesterday and they brought her car to be serviced, after going to another place, he could not remember that they had brought her car in earlier. He had an unusual episode last week when he came upstairs and told his wife that they had to go and leave now, he was dressed in 2 pairs of boxer shorts and his shirt on backward (unusual, he is usually a fastidious dresser per wife), ready to go somewhere late at night. She found that he had pulled out his clothes and medications and wrapped his medications in his khaki pants. She directed him to bed and he went to sleep. He saw his PCP and had normal bloodwork and urinalysis. I personally reviewed head CT without contrast done 5/26 which did not show any acute changes, there was diffuse atrophy and chronic microvascular disease. His wife notes he is much more alert in the daytime and more confused at night. He was started on Donepezil 5mg  daily, which he is tolerating without side effects. There is no family history of dementia, no history of significant head injuries. He usually has 1-2 alcohol drinks at night.    He denies any headaches, dizziness (except upon standing quickly), diplopia, dysarthria/dysphagia, neck/back pain, focal numbness/tingling/weakness, bowel dysfunction, anosmia. He has infrequent incontinence. He has had right hand shaking occasionally around 3-4 times a week. He has a history of sleepwalking. His wife has not noticed any staring/unresponsive episodes. No paranoia or hallucinations. He has trouble sleeping and takes 1/2 tablet of Ambien.  He has fallen a couple of times, last fall was 3 weeks ago. PREVIOUS MEDICATIONS:   CURRENT MEDICATIONS:  Outpatient Encounter Medications as of 11/01/2022  Medication Sig   abatacept (ORENCIA) 250 MG injection every 30 (thirty) days.   acetaminophen (TYLENOL) 500 MG tablet Take 1,000 mg by mouth 2 (two) times daily. Scheduled and 2 by mouth as needed   apixaban (ELIQUIS) 2.5 MG TABS tablet Take 1 tablet (2.5 mg total) by mouth 2 (two) times daily.   aspirin EC 81 MG tablet Take 81 mg by mouth daily. Swallow whole.   docusate sodium (COLACE) 100 MG capsule Take 1 capsule (100 mg total) by mouth 2 (two) times daily.   donepezil (ARICEPT) 5 MG tablet Take 5 mg by mouth at bedtime.   escitalopram (LEXAPRO) 20 MG tablet Take 20 mg by mouth daily.   Evolocumab (REPATHA SURECLICK) 140 MG/ML  SOAJ INJECT 1 PEN INTO SKIN EVERY 14 DAYS   ezetimibe (ZETIA) 10 MG tablet Take 5 mg by mouth daily.   gabapentin (NEURONTIN) 300 MG capsule Take 1 capsule (300 mg total) by mouth 3 (three) times daily.   lidocaine 4 % Place 1 patch onto the skin 2 (two) times daily. Left side x 30 days   LORazepam (ATIVAN) 0.5 MG tablet Take 0.5 mg by mouth 2 (two) times daily as needed for anxiety.   magnesium hydroxide (MILK OF MAGNESIA) 400 MG/5ML suspension Take 45 mLs by mouth as needed for mild constipation.   memantine (NAMENDA) 10 MG tablet Take 10 mg by mouth at bedtime.   metoprolol tartrate (LOPRESSOR) 25 MG tablet Take 25 mg by mouth at bedtime.   nitroGLYCERIN (NITROSTAT) 0.4 MG SL tablet Place 0.4 mg under the tongue every 5 (five) minutes as needed for chest pain.   OLANZapine (ZYPREXA) 2.5 MG tablet Take 2.5 mg by mouth at bedtime.   omeprazole (PRILOSEC) 40 MG capsule Take 40 mg by mouth daily. Take on empty stomach 30 minutes before breakfast.   polyethylene glycol (MIRALAX / GLYCOLAX) 17 g packet Take 17 g by mouth as needed for mild constipation.   rosuvastatin (CRESTOR) 5 MG tablet Take 1 tablet (5 mg  total) by mouth daily.   [DISCONTINUED] oxyCODONE (OXY IR/ROXICODONE) 5 MG immediate release tablet Take 1 tablet (5 mg total) by mouth every 6 (six) hours as needed for moderate pain.   No facility-administered encounter medications on file as of 11/01/2022.       11/01/2022    3:00 PM 10/05/2021   11:00 AM 04/03/2021   10:00 AM  MMSE - Mini Mental State Exam  Orientation to time 0 2 1  Orientation to Place 4 3 4   Registration 3 3 3   Attention/ Calculation 4 1 4   Recall 2 0 0  Language- name 2 objects 2 2 2   Language- repeat 1 1 1   Language- follow 3 step command 3 3 3   Language- read & follow direction 1 1 1   Write a sentence 1 1 1   Copy design 1 0 1  Total score 22 17 21       03/10/2020   10:00 AM 04/16/2019   10:00 AM  Montreal Cognitive Assessment   Visuospatial/ Executive (0/5) 1 3  Naming (0/3) 3 3  Attention: Read list of digits (0/2) 2 2  Attention: Read list of letters (0/1) 1 1  Attention: Serial 7 subtraction starting at 100 (0/3) 3 3  Language: Repeat phrase (0/2) 1 1  Language : Fluency (0/1) 1 1  Abstraction (0/2) 1 2  Delayed Recall (0/5) 0 2  Orientation (0/6) 3 6  Total 16 24  Adjusted Score (based on education) 16     Objective:     PHYSICAL EXAMINATION:    VITALS:   Vitals:   11/01/22 1451  BP: 126/77  Pulse: 83  Resp: 18  SpO2: 95%  Weight: 193 lb (87.5 kg)  Height: 6\' 1"  (1.854 m)    GEN:  The patient appears stated age and is in NAD. HEENT:  Normocephalic, atraumatic.   Neurological examination:  General: NAD, well-groomed, appears stated age. Orientation: The patient is alert. Oriented to person, place and not to date (01/08/2016) Cranial nerves: There is good facial symmetry.The speech is fluent and clear. No aphasia or dysarthria. Fund of knowledge is appropriate. Recent and remote memory are impaired. Attention and concentration are reduced.  Able to name  objects and repeat phrases.  Hearing is intact to conversational tone.    Delayed recall 2/3 Sensation: Sensation is intact to light touch throughout Motor: Strength is at least antigravity x4. DTR's 2/4 in UE/LE     Movement examination: Tone: There is normal tone in the UE/LE Abnormal movements:  no tremor.  No myoclonus.  No asterixis.   Coordination:  There is no decremation with RAM's. Normal finger to nose  Gait and Station: The patient has no difficulty arising out of a deep-seated chair without the use of the hands. The patient's stride length is good.  Gait is cautious and narrow.    Thank you for allowing [place] the opportunity to participate in the care of this nice patient. Please do not hesitate to contact us for any questions or concerns.   Total time spent on today's visit was 32 minutes dedicated to this patient today, preparing to see patient, examining the patient, ordering tests and/or medications and counseling the patient, documenting clinical information in the EHR or other health record, independently interpreting results and communicating results to the patient/family, discussing treatment and goals, answering patient's questions and coordinating care.  Cc:  Plotnikov, Georgina Quint, MD  Marlowe Kays 11/02/2022 9:56 AM

## 2022-11-01 NOTE — Patient Instructions (Addendum)
It was a pleasure to see you today at our office.   Recommendations:  Meds:Continue taking  donepezil 5 mg daily            Continue Memantine  '10mg'$  tablets at night.                Follow up in 6  months            Please wear your hearing aids   It was a pleasure to see you today at our office.     Whom to call:  Memory  decline, memory medications: Call out office [phone]   For psychiatric meds, mood meds: Please have your primary care physician manage these medications.   Counseling regarding caregiver distress, including caregiver depression, anxiety and issues regarding community resources, adult day care programs, adult living facilities, or memory care questions:   Feel free to contact [place], Social Worker at [phone]   For assessment of decision of mental capacity and competency:  Call Dr. Anthoney Harada, geriatric psychiatrist at [phone]   If you have any severe symptoms of a stroke, or other severe issues such as confusion,severe chills or fever, etc call 911 or go to the ER as you may need to be evaluate further    RECOMMENDATIONS FOR ALL PATIENTS WITH MEMORY PROBLEMS: 1. Continue to exercise (Recommend 30 minutes of walking everyday, or 3 hours every week) 2. Increase social interactions - continue going to [place] and enjoy social gatherings with friends and family 3. Eat healthy, avoid fried foods and eat more fruits and vegetables 4. Maintain adequate blood pressure, blood sugar, and blood cholesterol level. Reducing the risk of stroke and cardiovascular disease also helps promoting better memory. 5. Avoid stressful situations. Live a simple life and avoid aggravations. Organize your time and prepare for the next day in anticipation. 6. Sleep well, avoid any interruptions of sleep and avoid any distractions in the bedroom that may interfere with adequate sleep quality 7. Avoid sugar, avoid sweets as there is a strong link between  excessive sugar intake, diabetes, and cognitive impairment We discussed the Mediterranean diet, which has been shown to help patients reduce the risk of progressive memory disorders and reduces cardiovascular risk. This includes eating fish, eat fruits and green leafy vegetables, nuts like almonds and hazelnuts, walnuts, and also use olive oil. Avoid fast foods and fried foods as much as possible. Avoid sweets and sugar as sugar use has been linked to worsening of memory function.  There is always a concern of gradual progression of memory problems. If this is the case, then we may need to adjust level of care according to patient needs. Support, both to the patient and caregiver, should then be put into place.    The Alzheimer's Association is here all day, every day for people facing Alzheimer's disease through our free 24/7 Helpline: [phone]. The Helpline provides reliable information and support to all those who need assistance, such as individuals living with memory loss, Alzheimer's or other dementia, caregivers, health care professionals and the public.  Our highly trained and knowledgeable staff can help you with: Understanding memory loss, dementia and Alzheimer's  Medications and other treatment options  General information about aging and brain health  Skills to provide quality care and to find the best care from professionals  Legal, financial and living-arrangement decisions Our Helpline also features: Confidential care consultation provided by master's level clinicians who can help with decision-making support, crisis assistance and  education on issues families face every day  Help in a caller's preferred language using our translation service that features more than 200 languages and dialects  Referrals to local community programs, services and ongoing support     FALL PRECAUTIONS: Be cautious when walking. Scan the area for obstacles that may increase the risk of trips and  falls. When getting up in the mornings, sit up at the edge of the bed for a few minutes before getting out of bed. Consider elevating the bed at the head end to avoid drop of blood pressure when getting up. Walk always in a well-lit room (use night lights in the walls). Avoid area rugs or power cords from appliances in the middle of the walkways. Use a walker or a cane if necessary and consider physical therapy for balance exercise. Get your eyesight checked regularly.  FINANCIAL OVERSIGHT: Supervision, especially oversight when making financial decisions or transactions is also recommended.  HOME SAFETY: Consider the safety of the kitchen when operating appliances like stoves, microwave oven, and blender. Consider having supervision and share cooking responsibilities until no longer able to participate in those. Accidents with firearms and other hazards in the house should be identified and addressed as well.   ABILITY TO BE LEFT ALONE: If patient is unable to contact 911 operator, consider using LifeLine, or when the need is there, arrange for someone to stay with patients. Smoking is a fire hazard, consider supervision or cessation. Risk of wandering should be assessed by caregiver and if detected at any point, supervision and safe proof recommendations should be instituted.  MEDICATION SUPERVISION: Inability to self-administer medication needs to be constantly addressed. Implement a mechanism to ensure safe administration of the medications.   DRIVING: Regarding driving, in patients with progressive memory problems, driving will be impaired. We advise to have someone else do the driving if trouble finding directions or if minor accidents are reported. Independent driving assessment is available to determine safety of driving.   If you are interested in the driving assessment, you can contact the following:  [employer] in [place]  [place]  [place] [phone] or [phone]      [place] refers to food and lifestyle choices that are based on the traditions of countries located on the [employer]. This way of eating has been shown to help prevent certain conditions and improve outcomes for people who have chronic diseases, like kidney disease and heart disease. What are tips for following this plan? Lifestyle  Cook and eat meals together with your family, when possible. Drink enough fluid to keep your urine clear or pale yellow. Be physically active every day. This includes: Aerobic exercise like running or swimming. Leisure activities like gardening, walking, or housework. Get 7-8 hours of sleep each night. If recommended by your health care provider, drink red wine in moderation. This means 1 glass a day for nonpregnant women and 2 glasses a day for men. A glass of wine equals 5 oz (150 mL). Reading food labels  Check the serving size of packaged foods. For foods such as rice and pasta, the serving size refers to the amount of cooked product, not dry. Check the total fat in packaged foods. Avoid foods that have saturated fat or trans fats. Check the ingredients list for added sugars, such as corn syrup. Shopping  At the grocery store, buy most of your food from the areas  near the walls of the store. This includes: Fresh fruits and vegetables (produce). Grains, beans, nuts, and seeds. Some of these may be available in unpackaged forms or large amounts (in bulk). Fresh seafood. Poultry and eggs. Low-fat dairy products. Buy whole ingredients instead of prepackaged foods. Buy fresh fruits and vegetables in-season from local farmers markets. Buy frozen fruits and vegetables in resealable bags. If you do not have access to quality fresh seafood, buy precooked frozen shrimp or canned fish, such as tuna, salmon, or sardines. Buy  small amounts of raw or cooked vegetables, salads, or olives from the deli or salad bar at your store. Stock your pantry so you always have certain foods on hand, such as olive oil, canned tuna, canned tomatoes, rice, pasta, and beans. Cooking  Cook foods with extra-virgin olive oil instead of using butter or other vegetable oils. Have meat as a side dish, and have vegetables or grains as your main dish. This means having meat in small portions or adding small amounts of meat to foods like pasta or stew. Use beans or vegetables instead of meat in common dishes like chili or lasagna. Experiment with different cooking methods. Try roasting or broiling vegetables instead of steaming or sauteing them. Add frozen vegetables to soups, stews, pasta, or rice. Add nuts or seeds for added healthy fat at each meal. You can add these to yogurt, salads, or vegetable dishes. Marinate fish or vegetables using olive oil, lemon juice, garlic, and fresh herbs. Meal planning  Plan to eat 1 vegetarian meal one day each week. Try to work up to 2 vegetarian meals, if possible. Eat seafood 2 or more times a week. Have healthy snacks readily available, such as: Vegetable sticks with hummus. Greek yogurt. Fruit and nut trail mix. Eat balanced meals throughout the week. This includes: Fruit: 2-3 servings a day Vegetables: 4-5 servings a day Low-fat dairy: 2 servings a day Fish, poultry, or lean meat: 1 serving a day Beans and legumes: 2 or more servings a week Nuts and seeds: 1-2 servings a day Whole grains: 6-8 servings a day Extra-virgin olive oil: 3-4 servings a day Limit red meat and sweets to only a few servings a month What are my food choices? Mediterranean diet Recommended Grains: Whole-grain pasta. Brown rice. Bulgar wheat. Polenta. Couscous. Whole-wheat bread. [place]. Vegetables: Artichokes. Beets. Broccoli. Cabbage. Carrots. Eggplant. Green beans. Chard. Kale. Spinach. Onions. Leeks. Peas.  Squash. Tomatoes. Peppers. Radishes. Fruits: Apples. Apricots. Avocado. Berries. Bananas. Cherries. Dates. Figs. Grapes. Lemons. Melon. Oranges. Peaches. Plums. Pomegranate. Meats and other protein foods: Beans. Almonds. Sunflower seeds. Pine nuts. Peanuts. [place]. Salmon. Scallops. Shrimp. [place]. Tilapia. Clams. Oysters. Eggs. Dairy: Low-fat milk. Cheese. Greek yogurt. Beverages: Water. Red wine. Herbal tea. Fats and oils: Extra virgin olive oil. Avocado oil. Grape seed oil. Sweets and desserts: [place] yogurt with honey. Baked apples. Poached pears. Trail mix. Seasoning and other foods: Basil. Cilantro. Coriander. Cumin. Mint. Parsley. Sage. Rosemary. Tarragon. Garlic. Oregano. Thyme. Pepper. Balsalmic vinegar. Tahini. Hummus. Tomato sauce. Olives. Mushrooms. Limit these Grains: Prepackaged pasta or rice dishes. Prepackaged cereal with added sugar. Vegetables: Deep fried potatoes (french fries). Fruits: Fruit canned in syrup. Meats and other protein foods: Beef. Pork. Lamb. Poultry with skin. Hot dogs. [place] Salines. Dairy: Ice cream. Sour cream. Whole milk. Beverages: Juice. Sugar-sweetened soft drinks. Beer. Liquor and spirits. Fats and oils: Butter. Canola oil. Vegetable oil. Beef fat (tallow). Lard. Sweets and desserts: Cookies. Cakes. Pies. Candy. Seasoning and other foods: Mayonnaise. Premade sauces and marinades.  The items listed may not be a complete list. Talk with your dietitian about what dietary choices are right for you. Summary The Mediterranean diet includes both food and lifestyle choices. Eat a variety of fresh fruits and vegetables, beans, nuts, seeds, and whole grains. Limit the amount of red meat and sweets that you eat. Talk with your health care provider about whether it is safe for you to drink red wine in moderation. This means 1 glass a day for nonpregnant women and 2 glasses a day for men. A glass of wine equals 5 oz (150 mL). This information is not intended to replace advice  given to you by your health care provider. Make sure you discuss any questions you have with your health care provider. Document Released: 06/24/2016 Document Revised: 07/27/2016 Document Reviewed: 06/24/2016 Elsevier Interactive Patient Education  2017 [employer].

## 2022-11-02 ENCOUNTER — Encounter: Payer: Self-pay | Admitting: Internal Medicine

## 2022-11-02 ENCOUNTER — Non-Acute Institutional Stay: Payer: Medicare Other | Admitting: Internal Medicine

## 2022-11-02 VITALS — BP 150/84 | HR 108 | Temp 98.1°F | Resp 17 | Ht 72.0 in | Wt 195.0 lb

## 2022-11-02 DIAGNOSIS — I10 Essential (primary) hypertension: Secondary | ICD-10-CM | POA: Diagnosis not present

## 2022-11-02 DIAGNOSIS — G308 Other Alzheimer's disease: Secondary | ICD-10-CM | POA: Diagnosis not present

## 2022-11-02 DIAGNOSIS — F02B3 Dementia in other diseases classified elsewhere, moderate, with mood disturbance: Secondary | ICD-10-CM

## 2022-11-02 DIAGNOSIS — F3341 Major depressive disorder, recurrent, in partial remission: Secondary | ICD-10-CM

## 2022-11-02 DIAGNOSIS — S065XAA Traumatic subdural hemorrhage with loss of consciousness status unknown, initial encounter: Secondary | ICD-10-CM

## 2022-11-02 DIAGNOSIS — R296 Repeated falls: Secondary | ICD-10-CM

## 2022-11-02 DIAGNOSIS — I480 Paroxysmal atrial fibrillation: Secondary | ICD-10-CM | POA: Diagnosis not present

## 2022-11-02 DIAGNOSIS — M069 Rheumatoid arthritis, unspecified: Secondary | ICD-10-CM

## 2022-11-02 DIAGNOSIS — I2510 Atherosclerotic heart disease of native coronary artery without angina pectoris: Secondary | ICD-10-CM | POA: Diagnosis not present

## 2022-11-02 DIAGNOSIS — E782 Mixed hyperlipidemia: Secondary | ICD-10-CM | POA: Diagnosis not present

## 2022-11-02 DIAGNOSIS — G4733 Obstructive sleep apnea (adult) (pediatric): Secondary | ICD-10-CM | POA: Diagnosis not present

## 2022-11-02 NOTE — Progress Notes (Unsigned)
Location:  [place] of Service:  Clinic (12)  Provider:   Code Status:  Goals of Care:     11/02/2022    9:04 AM  Advanced Directives  Does Patient Have a Medical Advance Directive? Yes  Type of Paramedic of [place];Living will  Does patient want to make changes to medical advance directive? No - Patient declined  Copy of [place] in Chart? Yes - validated most recent copy scanned in chart (See row information)     Chief Complaint  Patient presents with   Medical Management of Chronic Issues    Follow up after hospital visit   Quality Metric Gaps    Discussed the need for New Covid vaccine     HPI: Patient is a 88 y.o. male seen today for medical management of chronic diseases.    Patient was admitted in the SNF for Rehab after Admitted in the hospital from 11/18-11/21 For Subdural Hematoma after Fall Was discharged back home with his wife Came for follow up   Patient has h/o Dementia with behavior issues, Rheumatoid Arthritis, A Fib, HTN, CAD s/p PCI, GERD and Sleep apnea And Depression    Patient came with his wife.  He thinks he is doing good But per his wife Urinary incontinence Refuses to wear pull-ups.  Has a lot of accidents S/p fall Again  Patient fell 1 night when he hit his head on the room door when he was trying to go to the bathroom.  The wife said that she was able to help him get up. Did not sustain any injuries Worsening memory with agitation Gets very upset easily and irritated with his wife And wife is not thinking if he needs to move to [place] They do have caregivers couple of hours 3 times a week .  Patient is also drinking alcohol.  Not sure how much.  Wife is not able to monitor it.   Past Medical History:  Diagnosis Date   CAD (coronary artery disease)    a. s/p inferior STEMI 01/08/2014; LHC 01/08/14: total RCA occlusion s/p 3.5x42m Xience DES distal RCA and 3.5x28  mm DES mid RCA, 60-70% mid LAD stenosis, EF 55%.   Complication of anesthesia    'long to wake up after back surgery " 09/2003   Diverticulosis of colon    GERD (gastroesophageal reflux disease)    History of cellulitis    05-26-2015  LLE   History of colon polyps    1998- benign/  2008 adenomatous    History of kidney stones    2013   History of squamous cell carcinoma excision    2013;  2015;  06-12-2015 right leg/  02/ and 05/ 2017  left ear and left leg   Hydronephrosis, left    Hyperlipidemia    Hypertension    Migraine with aura    OSA on CPAP 06/19/2015   Moderate OSA with AHI 18/hr  per study 05-20-2015   Osteoarthritis    Paroxysmal atrial fibrillation ([place] 4/16   chads2vasc score is at least 4   Premature atrial contractions    RA (rheumatoid arthritis) ([facility]    rheumatologist-  dr jLeigh [place]  Sinusitis, chronic 10/17/2015   Wears glasses    Wears hearing aid    bilateral    Past Surgical History:  Procedure Laterality Date   BACK SURGERY     CARDIOVASCULAR STRESS TEST  11/21/2015   Low  risk nuclear study w/ a small diaphragmatic attenuation artifact, no ischemia/  normal LV function and wall motion , ef 63%   CATARACT EXTRACTION W/ INTRAOCULAR LENS  IMPLANT, BILATERAL  2015   COLONOSCOPY  last one 06-09-2011   CORONARY ANGIOPLASTY     CYSTOSCOPY W/ RETROGRADES Left 07/12/2016   Procedure: CYSTOSCOPY WITH RETROGRADE PYELOGRAM LEFT URETERAL STENT;  Surgeon: Irine Seal, MD;  Location: [facility];  Service: Urology;  Laterality: Left;   CYSTOSCOPY WITH RETROGRADE PYELOGRAM, URETEROSCOPY AND STENT PLACEMENT Left 08/11/2016   Procedure: CYSTOSCOPY WITH RETROGRADE PYELOGRAM,  DIAGNOSTIC URETEROSCOPY , STENT EXCHANGE;  Surgeon: Alexis Frock, MD;  Location: [facility];  Service: Urology;  Laterality: Left;   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   severe fibromatosis palm and fingers   LEFT HEART CATHETERIZATION WITH CORONARY ANGIOGRAM N/A  01/08/2014   Procedure: LEFT HEART CATHETERIZATION WITH CORONARY ANGIOGRAM;  Surgeon: Troy Sine, MD;  Location: [facility] CATH LAB;  Service: Cardiovascular;  Laterality:N/A;  total/ subtotal RCA/  mLAD 82-99% w/ mid systolic bridging/  preserved global LVF, ef 55%   MOHS SURGERY  x2  feb and may 2017   left ear /  left leg  ([facility])   PERCUTANEOUS CORONARY STENT INTERVENTION (PCI-S)  01/08/2014   Procedure: PERCUTANEOUS CORONARY STENT INTERVENTION (PCI-S);  Surgeon: Troy Sine, MD;  Location: [facility] CATH LAB;  Service: Cardiovascular;;  DES to mid and distal RCA   POSTERIOR LUMBAR FUSION  10/01/2003   and Laminectomy/ diskectomy  L4 -- S1   ROBOT ASSISTED PYELOPLASTY Left 09/15/2016   Procedure: XI ROBOTIC ASSISTED PYELOPLASTY;  Surgeon: Alexis Frock, MD;  Location: [facility];  Service: Urology;  Laterality: Left;   TONSILLECTOMY     TRANSTHORACIC ECHOCARDIOGRAM  02/23/2016   mild LVH,  ef 50-55%,  grade 1 diastolic dysfunction/  mild to moderate AV calcification w/ no stenosis or regurg./  trivial MR and TR/  mild PR    Allergies  Allergen Reactions   Methotrexate Other (See Comments)    REACTION: tachycardia   Aricept [Donepezil Hcl]     nightmares    Crestor [Rosuvastatin Calcium]     Myalgias with '20mg'$  dose   Lisinopril Other (See Comments)    cough   Namenda [Memantine]     n/d   Atorvastatin Other (See Comments)    Muscle pain, leg cramps   Pravastatin Sodium Other (See Comments)    REACTION: cramps, fatigue    Outpatient Encounter Medications as of 11/02/2022  Medication Sig   abatacept (ORENCIA) 250 MG injection every 30 (thirty) days.   acetaminophen (TYLENOL) 500 MG tablet Take 1,000 mg by mouth 2 (two) times daily. Scheduled and 2 by mouth as needed   apixaban (ELIQUIS) 2.5 MG TABS tablet Take 1 tablet (2.5 mg total) by mouth 2 (two) times daily.   aspirin EC 81 MG tablet Take 81 mg by mouth daily. Swallow whole.   docusate sodium (COLACE) 100 MG capsule Take 1 capsule (100  mg total) by mouth 2 (two) times daily.   donepezil (ARICEPT) 5 MG tablet Take 5 mg by mouth at bedtime.   escitalopram (LEXAPRO) 20 MG tablet Take 20 mg by mouth daily.   Evolocumab (REPATHA SURECLICK) 371 MG/ML SOAJ INJECT 1 PEN INTO SKIN EVERY 14 DAYS   ezetimibe (ZETIA) 10 MG tablet Take 5 mg by mouth daily.   gabapentin (NEURONTIN) 300 MG capsule Take 1 capsule (300 mg total) by mouth 3 (three) times daily.   lidocaine  4 % Place 1 patch onto the skin 2 (two) times daily. Left side x 30 days   LORazepam (ATIVAN) 0.5 MG tablet Take 0.5 mg by mouth 2 (two) times daily as needed for anxiety.   magnesium hydroxide (MILK OF MAGNESIA) 400 MG/5ML suspension Take 45 mLs by mouth as needed for mild constipation.   memantine (NAMENDA) 10 MG tablet Take 10 mg by mouth at bedtime.   metoprolol tartrate (LOPRESSOR) 25 MG tablet Take 25 mg by mouth at bedtime.   nitroGLYCERIN (NITROSTAT) 0.4 MG SL tablet Place 0.4 mg under the tongue every 5 (five) minutes as needed for chest pain.   OLANZapine (ZYPREXA) 2.5 MG tablet Take 2.5 mg by mouth at bedtime.   omeprazole (PRILOSEC) 40 MG capsule Take 40 mg by mouth daily. Take on empty stomach 30 minutes before breakfast.   polyethylene glycol (MIRALAX / GLYCOLAX) 17 g packet Take 17 g by mouth as needed for mild constipation.   rosuvastatin (CRESTOR) 5 MG tablet Take 1 tablet (5 mg total) by mouth daily.   [DISCONTINUED] oxyCODONE (OXY IR/ROXICODONE) 5 MG immediate release tablet Take 1 tablet (5 mg total) by mouth every 6 (six) hours as needed for moderate pain.   No facility-administered encounter medications on file as of 11/02/2022.    Review of Systems:  Review of Systems  Constitutional:  Negative for activity change, appetite change and unexpected weight change.  HENT: Negative.    Respiratory:  Negative for cough and shortness of breath.   Cardiovascular:  Negative for leg swelling.  Gastrointestinal:  Negative for constipation.  Genitourinary:   Positive for frequency.  Musculoskeletal:  Positive for gait problem. Negative for arthralgias and myalgias.  Skin: Negative.  Negative for rash.  Neurological:  Negative for dizziness and weakness.  Psychiatric/Behavioral:  Positive for agitation, behavioral problems and confusion. Negative for sleep disturbance.   All other systems reviewed and are negative.   Health Maintenance  Topic Date Due   COVID-19 Vaccine (4 - 2023-24 season) 07/16/2022   Medicare Annual Wellness (AWV)  06/26/2023   DTaP/Tdap/Td (3 - Td or Tdap) 09/22/2032   Pneumonia Vaccine 55+ Years old  Completed   INFLUENZA VACCINE  Completed   Zoster Vaccines- Shingrix  Completed   HPV VACCINES  Aged Out    Physical Exam: Vitals:   11/02/22 0905  BP: (!) 150/84  Pulse: (!) 108  Resp: 17  Temp: 98.1 F (36.7 C)  TempSrc: Temporal  SpO2: 95%  Weight: 195 lb (88.5 kg)  Height: 6' (1.829 m)   Body mass index is 26.45 kg/m. Physical Exam Vitals reviewed.  Constitutional:      Appearance: Normal appearance.  HENT:     Head: Normocephalic.     Nose: Nose normal.     Mouth/Throat:     Mouth: Mucous membranes are moist.     Pharynx: Oropharynx is clear.  Eyes:     Pupils: Pupils are equal, round, and reactive to light.  Cardiovascular:     Rate and Rhythm: Normal rate and regular rhythm.     Pulses: Normal pulses.     Heart sounds: No murmur heard. Pulmonary:     Effort: Pulmonary effort is normal. No respiratory distress.     Breath sounds: Normal breath sounds. No rales.  Abdominal:     General: Abdomen is flat. Bowel sounds are normal.     Palpations: Abdomen is soft.  Musculoskeletal:        General: No swelling.  Cervical back: Neck supple.  Skin:    General: Skin is warm.  Neurological:     General: No focal deficit present.     Mental Status: He is alert.     Comments: Walk slightly unsteady   Psychiatric:        Mood and Affect: Mood normal.        Thought Content: Thought content  normal.     Labs reviewed: Basic Metabolic Panel: Recent Labs    10/03/22 0310 10/04/22 0552 10/05/22 0243 10/11/22 0600  NA 138 142 139 143  K 4.0 3.8 3.8 4.1  CL 104 106 107 107  CO2 '23 26 23 '$ 25*  GLUCOSE 96 97 109*  --   BUN '12 12 14 16  '$ CREATININE 0.91 0.95 0.93 0.9  CALCIUM 8.9 8.8* 8.7* 8.8   Liver Function Tests: Recent Labs    07/29/22 1345 10/02/22 0350 10/11/22 0600  AST '17 24 21  '$ ALT '13 16 13  '$ ALKPHOS 58 61 89  BILITOT 0.5 0.6  --   PROT 7.7 7.0  --   ALBUMIN 4.1 4.0 3.7   No results for input(s): "LIPASE", "AMYLASE" in the last 8760 hours. No results for input(s): "AMMONIA" in the last 8760 hours. CBC: Recent Labs    07/29/22 1345 09/24/22 0000 10/03/22 0310 10/04/22 0552 10/05/22 0243 10/11/22 0600  WBC 5.3   < > 7.2 6.1 6.4 5.4  NEUTROABS 2.7  --   --   --   --   --   HGB 12.8*   < > 12.4* 11.3* 10.8* 10.8*  HCT 38.2*   < > 36.6* 33.7* 32.2* 32*  MCV 99.3   < > 100.3* 98.5 98.8  --   PLT 274.0   < > 289 296 277  --    < > = values in this interval not displayed.   Lipid Panel: No results for input(s): "CHOL", "HDL", "LDLCALC", "TRIG", "CHOLHDL", "LDLDIRECT" in the last 8760 hours. Lab Results  Component Value Date   HGBA1C 6.4 08/30/2017    Procedures since last visit: No results found.  Assessment/Plan 1. Subdural hematoma ([place]) Patient does now have any focal Symptoms  2. Falls frequently Working with therapy  Refusing to use cane or walker  3. Paroxysmal atrial fibrillation (HCC) Continue Eliquis at lower dose due to his fall and SDH Review next visit and Probably increase the dose  4. Rib Pain s/p Rib fracture Tylenol PRn  5. OSA (obstructive sleep apnea) Does not use CPAP  6. Moderate Alzheimer's dementia of other onset with mood disturbance ([place]) Sees Neurology On Aricept and Namenda Also on Zyprexa for his behaviors  7. Rheumatoid arthritis involving elbow, unspecified laterality, unspecified whether rheumatoid  factor present ([place]) On Orencia per Dr Amil Amen  8. Mixed hyperlipidemia Repatha and Zetia and Crestor Will need Lipid panel  9. Primary hypertension Metoprolol  10. Recurrent major depressive disorder, in partial remission (HCC) On Lexapro and Ativan PRn  11 urinary incontinence Refuses to wear pull-ups  lot of accidents  Also falls because he tries to rush  to the bathroom She does not want any medication because she states she is already on statin medication 12 level of care Discussed with the son and the facility nurse Patient needs higher level of care.  Wife is struggling keeping up with his falls medications and his behavior.  I discussed all that with his son  Labs/tests ordered:   Next appt:  11/30/2022

## 2022-11-07 ENCOUNTER — Other Ambulatory Visit: Payer: Self-pay | Admitting: Internal Medicine

## 2022-11-07 ENCOUNTER — Other Ambulatory Visit: Payer: Self-pay | Admitting: Cardiology

## 2022-11-07 DIAGNOSIS — I480 Paroxysmal atrial fibrillation: Secondary | ICD-10-CM

## 2022-11-12 ENCOUNTER — Telehealth: Payer: Self-pay | Admitting: Cardiology

## 2022-11-12 NOTE — Telephone Encounter (Signed)
Left vm to advise [facility] that pt has not been seen in over 1 year.

## 2022-11-12 NOTE — Telephone Encounter (Signed)
*  STAT* If patient is at the pharmacy, call can be transferred to refill team.   1. Which medications need to be refilled? (please list name of each medication and dose if known)   rosuvastatin (CRESTOR) 5 MG tablet    2. Which pharmacy/location (including street and city if local pharmacy) is medication to be sent to?  [facility] - [place], [place] - [place] [address]    3. Do they need a 30 day or 90 day supply? [place]

## 2022-11-16 ENCOUNTER — Other Ambulatory Visit: Payer: Self-pay | Admitting: Cardiology

## 2022-11-16 DIAGNOSIS — I480 Paroxysmal atrial fibrillation: Secondary | ICD-10-CM

## 2022-11-30 ENCOUNTER — Non-Acute Institutional Stay: Payer: Medicare Other | Admitting: Internal Medicine

## 2022-11-30 ENCOUNTER — Encounter: Payer: Self-pay | Admitting: Internal Medicine

## 2022-11-30 VITALS — BP 148/88 | HR 96 | Temp 98.0°F | Resp 18 | Ht 72.0 in | Wt 197.0 lb

## 2022-11-30 DIAGNOSIS — I480 Paroxysmal atrial fibrillation: Secondary | ICD-10-CM

## 2022-11-30 DIAGNOSIS — G308 Other Alzheimer's disease: Secondary | ICD-10-CM | POA: Diagnosis not present

## 2022-11-30 DIAGNOSIS — I10 Essential (primary) hypertension: Secondary | ICD-10-CM | POA: Diagnosis not present

## 2022-11-30 DIAGNOSIS — M069 Rheumatoid arthritis, unspecified: Secondary | ICD-10-CM | POA: Diagnosis not present

## 2022-11-30 DIAGNOSIS — F02B3 Dementia in other diseases classified elsewhere, moderate, with mood disturbance: Secondary | ICD-10-CM

## 2022-11-30 DIAGNOSIS — G4733 Obstructive sleep apnea (adult) (pediatric): Secondary | ICD-10-CM | POA: Diagnosis not present

## 2022-11-30 DIAGNOSIS — S065XAA Traumatic subdural hemorrhage with loss of consciousness status unknown, initial encounter: Secondary | ICD-10-CM

## 2022-11-30 MED ORDER — APIXABAN 5 MG PO TABS: 5.0000 mg | ORAL_TABLET | Freq: Two times a day (BID) | ORAL | 3 refills | Status: DC

## 2022-11-30 NOTE — Progress Notes (Signed)
Location: [place] of Service:  Clinic (12)  Provider:   Code Status:  Goals of Care:     11/30/2022    9:57 AM  Advanced Directives  Does Patient Have a Medical Advance Directive? Yes  Type of Paramedic of [place];Living will  Does patient want to make changes to medical advance directive? No - Patient declined  Copy of [place] in Chart? Yes - validated most recent copy scanned in chart (See row information)     Chief Complaint  Patient presents with   Medical Management of Chronic Issues    Follow up. Patient wife is concerned because patient left out and didn't know where he was    HPI: Patient is a 89 y.o. male seen today for an acute visit for Behaviors and Dementia  Lives in [place] with his wife  Patient has h/o Dementia with behavior issues, Rheumatoid Arthritis, A Fib, HTN, CAD s/p PCI, GERD and Sleep apnea And Depression   Admitted in the Rehab after Admitted in the hospital from 11/18-11/21 For Subdural Hematoma after Fall   Per [facility] staff  and Wife Patient left his apartment at 3 in the morning and started knocking on other residents Door Eventually he found his way back to his room He is also drinking and start at 3 pm some days Refuses to wear Pull ups and Pees everywhere Gait is not very stable but has not had any falls Also Missing his meds per Nurses in [place] who check on his Med Box as wife not able to keep up with the reminders     Past Medical History:  Diagnosis Date   CAD (coronary artery disease)    a. s/p inferior STEMI 01/08/2014; LHC 01/08/14: total RCA occlusion s/p 3.5x5m Xience DES distal RCA and 3.5x28 mm DES mid RCA, 60-70% mid LAD stenosis, EF 55%.   Complication of anesthesia    'long to wake up after back surgery " 09/2003   Diverticulosis of colon    GERD (gastroesophageal reflux disease)    History of cellulitis    05-26-2015  LLE   History of colon polyps     1998- benign/  2008 adenomatous    History of kidney stones    2013   History of squamous cell carcinoma excision    2013;  2015;  06-12-2015 right leg/  02/ and 05/ 2017  left ear and left leg   Hydronephrosis, left    Hyperlipidemia    Hypertension    Migraine with aura    OSA on CPAP 06/19/2015   Moderate OSA with AHI 18/hr  per study 05-20-2015   Osteoarthritis    [place]   chads2vasc score is at least 4   Premature atrial contractions    RA (rheumatoid arthritis) ([facility]    rheumatologist-  dr jLeigh Aurora  Sinusitis, chronic 10/17/2015   Wears glasses    Wears hearing aid    bilateral    Past Surgical History:  Procedure Laterality Date   BACK SURGERY     CARDIOVASCULAR STRESS TEST  11/21/2015   Low risk nuclear study w/ a small diaphragmatic attenuation artifact, no ischemia/  normal LV function and wall motion , ef 63%   CATARACT EXTRACTION W/ INTRAOCULAR LENS  IMPLANT, BILATERAL  2015   COLONOSCOPY  last one 06-09-2011   CORONARY ANGIOPLASTY     CYSTOSCOPY W/ RETROGRADES Left 07/12/2016   Procedure:  CYSTOSCOPY WITH RETROGRADE PYELOGRAM LEFT URETERAL STENT;  Surgeon: Irine Seal, MD;  Location: [facility];  Service: Urology;  Laterality: Left;   CYSTOSCOPY WITH RETROGRADE PYELOGRAM, URETEROSCOPY AND STENT PLACEMENT Left 08/11/2016   Procedure: CYSTOSCOPY WITH RETROGRADE PYELOGRAM,  DIAGNOSTIC URETEROSCOPY , STENT EXCHANGE;  Surgeon: Alexis Frock, MD;  Location: [facility];  Service: Urology;  Laterality: Left;   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   severe fibromatosis palm and fingers   LEFT HEART CATHETERIZATION WITH CORONARY ANGIOGRAM N/A 01/08/2014   Procedure: LEFT HEART CATHETERIZATION WITH CORONARY ANGIOGRAM;  Surgeon: Troy Sine, MD;  Location: [facility] CATH LAB;  Service: Cardiovascular;  Laterality:N/A;  total/ subtotal RCA/  mLAD 93-26% w/ mid systolic bridging/  preserved global LVF, ef 55%   MOHS SURGERY   x2  feb and may 2017   left ear /  left leg  ([facility])   PERCUTANEOUS CORONARY STENT INTERVENTION (PCI-S)  01/08/2014   Procedure: PERCUTANEOUS CORONARY STENT INTERVENTION (PCI-S);  Surgeon: Troy Sine, MD;  Location: [facility] C[facility] LAB;  Service: Cardiovascular;;  DES to mid and distal RCA   POSTERIOR LUMBAR FUSION  10/01/2003   and Laminectomy/ diskectomy  L4 -- S1   ROBOT ASSISTED PYELOPLASTY Left 09/15/2016   Procedure: XI ROBOTIC ASSISTED PYELOPLASTY;  Surgeon: Alexis Frock, MD;  Location: [facility];  Service: Urology;  Laterality: Left;   TONSILLECTOMY     TRANSTHORACIC ECHOCARDIOGRAM  02/23/2016   mild LVH,  ef 50-55%,  grade 1 diastolic dysfunction/  mild to moderate AV calcification w/ no stenosis or regurg./  trivial MR and TR/  mild PR    Allergies  Allergen Reactions   Methotrexate Other (See Comments)    REACTION: tachycardia   Aricept [Donepezil Hcl]     nightmares    Crestor [Rosuvastatin Calcium]     Myalgias with '20mg'$  dose   Lisinopril Other (See Comments)    cough   Namenda [Memantine]     n/d   Atorvastatin Other (See Comments)    Muscle pain, leg cramps   Pravastatin Sodium Other (See Comments)    REACTION: cramps, fatigue    Outpatient Encounter Medications as of 11/30/2022  Medication Sig   abatacept (ORENCIA) 250 MG injection every 30 (thirty) days.   acetaminophen (TYLENOL) 500 MG tablet Take 1,000 mg by mouth 2 (two) times daily. Scheduled and 2 by mouth as needed   apixaban (ELIQUIS) 2.5 MG TABS tablet Take 1 tablet (2.5 mg total) by mouth 2 (two) times daily.   aspirin EC 81 MG tablet Take 81 mg by mouth daily. Swallow whole.   docusate sodium (COLACE) 100 MG capsule Take 1 capsule (100 mg total) by mouth 2 (two) times daily.   donepezil (ARICEPT) 5 MG tablet TAKE ONE TABLET BY MOUTH EVERY MORNING   escitalopram (LEXAPRO) 20 MG tablet Take 20 mg by mouth daily.   Evolocumab (REPATHA SURECLICK) 712 MG/ML SOAJ INJECT 1 PEN INTO SKIN EVERY 14 DAYS   ezetimibe  (ZETIA) 10 MG tablet Take 5 mg by mouth daily.   gabapentin (NEURONTIN) 300 MG capsule Take 1 capsule (300 mg total) by mouth 3 (three) times daily.   lidocaine 4 % Place 1 patch onto the skin 2 (two) times daily. Left side x 30 days   LORazepam (ATIVAN) 0.5 MG tablet Take 0.5 mg by mouth 2 (two) times daily as needed for anxiety.   magnesium hydroxide (MILK OF MAGNESIA) 400 MG/5ML suspension Take 45 mLs by mouth as needed for mild  constipation.   memantine (NAMENDA) 10 MG tablet Take 10 mg by mouth at bedtime.   metoprolol tartrate (LOPRESSOR) 25 MG tablet Take 1 tablet (25 mg total) by mouth 2 (two) times daily.   nitroGLYCERIN (NITROSTAT) 0.4 MG SL tablet Place 0.4 mg under the tongue every 5 (five) minutes as needed for chest pain.   OLANZapine (ZYPREXA) 2.5 MG tablet Take 2.5 mg by mouth at bedtime.   omeprazole (PRILOSEC) 40 MG capsule Take 40 mg by mouth daily. Take on empty stomach 30 minutes before breakfast.   polyethylene glycol (MIRALAX / GLYCOLAX) 17 g packet Take 17 g by mouth as needed for mild constipation.   rosuvastatin (CRESTOR) 5 MG tablet Take 1 tablet (5 mg total) by mouth daily.   No facility-administered encounter medications on file as of 11/30/2022.    Review of Systems:  Review of Systems  Constitutional:  Negative for activity change, appetite change and unexpected weight change.  HENT: Negative.    Respiratory:  Negative for cough and shortness of breath.   Cardiovascular:  Negative for leg swelling.  Gastrointestinal:  Negative for constipation.  Genitourinary:  Positive for frequency and urgency.  Musculoskeletal:  Positive for gait problem. Negative for arthralgias and myalgias.  Skin: Negative.  Negative for rash.  Neurological:  Negative for dizziness and weakness.  Psychiatric/Behavioral:  Positive for agitation, behavioral problems, confusion and sleep disturbance.   All other systems reviewed and are negative.   Health Maintenance  Topic Date Due    COVID-19 Vaccine (4 - 2023-24 season) 07/16/2022   Medicare Annual Wellness (AWV)  06/26/2023   DTaP/Tdap/Td (3 - Td or Tdap) 09/22/2032   Pneumonia Vaccine 53+ Years old  Completed   INFLUENZA VACCINE  Completed   Zoster Vaccines- Shingrix  Completed   HPV VACCINES  Aged Out    Physical Exam: Vitals:   11/30/22 0955  BP: (!) 148/88  Pulse: 96  Resp: 18  Temp: 98 F (36.7 C)  TempSrc: Temporal  SpO2: 96%  Weight: 197 lb (89.4 kg)  Height: 6' (1.829 m)   Body mass index is 26.72 kg/m. Physical Exam Vitals reviewed.  Constitutional:      Appearance: Normal appearance.  HENT:     Head: Normocephalic.     Nose: Nose normal.     Mouth/Throat:     Mouth: Mucous membranes are moist.     Pharynx: Oropharynx is clear.  Eyes:     Pupils: Pupils are equal, round, and reactive to light.  Cardiovascular:     Rate and Rhythm: Normal rate. Rhythm irregular.     Pulses: Normal pulses.     Heart sounds: No murmur heard. Pulmonary:     Effort: Pulmonary effort is normal. No respiratory distress.     Breath sounds: Normal breath sounds. No rales.  Abdominal:     General: Abdomen is flat. Bowel sounds are normal.     Palpations: Abdomen is soft.  Musculoskeletal:        General: No swelling.     Cervical back: Neck supple.  Skin:    General: Skin is warm.  Neurological:     General: No focal deficit present.     Mental Status: He is alert.  Psychiatric:        Mood and Affect: Mood normal.        Thought Content: Thought content normal.     Labs reviewed: Basic Metabolic Panel: Recent Labs    10/03/22 0310 10/04/22 0552 10/05/22 0243 10/11/22  0600  NA 138 142 139 143  K 4.0 3.8 3.8 4.1  CL 104 106 107 107  CO2 '23 26 23 '$ 25*  GLUCOSE 96 97 109*  --   BUN '12 12 14 16  '$ CREATININE 0.91 0.95 0.93 0.9  CALCIUM 8.9 8.8* 8.7* 8.8   Liver Function Tests: Recent Labs    07/29/22 1345 10/02/22 0350 10/11/22 0600  AST '17 24 21  '$ ALT '13 16 13  '$ ALKPHOS 58 61 89   BILITOT 0.5 0.6  --   PROT 7.7 7.0  --   ALBUMIN 4.1 4.0 3.7   No results for input(s): "LIPASE", "AMYLASE" in the last 8760 hours. No results for input(s): "AMMONIA" in the last 8760 hours. CBC: Recent Labs    07/29/22 1345 09/24/22 0000 10/03/22 0310 10/04/22 0552 10/05/22 0243 10/11/22 0600  WBC 5.3   < > 7.2 6.1 6.4 5.4  NEUTROABS 2.7  --   --   --   --   --   HGB 12.8*   < > 12.4* 11.3* 10.8* 10.8*  HCT 38.2*   < > 36.6* 33.7* 32.2* 32*  MCV 99.3   < > 100.3* 98.5 98.8  --   PLT 274.0   < > 289 296 277  --    < > = values in this interval not displayed.   Lipid Panel: No results for input(s): "CHOL", "HDL", "LDLCALC", "TRIG", "CHOLHDL", "LDLDIRECT" in the last 8760 hours. Lab Results  Component Value Date   HGBA1C 6.4 08/30/2017    Procedures since last visit: No results found.  Assessment/Plan 1. Paroxysmal atrial fibrillation (HCC) Change Eliquis to 5 mg BID his Baseline dose He was on Reduced dose due to SDH  2. 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 They will talk to his Children  3. Subdural hematoma (HCC)10/02/22 Doing well   4. Rheumatoid arthritis involving elbow, unspecified laterality, unspecified whether rheumatoid factor present ([place]) On Orencia per Dr Amil Amen  5. OSA (obstructive sleep apnea) Refuses to wear CPAP  6. Primary hypertension Metoprolol 7 Again D/W nurses that he needs to Move to AL/SNF level of care 8 Rib Pain s/p Rib fracture Tylenol PRn 9 Recurrent major depressive disorder, in partial remission (HCC) On Lexapro and Ativan PRn   10 urinary incontinence Refuses to wear pull-ups  lot of accidents  Also falls because he tries to rush  to the bathroom  Labs/tests ordered:  * No order type specified * Next appt:  Visit date not found

## 2022-12-16 ENCOUNTER — Other Ambulatory Visit: Payer: Self-pay | Admitting: Cardiology

## 2022-12-16 DIAGNOSIS — I480 Paroxysmal atrial fibrillation: Secondary | ICD-10-CM

## 2022-12-16 NOTE — Telephone Encounter (Signed)
Prescription refill request for Eliquis received. Indication: Afib  Last office visit: 10/07/21 Chalmers Cater)  Scr: 0.9 (10/11/22)  Age: 89 Weight: 89.4kg  Overdue for office visit. Refill want sent on 11/30/22 by Dr Lyndel Safe. Pt will need appt for future refills. Called pt to make him aware. No answer, left message.

## 2022-12-26 ENCOUNTER — Emergency Department (HOSPITAL_COMMUNITY): Payer: Medicare Other

## 2022-12-26 ENCOUNTER — Emergency Department (HOSPITAL_COMMUNITY)
Admission: EM | Admit: 2022-12-26 | Discharge: 2022-12-26 | Disposition: A | Discharge status: Other Acute Inpatient Hospital | Payer: Medicare Other | Attending: Emergency Medicine | Admitting: Emergency Medicine

## 2022-12-26 ENCOUNTER — Encounter (HOSPITAL_COMMUNITY): Payer: Self-pay | Admitting: Internal Medicine

## 2022-12-26 DIAGNOSIS — Z743 Need for continuous supervision: Secondary | ICD-10-CM | POA: Diagnosis not present

## 2022-12-26 DIAGNOSIS — Z79899 Other long term (current) drug therapy: Secondary | ICD-10-CM | POA: Diagnosis not present

## 2022-12-26 DIAGNOSIS — I10 Essential (primary) hypertension: Secondary | ICD-10-CM | POA: Diagnosis not present

## 2022-12-26 DIAGNOSIS — R410 Disorientation, unspecified: Secondary | ICD-10-CM | POA: Diagnosis not present

## 2022-12-26 DIAGNOSIS — A419 Sepsis, unspecified organism: Secondary | ICD-10-CM | POA: Diagnosis not present

## 2022-12-26 DIAGNOSIS — F0390 Unspecified dementia without behavioral disturbance: Secondary | ICD-10-CM | POA: Insufficient documentation

## 2022-12-26 DIAGNOSIS — I4891 Unspecified atrial fibrillation: Secondary | ICD-10-CM | POA: Diagnosis not present

## 2022-12-26 DIAGNOSIS — R4182 Altered mental status, unspecified: Secondary | ICD-10-CM

## 2022-12-26 DIAGNOSIS — Z7901 Long term (current) use of anticoagulants: Secondary | ICD-10-CM | POA: Diagnosis not present

## 2022-12-26 DIAGNOSIS — I493 Ventricular premature depolarization: Secondary | ICD-10-CM | POA: Diagnosis not present

## 2022-12-26 DIAGNOSIS — U071 COVID-19: Secondary | ICD-10-CM | POA: Insufficient documentation

## 2022-12-26 DIAGNOSIS — R531 Weakness: Secondary | ICD-10-CM | POA: Diagnosis not present

## 2022-12-26 LAB — CBC WITH DIFFERENTIAL/PLATELET
Abs Immature Granulocytes: 0.03 10*3/uL (ref 0.00–0.07)
Basophils Absolute: 0.1 10*3/uL (ref 0.0–0.1)
Basophils Relative: 1 %
Eosinophils Absolute: 0.1 10*3/uL (ref 0.0–0.5)
Eosinophils Relative: 1 %
HCT: 38.3 % — ABNORMAL LOW (ref 39.0–52.0)
Hemoglobin: 12.7 g/dL — ABNORMAL LOW (ref 13.0–17.0)
Immature Granulocytes: 0 %
Lymphocytes Relative: 13 %
Lymphs Abs: 1.1 10*3/uL (ref 0.7–4.0)
MCH: 32.2 pg (ref 26.0–34.0)
MCHC: 33.2 g/dL (ref 30.0–36.0)
MCV: 97 fL (ref 80.0–100.0)
Monocytes Absolute: 0.7 10*3/uL (ref 0.1–1.0)
Monocytes Relative: 8 %
Neutro Abs: 6.2 10*3/uL (ref 1.7–7.7)
Neutrophils Relative %: 77 %
Platelets: 272 10*3/uL (ref 150–400)
RBC: 3.95 MIL/uL — ABNORMAL LOW (ref 4.22–5.81)
RDW: 14 % (ref 11.5–15.5)
WBC: 8.1 10*3/uL (ref 4.0–10.5)
nRBC: 0 % (ref 0.0–0.2)

## 2022-12-26 LAB — COMPREHENSIVE METABOLIC PANEL
ALT: 12 U/L (ref 0–44)
AST: 30 U/L (ref 15–41)
Albumin: 3.8 g/dL (ref 3.5–5.0)
Alkaline Phosphatase: 85 U/L (ref 38–126)
Anion gap: 12 (ref 5–15)
BUN: 11 mg/dL (ref 8–23)
CO2: 22 mmol/L (ref 22–32)
Calcium: 9 mg/dL (ref 8.9–10.3)
Chloride: 102 mmol/L (ref 98–111)
Creatinine, Ser: 0.9 mg/dL (ref 0.61–1.24)
GFR, Estimated: >60 mL/min (ref >60)
Glucose, Bld: 105 mg/dL — ABNORMAL HIGH (ref 70–99)
Potassium: 3.6 mmol/L (ref 3.5–5.1)
Sodium: 136 mmol/L (ref 135–145)
Total Bilirubin: 1 mg/dL (ref 0.3–1.2)
Total Protein: 7.8 g/dL (ref 6.5–8.1)

## 2022-12-26 LAB — RESP PANEL BY RT-PCR (RSV, FLU A&B, COVID)  RVPGX2
Influenza A by PCR: NEGATIVE
Influenza B by PCR: NEGATIVE
Resp Syncytial Virus by PCR: NEGATIVE
SARS Coronavirus 2 by RT PCR: POSITIVE — AB

## 2022-12-26 LAB — PROTIME-INR
INR: 1 (ref 0.8–1.2)
Prothrombin Time: 13.3 seconds (ref 11.4–15.2)

## 2022-12-26 LAB — URINALYSIS, ROUTINE W REFLEX MICROSCOPIC
Bacteria, UA: NONE SEEN
Bilirubin Urine: NEGATIVE
Glucose, UA: NEGATIVE mg/dL
Hgb urine dipstick: NEGATIVE
Ketones, ur: 5 mg/dL — AB
Leukocytes,Ua: NEGATIVE
Nitrite: NEGATIVE
Protein, ur: 30 mg/dL — AB
Specific Gravity, Urine: 1.024 (ref 1.005–1.030)
pH: 5 (ref 5.0–8.0)

## 2022-12-26 LAB — LACTIC ACID, PLASMA
Lactic Acid, Venous: 2.6 mmol/L (ref 0.5–1.9)
Lactic Acid, Venous: 0.9 mmol/L (ref 0.5–1.9)

## 2022-12-26 LAB — APTT: aPTT: 27 seconds (ref 24–36)

## 2022-12-26 MED ORDER — SODIUM CHLORIDE 0.9 % IV BOLUS (SEPSIS)
1000.0000 mL | Freq: Once | INTRAVENOUS | Status: AC
  Administered 2022-12-26: 1000 mL via INTRAVENOUS

## 2022-12-26 MED ORDER — SODIUM CHLORIDE 0.9 % IV SOLN
500.0000 mg | Freq: Once | INTRAVENOUS | Status: AC
  Administered 2022-12-26: 500 mg via INTRAVENOUS
  Filled 2022-12-26: qty 5

## 2022-12-26 MED ORDER — SODIUM CHLORIDE 0.9 % IV SOLN
2.0000 g | INTRAVENOUS | Status: DC
  Administered 2022-12-26: 2 g via INTRAVENOUS
  Filled 2022-12-26: qty 20

## 2022-12-26 MED ORDER — NIRMATRELVIR/RITONAVIR (PAXLOVID)TABLET: 3.0000 | ORAL_TABLET | Freq: Two times a day (BID) | ORAL | Status: DC

## 2022-12-26 MED ORDER — ACETAMINOPHEN 325 MG PO TABS
650.0000 mg | ORAL_TABLET | Freq: Once | ORAL | Status: AC
  Administered 2022-12-26: 650 mg via ORAL
  Filled 2022-12-26: qty 2

## 2022-12-26 NOTE — ED Notes (Signed)
[place], DO advised Lactic Acid 2.6

## 2022-12-26 NOTE — ED Triage Notes (Signed)
Patient arrived via EMS from [facility]. EMS reports patient having decreased mobility, increased confusion, and tremors x2 days. Patient normally ambulates and has had trouble standing. EMS reports increased urine output. Patient denies fever, chills, body aches, N/V/D. EMS reports patient has dementia with a 5 minute memory.

## 2022-12-26 NOTE — ED Provider Notes (Addendum)
[facility] Provider Note   CSN: [id number] Arrival date & time: 12/26/22  1323     History  Chief Complaint  Patient presents with   Altered Mental Status    [name] is a 89 y.o. male.  Patient here with some confusion over the last 2 days.  Little bit of weakness.  Found to have fever.  May be some increased urination.  No cough or sputum production or abdominal pain or nausea or vomiting or diarrhea.  No falls.  Nothing makes it worse or better.  History of dementia.  Slightly more confusion.  The history is provided by the patient and a caregiver.       Home Medications Prior to Admission medications   Medication Sig Start Date End Date Taking? Authorizing Provider  abatacept (ORENCIA) 250 MG injection every 30 (thirty) days.    [provider]  acetaminophen (TYLENOL) 500 MG tablet Take 1,000 mg by mouth 2 (two) times daily. Scheduled and 2 by mouth as needed    [provider]  apixaban (ELIQUIS) 5 MG TABS tablet Take 1 tablet (5 mg total) by mouth 2 (two) times daily. 11/30/22   Virgie Dad, MD  docusate sodium (COLACE) 100 MG capsule Take 1 capsule (100 mg total) by mouth 2 (two) times daily. 10/05/22   Barkley Boards R, PA-C  donepezil (ARICEPT) 5 MG tablet TAKE ONE TABLET BY MOUTH EVERY MORNING 11/07/22   Plotnikov, Evie Lacks, MD  escitalopram (LEXAPRO) 20 MG tablet Take 20 mg by mouth daily.    [provider]  Evolocumab (REPATHA SURECLICK) [id number] MG/ML SOAJ INJECT 1 PEN INTO SKIN EVERY 14 DAYS 03/30/21   Sueanne Margarita, MD  ezetimibe (ZETIA) 10 MG tablet Take 5 mg by mouth daily.    [provider]  gabapentin (NEURONTIN) 300 MG capsule Take 1 capsule (300 mg total) by mouth 3 (three) times daily. 10/05/22   Norm Parcel, PA-C  lidocaine 4 % Place 1 patch onto the skin 2 (two) times daily. Left side x 30 days    [provider]  LORazepam (ATIVAN) 0.5 MG tablet Take 0.5  mg by mouth 2 (two) times daily as needed for anxiety.    [provider]  magnesium hydroxide (MILK OF MAGNESIA) 400 MG/5ML suspension Take 45 mLs by mouth as needed for mild constipation.    [provider]  memantine (NAMENDA) 10 MG tablet Take 10 mg by mouth at bedtime.    [provider]  metoprolol tartrate (LOPRESSOR) 25 MG tablet Take 1 tablet (25 mg total) by mouth 2 (two) times daily. 11/09/22   Allred, Jeneen Rinks, MD  nitroGLYCERIN (NITROSTAT) 0.4 MG SL tablet Place 0.4 mg under the tongue every 5 (five) minutes as needed for chest pain.    [provider]  OLANZapine (ZYPREXA) 2.5 MG tablet Take 2.5 mg by mouth at bedtime.    [provider]  omeprazole (PRILOSEC) 40 MG capsule Take 40 mg by mouth daily. Take on empty stomach 30 minutes before breakfast.    [provider]  polyethylene glycol (MIRALAX / GLYCOLAX) 17 g packet Take 17 g by mouth as needed for mild constipation.    [provider]  rosuvastatin (CRESTOR) 5 MG tablet Take 1 tablet (5 mg total) by mouth daily. 10/14/22   Royal Hawthorn, NP  apixaban (ELIQUIS) 2.5 MG TABS tablet Take 1 tablet (2.5 mg total) by mouth 2 (two) times daily.  10/18/22   Royal Hawthorn, NP      Allergies    Methotrexate, Aricept Reather Littler hcl], Crestor [rosuvastatin calcium], Lisinopril, Namenda [memantine], Atorvastatin, and Pravastatin sodium    Review of Systems   Review of Systems  Physical Exam Updated Vital Signs BP 138/76 (BP Location: Right Arm)   Pulse 91   Temp 100.2 F (37.9 C) (Oral)   Resp (!) 23   Ht 5' 10"$  (1.778 m)   Wt 86.2 kg   SpO2 96%   BMI 27.26 kg/m  Physical Exam Vitals and nursing note reviewed.  Constitutional:      General: He is not in acute distress.    Appearance: He is well-developed. He is not ill-appearing.  HENT:     Head: Normocephalic and atraumatic.     Nose: Nose normal.     Mouth/Throat:     Mouth: Mucous membranes are moist.   Eyes:     Extraocular Movements: Extraocular movements intact.     Conjunctiva/sclera: Conjunctivae normal.     Pupils: Pupils are equal, round, and reactive to light.  Cardiovascular:     Rate and Rhythm: Normal rate and regular rhythm.     Pulses: Normal pulses.     Heart sounds: Normal heart sounds. No murmur heard. Pulmonary:     Effort: Pulmonary effort is normal. No respiratory distress.     Breath sounds: Normal breath sounds.  Abdominal:     Palpations: Abdomen is soft.     Tenderness: There is no abdominal tenderness.  Musculoskeletal:        General: No swelling.     Cervical back: Normal range of motion and neck supple.  Skin:    General: Skin is warm and dry.     Capillary Refill: Capillary refill takes less than 2 seconds.  Neurological:     General: No focal deficit present.     Mental Status: He is alert.  Psychiatric:        Mood and Affect: Mood normal.     ED Results / Procedures / Treatments   Labs (all labs ordered are listed, but only abnormal results are displayed) Labs Reviewed  RESP PANEL BY RT-PCR (RSV, FLU A&B, COVID)  [id number] - Abnormal; Notable for the following components:      Result Value   SARS Coronavirus 2 by RT PCR POSITIVE (*)    All other components within normal limits  LACTIC ACID, PLASMA - Abnormal; Notable for the following components:   Lactic Acid, Venous 2.6 (*)    All other components within normal limits  COMPREHENSIVE METABOLIC PANEL - Abnormal; Notable for the following components:   Glucose, Bld 105 (*)    All other components within normal limits  CBC WITH DIFFERENTIAL/PLATELET - Abnormal; Notable for the following components:   RBC 3.95 (*)    Hemoglobin 12.7 (*)    HCT 38.3 (*)    All other components within normal limits  CULTURE, BLOOD (ROUTINE X 2)  CULTURE, BLOOD (ROUTINE X 2)  URINE CULTURE  PROTIME-INR  APTT  LACTIC ACID, PLASMA  URINALYSIS, ROUTINE W REFLEX MICROSCOPIC    EKG EKG  Interpretation  Date/Time:  Sunday December 26 2022 14:32:31 EST Ventricular Rate:  85 PR Interval:  175 QRS Duration: 102 QT Interval:  434 QTC Calculation: 517 R Axis:   -50 Text Interpretation: Sinus rhythm Ventricular premature complex Left axis deviation Prolonged QT interval Confirmed by Lennice Sites ([id number]) on 12/26/2022 2:34:31 PM  Radiology DG Chest Port 1  View  Result Date: 12/26/2022 CLINICAL DATA:  Sepsis EXAM: PORTABLE CHEST 1 VIEW COMPARISON:  10/03/2022 FINDINGS: Normal heart size. Aortic atherosclerosis. Minimal bibasilar interstitial opacities. No pleural effusion or pneumothorax. Redemonstrated left-sided rib fractures. IMPRESSION: Minimal bibasilar interstitial opacities, which may represent atelectasis versus developing atypical/viral infection. Electronically Signed   By: Davina Poke D.O.   On: 12/26/2022 14:15    Procedures .Critical Care  Performed by: Lennice Sites, DO Authorized by: Lennice Sites, DO   Critical care provider statement:    Critical care time (minutes):  35   Critical care was necessary to treat or prevent imminent or life-threatening deterioration of the following conditions:  Sepsis   Critical care was time spent personally by me on the following activities:  Blood draw for specimens, development of treatment plan with patient or surrogate, discussions with primary provider, evaluation of patient's response to treatment, examination of patient, obtaining history from patient or surrogate, ordering and review of laboratory studies, ordering and performing treatments and interventions, ordering and review of radiographic studies, pulse oximetry, re-evaluation of patient's condition and review of old charts   Care discussed with: admitting provider       Medications Ordered in ED Medications  cefTRIAXone (ROCEPHIN) 2 g in sodium chloride 0.9 % 100 mL IVPB (0 g Intravenous Stopped 12/26/22 1514)  azithromycin (ZITHROMAX) 500 mg in sodium  chloride 0.9 % 250 mL IVPB (500 mg Intravenous New Bag/Given 12/26/22 1514)  sodium chloride 0.9 % bolus 1,000 mL (0 mLs Intravenous Stopped 12/26/22 1552)  acetaminophen (TYLENOL) tablet 650 mg (650 mg Oral Given 12/26/22 1426)    ED Course/ Medical Decision Making/ A&P                             Medical Decision Making Amount and/or Complexity of Data Reviewed Labs: ordered. Radiology: ordered. ECG/medicine tests: ordered.  Risk OTC drugs. Decision regarding hospitalization.   [name] is here with confusion.  Found to have fever.  Overall sepsis workup initiated.  He has been having increased confusion over the last day or 2.  History of A-fib, dementia.  Maybe some increased urinary frequency.  Unable to provide much history but he denies any cough or sputum production no nausea, vomiting, abdominal pain.  He has been a little bit more unsteady on his feet but he has not had any falls.  Will pursue sepsis workup with blood cultures, chest x-ray, urine studies.  Will start IV antibiotics and IV fluid bolus and Tylenol.  Per my review and interpretation of labs, chest x-ray with maybe atelectasis versus pneumonia.  COVID-19 is positive.  Major leukocytosis.  No significant electrolyte abnormality.  Overall he is hemodynamically stable.  But sounds like he is little bit more confused than his baseline although he seems pleasant on exam.  He is on Eliquis and he has been unsteady on his feet and weaker than normal and I think it is reasonable to observe him overnight for hydration and have him work with physical therapy.  Not sure he would be a candidate for antivirals given the medications that he is on but will have pharmacy look into that.  He is already been given some IV antibiotics.  Possible that we can discontinue that.  Seems like his symptoms have been for the last 2 days.  Talked with Dr. Linda Hedges with hospitalist service.  Working to try to contact [facility] to see if  they can  provide some extra support for the patient as he does not appear to be septic and just has COVID-19 with no hypoxia.  No major electrolyte abnormalities.  He is already received IV fluids.  Possible that we can get him back to his facility if they can provide a little bit of more support with ADLs.  Case manager to reach out to facility.  [place] improved.  Urinalysis negative for infection.  Will discharge back to facility with [facility] supportive care.  Understands return precautions.  This chart was dictated using voice recognition software.  Despite best efforts to proofread,  errors can occur which can change the documentation meaning.         Final Clinical Impression(s) / ED Diagnoses Final diagnoses:  Altered mental status, unspecified altered mental status type  COVID-19    Rx / DC Orders ED Discharge Orders     None         Lennice Sites, DO 12/26/22 [place], [place], DO 12/26/22 [place], [place], DO 12/26/22 1745

## 2022-12-26 NOTE — Progress Notes (Signed)
[place] following for sepsis protocol. 

## 2022-12-26 NOTE — Discharge Instructions (Addendum)
Recommend 1000 mg of Tylenol every 6 hours as needed for fever.  Please return if symptoms worsen.

## 2022-12-27 ENCOUNTER — Non-Acute Institutional Stay (SKILLED_NURSING_FACILITY): Payer: Medicare Other | Admitting: Internal Medicine

## 2022-12-27 DIAGNOSIS — G308 Other Alzheimer's disease: Secondary | ICD-10-CM

## 2022-12-27 DIAGNOSIS — I480 Paroxysmal atrial fibrillation: Secondary | ICD-10-CM

## 2022-12-27 DIAGNOSIS — S065XAA Traumatic subdural hemorrhage with loss of consciousness status unknown, initial encounter: Secondary | ICD-10-CM

## 2022-12-27 DIAGNOSIS — F02B3 Dementia in other diseases classified elsewhere, moderate, with mood disturbance: Secondary | ICD-10-CM | POA: Diagnosis not present

## 2022-12-27 DIAGNOSIS — M069 Rheumatoid arthritis, unspecified: Secondary | ICD-10-CM

## 2022-12-27 DIAGNOSIS — E782 Mixed hyperlipidemia: Secondary | ICD-10-CM

## 2022-12-27 DIAGNOSIS — U071 COVID-19: Secondary | ICD-10-CM | POA: Diagnosis not present

## 2022-12-27 DIAGNOSIS — F3341 Major depressive disorder, recurrent, in partial remission: Secondary | ICD-10-CM | POA: Diagnosis not present

## 2022-12-27 DIAGNOSIS — G4733 Obstructive sleep apnea (adult) (pediatric): Secondary | ICD-10-CM

## 2022-12-27 DIAGNOSIS — R296 Repeated falls: Secondary | ICD-10-CM

## 2022-12-27 LAB — URINE CULTURE: Culture: <10000 — AB

## 2022-12-27 MED ORDER — NIRMATRELVIR/RITONAVIR (PAXLOVID)TABLET: 3.0000 | ORAL_TABLET | Freq: Two times a day (BID) | ORAL | 0 refills | Status: DC

## 2022-12-27 NOTE — Progress Notes (Unsigned)
Location: Occupational psychologist of Service:  SNF (31)  Provider:   Code Status: DNR Goals of Care:     12/26/2022    1:41 PM  Advanced Directives  Does Patient Have a Medical Advance Directive? Yes  Type of Paramedic of [place];Living will     Chief Complaint  Patient presents with   Acute Visit    HPI: Patient is a 89 y.o. male seen today for an acute visit for Admit in Memory Unit for respite and  Covid Isolation  Patient lives in [place] in [place] with his wife  Patient has h/o Dementia with behavior issues, Rheumatoid Arthritis, A Fib, HTN, CAD s/p PCI, GERD and Sleep apnea And Depression   SDH after fall in 11/23  Patient went to ED for Incrased Confusion and weakness Found to be Covid Positive Chest Xray Negative Send back in Memory unit to stay in Isolation Patient had no acute issue No Cough Does look weak Confused about where he is  Looking for his wife No Fever  No chest pain or Chills or SOB   Past Medical History:  Diagnosis Date   CAD (coronary artery disease)    a. s/p inferior STEMI 01/08/2014; LHC 01/08/14: total RCA occlusion s/p 3.5x6m Xience DES distal RCA and 3.5x28 mm DES mid RCA, 60-70% mid LAD stenosis, EF 55%.   Complication of anesthesia    'long to wake up after back surgery " 09/2003   Diverticulosis of colon    GERD (gastroesophageal reflux disease)    History of cellulitis    05-26-2015  LLE   History of colon polyps    1998- benign/  2008 adenomatous    History of kidney stones    2013   History of squamous cell carcinoma excision    2013;  2015;  06-12-2015 right leg/  02/ and 05/ 2017  left ear and left leg   Hydronephrosis, left    Hyperlipidemia    Hypertension    Migraine with aura    OSA on CPAP 06/19/2015   Moderate OSA with AHI 18/hr  per study 05-20-2015   Osteoarthritis    Paroxysmal atrial fibrillation ([place] 4/16   chads2vasc score is at least 4   Premature atrial contractions     RA (rheumatoid arthritis) ([facility]    rheumatologist-  dr jLeigh Aurora  Sinusitis, chronic 10/17/2015   Wears glasses    Wears hearing aid    bilateral    Past Surgical History:  Procedure Laterality Date   BACK SURGERY     CARDIOVASCULAR STRESS TEST  11/21/2015   Low risk nuclear study w/ a small diaphragmatic attenuation artifact, no ischemia/  normal LV function and wall motion , ef 63%   CATARACT EXTRACTION W/ INTRAOCULAR LENS  IMPLANT, BILATERAL  2015   COLONOSCOPY  last one 06-09-2011   CORONARY ANGIOPLASTY     CYSTOSCOPY W/ RETROGRADES Left 07/12/2016   Procedure: CYSTOSCOPY WITH RETROGRADE PYELOGRAM LEFT URETERAL STENT;  Surgeon: JIrine Seal MD;  Location: [facility];  Service: Urology;  Laterality: Left;   CYSTOSCOPY WITH RETROGRADE PYELOGRAM, URETEROSCOPY AND STENT PLACEMENT Left 08/11/2016   Procedure: CYSTOSCOPY WITH RETROGRADE PYELOGRAM,  DIAGNOSTIC URETEROSCOPY , STENT EXCHANGE;  Surgeon: TAlexis Frock MD;  [facility]  Service: Urology;  Laterality: Left;   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   severe fibromatosis palm and fingers   LEFT HEART CATHETERIZATION WITH CORONARY ANGIOGRAM N/A 01/08/2014   Procedure:  LEFT HEART CATHETERIZATION WITH CORONARY ANGIOGRAM;  Surgeon: Troy Sine, MD;  Location: [facility] CATH LAB;  Service: Cardiovascular;  Laterality:N/A;  total/ subtotal RCA/  mLAD [id number] w/ mid systolic bridging/  preserved global LVF, ef 55%   MOHS SURGERY  x2  feb and may 2017   left ear /  left leg  ([facility])   PERCUTANEOUS CORONARY STENT INTERVENTION (PCI-S)  01/08/2014   Procedure: PERCUTANEOUS CORONARY STENT INTERVENTION (PCI-S);  Surgeon: Troy Sine, MD;  Location: [facility] CATH LAB;  Service: Cardiovascular;;  DES to mid and distal RCA   POSTERIOR LUMBAR FUSION  10/01/2003   and Laminectomy/ diskectomy  L4 -- S1   ROBOT ASSISTED PYELOPLASTY Left 09/15/2016   Procedure: XI ROBOTIC ASSISTED PYELOPLASTY;  Surgeon: Alexis Frock, MD;   Location: [facility];  Service: Urology;  Laterality: Left;   TONSILLECTOMY     TRANSTHORACIC ECHOCARDIOGRAM  02/23/2016   mild LVH,  ef 50-55%,  grade 1 diastolic dysfunction/  mild to moderate AV calcification w/ no stenosis or regurg./  trivial MR and TR/  mild PR    Allergies  Allergen Reactions   Methotrexate Other (See Comments)    REACTION: tachycardia   Aricept [Donepezil Hcl]     nightmares    Crestor [Rosuvastatin Calcium]     Myalgias with 88m dose   Lisinopril Other (See Comments)    cough   Namenda [Memantine]     n/d   Atorvastatin Other (See Comments)    Muscle pain, leg cramps   Pravastatin Sodium Other (See Comments)    REACTION: cramps, fatigue    Outpatient Encounter Medications as of 12/27/2022  Medication Sig   [DISCONTINUED] nirmatrelvir/ritonavir (PAXLOVID) 20 x 150 MG & 10 x 100MG TABS Take 3 tablets by mouth 2 (two) times daily for 5 days. (Take nirmatrelvir 150 mg two tablets twice daily for 5 days and ritonavir 100 mg one tablet twice daily for 5 days) Patient GFR is   abatacept (ORENCIA) 250 MG injection every 30 (thirty) days.   acetaminophen (TYLENOL) 500 MG tablet Take 1,000 mg by mouth 2 (two) times daily. Scheduled and 2 by mouth as needed   apixaban (ELIQUIS) 5 MG TABS tablet Take 1 tablet (5 mg total) by mouth 2 (two) times daily.   docusate sodium (COLACE) 100 MG capsule Take 1 capsule (100 mg total) by mouth 2 (two) times daily.   donepezil (ARICEPT) 5 MG tablet TAKE ONE TABLET BY MOUTH EVERY MORNING   escitalopram (LEXAPRO) 20 MG tablet Take 20 mg by mouth daily.   Evolocumab (REPATHA SURECLICK) [id number]/ML SOAJ INJECT 1 PEN INTO SKIN EVERY 14 DAYS   ezetimibe (ZETIA) 10 MG tablet Take 5 mg by mouth daily.   gabapentin (NEURONTIN) 300 MG capsule Take 1 capsule (300 mg total) by mouth 3 (three) times daily.   lidocaine 4 % Place 1 patch onto the skin 2 (two) times daily. Left side x 30 days   LORazepam (ATIVAN) 0.5 MG tablet Take 0.5 mg by mouth 2  (two) times daily as needed for anxiety.   magnesium hydroxide (MILK OF MAGNESIA) 400 MG/5ML suspension Take 45 mLs by mouth as needed for mild constipation.   memantine (NAMENDA) 10 MG tablet Take 10 mg by mouth at bedtime.   metoprolol tartrate (LOPRESSOR) 25 MG tablet Take 1 tablet (25 mg total) by mouth 2 (two) times daily.   nitroGLYCERIN (NITROSTAT) 0.4 MG SL tablet Place 0.4 mg under the tongue every 5 (five) minutes as needed  for chest pain.   OLANZapine (ZYPREXA) 2.5 MG tablet Take 2.5 mg by mouth at bedtime.   omeprazole (PRILOSEC) 40 MG capsule Take 40 mg by mouth daily. Take on empty stomach 30 minutes before breakfast.   polyethylene glycol (MIRALAX / GLYCOLAX) 17 g packet Take 17 g by mouth as needed for mild constipation.   rosuvastatin (CRESTOR) 5 MG tablet Take 1 tablet (5 mg total) by mouth daily.   [DISCONTINUED] apixaban (ELIQUIS) 2.5 MG TABS tablet Take 1 tablet (2.5 mg total) by mouth 2 (two) times daily.   No facility-administered encounter medications on file as of 12/27/2022.    Review of Systems:  Review of Systems  Unable to perform ROS: Dementia    Health Maintenance  Topic Date Due   COVID-19 Vaccine (4 - 2023-24 season) 07/16/2022   Medicare Annual Wellness (AWV)  06/26/2023   DTaP/Tdap/Td (3 - Td or Tdap) 09/22/2032   Pneumonia Vaccine 24+ Years old  Completed   INFLUENZA VACCINE  Completed   Zoster Vaccines- Shingrix  Completed   HPV VACCINES  Aged Out    Physical Exam: Vitals:   12/27/22 0952  BP: (!) 160/90  Pulse: 68  Resp: 18  Temp: (!) 97.2 F (36.2 C)   There is no height or weight on file to calculate BMI. Physical Exam Vitals reviewed.  Constitutional:      Appearance: Normal appearance.  HENT:     Head: Normocephalic.     Nose: Nose normal.     Mouth/Throat:     Mouth: Mucous membranes are moist.     Pharynx: Oropharynx is clear.  Eyes:     Pupils: Pupils are equal, round, and reactive to light.  Cardiovascular:     Rate and  Rhythm: Normal rate and regular rhythm.     Pulses: Normal pulses.     Heart sounds: No murmur heard. Pulmonary:     Effort: Pulmonary effort is normal. No respiratory distress.     Breath sounds: Normal breath sounds. No rales.  Abdominal:     General: Abdomen is flat. Bowel sounds are normal.     Palpations: Abdomen is soft.  Musculoskeletal:        General: No swelling.     Cervical back: Neck supple.  Skin:    General: Skin is warm.  Neurological:     General: No focal deficit present.     Mental Status: He is alert.  Psychiatric:        Mood and Affect: Mood normal.        Thought Content: Thought content normal.     Labs reviewed: Basic Metabolic Panel: Recent Labs    10/04/22 0552 10/05/22 0243 10/11/22 0600 12/26/22 1414  NA 142 139 143 136  K 3.8 3.8 4.1 3.6  CL 106 107 107 102  CO2 26 23 25* 22  GLUCOSE 97 109*  --  105*  BUN 12 14 16 11  $ CREATININE 0.95 0.93 0.9 0.90  CALCIUM 8.8* 8.7* 8.8 9.0   Liver Function Tests: Recent Labs    07/29/22 1345 10/02/22 0350 10/11/22 0600 12/26/22 1414  AST 17 24 21 30  $ ALT 13 16 13 12  $ ALKPHOS 58 61 89 85  BILITOT 0.5 0.6  --  1.0  PROT 7.7 7.0  --  7.8  ALBUMIN 4.1 4.0 3.7 3.8   No results for input(s): "LIPASE", "AMYLASE" in the last 8760 hours. No results for input(s): "AMMONIA" in the last 8760 hours. CBC: Recent Labs  07/29/22 1345 09/24/22 0000 10/04/22 0552 10/05/22 0243 10/11/22 0600 12/26/22 1414  WBC 5.3   < > 6.1 6.4 5.4 8.1  NEUTROABS 2.7  --   --   --   --  6.2  HGB 12.8*   < > 11.3* 10.8* 10.8* 12.7*  HCT 38.2*   < > 33.7* 32.2* 32* 38.3*  MCV 99.3   < > 98.5 98.8  --  97.0  PLT 274.0   < > 296 277  --  272   < > = values in this interval not displayed.   Lipid Panel: No results for input(s): "CHOL", "HDL", "LDLCALC", "TRIG", "CHOLHDL", "LDLDIRECT" in the last 8760 hours. Lab Results  Component Value Date   HGBA1C 6.4 08/30/2017    Procedures since last visit: DG Chest Port  1 View  Result Date: 12/26/2022 CLINICAL DATA:  Sepsis EXAM: PORTABLE CHEST 1 VIEW COMPARISON:  10/03/2022 FINDINGS: Normal heart size. Aortic atherosclerosis. Minimal bibasilar interstitial opacities. No pleural effusion or pneumothorax. Redemonstrated left-sided rib fractures. IMPRESSION: Minimal bibasilar interstitial opacities, which may represent atelectasis versus developing atypical/viral infection. Electronically Signed   By: Davina Poke D.O.   On: 12/26/2022 14:15    Assessment/Plan 1. COVID-19 virus infection Will start on Paxlovid Isolation for 10 days  2. Moderate Alzheimer's dementia of other onset with mood disturbance ([place]) On Aricept Namenda Zyprexa Ativan Prn D/w Social worker ot keep him in Memory unit if possible as it has been difficult for wife to manage him in apartment  3. Paroxysmal atrial fibrillation (HCC) On Eliquis Dose 2.5 mg BID when on  Paxlovid  4. Rheumatoid arthritis involving elbow, unspecified laterality, unspecified whether rheumatoid factor present ([place]) [place]  5. OSA (obstructive sleep apnea) Does not use his  CPAP  6. Subdural hematoma (HCC) Resolved  7. Mixed hyperlipidemia On Repathat,Zetia and statin  8. Falls frequently Duue to his cognition and Forgets to use his walker  9. Recurrent major depressive disorder, in partial remission (HCC) On Lexapro 10 Hypertension Metoprolol    Labs/tests ordered:  * No order type specified * Next appt:  Visit date not found

## 2022-12-28 ENCOUNTER — Encounter: Payer: Self-pay | Admitting: Internal Medicine

## 2022-12-31 ENCOUNTER — Non-Acute Institutional Stay (SKILLED_NURSING_FACILITY): Payer: Medicare Other | Admitting: Adult Health

## 2022-12-31 ENCOUNTER — Encounter: Payer: Self-pay | Admitting: Adult Health

## 2022-12-31 DIAGNOSIS — F03911 Unspecified dementia, unspecified severity, with agitation: Secondary | ICD-10-CM | POA: Diagnosis not present

## 2022-12-31 LAB — CULTURE, BLOOD (ROUTINE X 2)
Culture: NO GROWTH
Culture: NO GROWTH
Special Requests: ADEQUATE

## 2022-12-31 MED ORDER — TRIAMCINOLONE ACETONIDE 0.1 % EX CREA: 1.0000 | TOPICAL_CREAM | Freq: Two times a day (BID) | CUTANEOUS | 0 refills | Status: AC

## 2022-12-31 MED ORDER — EUCERIN ORIGINAL HEALING EX CREA: TOPICAL_CREAM | Freq: Two times a day (BID) | CUTANEOUS | 0 refills | Status: AC

## 2022-12-31 NOTE — Progress Notes (Signed)
Location:  [facility] Room Number: [id number] Place of Service:  SNF [phone]) Provider:  Royal Hawthorn, NP  Virgie Dad, MD  Patient Care Team: Virgie Dad, MD as PCP - General (Internal Medicine) Sueanne Margarita, MD as PCP - Cardiology (Cardiology) Thompson Grayer, MD (Inactive) as PCP - Electrophysiology (Cardiology) Irene Shipper, MD (Gastroenterology) Thompson Grayer, MD (Inactive) (Cardiology) Hennie Duos, MD as Consulting Physician (Rheumatology) Alexis Frock, MD as Consulting Physician (Urology) Katy Apo, MD as Consulting Physician (Ophthalmology) Mathis Fare (Dentistry) Tiajuana Amass, MD as Referring Physician (Allergy and Immunology) Cameron Sprang, MD as Consulting Physician (Neurology) Charlton Haws, [facility] as Pharmacist (Pharmacist) Cameron Sprang, MD as Consulting Physician (Neurology)  Extended Emergency Contact Information Primary Emergency Contact: Swingle,[name] Address: [place], [place] Phone: [phone] Work Phone: [phone] Mobile Phone: [phone] Relation: Spouse Secondary Emergency Contact: [place] Mobile Phone: [phone] Relation: Daughter  Code Status:  DNR Goals of care: Advanced Directive information    12/31/2022    9:41 AM  Advanced Directives  Does Patient Have a Medical Advance Directive? Yes  Type of Paramedic of [place];Living will  Does patient want to make changes to medical advance directive? No - Patient declined  Copy of [place] in Chart? Yes - validated most recent copy scanned in chart (See row information)     Chief Complaint  Patient presents with   Acute Visit    Agitation    HPI:  Pt is a 89 y.o. male seen today for an acute visit for agitation.   Mr. Wohler has underlying dementia and came to memory care on 2/12 due to covid infection and weakness. He  is having trouble sleeping at night, becoming easily angered with staff and threatening to cause physical harm. The isolation and change of environment are difficult for him. Staff are using ativan for the agitation. Sometimes this makes him weaker. Gait is unsteady at times.   He will come off isolation 2/21.  Has some cough and congestion. Doing well with appetite. Not on oxygen or having sob. Vitals ok  Has itchy dry skin and rash to chest and back.    Past Medical History:  Diagnosis Date   CAD (coronary artery disease)    a. s/p inferior STEMI 01/08/2014; LHC 01/08/14: total RCA occlusion s/p 3.5x49m Xience DES distal RCA and 3.5x28 mm DES mid RCA, 60-70% mid LAD stenosis, EF 55%.   Complication of anesthesia    'long to wake up after back surgery " 09/2003   Diverticulosis of colon    GERD (gastroesophageal reflux disease)    History of cellulitis    05-26-2015  LLE   History of colon polyps    1998- benign/  2008 adenomatous    History of kidney stones    2013   History of squamous cell carcinoma excision    2013;  2015;  06-12-2015 right leg/  02/ and 05/ 2017  left ear and left leg   Hydronephrosis, left    Hyperlipidemia    Hypertension    Migraine with aura    OSA on CPAP 06/19/2015   Moderate OSA with AHI 18/hr  per study 05-20-2015   Osteoarthritis    Paroxysmal atrial fibrillation [place] 4/16   chads2vasc score is at least 4   Premature atrial contractions    RA (rheumatoid arthritis) ([place]  rheumatologist-  dr Leigh Aurora   Sinusitis, chronic 10/17/2015   Wears glasses    Wears hearing aid    bilateral   Past Surgical History:  Procedure Laterality Date   BACK SURGERY     CARDIOVASCULAR STRESS TEST  11/21/2015   Low risk nuclear study w/ a small diaphragmatic attenuation artifact, no ischemia/  normal LV function and wall motion , ef 63%   CATARACT EXTRACTION W/ INTRAOCULAR LENS  IMPLANT, BILATERAL  2015   COLONOSCOPY  last one 06-09-2011   CORONARY  ANGIOPLASTY     CYSTOSCOPY W/ RETROGRADES Left 07/12/2016   Procedure: CYSTOSCOPY WITH RETROGRADE PYELOGRAM LEFT URETERAL STENT;  Surgeon: Irine Seal, MD;  Location: [facility];  Service: Urology;  Laterality: Left;   CYSTOSCOPY WITH RETROGRADE PYELOGRAM, URETEROSCOPY AND STENT PLACEMENT Left 08/11/2016   Procedure: CYSTOSCOPY WITH RETROGRADE PYELOGRAM,  DIAGNOSTIC URETEROSCOPY , STENT EXCHANGE;  Surgeon: Alexis Frock, MD;  Location[facility];  Service: Urology;  Laterality: Left;   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   severe fibromatosis palm and fingers   LEFT HEART CATHETERIZATION WITH CORONARY ANGIOGRAM N/A 01/08/2014   Procedure: LEFT HEART CATHETERIZATION WITH CORONARY ANGIOGRAM;  Surgeon: Troy Sine, MD;  Location: [facility] CATH LAB;  Service: Cardiovascular;  Laterality:N/A;  total/ subtotal RCA/  mLAD [id number] w/ mid systolic bridging/  preserved global LVF, ef 55%   MOHS SURGERY  x2  feb and may 2017   left ear /  left leg  (S[facility])   PERCUTANEOUS CORONARY STENT INTERVENTION (PCI-S)  01/08/2014   Procedure: PERCUTANEOUS CORONARY STENT INTERVENTION (PCI-S);  Surgeon: Troy Sine, MD;  Location: [facility] C[facility] LAB;  Service: Cardiovascular;;  DES to mid and distal RCA   POSTERIOR LUMBAR FUSION  10/01/2003   and Laminectomy/ diskectomy  L4 -- S1   ROBOT ASSISTED PYELOPLASTY Left 09/15/2016   Procedure: XI ROBOTIC ASSISTED PYELOPLASTY;  Surgeon: Alexis Frock, MD;  Location: [facility];  Service: Urology;  Laterality: Left;   TONSILLECTOMY     TRANSTHORACIC ECHOCARDIOGRAM  02/23/2016   mild LVH,  ef 50-55%,  grade 1 diastolic dysfunction/  mild to moderate AV calcification w/ no stenosis or regurg./  trivial MR and TR/  mild PR    Allergies  Allergen Reactions   Methotrexate Other (See Comments)    REACTION: tachycardia   Aricept [Donepezil Hcl]     nightmares    Crestor [Rosuvastatin Calcium]     Myalgias with 53m dose   Lisinopril Other (See Comments)    cough    Namenda [Memantine]     n/d   Atorvastatin Other (See Comments)    Muscle pain, leg cramps   Pravastatin Sodium Other (See Comments)    REACTION: cramps, fatigue    Outpatient Encounter Medications as of 12/31/2022  Medication Sig   acetaminophen (TYLENOL) 500 MG tablet Take 1,000 mg by mouth 2 (two) times daily. Scheduled and 2 by mouth as needed   apixaban (ELIQUIS) 5 MG TABS tablet Take 1 tablet (5 mg total) by mouth 2 (two) times daily.   donepezil (ARICEPT) 5 MG tablet TAKE ONE TABLET BY MOUTH EVERY MORNING   escitalopram (LEXAPRO) 20 MG tablet Take 20 mg by mouth daily.   Evolocumab (REPATHA SURECLICK) [id number]/ML SOAJ INJECT 1 PEN INTO SKIN EVERY 14 DAYS   ezetimibe (ZETIA) 10 MG tablet Take 5 mg by mouth daily.   gabapentin (NEURONTIN) 300 MG capsule Take 1 capsule (300 mg total) by mouth 3 (three) times daily.  lidocaine 4 % Place 1 patch onto the skin 2 (two) times daily. Left side x 30 days   LORazepam (ATIVAN) 0.5 MG tablet Take 0.5 mg by mouth 2 (two) times daily as needed for anxiety.   magnesium hydroxide (MILK OF MAGNESIA) 400 MG/5ML suspension Take 45 mLs by mouth as needed for mild constipation.   memantine (NAMENDA) 10 MG tablet Take 10 mg by mouth at bedtime.   metoprolol tartrate (LOPRESSOR) 25 MG tablet Take 1 tablet (25 mg total) by mouth 2 (two) times daily.   nitroGLYCERIN (NITROSTAT) 0.4 MG SL tablet Place 0.4 mg under the tongue every 5 (five) minutes as needed for chest pain.   OLANZapine (ZYPREXA) 2.5 MG tablet Take 2.5 mg by mouth at bedtime.   omeprazole (PRILOSEC) 40 MG capsule Take 40 mg by mouth daily. Take on empty stomach 30 minutes before breakfast.   rosuvastatin (CRESTOR) 5 MG tablet Take 1 tablet (5 mg total) by mouth daily.   abatacept (ORENCIA) 250 MG injection every 30 (thirty) days. (Patient not taking: Reported on 12/31/2022)   docusate sodium (COLACE) 100 MG capsule Take 1 capsule (100 mg total) by mouth 2 (two) times daily. (Patient not taking:  Reported on 12/31/2022)   polyethylene glycol (MIRALAX / GLYCOLAX) 17 g packet Take 17 g by mouth as needed for mild constipation. (Patient not taking: Reported on 12/31/2022)   [DISCONTINUED] apixaban (ELIQUIS) 2.5 MG TABS tablet Take 1 tablet (2.5 mg total) by mouth 2 (two) times daily.   No facility-administered encounter medications on file as of 12/31/2022.    Review of Systems  Constitutional:  Negative for activity change, appetite change, chills, diaphoresis, fatigue, fever and unexpected weight change.  HENT:  Positive for congestion.   Respiratory:  Positive for cough. Negative for shortness of breath, wheezing and stridor.   Cardiovascular:  Negative for chest pain, palpitations and leg swelling.  Gastrointestinal:  Negative for abdominal distention, abdominal pain, constipation and diarrhea.  Genitourinary:  Negative for difficulty urinating and dysuria.  Musculoskeletal:  Positive for gait problem. Negative for arthralgias, back pain, joint swelling and myalgias.  Neurological:  Negative for dizziness, seizures, syncope, facial asymmetry, speech difficulty, weakness and headaches.  Hematological:  Negative for adenopathy. Does not bruise/bleed easily.  Psychiatric/Behavioral:  Positive for agitation, behavioral problems and confusion.     Immunization History  Administered Date(s) Administered   Fluad Quad(high Dose 65+) 07/09/2019, 08/15/2020, 09/16/2021, 07/29/2022   Influenza Split 08/10/2011, 08/15/2012   Influenza Whole 08/21/2009, 09/22/2010   Influenza, High Dose Seasonal PF 09/16/2015, 08/18/2016, 08/31/2017, 08/14/2018   Influenza,inj,Quad PF,6+ Mos 08/06/2013   Influenza-Unspecified 08/31/2017   PFIZER(Purple Top)SARS-COV-2 Vaccination 12/11/2019, 01/01/2020, 08/12/2020   Pneumococcal Conjugate-13 10/02/2013   Pneumococcal Polysaccharide-23 06/04/2010   Tdap 10/22/2011, 09/22/2022   Zoster Recombinat (Shingrix) 03/09/2017, 05/10/2017   Pertinent  Health  Maintenance Due  Topic Date Due   INFLUENZA VACCINE  Completed      10/05/2022    3:40 PM 10/12/2022    9:31 AM 11/01/2022    2:52 PM 11/02/2022    9:04 AM 11/30/2022    9:57 AM  Fall Risk  Falls in the past year? 1 1 1 1 1  $ Was there an injury with Fall? 1 1 1 1 1  $ Fall Risk Category Calculator 3 3 3 3 2  $ Fall Risk Category (Retired) [employer]   (RETIRED) Patient Fall Risk Level High fall risk High fall risk High fall risk High fall risk   Patient at  Risk for Falls Due to History of fall(s);Impaired balance/gait History of fall(s);Impaired balance/gait;Impaired mobility  History of fall(s);Impaired balance/gait;Impaired mobility No Fall Risks  Fall risk Follow up Falls evaluation completed Falls evaluation completed Falls evaluation completed Falls evaluation completed Falls evaluation completed   Functional Status Survey:    Vitals:   12/31/22 0925  BP: (!) 165/90  Pulse: 60  Resp: 16  Temp: 97.6 F (36.4 C)  TempSrc: Temporal  SpO2: 96%  Weight: 186 lb 9.6 oz (84.6 kg)  Height: 5' 10"$  (1.778 m)   Body mass index is 26.77 kg/m. Physical Exam Vitals and nursing note reviewed.  Constitutional:      General: He is not in acute distress.    Appearance: He is not diaphoretic.  HENT:     Head: Normocephalic and atraumatic.  Neck:     Thyroid: No thyromegaly.     Vascular: No JVD.     Trachea: No tracheal deviation.  Cardiovascular:     Rate and Rhythm: Normal rate and regular rhythm.     Heart sounds: No murmur heard. Pulmonary:     Effort: Pulmonary effort is normal. No respiratory distress.     Breath sounds: Normal breath sounds. No wheezing.  Abdominal:     General: Bowel sounds are normal. There is no distension.     Palpations: Abdomen is soft.     Tenderness: There is no abdominal tenderness.  Musculoskeletal:     Cervical back: Normal range of motion and neck supple.  Lymphadenopathy:     Cervical: No cervical adenopathy.  Skin:    General:  Skin is warm and dry.     Comments: Maculopapular rash to chest and back  Neurological:     General: No focal deficit present.     Mental Status: He is alert. Mental status is at baseline.     Cranial Nerves: No cranial nerve deficit.  Psychiatric:        Mood and Affect: Mood normal.     Labs reviewed: Recent Labs    10/04/22 0552 10/05/22 0243 10/11/22 0600 12/26/22 1414  NA 142 139 143 136  K 3.8 3.8 4.1 3.6  CL 106 107 107 102  CO2 26 23 25* 22  GLUCOSE 97 109*  --  105*  BUN 12 14 16 11  $ CREATININE 0.95 0.93 0.9 0.90  CALCIUM 8.8* 8.7* 8.8 9.0   Recent Labs    07/29/22 1345 10/02/22 0350 10/11/22 0600 12/26/22 1414  AST 17 24 21 30  $ ALT 13 16 13 12  $ ALKPHOS 58 61 89 85  BILITOT 0.5 0.6  --  1.0  PROT 7.7 7.0  --  7.8  ALBUMIN 4.1 4.0 3.7 3.8   Recent Labs    07/29/22 1345 09/24/22 0000 10/04/22 0552 10/05/22 0243 10/11/22 0600 12/26/22 1414  WBC 5.3   < > 6.1 6.4 5.4 8.1  NEUTROABS 2.7  --   --   --   --  6.2  HGB 12.8*   < > 11.3* 10.8* 10.8* 12.7*  HCT 38.2*   < > 33.7* 32.2* 32* 38.3*  MCV 99.3   < > 98.5 98.8  --  97.0  PLT 274.0   < > 296 277  --  272   < > = values in this interval not displayed.   Lab Results  Component Value Date   TSH 1.18 12/13/2017   Lab Results  Component Value Date   HGBA1C 6.4 08/30/2017   Lab Results  Component Value Date   CHOL 197 08/04/2020   HDL 47 08/04/2020   LDLCALC 106 (H) 08/04/2020   LDLDIRECT 168.9 05/24/2013   TRIG 254 (H) 08/04/2020   CHOLHDL 4.2 08/04/2020    Significant Diagnostic Results in last 30 days:  DG Chest Port 1 View  Result Date: 12/26/2022 CLINICAL DATA:  Sepsis EXAM: PORTABLE CHEST 1 VIEW COMPARISON:  10/03/2022 FINDINGS: Normal heart size. Aortic atherosclerosis. Minimal bibasilar interstitial opacities. No pleural effusion or pneumothorax. Redemonstrated left-sided rib fractures. IMPRESSION: Minimal bibasilar interstitial opacities, which may represent atelectasis versus  developing atypical/viral infection. Electronically Signed   By: Davina Poke D.O.   On: 12/26/2022 14:15    Assessment/Plan  1. Agitation due to dementia (HCC) Increase Zyprexa to 5 mg qhs Fall prec  2. Covid infection  Has mild cough and congestion Lung clear  On RA On Paxlovid Off isolation 2/21 May come out of room and go out side to help avoid increased agitation with mask on  3. Rash Triamcinolone 0.01 % mixed with Eucerin 1:1 bid x 7 days and the prn  Family/ staff Communication: nurse  Labs/tests ordered:  NA

## 2023-01-05 ENCOUNTER — Telehealth: Payer: Self-pay

## 2023-01-05 ENCOUNTER — Telehealth: Payer: Self-pay | Admitting: Student

## 2023-01-05 NOTE — Telephone Encounter (Signed)
Return call to [facility] with [facility]. Patient recently transferred from independent living to SNF. Reviewed medication list with nurse. Unable to verify last dose of Repatha. Nurse will follow up with patient's wife.

## 2023-01-05 NOTE — Telephone Encounter (Signed)
Pt c/o medication issue:  1. Name of Medication: Evolocumab (REPATHA SURECLICK) [id number] MG/ML SOAJ   2. How are you currently taking this medication (dosage and times per day)?   3. Are you having a reaction (difficulty breathing--STAT)?   4. What is your medication issue? [facility] is calling to get information as to when this medication was last given to the patient. Please advise.

## 2023-01-05 NOTE — Telephone Encounter (Signed)
Message left on clinical intake voicemail:   Mliss Sax, nurse at [facility] needs a returned call as soon as possible to discuss when patient received his last repatha injection.  Call was returned to [facility] and I advised this is a skilled resident and we do not inject his repatha. I suggested that [facility] consult with patients cardiologist @ [phone] or with his nurse on site (at [facility]) who would hold the radical responsibility of managing patients medications. Bernadette verbalized understanding.

## 2023-01-11 ENCOUNTER — Encounter: Payer: Self-pay | Admitting: Orthopedic Surgery

## 2023-01-11 ENCOUNTER — Non-Acute Institutional Stay (SKILLED_NURSING_FACILITY): Payer: Medicare Other | Admitting: Orthopedic Surgery

## 2023-01-11 DIAGNOSIS — F03911 Unspecified dementia, unspecified severity, with agitation: Secondary | ICD-10-CM

## 2023-01-11 DIAGNOSIS — F02B3 Dementia in other diseases classified elsewhere, moderate, with mood disturbance: Secondary | ICD-10-CM

## 2023-01-11 NOTE — Progress Notes (Signed)
Location:   [place] Room Number: [id number] Place of Service:  SNF [phone]) Provider:  Windell Moulding, NP  PCP: Virgie Dad, MD  Patient Care Team: Virgie Dad, MD as PCP - General (Internal Medicine) Sueanne Margarita, MD as PCP - Cardiology (Cardiology) Thompson Grayer, MD (Inactive) as PCP - Electrophysiology (Cardiology) Irene Shipper, MD (Gastroenterology) Thompson Grayer, MD (Inactive) (Cardiology) Hennie Duos, MD as Consulting Physician (Rheumatology) Alexis Frock, MD as Consulting Physician (Urology) Katy Apo, MD as Consulting Physician (Ophthalmology) Mathis Fare (Dentistry) Tiajuana Amass, MD as Referring Physician (Allergy and Immunology) Cameron Sprang, MD as Consulting Physician (Neurology) Charlton Haws, [facility] as Pharmacist (Pharmacist) Cameron Sprang, MD as Consulting Physician (Neurology)  Extended Emergency Contact Information Primary Emergency Contact: Lieber,[name] Address: [place], [place] Phone: [phone] Work Phone: [phone] Mobile Phone: [phone] Relation: Spouse Secondary Emergency Contact: [place] Mobile Phone: [phone] Relation: Daughter  Code Status:  DNR Goals of care: Advanced Directive information    01/11/2023   10:13 AM  Advanced Directives  Does Patient Have a Medical Advance Directive? Yes  Type of Paramedic of [place];Living will;Out of facility DNR (pink MOST or yellow form)  Does patient want to make changes to medical advance directive? No - Patient declined  Copy of [place] in Chart? Yes - validated most recent copy scanned in chart (See row information)     Chief Complaint  Patient presents with   Acute Visit    Agitation.    HPI:  Pt is a 89 y.o. male seen today for an acute visit due to increased agitation.   02/12 admitted to memory care. He had been  having frequent falls and progressive weakness prior to admission.09/2022 left sided subdural hematoma due to mechanical fall. 02/16 Zyprexa increased. He is also on Ativan 0.5 mg po BID prn for agitation. Nursing staff reports increased agitation today. Behaviors include: verbal abuse to staff, punching/swinging at staff and non compliance at times. Behaviors increased with ADLs and at night. Dr. Casimiro Needle consult recommended but refused by family per staff.   Followed by neurology for AD. CT head noted pronounced brain atrophy with chronic small vessel ischemia in deep cerebral white matter. Recent MMSE 22/30.   Past Medical History:  Diagnosis Date   CAD (coronary artery disease)    a. s/p inferior STEMI 01/08/2014; LHC 01/08/14: total RCA occlusion s/p 3.5x34m Xience DES distal RCA and 3.5x28 mm DES mid RCA, 60-70% mid LAD stenosis, EF 55%.   Complication of anesthesia    'long to wake up after back surgery " 09/2003   Diverticulosis of colon    GERD (gastroesophageal reflux disease)    History of cellulitis    05-26-2015  LLE   History of colon polyps    1998- benign/  2008 adenomatous    History of kidney stones    2013   History of squamous cell carcinoma excision    2013;  2015;  06-12-2015 right leg/  02/ and 05/ 2017  left ear and left leg   Hydronephrosis, left    Hyperlipidemia    Hypertension    Migraine with aura    OSA on CPAP 06/19/2015   Moderate OSA with AHI 18/hr  per study 05-20-2015   Osteoarthritis    Paroxysmal atrial fibrillation ([place] 4/16   chads2vasc score is at least  4   Premature atrial contractions    RA (rheumatoid arthritis) [facility])    rheumatologist-  dr Leigh Aurora   Sinusitis, chronic 10/17/2015   Wears glasses    Wears hearing aid    bilateral   Past Surgical History:  Procedure Laterality Date   BACK SURGERY     CARDIOVASCULAR STRESS TEST  11/21/2015   Low risk nuclear study w/ a small diaphragmatic attenuation artifact, no ischemia/  normal LV  function and wall motion , ef 63%   CATARACT EXTRACTION W/ INTRAOCULAR LENS  IMPLANT, BILATERAL  2015   COLONOSCOPY  last one 06-09-2011   CORONARY ANGIOPLASTY     CYSTOSCOPY W/ RETROGRADES Left 07/12/2016   Procedure: CYSTOSCOPY WITH RETROGRADE PYELOGRAM LEFT URETERAL STENT;  Surgeon: Irine Seal, MD;  Location: [facility];  Service: Urology;  Laterality: Left;   CYSTOSCOPY WITH RETROGRADE PYELOGRAM, URETEROSCOPY AND STENT PLACEMENT Left 08/11/2016   Procedure: CYSTOSCOPY WITH RETROGRADE PYELOGRAM,  DIAGNOSTIC URETEROSCOPY , STENT EXCHANGE;  Surgeon: Alexis Frock, MD;  Location: [facility];  Service: Urology;  Laterality: Left;   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   severe fibromatosis palm and fingers   LEFT HEART CATHETERIZATION WITH CORONARY ANGIOGRAM N/A 01/08/2014   Procedure: LEFT HEART CATHETERIZATION WITH CORONARY ANGIOGRAM;  Surgeon: Troy Sine, MD;  Location[facility] CATH LAB;  Service: Cardiovascular;  Laterality:N/A;  total/ subtotal RCA/  mLAD [id number] w/ mid systolic bridging/  preserved global LVF, ef 55%   MOHS SURGERY  x2  feb and may 2017   left ear /  left leg  (SCC)   PERCUTANEOUS CORONARY STENT INTERVENTION (PCI-S)  01/08/2014   Procedure: PERCUTANEOUS CORONARY STENT INTERVENTION (PCI-S);  Surgeon: Troy Sine, MD;  Location: [facility] CATH LAB;  Service: Cardiovascular;;  DES to mid and distal RCA   POSTERIOR LUMBAR FUSION  10/01/2003   and Laminectomy/ diskectomy  L4 -- S1   ROBOT ASSISTED PYELOPLASTY Left 09/15/2016   Procedure: XI ROBOTIC ASSISTED PYELOPLASTY;  Surgeon: Alexis Frock, MD;  Location: [facility];  Service: Urology;  Laterality: Left;   TONSILLECTOMY     TRANSTHORACIC ECHOCARDIOGRAM  02/23/2016   mild LVH,  ef 50-55%,  grade 1 diastolic dysfunction/  mild to moderate AV calcification w/ no stenosis or regurg./  trivial MR and TR/  mild PR    Allergies  Allergen Reactions   Methotrexate Other (See Comments)    REACTION: tachycardia    Aricept [Donepezil Hcl]     nightmares    Crestor [Rosuvastatin Calcium]     Myalgias with '20mg'$  dose   Lisinopril Other (See Comments)    cough   Namenda [Memantine]     n/d   Atorvastatin Other (See Comments)    Muscle pain, leg cramps   Pravastatin Sodium Other (See Comments)    REACTION: cramps, fatigue    Allergies as of 01/11/2023       Reactions   Methotrexate Other (See Comments)   REACTION: tachycardia   Aricept [donepezil Hcl]    nightmares    Crestor [rosuvastatin Calcium]    Myalgias with '20mg'$  dose   Lisinopril Other (See Comments)   cough   Namenda [memantine]    n/d   Atorvastatin Other (See Comments)   Muscle pain, leg cramps   Pravastatin Sodium Other (See Comments)   REACTION: cramps, fatigue        Medication List        Accurate as of January 11, 2023 10:14 AM. If you have  any questions, ask your nurse or doctor.          STOP taking these medications    docusate sodium 100 MG capsule Commonly known as: COLACE Stopped by: Yvonna Alanis, NP   magnesium hydroxide 400 MG/5ML suspension Commonly known as: MILK OF MAGNESIA Stopped by: Yvonna Alanis, NP   Orencia 250 MG injection Generic drug: abatacept Stopped by: Yvonna Alanis, NP   polyethylene glycol 17 g packet Commonly known as: MIRALAX / GLYCOLAX Stopped by: Yvonna Alanis, NP   rosuvastatin 5 MG tablet Commonly known as: Crestor Stopped by: Yvonna Alanis, NP       TAKE these medications    acetaminophen 500 MG tablet Commonly known as: TYLENOL Take 1,000 mg by mouth 2 (two) times daily. Scheduled and 2 by mouth as needed   apixaban 5 MG Tabs tablet Commonly known as: Eliquis Take 1 tablet (5 mg total) by mouth 2 (two) times daily.   divalproex 125 MG capsule Commonly known as: DEPAKOTE SPRINKLE Take 125 mg by mouth 2 (two) times daily.   donepezil 5 MG tablet Commonly known as: ARICEPT TAKE ONE TABLET BY MOUTH EVERY MORNING   escitalopram 20 MG tablet Commonly known as:  LEXAPRO Take 20 mg by mouth daily.   ezetimibe 10 MG tablet Commonly known as: ZETIA Take 5 mg by mouth daily.   gabapentin 300 MG capsule Commonly known as: NEURONTIN Take 1 capsule (300 mg total) by mouth 3 (three) times daily.   lidocaine 4 % Place 1 patch onto the skin 2 (two) times daily. Left side x 30 days   LORazepam 0.5 MG tablet Commonly known as: ATIVAN Take 0.5 mg by mouth 2 (two) times daily as needed for anxiety.   melatonin 5 MG Tabs Take 5 mg by mouth at bedtime.   memantine 10 MG tablet Commonly known as: NAMENDA Take 10 mg by mouth at bedtime.   metoprolol tartrate 25 MG tablet Commonly known as: LOPRESSOR Take 25 mg by mouth at bedtime. What changed: Another medication with the same name was removed. Continue taking this medication, and follow the directions you see here. Changed by: Yvonna Alanis, NP   nitroGLYCERIN 0.4 MG SL tablet Commonly known as: NITROSTAT Place 0.4 mg under the tongue every 5 (five) minutes as needed for chest pain.   OLANZapine 5 MG tablet Commonly known as: ZYPREXA Take 5 mg by mouth at bedtime. What changed: Another medication with the same name was removed. Continue taking this medication, and follow the directions you see here. Changed by: Yvonna Alanis, NP   omeprazole 40 MG capsule Commonly known as: PRILOSEC Take 40 mg by mouth daily. Take on empty stomach 30 minutes before breakfast.   Repatha SureClick [id number] MG/ML Soaj Generic drug: Evolocumab INJECT 1 PEN INTO SKIN EVERY 14 DAYS        Review of Systems  Unable to perform ROS: Dementia    Immunization History  Administered Date(s) Administered   Fluad Quad(high Dose 65+) 07/09/2019, 08/15/2020, 09/16/2021, 07/29/2022   Influenza Split 08/10/2011, 08/15/2012   Influenza Whole 08/21/2009, 09/22/2010   Influenza, High Dose Seasonal PF 09/16/2015, 08/18/2016, 08/31/2017, 08/14/2018   Influenza,inj,Quad PF,6+ Mos 08/06/2013   Influenza-Unspecified 08/31/2017    PFIZER(Purple Top)SARS-COV-2 Vaccination 12/11/2019, 01/01/2020, 08/12/2020   Pneumococcal Conjugate-13 10/02/2013   Pneumococcal Polysaccharide-23 06/04/2010   Tdap 10/22/2011, 09/22/2022   Zoster Recombinat (Shingrix) 03/09/2017, 05/10/2017   Pertinent  Health Maintenance Due  Topic Date Due  INFLUENZA VACCINE  Completed      10/05/2022    3:40 PM 10/12/2022    9:31 AM 11/01/2022    2:52 PM 11/02/2022    9:04 AM 11/30/2022    9:57 AM  Fall Risk  Falls in the past year? '1 1 1 1 1  '$ Was there an injury with Fall? '1 1 1 1 1  '$ Fall Risk Category Calculator '3 3 3 3 2  '$ Fall Risk Category (Retired) [employer]   (RETIRED) Patient Fall Risk Level High fall risk High fall risk High fall risk High fall risk   Patient at Risk for Falls Due to History of fall(s);Impaired balance/gait History of fall(s);Impaired balance/gait;Impaired mobility  History of fall(s);Impaired balance/gait;Impaired mobility No Fall Risks  Fall risk Follow up Falls evaluation completed Falls evaluation completed Falls evaluation completed Falls evaluation completed Falls evaluation completed   Functional Status Survey:    Vitals:   01/11/23 1003  BP: (!) 174/92  Pulse: 84  Resp: 16  Temp: (!) 97.2 F (36.2 C)  SpO2: 96%  Height: '5\' 10"'$  (1.778 m)   Body mass index is 26.77 kg/m. Physical Exam Vitals reviewed.  Constitutional:      General: He is not in acute distress. HENT:     Head: Normocephalic.  Eyes:     General:        Right eye: No discharge.        Left eye: No discharge.  Cardiovascular:     Rate and Rhythm: Normal rate and regular rhythm.     Pulses: Normal pulses.     Heart sounds: Normal heart sounds.  Pulmonary:     Effort: Pulmonary effort is normal. No respiratory distress.     Breath sounds: Normal breath sounds. No wheezing.  Abdominal:     General: Bowel sounds are normal. There is no distension.     Palpations: Abdomen is soft.     Tenderness: There is no abdominal  tenderness.  Musculoskeletal:     Cervical back: Neck supple.     Right lower leg: No edema.     Left lower leg: No edema.  Skin:    General: Skin is warm.     Capillary Refill: Capillary refill takes less than 2 seconds.  Neurological:     General: No focal deficit present.     Mental Status: He is alert and oriented to person, place, and time.     Motor: No weakness.     Gait: Gait normal.  Psychiatric:        Mood and Affect: Mood normal.        Behavior: Behavior normal.     Labs reviewed: Recent Labs    10/04/22 0552 10/05/22 0243 10/11/22 0600 12/26/22 1414  NA 142 139 143 136  K 3.8 3.8 4.1 3.6  CL 106 107 107 102  CO2 26 23 25* 22  GLUCOSE 97 109*  --  105*  BUN '12 14 16 11  '$ CREATININE 0.95 0.93 0.9 0.90  CALCIUM 8.8* 8.7* 8.8 9.0   Recent Labs    07/29/22 1345 10/02/22 0350 10/11/22 0600 12/26/22 1414  AST '17 24 21 30  '$ ALT '13 16 13 12  '$ ALKPHOS 58 61 89 85  BILITOT 0.5 0.6  --  1.0  PROT 7.7 7.0  --  7.8  ALBUMIN 4.1 4.0 3.7 3.8   Recent Labs    07/29/22 1345 09/24/22 0000 10/04/22 0552 10/05/22 0243 10/11/22 0600 12/26/22 1414  WBC  5.3   < > 6.1 6.4 5.4 8.1  NEUTROABS 2.7  --   --   --   --  6.2  HGB 12.8*   < > 11.3* 10.8* 10.8* 12.7*  HCT 38.2*   < > 33.7* 32.2* 32* 38.3*  MCV 99.3   < > 98.5 98.8  --  97.0  PLT 274.0   < > 296 277  --  272   < > = values in this interval not displayed.   Lab Results  Component Value Date   TSH 1.18 12/13/2017   Lab Results  Component Value Date   HGBA1C 6.4 08/30/2017   Lab Results  Component Value Date   CHOL 197 08/04/2020   HDL 47 08/04/2020   LDLCALC 106 (H) 08/04/2020   LDLDIRECT 168.9 05/24/2013   TRIG 254 (H) 08/04/2020   CHOLHDL 4.2 08/04/2020    Significant Diagnostic Results in last 30 days:  DG Chest Port 1 View  Result Date: 12/26/2022 CLINICAL DATA:  Sepsis EXAM: PORTABLE CHEST 1 VIEW COMPARISON:  10/03/2022 FINDINGS: Normal heart size. Aortic atherosclerosis. Minimal  bibasilar interstitial opacities. No pleural effusion or pneumothorax. Redemonstrated left-sided rib fractures. IMPRESSION: Minimal bibasilar interstitial opacities, which may represent atelectasis versus developing atypical/viral infection. Electronically Signed   By: Davina Poke D.O.   On: 12/26/2022 14:15    Assessment/Plan 1. Agitation due to dementia [facility]) - 02/12 admission to memory care unit, MMSE 22/30 - followed by neurology - 02/16 Zyprexa increased to 5 mg qhs - verbal abuse, punching/swinging, non compliance towards staff> all times of day - start Depakote 125 mg po BID - start melatonin 5 mg po qhs - consider Rexulti in future - recommend Dr. Casimiro Needle consult if behaviors persist - cmp in 2 weeks   Family/ staff Communication: plan discussed with patient and nurse  Labs/tests ordered:  cmp in 2 weeks

## 2023-01-25 ENCOUNTER — Encounter: Payer: Self-pay | Admitting: Orthopedic Surgery

## 2023-01-25 ENCOUNTER — Non-Acute Institutional Stay (SKILLED_NURSING_FACILITY): Payer: Medicare Other | Admitting: Orthopedic Surgery

## 2023-01-25 DIAGNOSIS — E782 Mixed hyperlipidemia: Secondary | ICD-10-CM

## 2023-01-25 DIAGNOSIS — Z79899 Other long term (current) drug therapy: Secondary | ICD-10-CM | POA: Diagnosis not present

## 2023-01-25 DIAGNOSIS — I480 Paroxysmal atrial fibrillation: Secondary | ICD-10-CM

## 2023-01-25 DIAGNOSIS — I2510 Atherosclerotic heart disease of native coronary artery without angina pectoris: Secondary | ICD-10-CM

## 2023-01-25 DIAGNOSIS — K219 Gastro-esophageal reflux disease without esophagitis: Secondary | ICD-10-CM

## 2023-01-25 DIAGNOSIS — G4733 Obstructive sleep apnea (adult) (pediatric): Secondary | ICD-10-CM

## 2023-01-25 DIAGNOSIS — I10 Essential (primary) hypertension: Secondary | ICD-10-CM

## 2023-01-25 DIAGNOSIS — F03911 Unspecified dementia, unspecified severity, with agitation: Secondary | ICD-10-CM | POA: Diagnosis not present

## 2023-01-25 DIAGNOSIS — M069 Rheumatoid arthritis, unspecified: Secondary | ICD-10-CM

## 2023-01-25 NOTE — Progress Notes (Signed)
Location:   [facility] Room Number: 306-A Place of Service:  SNF [phone]) Provider:  Windell Moulding, NP  PCP: Evan Dad, MD  Patient Care Team: Evan Dad, MD as PCP - General (Internal Medicine) Sueanne Margarita, MD as PCP - Cardiology (Cardiology) Thompson Grayer, MD (Inactive) as PCP - Electrophysiology (Cardiology) Irene Shipper, MD (Gastroenterology) Thompson Grayer, MD (Inactive) (Cardiology) Hennie Duos, MD as Consulting Physician (Rheumatology) Alexis Frock, MD as Consulting Physician (Urology) Katy Apo, MD as Consulting Physician (Ophthalmology) Mathis Fare (Dentistry) Tiajuana Amass, MD as Referring Physician (Allergy and Immunology) Cameron Sprang, MD as Consulting Physician (Neurology) Charlton Haws, [facility] as Pharmacist (Pharmacist) Cameron Sprang, MD as Consulting Physician (Neurology)  Extended Emergency Contact Information Primary Emergency Contact: [name] Address: [place], [place] Phone: [phone] Work Phone: [phone] Mobile Phone: [phone] Relation: Spouse Secondary Emergency Contact: [place] Mobile Phone: [phone] Relation: Daughter  Code Status: DNR  Goals of care: Advanced Directive information    01/25/2023    9:58 AM  Advanced Directives  Does Patient Have a Medical Advance Directive? Yes  Type of Paramedic of [place];Out of facility DNR (pink MOST or yellow form)  Does patient want to make changes to medical advance directive? No - Patient declined  Copy of [place] in Chart? Yes - validated most recent copy scanned in chart (See row information)     Chief Complaint  Patient presents with   Medical Management of Chronic Issues    Routine Visit.     HPI:  Pt is a 89 y.o. male seen today for medical management of chronic diseases.    He currently resides on  the memory care unit due to Alzheimer's dementia. PMH: CAD s/p  STEMI 2015, HTN, HLD, PAF, GERD, frequent falls s/p subdural hematoma 09/2022, left hydronephrosis, RA, migraine, OSA, OA, depression and insomnia.   PAF- TSH 1.18 11/2017, HR controlled with metoprolol, remains on Eliquis for clot prevention Alzheimer's dementia with agitation- 02/12 admitted to memory care, MMSE 22/30, 02/27 noted to have increased agitation and physical with staff> Depakote started, today nursing reports improved behaviors, remains on Zyprexa, Aricept, Namenda and ativan prn HTN- BUN/creat 11/0.90 12/26/2022, remains on metoprolol CAD- s/p STEMI 2015, no recent lipid panel, remains on statin, Repatha injections and Zetia HLD- see above, total 197/ LDL 106 07/2020 OSA- remains on CPAP GERD- hgb 12.7 09/2022, remains on Prilosec RA - followed by rheumatology, remains on tylenol   Recent blood pressures:  03/11- 144/72  03/08- 135/78  03/07- 121/70  Recent weights:  03/01- 188 lbs  11/28- 186.6 lbs  11/22- 187.8 lbs     Past Medical History:  Diagnosis Date   CAD (coronary artery disease)    a. s/p inferior STEMI 01/08/2014; LHC 01/08/14: total RCA occlusion s/p 3.5x46m Xience DES distal RCA and 3.5x28 mm DES mid RCA, 60-70% mid LAD stenosis, EF 55%.   Complication of anesthesia    'long to wake up after back surgery " 09/2003   Diverticulosis of colon    GERD (gastroesophageal reflux disease)    History of cellulitis    05-26-2015  LLE   History of colon polyps    1998- benign/  2008 adenomatous    History of kidney stones    2013   History of squamous cell carcinoma excision    2013;  2015;  06-12-2015 right leg/  02/ and 05/ 2017  left ear and left leg   Hydronephrosis, left    Hyperlipidemia    Hypertension    Migraine with aura    OSA on CPAP 06/19/2015   Moderate OSA with AHI 18/hr  per study 05-20-2015   Osteoarthritis    Paroxysmal atrial fibrillation ([place]) 4/16   chads2vasc score is  at least 4   Premature atrial contractions    RA (rheumatoid arthritis) [facility])    rheumatologist-  dr Leigh Aurora   Sinusitis, chronic 10/17/2015   Wears glasses    Wears hearing aid    bilateral   Past Surgical History:  Procedure Laterality Date   BACK SURGERY     CARDIOVASCULAR STRESS TEST  11/21/2015   Low risk nuclear study w/ a small diaphragmatic attenuation artifact, no ischemia/  normal LV function and wall motion , ef 63%   CATARACT EXTRACTION W/ INTRAOCULAR LENS  IMPLANT, BILATERAL  2015   COLONOSCOPY  last one 06-09-2011   CORONARY ANGIOPLASTY     CYSTOSCOPY W/ RETROGRADES Left 07/12/2016   Procedure: CYSTOSCOPY WITH RETROGRADE PYELOGRAM LEFT URETERAL STENT;  Surgeon: Irine Seal, MD;  Location: [facility];  Service: Urology;  Laterality: Left;   CYSTOSCOPY WITH RETROGRADE PYELOGRAM, URETEROSCOPY AND STENT PLACEMENT Left 08/11/2016   Procedure: CYSTOSCOPY WITH RETROGRADE PYELOGRAM,  DIAGNOSTIC URETEROSCOPY , STENT EXCHANGE;  Surgeon: Alexis Frock, MD;  Location: [facility];  Service: Urology;  Laterality: Left;   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   severe fibromatosis palm and fingers   LEFT HEART CATHETERIZATION WITH CORONARY ANGIOGRAM N/A 01/08/2014   Procedure: LEFT HEART CATHETERIZATION WITH CORONARY ANGIOGRAM;  Surgeon: Troy Sine, MD;  Location: [facility] CATH LAB;  Service: Cardiovascular;  Laterality:N/A;  total/ subtotal RCA/  mLAD [id number] w/ mid systolic bridging/  preserved global LVF, ef 55%   MOHS SURGERY  x2  feb and may 2017   left ear /  left leg  ([facility])   PERCUTANEOUS CORONARY STENT INTERVENTION (PCI-S)  01/08/2014   Procedure: PERCUTANEOUS CORONARY STENT INTERVENTION (PCI-S);  Surgeon: Troy Sine, MD;  Location: [facility] CATH LAB;  Service: Cardiovascular;;  DES to mid and distal RCA   POSTERIOR LUMBAR FUSION  10/01/2003   and Laminectomy/ diskectomy  L4 -- S1   ROBOT ASSISTED PYELOPLASTY Left 09/15/2016   Procedure: XI ROBOTIC ASSISTED  PYELOPLASTY;  Surgeon: Alexis Frock, MD;  Location: [facility];  Service: Urology;  Laterality: Left;   TONSILLECTOMY     TRANSTHORACIC ECHOCARDIOGRAM  02/23/2016   mild LVH,  ef 50-55%,  grade 1 diastolic dysfunction/  mild to moderate AV calcification w/ no stenosis or regurg./  trivial MR and TR/  mild PR    Allergies  Allergen Reactions   Methotrexate Other (See Comments)    REACTION: tachycardia   Aricept [Donepezil Hcl]     nightmares    Crestor [Rosuvastatin Calcium]     Myalgias with '20mg'$  dose   Lisinopril Other (See Comments)    cough   Namenda [Memantine]     n/d   Atorvastatin Other (See Comments)    Muscle pain, leg cramps   Pravastatin Sodium Other (See Comments)    REACTION: cramps, fatigue    Allergies as of 01/25/2023       Reactions   Methotrexate Other (See Comments)   REACTION: tachycardia   Aricept [donepezil Hcl]    nightmares    Crestor [rosuvastatin Calcium]    Myalgias with  $'20mg'T$  dose   Lisinopril Other (See Comments)   cough   Namenda [memantine]    n/d   Atorvastatin Other (See Comments)   Muscle pain, leg cramps   Pravastatin Sodium Other (See Comments)   REACTION: cramps, fatigue        Medication List        Accurate as of January 25, 2023  9:58 AM. If you have any questions, ask your nurse or doctor.          STOP taking these medications    escitalopram 20 MG tablet Commonly known as: LEXAPRO Stopped by: Yvonna Alanis, NP   lidocaine 4 % Stopped by: Yvonna Alanis, NP       TAKE these medications    acetaminophen 500 MG tablet Commonly known as: TYLENOL Take 1,000 mg by mouth 2 (two) times daily. Scheduled and 2 by mouth as needed   apixaban 5 MG Tabs tablet Commonly known as: Eliquis Take 1 tablet (5 mg total) by mouth 2 (two) times daily.   divalproex 125 MG capsule Commonly known as: DEPAKOTE SPRINKLE Take 125 mg by mouth 2 (two) times daily.   donepezil 5 MG tablet Commonly known as: ARICEPT TAKE ONE TABLET BY  MOUTH EVERY MORNING   ezetimibe 10 MG tablet Commonly known as: ZETIA Take 5 mg by mouth daily.   gabapentin 300 MG capsule Commonly known as: NEURONTIN Take 1 capsule (300 mg total) by mouth 3 (three) times daily.   LORazepam 0.5 MG tablet Commonly known as: ATIVAN Take 0.5 mg by mouth 2 (two) times daily as needed for anxiety.   LORazepam 0.5 MG tablet Commonly known as: ATIVAN Take 0.5 mg by mouth as needed for anxiety.   melatonin 5 MG Tabs Take 5 mg by mouth at bedtime.   memantine 10 MG tablet Commonly known as: NAMENDA Take 10 mg by mouth at bedtime.   metoprolol tartrate 25 MG tablet Commonly known as: LOPRESSOR Take 25 mg by mouth at bedtime.   nitroGLYCERIN 0.4 MG SL tablet Commonly known as: NITROSTAT Place 0.4 mg under the tongue every 5 (five) minutes as needed for chest pain.   OLANZapine 5 MG tablet Commonly known as: ZYPREXA Take 5 mg by mouth at bedtime.   omeprazole 40 MG capsule Commonly known as: PRILOSEC Take 40 mg by mouth daily. Take on empty stomach 30 minutes before breakfast.   Repatha SureClick [id number] MG/ML [place] Generic drug: Evolocumab INJECT 1 PEN INTO SKIN EVERY 14 DAYS   rosuvastatin 5 MG tablet Commonly known as: CRESTOR Take 5 mg by mouth every morning.        Review of Systems  Unable to perform ROS: Dementia    Immunization History  Administered Date(s) Administered   Fluad Quad(high Dose 65+) 07/09/2019, 08/15/2020, 09/16/2021, 07/29/2022   Influenza Split 08/10/2011, 08/15/2012   Influenza Whole 08/21/2009, 09/22/2010   Influenza, High Dose Seasonal PF 09/16/2015, 08/18/2016, 08/31/2017, 08/14/2018   Influenza,inj,Quad PF,6+ Mos 08/06/2013   Influenza-Unspecified 08/31/2017   PFIZER(Purple Top)SARS-COV-2 Vaccination 12/11/2019, 01/01/2020, 08/12/2020   Pneumococcal Conjugate-13 10/02/2013   Pneumococcal Polysaccharide-23 06/04/2010   Tdap 10/22/2011, 09/22/2022   Zoster Recombinat (Shingrix) 03/09/2017, 05/10/2017    Pertinent  Health Maintenance Due  Topic Date Due   INFLUENZA VACCINE  Completed      10/05/2022    3:40 PM 10/12/2022    9:31 AM 11/01/2022    2:52 PM 11/02/2022    9:04 AM 11/30/2022    9:57 AM  Fall Risk  Falls in the past year? '1 1 1 1 1  '$ Was there an injury with Fall? '1 1 1 1 1  '$ Fall Risk Category Calculator '3 3 3 3 2  '$ Fall Risk Category (Retired) [employer]   (RETIRED) Patient Fall Risk Level High fall risk High fall risk High fall risk High fall risk   Patient at Risk for Falls Due to History of fall(s);Impaired balance/gait History of fall(s);Impaired balance/gait;Impaired mobility  History of fall(s);Impaired balance/gait;Impaired mobility No Fall Risks  Fall risk Follow up Falls evaluation completed Falls evaluation completed Falls evaluation completed Falls evaluation completed Falls evaluation completed   Functional Status Survey:    Vitals:   01/25/23 0948  BP: (!) 144/72  Pulse: 68  Resp: 18  Temp: (!) 97.3 F (36.3 C)  SpO2: 94%  Weight: 188 lb (85.3 kg)  Height: '5\' 10"'$  (1.778 m)   Body mass index is 26.98 kg/m. Physical Exam Vitals reviewed.  Constitutional:      General: He is not in acute distress. HENT:     Head: Normocephalic.     Right Ear: There is no impacted cerumen.     Left Ear: There is no impacted cerumen.     Nose: Nose normal.     Mouth/Throat:     Mouth: Mucous membranes are moist.  Eyes:     General:        Right eye: No discharge.        Left eye: No discharge.  Cardiovascular:     Rate and Rhythm: Normal rate. Rhythm irregular.     Pulses: Normal pulses.     Heart sounds: Normal heart sounds.  Pulmonary:     Effort: Pulmonary effort is normal. No respiratory distress.     Breath sounds: Normal breath sounds. No wheezing.  Abdominal:     General: Bowel sounds are normal. There is no distension.     Palpations: Abdomen is soft.     Tenderness: There is no abdominal tenderness.  Musculoskeletal:     Cervical  back: Neck supple.     Right lower leg: No edema.     Left lower leg: No edema.  Skin:    General: Skin is warm and dry.     Capillary Refill: Capillary refill takes less than 2 seconds.  Neurological:     General: No focal deficit present.     Mental Status: He is alert. Mental status is at baseline.     Motor: No weakness.     Gait: Gait normal.  Psychiatric:        Mood and Affect: Mood normal.        Behavior: Behavior normal.     Labs reviewed: Recent Labs    10/04/22 0552 10/05/22 0243 10/11/22 0600 12/26/22 1414  NA 142 139 143 136  K 3.8 3.8 4.1 3.6  CL 106 107 107 102  CO2 26 23 25* 22  GLUCOSE 97 109*  --  105*  BUN '12 14 16 11  '$ CREATININE 0.95 0.93 0.9 0.90  CALCIUM 8.8* 8.7* 8.8 9.0   Recent Labs    07/29/22 1345 10/02/22 0350 10/11/22 0600 12/26/22 1414  AST '17 24 21 30  '$ ALT '13 16 13 12  '$ ALKPHOS 58 61 89 85  BILITOT 0.5 0.6  --  1.0  PROT 7.7 7.0  --  7.8  ALBUMIN 4.1 4.0 3.7 3.8   Recent Labs    07/29/22 1345 09/24/22 0000 10/04/22 0552 10/05/22 0243 10/11/22  0600 12/26/22 1414  WBC 5.3   < > 6.1 6.4 5.4 8.1  NEUTROABS 2.7  --   --   --   --  6.2  HGB 12.8*   < > 11.3* 10.8* 10.8* 12.7*  HCT 38.2*   < > 33.7* 32.2* 32* 38.3*  MCV 99.3   < > 98.5 98.8  --  97.0  PLT 274.0   < > 296 277  --  272   < > = values in this interval not displayed.   Lab Results  Component Value Date   TSH 1.18 12/13/2017   Lab Results  Component Value Date   HGBA1C 6.4 08/30/2017   Lab Results  Component Value Date   CHOL 197 08/04/2020   HDL 47 08/04/2020   LDLCALC 106 (H) 08/04/2020   LDLDIRECT 168.9 05/24/2013   TRIG 254 (H) 08/04/2020   CHOLHDL 4.2 08/04/2020    Significant Diagnostic Results in last 30 days:  DG Chest Port 1 View  Result Date: 12/26/2022 CLINICAL DATA:  Sepsis EXAM: PORTABLE CHEST 1 VIEW COMPARISON:  10/03/2022 FINDINGS: Normal heart size. Aortic atherosclerosis. Minimal bibasilar interstitial opacities. No pleural effusion  or pneumothorax. Redemonstrated left-sided rib fractures. IMPRESSION: Minimal bibasilar interstitial opacities, which may represent atelectasis versus developing atypical/viral infection. Electronically Signed   By: Davina Poke D.O.   On: 12/26/2022 14:15    Assessment/Plan 1. Paroxysmal atrial fibrillation (HCC) - HR < 100> controlled with metoprolol - cont Eliquid for clot prevention - TSH- future  2. Agitation due to dementia [facility]) - improved, not physical with staff per nursing  - ambulates on own - cont memory care - 02/27 Depakote started  - cont Zyprexa, Aricept, Namenda and ativan prn  3. Primary hypertension - controlled with metoprolol  4. Coronary artery disease involving native coronary artery of native heart without angina pectoris - followed by cardiology - cont statin, Repatha injections and Zetia - lipid panel- future  5. Mixed hyperlipidemia - see above  6. OSA (obstructive sleep apnea) - cont CPAP QHS  7. Gastroesophageal reflux disease without esophagitis - hgb stable - cont omeprazole  8. Rheumatoid arthritis involving elbow, unspecified laterality, unspecified whether rheumatoid factor present ([place]) - followed by rheumatology - cont tylenol       Family/ staff Communication: plan discussed with patient and nurse  Labs/tests ordered:  TSH, lipid panel 01/27/2023

## 2023-01-26 DIAGNOSIS — E785 Hyperlipidemia, unspecified: Secondary | ICD-10-CM | POA: Diagnosis not present

## 2023-01-26 LAB — TSH: TSH: 0.79 (ref 0.41–5.90)

## 2023-01-26 LAB — COMPREHENSIVE METABOLIC PANEL
Albumin: 3.8 (ref 3.5–5.0)
Calcium: 9 (ref 8.7–10.7)
Globulin: 2.7
eGFR: 82

## 2023-01-26 LAB — BASIC METABOLIC PANEL
BUN: 13 (ref 4–21)
CO2: 19 (ref 13–22)
Chloride: 105 (ref 99–108)
Creatinine: 0.9 (ref 0.6–1.3)
Glucose: 102
Potassium: 4.2 mEq/L (ref 3.5–5.1)
Sodium: 138 (ref 137–147)

## 2023-01-26 LAB — LIPID PANEL
Cholesterol: 126 (ref 0–200)
HDL: 36 (ref 35–70)
LDL Cholesterol: 39
LDl/HDL Ratio: 3.5
Triglycerides: 254 — AB (ref 40–160)

## 2023-01-26 LAB — HEPATIC FUNCTION PANEL
ALT: 12 U/L (ref 10–40)
AST: 19 (ref 14–40)
Alkaline Phosphatase: 79 (ref 25–125)
Bilirubin, Total: 0.2

## 2023-02-02 ENCOUNTER — Other Ambulatory Visit: Payer: Self-pay | Admitting: Orthopedic Surgery

## 2023-02-02 DIAGNOSIS — E663 Overweight: Secondary | ICD-10-CM | POA: Diagnosis not present

## 2023-02-02 DIAGNOSIS — F03911 Unspecified dementia, unspecified severity, with agitation: Secondary | ICD-10-CM

## 2023-02-02 DIAGNOSIS — Z6827 Body mass index (BMI) 27.0-27.9, adult: Secondary | ICD-10-CM | POA: Diagnosis not present

## 2023-02-02 DIAGNOSIS — M25562 Pain in left knee: Secondary | ICD-10-CM | POA: Diagnosis not present

## 2023-02-02 DIAGNOSIS — M5030 Other cervical disc degeneration, unspecified cervical region: Secondary | ICD-10-CM | POA: Diagnosis not present

## 2023-02-02 DIAGNOSIS — M0609 Rheumatoid arthritis without rheumatoid factor, multiple sites: Secondary | ICD-10-CM | POA: Diagnosis not present

## 2023-02-02 MED ORDER — LORAZEPAM 0.5 MG PO TABS: 0.5000 mg | ORAL_TABLET | Freq: Two times a day (BID) | ORAL | 0 refills | Status: DC | PRN

## 2023-02-07 ENCOUNTER — Encounter: Payer: Self-pay | Admitting: Internal Medicine

## 2023-02-07 ENCOUNTER — Non-Acute Institutional Stay (SKILLED_NURSING_FACILITY): Payer: Medicare Other | Admitting: Internal Medicine

## 2023-02-07 DIAGNOSIS — M069 Rheumatoid arthritis, unspecified: Secondary | ICD-10-CM | POA: Diagnosis not present

## 2023-02-07 DIAGNOSIS — E782 Mixed hyperlipidemia: Secondary | ICD-10-CM | POA: Diagnosis not present

## 2023-02-07 DIAGNOSIS — G308 Other Alzheimer's disease: Secondary | ICD-10-CM | POA: Diagnosis not present

## 2023-02-07 DIAGNOSIS — I10 Essential (primary) hypertension: Secondary | ICD-10-CM

## 2023-02-07 DIAGNOSIS — K219 Gastro-esophageal reflux disease without esophagitis: Secondary | ICD-10-CM

## 2023-02-07 DIAGNOSIS — F02B3 Dementia in other diseases classified elsewhere, moderate, with mood disturbance: Secondary | ICD-10-CM | POA: Diagnosis not present

## 2023-02-07 DIAGNOSIS — F03911 Unspecified dementia, unspecified severity, with agitation: Secondary | ICD-10-CM

## 2023-02-07 DIAGNOSIS — I480 Paroxysmal atrial fibrillation: Secondary | ICD-10-CM | POA: Diagnosis not present
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
## 2023-02-17 DIAGNOSIS — F4323 Adjustment disorder with mixed anxiety and depressed mood: Secondary | ICD-10-CM | POA: Diagnosis not present

## 2023-02-25 ENCOUNTER — Non-Acute Institutional Stay: Payer: Medicare Other | Admitting: Adult Health

## 2023-02-25 ENCOUNTER — Encounter: Payer: Self-pay | Admitting: Adult Health

## 2023-02-25 DIAGNOSIS — R21 Rash and other nonspecific skin eruption: Secondary | ICD-10-CM

## 2023-02-25 DIAGNOSIS — L853 Xerosis cutis: Secondary | ICD-10-CM

## 2023-02-25 MED ORDER — EUCERIN ORIGINAL HEALING EX CREA: TOPICAL_CREAM | Freq: Every day | CUTANEOUS | 0 refills | Status: DC

## 2023-02-25 MED ORDER — HYDROXYZINE PAMOATE 25 MG PO CAPS: 25.0000 mg | ORAL_CAPSULE | Freq: Three times a day (TID) | ORAL | 0 refills | Status: DC | PRN

## 2023-02-25 MED ORDER — PREDNISONE 20 MG PO TABS: 20.0000 mg | ORAL_TABLET | Freq: Two times a day (BID) | ORAL | 0 refills | Status: DC

## 2023-02-25 NOTE — Progress Notes (Signed)
Location:  [facility] Room Number: 306A Place of Service:  [phone]) Provider:  Fletcher Anon, NP   [name], MD  Patient Care Team: [name], MD as PCP - General (Internal Medicine) Quintella Reichert, MD as PCP - Cardiology (Cardiology) Hillis Range, MD (Inactive) as PCP - Electrophysiology (Cardiology) Hilarie Fredrickson, MD (Gastroenterology) Hillis Range, MD (Inactive) (Cardiology) Donnetta Hail, MD as Consulting Physician (Rheumatology) Sebastian Ache, MD as Consulting Physician (Urology) Antony Contras, MD as Consulting Physician (Ophthalmology) Dannielle Burn (Dentistry) Eileen Stanford, MD as Referring Physician (Allergy and Immunology) Van Clines, MD as Consulting Physician (Neurology) Kathyrn Sheriff, [facility] as Pharmacist (Pharmacist) Van Clines, MD as Consulting Physician (Neurology)  Extended Emergency Contact Information Primary Emergency Contact: [name] Address: [address]          [place], [place] Home Phone: [phone] Work Phone: [phone] Mobile Phone: [phone] Relation: Spouse Secondary Emergency Contact: [name] Mobile Phone: [phone] Relation: Daughter  Code Status:  DNR Goals of care: Advanced Directive information    02/25/2023   10:44 AM  Advanced Directives  Does Patient Have a Medical Advance Directive? Yes  Type of Estate agent of [place];Out of facility DNR (pink MOST or yellow form)  Does patient want to make changes to medical advance directive? No - Patient declined  Copy of Healthcare Power of Attorney in Chart? Yes - validated most recent copy scanned in chart (See row information)     Chief Complaint  Patient presents with   Acute Visit    Patient being seen for a Rash     HPI:  Pt is a 89 y.o. male seen today for an acute visit for rash  Nurse reports that [name] has a rash to his arms and  trunk and they have been using triamcinolone for 1 week with no improvement. He has had dry skin and associated rash before which responded to the cream. Depakote was  increased and seroquel added for behaviors in March . His gait is slower and less steady since starting the meds but the nurse has seen improvement in behaviors. Namenda was also increased in March. The rash is itchy. He is not having any sob, wheeze, or fever.  Has very dry skin    Past Medical History:  Diagnosis Date   CAD (coronary artery disease)    a. s/p inferior STEMI 01/08/2014; LHC 01/08/14: total RCA occlusion s/p 3.5x64mm Xience DES distal RCA and 3.5x28 mm DES mid RCA, 60-70% mid LAD stenosis, EF 55%.   Complication of anesthesia    'long to wake up after back surgery " 09/2003   Diverticulosis of colon    GERD (gastroesophageal reflux disease)    History of cellulitis    05-26-2015  LLE   History of colon polyps    1998- benign/  2008 adenomatous    History of kidney stones    2013   History of squamous cell carcinoma excision    2013;  2015;  06-12-2015 right leg/  02/ and 05/ 2017  left ear and left leg   Hydronephrosis, left    Hyperlipidemia    Hypertension    Migraine with aura    OSA on CPAP 06/19/2015   Moderate OSA with AHI 18/hr  per study 05-20-2015   Osteoarthritis    Paroxysmal atrial fibrillation 4/16   chads2vasc score is at least 4   Premature atrial contractions  RA (rheumatoid arthritis)    rheumatologist-  dr Alben Deeds   Sinusitis, chronic 10/17/2015   Wears glasses    Wears hearing aid    bilateral   Past Surgical History:  Procedure Laterality Date   BACK SURGERY     CARDIOVASCULAR STRESS TEST  11/21/2015   Low risk nuclear study w/ a small diaphragmatic attenuation artifact, no ischemia/  normal LV function and wall motion , ef 63%   CATARACT EXTRACTION W/ INTRAOCULAR LENS  IMPLANT, BILATERAL  2015   COLONOSCOPY  last one 06-09-2011   CORONARY ANGIOPLASTY     CYSTOSCOPY  W/ RETROGRADES Left 07/12/2016   Procedure: CYSTOSCOPY WITH RETROGRADE PYELOGRAM LEFT URETERAL STENT;  Surgeon: Bjorn Pippin, MD;  Location: [facility];  Service: Urology;  Laterality: Left;   CYSTOSCOPY WITH RETROGRADE PYELOGRAM, URETEROSCOPY AND STENT PLACEMENT Left 08/11/2016   Procedure: CYSTOSCOPY WITH RETROGRADE PYELOGRAM,  DIAGNOSTIC URETEROSCOPY , STENT EXCHANGE;  Surgeon: Sebastian Ache, MD;  Location: [facility];  Service: Urology;  Laterality: Left;   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   severe fibromatosis palm and fingers   LEFT HEART CATHETERIZATION WITH CORONARY ANGIOGRAM N/A 01/08/2014   Procedure: LEFT HEART CATHETERIZATION WITH CORONARY ANGIOGRAM;  Surgeon: Lennette Bihari, MD;  Location: [facility];  Service: Cardiovascular;  Laterality:N/A;  total/ subtotal RCA/  mLAD 60-70% w/ mid systolic bridging/  preserved global LVF, ef 55%   MOHS SURGERY  x2  feb and may 2017   left ear /  left leg  ([facility])   PERCUTANEOUS CORONARY STENT INTERVENTION (PCI-S)  01/08/2014   Procedure: PERCUTANEOUS CORONARY STENT INTERVENTION (PCI-S);  Surgeon: Lennette Bihari, MD;  Location: [facility] CATH LAB;  Service: Cardiovascular;;  DES to mid and distal RCA   POSTERIOR LUMBAR FUSION  10/01/2003   and Laminectomy/ diskectomy  L4 -- S1   ROBOT ASSISTED PYELOPLASTY Left 09/15/2016   Procedure: XI ROBOTIC ASSISTED PYELOPLASTY;  Surgeon: Sebastian Ache, MD;  Location: [facility];  Service: Urology;  Laterality: Left;   TONSILLECTOMY     TRANSTHORACIC ECHOCARDIOGRAM  02/23/2016   mild LVH,  ef 50-55%,  grade 1 diastolic dysfunction/  mild to moderate AV calcification w/ no stenosis or regurg./  trivial MR and TR/  mild PR    Allergies  Allergen Reactions   Methotrexate Other (See Comments)    REACTION: tachycardia   Aricept [Donepezil Hcl]     nightmares    Crestor [Rosuvastatin Calcium]     Myalgias with 20mg  dose   Lisinopril Other (See Comments)    cough   Namenda [Memantine]     n/d    Atorvastatin Other (See Comments)    Muscle pain, leg cramps   Pravastatin Sodium Other (See Comments)    REACTION: cramps, fatigue    Outpatient Encounter Medications as of 02/25/2023  Medication Sig   apixaban (ELIQUIS) 5 MG TABS tablet Take 1 tablet (5 mg total) by mouth 2 (two) times daily.   divalproex (DEPAKOTE SPRINKLE) 125 MG capsule Take 125 mg by mouth 3 (three) times daily.   donepezil (ARICEPT) 5 MG tablet TAKE ONE TABLET BY MOUTH EVERY MORNING   escitalopram (LEXAPRO) 20 MG tablet Take 20 mg by mouth daily.   Evolocumab (REPATHA SURECLICK) 140 MG/ML SOAJ INJECT 1 PEN INTO SKIN EVERY 14 DAYS   ezetimibe (ZETIA) 10 MG tablet Take 5 mg by mouth daily.   gabapentin (NEURONTIN) 300 MG capsule Take 1 capsule (300 mg total) by mouth 3 (three) times daily.  LORazepam (ATIVAN) 0.5 MG tablet Take 0.5 mg by mouth as needed for anxiety.   melatonin 5 MG TABS Take 5 mg by mouth at bedtime.   memantine (NAMENDA) 5 MG tablet Take 5 mg by mouth in the morning.   metoprolol tartrate (LOPRESSOR) 25 MG tablet Take 25 mg by mouth at bedtime.   nitroGLYCERIN (NITROSTAT) 0.4 MG SL tablet Place 0.4 mg under the tongue every 5 (five) minutes as needed for chest pain.   omeprazole (PRILOSEC) 40 MG capsule Take 40 mg by mouth daily. Take on empty stomach 30 minutes before breakfast.   QUEtiapine (SEROQUEL) 25 MG tablet Take 25 mg by mouth 2 (two) times daily.   rosuvastatin (CRESTOR) 5 MG tablet Take 5 mg by mouth every morning.   triamcinolone cream (KENALOG) 0.1 % Apply 1 Application topically 2 (two) times daily as needed.   acetaminophen (TYLENOL) 500 MG tablet Take 1,000 mg by mouth 2 (two) times daily. Scheduled and 2 by mouth as needed   LORazepam (ATIVAN) 0.5 MG tablet Take 1 tablet (0.5 mg total) by mouth 2 (two) times daily as needed for anxiety. (Patient not taking: Reported on 02/25/2023)   memantine (NAMENDA) 10 MG tablet Take 10 mg by mouth at bedtime. (Patient not taking: Reported on  02/25/2023)   [DISCONTINUED] apixaban (ELIQUIS) 2.5 MG TABS tablet Take 1 tablet (2.5 mg total) by mouth 2 (two) times daily.   No facility-administered encounter medications on file as of 02/25/2023.    Review of Systems  Constitutional:  Negative for activity change, appetite change, chills, diaphoresis, fatigue, fever and unexpected weight change.  Respiratory:  Negative for cough, shortness of breath, wheezing and stridor.   Cardiovascular:  Positive for leg swelling. Negative for chest pain and palpitations.  Gastrointestinal:  Negative for abdominal distention, abdominal pain, constipation and diarrhea.  Genitourinary:  Negative for difficulty urinating and dysuria.  Musculoskeletal:  Positive for gait problem. Negative for arthralgias, back pain, joint swelling and myalgias.  Skin:  Positive for rash.  Neurological:  Negative for dizziness, seizures, syncope, facial asymmetry, speech difficulty, weakness and headaches.  Hematological:  Negative for adenopathy. Does not bruise/bleed easily.  Psychiatric/Behavioral:  Positive for behavioral problems and confusion.     Immunization History  Administered Date(s) Administered   Fluad Quad(high Dose 65+) 07/09/2019, 08/15/2020, 09/16/2021, 07/29/2022   Influenza Split 08/10/2011, 08/15/2012   Influenza Whole 08/21/2009, 09/22/2010   Influenza, High Dose Seasonal PF 09/16/2015, 08/18/2016, 08/31/2017, 08/14/2018   Influenza,inj,Quad PF,6+ Mos 08/06/2013   Influenza-Unspecified 08/31/2017   PFIZER(Purple Top)SARS-COV-2 Vaccination 12/11/2019, 01/01/2020, 08/12/2020   Pneumococcal Conjugate-13 10/02/2013   Pneumococcal Polysaccharide-23 06/04/2010   Tdap 10/22/2011, 09/22/2022   Zoster Recombinat (Shingrix) 03/09/2017, 05/10/2017   Pertinent  Health Maintenance Due  Topic Date Due   INFLUENZA VACCINE  06/16/2023      10/05/2022    3:40 PM 10/12/2022    9:31 AM 11/01/2022    2:52 PM 11/02/2022    9:04 AM 11/30/2022    9:57 AM   Fall Risk  Falls in the past year? 1 1 1 1 1   Was there an injury with Fall? 1 1 1 1 1   Fall Risk Category Calculator 3 3 3 3 2   Fall Risk Category (Retired) [employer]   (RETIRED) Patient Fall Risk Level High fall risk High fall risk High fall risk High fall risk   Patient at Risk for Falls Due to History of fall(s);Impaired balance/gait History of fall(s);Impaired balance/gait;Impaired mobility  History of  fall(s);Impaired balance/gait;Impaired mobility No Fall Risks  Fall risk Follow up Falls evaluation completed Falls evaluation completed Falls evaluation completed Falls evaluation completed Falls evaluation completed   Functional Status Survey:    Vitals:   02/25/23 1009  BP: (!) 142/80  Pulse: 69  Resp: 20  Temp: (!) 96.2 F (35.7 C)  TempSrc: Temporal  SpO2: 95%  Height:  (1.778 m)   Body mass index is 26.98 kg/m. Physical Exam Vitals and nursing note reviewed.  Constitutional:      General: He is not in acute distress.    Appearance: He is not diaphoretic.  HENT:     Head: Normocephalic and atraumatic.  Neck:     Thyroid: No thyromegaly.     Vascular: No JVD.     Trachea: No tracheal deviation.  Cardiovascular:     Rate and Rhythm: Normal rate and regular rhythm.     Heart sounds: No murmur heard. Pulmonary:     Effort: Pulmonary effort is normal. No respiratory distress.     Breath sounds: Normal breath sounds. No wheezing.  Abdominal:     General: Bowel sounds are normal. There is no distension.     Palpations: Abdomen is soft.     Tenderness: There is no abdominal tenderness.  Musculoskeletal:     Comments: +1 edema BLE  Lymphadenopathy:     Cervical: No cervical adenopathy.  Skin:    General: Skin is warm and dry.     Findings: Erythema and rash present.     Comments: Very dry skin noted to arms, chest legs Maculopapular rash noted to chest, arms, back Also has circular scaling dry flat rash areas to chest and back   Neurological:      Mental Status: He is alert and oriented to person, place, and time.     Cranial Nerves: No cranial nerve deficit.     Labs reviewed: Recent Labs    10/04/22 0552 10/05/22 0243 10/11/22 0600 12/26/22 1414 01/26/23 0000  NA 142 139 143 136 138  K 3.8 3.8 4.1 3.6 4.2  CL 106 107 107 102 105  CO2 26 23 25* 22 19  GLUCOSE 97 109*  --  105*  --   BUN CREATININE 0.95 0.93 0.9 0.90 0.9  CALCIUM 8.8* 8.7* 8.8 9.0 9.0   Recent Labs    07/29/22 1345 10/02/22 0350 10/11/22 0600 12/26/22 1414 01/26/23 0000  AST ALT ALKPHOS 58 61 89 85 79  BILITOT 0.5 0.6  --  1.0  --   PROT 7.7 7.0  --  7.8  --   ALBUMIN 4.1 4.0 3.7 3.8 3.8   Recent Labs    07/29/22 1345 09/24/22 0000 10/04/22 0552 10/05/22 0243 10/11/22 0600 12/26/22 1414  WBC 5.3   < > 6.1 6.4 5.4 8.1  NEUTROABS 2.7  --   --   --   --  6.2  HGB 12.8*   < > 11.3* 10.8* 10.8* 12.7*  HCT 38.2*   < > 33.7* 32.2* 32* 38.3*  MCV 99.3   < > 98.5 98.8  --  97.0  PLT 274.0   < > 296 277  --  272   < > = values in this interval not displayed.   Lab Results  Component Value Date   TSH 0.79 01/26/2023   Lab Results  Component Value Date   HGBA1C 6.4 08/30/2017  Lab Results  Component Value Date   CHOL 126 01/26/2023   HDL 36 01/26/2023   LDLCALC 39 01/26/2023   LDLDIRECT 168.9 05/24/2013   TRIG 254 (A) 01/26/2023   CHOLHDL 4.2 08/04/2020    Significant Diagnostic Results in last 30 days:  No results found.  Assessment/Plan  1. 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  He has had a prior documented "allergy" to namenda but this may be due to s/e rather than true allergy. At this time will discontinue the namenda and try prednisone. If he is not improving in the next few days would taper depakote and/or seroquel next  one at time. Vistaril is already ordered prn for the rash so the nurse can also use this for itching if needed.   - predniSONE (DELTASONE) 20 MG tablet; Take 1 tablet (20 mg total) by mouth in the morning and at bedtime.  Dispense: 10 tablet; Refill: 0  2. Xerosis of skin  - Skin Protectants, Misc. (EUCERIN) cream; Apply topically daily.  Dispense: 454 g; Refill: 0   Family/ staff Communication: nurse  Labs/tests ordered:  NA   Total time :  time greater than 50% of total time spent doing pt counseling and coordination of care

## 2023-02-28 DIAGNOSIS — L821 Other seborrheic keratosis: Secondary | ICD-10-CM | POA: Diagnosis not present

## 2023-02-28 DIAGNOSIS — L308 Other specified dermatitis: Secondary | ICD-10-CM | POA: Diagnosis not present

## 2023-02-28 DIAGNOSIS — L812 Freckles: Secondary | ICD-10-CM | POA: Diagnosis not present

## 2023-02-28 DIAGNOSIS — D1801 Hemangioma of skin and subcutaneous tissue: Secondary | ICD-10-CM | POA: Diagnosis not present

## 2023-02-28 DIAGNOSIS — Z85828 Personal history of other malignant neoplasm of skin: Secondary | ICD-10-CM | POA: Diagnosis not present

## 2023-03-11 DIAGNOSIS — Z23 Encounter for immunization: Secondary | ICD-10-CM | POA: Diagnosis not present

## 2023-03-17 ENCOUNTER — Non-Acute Institutional Stay (SKILLED_NURSING_FACILITY): Payer: Medicare Other | Admitting: Adult Health

## 2023-03-17 ENCOUNTER — Encounter: Payer: Self-pay | Admitting: Adult Health

## 2023-03-17 DIAGNOSIS — R21 Rash and other nonspecific skin eruption: Secondary | ICD-10-CM | POA: Diagnosis not present

## 2023-03-17 DIAGNOSIS — G308 Other Alzheimer's disease: Secondary | ICD-10-CM

## 2023-03-17 DIAGNOSIS — F02B3 Dementia in other diseases classified elsewhere, moderate, with mood disturbance: Secondary | ICD-10-CM | POA: Diagnosis not present

## 2023-03-17 NOTE — Progress Notes (Signed)
Location:  [facility] Room Number: 306-A Place of Service:  SNF [phone]) Provider:  Tamsen Roers, MD  Patient Care Team: [name], MD as PCP - General (Internal Medicine) Quintella Reichert, MD as PCP - Cardiology (Cardiology) Hillis Range, MD (Inactive) as PCP - Electrophysiology (Cardiology) Hilarie Fredrickson, MD (Gastroenterology) Hillis Range, MD (Inactive) (Cardiology) Donnetta Hail, MD as Consulting Physician (Rheumatology) Berneice Heinrich Delbert Phenix., MD as Consulting Physician (Urology) Antony Contras, MD as Consulting Physician (Ophthalmology) Dannielle Burn (Dentistry) Eileen Stanford, MD as Referring Physician (Allergy and Immunology) Van Clines, MD as Consulting Physician (Neurology) Kathyrn Sheriff, [facility] as Pharmacist (Pharmacist) Van Clines, MD as Consulting Physician (Neurology)  Extended Emergency Contact Information Primary Emergency Contact: Nies,[name] Address: [address]          [place], [place] Home Phone: [phone] Work Phone: [phone] Mobile Phone: [phone] Relation: Spouse Secondary Emergency Contact: [name],[name] Mobile Phone: [phone] Relation: Daughter  Code Status:  DNR Goals of care: Advanced Directive information    03/17/2023    8:57 AM  Advanced Directives  Does Patient Have a Medical Advance Directive? Yes  Type of Estate agent of [place];Living will;Out of facility DNR (pink MOST or yellow form)  Does patient want to make changes to medical advance directive? No - Patient declined  Copy of Healthcare Power of Attorney in Chart? Yes - validated most recent copy scanned in chart (See row information)  Pre-existing out of facility DNR order (yellow form or pink MOST form) Yellow form placed in chart (order not valid for inpatient use)     Chief Complaint  Patient presents with   Acute Visit    Rash     HPI:  Pt is a 89 y.o. male seen today for an acute visit for rash.  Nurse reports the resident has an itchy erythematous rash to arms, legs, and trunk. He is not having any sob, wheezing, or fever. No new detergent.  He was seen on 4/12 for this rash with concern for drug reaction. There were three new meds. Namenda was discontinued and prednisone started. He was seen by dermatology after the prednisone was started and triamcinolone added. He does have a hx of eczema.  He remains on depakote and seroquel for behaviors which are also new meds for him. They are helping with behaviors of anger, wanting exit, etc. The rash transiently improved after the prednisone but has returned.    Past Medical History:  Diagnosis Date   CAD (coronary artery disease)    a. s/p inferior STEMI 01/08/2014; LHC 01/08/14: total RCA occlusion s/p 3.5x61mm Xience DES distal RCA and 3.5x28 mm DES mid RCA, 60-70% mid LAD stenosis, EF 55%.   Complication of anesthesia    'long to wake up after back surgery " 09/2003   Diverticulosis of colon    GERD (gastroesophageal reflux disease)    History of cellulitis    05-26-2015  LLE   History of colon polyps    1998- benign/  2008 adenomatous    History of kidney stones    2013   History of squamous cell carcinoma excision    2013;  2015;  06-12-2015 right leg/  02/ and 05/ 2017  left ear and left leg   Hydronephrosis, left    Hyperlipidemia    Hypertension    Migraine with aura    OSA on CPAP 06/19/2015   Moderate  OSA with AHI 18/hr  per study 05-20-2015   Osteoarthritis    Paroxysmal atrial fibrillation (HCC) 4/16   chads2vasc score is at least 4   Premature atrial contractions    RA (rheumatoid arthritis) [facility])    rheumatologist-  dr Alben Deeds   Sinusitis, chronic 10/17/2015   Wears glasses    Wears hearing aid    bilateral   Past Surgical History:  Procedure Laterality Date   BACK SURGERY     CARDIOVASCULAR STRESS TEST  11/21/2015   Low risk nuclear  study w/ a small diaphragmatic attenuation artifact, no ischemia/  normal LV function and wall motion , ef 63%   CATARACT EXTRACTION W/ INTRAOCULAR LENS  IMPLANT, BILATERAL  2015   COLONOSCOPY  last one 06-09-2011   CORONARY ANGIOPLASTY     CYSTOSCOPY W/ RETROGRADES Left 07/12/2016   Procedure: CYSTOSCOPY WITH RETROGRADE PYELOGRAM LEFT URETERAL STENT;  Surgeon: Bjorn Pippin, MD;  Location: [facility];  Service: Urology;  Laterality: Left;   CYSTOSCOPY WITH RETROGRADE PYELOGRAM, URETEROSCOPY AND STENT PLACEMENT Left 08/11/2016   Procedure: CYSTOSCOPY WITH RETROGRADE PYELOGRAM,  DIAGNOSTIC URETEROSCOPY , STENT EXCHANGE;  Surgeon: Sebastian Ache, MD;  Location: [facility];  Service: Urology;  Laterality: Left;   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   severe fibromatosis palm and fingers   LEFT HEART CATHETERIZATION WITH CORONARY ANGIOGRAM N/A 01/08/2014   Procedure: LEFT HEART CATHETERIZATION WITH CORONARY ANGIOGRAM;  Surgeon: Lennette Bihari, MD;  Location[facility] C[facility] LAB;  Service: Cardiovascular;  Laterality:N/A;  total/ subtotal RCA/  mLAD 60-70% w/ mid systolic bridging/  preserved global LVF, ef 55%   MOHS SURGERY  x2  feb and may 2017   left ear /  left leg  (SCC)   PERCUTANEOUS CORONARY STENT INTERVENTION (PCI-S)  01/08/2014   Procedure: PERCUTANEOUS CORONARY STENT INTERVENTION (PCI-S);  Surgeon: Lennette Bihari, MD;  Location: [facility] CATH LAB;  Service: Cardiovascular;;  DES to mid and distal RCA   POSTERIOR LUMBAR FUSION  10/01/2003   and Laminectomy/ diskectomy  L4 -- S1   ROBOT ASSISTED PYELOPLASTY Left 09/15/2016   Procedure: XI ROBOTIC ASSISTED PYELOPLASTY;  Surgeon: Sebastian Ache, MD;  Location: [facility];  Service: Urology;  Laterality: Left;   TONSILLECTOMY     TRANSTHORACIC ECHOCARDIOGRAM  02/23/2016   mild LVH,  ef 50-55%,  grade 1 diastolic dysfunction/  mild to moderate AV calcification w/ no stenosis or regurg./  trivial MR and TR/  mild PR    Allergies  Allergen  Reactions   Methotrexate Other (See Comments)    REACTION: tachycardia   Aricept [Donepezil Hcl]     nightmares    Crestor [Rosuvastatin Calcium]     Myalgias with 20mg  dose   Lisinopril Other (See Comments)    cough   Namenda [Memantine]     n/d   Atorvastatin Other (See Comments)    Muscle pain, leg cramps   Pravastatin Sodium Other (See Comments)    REACTION: cramps, fatigue    Outpatient Encounter Medications as of 03/17/2023  Medication Sig   acetaminophen (TYLENOL) 500 MG tablet Take 1,000 mg by mouth 2 (two) times daily. Scheduled and 2 by mouth as needed   apixaban (ELIQUIS) 5 MG TABS tablet Take 1 tablet (5 mg total) by mouth 2 (two) times daily.   divalproex (DEPAKOTE SPRINKLE) 125 MG capsule Take 125 mg by mouth 3 (three) times daily.   donepezil (ARICEPT) 5 MG tablet TAKE ONE TABLET BY MOUTH EVERY MORNING  escitalopram (LEXAPRO) 20 MG tablet Take 20 mg by mouth daily.   Evolocumab (REPATHA SURECLICK) 140 MG/ML SOAJ INJECT 1 PEN INTO SKIN EVERY 14 DAYS   ezetimibe (ZETIA) 10 MG tablet Take 5 mg by mouth daily.   gabapentin (NEURONTIN) 300 MG capsule Take 1 capsule (300 mg total) by mouth 3 (three) times daily.   hydrOXYzine (VISTARIL) 25 MG capsule Take 1 capsule (25 mg total) by mouth every 8 (eight) hours as needed.   LORazepam (ATIVAN) 0.5 MG tablet Take 0.5 mg by mouth as needed for anxiety.   melatonin 5 MG TABS Take 5 mg by mouth at bedtime.   metoprolol tartrate (LOPRESSOR) 25 MG tablet Take 25 mg by mouth at bedtime.   nitroGLYCERIN (NITROSTAT) 0.4 MG SL tablet Place 0.4 mg under the tongue every 5 (five) minutes as needed for chest pain.   omeprazole (PRILOSEC) 40 MG capsule Take 40 mg by mouth daily. Take on empty stomach 30 minutes before breakfast.   QUEtiapine (SEROQUEL) 25 MG tablet Take 25 mg by mouth 2 (two) times daily.   rosuvastatin (CRESTOR) 5 MG tablet Take 5 mg by mouth every morning.   triamcinolone cream (KENALOG) 0.1 % Apply 1 Application  topically 2 (two) times daily as needed.   Skin Protectants, Misc. (EUCERIN) cream Apply topically daily. (Patient not taking: Reported on 03/17/2023)   [DISCONTINUED] apixaban (ELIQUIS) 2.5 MG TABS tablet Take 1 tablet (2.5 mg total) by mouth 2 (two) times daily.   [DISCONTINUED] predniSONE (DELTASONE) 20 MG tablet Take 1 tablet (20 mg total) by mouth in the morning and at bedtime.   No facility-administered encounter medications on file as of 03/17/2023.    Review of Systems  Constitutional:  Negative for activity change, appetite change, chills, diaphoresis, fatigue, fever and unexpected weight change.  Respiratory:  Negative for cough, shortness of breath, wheezing and stridor.   Cardiovascular:  Negative for chest pain, palpitations and leg swelling.  Gastrointestinal:  Negative for abdominal distention, abdominal pain, constipation and diarrhea.  Genitourinary:  Negative for difficulty urinating and dysuria.  Musculoskeletal:  Positive for gait problem. Negative for arthralgias, back pain, joint swelling and myalgias.  Skin:  Positive for rash.  Neurological:  Negative for dizziness, seizures, syncope, facial asymmetry, speech difficulty, weakness and headaches.  Hematological:  Negative for adenopathy. Does not bruise/bleed easily.  Psychiatric/Behavioral:  Positive for agitation (improved), behavioral problems and confusion.     Immunization History  Administered Date(s) Administered   Fluad Quad(high Dose 65+) 07/09/2019, 08/15/2020, 09/16/2021, 07/29/2022   Influenza Split 08/10/2011, 08/15/2012   Influenza Whole 08/21/2009, 09/22/2010   Influenza, High Dose Seasonal PF 09/16/2015, 08/18/2016, 08/31/2017, 08/14/2018   Influenza,inj,Quad PF,6+ Mos 08/06/2013   Influenza-Unspecified 08/31/2017   PFIZER(Purple Top)SARS-COV-2 Vaccination 12/11/2019, 01/01/2020, 08/12/2020   Pneumococcal Conjugate-13 10/02/2013   Pneumococcal Polysaccharide-23 06/04/2010   Tdap 10/22/2011, 09/22/2022    Zoster Recombinat (Shingrix) 03/09/2017, 05/10/2017   Pertinent  Health Maintenance Due  Topic Date Due   INFLUENZA VACCINE  06/16/2023      10/05/2022    3:40 PM 10/12/2022    9:31 AM 11/01/2022    2:52 PM 11/02/2022    9:04 AM 11/30/2022    9:57 AM  Fall Risk  Falls in the past year? 1 1 1 1 1   Was there an injury with Fall? 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4 oz (87.7 kg)  Height: 6' (1.829 m)   Body mass index is 26.23 kg/m. Physical Exam Vitals and nursing note reviewed.  Constitutional:      General: He is not in acute distress.    Appearance: He is not diaphoretic.  HENT:     Head: Normocephalic and atraumatic.  Neck:     Thyroid: No thyromegaly.     Vascular: No JVD.     Trachea: No tracheal deviation.  Cardiovascular:     Rate and Rhythm: Normal rate and regular rhythm.     Heart sounds: No murmur heard. Pulmonary:     Effort: Pulmonary effort is normal. No respiratory distress.     Breath sounds: Normal breath sounds. No wheezing.  Abdominal:     General: Bowel sounds are normal. There is no distension.     Palpations: Abdomen is soft.     Tenderness: There is no abdominal tenderness.  Musculoskeletal:     Cervical back: Neck supple.  Lymphadenopathy:     Cervical: No cervical  adenopathy.  Skin:    General: Skin is warm and dry.  Neurological:     Mental Status: He is alert and oriented to person, place, and time.     Cranial Nerves: No cranial nerve deficit.     Labs reviewed: Recent Labs    10/04/22 0552 10/05/22 0243 10/11/22 0600 12/26/22 1414 01/26/23 0000  NA 142 139 143 136 138  K 3.8 3.8 4.1 3.6 4.2  CL 106 107 107 102 105  CO2 26 23 25* 22 19  GLUCOSE 97 109*  --  105*  --   BUN 12 14 16 11 13   CREATININE 0.95 0.93 0.9 0.90 0.9  CALCIUM 8.8* 8.7* 8.8 9.0 9.0   Recent Labs    07/29/22 1345 10/02/22 0350 10/11/22 0600 12/26/22 1414 01/26/23 0000  AST 17 24 21 30 19   ALT 13 16 13 12 12   ALKPHOS 58 61 89 85 79  BILITOT 0.5 0.6  --  1.0  --   PROT 7.7 7.0  --  7.8  --   ALBUMIN 4.1 4.0 3.7 3.8 3.8   Recent Labs    07/29/22 1345 09/24/22 0000 10/04/22 0552 10/05/22 0243 10/11/22 0600 12/26/22 1414  WBC 5.3   < > 6.1 6.4 5.4 8.1  NEUTROABS 2.7  --   --   --   --  6.2  HGB 12.8*   < > 11.3* 10.8* 10.8* 12.7*  HCT 38.2*   < > 33.7* 32.2* 32* 38.3*  MCV 99.3   < > 98.5 98.8  --  97.0  PLT 274.0   < > 296 277  --  272   < > = values in this interval not displayed.   Lab Results  Component Value Date   TSH 0.79 01/26/2023   Lab Results  Component Value Date   HGBA1C 6.4 08/30/2017   Lab Results  Component Value Date   CHOL 126 01/26/2023   HDL 36 01/26/2023   LDLCALC 39 01/26/2023   LDLDIRECT 168.9 05/24/2013   TRIG 254 (A) 01/26/2023   CHOLHDL 4.2 08/04/2020    Significant Diagnostic Results in last 30 days:  No results found.  Assessment/Plan  1. Rash Will taper Depakote next 125 mg tid x 1 week, then 1 qd x 1 week then discontinue.  Distribution of the rash appears to be a drug reaction.  Add triamcinolone bid x 2 weeks Has vistaril prn for itching.    2. 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 He appears to have a drug rash  and we are trying to identify the cause through process of elimination.  Will keep seroquel and prn vistaril for now Nurse reports he has psych follow up ordered.   Family/ staff Communication: discussed with his wife [name], she verbalized understanding.   Labs/tests ordered:  NA

## 2023-03-21 ENCOUNTER — Non-Acute Institutional Stay (SKILLED_NURSING_FACILITY): Payer: Medicare Other | Admitting: Internal Medicine

## 2023-03-21 ENCOUNTER — Encounter: Payer: Self-pay | Admitting: Internal Medicine

## 2023-03-21 DIAGNOSIS — R21 Rash and other nonspecific skin eruption: Secondary | ICD-10-CM | POA: Diagnosis not present

## 2023-03-21 DIAGNOSIS — I2510 Atherosclerotic heart disease of native coronary artery without angina pectoris: Secondary | ICD-10-CM | POA: Diagnosis not present

## 2023-03-21 DIAGNOSIS — F02B3 Dementia in other diseases classified elsewhere, moderate, with mood disturbance: Secondary | ICD-10-CM | POA: Diagnosis not present

## 2023-03-21 DIAGNOSIS — I480 Paroxysmal atrial fibrillation: Secondary | ICD-10-CM

## 2023-03-21 DIAGNOSIS — E782 Mixed hyperlipidemia: Secondary | ICD-10-CM

## 2023-03-21 DIAGNOSIS — M069 Rheumatoid arthritis, unspecified: Secondary | ICD-10-CM | POA: Diagnosis not present

## 2023-03-21 DIAGNOSIS — I10 Essential (primary) hypertension: Secondary | ICD-10-CM

## 2023-03-21 DIAGNOSIS — G308 Other Alzheimer's disease: Secondary | ICD-10-CM | POA: Diagnosis not present

## 2023-03-21 DIAGNOSIS — F03911 Unspecified dementia, unspecified severity, with agitation: Secondary | ICD-10-CM

## 2023-03-21 DIAGNOSIS — K219 Gastro-esophageal reflux disease without esophagitis: Secondary | ICD-10-CM

## 2023-03-21 NOTE — Progress Notes (Signed)
Location:  [facility] Room Number: 306A Place of Service:  SNF [phone]) Provider:  [name], MD   [name], MD  Patient Care Team: [name], MD as PCP - General (Internal Medicine) Quintella Reichert, MD as PCP - Cardiology (Cardiology) Hillis Range, MD (Inactive) as PCP - Electrophysiology (Cardiology) Hilarie Fredrickson, MD (Gastroenterology) Hillis Range, MD (Inactive) (Cardiology) Donnetta Hail, MD as Consulting Physician (Rheumatology) Berneice Heinrich Delbert Phenix., MD as Consulting Physician (Urology) Antony Contras, MD as Consulting Physician (Ophthalmology) Dannielle Burn (Dentistry) Eileen Stanford, MD as Referring Physician (Allergy and Immunology) Van Clines, MD as Consulting Physician (Neurology) Kathyrn Sheriff, [facility] as Pharmacist (Pharmacist) Van Clines, MD as Consulting Physician (Neurology)  Extended Emergency Contact Information Primary Emergency Contact: Wuellner,[name] Address: [address]          [place], [place] Home Phone: [phone] Work Phone: [phone] Mobile Phone: [phone] Relation: Spouse Secondary Emergency Contact: [name],[name] Mobile Phone: [phone] Relation: Daughter  Code Status:  DNR Goals of care: Advanced Directive information    03/21/2023    9:55 AM  Advanced Directives  Does Patient Have a Medical Advance Directive? Yes  Type of Estate agent of [place];Living will;Out of facility DNR (pink MOST or yellow form)  Does patient want to make changes to medical advance directive? No - Patient declined  Copy of Healthcare Power of Attorney in Chart? Yes - validated most recent copy scanned in chart (See row information)  Pre-existing out of facility DNR order (yellow form or pink MOST form) Yellow form placed in chart (order not valid for inpatient use)     Chief Complaint  Patient presents with   Medical Management of  Chronic Issues    Patient is being seen for routine visit    HPI:  Pt is a 89 y.o. male seen today for medical management of chronic diseases.   Patient now lives in SNF in memory unit   Patient has h/o Dementia with behavior issues, Rheumatoid Arthritis, A Fib, HTN, CAD s/p PCI, GERD and Sleep apnea And Depression   SDH due to Fall in 11/23   Wife was unable to take care of him so admitted in the Memory unit now  Was doing well on Depakote and Seroquel for his behaviors Unfortunately Developed rash  Was taken off Namenda and now tapering off Depakote His rash is better though he is on smaller dose of Depakote But now nurses think his behaviors are wose but so far they have been able to handle them with PRN ativan  He did not have any acute issue Walking with no assist. No Falls Wt Readings from Last 3 Encounters:  03/21/23 193 lb 6.4 oz (87.7 kg)  03/17/23 193 lb 6.4 oz (87.7 kg)  02/07/23 188 lb (85.3 kg)     Past Medical History:  Diagnosis Date   CAD (coronary artery disease)    a. s/p inferior STEMI 01/08/2014; LHC 01/08/14: total RCA occlusion s/p 3.5x15mm Xience DES distal RCA and 3.5x28 mm DES mid RCA, 60-70% mid LAD stenosis, EF 55%.   Complication of anesthesia    'long to wake up after back surgery " 09/2003   Diverticulosis of colon    GERD (gastroesophageal reflux disease)    History of cellulitis    05-26-2015  LLE   History of colon polyps    1998- benign/  2008 adenomatous    History of kidney  stones    2013   History of squamous cell carcinoma excision    2013;  2015;  06-12-2015 right leg/  02/ and 05/ 2017  left ear and left leg   Hydronephrosis, left    Hyperlipidemia    Hypertension    Migraine with aura    OSA on CPAP 06/19/2015   Moderate OSA with AHI 18/hr  per study 05-20-2015   Osteoarthritis    Paroxysmal atrial fibrillation (HCC) 4/16   chads2vasc score is at least 4   Premature atrial contractions    RA (rheumatoid arthritis) [facility])     rheumatologist-  dr Alben Deeds   Sinusitis, chronic 10/17/2015   Wears glasses    Wears hearing aid    bilateral   Past Surgical History:  Procedure Laterality Date   BACK SURGERY     CARDIOVASCULAR STRESS TEST  11/21/2015   Low risk nuclear study w/ a small diaphragmatic attenuation artifact, no ischemia/  normal LV function and wall motion , ef 63%   CATARACT EXTRACTION W/ INTRAOCULAR LENS  IMPLANT, BILATERAL  2015   COLONOSCOPY  last one 06-09-2011   CORONARY ANGIOPLASTY     CYSTOSCOPY W/ RETROGRADES Left 07/12/2016   Procedure: CYSTOSCOPY WITH RETROGRADE PYELOGRAM LEFT URETERAL STENT;  Surgeon: Bjorn Pippin, MD;  Location: [facility];  Service: Urology;  Laterality: Left;   CYSTOSCOPY WITH RETROGRADE PYELOGRAM, URETEROSCOPY AND STENT PLACEMENT Left 08/11/2016   Procedure: CYSTOSCOPY WITH RETROGRADE PYELOGRAM,  DIAGNOSTIC URETEROSCOPY , STENT EXCHANGE;  Surgeon: Sebastian Ache, MD;  [facility];  Service: Urology;  Laterality: Left;   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   severe fibromatosis palm and fingers   LEFT HEART CATHETERIZATION WITH CORONARY ANGIOGRAM N/A 01/08/2014   Procedure: LEFT HEART CATHETERIZATION WITH CORONARY ANGIOGRAM;  Surgeon: Lennette Bihari, MD;  Location: [facility] CATH LAB;  Service: Cardiovascular;  Laterality:N/A;  total/ subtotal RCA/  mLAD 60-70% w/ mid systolic bridging/  preserved global LVF, ef 55%   MOHS SURGERY  x2  feb and may 2017   left ear /  left leg  (SCC)   PERCUTANEOUS CORONARY STENT INTERVENTION (PCI-S)  01/08/2014   Procedure: PERCUTANEOUS CORONARY STENT INTERVENTION (PCI-S);  Surgeon: Lennette Bihari, MD;  Location: [facility] CATH LAB;  Service: Cardiovascular;;  DES to mid and distal RCA   POSTERIOR LUMBAR FUSION  10/01/2003   and Laminectomy/ diskectomy  L4 -- S1   ROBOT ASSISTED PYELOPLASTY Left 09/15/2016   Procedure: XI ROBOTIC ASSISTED PYELOPLASTY;  Surgeon: Sebastian Ache, MD;  Location: [facility];  Service: Urology;   Laterality: Left;   TONSILLECTOMY     TRANSTHORACIC ECHOCARDIOGRAM  02/23/2016   mild LVH,  ef 50-55%,  grade 1 diastolic dysfunction/  mild to moderate AV calcification w/ no stenosis or regurg./  trivial MR and TR/  mild PR    Allergies  Allergen Reactions   Methotrexate Other (See Comments)    REACTION: tachycardia   Aricept [Donepezil Hcl]     nightmares    Crestor [Rosuvastatin Calcium]     Myalgias with 20mg  dose   Lisinopril Other (See Comments)    cough   Namenda [Memantine]     n/d   Atorvastatin Other (See Comments)    Muscle pain, leg cramps   Pravastatin Sodium Other (See Comments)    REACTION: cramps, fatigue    Outpatient Encounter Medications as of 03/21/2023  Medication Sig   acetaminophen (TYLENOL) 500 MG tablet Take 1,000 mg by mouth 2 (two)  times daily. Scheduled and 2 by mouth as needed   apixaban (ELIQUIS) 5 MG TABS tablet Take 1 tablet (5 mg total) by mouth 2 (two) times daily.   divalproex (DEPAKOTE SPRINKLE) 125 MG capsule Take 125 mg by mouth 3 (three) times daily. 125 mg tid x 1 week then qd x 1 week the d/c   donepezil (ARICEPT) 5 MG tablet TAKE ONE TABLET BY MOUTH EVERY MORNING   escitalopram (LEXAPRO) 20 MG tablet Take 20 mg by mouth daily.   Evolocumab (REPATHA SURECLICK) 140 MG/ML SOAJ INJECT 1 PEN INTO SKIN EVERY 14 DAYS   ezetimibe (ZETIA) 10 MG tablet Take 5 mg by mouth daily.   gabapentin (NEURONTIN) 300 MG capsule Take 1 capsule (300 mg total) by mouth 3 (three) times daily.   hydrOXYzine (VISTARIL) 25 MG capsule Take 1 capsule (25 mg total) by mouth every 8 (eight) hours as needed.   LORazepam (ATIVAN) 0.5 MG tablet Take 0.5 mg by mouth as needed for anxiety.   melatonin 5 MG TABS Take 5 mg by mouth at bedtime.   metoprolol tartrate (LOPRESSOR) 25 MG tablet Take 25 mg by mouth at bedtime.   nitroGLYCERIN (NITROSTAT) 0.4 MG SL tablet Place 0.4 mg under the tongue every 5 (five) minutes as needed for chest pain.   omeprazole (PRILOSEC) 40 MG  capsule Take 40 mg by mouth daily. Take on empty stomach 30 minutes before breakfast.   QUEtiapine (SEROQUEL) 25 MG tablet Take 25 mg by mouth 2 (two) times daily.   rosuvastatin (CRESTOR) 5 MG tablet Take 5 mg by mouth every morning.   triamcinolone cream (KENALOG) 0.1 % Apply 1 Application topically 2 (two) times daily.   [DISCONTINUED] apixaban (ELIQUIS) 2.5 MG TABS tablet Take 1 tablet (2.5 mg total) by mouth 2 (two) times daily.   No facility-administered encounter medications on file as of 03/21/2023.    Review of Systems  Unable to perform ROS: Dementia    Immunization History  Administered Date(s) Administered   Fluad Quad(high Dose 65+) 07/09/2019, 08/15/2020, 09/16/2021, 07/29/2022   Influenza Split 08/10/2011, 08/15/2012   Influenza Whole 08/21/2009, 09/22/2010   Influenza, High Dose Seasonal PF 09/16/2015, 08/18/2016, 08/31/2017, 08/14/2018   Influenza,inj,Quad PF,6+ Mos 08/06/2013   Influenza-Unspecified 08/31/2017   PFIZER(Purple Top)SARS-COV-2 Vaccination 12/11/2019, 01/01/2020, 08/12/2020   Pneumococcal Conjugate-13 10/02/2013   Pneumococcal Polysaccharide-23 06/04/2010   Tdap 10/22/2011, 09/22/2022   Zoster Recombinat (Shingrix) 03/09/2017, 05/10/2017   Pertinent  Health Maintenance Due  Topic Date Due   INFLUENZA VACCINE  06/16/2023      10/05/2022    3:40 PM 10/12/2022    9:31 AM 11/01/2022    2:52 PM 11/02/2022    9:04 AM 11/30/2022    9:57 AM  Fall Risk  Falls in the past year? 1 1 1 1 1   Was there an injury with Fall? 1 1 1 1 1   Fall Risk Category Calculator 3 3 3 3 2   Fall Risk Category (Retired) [employer]   (RETIRED) Patient Fall Risk Level High fall risk High fall risk High fall risk High fall risk   Patient at Risk for Falls Due to History of fall(s);Impaired balance/gait History of fall(s);Impaired balance/gait;Impaired mobility  History of fall(s);Impaired balance/gait;Impaired mobility No Fall Risks  Fall risk Follow up Falls evaluation  completed Falls evaluation completed Falls evaluation completed Falls evaluation completed Falls evaluation completed   Functional Status Survey:    Vitals:   03/21/23 0946  BP: (!) 169/69  Pulse: 70  Resp: 18  Temp: (!) 96.4 F (35.8 C)  TempSrc: Temporal  SpO2: 93%  Weight: 193 lb 6.4 oz (87.7 kg)  Height: 6' (1.829 m)   Body mass index is 26.23 kg/m. Physical Exam Vitals reviewed.  Constitutional:      Appearance: Normal appearance.  HENT:     Head: Normocephalic.     Nose: Nose normal.     Mouth/Throat:     Mouth: Mucous membranes are moist.     Pharynx: Oropharynx is clear.  Eyes:     Pupils: Pupils are equal, round, and reactive to light.  Cardiovascular:     Rate and Rhythm: Normal rate and regular rhythm.     Pulses: Normal pulses.     Heart sounds: No murmur heard. Pulmonary:     Effort: Pulmonary effort is normal. No respiratory distress.     Breath sounds: Normal breath sounds. No rales.  Abdominal:     General: Abdomen is flat. Bowel sounds are normal.     Palpations: Abdomen is soft.  Musculoskeletal:        General: No swelling.     Cervical back: Neck supple.  Skin:    General: Skin is warm.     Comments: Macular Rash which is almost Resolved  Neurological:     General: No focal deficit present.     Mental Status: He is alert.  Psychiatric:        Mood and Affect: Mood normal.        Thought Content: Thought content normal.     Labs reviewed: Recent Labs    10/04/22 0552 10/05/22 0243 10/11/22 0600 12/26/22 1414 01/26/23 0000  NA 142 139 143 136 138  K 3.8 3.8 4.1 3.6 4.2  CL 106 107 107 102 105  CO2 26 23 25* 22 19  GLUCOSE 97 109*  --  105*  --   BUN 12 14 16 11 13   CREATININE 0.95 0.93 0.9 0.90 0.9  CALCIUM 8.8* 8.7* 8.8 9.0 9.0   Recent Labs    07/29/22 1345 10/02/22 0350 10/11/22 0600 12/26/22 1414 01/26/23 0000  AST 17 24 21 30 19   ALT 13 16 13 12 12   ALKPHOS 58 61 89 85 79  BILITOT 0.5 0.6  --  1.0  --   PROT  7.7 7.0  --  7.8  --   ALBUMIN 4.1 4.0 3.7 3.8 3.8   Recent Labs    07/29/22 1345 09/24/22 0000 10/04/22 0552 10/05/22 0243 10/11/22 0600 12/26/22 1414  WBC 5.3   < > 6.1 6.4 5.4 8.1  NEUTROABS 2.7  --   --   --   --  6.2  HGB 12.8*   < > 11.3* 10.8* 10.8* 12.7*  HCT 38.2*   < > 33.7* 32.2* 32* 38.3*  MCV 99.3   < > 98.5 98.8  --  97.0  PLT 274.0   < > 296 277  --  272   < > = values in this interval not displayed.   Lab Results  Component Value Date   TSH 0.79 01/26/2023   Lab Results  Component Value Date   HGBA1C 6.4 08/30/2017   Lab Results  Component Value Date   CHOL 126 01/26/2023   HDL 36 01/26/2023   LDLCALC 39 01/26/2023   LDLDIRECT 168.9 05/24/2013   TRIG 254 (A) 01/26/2023   CHOLHDL 4.2 08/04/2020    Significant Diagnostic Results in last 30 days:  No results found.  Assessment/Plan 1.  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 Rash Is almost Resolved today Nurses Continue  to use Triamcinolone ? Allergy to Depakote  3. Agitation due to dementia (HCC) Will Continue Seroquel and Vistaril PRN On Lexapro 4. Paroxysmal atrial fibrillation (HCC) Eliquis and Metoprolol  5. Primary hypertension Check BP QD for 2 weeks  6. Mixed hyperlipidemia Repatha and Zetia and crestor LDL 39 in 03/24 7. Gastroesophageal reflux disease without esophagitis Prilosec  8. Rheumatoid arthritis involving elbow,  Orencia Q monthly per Dr Tawana Scale office was discontinued Not sure what the further plan is  9. Coronary artery disease involving native coronary artery of native heart without angina pectoris On Metoprolol and statin    Family/ staff Communication:   Labs/tests ordered:

## 2023-03-28 ENCOUNTER — Encounter (HOSPITAL_COMMUNITY): Payer: Self-pay

## 2023-03-28 ENCOUNTER — Emergency Department (HOSPITAL_COMMUNITY): Payer: Medicare Other

## 2023-03-28 ENCOUNTER — Other Ambulatory Visit: Payer: Self-pay

## 2023-03-28 ENCOUNTER — Inpatient Hospital Stay (HOSPITAL_COMMUNITY)
Admission: EM | Admit: 2023-03-28 | Discharge: 2023-04-01 | DRG: 516 | Disposition: A | Discharge status: Skilled Nursing Facility | Payer: Medicare Other | Source: Skilled Nursing Facility | Attending: Internal Medicine | Admitting: Internal Medicine

## 2023-03-28 DIAGNOSIS — E782 Mixed hyperlipidemia: Secondary | ICD-10-CM | POA: Diagnosis not present

## 2023-03-28 DIAGNOSIS — K219 Gastro-esophageal reflux disease without esophagitis: Secondary | ICD-10-CM | POA: Diagnosis present

## 2023-03-28 DIAGNOSIS — F02811 Dementia in other diseases classified elsewhere, unspecified severity, with agitation: Secondary | ICD-10-CM | POA: Diagnosis present

## 2023-03-28 DIAGNOSIS — Z66 Do not resuscitate: Secondary | ICD-10-CM | POA: Diagnosis present

## 2023-03-28 DIAGNOSIS — Z7901 Long term (current) use of anticoagulants: Secondary | ICD-10-CM | POA: Diagnosis not present

## 2023-03-28 DIAGNOSIS — S32431A Displaced fracture of anterior column [iliopubic] of right acetabulum, initial encounter for closed fracture: Secondary | ICD-10-CM | POA: Diagnosis not present

## 2023-03-28 DIAGNOSIS — Z974 Presence of external hearing-aid: Secondary | ICD-10-CM | POA: Diagnosis not present

## 2023-03-28 DIAGNOSIS — Z85828 Personal history of other malignant neoplasm of skin: Secondary | ICD-10-CM | POA: Diagnosis not present

## 2023-03-28 DIAGNOSIS — M5030 Other cervical disc degeneration, unspecified cervical region: Secondary | ICD-10-CM | POA: Diagnosis not present

## 2023-03-28 DIAGNOSIS — M069 Rheumatoid arthritis, unspecified: Secondary | ICD-10-CM | POA: Diagnosis not present

## 2023-03-28 DIAGNOSIS — Z87442 Personal history of urinary calculi: Secondary | ICD-10-CM | POA: Diagnosis not present

## 2023-03-28 DIAGNOSIS — Z9841 Cataract extraction status, right eye: Secondary | ICD-10-CM | POA: Diagnosis not present

## 2023-03-28 DIAGNOSIS — S32810A Multiple fractures of pelvis with stable disruption of pelvic ring, initial encounter for closed fracture: Secondary | ICD-10-CM | POA: Diagnosis not present

## 2023-03-28 DIAGNOSIS — S32401A Unspecified fracture of right acetabulum, initial encounter for closed fracture: Secondary | ICD-10-CM

## 2023-03-28 DIAGNOSIS — S32402A Unspecified fracture of left acetabulum, initial encounter for closed fracture: Secondary | ICD-10-CM | POA: Diagnosis not present

## 2023-03-28 DIAGNOSIS — Z961 Presence of intraocular lens: Secondary | ICD-10-CM | POA: Diagnosis not present

## 2023-03-28 DIAGNOSIS — W06XXXA Fall from bed, initial encounter: Secondary | ICD-10-CM | POA: Diagnosis present

## 2023-03-28 DIAGNOSIS — G309 Alzheimer's disease, unspecified: Secondary | ICD-10-CM | POA: Diagnosis present

## 2023-03-28 DIAGNOSIS — S32592A Other specified fracture of left pubis, initial encounter for closed fracture: Secondary | ICD-10-CM | POA: Diagnosis not present

## 2023-03-28 DIAGNOSIS — S32461A Displaced associated transverse-posterior fracture of right acetabulum, initial encounter for closed fracture: Secondary | ICD-10-CM | POA: Diagnosis not present

## 2023-03-28 DIAGNOSIS — R6889 Other general symptoms and signs: Secondary | ICD-10-CM | POA: Diagnosis not present

## 2023-03-28 DIAGNOSIS — Z79890 Hormone replacement therapy (postmenopausal): Secondary | ICD-10-CM

## 2023-03-28 DIAGNOSIS — M25551 Pain in right hip: Secondary | ICD-10-CM | POA: Diagnosis not present

## 2023-03-28 DIAGNOSIS — D62 Acute posthemorrhagic anemia: Secondary | ICD-10-CM | POA: Diagnosis not present

## 2023-03-28 DIAGNOSIS — I2510 Atherosclerotic heart disease of native coronary artery without angina pectoris: Secondary | ICD-10-CM | POA: Diagnosis present

## 2023-03-28 DIAGNOSIS — Z86010 Personal history of colonic polyps: Secondary | ICD-10-CM

## 2023-03-28 DIAGNOSIS — I252 Old myocardial infarction: Secondary | ICD-10-CM | POA: Diagnosis not present

## 2023-03-28 DIAGNOSIS — Z743 Need for continuous supervision: Secondary | ICD-10-CM | POA: Diagnosis not present

## 2023-03-28 DIAGNOSIS — Z4789 Encounter for other orthopedic aftercare: Secondary | ICD-10-CM | POA: Diagnosis not present

## 2023-03-28 DIAGNOSIS — S32409A Unspecified fracture of unspecified acetabulum, initial encounter for closed fracture: Secondary | ICD-10-CM | POA: Diagnosis present

## 2023-03-28 DIAGNOSIS — W19XXXA Unspecified fall, initial encounter: Secondary | ICD-10-CM | POA: Diagnosis not present

## 2023-03-28 DIAGNOSIS — Z9842 Cataract extraction status, left eye: Secondary | ICD-10-CM

## 2023-03-28 DIAGNOSIS — G4733 Obstructive sleep apnea (adult) (pediatric): Secondary | ICD-10-CM | POA: Diagnosis not present

## 2023-03-28 DIAGNOSIS — I480 Paroxysmal atrial fibrillation: Secondary | ICD-10-CM | POA: Diagnosis not present

## 2023-03-28 DIAGNOSIS — Z888 Allergy status to other drugs, medicaments and biological substances status: Secondary | ICD-10-CM | POA: Diagnosis not present

## 2023-03-28 DIAGNOSIS — Z87891 Personal history of nicotine dependence: Secondary | ICD-10-CM

## 2023-03-28 DIAGNOSIS — F0280 Dementia in other diseases classified elsewhere without behavioral disturbance: Secondary | ICD-10-CM | POA: Diagnosis present

## 2023-03-28 DIAGNOSIS — M25572 Pain in left ankle and joints of left foot: Secondary | ICD-10-CM | POA: Diagnosis not present

## 2023-03-28 DIAGNOSIS — D649 Anemia, unspecified: Secondary | ICD-10-CM | POA: Diagnosis not present

## 2023-03-28 DIAGNOSIS — M800B1A Age-related osteoporosis with current pathological fracture, right pelvis, initial encounter for fracture: Secondary | ICD-10-CM | POA: Diagnosis not present

## 2023-03-28 DIAGNOSIS — Z955 Presence of coronary angioplasty implant and graft: Secondary | ICD-10-CM | POA: Diagnosis not present

## 2023-03-28 DIAGNOSIS — M800AXA Age-related osteoporosis with current pathological fracture, other site, initial encounter for fracture: Secondary | ICD-10-CM | POA: Diagnosis present

## 2023-03-28 DIAGNOSIS — S0990XA Unspecified injury of head, initial encounter: Secondary | ICD-10-CM | POA: Diagnosis not present

## 2023-03-28 DIAGNOSIS — G308 Other Alzheimer's disease: Secondary | ICD-10-CM | POA: Diagnosis not present

## 2023-03-28 DIAGNOSIS — F02B3 Dementia in other diseases classified elsewhere, moderate, with mood disturbance: Secondary | ICD-10-CM | POA: Diagnosis not present

## 2023-03-28 DIAGNOSIS — S32512A Fracture of superior rim of left pubis, initial encounter for closed fracture: Secondary | ICD-10-CM | POA: Diagnosis not present

## 2023-03-28 DIAGNOSIS — Z8249 Family history of ischemic heart disease and other diseases of the circulatory system: Secondary | ICD-10-CM

## 2023-03-28 DIAGNOSIS — S32401K Unspecified fracture of right acetabulum, subsequent encounter for fracture with nonunion: Secondary | ICD-10-CM | POA: Diagnosis not present

## 2023-03-28 DIAGNOSIS — E860 Dehydration: Secondary | ICD-10-CM | POA: Diagnosis present

## 2023-03-28 DIAGNOSIS — S32599A Other specified fracture of unspecified pubis, initial encounter for closed fracture: Secondary | ICD-10-CM | POA: Diagnosis present

## 2023-03-28 DIAGNOSIS — S32481A Displaced dome fracture of right acetabulum, initial encounter for closed fracture: Secondary | ICD-10-CM | POA: Diagnosis not present

## 2023-03-28 DIAGNOSIS — I10 Essential (primary) hypertension: Secondary | ICD-10-CM | POA: Diagnosis present

## 2023-03-28 DIAGNOSIS — S32411A Displaced fracture of anterior wall of right acetabulum, initial encounter for closed fracture: Secondary | ICD-10-CM | POA: Diagnosis not present

## 2023-03-28 DIAGNOSIS — Z79899 Other long term (current) drug therapy: Secondary | ICD-10-CM

## 2023-03-28 DIAGNOSIS — R531 Weakness: Secondary | ICD-10-CM | POA: Diagnosis not present

## 2023-03-28 DIAGNOSIS — Z0389 Encounter for observation for other suspected diseases and conditions ruled out: Secondary | ICD-10-CM | POA: Diagnosis not present

## 2023-03-28 DIAGNOSIS — R404 Transient alteration of awareness: Secondary | ICD-10-CM | POA: Diagnosis not present

## 2023-03-28 LAB — URINALYSIS, ROUTINE W REFLEX MICROSCOPIC
Bilirubin Urine: NEGATIVE
Glucose, UA: NEGATIVE mg/dL
Hgb urine dipstick: NEGATIVE
Ketones, ur: NEGATIVE mg/dL
Leukocytes,Ua: NEGATIVE
Nitrite: NEGATIVE
Protein, ur: NEGATIVE mg/dL
Specific Gravity, Urine: 1.012 (ref 1.005–1.030)
pH: 6 (ref 5.0–8.0)

## 2023-03-28 LAB — COMPREHENSIVE METABOLIC PANEL
ALT: 16 U/L (ref 0–44)
AST: 17 U/L (ref 15–41)
Albumin: 3.6 g/dL (ref 3.5–5.0)
Alkaline Phosphatase: 56 U/L (ref 38–126)
Anion gap: 8 (ref 5–15)
BUN: 16 mg/dL (ref 8–23)
CO2: 24 mmol/L (ref 22–32)
Calcium: 8.5 mg/dL — ABNORMAL LOW (ref 8.9–10.3)
Chloride: 105 mmol/L (ref 98–111)
Creatinine, Ser: 1 mg/dL (ref 0.61–1.24)
GFR, Estimated: >60 mL/min (ref >60)
Glucose, Bld: 105 mg/dL — ABNORMAL HIGH (ref 70–99)
Potassium: 3.9 mmol/L (ref 3.5–5.1)
Sodium: 137 mmol/L (ref 135–145)
Total Bilirubin: 0.6 mg/dL (ref 0.3–1.2)
Total Protein: 6.8 g/dL (ref 6.5–8.1)

## 2023-03-28 LAB — CBC
HCT: 34.5 % — ABNORMAL LOW (ref 39.0–52.0)
Hemoglobin: 11.3 g/dL — ABNORMAL LOW (ref 13.0–17.0)
MCH: 31.6 pg (ref 26.0–34.0)
MCHC: 32.8 g/dL (ref 30.0–36.0)
MCV: 96.4 fL (ref 80.0–100.0)
Platelets: 270 10*3/uL (ref 150–400)
RBC: 3.58 MIL/uL — ABNORMAL LOW (ref 4.22–5.81)
RDW: 15.5 % (ref 11.5–15.5)
WBC: 7.5 10*3/uL (ref 4.0–10.5)
nRBC: 0 % (ref 0.0–0.2)

## 2023-03-28 LAB — PROTIME-INR
INR: 1.1 (ref 0.8–1.2)
Prothrombin Time: 14.4 seconds (ref 11.4–15.2)

## 2023-03-28 LAB — SURGICAL PCR SCREEN
MRSA, PCR: NEGATIVE
Staphylococcus aureus: NEGATIVE

## 2023-03-28 MED ORDER — DOCUSATE SODIUM 100 MG PO CAPS
100.0000 mg | ORAL_CAPSULE | Freq: Two times a day (BID) | ORAL | Status: DC
  Administered 2023-03-28 – 2023-04-01 (×8): 100 mg via ORAL
  Filled 2023-03-28 (×8): qty 1

## 2023-03-28 MED ORDER — DONEPEZIL HCL 5 MG PO TABS
5.0000 mg | ORAL_TABLET | Freq: Every evening | ORAL | Status: DC
  Administered 2023-03-28 – 2023-03-31 (×4): 5 mg via ORAL
  Filled 2023-03-28 (×4): qty 1

## 2023-03-28 MED ORDER — OXYCODONE HCL 5 MG PO TABS
5.0000 mg | ORAL_TABLET | ORAL | Status: DC | PRN
  Administered 2023-03-28 – 2023-04-01 (×7): 10 mg via ORAL
  Filled 2023-03-28 (×8): qty 2

## 2023-03-28 MED ORDER — MELATONIN 5 MG PO TABS
5.0000 mg | ORAL_TABLET | Freq: Every day | ORAL | Status: DC
  Administered 2023-03-28 – 2023-03-31 (×4): 5 mg via ORAL
  Filled 2023-03-28 (×4): qty 1

## 2023-03-28 MED ORDER — POLYETHYLENE GLYCOL 3350 17 G PO PACK
17.0000 g | PACK | Freq: Every day | ORAL | Status: DC | PRN
  Administered 2023-03-31: 17 g via ORAL
  Filled 2023-03-28: qty 1

## 2023-03-28 MED ORDER — ONDANSETRON HCL 4 MG/2ML IJ SOLN: 4.0000 mg | Freq: Four times a day (QID) | INTRAMUSCULAR | Status: DC | PRN

## 2023-03-28 MED ORDER — METOPROLOL TARTRATE 12.5 MG HALF TABLET
12.5000 mg | ORAL_TABLET | Freq: Two times a day (BID) | ORAL | Status: DC
  Administered 2023-03-28 – 2023-04-01 (×9): 12.5 mg via ORAL
  Filled 2023-03-28 (×9): qty 1

## 2023-03-28 MED ORDER — SODIUM CHLORIDE 0.9 % IV SOLN: INTRAVENOUS | Status: DC

## 2023-03-28 MED ORDER — DIVALPROEX SODIUM 125 MG PO CSDR
125.0000 mg | DELAYED_RELEASE_CAPSULE | Freq: Every day | ORAL | Status: AC
  Administered 2023-03-28 – 2023-03-30 (×2): 125 mg via ORAL
  Filled 2023-03-28 (×2): qty 1

## 2023-03-28 MED ORDER — ROSUVASTATIN CALCIUM 5 MG PO TABS
5.0000 mg | ORAL_TABLET | Freq: Every morning | ORAL | Status: DC
  Administered 2023-03-28 – 2023-04-01 (×4): 5 mg via ORAL
  Filled 2023-03-28 (×4): qty 1

## 2023-03-28 MED ORDER — FENTANYL CITRATE PF 50 MCG/ML IJ SOSY
50.0000 ug | PREFILLED_SYRINGE | INTRAMUSCULAR | Status: DC | PRN
  Administered 2023-03-28: 50 ug via INTRAVENOUS
  Filled 2023-03-28: qty 1

## 2023-03-28 MED ORDER — PANTOPRAZOLE SODIUM 40 MG PO TBEC
40.0000 mg | DELAYED_RELEASE_TABLET | Freq: Every day | ORAL | Status: DC
  Administered 2023-03-28 – 2023-04-01 (×4): 40 mg via ORAL
  Filled 2023-03-28 (×4): qty 1

## 2023-03-28 MED ORDER — QUETIAPINE FUMARATE 25 MG PO TABS
25.0000 mg | ORAL_TABLET | Freq: Two times a day (BID) | ORAL | Status: DC
  Administered 2023-03-28 – 2023-04-01 (×8): 25 mg via ORAL
  Filled 2023-03-28 (×8): qty 1

## 2023-03-28 MED ORDER — MUPIROCIN 2 % EX OINT
1.0000 | TOPICAL_OINTMENT | Freq: Two times a day (BID) | CUTANEOUS | Status: DC
  Administered 2023-03-28 – 2023-04-01 (×7): 1 via NASAL
  Filled 2023-03-28 (×3): qty 22

## 2023-03-28 MED ORDER — HALOPERIDOL LACTATE 5 MG/ML IJ SOLN: 2.0000 mg | Freq: Four times a day (QID) | INTRAMUSCULAR | Status: DC | PRN

## 2023-03-28 MED ORDER — ACETAMINOPHEN 325 MG PO TABS: 650.0000 mg | ORAL_TABLET | Freq: Four times a day (QID) | ORAL | Status: DC | PRN

## 2023-03-28 MED ORDER — ESCITALOPRAM OXALATE 10 MG PO TABS
20.0000 mg | ORAL_TABLET | Freq: Every day | ORAL | Status: DC
  Administered 2023-03-28 – 2023-04-01 (×4): 20 mg via ORAL
  Filled 2023-03-28 (×4): qty 2

## 2023-03-28 MED ORDER — GABAPENTIN 300 MG PO CAPS
300.0000 mg | ORAL_CAPSULE | Freq: Three times a day (TID) | ORAL | Status: DC
  Administered 2023-03-28 – 2023-04-01 (×12): 300 mg via ORAL
  Filled 2023-03-28 (×12): qty 1

## 2023-03-28 MED ORDER — METHOCARBAMOL 500 MG PO TABS
500.0000 mg | ORAL_TABLET | Freq: Four times a day (QID) | ORAL | Status: DC | PRN
  Administered 2023-03-29 – 2023-03-31 (×3): 500 mg via ORAL
  Filled 2023-03-28 (×3): qty 1

## 2023-03-28 MED ORDER — MORPHINE SULFATE (PF) 2 MG/ML IV SOLN
2.0000 mg | INTRAVENOUS | Status: DC | PRN
  Administered 2023-03-28 – 2023-03-31 (×5): 2 mg via INTRAVENOUS
  Filled 2023-03-28 (×5): qty 1

## 2023-03-28 MED ORDER — ONDANSETRON HCL 4 MG/2ML IJ SOLN
4.0000 mg | Freq: Once | INTRAMUSCULAR | Status: AC
  Administered 2023-03-28: 4 mg via INTRAVENOUS
  Filled 2023-03-28: qty 2

## 2023-03-28 MED ORDER — BISACODYL 5 MG PO TBEC: 5.0000 mg | DELAYED_RELEASE_TABLET | Freq: Every day | ORAL | Status: DC | PRN

## 2023-03-28 MED ORDER — METHOCARBAMOL 1000 MG/10ML IJ SOLN: 500.0000 mg | Freq: Four times a day (QID) | INTRAVENOUS | Status: DC | PRN

## 2023-03-28 NOTE — ED Provider Notes (Addendum)
[facility]ERGENCY DEPARTMENT AT [facility] Provider Note   CSN: [id number] Arrival date & time: 03/28/23  4098     History  Chief Complaint  Patient presents with   [name] is a 89 y.o. male.  Patient is an 89 year old male with a history of hypertension, paroxysmal atrial fibrillation on Eliquis, CAD, RA who lives at [facility] and reports a fall in the middle of the night.  Patient reports he was on the side of the bed and getting up to go to the bathroom but got tangled and fell.  He says he went sideways and upside down and did not think it was a bad fall but did hit his head pretty hard on the floor and hit his right hip.  He states the pain in his hip is unbearable he was unable to stand or walk.  He laid on the floor for some time screaming until someone could find him.  He denies any headache or neck pain.  His only complaint is of right hip pain.  No numbness or tingling in his foot.  He is compliant with his Eliquis.  He denies any chest pain or abdominal pain.  The history is provided by the patient.  Fall       Home Medications Prior to Admission medications   Medication Sig Start Date End Date Taking? Authorizing Provider  acetaminophen (TYLENOL) 500 MG tablet Take 1,000 mg by mouth 2 (two) times daily.   Yes [provider]  apixaban (ELIQUIS) 5 MG TABS tablet Take 1 tablet (5 mg total) by mouth 2 (two) times daily. 11/30/22  Yes [name], MD  divalproex (DEPAKOTE SPRINKLE) 125 MG capsule Take 125 mg by mouth daily. Give 125 mg by mouth once daily for one week then DC on 03/30/23   Yes [provider]  donepezil (ARICEPT) 5 MG tablet TAKE ONE TABLET BY MOUTH EVERY MORNING Patient taking differently: Take 5 mg by mouth every evening. 11/07/22  Yes Plotnikov, Georgina Quint, MD  escitalopram (LEXAPRO) 20 MG tablet Take 20 mg by mouth daily.   Yes [provider]  Evolocumab (REPATHA SURECLICK) 140 MG/ML SOAJ INJECT  1 PEN INTO SKIN EVERY 14 DAYS Patient taking differently: Inject 140 mg into the skin every 14 (fourteen) days. 03/30/21  Yes Turner, Cornelious Bryant, MD  ezetimibe (ZETIA) 10 MG tablet Take 5 mg by mouth daily.   Yes [provider]  gabapentin (NEURONTIN) 300 MG capsule Take 1 capsule (300 mg total) by mouth 3 (three) times daily. 10/05/22  Yes Trixie Deis R, PA-C  LORazepam (ATIVAN) 0.5 MG tablet Take 0.5 mg by mouth 2 (two) times daily as needed (behaviors). For 14 days starting 03/25/23   Yes [provider]  melatonin 5 MG TABS Take 5 mg by mouth at bedtime.   Yes [provider]  metoprolol tartrate (LOPRESSOR) 25 MG tablet Take 25 mg by mouth at bedtime.   Yes [provider]  nitroGLYCERIN (NITROSTAT) 0.4 MG SL tablet Place 0.4 mg under the tongue every 5 (five) minutes as needed for chest pain.   Yes [provider]  omeprazole (PRILOSEC) 40 MG capsule Take 40 mg by mouth in the morning. Take on empty stomach 30 minutes before breakfast.   Yes [provider]  QUEtiapine (SEROQUEL) 25 MG tablet Take 25 mg by mouth 2 (two) times daily.   Yes [provider]  rosuvastatin (CRESTOR) 5 MG tablet Take  5 mg by mouth every morning.   Yes [provider]  triamcinolone cream (KENALOG) 0.1 % Apply 1 Application topically 2 (two) times daily. Apply to arms, back, and legs for rash for 2 weeks starting 03/17/23 03/17/23 03/31/23 Yes [provider]  triamcinolone cream (KENALOG) 0.1 % Apply 1 Application topically as needed (rash on chest and back).   Yes [provider]  hydrOXYzine (VISTARIL) 25 MG capsule Take 1 capsule (25 mg total) by mouth every 8 (eight) hours as needed. Patient not taking: Reported on 03/28/2023 02/25/23   Fletcher Anon, NP  apixaban (ELIQUIS) 2.5 MG TABS tablet Take 1 tablet (2.5 mg total) by mouth 2 (two) times daily. 10/18/22   Fletcher Anon, NP      Allergies    Methotrexate, Aricept  Palma Holter hcl], Crestor [rosuvastatin calcium], Lisinopril, Namenda [memantine], Atorvastatin, and Pravastatin sodium    Review of Systems   Review of Systems  Physical Exam Updated Vital Signs BP 123/83   Pulse 66   Temp 98.1 F (36.7 C) (Oral)   Resp 14   Ht 6' (1.829 m)   Wt 87.7 kg   SpO2 90%   BMI 26.22 kg/m  Physical Exam Vitals and nursing note reviewed.  Constitutional:      General: He is not in acute distress.    Appearance: He is well-developed.  HENT:     Head: Normocephalic and atraumatic.     Comments: No notable hematomas to the back of the head, Eyes:     Conjunctiva/sclera: Conjunctivae normal.     Pupils: Pupils are equal, round, and reactive to light.  Cardiovascular:     Rate and Rhythm: Normal rate and regular rhythm.     Heart sounds: No murmur heard. Pulmonary:     Effort: Pulmonary effort is normal. No respiratory distress.     Breath sounds: Normal breath sounds. No wheezing or rales.  Abdominal:     General: There is no distension.     Palpations: Abdomen is soft.     Tenderness: There is no abdominal tenderness. There is no guarding or rebound.  Musculoskeletal:        General: Tenderness present.     Cervical back: Normal range of motion and neck supple. No tenderness. No spinous process tenderness or muscular tenderness.     Right hip: Tenderness and bony tenderness present. No deformity. Decreased range of motion.     Left hip: Normal.     Right knee: Normal.     Left knee: Normal.     Right ankle: Normal.     Left ankle: Normal.  Skin:    General: Skin is warm and dry.     Findings: No erythema or rash.  Neurological:     Mental Status: He is alert and oriented to person, place, and time. Mental status is at baseline.  Psychiatric:        Behavior: Behavior normal.     ED Results / Procedures / Treatments   Labs (all labs ordered are listed, but only abnormal results are displayed) Labs Reviewed  COMPREHENSIVE METABOLIC  PANEL - Abnormal; Notable for the following components:      Result Value   Glucose, Bld 105 (*)    Calcium 8.5 (*)    All other components within normal limits  CBC - Abnormal; Notable for the following components:   RBC 3.58 (*)    Hemoglobin 11.3 (*)    HCT 34.5 (*)    All other  components within normal limits  URINALYSIS, ROUTINE W REFLEX MICROSCOPIC  PROTIME-INR  TYPE AND SCREEN    EKG EKG Interpretation  Date/Time:  Monday Mar 28 2023 07:03:03 EDT Ventricular Rate:  67 PR Interval:  175 QRS Duration: 108 QT Interval:  436 QTC Calculation: 461 R Axis:   -8 Text Interpretation: Sinus rhythm No significant change since last tracing Confirmed by Gwyneth Sprout ([id number]) on 03/28/2023 7:28:26 AM  Radiology CT PELVIS WO CONTRAST  Result Date: 03/28/2023 CLINICAL DATA:  Hip fracture. EXAM: CT PELVIS WITHOUT CONTRAST TECHNIQUE: Multidetector CT imaging of the pelvis was performed following the standard protocol without intravenous contrast. RADIATION DOSE REDUCTION: This exam was performed according to the departmental dose-optimization program which includes automated exposure control, adjustment of the mA and/or kV according to patient size and/or use of iterative reconstruction technique. COMPARISON:  X-ray earlier 03/28/2023.  CT scan 10/02/2022 FINDINGS: Urinary Tract: Enlarged prostate with mass effect along the base of the bladder. Bladder wall is slightly thickened and trabeculated. Bowel: Few sigmoid colon diverticula identified. Scattered colonic stool. The visualized bowel in the pelvis is nondilated. Normal appendix. Vascular/Lymphatic: Diffuse vascular calcifications identified along the iliac vessels and distal aorta. No discrete abnormal lymph node enlargement identified in the visualized pelvis. Reproductive: Enlarged prostate with mass effect along the base of the bladder. Other: Extraperitoneal hematoma identified along the right hemipelvis. There is also nonspecific  stranding in the presacral space Musculoskeletal: Comminuted fracture involving the right acetabulum superior and medial displacement. Anterior and posterior columns of the acetabulum are involved with the fracture as well. Additional area of injury involves the inferior pubic ramus. This is in 2 areas, midportion and towards the pubic symphysis. No additional areas of fracture or dislocation. Mild degenerative changes seen of the sacroiliac joints, left hip joint. Mild hypertrophic changes of the pubic symphysis. Degenerative changes of the visualized lumbar spine with surgical material along the disc spaces at L4-5 and L5-S1. IMPRESSION: Comminuted displaced fracture of the right acetabulum. This involves all portions of the acetabular including medial as well as the anterior and posterior columns. Separate fracture of the inferior pubic ramus in 2 areas with 1 extending towards the symphysis. Extraperitoneal hematoma along the right hemipelvis. Electronically Signed   By: Karen Kays M.D.   On: 03/28/2023 09:58   DG Chest 1 View  Result Date: 03/28/2023 CLINICAL DATA:  Right hip fracture EXAM: CHEST  1 VIEW COMPARISON:  Previous studies including the examination of 12/26/2022 FINDINGS: Transient diameter of heart is slightly increased. Thoracic aorta is ectatic. Lung fields are clear of any infiltrates or pulmonary edema. There is no pleural effusion or pneumothorax. Deformities are noted in posterior left fourth, fifth and sixth ribs suggesting old healed fractures. There is previous surgical fusion in cervical spine. IMPRESSION: There are no signs of pulmonary edema or focal pulmonary consolidation. Electronically Signed   By: Ernie Avena M.D.   On: 03/28/2023 08:55   DG Hip Unilat  With Pelvis 2-3 Views Right  Result Date: 03/28/2023 CLINICAL DATA:  Trauma, fall.  Painful to bear weight. EXAM: DG HIP (WITH OR WITHOUT PELVIS) 2-3V RIGHT COMPARISON:  Radiographs dated October 02, 2022 FINDINGS:  There is cortical irregularity of the acetabular floor and right pubic rami consistent with pelvic fracture. Mild bilateral hip osteoarthritis. Sacroiliac joints and pubic symphysis appear intact. IMPRESSION: Cortical irregularity of the acetabular floor and right pubic rami consistent with pelvic fracture. CT examination for further evaluation is suggested. Electronically Signed   By: Leona Carry  Ahmed D.O.   On: 03/28/2023 08:48   CT HEAD WO CONTRAST  Result Date: 03/28/2023 CLINICAL DATA:  Trauma EXAM: CT HEAD WITHOUT CONTRAST TECHNIQUE: Contiguous axial images were obtained from the base of the skull through the vertex without intravenous contrast. RADIATION DOSE REDUCTION: This exam was performed according to the departmental dose-optimization program which includes automated exposure control, adjustment of the mA and/or kV according to patient size and/or use of iterative reconstruction technique. COMPARISON:  10/02/2022 FINDINGS: Brain: No acute intracranial findings are seen. There are no signs of bleeding within the cranium. There is interval resolution of small left subdural hematoma. There is prominence of the third and both lateral ventricles. Cortical sulci are prominent. There is decreased density in periventricular and subcortical white matter. Possible small old lacunar infarcts are seen in basal ganglia. There is low-density in the anterior aspect of pons which may be an artifact or old lacunar infarct. Vascular: Scattered arterial calcifications are seen. Skull: No fracture is seen in calvarium. Sinuses/Orbits: There is mucosal thickening in ethmoid, sphenoid and maxillary sinuses. Other: There is increased amount of CSF in the sella suggesting partial empty sella. IMPRESSION: No acute intracranial findings are seen. There are no signs of bleeding within the cranium. There is no focal mass effect. Atrophy. Small vessel disease. Possible small old lacunar infarct in pons and basal ganglia. Chronic  sinusitis. Electronically Signed   By: Ernie Avena M.D.   On: 03/28/2023 08:20    Procedures Procedures    Medications Ordered in ED Medications  0.9 %  sodium chloride infusion ( Intravenous New Bag/Given 03/28/23 0816)  fentaNYL (SUBLIMAZE) injection 50 mcg (50 mcg Intravenous Given 03/28/23 0818)  ondansetron (ZOFRAN) injection 4 mg (4 mg Intravenous Given 03/28/23 0816)    ED Course/ Medical Decision Making/ A&P                             Medical Decision Making Amount and/or Complexity of Data Reviewed Independent Historian: EMS External Data Reviewed: notes. Labs: ordered. Decision-making details documented in ED Course. Radiology: ordered and independent interpretation performed. Decision-making details documented in ED Course. ECG/medicine tests: ordered and independent interpretation performed. Decision-making details documented in ED Course.  Risk Prescription drug management. Parenteral controlled substances. Decision regarding hospitalization.   Pt with multiple medical problems and comorbidities and presenting today with a complaint that caries a high risk for morbidity and mortality.  Here today after a fall at home which he describes as tripping and losing his balance.  Patient denies loss of consciousness but does report head injury and is on Eliquis.  Biggest complaint is of right hip pain.  On exam unable to range the hip due to pain and concern for fracture.  Lower suspicion for head bleed.  Low suspicion for dysrhythmia, PE, cardiac cause or syncope as a cause for the fall.  Patient received pain medication and route and currently appears comfortable.  Hip fracture protocol was initiated.  10:08 AM I independently interpreted patient's labs and EKG.  CBC, CMP without acute findings, coags within normal limits.  EKG without acute changes today.  I have independently visualized and interpreted pt's images today.  Hip image today with concern for acetabular  fracture.  Femur appears intact.  Chest x-ray without acute findings and head CT without signs of bleed.  Radiology did recommend CT of the pelvis for further investigation which was done.  Findings were discussed with the patient.  As long as he is not moving his pain is controlled but he still has significant pain when moving in the bed.  10:08 AM Charma Igo with ortho will come and see the pt for recommendation on activity and management.  Will need admission.  Findings discussed with the patient.  He is comfortable with this plan.          Final Clinical Impression(s) / ED Diagnoses Final diagnoses:  Fall, initial encounter  Closed displaced fracture of right acetabulum, unspecified portion of acetabulum, initial encounter (HCC)  Closed fracture of multiple rami of left pubis, initial encounter [facility])    Rx / DC Orders ED Discharge Orders     None         Gwyneth Sprout, MD 03/28/23 1008    Gwyneth Sprout, MD 03/28/23 1009

## 2023-03-28 NOTE — H&P (Signed)
History and Physical    Patient: [name]:[id number] DOB: 1933/06/17 DOA: 03/28/2023 DOS: the patient was seen and examined on 03/28/2023 PCP: [name], MD  Patient coming from: ALF/ILF - [facility]; NOK: Wife, [phone]   Chief Complaint: Fall  HPI: [name] is a 89 y.o. male with medical history significant of CAD, HTN, HLD, OSA on CPAP, afib  on Eliquis, dementia, and RA presenting with a fall. He fell out of the bed last night.  He says that he sat on the edge of the bed and fell out.  He is a little confused about it all.  He is having pain, thinks "they operated on me all day today."    ER Course:  Fall, mechanical.  Acetabular fall.  Pain controlled at rest.  Head CT negative for bleed on AC.     Review of Systems: unable to review all systems due to the inability of the patient to answer questions.  He is quite confused.  Past Medical History:  Diagnosis Date   CAD (coronary artery disease)    a. s/p inferior STEMI 01/08/2014; LHC 01/08/14: total RCA occlusion s/p 3.5x29mm Xience DES distal RCA and 3.5x28 mm DES mid RCA, 60-70% mid LAD stenosis, EF 55%.   Complication of anesthesia    'long to wake up after back surgery " 09/2003   Diverticulosis of colon    GERD (gastroesophageal reflux disease)    History of cellulitis    05-26-2015  LLE   History of colon polyps    1998- benign/  2008 adenomatous    History of kidney stones    2013   History of squamous cell carcinoma excision    2013;  2015;  06-12-2015 right leg/  02/ and 05/ 2017  left ear and left leg   Hydronephrosis, left    Hyperlipidemia    Hypertension    Migraine with aura    OSA on CPAP 06/19/2015   Moderate OSA with AHI 18/hr  per study 05-20-2015   Osteoarthritis    Paroxysmal atrial fibrillation (HCC) 4/16   chads2vasc score is at least 4   Premature atrial contractions    RA (rheumatoid arthritis) [facility])    rheumatologist-  dr Alben Deeds   Sinusitis, chronic 10/17/2015    Wears glasses    Wears hearing aid    bilateral   Past Surgical History:  Procedure Laterality Date   BACK SURGERY     CARDIOVASCULAR STRESS TEST  11/21/2015   Low risk nuclear study w/ a small diaphragmatic attenuation artifact, no ischemia/  normal LV function and wall motion , ef 63%   CATARACT EXTRACTION W/ INTRAOCULAR LENS  IMPLANT, BILATERAL  2015   COLONOSCOPY  last one 06-09-2011   CORONARY ANGIOPLASTY     CYSTOSCOPY W/ RETROGRADES Left 07/12/2016   Procedure: CYSTOSCOPY WITH RETROGRADE PYELOGRAM LEFT URETERAL STENT;  Surgeon: Bjorn Pippin, MD;  Location: [facility];  Service: Urology;  Laterality: Left;   CYSTOSCOPY WITH RETROGRADE PYELOGRAM, URETEROSCOPY AND STENT PLACEMENT Left 08/11/2016   Procedure: CYSTOSCOPY WITH RETROGRADE PYELOGRAM,  DIAGNOSTIC URETEROSCOPY , STENT EXCHANGE;  Surgeon: Sebastian Ache, MD;  Location: [facility];  Service: Urology;  Laterality: Left;   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   severe fibromatosis palm and fingers   LEFT HEART CATHETERIZATION WITH CORONARY ANGIOGRAM N/A 01/08/2014   Procedure: LEFT HEART CATHETERIZATION WITH CORONARY ANGIOGRAM;  Surgeon: Lennette Bihari, MD;  Location: [facility] CATH LAB;  Service: Cardiovascular;  Laterality:N/A;  total/ subtotal RCA/  mLAD 60-70% w/ mid systolic bridging/  preserved global LVF, ef 55%   MOHS SURGERY  x2  feb and may 2017   left ear /  left leg  (SCC)   PERCUTANEOUS CORONARY STENT INTERVENTION (PCI-S)  01/08/2014   Procedure: PERCUTANEOUS CORONARY STENT INTERVENTION (PCI-S);  Surgeon: Lennette Bihari, MD;  Location: [facility] CATH LAB;  Service: Cardiovascular;;  DES to mid and distal RCA   POSTERIOR LUMBAR FUSION  10/01/2003   and Laminectomy/ diskectomy  L4 -- S1   ROBOT ASSISTED PYELOPLASTY Left 09/15/2016   Procedure: XI ROBOTIC ASSISTED PYELOPLASTY;  Surgeon: Sebastian Ache, MD;  Location: [facility];  Service: Urology;  Laterality: Left;   TONSILLECTOMY     TRANSTHORACIC ECHOCARDIOGRAM   02/23/2016   mild LVH,  ef 50-55%,  grade 1 diastolic dysfunction/  mild to moderate AV calcification w/ no stenosis or regurg./  trivial MR and TR/  mild PR   Social History:  reports that he quit smoking about 54 years ago. His smoking use included cigarettes. He has never used smokeless tobacco. He reports that he does not currently use alcohol after a past usage of about 2.0 standard drinks of alcohol per week. He reports that he does not use drugs.  Allergies  Allergen Reactions   Methotrexate Other (See Comments)    REACTION: tachycardia   Aricept [Donepezil Hcl] Other (See Comments)    Nightmares. Pt takes this medication per [facility]    Crestor [Rosuvastatin Calcium] Other (See Comments)    Myalgias with 20mg  dose   Lisinopril Other (See Comments)    cough   Namenda [Memantine] Diarrhea and Nausea Only   Atorvastatin Other (See Comments)    Muscle pain, leg cramps   Pravastatin Sodium Other (See Comments)    REACTION: cramps, fatigue    Family History  Problem Relation Age of Onset   Hypertension Mother    Heart disease Father    Colon cancer Neg Hx     Prior to Admission medications   Medication Sig Start Date End Date Taking? Authorizing Provider  acetaminophen (TYLENOL) 500 MG tablet Take 1,000 mg by mouth 2 (two) times daily.   Yes [provider]  apixaban (ELIQUIS) 5 MG TABS tablet Take 1 tablet (5 mg total) by mouth 2 (two) times daily. 11/30/22  Yes [name], MD  divalproex (DEPAKOTE SPRINKLE) 125 MG capsule Take 125 mg by mouth daily. Give 125 mg by mouth once daily for one week then DC on 03/30/23   Yes [provider]  donepezil (ARICEPT) 5 MG tablet TAKE ONE TABLET BY MOUTH EVERY MORNING Patient taking differently: Take 5 mg by mouth every evening. 11/07/22  Yes Plotnikov, Georgina Quint, MD  escitalopram (LEXAPRO) 20 MG tablet Take 20 mg by mouth daily.   Yes [provider]  Evolocumab (REPATHA SURECLICK) 140 MG/ML SOAJ INJECT 1 PEN  INTO SKIN EVERY 14 DAYS Patient taking differently: Inject 140 mg into the skin every 14 (fourteen) days. 03/30/21  Yes Turner, Cornelious Bryant, MD  ezetimibe (ZETIA) 10 MG tablet Take 5 mg by mouth daily.   Yes [provider]  gabapentin (NEURONTIN) 300 MG capsule Take 1 capsule (300 mg total) by mouth 3 (three) times daily. 10/05/22  Yes Trixie Deis R, PA-C  LORazepam (ATIVAN) 0.5 MG tablet Take 0.5 mg by mouth 2 (two) times daily as needed (behaviors). For 14 days starting 03/25/23   Yes [provider]  melatonin 5  MG TABS Take 5 mg by mouth at bedtime.   Yes [provider]  metoprolol tartrate (LOPRESSOR) 25 MG tablet Take 25 mg by mouth at bedtime.   Yes [provider]  nitroGLYCERIN (NITROSTAT) 0.4 MG SL tablet Place 0.4 mg under the tongue every 5 (five) minutes as needed for chest pain.   Yes [provider]  omeprazole (PRILOSEC) 40 MG capsule Take 40 mg by mouth in the morning. Take on empty stomach 30 minutes before breakfast.   Yes [provider]  QUEtiapine (SEROQUEL) 25 MG tablet Take 25 mg by mouth 2 (two) times daily.   Yes [provider]  rosuvastatin (CRESTOR) 5 MG tablet Take 5 mg by mouth every morning.   Yes [provider]  triamcinolone cream (KENALOG) 0.1 % Apply 1 Application topically 2 (two) times daily. Apply to arms, back, and legs for rash for 2 weeks starting 03/17/23 03/17/23 03/31/23 Yes [provider]  triamcinolone cream (KENALOG) 0.1 % Apply 1 Application topically as needed (rash on chest and back).   Yes [provider]  hydrOXYzine (VISTARIL) 25 MG capsule Take 1 capsule (25 mg total) by mouth every 8 (eight) hours as needed. Patient not taking: Reported on 03/28/2023 02/25/23   Fletcher Anon, NP  apixaban (ELIQUIS) 2.5 MG TABS tablet Take 1 tablet (2.5 mg total) by mouth 2 (two) times daily. 10/18/22   Fletcher Anon, NP    Physical Exam: Vitals:   03/28/23 0900 03/28/23  1100 03/28/23 1115 03/28/23 1134  BP: 123/83  (!) 132/98 (!) 155/101  Pulse: 66 61 65 64  Resp: 14 14 (!) 24   Temp:    97.9 F (36.6 C)  TempSrc:    Oral  SpO2: 94% 100% 99% 98%  Weight:      Height:       General:  Appears calm and comfortable and is in NAD, pain with movement of R hip Eyes:  EOMI, normal lids, iris ENT:  grossly normal hearing, lips & tongue, mmm; appropriate dentition Neck:  no LAD, masses or thyromegaly Cardiovascular:  RRR, no m/r/g. No LE edema.  Respiratory:   CTA bilaterally with no wheezes/rales/rhonchi.  Normal respiratory effort. Abdomen:  soft, NT, ND Skin:  no rash or induration seen on limited exam Musculoskeletal:  no frank R hip deformity but significant pain with limited movement, able to wiggle toes Psychiatric:  confused but pleasant mood and affect, speech fluent and appropriate, AOx1 Neurologic:  CN 2-12 grossly intact, moves all extremities in coordinated fashion other than RLE   Radiological Exams on Admission: Independently reviewed - see discussion in A/P where applicable  CT PELVIS WO CONTRAST  Result Date: 03/28/2023 CLINICAL DATA:  Hip fracture. EXAM: CT PELVIS WITHOUT CONTRAST TECHNIQUE: Multidetector CT imaging of the pelvis was performed following the standard protocol without intravenous contrast. RADIATION DOSE REDUCTION: This exam was performed according to the departmental dose-optimization program which includes automated exposure control, adjustment of the mA and/or kV according to patient size and/or use of iterative reconstruction technique. COMPARISON:  X-ray earlier 03/28/2023.  CT scan 10/02/2022 FINDINGS: Urinary Tract: Enlarged prostate with mass effect along the base of the bladder. Bladder wall is slightly thickened and trabeculated. Bowel: Few sigmoid colon diverticula identified. Scattered colonic stool. The visualized bowel in the pelvis is nondilated. Normal appendix. Vascular/Lymphatic: Diffuse vascular calcifications  identified along the iliac vessels and distal aorta. No discrete abnormal lymph node enlargement identified in the visualized pelvis. Reproductive: Enlarged prostate with mass  effect along the base of the bladder. Other: Extraperitoneal hematoma identified along the right hemipelvis. There is also nonspecific stranding in the presacral space Musculoskeletal: Comminuted fracture involving the right acetabulum superior and medial displacement. Anterior and posterior columns of the acetabulum are involved with the fracture as well. Additional area of injury involves the inferior pubic ramus. This is in 2 areas, midportion and towards the pubic symphysis. No additional areas of fracture or dislocation. Mild degenerative changes seen of the sacroiliac joints, left hip joint. Mild hypertrophic changes of the pubic symphysis. Degenerative changes of the visualized lumbar spine with surgical material along the disc spaces at L4-5 and L5-S1. IMPRESSION: Comminuted displaced fracture of the right acetabulum. This involves all portions of the acetabular including medial as well as the anterior and posterior columns. Separate fracture of the inferior pubic ramus in 2 areas with 1 extending towards the symphysis. Extraperitoneal hematoma along the right hemipelvis. Electronically Signed   By: Karen Kays M.D.   On: 03/28/2023 09:58   DG Chest 1 View  Result Date: 03/28/2023 CLINICAL DATA:  Right hip fracture EXAM: CHEST  1 VIEW COMPARISON:  Previous studies including the examination of 12/26/2022 FINDINGS: Transient diameter of heart is slightly increased. Thoracic aorta is ectatic. Lung fields are clear of any infiltrates or pulmonary edema. There is no pleural effusion or pneumothorax. Deformities are noted in posterior left fourth, fifth and sixth ribs suggesting old healed fractures. There is previous surgical fusion in cervical spine. IMPRESSION: There are no signs of pulmonary edema or focal pulmonary consolidation.  Electronically Signed   By: Ernie Avena M.D.   On: 03/28/2023 08:55   DG Hip Unilat  With Pelvis 2-3 Views Right  Result Date: 03/28/2023 CLINICAL DATA:  Trauma, fall.  Painful to bear weight. EXAM: DG HIP (WITH OR WITHOUT PELVIS) 2-3V RIGHT COMPARISON:  Radiographs dated October 02, 2022 FINDINGS: There is cortical irregularity of the acetabular floor and right pubic rami consistent with pelvic fracture. Mild bilateral hip osteoarthritis. Sacroiliac joints and pubic symphysis appear intact. IMPRESSION: Cortical irregularity of the acetabular floor and right pubic rami consistent with pelvic fracture. CT examination for further evaluation is suggested. Electronically Signed   By: Larose [name] D.O.   On: 03/28/2023 08:48   CT HEAD WO CONTRAST  Result Date: 03/28/2023 CLINICAL DATA:  Trauma EXAM: CT HEAD WITHOUT CONTRAST TECHNIQUE: Contiguous axial images were obtained from the base of the skull through the vertex without intravenous contrast. RADIATION DOSE REDUCTION: This exam was performed according to the departmental dose-optimization program which includes automated exposure control, adjustment of the mA and/or kV according to patient size and/or use of iterative reconstruction technique. COMPARISON:  10/02/2022 FINDINGS: Brain: No acute intracranial findings are seen. There are no signs of bleeding within the cranium. There is interval resolution of small left subdural hematoma. There is prominence of the third and both lateral ventricles. Cortical sulci are prominent. There is decreased density in periventricular and subcortical white matter. Possible small old lacunar infarcts are seen in basal ganglia. There is low-density in the anterior aspect of pons which may be an artifact or old lacunar infarct. Vascular: Scattered arterial calcifications are seen. Skull: No fracture is seen in calvarium. Sinuses/Orbits: There is mucosal thickening in ethmoid, sphenoid and maxillary sinuses. Other:  There is increased amount of CSF in the sella suggesting partial empty sella. IMPRESSION: No acute intracranial findings are seen. There are no signs of bleeding within the cranium. There is no focal mass effect.  Atrophy. Small vessel disease. Possible small old lacunar infarct in pons and basal ganglia. Chronic sinusitis. Electronically Signed   By: Ernie Avena M.D.   On: 03/28/2023 08:20    EKG: Independently reviewed.  NSR with rate 67; no evidence of acute ischemia   Labs on Admission: I have personally reviewed the available labs and imaging studies at the time of the admission.  Pertinent labs:    Unremarkable CMP WBC 7.5 Hgb 11.3 INR 1.1 UA WNL   Assessment and Plan: Principal Problem:   Acetabular fracture (HCC) Active Problems:   Mixed hyperlipidemia   Rheumatoid arthritis (HCC)   Hypertension   CAD (coronary artery disease)   Paroxysmal atrial fibrillation (HCC)   OSA (obstructive sleep apnea)   Alzheimer's dementia (HCC)   Closed stable fracture of multiple pubic rami (HCC)   DNR (do not resuscitate)    R acetabular/pubic rami fractures -Apparently mechanical fall resulting in R acetabular and pubic rami fractures -Orthopedics consulted -NPO after midnight for possible surgical repair tomorrow -SCDs overnight, start Lovenox post-operatively (or as per ortho) -Pain control with Tylenol, Robaxin, Oxycodone, and Morphine prn -TOC team consult for rehab placement - lives in ALF at [place], likely to need SNF rehab there -Will need PT consult post-operatively -Hip fracture order set utilized -TXA per orthopedics -Fascia iliacus block ordered per anesthesia  Pre-operative stratification -Orthopedic/spinal surgery is associated with an intermediate (1-5%) cardiovascular risk for cardiac death and nonfatal MI -With his h/o CAD, his revised cardiac index gives a risk estimate of 6.0% -It is reasonable for him to go to the OR without additional  evaluation  Dementia -Appears to have h/o behavioral disturbance -Will order delirium precautions -Will continue home meds - Donepezil, Escitalopram, Quetiapine -He has been on a Depakote taper, currently on 125 mg daily through 5/15 and then off (concern that this was causing a rash) -Getting prn Ativan at his facility recently due to agitation -Will add prn IV Haldol for now  CAD -DES in 2015 -He does not obviously report recent chest pain although he is confused enough that this is not entirely clear  HTN -Continue Lopressor but he is on 25 mg short-acting metoprolol daily, will change to 12.5 mg BID (or could change to Toprol XL)  HLD -On Repatha as an outpatient -Continue rosuvastatin -Resume Zetia at time of dc  OSA -Will order CPAP  Afib -Rate controlled with metoprolol (see above) -Hold Eliquis for now due to fracture, ?need for operative repair  RA -Previously on Orencia but this was discontinued -Followed by Dr. Dierdre Forth  DNR -Gold DNR form is present at the bedside, confirmed status with wife   Advance Care Planning:   Code Status: DNR   Consults: Orthopedics; SW, Nutrition; will need PT post-operatively   DVT Prophylaxis: SCDs until approved for Lovenox by orthopedics  Family Communication: Wife was present throughout evaluation but also appears to have MCI, spoke with daughter by telephone at the time of admission  Severity of Illness: The appropriate patient status for this patient is INPATIENT. Inpatient status is judged to be reasonable and necessary in order to provide the required intensity of service to ensure the patient's safety. The patient's presenting symptoms, physical exam findings, and initial radiographic and laboratory data in the context of their chronic comorbidities is felt to place them at high risk for further clinical deterioration. Furthermore, it is not anticipated that the patient will be medically stable for discharge from the  hospital within 2 midnights of admission.   *  I certify that at the point of admission it is my clinical judgment that the patient will require inpatient hospital care spanning beyond 2 midnights from the point of admission due to high intensity of service, high risk for further deterioration and high frequency of surveillance required.*  Author: Jonah Blue, MD 03/28/2023 11:37 AM  For on call review [email or web address].

## 2023-03-28 NOTE — Progress Notes (Signed)
Placed patient on CPAP for the night via auto-mode.  

## 2023-03-28 NOTE — Progress Notes (Signed)
Consult request received for right acetabular fracture.  The risks and benefits of surgery for repair versus nonoperative management were discussed with the patient, including the possibility of infection, nerve injury, vessel injury, wound breakdown, arthritis, symptomatic hardware, DVT/ PE, loss of motion, malunion, nonunion, blood loss requiring transfusion, urologic injury, and need for further surgery among others. These risks were acknowledged and consent provided to proceed.  Anticipate further discussion in preoperative holding with daughter Missy [name] tomorrow, as well.   Myrene Galas, MD [facility], [facility] [phone]

## 2023-03-28 NOTE — ED Notes (Signed)
ED TO INPATIENT HANDOFF REPORT  ED Nurse Name and Phone #: [phone]  S Name/Age/Gender [name] 89 y.o. male Room/Bed: 024C/024C  Code Status   Code Status: Prior  Home/SNF/Other Skilled nursing facility Patient oriented to: self and situation Is this baseline? Yes   Triage Complete: Triage complete (Simultaneous filing. User may not have seen previous data.)  Chief Complaint Acetabular fracture (HCC) [S32.409A]  Triage Note BIBA from [facility] for unwitnessed fall at 2200 last night, takes eliquis, c/o right hip pain and unable to bare weight this morning, no obvious deformity, received 650 mg tylenol and 100 mcg fentanyl. A&Ox2-3 at baseline   Allergies Allergies  Allergen Reactions   Methotrexate Other (See Comments)    REACTION: tachycardia   Aricept [Donepezil Hcl] Other (See Comments)    Nightmares. Pt takes this medication per [facility]    Crestor [Rosuvastatin Calcium] Other (See Comments)    Myalgias with 20mg  dose   Lisinopril Other (See Comments)    cough   Namenda [Memantine] Diarrhea and Nausea Only   Atorvastatin Other (See Comments)    Muscle pain, leg cramps   Pravastatin Sodium Other (See Comments)    REACTION: cramps, fatigue    Level of Care/Admitting Diagnosis ED Disposition     ED Disposition  Admit   Condition  --   Comment  Hospital Area: [facility] [[id number]]  Level of Care: Med-Surg [[id number]]  May admit patient to [facility] or [facility] if equivalent level of care is available:: Yes  Covid Evaluation: Asymptomatic - no recent exposure (last 10 days) testing not required  Diagnosis: Acetabular fracture [facility]) [[id number]]  Admitting Physician: Jonah Blue [[id number]]  Attending Physician: Jonah Blue [[id number]]  Certification:: I certify this patient will need inpatient services for at least 2 midnights  Estimated Length of Stay: 3          B Medical/Surgery History Past Medical History:  Diagnosis Date   CAD  (coronary artery disease)    a. s/p inferior STEMI 01/08/2014; LHC 01/08/14: total RCA occlusion s/p 3.5x85mm Xience DES distal RCA and 3.5x28 mm DES mid RCA, 60-70% mid LAD stenosis, EF 55%.   Complication of anesthesia    'long to wake up after back surgery " 09/2003   Diverticulosis of colon    GERD (gastroesophageal reflux disease)    History of cellulitis    05-26-2015  LLE   History of colon polyps    1998- benign/  2008 adenomatous    History of kidney stones    2013   History of squamous cell carcinoma excision    2013;  2015;  06-12-2015 right leg/  02/ and 05/ 2017  left ear and left leg   Hydronephrosis, left    Hyperlipidemia    Hypertension    Migraine with aura    OSA on CPAP 06/19/2015   Moderate OSA with AHI 18/hr  per study 05-20-2015   Osteoarthritis    Paroxysmal atrial fibrillation (HCC) 4/16   chads2vasc score is at least 4   Premature atrial contractions    RA (rheumatoid arthritis) [facility])    rheumatologist-  dr Alben Deeds   Sinusitis, chronic 10/17/2015   Wears glasses    Wears hearing aid    bilateral   Past Surgical History:  Procedure Laterality Date   BACK SURGERY     CARDIOVASCULAR STRESS TEST  11/21/2015   Low risk nuclear study w/ a small diaphragmatic attenuation artifact, no ischemia/  normal LV function  and wall motion , ef 63%   CATARACT EXTRACTION W/ INTRAOCULAR LENS  IMPLANT, BILATERAL  2015   COLONOSCOPY  last one 06-09-2011   CORONARY ANGIOPLASTY     CYSTOSCOPY W/ RETROGRADES Left 07/12/2016   Procedure: CYSTOSCOPY WITH RETROGRADE PYELOGRAM LEFT URETERAL STENT;  Surgeon: Bjorn Pippin, MD;  Location: [facility];  Service: Urology;  Laterality: Left;   CYSTOSCOPY WITH RETROGRADE PYELOGRAM, URETEROSCOPY AND STENT PLACEMENT Left 08/11/2016   Procedure: CYSTOSCOPY WITH RETROGRADE PYELOGRAM,  DIAGNOSTIC URETEROSCOPY , STENT EXCHANGE;  Surgeon: Sebastian Ache, MD;  Location: [facility];  Service: Urology;  Laterality: Left;   DUPUYTREN  CONTRACTURE RELEASE Right 09/30/2009   severe fibromatosis palm and fingers   LEFT HEART CATHETERIZATION WITH CORONARY ANGIOGRAM N/A 01/08/2014   Procedure: LEFT HEART CATHETERIZATION WITH CORONARY ANGIOGRAM;  Surgeon: Lennette Bihari, MD;  Location: [facility] CATH LAB;  Service: Cardiovascular;  Laterality:N/A;  total/ subtotal RCA/  mLAD 60-70% w/ mid systolic bridging/  preserved global LVF, ef 55%   MOHS SURGERY  x2  feb and may 2017   left ear /  left leg  (SCC)   PERCUTANEOUS CORONARY STENT INTERVENTION (PCI-S)  01/08/2014   Procedure: PERCUTANEOUS CORONARY STENT INTERVENTION (PCI-S);  Surgeon: Lennette Bihari, MD;  Location: [facility] CATH LAB;  Service: Cardiovascular;;  DES to mid and distal RCA   POSTERIOR LUMBAR FUSION  10/01/2003   and Laminectomy/ diskectomy  L4 -- S1   ROBOT ASSISTED PYELOPLASTY Left 09/15/2016   Procedure: XI ROBOTIC ASSISTED PYELOPLASTY;  Surgeon: Sebastian Ache, MD;  Location: [facility];  Service: Urology;  Laterality: Left;   TONSILLECTOMY     TRANSTHORACIC ECHOCARDIOGRAM  02/23/2016   mild LVH,  ef 50-55%,  grade 1 diastolic dysfunction/  mild to moderate AV calcification w/ no stenosis or regurg./  trivial MR and TR/  mild PR     A IV Location/Drains/Wounds Patient Lines/Drains/Airways Status     Active Line/Drains/Airways     Name Placement date Placement time Site Days   Peripheral IV 03/28/23 Left Antecubital 03/28/23  0701  Antecubital  less than 1   Peripheral IV 03/28/23 20 G Left;Posterior Wrist 03/28/23  0919  Wrist  less than 1   Closed System Drain 09/15/16  1532  --  2385   Ureteral Drain/Stent Left ureter 6 Fr. 08/11/16  1216  Left ureter  2420   External Urinary Catheter 10/03/22  1900  --  176   Incision - 5 Ports Abdomen 1: Left;Superior 2: Left;Mid 3: Umbilicus 4: Left;Lower;Lateral 5: Left;Lower 09/15/16  1318  -- 2385            Intake/Output Last 24 hours No intake or output data in the 24 hours ending 03/28/23 1038  Labs/Imaging Results for  orders placed or performed during the hospital encounter of 03/28/23 (from the past 48 hour(s))  Comprehensive metabolic panel     Status: Abnormal   Collection Time: 03/28/23  8:15 AM  Result Value Ref Range   Sodium 137 135 - 145 mmol/L   Potassium 3.9 3.5 - 5.1 mmol/L   Chloride 105 98 - 111 mmol/L   CO2 24 22 - 32 mmol/L   Glucose, Bld 105 (H) 70 - 99 mg/dL    Comment: Glucose reference range applies only to samples taken after fasting for at least 8 hours.   BUN 16 8 - 23 mg/dL   Creatinine, Ser 2.95 0.61 - 1.24 mg/dL   Calcium 8.5 (L) 8.9 - 10.3 mg/dL  Total Protein 6.8 6.5 - 8.1 g/dL   Albumin 3.6 3.5 - 5.0 g/dL   AST 17 15 - 41 U/L   ALT 16 0 - 44 U/L   Alkaline Phosphatase 56 38 - 126 U/L   Total Bilirubin 0.6 0.3 - 1.2 mg/dL   GFR, Estimated >29 >56 mL/min    Comment: (NOTE) Calculated using the CKD-EPI Creatinine Equation (2021)    Anion gap 8 5 - 15    Comment: Performed at [facility], [address] [place], [place] [address]  CBC     Status: Abnormal   Collection Time: 03/28/23  8:15 AM  Result Value Ref Range   WBC 7.5 4.0 - 10.5 K/uL   RBC 3.58 (L) 4.22 - 5.81 MIL/uL   Hemoglobin 11.3 (L) 13.0 - 17.0 g/dL   HCT 65.7 (L) 84.6 - 96.2 %   MCV 96.4 80.0 - 100.0 fL   MCH 31.6 26.0 - 34.0 pg   MCHC 32.8 30.0 - 36.0 g/dL   RDW 95.2 84.1 - 32.4 %   Platelets 270 150 - 400 K/uL   nRBC 0.0 0.0 - 0.2 %    Comment: Performed at [facility], [address], [place], [place] [address]  Protime-INR     Status: None   Collection Time: 03/28/23  8:15 AM  Result Value Ref Range   Prothrombin Time 14.4 11.4 - 15.2 seconds   INR 1.1 0.8 - 1.2    Comment: (NOTE) INR goal varies based on device and disease states. Performed at [facility], [address] [place], [place] [address]   Type and screen [facility]     Status: None   Collection Time: 03/28/23  8:15 AM  Result Value Ref Range   ABO/RH(D) A POS    Antibody Screen NEG     Sample Expiration      03/31/2023,2359 Performed at [facility], [address] [place], [place] [address]   Urinalysis, Routine w reflex microscopic -Urine, Clean Catch     Status: None   Collection Time: 03/28/23  9:45 AM  Result Value Ref Range   Color, Urine YELLOW YELLOW   APPearance CLEAR CLEAR   Specific Gravity, Urine 1.012 1.005 - 1.030   pH 6.0 5.0 - 8.0   Glucose, UA NEGATIVE NEGATIVE mg/dL   Hgb urine dipstick NEGATIVE NEGATIVE   Bilirubin Urine NEGATIVE NEGATIVE   Ketones, ur NEGATIVE NEGATIVE mg/dL   Protein, ur NEGATIVE NEGATIVE mg/dL   Nitrite NEGATIVE NEGATIVE   Leukocytes,Ua NEGATIVE NEGATIVE    Comment: Performed at [facility], [address], [place], [place] [address]   CT PELVIS WO CONTRAST  Result Date: 03/28/2023 CLINICAL DATA:  Hip fracture. EXAM: CT PELVIS WITHOUT CONTRAST TECHNIQUE: Multidetector CT imaging of the pelvis was performed following the standard protocol without intravenous contrast. RADIATION DOSE REDUCTION: This exam was performed according to the departmental dose-optimization program which includes automated exposure control, adjustment of the mA and/or kV according to patient size and/or use of iterative reconstruction technique. COMPARISON:  X-ray earlier 03/28/2023.  CT scan 10/02/2022 FINDINGS: Urinary Tract: Enlarged prostate with mass effect along the base of the bladder. Bladder wall is slightly thickened and trabeculated. Bowel: Few sigmoid colon diverticula identified. Scattered colonic stool. The visualized bowel in the pelvis is nondilated. Normal appendix. Vascular/Lymphatic: Diffuse vascular calcifications identified along the iliac vessels and distal aorta. No discrete abnormal lymph node enlargement identified in the  visualized pelvis. Reproductive: Enlarged prostate with mass effect along the base of the bladder. Other: Extraperitoneal hematoma identified along the right hemipelvis. There is also nonspecific stranding  in the presacral space Musculoskeletal: Comminuted fracture involving the right acetabulum superior and medial displacement. Anterior and posterior columns of the acetabulum are involved with the fracture as well. Additional area of injury involves the inferior pubic ramus. This is in 2 areas, midportion and towards the pubic symphysis. No additional areas of fracture or dislocation. Mild degenerative changes seen of the sacroiliac joints, left hip joint. Mild hypertrophic changes of the pubic symphysis. Degenerative changes of the visualized lumbar spine with surgical material along the disc spaces at L4-5 and L5-S1. IMPRESSION: Comminuted displaced fracture of the right acetabulum. This involves all portions of the acetabular including medial as well as the anterior and posterior columns. Separate fracture of the inferior pubic ramus in 2 areas with 1 extending towards the symphysis. Extraperitoneal hematoma along the right hemipelvis. Electronically Signed   By: Karen Kays M.D.   On: 03/28/2023 09:58   DG Chest 1 View  Result Date: 03/28/2023 CLINICAL DATA:  Right hip fracture EXAM: CHEST  1 VIEW COMPARISON:  Previous studies including the examination of 12/26/2022 FINDINGS: Transient diameter of heart is slightly increased. Thoracic aorta is ectatic. Lung fields are clear of any infiltrates or pulmonary edema. There is no pleural effusion or pneumothorax. Deformities are noted in posterior left fourth, fifth and sixth ribs suggesting old healed fractures. There is previous surgical fusion in cervical spine. IMPRESSION: There are no signs of pulmonary edema or focal pulmonary consolidation. Electronically Signed   By: Ernie Avena M.D.   On: 03/28/2023 08:55   DG Hip Unilat  With Pelvis 2-3 Views Right  Result Date: 03/28/2023 CLINICAL DATA:  Trauma, fall.  Painful to bear weight. EXAM: DG HIP (WITH OR WITHOUT PELVIS) 2-3V RIGHT COMPARISON:  Radiographs dated October 02, 2022 FINDINGS: There is  cortical irregularity of the acetabular floor and right pubic rami consistent with pelvic fracture. Mild bilateral hip osteoarthritis. Sacroiliac joints and pubic symphysis appear intact. IMPRESSION: Cortical irregularity of the acetabular floor and right pubic rami consistent with pelvic fracture. CT examination for further evaluation is suggested. Electronically Signed   By: Larose [name] D.O.   On: 03/28/2023 08:48   CT HEAD WO CONTRAST  Result Date: 03/28/2023 CLINICAL DATA:  Trauma EXAM: CT HEAD WITHOUT CONTRAST TECHNIQUE: Contiguous axial images were obtained from the base of the skull through the vertex without intravenous contrast. RADIATION DOSE REDUCTION: This exam was performed according to the departmental dose-optimization program which includes automated exposure control, adjustment of the mA and/or kV according to patient size and/or use of iterative reconstruction technique. COMPARISON:  10/02/2022 FINDINGS: Brain: No acute intracranial findings are seen. There are no signs of bleeding within the cranium. There is interval resolution of small left subdural hematoma. There is prominence of the third and both lateral ventricles. Cortical sulci are prominent. There is decreased density in periventricular and subcortical white matter. Possible small old lacunar infarcts are seen in basal ganglia. There is low-density in the anterior aspect of pons which may be an artifact or old lacunar infarct. Vascular: Scattered arterial calcifications are seen. Skull: No fracture is seen in calvarium. Sinuses/Orbits: There is mucosal thickening in ethmoid, sphenoid and maxillary sinuses. Other: There is increased amount of CSF in the sella suggesting partial empty sella. IMPRESSION: No acute intracranial findings are seen. There are no signs of bleeding within the  cranium. There is no focal mass effect. Atrophy. Small vessel disease. Possible small old lacunar infarct in pons and basal ganglia. Chronic sinusitis.  Electronically Signed   By: Ernie Avena M.D.   On: 03/28/2023 08:20    Pending Labs Unresulted Labs (From admission, onward)    None       Vitals/Pain Today's Vitals   03/28/23 0805 03/28/23 0815 03/28/23 0830 03/28/23 0900  BP: 135/78  131/82 123/83  Pulse: 65  64 66  Resp: 11  12 14   Temp:      TempSrc:      SpO2: 100%  93% 90%  Weight:      Height:      PainSc:  6       Isolation Precautions No active isolations  Medications Medications  0.9 %  sodium chloride infusion ( Intravenous New Bag/Given 03/28/23 0816)  fentaNYL (SUBLIMAZE) injection 50 mcg (50 mcg Intravenous Given 03/28/23 0818)  ondansetron (ZOFRAN) injection 4 mg (4 mg Intravenous Given 03/28/23 0816)    Mobility walks with device     Focused Assessments Cardiac Assessment Handoff:    Lab Results  Component Value Date   CKTOTAL 114 05/24/2013   CKMB 2.5 09/01/2011   TROPONINI <0.03 02/20/2015   No results found for: "DDIMER" Does the Patient currently have chest pain? No  Larey Seat today with right hip pain  R Recommendations: See Admitting Provider Note  Report given to:   Additional Notes: Family at bedside

## 2023-03-28 NOTE — Progress Notes (Signed)
Orthopedic Tech Progress Note Patient Details:  [name] [facility] [birth year] [id number]  Patient ID: [name], male   DOB: 18-Aug-1933, 89 y.o.   MRN: [id number] Pt exceeds age limit for issuing of a overhead trapeze.  [name] OTR/L 03/28/2023, 4:48 PM

## 2023-03-28 NOTE — Consult Note (Signed)
Reason for Consult:Right acetabulum fx Referring Physician: Gwyneth Sprout Time called: 1006 Time at bedside: 1116   [name] is an 89 y.o. male.  HPI: Laman fell out of bed last night. He had immediate and severe pain to his right hip. He was brought to the ED where x-rays showed an acetabulum fx and orthopedic surgery was consulted. He lives in memory care at [facility] and did not use any assistive devices to ambulate.  Past Medical History:  Diagnosis Date   CAD (coronary artery disease)    a. s/p inferior STEMI 01/08/2014; LHC 01/08/14: total RCA occlusion s/p 3.5x31mm Xience DES distal RCA and 3.5x28 mm DES mid RCA, 60-70% mid LAD stenosis, EF 55%.   Complication of anesthesia    'long to wake up after back surgery " 09/2003   Diverticulosis of colon    GERD (gastroesophageal reflux disease)    History of cellulitis    05-26-2015  LLE   History of colon polyps    1998- benign/  2008 adenomatous    History of kidney stones    2013   History of squamous cell carcinoma excision    2013;  2015;  06-12-2015 right leg/  02/ and 05/ 2017  left ear and left leg   Hydronephrosis, left    Hyperlipidemia    Hypertension    Migraine with aura    OSA on CPAP 06/19/2015   Moderate OSA with AHI 18/hr  per study 05-20-2015   Osteoarthritis    Paroxysmal atrial fibrillation (HCC) 4/16   chads2vasc score is at least 4   Premature atrial contractions    RA (rheumatoid arthritis) [facility])    rheumatologist-  dr Alben Deeds   Sinusitis, chronic 10/17/2015   Wears glasses    Wears hearing aid    bilateral    Past Surgical History:  Procedure Laterality Date   BACK SURGERY     CARDIOVASCULAR STRESS TEST  11/21/2015   Low risk nuclear study w/ a small diaphragmatic attenuation artifact, no ischemia/  normal LV function and wall motion , ef 63%   CATARACT EXTRACTION W/ INTRAOCULAR LENS  IMPLANT, BILATERAL  2015   COLONOSCOPY  last one 06-09-2011   CORONARY ANGIOPLASTY     CYSTOSCOPY  W/ RETROGRADES Left 07/12/2016   Procedure: CYSTOSCOPY WITH RETROGRADE PYELOGRAM LEFT URETERAL STENT;  Surgeon: Bjorn Pippin, MD;  Location: [facility];  Service: Urology;  Laterality: Left;   CYSTOSCOPY WITH RETROGRADE PYELOGRAM, URETEROSCOPY AND STENT PLACEMENT Left 08/11/2016   Procedure: CYSTOSCOPY WITH RETROGRADE PYELOGRAM,  DIAGNOSTIC URETEROSCOPY , STENT EXCHANGE;  Surgeon: Sebastian Ache, MD;  Location: [facility];  Service: Urology;  Laterality: Left;   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   severe fibromatosis palm and fingers   LEFT HEART CATHETERIZATION WITH CORONARY ANGIOGRAM N/A 01/08/2014   Procedure: LEFT HEART CATHETERIZATION WITH CORONARY ANGIOGRAM;  Surgeon: Lennette Bihari, MD;  Location: [facility] CATH LAB;  Service: Cardiovascular;  Laterality:N/A;  total/ subtotal RCA/  mLAD 60-70% w/ mid systolic bridging/  preserved global LVF, ef 55%   MOHS SURGERY  x2  feb and may 2017   left ear /  left leg  (SCC)   PERCUTANEOUS CORONARY STENT INTERVENTION (PCI-S)  01/08/2014   Procedure: PERCUTANEOUS CORONARY STENT INTERVENTION (PCI-S);  Surgeon: Lennette Bihari, MD;  Location: [facility] CATH LAB;  Service: Cardiovascular;;  DES to mid and distal RCA   POSTERIOR LUMBAR FUSION  10/01/2003   and Laminectomy/ diskectomy  L4 -- S1  ROBOT ASSISTED PYELOPLASTY Left 09/15/2016   Procedure: XI ROBOTIC ASSISTED PYELOPLASTY;  Surgeon: Sebastian Ache, MD;  Location: [facility];  Service: Urology;  Laterality: Left;   TONSILLECTOMY     TRANSTHORACIC ECHOCARDIOGRAM  02/23/2016   mild LVH,  ef 50-55%,  grade 1 diastolic dysfunction/  mild to moderate AV calcification w/ no stenosis or regurg./  trivial MR and TR/  mild PR    Family History  Problem Relation Age of Onset   Hypertension Mother    Heart disease Father    Colon cancer Neg Hx     Social History:  reports that he quit smoking about 54 years ago. His smoking use included cigarettes. He has never used smokeless tobacco. He reports that  he does not currently use alcohol after a past usage of about 2.0 standard drinks of alcohol per week. He reports that he does not use drugs.  Allergies:  Allergies  Allergen Reactions   Methotrexate Other (See Comments)    REACTION: tachycardia   Aricept [Donepezil Hcl] Other (See Comments)    Nightmares. Pt takes this medication per [facility]    Crestor [Rosuvastatin Calcium] Other (See Comments)    Myalgias with 20mg  dose   Lisinopril Other (See Comments)    cough   Namenda [Memantine] Diarrhea and Nausea Only   Atorvastatin Other (See Comments)    Muscle pain, leg cramps   Pravastatin Sodium Other (See Comments)    REACTION: cramps, fatigue    Medications: I have reviewed the patient's current medications.  Results for orders placed or performed during the hospital encounter of 03/28/23 (from the past 48 hour(s))  Comprehensive metabolic panel     Status: Abnormal   Collection Time: 03/28/23  8:15 AM  Result Value Ref Range   Sodium 137 135 - 145 mmol/L   Potassium 3.9 3.5 - 5.1 mmol/L   Chloride 105 98 - 111 mmol/L   CO2 24 22 - 32 mmol/L   Glucose, Bld 105 (H) 70 - 99 mg/dL    Comment: Glucose reference range applies only to samples taken after fasting for at least 8 hours.   BUN 16 8 - 23 mg/dL   Creatinine, Ser 1.61 0.61 - 1.24 mg/dL   Calcium 8.5 (L) 8.9 - 10.3 mg/dL   Total Protein 6.8 6.5 - 8.1 g/dL   Albumin 3.6 3.5 - 5.0 g/dL   AST 17 15 - 41 U/L   ALT 16 0 - 44 U/L   Alkaline Phosphatase 56 38 - 126 U/L   Total Bilirubin 0.6 0.3 - 1.2 mg/dL   GFR, Estimated >09 >60 mL/min    Comment: (NOTE) Calculated using the CKD-EPI Creatinine Equation (2021)    Anion gap 8 5 - 15    Comment: Performed at [facility], [address] [place], [place] [address]  CBC     Status: Abnormal   Collection Time: 03/28/23  8:15 AM  Result Value Ref Range   WBC 7.5 4.0 - 10.5 K/uL   RBC 3.58 (L) 4.22 - 5.81 MIL/uL   Hemoglobin 11.3 (L) 13.0 - 17.0 g/dL   HCT 81.1 (L) 91.4  - 52.0 %   MCV 96.4 80.0 - 100.0 fL   MCH 31.6 26.0 - 34.0 pg   MCHC 32.8 30.0 - 36.0 g/dL   RDW 78.2 95.6 - 21.3 %   Platelets 270 150 - 400 K/uL   nRBC 0.0 0.0 - 0.2 %    Comment: Performed at [facility]  Lab, [address], [place], [place] [address]  Protime-INR     Status: None   Collection Time: 03/28/23  8:15 AM  Result Value Ref Range   Prothrombin Time 14.4 11.4 - 15.2 seconds   INR 1.1 0.8 - 1.2    Comment: (NOTE) INR goal varies based on device and disease states. Performed at [facility], [address], [place], [place] [address]   Type and screen [facility]     Status: None   Collection Time: 03/28/23  8:15 AM  Result Value Ref Range   ABO/RH(D) A POS    Antibody Screen NEG    Sample Expiration      03/31/2023,2359 Performed at [facility], [address] [place], [place] [address]   Urinalysis, Routine w reflex microscopic -Urine, Clean Catch     Status: None   Collection Time: 03/28/23  9:45 AM  Result Value Ref Range   Color, Urine YELLOW YELLOW   APPearance CLEAR CLEAR   Specific Gravity, Urine 1.012 1.005 - 1.030   pH 6.0 5.0 - 8.0   Glucose, UA NEGATIVE NEGATIVE mg/dL   Hgb urine dipstick NEGATIVE NEGATIVE   Bilirubin Urine NEGATIVE NEGATIVE   Ketones, ur NEGATIVE NEGATIVE mg/dL   Protein, ur NEGATIVE NEGATIVE mg/dL   Nitrite NEGATIVE NEGATIVE   Leukocytes,Ua NEGATIVE NEGATIVE    Comment: Performed at [facility], [address], [place], [place] [address]    CT PELVIS WO CONTRAST  Result Date: 03/28/2023 CLINICAL DATA:  Hip fracture. EXAM: CT PELVIS WITHOUT CONTRAST TECHNIQUE: Multidetector CT imaging of the pelvis was performed following the standard protocol without intravenous contrast. RADIATION DOSE REDUCTION: This exam was performed according to the departmental dose-optimization program which includes automated exposure control, adjustment of the mA and/or kV according to patient size and/or use of  iterative reconstruction technique. COMPARISON:  X-ray earlier 03/28/2023.  CT scan 10/02/2022 FINDINGS: Urinary Tract: Enlarged prostate with mass effect along the base of the bladder. Bladder wall is slightly thickened and trabeculated. Bowel: Few sigmoid colon diverticula identified. Scattered colonic stool. The visualized bowel in the pelvis is nondilated. Normal appendix. Vascular/Lymphatic: Diffuse vascular calcifications identified along the iliac vessels and distal aorta. No discrete abnormal lymph node enlargement identified in the visualized pelvis. Reproductive: Enlarged prostate with mass effect along the base of the bladder. Other: Extraperitoneal hematoma identified along the right hemipelvis. There is also nonspecific stranding in the presacral space Musculoskeletal: Comminuted fracture involving the right acetabulum superior and medial displacement. Anterior and posterior columns of the acetabulum are involved with the fracture as well. Additional area of injury involves the inferior pubic ramus. This is in 2 areas, midportion and towards the pubic symphysis. No additional areas of fracture or dislocation. Mild degenerative changes seen of the sacroiliac joints, left hip joint. Mild hypertrophic changes of the pubic symphysis. Degenerative changes of the visualized lumbar spine with surgical material along the disc spaces at L4-5 and L5-S1. IMPRESSION: Comminuted displaced fracture of the right acetabulum. This involves all portions of the acetabular including medial as well as the anterior and posterior columns. Separate fracture of the inferior pubic ramus in 2 areas with 1 extending towards the symphysis. Extraperitoneal hematoma along the right hemipelvis. Electronically Signed   By: Karen Kays M.D.   On: 03/28/2023 09:58   DG Chest 1 View  Result Date: 03/28/2023 CLINICAL DATA:  Right hip fracture EXAM: CHEST  1 VIEW COMPARISON:  Previous studies including  the examination of 12/26/2022  FINDINGS: Transient diameter of heart is slightly increased. Thoracic aorta is ectatic. Lung fields are clear of any infiltrates or pulmonary edema. There is no pleural effusion or pneumothorax. Deformities are noted in posterior left fourth, fifth and sixth ribs suggesting old healed fractures. There is previous surgical fusion in cervical spine. IMPRESSION: There are no signs of pulmonary edema or focal pulmonary consolidation. Electronically Signed   By: Ernie Avena M.D.   On: 03/28/2023 08:55   DG Hip Unilat  With Pelvis 2-3 Views Right  Result Date: 03/28/2023 CLINICAL DATA:  Trauma, fall.  Painful to bear weight. EXAM: DG HIP (WITH OR WITHOUT PELVIS) 2-3V RIGHT COMPARISON:  Radiographs dated October 02, 2022 FINDINGS: There is cortical irregularity of the acetabular floor and right pubic rami consistent with pelvic fracture. Mild bilateral hip osteoarthritis. Sacroiliac joints and pubic symphysis appear intact. IMPRESSION: Cortical irregularity of the acetabular floor and right pubic rami consistent with pelvic fracture. CT examination for further evaluation is suggested. Electronically Signed   By: Larose [name] D.O.   On: 03/28/2023 08:48   CT HEAD WO CONTRAST  Result Date: 03/28/2023 CLINICAL DATA:  Trauma EXAM: CT HEAD WITHOUT CONTRAST TECHNIQUE: Contiguous axial images were obtained from the base of the skull through the vertex without intravenous contrast. RADIATION DOSE REDUCTION: This exam was performed according to the departmental dose-optimization program which includes automated exposure control, adjustment of the mA and/or kV according to patient size and/or use of iterative reconstruction technique. COMPARISON:  10/02/2022 FINDINGS: Brain: No acute intracranial findings are seen. There are no signs of bleeding within the cranium. There is interval resolution of small left subdural hematoma. There is prominence of the third and both lateral ventricles. Cortical sulci are prominent.  There is decreased density in periventricular and subcortical white matter. Possible small old lacunar infarcts are seen in basal ganglia. There is low-density in the anterior aspect of pons which may be an artifact or old lacunar infarct. Vascular: Scattered arterial calcifications are seen. Skull: No fracture is seen in calvarium. Sinuses/Orbits: There is mucosal thickening in ethmoid, sphenoid and maxillary sinuses. Other: There is increased amount of CSF in the sella suggesting partial empty sella. IMPRESSION: No acute intracranial findings are seen. There are no signs of bleeding within the cranium. There is no focal mass effect. Atrophy. Small vessel disease. Possible small old lacunar infarct in pons and basal ganglia. Chronic sinusitis. Electronically Signed   By: Ernie Avena M.D.   On: 03/28/2023 08:20    Review of Systems  Unable to perform ROS: Dementia  Musculoskeletal:  Positive for arthralgias (Right hip).   Blood pressure (!) 155/101, pulse 64, temperature 97.9 F (36.6 C), temperature source Oral, resp. rate (!) 24, height 6' (1.829 m), weight 87.7 kg, SpO2 98 %. Physical Exam Constitutional:      General: He is not in acute distress.    Appearance: He is well-developed. He is not diaphoretic.  HENT:     Head: Normocephalic and atraumatic.  Eyes:     General: No scleral icterus.       Right eye: No discharge.        Left eye: No discharge.     Conjunctiva/sclera: Conjunctivae normal.  Cardiovascular:     Rate and Rhythm: Normal rate and regular rhythm.  Pulmonary:     Effort: Pulmonary effort is normal. No respiratory distress.  Musculoskeletal:     Cervical back: Normal range of motion.     Comments:  RLE No traumatic wounds, ecchymosis, or rash  Mod TTP hip  No knee or ankle effusion  Knee stable to varus/ valgus and anterior/posterior stress  Sens DPN, SPN, TN intact  Motor EHL, ext, flex, evers 5/5  DP 2+, PT 2+, No significant edema  Skin:    General: Skin  is warm and dry.  Neurological:     Mental Status: He is alert.  Psychiatric:        Mood and Affect: Mood normal.        Behavior: Behavior normal.     Assessment/Plan: Right acetabulum fx -- Tentatively plan ORIF later in week with Dr. Jena Gauss. NWB for now. Multiple medical problems including CAD, HTN, HLD, OSA on CPAP, afib on Eliquis, dementia, and RA  -- per primary service. Please hold Eliquis.    Freeman Caldron, PA-C Orthopedic Surgery [phone] 03/28/2023, 12:24 PM

## 2023-03-28 NOTE — ED Notes (Signed)
Missy daughter updated on patient care and plan.

## 2023-03-28 NOTE — ED Triage Notes (Signed)
BIBA from [facility] for unwitnessed fall at 2200 last night, takes eliquis, c/o right hip pain and unable to bare weight this morning, no obvious deformity, received 650 mg tylenol and 100 mcg fentanyl. A&Ox2-3 at baseline

## 2023-03-28 NOTE — ED Notes (Signed)
Pt has high risk fall bundle in place including: fall bracelet, fall alarm, non-skid socks in place.

## 2023-03-28 NOTE — ED Notes (Signed)
Family at bedside. 

## 2023-03-28 NOTE — ED Notes (Signed)
Pt transported to CT ?

## 2023-03-29 ENCOUNTER — Inpatient Hospital Stay (HOSPITAL_COMMUNITY): Payer: Medicare Other

## 2023-03-29 ENCOUNTER — Encounter (HOSPITAL_COMMUNITY): Payer: Self-pay | Admitting: Internal Medicine

## 2023-03-29 ENCOUNTER — Inpatient Hospital Stay (HOSPITAL_COMMUNITY): Payer: Medicare Other | Admitting: Anesthesiology

## 2023-03-29 ENCOUNTER — Encounter (HOSPITAL_COMMUNITY)
Admission: EM | Disposition: A | Discharge status: Skilled Nursing Facility | Payer: Self-pay | Source: Skilled Nursing Facility | Attending: Internal Medicine

## 2023-03-29 DIAGNOSIS — G4733 Obstructive sleep apnea (adult) (pediatric): Secondary | ICD-10-CM

## 2023-03-29 DIAGNOSIS — I10 Essential (primary) hypertension: Secondary | ICD-10-CM

## 2023-03-29 DIAGNOSIS — D649 Anemia, unspecified: Secondary | ICD-10-CM

## 2023-03-29 DIAGNOSIS — I2510 Atherosclerotic heart disease of native coronary artery without angina pectoris: Secondary | ICD-10-CM

## 2023-03-29 DIAGNOSIS — G308 Other Alzheimer's disease: Secondary | ICD-10-CM | POA: Diagnosis not present

## 2023-03-29 DIAGNOSIS — S32431A Displaced fracture of anterior column [iliopubic] of right acetabulum, initial encounter for closed fracture: Secondary | ICD-10-CM

## 2023-03-29 DIAGNOSIS — I480 Paroxysmal atrial fibrillation: Secondary | ICD-10-CM | POA: Diagnosis not present

## 2023-03-29 DIAGNOSIS — F02B3 Dementia in other diseases classified elsewhere, moderate, with mood disturbance: Secondary | ICD-10-CM

## 2023-03-29 DIAGNOSIS — E782 Mixed hyperlipidemia: Secondary | ICD-10-CM

## 2023-03-29 DIAGNOSIS — S32461A Displaced associated transverse-posterior fracture of right acetabulum, initial encounter for closed fracture: Secondary | ICD-10-CM

## 2023-03-29 DIAGNOSIS — M069 Rheumatoid arthritis, unspecified: Secondary | ICD-10-CM

## 2023-03-29 DIAGNOSIS — S32401K Unspecified fracture of right acetabulum, subsequent encounter for fracture with nonunion: Secondary | ICD-10-CM | POA: Diagnosis not present

## 2023-03-29 HISTORY — PX: ORIF ACETABULAR FRACTURE: SHX5029

## 2023-03-29 LAB — BPAM RBC
Blood Product Expiration Date: 202406072359
Unit Type and Rh: 6200

## 2023-03-29 LAB — BASIC METABOLIC PANEL
Anion gap: 8 (ref 5–15)
BUN: 19 mg/dL (ref 8–23)
CO2: 25 mmol/L (ref 22–32)
Calcium: 8.7 mg/dL — ABNORMAL LOW (ref 8.9–10.3)
Chloride: 104 mmol/L (ref 98–111)
Creatinine, Ser: 0.91 mg/dL (ref 0.61–1.24)
GFR, Estimated: >60 mL/min (ref >60)
Glucose, Bld: 112 mg/dL — ABNORMAL HIGH (ref 70–99)
Potassium: 3.6 mmol/L (ref 3.5–5.1)
Sodium: 137 mmol/L (ref 135–145)

## 2023-03-29 LAB — CBC
HCT: 34.6 % — ABNORMAL LOW (ref 39.0–52.0)
Hemoglobin: 11.2 g/dL — ABNORMAL LOW (ref 13.0–17.0)
MCH: 30.8 pg (ref 26.0–34.0)
MCHC: 32.4 g/dL (ref 30.0–36.0)
MCV: 95.1 fL (ref 80.0–100.0)
Platelets: 264 10*3/uL (ref 150–400)
RBC: 3.64 MIL/uL — ABNORMAL LOW (ref 4.22–5.81)
RDW: 15.5 % (ref 11.5–15.5)
WBC: 6.9 10*3/uL (ref 4.0–10.5)
nRBC: 0 % (ref 0.0–0.2)

## 2023-03-29 LAB — PREPARE RBC (CROSSMATCH)

## 2023-03-29 LAB — TYPE AND SCREEN
ABO/RH(D): A POS
Antibody Screen: NEGATIVE
Unit division: 0
Unit division: 0

## 2023-03-29 LAB — MAGNESIUM: Magnesium: 2.1 mg/dL (ref 1.7–2.4)

## 2023-03-29 SURGERY — OPEN REDUCTION INTERNAL FIXATION (ORIF) ACETABULAR FRACTURE
Anesthesia: General | Site: Hip | Laterality: Right

## 2023-03-29 MED ORDER — 0.9 % SODIUM CHLORIDE (POUR BTL) OPTIME
TOPICAL | Status: DC | PRN
  Administered 2023-03-29: 1000 mL

## 2023-03-29 MED ORDER — VANCOMYCIN HCL 1000 MG IV SOLR
INTRAVENOUS | Status: AC
  Filled 2023-03-29: qty 20

## 2023-03-29 MED ORDER — PHENYLEPHRINE 80 MCG/ML (10ML) SYRINGE FOR IV PUSH (FOR BLOOD PRESSURE SUPPORT)
PREFILLED_SYRINGE | INTRAVENOUS | Status: DC | PRN
  Administered 2023-03-29 (×3): 160 ug via INTRAVENOUS
  Administered 2023-03-29: 320 ug via INTRAVENOUS
  Administered 2023-03-29: 80 ug via INTRAVENOUS

## 2023-03-29 MED ORDER — SODIUM CHLORIDE 0.9 % IV SOLN: INTRAVENOUS | Status: DC

## 2023-03-29 MED ORDER — FENTANYL CITRATE (PF) 100 MCG/2ML IJ SOLN: 25.0000 ug | INTRAMUSCULAR | Status: DC | PRN

## 2023-03-29 MED ORDER — FENTANYL CITRATE (PF) 250 MCG/5ML IJ SOLN
INTRAMUSCULAR | Status: AC
  Filled 2023-03-29: qty 5

## 2023-03-29 MED ORDER — CEFAZOLIN SODIUM-DEXTROSE 2-3 GM-%(50ML) IV SOLR
INTRAVENOUS | Status: DC | PRN
  Administered 2023-03-29: 2 g via INTRAVENOUS

## 2023-03-29 MED ORDER — ACETAMINOPHEN 10 MG/ML IV SOLN
INTRAVENOUS | Status: AC
  Filled 2023-03-29: qty 100

## 2023-03-29 MED ORDER — LIDOCAINE 2% (20 MG/ML) 5 ML SYRINGE
INTRAMUSCULAR | Status: AC
  Filled 2023-03-29: qty 5

## 2023-03-29 MED ORDER — ACETAMINOPHEN 10 MG/ML IV SOLN
INTRAVENOUS | Status: DC | PRN
  Administered 2023-03-29: 1000 mg via INTRAVENOUS

## 2023-03-29 MED ORDER — ONDANSETRON HCL 4 MG/2ML IJ SOLN
INTRAMUSCULAR | Status: AC
  Filled 2023-03-29: qty 2

## 2023-03-29 MED ORDER — PROPOFOL 10 MG/ML IV BOLUS
INTRAVENOUS | Status: DC | PRN
  Administered 2023-03-29: 40 mg via INTRAVENOUS
  Administered 2023-03-29 (×2): 20 mg via INTRAVENOUS

## 2023-03-29 MED ORDER — CHLORHEXIDINE GLUCONATE 0.12 % MT SOLN
OROMUCOSAL | Status: AC
  Administered 2023-03-29: 15 mL via OROMUCOSAL
  Filled 2023-03-29: qty 15

## 2023-03-29 MED ORDER — PHENYLEPHRINE HCL-NACL 20-0.9 MG/250ML-% IV SOLN
INTRAVENOUS | Status: DC | PRN
  Administered 2023-03-29: 100 ug/min via INTRAVENOUS

## 2023-03-29 MED ORDER — CEFAZOLIN SODIUM-DEXTROSE 2-4 GM/100ML-% IV SOLN
INTRAVENOUS | Status: AC
  Filled 2023-03-29: qty 100

## 2023-03-29 MED ORDER — CEFAZOLIN SODIUM-DEXTROSE 2-4 GM/100ML-% IV SOLN
2.0000 g | Freq: Three times a day (TID) | INTRAVENOUS | Status: AC
  Administered 2023-03-29 – 2023-03-30 (×3): 2 g via INTRAVENOUS
  Filled 2023-03-29 (×3): qty 100

## 2023-03-29 MED ORDER — SODIUM CHLORIDE 0.9 % IV SOLN: 10.0000 mL/h | Freq: Once | INTRAVENOUS | Status: DC

## 2023-03-29 MED ORDER — FENTANYL CITRATE (PF) 250 MCG/5ML IJ SOLN
INTRAMUSCULAR | Status: DC | PRN
  Administered 2023-03-29 (×2): 25 ug via INTRAVENOUS
  Administered 2023-03-29: 50 ug via INTRAVENOUS
  Administered 2023-03-29: 100 ug via INTRAVENOUS

## 2023-03-29 MED ORDER — SODIUM CHLORIDE (PF) 0.9 % IJ SOLN
INTRAMUSCULAR | Status: AC
  Filled 2023-03-29: qty 20

## 2023-03-29 MED ORDER — ALBUMIN HUMAN 5 % IV SOLN: INTRAVENOUS | Status: DC | PRN

## 2023-03-29 MED ORDER — PHENYLEPHRINE 80 MCG/ML (10ML) SYRINGE FOR IV PUSH (FOR BLOOD PRESSURE SUPPORT)
PREFILLED_SYRINGE | INTRAVENOUS | Status: AC
  Filled 2023-03-29: qty 10

## 2023-03-29 MED ORDER — SODIUM CHLORIDE 0.9 % IV SOLN: INTRAVENOUS | Status: DC | PRN

## 2023-03-29 MED ORDER — VASOPRESSIN 20 UNIT/ML IV SOLN
INTRAVENOUS | Status: DC | PRN
  Administered 2023-03-29: 1 [IU] via INTRAVENOUS

## 2023-03-29 MED ORDER — ROCURONIUM BROMIDE 50 MG/5ML IV SOSY
PREFILLED_SYRINGE | INTRAVENOUS | Status: DC | PRN
  Administered 2023-03-29: 30 mg via INTRAVENOUS
  Administered 2023-03-29: 60 mg via INTRAVENOUS
  Administered 2023-03-29: 40 mg via INTRAVENOUS

## 2023-03-29 MED ORDER — TRANEXAMIC ACID-NACL 1000-0.7 MG/100ML-% IV SOLN
INTRAVENOUS | Status: AC
  Filled 2023-03-29: qty 100

## 2023-03-29 MED ORDER — DEXAMETHASONE SODIUM PHOSPHATE 10 MG/ML IJ SOLN
INTRAMUSCULAR | Status: DC | PRN
  Administered 2023-03-29: 10 mg via INTRAVENOUS

## 2023-03-29 MED ORDER — PROPOFOL 10 MG/ML IV BOLUS
INTRAVENOUS | Status: AC
  Filled 2023-03-29: qty 20

## 2023-03-29 MED ORDER — ADULT MULTIVITAMIN W/MINERALS CH
1.0000 | ORAL_TABLET | Freq: Every day | ORAL | Status: DC
  Administered 2023-03-30 – 2023-04-01 (×3): 1 via ORAL
  Filled 2023-03-29 (×3): qty 1

## 2023-03-29 MED ORDER — ONDANSETRON HCL 4 MG/2ML IJ SOLN: 4.0000 mg | Freq: Once | INTRAMUSCULAR | Status: DC | PRN

## 2023-03-29 MED ORDER — TRANEXAMIC ACID-NACL 1000-0.7 MG/100ML-% IV SOLN
INTRAVENOUS | Status: DC | PRN
  Administered 2023-03-29: 1000 mg via INTRAVENOUS

## 2023-03-29 MED ORDER — CHLORHEXIDINE GLUCONATE 0.12 % MT SOLN: 15.0000 mL | Freq: Once | OROMUCOSAL | Status: AC

## 2023-03-29 MED ORDER — AMISULPRIDE (ANTIEMETIC) 5 MG/2ML IV SOLN: 10.0000 mg | Freq: Once | INTRAVENOUS | Status: DC | PRN

## 2023-03-29 MED ORDER — VANCOMYCIN HCL 1000 MG IV SOLR
INTRAVENOUS | Status: DC | PRN
  Administered 2023-03-29: 1000 mg

## 2023-03-29 MED ORDER — ENSURE ENLIVE PO LIQD
237.0000 mL | Freq: Two times a day (BID) | ORAL | Status: DC
  Administered 2023-03-30 – 2023-04-01 (×5): 237 mL via ORAL

## 2023-03-29 MED ORDER — ONDANSETRON HCL 4 MG/2ML IJ SOLN
INTRAMUSCULAR | Status: DC | PRN
  Administered 2023-03-29: 4 mg via INTRAVENOUS

## 2023-03-29 MED ORDER — VASOPRESSIN 20 UNIT/ML IV SOLN
INTRAVENOUS | Status: AC
  Filled 2023-03-29: qty 1

## 2023-03-29 MED ORDER — DEXAMETHASONE SODIUM PHOSPHATE 10 MG/ML IJ SOLN
INTRAMUSCULAR | Status: AC
  Filled 2023-03-29: qty 1

## 2023-03-29 MED ORDER — ROCURONIUM BROMIDE 10 MG/ML (PF) SYRINGE
PREFILLED_SYRINGE | INTRAVENOUS | Status: AC
  Filled 2023-03-29: qty 10

## 2023-03-29 MED ORDER — ACETAMINOPHEN 10 MG/ML IV SOLN: 1000.0000 mg | Freq: Once | INTRAVENOUS | Status: DC | PRN

## 2023-03-29 MED ORDER — SUGAMMADEX SODIUM 200 MG/2ML IV SOLN
INTRAVENOUS | Status: DC | PRN
  Administered 2023-03-29: 200 mg via INTRAVENOUS

## 2023-03-29 MED ORDER — LACTATED RINGERS IV SOLN: INTRAVENOUS | Status: DC

## 2023-03-29 MED ORDER — LIDOCAINE 2% (20 MG/ML) 5 ML SYRINGE
INTRAMUSCULAR | Status: DC | PRN
  Administered 2023-03-29: 60 mg via INTRAVENOUS

## 2023-03-29 MED ORDER — ORAL CARE MOUTH RINSE: 15.0000 mL | Freq: Once | OROMUCOSAL | Status: AC

## 2023-03-29 SURGICAL SUPPLY — 75 items
ANCHOR CORKSCREW 3.5 FIBERWIRE (Anchor) IMPLANT
APPLIER CLIP 11 MED OPEN (CLIP) ×1
APR CLP MED 11 20 MLT OPN (CLIP) ×1
BAG COUNTER SPONGE SURGICOUNT (BAG) ×1 IMPLANT
BAG SPNG CNTER NS LX DISP (BAG) ×1
BIT DRILL SCALD PELVS II 2.5MM (BIT) IMPLANT
BIT DRILL STEP 3.5 (DRILL) IMPLANT
BLADE CLIPPER SURG (BLADE) IMPLANT
BRUSH SCRUB EZ PLAIN DRY (MISCELLANEOUS) ×2 IMPLANT
CLIP APPLIE 11 MED OPEN (CLIP) IMPLANT
COVER SURGICAL LIGHT HANDLE (MISCELLANEOUS) ×1 IMPLANT
DRAPE C-ARM 42X72 X-RAY (DRAPES) ×1 IMPLANT
DRAPE C-ARMOR (DRAPES) ×1 IMPLANT
DRAPE INCISE IOBAN 66X45 STRL (DRAPES) ×1 IMPLANT
DRAPE INCISE IOBAN 85X60 (DRAPES) ×1 IMPLANT
DRAPE ORTHO SPLIT 77X108 STRL (DRAPES) ×2
DRAPE SURG ORHT 6 SPLT 77X108 (DRAPES) ×2 IMPLANT
DRAPE U-SHAPE 47X51 STRL (DRAPES) ×1 IMPLANT
DRILL SCALED PELVIS II 2.5MM (BIT) ×1
DRILL STEP 3.5 (DRILL)
DRSG AQUACEL AG ADV 3.5X10 (GAUZE/BANDAGES/DRESSINGS) IMPLANT
DRSG MEPILEX POST OP 4X12 (GAUZE/BANDAGES/DRESSINGS) IMPLANT
DRSG MEPILEX POST OP 4X8 (GAUZE/BANDAGES/DRESSINGS) IMPLANT
ELECT BLADE 6.5 EXT (BLADE) ×1 IMPLANT
ELECT REM PT RETURN 9FT ADLT (ELECTROSURGICAL) ×1
ELECTRODE REM PT RTRN 9FT ADLT (ELECTROSURGICAL) ×1 IMPLANT
GLOVE BIO SURGEON STRL SZ7.5 (GLOVE) ×1 IMPLANT
GLOVE BIO SURGEON STRL SZ8 (GLOVE) ×1 IMPLANT
GLOVE BIOGEL PI IND STRL 7.5 (GLOVE) ×1 IMPLANT
GLOVE BIOGEL PI IND STRL 8 (GLOVE) ×1 IMPLANT
GLOVE SURG ORTHO LTX SZ7.5 (GLOVE) ×2 IMPLANT
GOWN STRL REUS W/ TWL LRG LVL3 (GOWN DISPOSABLE) ×2 IMPLANT
GOWN STRL REUS W/ TWL XL LVL3 (GOWN DISPOSABLE) ×2 IMPLANT
GOWN STRL REUS W/TWL LRG LVL3 (GOWN DISPOSABLE) ×2
GOWN STRL REUS W/TWL XL LVL3 (GOWN DISPOSABLE) ×2
HANDPIECE INTERPULSE COAX TIP (DISPOSABLE) ×1
KIT BASIN OR (CUSTOM PROCEDURE TRAY) ×1 IMPLANT
KIT TURNOVER KIT B (KITS) ×1 IMPLANT
MANIFOLD NEPTUNE II (INSTRUMENTS) ×1 IMPLANT
NS IRRIG 1000ML POUR BTL (IV SOLUTION) ×1 IMPLANT
PACK TOTAL JOINT (CUSTOM PROCEDURE TRAY) ×1 IMPLANT
PAD ARMBOARD 7.5X6 YLW CONV (MISCELLANEOUS) ×2 IMPLANT
PLATE SUPRAPECTINEAL PELVIS (Plate) IMPLANT
RETRACTOR ONETRAX LX 90X20 (MISCELLANEOUS) IMPLANT
RETRIEVER SUT HEWSON (MISCELLANEOUS) ×1 IMPLANT
SCREW 3.5X46MM (Screw) IMPLANT
SCREW CORTEX ST MATTA 3.5X28MM (Screw) IMPLANT
SCREW CORTEX ST MATTA 3.5X30MM (Screw) IMPLANT
SCREW CORTEX ST MATTA 3.5X32MM (Screw) IMPLANT
SCREW CORTEX ST MATTA 3.5X34MM (Screw) IMPLANT
SCREW CORTEX ST MATTA 3.5X36MM (Screw) IMPLANT
SCREW CORTEX ST MATTA 3.5X38M (Screw) IMPLANT
SCREW CORTEX ST MATTA 3.5X40MM (Screw) IMPLANT
SCREW CORTEX ST MATTA 3.5X65MM (Screw) IMPLANT
SET HNDPC FAN SPRY TIP SCT (DISPOSABLE) ×1 IMPLANT
SPONGE T-LAP 18X18 ~~LOC~~+RFID (SPONGE) IMPLANT
STAPLER VISISTAT 35W (STAPLE) ×1 IMPLANT
STRIP CLOSURE SKIN 1/2X4 (GAUZE/BANDAGES/DRESSINGS) ×1 IMPLANT
SUCTION FRAZIER HANDLE 10FR (MISCELLANEOUS) ×1
SUCTION TUBE FRAZIER 10FR DISP (MISCELLANEOUS) ×1 IMPLANT
SUT ETHILON 2 0 PSLX (SUTURE) ×2 IMPLANT
SUT FIBERWIRE #2 38 T-5 BLUE (SUTURE)
SUT VIC AB 0 CT1 27 (SUTURE) ×1
SUT VIC AB 0 CT1 27XBRD ANBCTR (SUTURE) ×1 IMPLANT
SUT VIC AB 1 CT1 18XCR BRD 8 (SUTURE) ×1 IMPLANT
SUT VIC AB 1 CT1 27 (SUTURE) ×1
SUT VIC AB 1 CT1 27XBRD ANBCTR (SUTURE) ×1 IMPLANT
SUT VIC AB 1 CT1 8-18 (SUTURE) ×1
SUT VIC AB 2-0 CT1 27 (SUTURE) ×1
SUT VIC AB 2-0 CT1 TAPERPNT 27 (SUTURE) ×1 IMPLANT
SUTURE FIBERWR #2 38 T-5 BLUE (SUTURE) ×2 IMPLANT
TOWEL GREEN STERILE (TOWEL DISPOSABLE) ×2 IMPLANT
TOWEL GREEN STERILE FF (TOWEL DISPOSABLE) ×2 IMPLANT
TRAY FOLEY MTR SLVR 16FR STAT (SET/KITS/TRAYS/PACK) IMPLANT
WATER STERILE IRR 1000ML POUR (IV SOLUTION) IMPLANT

## 2023-03-29 NOTE — Progress Notes (Signed)
PROGRESS NOTE    [name] [facility]  BJY:[id number] DOB: 1932/12/27 DOA: 03/28/2023 PCP: [name], MD   Chief Complaint  Patient presents with   Fall    Brief Narrative:  Patient 89 year old gentleman history of CAD, hypertension, hyperlipidemia, OSA on CPAP, A-fib on Eliquis, dementia, rheumatoid arthritis presented with a 4.  Imaging done with concerns for a right acetabular and pelvic fracture.  Orthopedics consulted.  Patient went to the OR 03/29/2023 for ORIF of acetabular fracture.   Assessment & Plan:   Principal Problem:   Acetabular fracture (HCC) Active Problems:   Mixed hyperlipidemia   Rheumatoid arthritis (HCC)   Hypertension   CAD (coronary artery disease)   Paroxysmal atrial fibrillation (HCC)   OSA (obstructive sleep apnea)   Alzheimer's dementia (HCC)   Closed stable fracture of multiple pubic rami (HCC)   DNR (do not resuscitate)   #1 right acetabular/pubic rami fractures -Secondary to mechanical fall with patient falling out of bed resulting right acetabular and pubic rami fractures per imaging. -Preoperatively patient status post fascia iliacus block ordered per anesthesia. -Patient seen in consultation by orthopedics and patient underwent ORIF right acetabular fracture 03/29/2023. -Postop DVT prophylaxis per orthopedics. -Pain management per orthopedics. -PT/OT. -Likely needs SNF placement. -Per orthopedics.  2.  Dementia -Per admitting physician patient with history of behavioral disturbance. -Delirium precautions. -Continue home regimen Depakote, Aricept, Seroquel.  3.  CAD status post DES 2015 -Stable. -Continue home regimen Lopressor, correct*.  4.  Hyperlipidemia -Statin.  5.  Hypertension -Continue Lopressor.  6.  OSA -CPAP nightly.  7.  Paroxysmal atrial fibrillation -Continue metoprolol for rate control. -Eliquis held preoperatively.  Orthopedics to advise when Eliquis may be resumed.  8.  Rheumatoid arthritis -Patient noted  previously on Orencia which has been discontinued. -Outpatient follow-up with Dr. Dierdre Forth.  9.  Dehydration -IV fluids.   DVT prophylaxis: Postop DVT prophylaxis per orthopedics. Code Status: DNR Family Communication: No family at bedside. Disposition: TBD  Status is: Inpatient Remains inpatient appropriate because: Severity of illness   Consultants:  Orthopedics: Dr. Carola Frost 03/28/2023  Procedures:  ORIF right acetabular fracture through Stoppa approach per Dr. Carola Frost 03/29/2023 CT pelvis 03/28/2023 Plain films of the pelvis 03/29/2023 Chest x-ray 03/28/2023 Plain films of the right hip and pelvis 03/28/2023  Antimicrobials:  Anti-infectives (From admission, onward)    Start     Dose/Rate Route Frequency Ordered Stop   03/29/23 1351  vancomycin (VANCOCIN) powder  Status:  Discontinued          As needed 03/29/23 1351 03/29/23 1414   03/29/23 1050  ceFAZolin (ANCEF) 2-4 GM/100ML-% IVPB       Note to Pharmacy: Lurena Nida: cabinet override      03/29/23 1050 03/29/23 2259         Subjective: Patient just returned from the OR.  Alert.  Somewhat confused.  Denies any chest pain or shortness of breath.  No abdominal pain.  Objective: Vitals:   03/29/23 1430 03/29/23 1445 03/29/23 1500 03/29/23 1523  BP: (!) 160/80 (!) 157/76 (!) 139/90 (!) 156/79  Pulse: 72 71 70 71  Resp: 18 18 18    Temp:   98.4 F (36.9 C) 98.5 F (36.9 C)  TempSrc:    Oral  SpO2: 94% 91% 91% 94%  Weight:      Height:        Intake/Output Summary (Last 24 hours) at 03/29/2023 1557 Last data filed at 03/29/2023 1422 Gross per 24 hour  Intake 2165 ml  Output 1765 ml  Net 400 ml   Filed Weights   03/28/23 0713 03/29/23 0958  Weight: 87.7 kg 87.7 kg    Examination:  General exam: Appears calm and comfortable.  Dry mucous membranes. Respiratory system: Clear to auscultation anterior lung fields. Respiratory effort normal. Cardiovascular system: S1 & S2 heard, RRR. No JVD, murmurs, rubs,  gallops or clicks. No pedal edema. Gastrointestinal system: Abdomen is nondistended, soft and nontender. No organomegaly or masses felt. Normal bowel sounds heard. Central nervous system: Alert. No focal neurological deficits. Extremities: Symmetric 5 x 5 power. Skin: No rashes, lesions or ulcers Psychiatry: Judgement and insight appear poor to fair. Mood & affect appropriate.     Data Reviewed: I have personally reviewed following labs and imaging studies  CBC: Recent Labs  Lab 03/28/23 0815 03/29/23 0942  WBC 7.5 6.9  HGB 11.3* 11.2*  HCT 34.5* 34.6*  MCV 96.4 95.1  PLT 270 264    Basic Metabolic Panel: Recent Labs  Lab 03/28/23 0815 03/29/23 0942  NA 137 137  K 3.9 3.6  CL 105 104  CO2 24 25  GLUCOSE 105* 112*  BUN 16 19  CREATININE 1.00 0.91  CALCIUM 8.5* 8.7*  MG  --  2.1    GFR: Estimated Creatinine Clearance: 62.8 mL/min (by C-G formula based on SCr of 0.91 mg/dL).  Liver Function Tests: Recent Labs  Lab 03/28/23 0815  AST 17  ALT 16  ALKPHOS 56  BILITOT 0.6  PROT 6.8  ALBUMIN 3.6    CBG: No results for input(s): "GLUCAP" in the last 168 hours.   Recent Results (from the past 240 hour(s))  Surgical PCR screen     Status: None   Collection Time: 03/28/23  4:29 PM   Specimen: Nasal Mucosa; Nasal Swab  Result Value Ref Range Status   MRSA, PCR NEGATIVE NEGATIVE Final   Staphylococcus aureus NEGATIVE NEGATIVE Final    Comment: (NOTE) The Xpert SA Assay (FDA approved for NASAL specimens in patients 18 years of age and older), is one component of a comprehensive surveillance program. It is not intended to diagnose infection nor to guide or monitor treatment. Performed at [facility], [address] [place], [place] [address]          Radiology Studies: DG C-Arm 1-60 Min-No Report  Result Date: 03/29/2023 Fluoroscopy was utilized by the requesting physician.  No radiographic interpretation.   DG C-Arm 1-60 Min-No  Report  Result Date: 03/29/2023 Fluoroscopy was utilized by the requesting physician.  No radiographic interpretation.   CT PELVIS WO CONTRAST  Result Date: 03/28/2023 CLINICAL DATA:  Hip fracture. EXAM: CT PELVIS WITHOUT CONTRAST TECHNIQUE: Multidetector CT imaging of the pelvis was performed following the standard protocol without intravenous contrast. RADIATION DOSE REDUCTION: This exam was performed according to the departmental dose-optimization program which includes automated exposure control, adjustment of the mA and/or kV according to patient size and/or use of iterative reconstruction technique. COMPARISON:  X-ray earlier 03/28/2023.  CT scan 10/02/2022 FINDINGS: Urinary Tract: Enlarged prostate with mass effect along the base of the bladder. Bladder wall is slightly thickened and trabeculated. Bowel: Few sigmoid colon diverticula identified. Scattered colonic stool. The visualized bowel in the pelvis is nondilated. Normal appendix. Vascular/Lymphatic: Diffuse vascular calcifications identified along the iliac vessels and distal aorta. No discrete abnormal lymph node enlargement identified in the visualized pelvis. Reproductive: Enlarged prostate with mass effect along the base of the bladder. Other: Extraperitoneal hematoma identified along the right hemipelvis. There  is also nonspecific stranding in the presacral space Musculoskeletal: Comminuted fracture involving the right acetabulum superior and medial displacement. Anterior and posterior columns of the acetabulum are involved with the fracture as well. Additional area of injury involves the inferior pubic ramus. This is in 2 areas, midportion and towards the pubic symphysis. No additional areas of fracture or dislocation. Mild degenerative changes seen of the sacroiliac joints, left hip joint. Mild hypertrophic changes of the pubic symphysis. Degenerative changes of the visualized lumbar spine with surgical material along the disc spaces at  L4-5 and L5-S1. IMPRESSION: Comminuted displaced fracture of the right acetabulum. This involves all portions of the acetabular including medial as well as the anterior and posterior columns. Separate fracture of the inferior pubic ramus in 2 areas with 1 extending towards the symphysis. Extraperitoneal hematoma along the right hemipelvis. Electronically Signed   By: Karen Kays M.D.   On: 03/28/2023 09:58   DG Chest 1 View  Result Date: 03/28/2023 CLINICAL DATA:  Right hip fracture EXAM: CHEST  1 VIEW COMPARISON:  Previous studies including the examination of 12/26/2022 FINDINGS: Transient diameter of heart is slightly increased. Thoracic aorta is ectatic. Lung fields are clear of any infiltrates or pulmonary edema. There is no pleural effusion or pneumothorax. Deformities are noted in posterior left fourth, fifth and sixth ribs suggesting old healed fractures. There is previous surgical fusion in cervical spine. IMPRESSION: There are no signs of pulmonary edema or focal pulmonary consolidation. Electronically Signed   By: Ernie Avena M.D.   On: 03/28/2023 08:55   DG Hip Unilat  With Pelvis 2-3 Views Right  Result Date: 03/28/2023 CLINICAL DATA:  Trauma, fall.  Painful to bear weight. EXAM: DG HIP (WITH OR WITHOUT PELVIS) 2-3V RIGHT COMPARISON:  Radiographs dated October 02, 2022 FINDINGS: There is cortical irregularity of the acetabular floor and right pubic rami consistent with pelvic fracture. Mild bilateral hip osteoarthritis. Sacroiliac joints and pubic symphysis appear intact. IMPRESSION: Cortical irregularity of the acetabular floor and right pubic rami consistent with pelvic fracture. CT examination for further evaluation is suggested. Electronically Signed   By: Larose [name] D.O.   On: 03/28/2023 08:48   CT HEAD WO CONTRAST  Result Date: 03/28/2023 CLINICAL DATA:  Trauma EXAM: CT HEAD WITHOUT CONTRAST TECHNIQUE: Contiguous axial images were obtained from the base of the skull through  the vertex without intravenous contrast. RADIATION DOSE REDUCTION: This exam was performed according to the departmental dose-optimization program which includes automated exposure control, adjustment of the mA and/or kV according to patient size and/or use of iterative reconstruction technique. COMPARISON:  10/02/2022 FINDINGS: Brain: No acute intracranial findings are seen. There are no signs of bleeding within the cranium. There is interval resolution of small left subdural hematoma. There is prominence of the third and both lateral ventricles. Cortical sulci are prominent. There is decreased density in periventricular and subcortical white matter. Possible small old lacunar infarcts are seen in basal ganglia. There is low-density in the anterior aspect of pons which may be an artifact or old lacunar infarct. Vascular: Scattered arterial calcifications are seen. Skull: No fracture is seen in calvarium. Sinuses/Orbits: There is mucosal thickening in ethmoid, sphenoid and maxillary sinuses. Other: There is increased amount of CSF in the sella suggesting partial empty sella. IMPRESSION: No acute intracranial findings are seen. There are no signs of bleeding within the cranium. There is no focal mass effect. Atrophy. Small vessel disease. Possible small old lacunar infarct in pons and basal ganglia. Chronic sinusitis.  Electronically Signed   By: Ernie Avena M.D.   On: 03/28/2023 08:20        Scheduled Meds:  divalproex  125 mg Oral Daily   docusate sodium  100 mg Oral BID   donepezil  5 mg Oral QPM   escitalopram  20 mg Oral Daily   [START ON 03/30/2023] feeding supplement  237 mL Oral BID BM   gabapentin  300 mg Oral TID   melatonin  5 mg Oral QHS   metoprolol tartrate  12.5 mg Oral BID   [START ON 03/30/2023] multivitamin with minerals  1 tablet Oral Daily   mupirocin ointment  1 Application Nasal BID   pantoprazole  40 mg Oral Daily   QUEtiapine  25 mg Oral BID   rosuvastatin  5 mg Oral q  morning   Continuous Infusions:  sodium chloride     sodium chloride     ceFAZolin     methocarbamol (ROBAXIN) IV       LOS: 1 day    Time spent: 35 minutes    Ramiro Harvest, MD [facility]   To contact the attending provider between 7A-7P or the covering provider during after hours 7P-7A, please log into the web site [email or web address] and access using universal  password for that web site. If you do not have the password, please call the hospital operator.  03/29/2023, 3:57 PM

## 2023-03-29 NOTE — Anesthesia Postprocedure Evaluation (Signed)
Anesthesia Post Note  Patient: [name] [name]  Procedure(s) Performed: OPEN REDUCTION INTERNAL FIXATION (ORIF) ACETABULAR FRACTURE (Right: Hip)     Patient location during evaluation: PACU Anesthesia Type: General Level of consciousness: awake Pain management: pain level controlled Vital Signs Assessment: post-procedure vital signs reviewed and stable Respiratory status: spontaneous breathing, nonlabored ventilation and respiratory function stable Cardiovascular status: blood pressure returned to baseline and stable Postop Assessment: no apparent nausea or vomiting Anesthetic complications: no   No notable events documented.  Last Vitals:  Vitals:   03/29/23 1500 03/29/23 1523  BP: (!) 139/90 (!) 156/79  Pulse: 70 71  Resp: 18   Temp: 36.9 C 36.9 C  SpO2: 91% 94%    Last Pain:  Vitals:   03/29/23 1523  TempSrc: Oral  PainSc:                  Catheryn Bacon Lashona Schaaf

## 2023-03-29 NOTE — Progress Notes (Signed)
No changes overnight.  Explained the risks and benefits to patient, echoing what I had discussed with both son and daughter last night.  Plan is for ORIF through Stoppa approach.  Myrene Galas, MD [facility], [facility] [phone]

## 2023-03-29 NOTE — Transfer of Care (Signed)
Immediate Anesthesia Transfer of Care Note  Patient: [name] [facility]  Procedure(s) Performed: OPEN REDUCTION INTERNAL FIXATION (ORIF) ACETABULAR FRACTURE (Right: Hip)  Patient Location: PACU  Anesthesia Type:General  Level of Consciousness: drowsy and patient cooperative  Airway & Oxygen Therapy: Patient Spontanous Breathing and Patient connected to nasal cannula oxygen  Post-op Assessment: Report given to RN and Post -op Vital signs reviewed and stable  Post vital signs: Reviewed and stable  Last Vitals:  Vitals Value Taken Time  BP 152/74 03/29/23 1418  Temp    Pulse 69 03/29/23 1422  Resp 16 03/29/23 1422  SpO2 95 % 03/29/23 1422  Vitals shown include unvalidated device data.  Last Pain:  Vitals:   03/29/23 1002  TempSrc:   PainSc: 10-Worst pain ever         Complications: No notable events documented.

## 2023-03-29 NOTE — Op Note (Signed)
03/29/2023  5:08 PM  PATIENT:  [name]  16-Mar-1933 male   MEDICAL RECORD NUMBER: [id number]  PRE-OPERATIVE DIAGNOSIS:  RIGHT ANTERIOR COLUMN POSTERIOR HEMITRANSVERSE ACETABULAR FRACTURE  POST-OPERATIVE DIAGNOSIS:  RIGHT ANTERIOR COLUMN POSTERIOR HEMITRANSVERSE ACETABULAR FRACTURE  PROCEDURE:  OPEN REDUCTION INTERNAL FIXATION OF RIGHT ACETABULAR FRACTURE INVOLVING THE WEIGHT BEARING DOME/ TWO COLUMNS USING ANTERIOR STOPPA APPROACH  SURGEON:  Doralee Albino. Carola Frost, M.D.  ASSISTANT:  Montez Morita, PA-C.  ANESTHESIA:  General.  COMPLICATIONS:  None.  EBL:  700 mL   BLOOD ADMINISTERED:none  DRAINS: none   LOCAL MEDICATIONS USED:  NONE  SPECIMEN:  No Specimen  DISPOSITION OF SPECIMEN:  N/A  COUNTS:  YES  TOURNIQUET:  * No tourniquets in log *`  DICTATION: Note written in EPIC  PLAN OF CARE: Admit to inpatient   PATIENT DISPOSITION:  PACU - hemodynamically stable.   Delay start of Pharmacological VTE agent (>24hrs) due to surgical blood loss or risk of bleeding: no  BRIEF SUMMARY OF INDICATION OF PROCEDURE:  [name] is a very pleasant 89 y.o.-year-old who sustained a right acetabular fracture. After medical evaluation and resuscitation we recommended surgical repair to best restore function. I did discuss with the patient option for nonoperative treatment as well as the risks and benefits of surgery including the potential for blood loss, infection, urologic injury, DVT, PE, heart attack, stroke, death, nerve and vessel injury, infection, and multiple others. We also specifically discussed urologic injury, sexual dysfunction, and possibility of hip arthritis which could necessitate conversion to total hip arthroplasty. These risks were acknowledged and consent provided to proceed.  BRIEF SUMMARY OF PROCEDURE:  The patient was taken to the operating room after administration of antibiotics.  General anesthesia was induced. He was positioned supine with multiple blankets  under the operative knee side to flex the ipsilateral hip to 40 degrees. A chlorhexidine scrub and then full Betadine scrub and paint were then applied to the abdomen and iliac wing areas.  Time-out was held.   A Pfannenstiel incision of 12 cm was made superior to the pelvic brim. Dissection was carried down to the pyramidalis, which was tagged.  A vertical limb was made through the linea alba and retention sutures placed in the distal aspect.  I carefully made my way around the left pelvic brim.  I did not identify any significant disruption of the pubic symphysis.  I did identify the so-called corona mortis vessels bridging the obturator and epigastric systems.  Using several vascular clips, I secured and divided these and continued along the brim with the help of my assistant who used  lighted long blunt Meyerding retractors.  We then were able to encounter the fracture site and associated hematoma, which was cleaned with curettes and lavage.  Using a ball spike pusher, I applied combined lateral and caudal force to the fracture I was unable to produce reduction without additional steps to address the mild protrusio. I applied the suprapectineal plate from [employer], secured fixation anteriorly, and then while reapplying lateral and caudal force to push the fracture into a reduced position using a ball spike pusher, I secured screw fixation into the sciatic buttress of the posterior column and retroacetabular area above the joint line. This reduced the anterior column and the posterior column components. I carefully placed additional screws into the anterior column above below the fracture site and also secured screws into the most inferior portions of the plate adjacent to the ischium and in the sciatic buttress of  the posterior column.   I checked AP and Judet films throughout and final films showed what appeared to be excellent reduction of the articular surface of the posterior column, of the  anterior column, and then also of the iliac wing.  The pelvis was irrigated thoroughly and then closed in standard layered fashion using #1 Vicryl at the pelvic brim, #1 for the deep subcu, 0 Vicryl, 2-0 Vicryl, and 3-0 nylon along the pelvic brim.  We repaired the fascia directly over the iliac crest, and then 0 Vicryl, 2-0 Vicryl, and 3-0 nylon, and then a Mepilex dressing.  A malleable retractor was used to protect the bladder and obturator nerve at all times while in the pelvis.  Montez Morita, PAC, assisted me throughout.  An assistant was absolutely necessary for the safe and effective completion of the case.  The patient was taken to the PACU in stable condition.  There were no complications during the procedure.  PROGNOSIS:  [name] has sustained a complex injury and the repair has restored sufficient congruity and stability to the hip joint to enable bed-to-chair mobilization with partial weightbearing on the left lower extremity for the next 8 weeks. Given his dementia, weight bearing to tolerance is not unanticipated.  Formal pharmacologic DVT prophylaxis will occur with resumption of his Eliquis. There remains increased risk for perioperative complications given associated injuries, comorbidities, and age.  We will continue to follow closely as patient remains on the trauma service primarily.

## 2023-03-29 NOTE — Progress Notes (Signed)
Initial Nutrition Assessment  DOCUMENTATION CODES:   Not applicable  INTERVENTION:  Multivitamin w/ minerals daily Ensure Enlive po BID, each supplement provides 350 kcal and 20 grams of protein. Recommend advancing diet to regular once medically appropriate  NUTRITION DIAGNOSIS:   Increased nutrient needs related to post-op healing as evidenced by estimated needs.  GOAL:   Patient will meet greater than or equal to 90% of their needs  MONITOR:   PO intake, Supplement acceptance, Labs, I & O's  REASON FOR ASSESSMENT:   Consult Hip fracture protocol  ASSESSMENT:   89 y.o. male presented to the ED after a fall. PMH includes CAD, HTN, HLD, GERD, SDH, and Alzheimer's disease. Pt admitted with R acetabular/pubic rami fracture.   Pt off unit at time or RD visit, no family in room. Pt in OR for fracture repair.  RD to order oral nutrition supplements to assist with post-op healing.  Medications reviewed and include: Colace, Melatonin, Protonix Labs reviewed.   NUTRITION - FOCUSED PHYSICAL EXAM:  Deferred to follow-up.   Diet Order:   Diet Order             Diet NPO time specified  Diet effective midnight                   EDUCATION NEEDS:   No education needs have been identified at this time  Skin:  Skin Assessment: Reviewed RN Assessment  Last BM:  Unknown/PTA  Height:  Ht Readings from Last 1 Encounters:  03/29/23 6' (1.829 m)   Weight:  Wt Readings from Last 1 Encounters:  03/29/23 87.7 kg   Ideal Body Weight:  80.9 kg  BMI:  Body mass index is 26.22 kg/m.  Estimated Nutritional Needs:  Kcal:  1950-2150 Protein:  100-120 grams Fluid:  >/= 2 L   Kirby Crigler RD, LDN Clinical Dietitian See [facility] for contact information.

## 2023-03-29 NOTE — Anesthesia Procedure Notes (Signed)
Procedure Name: Intubation Date/Time: 03/29/2023 11:32 AM  Performed by: Adria Dill, CRNAPre-anesthesia Checklist: Patient identified, Emergency Drugs available, Suction available and Patient being monitored Patient Re-evaluated:Patient Re-evaluated prior to induction Oxygen Delivery Method: Circle system utilized Preoxygenation: Pre-oxygenation with 100% oxygen Induction Type: IV induction Ventilation: Mask ventilation without difficulty Laryngoscope Size: Miller and 3 Grade View: Grade II Tube type: Oral Tube size: 7.5 mm Number of attempts: 1 Airway Equipment and Method: Stylet and Oral airway Placement Confirmation: ETT inserted through vocal cords under direct vision, positive ETCO2 and breath sounds checked- equal and bilateral Secured at: 22 cm Tube secured with: Tape Dental Injury: Teeth and Oropharynx as per pre-operative assessment

## 2023-03-29 NOTE — Anesthesia Preprocedure Evaluation (Addendum)
Anesthesia Evaluation  Patient identified by MRN, date of birth, ID band Patient confused    Reviewed: Allergy & Precautions, NPO status , Patient's Chart, lab work & pertinent test results, reviewed documented beta blocker date and time   Airway Mallampati: III  TM Distance: >3 FB Neck ROM: Full    Dental no notable dental hx.    Pulmonary asthma , sleep apnea and Continuous Positive Airway Pressure Ventilation , former smoker   Pulmonary exam normal        Cardiovascular hypertension, Pt. on home beta blockers + CAD, + Past MI and + Cardiac Stents  Normal cardiovascular exam+ dysrhythmias Atrial Fibrillation   Echo: - Left ventricle: The cavity size was normal. Wall thickness was    increased in a pattern of mild LVH. Systolic function was normal.    The estimated ejection fraction was in the range of 50% to 55%.    Wall motion was normal; there were no regional wall motion    abnormalities. Doppler parameters are consistent with abnormal    left ventricular relaxation (grade 1 diastolic dysfunction).  - Aortic valve: Mildly calcified annulus. Moderately thickened,    moderately calcified leaflets.   Myo Perfusion:  Nuclear stress EF: 67%. No wall motion abnormalities  There was no ST segment deviation noted during stress.  This is a low risk study. No evidence of infarct or ischemia. No evidence of inferior wall ischemia, prior RCA STEMI with stent placement. Residual LAD stenosis, no evidence of anterior wall ischemia.  The study is normal.    Neuro/Psych  Headaches PSYCHIATRIC DISORDERS  Depression   Dementia    GI/Hepatic Neg liver ROS,GERD  Medicated and Controlled,,  Endo/Other  negative endocrine ROS    Renal/GU Renal disease     Musculoskeletal  (+) Arthritis , Rheumatoid disorders,    Abdominal   Peds  Hematology  (+) Blood dyscrasia (Eliquis), anemia   Anesthesia Other Findings Acetabular  fracture  Reproductive/Obstetrics                             Anesthesia Physical Anesthesia Plan  ASA: 3  Anesthesia Plan: General   Post-op Pain Management: Tylenol PO (pre-op)*   Induction: Intravenous  PONV Risk Score and Plan: 2 and Ondansetron, Treatment may vary due to age or medical condition and Dexamethasone  Airway Management Planned: Oral ETT  Additional Equipment: None  Intra-op Plan:   Post-operative Plan: Extubation in OR  Informed Consent: I have reviewed the patients History and Physical, chart, labs and discussed the procedure including the risks, benefits and alternatives for the proposed anesthesia with the patient or authorized representative who has indicated his/her understanding and acceptance.   Patient has DNR.  Discussed DNR with power of attorney and Suspend DNR.   Dental advisory given  Plan Discussed with: CRNA  Anesthesia Plan Comments:        Anesthesia Quick Evaluation

## 2023-03-30 DIAGNOSIS — S32401K Unspecified fracture of right acetabulum, subsequent encounter for fracture with nonunion: Secondary | ICD-10-CM | POA: Diagnosis not present

## 2023-03-30 LAB — BASIC METABOLIC PANEL
Anion gap: 8 (ref 5–15)
BUN: 16 mg/dL (ref 8–23)
CO2: 25 mmol/L (ref 22–32)
Calcium: 8.2 mg/dL — ABNORMAL LOW (ref 8.9–10.3)
Chloride: 104 mmol/L (ref 98–111)
Creatinine, Ser: 0.88 mg/dL (ref 0.61–1.24)
GFR, Estimated: >60 mL/min (ref >60)
Glucose, Bld: 148 mg/dL — ABNORMAL HIGH (ref 70–99)
Potassium: 4 mmol/L (ref 3.5–5.1)
Sodium: 137 mmol/L (ref 135–145)

## 2023-03-30 LAB — CBC
HCT: 28.1 % — ABNORMAL LOW (ref 39.0–52.0)
Hemoglobin: 9.4 g/dL — ABNORMAL LOW (ref 13.0–17.0)
MCH: 31.8 pg (ref 26.0–34.0)
MCHC: 33.5 g/dL (ref 30.0–36.0)
MCV: 94.9 fL (ref 80.0–100.0)
Platelets: 197 10*3/uL (ref 150–400)
RBC: 2.96 MIL/uL — ABNORMAL LOW (ref 4.22–5.81)
RDW: 14.6 % (ref 11.5–15.5)
WBC: 7.5 10*3/uL (ref 4.0–10.5)
nRBC: 0 % (ref 0.0–0.2)

## 2023-03-30 LAB — TYPE AND SCREEN

## 2023-03-30 LAB — POCT I-STAT, CHEM 8
BUN: 29 mg/dL — ABNORMAL HIGH (ref 8–23)
Calcium, Ion: 1.09 mmol/L — ABNORMAL LOW (ref 1.15–1.40)
Chloride: 103 mmol/L (ref 98–111)
Creatinine, Ser: 0.8 mg/dL (ref 0.61–1.24)
Glucose, Bld: 137 mg/dL — ABNORMAL HIGH (ref 70–99)
HCT: 27 % — ABNORMAL LOW (ref 39.0–52.0)
Hemoglobin: 9.2 g/dL — ABNORMAL LOW (ref 13.0–17.0)
Potassium: 6 mmol/L — ABNORMAL HIGH (ref 3.5–5.1)
Sodium: 137 mmol/L (ref 135–145)
TCO2: 25 mmol/L (ref 22–32)

## 2023-03-30 LAB — BPAM RBC: Blood Product Expiration Date: 202406072359

## 2023-03-30 LAB — MAGNESIUM: Magnesium: 2 mg/dL (ref 1.7–2.4)

## 2023-03-30 MED ORDER — CHLORHEXIDINE GLUCONATE CLOTH 2 % EX PADS
6.0000 | MEDICATED_PAD | Freq: Every day | CUTANEOUS | Status: DC
  Administered 2023-03-30 – 2023-04-01 (×3): 6 via TOPICAL

## 2023-03-30 MED ORDER — APIXABAN 5 MG PO TABS
5.0000 mg | ORAL_TABLET | Freq: Two times a day (BID) | ORAL | Status: DC
  Administered 2023-03-30 – 2023-04-01 (×5): 5 mg via ORAL
  Filled 2023-03-30 (×5): qty 1

## 2023-03-30 NOTE — Progress Notes (Signed)
Orthopaedic Trauma Service Progress Note  Patient ID: [name] MRN: [id number] DOB/AGE: 1933-10-15 89 y.o.  Subjective:  Doing ok  Reports pain in R hip  Knows he had surgery yesterday    ROS As above  Objective:   VITALS:   Vitals:   03/29/23 2146 03/30/23 0014 03/30/23 0420 03/30/23 0806  BP: 135/76 116/64 (!) 148/75 (!) 156/73  Pulse: 69 60 62 64  Resp: 20 20 17 16   Temp: 99 F (37.2 C) 98.9 F (37.2 C) 98.9 F (37.2 C) (!) 97.5 F (36.4 C)  TempSrc: Oral Oral Oral Oral  SpO2: 95% 95% 95% 96%  Weight:      Height:        Estimated body mass index is 26.22 kg/m as calculated from the following:   Height as of this encounter: 6' (1.829 m).   Weight as of this encounter: 87.7 kg.   Intake/Output      05/14 0701 05/15 0700 05/15 0701 05/16 0700   P.O. 360    I.V. (mL/kg) 1743.4 (19.9)    Blood 315    IV Piggyback 950    Total Intake(mL/kg) 3368.4 (38.4)    Urine (mL/kg/hr) 1865 (0.9)    Blood 1000    Total Output 2865    Net +503.4           LABS  Results for orders placed or performed during the hospital encounter of 03/28/23 (from the past 24 hour(s))  CBC     Status: Abnormal   Collection Time: 03/29/23  9:42 AM  Result Value Ref Range   WBC 6.9 4.0 - 10.5 K/uL   RBC 3.64 (L) 4.22 - 5.81 MIL/uL   Hemoglobin 11.2 (L) 13.0 - 17.0 g/dL   HCT 40.9 (L) 81.1 - 91.4 %   MCV 95.1 80.0 - 100.0 fL   MCH 30.8 26.0 - 34.0 pg   MCHC 32.4 30.0 - 36.0 g/dL   RDW 78.2 95.6 - 21.3 %   Platelets 264 150 - 400 K/uL   nRBC 0.0 0.0 - 0.2 %  Basic metabolic panel     Status: Abnormal   Collection Time: 03/29/23  9:42 AM  Result Value Ref Range   Sodium 137 135 - 145 mmol/L   Potassium 3.6 3.5 - 5.1 mmol/L   Chloride 104 98 - 111 mmol/L   CO2 25 22 - 32 mmol/L   Glucose, Bld 112 (H) 70 - 99 mg/dL   BUN 19 8 - 23 mg/dL   Creatinine, Ser 0.86 0.61 - 1.24 mg/dL   Calcium 8.7 (L)  8.9 - 10.3 mg/dL   GFR, Estimated >57 >84 mL/min   Anion gap 8 5 - 15  Magnesium     Status: None   Collection Time: 03/29/23  9:42 AM  Result Value Ref Range   Magnesium 2.1 1.7 - 2.4 mg/dL  Prepare RBC (crossmatch)     Status: None   Collection Time: 03/29/23  1:21 PM  Result Value Ref Range   Order Confirmation      ORDER PROCESSED BY BLOOD BANK Performed at [facility], [address], [place], [place] [address]   CBC     Status: Abnormal   Collection Time: 03/30/23  6:33 AM  Result Value Ref Range   WBC 7.5 4.0 -  10.5 K/uL   RBC 2.96 (L) 4.22 - 5.81 MIL/uL   Hemoglobin 9.4 (L) 13.0 - 17.0 g/dL   HCT 65.7 (L) 84.6 - 96.2 %   MCV 94.9 80.0 - 100.0 fL   MCH 31.8 26.0 - 34.0 pg   MCHC 33.5 30.0 - 36.0 g/dL   RDW 95.2 84.1 - 32.4 %   Platelets 197 150 - 400 K/uL   nRBC 0.0 0.0 - 0.2 %  Basic metabolic panel     Status: Abnormal   Collection Time: 03/30/23  6:33 AM  Result Value Ref Range   Sodium 137 135 - 145 mmol/L   Potassium 4.0 3.5 - 5.1 mmol/L   Chloride 104 98 - 111 mmol/L   CO2 25 22 - 32 mmol/L   Glucose, Bld 148 (H) 70 - 99 mg/dL   BUN 16 8 - 23 mg/dL   Creatinine, Ser 4.01 0.61 - 1.24 mg/dL   Calcium 8.2 (L) 8.9 - 10.3 mg/dL   GFR, Estimated >02 >72 mL/min   Anion gap 8 5 - 15  Magnesium     Status: None   Collection Time: 03/30/23  6:33 AM  Result Value Ref Range   Magnesium 2.0 1.7 - 2.4 mg/dL     PHYSICAL EXAM:   Gen: resting comfortably in bed, pleasant, does not appear to be in any distress  Lungs: clear anterior fields  Cardiac: s1 and s2 Abd: + BS, NTND  Pelvis:  dressing over pfannenstiel incision is clean and dry   Obturator sensation appears to be intact on the right   Motor and sensory functions are o/w intact on the right leg  Ext warm   + DP pulse  No DCT  Good perfusion distally    Assessment/Plan: 1 Day Post-Op     Anti-infectives (From admission, onward)    Start     Dose/Rate Route Frequency Ordered Stop    03/29/23 1730  ceFAZolin (ANCEF) IVPB 2g/100 mL premix        2 g 200 mL/hr over 30 Minutes Intravenous Every 8 hours 03/29/23 1640 03/30/23 0539   03/29/23 1351  vancomycin (VANCOCIN) powder  Status:  Discontinued          As needed 03/29/23 1351 03/29/23 1414   03/29/23 1050  ceFAZolin (ANCEF) 2-4 GM/100ML-% IVPB       Note to Pharmacy: [facility], [place]: cabinet override      03/29/23 1050 03/29/23 2259     .  POD/HD#: 1  89 y/o male s/p fall with fragility fracture of R acetabulum   -fall  - R anterior column posterior hemitransverse acetabulum fracture s/p ORIF via intrapelvic (stoppa) approach   Weightbearing TDWB R leg x 6-8 weeks Really bed to chair only given dementia    ROM/Activity   No ROM restrictions but bed to chair only    Wound care   Daily dressing changes as needed starting on 04/01/2023   Sutures out about 2 weeks from date of surgery   - Pain management:  Multimodal   Minimize narcotics  - ABL anemia/Hemodynamics  Monitor   Currently stable  Did receive 1 unit PRBC in OR and TXA   - Medical issues   Per primary   - DVT/PE prophylaxis:  Resume home eliquis   - ID:   Periop abx   - Metabolic Bone Disease:  Fracture is a fragility fracture  Vitamin D levels pending   Optimize nutrition   - Activity:  As above  -  Impediments to fracture healing:  Age   Bone quality   Ability to maintain compliance with post op restrictions   - Dispo:  Therapy evals  TOC consult      Mearl Latin, PA-C [phone] (C) 03/30/2023, 9:33 AM  [facility] [address] [place] [address] [phone] [phone] (F)    After 5pm and on the weekends please log on to Amion, go to orthopaedics and the look under the [facility] Call for the provider(s) on call. You can also call our office at [phone] and then follow the prompts to be connected to the call team.  Patient ID: [name], male   DOB:  Nov 25, 1932, 89 y.o.   MRN: [id number]

## 2023-03-30 NOTE — Evaluation (Signed)
Physical Therapy Evaluation Patient Details Name: [name] MRN: [id number] DOB: April 18, 1933 Today's Date: 03/30/2023  History of Present Illness  Patient is a 89 year old male who fell out of bed with immediate pain to right hip. Found to have right acetabular fracture s/p ORIF. History of dementia, CAD, HTN, HLD, OSA   Clinical Impression  Patient agreeable to PT. He is confused but cooperative during session. He reports he is independent with mobility at baseline and chart indicates patient lives in a Memory care unit.  The patient reports mild to moderate pain in the right hip area that is worse with movement. He required +2 person assistance for bed mobility and to stand from bed with elevated bed height. Frequent cues for technique to maintain weight bearing precautions with transfers and while standing. Ambulation not attempted. Recommend to continue PT to maximize independence and to decrease caregiver burden. Anticipate the need for frequent assistance required with mobility at discharge.      Recommendations for follow up therapy are one component of a multi-disciplinary discharge planning process, led by the attending physician.  Recommendations may be updated based on patient status, additional functional criteria and insurance authorization.  Follow Up Recommendations Can patient physically be transported by private vehicle: No     Assistance Recommended at Discharge Frequent or constant Supervision/Assistance  Patient can return home with the following  Two people to help with walking and/or transfers;A lot of help with bathing/dressing/bathroom;Assist for transportation;Help with stairs or ramp for entrance;Assistance with cooking/housework;Direct supervision/assist for medications management;Direct supervision/assist for financial management    Equipment Recommendations None recommended by PT (to be determined at next level of care)  Recommendations for Other Services        Functional Status Assessment Patient has had a recent decline in their functional status and demonstrates the ability to make significant improvements in function in a reasonable and predictable amount of time.     Precautions / Restrictions Precautions Precautions: Fall Restrictions Weight Bearing Restrictions: Yes RLE Weight Bearing: Touchdown weight bearing      Mobility  Bed Mobility Overal bed mobility: Needs Assistance Bed Mobility: Supine to Sit, Sit to Supine     Supine to sit: Max assist, +2 for physical assistance Sit to supine: Max assist, +2 for physical assistance   General bed mobility comments: verbal cues for task initiation and sequencing    Transfers Overall transfer level: Needs assistance Equipment used: Rolling walker (2 wheels) Transfers: Sit to/from Stand Sit to Stand: Max assist, +2 physical assistance, From elevated surface           General transfer comment: lifting assistance required, assist for keeping right knee extended to facilitate weight accptance on LLE with standing    Ambulation/Gait               General Gait Details: not attempted due to weight bearing restrictions and poor standing tolerance/balance  Stairs            Wheelchair Mobility    Modified Rankin (Stroke Patients Only)       Balance Overall balance assessment: Needs assistance Sitting-balance support: Feet supported Sitting balance-Leahy Scale: Fair     Standing balance support: Bilateral upper extremity supported Standing balance-Leahy Scale: Poor Standing balance comment: external support required to maintain standing balance with heavy reliance on RW for support. cues for off loading RLE in order to maintain weight bearing status  Pertinent Vitals/Pain Pain Assessment Pain Assessment: Faces Faces Pain Scale: Hurts little more Pain Location: R hip with movement Pain Descriptors / Indicators:  Discomfort, Grimacing Pain Intervention(s): Limited activity within patient's tolerance, Monitored during session, Repositioned, Ice applied    Home Living Family/patient expects to be discharged to:: Other (Comment) (Memory Care)                        Prior Function Prior Level of Function : Independent/Modified Independent             Mobility Comments: independent with mobility. History of falls ADLs Comments: patient reports he has assistance with meals and medications (he reports his spouse helps him)     Hand Dominance        Extremity/Trunk Assessment   Upper Extremity Assessment Upper Extremity Assessment: Overall WFL for tasks assessed    Lower Extremity Assessment Lower Extremity Assessment: RLE deficits/detail RLE Deficits / Details: pain with movement of R hip. patient able to activate hip, knee, ankle movement in gravity eliminated position       Communication   Communication: HOH  Cognition Arousal/Alertness: Awake/alert Behavior During Therapy: WFL for tasks assessed/performed Overall Cognitive Status: History of cognitive impairments - at baseline                                 General Comments: Patient is oriented to self. He was not aware that he is in the hospital but can recall after being re-oriented. Disoriented to time and situation. He is able to follow single step commands consistently. Pleasant overall with history of dementia.        General Comments      Exercises     Assessment/Plan    PT Assessment Patient needs continued PT services  PT Problem List Decreased strength;Decreased range of motion;Decreased activity tolerance;Decreased balance;Decreased mobility;Decreased cognition;Decreased knowledge of precautions;Decreased safety awareness;Decreased knowledge of use of DME;Pain       PT Treatment Interventions DME instruction;Gait training;Stair training;Functional mobility training;Therapeutic  activities;Therapeutic exercise;Balance training;Neuromuscular re-education;Cognitive remediation;Patient/family education;Wheelchair mobility training    PT Goals (Current goals can be found in the Care Plan section)  Acute Rehab PT Goals Patient Stated Goal: to have less pain PT Goal Formulation: With patient Time For Goal Achievement: 04/13/23 Potential to Achieve Goals: Good    Frequency Min 3X/week     Co-evaluation PT/OT/SLP Co-Evaluation/Treatment: Yes Reason for Co-Treatment: Complexity of the patient's impairments (multi-system involvement);To address functional/ADL transfers;Necessary to address cognition/behavior during functional activity PT goals addressed during session: Mobility/safety with mobility         AM-PAC PT "6 Clicks" Mobility  Outcome Measure Help needed turning from your back to your side while in a flat bed without using bedrails?: A Lot Help needed moving from lying on your back to sitting on the side of a flat bed without using bedrails?: A Lot Help needed moving to and from a bed to a chair (including a wheelchair)?: Total Help needed standing up from a chair using your arms (e.g., wheelchair or bedside chair)?: Total Help needed to walk in hospital room?: Total Help needed climbing 3-5 steps with a railing? : Total 6 Click Score: 8    End of Session Equipment Utilized During Treatment: Gait belt Activity Tolerance: Patient tolerated treatment well;Patient limited by fatigue Patient left: in bed;with call bell/phone within reach;with bed alarm set Nurse Communication: Mobility status PT Visit  Diagnosis: Other abnormalities of gait and mobility (R26.89);Difficulty in walking, not elsewhere classified (R26.2)    Time: 0827-0900 PT Time Calculation (min) (ACUTE ONLY): 33 min   Charges:   PT Evaluation $PT Eval Low Complexity: 1 Low PT Treatments $Therapeutic Activity: 8-22 mins        Donna Bernard, PT, MPT   Ina Homes 03/30/2023, 11:28 AM

## 2023-03-30 NOTE — NC FL2 (Addendum)
[place] MEDICAID FL2 LEVEL OF CARE FORM     IDENTIFICATION  Patient Name: [name] Birthdate: 16-Sep-1933 Sex: male Admission Date (Current Location): 03/28/2023  [facility] and [place] Number:  Producer, television/film/video and Address:  [facility] [place], [address] [place], [place] [address]      Provider Number: [id number]  Attending Physician Name and Address:  Rolly Salter, MD  Relative Name and Phone Number:  Mosses, Mcdill [phone]    Current Level of Care: Hospital Recommended Level of Care: Memory Care Prior Approval Number:    Date Approved/Denied:   PASRR Number:    Discharge Plan: Other (Comment) [facility])    Current Diagnoses: Patient Active Problem List   Diagnosis Date Noted   Acetabular fracture (HCC) 03/28/2023   Closed stable fracture of multiple pubic rami (HCC) 03/28/2023   DNR (do not resuscitate) 03/28/2023   Subdural hematoma (HCC) 10/02/2022   Accidental fall 07/29/2022   DDD (degenerative disc disease), cervical 01/19/2022   Knee pain, left 01/19/2022   Dizzinesses 12/02/2021   Atherosclerosis of aorta (HCC) 03/02/2021   COVID-19 virus infection 11/15/2020   Weight loss 09/29/2020   Near syncope 09/04/2020   Chest pain    Dyspepsia    Encounter for medication management 11/14/2019   Angular stomatitis 07/09/2019   Cervical radiculopathy 06/05/2019   Pain in joint of left shoulder 05/31/2019   Concussion 04/17/2019   Hemoptysis 04/05/2019   Confusion 04/05/2019   Alzheimer's dementia (HCC) 12/21/2018   Nausea & vomiting 02/07/2018   Chills 02/07/2018   Rash 02/07/2018   Insomnia 09/22/2017   Cervical spondylolysis 06/28/2017   Hyperglycemia 06/22/2017   Well adult exam 11/18/2016   Hydronephrosis 11/18/2016   UPJ obstruction, 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   Allergic rhinitis 06/02/2016   Asthma with exacerbation 06/02/2016   Sinusitis, chronic 10/17/2015   OSA (obstructive sleep apnea)  06/19/2015   Cellulitis and abscess of leg, except foot 05/26/2015   Paroxysmal atrial fibrillation (HCC) 02/21/2015   Upper respiratory infection 12/02/2014   Vivid dream 09/25/2014   Actinic keratoses 04/26/2014   CAD (coronary artery disease) 02/25/2014   STEMI (ST elevation myocardial infarction) (HCC) 01/08/2014   Rheumatoid bursitis of left elbow (HCC) 08/08/2013   Situational depression 06/18/2013   Neck pain 04/23/2012   Acute abdominal pain in left flank 02/14/2012   Hypertension 09/23/2011   Chest pain, midsternal 09/13/2011   Mixed hyperlipidemia 07/23/2010   Palpitations 07/07/2010   Rheumatoid arthritis (HCC) 06/04/2010   PARESTHESIA 06/04/2010   Pain in joint 11/27/2009   MYALGIA 08/21/2009   ALLERGIC RHINITIS 06/11/2008   OSTEOARTHRITIS 06/11/2008   FATIGUE 06/11/2008   LOW BACK PAIN 07/27/2007   COLONIC POLYPS, HX OF 07/27/2007    Orientation RESPIRATION BLADDER Height & Weight     Self, Situation  O2 Incontinent, Indwelling catheter Weight: 193 lb 5.5 oz (87.7 kg) Height:  6' (182.9 cm)  BEHAVIORAL SYMPTOMS/MOOD NEUROLOGICAL BOWEL NUTRITION STATUS      Continent Diet (regular diet)  AMBULATORY STATUS COMMUNICATION OF NEEDS Skin   Total Care Verbally Surgical wounds                       Personal Care Assistance Level of Assistance  Bathing, Feeding, Dressing, Total care Bathing Assistance: Maximum assistance Feeding assistance: Limited assistance Dressing Assistance: Maximum assistance Total Care Assistance: Maximum assistance   Functional Limitations Info  Sight, Hearing, Speech Sight Info: Adequate Hearing Info: Impaired Speech  Info: Adequate    SPECIAL CARE FACTORS FREQUENCY  PT (By licensed PT), OT (By licensed OT)     PT Frequency: evaluate and treat OT Frequency: evaluate and treat            Contractures Contractures Info: Not present    Additional Factors Info  Code Status, Allergies Code Status Info: DNR Allergies Info:  Methotrexate, Aricept (Donepezil Hcl), Crestor (Rosuvastatin Calcium), Lisinopril, Namenda (Memantine), Atorvastatin, Pravastatin Sodium           Current Medications (03/30/2023):  This is the current hospital active medication list Current Facility-Administered Medications  Medication Dose Route Frequency Provider Last Rate Last Admin   0.9 %  sodium chloride infusion  10 mL/hr Intravenous Once Montez Morita, PA-C       acetaminophen (TYLENOL) tablet 650 mg  650 mg Oral Q6H PRN Montez Morita, PA-C       apixaban Everlene Balls) tablet 5 mg  5 mg Oral BID Rolly Salter, MD   5 mg at 03/30/23 1027   bisacodyl (DULCOLAX) EC tablet 5 mg  5 mg Oral Daily PRN Montez Morita, PA-C       divalproex (DEPAKOTE SPRINKLE) capsule 125 mg  125 mg Oral Daily Rolly Salter, MD   125 mg at 03/30/23 1027   docusate sodium (COLACE) capsule 100 mg  100 mg Oral BID Montez Morita, PA-C   100 mg at 03/30/23 1027   donepezil (ARICEPT) tablet 5 mg  5 mg Oral QPM Montez Morita, PA-C   5 mg at 03/29/23 1726   escitalopram (LEXAPRO) tablet 20 mg  20 mg Oral Daily Montez Morita, PA-C   20 mg at 03/30/23 1027   feeding supplement (ENSURE ENLIVE / ENSURE PLUS) liquid 237 mL  237 mL Oral BID BM Montez Morita, PA-C   237 mL at 03/30/23 1342   gabapentin (NEURONTIN) capsule 300 mg  300 mg Oral TID Montez Morita, PA-C   300 mg at 03/30/23 1027   haloperidol lactate (HALDOL) injection 2-5 mg  2-5 mg Intravenous Q6H PRN Montez Morita, PA-C       melatonin tablet 5 mg  5 mg Oral QHS Montez Morita, PA-C   5 mg at 03/29/23 2141   methocarbamol (ROBAXIN) tablet 500 mg  500 mg Oral Q6H PRN Montez Morita, PA-C   500 mg at 03/29/23 2141   Or   methocarbamol (ROBAXIN) 500 mg in dextrose 5 % 50 mL IVPB  500 mg Intravenous Q6H PRN Montez Morita, PA-C       metoprolol tartrate (LOPRESSOR) tablet 12.5 mg  12.5 mg Oral BID Montez Morita, PA-C   12.5 mg at 03/30/23 1027   morphine (PF) 2 MG/ML injection 2 mg  2 mg Intravenous Q2H PRN Montez Morita, PA-C   2 mg at  03/30/23 0120   multivitamin with minerals tablet 1 tablet  1 tablet Oral Daily Montez Morita, PA-C   1 tablet at 03/30/23 1027   mupirocin ointment (BACTROBAN) 2 % 1 Application  1 Application Nasal BID Montez Morita, PA-C   1 Application at 03/30/23 1027   ondansetron (ZOFRAN) injection 4 mg  4 mg Intravenous Q6H PRN Montez Morita, PA-C       oxyCODONE (Oxy IR/ROXICODONE) immediate release tablet 5-10 mg  5-10 mg Oral Q4H PRN Montez Morita, PA-C   10 mg at 03/30/23 1027   pantoprazole (PROTONIX) EC tablet 40 mg  40 mg Oral Daily Montez Morita, PA-C   40 mg at 03/30/23 1027   polyethylene  glycol (MIRALAX / GLYCOLAX) packet 17 g  17 g Oral Daily PRN Montez Morita, PA-C       QUEtiapine (SEROQUEL) tablet 25 mg  25 mg Oral BID Montez Morita, PA-C   25 mg at 03/30/23 1027   rosuvastatin (CRESTOR) tablet 5 mg  5 mg Oral q morning Montez Morita, PA-C   5 mg at 03/30/23 1027     Discharge Medications: Please see discharge summary for a list of discharge medications.  Relevant Imaging Results:  Relevant Lab Results:   Additional Information SS#: [id number]  Lorri Frederick, LCSW

## 2023-03-30 NOTE — Progress Notes (Signed)
Nutrition Follow-up  DOCUMENTATION CODES:   Not applicable  INTERVENTION:  Liberalize diet to Regular; discussed with MD Continue Ensure Enlive po BID, each supplement provides 350 kcal and 20 grams of protein. Continue MVI with minerals daily  NUTRITION DIAGNOSIS:   Increased nutrient needs related to post-op healing as evidenced by estimated needs. - remains applicable  GOAL:   Patient will meet greater than or equal to 90% of their needs - progressing  MONITOR:   PO intake, Supplement acceptance, Labs, I & O's  REASON FOR ASSESSMENT:   Consult Assessment of nutrition requirement/status  ASSESSMENT:   89 y.o. male presented to the ED after a fall. PMH includes CAD, HTN, HLD, GERD, SDH, and Alzheimer's disease. Pt admitted with R acetabular/pubic rami fracture.  5/14 - s/p repair of R acetabular fx 5/15 - s/p BSE by ST- no s/s of dysphagia noted, recommend regular diet  Pt is very pleasant. Sitting up in bed with wife, daughter and minister at bedside.   He endorses having a good appetite. His family assisted with nutrition related history as pt is oriented x2 today.   He was on a regular diet PTA. They deny any noticeable difficulty with chewing or swallowing. He was intermittently consuming Ensure supplements PTA and enjoys these. No documented meal completions to review at this time.   Since admission to [facility] they report he was eating well and had been gaining weight. They report he has gone up 2 pant sizes but did not recall usual weight.    Reviewed weight history. Weights appear to have fluctuated between 84-87 kg but no significant declines or increased noted.   Medications: colace, melatonin, MVI, protonix  Labs: reviewed  NUTRITION - FOCUSED PHYSICAL EXAM:  Flowsheet Row Most Recent Value  Orbital Region Mild depletion  Upper Arm Region Mild depletion  Thoracic and Lumbar Region No depletion  Buccal Region No depletion  Temple Region No  depletion  Clavicle Bone Region Mild depletion  Clavicle and Acromion Bone Region No depletion  Scapular Bone Region No depletion  Dorsal Hand No depletion  Patellar Region No depletion  Anterior Thigh Region Mild depletion  Posterior Calf Region No depletion  Edema (RD Assessment) None  Hair Reviewed  Eyes Reviewed  Mouth Reviewed  Skin Reviewed  Nails Reviewed       Diet Order:   Diet Order             Diet regular Room service appropriate? Yes; Fluid consistency: Thin  Diet effective now                   EDUCATION NEEDS:   No education needs have been identified at this time  Skin:  Skin Assessment: Reviewed RN Assessment (incision R groin closed)  Last BM:  Unknown/PTA  Height:   Ht Readings from Last 1 Encounters:  03/29/23 6' (1.829 m)    Weight:   Wt Readings from Last 1 Encounters:  03/29/23 87.7 kg    Ideal Body Weight:  80.9 kg  BMI:  Body mass index is 26.22 kg/m.  Estimated Nutritional Needs:   Kcal:  1950-2150  Protein:  100-120 grams  Fluid:  >/= 2 L  Drusilla Kanner, RDN, LDN Clinical Nutrition

## 2023-03-30 NOTE — TOC Initial Note (Signed)
Transition of Care [facility]) - Initial/Assessment Note    Patient Details  Name: [name] MRN: [id number] Date of Birth: Dec 29, 1932  Transition of [facility]) CM/SW Contact:    Lorri Frederick, LCSW Phone Number: 03/30/2023, 2:29 PM  Clinical Narrative:    Pt oriented x2, from [facility].  CSW spoke with pt wife in room, confirmed that she does want pt to return to [facility].  Discussed PT recommendation for STR, she would be open to that or return to memory care unit with PT there.  Pt pleasantly confused.  CSW spoke with Donna/[facility], discussed pt NWB x6-8 weeks, she expects return would be to memory care and will confirm this.  Discussed potential DC tomorrow.                Expected Discharge Plan: Memory Care Barriers to Discharge: Continued Medical Work up   Patient Goals and CMS Choice     Choice offered to / list presented to : Spouse      Expected Discharge Plan and Services In-house Referral: Clinical Social Work   Post Acute Care Choice: Resumption of Svcs/PTA Provider ([facility]) Living arrangements for the past 2 months: Assisted Living Facility                                      Prior Living Arrangements/Services Living arrangements for the past 2 months: Assisted Living Facility Lives with:: Facility Resident Patient language and need for interpreter reviewed:: Yes        Need for Family Participation in Patient Care: Yes (Comment) Care giver support system in place?: Yes (comment) Current home services: Other (comment) (na) Criminal Activity/Legal Involvement Pertinent to Current Situation/Hospitalization: No - Comment as needed  Activities of Daily Living   ADL Screening (condition at time of admission) Patient's cognitive ability adequate to safely complete daily activities?: No Is the patient deaf or have difficulty hearing?: No Does the patient have difficulty seeing, even when wearing glasses/contacts?:  No Does the patient have difficulty concentrating, remembering, or making decisions?: Yes Patient able to express need for assistance with ADLs?: Yes  Permission Sought/Granted                  Emotional Assessment Appearance:: Appears stated age Attitude/Demeanor/Rapport: Engaged Affect (typically observed): Pleasant Orientation: : Oriented to Self, Oriented to Situation      Admission diagnosis:  Acetabular fracture (HCC) [S32.409A] Fall, initial encounter [W19.XXXA] Closed displaced fracture of right acetabulum, unspecified portion of acetabulum, initial encounter (HCC) [S32.401A] Closed fracture of multiple rami of left pubis, initial encounter (HCC) [S32.592A] Patient Active Problem List   Diagnosis Date Noted   Acetabular fracture (HCC) 03/28/2023   Closed stable fracture of multiple pubic rami (HCC) 03/28/2023   DNR (do not resuscitate) 03/28/2023   Subdural hematoma (HCC) 10/02/2022   Accidental fall 07/29/2022   DDD (degenerative disc disease), cervical 01/19/2022   Knee pain, left 01/19/2022   Dizzinesses 12/02/2021   Atherosclerosis of aorta (HCC) 03/02/2021   COVID-19 virus infection 11/15/2020   Weight loss 09/29/2020   Near syncope 09/04/2020   Chest pain    Dyspepsia    Encounter for medication management 11/14/2019   Angular stomatitis 07/09/2019   Cervical radiculopathy 06/05/2019   Pain in joint of left shoulder 05/31/2019   Concussion 04/17/2019   Hemoptysis 04/05/2019   Confusion 04/05/2019   Alzheimer's dementia (HCC) 12/21/2018  Nausea & vomiting 02/07/2018   Chills 02/07/2018   Rash 02/07/2018   Insomnia 09/22/2017   Cervical spondylolysis 06/28/2017   Hyperglycemia 06/22/2017   Well adult exam 11/18/2016   Hydronephrosis 11/18/2016   UPJ obstruction, 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   Allergic rhinitis 06/02/2016   Asthma with exacerbation 06/02/2016   Sinusitis, chronic 10/17/2015   OSA (obstructive sleep apnea) 06/19/2015   Cellulitis and  abscess of leg, except foot 05/26/2015   Paroxysmal atrial fibrillation (HCC) 02/21/2015   Upper respiratory infection 12/02/2014   Vivid dream 09/25/2014   Actinic keratoses 04/26/2014   CAD (coronary artery disease) 02/25/2014   STEMI (ST elevation myocardial infarction) (HCC) 01/08/2014   Rheumatoid bursitis of left elbow (HCC) 08/08/2013   Situational depression 06/18/2013   Neck pain 04/23/2012   Acute abdominal pain in left flank 02/14/2012   Hypertension 09/23/2011   Chest pain, midsternal 09/13/2011   Mixed hyperlipidemia 07/23/2010   Palpitations 07/07/2010   Rheumatoid arthritis (HCC) 06/04/2010   PARESTHESIA 06/04/2010   Pain in joint 11/27/2009   MYALGIA 08/21/2009   ALLERGIC RHINITIS 06/11/2008   OSTEOARTHRITIS 06/11/2008   FATIGUE 06/11/2008   LOW BACK PAIN 07/27/2007   COLONIC POLYPS, HX OF 07/27/2007   PCP:  [name], MD Pharmacy:  No Pharmacies Listed    Social Determinants of Health (SDOH) Social History: SDOH Screenings   Food Insecurity: No Food Insecurity (03/28/2023)  Housing: Low Risk  (03/28/2023)  Transportation Needs: No Transportation Needs (03/28/2023)  Utilities: Not At Risk (03/28/2023)  Alcohol Screen: Low Risk  (06/25/2022)  Depression (PHQ2-9): Low Risk  (11/02/2022)  Financial Resource Strain: Low Risk  (06/25/2022)  Physical Activity: Insufficiently Active (06/25/2022)  Social Connections: Moderately Integrated (06/25/2022)  Stress: No Stress Concern Present (06/25/2022)  Tobacco Use: Medium Risk (03/29/2023)   SDOH Interventions:     Readmission Risk Interventions     No data to display

## 2023-03-30 NOTE — Evaluation (Signed)
fsClinical/Bedside Swallow Evaluation Patient Details  Name: [name] MRN: [id number] Date of Birth: Jul 02, 1933  Today's Date: 03/30/2023 Time: SLP Start Time (ACUTE ONLY): 1340 SLP Stop Time (ACUTE ONLY): 1355 SLP Time Calculation (min) (ACUTE ONLY): 15 min  Past Medical History:  Past Medical History:  Diagnosis Date   CAD (coronary artery disease)    a. s/p inferior STEMI 01/08/2014; LHC 01/08/14: total RCA occlusion s/p 3.5x58mm Xience DES distal RCA and 3.5x28 mm DES mid RCA, 60-70% mid LAD stenosis, EF 55%.   Complication of anesthesia    'long to wake up after back surgery " 09/2003   Diverticulosis of colon    GERD (gastroesophageal reflux disease)    History of cellulitis    05-26-2015  LLE   History of colon polyps    1998- benign/  2008 adenomatous    History of kidney stones    2013   History of squamous cell carcinoma excision    2013;  2015;  06-12-2015 right leg/  02/ and 05/ 2017  left ear and left leg   Hydronephrosis, left    Hyperlipidemia    Hypertension    Migraine with aura    OSA on CPAP 06/19/2015   Moderate OSA with AHI 18/hr  per study 05-20-2015   Osteoarthritis    Paroxysmal atrial fibrillation (HCC) 4/16   chads2vasc score is at least 4   Premature atrial contractions    RA (rheumatoid arthritis) [facility])    rheumatologist-  dr Alben Deeds   Sinusitis, chronic 10/17/2015   Wears glasses    Wears hearing aid    bilateral   Past Surgical History:  Past Surgical History:  Procedure Laterality Date   BACK SURGERY     CARDIOVASCULAR STRESS TEST  11/21/2015   Low risk nuclear study w/ a small diaphragmatic attenuation artifact, no ischemia/  normal LV function and wall motion , ef 63%   CATARACT EXTRACTION W/ INTRAOCULAR LENS  IMPLANT, BILATERAL  2015   COLONOSCOPY  last one 06-09-2011   CORONARY ANGIOPLASTY     CYSTOSCOPY W/ RETROGRADES Left 07/12/2016   Procedure: CYSTOSCOPY WITH RETROGRADE PYELOGRAM LEFT URETERAL STENT;  Surgeon: Bjorn Pippin,  MD;  Location: [facility];  Service: Urology;  Laterality: Left;   CYSTOSCOPY WITH RETROGRADE PYELOGRAM, URETEROSCOPY AND STENT PLACEMENT Left 08/11/2016   Procedure: CYSTOSCOPY WITH RETROGRADE PYELOGRAM,  DIAGNOSTIC URETEROSCOPY , STENT EXCHANGE;  Surgeon: Sebastian Ache, MD;  Location: [facility];  Service: Urology;  Laterality: Left;   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   severe fibromatosis palm and fingers   LEFT HEART CATHETERIZATION WITH CORONARY ANGIOGRAM N/A 01/08/2014   Procedure: LEFT HEART CATHETERIZATION WITH CORONARY ANGIOGRAM;  Surgeon: Lennette Bihari, MD;  Location: [facility] CATH LAB;  Service: Cardiovascular;  Laterality:N/A;  total/ subtotal RCA/  mLAD 60-70% w/ mid systolic bridging/  preserved global LVF, ef 55%   MOHS SURGERY  x2  feb and may 2017   left ear /  left leg  (SCC)   PERCUTANEOUS CORONARY STENT INTERVENTION (PCI-S)  01/08/2014   Procedure: PERCUTANEOUS CORONARY STENT INTERVENTION (PCI-S);  Surgeon: Lennette Bihari, MD;  Location: [facility] CATH LAB;  Service: Cardiovascular;;  DES to mid and distal RCA   POSTERIOR LUMBAR FUSION  10/01/2003   and Laminectomy/ diskectomy  L4 -- S1   ROBOT ASSISTED PYELOPLASTY Left 09/15/2016   Procedure: XI ROBOTIC ASSISTED PYELOPLASTY;  Surgeon: Sebastian Ache, MD;  Location: [facility];  Service: Urology;  Laterality: Left;  TONSILLECTOMY     TRANSTHORACIC ECHOCARDIOGRAM  02/23/2016   mild LVH,  ef 50-55%,  grade 1 diastolic dysfunction/  mild to moderate AV calcification w/ no stenosis or regurg./  trivial MR and TR/  mild PR   HPI:  Patient is a 89 year old male who fell out of bed with immediate pain to right hip. Found to have right acetabular fracture s/p ORIF. History of dementia, CAD, HTN, HLD, OSA    Assessment / Plan / Recommendation  Clinical Impression  Patient is not currently presenting with clinical s/s of dysphagia as per this bedside swallow evaluation. He has no documented h/o dysphagia and per spouse who  was in the room, she has no current or past concerns regarding his swallowing. Patient had recently finished lunch meal and was drinking an Ensure shake when SLP arrived into room. Swallow initiation was timely and no overt s/s aspiration. No change in vitals. SLP spoke with RN after evaluation complete and she had no concerns regarding patient's swallowing. SLP not recommending further skilled intervention or assessment. SLP Visit Diagnosis: Dysphagia, unspecified (R13.10)    Aspiration Risk  No limitations    Diet Recommendation Regular;Thin liquid   Liquid Administration via: Cup;Straw Medication Administration: Whole meds with liquid Supervision: Patient able to self feed Compensations: Slow rate;Small sips/bites;Minimize environmental distractions Postural Changes: Seated upright at 90 degrees    Other  Recommendations Oral Care Recommendations: Oral care BID    Recommendations for follow up therapy are one component of a multi-disciplinary discharge planning process, led by the attending physician.  Recommendations may be updated based on patient status, additional functional criteria and insurance authorization.  Follow up Recommendations No SLP follow up      Assistance Recommended at Discharge    Functional Status Assessment Patient has had a recent decline in their functional status and demonstrates the ability to make significant improvements in function in a reasonable and predictable amount of time.  Frequency and Duration     N/A       Prognosis   N/A     Swallow Study   General Date of Onset: 03/28/23 HPI: Patient is a 89 year old male who fell out of bed with immediate pain to right hip. Found to have right acetabular fracture s/p ORIF. History of dementia, CAD, HTN, HLD, OSA Type of Study: Bedside Swallow Evaluation Previous Swallow Assessment: none found Diet Prior to this Study: Regular;Thin liquids (Level 0) Temperature Spikes Noted: No Respiratory Status:  Room air History of Recent Intubation: Yes Total duration of intubation (days):  (intubated 03/28/23 for surgery only) Behavior/Cognition: Alert;Cooperative;Pleasant mood Oral Cavity Assessment: Within Functional Limits Oral Care Completed by SLP: No Oral Cavity - Dentition: Adequate natural dentition Vision: Functional for self-feeding Self-Feeding Abilities: Able to feed self Patient Positioning: Upright in bed Baseline Vocal Quality: Normal Volitional Cough: Strong Volitional Swallow: Able to elicit    Oral/Motor/Sensory Function Overall Oral Motor/Sensory Function: Within functional limits   Ice Chips     Thin Liquid Thin Liquid: Within functional limits Presentation: Straw;Self Fed    Nectar Thick     Honey Thick     Puree Puree: Not tested   Solid     Solid: Not tested     Angela Nevin, MA, CCC-SLP Speech Therapy

## 2023-03-30 NOTE — Progress Notes (Signed)
Patient has mitts, CPAP contraindicated.

## 2023-03-30 NOTE — Evaluation (Signed)
Occupational Therapy Evaluation Patient Details Name: [name] MRN: [id number] DOB: [birth year] Today's Date: 03/30/2023   History of Present Illness Patient is a 89 year old male who fell out of bed with immediate pain to right hip. Found to have right acetabular fracture s/p ORIF. History of dementia, CAD, HTN, HLD, OSA   Clinical Impression   PTA, per chart pt from [facility] and ambulatory without AD. Pt questionable historian, but reporting he needs assist with medication management and meal prep in his home setting. Upon eval, pt with decreased cognition, memory, awareness, safety, strength, and balance. Pt performing UB ADL with up to min A due to decr comfort with sitting balance and LB Adl with total A +2. Pt performing STS transfers with max A +2 this session. Will continue to follow to optimize mobility. Patient will benefit from continued inpatient follow up therapy, <3 hours/day.      Recommendations for follow up therapy are one component of a multi-disciplinary discharge planning process, led by the attending physician.  Recommendations may be updated based on patient status, additional functional criteria and insurance authorization.   Assistance Recommended at Discharge Frequent or constant Supervision/Assistance  Patient can return home with the following Two people to help with walking and/or transfers;Two people to help with bathing/dressing/bathroom;Assist for transportation;Help with stairs or ramp for entrance;Direct supervision/assist for financial management;Direct supervision/assist for medications management;Assistance with cooking/housework    Functional Status Assessment  Patient has had a recent decline in their functional status and demonstrates the ability to make significant improvements in function in a reasonable and predictable amount of time.  Equipment Recommendations  Other (comment) (defer)    Recommendations for Other Services        Precautions / Restrictions Precautions Precautions: Fall Restrictions Weight Bearing Restrictions: Yes RLE Weight Bearing: Touchdown weight bearing      Mobility Bed Mobility Overal bed mobility: Needs Assistance Bed Mobility: Supine to Sit, Sit to Supine     Supine to sit: Max assist, +2 for physical assistance Sit to supine: Max assist, +2 for physical assistance   General bed mobility comments: verbal cues for task initiation and sequencing    Transfers Overall transfer level: Needs assistance Equipment used: Rolling walker (2 wheels) Transfers: Sit to/from Stand Sit to Stand: Max assist, +2 physical assistance, From elevated surface           General transfer comment: lifting assistance required, assist for keeping right knee extended to facilitate weight accptance on LLE with standing      Balance Overall balance assessment: Needs assistance Sitting-balance support: Feet supported Sitting balance-Leahy Scale: Fair     Standing balance support: Bilateral upper extremity supported Standing balance-Leahy Scale: Poor Standing balance comment: external support required to maintain standing balance with heavy reliance on RW for support. cues for off loading RLE in order to maintain weight bearing status                           ADL either performed or assessed with clinical judgement   ADL Overall ADL's : Needs assistance/impaired Eating/Feeding: Set up;Bed level   Grooming: Set up;Sitting   Upper Body Bathing: Minimal assistance;Sitting   Lower Body Bathing: Total assistance;+2 for physical assistance;+2 for safety/equipment   Upper Body Dressing : Minimal assistance;Sitting   Lower Body Dressing: Total assistance;+2 for physical assistance;+2 for safety/equipment   Toilet Transfer: Maximal assistance;+2 for safety/equipment;+2 for physical assistance;Rolling walker (2 wheels) Toilet Transfer  Details (indicate cue type and reason):  STS Toileting- Clothing Manipulation and Hygiene: Total assistance       Functional mobility during ADLs: Maximal assistance;+2 for safety/equipment;+2 for physical assistance General ADL Comments: STS this session     Vision Patient Visual Report: No change from baseline Vision Assessment?: No apparent visual deficits Additional Comments: Able to read therapist name tag     Perception Perception Perception Tested?: No   Praxis Praxis Praxis tested?: Not tested    Pertinent Vitals/Pain Pain Assessment Pain Assessment: Faces Faces Pain Scale: Hurts little more Pain Location: R hip with movement Pain Descriptors / Indicators: Discomfort, Grimacing Pain Intervention(s): Limited activity within patient's tolerance, Monitored during session     Hand Dominance     Extremity/Trunk Assessment Upper Extremity Assessment Upper Extremity Assessment: Generalized weakness   Lower Extremity Assessment Lower Extremity Assessment: Defer to PT evaluation RLE Deficits / Details: pain with movement of R hip. patient able to activate hip, knee, ankle movement in gravity eliminated position       Communication Communication Communication: HOH   Cognition Arousal/Alertness: Awake/alert Behavior During Therapy: WFL for tasks assessed/performed Overall Cognitive Status: History of cognitive impairments - at baseline                                 General Comments: Patient is oriented to self. He was not aware that he is in the hospital but can recall after being re-oriented. Disoriented to time and situation. He is able to follow single step commands consistently. Pleasant overall with history of dementia.     General Comments  VSS    Exercises     Shoulder Instructions      Home Living Family/patient expects to be discharged to:: Other (Comment) (Memory care at [facility])                                        Prior Functioning/Environment  Prior Level of Function : Independent/Modified Independent             Mobility Comments: independent with mobility. History of falls ADLs Comments: patient reports he has assistance with meals and medications (he reports his spouse helps him). Unsure of accuracy of report due to pt from memory care. Per chart was mobile without AD PTA        OT Problem List: Decreased strength;Decreased activity tolerance;Impaired balance (sitting and/or standing);Decreased safety awareness;Decreased knowledge of use of DME or AE;Decreased knowledge of precautions;Decreased cognition      OT Treatment/Interventions: Self-care/ADL training;Therapeutic exercise;DME and/or AE instruction;Therapeutic activities;Cognitive remediation/compensation;Patient/family education;Balance training    OT Goals(Current goals can be found in the care plan section) Acute Rehab OT Goals Patient Stated Goal: unable OT Goal Formulation: Patient unable to participate in goal setting Time For Goal Achievement: 04/13/23 Potential to Achieve Goals: Fair  OT Frequency: Min 2X/week    Co-evaluation PT/OT/SLP Co-Evaluation/Treatment: Yes ([place]) Reason for Co-Treatment: Complexity of the patient's impairments (multi-system involvement);To address functional/ADL transfers;Necessary to address cognition/behavior during functional activity PT goals addressed during session: Mobility/safety with mobility OT goals addressed during session: ADL's and self-care      AM-PAC OT "6 Clicks" Daily Activity     Outcome Measure Help from another person eating meals?: A Little Help from another person taking care of personal grooming?: A Little Help from another person toileting,  which includes using toliet, bedpan, or urinal?: Total Help from another person bathing (including washing, rinsing, drying)?: A Lot Help from another person to put on and taking off regular upper body clothing?: A Little Help from another person to put on  and taking off regular lower body clothing?: Total 6 Click Score: 13   End of Session Equipment Utilized During Treatment: Gait belt;Rolling walker (2 wheels) Nurse Communication: Mobility status  Activity Tolerance: Patient tolerated treatment well Patient left: in bed;with call bell/phone within reach;with bed alarm set  OT Visit Diagnosis: Unsteadiness on feet (R26.81);Muscle weakness (generalized) (M62.81)                Time: 1610-9604 OT Time Calculation (min): 25 min Charges:  OT General Charges $OT Visit: 1 Visit OT Evaluation $OT Eval Moderate Complexity: 1 Mod  Tyler Deis, OTR/L [facility] Acute Rehabilitation Office: [phone]   Myrla Halsted 03/30/2023, 11:48 AM

## 2023-03-30 NOTE — Hospital Course (Signed)
Patient 89 year old gentleman history of CAD, hypertension, hyperlipidemia, OSA on CPAP, A-fib on Eliquis, dementia, rheumatoid arthritis presented with a mechanical fall.  Found to have right acetabular and pubic rami fractures.  Orthopedic was consulted.  Underwent ORIF for right and stable fracture 5/14. Monitor H&H.

## 2023-03-30 NOTE — Progress Notes (Signed)
[facility] Progress Note Patient: [name] ZOX:[id number] DOB: 01/23/1933 DOA: 03/28/2023  DOS: the patient was seen and examined on 03/30/2023  Brief hospital course: Patient 89 year old gentleman history of CAD, hypertension, hyperlipidemia, OSA on CPAP, A-fib on Eliquis, dementia, rheumatoid arthritis presented with a mechanical fall.  Found to have right acetabular and pubic rami fractures.  Orthopedic was consulted.  Underwent ORIF for right and stable fracture 5/14. Monitor H&H. Assessment and Plan: Right acetabular fracture. Pubic rami fracture. Secondary to mechanical fall. Currently pain controlled. SP ORIF 5/14 with Dr. Carola Frost. TD WB right leg 6 to 8 weeks.  Essentially bed to chair only. Daily dressing changes needed starting 5/17. Sutures out 2 weeks from the date of surgery. Pain management per orthopedic. Patient received preop antibiotics. Therapy recommending SNF placement.  Acute postop blood loss anemia. SP 1 PRBC transfusion in OR on 5/14. Baseline hemoglobin around 11-12. Current hemoglobin 9.4. Will monitor.  Dementia without behavioral issues. Continue Depakote Aricept and Seroquel.  CAD SP PCI 2015. On Lopressor. Not on any antithrombotic therapy for now.  HLD. Continuing statin.  Paroxysmal A-fib. Rate controlled. On metoprolol. Eliquis resumed per orthopedics.  OSA. Will continue CPAP nightly.  Rheumatoid arthritis. Previously on Orencia. Currently stopped. Outpatient follow-up with Dr. Raquel James.  Subjective: Pain well-controlled.  No nausea no vomiting.  Denies any acute complaint.  Physical Exam: General: in Mild distress, No Rash Cardiovascular: S1 and S2 Present, No Murmur Respiratory: Good respiratory effort, Bilateral Air entry present. No Crackles, No wheezes Abdomen: Bowel Sound present, No tenderness Extremities: No edema Neuro: Alert and oriented to person only, no new focal deficit  Data Reviewed: I have Reviewed  nursing notes, Vitals, and Lab results. Since last encounter, pertinent lab results CBC and BMP   . I have ordered test including CBC and BMP  .   Disposition: Status is: Inpatient Remains inpatient appropriate because: Awaiting stability of hemoglobin  SCDs Start: 03/28/23 1119 apixaban (ELIQUIS) tablet 5 mg   Family Communication: Family at bedside Level of care: Med-Surg   Vitals:   03/30/23 0014 03/30/23 0420 03/30/23 0806 03/30/23 1432  BP: 116/64 (!) 148/75 (!) 156/73 112/68  Pulse: 60 62 64 66  Resp: 20 17 16 17   Temp: 98.9 F (37.2 C) 98.9 F (37.2 C) (!) 97.5 F (36.4 C) 97.9 F (36.6 C)  TempSrc: Oral Oral Oral   SpO2: 95% 95% 96% 94%  Weight:      Height:         Author: Lynden Oxford, MD 03/30/2023 7:19 PM  Please look on [email or web address] to find out who is on call.

## 2023-03-30 NOTE — Care Management Important Message (Signed)
Important Message  Patient Details  Name: [name] MRN: [id number] Date of Birth: December 21, 1932   Medicare Important Message Given:  Yes     [name] 03/30/2023, 11:43 AM

## 2023-03-31 ENCOUNTER — Encounter (HOSPITAL_COMMUNITY): Payer: Self-pay | Admitting: Orthopedic Surgery

## 2023-03-31 DIAGNOSIS — S32401A Unspecified fracture of right acetabulum, initial encounter for closed fracture: Secondary | ICD-10-CM | POA: Diagnosis not present

## 2023-03-31 LAB — CBC
HCT: 27.9 % — ABNORMAL LOW (ref 39.0–52.0)
Hemoglobin: 9.4 g/dL — ABNORMAL LOW (ref 13.0–17.0)
MCH: 31.5 pg (ref 26.0–34.0)
MCHC: 33.7 g/dL (ref 30.0–36.0)
MCV: 93.6 fL (ref 80.0–100.0)
Platelets: 221 10*3/uL (ref 150–400)
RBC: 2.98 MIL/uL — ABNORMAL LOW (ref 4.22–5.81)
RDW: 14.9 % (ref 11.5–15.5)
WBC: 7.5 10*3/uL (ref 4.0–10.5)
nRBC: 0 % (ref 0.0–0.2)

## 2023-03-31 LAB — BASIC METABOLIC PANEL
Anion gap: 10 (ref 5–15)
BUN: 20 mg/dL (ref 8–23)
CO2: 23 mmol/L (ref 22–32)
Calcium: 8.3 mg/dL — ABNORMAL LOW (ref 8.9–10.3)
Chloride: 102 mmol/L (ref 98–111)
Creatinine, Ser: 0.77 mg/dL (ref 0.61–1.24)
GFR, Estimated: >60 mL/min (ref >60)
Glucose, Bld: 112 mg/dL — ABNORMAL HIGH (ref 70–99)
Potassium: 3.5 mmol/L (ref 3.5–5.1)
Sodium: 135 mmol/L (ref 135–145)

## 2023-03-31 LAB — MAGNESIUM: Magnesium: 2.1 mg/dL (ref 1.7–2.4)

## 2023-03-31 MED ORDER — METHOCARBAMOL 500 MG PO TABS: 500.0000 mg | ORAL_TABLET | Freq: Four times a day (QID) | ORAL | 0 refills | Status: DC | PRN

## 2023-03-31 MED ORDER — QUETIAPINE FUMARATE 25 MG PO TABS: 25.0000 mg | ORAL_TABLET | Freq: Two times a day (BID) | ORAL | 0 refills | Status: DC

## 2023-03-31 MED ORDER — ADULT MULTIVITAMIN W/MINERALS CH: 1.0000 | ORAL_TABLET | Freq: Every day | ORAL | 0 refills | Status: AC

## 2023-03-31 MED ORDER — ACETAMINOPHEN 500 MG PO TABS: 1000.0000 mg | ORAL_TABLET | Freq: Three times a day (TID) | ORAL | 0 refills | Status: DC

## 2023-03-31 MED ORDER — DOCUSATE SODIUM 100 MG PO CAPS: 100.0000 mg | ORAL_CAPSULE | Freq: Two times a day (BID) | ORAL | 0 refills | Status: DC

## 2023-03-31 MED ORDER — GABAPENTIN 300 MG PO CAPS: 300.0000 mg | ORAL_CAPSULE | Freq: Three times a day (TID) | ORAL | 0 refills | Status: DC

## 2023-03-31 MED ORDER — OXYCODONE-ACETAMINOPHEN 5-325 MG PO TABS: 1.0000 | ORAL_TABLET | Freq: Four times a day (QID) | ORAL | 0 refills | Status: DC | PRN

## 2023-03-31 MED ORDER — ACETAMINOPHEN 500 MG PO TABS: 500.0000 mg | ORAL_TABLET | Freq: Three times a day (TID) | ORAL | 2 refills | Status: DC | PRN

## 2023-03-31 MED ORDER — ENSURE ENLIVE PO LIQD: 237.0000 mL | Freq: Three times a day (TID) | ORAL | 0 refills | Status: DC

## 2023-03-31 NOTE — Discharge Summary (Signed)
Physician Discharge Summary   Patient: [name] MRN: [id number] DOB: December 12, 1932  Admit date:     03/28/2023  Discharge date: 03/31/23  Discharge Physician: Lynden Oxford  PCP: [name], MD  Recommendations at discharge: Follow up with Orthopedics as recommended  Follow up with PCP in 1 week with CBC   Follow-up Information     [name], MD. Schedule an appointment as soon as possible for a visit in 1 week(s).   Specialty: Internal Medicine Contact information: [address] [place] [address] [phone]         Myrene Galas, MD. Schedule an appointment as soon as possible for a visit in 1 week(s).   Specialty: Orthopedic Surgery Why: For suture removal, For wound re-check Contact information: [address] [place] [address] [phone]                Discharge Diagnoses: Principal Problem:   Acetabular fracture (HCC) Active Problems:   Mixed hyperlipidemia   Rheumatoid arthritis (HCC)   Hypertension   CAD (coronary artery disease)   Paroxysmal atrial fibrillation (HCC)   OSA (obstructive sleep apnea)   Alzheimer's dementia (HCC)   Closed stable fracture of multiple pubic rami (HCC)   DNR (do not resuscitate)  Hospital Course: Patient 89 year old gentleman history of CAD, hypertension, hyperlipidemia, OSA on CPAP, A-fib on Eliquis, dementia, rheumatoid arthritis presented with a mechanical fall.  Found to have right acetabular and pubic rami fractures.  Orthopedic was consulted.  Underwent ORIF for right and stable fracture 5/14. Had some post op anemia remained stable after the first drop.  Assessment and Plan  Right acetabular fracture. Pubic rami fracture. Secondary to mechanical fall. Currently pain controlled. SP ORIF 5/14 with Dr. Carola Frost. TD WB right leg 6 to 8 weeks.  Essentially bed to chair only. Daily dressing changes needed starting 5/17. Sutures out 2 weeks from the date of surgery. Pain management per  orthopedic. Patient received preop antibiotics. Therapy recommending SNF placement.   Acute postop blood loss anemia. SP 1 PRBC transfusion in OR on 5/14. Baseline hemoglobin around 11-12. Current hemoglobin 9 stable x2   Dementia without behavioral issues. Continue Depakote Aricept and Seroquel.   CAD SP PCI 2015. On Lopressor. Not on any antiplatelet therapy for now.   HLD. Continuing statin.   Paroxysmal A-fib. Rate controlled. On metoprolol. Eliquis resumed per orthopedics.   OSA. Will continue CPAP nightly.   Rheumatoid arthritis. Previously on Orencia. Currently stopped. Outpatient follow-up with Dr. Raquel James.   Pain control - [employer] Controlled Substance Reporting System database was reviewed. and patient was instructed, not to drive, operate heavy machinery, perform activities at heights, swimming or participation in water activities or provide baby-sitting services while on Pain, Sleep and Anxiety Medications; until their outpatient Physician has advised to do so again. Also recommended to not to take more than prescribed Pain, Sleep and Anxiety Medications.  Consultants:  Orthopedics   Procedures performed:  OPEN REDUCTION INTERNAL FIXATION OF RIGHT ACETABULAR FRACTURE INVOLVING THE WEIGHT BEARING DOME/ TWO COLUMNS USING ANTERIOR STOPPA APPROACH   DISCHARGE MEDICATION: Allergies as of 03/31/2023       Reactions   Methotrexate Other (See Comments)   REACTION: tachycardia   Aricept [donepezil Hcl] Other (See Comments)   Nightmares. Pt takes this medication per [facility]    Crestor [rosuvastatin Calcium] Other (See Comments)   Myalgias with 20mg  dose   Lisinopril Other (See Comments)   cough   Namenda [memantine] Diarrhea,  Nausea Only   Atorvastatin Other (See Comments)   Muscle pain, leg cramps   Pravastatin Sodium Other (See Comments)   REACTION: cramps, fatigue        Medication List     STOP taking these medications    divalproex 125 MG  capsule Commonly known as: DEPAKOTE SPRINKLE   LORazepam 0.5 MG tablet Commonly known as: ATIVAN       TAKE these medications    acetaminophen 500 MG tablet Commonly known as: TYLENOL Take 1 tablet (500 mg total) by mouth every 8 (eight) hours as needed. What changed: You were already taking a medication with the same name, and this prescription was added. Make sure you understand how and when to take each.   acetaminophen 500 MG tablet Commonly known as: TYLENOL Take 2 tablets (1,000 mg total) by mouth in the morning, at noon, and at bedtime for 7 days. What changed: when to take this   apixaban 5 MG Tabs tablet Commonly known as: Eliquis Take 1 tablet (5 mg total) by mouth 2 (two) times daily.   docusate sodium 100 MG capsule Commonly known as: COLACE Take 1 capsule (100 mg total) by mouth 2 (two) times daily.   donepezil 5 MG tablet Commonly known as: ARICEPT TAKE ONE TABLET BY MOUTH EVERY MORNING What changed: when to take this   escitalopram 20 MG tablet Commonly known as: LEXAPRO Take 20 mg by mouth daily.   ezetimibe 10 MG tablet Commonly known as: ZETIA Take 5 mg by mouth daily.   feeding supplement Liqd Take 237 mLs by mouth 3 (three) times daily between meals.   gabapentin 300 MG capsule Commonly known as: NEURONTIN Take 1 capsule (300 mg total) by mouth 3 (three) times daily.   melatonin 5 MG Tabs Take 5 mg by mouth at bedtime.   methocarbamol 500 MG tablet Commonly known as: ROBAXIN Take 1 tablet (500 mg total) by mouth every 6 (six) hours as needed for muscle spasms.   metoprolol tartrate 25 MG tablet Commonly known as: LOPRESSOR Take 25 mg by mouth at bedtime.   multivitamin with minerals Tabs tablet Take 1 tablet by mouth daily. Start taking on: Apr 01, 2023   nitroGLYCERIN 0.4 MG SL tablet Commonly known as: NITROSTAT Place 0.4 mg under the tongue every 5 (five) minutes as needed for chest pain.   omeprazole 40 MG capsule Commonly  known as: PRILOSEC Take 40 mg by mouth in the morning. Take on empty stomach 30 minutes before breakfast.   oxyCODONE-acetaminophen 5-325 MG tablet Commonly known as: Percocet Take 1 tablet by mouth every 6 (six) hours as needed for severe pain or moderate pain.   QUEtiapine 25 MG tablet Commonly known as: SEROQUEL Take 1 tablet (25 mg total) by mouth 2 (two) times daily.   Repatha SureClick 140 MG/ML Soaj Generic drug: Evolocumab INJECT 1 PEN INTO SKIN EVERY 14 DAYS What changed:  how much to take how to take this when to take this additional instructions   rosuvastatin 5 MG tablet Commonly known as: CRESTOR Take 5 mg by mouth every morning.   triamcinolone cream 0.1 % Commonly known as: KENALOG Apply 1 Application topically 2 (two) times daily. Apply to arms, back, and legs for rash for 2 weeks starting 03/17/23 What changed: Another medication with the same name was removed. Continue taking this medication, and follow the directions you see here.       Disposition: SNF Diet recommendation: Regular diet  Discharge Exam: Vitals:  03/30/23 1432 03/30/23 2030 03/31/23 0400 03/31/23 0900  BP: 112/68 (!) 136/93 124/86 (!) 119/57  Pulse: 66 75 78 68  Resp: 17  16 18   Temp: 97.9 F (36.6 C) 98.4 F (36.9 C) 98 F (36.7 C) 98 F (36.7 C)  TempSrc:  Oral Oral Oral  SpO2: 94% 92%  98%  Weight:      Height:       General: Appear in mild distress; no visible Abnormal Neck Mass Or lumps, Conjunctiva normal Cardiovascular: S1 and S2 Present, no Murmur, Respiratory: good respiratory effort, Bilateral Air entry present and CTA, no Crackles, no wheezes Abdomen: Bowel Sound present, Non tender  Extremities: no Pedal edema Neurology: alert and oriented to self only, chronic dementia, no new focal deficiti Filed Weights   03/28/23 0713 03/29/23 0958  Weight: 87.7 kg 87.7 kg   Condition at discharge: stable  The results of significant diagnostics from this hospitalization  (including imaging, microbiology, ancillary and laboratory) are listed below for reference.   Imaging Studies: DG Pelvis Comp Min 3V  Result Date: 03/29/2023 CLINICAL DATA:  Fracture, ORIF EXAM: JUDET PELVIS - 3+ VIEW COMPARISON:  03/28/2023 FINDINGS: Plate and screw fixation device is noted in the right hemipelvis across the acetabular fractures. Anatomic alignment. No subluxation or dislocation. IMPRESSION: Internal fixation right acetabular fractures. No complicating feature. Electronically Signed   By: Charlett Nose M.D.   On: 03/29/2023 20:46   DG Pelvis Comp Min 3V  Result Date: 03/29/2023 CLINICAL DATA:  ORIF of the right acetabulum. Portable operative imaging. EXAM: OPERATIVE RIGHT HIP 6 VIEWS TECHNIQUE: Fluoroscopic spot image(s) were submitted for interpretation post-operatively. COMPARISON:  CT, 03/28/2023. FINDINGS: Portable operative imaging demonstrates placement of this a shin plates and screws along the right acetabulum reducing the comminuted acetabular fracture noted on the previous day's CT. Orthopedic hardware appears well seated. IMPRESSION: 1. Operative imaging provided for right acetabular fracture ORIF. Electronically Signed   By: Amie Portland M.D.   On: 03/29/2023 15:58   DG C-Arm 1-60 Min-No Report  Result Date: 03/29/2023 Fluoroscopy was utilized by the requesting physician.  No radiographic interpretation.   DG C-Arm 1-60 Min-No Report  Result Date: 03/29/2023 Fluoroscopy was utilized by the requesting physician.  No radiographic interpretation.   CT PELVIS WO CONTRAST  Result Date: 03/28/2023 CLINICAL DATA:  Hip fracture. EXAM: CT PELVIS WITHOUT CONTRAST TECHNIQUE: Multidetector CT imaging of the pelvis was performed following the standard protocol without intravenous contrast. RADIATION DOSE REDUCTION: This exam was performed according to the departmental dose-optimization program which includes automated exposure control, adjustment of the mA and/or kV according to  patient size and/or use of iterative reconstruction technique. COMPARISON:  X-ray earlier 03/28/2023.  CT scan 10/02/2022 FINDINGS: Urinary Tract: Enlarged prostate with mass effect along the base of the bladder. Bladder wall is slightly thickened and trabeculated. Bowel: Few sigmoid colon diverticula identified. Scattered colonic stool. The visualized bowel in the pelvis is nondilated. Normal appendix. Vascular/Lymphatic: Diffuse vascular calcifications identified along the iliac vessels and distal aorta. No discrete abnormal lymph node enlargement identified in the visualized pelvis. Reproductive: Enlarged prostate with mass effect along the base of the bladder. Other: Extraperitoneal hematoma identified along the right hemipelvis. There is also nonspecific stranding in the presacral space Musculoskeletal: Comminuted fracture involving the right acetabulum superior and medial displacement. Anterior and posterior columns of the acetabulum are involved with the fracture as well. Additional area of injury involves the inferior pubic ramus. This is in 2 areas, midportion and towards  the pubic symphysis. No additional areas of fracture or dislocation. Mild degenerative changes seen of the sacroiliac joints, left hip joint. Mild hypertrophic changes of the pubic symphysis. Degenerative changes of the visualized lumbar spine with surgical material along the disc spaces at L4-5 and L5-S1. IMPRESSION: Comminuted displaced fracture of the right acetabulum. This involves all portions of the acetabular including medial as well as the anterior and posterior columns. Separate fracture of the inferior pubic ramus in 2 areas with 1 extending towards the symphysis. Extraperitoneal hematoma along the right hemipelvis. Electronically Signed   By: Karen Kays M.D.   On: 03/28/2023 09:58   DG Chest 1 View  Result Date: 03/28/2023 CLINICAL DATA:  Right hip fracture EXAM: CHEST  1 VIEW COMPARISON:  Previous studies including the  examination of 12/26/2022 FINDINGS: Transient diameter of heart is slightly increased. Thoracic aorta is ectatic. Lung fields are clear of any infiltrates or pulmonary edema. There is no pleural effusion or pneumothorax. Deformities are noted in posterior left fourth, fifth and sixth ribs suggesting old healed fractures. There is previous surgical fusion in cervical spine. IMPRESSION: There are no signs of pulmonary edema or focal pulmonary consolidation. Electronically Signed   By: Ernie Avena M.D.   On: 03/28/2023 08:55   DG Hip Unilat  With Pelvis 2-3 Views Right  Result Date: 03/28/2023 CLINICAL DATA:  Trauma, fall.  Painful to bear weight. EXAM: DG HIP (WITH OR WITHOUT PELVIS) 2-3V RIGHT COMPARISON:  Radiographs dated October 02, 2022 FINDINGS: There is cortical irregularity of the acetabular floor and right pubic rami consistent with pelvic fracture. Mild bilateral hip osteoarthritis. Sacroiliac joints and pubic symphysis appear intact. IMPRESSION: Cortical irregularity of the acetabular floor and right pubic rami consistent with pelvic fracture. CT examination for further evaluation is suggested. Electronically Signed   By: Larose [name] D.O.   On: 03/28/2023 08:48   CT HEAD WO CONTRAST  Result Date: 03/28/2023 CLINICAL DATA:  Trauma EXAM: CT HEAD WITHOUT CONTRAST TECHNIQUE: Contiguous axial images were obtained from the base of the skull through the vertex without intravenous contrast. RADIATION DOSE REDUCTION: This exam was performed according to the departmental dose-optimization program which includes automated exposure control, adjustment of the mA and/or kV according to patient size and/or use of iterative reconstruction technique. COMPARISON:  10/02/2022 FINDINGS: Brain: No acute intracranial findings are seen. There are no signs of bleeding within the cranium. There is interval resolution of small left subdural hematoma. There is prominence of the third and both lateral ventricles.  Cortical sulci are prominent. There is decreased density in periventricular and subcortical white matter. Possible small old lacunar infarcts are seen in basal ganglia. There is low-density in the anterior aspect of pons which may be an artifact or old lacunar infarct. Vascular: Scattered arterial calcifications are seen. Skull: No fracture is seen in calvarium. Sinuses/Orbits: There is mucosal thickening in ethmoid, sphenoid and maxillary sinuses. Other: There is increased amount of CSF in the sella suggesting partial empty sella. IMPRESSION: No acute intracranial findings are seen. There are no signs of bleeding within the cranium. There is no focal mass effect. Atrophy. Small vessel disease. Possible small old lacunar infarct in pons and basal ganglia. Chronic sinusitis. Electronically Signed   By: Ernie Avena M.D.   On: 03/28/2023 08:20    Microbiology: Results for orders placed or performed during the hospital encounter of 03/28/23  Surgical PCR screen     Status: None   Collection Time: 03/28/23  4:29 PM  Specimen: Nasal Mucosa; Nasal Swab  Result Value Ref Range Status   MRSA, PCR NEGATIVE NEGATIVE Final   Staphylococcus aureus NEGATIVE NEGATIVE Final    Comment: (NOTE) The Xpert SA Assay (FDA approved for NASAL specimens in patients 46 years of age and older), is one component of a comprehensive surveillance program. It is not intended to diagnose infection nor to guide or monitor treatment. Performed at [facility], [address] [place], [place] [address]    Labs: CBC: Recent Labs  Lab 03/28/23 0815 03/29/23 6045 03/29/23 1340 03/30/23 0633 03/31/23 0219  WBC 7.5 6.9  --  7.5 7.5  HGB 11.3* 11.2* 9.2* 9.4* 9.4*  HCT 34.5* 34.6* 27.0* 28.1* 27.9*  MCV 96.4 95.1  --  94.9 93.6  PLT 270 264  --  197 221   Basic Metabolic Panel: Recent Labs  Lab 03/28/23 0815 03/29/23 0942 03/29/23 1340 03/30/23 0633 03/31/23 0219  NA 137 137 137 137 135  K 3.9 3.6  6.0* 4.0 3.5  CL 105 104 103 104 102  CO2 24 25  --  25 23  GLUCOSE 105* 112* 137* 148* 112*  BUN 16 19 29* 16 20  CREATININE 1.00 0.91 0.80 0.88 0.77  CALCIUM 8.5* 8.7*  --  8.2* 8.3*  MG  --  2.1  --  2.0 2.1   Liver Function Tests: Recent Labs  Lab 03/28/23 0815  AST 17  ALT 16  ALKPHOS 56  BILITOT 0.6  PROT 6.8  ALBUMIN 3.6   CBG: No results for input(s): "GLUCAP" in the last 168 hours.  Discharge time spent: greater than 30 minutes.  Signed: Lynden Oxford, MD [facility]

## 2023-03-31 NOTE — Discharge Instructions (Addendum)
Orthopaedic Trauma Service Discharge Instructions   General Discharge Instructions  Orthopaedic Injuries:  Right acetabulum fracture treated with open reduction and internal fixation using plate and screws  WEIGHT BEARING STATUS: Touchdown weightbearing Right leg, Weightbearing as tolerated left leg for bed to chair transfers   RANGE OF MOTION/ACTIVITY: motion as tolerated Right hip   Bone health: recommend 5000 IUs vitamin d3 daily   Review the following resource for additional information regarding bone health  [email or web address]  Wound Care: daily wound care as needed starting now.  Ok to shower and clean wound with soap and water only. No ointments, lotions or solutions to incision.    Discharge Wound Care Instructions  Do NOT apply any ointments, solutions or lotions to pin sites or surgical wounds.  These prevent needed drainage and even though solutions like hydrogen peroxide kill bacteria, they also damage cells lining the pin sites that help fight infection.  Applying lotions or ointments can keep the wounds moist and can cause them to breakdown and open up as well. This can increase the risk for infection. When in doubt call the office.  Surgical incisions should be dressed daily.  If any drainage is noted, use one layer of adaptic or Mepitel, then gauze and tape. Alternatively you can use a silicone foam dressing such as a mepilex   [email or web address].cz [email or web address]://[email or web address]-soto.com/?pd_rd_[email or web address]&[email or web address]  [email or web address].com/  These dressing supplies should be available at local medical supply stores ([employer], [facility] [employer], etc). They are not usually carried at places like [facility], [facility], [facility], etc  Once the incision is completely dry and without  drainage, it may be left open to air out.  Showering may begin 36-48 hours later.  Cleaning gently with soap and water.    DVT/PE prophylaxis: home eliquis   Diet: as you were eating previously.  Can use over the counter stool softeners and bowel preparations, such as Miralax, to help with bowel movements.  Narcotics can be constipating.  Be sure to drink plenty of fluids  PAIN MEDICATION USE AND EXPECTATIONS  You have likely been given narcotic medications to help control your pain.  After a traumatic event that results in an fracture (broken bone) with or without surgery, it is ok to use narcotic pain medications to help control one's pain.  We understand that everyone responds to pain differently and each individual patient will be evaluated on a regular basis for the continued need for narcotic medications. Ideally, narcotic medication use should last no more than 6-8 weeks (coinciding with fracture healing).   As a patient it is your responsibility as well to monitor narcotic medication use and report the amount and frequency you use these medications when you come to your office visit.   We would also advise that if you are using narcotic medications, you should take a dose prior to therapy to maximize you participation.  IF YOU ARE ON NARCOTIC MEDICATIONS IT IS NOT PERMISSIBLE TO OPERATE A MOTOR VEHICLE (MOTORCYCLE/CAR/TRUCK/MOPED) OR HEAVY MACHINERY DO NOT MIX NARCOTICS WITH OTHER CNS (CENTRAL NERVOUS SYSTEM) DEPRESSANTS SUCH AS ALCOHOL   POST-OPERATIVE OPIOID TAPER INSTRUCTIONS: It is important to wean off of your opioid medication as soon as possible. If you do not need pain medication after your surgery it is ok to stop day one. Opioids include: Codeine, Hydrocodone(Norco, Vicodin), Oxycodone(Percocet, oxycontin) and hydromorphone amongst others.  Long term and even short term use of opiods can cause: Increased pain response Dependence Constipation Depression Respiratory  depression And more.  Withdrawal symptoms can  include Flu like symptoms Nausea, vomiting And more Techniques to manage these symptoms Hydrate well Eat regular healthy meals Stay active Use relaxation techniques(deep breathing, meditating, yoga) Do Not substitute Alcohol to help with tapering If you have been on opioids for less than two weeks and do not have pain than it is ok to stop all together.  Plan to wean off of opioids This plan should start within one week post op of your fracture surgery  Maintain the same interval or time between taking each dose and first decrease the dose.  Cut the total daily intake of opioids by one tablet each day Next start to increase the time between doses. The last dose that should be eliminated is the evening dose.    STOP SMOKING OR USING NICOTINE PRODUCTS!!!!  As discussed nicotine severely impairs your body's ability to heal surgical and traumatic wounds but also impairs bone healing.  Wounds and bone heal by forming microscopic blood vessels (angiogenesis) and nicotine is a vasoconstrictor (essentially, shrinks blood vessels).  Therefore, if vasoconstriction occurs to these microscopic blood vessels they essentially disappear and are unable to deliver necessary nutrients to the healing tissue.  This is one modifiable factor that you can do to dramatically increase your chances of healing your injury.    (This means no smoking, no nicotine gum, patches, etc)  DO NOT USE NONSTEROIDAL ANTI-INFLAMMATORY DRUGS (NSAID'S)  Using products such as Advil (ibuprofen), Aleve (naproxen), Motrin (ibuprofen) for additional pain control during fracture healing can delay and/or prevent the healing response.  If you would like to take over the counter (OTC) medication, Tylenol (acetaminophen) is ok.  However, some narcotic medications that are given for pain control contain acetaminophen as well. Therefore, you should not exceed more than 4000 mg of tylenol in a day  if you do not have liver disease.  Also note that there are may OTC medicines, such as cold medicines and allergy medicines that my contain tylenol as well.  If you have any questions about medications and/or interactions please ask your doctor/PA or your pharmacist.      ICE AND ELEVATE INJURED/OPERATIVE EXTREMITY  Using ice and elevating the injured extremity above your heart can help with swelling and pain control.  Icing in a pulsatile fashion, such as 20 minutes on and 20 minutes off, can be followed.    Do not place ice directly on skin. Make sure there is a barrier between to skin and the ice pack.    Using frozen items such as frozen peas works well as the conform nicely to the are that needs to be iced.  USE AN ACE WRAP OR TED HOSE FOR SWELLING CONTROL  In addition to icing and elevation, Ace wraps or TED hose are used to help limit and resolve swelling.  It is recommended to use Ace wraps or TED hose until you are informed to stop.    When using Ace Wraps start the wrapping distally (farthest away from the body) and wrap proximally (closer to the body)   Example: If you had surgery on your leg or thing and you do not have a splint on, start the ace wrap at the toes and work your way up to the thigh        If you had surgery on your upper extremity and do not have a splint on, start the ace wrap at your fingers and work your way up to the upper arm  IF YOU ARE IN A SPLINT  OR CAST DO NOT REMOVE IT FOR ANY REASON   If your splint gets wet for any reason please contact the office immediately. You may shower in your splint or cast as long as you keep it dry.  This can be done by wrapping in a cast cover or garbage back (or similar)  Do Not stick any thing down your splint or cast such as pencils, money, or hangers to try and scratch yourself with.  If you feel itchy take benadryl as prescribed on the bottle for itching  IF YOU ARE IN A CAM BOOT (Aguiniga BOOT)  You may remove boot periodically.  Perform daily dressing changes as noted below.  Wash the liner of the boot regularly and wear a sock when wearing the boot. It is recommended that you sleep in the boot until told otherwise    Call office for the following: Temperature greater than 101F Persistent nausea and vomiting Severe uncontrolled pain Redness, tenderness, or signs of infection (pain, swelling, redness, odor or green/yellow discharge around the site) Difficulty breathing, headache or visual disturbances Hives Persistent dizziness or light-headedness Extreme fatigue Any other questions or concerns you may have after discharge  In an emergency, call 911 or go to an Emergency Department at a nearby hospital  HELPFUL INFORMATION  If you had a block, it will wear off between 8-24 hrs postop typically.  This is period when your pain may go from nearly zero to the pain you would have had postop without the block.  This is an abrupt transition but nothing dangerous is happening.  You may take an extra dose of narcotic when this happens.  You should wean off your narcotic medicines as soon as you are able.  Most patients will be off or using minimal narcotics before their first postop appointment.   We suggest you use the pain medication the first night prior to going to bed, in order to ease any pain when the anesthesia wears off. You should avoid taking pain medications on an empty stomach as it will make you nauseous.  Do not drink alcoholic beverages or take illicit drugs when taking pain medications.  In most states it is against the law to drive while you are in a splint or sling.  And certainly against the law to drive while taking narcotics.  You may return to work/school in the next couple of days when you feel up to it.   Pain medication may make you constipated.  Below are a few solutions to try in this order: Decrease the amount of pain medication if you aren't having pain. Drink lots of decaffeinated  fluids. Drink prune juice and/or each dried prunes  If the first 3 don't work start with additional solutions Take Colace - an over-the-counter stool softener Take Senokot - an over-the-counter laxative Take Miralax - a stronger over-the-counter laxative     CALL THE OFFICE WITH ANY QUESTIONS OR CONCERNS: [phone]   VISIT OUR WEBSITE FOR ADDITIONAL INFORMATION: [email or web address]

## 2023-03-31 NOTE — Progress Notes (Signed)
Patient removed right hand PIV, pulling at foley catheter; mitts replaced at this time.

## 2023-03-31 NOTE — Progress Notes (Signed)
Patient attempting to remove right hand PIV during change of shift report; reporting off RN secured IV dressing, and placed mitts on to maintain PIV access.

## 2023-03-31 NOTE — Progress Notes (Signed)
Orthopaedic Trauma Service Progress Note  Patient ID: [name] MRN: [id number] DOB/AGE: [birth year] 89 y.o.  Subjective:  Ortho issues stable Pain appears well controlled    ROS As above  Objective:   VITALS:   Vitals:   03/30/23 1432 03/30/23 2030 03/31/23 0400 03/31/23 0900  BP: 112/68 (!) 136/93 124/86 (!) 119/57  Pulse: 66 75 78 68  Resp: 17  16 18   Temp: 97.9 F (36.6 C) 98.4 F (36.9 C) 98 F (36.7 C) 98 F (36.7 C)  TempSrc:  Oral Oral Oral  SpO2: 94% 92%  98%  Weight:      Height:        Estimated body mass index is 26.22 kg/m as calculated from the following:   Height as of this encounter: 6' (1.829 m).   Weight as of this encounter: 87.7 kg.   Intake/Output      05/15 0701 05/16 0700 05/16 0701 05/17 0700   P.O.     I.V. (mL/kg)     Blood     Other 0    IV Piggyback     Total Intake(mL/kg) 0 (0)    Urine (mL/kg/hr) 650 (0.3)    Blood     Total Output 650    Net -650           LABS  Results for orders placed or performed during the hospital encounter of 03/28/23 (from the past 24 hour(s))  CBC     Status: Abnormal   Collection Time: 03/31/23  2:19 AM  Result Value Ref Range   WBC 7.5 4.0 - 10.5 K/uL   RBC 2.98 (L) 4.22 - 5.81 MIL/uL   Hemoglobin 9.4 (L) 13.0 - 17.0 g/dL   HCT 40.9 (L) 81.1 - 91.4 %   MCV 93.6 80.0 - 100.0 fL   MCH 31.5 26.0 - 34.0 pg   MCHC 33.7 30.0 - 36.0 g/dL   RDW 78.2 95.6 - 21.3 %   Platelets 221 150 - 400 K/uL   nRBC 0.0 0.0 - 0.2 %  Basic metabolic panel     Status: Abnormal   Collection Time: 03/31/23  2:19 AM  Result Value Ref Range   Sodium 135 135 - 145 mmol/L   Potassium 3.5 3.5 - 5.1 mmol/L   Chloride 102 98 - 111 mmol/L   CO2 23 22 - 32 mmol/L   Glucose, Bld 112 (H) 70 - 99 mg/dL   BUN 20 8 - 23 mg/dL   Creatinine, Ser 0.86 0.61 - 1.24 mg/dL   Calcium 8.3 (L) 8.9 - 10.3 mg/dL   GFR, Estimated >57 >84 mL/min   Anion  gap 10 5 - 15  Magnesium     Status: None   Collection Time: 03/31/23  2:19 AM  Result Value Ref Range   Magnesium 2.1 1.7 - 2.4 mg/dL     PHYSICAL EXAM:   Gen: resting comfortably in bed, pleasant, does not appear to be in any distress  Lungs: clear anterior fields  Cardiac: s1 and s2 Abd: + BS, NTND  Pelvis:  dressing over pfannenstiel incision is clean and dry    Dressings removed   Incision looks great    No signs of infection    New mepilex applied  Obturator sensation appears to be intact on the right              Motor and sensory functions are o/w intact on the right leg             Ext warm              + DP pulse             No DCT             Good perfusion distally   Assessment/Plan: 2 Days Post-Op   Principal Problem:   Acetabular fracture (HCC) Active Problems:   Mixed hyperlipidemia   Rheumatoid arthritis (HCC)   Hypertension   CAD (coronary artery disease)   Paroxysmal atrial fibrillation (HCC)   OSA (obstructive sleep apnea)   Alzheimer's dementia (HCC)   Closed stable fracture of multiple pubic rami (HCC)   DNR (do not resuscitate)   Anti-infectives (From admission, onward)    Start     Dose/Rate Route Frequency Ordered Stop   03/29/23 1730  ceFAZolin (ANCEF) IVPB 2g/100 mL premix        2 g 200 mL/hr over 30 Minutes Intravenous Every 8 hours 03/29/23 1640 03/30/23 0539   03/29/23 1351  vancomycin (VANCOCIN) powder  Status:  Discontinued          As needed 03/29/23 1351 03/29/23 1414   03/29/23 1050  ceFAZolin (ANCEF) 2-4 GM/100ML-% IVPB       Note to Pharmacy: [facility], [place]: cabinet override      03/29/23 1050 03/29/23 2259     .  POD/HD#: 2  89 y/o male s/p fall with fragility fracture of R acetabulum    -fall   - R anterior column posterior hemitransverse acetabulum fracture s/p ORIF via intrapelvic (stoppa) approach    Weightbearing TDWB R leg x 6-8 weeks Really bed to chair only given dementia                 ROM/Activity                         No ROM restrictions but bed to chair only                Wound care                         dressing changed today  Change as needed Ok to bathe and clean with soap and water only     - Pain management:             Multimodal              Minimize narcotics   - ABL anemia/Hemodynamics             stable   - Medical issues              Per primary    - DVT/PE prophylaxis:             home eliquis    - ID:              Periop abx completed    - Metabolic Bone Disease:             Fracture is a fragility fracture             Vitamin D levels pending  Optimize nutrition    - Activity:             As above   - Impediments to fracture healing:             Age              Bone quality              Ability to maintain compliance with post op restrictions    - Dispo:             stable for dc to snf from ortho standpoint  Follow up with ortho in 2 weeks    Mearl Latin, PA-C [phone] (C) 03/31/2023, 12:53 PM  [facility] [address] [place] [address] [phone] [phone] (F)    After 5pm and on the weekends please log on to Amion, go to orthopaedics and the look under the [facility] Call for the provider(s) on call. You can also call our office at [phone] and then follow the prompts to be connected to the call team.  Patient ID: [name], male   DOB: [birth year], 89 y.o.   MRN: [id number]

## 2023-03-31 NOTE — Progress Notes (Signed)
Patient resting comfortably in bed; more relaxed than previous assessment, no longer fidgeting. Mitts removed at this time.

## 2023-03-31 NOTE — TOC Progression Note (Signed)
Transition of Care [facility]) - Progression Note    Patient Details  Name: [name] MRN: [id number] Date of Birth: 1933/06/17  [facility] CM/SW Contact  Lorri Frederick, LCSW Phone Number: 03/31/2023, 10:31 AM  Clinical Narrative:   CSW spoke with Donna/[facility].  Pt has to be without mittens for 24 hours before he can return.  He will be going to rehab unit, need new FL2 reflecting this.  MD/RN informed on mittens.    Expected Discharge Plan: Memory Care Barriers to Discharge: Continued Medical Work up  Expected Discharge Plan and Services In-house Referral: Clinical Social Work   Post Acute Care Choice: Resumption of Svcs/PTA Provider ([facility]) Living arrangements for the past 2 months: Assisted Living Facility Expected Discharge Date: 03/31/23                                     Social Determinants of Health (SDOH) Interventions SDOH Screenings   Food Insecurity: No Food Insecurity (03/28/2023)  Housing: Low Risk  (03/28/2023)  Transportation Needs: No Transportation Needs (03/28/2023)  Utilities: Not At Risk (03/28/2023)  Alcohol Screen: Low Risk  (06/25/2022)  Depression (PHQ2-9): Low Risk  (11/02/2022)  Financial Resource Strain: Low Risk  (06/25/2022)  Physical Activity: Insufficiently Active (06/25/2022)  Social Connections: Moderately Integrated (06/25/2022)  Stress: No Stress Concern Present (06/25/2022)  Tobacco Use: Medium Risk (03/29/2023)    Readmission Risk Interventions     No data to display

## 2023-04-01 DIAGNOSIS — S32401K Unspecified fracture of right acetabulum, subsequent encounter for fracture with nonunion: Secondary | ICD-10-CM | POA: Diagnosis not present

## 2023-04-01 LAB — TYPE AND SCREEN

## 2023-04-01 LAB — BPAM RBC
ISSUE DATE / TIME: 202405141327
ISSUE DATE / TIME: 202405141327
Unit Type and Rh: 6200

## 2023-04-01 NOTE — Progress Notes (Signed)
Physical Therapy Treatment Patient Details Name: [name] MRN: [id number] DOB: [birth year] Today's Date: 04/01/2023   History of Present Illness Patient is a 89 year old male who fell out of bed with immediate pain to right hip. Found to have right acetabular fracture s/p ORIF. History of dementia, CAD, HTN, HLD, OSA    PT Comments    Pt received in supine and agreeable to session. Pt with intermittent command following and requiring cues for initiation. Pt avoiding RLE mobility due to pain and requiring assist for bed mobility. Pt requiring dense cues and assist to maintain RLE TDWB throughout session due to pt attempting to push with it for standing and scooting with pt asking "why" after each reminder. Pt able to perform seated exercises with cues. Pt able to stand briefly with max A +2 and therapist's foot under RLE to maintain TDWB. Pt continues to benefit from PT services to progress toward functional mobility goals.     Recommendations for follow up therapy are one component of a multi-disciplinary discharge planning process, led by the attending physician.  Recommendations may be updated based on patient status, additional functional criteria and insurance authorization.     Assistance Recommended at Discharge Frequent or constant Supervision/Assistance  Patient can return home with the following Two people to help with walking and/or transfers;A lot of help with bathing/dressing/bathroom;Assist for transportation;Help with stairs or ramp for entrance;Assistance with cooking/housework;Direct supervision/assist for medications management;Direct supervision/assist for financial management   Equipment Recommendations  None recommended by PT    Recommendations for Other Services       Precautions / Restrictions Precautions Precautions: Fall Restrictions Weight Bearing Restrictions: Yes RLE Weight Bearing: Touchdown weight bearing     Mobility  Bed Mobility Overal bed  mobility: Needs Assistance Bed Mobility: Supine to Sit, Sit to Supine     Supine to sit: Max assist, +2 for physical assistance Sit to supine: Max assist, +2 for physical assistance   General bed mobility comments: Cues for initiation, but pt avoiding movement with RLE due to pain. Pt able to pull on bedrail to assist with trunk elevation    Transfers Overall transfer level: Needs assistance Equipment used: Rolling walker (2 wheels) Transfers: Sit to/from Stand Sit to Stand: Max assist, +2 physical assistance, From elevated surface           General transfer comment: from elevated EOB with max A +2 for power up and upright posture. therapist's foot placed under RLE to maintain RLE TDWB.    Ambulation/Gait               General Gait Details: unable to attempt due to WB precautions       Balance Overall balance assessment: Needs assistance Sitting-balance support: Feet supported Sitting balance-Leahy Scale: Fair Sitting balance - Comments: sitting EOB   Standing balance support: Bilateral upper extremity supported, Reliant on assistive device for balance Standing balance-Leahy Scale: Poor Standing balance comment: with RW and assist to remain standing                            Cognition Arousal/Alertness: Awake/alert Behavior During Therapy: WFL for tasks assessed/performed Overall Cognitive Status: History of cognitive impairments - at baseline                                 General Comments: Pt requiring multiple cues and instruction for  RLE TDWB. Pt repeating "why?" when told he cannot WB through RLE.        Exercises General Exercises - Lower Extremity Long Arc Quad: AROM, Seated, Both, 10 reps Hip Flexion/Marching: AROM, Seated, Both, 5 reps    General Comments        Pertinent Vitals/Pain Pain Assessment Pain Assessment: Faces Faces Pain Scale: Hurts even more Pain Location: R hip with movement Pain Descriptors /  Indicators: Discomfort, Grimacing Pain Intervention(s): Monitored during session, Limited activity within patient's tolerance, Repositioned     PT Goals (current goals can now be found in the care plan section) Acute Rehab PT Goals Patient Stated Goal: to have less pain PT Goal Formulation: With patient Time For Goal Achievement: 04/13/23 Potential to Achieve Goals: Good Progress towards PT goals: Progressing toward goals    Frequency    Min 3X/week      PT Plan Current plan remains appropriate       AM-PAC PT "6 Clicks" Mobility   Outcome Measure  Help needed turning from your back to your side while in a flat bed without using bedrails?: A Lot Help needed moving from lying on your back to sitting on the side of a flat bed without using bedrails?: Total Help needed moving to and from a bed to a chair (including a wheelchair)?: Total Help needed standing up from a chair using your arms (e.g., wheelchair or bedside chair)?: Total Help needed to walk in hospital room?: Total Help needed climbing 3-5 steps with a railing? : Total 6 Click Score: 7    End of Session Equipment Utilized During Treatment: Gait belt Activity Tolerance: Patient tolerated treatment well;Patient limited by pain Patient left: in bed;with call bell/phone within reach;with bed alarm set Nurse Communication: Mobility status PT Visit Diagnosis: Other abnormalities of gait and mobility (R26.89);Difficulty in walking, not elsewhere classified (R26.2)     Time: 4098-1191 PT Time Calculation (min) (ACUTE ONLY): 31 min  Charges:  $Therapeutic Exercise: 8-22 mins $Therapeutic Activity: 8-22 mins                     Johny Shock, PTA Acute Rehabilitation Services Secure Chat Preferred  [phone]    Johny Shock 04/01/2023, 10:25 AM

## 2023-04-01 NOTE — Progress Notes (Signed)
Patient trialed without mits, ad started pulling at IV. Mitts replaced, CPAP is contraindicated at this time.

## 2023-04-01 NOTE — TOC Transition Note (Signed)
Transition of Care [facility]) - CM/SW Discharge Note   Patient Details  Name: [name] MRN: [id number] Date of Birth: [birth year]  Transition of Care [facility]) CM/SW Contact:  Lorri Frederick, LCSW Phone Number: 04/01/2023, 11:36 AM   Clinical Narrative:   Pt discharging to [facility], room 157.  RN call report to [phone].     Final next level of care: Skilled Nursing Facility Barriers to Discharge: Barriers Resolved   Patient Goals and CMS Choice   Choice offered to / list presented to : Spouse  Discharge Placement                Patient chooses bed at:  [facility]) Patient to be transferred to facility by: [facility] Name of family member notified: wife [name] Patient and family notified of of transfer: 04/01/23  Discharge Plan and Services Additional resources added to the After Visit Summary for   In-house Referral: Clinical Social Work   Post Acute Care Choice: Resumption of Svcs/PTA Provider ([facility])                               Social Determinants of Health (SDOH) Interventions SDOH Screenings   Food Insecurity: No Food Insecurity (03/28/2023)  Housing: Low Risk  (03/28/2023)  Transportation Needs: No Transportation Needs (03/28/2023)  Utilities: Not At Risk (03/28/2023)  Alcohol Screen: Low Risk  (06/25/2022)  Depression (PHQ2-9): Low Risk  (11/02/2022)  Financial Resource Strain: Low Risk  (06/25/2022)  Physical Activity: Insufficiently Active (06/25/2022)  Social Connections: Moderately Integrated (06/25/2022)  Stress: No Stress Concern Present (06/25/2022)  Tobacco Use: Medium Risk (03/31/2023)     Readmission Risk Interventions     No data to display

## 2023-04-01 NOTE — Progress Notes (Addendum)
[facility] PROGRESS NOTE  Patient: [name] ZOX:[id number]   PCP: [name], MD DOB: [birth year]   DOA: 03/28/2023   DOS: 04/01/2023    There was a note by RT last night that the pt was placed on mitts. I confirmed with the day RN Azucena Cecil. She did not get any report about pt being on mitts last night and when she started her service the pt was not having any mitts. When I entered the room the pt was not having any mitts. Pt has not IV to pull on on 5/17. Last PIV Removal Date/Time: 03/31/23 1049   Author: Lynden Oxford, MD [facility] 04/01/2023 9:57 AM   If 7PM-7AM, please contact night-coverage at [email or web address]

## 2023-04-01 NOTE — [place] FL2 (Signed)
[place] MEDICAID FL2 LEVEL OF CARE FORM     IDENTIFICATION  Patient Name: [name] Birthdate: 25-May-1933 Sex: male Admission Date (Current Location): 03/28/2023  [facility] [place] Number:  Producer, television/film/video and Address:  [facility], [address], [place], [place] [address]      Provider Number: [id number]  Attending Physician Name and Address:  Rolly Salter, MD  Relative Name and Phone Number:  Trejan, Scherr [phone]    Current Level of Care: Hospital Recommended Level of Care: Skilled Nursing Facility Prior Approval Number:    Date Approved/Denied:   PASRR Number:    Discharge Plan: SNF    Current Diagnoses: Patient Active Problem List   Diagnosis Date Noted   Acetabular fracture (HCC) 03/28/2023   Closed stable fracture of multiple pubic rami (HCC) 03/28/2023   DNR (do not resuscitate) 03/28/2023   Subdural hematoma (HCC) 10/02/2022   Accidental fall 07/29/2022   DDD (degenerative disc disease), cervical 01/19/2022   Knee pain, left 01/19/2022   Dizzinesses 12/02/2021   Atherosclerosis of aorta (HCC) 03/02/2021   COVID-19 virus infection 11/15/2020   Weight loss 09/29/2020   Near syncope 09/04/2020   Chest pain    Dyspepsia    Encounter for medication management 11/14/2019   Angular stomatitis 07/09/2019   Cervical radiculopathy 06/05/2019   Pain in joint of left shoulder 05/31/2019   Concussion 04/17/2019   Hemoptysis 04/05/2019   Confusion 04/05/2019   Alzheimer's dementia (HCC) 12/21/2018   Nausea & vomiting 02/07/2018   Chills 02/07/2018   Rash 02/07/2018   Insomnia 09/22/2017   Cervical spondylolysis 06/28/2017   Hyperglycemia 06/22/2017   Well adult exam 11/18/2016   Hydronephrosis 11/18/2016   UPJ obstruction, 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   Allergic rhinitis 06/02/2016   Asthma with exacerbation 06/02/2016   Sinusitis, chronic 10/17/2015   OSA (obstructive sleep apnea) 06/19/2015   Cellulitis and  abscess of leg, except foot 05/26/2015   Paroxysmal atrial fibrillation (HCC) 02/21/2015   Upper respiratory infection 12/02/2014   Vivid dream 09/25/2014   Actinic keratoses 04/26/2014   CAD (coronary artery disease) 02/25/2014   STEMI (ST elevation myocardial infarction) (HCC) 01/08/2014   Rheumatoid bursitis of left elbow (HCC) 08/08/2013   Situational depression 06/18/2013   Neck pain 04/23/2012   Acute abdominal pain in left flank 02/14/2012   Hypertension 09/23/2011   Chest pain, midsternal 09/13/2011   Mixed hyperlipidemia 07/23/2010   Palpitations 07/07/2010   Rheumatoid arthritis (HCC) 06/04/2010   PARESTHESIA 06/04/2010   Pain in joint 11/27/2009   MYALGIA 08/21/2009   ALLERGIC RHINITIS 06/11/2008   OSTEOARTHRITIS 06/11/2008   FATIGUE 06/11/2008   LOW BACK PAIN 07/27/2007   COLONIC POLYPS, HX OF 07/27/2007    Orientation RESPIRATION BLADDER Height & Weight     Self, Situation  O2 Incontinent, Indwelling catheter Weight: 193 lb 5.5 oz (87.7 kg) Height:  6' (182.9 cm)  BEHAVIORAL SYMPTOMS/MOOD NEUROLOGICAL BOWEL NUTRITION STATUS      Continent Diet (regular diet)  AMBULATORY STATUS COMMUNICATION OF NEEDS Skin   Total Care Verbally Surgical wounds                       Personal Care Assistance Level of Assistance  Bathing, Feeding, Dressing, Total care Bathing Assistance: Maximum assistance Feeding assistance: Limited assistance Dressing Assistance: Maximum assistance Total Care Assistance: Maximum assistance   Functional Limitations Info  Sight, Hearing, Speech Sight Info: Adequate Hearing Info: Impaired Speech Info: Adequate  SPECIAL CARE FACTORS FREQUENCY  PT (By licensed PT), OT (By licensed OT)     PT Frequency: evaluate and treat OT Frequency: evaluate and treat            Contractures Contractures Info: Not present    Additional Factors Info  Code Status, Allergies Code Status Info: DNR Allergies Info: Methotrexate, Aricept  (Donepezil Hcl), Crestor (Rosuvastatin Calcium), Lisinopril, Namenda (Memantine), Atorvastatin, Pravastatin Sodium           Current Medications (04/01/2023):  This is the current hospital active medication list Current Facility-Administered Medications  Medication Dose Route Frequency Provider Last Rate Last Admin   acetaminophen (TYLENOL) tablet 650 mg  650 mg Oral Q6H PRN Montez Morita, PA-C       apixaban Everlene Balls) tablet 5 mg  5 mg Oral BID Rolly Salter, MD   5 mg at 04/01/23 0850   bisacodyl (DULCOLAX) EC tablet 5 mg  5 mg Oral Daily PRN Montez Morita, PA-C       Chlorhexidine Gluconate Cloth 2 % PADS 6 each  6 each Topical Daily Rolly Salter, MD   6 each at 04/01/23 0850   docusate sodium (COLACE) capsule 100 mg  100 mg Oral BID Montez Morita, PA-C   100 mg at 04/01/23 0850   donepezil (ARICEPT) tablet 5 mg  5 mg Oral QPM Montez Morita, PA-C   5 mg at 03/31/23 1651   escitalopram (LEXAPRO) tablet 20 mg  20 mg Oral Daily Montez Morita, PA-C   20 mg at 04/01/23 0849   feeding supplement (ENSURE ENLIVE / ENSURE PLUS) liquid 237 mL  237 mL Oral BID BM Montez Morita, PA-C   237 mL at 04/01/23 0851   gabapentin (NEURONTIN) capsule 300 mg  300 mg Oral TID Montez Morita, PA-C   300 mg at 04/01/23 0850   haloperidol lactate (HALDOL) injection 2-5 mg  2-5 mg Intravenous Q6H PRN Montez Morita, PA-C       melatonin tablet 5 mg  5 mg Oral QHS Montez Morita, PA-C   5 mg at 03/31/23 2221   methocarbamol (ROBAXIN) tablet 500 mg  500 mg Oral Q6H PRN Montez Morita, PA-C   500 mg at 03/31/23 2029   Or   methocarbamol (ROBAXIN) 500 mg in dextrose 5 % 50 mL IVPB  500 mg Intravenous Q6H PRN Montez Morita, PA-C       metoprolol tartrate (LOPRESSOR) tablet 12.5 mg  12.5 mg Oral BID Montez Morita, PA-C   12.5 mg at 04/01/23 0850   morphine (PF) 2 MG/ML injection 2 mg  2 mg Intravenous Q2H PRN Montez Morita, PA-C   2 mg at 03/31/23 1204   multivitamin with minerals tablet 1 tablet  1 tablet Oral Daily Montez Morita, PA-C   1 tablet  at 04/01/23 0850   mupirocin ointment (BACTROBAN) 2 % 1 Application  1 Application Nasal BID Montez Morita, PA-C   1 Application at 04/01/23 0850   ondansetron (ZOFRAN) injection 4 mg  4 mg Intravenous Q6H PRN Montez Morita, PA-C       oxyCODONE (Oxy IR/ROXICODONE) immediate release tablet 5-10 mg  5-10 mg Oral Q4H PRN Montez Morita, PA-C   10 mg at 04/01/23 0651   pantoprazole (PROTONIX) EC tablet 40 mg  40 mg Oral Daily Montez Morita, PA-C   40 mg at 04/01/23 0850   polyethylene glycol (MIRALAX / GLYCOLAX) packet 17 g  17 g Oral Daily PRN Montez Morita, PA-C   17 g at 03/31/23 2029  QUEtiapine (SEROQUEL) tablet 25 mg  25 mg Oral BID Montez Morita, PA-C   25 mg at 04/01/23 0850   rosuvastatin (CRESTOR) tablet 5 mg  5 mg Oral q morning Montez Morita, PA-C   5 mg at 04/01/23 1610     Discharge Medications: Please see discharge summary for a list of discharge medications.  Relevant Imaging Results:  Relevant Lab Results:   Additional Information SS#: [id number]  Lorri Frederick, LCSW

## 2023-04-01 NOTE — Progress Notes (Signed)
Patient discharged, report called and given to Tiffany, LPN at [facility]. No additional questions and or concerns at this time. Patient IV removed per protocol. Patient awaiting [facility] for transport and discharge packet and paperwork will be sent to go to facility.

## 2023-04-01 NOTE — Discharge Summary (Addendum)
Physician Discharge Summary   Patient: [name] MRN: [id number] DOB: 03-19-1933  Admit date:     03/28/2023  Discharge date: 04/01/23  Discharge Physician: Lynden Oxford  PCP: [name], MD  Recommendations at discharge: Follow up with Orthopedics as recommended  Follow up with PCP in 1 week with CBC   Follow-up Information     [name], MD. Schedule an appointment as soon as possible for a visit in 1 week(s).   Specialty: Internal Medicine Contact information: [address] [place] [address] [phone]         Myrene Galas, MD. Schedule an appointment as soon as possible for a visit in 1 week(s).   Specialty: Orthopedic Surgery Why: For suture removal, For wound re-check Contact information: [address] [place] [address] [phone]                Discharge Diagnoses: Principal Problem:   Acetabular fracture (HCC) Active Problems:   Mixed hyperlipidemia   Rheumatoid arthritis (HCC)   Hypertension   CAD (coronary artery disease)   Paroxysmal atrial fibrillation (HCC)   OSA (obstructive sleep apnea)   Alzheimer's dementia (HCC)   Closed stable fracture of multiple pubic rami (HCC)   DNR (do not resuscitate)  Hospital Course: Patient 89 year old gentleman history of CAD, hypertension, hyperlipidemia, OSA on CPAP, A-fib on Eliquis, dementia, rheumatoid arthritis presented with a mechanical fall.  Found to have right acetabular and pubic rami fractures.  Orthopedic was consulted.  Underwent ORIF for right and stable fracture 5/14. Had some post op anemia remained stable after the first drop.  Assessment and Plan  Right acetabular fracture. Pubic rami fracture. Secondary to unwitnessed fall. Currently pain controlled. SP ORIF 5/14 with Dr. Carola Frost. TD WB right leg 6 to 8 weeks.  Essentially bed to chair only. Daily dressing changes needed starting 5/17. Sutures out 2 weeks from the date of surgery. Pain management per  orthopedic. Patient received preop antibiotics. Therapy recommending SNF placement.   Acute postop blood loss anemia. SP 1 PRBC transfusion in OR on 5/14. Baseline hemoglobin around 11-12. Current hemoglobin 9 stable x2   Dementia without behavioral issues. Continue Depakote Aricept and Seroquel. Pt was on ativan and depakote for 15 days per [facility] to be expiring on 5/15/ at his facility, during his stay pt has not needed that medicine so not continuing it forward.  I would prefer not to use ativan for now. Pt has not shown any behavioral issues and not needed benzo in the hospital. Given the high risk of meds and fall that happened after the recent start of this meds I would hold off.    CAD SP PCI 2015. On Lopressor. Not on any antiplatelet therapy for now.   HLD. Continuing statin.   Paroxysmal A-fib. Rate controlled. On metoprolol. Eliquis resumed per orthopedics.   OSA. Will continue CPAP nightly.   Rheumatoid arthritis. Previously on Orencia. Currently stopped. Outpatient follow-up with Dr. Raquel James.   Pain control - [employer] Controlled Substance Reporting System database was reviewed. and patient was instructed, not to drive, operate heavy machinery, perform activities at heights, swimming or participation in water activities or provide baby-sitting services while on Pain, Sleep and Anxiety Medications; until their outpatient Physician has advised to do so again. Also recommended to not to take more than prescribed Pain, Sleep and Anxiety Medications.  Consultants:  Orthopedics   Procedures performed:  OPEN REDUCTION INTERNAL FIXATION OF RIGHT ACETABULAR FRACTURE INVOLVING THE WEIGHT  BEARING DOME/ TWO COLUMNS USING ANTERIOR STOPPA APPROACH   DISCHARGE MEDICATION: Allergies as of 04/01/2023       Reactions   Methotrexate Other (See Comments)   REACTION: tachycardia   Aricept [donepezil Hcl] Other (See Comments)   Nightmares. Pt takes this medication per [facility]     Crestor [rosuvastatin Calcium] Other (See Comments)   Myalgias with 20mg  dose   Lisinopril Other (See Comments)   cough   Namenda [memantine] Diarrhea, Nausea Only   Atorvastatin Other (See Comments)   Muscle pain, leg cramps   Pravastatin Sodium Other (See Comments)   REACTION: cramps, fatigue        Medication List     STOP taking these medications    divalproex 125 MG capsule Commonly known as: DEPAKOTE SPRINKLE   LORazepam 0.5 MG tablet Commonly known as: ATIVAN   triamcinolone cream 0.1 % Commonly known as: KENALOG       TAKE these medications    acetaminophen 500 MG tablet Commonly known as: TYLENOL Take 1 tablet (500 mg total) by mouth every 8 (eight) hours as needed. What changed: You were already taking a medication with the same name, and this prescription was added. Make sure you understand how and when to take each.   acetaminophen 500 MG tablet Commonly known as: TYLENOL Take 2 tablets (1,000 mg total) by mouth in the morning, at noon, and at bedtime for 7 days. What changed: when to take this   apixaban 5 MG Tabs tablet Commonly known as: Eliquis Take 1 tablet (5 mg total) by mouth 2 (two) times daily.   docusate sodium 100 MG capsule Commonly known as: COLACE Take 1 capsule (100 mg total) by mouth 2 (two) times daily.   donepezil 5 MG tablet Commonly known as: ARICEPT TAKE ONE TABLET BY MOUTH EVERY MORNING What changed: when to take this   escitalopram 20 MG tablet Commonly known as: LEXAPRO Take 20 mg by mouth daily.   ezetimibe 10 MG tablet Commonly known as: ZETIA Take 5 mg by mouth daily.   feeding supplement Liqd Take 237 mLs by mouth 3 (three) times daily between meals.   gabapentin 300 MG capsule Commonly known as: NEURONTIN Take 1 capsule (300 mg total) by mouth 3 (three) times daily.   melatonin 5 MG Tabs Take 5 mg by mouth at bedtime.   methocarbamol 500 MG tablet Commonly known as: ROBAXIN Take 1 tablet (500 mg  total) by mouth every 6 (six) hours as needed for muscle spasms.   metoprolol tartrate 25 MG tablet Commonly known as: LOPRESSOR Take 25 mg by mouth at bedtime.   multivitamin with minerals Tabs tablet Take 1 tablet by mouth daily.   nitroGLYCERIN 0.4 MG SL tablet Commonly known as: NITROSTAT Place 0.4 mg under the tongue every 5 (five) minutes as needed for chest pain.   omeprazole 40 MG capsule Commonly known as: PRILOSEC Take 40 mg by mouth in the morning. Take on empty stomach 30 minutes before breakfast.   oxyCODONE-acetaminophen 5-325 MG tablet Commonly known as: Percocet Take 1 tablet by mouth every 6 (six) hours as needed for severe pain or moderate pain.   QUEtiapine 25 MG tablet Commonly known as: SEROQUEL Take 1 tablet (25 mg total) by mouth 2 (two) times daily.   Repatha SureClick 140 MG/ML Soaj Generic drug: Evolocumab INJECT 1 PEN INTO SKIN EVERY 14 DAYS What changed:  how much to take how to take this when to take this additional instructions  rosuvastatin 5 MG tablet Commonly known as: CRESTOR Take 5 mg by mouth every morning.       ASK your doctor about these medications    triamcinolone cream 0.1 % Commonly known as: KENALOG Apply 1 Application topically 2 (two) times daily. Apply to arms, back, and legs for rash for 2 weeks starting 03/17/23 Ask about: Should I take this medication?       Disposition: SNF Diet recommendation: Regular diet  Discharge Exam: Vitals:   03/31/23 1338 03/31/23 2011 04/01/23 0451 04/01/23 0753  BP: 128/61 129/77 (!) 149/77 131/65  Pulse: 71 93 67 75  Resp: 18 20 17 16   Temp: 98.1 F (36.7 C) 98.9 F (37.2 C)  98 F (36.7 C)  TempSrc: Oral Oral    SpO2: 93% 97% 98% 94%  Weight:      Height:       General: Appear in mild distress; no visible Abnormal Neck Mass Or lumps, Conjunctiva normal Cardiovascular: S1 and S2 Present, no Murmur, Respiratory: good respiratory effort, Bilateral Air entry present and  CTA, no Crackles, no wheezes Abdomen: Bowel Sound present, Non tender  Extremities: no Pedal edema Neurology: alert and oriented to self only, chronic dementia, no new focal deficiti Filed Weights   03/28/23 0713 03/29/23 0958  Weight: 87.7 kg 87.7 kg   Condition at discharge: stable  The results of significant diagnostics from this hospitalization (including imaging, microbiology, ancillary and laboratory) are listed below for reference.   Imaging Studies: DG Pelvis Comp Min 3V  Result Date: 03/29/2023 CLINICAL DATA:  Fracture, ORIF EXAM: JUDET PELVIS - 3+ VIEW COMPARISON:  03/28/2023 FINDINGS: Plate and screw fixation device is noted in the right hemipelvis across the acetabular fractures. Anatomic alignment. No subluxation or dislocation. IMPRESSION: Internal fixation right acetabular fractures. No complicating feature. Electronically Signed   By: Charlett Nose M.D.   On: 03/29/2023 20:46   DG Pelvis Comp Min 3V  Result Date: 03/29/2023 CLINICAL DATA:  ORIF of the right acetabulum. Portable operative imaging. EXAM: OPERATIVE RIGHT HIP 6 VIEWS TECHNIQUE: Fluoroscopic spot image(s) were submitted for interpretation post-operatively. COMPARISON:  CT, 03/28/2023. FINDINGS: Portable operative imaging demonstrates placement of this a shin plates and screws along the right acetabulum reducing the comminuted acetabular fracture noted on the previous day's CT. Orthopedic hardware appears well seated. IMPRESSION: 1. Operative imaging provided for right acetabular fracture ORIF. Electronically Signed   By: Amie Portland M.D.   On: 03/29/2023 15:58   DG C-Arm 1-60 Min-No Report  Result Date: 03/29/2023 Fluoroscopy was utilized by the requesting physician.  No radiographic interpretation.   DG C-Arm 1-60 Min-No Report  Result Date: 03/29/2023 Fluoroscopy was utilized by the requesting physician.  No radiographic interpretation.   CT PELVIS WO CONTRAST  Result Date: 03/28/2023 CLINICAL DATA:  Hip  fracture. EXAM: CT PELVIS WITHOUT CONTRAST TECHNIQUE: Multidetector CT imaging of the pelvis was performed following the standard protocol without intravenous contrast. RADIATION DOSE REDUCTION: This exam was performed according to the departmental dose-optimization program which includes automated exposure control, adjustment of the mA and/or kV according to patient size and/or use of iterative reconstruction technique. COMPARISON:  X-ray earlier 03/28/2023.  CT scan 10/02/2022 FINDINGS: Urinary Tract: Enlarged prostate with mass effect along the base of the bladder. Bladder wall is slightly thickened and trabeculated. Bowel: Few sigmoid colon diverticula identified. Scattered colonic stool. The visualized bowel in the pelvis is nondilated. Normal appendix. Vascular/Lymphatic: Diffuse vascular calcifications identified along the iliac vessels and distal aorta. No discrete  abnormal lymph node enlargement identified in the visualized pelvis. Reproductive: Enlarged prostate with mass effect along the base of the bladder. Other: Extraperitoneal hematoma identified along the right hemipelvis. There is also nonspecific stranding in the presacral space Musculoskeletal: Comminuted fracture involving the right acetabulum superior and medial displacement. Anterior and posterior columns of the acetabulum are involved with the fracture as well. Additional area of injury involves the inferior pubic ramus. This is in 2 areas, midportion and towards the pubic symphysis. No additional areas of fracture or dislocation. Mild degenerative changes seen of the sacroiliac joints, left hip joint. Mild hypertrophic changes of the pubic symphysis. Degenerative changes of the visualized lumbar spine with surgical material along the disc spaces at L4-5 and L5-S1. IMPRESSION: Comminuted displaced fracture of the right acetabulum. This involves all portions of the acetabular including medial as well as the anterior and posterior columns.  Separate fracture of the inferior pubic ramus in 2 areas with 1 extending towards the symphysis. Extraperitoneal hematoma along the right hemipelvis. Electronically Signed   By: Karen Kays M.D.   On: 03/28/2023 09:58   DG Chest 1 View  Result Date: 03/28/2023 CLINICAL DATA:  Right hip fracture EXAM: CHEST  1 VIEW COMPARISON:  Previous studies including the examination of 12/26/2022 FINDINGS: Transient diameter of heart is slightly increased. Thoracic aorta is ectatic. Lung fields are clear of any infiltrates or pulmonary edema. There is no pleural effusion or pneumothorax. Deformities are noted in posterior left fourth, fifth and sixth ribs suggesting old healed fractures. There is previous surgical fusion in cervical spine. IMPRESSION: There are no signs of pulmonary edema or focal pulmonary consolidation. Electronically Signed   By: Ernie Avena M.D.   On: 03/28/2023 08:55   DG Hip Unilat  With Pelvis 2-3 Views Right  Result Date: 03/28/2023 CLINICAL DATA:  Trauma, fall.  Painful to bear weight. EXAM: DG HIP (WITH OR WITHOUT PELVIS) 2-3V RIGHT COMPARISON:  Radiographs dated October 02, 2022 FINDINGS: There is cortical irregularity of the acetabular floor and right pubic rami consistent with pelvic fracture. Mild bilateral hip osteoarthritis. Sacroiliac joints and pubic symphysis appear intact. IMPRESSION: Cortical irregularity of the acetabular floor and right pubic rami consistent with pelvic fracture. CT examination for further evaluation is suggested. Electronically Signed   By: Larose [name] D.O.   On: 03/28/2023 08:48   CT HEAD WO CONTRAST  Result Date: 03/28/2023 CLINICAL DATA:  Trauma EXAM: CT HEAD WITHOUT CONTRAST TECHNIQUE: Contiguous axial images were obtained from the base of the skull through the vertex without intravenous contrast. RADIATION DOSE REDUCTION: This exam was performed according to the departmental dose-optimization program which includes automated exposure control,  adjustment of the mA and/or kV according to patient size and/or use of iterative reconstruction technique. COMPARISON:  10/02/2022 FINDINGS: Brain: No acute intracranial findings are seen. There are no signs of bleeding within the cranium. There is interval resolution of small left subdural hematoma. There is prominence of the third and both lateral ventricles. Cortical sulci are prominent. There is decreased density in periventricular and subcortical white matter. Possible small old lacunar infarcts are seen in basal ganglia. There is low-density in the anterior aspect of pons which may be an artifact or old lacunar infarct. Vascular: Scattered arterial calcifications are seen. Skull: No fracture is seen in calvarium. Sinuses/Orbits: There is mucosal thickening in ethmoid, sphenoid and maxillary sinuses. Other: There is increased amount of CSF in the sella suggesting partial empty sella. IMPRESSION: No acute intracranial findings are seen. There  are no signs of bleeding within the cranium. There is no focal mass effect. Atrophy. Small vessel disease. Possible small old lacunar infarct in pons and basal ganglia. Chronic sinusitis. Electronically Signed   By: Ernie Avena M.D.   On: 03/28/2023 08:20    Microbiology: Results for orders placed or performed during the hospital encounter of 03/28/23  Surgical PCR screen     Status: None   Collection Time: 03/28/23  4:29 PM   Specimen: Nasal Mucosa; Nasal Swab  Result Value Ref Range Status   MRSA, PCR NEGATIVE NEGATIVE Final   Staphylococcus aureus NEGATIVE NEGATIVE Final    Comment: (NOTE) The Xpert SA Assay (FDA approved for NASAL specimens in patients 12 years of age and older), is one component of a comprehensive surveillance program. It is not intended to diagnose infection nor to guide or monitor treatment. Performed at [facility], [address] [place], [place] [address]    Labs: CBC: Recent Labs  Lab 03/28/23 0815  03/29/23 2130 03/29/23 1340 03/30/23 0633 03/31/23 0219  WBC 7.5 6.9  --  7.5 7.5  HGB 11.3* 11.2* 9.2* 9.4* 9.4*  HCT 34.5* 34.6* 27.0* 28.1* 27.9*  MCV 96.4 95.1  --  94.9 93.6  PLT 270 264  --  197 221    Basic Metabolic Panel: Recent Labs  Lab 03/28/23 0815 03/29/23 0942 03/29/23 1340 03/30/23 0633 03/31/23 0219  NA 137 137 137 137 135  K 3.9 3.6 6.0* 4.0 3.5  CL 105 104 103 104 102  CO2 24 25  --  25 23  GLUCOSE 105* 112* 137* 148* 112*  BUN 16 19 29* 16 20  CREATININE 1.00 0.91 0.80 0.88 0.77  CALCIUM 8.5* 8.7*  --  8.2* 8.3*  MG  --  2.1  --  2.0 2.1    Liver Function Tests: Recent Labs  Lab 03/28/23 0815  AST 17  ALT 16  ALKPHOS 56  BILITOT 0.6  PROT 6.8  ALBUMIN 3.6    CBG: No results for input(s): "GLUCAP" in the last 168 hours.  Discharge time spent: greater than 30 minutes.  Signed: Lynden Oxford, MD [facility]

## 2023-04-01 NOTE — TOC Progression Note (Addendum)
Transition of Care [facility]) - Progression Note    Patient Details  Name: [name] MRN: [id number] Date of Birth: 02-Sep-1933  [facility]) CM/SW Contact  Lorri Frederick, LCSW Phone Number: 04/01/2023, 10:55 AM  Clinical Narrative:    TC [facility].  CSW called regarding pt return today.  [place] asking for clarification on whether pt is or is not on mitts, confirmed pt is not on mitts, despite respiratory note as confirmed by MD.  She is asking for another note addressing this, MD informed.  1015: [facility] talking to DON, OK with mitts issue.  Asked why pt DC on depakote and ativan.  MD response requested.  Per MD, ativan and depakote expired 5/15 and not needed while at [facility].  This was passed on to [place].  1050: [facility] requesting ativan be restarted.  Spoke to MD who declines this.  [place] informed and will update her DON  1115: TC [place], they are ready to receive pt, will work with their MD on ativan  Expected Discharge Plan: Memory Care Barriers to Discharge: Continued Medical Work up  Expected Discharge Plan and Services In-house Referral: Clinical Social Work   Post Acute Care Choice: Resumption of Paediatric nurse ([facility]) Living arrangements for the past 2 months: Assisted Living Facility Expected Discharge Date: 03/31/23                                     Social Determinants of Health (SDOH) Interventions SDOH Screenings   Food Insecurity: No Food Insecurity (03/28/2023)  Housing: Low Risk  (03/28/2023)  Transportation Needs: No Transportation Needs (03/28/2023)  Utilities: Not At Risk (03/28/2023)  Alcohol Screen: Low Risk  (06/25/2022)  Depression (PHQ2-9): Low Risk  (11/02/2022)  Financial Resource Strain: Low Risk  (06/25/2022)  Physical Activity: Insufficiently Active (06/25/2022)  Social Connections: Moderately Integrated (06/25/2022)  Stress: No Stress Concern Present (06/25/2022)  Tobacco Use: Medium Risk  (03/31/2023)    Readmission Risk Interventions     No data to display

## 2023-04-01 NOTE — Plan of Care (Signed)
  Problem: Education: Goal: Knowledge of General Education information will improve Description: Including pain rating scale, medication(s)/side effects and non-pharmacologic comfort measures 04/01/2023 1153 by Pershing Proud, RN Outcome: Adequate for Discharge 04/01/2023 1153 by Pershing Proud, RN Outcome: Adequate for Discharge   Problem: Health Behavior/Discharge Planning: Goal: Ability to manage health-related needs will improve 04/01/2023 1153 by Pershing Proud, RN Outcome: Adequate for Discharge 04/01/2023 1153 by Pershing Proud, RN Outcome: Adequate for Discharge   Problem: Clinical Measurements: Goal: Ability to maintain clinical measurements within normal limits will improve 04/01/2023 1153 by Pershing Proud, RN Outcome: Adequate for Discharge 04/01/2023 1153 by Pershing Proud, RN Outcome: Adequate for Discharge Goal: Will remain free from infection 04/01/2023 1153 by Pershing Proud, RN Outcome: Adequate for Discharge 04/01/2023 1153 by Pershing Proud, RN Outcome: Adequate for Discharge Goal: Diagnostic test results will improve 04/01/2023 1153 by Pershing Proud, RN Outcome: Adequate for Discharge 04/01/2023 1153 by Pershing Proud, RN Outcome: Adequate for Discharge Goal: Respiratory complications will improve 04/01/2023 1153 by Pershing Proud, RN Outcome: Adequate for Discharge 04/01/2023 1153 by Pershing Proud, RN Outcome: Adequate for Discharge Goal: Cardiovascular complication will be avoided 04/01/2023 1153 by Pershing Proud, RN Outcome: Adequate for Discharge 04/01/2023 1153 by Pershing Proud, RN Outcome: Adequate for Discharge   Problem: Activity: Goal: Risk for activity intolerance will decrease 04/01/2023 1153 by Pershing Proud, RN Outcome: Adequate for Discharge 04/01/2023 1153 by Pershing Proud, RN Outcome: Adequate for Discharge   Problem: Nutrition: Goal: Adequate nutrition will be maintained 04/01/2023 1153 by  Pershing Proud, RN Outcome: Adequate for Discharge 04/01/2023 1153 by Pershing Proud, RN Outcome: Adequate for Discharge   Problem: Coping: Goal: Level of anxiety will decrease 04/01/2023 1153 by Pershing Proud, RN Outcome: Adequate for Discharge 04/01/2023 1153 by Pershing Proud, RN Outcome: Adequate for Discharge   Problem: Elimination: Goal: Will not experience complications related to bowel motility 04/01/2023 1153 by Pershing Proud, RN Outcome: Adequate for Discharge 04/01/2023 1153 by Pershing Proud, RN Outcome: Adequate for Discharge Goal: Will not experience complications related to urinary retention 04/01/2023 1153 by Pershing Proud, RN Outcome: Adequate for Discharge 04/01/2023 1153 by Pershing Proud, RN Outcome: Adequate for Discharge   Problem: Pain Managment: Goal: General experience of comfort will improve 04/01/2023 1153 by Pershing Proud, RN Outcome: Adequate for Discharge 04/01/2023 1153 by Pershing Proud, RN Outcome: Adequate for Discharge   Problem: Safety: Goal: Ability to remain free from injury will improve 04/01/2023 1153 by Pershing Proud, RN Outcome: Adequate for Discharge 04/01/2023 1153 by Pershing Proud, RN Outcome: Adequate for Discharge   Problem: Skin Integrity: Goal: Risk for impaired skin integrity will decrease 04/01/2023 1153 by Pershing Proud, RN Outcome: Adequate for Discharge 04/01/2023 1153 by Pershing Proud, RN Outcome: Adequate for Discharge

## 2023-04-04 ENCOUNTER — Non-Acute Institutional Stay (SKILLED_NURSING_FACILITY): Payer: Medicare Other | Admitting: Adult Health

## 2023-04-04 ENCOUNTER — Encounter: Payer: Self-pay | Admitting: Adult Health

## 2023-04-04 DIAGNOSIS — M62561 Muscle wasting and atrophy, not elsewhere classified, right lower leg: Secondary | ICD-10-CM | POA: Diagnosis not present

## 2023-04-04 DIAGNOSIS — R41841 Cognitive communication deficit: Secondary | ICD-10-CM | POA: Diagnosis not present

## 2023-04-04 DIAGNOSIS — M069 Rheumatoid arthritis, unspecified: Secondary | ICD-10-CM | POA: Diagnosis not present

## 2023-04-04 DIAGNOSIS — F02B3 Dementia in other diseases classified elsewhere, moderate, with mood disturbance: Secondary | ICD-10-CM

## 2023-04-04 DIAGNOSIS — S32401K Unspecified fracture of right acetabulum, subsequent encounter for fracture with nonunion: Secondary | ICD-10-CM | POA: Diagnosis not present

## 2023-04-04 DIAGNOSIS — S32411D Displaced fracture of anterior wall of right acetabulum, subsequent encounter for fracture with routine healing: Secondary | ICD-10-CM | POA: Diagnosis not present

## 2023-04-04 DIAGNOSIS — I10 Essential (primary) hypertension: Secondary | ICD-10-CM

## 2023-04-04 DIAGNOSIS — R278 Other lack of coordination: Secondary | ICD-10-CM | POA: Diagnosis not present

## 2023-04-04 DIAGNOSIS — I480 Paroxysmal atrial fibrillation: Secondary | ICD-10-CM

## 2023-04-04 DIAGNOSIS — I2510 Atherosclerotic heart disease of native coronary artery without angina pectoris: Secondary | ICD-10-CM

## 2023-04-04 DIAGNOSIS — G308 Other Alzheimer's disease: Secondary | ICD-10-CM | POA: Diagnosis not present

## 2023-04-04 DIAGNOSIS — S32599A Other specified fracture of unspecified pubis, initial encounter for closed fracture: Secondary | ICD-10-CM

## 2023-04-04 DIAGNOSIS — E782 Mixed hyperlipidemia: Secondary | ICD-10-CM | POA: Diagnosis not present

## 2023-04-04 DIAGNOSIS — R296 Repeated falls: Secondary | ICD-10-CM | POA: Diagnosis not present

## 2023-04-04 DIAGNOSIS — M6389 Disorders of muscle in diseases classified elsewhere, multiple sites: Secondary | ICD-10-CM | POA: Diagnosis not present

## 2023-04-04 DIAGNOSIS — S32591D Other specified fracture of right pubis, subsequent encounter for fracture with routine healing: Secondary | ICD-10-CM | POA: Diagnosis not present

## 2023-04-04 DIAGNOSIS — M79651 Pain in right thigh: Secondary | ICD-10-CM | POA: Diagnosis not present

## 2023-04-04 DIAGNOSIS — M79642 Pain in left hand: Secondary | ICD-10-CM | POA: Insufficient documentation

## 2023-04-04 DIAGNOSIS — F4321 Adjustment disorder with depressed mood: Secondary | ICD-10-CM | POA: Diagnosis not present

## 2023-04-04 DIAGNOSIS — R4189 Other symptoms and signs involving cognitive functions and awareness: Secondary | ICD-10-CM | POA: Diagnosis not present

## 2023-04-04 DIAGNOSIS — R2681 Unsteadiness on feet: Secondary | ICD-10-CM | POA: Diagnosis not present

## 2023-04-04 MED ORDER — ACETAMINOPHEN 325 MG PO TABS: 650.0000 mg | ORAL_TABLET | Freq: Two times a day (BID) | ORAL | 0 refills | Status: AC

## 2023-04-04 MED ORDER — ACETAMINOPHEN 500 MG PO TABS: 1000.0000 mg | ORAL_TABLET | Freq: Two times a day (BID) | ORAL | 0 refills | Status: DC

## 2023-04-04 MED ORDER — LIDOCAINE 4 % EX PTCH: 1.0000 | MEDICATED_PATCH | CUTANEOUS | 0 refills | Status: DC

## 2023-04-04 NOTE — Progress Notes (Signed)
Location:  [facility] Room Number: 306A Place of Service:  SNF [phone]) Provider:  Tamsen Roers, MD  Patient Care Team: [name], MD as PCP - General (Internal Medicine) Quintella Reichert, MD as PCP - Cardiology (Cardiology) Hillis Range, MD (Inactive) as PCP - Electrophysiology (Cardiology) Hilarie Fredrickson, MD (Gastroenterology) Hillis Range, MD (Inactive) (Cardiology) Donnetta Hail, MD as Consulting Physician (Rheumatology) Berneice Heinrich Delbert Phenix., MD as Consulting Physician (Urology) Antony Contras, MD as Consulting Physician (Ophthalmology) Dannielle Burn (Dentistry) Eileen Stanford, MD as Referring Physician (Allergy and Immunology) Van Clines, MD as Consulting Physician (Neurology) Kathyrn Sheriff, [facility] as Pharmacist (Pharmacist) Van Clines, MD as Consulting Physician (Neurology)  Extended Emergency Contact Information Primary Emergency Contact: Wandler,[name] Address: [address]          [place], [place] Home Phone: [phone] Work Phone: [phone] Mobile Phone: [phone] Relation: Spouse Secondary Emergency Contact: [name],[name] Mobile Phone: [phone] Relation: Daughter  Code Status:  DNR Goals of care: Advanced Directive information    04/04/2023    9:35 AM  Advanced Directives  Does Patient Have a Medical Advance Directive? Yes  Type of Estate agent of Uncertain;Living will;Out of facility DNR (pink MOST or yellow form)  Does patient want to make changes to medical advance directive? No - Patient declined  Copy of Healthcare Power of Attorney in Chart? Yes - validated most recent copy scanned in chart (See row information)  Pre-existing out of facility DNR order (yellow form or pink MOST form) Yellow form placed in chart (order not valid for inpatient use)     Chief Complaint  Patient presents with   Acute Visit    Patient is  being seen for acute s/p hospitalization     HPI:  [name] is a 89 y.o. male seen in skilled rehab s/p hospitalization.   PMH Afib on eliquis,  hx of CAD with PCI, NSTEMI. GERD, falls  Hx of sleep apnea, no longer using cpap RA on Orencia, follows with Rheumatology HLD on Repatha, zetia, crestor Hx of depression on lexapro.  Dementia with behaviors. Off depakote due to rash. On Seroquel and ativan.  He previously lived in the memory care unit due to dementia. He had unwitnessed fall which led to a right acetabular fracture and pubic rami fracture. Admitted to the hospital 03/28/23-04/01/23.  Underwent ORIF 03/29/23 [place] approach by Dr Carola Frost. During his hospitalization he did have some delirium and needed restraints. Also had 1 unit of PRBC transfused. Last Hgb  9.4 03/30/23.  He is in skilled rehab for therapy. Mood is stable. Does have difficulty following direction for TWB to the right leg. Pain is well controlled. Does have some low back pain from lying in bed and the nurse is requesting a lidocaine patch. His tylenol dosing needs to be adjusted also due to his age.  Past Medical History:  Diagnosis Date   CAD (coronary artery disease)    a. s/p inferior STEMI 01/08/2014; LHC 01/08/14: total RCA occlusion s/p 3.5x25mm Xience DES distal RCA and 3.5x28 mm DES mid RCA, 60-70% mid LAD stenosis, EF 55%.   Complication of anesthesia    'long to wake up after back surgery " 09/2003   Diverticulosis of colon    GERD (gastroesophageal reflux disease)    History of cellulitis    05-26-2015  LLE   History of colon polyps    1998- benign/  2008  adenomatous    History of kidney stones    2013   History of squamous cell carcinoma excision    2013;  2015;  06-12-2015 right leg/  02/ and 05/ 2017  left ear and left leg   Hydronephrosis, left    Hyperlipidemia    Hypertension    Migraine with aura    OSA on CPAP 06/19/2015   Moderate OSA with AHI 18/hr  per study 05-20-2015   Osteoarthritis     Paroxysmal atrial fibrillation (HCC) 4/16   chads2vasc score is at least 4   Premature atrial contractions    RA (rheumatoid arthritis) [facility])    rheumatologist-  dr Alben Deeds   Sinusitis, chronic 10/17/2015   Wears glasses    Wears hearing aid    bilateral   Past Surgical History:  Procedure Laterality Date   BACK SURGERY     CARDIOVASCULAR STRESS TEST  11/21/2015   Low risk nuclear study w/ a small diaphragmatic attenuation artifact, no ischemia/  normal LV function and wall motion , ef 63%   CATARACT EXTRACTION W/ INTRAOCULAR LENS  IMPLANT, BILATERAL  2015   COLONOSCOPY  last one 06-09-2011   CORONARY ANGIOPLASTY     CYSTOSCOPY W/ RETROGRADES Left 07/12/2016   Procedure: CYSTOSCOPY WITH RETROGRADE PYELOGRAM LEFT URETERAL STENT;  Surgeon: Bjorn Pippin, MD;  Location: [facility];  Service: Urology;  Laterality: Left;   CYSTOSCOPY WITH RETROGRADE PYELOGRAM, URETEROSCOPY AND STENT PLACEMENT Left 08/11/2016   Procedure: CYSTOSCOPY WITH RETROGRADE PYELOGRAM,  DIAGNOSTIC URETEROSCOPY , STENT EXCHANGE;  Surgeon: Sebastian Ache, MD;  Location: [facility];  Service: Urology;  Laterality: Left;   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   severe fibromatosis palm and fingers   LEFT HEART CATHETERIZATION WITH CORONARY ANGIOGRAM N/A 01/08/2014   Procedure: LEFT HEART CATHETERIZATION WITH CORONARY ANGIOGRAM;  Surgeon: Lennette Bihari, MD;  Location: [facility] CATH LAB;  Service: Cardiovascular;  Laterality:N/A;  total/ subtotal RCA/  mLAD 60-70% w/ mid systolic bridging/  preserved global LVF, ef 55%   MOHS SURGERY  x2  feb and may 2017   left ear /  left leg  ([facility])   ORIF ACETABULAR FRACTURE Right 03/29/2023   Procedure: OPEN REDUCTION INTERNAL FIXATION (ORIF) ACETABULAR FRACTURE;  Surgeon: Myrene Galas, MD;  Location: [facility] OR;  Service: Orthopedics;  Laterality: Right;   PERCUTANEOUS CORONARY STENT INTERVENTION (PCI-S)  01/08/2014   Procedure: PERCUTANEOUS CORONARY STENT INTERVENTION  (PCI-S);  Surgeon: Lennette Bihari, MD;  Location: [facility] CATH LAB;  Service: Cardiovascular;;  DES to mid and distal RCA   POSTERIOR LUMBAR FUSION  10/01/2003   and Laminectomy/ diskectomy  L4 -- S1   ROBOT ASSISTED PYELOPLASTY Left 09/15/2016   Procedure: XI ROBOTIC ASSISTED PYELOPLASTY;  Surgeon: Sebastian Ache, MD;  Location: [facility];  Service: Urology;  Laterality: Left;   TONSILLECTOMY     TRANSTHORACIC ECHOCARDIOGRAM  02/23/2016   mild LVH,  ef 50-55%,  grade 1 diastolic dysfunction/  mild to moderate AV calcification w/ no stenosis or regurg./  trivial MR and TR/  mild PR    Allergies  Allergen Reactions   Methotrexate Other (See Comments)    REACTION: tachycardia   Aricept [Donepezil Hcl] Other (See Comments)    Nightmares. [name] takes this medication per [facility]    Crestor [Rosuvastatin Calcium] Other (See Comments)    Myalgias with 20mg  dose   Lisinopril Other (See Comments)    cough   Namenda [Memantine] Diarrhea and Nausea Only   Atorvastatin Other (  See Comments)    Muscle pain, leg cramps   Pravastatin Sodium Other (See Comments)    REACTION: cramps, fatigue    Outpatient Encounter Medications as of 04/04/2023  Medication Sig   acetaminophen (TYLENOL) 500 MG tablet Take 1 tablet (500 mg total) by mouth every 8 (eight) hours as needed.   acetaminophen (TYLENOL) 500 MG tablet Take 2 tablets (1,000 mg total) by mouth in the morning, at noon, and at bedtime for 7 days.   apixaban (ELIQUIS) 5 MG TABS tablet Take 1 tablet (5 mg total) by mouth 2 (two) times daily.   docusate sodium (COLACE) 100 MG capsule Take 1 capsule (100 mg total) by mouth 2 (two) times daily.   donepezil (ARICEPT) 5 MG tablet TAKE ONE TABLET BY MOUTH EVERY MORNING (Patient taking differently: Take 5 mg by mouth every evening.)   escitalopram (LEXAPRO) 20 MG tablet Take 20 mg by mouth daily.   Evolocumab (REPATHA SURECLICK) 140 MG/ML SOAJ INJECT 1 PEN INTO SKIN EVERY 14 DAYS (Patient taking differently: Inject 140 mg  into the skin every 14 (fourteen) days.)   ezetimibe (ZETIA) 10 MG tablet Take 5 mg by mouth daily.   feeding supplement (ENSURE ENLIVE / ENSURE PLUS) LIQD Take 237 mLs by mouth 3 (three) times daily between meals.   gabapentin (NEURONTIN) 300 MG capsule Take 1 capsule (300 mg total) by mouth 3 (three) times daily.   LORazepam (ATIVAN) 0.5 MG tablet Take 0.5 mg by mouth 2 (two) times daily.   magnesium hydroxide (MILK OF MAGNESIA) 400 MG/5ML suspension Take by mouth daily as needed for mild constipation.   melatonin 5 MG TABS Take 5 mg by mouth at bedtime.   methocarbamol (ROBAXIN) 500 MG tablet Take 1 tablet (500 mg total) by mouth every 6 (six) hours as needed for muscle spasms.   metoprolol tartrate (LOPRESSOR) 25 MG tablet Take 25 mg by mouth at bedtime.   Multiple Vitamin (MULTIVITAMIN WITH MINERALS) TABS tablet Take 1 tablet by mouth daily.   nitroGLYCERIN (NITROSTAT) 0.4 MG SL tablet Place 0.4 mg under the tongue every 5 (five) minutes as needed for chest pain.   omeprazole (PRILOSEC) 40 MG capsule Take 40 mg by mouth in the morning. Take on empty stomach 30 minutes before breakfast.   oxyCODONE-acetaminophen (PERCOCET) 5-325 MG tablet Take 1 tablet by mouth every 6 (six) hours as needed for severe pain or moderate pain.   QUEtiapine (SEROQUEL) 25 MG tablet Take 1 tablet (25 mg total) by mouth 2 (two) times daily.   rosuvastatin (CRESTOR) 5 MG tablet Take 5 mg by mouth every morning.   [DISCONTINUED] apixaban (ELIQUIS) 2.5 MG TABS tablet Take 1 tablet (2.5 mg total) by mouth 2 (two) times daily.   No facility-administered encounter medications on file as of 04/04/2023.    Review of Systems  Constitutional:  Negative for activity change, appetite change, chills, diaphoresis, fatigue, fever and unexpected weight change.  Respiratory:  Negative for cough, shortness of breath, wheezing and stridor.   Cardiovascular:  Negative for chest pain, palpitations and leg swelling.  Gastrointestinal:   Negative for abdominal distention, abdominal pain, constipation and diarrhea.  Genitourinary:  Negative for difficulty urinating and dysuria.  Musculoskeletal:  Positive for arthralgias, back pain and gait problem. Negative for joint swelling and myalgias.  Neurological:  Negative for dizziness, seizures, syncope, facial asymmetry, speech difficulty, weakness and headaches.  Hematological:  Negative for adenopathy. Does not bruise/bleed easily.  Psychiatric/Behavioral:  Positive for agitation, behavioral problems and confusion.  Immunization History  Administered Date(s) Administered   Fluad Quad(high Dose 65+) 07/09/2019, 08/15/2020, 09/16/2021, 07/29/2022   Influenza Split 08/10/2011, 08/15/2012   Influenza Whole 08/21/2009, 09/22/2010   Influenza, High Dose Seasonal PF 09/16/2015, 08/18/2016, 08/31/2017, 08/14/2018   Influenza,inj,Quad PF,6+ Mos 08/06/2013   Influenza-Unspecified 08/31/2017   PFIZER(Purple Top)SARS-COV-2 Vaccination 12/11/2019, 01/01/2020, 08/12/2020   Pneumococcal Conjugate-13 10/02/2013   Pneumococcal Polysaccharide-23 06/04/2010   Tdap 10/22/2011, 09/22/2022   Zoster Recombinat (Shingrix) 03/09/2017, 05/10/2017   Pertinent  Health Maintenance Due  Topic Date Due   INFLUENZA VACCINE  06/16/2023      10/05/2022    3:40 PM 10/12/2022    9:31 AM 11/01/2022    2:52 PM 11/02/2022    9:04 AM 11/30/2022    9:57 AM  Fall Risk  Falls in the past year? 1 1 1 1 1   Was there an injury with Fall? 1 1 1 1 1   Fall Risk Category Calculator 3 3 3 3 2   Fall Risk Category (Retired) [employer]   (RETIRED) Patient Fall Risk Level High fall risk High fall risk High fall risk High fall risk   Patient at Risk for Falls Due to History of fall(s);Impaired balance/gait History of fall(s);Impaired balance/gait;Impaired mobility  History of fall(s);Impaired balance/gait;Impaired mobility No Fall Risks  Fall risk Follow up Falls evaluation completed Falls evaluation  completed Falls evaluation completed Falls evaluation completed Falls evaluation completed   Functional Status Survey:    Vitals:   04/04/23 0934  BP: 117/74  Pulse: 96  Resp: 15  Temp: 98.2 F (36.8 C)  TempSrc: Temporal  SpO2: 94%  Weight: 193 lb 6.4 oz (87.7 kg)  Height: 6' (1.829 m)   Body mass index is 26.23 kg/m. Physical Exam Vitals and nursing note reviewed.  Constitutional:      General: He is not in acute distress.    Appearance: He is not diaphoretic.  HENT:     Head: Normocephalic and atraumatic.     Nose: Nose normal.     Mouth/Throat:     Mouth: Mucous membranes are moist.     Pharynx: Oropharynx is clear.  Eyes:     Conjunctiva/sclera: Conjunctivae normal.     Pupils: Pupils are equal, round, and reactive to light.  Neck:     Thyroid: No thyromegaly.     Vascular: No JVD.     Trachea: No tracheal deviation.  Cardiovascular:     Rate and Rhythm: Normal rate and regular rhythm.     Heart sounds: No murmur heard. Pulmonary:     Effort: Pulmonary effort is normal. No respiratory distress.     Breath sounds: Normal breath sounds. No wheezing.  Abdominal:     General: Bowel sounds are normal. There is no distension.     Palpations: Abdomen is soft.     Tenderness: There is no abdominal tenderness.  Lymphadenopathy:     Cervical: No cervical adenopathy.  Skin:    General: Skin is warm and dry.     Comments: Low abd incision covered by dressing CDI  Neurological:     General: No focal deficit present.     Mental Status: He is alert. Mental status is at baseline.     Cranial Nerves: No cranial nerve deficit.     Labs reviewed: Recent Labs    03/29/23 0942 03/29/23 1340 03/30/23 0633 03/31/23 0219  NA 137 137 137 135  K 3.6 6.0* 4.0 3.5  CL 104 103 104 102  CO2 25  --  25 23  GLUCOSE 112* 137* 148* 112*  BUN 19 29* 16 20  CREATININE 0.91 0.80 0.88 0.77  CALCIUM 8.7*  --  8.2* 8.3*  MG 2.1  --  2.0 2.1   Recent Labs    10/02/22 0350  10/11/22 0600 12/26/22 1414 01/26/23 0000 03/28/23 0815  AST 24   < > 30 19 17   ALT 16   < > 12 12 16   ALKPHOS 61   < > 85 79 56  BILITOT 0.6  --  1.0  --  0.6  PROT 7.0  --  7.8  --  6.8  ALBUMIN 4.0   < > 3.8 3.8 3.6   < > = values in this interval not displayed.   Recent Labs    07/29/22 1345 09/24/22 0000 12/26/22 1414 03/28/23 0815 03/29/23 0942 03/29/23 1340 03/30/23 0633 03/31/23 0219  WBC 5.3   < > 8.1   < > 6.9  --  7.5 7.5  NEUTROABS 2.7  --  6.2  --   --   --   --   --   HGB 12.8*   < > 12.7*   < > 11.2* 9.2* 9.4* 9.4*  HCT 38.2*   < > 38.3*   < > 34.6* 27.0* 28.1* 27.9*  MCV 99.3   < > 97.0   < > 95.1  --  94.9 93.6  PLT 274.0   < > 272   < > 264  --  197 221   < > = values in this interval not displayed.   Lab Results  Component Value Date   TSH 0.79 01/26/2023   Lab Results  Component Value Date   HGBA1C 6.4 08/30/2017   Lab Results  Component Value Date   CHOL 126 01/26/2023   HDL 36 01/26/2023   LDLCALC 39 01/26/2023   LDLDIRECT 168.9 05/24/2013   TRIG 254 (A) 01/26/2023   CHOLHDL 4.2 08/04/2020    Significant Diagnostic Results in last 30 days:  DG Pelvis Comp Min 3V  Result Date: 03/29/2023 CLINICAL DATA:  Fracture, ORIF EXAM: JUDET PELVIS - 3+ VIEW COMPARISON:  03/28/2023 FINDINGS: Plate and screw fixation device is noted in the right hemipelvis across the acetabular fractures. Anatomic alignment. No subluxation or dislocation. IMPRESSION: Internal fixation right acetabular fractures. No complicating feature. Electronically Signed   By: Charlett Nose M.D.   On: 03/29/2023 20:46   DG Pelvis Comp Min 3V  Result Date: 03/29/2023 CLINICAL DATA:  ORIF of the right acetabulum. Portable operative imaging. EXAM: OPERATIVE RIGHT HIP 6 VIEWS TECHNIQUE: Fluoroscopic spot image(s) were submitted for interpretation post-operatively. COMPARISON:  CT, 03/28/2023. FINDINGS: Portable operative imaging demonstrates placement of this a shin plates and screws  along the right acetabulum reducing the comminuted acetabular fracture noted on the previous day's CT. Orthopedic hardware appears well seated. IMPRESSION: 1. Operative imaging provided for right acetabular fracture ORIF. Electronically Signed   By: Amie Portland M.D.   On: 03/29/2023 15:58   DG C-Arm 1-60 Min-No Report  Result Date: 03/29/2023 Fluoroscopy was utilized by the requesting physician.  No radiographic interpretation.   DG C-Arm 1-60 Min-No Report  Result Date: 03/29/2023 Fluoroscopy was utilized by the requesting physician.  No radiographic interpretation.   CT PELVIS WO CONTRAST  Result Date: 03/28/2023 CLINICAL DATA:  Hip fracture. EXAM: CT PELVIS WITHOUT CONTRAST TECHNIQUE: Multidetector CT imaging of the pelvis was performed following the standard protocol without intravenous contrast. RADIATION DOSE REDUCTION: This exam was performed according  to the departmental dose-optimization program which includes automated exposure control, adjustment of the mA and/or kV according to patient size and/or use of iterative reconstruction technique. COMPARISON:  X-ray earlier 03/28/2023.  CT scan 10/02/2022 FINDINGS: Urinary Tract: Enlarged prostate with mass effect along the base of the bladder. Bladder wall is slightly thickened and trabeculated. Bowel: Few sigmoid colon diverticula identified. Scattered colonic stool. The visualized bowel in the pelvis is nondilated. Normal appendix. Vascular/Lymphatic: Diffuse vascular calcifications identified along the iliac vessels and distal aorta. No discrete abnormal lymph node enlargement identified in the visualized pelvis. Reproductive: Enlarged prostate with mass effect along the base of the bladder. Other: Extraperitoneal hematoma identified along the right hemipelvis. There is also nonspecific stranding in the presacral space Musculoskeletal: Comminuted fracture involving the right acetabulum superior and medial displacement. Anterior and posterior  columns of the acetabulum are involved with the fracture as well. Additional area of injury involves the inferior pubic ramus. This is in 2 areas, midportion and towards the pubic symphysis. No additional areas of fracture or dislocation. Mild degenerative changes seen of the sacroiliac joints, left hip joint. Mild hypertrophic changes of the pubic symphysis. Degenerative changes of the visualized lumbar spine with surgical material along the disc spaces at L4-5 and L5-S1. IMPRESSION: Comminuted displaced fracture of the right acetabulum. This involves all portions of the acetabular including medial as well as the anterior and posterior columns. Separate fracture of the inferior pubic ramus in 2 areas with 1 extending towards the symphysis. Extraperitoneal hematoma along the right hemipelvis. Electronically Signed   By: Karen Kays M.D.   On: 03/28/2023 09:58   DG Chest 1 View  Result Date: 03/28/2023 CLINICAL DATA:  Right hip fracture EXAM: CHEST  1 VIEW COMPARISON:  Previous studies including the examination of 12/26/2022 FINDINGS: Transient diameter of heart is slightly increased. Thoracic aorta is ectatic. Lung fields are clear of any infiltrates or pulmonary edema. There is no pleural effusion or pneumothorax. Deformities are noted in posterior left fourth, fifth and sixth ribs suggesting old healed fractures. There is previous surgical fusion in cervical spine. IMPRESSION: There are no signs of pulmonary edema or focal pulmonary consolidation. Electronically Signed   By: Ernie Avena M.D.   On: 03/28/2023 08:55   DG Hip Unilat  With Pelvis 2-3 Views Right  Result Date: 03/28/2023 CLINICAL DATA:  Trauma, fall.  Painful to bear weight. EXAM: DG HIP (WITH OR WITHOUT PELVIS) 2-3V RIGHT COMPARISON:  Radiographs dated October 02, 2022 FINDINGS: There is cortical irregularity of the acetabular floor and right pubic rami consistent with pelvic fracture. Mild bilateral hip osteoarthritis. Sacroiliac  joints and pubic symphysis appear intact. IMPRESSION: Cortical irregularity of the acetabular floor and right pubic rami consistent with pelvic fracture. CT examination for further evaluation is suggested. Electronically Signed   By: Larose [name] D.O.   On: 03/28/2023 08:48   CT HEAD WO CONTRAST  Result Date: 03/28/2023 CLINICAL DATA:  Trauma EXAM: CT HEAD WITHOUT CONTRAST TECHNIQUE: Contiguous axial images were obtained from the base of the skull through the vertex without intravenous contrast. RADIATION DOSE REDUCTION: This exam was performed according to the departmental dose-optimization program which includes automated exposure control, adjustment of the mA and/or kV according to patient size and/or use of iterative reconstruction technique. COMPARISON:  10/02/2022 FINDINGS: Brain: No acute intracranial findings are seen. There are no signs of bleeding within the cranium. There is interval resolution of small left subdural hematoma. There is prominence of the third and both lateral  ventricles. Cortical sulci are prominent. There is decreased density in periventricular and subcortical white matter. Possible small old lacunar infarcts are seen in basal ganglia. There is low-density in the anterior aspect of pons which may be an artifact or old lacunar infarct. Vascular: Scattered arterial calcifications are seen. Skull: No fracture is seen in calvarium. Sinuses/Orbits: There is mucosal thickening in ethmoid, sphenoid and maxillary sinuses. Other: There is increased amount of CSF in the sella suggesting partial empty sella. IMPRESSION: No acute intracranial findings are seen. There are no signs of bleeding within the cranium. There is no focal mass effect. Atrophy. Small vessel disease. Possible small old lacunar infarct in pons and basal ganglia. Chronic sinusitis. Electronically Signed   By: Ernie Avena M.D.   On: 03/28/2023 08:20    Assessment/Plan  1. Closed displaced fracture of right  acetabulum with nonunion, unspecified portion of acetabulum, subsequent encounter S/p repair TWB per ortho which is going to be difficult for him to implement given his underlying dementia. Staff to work with therapy.  F/U for suture removal with ortho  [name] OT ordered  Tylenol and percocet for pain control  2. Closed stable fracture of multiple pubic rami (HCC) As above  Also ordered lidocaine patch   3. Paroxysmal atrial fibrillation (HCC) Currently on Eliquis for CVA risk reduction Continue lopressor   4. Coronary artery disease involving native coronary artery of native heart without angina pectoris S/p PCI On statin, eliquis   5. 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 Rheumatoid arthritis involving elbow, unspecified laterality, unspecified whether rheumatoid factor present (HCC) Followed by Rheumatology Not currently on medication   7. Mixed hyperlipidemia Lab Results  Component Value Date   LDLCALC 39 01/26/2023   Continue repatha and zetia   8. Primary hypertension Controlled Continue lopressor    Family/ staff Communication: nurse  Labs/tests ordered:  CBC BMP in am

## 2023-04-05 DIAGNOSIS — M79651 Pain in right thigh: Secondary | ICD-10-CM | POA: Diagnosis not present

## 2023-04-05 DIAGNOSIS — S32591D Other specified fracture of right pubis, subsequent encounter for fracture with routine healing: Secondary | ICD-10-CM | POA: Diagnosis not present

## 2023-04-05 DIAGNOSIS — R4189 Other symptoms and signs involving cognitive functions and awareness: Secondary | ICD-10-CM | POA: Diagnosis not present

## 2023-04-05 DIAGNOSIS — S32411D Displaced fracture of anterior wall of right acetabulum, subsequent encounter for fracture with routine healing: Secondary | ICD-10-CM | POA: Diagnosis not present

## 2023-04-05 DIAGNOSIS — M6389 Disorders of muscle in diseases classified elsewhere, multiple sites: Secondary | ICD-10-CM | POA: Diagnosis not present

## 2023-04-05 DIAGNOSIS — F4321 Adjustment disorder with depressed mood: Secondary | ICD-10-CM | POA: Diagnosis not present

## 2023-04-05 DIAGNOSIS — D649 Anemia, unspecified: Secondary | ICD-10-CM | POA: Diagnosis not present

## 2023-04-05 LAB — CBC AND DIFFERENTIAL
HCT: 30 — AB (ref 41–53)
Hemoglobin: 10.1 — AB (ref 13.5–17.5)
Platelets: 434 10*3/uL — AB (ref 150–400)
WBC: 7.8

## 2023-04-05 LAB — COMPREHENSIVE METABOLIC PANEL: Calcium: 9 (ref 8.7–10.7)

## 2023-04-05 LAB — VITAMIN D 25 HYDROXY (VIT D DEFICIENCY, FRACTURES)

## 2023-04-05 LAB — BASIC METABOLIC PANEL
BUN: 19 (ref 4–21)
CO2: 23 — AB (ref 13–22)
Chloride: 99 (ref 99–108)
Creatinine: 0.8 (ref 0.6–1.3)
Glucose: 117
Potassium: 4 mEq/L (ref 3.5–5.1)
Sodium: 133 — AB (ref 137–147)

## 2023-04-05 LAB — CBC: RBC: 3.14 — AB (ref 3.87–5.11)

## 2023-04-06 ENCOUNTER — Non-Acute Institutional Stay (SKILLED_NURSING_FACILITY): Payer: Medicare Other | Admitting: Orthopedic Surgery

## 2023-04-06 ENCOUNTER — Encounter: Payer: Self-pay | Admitting: Orthopedic Surgery

## 2023-04-06 DIAGNOSIS — S32401K Unspecified fracture of right acetabulum, subsequent encounter for fracture with nonunion: Secondary | ICD-10-CM | POA: Diagnosis not present

## 2023-04-06 DIAGNOSIS — S32591D Other specified fracture of right pubis, subsequent encounter for fracture with routine healing: Secondary | ICD-10-CM | POA: Diagnosis not present

## 2023-04-06 DIAGNOSIS — M6389 Disorders of muscle in diseases classified elsewhere, multiple sites: Secondary | ICD-10-CM | POA: Diagnosis not present

## 2023-04-06 DIAGNOSIS — R4189 Other symptoms and signs involving cognitive functions and awareness: Secondary | ICD-10-CM | POA: Diagnosis not present

## 2023-04-06 DIAGNOSIS — S32810D Multiple fractures of pelvis with stable disruption of pelvic ring, subsequent encounter for fracture with routine healing: Secondary | ICD-10-CM | POA: Diagnosis not present

## 2023-04-06 DIAGNOSIS — S32481D Displaced dome fracture of right acetabulum, subsequent encounter for fracture with routine healing: Secondary | ICD-10-CM | POA: Diagnosis not present

## 2023-04-06 DIAGNOSIS — S32411D Displaced fracture of anterior wall of right acetabulum, subsequent encounter for fracture with routine healing: Secondary | ICD-10-CM | POA: Diagnosis not present

## 2023-04-06 DIAGNOSIS — R21 Rash and other nonspecific skin eruption: Secondary | ICD-10-CM

## 2023-04-06 DIAGNOSIS — F4321 Adjustment disorder with depressed mood: Secondary | ICD-10-CM | POA: Diagnosis not present

## 2023-04-06 DIAGNOSIS — M79651 Pain in right thigh: Secondary | ICD-10-CM | POA: Diagnosis not present

## 2023-04-06 NOTE — Progress Notes (Signed)
[facility] Room Number: 157-A Place of Service:  SNF [phone]) Provider:  Hazle Nordmann, NP  PCP: [name], MD  Patient Care Team: [name], MD as PCP - General (Internal Medicine) Quintella Reichert, MD as PCP - Cardiology (Cardiology) Hillis Range, MD (Inactive) as PCP - Electrophysiology (Cardiology) Hilarie Fredrickson, MD (Gastroenterology) Hillis Range, MD (Inactive) (Cardiology) Donnetta Hail, MD as Consulting Physician (Rheumatology) Berneice Heinrich Delbert Phenix., MD as Consulting Physician (Urology) Antony Contras, MD as Consulting Physician (Ophthalmology) Dannielle Burn (Dentistry) Eileen Stanford, MD as Referring Physician (Allergy and Immunology) Van Clines, MD as Consulting Physician (Neurology) Kathyrn Sheriff, [facility] as Pharmacist (Pharmacist) Van Clines, MD as Consulting Physician (Neurology)  Extended Emergency Contact Information Primary Emergency Contact: Obyrne,[name] Address: [address]          [place], [place] Home Phone: [phone] Work Phone: [phone] Mobile Phone: [phone] Relation: Spouse Secondary Emergency Contact: [name],[name] Mobile Phone: [phone] Relation: Daughter  Code Status:  DNR Goals of care: Advanced Directive information    04/06/2023    2:18 PM  Advanced Directives  Does Patient Have a Medical Advance Directive? Yes  Type of Estate agent of [place];Living will;Out of facility DNR (pink MOST or yellow form)  Does patient want to make changes to medical advance directive? No - Patient declined  Copy of Healthcare Power of Attorney in Chart? Yes - validated most recent copy scanned in chart (See row information)     Chief Complaint  Patient presents with   Acute Visit    Rash     HPI:  Pt is a 89 y.o. male seen today for an acute visit due to ongoing rash.   Ongoing rash to trunk, back and legs since  02/25/2023. Initially thought to be drug reaction to Namenda. Namenda was discontinued and prednisone taper was started. Rash subsided while on prednisone. During this time, he was  started on Depakote and Seroquel due to agitation. It was thought he had another drug reaction and Depakote and medication was eventually discontinued.   Today, nursing reports increased erythematous rash to right upper chest, back and legs. Poor historian due to dementia. He reports increased itching. He was started on hydroxyzine the day before and thinks itching has improved. No recent changes to soaps, detergents, foods or medications. Afebrile. Vitals stable.    05/14 ORIF right hip due to mechanical fall. He denies pain. F/u with Dr. Carola Frost today> TDWB with ambulation, WBAT with transfers. Remains on Eliquis for DVT prophylaxis. Percocet prn for hip pain.   Past Medical History:  Diagnosis Date   CAD (coronary artery disease)    a. s/p inferior STEMI 01/08/2014; LHC 01/08/14: total RCA occlusion s/p 3.5x55mm Xience DES distal RCA and 3.5x28 mm DES mid RCA, 60-70% mid LAD stenosis, EF 55%.   Complication of anesthesia    'long to wake up after back surgery " 09/2003   Diverticulosis of colon    GERD (gastroesophageal reflux disease)    History of cellulitis    05-26-2015  LLE   History of colon polyps    1998- benign/  2008 adenomatous    History of kidney stones    2013   History of squamous cell carcinoma excision    2013;  2015;  06-12-2015 right leg/  02/ and 05/ 2017  left ear and left leg   Hydronephrosis, left    Hyperlipidemia  Hypertension    Migraine with aura    OSA on CPAP 06/19/2015   Moderate OSA with AHI 18/hr  per study 05-20-2015   Osteoarthritis    Paroxysmal atrial fibrillation (HCC) 4/16   chads2vasc score is at least 4   Premature atrial contractions    RA (rheumatoid arthritis) [facility])    rheumatologist-  dr Alben Deeds   Sinusitis, chronic 10/17/2015   Wears glasses    Wears  hearing aid    bilateral   Past Surgical History:  Procedure Laterality Date   BACK SURGERY     CARDIOVASCULAR STRESS TEST  11/21/2015   Low risk nuclear study w/ a small diaphragmatic attenuation artifact, no ischemia/  normal LV function and wall motion , ef 63%   CATARACT EXTRACTION W/ INTRAOCULAR LENS  IMPLANT, BILATERAL  2015   COLONOSCOPY  last one 06-09-2011   CORONARY ANGIOPLASTY     CYSTOSCOPY W/ RETROGRADES Left 07/12/2016   Procedure: CYSTOSCOPY WITH RETROGRADE PYELOGRAM LEFT URETERAL STENT;  Surgeon: Bjorn Pippin, MD;  Location: [facility];  Service: Urology;  Laterality: Left;   CYSTOSCOPY WITH RETROGRADE PYELOGRAM, URETEROSCOPY AND STENT PLACEMENT Left 08/11/2016   Procedure: CYSTOSCOPY WITH RETROGRADE PYELOGRAM,  DIAGNOSTIC URETEROSCOPY , STENT EXCHANGE;  Surgeon: Sebastian Ache, MD;  Location: [facility];  Service: Urology;  Laterality: Left;   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   severe fibromatosis palm and fingers   LEFT HEART CATHETERIZATION WITH CORONARY ANGIOGRAM N/A 01/08/2014   Procedure: LEFT HEART CATHETERIZATION WITH CORONARY ANGIOGRAM;  Surgeon: Lennette Bihari, MD;  Location: [facility] CATH LAB;  Service: Cardiovascular;  Laterality:N/A;  total/ subtotal RCA/  mLAD 60-70% w/ mid systolic bridging/  preserved global LVF, ef 55%   MOHS SURGERY  x2  feb and may 2017   left ear /  left leg  (SCC)   ORIF ACETABULAR FRACTURE Right 03/29/2023   Procedure: OPEN REDUCTION INTERNAL FIXATION (ORIF) ACETABULAR FRACTURE;  Surgeon: Myrene Galas, MD;  Location: [facility] OR;  Service: Orthopedics;  Laterality: Right;   PERCUTANEOUS CORONARY STENT INTERVENTION (PCI-S)  01/08/2014   Procedure: PERCUTANEOUS CORONARY STENT INTERVENTION (PCI-S);  Surgeon: Lennette Bihari, MD;  Location: [facility] CATH LAB;  Service: Cardiovascular;;  DES to mid and distal RCA   POSTERIOR LUMBAR FUSION  10/01/2003   and Laminectomy/ diskectomy  L4 -- S1   ROBOT ASSISTED PYELOPLASTY Left 09/15/2016    Procedure: XI ROBOTIC ASSISTED PYELOPLASTY;  Surgeon: Sebastian Ache, MD;  Location: [facility];  Service: Urology;  Laterality: Left;   TONSILLECTOMY     TRANSTHORACIC ECHOCARDIOGRAM  02/23/2016   mild LVH,  ef 50-55%,  grade 1 diastolic dysfunction/  mild to moderate AV calcification w/ no stenosis or regurg./  trivial MR and TR/  mild PR    Allergies  Allergen Reactions   Methotrexate Other (See Comments)    REACTION: tachycardia   Aricept [Donepezil Hcl] Other (See Comments)    Nightmares. Pt takes this medication per [facility]    Crestor [Rosuvastatin Calcium] Other (See Comments)    Myalgias with 20mg  dose   Lisinopril Other (See Comments)    cough   Namenda [Memantine] Diarrhea and Nausea Only   Atorvastatin Other (See Comments)    Muscle pain, leg cramps   Depakote [Divalproex Sodium] Rash   Pravastatin Sodium Other (See Comments)    REACTION: cramps, fatigue    Allergies as of 04/06/2023       Reactions   Methotrexate Other (See Comments)   REACTION: tachycardia  Aricept [donepezil Hcl] Other (See Comments)   Nightmares. Pt takes this medication per [facility]    Crestor [rosuvastatin Calcium] Other (See Comments)   Myalgias with 20mg  dose   Lisinopril Other (See Comments)   cough   Namenda [memantine] Diarrhea, Nausea Only   Atorvastatin Other (See Comments)   Muscle pain, leg cramps   Depakote [divalproex Sodium] Rash   Pravastatin Sodium Other (See Comments)   REACTION: cramps, fatigue        Medication List        Accurate as of Apr 06, 2023  2:19 PM. If you have any questions, ask your nurse or doctor.          STOP taking these medications    lidocaine 4 % Stopped by: Octavia Heir, NP   Milk of Magnesia 400 MG/5ML suspension Generic drug: magnesium hydroxide Stopped by: Octavia Heir, NP       TAKE these medications    acetaminophen 325 MG tablet Commonly known as: Tylenol Take 2 tablets (650 mg total) by mouth in the morning and at bedtime.    apixaban 5 MG Tabs tablet Commonly known as: Eliquis Take 1 tablet (5 mg total) by mouth 2 (two) times daily.   docusate sodium 100 MG capsule Commonly known as: COLACE Take 1 capsule (100 mg total) by mouth 2 (two) times daily.   donepezil 5 MG tablet Commonly known as: ARICEPT TAKE ONE TABLET BY MOUTH EVERY MORNING   escitalopram 20 MG tablet Commonly known as: LEXAPRO Take 20 mg by mouth daily.   ezetimibe 10 MG tablet Commonly known as: ZETIA Take 5 mg by mouth daily.   feeding supplement Liqd Take 237 mLs by mouth 3 (three) times daily between meals.   gabapentin 300 MG capsule Commonly known as: NEURONTIN Take 1 capsule (300 mg total) by mouth 3 (three) times daily.   hydrOXYzine 25 MG tablet Commonly known as: ATARAX Take 25 mg by mouth as needed.   LORazepam 0.5 MG tablet Commonly known as: ATIVAN Take 0.5 mg by mouth 2 (two) times daily as needed.   melatonin 5 MG Tabs Take 5 mg by mouth at bedtime.   methocarbamol 500 MG tablet Commonly known as: ROBAXIN Take 1 tablet (500 mg total) by mouth every 6 (six) hours as needed for muscle spasms.   metoprolol tartrate 25 MG tablet Commonly known as: LOPRESSOR Take 25 mg by mouth at bedtime.   multivitamin with minerals Tabs tablet Take 1 tablet by mouth daily.   nitroGLYCERIN 0.4 MG SL tablet Commonly known as: NITROSTAT Place 0.4 mg under the tongue every 5 (five) minutes as needed for chest pain.   omeprazole 40 MG capsule Commonly known as: PRILOSEC Take 40 mg by mouth in the morning. Take on empty stomach 30 minutes before breakfast.   oxyCODONE-acetaminophen 5-325 MG tablet Commonly known as: Percocet Take 1 tablet by mouth every 6 (six) hours as needed for severe pain or moderate pain.   QUEtiapine 25 MG tablet Commonly known as: SEROQUEL Take 1 tablet (25 mg total) by mouth 2 (two) times daily.   Repatha SureClick 140 MG/ML Soaj Generic drug: Evolocumab INJECT 1 PEN INTO SKIN EVERY 14  DAYS   rosuvastatin 5 MG tablet Commonly known as: CRESTOR Take 5 mg by mouth every morning.        Review of Systems  Unable to perform ROS: Dementia    Immunization History  Administered Date(s) Administered   Fluad Quad(high Dose 65+) 07/09/2019,  08/15/2020, 09/16/2021, 07/29/2022   Influenza Split 08/10/2011, 08/15/2012   Influenza Whole 08/21/2009, 09/22/2010   Influenza, High Dose Seasonal PF 09/16/2015, 08/18/2016, 08/31/2017, 08/14/2018   Influenza,inj,Quad PF,6+ Mos 08/06/2013   Influenza-Unspecified 08/31/2017   PFIZER(Purple Top)SARS-COV-2 Vaccination 12/11/2019, 01/01/2020, 08/12/2020   Pneumococcal Conjugate-13 10/02/2013   Pneumococcal Polysaccharide-23 06/04/2010   Tdap 10/22/2011, 09/22/2022   Zoster Recombinat (Shingrix) 03/09/2017, 05/10/2017   Pertinent  Health Maintenance Due  Topic Date Due   INFLUENZA VACCINE  06/16/2023      10/05/2022    3:40 PM 10/12/2022    9:31 AM 11/01/2022    2:52 PM 11/02/2022    9:04 AM 11/30/2022    9:57 AM  Fall Risk  Falls in the past year? 1 1 1 1 1   Was there an injury with Fall? 1 1 1 1 1   Fall Risk Category Calculator 3 3 3 3 2   Fall Risk Category (Retired) [employer]   (RETIRED) Patient Fall Risk Level High fall risk High fall risk High fall risk High fall risk   Patient at Risk for Falls Due to History of fall(s);Impaired balance/gait History of fall(s);Impaired balance/gait;Impaired mobility  History of fall(s);Impaired balance/gait;Impaired mobility No Fall Risks  Fall risk Follow up Falls evaluation completed Falls evaluation completed Falls evaluation completed Falls evaluation completed Falls evaluation completed   Functional Status Survey:    Vitals:   04/06/23 1409  BP: (!) 147/67  Pulse: 67  Resp: 18  Temp: (!) 97.2 F (36.2 C)  SpO2: 94%  Weight: 193 lb (87.5 kg)  Height: 6' (1.829 m)   Body mass index is 26.18 kg/m. Physical Exam Vitals reviewed.  Constitutional:      General:  He is not in acute distress. HENT:     Head: Normocephalic.  Eyes:     General:        Right eye: No discharge.        Left eye: No discharge.  Cardiovascular:     Rate and Rhythm: Normal rate and regular rhythm.     Pulses: Normal pulses.     Heart sounds: Normal heart sounds.  Pulmonary:     Effort: Pulmonary effort is normal. No respiratory distress.     Breath sounds: Normal breath sounds. No wheezing.  Abdominal:     General: Bowel sounds are normal.     Palpations: Abdomen is soft.  Musculoskeletal:     Cervical back: Neck supple.     Right lower leg: No edema.     Left lower leg: No edema.  Skin:    Capillary Refill: Capillary refill takes less than 2 seconds.     Comments: Surgical dressing CDI. Erythematous rash to right upper chest, back and lower legs, scratch marks to right upper chest.   Neurological:     General: No focal deficit present.     Mental Status: He is alert. Mental status is at baseline.     Motor: Weakness present.     Gait: Gait abnormal.  Psychiatric:        Mood and Affect: Mood normal.     Labs reviewed: Recent Labs    03/29/23 0942 03/29/23 1340 03/30/23 0633 03/31/23 0219 04/05/23 0000  NA 137 137 137 135 133*  K 3.6 6.0* 4.0 3.5 4.0  CL 104 103 104 102 99  CO2 25  --  25 23 23*  GLUCOSE 112* 137* 148* 112*  --   BUN 19 29* 16 20 19   CREATININE 0.91  0.80 0.88 0.77 0.8  CALCIUM 8.7*  --  8.2* 8.3* 9.0  MG 2.1  --  2.0 2.1  --    Recent Labs    10/02/22 0350 10/11/22 0600 12/26/22 1414 01/26/23 0000 03/28/23 0815  AST 24   < > 30 19 17   ALT 16   < > 12 12 16   ALKPHOS 61   < > 85 79 56  BILITOT 0.6  --  1.0  --  0.6  PROT 7.0  --  7.8  --  6.8  ALBUMIN 4.0   < > 3.8 3.8 3.6   < > = values in this interval not displayed.   Recent Labs    07/29/22 1345 09/24/22 0000 12/26/22 1414 03/28/23 0815 03/29/23 0942 03/29/23 1340 03/30/23 0633 03/31/23 0219 04/05/23 0000  WBC 5.3   < > 8.1   < > 6.9  --  7.5 7.5 7.8   NEUTROABS 2.7  --  6.2  --   --   --   --   --   --   HGB 12.8*   < > 12.7*   < > 11.2*   < > 9.4* 9.4* 10.1*  HCT 38.2*   < > 38.3*   < > 34.6*   < > 28.1* 27.9* 30*  MCV 99.3   < > 97.0   < > 95.1  --  94.9 93.6  --   PLT 274.0   < > 272   < > 264  --  197 221 434*   < > = values in this interval not displayed.   Lab Results  Component Value Date   TSH 0.79 01/26/2023   Lab Results  Component Value Date   HGBA1C 6.4 08/30/2017   Lab Results  Component Value Date   CHOL 126 01/26/2023   HDL 36 01/26/2023   LDLCALC 39 01/26/2023   LDLDIRECT 168.9 05/24/2013   TRIG 254 (A) 01/26/2023   CHOLHDL 4.2 08/04/2020    Significant Diagnostic Results in last 30 days:  DG Pelvis Comp Min 3V  Result Date: 03/29/2023 CLINICAL DATA:  Fracture, ORIF EXAM: JUDET PELVIS - 3+ VIEW COMPARISON:  03/28/2023 FINDINGS: Plate and screw fixation device is noted in the right hemipelvis across the acetabular fractures. Anatomic alignment. No subluxation or dislocation. IMPRESSION: Internal fixation right acetabular fractures. No complicating feature. Electronically Signed   By: Charlett Nose M.D.   On: 03/29/2023 20:46   DG Pelvis Comp Min 3V  Result Date: 03/29/2023 CLINICAL DATA:  ORIF of the right acetabulum. Portable operative imaging. EXAM: OPERATIVE RIGHT HIP 6 VIEWS TECHNIQUE: Fluoroscopic spot image(s) were submitted for interpretation post-operatively. COMPARISON:  CT, 03/28/2023. FINDINGS: Portable operative imaging demonstrates placement of this a shin plates and screws along the right acetabulum reducing the comminuted acetabular fracture noted on the previous day's CT. Orthopedic hardware appears well seated. IMPRESSION: 1. Operative imaging provided for right acetabular fracture ORIF. Electronically Signed   By: Amie Portland M.D.   On: 03/29/2023 15:58   DG C-Arm 1-60 Min-No Report  Result Date: 03/29/2023 Fluoroscopy was utilized by the requesting physician.  No radiographic interpretation.    DG C-Arm 1-60 Min-No Report  Result Date: 03/29/2023 Fluoroscopy was utilized by the requesting physician.  No radiographic interpretation.   CT PELVIS WO CONTRAST  Result Date: 03/28/2023 CLINICAL DATA:  Hip fracture. EXAM: CT PELVIS WITHOUT CONTRAST TECHNIQUE: Multidetector CT imaging of the pelvis was performed following the standard protocol without intravenous contrast. RADIATION DOSE  REDUCTION: This exam was performed according to the departmental dose-optimization program which includes automated exposure control, adjustment of the mA and/or kV according to patient size and/or use of iterative reconstruction technique. COMPARISON:  X-ray earlier 03/28/2023.  CT scan 10/02/2022 FINDINGS: Urinary Tract: Enlarged prostate with mass effect along the base of the bladder. Bladder wall is slightly thickened and trabeculated. Bowel: Few sigmoid colon diverticula identified. Scattered colonic stool. The visualized bowel in the pelvis is nondilated. Normal appendix. Vascular/Lymphatic: Diffuse vascular calcifications identified along the iliac vessels and distal aorta. No discrete abnormal lymph node enlargement identified in the visualized pelvis. Reproductive: Enlarged prostate with mass effect along the base of the bladder. Other: Extraperitoneal hematoma identified along the right hemipelvis. There is also nonspecific stranding in the presacral space Musculoskeletal: Comminuted fracture involving the right acetabulum superior and medial displacement. Anterior and posterior columns of the acetabulum are involved with the fracture as well. Additional area of injury involves the inferior pubic ramus. This is in 2 areas, midportion and towards the pubic symphysis. No additional areas of fracture or dislocation. Mild degenerative changes seen of the sacroiliac joints, left hip joint. Mild hypertrophic changes of the pubic symphysis. Degenerative changes of the visualized lumbar spine with surgical material  along the disc spaces at L4-5 and L5-S1. IMPRESSION: Comminuted displaced fracture of the right acetabulum. This involves all portions of the acetabular including medial as well as the anterior and posterior columns. Separate fracture of the inferior pubic ramus in 2 areas with 1 extending towards the symphysis. Extraperitoneal hematoma along the right hemipelvis. Electronically Signed   By: Karen Kays M.D.   On: 03/28/2023 09:58   DG Chest 1 View  Result Date: 03/28/2023 CLINICAL DATA:  Right hip fracture EXAM: CHEST  1 VIEW COMPARISON:  Previous studies including the examination of 12/26/2022 FINDINGS: Transient diameter of heart is slightly increased. Thoracic aorta is ectatic. Lung fields are clear of any infiltrates or pulmonary edema. There is no pleural effusion or pneumothorax. Deformities are noted in posterior left fourth, fifth and sixth ribs suggesting old healed fractures. There is previous surgical fusion in cervical spine. IMPRESSION: There are no signs of pulmonary edema or focal pulmonary consolidation. Electronically Signed   By: Ernie Avena M.D.   On: 03/28/2023 08:55   DG Hip Unilat  With Pelvis 2-3 Views Right  Result Date: 03/28/2023 CLINICAL DATA:  Trauma, fall.  Painful to bear weight. EXAM: DG HIP (WITH OR WITHOUT PELVIS) 2-3V RIGHT COMPARISON:  Radiographs dated October 02, 2022 FINDINGS: There is cortical irregularity of the acetabular floor and right pubic rami consistent with pelvic fracture. Mild bilateral hip osteoarthritis. Sacroiliac joints and pubic symphysis appear intact. IMPRESSION: Cortical irregularity of the acetabular floor and right pubic rami consistent with pelvic fracture. CT examination for further evaluation is suggested. Electronically Signed   By: Larose [name] D.O.   On: 03/28/2023 08:48   CT HEAD WO CONTRAST  Result Date: 03/28/2023 CLINICAL DATA:  Trauma EXAM: CT HEAD WITHOUT CONTRAST TECHNIQUE: Contiguous axial images were obtained from the  base of the skull through the vertex without intravenous contrast. RADIATION DOSE REDUCTION: This exam was performed according to the departmental dose-optimization program which includes automated exposure control, adjustment of the mA and/or kV according to patient size and/or use of iterative reconstruction technique. COMPARISON:  10/02/2022 FINDINGS: Brain: No acute intracranial findings are seen. There are no signs of bleeding within the cranium. There is interval resolution of small left subdural hematoma. There is prominence  of the third and both lateral ventricles. Cortical sulci are prominent. There is decreased density in periventricular and subcortical white matter. Possible small old lacunar infarcts are seen in basal ganglia. There is low-density in the anterior aspect of pons which may be an artifact or old lacunar infarct. Vascular: Scattered arterial calcifications are seen. Skull: No fracture is seen in calvarium. Sinuses/Orbits: There is mucosal thickening in ethmoid, sphenoid and maxillary sinuses. Other: There is increased amount of CSF in the sella suggesting partial empty sella. IMPRESSION: No acute intracranial findings are seen. There are no signs of bleeding within the cranium. There is no focal mass effect. Atrophy. Small vessel disease. Possible small old lacunar infarct in pons and basal ganglia. Chronic sinusitis. Electronically Signed   By: Ernie Avena M.D.   On: 03/28/2023 08:20    Assessment/Plan 1. Rash - ongoing, thought to be related to drug reaction to Namenda and Depakote - Percocet new medication> started 05/14 after surgery - rash to right upper chest, back and legs - cont hydroxyzine BID prn - start prednisone 20 mg po QAM x 7 days - start sarnac due to h/o eczema - needs dermatology consult  2. Closed displaced fracture of right acetabulum with nonunion, unspecified portion of acetabulum, subsequent encounter - 05/14 ORIF right hip due to mechanical  fall - 05/22 f/u with Dr. Carola Frost now TDBW when ambulating, WBAT with transfers - cont percocet for pain - on Eliquis for dvt prophylaxis   Family/ staff Communication: plan discussed with patient and nurse  Labs/tests ordered:  none

## 2023-04-07 ENCOUNTER — Non-Acute Institutional Stay (SKILLED_NURSING_FACILITY): Payer: Medicare Other | Admitting: Internal Medicine

## 2023-04-07 ENCOUNTER — Encounter: Payer: Self-pay | Admitting: Internal Medicine

## 2023-04-07 DIAGNOSIS — S32401K Unspecified fracture of right acetabulum, subsequent encounter for fracture with nonunion: Secondary | ICD-10-CM

## 2023-04-07 DIAGNOSIS — I10 Essential (primary) hypertension: Secondary | ICD-10-CM

## 2023-04-07 DIAGNOSIS — G308 Other Alzheimer's disease: Secondary | ICD-10-CM

## 2023-04-07 DIAGNOSIS — M79651 Pain in right thigh: Secondary | ICD-10-CM | POA: Diagnosis not present

## 2023-04-07 DIAGNOSIS — F02C0 Dementia in other diseases classified elsewhere, severe, without behavioral disturbance, psychotic disturbance, mood disturbance, and anxiety: Secondary | ICD-10-CM

## 2023-04-07 DIAGNOSIS — S32411D Displaced fracture of anterior wall of right acetabulum, subsequent encounter for fracture with routine healing: Secondary | ICD-10-CM | POA: Diagnosis not present

## 2023-04-07 DIAGNOSIS — R4189 Other symptoms and signs involving cognitive functions and awareness: Secondary | ICD-10-CM | POA: Diagnosis not present

## 2023-04-07 DIAGNOSIS — S32591D Other specified fracture of right pubis, subsequent encounter for fracture with routine healing: Secondary | ICD-10-CM | POA: Diagnosis not present

## 2023-04-07 DIAGNOSIS — I2510 Atherosclerotic heart disease of native coronary artery without angina pectoris: Secondary | ICD-10-CM

## 2023-04-07 DIAGNOSIS — F4321 Adjustment disorder with depressed mood: Secondary | ICD-10-CM | POA: Diagnosis not present

## 2023-04-07 DIAGNOSIS — M6389 Disorders of muscle in diseases classified elsewhere, multiple sites: Secondary | ICD-10-CM | POA: Diagnosis not present

## 2023-04-07 MED ORDER — PREDNISONE 20 MG PO TABS
20.0000 mg | ORAL_TABLET | Freq: Every day | ORAL | 0 refills | Status: AC
Start: 2023-04-06 — End: 2023-04-13

## 2023-04-07 NOTE — Assessment & Plan Note (Addendum)
Serially blood pressures have ranged from a low of 127/73 up to current systolic blood pressure 158.  He has had at least 3 systolic blood pressure measurements above 150.  Metoprolol will be increased to 25 mg twice daily.

## 2023-04-07 NOTE — Progress Notes (Signed)
NURSING HOME LOCATION:  [facility] ROOM NUMBER:  [id number]  CODE STATUS:  DNR  PCP:  Einar Crow MD  This is a nursing facility follow up visit for Nursing Facility readmission within 30 days. Interim medical record and care since last SNF visit was updated with review of diagnostic studies and change in clinical status since last visit were documented.  HPI: He was hospitalized 5/13 - 04/01/2023 with pubic rami fractures and right acetabular fracture sustained in an apparent unwitnessed mechanical fall.  This is in the context of Alzheimer's dementia.  ORIF was completed 5/14 by Dr. Myrene Galas. Course was complicated by acute postop blood loss anemia for which 1 u PRBC   reported as given in the OR on 5/14.  At presentation H/H was 11.3/34.6; nadir values were 9.2/27 and final values 10.1/30.  While hospitalized he exhibited mild hyperglycemia with glucoses ranging from the low 105 up to 148.  Both of these values were outliers. Depakote, Aricept, and Seroquel were continued for his dementia without behavioral issues.  He previously been ordered Ativan as needed but this was not required during the hospitalization. He was discharged to the SNF for rehab.  Past medical history includes mixed dyslipidemia, RA, essential hypertension, CAD, PAF, OSA, and Alzheimer's dementia.  Review of systems: Dementia invalidated responses.  He has no comprehension of why he was in the hospital.  He states that he is not having significant pain in his hip.  He said that overall he is doing "fine."  He does state that "I get confused so easily."  He went on to explain that this morning he thought he was going to be traveling to [place].  He then went on to asked me how I had "known to find me here at this hotel."  Constitutional: No fever, significant weight change, fatigue  Eyes: No redness, discharge, pain, vision change ENT/mouth: No nasal congestion,  purulent discharge, earache, change  in hearing, sore throat  Cardiovascular: No chest pain, palpitations, paroxysmal nocturnal dyspnea, claudication, edema  Respiratory: No cough, sputum production, hemoptysis, DOE, significant snoring, apnea   Gastrointestinal: No heartburn, dysphagia, abdominal pain, nausea /vomiting, rectal bleeding, melena, change in bowels Genitourinary: No dysuria, hematuria, pyuria, incontinence, nocturia Dermatologic: No rash, pruritus, change in appearance of skin Neurologic: No dizziness, headache, syncope, seizures, numbness, tingling Psychiatric: No significant anxiety, depression, insomnia, anorexia Endocrine: No change in hair/skin/nails, excessive thirst, excessive hunger, excessive urination  Hematologic/lymphatic: No significant bruising, lymphadenopathy, abnormal bleeding Allergy/immunology: No itchy/watery eyes, significant sneezing, urticaria, angioedema  Physical exam:  Pertinent or positive findings: He was initially asleep without snoring, hypopnea, or frank apnea.  He was easily aroused.  Eyebrows are decreased in density.  There is slight decrease in the right nasolabial fold.  Facies tend to be blank.  Grade 1/2 systolic murmur is noted at the base.  He has decreased pedal pulses.  There is trace edema at the ankles.  General appearance: Adequately nourished; no acute distress, increased work of breathing is present.   Lymphatic: No lymphadenopathy about the head, neck, axilla. Eyes: No conjunctival inflammation or lid edema is present. There is no scleral icterus. Ears:  External ear exam shows no significant lesions or deformities.   Nose:  External nasal examination shows no deformity or inflammation. Nasal mucosa are pink and moist without lesions, exudates Oral exam:  Lips and gums are healthy appearing. There is no oropharyngeal erythema or exudate. Neck:  No thyromegaly, masses, tenderness noted.  Heart:  Normal rate and regular rhythm. S1 and S2 normal without gallop, click,  rub .  Lungs: Chest clear to auscultation without wheezes, rhonchi, rales, rubs. Abdomen: Bowel sounds are normal. Abdomen is soft and nontender with no organomegaly, hernias, masses. GU: Deferred  Extremities:  No cyanosis, clubbing  Neurologic exam :Balance, Rhomberg, finger to nose testing could not be completed due to clinical state Skin: Warm & dry w/o tenting. No significant lesions or rash.  See summary under each active problem in the Problem List with associated updated therapeutic plan

## 2023-04-07 NOTE — Assessment & Plan Note (Signed)
PT/OT at SNF as tolerated as dementia allows compliance.  Orthopedic follow-up as scheduled.

## 2023-04-07 NOTE — Assessment & Plan Note (Addendum)
He is pleasantly confused confabulating about being in a hotel and intermittently confused.  He has no conception of the events of the hospitalization.  No behavioral issues reported by staff.  Continue to hold Ativan because of the high risk of falls.

## 2023-04-07 NOTE — Assessment & Plan Note (Signed)
He denies any anginal equivalent.  Metoprolol will be increased to 25 mg twice daily because of intermittent systolic blood pressures above 150.

## 2023-04-07 NOTE — Patient Instructions (Signed)
See assessment and plan under each diagnosis in the problem list and acutely for this visit 

## 2023-04-08 DIAGNOSIS — F4321 Adjustment disorder with depressed mood: Secondary | ICD-10-CM | POA: Diagnosis not present

## 2023-04-08 DIAGNOSIS — S32591D Other specified fracture of right pubis, subsequent encounter for fracture with routine healing: Secondary | ICD-10-CM | POA: Diagnosis not present

## 2023-04-08 DIAGNOSIS — M79651 Pain in right thigh: Secondary | ICD-10-CM | POA: Diagnosis not present

## 2023-04-08 DIAGNOSIS — S32411D Displaced fracture of anterior wall of right acetabulum, subsequent encounter for fracture with routine healing: Secondary | ICD-10-CM | POA: Diagnosis not present

## 2023-04-08 DIAGNOSIS — M6389 Disorders of muscle in diseases classified elsewhere, multiple sites: Secondary | ICD-10-CM | POA: Diagnosis not present

## 2023-04-08 DIAGNOSIS — R4189 Other symptoms and signs involving cognitive functions and awareness: Secondary | ICD-10-CM | POA: Diagnosis not present

## 2023-04-11 DIAGNOSIS — S32411D Displaced fracture of anterior wall of right acetabulum, subsequent encounter for fracture with routine healing: Secondary | ICD-10-CM | POA: Diagnosis not present

## 2023-04-11 DIAGNOSIS — F4321 Adjustment disorder with depressed mood: Secondary | ICD-10-CM | POA: Diagnosis not present

## 2023-04-11 DIAGNOSIS — S32591D Other specified fracture of right pubis, subsequent encounter for fracture with routine healing: Secondary | ICD-10-CM | POA: Diagnosis not present

## 2023-04-11 DIAGNOSIS — M6389 Disorders of muscle in diseases classified elsewhere, multiple sites: Secondary | ICD-10-CM | POA: Diagnosis not present

## 2023-04-11 DIAGNOSIS — M79651 Pain in right thigh: Secondary | ICD-10-CM | POA: Diagnosis not present

## 2023-04-11 DIAGNOSIS — R4189 Other symptoms and signs involving cognitive functions and awareness: Secondary | ICD-10-CM | POA: Diagnosis not present

## 2023-04-12 ENCOUNTER — Other Ambulatory Visit: Payer: Self-pay | Admitting: Internal Medicine

## 2023-04-12 DIAGNOSIS — S32591D Other specified fracture of right pubis, subsequent encounter for fracture with routine healing: Secondary | ICD-10-CM | POA: Diagnosis not present

## 2023-04-12 DIAGNOSIS — F4321 Adjustment disorder with depressed mood: Secondary | ICD-10-CM | POA: Diagnosis not present

## 2023-04-12 DIAGNOSIS — R4189 Other symptoms and signs involving cognitive functions and awareness: Secondary | ICD-10-CM | POA: Diagnosis not present

## 2023-04-12 DIAGNOSIS — M6389 Disorders of muscle in diseases classified elsewhere, multiple sites: Secondary | ICD-10-CM | POA: Diagnosis not present

## 2023-04-12 DIAGNOSIS — S32411D Displaced fracture of anterior wall of right acetabulum, subsequent encounter for fracture with routine healing: Secondary | ICD-10-CM | POA: Diagnosis not present

## 2023-04-12 DIAGNOSIS — M79651 Pain in right thigh: Secondary | ICD-10-CM | POA: Diagnosis not present

## 2023-04-12 MED ORDER — OXYCODONE-ACETAMINOPHEN 5-325 MG PO TABS: 1.0000 | ORAL_TABLET | Freq: Four times a day (QID) | ORAL | 0 refills | Status: DC | PRN

## 2023-04-13 DIAGNOSIS — M6389 Disorders of muscle in diseases classified elsewhere, multiple sites: Secondary | ICD-10-CM | POA: Diagnosis not present

## 2023-04-13 DIAGNOSIS — M79651 Pain in right thigh: Secondary | ICD-10-CM | POA: Diagnosis not present

## 2023-04-13 DIAGNOSIS — F4321 Adjustment disorder with depressed mood: Secondary | ICD-10-CM | POA: Diagnosis not present

## 2023-04-13 DIAGNOSIS — S32411D Displaced fracture of anterior wall of right acetabulum, subsequent encounter for fracture with routine healing: Secondary | ICD-10-CM | POA: Diagnosis not present

## 2023-04-13 DIAGNOSIS — S32591D Other specified fracture of right pubis, subsequent encounter for fracture with routine healing: Secondary | ICD-10-CM | POA: Diagnosis not present

## 2023-04-13 DIAGNOSIS — R4189 Other symptoms and signs involving cognitive functions and awareness: Secondary | ICD-10-CM | POA: Diagnosis not present

## 2023-04-14 DIAGNOSIS — M79651 Pain in right thigh: Secondary | ICD-10-CM | POA: Diagnosis not present

## 2023-04-14 DIAGNOSIS — M6389 Disorders of muscle in diseases classified elsewhere, multiple sites: Secondary | ICD-10-CM | POA: Diagnosis not present

## 2023-04-14 DIAGNOSIS — S32411D Displaced fracture of anterior wall of right acetabulum, subsequent encounter for fracture with routine healing: Secondary | ICD-10-CM | POA: Diagnosis not present

## 2023-04-14 DIAGNOSIS — S32591D Other specified fracture of right pubis, subsequent encounter for fracture with routine healing: Secondary | ICD-10-CM | POA: Diagnosis not present

## 2023-04-14 DIAGNOSIS — R4189 Other symptoms and signs involving cognitive functions and awareness: Secondary | ICD-10-CM | POA: Diagnosis not present

## 2023-04-14 DIAGNOSIS — F4321 Adjustment disorder with depressed mood: Secondary | ICD-10-CM | POA: Diagnosis not present

## 2023-04-15 DIAGNOSIS — S32411D Displaced fracture of anterior wall of right acetabulum, subsequent encounter for fracture with routine healing: Secondary | ICD-10-CM | POA: Diagnosis not present

## 2023-04-15 DIAGNOSIS — R4189 Other symptoms and signs involving cognitive functions and awareness: Secondary | ICD-10-CM | POA: Diagnosis not present

## 2023-04-15 DIAGNOSIS — M79651 Pain in right thigh: Secondary | ICD-10-CM | POA: Diagnosis not present

## 2023-04-15 DIAGNOSIS — M6389 Disorders of muscle in diseases classified elsewhere, multiple sites: Secondary | ICD-10-CM | POA: Diagnosis not present

## 2023-04-15 DIAGNOSIS — S32591D Other specified fracture of right pubis, subsequent encounter for fracture with routine healing: Secondary | ICD-10-CM | POA: Diagnosis not present

## 2023-04-15 DIAGNOSIS — F4321 Adjustment disorder with depressed mood: Secondary | ICD-10-CM | POA: Diagnosis not present

## 2023-04-16 ENCOUNTER — Emergency Department (HOSPITAL_COMMUNITY): Payer: Medicare Other

## 2023-04-16 ENCOUNTER — Emergency Department (HOSPITAL_COMMUNITY)
Admission: EM | Admit: 2023-04-16 | Discharge: 2023-04-16 | Disposition: A | Discharge status: Home / Self Care | Payer: Medicare Other | Attending: Emergency Medicine | Admitting: Emergency Medicine

## 2023-04-16 ENCOUNTER — Other Ambulatory Visit: Payer: Self-pay

## 2023-04-16 ENCOUNTER — Encounter (HOSPITAL_COMMUNITY): Payer: Self-pay | Admitting: Emergency Medicine

## 2023-04-16 DIAGNOSIS — W19XXXA Unspecified fall, initial encounter: Secondary | ICD-10-CM

## 2023-04-16 DIAGNOSIS — S51011A Laceration without foreign body of right elbow, initial encounter: Secondary | ICD-10-CM | POA: Diagnosis not present

## 2023-04-16 DIAGNOSIS — R9082 White matter disease, unspecified: Secondary | ICD-10-CM | POA: Diagnosis not present

## 2023-04-16 DIAGNOSIS — S5001XA Contusion of right elbow, initial encounter: Secondary | ICD-10-CM | POA: Diagnosis not present

## 2023-04-16 DIAGNOSIS — W01198A Fall on same level from slipping, tripping and stumbling with subsequent striking against other object, initial encounter: Secondary | ICD-10-CM | POA: Insufficient documentation

## 2023-04-16 DIAGNOSIS — F0390 Unspecified dementia without behavioral disturbance: Secondary | ICD-10-CM | POA: Diagnosis not present

## 2023-04-16 DIAGNOSIS — M25572 Pain in left ankle and joints of left foot: Secondary | ICD-10-CM | POA: Diagnosis not present

## 2023-04-16 DIAGNOSIS — S01511A Laceration without foreign body of lip, initial encounter: Secondary | ICD-10-CM | POA: Insufficient documentation

## 2023-04-16 DIAGNOSIS — I2510 Atherosclerotic heart disease of native coronary artery without angina pectoris: Secondary | ICD-10-CM | POA: Insufficient documentation

## 2023-04-16 DIAGNOSIS — R58 Hemorrhage, not elsewhere classified: Secondary | ICD-10-CM | POA: Diagnosis not present

## 2023-04-16 DIAGNOSIS — M25551 Pain in right hip: Secondary | ICD-10-CM | POA: Diagnosis not present

## 2023-04-16 DIAGNOSIS — M25511 Pain in right shoulder: Secondary | ICD-10-CM | POA: Diagnosis not present

## 2023-04-16 DIAGNOSIS — Z043 Encounter for examination and observation following other accident: Secondary | ICD-10-CM | POA: Diagnosis not present

## 2023-04-16 DIAGNOSIS — I10 Essential (primary) hypertension: Secondary | ICD-10-CM | POA: Diagnosis not present

## 2023-04-16 DIAGNOSIS — S01111A Laceration without foreign body of right eyelid and periocular area, initial encounter: Secondary | ICD-10-CM | POA: Diagnosis not present

## 2023-04-16 DIAGNOSIS — Z7901 Long term (current) use of anticoagulants: Secondary | ICD-10-CM | POA: Insufficient documentation

## 2023-04-16 LAB — CBC WITH DIFFERENTIAL/PLATELET
Abs Immature Granulocytes: 0.05 10*3/uL (ref 0.00–0.07)
Basophils Absolute: 0.1 10*3/uL (ref 0.0–0.1)
Basophils Relative: 1 %
Eosinophils Absolute: 0.3 10*3/uL (ref 0.0–0.5)
Eosinophils Relative: 4 %
HCT: 34.3 % — ABNORMAL LOW (ref 39.0–52.0)
Hemoglobin: 10.9 g/dL — ABNORMAL LOW (ref 13.0–17.0)
Immature Granulocytes: 1 %
Lymphocytes Relative: 17 %
Lymphs Abs: 1.4 10*3/uL (ref 0.7–4.0)
MCH: 31.1 pg (ref 26.0–34.0)
MCHC: 31.8 g/dL (ref 30.0–36.0)
MCV: 98 fL (ref 80.0–100.0)
Monocytes Absolute: 0.6 10*3/uL (ref 0.1–1.0)
Monocytes Relative: 7 %
Neutro Abs: 5.6 10*3/uL (ref 1.7–7.7)
Neutrophils Relative %: 70 %
Platelets: 466 10*3/uL — ABNORMAL HIGH (ref 150–400)
RBC: 3.5 MIL/uL — ABNORMAL LOW (ref 4.22–5.81)
RDW: 15.6 % — ABNORMAL HIGH (ref 11.5–15.5)
WBC: 8 10*3/uL (ref 4.0–10.5)
nRBC: 0 % (ref 0.0–0.2)

## 2023-04-16 LAB — BASIC METABOLIC PANEL
Anion gap: 6 (ref 5–15)
BUN: 13 mg/dL (ref 8–23)
CO2: 27 mmol/L (ref 22–32)
Calcium: 8.7 mg/dL — ABNORMAL LOW (ref 8.9–10.3)
Chloride: 105 mmol/L (ref 98–111)
Creatinine, Ser: 0.87 mg/dL (ref 0.61–1.24)
GFR, Estimated: >60 mL/min (ref >60)
Glucose, Bld: 104 mg/dL — ABNORMAL HIGH (ref 70–99)
Potassium: 3.9 mmol/L (ref 3.5–5.1)
Sodium: 138 mmol/L (ref 135–145)

## 2023-04-16 MED ORDER — LIDOCAINE-EPINEPHRINE (PF) 2 %-1:200000 IJ SOLN
10.0000 mL | Freq: Once | INTRAMUSCULAR | Status: AC
  Administered 2023-04-16: 10 mL
  Filled 2023-04-16: qty 20

## 2023-04-16 MED ORDER — LIDOCAINE-EPINEPHRINE-TETRACAINE (LET) TOPICAL GEL
3.0000 mL | Freq: Once | TOPICAL | Status: DC
  Filled 2023-04-16: qty 3

## 2023-04-16 NOTE — Progress Notes (Signed)
   04/16/23 0954  Spiritual Encounters  Type of Visit Initial  Care provided to: [facility] partners present during encounter Nurse  Referral source Trauma page  Reason for visit Trauma  OnCall Visit Yes   Chaplain responded to a trauma in the ED - level II, fall on blood thinners. Chaplain met with patient's wife. Chaplain extended hospitality.Chaplain introduced spiritual care services. Spiritual care services available as needed.   Alda Ponder, Chaplain 04/16/23

## 2023-04-16 NOTE — ED Provider Notes (Signed)
[place] EMERGENCY DEPARTMENT AT [facility] Provider Note   CSN: [id number] Arrival date & time: 04/16/23  7829     History  Chief Complaint  Patient presents with   Mack Guise is a 89 y.o. male.  Patient is an 89 year old male with a history of dementia, paroxysmal atrial fibrillation on Eliquis, CAD, prior falls most recently at the beginning of May with a right hip fracture requiring replacement who is presenting today as a level 2 trauma after a fall at his memory care.  It was reported that patient tripped today against the doorway falling forward and hitting his forehead and lip on the floor.  He did complain of some hip pain when EMS arrived and they gave him 50 mcg of fentanyl however now patient is complaining mostly of his right elbow.  He cannot really recall why he fell but he denies loss of consciousness.  EMS also reports patient is at his baseline.  He is currently denying any pain in his hip.  The history is provided by the patient, the EMS personnel and the spouse.  Fall       Home Medications Prior to Admission medications   Medication Sig Start Date End Date Taking? Authorizing Provider  acetaminophen (TYLENOL) 325 MG tablet Take 2 tablets (650 mg total) by mouth in the morning and at bedtime. 04/04/23  Yes Fletcher Anon, NP  apixaban (ELIQUIS) 5 MG TABS tablet Take 1 tablet (5 mg total) by mouth 2 (two) times daily. 11/30/22  Yes [name], MD  camphor-menthol [facility]) lotion Apply 1 Application topically 2 (two) times daily as needed for itching.   Yes [provider]  docusate sodium (COLACE) 100 MG capsule Take 1 capsule (100 mg total) by mouth 2 (two) times daily. 03/31/23  Yes Rolly Salter, MD  donepezil (ARICEPT) 5 MG tablet TAKE ONE TABLET BY MOUTH EVERY MORNING 11/07/22  Yes Plotnikov, Georgina Quint, MD  escitalopram (LEXAPRO) 20 MG tablet Take 20 mg by mouth daily.   Yes [provider]  Evolocumab (REPATHA  SURECLICK) 140 MG/ML SOAJ INJECT 1 PEN INTO SKIN EVERY 14 DAYS Patient taking differently: Inject 140 mg into the skin every 14 (fourteen) days. 03/30/21  Yes Turner, Cornelious Bryant, MD  ezetimibe (ZETIA) 10 MG tablet Take 5 mg by mouth daily.   Yes [provider]  feeding supplement (ENSURE ENLIVE / ENSURE PLUS) LIQD Take 237 mLs by mouth 3 (three) times daily between meals. Patient taking differently: Take 237 mLs by mouth 3 (three) times daily as needed (Supplement). 03/31/23  Yes Rolly Salter, MD  gabapentin (NEURONTIN) 300 MG capsule Take 1 capsule (300 mg total) by mouth 3 (three) times daily. 03/31/23  Yes Rolly Salter, MD  haloperidol (HALDOL) 1 MG tablet Take 1 mg by mouth 2 (two) times daily. 04/14/23  Yes [provider]  hydrOXYzine (ATARAX) 25 MG tablet Take 25 mg by mouth 2 (two) times daily as needed for itching.   Yes [provider]  LORazepam (ATIVAN) 0.5 MG tablet Take 0.5 mg by mouth 2 (two) times daily as needed for anxiety.   Yes [provider]  melatonin 5 MG TABS Take 5 mg by mouth at bedtime.   Yes [provider]  methocarbamol (ROBAXIN) 500 MG tablet Take 1 tablet (500 mg total) by mouth every 6 (six) hours as needed for muscle spasms. 03/31/23  Yes Rolly Salter, MD  metoprolol tartrate (  LOPRESSOR) 25 MG tablet Take 25 mg by mouth at bedtime. Hold for SBP <100   Yes [provider]  Multiple Vitamin (MULTIVITAMIN WITH MINERALS) TABS tablet Take 1 tablet by mouth daily. 04/01/23  Yes Rolly Salter, MD  nitroGLYCERIN (NITROSTAT) 0.4 MG SL tablet Place 0.4 mg under the tongue every 5 (five) minutes as needed for chest pain.   Yes [provider]  omeprazole (PRILOSEC) 40 MG capsule Take 40 mg by mouth in the morning. Take on empty stomach 30 minutes before breakfast.   Yes [provider]  Oxcarbazepine (TRILEPTAL) 300 MG tablet Take 300 mg by mouth 2 (two) times daily. 04/15/23  Yes [provider]   oxyCODONE-acetaminophen (PERCOCET) 5-325 MG tablet Take 1 tablet by mouth every 6 (six) hours as needed for severe pain or moderate pain. 04/12/23 04/11/24 Yes [name], MD  rosuvastatin (CRESTOR) 5 MG tablet Take 5 mg by mouth every morning.   Yes [provider]  carbamazepine (TEGRETOL) 200 MG tablet Take 200 mg by mouth 2 (two) times daily. Patient not taking: Reported on 04/16/2023 04/14/23   [provider]  QUEtiapine (SEROQUEL) 25 MG tablet Take 1 tablet (25 mg total) by mouth 2 (two) times daily. Patient not taking: Reported on 04/16/2023 03/31/23   Rolly Salter, MD  apixaban (ELIQUIS) 2.5 MG TABS tablet Take 1 tablet (2.5 mg total) by mouth 2 (two) times daily. 10/18/22   Fletcher Anon, NP      Allergies    Methotrexate, Aricept Palma Holter hcl], Crestor [rosuvastatin calcium], Lisinopril, Namenda [memantine], Atorvastatin, Depakote [divalproex sodium], and Pravastatin sodium    Review of Systems   Review of Systems  Physical Exam Updated Vital Signs BP (!) 153/68   Pulse 68   Temp 98.8 F (37.1 C)   Resp 12   Ht 6' (1.829 m)   Wt 87 kg   SpO2 98%   BMI 26.01 kg/m  Physical Exam Vitals and nursing note reviewed.  Constitutional:      General: He is not in acute distress.    Appearance: He is well-developed.  HENT:     Head: Normocephalic and atraumatic.      Comments: Hematoma and 2 cm laceration to the right eyebrow.  Intraoral laceration to the right upper lip.  Dentition is intact Eyes:     Conjunctiva/sclera: Conjunctivae normal.     Pupils: Pupils are equal, round, and reactive to light.  Neck:     Comments: C-collar is in place Cardiovascular:     Rate and Rhythm: Normal rate and regular rhythm.     Heart sounds: No murmur heard. Pulmonary:     Effort: Pulmonary effort is normal. No respiratory distress.     Breath sounds: Normal breath sounds. No wheezing or rales.  Abdominal:     General: There is no distension.     Palpations:  Abdomen is soft.     Tenderness: There is no abdominal tenderness. There is no guarding or rebound.  Musculoskeletal:        General: Tenderness present. Normal range of motion.     Cervical back: No tenderness.     Comments: Pain with flexion and extension of the right elbow.  No localized pain over the olecranon or epicondyles with palpation.  No skin tears or significant swelling.  Some mild tenderness with palpation of the shoulder but full range of motion.  Bilateral wrists are normal.  The left shoulder and elbow are normal.  No  significant deformity with ranging bilateral hips but patient does have some mild stiffness when trying to move the right leg.  Pulses intact in all 4 extremities.  Skin:    General: Skin is warm and dry.     Findings: No erythema or rash.  Neurological:     Mental Status: He is alert. Mental status is at baseline.  Psychiatric:        Mood and Affect: Mood normal.        Behavior: Behavior normal.     ED Results / Procedures / Treatments   Labs (all labs ordered are listed, but only abnormal results are displayed) Labs Reviewed  CBC WITH DIFFERENTIAL/PLATELET - Abnormal; Notable for the following components:      Result Value   RBC 3.50 (*)    Hemoglobin 10.9 (*)    HCT 34.3 (*)    RDW 15.6 (*)    Platelets 466 (*)    All other components within normal limits  BASIC METABOLIC PANEL - Abnormal; Notable for the following components:   Glucose, Bld 104 (*)    Calcium 8.7 (*)    All other components within normal limits    EKG EKG Interpretation  Date/Time:  Saturday April 16 2023 10:33:31 EDT Ventricular Rate:  63 PR Interval:  176 QRS Duration: 107 QT Interval:  440 QTC Calculation: 451 R Axis:   -6 Text Interpretation: Sinus rhythm Abnormal R-wave progression, early transition Inferior infarct, old No significant change since last tracing Confirmed by Gwyneth Sprout ([id number]) on 04/16/2023 11:42:23 AM  Radiology DG Elbow Complete  Right  Result Date: 04/16/2023 CLINICAL DATA:  89 year old male with history of trauma from a fall complaining of right elbow obtained. EXAM: RIGHT ELBOW - COMPLETE 3+ VIEW COMPARISON:  No priors. FINDINGS: There is no evidence of fracture, dislocation, or joint effusion. There is no evidence of arthropathy or other focal bone abnormality. Soft tissues are unremarkable. IMPRESSION: Negative. Electronically Signed   By: Trudie Reed M.D.   On: 04/16/2023 10:15   DG Chest Port 1 View  Result Date: 04/16/2023 CLINICAL DATA:  89 year old male with history of trauma from a fall. EXAM: PORTABLE CHEST 1 VIEW COMPARISON:  Chest x-ray 03/28/2023. FINDINGS: Lung volumes are normal. No consolidative airspace disease. No pleural effusions. No pneumothorax. No pulmonary nodule or mass noted. Pulmonary vasculature and the cardiomediastinal silhouette are within normal limits. Atherosclerosis in the thoracic aorta. Multiple old healed posterior left-sided rib fractures are again noted. Orthopedic fixation hardware in the lower cervical spine incidentally noted. IMPRESSION: 1.  No radiographic evidence of acute cardiopulmonary disease. 2. Aortic atherosclerosis. Electronically Signed   By: Trudie Reed M.D.   On: 04/16/2023 10:15   DG Pelvis Portable  Result Date: 04/16/2023 CLINICAL DATA:  89 year old male with history of trauma from a fall. EXAM: PORTABLE PELVIS 1-2 VIEWS COMPARISON:  03/29/2023. FINDINGS: Plate and screw fixation device in the right hemipelvis traversing an old healed right acetabular fracture, similar to the prior study. No definite acute displaced fracture of the bony pelvic ring or either proximal femur as visualized. Bilateral femoral heads project over the acetabuli on this single view examination. Degenerative changes of moderate osteoarthritis are noted in both hip joints. Atherosclerotic calcifications. IMPRESSION: 1. No acute radiographic abnormality of the bony pelvis on this single view  examination. Status post ORIF in the right hemipelvis for old right acetabular fracture. 2. Atherosclerosis. Electronically Signed   By: Trudie Reed M.D.   On: 04/16/2023 10:14  Procedures Procedures    Medications Ordered in ED Medications  lidocaine-EPINEPHrine (XYLOCAINE W/EPI) 2 %-1:200000 (PF) injection 10 mL (10 mLs Infiltration Given by Other 04/16/23 1154)    ED Course/ Medical Decision Making/ A&P                             Medical Decision Making Amount and/or Complexity of Data Reviewed Labs: ordered. Decision-making details documented in ED Course. Radiology: ordered and independent interpretation performed. Decision-making details documented in ED Course. ECG/medicine tests: ordered and independent interpretation performed. Decision-making details documented in ED Course.   Pt with multiple medical problems and comorbidities and presenting today with a complaint that caries a high risk for morbidity and mortality.  Elderly male presenting today with a level 2 trauma after a fall on Eliquis.  It was reported that patient tripped and fell forward.  Recent fall resulting in a hip fracture several weeks ago.  Patient is complaining of elbow pain but did complain of right hip pain which was replaced to EMS.  Patient does have signs of trauma to the face and lip but no dental injuries.  He is awake and alert at this time.  Vital signs reassuring.  Imaging of the hip, right shoulder and elbow as well as head and neck are pending.  Wound repaired per PA note.  Tetanus shot is up-to-date in 2023.   1:03 PM I independently interpreted patient's labs and EKG.  CBC, CMP and EKG all without acute findings.  I have independently visualized and interpreted pt's images today.  CT of the head and neck today without evidence of acute fracture or intracranial bleed.  Radiology reports chronic findings but no acute issues.  X-ray of the right shoulder, elbow and pelvis all without evidence of  acute fracture.  Chest x-ray also within normal limits.  On repeat evaluation patient is still doing well denies any pain at this time.  Will dress patient's wounds, confirm that patient is able to ambulate and if so he is stable for discharge.  Wife is present in the room and feels comfortable with this plan.  She is able to take him back to [facility].  He will need suture removal in 5-7days.  1:03 PM Pt was able to take a few steps to the wheelchair but wife reports he has been in a wheelchair since his hip fracture and has not been walking much.  He lives at [facility] and she feels comfortable taking him home.         Final Clinical Impression(s) / ED Diagnoses Final diagnoses:  Fall, initial encounter  Eyebrow laceration, right, initial encounter  Lip laceration, initial encounter  Contusion of right elbow, initial encounter    Rx / DC Orders ED Discharge Orders     None         Gwyneth Sprout, MD 04/16/23 1304

## 2023-04-16 NOTE — ED Provider Notes (Signed)
..  Laceration Repair  Date/Time: 04/16/2023 11:51 AM  Performed by: Netta Corrigan, PA-C Authorized by: Netta Corrigan, PA-C   Consent:    Consent obtained:  Verbal   Consent given by:  Patient   Risks, benefits, and alternatives were discussed: yes     Risks discussed:  Infection, need for additional repair, nerve damage and pain   Alternatives discussed:  No treatment Universal protocol:    Patient identity confirmed:  Verbally with patient Anesthesia:    Anesthesia method:  Nerve block   Block needle gauge:  25 G   Block anesthetic:  Lidocaine 2% WITH epi   Block technique:  Supraorbital right side   Block outcome:  Anesthesia achieved Laceration details:    Location:  Face   Face location:  R eyebrow   Length (cm):  3   Depth (mm):  3 Treatment:    Area cleansed with:  Saline   Amount of cleaning:  Standard   Irrigation solution:  Sterile water   Irrigation volume:  100 mL   Irrigation method:  Pressure wash   Visualized foreign bodies/material removed: no     Debridement:  None Skin repair:    Repair method:  Sutures   Suture size:  5-0   Suture material:  Prolene   Suture technique:  Simple interrupted   Number of sutures:  6 Approximation:    Approximation:  Close Repair type:    Repair type:  Simple Post-procedure details:    Dressing:  Open (no dressing)   Procedure completion:  Tami Lin, PA-C 04/16/23 1152    Gwyneth Sprout, MD 04/16/23 2052

## 2023-04-16 NOTE — Discharge Instructions (Addendum)
Stitches will need to come out in 5 to 7 days.  All images including a scan of the head and neck were okay.  However if he starts having recurrent vomiting, worsening confusion or change in mental status return to the emergency room for repeat evaluation.  Labs and x-rays of the right shoulder, elbow and his hip are all normal.  Use Tylenol 2 tablets as needed for pain every 6 hours.

## 2023-04-16 NOTE — Progress Notes (Signed)
Orthopedic Tech Progress Note Patient Details:  [name] [facility] Jan 21, 1933 [id number] Level 2 Trauma  Patient ID: [name], male   DOB: 10-23-1933, 89 y.o.   MRN: [id number]  [name] 04/16/2023, 9:41 AM

## 2023-04-16 NOTE — ED Notes (Signed)
Trauma Response Nurse Documentation   [name] is a 89 y.o. male arriving to [facility] ED via EMS  On Eliquis (apixaban) daily. Trauma was activated as a Level 2 by ED Charge RN based on the following trauma criteria Elderly patients > 65 with head trauma on anti-coagulation (excluding ASA).  Patient cleared for CT by Dr. Anitra Lauth. Pt transported to CT with primary nurse present to monitor. RN remained with the patient throughout their absence from the department for clinical observation.   GCS 14 (baseline).  History   Past Medical History:  Diagnosis Date   CAD (coronary artery disease)    a. s/p inferior STEMI 01/08/2014; LHC 01/08/14: total RCA occlusion s/p 3.5x29mm Xience DES distal RCA and 3.5x28 mm DES mid RCA, 60-70% mid LAD stenosis, EF 55%.   Complication of anesthesia    'long to wake up after back surgery " 09/2003   Diverticulosis of colon    GERD (gastroesophageal reflux disease)    History of cellulitis    05-26-2015  LLE   History of colon polyps    1998- benign/  2008 adenomatous    History of kidney stones    2013   History of squamous cell carcinoma excision    2013;  2015;  06-12-2015 right leg/  02/ and 05/ 2017  left ear and left leg   Hydronephrosis, left    Hyperlipidemia    Hypertension    Migraine with aura    OSA on CPAP 06/19/2015   Moderate OSA with AHI 18/hr  per study 05-20-2015   Osteoarthritis    Paroxysmal atrial fibrillation (HCC) 4/16   chads2vasc score is at least 4   Premature atrial contractions    RA (rheumatoid arthritis) [facility])    rheumatologist-  dr Alben Deeds   Sinusitis, chronic 10/17/2015   Wears glasses    Wears hearing aid    bilateral     Past Surgical History:  Procedure Laterality Date   BACK SURGERY     CARDIOVASCULAR STRESS TEST  11/21/2015   Low risk nuclear study w/ a small diaphragmatic attenuation artifact, no ischemia/  normal LV function and wall motion , ef 63%   CATARACT EXTRACTION W/ INTRAOCULAR LENS   IMPLANT, BILATERAL  2015   COLONOSCOPY  last one 06-09-2011   CORONARY ANGIOPLASTY     CYSTOSCOPY W/ RETROGRADES Left 07/12/2016   Procedure: CYSTOSCOPY WITH RETROGRADE PYELOGRAM LEFT URETERAL STENT;  Surgeon: Bjorn Pippin, MD;  Location: [facility];  Service: Urology;  Laterality: Left;   CYSTOSCOPY WITH RETROGRADE PYELOGRAM, URETEROSCOPY AND STENT PLACEMENT Left 08/11/2016   Procedure: CYSTOSCOPY WITH RETROGRADE PYELOGRAM,  DIAGNOSTIC URETEROSCOPY , STENT EXCHANGE;  Surgeon: Sebastian Ache, MD;  Location: [facility];  Service: Urology;  Laterality: Left;   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   severe fibromatosis palm and fingers   LEFT HEART CATHETERIZATION WITH CORONARY ANGIOGRAM N/A 01/08/2014   Procedure: LEFT HEART CATHETERIZATION WITH CORONARY ANGIOGRAM;  Surgeon: Lennette Bihari, MD;  Location[facility] C[facility] LAB;  Service: Cardiovascular;  Laterality:N/A;  total/ subtotal RCA/  mLAD 60-70% w/ mid systolic bridging/  preserved global LVF, ef 55%   MOHS SURGERY  x2  feb and may 2017   left ear /  left leg  (SCC)   ORIF ACETABULAR FRACTURE Right 03/29/2023   Procedure: OPEN REDUCTION INTERNAL FIXATION (ORIF) ACETABULAR FRACTURE;  Surgeon: Myrene Galas, MD;  Location: [facility] OR;  Service: Orthopedics;  Laterality: Right;   PERCUTANEOUS CORONARY STENT INTERVENTION (  PCI-S)  01/08/2014   Procedure: PERCUTANEOUS CORONARY STENT INTERVENTION (PCI-S);  Surgeon: Lennette Bihari, MD;  Location: [facility] CATH LAB;  Service: Cardiovascular;;  DES to mid and distal RCA   POSTERIOR LUMBAR FUSION  10/01/2003   and Laminectomy/ diskectomy  L4 -- S1   ROBOT ASSISTED PYELOPLASTY Left 09/15/2016   Procedure: XI ROBOTIC ASSISTED PYELOPLASTY;  Surgeon: Sebastian Ache, MD;  Location: [facility];  Service: Urology;  Laterality: Left;   TONSILLECTOMY     TRANSTHORACIC ECHOCARDIOGRAM  02/23/2016   mild LVH,  ef 50-55%,  grade 1 diastolic dysfunction/  mild to moderate AV calcification w/ no stenosis or regurg./   trivial MR and TR/  mild PR     Initial Focused Assessment (If applicable, or please see trauma documentation): - Alert, oriented x3 (baseline) - PERRLA - Lac to R forehead - Bleeding to lips - Small abrasion to R knee - c/o R shoulder and R elbow pain - C-collar in place - L AC PIV  CT's Completed:   CT Head and CT C-Spine   Interventions:  - Labs drawn - CXR - Pelvic XR - Shoulder XR (R) - Elbow XR (R) - CT head and c-spine - suturing lac to forehead  Plan for disposition:  Discharge home back to memory care.  Consults completed:  none at 1100.  Event Summary: Pt is coming from [facility] after tripping and falling against the doorway this morning.  Pt presents to ED with a lac to the right forehead and his lip.  Pt just had surgery on 5/13 for a R hip fracture.  fentanyl given by EMS.   Bedside handoff with ED RN Victorino Dike.    [name]  Trauma Response RN  Please call TRN at [phone] for further assistance.

## 2023-04-16 NOTE — ED Triage Notes (Signed)
Pt to  ER via EMS from [facility] with c/o trip and fall against doorway this AM. Pt with laceration to right forehead and lip.  Pt c/o right hip pain, surgery to same on 5/13 for fracture. Pt takes Eliquis.  Fentanyl by EMS.

## 2023-04-18 ENCOUNTER — Non-Acute Institutional Stay (SKILLED_NURSING_FACILITY): Payer: Medicare Other | Admitting: Internal Medicine

## 2023-04-18 ENCOUNTER — Encounter: Payer: Self-pay | Admitting: Internal Medicine

## 2023-04-18 DIAGNOSIS — I480 Paroxysmal atrial fibrillation: Secondary | ICD-10-CM

## 2023-04-18 DIAGNOSIS — G308 Other Alzheimer's disease: Secondary | ICD-10-CM

## 2023-04-18 DIAGNOSIS — R41841 Cognitive communication deficit: Secondary | ICD-10-CM | POA: Diagnosis not present

## 2023-04-18 DIAGNOSIS — S32599A Other specified fracture of unspecified pubis, initial encounter for closed fracture: Secondary | ICD-10-CM

## 2023-04-18 DIAGNOSIS — W19XXXD Unspecified fall, subsequent encounter: Secondary | ICD-10-CM | POA: Diagnosis not present

## 2023-04-18 DIAGNOSIS — F02B3 Dementia in other diseases classified elsewhere, moderate, with mood disturbance: Secondary | ICD-10-CM

## 2023-04-18 DIAGNOSIS — R2681 Unsteadiness on feet: Secondary | ICD-10-CM | POA: Diagnosis not present

## 2023-04-18 DIAGNOSIS — I10 Essential (primary) hypertension: Secondary | ICD-10-CM

## 2023-04-18 DIAGNOSIS — R296 Repeated falls: Secondary | ICD-10-CM | POA: Diagnosis not present

## 2023-04-18 DIAGNOSIS — R4189 Other symptoms and signs involving cognitive functions and awareness: Secondary | ICD-10-CM | POA: Diagnosis not present

## 2023-04-18 DIAGNOSIS — M6389 Disorders of muscle in diseases classified elsewhere, multiple sites: Secondary | ICD-10-CM | POA: Diagnosis not present

## 2023-04-18 DIAGNOSIS — M62561 Muscle wasting and atrophy, not elsewhere classified, right lower leg: Secondary | ICD-10-CM | POA: Diagnosis not present

## 2023-04-18 DIAGNOSIS — M79651 Pain in right thigh: Secondary | ICD-10-CM | POA: Diagnosis not present

## 2023-04-18 DIAGNOSIS — S32591D Other specified fracture of right pubis, subsequent encounter for fracture with routine healing: Secondary | ICD-10-CM | POA: Diagnosis not present

## 2023-04-18 DIAGNOSIS — S32411D Displaced fracture of anterior wall of right acetabulum, subsequent encounter for fracture with routine healing: Secondary | ICD-10-CM | POA: Diagnosis not present

## 2023-04-18 DIAGNOSIS — I2510 Atherosclerotic heart disease of native coronary artery without angina pectoris: Secondary | ICD-10-CM

## 2023-04-18 DIAGNOSIS — R278 Other lack of coordination: Secondary | ICD-10-CM | POA: Diagnosis not present

## 2023-04-18 DIAGNOSIS — F4321 Adjustment disorder with depressed mood: Secondary | ICD-10-CM | POA: Diagnosis not present

## 2023-04-18 NOTE — Progress Notes (Signed)
Provider:   Location:  [facility] Room Number: 157A Place of Service:  SNF (31)  PCP: [name], MD Patient Care Team: [name], MD as PCP - General (Internal Medicine) Quintella Reichert, MD as PCP - Cardiology (Cardiology) Hillis Range, MD (Inactive) as PCP - Electrophysiology (Cardiology) Hilarie Fredrickson, MD (Gastroenterology) Hillis Range, MD (Inactive) (Cardiology) Donnetta Hail, MD as Consulting Physician (Rheumatology) Berneice Heinrich Delbert Phenix., MD as Consulting Physician (Urology) Antony Contras, MD as Consulting Physician (Ophthalmology) Dannielle Burn (Dentistry) Eileen Stanford, MD as Referring Physician (Allergy and Immunology) Van Clines, MD as Consulting Physician (Neurology) Kathyrn Sheriff, [facility] as Pharmacist (Pharmacist) Van Clines, MD as Consulting Physician (Neurology)  Extended Emergency Contact Information Primary Emergency Contact: Forinash,[name] Address: [address]          [place], [place] Home Phone: [phone] Work Phone: [phone] Mobile Phone: [phone] Relation: Spouse Secondary Emergency Contact: [name],[name] Mobile Phone: [phone] Relation: Daughter  Code Status: DNR Goals of Care: Advanced Directive information    04/18/2023    9:09 AM  Advanced Directives  Does Patient Have a Medical Advance Directive? Yes  Type of Estate agent of [place];Living will;Out of facility DNR (pink MOST or yellow form)  Does patient want to make changes to medical advance directive? No - Patient declined  Copy of Healthcare Power of Attorney in Chart? Yes - validated most recent copy scanned in chart (See row information)  Pre-existing out of facility DNR order (yellow form or pink MOST form) Yellow form placed in chart (order not valid for inpatient use)      Chief Complaint  Patient presents with   New Admit To SNF    Patient is being  seen for readmit to SNF    HPI: Patient is a 89 y.o. male seen today for admission to Readmit to rehab after his Fall in Memory unit  Patient has h/o Dementia with behavior issues, Rheumatoid Arthritis, A Fib, HTN, CAD s/p PCI, GERD and Sleep apnea And Depression   SDH due to Fall in 11/23  Fell in 05/13-05/17 Sustained Right Acetabular Fracture and Right Pubic Rami fracture ORIF per Dr handy in 05/14  Continues to have Behavior issues Including Cursing Hitting Larey Seat again and had to be send to ED for Laceration repair CT head shows  Moderate cerebral and mild cerebellar atrophy with extensive chronic microvascular ischemic changes in the cerebral white matter, CT neck showed Severe multilevel degenerative disc disease and cervical spondylosis, status post ACDF at C4-C6  BacK in Rehab Was asking for his wife Had Sutures on his head and Lips     Past Medical History:  Diagnosis Date   CAD (coronary artery disease)    a. s/p inferior STEMI 01/08/2014; LHC 01/08/14: total RCA occlusion s/p 3.5x43mm Xience DES distal RCA and 3.5x28 mm DES mid RCA, 60-70% mid LAD stenosis, EF 55%.   Complication of anesthesia    'long to wake up after back surgery " 09/2003   Diverticulosis of colon    GERD (gastroesophageal reflux disease)    History of cellulitis    05-26-2015  LLE   History of colon polyps    1998- benign/  2008 adenomatous    History of kidney stones    2013   History of squamous cell carcinoma excision    2013;  2015;  06-12-2015 right leg/  02/ and 05/ 2017  left ear and left  leg   Hydronephrosis, left    Hyperlipidemia    Hypertension    Migraine with aura    OSA on CPAP 06/19/2015   Moderate OSA with AHI 18/hr  per study 05-20-2015   Osteoarthritis    Paroxysmal atrial fibrillation (HCC) 4/16   chads2vasc score is at least 4   Premature atrial contractions    RA (rheumatoid arthritis) [facility])    rheumatologist-  dr Alben Deeds   Sinusitis, chronic 10/17/2015    Wears glasses    Wears hearing aid    bilateral   Past Surgical History:  Procedure Laterality Date   BACK SURGERY     CARDIOVASCULAR STRESS TEST  11/21/2015   Low risk nuclear study w/ a small diaphragmatic attenuation artifact, no ischemia/  normal LV function and wall motion , ef 63%   CATARACT EXTRACTION W/ INTRAOCULAR LENS  IMPLANT, BILATERAL  2015   COLONOSCOPY  last one 06-09-2011   CORONARY ANGIOPLASTY     CYSTOSCOPY W/ RETROGRADES Left 07/12/2016   Procedure: CYSTOSCOPY WITH RETROGRADE PYELOGRAM LEFT URETERAL STENT;  Surgeon: Bjorn Pippin, MD;  Location: [facility];  Service: Urology;  Laterality: Left;   CYSTOSCOPY WITH RETROGRADE PYELOGRAM, URETEROSCOPY AND STENT PLACEMENT Left 08/11/2016   Procedure: CYSTOSCOPY WITH RETROGRADE PYELOGRAM,  DIAGNOSTIC URETEROSCOPY , STENT EXCHANGE;  Surgeon: Sebastian Ache, MD;  Location: [facility];  Service: Urology;  Laterality: Left;   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   severe fibromatosis palm and fingers   LEFT HEART CATHETERIZATION WITH CORONARY ANGIOGRAM N/A 01/08/2014   Procedure: LEFT HEART CATHETERIZATION WITH CORONARY ANGIOGRAM;  Surgeon: Lennette Bihari, MD;  Location: [facility] CATH LAB;  Service: Cardiovascular;  Laterality:N/A;  total/ subtotal RCA/  mLAD 60-70% w/ mid systolic bridging/  preserved global LVF, ef 55%   MOHS SURGERY  x2  feb and may 2017   left ear /  left leg  (SCC)   ORIF ACETABULAR FRACTURE Right 03/29/2023   Procedure: OPEN REDUCTION INTERNAL FIXATION (ORIF) ACETABULAR FRACTURE;  Surgeon: Myrene Galas, MD;  Location: [facility] OR;  Service: Orthopedics;  Laterality: Right;   PERCUTANEOUS CORONARY STENT INTERVENTION (PCI-S)  01/08/2014   Procedure: PERCUTANEOUS CORONARY STENT INTERVENTION (PCI-S);  Surgeon: Lennette Bihari, MD;  Location: [facility] CATH LAB;  Service: Cardiovascular;;  DES to mid and distal RCA   POSTERIOR LUMBAR FUSION  10/01/2003   and Laminectomy/ diskectomy  L4 -- S1   ROBOT ASSISTED  PYELOPLASTY Left 09/15/2016   Procedure: XI ROBOTIC ASSISTED PYELOPLASTY;  Surgeon: Sebastian Ache, MD;  Location: [facility];  Service: Urology;  Laterality: Left;   TONSILLECTOMY     TRANSTHORACIC ECHOCARDIOGRAM  02/23/2016   mild LVH,  ef 50-55%,  grade 1 diastolic dysfunction/  mild to moderate AV calcification w/ no stenosis or regurg./  trivial MR and TR/  mild PR    reports that he quit smoking about 54 years ago. His smoking use included cigarettes. He has never used smokeless tobacco. He reports that he does not currently use alcohol after a past usage of about 2.0 standard drinks of alcohol per week. He reports that he does not use drugs. Social History   Socioeconomic History   Marital status: Married    Spouse name: Not on file   Number of children: 2   Years of education: Not on file   Highest education level: Not on file  Occupational History   Occupation: Retired    Associate Professor: RETIRED  Tobacco Use   Smoking status: Former  Years: 10    Types: Cigarettes    Quit date: 08/09/1968    Years since quitting: 54.7   Smokeless tobacco: Never  Vaping Use   Vaping Use: Never used  Substance and Sexual Activity   Alcohol use: Not Currently    Alcohol/week: 2.0 standard drinks of alcohol    Types: 2 Shots of liquor per week    Comment: daily   Drug use: No   Sexual activity: Yes    Partners: Female  Other Topics Concern   Not on file  Social History Narrative   1 Caffeine drink daily    Right handed    house   Lives with wife   retired      International aid/development worker of Health   Financial Resource Strain: Low Risk  (06/25/2022)   Overall Financial Resource Strain (CARDIA)    Difficulty of Paying Living Expenses: Not hard at all  Food Insecurity: No Food Insecurity (03/28/2023)   Hunger Vital Sign    Worried About Running Out of Food in the Last Year: Never true    Ran Out of Food in the Last Year: Never true  Transportation Needs: No Transportation Needs (03/28/2023)    PRAPARE - Administrator, Civil Service (Medical): No    Lack of Transportation (Non-Medical): No  Physical Activity: Insufficiently Active (06/25/2022)   Exercise Vital Sign    Days of Exercise per Week: 3 days    Minutes of Exercise per Session: 30 min  Stress: No Stress Concern Present (06/25/2022)   [employer] of Occupational Health - Occupational Stress Questionnaire    Feeling of Stress : Not at all  Social Connections: Moderately Integrated (06/25/2022)   Social Connection and Isolation Panel [NHANES]    Frequency of Communication with Friends and Family: Three times a week    Frequency of Social Gatherings with Friends and Family: Three times a week    Attends Religious Services: More than 4 times per year    Active Member of Clubs or Organizations: No    Attends Banker Meetings: Never    Marital Status: Married  Catering manager Violence: Not At Risk (03/28/2023)   Humiliation, Afraid, Rape, and Kick questionnaire    Fear of Current or Ex-Partner: No    Emotionally Abused: No    Physically Abused: No    Sexually Abused: No    Functional Status Survey:    Family History  Problem Relation Age of Onset   Hypertension Mother    Heart disease Father    Colon cancer Neg Hx     Health Maintenance  Topic Date Due   COVID-19 Vaccine (5 - 2023-24 season) 05/06/2023   INFLUENZA VACCINE  06/16/2023   Medicare Annual Wellness (AWV)  06/26/2023   DTaP/Tdap/Td (3 - Td or Tdap) 09/22/2032   Pneumonia Vaccine 46+ Years old  Completed   Zoster Vaccines- Shingrix  Completed   HPV VACCINES  Aged Out    Allergies  Allergen Reactions   Methotrexate Other (See Comments)    REACTION: tachycardia   Aricept [Donepezil Hcl] Other (See Comments)    Nightmares. Pt takes this medication per [facility]    Crestor [Rosuvastatin Calcium] Other (See Comments)    Myalgias with 20mg  dose   Lisinopril Other (See Comments)    cough   Namenda [Memantine] Diarrhea and  Nausea Only   Atorvastatin Other (See Comments)    Muscle pain, leg cramps   Depakote [Divalproex Sodium] Rash   Pravastatin  Sodium Other (See Comments)    REACTION: cramps, fatigue    Outpatient Encounter Medications as of 04/18/2023  Medication Sig   acetaminophen (TYLENOL) 325 MG tablet Take 2 tablets (650 mg total) by mouth in the morning and at bedtime.   apixaban (ELIQUIS) 5 MG TABS tablet Take 1 tablet (5 mg total) by mouth 2 (two) times daily.   camphor-menthol (SARNA) lotion Apply 1 Application topically 2 (two) times daily as needed for itching.   docusate sodium (COLACE) 100 MG capsule Take 1 capsule (100 mg total) by mouth 2 (two) times daily.   donepezil (ARICEPT) 5 MG tablet TAKE ONE TABLET BY MOUTH EVERY MORNING   escitalopram (LEXAPRO) 20 MG tablet Take 20 mg by mouth daily.   Evolocumab (REPATHA SURECLICK) 140 MG/ML SOAJ INJECT 1 PEN INTO SKIN EVERY 14 DAYS (Patient taking differently: Inject 140 mg into the skin every 14 (fourteen) days.)   ezetimibe (ZETIA) 10 MG tablet Take 5 mg by mouth daily.   feeding supplement (ENSURE ENLIVE / ENSURE PLUS) LIQD Take 237 mLs by mouth 3 (three) times daily between meals. (Patient taking differently: Take 237 mLs by mouth 3 (three) times daily as needed (Supplement).)   gabapentin (NEURONTIN) 300 MG capsule Take 1 capsule (300 mg total) by mouth 3 (three) times daily.   haloperidol (HALDOL) 1 MG tablet Take 1 mg by mouth 2 (two) times daily.   hydrOXYzine (ATARAX) 25 MG tablet Take 25 mg by mouth 2 (two) times daily as needed for itching.   LORazepam (ATIVAN) 0.5 MG tablet Take 0.5 mg by mouth 2 (two) times daily as needed for anxiety.   melatonin 5 MG TABS Take 5 mg by mouth at bedtime.   methocarbamol (ROBAXIN) 500 MG tablet Take 1 tablet (500 mg total) by mouth every 6 (six) hours as needed for muscle spasms.   metoprolol tartrate (LOPRESSOR) 25 MG tablet Take 25 mg by mouth at bedtime. Hold for SBP <100   Multiple Vitamin  (MULTIVITAMIN WITH MINERALS) TABS tablet Take 1 tablet by mouth daily.   nitroGLYCERIN (NITROSTAT) 0.4 MG SL tablet Place 0.4 mg under the tongue every 5 (five) minutes as needed for chest pain.   omeprazole (PRILOSEC) 40 MG capsule Take 40 mg by mouth in the morning. Take on empty stomach 30 minutes before breakfast.   Oxcarbazepine (TRILEPTAL) 300 MG tablet Take 300 mg by mouth 2 (two) times daily.   oxyCODONE-acetaminophen (PERCOCET) 5-325 MG tablet Take 1 tablet by mouth every 6 (six) hours as needed for severe pain or moderate pain.   rosuvastatin (CRESTOR) 5 MG tablet Take 5 mg by mouth every morning.   carbamazepine (TEGRETOL) 200 MG tablet Take 200 mg by mouth 2 (two) times daily. (Patient not taking: Reported on 04/16/2023)   QUEtiapine (SEROQUEL) 25 MG tablet Take 1 tablet (25 mg total) by mouth 2 (two) times daily. (Patient not taking: Reported on 04/16/2023)   [DISCONTINUED] apixaban (ELIQUIS) 2.5 MG TABS tablet Take 1 tablet (2.5 mg total) by mouth 2 (two) times daily.   No facility-administered encounter medications on file as of 04/18/2023.    Review of Systems  Constitutional:  Positive for activity change. Negative for appetite change and unexpected weight change.  HENT: Negative.    Respiratory:  Negative for cough and shortness of breath.   Cardiovascular:  Negative for leg swelling.  Gastrointestinal:  Negative for constipation.  Genitourinary:  Negative for frequency.  Musculoskeletal:  Positive for gait problem. Negative for arthralgias and myalgias.  Skin: Negative.  Negative for rash.  Neurological:  Negative for dizziness and weakness.  Psychiatric/Behavioral:  Positive for agitation, behavioral problems and confusion. Negative for sleep disturbance.   All other systems reviewed and are negative.   Vitals:   04/18/23 0907  BP: 133/70  Pulse: 75  Resp: 16  Temp: 98.7 F (37.1 C)  TempSrc: Temporal  SpO2: 94%  Weight: 192 lb (87.1 kg)  Height: 6' (1.829 m)    Body mass index is 26.04 kg/m. Physical Exam Vitals reviewed.  Constitutional:      Appearance: Normal appearance.  HENT:     Head: Normocephalic.     Comments: Bruising    Nose: Nose normal.     Mouth/Throat:     Mouth: Mucous membranes are moist.     Pharynx: Oropharynx is clear.  Eyes:     Pupils: Pupils are equal, round, and reactive to light.  Cardiovascular:     Rate and Rhythm: Normal rate and regular rhythm.     Pulses: Normal pulses.     Heart sounds: No murmur heard. Pulmonary:     Effort: Pulmonary effort is normal. No respiratory distress.     Breath sounds: Normal breath sounds. No rales.  Abdominal:     General: Abdomen is flat. Bowel sounds are normal.     Palpations: Abdomen is soft.  Musculoskeletal:        General: No swelling.     Cervical back: Neck supple.  Skin:    General: Skin is warm.     Comments: Has 2 Lacerations one near Right Forehead and other on his Right side of Lips  Neurological:     General: No focal deficit present.     Mental Status: He is alert.  Psychiatric:        Mood and Affect: Mood normal.        Thought Content: Thought content normal.     Labs reviewed: Basic Metabolic Panel: Recent Labs    03/29/23 0942 03/29/23 1340 03/30/23 0633 03/31/23 0219 04/05/23 0000 04/16/23 1002  NA 137   < > 137 135 133* 138  K 3.6   < > 4.0 3.5 4.0 3.9  CL 104   < > 104 102 99 105  CO2 25  --  25 23 23* 27  GLUCOSE 112*   < > 148* 112*  --  104*  BUN 19   < > 16 20 19 13   CREATININE 0.91   < > 0.88 0.77 0.8 0.87  CALCIUM 8.7*  --  8.2* 8.3* 9.0 8.7*  MG 2.1  --  2.0 2.1  --   --    < > = values in this interval not displayed.   Liver Function Tests: Recent Labs    10/02/22 0350 10/11/22 0600 12/26/22 1414 01/26/23 0000 03/28/23 0815  AST 24   < > 30 19 17   ALT 16   < > 12 12 16   ALKPHOS 61   < > 85 79 56  BILITOT 0.6  --  1.0  --  0.6  PROT 7.0  --  7.8  --  6.8  ALBUMIN 4.0   < > 3.8 3.8 3.6   < > = values in  this interval not displayed.   No results for input(s): "LIPASE", "AMYLASE" in the last 8760 hours. No results for input(s): "AMMONIA" in the last 8760 hours. CBC: Recent Labs    07/29/22 1345 09/24/22 0000 12/26/22 1414 03/28/23 0815 03/30/23 1610 03/31/23  1610 04/05/23 0000 04/16/23 1002  WBC 5.3   < > 8.1   < > 7.5 7.5 7.8 8.0  NEUTROABS 2.7  --  6.2  --   --   --   --  5.6  HGB 12.8*   < > 12.7*   < > 9.4* 9.4* 10.1* 10.9*  HCT 38.2*   < > 38.3*   < > 28.1* 27.9* 30* 34.3*  MCV 99.3   < > 97.0   < > 94.9 93.6  --  98.0  PLT 274.0   < > 272   < > 197 221 434* 466*   < > = values in this interval not displayed.   Cardiac Enzymes: No results for input(s): "CKTOTAL", "CKMB", "CKMBINDEX", "TROPONINI" in the last 8760 hours. BNP: Invalid input(s): "POCBNP" Lab Results  Component Value Date   HGBA1C 6.4 08/30/2017   Lab Results  Component Value Date   TSH 0.79 01/26/2023   Lab Results  Component Value Date   VITAMINB12 306 07/29/2022   No results found for: "FOLATE" No results found for: "IRON", "TIBC", "FERRITIN"  Imaging and Procedures obtained prior to SNF admission: CT HEAD WO CONTRAST  Result Date: 04/16/2023 CLINICAL DATA:  89 year old male with history of trauma from a fall. EXAM: CT HEAD WITHOUT CONTRAST CT CERVICAL SPINE WITHOUT CONTRAST TECHNIQUE: Multidetector CT imaging of the head and cervical spine was performed following the standard protocol without intravenous contrast. Multiplanar CT image reconstructions of the cervical spine were also generated. RADIATION DOSE REDUCTION: This exam was performed according to the departmental dose-optimization program which includes automated exposure control, adjustment of the mA and/or kV according to patient size and/or use of iterative reconstruction technique. COMPARISON:  Head CT 03/28/2023. CT of the cervical spine 10/02/2022. FINDINGS: CT HEAD FINDINGS Brain: Moderate cerebral and mild cerebellar atrophy with ex  vacuo dilatation of the ventricular system. Patchy and confluent areas of decreased attenuation are noted throughout the deep and periventricular white matter of the cerebral hemispheres bilaterally, compatible with chronic microvascular ischemic disease. No evidence of acute infarction, hemorrhage, hydrocephalus, extra-axial collection or mass lesion/mass effect. Vascular: No hyperdense vessel or unexpected calcification. Skull: Normal. Negative for fracture or focal lesion. Sinuses/Orbits: No acute finding. Other: None. CT CERVICAL SPINE FINDINGS Alignment: Straightening of normal cervical lordosis, chronic and similar to the prior study. Alignment is otherwise anatomic. Skull base and vertebrae: Postoperative changes of ACDF are noted at C4-C6 with interbody grafts at C4-C5 and C5-C6. No acute displaced fractures are noted. Chronic compression of superior endplate of T1 with 20% loss of anterior vertebral body height, similar to the prior study. Soft tissues and spinal canal: No prevertebral fluid or swelling. No visible canal hematoma. Disc levels: Status post ACDF at C4-C6. Multilevel degenerative disc disease, most severe at C6-C7. Severe multilevel facet arthropathy bilaterally. Upper chest: Unremarkable. Other: None. IMPRESSION: 1. No evidence of significant acute traumatic injury to the skull, brain or cervical spine. 2. Moderate cerebral and mild cerebellar atrophy with extensive chronic microvascular ischemic changes in the cerebral white matter, similar to the prior study, as above. 3. Severe multilevel degenerative disc disease and cervical spondylosis, status post ACDF at C4-C6, as above. Electronically Signed   By: Trudie Reed M.D.   On: 04/16/2023 10:25   CT CERVICAL SPINE WO CONTRAST  Result Date: 04/16/2023 CLINICAL DATA:  89 year old male with history of trauma from a fall. EXAM: CT HEAD WITHOUT CONTRAST CT CERVICAL SPINE WITHOUT CONTRAST TECHNIQUE: Multidetector CT imaging of the  head and  cervical spine was performed following the standard protocol without intravenous contrast. Multiplanar CT image reconstructions of the cervical spine were also generated. RADIATION DOSE REDUCTION: This exam was performed according to the departmental dose-optimization program which includes automated exposure control, adjustment of the mA and/or kV according to patient size and/or use of iterative reconstruction technique. COMPARISON:  Head CT 03/28/2023. CT of the cervical spine 10/02/2022. FINDINGS: CT HEAD FINDINGS Brain: Moderate cerebral and mild cerebellar atrophy with ex vacuo dilatation of the ventricular system. Patchy and confluent areas of decreased attenuation are noted throughout the deep and periventricular white matter of the cerebral hemispheres bilaterally, compatible with chronic microvascular ischemic disease. No evidence of acute infarction, hemorrhage, hydrocephalus, extra-axial collection or mass lesion/mass effect. Vascular: No hyperdense vessel or unexpected calcification. Skull: Normal. Negative for fracture or focal lesion. Sinuses/Orbits: No acute finding. Other: None. CT CERVICAL SPINE FINDINGS Alignment: Straightening of normal cervical lordosis, chronic and similar to the prior study. Alignment is otherwise anatomic. Skull base and vertebrae: Postoperative changes of ACDF are noted at C4-C6 with interbody grafts at C4-C5 and C5-C6. No acute displaced fractures are noted. Chronic compression of superior endplate of T1 with 20% loss of anterior vertebral body height, similar to the prior study. Soft tissues and spinal canal: No prevertebral fluid or swelling. No visible canal hematoma. Disc levels: Status post ACDF at C4-C6. Multilevel degenerative disc disease, most severe at C6-C7. Severe multilevel facet arthropathy bilaterally. Upper chest: Unremarkable. Other: None. IMPRESSION: 1. No evidence of significant acute traumatic injury to the skull, brain or cervical spine. 2. Moderate  cerebral and mild cerebellar atrophy with extensive chronic microvascular ischemic changes in the cerebral white matter, similar to the prior study, as above. 3. Severe multilevel degenerative disc disease and cervical spondylosis, status post ACDF at C4-C6, as above. Electronically Signed   By: Trudie Reed M.D.   On: 04/16/2023 10:25   DG Shoulder Right Port  Result Date: 04/16/2023 CLINICAL DATA:  88 year old male with history of trauma from a fall complaining of right shoulder pain. EXAM: RIGHT SHOULDER - 1 VIEW COMPARISON:  No priors. FINDINGS: There is no evidence of fracture or dislocation. Degenerative changes of osteoarthritis are noted in the glenohumeral and acromioclavicular joints. Soft tissues are unremarkable. IMPRESSION: 1. No acute radiographic abnormality of the right shoulder. Electronically Signed   By: Trudie Reed M.D.   On: 04/16/2023 10:19   DG Elbow Complete Right  Result Date: 04/16/2023 CLINICAL DATA:  89 year old male with history of trauma from a fall complaining of right elbow obtained. EXAM: RIGHT ELBOW - COMPLETE 3+ VIEW COMPARISON:  No priors. FINDINGS: There is no evidence of fracture, dislocation, or joint effusion. There is no evidence of arthropathy or other focal bone abnormality. Soft tissues are unremarkable. IMPRESSION: Negative. Electronically Signed   By: Trudie Reed M.D.   On: 04/16/2023 10:15   DG Chest Port 1 View  Result Date: 04/16/2023 CLINICAL DATA:  89 year old male with history of trauma from a fall. EXAM: PORTABLE CHEST 1 VIEW COMPARISON:  Chest x-ray 03/28/2023. FINDINGS: Lung volumes are normal. No consolidative airspace disease. No pleural effusions. No pneumothorax. No pulmonary nodule or mass noted. Pulmonary vasculature and the cardiomediastinal silhouette are within normal limits. Atherosclerosis in the thoracic aorta. Multiple old healed posterior left-sided rib fractures are again noted. Orthopedic fixation hardware in the lower  cervical spine incidentally noted. IMPRESSION: 1.  No radiographic evidence of acute cardiopulmonary disease. 2. Aortic atherosclerosis. Electronically Signed   By:  Trudie Reed M.D.   On: 04/16/2023 10:15   DG Pelvis Portable  Result Date: 04/16/2023 CLINICAL DATA:  89 year old male with history of trauma from a fall. EXAM: PORTABLE PELVIS 1-2 VIEWS COMPARISON:  03/29/2023. FINDINGS: Plate and screw fixation device in the right hemipelvis traversing an old healed right acetabular fracture, similar to the prior study. No definite acute displaced fracture of the bony pelvic ring or either proximal femur as visualized. Bilateral femoral heads project over the acetabuli on this single view examination. Degenerative changes of moderate osteoarthritis are noted in both hip joints. Atherosclerotic calcifications. IMPRESSION: 1. No acute radiographic abnormality of the bony pelvis on this single view examination. Status post ORIF in the right hemipelvis for old right acetabular fracture. 2. Atherosclerosis. Electronically Signed   By: Trudie Reed M.D.   On: 04/16/2023 10:14    Assessment/Plan 1. Fall, subsequent encounter Continues to be fall risk He is in Rehab Trying to get up    2. 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 Primary hypertension Metoprolol was increased per Alwyn Ren for Hypertension BP more controlled now   5. Coronary artery disease involving native coronary artery of native heart without angina pectoris Statin and  Metoprolol 6. Paroxysmal atrial fibrillation (HCC) Eliquis 7 Mixed hyperlipidemia Repatha and Zetia and crestor LDL 39 in 03/24 and Metoprolol 8 GERD Prilosec   Family/ staff Communication:   Labs/tests ordered:

## 2023-04-19 DIAGNOSIS — M6389 Disorders of muscle in diseases classified elsewhere, multiple sites: Secondary | ICD-10-CM | POA: Diagnosis not present

## 2023-04-19 DIAGNOSIS — R4189 Other symptoms and signs involving cognitive functions and awareness: Secondary | ICD-10-CM | POA: Diagnosis not present

## 2023-04-19 DIAGNOSIS — M79651 Pain in right thigh: Secondary | ICD-10-CM | POA: Diagnosis not present

## 2023-04-19 DIAGNOSIS — R278 Other lack of coordination: Secondary | ICD-10-CM | POA: Diagnosis not present

## 2023-04-19 DIAGNOSIS — S32411D Displaced fracture of anterior wall of right acetabulum, subsequent encounter for fracture with routine healing: Secondary | ICD-10-CM | POA: Diagnosis not present

## 2023-04-19 DIAGNOSIS — F4321 Adjustment disorder with depressed mood: Secondary | ICD-10-CM | POA: Diagnosis not present

## 2023-04-19 DIAGNOSIS — S32591D Other specified fracture of right pubis, subsequent encounter for fracture with routine healing: Secondary | ICD-10-CM | POA: Diagnosis not present

## 2023-04-20 DIAGNOSIS — F4321 Adjustment disorder with depressed mood: Secondary | ICD-10-CM | POA: Diagnosis not present

## 2023-04-20 DIAGNOSIS — S32591D Other specified fracture of right pubis, subsequent encounter for fracture with routine healing: Secondary | ICD-10-CM | POA: Diagnosis not present

## 2023-04-20 DIAGNOSIS — M6389 Disorders of muscle in diseases classified elsewhere, multiple sites: Secondary | ICD-10-CM | POA: Diagnosis not present

## 2023-04-20 DIAGNOSIS — M79651 Pain in right thigh: Secondary | ICD-10-CM | POA: Diagnosis not present

## 2023-04-20 DIAGNOSIS — R4189 Other symptoms and signs involving cognitive functions and awareness: Secondary | ICD-10-CM | POA: Diagnosis not present

## 2023-04-20 DIAGNOSIS — R278 Other lack of coordination: Secondary | ICD-10-CM | POA: Diagnosis not present

## 2023-04-20 DIAGNOSIS — S32411D Displaced fracture of anterior wall of right acetabulum, subsequent encounter for fracture with routine healing: Secondary | ICD-10-CM | POA: Diagnosis not present

## 2023-04-21 DIAGNOSIS — S32591D Other specified fracture of right pubis, subsequent encounter for fracture with routine healing: Secondary | ICD-10-CM | POA: Diagnosis not present

## 2023-04-21 DIAGNOSIS — M6389 Disorders of muscle in diseases classified elsewhere, multiple sites: Secondary | ICD-10-CM | POA: Diagnosis not present

## 2023-04-21 DIAGNOSIS — R4189 Other symptoms and signs involving cognitive functions and awareness: Secondary | ICD-10-CM | POA: Diagnosis not present

## 2023-04-21 DIAGNOSIS — S32411D Displaced fracture of anterior wall of right acetabulum, subsequent encounter for fracture with routine healing: Secondary | ICD-10-CM | POA: Diagnosis not present

## 2023-04-21 DIAGNOSIS — R278 Other lack of coordination: Secondary | ICD-10-CM | POA: Diagnosis not present

## 2023-04-21 DIAGNOSIS — F4321 Adjustment disorder with depressed mood: Secondary | ICD-10-CM | POA: Diagnosis not present

## 2023-04-21 DIAGNOSIS — M79651 Pain in right thigh: Secondary | ICD-10-CM | POA: Diagnosis not present

## 2023-04-22 DIAGNOSIS — S32411D Displaced fracture of anterior wall of right acetabulum, subsequent encounter for fracture with routine healing: Secondary | ICD-10-CM | POA: Diagnosis not present

## 2023-04-22 DIAGNOSIS — M6389 Disorders of muscle in diseases classified elsewhere, multiple sites: Secondary | ICD-10-CM | POA: Diagnosis not present

## 2023-04-22 DIAGNOSIS — S32591D Other specified fracture of right pubis, subsequent encounter for fracture with routine healing: Secondary | ICD-10-CM | POA: Diagnosis not present

## 2023-04-22 DIAGNOSIS — F4321 Adjustment disorder with depressed mood: Secondary | ICD-10-CM | POA: Diagnosis not present

## 2023-04-22 DIAGNOSIS — R278 Other lack of coordination: Secondary | ICD-10-CM | POA: Diagnosis not present

## 2023-04-22 DIAGNOSIS — R4189 Other symptoms and signs involving cognitive functions and awareness: Secondary | ICD-10-CM | POA: Diagnosis not present

## 2023-04-22 DIAGNOSIS — M79651 Pain in right thigh: Secondary | ICD-10-CM | POA: Diagnosis not present

## 2023-04-23 DIAGNOSIS — M79651 Pain in right thigh: Secondary | ICD-10-CM | POA: Diagnosis not present

## 2023-04-23 DIAGNOSIS — S32411D Displaced fracture of anterior wall of right acetabulum, subsequent encounter for fracture with routine healing: Secondary | ICD-10-CM | POA: Diagnosis not present

## 2023-04-23 DIAGNOSIS — R278 Other lack of coordination: Secondary | ICD-10-CM | POA: Diagnosis not present

## 2023-04-23 DIAGNOSIS — S32591D Other specified fracture of right pubis, subsequent encounter for fracture with routine healing: Secondary | ICD-10-CM | POA: Diagnosis not present

## 2023-04-23 DIAGNOSIS — R4189 Other symptoms and signs involving cognitive functions and awareness: Secondary | ICD-10-CM | POA: Diagnosis not present

## 2023-04-23 DIAGNOSIS — M6389 Disorders of muscle in diseases classified elsewhere, multiple sites: Secondary | ICD-10-CM | POA: Diagnosis not present

## 2023-04-23 DIAGNOSIS — F4321 Adjustment disorder with depressed mood: Secondary | ICD-10-CM | POA: Diagnosis not present

## 2023-04-25 DIAGNOSIS — S32591D Other specified fracture of right pubis, subsequent encounter for fracture with routine healing: Secondary | ICD-10-CM | POA: Diagnosis not present

## 2023-04-25 DIAGNOSIS — R278 Other lack of coordination: Secondary | ICD-10-CM | POA: Diagnosis not present

## 2023-04-25 DIAGNOSIS — S32411D Displaced fracture of anterior wall of right acetabulum, subsequent encounter for fracture with routine healing: Secondary | ICD-10-CM | POA: Diagnosis not present

## 2023-04-25 DIAGNOSIS — M79651 Pain in right thigh: Secondary | ICD-10-CM | POA: Diagnosis not present

## 2023-04-25 DIAGNOSIS — F4321 Adjustment disorder with depressed mood: Secondary | ICD-10-CM | POA: Diagnosis not present

## 2023-04-25 DIAGNOSIS — M6389 Disorders of muscle in diseases classified elsewhere, multiple sites: Secondary | ICD-10-CM | POA: Diagnosis not present

## 2023-04-25 DIAGNOSIS — R4189 Other symptoms and signs involving cognitive functions and awareness: Secondary | ICD-10-CM | POA: Diagnosis not present

## 2023-04-26 DIAGNOSIS — R278 Other lack of coordination: Secondary | ICD-10-CM | POA: Diagnosis not present

## 2023-04-26 DIAGNOSIS — S32411D Displaced fracture of anterior wall of right acetabulum, subsequent encounter for fracture with routine healing: Secondary | ICD-10-CM | POA: Diagnosis not present

## 2023-04-26 DIAGNOSIS — S32591D Other specified fracture of right pubis, subsequent encounter for fracture with routine healing: Secondary | ICD-10-CM | POA: Diagnosis not present

## 2023-04-26 DIAGNOSIS — F4321 Adjustment disorder with depressed mood: Secondary | ICD-10-CM | POA: Diagnosis not present

## 2023-04-26 DIAGNOSIS — R4189 Other symptoms and signs involving cognitive functions and awareness: Secondary | ICD-10-CM | POA: Diagnosis not present

## 2023-04-26 DIAGNOSIS — M6389 Disorders of muscle in diseases classified elsewhere, multiple sites: Secondary | ICD-10-CM | POA: Diagnosis not present

## 2023-04-26 DIAGNOSIS — M79651 Pain in right thigh: Secondary | ICD-10-CM | POA: Diagnosis not present

## 2023-04-27 DIAGNOSIS — S32411D Displaced fracture of anterior wall of right acetabulum, subsequent encounter for fracture with routine healing: Secondary | ICD-10-CM | POA: Diagnosis not present

## 2023-04-27 DIAGNOSIS — S32481D Displaced dome fracture of right acetabulum, subsequent encounter for fracture with routine healing: Secondary | ICD-10-CM | POA: Diagnosis not present

## 2023-04-27 DIAGNOSIS — S32810D Multiple fractures of pelvis with stable disruption of pelvic ring, subsequent encounter for fracture with routine healing: Secondary | ICD-10-CM | POA: Diagnosis not present

## 2023-04-27 DIAGNOSIS — F4321 Adjustment disorder with depressed mood: Secondary | ICD-10-CM | POA: Diagnosis not present

## 2023-04-27 DIAGNOSIS — M79651 Pain in right thigh: Secondary | ICD-10-CM | POA: Diagnosis not present

## 2023-04-27 DIAGNOSIS — R278 Other lack of coordination: Secondary | ICD-10-CM | POA: Diagnosis not present

## 2023-04-27 DIAGNOSIS — M6389 Disorders of muscle in diseases classified elsewhere, multiple sites: Secondary | ICD-10-CM | POA: Diagnosis not present

## 2023-04-27 DIAGNOSIS — S32591D Other specified fracture of right pubis, subsequent encounter for fracture with routine healing: Secondary | ICD-10-CM | POA: Diagnosis not present

## 2023-04-27 DIAGNOSIS — R4189 Other symptoms and signs involving cognitive functions and awareness: Secondary | ICD-10-CM | POA: Diagnosis not present

## 2023-04-28 DIAGNOSIS — M79651 Pain in right thigh: Secondary | ICD-10-CM | POA: Diagnosis not present

## 2023-04-28 DIAGNOSIS — S32411D Displaced fracture of anterior wall of right acetabulum, subsequent encounter for fracture with routine healing: Secondary | ICD-10-CM | POA: Diagnosis not present

## 2023-04-28 DIAGNOSIS — M6389 Disorders of muscle in diseases classified elsewhere, multiple sites: Secondary | ICD-10-CM | POA: Diagnosis not present

## 2023-04-28 DIAGNOSIS — F4321 Adjustment disorder with depressed mood: Secondary | ICD-10-CM | POA: Diagnosis not present

## 2023-04-28 DIAGNOSIS — S32591D Other specified fracture of right pubis, subsequent encounter for fracture with routine healing: Secondary | ICD-10-CM | POA: Diagnosis not present

## 2023-04-28 DIAGNOSIS — R278 Other lack of coordination: Secondary | ICD-10-CM | POA: Diagnosis not present

## 2023-04-28 DIAGNOSIS — R4189 Other symptoms and signs involving cognitive functions and awareness: Secondary | ICD-10-CM | POA: Diagnosis not present

## 2023-04-29 DIAGNOSIS — R4189 Other symptoms and signs involving cognitive functions and awareness: Secondary | ICD-10-CM | POA: Diagnosis not present

## 2023-04-29 DIAGNOSIS — M79651 Pain in right thigh: Secondary | ICD-10-CM | POA: Diagnosis not present

## 2023-04-29 DIAGNOSIS — S32411D Displaced fracture of anterior wall of right acetabulum, subsequent encounter for fracture with routine healing: Secondary | ICD-10-CM | POA: Diagnosis not present

## 2023-04-29 DIAGNOSIS — R278 Other lack of coordination: Secondary | ICD-10-CM | POA: Diagnosis not present

## 2023-04-29 DIAGNOSIS — F4321 Adjustment disorder with depressed mood: Secondary | ICD-10-CM | POA: Diagnosis not present

## 2023-04-29 DIAGNOSIS — F4323 Adjustment disorder with mixed anxiety and depressed mood: Secondary | ICD-10-CM | POA: Diagnosis not present

## 2023-04-29 DIAGNOSIS — S32591D Other specified fracture of right pubis, subsequent encounter for fracture with routine healing: Secondary | ICD-10-CM | POA: Diagnosis not present

## 2023-04-29 DIAGNOSIS — M6389 Disorders of muscle in diseases classified elsewhere, multiple sites: Secondary | ICD-10-CM | POA: Diagnosis not present

## 2023-05-02 DIAGNOSIS — M6389 Disorders of muscle in diseases classified elsewhere, multiple sites: Secondary | ICD-10-CM | POA: Diagnosis not present

## 2023-05-02 DIAGNOSIS — S32411D Displaced fracture of anterior wall of right acetabulum, subsequent encounter for fracture with routine healing: Secondary | ICD-10-CM | POA: Diagnosis not present

## 2023-05-02 DIAGNOSIS — R4189 Other symptoms and signs involving cognitive functions and awareness: Secondary | ICD-10-CM | POA: Diagnosis not present

## 2023-05-02 DIAGNOSIS — S32591D Other specified fracture of right pubis, subsequent encounter for fracture with routine healing: Secondary | ICD-10-CM | POA: Diagnosis not present

## 2023-05-02 DIAGNOSIS — R278 Other lack of coordination: Secondary | ICD-10-CM | POA: Diagnosis not present

## 2023-05-02 DIAGNOSIS — F4321 Adjustment disorder with depressed mood: Secondary | ICD-10-CM | POA: Diagnosis not present

## 2023-05-02 DIAGNOSIS — M79651 Pain in right thigh: Secondary | ICD-10-CM | POA: Diagnosis not present

## 2023-05-03 ENCOUNTER — Telehealth (INDEPENDENT_AMBULATORY_CARE_PROVIDER_SITE_OTHER): Payer: Medicare Other | Admitting: Physician Assistant

## 2023-05-03 ENCOUNTER — Telehealth: Payer: Self-pay | Admitting: Orthopedic Surgery

## 2023-05-03 DIAGNOSIS — S32411D Displaced fracture of anterior wall of right acetabulum, subsequent encounter for fracture with routine healing: Secondary | ICD-10-CM | POA: Diagnosis not present

## 2023-05-03 DIAGNOSIS — G308 Other Alzheimer's disease: Secondary | ICD-10-CM

## 2023-05-03 DIAGNOSIS — F4321 Adjustment disorder with depressed mood: Secondary | ICD-10-CM | POA: Diagnosis not present

## 2023-05-03 DIAGNOSIS — F0150 Vascular dementia without behavioral disturbance: Secondary | ICD-10-CM

## 2023-05-03 DIAGNOSIS — M6389 Disorders of muscle in diseases classified elsewhere, multiple sites: Secondary | ICD-10-CM | POA: Diagnosis not present

## 2023-05-03 DIAGNOSIS — R278 Other lack of coordination: Secondary | ICD-10-CM | POA: Diagnosis not present

## 2023-05-03 DIAGNOSIS — M79651 Pain in right thigh: Secondary | ICD-10-CM | POA: Diagnosis not present

## 2023-05-03 DIAGNOSIS — R4189 Other symptoms and signs involving cognitive functions and awareness: Secondary | ICD-10-CM | POA: Diagnosis not present

## 2023-05-03 DIAGNOSIS — S32591D Other specified fracture of right pubis, subsequent encounter for fracture with routine healing: Secondary | ICD-10-CM | POA: Diagnosis not present

## 2023-05-03 MED ORDER — LORAZEPAM 0.5 MG PO TABS
0.5000 mg | ORAL_TABLET | Freq: Once | ORAL | 0 refills | Status: AC
Start: 2023-05-03 — End: 2023-05-03
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
## 2023-05-03 NOTE — Progress Notes (Signed)
Virtual Visit via Video Note The purpose of this virtual visit is to provide medical care in a patient that is unable to be seen in person due to physical or health limitations   Consent was obtained for video visit:  yes  Answered questions that patient had about telehealth interaction:  yes I discussed the limitations, risks, security and privacy concerns of performing an evaluation and management service by telemedicine. I also discussed with the patient that there may be a patient responsible charge related to this service. The patient expressed understanding and agreed to proceed.  Pt location: Home Physician Location: office Name of referring provider:  Plotnikov, Georgina Quint, MD I connected with [name] at patients initiation/request on 05/03/2023 at  3:30 PM EDT by video enabled telemedicine application and verified that I am speaking with the correct person using two identifiers. Pt MRN:  [id number] Pt DOB:  Sep 24, 1933 Video Participants:  [name];  daughters    Assessment and Plan:    Dementia likely due to Alzheimer's Disease with behavioral disturbance   [name] is a very pleasant 89 y.o. 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  He is currently on donepezil 5 mg daily (he could not tolerate the full dose) as well as  memantine 10 mg daily also due to side effects seen today in follow up for memory loss.  He had a recent fall requiring hospitalization and rehab. He is back at memory care now. Unclear if there is progression of disease or his cognition has been affected by this recent fall, hospital visit. Discussed with daughter that will reassess in a few months, and if cognitive decline s evident. Mood is controlled by PCP. Daughter agrees with plan       Follow up in 6  months. Continue donepezil 5  mg daily. Side  effects were discussed  Continue Memantine 10 mg twice daily. Side effects were discussed  Recommend good control of her cardiovascular risk factors Continue to control mood as per PCP   History of Present Illness:      This patient is accompanied his daughters  who supplement the history.  Previous records as well as any outside records available were reviewed prior to todays visit. Patient was last seen on 11/01/22 with MMSE 22/30     Any changes in memory since last visit?  He is more confused than before, with difficulty remembering recent conversations and people names that he just meets  repeats oneself?  Endorsed  Disoriented in a place?  He gets confused about where he lives and his wife live where his parents are.  Leaving objects in unusual places? No. He is wheelchair bound  Wandering behavior?  denies   Any personality changes since last visit?  He is more agitated, controlled with Ativan prn and hydroxyzine. As before he may be irritable at times.  Any worsening depression?:  "more angry than depressed " Hallucinations or paranoia? " He looks for his parents but does not see them " Seizures?    denies    Any sleep changes?" Not that great" "as always been a sleep walker and now is  it may be a problem ". Denies vivid dreams, REM behavior   Sleep apnea?   denies   Any hygiene concerns?  Not on CPAP because of fall risks  Independent of bathing and dressing?  Needs assistance, has a nurse aide to help on a regular basis . he gets more agitated with showering and dressing Does the patient needs help with medications?Staff is in charge  Who is in charge of the finances?  Family is in charge      Any changes in appetite?  denies     Patient have trouble swallowing?  denies   Does the patient cook?  Any kitchen accidents such as leaving the stove on? Patient denies   Any headaches?   denies   Chronic back pain  denies   Ambulates with difficulty?   He is wheelchair bound  after hip fracture after fall Unilateral weakness, numbness or tingling?    denies   Any tremors?  denies   Any anosmia?  Patient denies   Any incontinence of urine?  denies   Any bowel dysfunction?     denies      Patient lives  with wife at [facility]   Does the patient drive?  He no longer does.    History on Initial Assessment 04/11/2019: This is an 89 year old right-handed man with a history of hypertension, hyperlipidemia, OSA on CPAP, CAD, presenting for evaluation of worsening memory. He feels his memory is okay, he gets confused some, "my wife tells me I forget things." He makes jokes several times during the visit. His wife started noticing changes the beginning of the year. He started having personality changes where he would just explode for no reason. He was started on Lexapro which has significantly helped. She has noticed short term memory issues, and was missing bills that she had to take over in January. He was also starting to miss medications, his wife has started helping. Last night he was very concerned that he had not taken his medications. He has gotten lost driving, last week he could not remember what errand he went to do and was driving around. He could not remember where he would be going, but then figures it out. He repeatedly says he misplaced his hearing aids. His wife was driving yesterday and they brought her car to be serviced, after going to another place, he could not remember that they had brought her car in earlier. He had an unusual episode last week when he came upstairs and told his wife that they had to go and leave now, he was dressed in 2 pairs of boxer shorts and his shirt on backward (unusual, he is usually a fastidious dresser per wife), ready to go somewhere late at night. She found that he had pulled out his clothes and medications and wrapped his medications in his khaki pants. She directed him to bed and he went to sleep. He saw his PCP and had normal  bloodwork and urinalysis. I personally reviewed head CT without contrast done 5/26 which did not show any acute changes, there was diffuse atrophy and chronic microvascular disease. His wife notes he is much more alert in the daytime and more confused at night. He was started on Donepezil 5mg  daily, which he is tolerating without side effects. There is no family history of dementia, no history of significant head injuries. He usually has 1-2 alcohol drinks at night.    He denies any headaches, dizziness (except upon standing quickly), diplopia, dysarthria/dysphagia, neck/back pain, focal numbness/tingling/weakness, bowel dysfunction, anosmia. He has infrequent incontinence. He has had right hand shaking occasionally around 3-4 times a week. He has a history of sleepwalking.  His wife has not noticed any staring/unresponsive episodes. No paranoia or hallucinations. He has trouble sleeping and takes 1/2 tablet of Ambien. He has fallen a couple of times, last fall was 3 weeks ago.   CT head  61/24 No evidence of significant acute traumatic injury to the skull,brain or cervical spine.2. Moderate cerebral and mild cerebellar atrophy with extensive chronic microvascular ischemic changes in the cerebral white matter,similar to the prior study, as above. 3. Severe multilevel degenerative disc disease and cervical spondylosis, status post ACDF at C4-C6, as above.        Current Outpatient Medications on File Prior to Visit  Medication Sig Dispense Refill   acetaminophen (TYLENOL) 325 MG tablet Take 2 tablets (650 mg total) by mouth in the morning and at bedtime. 30 tablet 0   apixaban (ELIQUIS) 5 MG TABS tablet Take 1 tablet (5 mg total) by mouth 2 (two) times daily. 60 tablet 3   camphor-menthol (SARNA) lotion Apply 1 Application topically 2 (two) times daily as needed for itching.     docusate sodium (COLACE) 100 MG capsule Take 1 capsule (100 mg total) by mouth 2 (two) times daily. 10 capsule 0   donepezil  (ARICEPT) 5 MG tablet TAKE ONE TABLET BY MOUTH EVERY MORNING 30 tablet 11   escitalopram (LEXAPRO) 20 MG tablet Take 20 mg by mouth daily.     Evolocumab (REPATHA SURECLICK) 140 MG/ML SOAJ INJECT 1 PEN INTO SKIN EVERY 14 DAYS (Patient taking differently: Inject 140 mg into the skin every 14 (fourteen) days.) 2 mL 11   ezetimibe (ZETIA) 10 MG tablet Take 5 mg by mouth daily.     feeding supplement (ENSURE ENLIVE / ENSURE PLUS) LIQD Take 237 mLs by mouth 3 (three) times daily between meals. (Patient taking differently: Take 237 mLs by mouth 3 (three) times daily as needed (Supplement).) 10000 mL 0   gabapentin (NEURONTIN) 300 MG capsule Take 1 capsule (300 mg total) by mouth 3 (three) times daily. 30 capsule 0   haloperidol (HALDOL) 1 MG tablet Take 1 mg by mouth 2 (two) times daily.     hydrOXYzine (ATARAX) 25 MG tablet Take 25 mg by mouth 2 (two) times daily as needed for itching.     LORazepam (ATIVAN) 0.5 MG tablet Take 0.5 mg by mouth 2 (two) times daily as needed for anxiety.     melatonin 5 MG TABS Take 5 mg by mouth at bedtime.     methocarbamol (ROBAXIN) 500 MG tablet Take 1 tablet (500 mg total) by mouth every 6 (six) hours as needed for muscle spasms. 40 tablet 0   metoprolol tartrate (LOPRESSOR) 25 MG tablet Take 25 mg by mouth 2 (two) times daily. Hold for SBP <100     Multiple Vitamin (MULTIVITAMIN WITH MINERALS) TABS tablet Take 1 tablet by mouth daily. 30 tablet 0   nitroGLYCERIN (NITROSTAT) 0.4 MG SL tablet Place 0.4 mg under the tongue every 5 (five) minutes as needed for chest pain.     omeprazole (PRILOSEC) 40 MG capsule Take 40 mg by mouth in the morning. Take on empty stomach 30 minutes before breakfast.     Oxcarbazepine (TRILEPTAL) 300 MG tablet Take 300 mg by mouth 2 (two) times daily.     oxyCODONE-acetaminophen (PERCOCET) 5-325 MG tablet Take 1 tablet by mouth every 6 (six) hours as needed for severe pain or moderate pain. 30 tablet 0   rosuvastatin (CRESTOR) 5 MG tablet  Take 5 mg by mouth every morning.     [  DISCONTINUED] apixaban (ELIQUIS) 2.5 MG TABS tablet Take 1 tablet (2.5 mg total) by mouth 2 (two) times daily. 60 tablet 1   No current facility-administered medications on file prior to visit.     Observations/Objective:   There were no vitals filed for this visit. GEN:  The patient appears stated age and is in NAD.  Neurological examination: Patient is awake, alert, not oriented x 3. No aphasia or dysarthria. Intact fluency but decreased comprehension. Remote and recent memory impaired . Unable to name and repeat. Cranial nerves: Extraocular movements intact with no nystagmus. No facial asymmetry. Motor: moves all extremities symmetrically, at least anti-gravity x 4.Mild  incoordination on finger to nose testing. Gait not tested, patient on wheelchair    Follow Up Instructions:    -I discussed the assessment and treatment plan with the patient. The patient was provided an opportunity to ask questions and all were answered. The patient agreed with the plan and demonstrated an understanding of the instructions.   The patient was advised to call back or seek an in-person evaluation if the symptoms worsen or if the condition fails to improve as anticipated.    Total time spent on today's visit was 37 minutes, including both face-to-face time and nonface-to-face time.  Time included that spent on review of records (prior notes available to me/labs/imaging if pertinent), discussing treatment and goals, answering patient's questions and coordinating care.   Marlowe Kays, PA-C

## 2023-05-03 NOTE — Patient Instructions (Signed)
Follow up Dec 17   11:30

## 2023-05-04 DIAGNOSIS — S32411D Displaced fracture of anterior wall of right acetabulum, subsequent encounter for fracture with routine healing: Secondary | ICD-10-CM | POA: Diagnosis not present

## 2023-05-04 DIAGNOSIS — M6389 Disorders of muscle in diseases classified elsewhere, multiple sites: Secondary | ICD-10-CM | POA: Diagnosis not present

## 2023-05-04 DIAGNOSIS — S32591D Other specified fracture of right pubis, subsequent encounter for fracture with routine healing: Secondary | ICD-10-CM | POA: Diagnosis not present

## 2023-05-04 DIAGNOSIS — R278 Other lack of coordination: Secondary | ICD-10-CM | POA: Diagnosis not present

## 2023-05-04 DIAGNOSIS — M79651 Pain in right thigh: Secondary | ICD-10-CM | POA: Diagnosis not present

## 2023-05-04 DIAGNOSIS — R4189 Other symptoms and signs involving cognitive functions and awareness: Secondary | ICD-10-CM | POA: Diagnosis not present

## 2023-05-04 DIAGNOSIS — F4321 Adjustment disorder with depressed mood: Secondary | ICD-10-CM | POA: Diagnosis not present

## 2023-05-05 DIAGNOSIS — S32591D Other specified fracture of right pubis, subsequent encounter for fracture with routine healing: Secondary | ICD-10-CM | POA: Diagnosis not present

## 2023-05-05 DIAGNOSIS — R4189 Other symptoms and signs involving cognitive functions and awareness: Secondary | ICD-10-CM | POA: Diagnosis not present

## 2023-05-05 DIAGNOSIS — M6389 Disorders of muscle in diseases classified elsewhere, multiple sites: Secondary | ICD-10-CM | POA: Diagnosis not present

## 2023-05-05 DIAGNOSIS — R278 Other lack of coordination: Secondary | ICD-10-CM | POA: Diagnosis not present

## 2023-05-05 DIAGNOSIS — S32411D Displaced fracture of anterior wall of right acetabulum, subsequent encounter for fracture with routine healing: Secondary | ICD-10-CM | POA: Diagnosis not present

## 2023-05-05 DIAGNOSIS — M79651 Pain in right thigh: Secondary | ICD-10-CM | POA: Diagnosis not present

## 2023-05-05 DIAGNOSIS — F4321 Adjustment disorder with depressed mood: Secondary | ICD-10-CM | POA: Diagnosis not present

## 2023-05-06 DIAGNOSIS — M6389 Disorders of muscle in diseases classified elsewhere, multiple sites: Secondary | ICD-10-CM | POA: Diagnosis not present

## 2023-05-06 DIAGNOSIS — S32411D Displaced fracture of anterior wall of right acetabulum, subsequent encounter for fracture with routine healing: Secondary | ICD-10-CM | POA: Diagnosis not present

## 2023-05-06 DIAGNOSIS — R278 Other lack of coordination: Secondary | ICD-10-CM | POA: Diagnosis not present

## 2023-05-06 DIAGNOSIS — F4321 Adjustment disorder with depressed mood: Secondary | ICD-10-CM | POA: Diagnosis not present

## 2023-05-06 DIAGNOSIS — R4189 Other symptoms and signs involving cognitive functions and awareness: Secondary | ICD-10-CM | POA: Diagnosis not present

## 2023-05-06 DIAGNOSIS — M79651 Pain in right thigh: Secondary | ICD-10-CM | POA: Diagnosis not present

## 2023-05-06 DIAGNOSIS — S32591D Other specified fracture of right pubis, subsequent encounter for fracture with routine healing: Secondary | ICD-10-CM | POA: Diagnosis not present

## 2023-05-08 DIAGNOSIS — R4189 Other symptoms and signs involving cognitive functions and awareness: Secondary | ICD-10-CM | POA: Diagnosis not present

## 2023-05-08 DIAGNOSIS — S32591D Other specified fracture of right pubis, subsequent encounter for fracture with routine healing: Secondary | ICD-10-CM | POA: Diagnosis not present

## 2023-05-08 DIAGNOSIS — S32411D Displaced fracture of anterior wall of right acetabulum, subsequent encounter for fracture with routine healing: Secondary | ICD-10-CM | POA: Diagnosis not present

## 2023-05-08 DIAGNOSIS — F4321 Adjustment disorder with depressed mood: Secondary | ICD-10-CM | POA: Diagnosis not present

## 2023-05-08 DIAGNOSIS — M79651 Pain in right thigh: Secondary | ICD-10-CM | POA: Diagnosis not present

## 2023-05-08 DIAGNOSIS — R278 Other lack of coordination: Secondary | ICD-10-CM | POA: Diagnosis not present

## 2023-05-08 DIAGNOSIS — M6389 Disorders of muscle in diseases classified elsewhere, multiple sites: Secondary | ICD-10-CM | POA: Diagnosis not present

## 2023-05-09 ENCOUNTER — Non-Acute Institutional Stay (SKILLED_NURSING_FACILITY): Payer: Medicare Other | Admitting: Adult Health

## 2023-05-09 ENCOUNTER — Encounter: Payer: Self-pay | Admitting: Adult Health

## 2023-05-09 DIAGNOSIS — R5383 Other fatigue: Secondary | ICD-10-CM | POA: Diagnosis not present

## 2023-05-09 DIAGNOSIS — F02B3 Dementia in other diseases classified elsewhere, moderate, with mood disturbance: Secondary | ICD-10-CM

## 2023-05-09 DIAGNOSIS — G308 Other Alzheimer's disease: Secondary | ICD-10-CM

## 2023-05-09 LAB — BASIC METABOLIC PANEL
BUN: 15 (ref 4–21)
CO2: 21 (ref 13–22)
Chloride: 96 — AB (ref 99–108)
Creatinine: 0.8 (ref 0.6–1.3)
Potassium: 4.2 mEq/L (ref 3.5–5.1)
Sodium: 128 — AB (ref 137–147)

## 2023-05-09 LAB — CBC AND DIFFERENTIAL
HCT: 37 — AB (ref 41–53)
Hemoglobin: 12.2 — AB (ref 13.5–17.5)
Platelets: 350 10*3/uL (ref 150–400)
WBC: 6.5

## 2023-05-09 LAB — HEPATIC FUNCTION PANEL: Bilirubin, Total: 0.3

## 2023-05-09 LAB — COMPREHENSIVE METABOLIC PANEL: Albumin: 3.7 (ref 3.5–5.0)

## 2023-05-09 LAB — CBC: RBC: 3.77 — AB (ref 3.87–5.11)

## 2023-05-09 NOTE — Progress Notes (Signed)
Location:  [facility] Room Number: 306A Place of Service:  SNF [phone]) Provider:  Tamsen Roers, MD  Patient Care Team: [name], MD as PCP - General (Internal Medicine) Quintella Reichert, MD as PCP - Cardiology (Cardiology) Hillis Range, MD (Inactive) as PCP - Electrophysiology (Cardiology) Hilarie Fredrickson, MD (Gastroenterology) Hillis Range, MD (Inactive) (Cardiology) Donnetta Hail, MD as Consulting Physician (Rheumatology) Berneice Heinrich Delbert Phenix., MD as Consulting Physician (Urology) Antony Contras, MD as Consulting Physician (Ophthalmology) Dannielle Burn (Dentistry) Eileen Stanford, MD as Referring Physician (Allergy and Immunology) Van Clines, MD as Consulting Physician (Neurology) Kathyrn Sheriff, [facility] (Inactive) as Pharmacist (Pharmacist) Van Clines, MD as Consulting Physician (Neurology)  Extended Emergency Contact Information Primary Emergency Contact: Bober,[name] Address: [address]          [place], [place] Home Phone: [phone] Work Phone: [phone] Mobile Phone: [phone] Relation: Spouse Secondary Emergency Contact: [name],[name] Mobile Phone: [phone] Relation: Daughter  Code Status: DNR Goals of care: Advanced Directive information    05/09/2023    9:45 AM  Advanced Directives  Does Patient Have a Medical Advance Directive? Yes  Type of Estate agent of [place];Living will;Out of facility DNR (pink MOST or yellow form)  Does patient want to make changes to medical advance directive? No - Patient declined  Copy of Healthcare Power of Attorney in Chart? Yes - validated most recent copy scanned in chart (See row information)  Pre-existing out of facility DNR order (yellow form or pink MOST form) Yellow form placed in chart (order not valid for inpatient use)     Chief Complaint  Patient presents with   Acute Visit     Patient is being seen for a acute visit for a pale color   Quality Metric Gaps    Discussed the need for AWV    HPI:  Pt is a 89 y.o. male seen today for an acute visit for pale color and lethargy.   Nurse reports [name] received his meds this morning in the memory care unit and then during breakfast went to sleep all of sudden and could not be aroused. She was concerned for CVA. No focal deficit was noted vitals were unremarkable. When I arrived he was asleep in his chair intermittently able to f/c.  No acute distress. No obvious focal deficit.   Background: Had ORIF of right hip due to fall with fracture on 03/29/23.  He has had a number of falls most recently 6/1 fell and hit his head required sutures to the right eyebrow. He has had issues with aggressive behaviors, inappropriate comments. Seen by psych and trileptal increased 04/29/23.  Also on haldol and neurontin for prior hx of back pain.    Head and neck CT 04/16/23  IMPRESSION: 1. No evidence of significant acute traumatic injury to the skull, brain or cervical spine. 2. Moderate cerebral and mild cerebellar atrophy with extensive chronic microvascular ischemic changes in the cerebral white matter, similar to the prior study, as above.  PMH Afib on eliquis,  hx of CAD with PCI, NSTEMI. GERD, falls  Hx of sleep apnea, no longer using cpap RA on Orencia, follows with Rheumatology HLD on Repatha, zetia, crestor Hx of depression on lexapro.  Dementia with behaviors  Past Medical History:  Diagnosis Date   CAD (coronary artery disease)    a. s/p inferior STEMI 01/08/2014; LHC 01/08/14: total RCA occlusion s/p  3.5x61mm Xience DES distal RCA and 3.5x28 mm DES mid RCA, 60-70% mid LAD stenosis, EF 55%.   Complication of anesthesia    'long to wake up after back surgery " 09/2003   Diverticulosis of colon    GERD (gastroesophageal reflux disease)    History of cellulitis    05-26-2015  LLE   History of colon polyps    1998-  benign/  2008 adenomatous    History of kidney stones    2013   History of squamous cell carcinoma excision    2013;  2015;  06-12-2015 right leg/  02/ and 05/ 2017  left ear and left leg   Hydronephrosis, left    Hyperlipidemia    Hypertension    Migraine with aura    OSA on CPAP 06/19/2015   Moderate OSA with AHI 18/hr  per study 05-20-2015   Osteoarthritis    Paroxysmal atrial fibrillation (HCC) 4/16   chads2vasc score is at least 4   Premature atrial contractions    RA (rheumatoid arthritis) [facility])    rheumatologist-  dr Alben Deeds   Sinusitis, chronic 10/17/2015   Wears glasses    Wears hearing aid    bilateral   Past Surgical History:  Procedure Laterality Date   BACK SURGERY     CARDIOVASCULAR STRESS TEST  11/21/2015   Low risk nuclear study w/ a small diaphragmatic attenuation artifact, no ischemia/  normal LV function and wall motion , ef 63%   CATARACT EXTRACTION W/ INTRAOCULAR LENS  IMPLANT, BILATERAL  2015   COLONOSCOPY  last one 06-09-2011   CORONARY ANGIOPLASTY     CYSTOSCOPY W/ RETROGRADES Left 07/12/2016   Procedure: CYSTOSCOPY WITH RETROGRADE PYELOGRAM LEFT URETERAL STENT;  Surgeon: Bjorn Pippin, MD;  Location: [facility];  Service: Urology;  Laterality: Left;   CYSTOSCOPY WITH RETROGRADE PYELOGRAM, URETEROSCOPY AND STENT PLACEMENT Left 08/11/2016   Procedure: CYSTOSCOPY WITH RETROGRADE PYELOGRAM,  DIAGNOSTIC URETEROSCOPY , STENT EXCHANGE;  Surgeon: Sebastian Ache, MD;  Location: [facility];  Service: Urology;  Laterality: Left;   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   severe fibromatosis palm and fingers   LEFT HEART CATHETERIZATION WITH CORONARY ANGIOGRAM N/A 01/08/2014   Procedure: LEFT HEART CATHETERIZATION WITH CORONARY ANGIOGRAM;  Surgeon: Lennette Bihari, MD;  Location: [facility] CATH LAB;  Service: Cardiovascular;  Laterality:N/A;  total/ subtotal RCA/  mLAD 60-70% w/ mid systolic bridging/  preserved global LVF, ef 55%   MOHS SURGERY  x2  feb  and may 2017   left ear /  left leg  ([facility])   ORIF ACETABULAR FRACTURE Right 03/29/2023   Procedure: OPEN REDUCTION INTERNAL FIXATION (ORIF) ACETABULAR FRACTURE;  Surgeon: Myrene Galas, MD;  Location: [facility] OR;  Service: Orthopedics;  Laterality: Right;   PERCUTANEOUS CORONARY STENT INTERVENTION (PCI-S)  01/08/2014   Procedure: PERCUTANEOUS CORONARY STENT INTERVENTION (PCI-S);  Surgeon: Lennette Bihari, MD;  Location: [facility] CATH LAB;  Service: Cardiovascular;;  DES to mid and distal RCA   POSTERIOR LUMBAR FUSION  10/01/2003   and Laminectomy/ diskectomy  L4 -- S1   ROBOT ASSISTED PYELOPLASTY Left 09/15/2016   Procedure: XI ROBOTIC ASSISTED PYELOPLASTY;  Surgeon: Sebastian Ache, MD;  Location: [facility];  Service: Urology;  Laterality: Left;   TONSILLECTOMY     TRANSTHORACIC ECHOCARDIOGRAM  02/23/2016   mild LVH,  ef 50-55%,  grade 1 diastolic dysfunction/  mild to moderate AV calcification w/ no stenosis or regurg./  trivial MR and TR/  mild PR  Allergies  Allergen Reactions   Methotrexate Other (See Comments)    REACTION: tachycardia   Aricept [Donepezil Hcl] Other (See Comments)    Nightmares. Pt takes this medication per [facility]    Crestor [Rosuvastatin Calcium] Other (See Comments)    Myalgias with 20mg  dose   Lisinopril Other (See Comments)    cough   Namenda [Memantine] Diarrhea and Nausea Only   Atorvastatin Other (See Comments)    Muscle pain, leg cramps   Depakote [Divalproex Sodium] Rash   Pravastatin Sodium Other (See Comments)    REACTION: cramps, fatigue    Outpatient Encounter Medications as of 05/09/2023  Medication Sig   acetaminophen (TYLENOL) 325 MG tablet Take 2 tablets (650 mg total) by mouth in the morning and at bedtime.   apixaban (ELIQUIS) 5 MG TABS tablet Take 1 tablet (5 mg total) by mouth 2 (two) times daily.   camphor-menthol (SARNA) lotion Apply 1 Application topically 2 (two) times daily as needed for itching.   docusate sodium (COLACE) 100 MG capsule Take 1  capsule (100 mg total) by mouth 2 (two) times daily.   donepezil (ARICEPT) 5 MG tablet TAKE ONE TABLET BY MOUTH EVERY MORNING   escitalopram (LEXAPRO) 20 MG tablet Take 20 mg by mouth daily.   Evolocumab (REPATHA SURECLICK) 140 MG/ML SOAJ INJECT 1 PEN INTO SKIN EVERY 14 DAYS (Patient taking differently: Inject 140 mg into the skin every 14 (fourteen) days.)   ezetimibe (ZETIA) 10 MG tablet Take 5 mg by mouth daily.   feeding supplement (ENSURE ENLIVE / ENSURE PLUS) LIQD Take 237 mLs by mouth 3 (three) times daily between meals. (Patient taking differently: Take 237 mLs by mouth 3 (three) times daily as needed (Supplement).)   gabapentin (NEURONTIN) 300 MG capsule Take 1 capsule (300 mg total) by mouth 3 (three) times daily.   haloperidol (HALDOL) 1 MG tablet Take 1 mg by mouth 2 (two) times daily.   hydrOXYzine (ATARAX) 25 MG tablet Take 25 mg by mouth 2 (two) times daily as needed for itching.   LORazepam (ATIVAN) 0.5 MG tablet Take 0.5 mg by mouth 2 (two) times daily as needed for anxiety.   melatonin 5 MG TABS Take 5 mg by mouth at bedtime.   methocarbamol (ROBAXIN) 500 MG tablet Take 1 tablet (500 mg total) by mouth every 6 (six) hours as needed for muscle spasms.   metoprolol tartrate (LOPRESSOR) 25 MG tablet Take 25 mg by mouth 2 (two) times daily. Hold for SBP <100   Multiple Vitamin (MULTIVITAMIN WITH MINERALS) TABS tablet Take 1 tablet by mouth daily.   nitroGLYCERIN (NITROSTAT) 0.4 MG SL tablet Place 0.4 mg under the tongue every 5 (five) minutes as needed for chest pain.   omeprazole (PRILOSEC) 40 MG capsule Take 40 mg by mouth in the morning. Take on empty stomach 30 minutes before breakfast.   oxcarbazepine (TRILEPTAL) 600 MG tablet Take 600 mg by mouth 2 (two) times daily.   oxyCODONE-acetaminophen (PERCOCET) 5-325 MG tablet Take 1 tablet by mouth every 6 (six) hours as needed for severe pain or moderate pain.   rosuvastatin (CRESTOR) 5 MG tablet Take 5 mg by mouth every morning.    [DISCONTINUED] apixaban (ELIQUIS) 2.5 MG TABS tablet Take 1 tablet (2.5 mg total) by mouth 2 (two) times daily.   [DISCONTINUED] Oxcarbazepine (TRILEPTAL) 300 MG tablet Take 300 mg by mouth 2 (two) times daily.   No facility-administered encounter medications on file as of 05/09/2023.    Review of Systems  Constitutional:  Positive for activity change and fatigue. Negative for appetite change, chills, diaphoresis, fever and unexpected weight change.  HENT:  Negative for congestion.   Respiratory:  Negative for cough, shortness of breath, wheezing and stridor.   Cardiovascular:  Negative for chest pain, palpitations and leg swelling.  Gastrointestinal:  Negative for abdominal distention, abdominal pain, constipation and diarrhea.  Genitourinary:  Negative for difficulty urinating and dysuria.  Musculoskeletal:  Positive for arthralgias and gait problem. Negative for back pain, joint swelling and myalgias.  Neurological:  Negative for dizziness, seizures, syncope, facial asymmetry, speech difficulty, weakness and headaches.  Hematological:  Negative for adenopathy. Does not bruise/bleed easily.  Psychiatric/Behavioral:  Positive for agitation, behavioral problems and confusion.     Immunization History  Administered Date(s) Administered   Fluad Quad(high Dose 65+) 07/09/2019, 08/15/2020, 09/16/2021, 07/29/2022   Influenza Split 08/10/2011, 08/15/2012   Influenza Whole 08/21/2009, 09/22/2010   Influenza, High Dose Seasonal PF 09/16/2015, 08/18/2016, 08/31/2017, 08/14/2018   Influenza,inj,Quad PF,6+ Mos 08/06/2013   Influenza-Unspecified 08/31/2017   PFIZER(Purple Top)SARS-COV-2 Vaccination 12/11/2019, 01/01/2020, 08/12/2020   Pfizer Covid-19 Vaccine Bivalent Booster 74yrs & up 03/11/2023   Pneumococcal Conjugate-13 10/02/2013   Pneumococcal Polysaccharide-23 06/04/2010   Tdap 10/22/2011, 09/22/2022   Zoster Recombinat (Shingrix) 03/09/2017, 05/10/2017   Pertinent  Health Maintenance Due   Topic Date Due   INFLUENZA VACCINE  06/16/2023      10/12/2022    9:31 AM 11/01/2022    2:52 PM 11/02/2022    9:04 AM 11/30/2022    9:57 AM 05/03/2023    4:08 PM  Fall Risk  Falls in the past year? 1 1 1 1 1   Was there an injury with Fall? 1 1 1 1  0  Fall Risk Category Calculator 3 3 3 2 2   Fall Risk Category (Retired) [employer]    (RETIRED) Patient Fall Risk Level High fall risk High fall risk High fall risk    Patient at Risk for Falls Due to History of fall(s);Impaired balance/gait;Impaired mobility  History of fall(s);Impaired balance/gait;Impaired mobility No Fall Risks   Fall risk Follow up Falls evaluation completed Falls evaluation completed Falls evaluation completed Falls evaluation completed Falls evaluation completed   Functional Status Survey:    Vitals:   05/09/23 0943  BP: (!) 154/88  Pulse: 62  Resp: 18  Temp: 97.6 F (36.4 C)  TempSrc: Temporal  SpO2: 97%  Weight: 184 lb 9.6 oz (83.7 kg)  Height: 6' (1.829 m)   Body mass index is 25.04 kg/m. Physical Exam Vitals and nursing note reviewed.  Constitutional:      General: He is not in acute distress.    Appearance: He is not diaphoretic.     Comments: Drowsy but arouses and can f/c  HENT:     Head: Normocephalic and atraumatic.     Nose: Nose normal.     Mouth/Throat:     Mouth: Mucous membranes are moist.     Pharynx: Oropharynx is clear.  Neck:     Thyroid: No thyromegaly.     Vascular: No JVD.     Trachea: No tracheal deviation.  Cardiovascular:     Rate and Rhythm: Normal rate and regular rhythm.     Heart sounds: No murmur heard. Pulmonary:     Effort: Pulmonary effort is normal. No respiratory distress.     Breath sounds: Normal breath sounds. No wheezing.  Abdominal:     General: Bowel sounds are normal. There is no distension.  Palpations: Abdomen is soft.     Tenderness: There is no abdominal tenderness.  Lymphadenopathy:     Cervical: No cervical adenopathy.  Skin:     General: Skin is warm and dry.  Neurological:     General: No focal deficit present.     Cranial Nerves: No cranial nerve deficit.     Labs reviewed: Recent Labs    03/29/23 0942 03/29/23 1340 03/30/23 0633 03/31/23 0219 04/05/23 0000 04/16/23 1002  NA 137   < > 137 135 133* 138  K 3.6   < > 4.0 3.5 4.0 3.9  CL 104   < > 104 102 99 105  CO2 25  --  25 23 23* 27  GLUCOSE 112*   < > 148* 112*  --  104*  BUN 19   < > 16 20 19 13   CREATININE 0.91   < > 0.88 0.77 0.8 0.87  CALCIUM 8.7*  --  8.2* 8.3* 9.0 8.7*  MG 2.1  --  2.0 2.1  --   --    < > = values in this interval not displayed.   Recent Labs    10/02/22 0350 10/11/22 0600 12/26/22 1414 01/26/23 0000 03/28/23 0815  AST 24   < > 30 19 17   ALT 16   < > 12 12 16   ALKPHOS 61   < > 85 79 56  BILITOT 0.6  --  1.0  --  0.6  PROT 7.0  --  7.8  --  6.8  ALBUMIN 4.0   < > 3.8 3.8 3.6   < > = values in this interval not displayed.   Recent Labs    07/29/22 1345 09/24/22 0000 12/26/22 1414 03/28/23 0815 03/30/23 0633 03/31/23 0219 04/05/23 0000 04/16/23 1002  WBC 5.3   < > 8.1   < > 7.5 7.5 7.8 8.0  NEUTROABS 2.7  --  6.2  --   --   --   --  5.6  HGB 12.8*   < > 12.7*   < > 9.4* 9.4* 10.1* 10.9*  HCT 38.2*   < > 38.3*   < > 28.1* 27.9* 30* 34.3*  MCV 99.3   < > 97.0   < > 94.9 93.6  --  98.0  PLT 274.0   < > 272   < > 197 221 434* 466*   < > = values in this interval not displayed.   Lab Results  Component Value Date   TSH 0.79 01/26/2023   Lab Results  Component Value Date   HGBA1C 6.4 08/30/2017   Lab Results  Component Value Date   CHOL 126 01/26/2023   HDL 36 01/26/2023   LDLCALC 39 01/26/2023   LDLDIRECT 168.9 05/24/2013   TRIG 254 (A) 01/26/2023   CHOLHDL 4.2 08/04/2020    Significant Diagnostic Results in last 30 days:  CT HEAD WO CONTRAST  Result Date: 04/16/2023 CLINICAL DATA:  89 year old male with history of trauma from a fall. EXAM: CT HEAD WITHOUT CONTRAST CT CERVICAL SPINE WITHOUT  CONTRAST TECHNIQUE: Multidetector CT imaging of the head and cervical spine was performed following the standard protocol without intravenous contrast. Multiplanar CT image reconstructions of the cervical spine were also generated. RADIATION DOSE REDUCTION: This exam was performed according to the departmental dose-optimization program which includes automated exposure control, adjustment of the mA and/or kV according to patient size and/or use of iterative reconstruction technique. COMPARISON:  Head CT 03/28/2023. CT of the cervical spine  10/02/2022. FINDINGS: CT HEAD FINDINGS Brain: Moderate cerebral and mild cerebellar atrophy with ex vacuo dilatation of the ventricular system. Patchy and confluent areas of decreased attenuation are noted throughout the deep and periventricular white matter of the cerebral hemispheres bilaterally, compatible with chronic microvascular ischemic disease. No evidence of acute infarction, hemorrhage, hydrocephalus, extra-axial collection or mass lesion/mass effect. Vascular: No hyperdense vessel or unexpected calcification. Skull: Normal. Negative for fracture or focal lesion. Sinuses/Orbits: No acute finding. Other: None. CT CERVICAL SPINE FINDINGS Alignment: Straightening of normal cervical lordosis, chronic and similar to the prior study. Alignment is otherwise anatomic. Skull base and vertebrae: Postoperative changes of ACDF are noted at C4-C6 with interbody grafts at C4-C5 and C5-C6. No acute displaced fractures are noted. Chronic compression of superior endplate of T1 with 20% loss of anterior vertebral body height, similar to the prior study. Soft tissues and spinal canal: No prevertebral fluid or swelling. No visible canal hematoma. Disc levels: Status post ACDF at C4-C6. Multilevel degenerative disc disease, most severe at C6-C7. Severe multilevel facet arthropathy bilaterally. Upper chest: Unremarkable. Other: None. IMPRESSION: 1. No evidence of significant acute traumatic  injury to the skull, brain or cervical spine. 2. Moderate cerebral and mild cerebellar atrophy with extensive chronic microvascular ischemic changes in the cerebral white matter, similar to the prior study, as above. 3. Severe multilevel degenerative disc disease and cervical spondylosis, status post ACDF at C4-C6, as above. Electronically Signed   By: Trudie Reed M.D.   On: 04/16/2023 10:25   CT CERVICAL SPINE WO CONTRAST  Result Date: 04/16/2023 CLINICAL DATA:  89 year old male with history of trauma from a fall. EXAM: CT HEAD WITHOUT CONTRAST CT CERVICAL SPINE WITHOUT CONTRAST TECHNIQUE: Multidetector CT imaging of the head and cervical spine was performed following the standard protocol without intravenous contrast. Multiplanar CT image reconstructions of the cervical spine were also generated. RADIATION DOSE REDUCTION: This exam was performed according to the departmental dose-optimization program which includes automated exposure control, adjustment of the mA and/or kV according to patient size and/or use of iterative reconstruction technique. COMPARISON:  Head CT 03/28/2023. CT of the cervical spine 10/02/2022. FINDINGS: CT HEAD FINDINGS Brain: Moderate cerebral and mild cerebellar atrophy with ex vacuo dilatation of the ventricular system. Patchy and confluent areas of decreased attenuation are noted throughout the deep and periventricular white matter of the cerebral hemispheres bilaterally, compatible with chronic microvascular ischemic disease. No evidence of acute infarction, hemorrhage, hydrocephalus, extra-axial collection or mass lesion/mass effect. Vascular: No hyperdense vessel or unexpected calcification. Skull: Normal. Negative for fracture or focal lesion. Sinuses/Orbits: No acute finding. Other: None. CT CERVICAL SPINE FINDINGS Alignment: Straightening of normal cervical lordosis, chronic and similar to the prior study. Alignment is otherwise anatomic. Skull base and vertebrae:  Postoperative changes of ACDF are noted at C4-C6 with interbody grafts at C4-C5 and C5-C6. No acute displaced fractures are noted. Chronic compression of superior endplate of T1 with 20% loss of anterior vertebral body height, similar to the prior study. Soft tissues and spinal canal: No prevertebral fluid or swelling. No visible canal hematoma. Disc levels: Status post ACDF at C4-C6. Multilevel degenerative disc disease, most severe at C6-C7. Severe multilevel facet arthropathy bilaterally. Upper chest: Unremarkable. Other: None. IMPRESSION: 1. No evidence of significant acute traumatic injury to the skull, brain or cervical spine. 2. Moderate cerebral and mild cerebellar atrophy with extensive chronic microvascular ischemic changes in the cerebral white matter, similar to the prior study, as above. 3. Severe multilevel degenerative disc disease and  cervical spondylosis, status post ACDF at C4-C6, as above. Electronically Signed   By: Trudie Reed M.D.   On: 04/16/2023 10:25   DG Shoulder Right Port  Result Date: 04/16/2023 CLINICAL DATA:  89 year old male with history of trauma from a fall complaining of right shoulder pain. EXAM: RIGHT SHOULDER - 1 VIEW COMPARISON:  No priors. FINDINGS: There is no evidence of fracture or dislocation. Degenerative changes of osteoarthritis are noted in the glenohumeral and acromioclavicular joints. Soft tissues are unremarkable. IMPRESSION: 1. No acute radiographic abnormality of the right shoulder. Electronically Signed   By: Trudie Reed M.D.   On: 04/16/2023 10:19   DG Elbow Complete Right  Result Date: 04/16/2023 CLINICAL DATA:  89 year old male with history of trauma from a fall complaining of right elbow obtained. EXAM: RIGHT ELBOW - COMPLETE 3+ VIEW COMPARISON:  No priors. FINDINGS: There is no evidence of fracture, dislocation, or joint effusion. There is no evidence of arthropathy or other focal bone abnormality. Soft tissues are unremarkable. IMPRESSION:  Negative. Electronically Signed   By: Trudie Reed M.D.   On: 04/16/2023 10:15   DG Chest Port 1 View  Result Date: 04/16/2023 CLINICAL DATA:  89 year old male with history of trauma from a fall. EXAM: PORTABLE CHEST 1 VIEW COMPARISON:  Chest x-ray 03/28/2023. FINDINGS: Lung volumes are normal. No consolidative airspace disease. No pleural effusions. No pneumothorax. No pulmonary nodule or mass noted. Pulmonary vasculature and the cardiomediastinal silhouette are within normal limits. Atherosclerosis in the thoracic aorta. Multiple old healed posterior left-sided rib fractures are again noted. Orthopedic fixation hardware in the lower cervical spine incidentally noted. IMPRESSION: 1.  No radiographic evidence of acute cardiopulmonary disease. 2. Aortic atherosclerosis. Electronically Signed   By: Trudie Reed M.D.   On: 04/16/2023 10:15   DG Pelvis Portable  Result Date: 04/16/2023 CLINICAL DATA:  89 year old male with history of trauma from a fall. EXAM: PORTABLE PELVIS 1-2 VIEWS COMPARISON:  03/29/2023. FINDINGS: Plate and screw fixation device in the right hemipelvis traversing an old healed right acetabular fracture, similar to the prior study. No definite acute displaced fracture of the bony pelvic ring or either proximal femur as visualized. Bilateral femoral heads project over the acetabuli on this single view examination. Degenerative changes of moderate osteoarthritis are noted in both hip joints. Atherosclerotic calcifications. IMPRESSION: 1. No acute radiographic abnormality of the bony pelvis on this single view examination. Status post ORIF in the right hemipelvis for old right acetabular fracture. 2. Atherosclerosis. Electronically Signed   By: Trudie Reed M.D.   On: 04/16/2023 10:14    Assessment/Plan  1. Lethargy Improving, no focal deficit and able to f/c Symptoms occurred after morning meds Discussed with Dr. Chales Abrahams, will hold haldol and and neurontin Discussed with DON,  will confer with Dr Donell Beers today  Checking labs to rule out anemia, dehydration.   2. 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  Fall risk  Will discuss in rehab meeting regarding goals of care.  If he progresses to ambulation again he will continue to be a fall risk and concern for injury to self and others due to dementia with behaviors.      Labs/tests ordered:  CBC CMP trileptal level stat

## 2023-05-10 ENCOUNTER — Encounter: Payer: Self-pay | Admitting: Orthopedic Surgery

## 2023-05-10 ENCOUNTER — Non-Acute Institutional Stay: Payer: Medicare Other | Admitting: Orthopedic Surgery

## 2023-05-10 ENCOUNTER — Encounter: Payer: Self-pay | Admitting: Adult Health

## 2023-05-10 DIAGNOSIS — G308 Other Alzheimer's disease: Secondary | ICD-10-CM

## 2023-05-10 DIAGNOSIS — F4321 Adjustment disorder with depressed mood: Secondary | ICD-10-CM | POA: Diagnosis not present

## 2023-05-10 DIAGNOSIS — R5383 Other fatigue: Secondary | ICD-10-CM | POA: Diagnosis not present

## 2023-05-10 DIAGNOSIS — Z79899 Other long term (current) drug therapy: Secondary | ICD-10-CM | POA: Diagnosis not present

## 2023-05-10 DIAGNOSIS — E871 Hypo-osmolality and hyponatremia: Secondary | ICD-10-CM | POA: Diagnosis not present

## 2023-05-10 DIAGNOSIS — R4189 Other symptoms and signs involving cognitive functions and awareness: Secondary | ICD-10-CM | POA: Diagnosis not present

## 2023-05-10 DIAGNOSIS — F02B3 Dementia in other diseases classified elsewhere, moderate, with mood disturbance: Secondary | ICD-10-CM | POA: Diagnosis not present

## 2023-05-10 DIAGNOSIS — M79651 Pain in right thigh: Secondary | ICD-10-CM | POA: Diagnosis not present

## 2023-05-10 DIAGNOSIS — S32411D Displaced fracture of anterior wall of right acetabulum, subsequent encounter for fracture with routine healing: Secondary | ICD-10-CM | POA: Diagnosis not present

## 2023-05-10 DIAGNOSIS — R278 Other lack of coordination: Secondary | ICD-10-CM | POA: Diagnosis not present

## 2023-05-10 DIAGNOSIS — M6389 Disorders of muscle in diseases classified elsewhere, multiple sites: Secondary | ICD-10-CM | POA: Diagnosis not present

## 2023-05-10 DIAGNOSIS — S32591D Other specified fracture of right pubis, subsequent encounter for fracture with routine healing: Secondary | ICD-10-CM | POA: Diagnosis not present

## 2023-05-10 NOTE — Progress Notes (Signed)
[facility] Room Number: 306-A Place of Service:  ALF [phone]) Provider:  Hazle Nordmann, NP  PCP: [name], MD  Patient Care Team: [name], MD as PCP - General (Internal Medicine) Quintella Reichert, MD as PCP - Cardiology (Cardiology) Hillis Range, MD (Inactive) as PCP - Electrophysiology (Cardiology) Hilarie Fredrickson, MD (Gastroenterology) Hillis Range, MD (Inactive) (Cardiology) Donnetta Hail, MD as Consulting Physician (Rheumatology) Berneice Heinrich Delbert Phenix., MD as Consulting Physician (Urology) Antony Contras, MD as Consulting Physician (Ophthalmology) Dannielle Burn (Dentistry) Eileen Stanford, MD as Referring Physician (Allergy and Immunology) Van Clines, MD as Consulting Physician (Neurology) Kathyrn Sheriff, [facility] (Inactive) as Pharmacist (Pharmacist) Van Clines, MD as Consulting Physician (Neurology)  Extended Emergency Contact Information Primary Emergency Contact: Zarling,[name] Address: [address]          [place], [place] Home Phone: [phone] Work Phone: [phone] Mobile Phone: [phone] Relation: Spouse Secondary Emergency Contact: [name],[name] Mobile Phone: [phone] Relation: Daughter  Code Status:  DNR Goals of care: Advanced Directive information    05/10/2023    2:19 PM  Advanced Directives  Does Patient Have a Medical Advance Directive? Yes  Type of Estate agent of [place];Living will;Out of facility DNR (pink MOST or yellow form)  Does patient want to make changes to medical advance directive? No - Patient declined  Copy of Healthcare Power of Attorney in Chart? Yes - validated most recent copy scanned in chart (See row information)     Chief Complaint  Patient presents with   Acute Visit    Hyponatremia     HPI:  Pt is a 89 y.o. male seen today for an acute visit for hyponatremia.   He currently resides on the  memory care unit due to Alzheimer's dementia. PMH: CAD s/p STEMI 2015, HTN, HLD, PAF, GERD, frequent falls s/p subdural hematoma 09/2022, left hydronephrosis, RA, migraine, OSA, OA, depression and insomnia.   06/24 he was seen for lethargy. He was not easily aroused for a brief period of time. Haldol, ativan and gabapentin were held. Stat labs revealed Na+ 128. Today, he is alert and at baseline. He was able to engage in conversation with me, did not follow all commands. Afebrile. Vitals stable.    Past Medical History:  Diagnosis Date   CAD (coronary artery disease)    a. s/p inferior STEMI 01/08/2014; LHC 01/08/14: total RCA occlusion s/p 3.5x83mm Xience DES distal RCA and 3.5x28 mm DES mid RCA, 60-70% mid LAD stenosis, EF 55%.   Complication of anesthesia    'long to wake up after back surgery " 09/2003   Diverticulosis of colon    GERD (gastroesophageal reflux disease)    History of cellulitis    05-26-2015  LLE   History of colon polyps    1998- benign/  2008 adenomatous    History of kidney stones    2013   History of squamous cell carcinoma excision    2013;  2015;  06-12-2015 right leg/  02/ and 05/ 2017  left ear and left leg   Hydronephrosis, left    Hyperlipidemia    Hypertension    Migraine with aura    OSA on CPAP 06/19/2015   Moderate OSA with AHI 18/hr  per study 05-20-2015   Osteoarthritis    Paroxysmal atrial fibrillation (HCC) 4/16   chads2vasc score is at least 4   Premature atrial contractions  RA (rheumatoid arthritis) [facility])    rheumatologist-  dr Alben Deeds   Sinusitis, chronic 10/17/2015   Wears glasses    Wears hearing aid    bilateral   Past Surgical History:  Procedure Laterality Date   BACK SURGERY     CARDIOVASCULAR STRESS TEST  11/21/2015   Low risk nuclear study w/ a small diaphragmatic attenuation artifact, no ischemia/  normal LV function and wall motion , ef 63%   CATARACT EXTRACTION W/ INTRAOCULAR LENS  IMPLANT, BILATERAL  2015    COLONOSCOPY  last one 06-09-2011   CORONARY ANGIOPLASTY     CYSTOSCOPY W/ RETROGRADES Left 07/12/2016   Procedure: CYSTOSCOPY WITH RETROGRADE PYELOGRAM LEFT URETERAL STENT;  Surgeon: Bjorn Pippin, MD;  Location: [facility];  Service: Urology;  Laterality: Left;   CYSTOSCOPY WITH RETROGRADE PYELOGRAM, URETEROSCOPY AND STENT PLACEMENT Left 08/11/2016   Procedure: CYSTOSCOPY WITH RETROGRADE PYELOGRAM,  DIAGNOSTIC URETEROSCOPY , STENT EXCHANGE;  Surgeon: Sebastian Ache, MD;  Location: [facility];  Service: Urology;  Laterality: Left;   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   severe fibromatosis palm and fingers   LEFT HEART CATHETERIZATION WITH CORONARY ANGIOGRAM N/A 01/08/2014   Procedure: LEFT HEART CATHETERIZATION WITH CORONARY ANGIOGRAM;  Surgeon: Lennette Bihari, MD;  Location: [facility] CATH LAB;  Service: Cardiovascular;  Laterality:N/A;  total/ subtotal RCA/  mLAD 60-70% w/ mid systolic bridging/  preserved global LVF, ef 55%   MOHS SURGERY  x2  feb and may 2017   left ear /  left leg  (SCC)   ORIF ACETABULAR FRACTURE Right 03/29/2023   Procedure: OPEN REDUCTION INTERNAL FIXATION (ORIF) ACETABULAR FRACTURE;  Surgeon: Myrene Galas, MD;  Location: [facility];  Service: Orthopedics;  Laterality: Right;   PERCUTANEOUS CORONARY STENT INTERVENTION (PCI-S)  01/08/2014   Procedure: PERCUTANEOUS CORONARY STENT INTERVENTION (PCI-S);  Surgeon: Lennette Bihari, MD;  Location: [facility] CATH LAB;  Service: Cardiovascular;;  DES to mid and distal RCA   POSTERIOR LUMBAR FUSION  10/01/2003   and Laminectomy/ diskectomy  L4 -- S1   ROBOT ASSISTED PYELOPLASTY Left 09/15/2016   Procedure: XI ROBOTIC ASSISTED PYELOPLASTY;  Surgeon: Sebastian Ache, MD;  Location: [facility];  Service: Urology;  Laterality: Left;   TONSILLECTOMY     TRANSTHORACIC ECHOCARDIOGRAM  02/23/2016   mild LVH,  ef 50-55%,  grade 1 diastolic dysfunction/  mild to moderate AV calcification w/ no stenosis or regurg./  trivial MR and TR/  mild PR     Allergies  Allergen Reactions   Methotrexate Other (See Comments)    REACTION: tachycardia   Aricept [Donepezil Hcl] Other (See Comments)    Nightmares. Pt takes this medication per [facility]    Crestor [Rosuvastatin Calcium] Other (See Comments)    Myalgias with 20mg  dose   Lisinopril Other (See Comments)    cough   Namenda [Memantine] Diarrhea and Nausea Only   Atorvastatin Other (See Comments)    Muscle pain, leg cramps   Depakote [Divalproex Sodium] Rash   Pravastatin Sodium Other (See Comments)    REACTION: cramps, fatigue    Allergies as of 05/10/2023       Reactions   Methotrexate Other (See Comments)   REACTION: tachycardia   Aricept [donepezil Hcl] Other (See Comments)   Nightmares. Pt takes this medication per [facility]    Crestor [rosuvastatin Calcium] Other (See Comments)   Myalgias with 20mg  dose   Lisinopril Other (See Comments)   cough   Namenda [memantine] Diarrhea, Nausea Only   Atorvastatin Other (  See Comments)   Muscle pain, leg cramps   Depakote [divalproex Sodium] Rash   Pravastatin Sodium Other (See Comments)   REACTION: cramps, fatigue        Medication List        Accurate as of May 10, 2023  2:21 PM. If you have any questions, ask your nurse or doctor.          acetaminophen 325 MG tablet Commonly known as: Tylenol Take 2 tablets (650 mg total) by mouth in the morning and at bedtime.   apixaban 5 MG Tabs tablet Commonly known as: Eliquis Take 1 tablet (5 mg total) by mouth 2 (two) times daily.   camphor-menthol lotion Commonly known as: SARNA Apply 1 Application topically 2 (two) times daily as needed for itching.   docusate sodium 100 MG capsule Commonly known as: COLACE Take 1 capsule (100 mg total) by mouth 2 (two) times daily.   donepezil 5 MG tablet Commonly known as: ARICEPT TAKE ONE TABLET BY MOUTH EVERY MORNING   escitalopram 20 MG tablet Commonly known as: LEXAPRO Take 20 mg by mouth daily.   ezetimibe 10 MG  tablet Commonly known as: ZETIA Take 5 mg by mouth daily.   feeding supplement Liqd Take 237 mLs by mouth 3 (three) times daily between meals.   gabapentin 300 MG capsule Commonly known as: NEURONTIN Take 1 capsule (300 mg total) by mouth 3 (three) times daily.   haloperidol 1 MG tablet Commonly known as: HALDOL Take 1 mg by mouth at bedtime.   hydrOXYzine 25 MG tablet Commonly known as: ATARAX Take 25 mg by mouth 2 (two) times daily as needed for itching.   LORazepam 0.5 MG tablet Commonly known as: ATIVAN Take 0.5 mg by mouth 2 (two) times daily as needed for anxiety.   melatonin 5 MG Tabs Take 5 mg by mouth at bedtime.   methocarbamol 500 MG tablet Commonly known as: ROBAXIN Take 1 tablet (500 mg total) by mouth every 6 (six) hours as needed for muscle spasms.   metoprolol tartrate 25 MG tablet Commonly known as: LOPRESSOR Take 25 mg by mouth 2 (two) times daily. Hold for SBP <100   multivitamin with minerals Tabs tablet Take 1 tablet by mouth daily.   nitroGLYCERIN 0.4 MG SL tablet Commonly known as: NITROSTAT Place 0.4 mg under the tongue every 5 (five) minutes as needed for chest pain.   omeprazole 40 MG capsule Commonly known as: PRILOSEC Take 40 mg by mouth in the morning. Take on empty stomach 30 minutes before breakfast.   oxcarbazepine 600 MG tablet Commonly known as: TRILEPTAL Take 600 mg by mouth 2 (two) times daily.   oxyCODONE-acetaminophen 5-325 MG tablet Commonly known as: Percocet Take 1 tablet by mouth every 6 (six) hours as needed for severe pain or moderate pain.   Repatha SureClick 140 MG/ML Soaj Generic drug: Evolocumab INJECT 1 PEN INTO SKIN EVERY 14 DAYS   rosuvastatin 5 MG tablet Commonly known as: CRESTOR Take 5 mg by mouth every morning.        Review of Systems  Unable to perform ROS: Dementia    Immunization History  Administered Date(s) Administered   Fluad Quad(high Dose 65+) 07/09/2019, 08/15/2020, 09/16/2021,  07/29/2022   Influenza Split 08/10/2011, 08/15/2012   Influenza Whole 08/21/2009, 09/22/2010   Influenza, High Dose Seasonal PF 09/16/2015, 08/18/2016, 08/31/2017, 08/14/2018   Influenza,inj,Quad PF,6+ Mos 08/06/2013   Influenza-Unspecified 08/31/2017   PFIZER(Purple Top)SARS-COV-2 Vaccination 12/11/2019, 01/01/2020, 08/12/2020   Pfizer Covid-19  Vaccine Bivalent Booster 23yrs & up 03/11/2023   Pneumococcal Conjugate-13 10/02/2013   Pneumococcal Polysaccharide-23 06/04/2010   Tdap 10/22/2011, 09/22/2022   Zoster Recombinat (Shingrix) 03/09/2017, 05/10/2017   Pertinent  Health Maintenance Due  Topic Date Due   INFLUENZA VACCINE  06/16/2023      10/12/2022    9:31 AM 11/01/2022    2:52 PM 11/02/2022    9:04 AM 11/30/2022    9:57 AM 05/03/2023    4:08 PM  Fall Risk  Falls in the past year? 1 1 1 1 1   Was there an injury with Fall? 1 1 1 1  0  Fall Risk Category Calculator 3 3 3 2 2   Fall Risk Category (Retired) [employer]    (RETIRED) Patient Fall Risk Level High fall risk High fall risk High fall risk    Patient at Risk for Falls Due to History of fall(s);Impaired balance/gait;Impaired mobility  History of fall(s);Impaired balance/gait;Impaired mobility No Fall Risks   Fall risk Follow up Falls evaluation completed Falls evaluation completed Falls evaluation completed Falls evaluation completed Falls evaluation completed   Functional Status Survey:    Vitals:   05/10/23 1411  BP: (!) 152/80  Pulse: 62  Resp: 18  Temp: (!) 97.4 F (36.3 C)  SpO2: 97%  Weight: 184 lb 9.6 oz (83.7 kg)  Height: 6' (1.829 m)   Body mass index is 25.04 kg/m. Physical Exam Vitals reviewed.  Constitutional:      General: He is not in acute distress. HENT:     Head: Normocephalic.  Eyes:     General:        Right eye: No discharge.        Left eye: No discharge.     Pupils: Pupils are equal, round, and reactive to light.  Cardiovascular:     Rate and Rhythm: Normal rate and regular  rhythm.     Pulses: Normal pulses.     Heart sounds: Normal heart sounds.  Pulmonary:     Effort: Pulmonary effort is normal. No respiratory distress.     Breath sounds: Normal breath sounds. No wheezing.  Abdominal:     General: Bowel sounds are normal.     Palpations: Abdomen is soft.  Musculoskeletal:     Cervical back: Neck supple.     Right lower leg: No edema.     Left lower leg: No edema.  Skin:    General: Skin is warm.     Capillary Refill: Capillary refill takes less than 2 seconds.  Neurological:     General: No focal deficit present.     Mental Status: He is alert. Mental status is at baseline.     Motor: Weakness present.     Gait: Gait abnormal.  Psychiatric:        Mood and Affect: Mood normal.     Comments: Alert to self/familiar face, does not follow commands, mild aphasia     Labs reviewed: Recent Labs    03/29/23 0942 03/29/23 1340 03/30/23 0633 03/31/23 0219 04/05/23 0000 04/16/23 1002 05/09/23 0000  NA 137   < > 137 135 133* 138 128*  K 3.6   < > 4.0 3.5 4.0 3.9 4.2  CL 104   < > 104 102 99 105 96*  CO2 25  --  25 23 23* 27 21  GLUCOSE 112*   < > 148* 112*  --  104*  --   BUN 19   < > 16 20 19  13  15  CREATININE 0.91   < > 0.88 0.77 0.8 0.87 0.8  CALCIUM 8.7*  --  8.2* 8.3* 9.0 8.7*  --   MG 2.1  --  2.0 2.1  --   --   --    < > = values in this interval not displayed.   Recent Labs    10/02/22 0350 10/11/22 0600 12/26/22 1414 01/26/23 0000 03/28/23 0815 05/09/23 0000  AST 24   < > 30 19 17   --   ALT 16   < > 12 12 16   --   ALKPHOS 61   < > 85 79 56  --   BILITOT 0.6  --  1.0  --  0.6  --   PROT 7.0  --  7.8  --  6.8  --   ALBUMIN 4.0   < > 3.8 3.8 3.6 3.7   < > = values in this interval not displayed.   Recent Labs    07/29/22 1345 09/24/22 0000 12/26/22 1414 03/28/23 0815 03/30/23 1607 03/31/23 0219 04/05/23 0000 04/16/23 1002 05/09/23 0000  WBC 5.3   < > 8.1   < > 7.5 7.5 7.8 8.0 6.5  NEUTROABS 2.7  --  6.2  --   --    --   --  5.6  --   HGB 12.8*   < > 12.7*   < > 9.4* 9.4* 10.1* 10.9* 12.2*  HCT 38.2*   < > 38.3*   < > 28.1* 27.9* 30* 34.3* 37*  MCV 99.3   < > 97.0   < > 94.9 93.6  --  98.0  --   PLT 274.0   < > 272   < > 197 221 434* 466* 350   < > = values in this interval not displayed.   Lab Results  Component Value Date   TSH 0.79 01/26/2023   Lab Results  Component Value Date   HGBA1C 6.4 08/30/2017   Lab Results  Component Value Date   CHOL 126 01/26/2023   HDL 36 01/26/2023   LDLCALC 39 01/26/2023   LDLDIRECT 168.9 05/24/2013   TRIG 254 (A) 01/26/2023   CHOLHDL 4.2 08/04/2020    Significant Diagnostic Results in last 30 days:  CT HEAD WO CONTRAST  Result Date: 04/16/2023 CLINICAL DATA:  89 year old male with history of trauma from a fall. EXAM: CT HEAD WITHOUT CONTRAST CT CERVICAL SPINE WITHOUT CONTRAST TECHNIQUE: Multidetector CT imaging of the head and cervical spine was performed following the standard protocol without intravenous contrast. Multiplanar CT image reconstructions of the cervical spine were also generated. RADIATION DOSE REDUCTION: This exam was performed according to the departmental dose-optimization program which includes automated exposure control, adjustment of the mA and/or kV according to patient size and/or use of iterative reconstruction technique. COMPARISON:  Head CT 03/28/2023. CT of the cervical spine 10/02/2022. FINDINGS: CT HEAD FINDINGS Brain: Moderate cerebral and mild cerebellar atrophy with ex vacuo dilatation of the ventricular system. Patchy and confluent areas of decreased attenuation are noted throughout the deep and periventricular white matter of the cerebral hemispheres bilaterally, compatible with chronic microvascular ischemic disease. No evidence of acute infarction, hemorrhage, hydrocephalus, extra-axial collection or mass lesion/mass effect. Vascular: No hyperdense vessel or unexpected calcification. Skull: Normal. Negative for fracture or focal  lesion. Sinuses/Orbits: No acute finding. Other: None. CT CERVICAL SPINE FINDINGS Alignment: Straightening of normal cervical lordosis, chronic and similar to the prior study. Alignment is otherwise anatomic. Skull base and vertebrae: Postoperative changes  of ACDF are noted at C4-C6 with interbody grafts at C4-C5 and C5-C6. No acute displaced fractures are noted. Chronic compression of superior endplate of T1 with 20% loss of anterior vertebral body height, similar to the prior study. Soft tissues and spinal canal: No prevertebral fluid or swelling. No visible canal hematoma. Disc levels: Status post ACDF at C4-C6. Multilevel degenerative disc disease, most severe at C6-C7. Severe multilevel facet arthropathy bilaterally. Upper chest: Unremarkable. Other: None. IMPRESSION: 1. No evidence of significant acute traumatic injury to the skull, brain or cervical spine. 2. Moderate cerebral and mild cerebellar atrophy with extensive chronic microvascular ischemic changes in the cerebral white matter, similar to the prior study, as above. 3. Severe multilevel degenerative disc disease and cervical spondylosis, status post ACDF at C4-C6, as above. Electronically Signed   By: Trudie Reed M.D.   On: 04/16/2023 10:25   CT CERVICAL SPINE WO CONTRAST  Result Date: 04/16/2023 CLINICAL DATA:  89 year old male with history of trauma from a fall. EXAM: CT HEAD WITHOUT CONTRAST CT CERVICAL SPINE WITHOUT CONTRAST TECHNIQUE: Multidetector CT imaging of the head and cervical spine was performed following the standard protocol without intravenous contrast. Multiplanar CT image reconstructions of the cervical spine were also generated. RADIATION DOSE REDUCTION: This exam was performed according to the departmental dose-optimization program which includes automated exposure control, adjustment of the mA and/or kV according to patient size and/or use of iterative reconstruction technique. COMPARISON:  Head CT 03/28/2023. CT of the  cervical spine 10/02/2022. FINDINGS: CT HEAD FINDINGS Brain: Moderate cerebral and mild cerebellar atrophy with ex vacuo dilatation of the ventricular system. Patchy and confluent areas of decreased attenuation are noted throughout the deep and periventricular white matter of the cerebral hemispheres bilaterally, compatible with chronic microvascular ischemic disease. No evidence of acute infarction, hemorrhage, hydrocephalus, extra-axial collection or mass lesion/mass effect. Vascular: No hyperdense vessel or unexpected calcification. Skull: Normal. Negative for fracture or focal lesion. Sinuses/Orbits: No acute finding. Other: None. CT CERVICAL SPINE FINDINGS Alignment: Straightening of normal cervical lordosis, chronic and similar to the prior study. Alignment is otherwise anatomic. Skull base and vertebrae: Postoperative changes of ACDF are noted at C4-C6 with interbody grafts at C4-C5 and C5-C6. No acute displaced fractures are noted. Chronic compression of superior endplate of T1 with 20% loss of anterior vertebral body height, similar to the prior study. Soft tissues and spinal canal: No prevertebral fluid or swelling. No visible canal hematoma. Disc levels: Status post ACDF at C4-C6. Multilevel degenerative disc disease, most severe at C6-C7. Severe multilevel facet arthropathy bilaterally. Upper chest: Unremarkable. Other: None. IMPRESSION: 1. No evidence of significant acute traumatic injury to the skull, brain or cervical spine. 2. Moderate cerebral and mild cerebellar atrophy with extensive chronic microvascular ischemic changes in the cerebral white matter, similar to the prior study, as above. 3. Severe multilevel degenerative disc disease and cervical spondylosis, status post ACDF at C4-C6, as above. Electronically Signed   By: Trudie Reed M.D.   On: 04/16/2023 10:25   DG Shoulder Right Port  Result Date: 04/16/2023 CLINICAL DATA:  89 year old male with history of trauma from a fall  complaining of right shoulder pain. EXAM: RIGHT SHOULDER - 1 VIEW COMPARISON:  No priors. FINDINGS: There is no evidence of fracture or dislocation. Degenerative changes of osteoarthritis are noted in the glenohumeral and acromioclavicular joints. Soft tissues are unremarkable. IMPRESSION: 1. No acute radiographic abnormality of the right shoulder. Electronically Signed   By: Trudie Reed M.D.   On: 04/16/2023  10:19   DG Elbow Complete Right  Result Date: 04/16/2023 CLINICAL DATA:  89 year old male with history of trauma from a fall complaining of right elbow obtained. EXAM: RIGHT ELBOW - COMPLETE 3+ VIEW COMPARISON:  No priors. FINDINGS: There is no evidence of fracture, dislocation, or joint effusion. There is no evidence of arthropathy or other focal bone abnormality. Soft tissues are unremarkable. IMPRESSION: Negative. Electronically Signed   By: Trudie Reed M.D.   On: 04/16/2023 10:15   DG Chest Port 1 View  Result Date: 04/16/2023 CLINICAL DATA:  89 year old male with history of trauma from a fall. EXAM: PORTABLE CHEST 1 VIEW COMPARISON:  Chest x-ray 03/28/2023. FINDINGS: Lung volumes are normal. No consolidative airspace disease. No pleural effusions. No pneumothorax. No pulmonary nodule or mass noted. Pulmonary vasculature and the cardiomediastinal silhouette are within normal limits. Atherosclerosis in the thoracic aorta. Multiple old healed posterior left-sided rib fractures are again noted. Orthopedic fixation hardware in the lower cervical spine incidentally noted. IMPRESSION: 1.  No radiographic evidence of acute cardiopulmonary disease. 2. Aortic atherosclerosis. Electronically Signed   By: Trudie Reed M.D.   On: 04/16/2023 10:15   DG Pelvis Portable  Result Date: 04/16/2023 CLINICAL DATA:  89 year old male with history of trauma from a fall. EXAM: PORTABLE PELVIS 1-2 VIEWS COMPARISON:  03/29/2023. FINDINGS: Plate and screw fixation device in the right hemipelvis traversing an  old healed right acetabular fracture, similar to the prior study. No definite acute displaced fracture of the bony pelvic ring or either proximal femur as visualized. Bilateral femoral heads project over the acetabuli on this single view examination. Degenerative changes of moderate osteoarthritis are noted in both hip joints. Atherosclerotic calcifications. IMPRESSION: 1. No acute radiographic abnormality of the bony pelvis on this single view examination. Status post ORIF in the right hemipelvis for old right acetabular fracture. 2. Atherosclerosis. Electronically Signed   By: Trudie Reed M.D.   On: 04/16/2023 10:14    Assessment/Plan 1. Hyponatremia - Na+ 128 06/24 - give sodium 1 gram po once - promote hydration with water and gatorade - recheck bmp 07/01  2. Lethargy - noted 06/24 - Haldol, ativan and gabapentin held 06/24 - alert today  3. Moderate Alzheimer's dementia of other onset with mood disturbance (HCC) - followed by Dr. Donell Beers - Trileptal recently increased - continues to have periods of agitation - weights stable - 06/25 Dr. Donell Beers changed Haldol to 1 mg at bedtime - cont memory care    Family/ staff Communication: plan discussed with patient and nurse  Labs/tests ordered: bmp 07/01

## 2023-05-11 ENCOUNTER — Encounter: Payer: Self-pay | Admitting: Orthopedic Surgery

## 2023-05-11 ENCOUNTER — Non-Acute Institutional Stay (SKILLED_NURSING_FACILITY): Payer: Medicare Other | Admitting: Orthopedic Surgery

## 2023-05-11 DIAGNOSIS — R400 Somnolence: Secondary | ICD-10-CM

## 2023-05-11 DIAGNOSIS — S32481D Displaced dome fracture of right acetabulum, subsequent encounter for fracture with routine healing: Secondary | ICD-10-CM | POA: Diagnosis not present

## 2023-05-11 DIAGNOSIS — E871 Hypo-osmolality and hyponatremia: Secondary | ICD-10-CM

## 2023-05-11 DIAGNOSIS — R278 Other lack of coordination: Secondary | ICD-10-CM | POA: Diagnosis not present

## 2023-05-11 DIAGNOSIS — M6389 Disorders of muscle in diseases classified elsewhere, multiple sites: Secondary | ICD-10-CM | POA: Diagnosis not present

## 2023-05-11 DIAGNOSIS — S32810D Multiple fractures of pelvis with stable disruption of pelvic ring, subsequent encounter for fracture with routine healing: Secondary | ICD-10-CM | POA: Diagnosis not present

## 2023-05-11 DIAGNOSIS — S32591D Other specified fracture of right pubis, subsequent encounter for fracture with routine healing: Secondary | ICD-10-CM | POA: Diagnosis not present

## 2023-05-11 DIAGNOSIS — M79651 Pain in right thigh: Secondary | ICD-10-CM | POA: Diagnosis not present

## 2023-05-11 DIAGNOSIS — R4189 Other symptoms and signs involving cognitive functions and awareness: Secondary | ICD-10-CM | POA: Diagnosis not present

## 2023-05-11 DIAGNOSIS — S32599A Other specified fracture of unspecified pubis, initial encounter for closed fracture: Secondary | ICD-10-CM

## 2023-05-11 DIAGNOSIS — F4321 Adjustment disorder with depressed mood: Secondary | ICD-10-CM | POA: Diagnosis not present

## 2023-05-11 DIAGNOSIS — S32411D Displaced fracture of anterior wall of right acetabulum, subsequent encounter for fracture with routine healing: Secondary | ICD-10-CM | POA: Diagnosis not present

## 2023-05-11 MED ORDER — ACETAMINOPHEN 500 MG PO TABS
1000.0000 mg | ORAL_TABLET | Freq: Three times a day (TID) | ORAL | 0 refills | Status: DC | PRN
Start: 2023-05-11 — End: 2023-06-09

## 2023-05-11 NOTE — Progress Notes (Signed)
Location:  [facility] Room Number: 306/A Place of Service:  ALF [phone]) Provider:  Octavia Heir, NP   [name], MD  Patient Care Team: [name], MD as PCP - General (Internal Medicine) Quintella Reichert, MD as PCP - Cardiology (Cardiology) Hillis Range, MD (Inactive) as PCP - Electrophysiology (Cardiology) Hilarie Fredrickson, MD (Gastroenterology) Hillis Range, MD (Inactive) (Cardiology) Donnetta Hail, MD as Consulting Physician (Rheumatology) Berneice Heinrich Delbert Phenix., MD as Consulting Physician (Urology) Antony Contras, MD as Consulting Physician (Ophthalmology) Dannielle Burn (Dentistry) Eileen Stanford, MD as Referring Physician (Allergy and Immunology) Van Clines, MD as Consulting Physician (Neurology) Kathyrn Sheriff, [facility] (Inactive) as Pharmacist (Pharmacist) Van Clines, MD as Consulting Physician (Neurology)  Extended Emergency Contact Information Primary Emergency Contact: Ottaviano,[name] Address: [address]          [place], [place] Home Phone: [phone] Work Phone: [phone] Mobile Phone: [phone] Relation: Spouse Secondary Emergency Contact: [name],[name] Mobile Phone: [phone] Relation: Daughter  Code Status:  DNR Goals of care: Advanced Directive information    05/10/2023    2:19 PM  Advanced Directives  Does Patient Have a Medical Advance Directive? Yes  Type of Estate agent of [place];Living will;Out of facility DNR (pink MOST or yellow form)  Does patient want to make changes to medical advance directive? No - Patient declined  Copy of Healthcare Power of Attorney in Chart? Yes - validated most recent copy scanned in chart (See row information)     Chief Complaint  Patient presents with   Acute Visit    Somnolence     HPI:  Pt is a 89 y.o. male seen today for acute visit due to somnolence.   He currently resides on the memory care  unit due to Alzheimer's dementia. PMH: CAD s/p STEMI 2015, HTN, HLD, PAF, GERD, frequent falls s/p subdural hematoma 09/2022, left hydronephrosis, RA, migraine, OSA, OA, depression and insomnia.   "06/24 he was seen for lethargy. He was not easily aroused for a brief period of time. Haldol, ativan and gabapentin were held. Stat labs revealed Na+ 128. Today, he is alert and at baseline. He was able to engage in conversation with me, did not follow all commands. Afebrile. Vitals stable."  Today, he has increased somnolence per nursing. He was given ativan 0.5 mg po once prior to orthopedic appointment today. 06/18 he had increased agitation towards staff while trying to transport to neurology appointment. I observed him using strong offensive language and trying to hit/grab/pull at staff. He has since has other incidents of agitation with staff. He is followed by Dr. Donell Beers. Trileptal was recently increased this month. 06/25 haldol was reduced to 1 mg at bedtime per Dr. Donell Beers. Trileptal level pending. Stat labs 06/24 unremarkable except for hyponatremia. He was alert during out encounter. No agitation. He fell asleep a few times while I examined him. Afebrile. Vitals stable.       Past Medical History:  Diagnosis Date   CAD (coronary artery disease)    a. s/p inferior STEMI 01/08/2014; LHC 01/08/14: total RCA occlusion s/p 3.5x48mm Xience DES distal RCA and 3.5x28 mm DES mid RCA, 60-70% mid LAD stenosis, EF 55%.   Complication of anesthesia    'long to wake up after back surgery " 09/2003   Diverticulosis of colon    GERD (gastroesophageal reflux disease)    History of cellulitis    05-26-2015  LLE  History of colon polyps    1998- benign/  2008 adenomatous    History of kidney stones    2013   History of squamous cell carcinoma excision    2013;  2015;  06-12-2015 right leg/  02/ and 05/ 2017  left ear and left leg   Hydronephrosis, left    Hyperlipidemia    Hypertension    Migraine  with aura    OSA on CPAP 06/19/2015   Moderate OSA with AHI 18/hr  per study 05-20-2015   Osteoarthritis    Paroxysmal atrial fibrillation (HCC) 4/16   chads2vasc score is at least 4   Premature atrial contractions    RA (rheumatoid arthritis) [facility])    rheumatologist-  dr Alben Deeds   Sinusitis, chronic 10/17/2015   Wears glasses    Wears hearing aid    bilateral   Past Surgical History:  Procedure Laterality Date   BACK SURGERY     CARDIOVASCULAR STRESS TEST  11/21/2015   Low risk nuclear study w/ a small diaphragmatic attenuation artifact, no ischemia/  normal LV function and wall motion , ef 63%   CATARACT EXTRACTION W/ INTRAOCULAR LENS  IMPLANT, BILATERAL  2015   COLONOSCOPY  last one 06-09-2011   CORONARY ANGIOPLASTY     CYSTOSCOPY W/ RETROGRADES Left 07/12/2016   Procedure: CYSTOSCOPY WITH RETROGRADE PYELOGRAM LEFT URETERAL STENT;  Surgeon: Bjorn Pippin, MD;  Location: [facility];  Service: Urology;  Laterality: Left;   CYSTOSCOPY WITH RETROGRADE PYELOGRAM, URETEROSCOPY AND STENT PLACEMENT Left 08/11/2016   Procedure: CYSTOSCOPY WITH RETROGRADE PYELOGRAM,  DIAGNOSTIC URETEROSCOPY , STENT EXCHANGE;  Surgeon: Sebastian Ache, MD;  Location: [facility];  Service: Urology;  Laterality: Left;   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   severe fibromatosis palm and fingers   LEFT HEART CATHETERIZATION WITH CORONARY ANGIOGRAM N/A 01/08/2014   Procedure: LEFT HEART CATHETERIZATION WITH CORONARY ANGIOGRAM;  Surgeon: Lennette Bihari, MD;  Location: [facility] CATH LAB;  Service: Cardiovascular;  Laterality:N/A;  total/ subtotal RCA/  mLAD 60-70% w/ mid systolic bridging/  preserved global LVF, ef 55%   MOHS SURGERY  x2  feb and may 2017   left ear /  left leg  (SCC)   ORIF ACETABULAR FRACTURE Right 03/29/2023   Procedure: OPEN REDUCTION INTERNAL FIXATION (ORIF) ACETABULAR FRACTURE;  Surgeon: Myrene Galas, MD;  Location: [facility] OR;  Service: Orthopedics;  Laterality: Right;    PERCUTANEOUS CORONARY STENT INTERVENTION (PCI-S)  01/08/2014   Procedure: PERCUTANEOUS CORONARY STENT INTERVENTION (PCI-S);  Surgeon: Lennette Bihari, MD;  Location: [facility] CATH LAB;  Service: Cardiovascular;;  DES to mid and distal RCA   POSTERIOR LUMBAR FUSION  10/01/2003   and Laminectomy/ diskectomy  L4 -- S1   ROBOT ASSISTED PYELOPLASTY Left 09/15/2016   Procedure: XI ROBOTIC ASSISTED PYELOPLASTY;  Surgeon: Sebastian Ache, MD;  Location: [facility];  Service: Urology;  Laterality: Left;   TONSILLECTOMY     TRANSTHORACIC ECHOCARDIOGRAM  02/23/2016   mild LVH,  ef 50-55%,  grade 1 diastolic dysfunction/  mild to moderate AV calcification w/ no stenosis or regurg./  trivial MR and TR/  mild PR    Allergies  Allergen Reactions   Methotrexate Other (See Comments)    REACTION: tachycardia   Aricept [Donepezil Hcl] Other (See Comments)    Nightmares. Pt takes this medication per [facility]    Crestor [Rosuvastatin Calcium] Other (See Comments)    Myalgias with 20mg  dose   Lisinopril Other (See Comments)    cough  Namenda [Memantine] Diarrhea and Nausea Only   Atorvastatin Other (See Comments)    Muscle pain, leg cramps   Depakote [Divalproex Sodium] Rash   Pravastatin Sodium Other (See Comments)    REACTION: cramps, fatigue    Outpatient Encounter Medications as of 05/11/2023  Medication Sig   acetaminophen (TYLENOL) 325 MG tablet Take 2 tablets (650 mg total) by mouth in the morning and at bedtime.   apixaban (ELIQUIS) 5 MG TABS tablet Take 1 tablet (5 mg total) by mouth 2 (two) times daily.   camphor-menthol (SARNA) lotion Apply 1 Application topically 2 (two) times daily as needed for itching.   docusate sodium (COLACE) 100 MG capsule Take 1 capsule (100 mg total) by mouth 2 (two) times daily.   donepezil (ARICEPT) 5 MG tablet TAKE ONE TABLET BY MOUTH EVERY MORNING   escitalopram (LEXAPRO) 20 MG tablet Take 20 mg by mouth daily.   Evolocumab (REPATHA SURECLICK) 140 MG/ML SOAJ INJECT 1 PEN INTO  SKIN EVERY 14 DAYS   ezetimibe (ZETIA) 10 MG tablet Take 5 mg by mouth daily.   feeding supplement (ENSURE ENLIVE / ENSURE PLUS) LIQD Take 237 mLs by mouth 3 (three) times daily between meals.   gabapentin (NEURONTIN) 300 MG capsule Take 1 capsule (300 mg total) by mouth 3 (three) times daily.   haloperidol (HALDOL) 1 MG tablet Take 1 mg by mouth at bedtime.   hydrOXYzine (ATARAX) 25 MG tablet Take 25 mg by mouth 2 (two) times daily as needed for itching.   LORazepam (ATIVAN) 0.5 MG tablet Take 0.5 mg by mouth 2 (two) times daily as needed for anxiety.   melatonin 5 MG TABS Take 5 mg by mouth at bedtime.   methocarbamol (ROBAXIN) 500 MG tablet Take 1 tablet (500 mg total) by mouth every 6 (six) hours as needed for muscle spasms.   metoprolol tartrate (LOPRESSOR) 25 MG tablet Take 25 mg by mouth 2 (two) times daily. Hold for SBP <100   Multiple Vitamin (MULTIVITAMIN WITH MINERALS) TABS tablet Take 1 tablet by mouth daily.   nitroGLYCERIN (NITROSTAT) 0.4 MG SL tablet Place 0.4 mg under the tongue every 5 (five) minutes as needed for chest pain.   omeprazole (PRILOSEC) 40 MG capsule Take 40 mg by mouth in the morning. Take on empty stomach 30 minutes before breakfast.   oxcarbazepine (TRILEPTAL) 600 MG tablet Take 600 mg by mouth 2 (two) times daily.   oxyCODONE-acetaminophen (PERCOCET) 5-325 MG tablet Take 1 tablet by mouth every 6 (six) hours as needed for severe pain or moderate pain.   rosuvastatin (CRESTOR) 5 MG tablet Take 5 mg by mouth every morning.   [DISCONTINUED] apixaban (ELIQUIS) 2.5 MG TABS tablet Take 1 tablet (2.5 mg total) by mouth 2 (two) times daily.   No facility-administered encounter medications on file as of 05/11/2023.    Review of Systems  Unable to perform ROS: Dementia    Immunization History  Administered Date(s) Administered   Fluad Quad(high Dose 65+) 07/09/2019, 08/15/2020, 09/16/2021, 07/29/2022   Influenza Split 08/10/2011, 08/15/2012   Influenza Whole  08/21/2009, 09/22/2010   Influenza, High Dose Seasonal PF 09/16/2015, 08/18/2016, 08/31/2017, 08/14/2018   Influenza,inj,Quad PF,6+ Mos 08/06/2013   Influenza-Unspecified 08/31/2017   PFIZER(Purple Top)SARS-COV-2 Vaccination 12/11/2019, 01/01/2020, 08/12/2020   Pfizer Covid-19 Vaccine Bivalent Booster 43yrs & up 03/11/2023   Pneumococcal Conjugate-13 10/02/2013   Pneumococcal Polysaccharide-23 06/04/2010   Tdap 10/22/2011, 09/22/2022   Zoster Recombinat (Shingrix) 03/09/2017, 05/10/2017   Pertinent  Health Maintenance Due  Topic Date  Due   INFLUENZA VACCINE  06/16/2023      10/12/2022    9:31 AM 11/01/2022    2:52 PM 11/02/2022    9:04 AM 11/30/2022    9:57 AM 05/03/2023    4:08 PM  Fall Risk  Falls in the past year? 1 1 1 1 1   Was there an injury with Fall? 1 1 1 1  0  Fall Risk Category Calculator 3 3 3 2 2   Fall Risk Category (Retired) [employer]    (RETIRED) Patient Fall Risk Level High fall risk High fall risk High fall risk    Patient at Risk for Falls Due to History of fall(s);Impaired balance/gait;Impaired mobility  History of fall(s);Impaired balance/gait;Impaired mobility No Fall Risks   Fall risk Follow up Falls evaluation completed Falls evaluation completed Falls evaluation completed Falls evaluation completed Falls evaluation completed   Functional Status Survey:    Vitals:   05/11/23 1624  BP: (!) 152/88  Pulse: 70  Resp: 20  Temp: (!) 97.2 F (36.2 C)  SpO2: 94%  Weight: 184 lb 9.6 oz (83.7 kg)  Height: 6' (1.829 m)   Body mass index is 25.04 kg/m. Physical Exam Vitals reviewed.  Constitutional:      General: He is not in acute distress. HENT:     Head: Normocephalic.  Eyes:     General:        Right eye: No discharge.        Left eye: No discharge.     Comments: Pupils pinpoint  Cardiovascular:     Rate and Rhythm: Normal rate and regular rhythm.     Pulses: Normal pulses.     Heart sounds: Normal heart sounds.  Pulmonary:     Effort:  Pulmonary effort is normal. No respiratory distress.     Breath sounds: Normal breath sounds. No wheezing.  Abdominal:     General: Bowel sounds are normal.     Palpations: Abdomen is soft.  Musculoskeletal:     Cervical back: Neck supple.     Right lower leg: No edema.     Left lower leg: No edema.  Skin:    General: Skin is warm.     Capillary Refill: Capillary refill takes less than 2 seconds.  Neurological:     General: No focal deficit present.     Mental Status: He is easily aroused. Mental status is at baseline.     Motor: Weakness present.     Gait: Gait abnormal.     Comments: Broda chair  Psychiatric:        Mood and Affect: Mood normal.     Comments: Alert to self, does not follow commands     Labs reviewed: Recent Labs    03/29/23 0942 03/29/23 1340 03/30/23 0633 03/31/23 0219 04/05/23 0000 04/16/23 1002 05/09/23 0000  NA 137   < > 137 135 133* 138 128*  K 3.6   < > 4.0 3.5 4.0 3.9 4.2  CL 104   < > 104 102 99 105 96*  CO2 25  --  25 23 23* 27 21  GLUCOSE 112*   < > 148* 112*  --  104*  --   BUN 19   < > 16 20 19 13 15   CREATININE 0.91   < > 0.88 0.77 0.8 0.87 0.8  CALCIUM 8.7*  --  8.2* 8.3* 9.0 8.7*  --   MG 2.1  --  2.0 2.1  --   --   --    < > =  values in this interval not displayed.   Recent Labs    10/02/22 0350 10/11/22 0600 12/26/22 1414 01/26/23 0000 03/28/23 0815 05/09/23 0000  AST 24   < > 30 19 17   --   ALT 16   < > 12 12 16   --   ALKPHOS 61   < > 85 79 56  --   BILITOT 0.6  --  1.0  --  0.6  --   PROT 7.0  --  7.8  --  6.8  --   ALBUMIN 4.0   < > 3.8 3.8 3.6 3.7   < > = values in this interval not displayed.   Recent Labs    07/29/22 1345 09/24/22 0000 12/26/22 1414 03/28/23 0815 03/30/23 1610 03/31/23 0219 04/05/23 0000 04/16/23 1002 05/09/23 0000  WBC 5.3   < > 8.1   < > 7.5 7.5 7.8 8.0 6.5  NEUTROABS 2.7  --  6.2  --   --   --   --  5.6  --   HGB 12.8*   < > 12.7*   < > 9.4* 9.4* 10.1* 10.9* 12.2*  HCT 38.2*   < >  38.3*   < > 28.1* 27.9* 30* 34.3* 37*  MCV 99.3   < > 97.0   < > 94.9 93.6  --  98.0  --   PLT 274.0   < > 272   < > 197 221 434* 466* 350   < > = values in this interval not displayed.   Lab Results  Component Value Date   TSH 0.79 01/26/2023   Lab Results  Component Value Date   HGBA1C 6.4 08/30/2017   Lab Results  Component Value Date   CHOL 126 01/26/2023   HDL 36 01/26/2023   LDLCALC 39 01/26/2023   LDLDIRECT 168.9 05/24/2013   TRIG 254 (A) 01/26/2023   CHOLHDL 4.2 08/04/2020    Significant Diagnostic Results in last 30 days:  CT HEAD WO CONTRAST  Result Date: 04/16/2023 CLINICAL DATA:  89 year old male with history of trauma from a fall. EXAM: CT HEAD WITHOUT CONTRAST CT CERVICAL SPINE WITHOUT CONTRAST TECHNIQUE: Multidetector CT imaging of the head and cervical spine was performed following the standard protocol without intravenous contrast. Multiplanar CT image reconstructions of the cervical spine were also generated. RADIATION DOSE REDUCTION: This exam was performed according to the departmental dose-optimization program which includes automated exposure control, adjustment of the mA and/or kV according to patient size and/or use of iterative reconstruction technique. COMPARISON:  Head CT 03/28/2023. CT of the cervical spine 10/02/2022. FINDINGS: CT HEAD FINDINGS Brain: Moderate cerebral and mild cerebellar atrophy with ex vacuo dilatation of the ventricular system. Patchy and confluent areas of decreased attenuation are noted throughout the deep and periventricular white matter of the cerebral hemispheres bilaterally, compatible with chronic microvascular ischemic disease. No evidence of acute infarction, hemorrhage, hydrocephalus, extra-axial collection or mass lesion/mass effect. Vascular: No hyperdense vessel or unexpected calcification. Skull: Normal. Negative for fracture or focal lesion. Sinuses/Orbits: No acute finding. Other: None. CT CERVICAL SPINE FINDINGS Alignment:  Straightening of normal cervical lordosis, chronic and similar to the prior study. Alignment is otherwise anatomic. Skull base and vertebrae: Postoperative changes of ACDF are noted at C4-C6 with interbody grafts at C4-C5 and C5-C6. No acute displaced fractures are noted. Chronic compression of superior endplate of T1 with 20% loss of anterior vertebral body height, similar to the prior study. Soft tissues and spinal canal: No prevertebral fluid or  swelling. No visible canal hematoma. Disc levels: Status post ACDF at C4-C6. Multilevel degenerative disc disease, most severe at C6-C7. Severe multilevel facet arthropathy bilaterally. Upper chest: Unremarkable. Other: None. IMPRESSION: 1. No evidence of significant acute traumatic injury to the skull, brain or cervical spine. 2. Moderate cerebral and mild cerebellar atrophy with extensive chronic microvascular ischemic changes in the cerebral white matter, similar to the prior study, as above. 3. Severe multilevel degenerative disc disease and cervical spondylosis, status post ACDF at C4-C6, as above. Electronically Signed   By: Trudie Reed M.D.   On: 04/16/2023 10:25   CT CERVICAL SPINE WO CONTRAST  Result Date: 04/16/2023 CLINICAL DATA:  89 year old male with history of trauma from a fall. EXAM: CT HEAD WITHOUT CONTRAST CT CERVICAL SPINE WITHOUT CONTRAST TECHNIQUE: Multidetector CT imaging of the head and cervical spine was performed following the standard protocol without intravenous contrast. Multiplanar CT image reconstructions of the cervical spine were also generated. RADIATION DOSE REDUCTION: This exam was performed according to the departmental dose-optimization program which includes automated exposure control, adjustment of the mA and/or kV according to patient size and/or use of iterative reconstruction technique. COMPARISON:  Head CT 03/28/2023. CT of the cervical spine 10/02/2022. FINDINGS: CT HEAD FINDINGS Brain: Moderate cerebral and mild  cerebellar atrophy with ex vacuo dilatation of the ventricular system. Patchy and confluent areas of decreased attenuation are noted throughout the deep and periventricular white matter of the cerebral hemispheres bilaterally, compatible with chronic microvascular ischemic disease. No evidence of acute infarction, hemorrhage, hydrocephalus, extra-axial collection or mass lesion/mass effect. Vascular: No hyperdense vessel or unexpected calcification. Skull: Normal. Negative for fracture or focal lesion. Sinuses/Orbits: No acute finding. Other: None. CT CERVICAL SPINE FINDINGS Alignment: Straightening of normal cervical lordosis, chronic and similar to the prior study. Alignment is otherwise anatomic. Skull base and vertebrae: Postoperative changes of ACDF are noted at C4-C6 with interbody grafts at C4-C5 and C5-C6. No acute displaced fractures are noted. Chronic compression of superior endplate of T1 with 20% loss of anterior vertebral body height, similar to the prior study. Soft tissues and spinal canal: No prevertebral fluid or swelling. No visible canal hematoma. Disc levels: Status post ACDF at C4-C6. Multilevel degenerative disc disease, most severe at C6-C7. Severe multilevel facet arthropathy bilaterally. Upper chest: Unremarkable. Other: None. IMPRESSION: 1. No evidence of significant acute traumatic injury to the skull, brain or cervical spine. 2. Moderate cerebral and mild cerebellar atrophy with extensive chronic microvascular ischemic changes in the cerebral white matter, similar to the prior study, as above. 3. Severe multilevel degenerative disc disease and cervical spondylosis, status post ACDF at C4-C6, as above. Electronically Signed   By: Trudie Reed M.D.   On: 04/16/2023 10:25   DG Shoulder Right Port  Result Date: 04/16/2023 CLINICAL DATA:  89 year old male with history of trauma from a fall complaining of right shoulder pain. EXAM: RIGHT SHOULDER - 1 VIEW COMPARISON:  No priors.  FINDINGS: There is no evidence of fracture or dislocation. Degenerative changes of osteoarthritis are noted in the glenohumeral and acromioclavicular joints. Soft tissues are unremarkable. IMPRESSION: 1. No acute radiographic abnormality of the right shoulder. Electronically Signed   By: Trudie Reed M.D.   On: 04/16/2023 10:19   DG Elbow Complete Right  Result Date: 04/16/2023 CLINICAL DATA:  89 year old male with history of trauma from a fall complaining of right elbow obtained. EXAM: RIGHT ELBOW - COMPLETE 3+ VIEW COMPARISON:  No priors. FINDINGS: There is no evidence of fracture, dislocation, or  joint effusion. There is no evidence of arthropathy or other focal bone abnormality. Soft tissues are unremarkable. IMPRESSION: Negative. Electronically Signed   By: Trudie Reed M.D.   On: 04/16/2023 10:15   DG Chest Port 1 View  Result Date: 04/16/2023 CLINICAL DATA:  89 year old male with history of trauma from a fall. EXAM: PORTABLE CHEST 1 VIEW COMPARISON:  Chest x-ray 03/28/2023. FINDINGS: Lung volumes are normal. No consolidative airspace disease. No pleural effusions. No pneumothorax. No pulmonary nodule or mass noted. Pulmonary vasculature and the cardiomediastinal silhouette are within normal limits. Atherosclerosis in the thoracic aorta. Multiple old healed posterior left-sided rib fractures are again noted. Orthopedic fixation hardware in the lower cervical spine incidentally noted. IMPRESSION: 1.  No radiographic evidence of acute cardiopulmonary disease. 2. Aortic atherosclerosis. Electronically Signed   By: Trudie Reed M.D.   On: 04/16/2023 10:15   DG Pelvis Portable  Result Date: 04/16/2023 CLINICAL DATA:  89 year old male with history of trauma from a fall. EXAM: PORTABLE PELVIS 1-2 VIEWS COMPARISON:  03/29/2023. FINDINGS: Plate and screw fixation device in the right hemipelvis traversing an old healed right acetabular fracture, similar to the prior study. No definite acute  displaced fracture of the bony pelvic ring or either proximal femur as visualized. Bilateral femoral heads project over the acetabuli on this single view examination. Degenerative changes of moderate osteoarthritis are noted in both hip joints. Atherosclerotic calcifications. IMPRESSION: 1. No acute radiographic abnormality of the bony pelvis on this single view examination. Status post ORIF in the right hemipelvis for old right acetabular fracture. 2. Atherosclerosis. Electronically Signed   By: Trudie Reed M.D.   On: 04/16/2023 10:14    Assessment/Plan 1. Somnolence - ongoing - 06/24 stat labs unremarkable except Na+ 128 - easily aroused today> pupils pinpoint> was given Ativan earlier  - haldol reduced to 1 mg po at bedtime  - discontinue gabapentin, hydroxyzine, oxycodone - Trileptal level pending  - consider CXR/ UA/culture if trileptal level normal   2. Hyponatremia - Na+ 128 - given sodium 1 g po once 06/25 - recheck bmp 06/27  3. Closed stable fracture of multiple pubic rami (HCC) - fall with hospitalization  05/13-05/17> fracture to right acetabular and pubic rami - 05/14 ORIF per Dr. Carola Frost - mostly in wheelchair now> see above - will stop oxycodone - start tylenol 1000 mg po TID prn for pain - acetaminophen (TYLENOL) 500 MG tablet; Take 2 tablets (1,000 mg total) by mouth 3 (three) times daily as needed for moderate pain or fever.  Dispense: 30 tablet; Refill: 0   Family/ staff Communication: plan discussed with patient and nurse  Labs/tests ordered:  bmp 06/27

## 2023-05-12 DIAGNOSIS — R278 Other lack of coordination: Secondary | ICD-10-CM | POA: Diagnosis not present

## 2023-05-12 DIAGNOSIS — F4321 Adjustment disorder with depressed mood: Secondary | ICD-10-CM | POA: Diagnosis not present

## 2023-05-12 DIAGNOSIS — S32411D Displaced fracture of anterior wall of right acetabulum, subsequent encounter for fracture with routine healing: Secondary | ICD-10-CM | POA: Diagnosis not present

## 2023-05-12 DIAGNOSIS — M6389 Disorders of muscle in diseases classified elsewhere, multiple sites: Secondary | ICD-10-CM | POA: Diagnosis not present

## 2023-05-12 DIAGNOSIS — S32591D Other specified fracture of right pubis, subsequent encounter for fracture with routine healing: Secondary | ICD-10-CM | POA: Diagnosis not present

## 2023-05-12 DIAGNOSIS — R4189 Other symptoms and signs involving cognitive functions and awareness: Secondary | ICD-10-CM | POA: Diagnosis not present

## 2023-05-12 DIAGNOSIS — M79651 Pain in right thigh: Secondary | ICD-10-CM | POA: Diagnosis not present

## 2023-05-13 DIAGNOSIS — E871 Hypo-osmolality and hyponatremia: Secondary | ICD-10-CM | POA: Diagnosis not present

## 2023-05-13 LAB — BASIC METABOLIC PANEL
BUN: 14 (ref 4–21)
CO2: 19 (ref 13–22)
Chloride: 96 — AB (ref 99–108)
Creatinine: 0.6 (ref 0.6–1.3)
Glucose: 94
Potassium: 3.7 mEq/L (ref 3.5–5.1)
Sodium: 130 — AB (ref 137–147)

## 2023-05-13 LAB — COMPREHENSIVE METABOLIC PANEL: Calcium: 8.8 (ref 8.7–10.7)

## 2023-05-15 DIAGNOSIS — M79651 Pain in right thigh: Secondary | ICD-10-CM | POA: Diagnosis not present

## 2023-05-15 DIAGNOSIS — S32591D Other specified fracture of right pubis, subsequent encounter for fracture with routine healing: Secondary | ICD-10-CM | POA: Diagnosis not present

## 2023-05-15 DIAGNOSIS — R278 Other lack of coordination: Secondary | ICD-10-CM | POA: Diagnosis not present

## 2023-05-15 DIAGNOSIS — F4321 Adjustment disorder with depressed mood: Secondary | ICD-10-CM | POA: Diagnosis not present

## 2023-05-15 DIAGNOSIS — R4189 Other symptoms and signs involving cognitive functions and awareness: Secondary | ICD-10-CM | POA: Diagnosis not present

## 2023-05-15 DIAGNOSIS — M6389 Disorders of muscle in diseases classified elsewhere, multiple sites: Secondary | ICD-10-CM | POA: Diagnosis not present

## 2023-05-15 DIAGNOSIS — S32411D Displaced fracture of anterior wall of right acetabulum, subsequent encounter for fracture with routine healing: Secondary | ICD-10-CM | POA: Diagnosis not present

## 2023-05-16 DIAGNOSIS — R2681 Unsteadiness on feet: Secondary | ICD-10-CM | POA: Diagnosis not present

## 2023-05-16 DIAGNOSIS — R296 Repeated falls: Secondary | ICD-10-CM | POA: Diagnosis not present

## 2023-05-16 DIAGNOSIS — S32591D Other specified fracture of right pubis, subsequent encounter for fracture with routine healing: Secondary | ICD-10-CM | POA: Diagnosis not present

## 2023-05-16 DIAGNOSIS — M79651 Pain in right thigh: Secondary | ICD-10-CM | POA: Diagnosis not present

## 2023-05-16 DIAGNOSIS — M62561 Muscle wasting and atrophy, not elsewhere classified, right lower leg: Secondary | ICD-10-CM | POA: Diagnosis not present

## 2023-05-16 DIAGNOSIS — R278 Other lack of coordination: Secondary | ICD-10-CM | POA: Diagnosis not present

## 2023-05-16 DIAGNOSIS — M6389 Disorders of muscle in diseases classified elsewhere, multiple sites: Secondary | ICD-10-CM | POA: Diagnosis not present

## 2023-05-16 DIAGNOSIS — E871 Hypo-osmolality and hyponatremia: Secondary | ICD-10-CM | POA: Diagnosis not present

## 2023-05-16 DIAGNOSIS — S32411D Displaced fracture of anterior wall of right acetabulum, subsequent encounter for fracture with routine healing: Secondary | ICD-10-CM | POA: Diagnosis not present

## 2023-05-16 LAB — BASIC METABOLIC PANEL
BUN: 12 (ref 4–21)
CO2: 22 (ref 13–22)
Chloride: 96 — AB (ref 99–108)
Creatinine: 0.7 (ref 0.6–1.3)
Glucose: 97
Potassium: 3.9 mEq/L (ref 3.5–5.1)
Sodium: 131 — AB (ref 137–147)

## 2023-05-16 LAB — COMPREHENSIVE METABOLIC PANEL: Calcium: 9.1 (ref 8.7–10.7)

## 2023-05-17 DIAGNOSIS — S32591D Other specified fracture of right pubis, subsequent encounter for fracture with routine healing: Secondary | ICD-10-CM | POA: Diagnosis not present

## 2023-05-17 DIAGNOSIS — M62561 Muscle wasting and atrophy, not elsewhere classified, right lower leg: Secondary | ICD-10-CM | POA: Diagnosis not present

## 2023-05-17 DIAGNOSIS — R4182 Altered mental status, unspecified: Secondary | ICD-10-CM | POA: Diagnosis not present

## 2023-05-17 DIAGNOSIS — S32411D Displaced fracture of anterior wall of right acetabulum, subsequent encounter for fracture with routine healing: Secondary | ICD-10-CM | POA: Diagnosis not present

## 2023-05-17 DIAGNOSIS — M6389 Disorders of muscle in diseases classified elsewhere, multiple sites: Secondary | ICD-10-CM | POA: Diagnosis not present

## 2023-05-17 DIAGNOSIS — M79651 Pain in right thigh: Secondary | ICD-10-CM | POA: Diagnosis not present

## 2023-05-17 DIAGNOSIS — R278 Other lack of coordination: Secondary | ICD-10-CM | POA: Diagnosis not present

## 2023-05-19 DIAGNOSIS — M6389 Disorders of muscle in diseases classified elsewhere, multiple sites: Secondary | ICD-10-CM | POA: Diagnosis not present

## 2023-05-19 DIAGNOSIS — R278 Other lack of coordination: Secondary | ICD-10-CM | POA: Diagnosis not present

## 2023-05-19 DIAGNOSIS — M62561 Muscle wasting and atrophy, not elsewhere classified, right lower leg: Secondary | ICD-10-CM | POA: Diagnosis not present

## 2023-05-19 DIAGNOSIS — S32591D Other specified fracture of right pubis, subsequent encounter for fracture with routine healing: Secondary | ICD-10-CM | POA: Diagnosis not present

## 2023-05-19 DIAGNOSIS — M79651 Pain in right thigh: Secondary | ICD-10-CM | POA: Diagnosis not present

## 2023-05-19 DIAGNOSIS — S32411D Displaced fracture of anterior wall of right acetabulum, subsequent encounter for fracture with routine healing: Secondary | ICD-10-CM | POA: Diagnosis not present

## 2023-05-20 DIAGNOSIS — S32411D Displaced fracture of anterior wall of right acetabulum, subsequent encounter for fracture with routine healing: Secondary | ICD-10-CM | POA: Diagnosis not present

## 2023-05-20 DIAGNOSIS — S32591D Other specified fracture of right pubis, subsequent encounter for fracture with routine healing: Secondary | ICD-10-CM | POA: Diagnosis not present

## 2023-05-20 DIAGNOSIS — F4323 Adjustment disorder with mixed anxiety and depressed mood: Secondary | ICD-10-CM | POA: Diagnosis not present

## 2023-05-20 DIAGNOSIS — M6389 Disorders of muscle in diseases classified elsewhere, multiple sites: Secondary | ICD-10-CM | POA: Diagnosis not present

## 2023-05-20 DIAGNOSIS — M79651 Pain in right thigh: Secondary | ICD-10-CM | POA: Diagnosis not present

## 2023-05-20 DIAGNOSIS — M62561 Muscle wasting and atrophy, not elsewhere classified, right lower leg: Secondary | ICD-10-CM | POA: Diagnosis not present

## 2023-05-20 DIAGNOSIS — R278 Other lack of coordination: Secondary | ICD-10-CM | POA: Diagnosis not present

## 2023-05-23 DIAGNOSIS — S32591D Other specified fracture of right pubis, subsequent encounter for fracture with routine healing: Secondary | ICD-10-CM | POA: Diagnosis not present

## 2023-05-23 DIAGNOSIS — M6389 Disorders of muscle in diseases classified elsewhere, multiple sites: Secondary | ICD-10-CM | POA: Diagnosis not present

## 2023-05-23 DIAGNOSIS — R278 Other lack of coordination: Secondary | ICD-10-CM | POA: Diagnosis not present

## 2023-05-23 DIAGNOSIS — M79651 Pain in right thigh: Secondary | ICD-10-CM | POA: Diagnosis not present

## 2023-05-23 DIAGNOSIS — S32411D Displaced fracture of anterior wall of right acetabulum, subsequent encounter for fracture with routine healing: Secondary | ICD-10-CM | POA: Diagnosis not present

## 2023-05-23 DIAGNOSIS — M62561 Muscle wasting and atrophy, not elsewhere classified, right lower leg: Secondary | ICD-10-CM | POA: Diagnosis not present

## 2023-05-24 DIAGNOSIS — M62561 Muscle wasting and atrophy, not elsewhere classified, right lower leg: Secondary | ICD-10-CM | POA: Diagnosis not present

## 2023-05-24 DIAGNOSIS — M79651 Pain in right thigh: Secondary | ICD-10-CM | POA: Diagnosis not present

## 2023-05-24 DIAGNOSIS — S32591D Other specified fracture of right pubis, subsequent encounter for fracture with routine healing: Secondary | ICD-10-CM | POA: Diagnosis not present

## 2023-05-24 DIAGNOSIS — R278 Other lack of coordination: Secondary | ICD-10-CM | POA: Diagnosis not present

## 2023-05-24 DIAGNOSIS — M6389 Disorders of muscle in diseases classified elsewhere, multiple sites: Secondary | ICD-10-CM | POA: Diagnosis not present

## 2023-05-24 DIAGNOSIS — S32411D Displaced fracture of anterior wall of right acetabulum, subsequent encounter for fracture with routine healing: Secondary | ICD-10-CM | POA: Diagnosis not present

## 2023-05-26 DIAGNOSIS — M62561 Muscle wasting and atrophy, not elsewhere classified, right lower leg: Secondary | ICD-10-CM | POA: Diagnosis not present

## 2023-05-26 DIAGNOSIS — S32591D Other specified fracture of right pubis, subsequent encounter for fracture with routine healing: Secondary | ICD-10-CM | POA: Diagnosis not present

## 2023-05-26 DIAGNOSIS — R278 Other lack of coordination: Secondary | ICD-10-CM | POA: Diagnosis not present

## 2023-05-26 DIAGNOSIS — M79651 Pain in right thigh: Secondary | ICD-10-CM | POA: Diagnosis not present

## 2023-05-26 DIAGNOSIS — M6389 Disorders of muscle in diseases classified elsewhere, multiple sites: Secondary | ICD-10-CM | POA: Diagnosis not present

## 2023-05-26 DIAGNOSIS — S32411D Displaced fracture of anterior wall of right acetabulum, subsequent encounter for fracture with routine healing: Secondary | ICD-10-CM | POA: Diagnosis not present

## 2023-05-27 DIAGNOSIS — M62561 Muscle wasting and atrophy, not elsewhere classified, right lower leg: Secondary | ICD-10-CM | POA: Diagnosis not present

## 2023-05-27 DIAGNOSIS — R278 Other lack of coordination: Secondary | ICD-10-CM | POA: Diagnosis not present

## 2023-05-27 DIAGNOSIS — M79651 Pain in right thigh: Secondary | ICD-10-CM | POA: Diagnosis not present

## 2023-05-27 DIAGNOSIS — M6389 Disorders of muscle in diseases classified elsewhere, multiple sites: Secondary | ICD-10-CM | POA: Diagnosis not present

## 2023-05-27 DIAGNOSIS — S32591D Other specified fracture of right pubis, subsequent encounter for fracture with routine healing: Secondary | ICD-10-CM | POA: Diagnosis not present

## 2023-05-27 DIAGNOSIS — S32411D Displaced fracture of anterior wall of right acetabulum, subsequent encounter for fracture with routine healing: Secondary | ICD-10-CM | POA: Diagnosis not present

## 2023-05-28 DIAGNOSIS — M62561 Muscle wasting and atrophy, not elsewhere classified, right lower leg: Secondary | ICD-10-CM | POA: Diagnosis not present

## 2023-05-28 DIAGNOSIS — M6389 Disorders of muscle in diseases classified elsewhere, multiple sites: Secondary | ICD-10-CM | POA: Diagnosis not present

## 2023-05-28 DIAGNOSIS — S32591D Other specified fracture of right pubis, subsequent encounter for fracture with routine healing: Secondary | ICD-10-CM | POA: Diagnosis not present

## 2023-05-28 DIAGNOSIS — S32411D Displaced fracture of anterior wall of right acetabulum, subsequent encounter for fracture with routine healing: Secondary | ICD-10-CM | POA: Diagnosis not present

## 2023-05-28 DIAGNOSIS — R278 Other lack of coordination: Secondary | ICD-10-CM | POA: Diagnosis not present

## 2023-05-28 DIAGNOSIS — M79651 Pain in right thigh: Secondary | ICD-10-CM | POA: Diagnosis not present

## 2023-05-30 DIAGNOSIS — M62561 Muscle wasting and atrophy, not elsewhere classified, right lower leg: Secondary | ICD-10-CM | POA: Diagnosis not present

## 2023-05-30 DIAGNOSIS — M6389 Disorders of muscle in diseases classified elsewhere, multiple sites: Secondary | ICD-10-CM | POA: Diagnosis not present

## 2023-05-30 DIAGNOSIS — S32411D Displaced fracture of anterior wall of right acetabulum, subsequent encounter for fracture with routine healing: Secondary | ICD-10-CM | POA: Diagnosis not present

## 2023-05-30 DIAGNOSIS — S32591D Other specified fracture of right pubis, subsequent encounter for fracture with routine healing: Secondary | ICD-10-CM | POA: Diagnosis not present

## 2023-05-30 DIAGNOSIS — R278 Other lack of coordination: Secondary | ICD-10-CM | POA: Diagnosis not present

## 2023-05-30 DIAGNOSIS — M79651 Pain in right thigh: Secondary | ICD-10-CM | POA: Diagnosis not present

## 2023-05-31 DIAGNOSIS — R278 Other lack of coordination: Secondary | ICD-10-CM | POA: Diagnosis not present

## 2023-05-31 DIAGNOSIS — M62561 Muscle wasting and atrophy, not elsewhere classified, right lower leg: Secondary | ICD-10-CM | POA: Diagnosis not present

## 2023-05-31 DIAGNOSIS — S32591D Other specified fracture of right pubis, subsequent encounter for fracture with routine healing: Secondary | ICD-10-CM | POA: Diagnosis not present

## 2023-05-31 DIAGNOSIS — S32411D Displaced fracture of anterior wall of right acetabulum, subsequent encounter for fracture with routine healing: Secondary | ICD-10-CM | POA: Diagnosis not present

## 2023-05-31 DIAGNOSIS — M6389 Disorders of muscle in diseases classified elsewhere, multiple sites: Secondary | ICD-10-CM | POA: Diagnosis not present

## 2023-05-31 DIAGNOSIS — M79651 Pain in right thigh: Secondary | ICD-10-CM | POA: Diagnosis not present

## 2023-06-01 DIAGNOSIS — R278 Other lack of coordination: Secondary | ICD-10-CM | POA: Diagnosis not present

## 2023-06-01 DIAGNOSIS — S32411D Displaced fracture of anterior wall of right acetabulum, subsequent encounter for fracture with routine healing: Secondary | ICD-10-CM | POA: Diagnosis not present

## 2023-06-01 DIAGNOSIS — S32591D Other specified fracture of right pubis, subsequent encounter for fracture with routine healing: Secondary | ICD-10-CM | POA: Diagnosis not present

## 2023-06-01 DIAGNOSIS — M6389 Disorders of muscle in diseases classified elsewhere, multiple sites: Secondary | ICD-10-CM | POA: Diagnosis not present

## 2023-06-01 DIAGNOSIS — M79651 Pain in right thigh: Secondary | ICD-10-CM | POA: Diagnosis not present

## 2023-06-01 DIAGNOSIS — M62561 Muscle wasting and atrophy, not elsewhere classified, right lower leg: Secondary | ICD-10-CM | POA: Diagnosis not present

## 2023-06-02 DIAGNOSIS — M79651 Pain in right thigh: Secondary | ICD-10-CM | POA: Diagnosis not present

## 2023-06-02 DIAGNOSIS — M6389 Disorders of muscle in diseases classified elsewhere, multiple sites: Secondary | ICD-10-CM | POA: Diagnosis not present

## 2023-06-02 DIAGNOSIS — S32591D Other specified fracture of right pubis, subsequent encounter for fracture with routine healing: Secondary | ICD-10-CM | POA: Diagnosis not present

## 2023-06-02 DIAGNOSIS — S32411D Displaced fracture of anterior wall of right acetabulum, subsequent encounter for fracture with routine healing: Secondary | ICD-10-CM | POA: Diagnosis not present

## 2023-06-02 DIAGNOSIS — R278 Other lack of coordination: Secondary | ICD-10-CM | POA: Diagnosis not present

## 2023-06-02 DIAGNOSIS — M62561 Muscle wasting and atrophy, not elsewhere classified, right lower leg: Secondary | ICD-10-CM | POA: Diagnosis not present

## 2023-06-03 DIAGNOSIS — M62561 Muscle wasting and atrophy, not elsewhere classified, right lower leg: Secondary | ICD-10-CM | POA: Diagnosis not present

## 2023-06-03 DIAGNOSIS — M6389 Disorders of muscle in diseases classified elsewhere, multiple sites: Secondary | ICD-10-CM | POA: Diagnosis not present

## 2023-06-03 DIAGNOSIS — S32591D Other specified fracture of right pubis, subsequent encounter for fracture with routine healing: Secondary | ICD-10-CM | POA: Diagnosis not present

## 2023-06-03 DIAGNOSIS — S32411D Displaced fracture of anterior wall of right acetabulum, subsequent encounter for fracture with routine healing: Secondary | ICD-10-CM | POA: Diagnosis not present

## 2023-06-03 DIAGNOSIS — R278 Other lack of coordination: Secondary | ICD-10-CM | POA: Diagnosis not present

## 2023-06-03 DIAGNOSIS — M79651 Pain in right thigh: Secondary | ICD-10-CM | POA: Diagnosis not present

## 2023-06-06 DIAGNOSIS — S32591D Other specified fracture of right pubis, subsequent encounter for fracture with routine healing: Secondary | ICD-10-CM | POA: Diagnosis not present

## 2023-06-06 DIAGNOSIS — M6389 Disorders of muscle in diseases classified elsewhere, multiple sites: Secondary | ICD-10-CM | POA: Diagnosis not present

## 2023-06-06 DIAGNOSIS — M62561 Muscle wasting and atrophy, not elsewhere classified, right lower leg: Secondary | ICD-10-CM | POA: Diagnosis not present

## 2023-06-06 DIAGNOSIS — M79651 Pain in right thigh: Secondary | ICD-10-CM | POA: Diagnosis not present

## 2023-06-06 DIAGNOSIS — R278 Other lack of coordination: Secondary | ICD-10-CM | POA: Diagnosis not present

## 2023-06-06 DIAGNOSIS — S32411D Displaced fracture of anterior wall of right acetabulum, subsequent encounter for fracture with routine healing: Secondary | ICD-10-CM | POA: Diagnosis not present

## 2023-06-07 DIAGNOSIS — M62561 Muscle wasting and atrophy, not elsewhere classified, right lower leg: Secondary | ICD-10-CM | POA: Diagnosis not present

## 2023-06-07 DIAGNOSIS — S32411D Displaced fracture of anterior wall of right acetabulum, subsequent encounter for fracture with routine healing: Secondary | ICD-10-CM | POA: Diagnosis not present

## 2023-06-07 DIAGNOSIS — M6389 Disorders of muscle in diseases classified elsewhere, multiple sites: Secondary | ICD-10-CM | POA: Diagnosis not present

## 2023-06-07 DIAGNOSIS — R278 Other lack of coordination: Secondary | ICD-10-CM | POA: Diagnosis not present

## 2023-06-07 DIAGNOSIS — S32591D Other specified fracture of right pubis, subsequent encounter for fracture with routine healing: Secondary | ICD-10-CM | POA: Diagnosis not present

## 2023-06-07 DIAGNOSIS — M79651 Pain in right thigh: Secondary | ICD-10-CM | POA: Diagnosis not present

## 2023-06-09 ENCOUNTER — Non-Acute Institutional Stay (SKILLED_NURSING_FACILITY): Payer: Medicare Other | Admitting: Adult Health

## 2023-06-09 ENCOUNTER — Encounter: Payer: Self-pay | Admitting: Adult Health

## 2023-06-09 DIAGNOSIS — F02B3 Dementia in other diseases classified elsewhere, moderate, with mood disturbance: Secondary | ICD-10-CM | POA: Diagnosis not present

## 2023-06-09 DIAGNOSIS — S32401K Unspecified fracture of right acetabulum, subsequent encounter for fracture with nonunion: Secondary | ICD-10-CM | POA: Diagnosis not present

## 2023-06-09 DIAGNOSIS — E782 Mixed hyperlipidemia: Secondary | ICD-10-CM

## 2023-06-09 DIAGNOSIS — I480 Paroxysmal atrial fibrillation: Secondary | ICD-10-CM | POA: Diagnosis not present

## 2023-06-09 DIAGNOSIS — M069 Rheumatoid arthritis, unspecified: Secondary | ICD-10-CM | POA: Diagnosis not present

## 2023-06-09 DIAGNOSIS — Z66 Do not resuscitate: Secondary | ICD-10-CM

## 2023-06-09 DIAGNOSIS — S32411D Displaced fracture of anterior wall of right acetabulum, subsequent encounter for fracture with routine healing: Secondary | ICD-10-CM | POA: Diagnosis not present

## 2023-06-09 DIAGNOSIS — I10 Essential (primary) hypertension: Secondary | ICD-10-CM

## 2023-06-09 DIAGNOSIS — S32591D Other specified fracture of right pubis, subsequent encounter for fracture with routine healing: Secondary | ICD-10-CM | POA: Diagnosis not present

## 2023-06-09 DIAGNOSIS — M62561 Muscle wasting and atrophy, not elsewhere classified, right lower leg: Secondary | ICD-10-CM | POA: Diagnosis not present

## 2023-06-09 DIAGNOSIS — M6389 Disorders of muscle in diseases classified elsewhere, multiple sites: Secondary | ICD-10-CM | POA: Diagnosis not present

## 2023-06-09 DIAGNOSIS — R278 Other lack of coordination: Secondary | ICD-10-CM | POA: Diagnosis not present

## 2023-06-09 DIAGNOSIS — G308 Other Alzheimer's disease: Secondary | ICD-10-CM

## 2023-06-09 DIAGNOSIS — M79651 Pain in right thigh: Secondary | ICD-10-CM | POA: Diagnosis not present

## 2023-06-09 MED ORDER — LORAZEPAM 1 MG PO TABS: 1.0000 mg | ORAL_TABLET | Freq: Every day | ORAL | Status: DC

## 2023-06-09 MED ORDER — LORAZEPAM 0.5 MG PO TABS: 0.5000 mg | ORAL_TABLET | Freq: Every morning | ORAL | Status: DC

## 2023-06-09 NOTE — Progress Notes (Signed)
Location:  [facility] Room Number: 306 [id number] Place of Service:  ALF [phone]) Provider:  Fletcher Anon, NP   Patient Care Team: [name], MD as PCP - General (Internal Medicine) Quintella Reichert, MD as PCP - Cardiology (Cardiology) Hillis Range, MD (Inactive) as PCP - Electrophysiology (Cardiology) Hilarie Fredrickson, MD (Gastroenterology) Hillis Range, MD (Inactive) (Cardiology) Donnetta Hail, MD as Consulting Physician (Rheumatology) Berneice Heinrich Delbert Phenix., MD as Consulting Physician (Urology) Antony Contras, MD as Consulting Physician (Ophthalmology) Dannielle Burn (Dentistry) Eileen Stanford, MD as Referring Physician (Allergy and Immunology) Van Clines, MD as Consulting Physician (Neurology) Kathyrn Sheriff, [facility] (Inactive) as Pharmacist (Pharmacist) Van Clines, MD as Consulting Physician (Neurology)  Extended Emergency Contact Information Primary Emergency Contact: Farrugia,[name] Address: [address]          [place], [place] Home Phone: [phone] Work Phone: [phone] Mobile Phone: [phone] Relation: Spouse Secondary Emergency Contact: [name],[name] Mobile Phone: [phone] Relation: Daughter  Code Status:  DNR Goals of care: Advanced Directive information    06/09/2023   10:29 AM  Advanced Directives  Does Patient Have a Medical Advance Directive? Yes  Type of Estate agent of [place];Living will;Out of facility DNR (pink MOST or yellow form)  Does patient want to make changes to medical advance directive? No - Patient declined  Copy of Healthcare Power of Attorney in Chart? Yes - validated most recent copy scanned in chart (See row information)  Pre-existing out of facility DNR order (yellow form or pink MOST form) Yellow form placed in chart (order not valid for inpatient use)     Chief Complaint  Patient presents with   Medical Management of Chronic  Issues    Routine visit. Discuss need for additional covid boosters and AWV    HPI:  Pt is a 89 y.o. male seen today for medical management of chronic diseases.    PMH Afib on eliquis,  hx of CAD with PCI, NSTEMI. GERD, falls  Hx of sleep apnea, no longer using cpap RA was on Orencia, follows with Rheumatology HLD on Repatha, zetia, crestor Hx of depression on lexapro.  Multiple falls with injuries including subdural hematoma in May of 2024  Dementia with behaviors currently followed by Dr Donell Beers. Started on Rexulti 7/18.  Nurse reports he is calm today. Has notes in matrix indicating periods of agitation. Responsive to prn haldol. Also had ativan scheduled 06/06/23. Previously had issues with lethargy which has improved off trileptal.   He had an unwitnessed fall which led to a right acetabular fracture and pubic rami fracture. Admitted to the hospital 03/28/23-04/01/23. Underwent ORIF 03/29/23 [place] approach by Dr Carola Frost. Progress has been slow due to his agitation alternating with lethargy and inability to participate with therapy. He is now beginning to stand and make small progress.  Past Medical History:  Diagnosis Date   CAD (coronary artery disease)    a. s/p inferior STEMI 01/08/2014; LHC 01/08/14: total RCA occlusion s/p 3.5x6mm Xience DES distal RCA and 3.5x28 mm DES mid RCA, 60-70% mid LAD stenosis, EF 55%.   Complication of anesthesia    'long to wake up after back surgery " 09/2003   Diverticulosis of colon    GERD (gastroesophageal reflux disease)    History of cellulitis    05-26-2015  LLE   History of colon polyps    1998- benign/  2008 adenomatous    History of kidney stones  2013   History of squamous cell carcinoma excision    2013;  2015;  06-12-2015 right leg/  02/ and 05/ 2017  left ear and left leg   Hydronephrosis, left    Hyperlipidemia    Hypertension    Migraine with aura    OSA on CPAP 06/19/2015   Moderate OSA with AHI 18/hr  per study 05-20-2015    Osteoarthritis    Paroxysmal atrial fibrillation (HCC) 4/16   chads2vasc score is at least 4   Premature atrial contractions    RA (rheumatoid arthritis) [facility])    rheumatologist-  dr Alben Deeds   Sinusitis, chronic 10/17/2015   Wears glasses    Wears hearing aid    bilateral   Past Surgical History:  Procedure Laterality Date   BACK SURGERY     CARDIOVASCULAR STRESS TEST  11/21/2015   Low risk nuclear study w/ a small diaphragmatic attenuation artifact, no ischemia/  normal LV function and wall motion , ef 63%   CATARACT EXTRACTION W/ INTRAOCULAR LENS  IMPLANT, BILATERAL  2015   COLONOSCOPY  last one 06-09-2011   CORONARY ANGIOPLASTY     CYSTOSCOPY W/ RETROGRADES Left 07/12/2016   Procedure: CYSTOSCOPY WITH RETROGRADE PYELOGRAM LEFT URETERAL STENT;  Surgeon: Bjorn Pippin, MD;  Location: [facility];  Service: Urology;  Laterality: Left;   CYSTOSCOPY WITH RETROGRADE PYELOGRAM, URETEROSCOPY AND STENT PLACEMENT Left 08/11/2016   Procedure: CYSTOSCOPY WITH RETROGRADE PYELOGRAM,  DIAGNOSTIC URETEROSCOPY , STENT EXCHANGE;  Surgeon: Sebastian Ache, MD;  Location: [facility];  Service: Urology;  Laterality: Left;   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   severe fibromatosis palm and fingers   LEFT HEART CATHETERIZATION WITH CORONARY ANGIOGRAM N/A 01/08/2014   Procedure: LEFT HEART CATHETERIZATION WITH CORONARY ANGIOGRAM;  Surgeon: Lennette Bihari, MD;  Location: [facility] CATH LAB;  Service: Cardiovascular;  Laterality:N/A;  total/ subtotal RCA/  mLAD 60-70% w/ mid systolic bridging/  preserved global LVF, ef 55%   MOHS SURGERY  x2  feb and may 2017   left ear /  left leg  (S[facility])   ORIF ACETABULAR FRACTURE Right 03/29/2023   Procedure: OPEN REDUCTION INTERNAL FIXATION (ORIF) ACETABULAR FRACTURE;  Surgeon: Myrene Galas, MD;  Location: [facility] OR;  Service: Orthopedics;  Laterality: Right;   PERCUTANEOUS CORONARY STENT INTERVENTION (PCI-S)  01/08/2014   Procedure: PERCUTANEOUS CORONARY STENT  INTERVENTION (PCI-S);  Surgeon: Lennette Bihari, MD;  Location: [facility] CATH LAB;  Service: Cardiovascular;;  DES to mid and distal RCA   POSTERIOR LUMBAR FUSION  10/01/2003   and Laminectomy/ diskectomy  L4 -- S1   ROBOT ASSISTED PYELOPLASTY Left 09/15/2016   Procedure: XI ROBOTIC ASSISTED PYELOPLASTY;  Surgeon: Sebastian Ache, MD;  Location: [facility];  Service: Urology;  Laterality: Left;   TONSILLECTOMY     TRANSTHORACIC ECHOCARDIOGRAM  02/23/2016   mild LVH,  ef 50-55%,  grade 1 diastolic dysfunction/  mild to moderate AV calcification w/ no stenosis or regurg./  trivial MR and TR/  mild PR    Allergies  Allergen Reactions   Methotrexate Other (See Comments)    REACTION: tachycardia   Aricept [Donepezil Hcl] Other (See Comments)    Nightmares. Pt takes this medication per [facility]    Crestor [Rosuvastatin Calcium] Other (See Comments)    Myalgias with 20mg  dose   Lisinopril Other (See Comments)    cough   Namenda [Memantine] Diarrhea and Nausea Only   Atorvastatin Other (See Comments)    Muscle pain, leg cramps  Depakote [Divalproex Sodium] Rash   Pravastatin Sodium Other (See Comments)    REACTION: cramps, fatigue    Outpatient Encounter Medications as of 06/09/2023  Medication Sig   acetaminophen (TYLENOL) 325 MG tablet Take 2 tablets (650 mg total) by mouth in the morning and at bedtime.   acetaminophen (TYLENOL) 325 MG tablet Take 650 mg by mouth 3 (three) times daily as needed.   apixaban (ELIQUIS) 5 MG TABS tablet Take 1 tablet (5 mg total) by mouth 2 (two) times daily.   brexpiprazole (REXULTI) 1 MG TABS tablet Take 1 mg by mouth at bedtime. 9 pm   camphor-menthol (SARNA) lotion Apply 1 Application topically 2 (two) times daily as needed for itching.   docusate sodium (COLACE) 100 MG capsule Take 1 capsule (100 mg total) by mouth 2 (two) times daily.   donepezil (ARICEPT) 5 MG tablet TAKE ONE TABLET BY MOUTH EVERY MORNING   escitalopram (LEXAPRO) 20 MG tablet Take 20 mg by mouth  daily.   Evolocumab (REPATHA SURECLICK) 140 MG/ML SOAJ INJECT 1 PEN INTO SKIN EVERY 14 DAYS   ezetimibe (ZETIA) 10 MG tablet Take 5 mg by mouth daily.   feeding supplement (ENSURE ENLIVE / ENSURE PLUS) LIQD Take 237 mLs by mouth 3 (three) times daily between meals.   haloperidol (HALDOL) 1 MG tablet Take 1 mg by mouth at bedtime as needed.   LORazepam (ATIVAN) 0.5 MG tablet Take 0.5 mg by mouth 2 (two) times daily as needed for anxiety.   melatonin 5 MG TABS Take 5 mg by mouth at bedtime.   methocarbamol (ROBAXIN) 500 MG tablet Take 1 tablet (500 mg total) by mouth every 6 (six) hours as needed for muscle spasms.   metoprolol tartrate (LOPRESSOR) 25 MG tablet Take 25 mg by mouth 2 (two) times daily. Hold for SBP <100   Multiple Vitamin (MULTIVITAMIN WITH MINERALS) TABS tablet Take 1 tablet by mouth daily.   nitroGLYCERIN (NITROSTAT) 0.4 MG SL tablet Place 0.4 mg under the tongue every 5 (five) minutes as needed for chest pain.   omeprazole (PRILOSEC) 40 MG capsule Take 40 mg by mouth in the morning. Take on empty stomach 30 minutes before breakfast.   rosuvastatin (CRESTOR) 5 MG tablet Take 5 mg by mouth every morning.   [DISCONTINUED] acetaminophen (TYLENOL) 500 MG tablet Take 2 tablets (1,000 mg total) by mouth 3 (three) times daily as needed for moderate pain or fever.   [DISCONTINUED] apixaban (ELIQUIS) 2.5 MG TABS tablet Take 1 tablet (2.5 mg total) by mouth 2 (two) times daily.   [DISCONTINUED] oxcarbazepine (TRILEPTAL) 600 MG tablet Take 600 mg by mouth 2 (two) times daily.   No facility-administered encounter medications on file as of 06/09/2023.    Review of Systems  Constitutional:  Positive for activity change. Negative for appetite change, chills, diaphoresis, fatigue, fever and unexpected weight change.  Respiratory:  Negative for cough, shortness of breath, wheezing and stridor.   Cardiovascular:  Negative for chest pain, palpitations and leg swelling.  Gastrointestinal:   Negative for abdominal distention, abdominal pain, constipation and diarrhea.  Genitourinary:  Negative for difficulty urinating and dysuria.  Musculoskeletal:  Positive for gait problem. Negative for arthralgias, back pain, joint swelling and myalgias.  Neurological:  Positive for weakness. Negative for dizziness, seizures, syncope, facial asymmetry, speech difficulty and headaches.  Hematological:  Negative for adenopathy. Does not bruise/bleed easily.  Psychiatric/Behavioral:  Positive for agitation, behavioral problems and confusion.     Immunization History  Administered Date(s) Administered  Fluad Quad(high Dose 65+) 07/09/2019, 08/15/2020, 09/16/2021, 07/29/2022   Influenza Split 08/10/2011, 08/15/2012   Influenza Whole 08/21/2009, 09/22/2010   Influenza, High Dose Seasonal PF 09/16/2015, 08/18/2016, 08/31/2017, 08/14/2018   Influenza,inj,Quad PF,6+ Mos 08/06/2013   Influenza-Unspecified 08/31/2017   PFIZER(Purple Top)SARS-COV-2 Vaccination 12/11/2019, 01/01/2020, 08/12/2020   Pfizer Covid-19 Vaccine Bivalent Booster 67yrs & up 03/11/2023   Pneumococcal Conjugate-13 10/02/2013   Pneumococcal Polysaccharide-23 06/04/2010   Tdap 10/22/2011, 09/22/2022   Zoster Recombinant(Shingrix) 03/09/2017, 05/10/2017   Pertinent  Health Maintenance Due  Topic Date Due   INFLUENZA VACCINE  06/16/2023      10/12/2022    9:31 AM 11/01/2022    2:52 PM 11/02/2022    9:04 AM 11/30/2022    9:57 AM 05/03/2023    4:08 PM  Fall Risk  Falls in the past year? 1 1 1 1 1   Was there an injury with Fall? 1 1 1 1  0  Fall Risk Category Calculator 3 3 3 2 2   Fall Risk Category (Retired) [employer]    (RETIRED) Patient Fall Risk Level High fall risk High fall risk High fall risk    Patient at Risk for Falls Due to History of fall(s);Impaired balance/gait;Impaired mobility  History of fall(s);Impaired balance/gait;Impaired mobility No Fall Risks   Fall risk Follow up Falls evaluation completed Falls  evaluation completed Falls evaluation completed Falls evaluation completed Falls evaluation completed   Functional Status Survey:    Vitals:   06/09/23 1005 06/09/23 1551  BP: (!) 153/86 125/81  Pulse: 64   SpO2: 95%   Weight: 185 lb 12.8 oz (84.3 kg)   Height: 6' (1.829 m)    Body mass index is 25.2 kg/m. Physical Exam Vitals and nursing note reviewed.  Constitutional:      General: He is not in acute distress.    Appearance: He is not diaphoretic.  HENT:     Head: Normocephalic and atraumatic.     Right Ear: Tympanic membrane and ear canal normal.     Left Ear: Tympanic membrane and ear canal normal.     Nose: Nose normal.     Mouth/Throat:     Mouth: Mucous membranes are moist.     Pharynx: Oropharynx is clear.  Eyes:     Conjunctiva/sclera: Conjunctivae normal.     Pupils: Pupils are equal, round, and reactive to light.  Neck:     Thyroid: No thyromegaly.     Vascular: No JVD.     Trachea: No tracheal deviation.  Cardiovascular:     Rate and Rhythm: Normal rate and regular rhythm.     Heart sounds: No murmur heard. Pulmonary:     Effort: Pulmonary effort is normal. No respiratory distress.     Breath sounds: Normal breath sounds. No wheezing.  Abdominal:     General: Bowel sounds are normal. There is no distension.     Palpations: Abdomen is soft.     Tenderness: There is no abdominal tenderness.  Musculoskeletal:     Right lower leg: No edema.     Left lower leg: No edema.  Lymphadenopathy:     Cervical: No cervical adenopathy.  Skin:    General: Skin is warm and dry.  Neurological:     General: No focal deficit present.     Mental Status: He is alert. Mental status is at baseline.     Labs reviewed: Recent Labs    03/29/23 0942 03/29/23 1340 03/30/23 0633 03/31/23 0219 04/05/23 0000 04/16/23 1002 05/09/23 0000  05/13/23 0000 05/16/23 0000  NA 137   < > 137 135   < > 138 128* 130* 131*  K 3.6   < > 4.0 3.5   < > 3.9 4.2 3.7 3.9  CL 104   < >  104 102   < > 105 96* 96* 96*  CO2 25  --  25 23   < > 27 21 19 22   GLUCOSE 112*   < > 148* 112*  --  104*  --   --   --   BUN 19   < > 16 20   < > 13 15 14 12   CREATININE 0.91   < > 0.88 0.77   < > 0.87 0.8 0.6 0.7  CALCIUM 8.7*  --  8.2* 8.3*   < > 8.7*  --  8.8 9.1  MG 2.1  --  2.0 2.1  --   --   --   --   --    < > = values in this interval not displayed.   Recent Labs    10/02/22 0350 10/11/22 0600 12/26/22 1414 01/26/23 0000 03/28/23 0815 05/09/23 0000  AST 24   < > 30 19 17   --   ALT 16   < > 12 12 16   --   ALKPHOS 61   < > 85 79 56  --   BILITOT 0.6  --  1.0  --  0.6  --   PROT 7.0  --  7.8  --  6.8  --   ALBUMIN 4.0   < > 3.8 3.8 3.6 3.7   < > = values in this interval not displayed.   Recent Labs    07/29/22 1345 09/24/22 0000 12/26/22 1414 03/28/23 0815 03/30/23 1610 03/31/23 0219 04/05/23 0000 04/16/23 1002 05/09/23 0000  WBC 5.3   < > 8.1   < > 7.5 7.5 7.8 8.0 6.5  NEUTROABS 2.7  --  6.2  --   --   --   --  5.6  --   HGB 12.8*   < > 12.7*   < > 9.4* 9.4* 10.1* 10.9* 12.2*  HCT 38.2*   < > 38.3*   < > 28.1* 27.9* 30* 34.3* 37*  MCV 99.3   < > 97.0   < > 94.9 93.6  --  98.0  --   PLT 274.0   < > 272   < > 197 221 434* 466* 350   < > = values in this interval not displayed.   Lab Results  Component Value Date   TSH 0.79 01/26/2023   Lab Results  Component Value Date   HGBA1C 6.4 08/30/2017   Lab Results  Component Value Date   CHOL 126 01/26/2023   HDL 36 01/26/2023   LDLCALC 39 01/26/2023   LDLDIRECT 168.9 05/24/2013   TRIG 254 (A) 01/26/2023   CHOLHDL 4.2 08/04/2020    Significant Diagnostic Results in last 30 days:  No results found.  Assessment/Plan  1. DNR (do not resuscitate) Order updated  - Do not attempt resuscitation (DNR)  2. 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  F/u with ortho as  indicated.  Tylenol and robaxin for pain management  3. 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 Paroxysmal atrial fibrillation (HCC) Rate is controlled with metoprolol  On eliquis for CVA risk reduction    5. Primary hypertension Normal at recheck Continue metoprolol   6. Rheumatoid arthritis involving elbow, unspecified laterality, unspecified whether rheumatoid factor present (HCC) Not currently on orencia Followed with rheumatology but has not been recently as it is difficult for him to leave the facility   7. Mixed hyperlipidemia Continue repatha, zetia.   8. Hyponatremia  NA 131 improved    Family/ staff Communication: nurse  Labs/tests ordered:  NA

## 2023-06-10 DIAGNOSIS — S32411D Displaced fracture of anterior wall of right acetabulum, subsequent encounter for fracture with routine healing: Secondary | ICD-10-CM | POA: Diagnosis not present

## 2023-06-10 DIAGNOSIS — R278 Other lack of coordination: Secondary | ICD-10-CM | POA: Diagnosis not present

## 2023-06-10 DIAGNOSIS — S32591D Other specified fracture of right pubis, subsequent encounter for fracture with routine healing: Secondary | ICD-10-CM | POA: Diagnosis not present

## 2023-06-10 DIAGNOSIS — M79651 Pain in right thigh: Secondary | ICD-10-CM | POA: Diagnosis not present

## 2023-06-10 DIAGNOSIS — M6389 Disorders of muscle in diseases classified elsewhere, multiple sites: Secondary | ICD-10-CM | POA: Diagnosis not present

## 2023-06-10 DIAGNOSIS — M62561 Muscle wasting and atrophy, not elsewhere classified, right lower leg: Secondary | ICD-10-CM | POA: Diagnosis not present

## 2023-06-13 DIAGNOSIS — F4323 Adjustment disorder with mixed anxiety and depressed mood: Secondary | ICD-10-CM | POA: Diagnosis not present

## 2023-06-14 DIAGNOSIS — R278 Other lack of coordination: Secondary | ICD-10-CM | POA: Diagnosis not present

## 2023-06-14 DIAGNOSIS — M6389 Disorders of muscle in diseases classified elsewhere, multiple sites: Secondary | ICD-10-CM | POA: Diagnosis not present

## 2023-06-14 DIAGNOSIS — S32591D Other specified fracture of right pubis, subsequent encounter for fracture with routine healing: Secondary | ICD-10-CM | POA: Diagnosis not present

## 2023-06-14 DIAGNOSIS — S32411D Displaced fracture of anterior wall of right acetabulum, subsequent encounter for fracture with routine healing: Secondary | ICD-10-CM | POA: Diagnosis not present

## 2023-06-14 DIAGNOSIS — M62561 Muscle wasting and atrophy, not elsewhere classified, right lower leg: Secondary | ICD-10-CM | POA: Diagnosis not present

## 2023-06-14 DIAGNOSIS — M79651 Pain in right thigh: Secondary | ICD-10-CM | POA: Diagnosis not present

## 2023-06-15 ENCOUNTER — Encounter: Payer: Self-pay | Admitting: Orthopedic Surgery

## 2023-06-15 ENCOUNTER — Emergency Department (HOSPITAL_COMMUNITY): Payer: Medicare Other

## 2023-06-15 ENCOUNTER — Emergency Department (HOSPITAL_COMMUNITY)
Admission: EM | Admit: 2023-06-15 | Discharge: 2023-06-16 | Disposition: A | Discharge status: Home / Self Care | Payer: Medicare Other | Attending: Emergency Medicine | Admitting: Emergency Medicine

## 2023-06-15 ENCOUNTER — Non-Acute Institutional Stay (SKILLED_NURSING_FACILITY): Payer: Medicare Other | Admitting: Orthopedic Surgery

## 2023-06-15 DIAGNOSIS — Z79899 Other long term (current) drug therapy: Secondary | ICD-10-CM | POA: Diagnosis not present

## 2023-06-15 DIAGNOSIS — R195 Other fecal abnormalities: Secondary | ICD-10-CM | POA: Diagnosis not present

## 2023-06-15 DIAGNOSIS — W19XXXA Unspecified fall, initial encounter: Secondary | ICD-10-CM | POA: Diagnosis not present

## 2023-06-15 DIAGNOSIS — Z043 Encounter for examination and observation following other accident: Secondary | ICD-10-CM | POA: Diagnosis not present

## 2023-06-15 DIAGNOSIS — I4891 Unspecified atrial fibrillation: Secondary | ICD-10-CM | POA: Insufficient documentation

## 2023-06-15 DIAGNOSIS — S0993XA Unspecified injury of face, initial encounter: Secondary | ICD-10-CM | POA: Diagnosis present

## 2023-06-15 DIAGNOSIS — R791 Abnormal coagulation profile: Secondary | ICD-10-CM | POA: Diagnosis not present

## 2023-06-15 DIAGNOSIS — F0390 Unspecified dementia without behavioral disturbance: Secondary | ICD-10-CM | POA: Insufficient documentation

## 2023-06-15 DIAGNOSIS — S2242XA Multiple fractures of ribs, left side, initial encounter for closed fracture: Secondary | ICD-10-CM | POA: Diagnosis not present

## 2023-06-15 DIAGNOSIS — Z7901 Long term (current) use of anticoagulants: Secondary | ICD-10-CM | POA: Diagnosis not present

## 2023-06-15 DIAGNOSIS — F02B3 Dementia in other diseases classified elsewhere, moderate, with mood disturbance: Secondary | ICD-10-CM

## 2023-06-15 DIAGNOSIS — Z981 Arthrodesis status: Secondary | ICD-10-CM | POA: Diagnosis not present

## 2023-06-15 DIAGNOSIS — S32401K Unspecified fracture of right acetabulum, subsequent encounter for fracture with nonunion: Secondary | ICD-10-CM

## 2023-06-15 DIAGNOSIS — R197 Diarrhea, unspecified: Secondary | ICD-10-CM | POA: Diagnosis not present

## 2023-06-15 DIAGNOSIS — G308 Other Alzheimer's disease: Secondary | ICD-10-CM | POA: Diagnosis not present

## 2023-06-15 DIAGNOSIS — S0083XA Contusion of other part of head, initial encounter: Secondary | ICD-10-CM | POA: Diagnosis not present

## 2023-06-15 DIAGNOSIS — S32401D Unspecified fracture of right acetabulum, subsequent encounter for fracture with routine healing: Secondary | ICD-10-CM | POA: Diagnosis not present

## 2023-06-15 DIAGNOSIS — Z85828 Personal history of other malignant neoplasm of skin: Secondary | ICD-10-CM | POA: Insufficient documentation

## 2023-06-15 DIAGNOSIS — I10 Essential (primary) hypertension: Secondary | ICD-10-CM | POA: Diagnosis not present

## 2023-06-15 DIAGNOSIS — R0902 Hypoxemia: Secondary | ICD-10-CM | POA: Diagnosis not present

## 2023-06-15 DIAGNOSIS — R9082 White matter disease, unspecified: Secondary | ICD-10-CM | POA: Diagnosis not present

## 2023-06-15 DIAGNOSIS — S32810D Multiple fractures of pelvis with stable disruption of pelvic ring, subsequent encounter for fracture with routine healing: Secondary | ICD-10-CM | POA: Diagnosis not present

## 2023-06-15 DIAGNOSIS — R072 Precordial pain: Secondary | ICD-10-CM | POA: Diagnosis not present

## 2023-06-15 DIAGNOSIS — Z743 Need for continuous supervision: Secondary | ICD-10-CM | POA: Diagnosis not present

## 2023-06-15 DIAGNOSIS — R9431 Abnormal electrocardiogram [ECG] [EKG]: Secondary | ICD-10-CM | POA: Diagnosis not present

## 2023-06-15 DIAGNOSIS — I499 Cardiac arrhythmia, unspecified: Secondary | ICD-10-CM | POA: Diagnosis not present

## 2023-06-15 DIAGNOSIS — R9089 Other abnormal findings on diagnostic imaging of central nervous system: Secondary | ICD-10-CM | POA: Diagnosis not present

## 2023-06-15 DIAGNOSIS — S32481D Displaced dome fracture of right acetabulum, subsequent encounter for fracture with routine healing: Secondary | ICD-10-CM | POA: Diagnosis not present

## 2023-06-15 LAB — CBC
HCT: 36.3 % — ABNORMAL LOW (ref 39.0–52.0)
Hemoglobin: 11.8 g/dL — ABNORMAL LOW (ref 13.0–17.0)
MCH: 31 pg (ref 26.0–34.0)
MCHC: 32.5 g/dL (ref 30.0–36.0)
MCV: 95.3 fL (ref 80.0–100.0)
Platelets: 316 10*3/uL (ref 150–400)
RBC: 3.81 MIL/uL — ABNORMAL LOW (ref 4.22–5.81)
RDW: 13.2 % (ref 11.5–15.5)
WBC: 6.6 10*3/uL (ref 4.0–10.5)
nRBC: 0 % (ref 0.0–0.2)

## 2023-06-15 LAB — COMPREHENSIVE METABOLIC PANEL
ALT: 16 U/L (ref 0–44)
AST: 17 U/L (ref 15–41)
Albumin: 3.1 g/dL — ABNORMAL LOW (ref 3.5–5.0)
Alkaline Phosphatase: 67 U/L (ref 38–126)
Anion gap: 8 (ref 5–15)
BUN: 12 mg/dL (ref 8–23)
CO2: 24 mmol/L (ref 22–32)
Calcium: 8.6 mg/dL — ABNORMAL LOW (ref 8.9–10.3)
Chloride: 106 mmol/L (ref 98–111)
Creatinine, Ser: 0.85 mg/dL (ref 0.61–1.24)
GFR, Estimated: >60 mL/min (ref >60)
Glucose, Bld: 106 mg/dL — ABNORMAL HIGH (ref 70–99)
Potassium: 3.4 mmol/L — ABNORMAL LOW (ref 3.5–5.1)
Sodium: 138 mmol/L (ref 135–145)
Total Bilirubin: 0.3 mg/dL (ref 0.3–1.2)
Total Protein: 6.4 g/dL — ABNORMAL LOW (ref 6.5–8.1)

## 2023-06-15 LAB — I-STAT CHEM 8, ED
BUN: 13 mg/dL (ref 8–23)
Calcium, Ion: 1.15 mmol/L (ref 1.15–1.40)
Chloride: 105 mmol/L (ref 98–111)
Creatinine, Ser: 0.8 mg/dL (ref 0.61–1.24)
Glucose, Bld: 105 mg/dL — ABNORMAL HIGH (ref 70–99)
HCT: 36 % — ABNORMAL LOW (ref 39.0–52.0)
Hemoglobin: 12.2 g/dL — ABNORMAL LOW (ref 13.0–17.0)
Potassium: 3.6 mmol/L (ref 3.5–5.1)
Sodium: 141 mmol/L (ref 135–145)
TCO2: 23 mmol/L (ref 22–32)

## 2023-06-15 LAB — SAMPLE TO BLOOD BANK

## 2023-06-15 LAB — TROPONIN I (HIGH SENSITIVITY): Troponin I (High Sensitivity): 7 ng/L (ref <18)

## 2023-06-15 LAB — PROTIME-INR
INR: 1.3 — ABNORMAL HIGH (ref 0.8–1.2)
Prothrombin Time: 16.1 seconds — ABNORMAL HIGH (ref 11.4–15.2)

## 2023-06-15 LAB — I-STAT CG4 LACTIC ACID, ED: Lactic Acid, Venous: 0.9 mmol/L (ref 0.5–1.9)

## 2023-06-15 LAB — ETHANOL: Alcohol, Ethyl (B): <10 mg/dL (ref <10)

## 2023-06-15 MED ORDER — LORAZEPAM 0.5 MG PO TABS
0.5000 mg | ORAL_TABLET | Freq: Two times a day (BID) | ORAL | Status: DC
Start: 2023-06-15 — End: 2023-07-22

## 2023-06-15 NOTE — Progress Notes (Signed)
Orthopedic Tech Progress Note Patient Details:  [name] [facility] [birth year] [id number]  Level 2 Trauma. Patient ID: [name], male   DOB: Jun 20, 1933, 89 y.o.   MRN: [id number]  [name] 06/15/2023, 8:48 PM

## 2023-06-15 NOTE — Progress Notes (Unsigned)
Location:  [facility] Room Number: 306/A Place of Service:  ALF [phone]) Provider:  Octavia Heir, NP   [name], MD  Patient Care Team: [name], MD as PCP - General (Internal Medicine) Quintella Reichert, MD as PCP - Cardiology (Cardiology) Hillis Range, MD (Inactive) as PCP - Electrophysiology (Cardiology) Hilarie Fredrickson, MD (Gastroenterology) Hillis Range, MD (Inactive) (Cardiology) Donnetta Hail, MD as Consulting Physician (Rheumatology) Berneice Heinrich Delbert Phenix., MD as Consulting Physician (Urology) Antony Contras, MD as Consulting Physician (Ophthalmology) Dannielle Burn (Dentistry) Eileen Stanford, MD as Referring Physician (Allergy and Immunology) Van Clines, MD as Consulting Physician (Neurology) Kathyrn Sheriff, [facility] (Inactive) as Pharmacist (Pharmacist) Van Clines, MD as Consulting Physician (Neurology)  Extended Emergency Contact Information Primary Emergency Contact: Mand,[name] Address: [address]          [place], [place] Home Phone: [phone] Work Phone: [phone] Mobile Phone: [phone] Relation: Spouse Secondary Emergency Contact: [name],[name] Mobile Phone: [phone] Relation: Daughter  Code Status:  DNR Goals of care: Advanced Directive information    06/09/2023   10:29 AM  Advanced Directives  Does Patient Have a Medical Advance Directive? Yes  Type of Estate agent of [place];Living will;Out of facility DNR (pink MOST or yellow form)  Does patient want to make changes to medical advance directive? No - Patient declined  Copy of Healthcare Power of Attorney in Chart? Yes - validated most recent copy scanned in chart (See row information)  Pre-existing out of facility DNR order (yellow form or pink MOST form) Yellow form placed in chart (order not valid for inpatient use)     Chief Complaint  Patient presents with   Acute Visit     Abnormal stools     HPI:  Pt is a 89 y.o. male seen today for acute visit due to abnormal stools.   He currently resides on the memory care unit due to Alzheimer's dementia. PMH: CAD s/p STEMI 2015, HTN, HLD, PAF, GERD, frequent falls s/p subdural hematoma 09/2022, left hydronephrosis, RA, migraine, OSA, OA, depression and insomnia.   Nursing reports foul smelling loose stool, brown with mucous.He is a poor historian due to dementia. He is eating and drinking normal. No N/V, increased behaviors, diarrhea or fevers. 07/29 no bowel movement. 07/30 he had 2 bowel movements. He does take colace for constipation. Afebrile. Vitals stable.   Continues to have behaviors. Followed y Dr. Donell Beers 07/29 started on ativan 0.5 mg BID and hydroxyzine 50 mg at noon and 5 pm.   H/o right    Past Surgical History:  Procedure Laterality Date   BACK SURGERY     CARDIOVASCULAR STRESS TEST  11/21/2015   Low risk nuclear study w/ a small diaphragmatic attenuation artifact, no ischemia/  normal LV function and wall motion , ef 63%   CATARACT EXTRACTION W/ INTRAOCULAR LENS  IMPLANT, BILATERAL  2015   COLONOSCOPY  last one 06-09-2011   CORONARY ANGIOPLASTY     CYSTOSCOPY W/ RETROGRADES Left 07/12/2016   Procedure: CYSTOSCOPY WITH RETROGRADE PYELOGRAM LEFT URETERAL STENT;  Surgeon: Bjorn Pippin, MD;  Location: [facility];  Service: Urology;  Laterality: Left;   CYSTOSCOPY WITH RETROGRADE PYELOGRAM, URETEROSCOPY AND STENT PLACEMENT Left 08/11/2016   Procedure: CYSTOSCOPY WITH RETROGRADE PYELOGRAM,  DIAGNOSTIC URETEROSCOPY , STENT EXCHANGE;  Surgeon: Sebastian Ache, MD;  Location: [facility];  Service: Urology;  Laterality: Left;   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  severe fibromatosis palm and fingers   LEFT HEART CATHETERIZATION WITH CORONARY ANGIOGRAM N/A 01/08/2014   Procedure: LEFT HEART CATHETERIZATION WITH CORONARY ANGIOGRAM;  Surgeon: Lennette Bihari, MD;  Location: [facility] CATH LAB;   Service: Cardiovascular;  Laterality:N/A;  total/ subtotal RCA/  mLAD 60-70% w/ mid systolic bridging/  preserved global LVF, ef 55%   MOHS SURGERY  x2  feb and may 2017   left ear /  left leg  (S[facility])   ORIF ACETABULAR FRACTURE Right 03/29/2023   Procedure: OPEN REDUCTION INTERNAL FIXATION (ORIF) ACETABULAR FRACTURE;  Surgeon: Myrene Galas, MD;  Location: [facility] OR;  Service: Orthopedics;  Laterality: Right;   PERCUTANEOUS CORONARY STENT INTERVENTION (PCI-S)  01/08/2014   Procedure: PERCUTANEOUS CORONARY STENT INTERVENTION (PCI-S);  Surgeon: Lennette Bihari, MD;  Location: [facility] CATH LAB;  Service: Cardiovascular;;  DES to mid and distal RCA   POSTERIOR LUMBAR FUSION  10/01/2003   and Laminectomy/ diskectomy  L4 -- S1   ROBOT ASSISTED PYELOPLASTY Left 09/15/2016   Procedure: XI ROBOTIC ASSISTED PYELOPLASTY;  Surgeon: Sebastian Ache, MD;  Location: [facility];  Service: Urology;  Laterality: Left;   TONSILLECTOMY     TRANSTHORACIC ECHOCARDIOGRAM  02/23/2016   mild LVH,  ef 50-55%,  grade 1 diastolic dysfunction/  mild to moderate AV calcification w/ no stenosis or regurg./  trivial MR and TR/  mild PR    Allergies  Allergen Reactions   Methotrexate Other (See Comments)    REACTION: tachycardia   Aricept [Donepezil Hcl] Other (See Comments)    Nightmares. Pt takes this medication per [facility]    Crestor [Rosuvastatin Calcium] Other (See Comments)    Myalgias with 20mg  dose   Lisinopril Other (See Comments)    cough   Namenda [Memantine] Diarrhea and Nausea Only   Atorvastatin Other (See Comments)    Muscle pain, leg cramps   Depakote [Divalproex Sodium] Rash   Pravastatin Sodium Other (See Comments)    REACTION: cramps, fatigue    Outpatient Encounter Medications as of 06/15/2023  Medication Sig   acetaminophen (TYLENOL) 325 MG tablet Take 2 tablets (650 mg total) by mouth in the morning and at bedtime.   acetaminophen (TYLENOL) 325 MG tablet Take 650 mg by mouth 3 (three) times daily as needed.    apixaban (ELIQUIS) 5 MG TABS tablet Take 1 tablet (5 mg total) by mouth 2 (two) times daily.   brexpiprazole (REXULTI) 1 MG TABS tablet Take 1 mg by mouth at bedtime. 9 pm   camphor-menthol (SARNA) lotion Apply 1 Application topically 2 (two) times daily as needed for itching.   docusate sodium (COLACE) 100 MG capsule Take 1 capsule (100 mg total) by mouth 2 (two) times daily.   donepezil (ARICEPT) 5 MG tablet TAKE ONE TABLET BY MOUTH EVERY MORNING   escitalopram (LEXAPRO) 20 MG tablet Take 20 mg by mouth daily.   Evolocumab (REPATHA SURECLICK) 140 MG/ML SOAJ INJECT 1 PEN INTO SKIN EVERY 14 DAYS   ezetimibe (ZETIA) 10 MG tablet Take 5 mg by mouth daily.   feeding supplement (ENSURE ENLIVE / ENSURE PLUS) LIQD Take 237 mLs by mouth 3 (three) times daily between meals.   haloperidol (HALDOL) 1 MG tablet Take 1 mg by mouth at bedtime as needed.   LORazepam (ATIVAN) 0.5 MG tablet Take 0.5 mg by mouth 2 (two) times daily as needed for anxiety.   LORazepam (ATIVAN) 0.5 MG tablet Take 1 tablet (0.5 mg total) by mouth in the morning.   LORazepam (ATIVAN) 1  MG tablet Take 1 tablet (1 mg total) by mouth at bedtime.   melatonin 5 MG TABS Take 5 mg by mouth at bedtime.   methocarbamol (ROBAXIN) 500 MG tablet Take 1 tablet (500 mg total) by mouth every 6 (six) hours as needed for muscle spasms.   metoprolol tartrate (LOPRESSOR) 25 MG tablet Take 25 mg by mouth 2 (two) times daily. Hold for SBP <100   Multiple Vitamin (MULTIVITAMIN WITH MINERALS) TABS tablet Take 1 tablet by mouth daily.   nitroGLYCERIN (NITROSTAT) 0.4 MG SL tablet Place 0.4 mg under the tongue every 5 (five) minutes as needed for chest pain.   omeprazole (PRILOSEC) 40 MG capsule Take 40 mg by mouth in the morning. Take on empty stomach 30 minutes before breakfast.   rosuvastatin (CRESTOR) 5 MG tablet Take 5 mg by mouth every morning.   [DISCONTINUED] apixaban (ELIQUIS) 2.5 MG TABS tablet Take 1 tablet (2.5 mg total) by mouth 2 (two) times  daily.   No facility-administered encounter medications on file as of 06/15/2023.    Review of Systems  Unable to perform ROS: Dementia    Immunization History  Administered Date(s) Administered   Fluad Quad(high Dose 65+) 07/09/2019, 08/15/2020, 09/16/2021, 07/29/2022   Influenza Split 08/10/2011, 08/15/2012   Influenza Whole 08/21/2009, 09/22/2010   Influenza, High Dose Seasonal PF 09/16/2015, 08/18/2016, 08/31/2017, 08/14/2018   Influenza,inj,Quad PF,6+ Mos 08/06/2013   Influenza-Unspecified 08/31/2017   PFIZER(Purple Top)SARS-COV-2 Vaccination 12/11/2019, 01/01/2020, 08/12/2020   Pfizer Covid-19 Vaccine Bivalent Booster 34yrs & up 03/11/2023   Pneumococcal Conjugate-13 10/02/2013   Pneumococcal Polysaccharide-23 06/04/2010   Tdap 10/22/2011, 09/22/2022   Zoster Recombinant(Shingrix) 03/09/2017, 05/10/2017   Pertinent  Health Maintenance Due  Topic Date Due   INFLUENZA VACCINE  06/16/2023      10/12/2022    9:31 AM 11/01/2022    2:52 PM 11/02/2022    9:04 AM 11/30/2022    9:57 AM 05/03/2023    4:08 PM  Fall Risk  Falls in the past year? 1 1 1 1 1   Was there an injury with Fall? 1 1 1 1  0  Fall Risk Category Calculator 3 3 3 2 2   Fall Risk Category (Retired) [employer]    (RETIRED) Patient Fall Risk Level High fall risk High fall risk High fall risk    Patient at Risk for Falls Due to History of fall(s);Impaired balance/gait;Impaired mobility  History of fall(s);Impaired balance/gait;Impaired mobility No Fall Risks   Fall risk Follow up Falls evaluation completed Falls evaluation completed Falls evaluation completed Falls evaluation completed Falls evaluation completed   Functional Status Survey:    Vitals:   06/15/23 1453  BP: 131/79  Pulse: 72  Resp: 18  Temp: (!) 96.9 F (36.1 C)  SpO2: 93%  Weight: 185 lb 12.8 oz (84.3 kg)  Height: 6' (1.829 m)   Body mass index is 25.2 kg/m. Physical Exam Vitals reviewed.  Constitutional:      General: He is not  in acute distress. HENT:     Head: Normocephalic.  Eyes:     General:        Right eye: No discharge.        Left eye: No discharge.  Cardiovascular:     Rate and Rhythm: Normal rate and regular rhythm.     Pulses: Normal pulses.     Heart sounds: Normal heart sounds.  Pulmonary:     Effort: Pulmonary effort is normal.     Breath sounds: Normal breath sounds.  Abdominal:  General: There is no distension.     Palpations: Abdomen is soft. There is no mass.     Tenderness: There is no abdominal tenderness. There is no guarding or rebound.     Hernia: No hernia is present.     Comments: Hyperactive BS x 4  Musculoskeletal:     Cervical back: Neck supple.     Right lower leg: No edema.     Left lower leg: No edema.  Skin:    General: Skin is warm.     Capillary Refill: Capillary refill takes less than 2 seconds.  Neurological:     General: No focal deficit present.     Mental Status: He is alert. Mental status is at baseline.     Motor: Weakness present.     Gait: Gait abnormal.  Psychiatric:        Mood and Affect: Mood normal.     Comments: Alert to self/familiar face, followed some commands, aphasia     Labs reviewed: Recent Labs    03/29/23 0942 03/29/23 1340 03/30/23 0633 03/31/23 0219 04/05/23 0000 04/16/23 1002 05/09/23 0000 05/13/23 0000 05/16/23 0000  NA 137   < > 137 135   < > 138 128* 130* 131*  K 3.6   < > 4.0 3.5   < > 3.9 4.2 3.7 3.9  CL 104   < > 104 102   < > 105 96* 96* 96*  CO2 25  --  25 23   < > 27 21 19 22   GLUCOSE 112*   < > 148* 112*  --  104*  --   --   --   BUN 19   < > 16 20   < > 13 15 14 12   CREATININE 0.91   < > 0.88 0.77   < > 0.87 0.8 0.6 0.7  CALCIUM 8.7*  --  8.2* 8.3*   < > 8.7*  --  8.8 9.1  MG 2.1  --  2.0 2.1  --   --   --   --   --    < > = values in this interval not displayed.   Recent Labs    10/02/22 0350 10/11/22 0600 12/26/22 1414 01/26/23 0000 03/28/23 0815 05/09/23 0000  AST 24   < > 30 19 17   --   ALT  16   < > 12 12 16   --   ALKPHOS 61   < > 85 79 56  --   BILITOT 0.6  --  1.0  --  0.6  --   PROT 7.0  --  7.8  --  6.8  --   ALBUMIN 4.0   < > 3.8 3.8 3.6 3.7   < > = values in this interval not displayed.   Recent Labs    07/29/22 1345 09/24/22 0000 12/26/22 1414 03/28/23 0815 03/30/23 0633 03/31/23 0219 04/05/23 0000 04/16/23 1002 05/09/23 0000  WBC 5.3   < > 8.1   < > 7.5 7.5 7.8 8.0 6.5  NEUTROABS 2.7  --  6.2  --   --   --   --  5.6  --   HGB 12.8*   < > 12.7*   < > 9.4* 9.4* 10.1* 10.9* 12.2*  HCT 38.2*   < > 38.3*   < > 28.1* 27.9* 30* 34.3* 37*  MCV 99.3   < > 97.0   < > 94.9 93.6  --  98.0  --  PLT 274.0   < > 272   < > 197 221 434* 466* 350   < > = values in this interval not displayed.   Lab Results  Component Value Date   TSH 0.79 01/26/2023   Lab Results  Component Value Date   HGBA1C 6.4 08/30/2017   Lab Results  Component Value Date   CHOL 126 01/26/2023   HDL 36 01/26/2023   LDLCALC 39 01/26/2023   LDLDIRECT 168.9 05/24/2013   TRIG 254 (A) 01/26/2023   CHOLHDL 4.2 08/04/2020    Significant Diagnostic Results in last 30 days:  No results found.  Assessment/Plan 1. Abnormal stools - nursing reports foul odor, mucous, loose brown stool - exam unremarkable - 2 hospitalizations within last 8 weeks - hgb 11.8 07/31 - stool pcr  2. Moderate Alzheimer's dementia of other onset with mood disturbance (HCC) - followed by Dr. Donell Beers - weights stable - now in [place] chair - cont ativan, hydroxyzine, Rexulti  and Haldol - LORazepam (ATIVAN) 0.5 MG tablet; Take 1 tablet (0.5 mg total) by mouth 2 (two) times daily.  3. 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 Carola Frost - inability to participate with PT due to behaviors  - recently started standing - cont tylenol and robaxin for pain control     Family/ staff Communication: plan discussed with patient and nurse  Labs/tests ordered:  none

## 2023-06-15 NOTE — ED Triage Notes (Signed)
Pt BIB [facility] from [facility] with c/o unwitnessed fall that occurred about 35 minutes ago. Pt on Eliquis. Pt states he remembers falling and denies LOC. Pt hit head and has small abrasion over right eye. Bleeding controlled. Hx of recent R hip fx. Possible Cdiff per facility staff. Pt c/o tenderness to bilateral ribs.   BP 130/60 HR 65 O2 96% room air CBG 119  EMS reports pt had an episode of lethargy en route where he became "out of it" but did not lose consciousness.   GCS 14 baseline.

## 2023-06-15 NOTE — ED Provider Notes (Signed)
[place] EMERGENCY DEPARTMENT AT [facility] Provider Note   CSN: [id number] Arrival date & time: 06/15/23  2039     History  Chief Complaint  Patient presents with   [name] is a 89 y.o. male.  Pt is a 89 yo male with pmhx significant for dementia, hld, gerd, htn, afib (on Eliquis), cad, skin cancer, kidney stones, and arthritis.  Pt had an unwitnessed fall about 35 min pta.  Pt has had some diarrhea and staff was concerned about c. Diff.  Pt does not have any pain now, but told EMS, he had some rib pain.  Pt unable to give hx about fall.  Per epic review, pt has had several falls.       Home Medications Prior to Admission medications   Medication Sig Start Date End Date Taking? Authorizing Provider  acetaminophen (TYLENOL) 325 MG tablet Take 2 tablets (650 mg total) by mouth in the morning and at bedtime. 04/04/23   Fletcher Anon, NP  acetaminophen (TYLENOL) 325 MG tablet Take 650 mg by mouth 3 (three) times daily as needed.    [provider]  apixaban (ELIQUIS) 5 MG TABS tablet Take 1 tablet (5 mg total) by mouth 2 (two) times daily. 11/30/22   [name], MD  brexpiprazole (REXULTI) 1 MG TABS tablet Take 1 mg by mouth at bedtime. 9 pm    [provider]  camphor-menthol (SARNA) lotion Apply 1 Application topically 2 (two) times daily as needed for itching.    [provider]  docusate sodium (COLACE) 100 MG capsule Take 1 capsule (100 mg total) by mouth 2 (two) times daily. 03/31/23   Rolly Salter, MD  donepezil (ARICEPT) 5 MG tablet TAKE ONE TABLET BY MOUTH EVERY MORNING 11/07/22   Plotnikov, Georgina Quint, MD  escitalopram (LEXAPRO) 20 MG tablet Take 20 mg by mouth daily.    [provider]  Evolocumab (REPATHA SURECLICK) 140 MG/ML SOAJ INJECT 1 PEN INTO SKIN EVERY 14 DAYS 03/30/21   Quintella Reichert, MD  ezetimibe (ZETIA) 10 MG tablet Take 5 mg by mouth daily.    [provider]  feeding supplement  (ENSURE ENLIVE / ENSURE PLUS) LIQD Take 237 mLs by mouth 3 (three) times daily between meals. 03/31/23   Rolly Salter, MD  haloperidol (HALDOL) 1 MG tablet Take 1 mg by mouth at bedtime as needed. 04/14/23   [provider]  hydrOXYzine (ATARAX) 50 MG tablet Take 50 mg by mouth 2 (two) times daily. Give at noon and 5pm    [provider]  LORazepam (ATIVAN) 0.5 MG tablet Take 0.5 mg by mouth 2 (two) times daily as needed for anxiety.    [provider]  LORazepam (ATIVAN) 0.5 MG tablet Take 1 tablet (0.5 mg total) by mouth 2 (two) times daily. 06/15/23   Fargo, Amy E, NP  LORazepam (ATIVAN) 1 MG tablet Take 1 tablet (1 mg total) by mouth at bedtime. 06/09/23   Fletcher Anon, NP  melatonin 5 MG TABS Take 5 mg by mouth at bedtime.    [provider]  methocarbamol (ROBAXIN) 500 MG tablet Take 1 tablet (500 mg total) by mouth every 6 (six) hours as needed for muscle spasms. 03/31/23   Rolly Salter, MD  metoprolol tartrate (LOPRESSOR) 25 MG tablet Take 25 mg by mouth 2 (two) times daily. Hold for SBP <100    [provider]  Multiple Vitamin (MULTIVITAMIN WITH  MINERALS) TABS tablet Take 1 tablet by mouth daily. 04/01/23   Rolly Salter, MD  nitroGLYCERIN (NITROSTAT) 0.4 MG SL tablet Place 0.4 mg under the tongue every 5 (five) minutes as needed for chest pain.    [provider]  omeprazole (PRILOSEC) 40 MG capsule Take 40 mg by mouth in the morning. Take on empty stomach 30 minutes before breakfast.    [provider]  rosuvastatin (CRESTOR) 5 MG tablet Take 5 mg by mouth every morning.    [provider]  apixaban (ELIQUIS) 2.5 MG TABS tablet Take 1 tablet (2.5 mg total) by mouth 2 (two) times daily. 10/18/22   Fletcher Anon, NP      Allergies    Methotrexate, Aricept Palma Holter hcl], Crestor [rosuvastatin calcium], Lisinopril, Namenda [memantine], Atorvastatin, Depakote [divalproex sodium], and Pravastatin sodium    Review  of Systems   Review of Systems  Unable to perform ROS: Dementia  All other systems reviewed and are negative.   Physical Exam Updated Vital Signs BP 127/80 (BP Location: Right Arm)   Pulse (!) 58   Temp 98.1 F (36.7 C) (Oral)   Resp 15   SpO2 97%  Physical Exam Vitals and nursing note reviewed.  Constitutional:      Appearance: Normal appearance.  HENT:     Head: Normocephalic.      Right Ear: External ear normal.     Left Ear: External ear normal.     Nose: Nose normal.     Mouth/Throat:     Mouth: Mucous membranes are moist.     Pharynx: Oropharynx is clear.  Eyes:     Extraocular Movements: Extraocular movements intact.     Conjunctiva/sclera: Conjunctivae normal.     Pupils: Pupils are equal, round, and reactive to light.  Neck:     Comments: C-collar in place Cardiovascular:     Rate and Rhythm: Normal rate and regular rhythm.     Pulses: Normal pulses.     Heart sounds: Normal heart sounds.  Pulmonary:     Effort: Pulmonary effort is normal.     Breath sounds: Normal breath sounds.  Abdominal:     General: Abdomen is flat. Bowel sounds are normal.     Palpations: Abdomen is soft.  Musculoskeletal:        General: Normal range of motion.  Skin:    General: Skin is warm.     Capillary Refill: Capillary refill takes less than 2 seconds.  Neurological:     General: No focal deficit present.     Mental Status: He is alert. Mental status is at baseline.  Psychiatric:        Mood and Affect: Mood normal.        Behavior: Behavior normal.     ED Results / Procedures / Treatments   Labs (all labs ordered are listed, but only abnormal results are displayed) Labs Reviewed  COMPREHENSIVE METABOLIC PANEL - Abnormal; Notable for the following components:      Result Value   Potassium 3.4 (*)    Glucose, Bld 106 (*)    Calcium 8.6 (*)    Total Protein 6.4 (*)    Albumin 3.1 (*)    All other components within normal limits  CBC - Abnormal; Notable for the  following components:   RBC 3.81 (*)    Hemoglobin 11.8 (*)    HCT 36.3 (*)    All other components within normal limits  PROTIME-INR - Abnormal; Notable for the  following components:   Prothrombin Time 16.1 (*)    INR 1.3 (*)    All other components within normal limits  I-STAT CHEM 8, ED - Abnormal; Notable for the following components:   Glucose, Bld 105 (*)    Hemoglobin 12.2 (*)    HCT 36.0 (*)    All other components within normal limits  C DIFFICILE QUICK SCREEN W PCR REFLEX    ETHANOL  URINALYSIS, ROUTINE W REFLEX MICROSCOPIC  I-STAT CG4 LACTIC ACID, ED  SAMPLE TO BLOOD BANK  TROPONIN I (HIGH SENSITIVITY)  TROPONIN I (HIGH SENSITIVITY)    EKG EKG Interpretation Date/Time:  Wednesday June 15 2023 20:52:23 EDT Ventricular Rate:  59 PR Interval:  191 QRS Duration:  102 QT Interval:  479 QTC Calculation: 475 R Axis:   -7  Text Interpretation: Sinus rhythm No significant change since last tracing Confirmed by Jacalyn Lefevre [phone]) on 06/15/2023 9:17:45 PM  Radiology DG Pelvis Portable  Result Date: 06/15/2023 CLINICAL DATA:  Un witnessed fall EXAM: PORTABLE PELVIS 1-2 VIEWS COMPARISON:  04/16/2023 FINDINGS: Single frontal view of the chest demonstrates stable postsurgical changes from right acetabular fracture repair and lower lumbar discectomies. No acute fracture, subluxation, or dislocation. Symmetrical hip osteoarthritis unchanged. Sacroiliac joints are normal. IMPRESSION: 1. Stable postsurgical changes.  No acute process. Electronically Signed   By: Sharlet Salina M.D.   On: 06/15/2023 22:08   DG Chest Port 1 View  Result Date: 06/15/2023 CLINICAL DATA:  Un witnessed fall, anticoagulated EXAM: PORTABLE CHEST 1 VIEW COMPARISON:  04/16/2023 FINDINGS: Single frontal view of the chest demonstrates an unremarkable cardiac silhouette. No airspace disease, effusion, or pneumothorax. Stable healed left rib fractures. No acute bony abnormality. IMPRESSION: 1. No acute  intrathoracic process. Electronically Signed   By: Sharlet Salina M.D.   On: 06/15/2023 22:07   CT HEAD WO CONTRAST  Result Date: 06/15/2023 CLINICAL DATA:  Fall EXAM: CT HEAD WITHOUT CONTRAST CT CERVICAL SPINE WITHOUT CONTRAST TECHNIQUE: Multidetector CT imaging of the head and cervical spine was performed following the standard protocol without intravenous contrast. Multiplanar CT image reconstructions of the cervical spine were also generated. RADIATION DOSE REDUCTION: This exam was performed according to the departmental dose-optimization program which includes automated exposure control, adjustment of the mA and/or kV according to patient size and/or use of iterative reconstruction technique. COMPARISON:  04/16/2023 FINDINGS: CT HEAD FINDINGS Brain: No evidence of acute infarction, hemorrhage, hydrocephalus, extra-axial collection or mass lesion/mass effect. Extensive periventricular and deep white matter hypodensity. Mild global cerebral volume loss. Vascular: No hyperdense vessel or unexpected calcification. Skull: Normal. Negative for fracture or focal lesion. Sinuses/Orbits: No acute finding. Other: None. CT CERVICAL SPINE FINDINGS Alignment: Degenerative and postoperative straightening of the normal cervical lordosis. Skull base and vertebrae: No acute fracture. No primary bone lesion or focal pathologic process. Soft tissues and spinal canal: No prevertebral fluid or swelling. No visible canal hematoma. Disc levels: Status post anterior cervical discectomy and fusion of C4-C6, additional ankylosis of C3-C4 and C6-C7. Moderate disc space height loss and osteophytosis of the remaining anatomic cervical levels. Upper chest: Negative. Other: None. IMPRESSION: 1. No acute intracranial pathology. Advanced small-vessel white matter disease and global cerebral volume loss. 2. No fracture or static subluxation of the cervical spine. 3. Status post anterior cervical discectomy and fusion of C4-C6, additional  ankylosis of C3-C4 and C6-C7. Moderate disc space height loss and osteophytosis of the remaining anatomic cervical levels. Electronically Signed   By: Bonna Gains.D.  On: 06/15/2023 21:46   CT CERVICAL SPINE WO CONTRAST  Result Date: 06/15/2023 CLINICAL DATA:  Fall EXAM: CT HEAD WITHOUT CONTRAST CT CERVICAL SPINE WITHOUT CONTRAST TECHNIQUE: Multidetector CT imaging of the head and cervical spine was performed following the standard protocol without intravenous contrast. Multiplanar CT image reconstructions of the cervical spine were also generated. RADIATION DOSE REDUCTION: This exam was performed according to the departmental dose-optimization program which includes automated exposure control, adjustment of the mA and/or kV according to patient size and/or use of iterative reconstruction technique. COMPARISON:  04/16/2023 FINDINGS: CT HEAD FINDINGS Brain: No evidence of acute infarction, hemorrhage, hydrocephalus, extra-axial collection or mass lesion/mass effect. Extensive periventricular and deep white matter hypodensity. Mild global cerebral volume loss. Vascular: No hyperdense vessel or unexpected calcification. Skull: Normal. Negative for fracture or focal lesion. Sinuses/Orbits: No acute finding. Other: None. CT CERVICAL SPINE FINDINGS Alignment: Degenerative and postoperative straightening of the normal cervical lordosis. Skull base and vertebrae: No acute fracture. No primary bone lesion or focal pathologic process. Soft tissues and spinal canal: No prevertebral fluid or swelling. No visible canal hematoma. Disc levels: Status post anterior cervical discectomy and fusion of C4-C6, additional ankylosis of C3-C4 and C6-C7. Moderate disc space height loss and osteophytosis of the remaining anatomic cervical levels. Upper chest: Negative. Other: None. IMPRESSION: 1. No acute intracranial pathology. Advanced small-vessel white matter disease and global cerebral volume loss. 2. No fracture or static  subluxation of the cervical spine. 3. Status post anterior cervical discectomy and fusion of C4-C6, additional ankylosis of C3-C4 and C6-C7. Moderate disc space height loss and osteophytosis of the remaining anatomic cervical levels. Electronically Signed   By: Jearld Lesch M.D.   On: 06/15/2023 21:46    Procedures Procedures    Medications Ordered in ED Medications - No data to display  ED Course/ Medical Decision Making/ A&P Clinical Course as of 06/15/23 2248  Wed Jun 15, 2023  2205 CT HEAD WO CONTRAST [JH]    Clinical Course User Index [JH] Jacalyn Lefevre, MD                                 Medical Decision Making Amount and/or Complexity of Data Reviewed Labs: ordered. Radiology: ordered. Decision-making details documented in ED Course.   This patient presents to the ED for concern of fall, this involves an extensive number of treatment options, and is a complaint that carries with it a high risk of complications and morbidity.  The differential diagnosis includes multiple trauma   Co morbidities that complicate the patient evaluation  dementia, hld, gerd, htn, afib (on Eliquis), cad, skin cancer, kidney stones, and arthritis   Additional history obtained:  Additional history obtained from epic chart review External records from outside source obtained and reviewed including EMS report   Lab Tests:  I Ordered, and personally interpreted labs.  The pertinent results include:  cbc with mild anemia (hgb 11.8), cmp nl, trop nl, lactic nl   Imaging Studies ordered:  I ordered imaging studies including ct head, ct c-spine, cxr, pelvis  I independently visualized and interpreted imaging which showed  CT head/c-spine: 1. No acute intracranial pathology. Advanced small-vessel white  matter disease and global cerebral volume loss.  2. No fracture or static subluxation of the cervical spine.  3. Status post anterior cervical discectomy and fusion of C4-C6,  additional  ankylosis of C3-C4 and C6-C7. Moderate disc space height  loss and  osteophytosis of the remaining anatomic cervical levels.  CXR: No acute intrathoracic process.  Pelvis: 1. Stable postsurgical changes.  No acute process.  I agree with the radiologist interpretation   Cardiac Monitoring:  The patient was maintained on a cardiac monitor.  I personally viewed and interpreted the cardiac monitored which showed an underlying rhythm of: nsr   Medicines ordered and prescription drug management:   I have reviewed the patients home medicines and have made adjustments as needed   Test Considered:  ct   Critical Interventions:  Level 2 trauma   Problem List / ED Course:  Fall: pt has a contusion to his forehead, otherwise no injury.  He is able to stand up.  He is not very steady which is probably why he fell.  Pt is stable for d/c.  I left a message with his wife, but she did not answer the phone.   Reevaluation:  After the interventions noted above, I reevaluated the patient and found that they have :improved   Social Determinants of Health:  Lives in a facility   Dispostion:  After consideration of the diagnostic results and the patients response to treatment, I feel that the patent would benefit from discharge with outpatient f/u.          Final Clinical Impression(s) / ED Diagnoses Final diagnoses:  Fall, initial encounter  On apixaban therapy  Contusion of forehead, initial encounter    Rx / DC Orders ED Discharge Orders     None         Jacalyn Lefevre, MD 06/15/23 2248

## 2023-06-16 DIAGNOSIS — I10 Essential (primary) hypertension: Secondary | ICD-10-CM | POA: Diagnosis not present

## 2023-06-16 DIAGNOSIS — R197 Diarrhea, unspecified: Secondary | ICD-10-CM | POA: Diagnosis not present

## 2023-06-16 DIAGNOSIS — Z7401 Bed confinement status: Secondary | ICD-10-CM | POA: Diagnosis not present

## 2023-06-16 DIAGNOSIS — Z743 Need for continuous supervision: Secondary | ICD-10-CM | POA: Diagnosis not present

## 2023-06-16 DIAGNOSIS — R404 Transient alteration of awareness: Secondary | ICD-10-CM | POA: Diagnosis not present

## 2023-06-16 NOTE — ED Notes (Signed)
Writer attempted to call patient's wife per patient request. No answer at this time. VM left asking wife to call back when she receives message.

## 2023-06-16 NOTE — ED Notes (Signed)
Writer attempted to contact facility. No answer. All paperwork sent with [facility]. Pt awake/alert and in no acute distress upon departure.

## 2023-06-20 DIAGNOSIS — R296 Repeated falls: Secondary | ICD-10-CM | POA: Diagnosis not present

## 2023-06-20 DIAGNOSIS — S32591D Other specified fracture of right pubis, subsequent encounter for fracture with routine healing: Secondary | ICD-10-CM | POA: Diagnosis not present

## 2023-06-20 DIAGNOSIS — M62561 Muscle wasting and atrophy, not elsewhere classified, right lower leg: Secondary | ICD-10-CM | POA: Diagnosis not present

## 2023-06-20 DIAGNOSIS — S32411D Displaced fracture of anterior wall of right acetabulum, subsequent encounter for fracture with routine healing: Secondary | ICD-10-CM | POA: Diagnosis not present

## 2023-06-20 DIAGNOSIS — R2681 Unsteadiness on feet: Secondary | ICD-10-CM | POA: Diagnosis not present

## 2023-06-20 DIAGNOSIS — M79651 Pain in right thigh: Secondary | ICD-10-CM | POA: Diagnosis not present

## 2023-06-22 DIAGNOSIS — M62561 Muscle wasting and atrophy, not elsewhere classified, right lower leg: Secondary | ICD-10-CM | POA: Diagnosis not present

## 2023-06-22 DIAGNOSIS — R296 Repeated falls: Secondary | ICD-10-CM | POA: Diagnosis not present

## 2023-06-22 DIAGNOSIS — S32591D Other specified fracture of right pubis, subsequent encounter for fracture with routine healing: Secondary | ICD-10-CM | POA: Diagnosis not present

## 2023-06-22 DIAGNOSIS — S32411D Displaced fracture of anterior wall of right acetabulum, subsequent encounter for fracture with routine healing: Secondary | ICD-10-CM | POA: Diagnosis not present

## 2023-06-22 DIAGNOSIS — M79651 Pain in right thigh: Secondary | ICD-10-CM | POA: Diagnosis not present

## 2023-06-22 DIAGNOSIS — R2681 Unsteadiness on feet: Secondary | ICD-10-CM | POA: Diagnosis not present

## 2023-06-23 DIAGNOSIS — S32591D Other specified fracture of right pubis, subsequent encounter for fracture with routine healing: Secondary | ICD-10-CM | POA: Diagnosis not present

## 2023-06-23 DIAGNOSIS — R2681 Unsteadiness on feet: Secondary | ICD-10-CM | POA: Diagnosis not present

## 2023-06-23 DIAGNOSIS — M79651 Pain in right thigh: Secondary | ICD-10-CM | POA: Diagnosis not present

## 2023-06-23 DIAGNOSIS — R296 Repeated falls: Secondary | ICD-10-CM | POA: Diagnosis not present

## 2023-06-23 DIAGNOSIS — S32411D Displaced fracture of anterior wall of right acetabulum, subsequent encounter for fracture with routine healing: Secondary | ICD-10-CM | POA: Diagnosis not present

## 2023-06-23 DIAGNOSIS — M62561 Muscle wasting and atrophy, not elsewhere classified, right lower leg: Secondary | ICD-10-CM | POA: Diagnosis not present

## 2023-06-24 DIAGNOSIS — R2681 Unsteadiness on feet: Secondary | ICD-10-CM | POA: Diagnosis not present

## 2023-06-24 DIAGNOSIS — S32411D Displaced fracture of anterior wall of right acetabulum, subsequent encounter for fracture with routine healing: Secondary | ICD-10-CM | POA: Diagnosis not present

## 2023-06-24 DIAGNOSIS — M62561 Muscle wasting and atrophy, not elsewhere classified, right lower leg: Secondary | ICD-10-CM | POA: Diagnosis not present

## 2023-06-24 DIAGNOSIS — R296 Repeated falls: Secondary | ICD-10-CM | POA: Diagnosis not present

## 2023-06-24 DIAGNOSIS — S32591D Other specified fracture of right pubis, subsequent encounter for fracture with routine healing: Secondary | ICD-10-CM | POA: Diagnosis not present

## 2023-06-24 DIAGNOSIS — M79651 Pain in right thigh: Secondary | ICD-10-CM | POA: Diagnosis not present

## 2023-06-25 DIAGNOSIS — M62561 Muscle wasting and atrophy, not elsewhere classified, right lower leg: Secondary | ICD-10-CM | POA: Diagnosis not present

## 2023-06-25 DIAGNOSIS — S32591D Other specified fracture of right pubis, subsequent encounter for fracture with routine healing: Secondary | ICD-10-CM | POA: Diagnosis not present

## 2023-06-25 DIAGNOSIS — R296 Repeated falls: Secondary | ICD-10-CM | POA: Diagnosis not present

## 2023-06-25 DIAGNOSIS — M79651 Pain in right thigh: Secondary | ICD-10-CM | POA: Diagnosis not present

## 2023-06-25 DIAGNOSIS — R2681 Unsteadiness on feet: Secondary | ICD-10-CM | POA: Diagnosis not present

## 2023-06-25 DIAGNOSIS — S32411D Displaced fracture of anterior wall of right acetabulum, subsequent encounter for fracture with routine healing: Secondary | ICD-10-CM | POA: Diagnosis not present

## 2023-06-27 DIAGNOSIS — M62561 Muscle wasting and atrophy, not elsewhere classified, right lower leg: Secondary | ICD-10-CM | POA: Diagnosis not present

## 2023-06-27 DIAGNOSIS — S32411D Displaced fracture of anterior wall of right acetabulum, subsequent encounter for fracture with routine healing: Secondary | ICD-10-CM | POA: Diagnosis not present

## 2023-06-27 DIAGNOSIS — M79651 Pain in right thigh: Secondary | ICD-10-CM | POA: Diagnosis not present

## 2023-06-27 DIAGNOSIS — R296 Repeated falls: Secondary | ICD-10-CM | POA: Diagnosis not present

## 2023-06-27 DIAGNOSIS — R2681 Unsteadiness on feet: Secondary | ICD-10-CM | POA: Diagnosis not present

## 2023-06-27 DIAGNOSIS — S32591D Other specified fracture of right pubis, subsequent encounter for fracture with routine healing: Secondary | ICD-10-CM | POA: Diagnosis not present

## 2023-06-29 DIAGNOSIS — S32591D Other specified fracture of right pubis, subsequent encounter for fracture with routine healing: Secondary | ICD-10-CM | POA: Diagnosis not present

## 2023-06-29 DIAGNOSIS — M62561 Muscle wasting and atrophy, not elsewhere classified, right lower leg: Secondary | ICD-10-CM | POA: Diagnosis not present

## 2023-06-29 DIAGNOSIS — M79651 Pain in right thigh: Secondary | ICD-10-CM | POA: Diagnosis not present

## 2023-06-29 DIAGNOSIS — R296 Repeated falls: Secondary | ICD-10-CM | POA: Diagnosis not present

## 2023-06-29 DIAGNOSIS — R2681 Unsteadiness on feet: Secondary | ICD-10-CM | POA: Diagnosis not present

## 2023-06-29 DIAGNOSIS — S32411D Displaced fracture of anterior wall of right acetabulum, subsequent encounter for fracture with routine healing: Secondary | ICD-10-CM | POA: Diagnosis not present

## 2023-07-01 DIAGNOSIS — M62561 Muscle wasting and atrophy, not elsewhere classified, right lower leg: Secondary | ICD-10-CM | POA: Diagnosis not present

## 2023-07-01 DIAGNOSIS — F4323 Adjustment disorder with mixed anxiety and depressed mood: Secondary | ICD-10-CM | POA: Diagnosis not present

## 2023-07-01 DIAGNOSIS — R2681 Unsteadiness on feet: Secondary | ICD-10-CM | POA: Diagnosis not present

## 2023-07-01 DIAGNOSIS — R296 Repeated falls: Secondary | ICD-10-CM | POA: Diagnosis not present

## 2023-07-01 DIAGNOSIS — M79651 Pain in right thigh: Secondary | ICD-10-CM | POA: Diagnosis not present

## 2023-07-01 DIAGNOSIS — S32411D Displaced fracture of anterior wall of right acetabulum, subsequent encounter for fracture with routine healing: Secondary | ICD-10-CM | POA: Diagnosis not present

## 2023-07-01 DIAGNOSIS — S32591D Other specified fracture of right pubis, subsequent encounter for fracture with routine healing: Secondary | ICD-10-CM | POA: Diagnosis not present

## 2023-07-04 DIAGNOSIS — S32591D Other specified fracture of right pubis, subsequent encounter for fracture with routine healing: Secondary | ICD-10-CM | POA: Diagnosis not present

## 2023-07-04 DIAGNOSIS — M62561 Muscle wasting and atrophy, not elsewhere classified, right lower leg: Secondary | ICD-10-CM | POA: Diagnosis not present

## 2023-07-04 DIAGNOSIS — R2681 Unsteadiness on feet: Secondary | ICD-10-CM | POA: Diagnosis not present

## 2023-07-04 DIAGNOSIS — M79651 Pain in right thigh: Secondary | ICD-10-CM | POA: Diagnosis not present

## 2023-07-04 DIAGNOSIS — R296 Repeated falls: Secondary | ICD-10-CM | POA: Diagnosis not present

## 2023-07-04 DIAGNOSIS — S32411D Displaced fracture of anterior wall of right acetabulum, subsequent encounter for fracture with routine healing: Secondary | ICD-10-CM | POA: Diagnosis not present

## 2023-07-06 DIAGNOSIS — M79651 Pain in right thigh: Secondary | ICD-10-CM | POA: Diagnosis not present

## 2023-07-06 DIAGNOSIS — S32411D Displaced fracture of anterior wall of right acetabulum, subsequent encounter for fracture with routine healing: Secondary | ICD-10-CM | POA: Diagnosis not present

## 2023-07-06 DIAGNOSIS — M62561 Muscle wasting and atrophy, not elsewhere classified, right lower leg: Secondary | ICD-10-CM | POA: Diagnosis not present

## 2023-07-06 DIAGNOSIS — S32591D Other specified fracture of right pubis, subsequent encounter for fracture with routine healing: Secondary | ICD-10-CM | POA: Diagnosis not present

## 2023-07-06 DIAGNOSIS — R2681 Unsteadiness on feet: Secondary | ICD-10-CM | POA: Diagnosis not present

## 2023-07-06 DIAGNOSIS — R296 Repeated falls: Secondary | ICD-10-CM | POA: Diagnosis not present

## 2023-07-08 DIAGNOSIS — R296 Repeated falls: Secondary | ICD-10-CM | POA: Diagnosis not present

## 2023-07-08 DIAGNOSIS — M62561 Muscle wasting and atrophy, not elsewhere classified, right lower leg: Secondary | ICD-10-CM | POA: Diagnosis not present

## 2023-07-08 DIAGNOSIS — R2681 Unsteadiness on feet: Secondary | ICD-10-CM | POA: Diagnosis not present

## 2023-07-08 DIAGNOSIS — S32411D Displaced fracture of anterior wall of right acetabulum, subsequent encounter for fracture with routine healing: Secondary | ICD-10-CM | POA: Diagnosis not present

## 2023-07-08 DIAGNOSIS — M79651 Pain in right thigh: Secondary | ICD-10-CM | POA: Diagnosis not present

## 2023-07-08 DIAGNOSIS — S32591D Other specified fracture of right pubis, subsequent encounter for fracture with routine healing: Secondary | ICD-10-CM | POA: Diagnosis not present

## 2023-07-15 ENCOUNTER — Non-Acute Institutional Stay: Payer: Self-pay | Admitting: Adult Health

## 2023-07-15 ENCOUNTER — Encounter: Payer: Self-pay | Admitting: Adult Health

## 2023-07-15 DIAGNOSIS — G308 Other Alzheimer's disease: Secondary | ICD-10-CM | POA: Diagnosis not present

## 2023-07-15 DIAGNOSIS — E871 Hypo-osmolality and hyponatremia: Secondary | ICD-10-CM

## 2023-07-15 DIAGNOSIS — R251 Tremor, unspecified: Secondary | ICD-10-CM

## 2023-07-15 DIAGNOSIS — F02B3 Dementia in other diseases classified elsewhere, moderate, with mood disturbance: Secondary | ICD-10-CM

## 2023-07-15 NOTE — Progress Notes (Signed)
Location:  Medical illustrator of Service:  ALF [phone]) Provider:  Fletcher Anon, NP   Patient Care Team: [name], MD as PCP - General (Internal Medicine) Quintella Reichert, MD as PCP - Cardiology (Cardiology) Hillis Range, MD (Inactive) as PCP - Electrophysiology (Cardiology) Hilarie Fredrickson, MD (Gastroenterology) Hillis Range, MD (Inactive) (Cardiology) Donnetta Hail, MD as Consulting Physician (Rheumatology) Berneice Heinrich Delbert Phenix., MD as Consulting Physician (Urology) Antony Contras, MD as Consulting Physician (Ophthalmology) Dannielle Burn (Dentistry) Eileen Stanford, MD as Referring Physician (Allergy and Immunology) Van Clines, MD as Consulting Physician (Neurology) Kathyrn Sheriff, [facility] (Inactive) as Pharmacist (Pharmacist) Van Clines, MD as Consulting Physician (Neurology)  Extended Emergency Contact Information Primary Emergency Contact: [name],[name] Address: [address]          [place], [place] Home Phone: [phone] Work Phone: [phone] Mobile Phone: [phone] Relation: Spouse Secondary Emergency Contact: [name],[name] Mobile Phone: [phone] Relation: Daughter  Code Status:  DNR Goals of care: Advanced Directive information    06/09/2023   10:29 AM  Advanced Directives  Does Patient Have a Medical Advance Directive? Yes  Type of Estate agent of [name];Living will;Out of facility DNR (pink MOST or yellow form)  Does patient want to make changes to medical advance directive? No - Patient declined  Copy of Healthcare Power of Attorney in Chart? Yes - validated most recent copy scanned in chart (See row information)  Pre-existing out of facility DNR order (yellow form or pink MOST form) Yellow form placed in chart (order not valid for inpatient use)     Chief Complaint  Patient presents with   Acute Visit    F/u behaviors and low sodium     HPI:  Pt is  a 89 y.o. male seen today for f/u rearding low sodium and behaviors.     PMH Afib on eliquis,  hx of CAD with PCI, NSTEMI. GERD, falls  Hx of sleep apnea, no longer using cpap RA was on Orencia, follows with Rheumatology HLD on Repatha, zetia, crestor Hx of depression on lexapro.  Multiple falls with injuries including subdural hematoma in May of 2024  Dementia with behaviors currently followed by Dr Donell Beers. Started on Rexulti 7/18.  He was having lethargy on trileptal so this was discontinued. Last month he had some loose stools which resolved. Could have been due to the Rexulti. Cdiff was negative. The nurse reports he has some continued agitation in the afternoon and inappropriate comments. He also intermittently now has a hand tremor. No rigidity or fever.  On haldol and ativan as well to control aggression. Ativan has been reduced due to lethargy.   He had an unwitnessed fall which led to a right acetabular fracture and pubic rami fracture. Admitted to the hospital 03/28/23-04/01/23. Underwent ORIF 03/29/23 [place] approach by Dr Carola Frost.   He is now released from therapy. He is able to walk with 1 person assist, a walker and a gait belt. Mostly spends his day in a supportive chair.    He had a fall and hit his head. Suffered a contusion. Went to the ED 06/15/23, CT of the head was negative for acute bleed. Past Medical History:  Diagnosis Date   CAD (coronary artery disease)    a. s/p inferior STEMI 01/08/2014; LHC 01/08/14: total RCA occlusion s/p 3.5x74mm Xience DES distal RCA and 3.5x28 mm DES mid RCA, 60-70% mid LAD stenosis, EF 55%.   Complication  of anesthesia    'long to wake up after back surgery " 09/2003   Diverticulosis of colon    GERD (gastroesophageal reflux disease)    History of cellulitis    05-26-2015  LLE   History of colon polyps    1998- benign/  2008 adenomatous    History of kidney stones    2013   History of squamous cell carcinoma excision    2013;  2015;   06-12-2015 right leg/  02/ and 05/ 2017  left ear and left leg   Hydronephrosis, left    Hyperlipidemia    Hypertension    Migraine with aura    OSA on CPAP 06/19/2015   Moderate OSA with AHI 18/hr  per study 05-20-2015   Osteoarthritis    Paroxysmal atrial fibrillation (HCC) 4/16   chads2vasc score is at least 4   Premature atrial contractions    RA (rheumatoid arthritis) [facility])    rheumatologist-  dr Alben Deeds   Sinusitis, chronic 10/17/2015   Wears glasses    Wears hearing aid    bilateral   Past Surgical History:  Procedure Laterality Date   BACK SURGERY     CARDIOVASCULAR STRESS TEST  11/21/2015   Low risk nuclear study w/ a small diaphragmatic attenuation artifact, no ischemia/  normal LV function and wall motion , ef 63%   CATARACT EXTRACTION W/ INTRAOCULAR LENS  IMPLANT, BILATERAL  2015   COLONOSCOPY  last one 06-09-2011   CORONARY ANGIOPLASTY     CYSTOSCOPY W/ RETROGRADES Left 07/12/2016   Procedure: CYSTOSCOPY WITH RETROGRADE PYELOGRAM LEFT URETERAL STENT;  Surgeon: Bjorn Pippin, MD;  Location: [facility];  Service: Urology;  Laterality: Left;   CYSTOSCOPY WITH RETROGRADE PYELOGRAM, URETEROSCOPY AND STENT PLACEMENT Left 08/11/2016   Procedure: CYSTOSCOPY WITH RETROGRADE PYELOGRAM,  DIAGNOSTIC URETEROSCOPY , STENT EXCHANGE;  Surgeon: Sebastian Ache, MD;  Location: [facility];  Service: Urology;  Laterality: Left;   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   severe fibromatosis palm and fingers   LEFT HEART CATHETERIZATION WITH CORONARY ANGIOGRAM N/A 01/08/2014   Procedure: LEFT HEART CATHETERIZATION WITH CORONARY ANGIOGRAM;  Surgeon: Lennette Bihari, MD;  Location: [facility] CATH LAB;  Service: Cardiovascular;  Laterality:N/A;  total/ subtotal RCA/  mLAD 60-70% w/ mid systolic bridging/  preserved global LVF, ef 55%   MOHS SURGERY  x2  feb and may 2017   left ear /  left leg  (SCC)   ORIF ACETABULAR FRACTURE Right 03/29/2023   Procedure: OPEN REDUCTION INTERNAL  FIXATION (ORIF) ACETABULAR FRACTURE;  Surgeon: Myrene Galas, MD;  Location: [facility] OR;  Service: Orthopedics;  Laterality: Right;   PERCUTANEOUS CORONARY STENT INTERVENTION (PCI-S)  01/08/2014   Procedure: PERCUTANEOUS CORONARY STENT INTERVENTION (PCI-S);  Surgeon: Lennette Bihari, MD;  Location: [facility] CATH LAB;  Service: Cardiovascular;;  DES to mid and distal RCA   POSTERIOR LUMBAR FUSION  10/01/2003   and Laminectomy/ diskectomy  L4 -- S1   ROBOT ASSISTED PYELOPLASTY Left 09/15/2016   Procedure: XI ROBOTIC ASSISTED PYELOPLASTY;  Surgeon: Sebastian Ache, MD;  Location: [facility];  Service: Urology;  Laterality: Left;   TONSILLECTOMY     TRANSTHORACIC ECHOCARDIOGRAM  02/23/2016   mild LVH,  ef 50-55%,  grade 1 diastolic dysfunction/  mild to moderate AV calcification w/ no stenosis or regurg./  trivial MR and TR/  mild PR    Allergies  Allergen Reactions   Methotrexate Other (See Comments)    REACTION: tachycardia   Aricept [Donepezil Hcl]  Other (See Comments)    Nightmares. Pt takes this medication per [facility]    Crestor [Rosuvastatin Calcium] Other (See Comments)    Myalgias with 20mg  dose   Lisinopril Other (See Comments)    cough   Namenda [Memantine] Diarrhea and Nausea Only   Atorvastatin Other (See Comments)    Muscle pain, leg cramps   Depakote [Divalproex Sodium] Rash   Pravastatin Sodium Other (See Comments)    REACTION: cramps, fatigue    Outpatient Encounter Medications as of 07/15/2023  Medication Sig   apixaban (ELIQUIS) 5 MG TABS tablet Take 1 tablet (5 mg total) by mouth 2 (two) times daily.   brexpiprazole (REXULTI) 1 MG TABS tablet Take 3 mg by mouth at bedtime. 9 pm   docusate sodium (COLACE) 100 MG capsule Take 1 capsule (100 mg total) by mouth 2 (two) times daily.   escitalopram (LEXAPRO) 20 MG tablet Take 20 mg by mouth daily.   ezetimibe (ZETIA) 10 MG tablet Take 5 mg by mouth daily.   haloperidol (HALDOL) 1 MG tablet Take 2 mg by mouth daily. In the afternoon    hydrOXYzine (ATARAX) 50 MG tablet Take 50 mg by mouth 2 (two) times daily.   acetaminophen (TYLENOL) 325 MG tablet Take 2 tablets (650 mg total) by mouth in the morning and at bedtime.   acetaminophen (TYLENOL) 325 MG tablet Take 650 mg by mouth 3 (three) times daily as needed.   camphor-menthol (SARNA) lotion Apply 1 Application topically 2 (two) times daily as needed for itching.   donepezil (ARICEPT) 5 MG tablet TAKE ONE TABLET BY MOUTH EVERY MORNING   Evolocumab (REPATHA SURECLICK) 140 MG/ML SOAJ INJECT 1 PEN INTO SKIN EVERY 14 DAYS   feeding supplement (ENSURE ENLIVE / ENSURE PLUS) LIQD Take 237 mLs by mouth 3 (three) times daily between meals.   haloperidol (HALDOL) 1 MG tablet Take 1 mg by mouth at bedtime as needed.   LORazepam (ATIVAN) 0.5 MG tablet Take 1 tablet (0.5 mg total) by mouth 2 (two) times daily.   melatonin 5 MG TABS Take 5 mg by mouth at bedtime.   methocarbamol (ROBAXIN) 500 MG tablet Take 1 tablet (500 mg total) by mouth every 6 (six) hours as needed for muscle spasms.   metoprolol tartrate (LOPRESSOR) 25 MG tablet Take 25 mg by mouth 2 (two) times daily. Hold for SBP <100   Multiple Vitamin (MULTIVITAMIN WITH MINERALS) TABS tablet Take 1 tablet by mouth daily.   nitroGLYCERIN (NITROSTAT) 0.4 MG SL tablet Place 0.4 mg under the tongue every 5 (five) minutes as needed for chest pain.   omeprazole (PRILOSEC) 40 MG capsule Take 40 mg by mouth in the morning. Take on empty stomach 30 minutes before breakfast.   rosuvastatin (CRESTOR) 5 MG tablet Take 5 mg by mouth every morning.   [DISCONTINUED] apixaban (ELIQUIS) 2.5 MG TABS tablet Take 1 tablet (2.5 mg total) by mouth 2 (two) times daily.   [DISCONTINUED] hydrOXYzine (ATARAX) 50 MG tablet Take 50 mg by mouth 2 (two) times daily. Give at noon and 5pm   [DISCONTINUED] LORazepam (ATIVAN) 0.5 MG tablet Take 0.5 mg by mouth 2 (two) times daily as needed for anxiety.   [DISCONTINUED] LORazepam (ATIVAN) 1 MG tablet Take 1 tablet (1  mg total) by mouth at bedtime.   No facility-administered encounter medications on file as of 07/15/2023.    Review of Systems  Constitutional:  Negative for activity change, appetite change, chills, diaphoresis, fatigue, fever and unexpected weight change.  Respiratory:  Negative for cough, shortness of breath, wheezing and stridor.   Cardiovascular:  Negative for chest pain, palpitations and leg swelling.  Gastrointestinal:  Negative for abdominal distention, abdominal pain, constipation and diarrhea.  Genitourinary:  Negative for difficulty urinating and dysuria.  Musculoskeletal:  Positive for gait problem. Negative for arthralgias, back pain, joint swelling and myalgias.  Neurological:  Positive for tremors and weakness. Negative for dizziness, seizures, syncope, facial asymmetry, speech difficulty and headaches.  Hematological:  Negative for adenopathy. Does not bruise/bleed easily.  Psychiatric/Behavioral:  Positive for agitation, behavioral problems and confusion.     Immunization History  Administered Date(s) Administered   Fluad Quad(high Dose 65+) 07/09/2019, 08/15/2020, 09/16/2021, 07/29/2022   Influenza Split 08/10/2011, 08/15/2012   Influenza Whole 08/21/2009, 09/22/2010   Influenza, High Dose Seasonal PF 09/16/2015, 08/18/2016, 08/31/2017, 08/14/2018   Influenza,inj,Quad PF,6+ Mos 08/06/2013   Influenza-Unspecified 08/31/2017   PFIZER(Purple Top)SARS-COV-2 Vaccination 12/11/2019, 01/01/2020, 08/12/2020   Pfizer Covid-19 Vaccine Bivalent Booster 66yrs & up 03/11/2023   Pneumococcal Conjugate-13 10/02/2013   Pneumococcal Polysaccharide-23 06/04/2010   Tdap 10/22/2011, 09/22/2022   Zoster Recombinant(Shingrix) 03/09/2017, 05/10/2017   Pertinent  Health Maintenance Due  Topic Date Due   INFLUENZA VACCINE  06/16/2023      10/12/2022    9:31 AM 11/01/2022    2:52 PM 11/02/2022    9:04 AM 11/30/2022    9:57 AM 05/03/2023    4:08 PM  Fall Risk  Falls in the past year?  1 1 1 1 1   Was there an injury with Fall? 1 1 1 1  0  Fall Risk Category Calculator 3 3 3 2 2   Fall Risk Category (Retired) [employer]    (RETIRED) Patient Fall Risk Level High fall risk High fall risk High fall risk    Patient at Risk for Falls Due to History of fall(s);Impaired balance/gait;Impaired mobility  History of fall(s);Impaired balance/gait;Impaired mobility No Fall Risks   Fall risk Follow up Falls evaluation completed Falls evaluation completed Falls evaluation completed Falls evaluation completed Falls evaluation completed   Functional Status Survey:    Vitals:   07/15/23 1439  BP: (!) 144/81  Pulse: 68  Resp: 16  Temp: 98 F (36.7 C)   There is no height or weight on file to calculate BMI. Physical Exam Vitals and nursing note reviewed.  Constitutional:      General: He is not in acute distress.    Appearance: He is not diaphoretic.  HENT:     Head: Normocephalic and atraumatic.     Right Ear: Tympanic membrane and ear canal normal.     Left Ear: Tympanic membrane and ear canal normal.     Nose: Nose normal.     Mouth/Throat:     Mouth: Mucous membranes are moist.     Pharynx: Oropharynx is clear.  Eyes:     Conjunctiva/sclera: Conjunctivae normal.     Pupils: Pupils are equal, round, and reactive to light.  Neck:     Thyroid: No thyromegaly.     Vascular: No JVD.     Trachea: No tracheal deviation.  Cardiovascular:     Rate and Rhythm: Normal rate and regular rhythm.     Heart sounds: No murmur heard. Pulmonary:     Effort: Pulmonary effort is normal. No respiratory distress.     Breath sounds: Normal breath sounds. No wheezing.  Abdominal:     General: Bowel sounds are normal. There is no distension.     Palpations: Abdomen is  soft.     Tenderness: There is no abdominal tenderness.  Musculoskeletal:     Right lower leg: No edema.     Left lower leg: No edema.  Lymphadenopathy:     Cervical: No cervical adenopathy.  Skin:    General: Skin is  warm and dry.  Neurological:     General: No focal deficit present.     Mental Status: He is alert. Mental status is at baseline.     Comments: Slight tremor to right hand at rest.      Labs reviewed: Recent Labs    03/29/23 0942 03/29/23 1340 03/30/23 0633 03/31/23 0219 04/05/23 0000 04/16/23 1002 05/09/23 0000 05/13/23 0000 05/16/23 0000 06/15/23 2048 06/15/23 2050  NA 137   < > 137 135   < > 138   < > 130* 131* 138 141  K 3.6   < > 4.0 3.5   < > 3.9   < > 3.7 3.9 3.4* 3.6  CL 104   < > 104 102   < > 105   < > 96* 96* 106 105  CO2 25  --  25 23   < > 27   < > 19 22 24   --   GLUCOSE 112*   < > 148* 112*  --  104*  --   --   --  106* 105*  BUN 19   < > 16 20   < > 13   < > 14 12 12 13   CREATININE 0.91   < > 0.88 0.77   < > 0.87   < > 0.6 0.7 0.85 0.80  CALCIUM 8.7*  --  8.2* 8.3*   < > 8.7*  --  8.8 9.1 8.6*  --   MG 2.1  --  2.0 2.1  --   --   --   --   --   --   --    < > = values in this interval not displayed.   Recent Labs    12/26/22 1414 01/26/23 0000 03/28/23 0815 05/09/23 0000 06/15/23 2048  AST 30 19 17   --  17  ALT 12 12 16   --  16  ALKPHOS 85 79 56  --  67  BILITOT 1.0  --  0.6  --  0.3  PROT 7.8  --  6.8  --  6.4*  ALBUMIN 3.8 3.8 3.6 3.7 3.1*   Recent Labs    07/29/22 1345 09/24/22 0000 12/26/22 1414 03/28/23 0815 03/31/23 0219 04/05/23 0000 04/16/23 1002 05/09/23 0000 06/15/23 2048 06/15/23 2050  WBC 5.3   < > 8.1   < > 7.5   < > 8.0 6.5 6.6  --   NEUTROABS 2.7  --  6.2  --   --   --  5.6  --   --   --   HGB 12.8*   < > 12.7*   < > 9.4*   < > 10.9* 12.2* 11.8* 12.2*  HCT 38.2*   < > 38.3*   < > 27.9*   < > 34.3* 37* 36.3* 36.0*  MCV 99.3   < > 97.0   < > 93.6  --  98.0  --  95.3  --   PLT 274.0   < > 272   < > 221   < > 466* 350 316  --    < > = values in this interval not displayed.   Lab Results  Component Value Date  TSH 0.79 01/26/2023   Lab Results  Component Value Date   HGBA1C 6.4 08/30/2017   Lab Results  Component  Value Date   CHOL 126 01/26/2023   HDL 36 01/26/2023   LDLCALC 39 01/26/2023   LDLDIRECT 168.9 05/24/2013   TRIG 254 (A) 01/26/2023   CHOLHDL 4.2 08/04/2020    Significant Diagnostic Results in last 30 days:  DG Pelvis Portable  Result Date: 06/15/2023 CLINICAL DATA:  Un witnessed fall EXAM: PORTABLE PELVIS 1-2 VIEWS COMPARISON:  04/16/2023 FINDINGS: Single frontal view of the chest demonstrates stable postsurgical changes from right acetabular fracture repair and lower lumbar discectomies. No acute fracture, subluxation, or dislocation. Symmetrical hip osteoarthritis unchanged. Sacroiliac joints are normal. IMPRESSION: 1. Stable postsurgical changes.  No acute process. Electronically Signed   By: Sharlet Salina M.D.   On: 06/15/2023 22:08   DG Chest Port 1 View  Result Date: 06/15/2023 CLINICAL DATA:  Un witnessed fall, anticoagulated EXAM: PORTABLE CHEST 1 VIEW COMPARISON:  04/16/2023 FINDINGS: Single frontal view of the chest demonstrates an unremarkable cardiac silhouette. No airspace disease, effusion, or pneumothorax. Stable healed left rib fractures. No acute bony abnormality. IMPRESSION: 1. No acute intrathoracic process. Electronically Signed   By: Sharlet Salina M.D.   On: 06/15/2023 22:07   CT HEAD WO CONTRAST  Result Date: 06/15/2023 CLINICAL DATA:  Fall EXAM: CT HEAD WITHOUT CONTRAST CT CERVICAL SPINE WITHOUT CONTRAST TECHNIQUE: Multidetector CT imaging of the head and cervical spine was performed following the standard protocol without intravenous contrast. Multiplanar CT image reconstructions of the cervical spine were also generated. RADIATION DOSE REDUCTION: This exam was performed according to the departmental dose-optimization program which includes automated exposure control, adjustment of the mA and/or kV according to patient size and/or use of iterative reconstruction technique. COMPARISON:  04/16/2023 FINDINGS: CT HEAD FINDINGS Brain: No evidence of acute infarction,  hemorrhage, hydrocephalus, extra-axial collection or mass lesion/mass effect. Extensive periventricular and deep white matter hypodensity. Mild global cerebral volume loss. Vascular: No hyperdense vessel or unexpected calcification. Skull: Normal. Negative for fracture or focal lesion. Sinuses/Orbits: No acute finding. Other: None. CT CERVICAL SPINE FINDINGS Alignment: Degenerative and postoperative straightening of the normal cervical lordosis. Skull base and vertebrae: No acute fracture. No primary bone lesion or focal pathologic process. Soft tissues and spinal canal: No prevertebral fluid or swelling. No visible canal hematoma. Disc levels: Status post anterior cervical discectomy and fusion of C4-C6, additional ankylosis of C3-C4 and C6-C7. Moderate disc space height loss and osteophytosis of the remaining anatomic cervical levels. Upper chest: Negative. Other: None. IMPRESSION: 1. No acute intracranial pathology. Advanced small-vessel white matter disease and global cerebral volume loss. 2. No fracture or static subluxation of the cervical spine. 3. Status post anterior cervical discectomy and fusion of C4-C6, additional ankylosis of C3-C4 and C6-C7. Moderate disc space height loss and osteophytosis of the remaining anatomic cervical levels. Electronically Signed   By: Jearld Lesch M.D.   On: 06/15/2023 21:46   CT CERVICAL SPINE WO CONTRAST  Result Date: 06/15/2023 CLINICAL DATA:  Fall EXAM: CT HEAD WITHOUT CONTRAST CT CERVICAL SPINE WITHOUT CONTRAST TECHNIQUE: Multidetector CT imaging of the head and cervical spine was performed following the standard protocol without intravenous contrast. Multiplanar CT image reconstructions of the cervical spine were also generated. RADIATION DOSE REDUCTION: This exam was performed according to the departmental dose-optimization program which includes automated exposure control, adjustment of the mA and/or kV according to patient size and/or use of iterative  reconstruction technique. COMPARISON:  04/16/2023 FINDINGS: CT HEAD FINDINGS Brain: No evidence of acute infarction, hemorrhage, hydrocephalus, extra-axial collection or mass lesion/mass effect. Extensive periventricular and deep white matter hypodensity. Mild global cerebral volume loss. Vascular: No hyperdense vessel or unexpected calcification. Skull: Normal. Negative for fracture or focal lesion. Sinuses/Orbits: No acute finding. Other: None. CT CERVICAL SPINE FINDINGS Alignment: Degenerative and postoperative straightening of the normal cervical lordosis. Skull base and vertebrae: No acute fracture. No primary bone lesion or focal pathologic process. Soft tissues and spinal canal: No prevertebral fluid or swelling. No visible canal hematoma. Disc levels: Status post anterior cervical discectomy and fusion of C4-C6, additional ankylosis of C3-C4 and C6-C7. Moderate disc space height loss and osteophytosis of the remaining anatomic cervical levels. Upper chest: Negative. Other: None. IMPRESSION: 1. No acute intracranial pathology. Advanced small-vessel white matter disease and global cerebral volume loss. 2. No fracture or static subluxation of the cervical spine. 3. Status post anterior cervical discectomy and fusion of C4-C6, additional ankylosis of C3-C4 and C6-C7. Moderate disc space height loss and osteophytosis of the remaining anatomic cervical levels. Electronically Signed   By: Jearld Lesch M.D.   On: 06/15/2023 21:46    Assessment/Plan  1. 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 Hyponatremia Improved to 141 from 131 on 7/31  3.  Tremor Mild and not affecting ADls Continue to monitor for possible drug s/e At this time the benefit he receives from the medication outweighs the risk of the drug.    Due to the complex  drug regimen he is on will recheck CMP CBC At next blood draw.   Total time :  time greater than 50% of total time spent doing pt counseling and coordination of care

## 2023-07-19 DIAGNOSIS — I4891 Unspecified atrial fibrillation: Secondary | ICD-10-CM | POA: Diagnosis not present

## 2023-07-19 DIAGNOSIS — Z79899 Other long term (current) drug therapy: Secondary | ICD-10-CM | POA: Diagnosis not present

## 2023-07-19 LAB — HEPATIC FUNCTION PANEL
ALT: 11 U/L (ref 10–40)
AST: 18 (ref 14–40)
Alkaline Phosphatase: 81 (ref 25–125)
Bilirubin, Total: 0.3

## 2023-07-19 LAB — BASIC METABOLIC PANEL
BUN: 12 (ref 4–21)
CO2: 22 (ref 13–22)
Chloride: 102 (ref 99–108)
Creatinine: 0.8 (ref 0.6–1.3)
Glucose: 143
Potassium: 3.8 meq/L (ref 3.5–5.1)
Sodium: 142 (ref 137–147)

## 2023-07-19 LAB — COMPREHENSIVE METABOLIC PANEL
Albumin: 3.6 (ref 3.5–5.0)
Calcium: 8.6 — AB (ref 8.7–10.7)
Globulin: 2.5
eGFR: 85

## 2023-07-19 LAB — CBC AND DIFFERENTIAL
HCT: 35 — AB (ref 41–53)
Hemoglobin: 11.9 — AB (ref 13.5–17.5)
Platelets: 326 10*3/uL (ref 150–400)
WBC: 7.9

## 2023-07-19 LAB — CBC: RBC: 3.7 — AB (ref 3.87–5.11)

## 2023-07-22 ENCOUNTER — Other Ambulatory Visit: Payer: Self-pay | Admitting: Adult Health

## 2023-07-22 DIAGNOSIS — F02B3 Dementia in other diseases classified elsewhere, moderate, with mood disturbance: Secondary | ICD-10-CM

## 2023-07-22 DIAGNOSIS — F4323 Adjustment disorder with mixed anxiety and depressed mood: Secondary | ICD-10-CM | POA: Diagnosis not present

## 2023-07-22 MED ORDER — LORAZEPAM 0.5 MG PO TABS
0.5000 mg | ORAL_TABLET | Freq: Two times a day (BID) | ORAL | 3 refills | Status: DC
Start: 2023-07-22 — End: 2023-11-07

## 2023-08-03 DIAGNOSIS — L309 Dermatitis, unspecified: Secondary | ICD-10-CM | POA: Diagnosis not present

## 2023-08-08 ENCOUNTER — Encounter: Payer: Self-pay | Admitting: Internal Medicine

## 2023-08-08 DIAGNOSIS — M5030 Other cervical disc degeneration, unspecified cervical region: Secondary | ICD-10-CM | POA: Diagnosis not present

## 2023-08-08 DIAGNOSIS — M25562 Pain in left knee: Secondary | ICD-10-CM | POA: Diagnosis not present

## 2023-08-08 DIAGNOSIS — M0609 Rheumatoid arthritis without rheumatoid factor, multiple sites: Secondary | ICD-10-CM | POA: Diagnosis not present

## 2023-08-15 ENCOUNTER — Encounter: Payer: Self-pay | Admitting: Internal Medicine

## 2023-08-15 ENCOUNTER — Non-Acute Institutional Stay (SKILLED_NURSING_FACILITY): Payer: Self-pay | Admitting: Internal Medicine

## 2023-08-15 DIAGNOSIS — M069 Rheumatoid arthritis, unspecified: Secondary | ICD-10-CM

## 2023-08-15 DIAGNOSIS — E782 Mixed hyperlipidemia: Secondary | ICD-10-CM

## 2023-08-15 DIAGNOSIS — I2510 Atherosclerotic heart disease of native coronary artery without angina pectoris: Secondary | ICD-10-CM

## 2023-08-15 DIAGNOSIS — S32599A Other specified fracture of unspecified pubis, initial encounter for closed fracture: Secondary | ICD-10-CM | POA: Diagnosis not present

## 2023-08-15 DIAGNOSIS — F3341 Major depressive disorder, recurrent, in partial remission: Secondary | ICD-10-CM

## 2023-08-15 DIAGNOSIS — G4733 Obstructive sleep apnea (adult) (pediatric): Secondary | ICD-10-CM | POA: Diagnosis not present

## 2023-08-15 DIAGNOSIS — I10 Essential (primary) hypertension: Secondary | ICD-10-CM | POA: Diagnosis not present

## 2023-08-15 DIAGNOSIS — K219 Gastro-esophageal reflux disease without esophagitis: Secondary | ICD-10-CM | POA: Diagnosis not present

## 2023-08-15 DIAGNOSIS — F02B3 Dementia in other diseases classified elsewhere, moderate, with mood disturbance: Secondary | ICD-10-CM

## 2023-08-15 DIAGNOSIS — I480 Paroxysmal atrial fibrillation: Secondary | ICD-10-CM | POA: Diagnosis not present

## 2023-08-15 DIAGNOSIS — G308 Other Alzheimer's disease: Secondary | ICD-10-CM | POA: Diagnosis not present

## 2023-08-15 NOTE — Progress Notes (Signed)
Location:  [facility] Room Number: 306A Place of Service:  SNF [phone]) Provider:   [name], MD   [name], MD  Patient Care Team: [name], MD as PCP - General (Internal Medicine) Quintella Reichert, MD as PCP - Cardiology (Cardiology) Hillis Range, MD (Inactive) as PCP - Electrophysiology (Cardiology) Hilarie Fredrickson, MD (Gastroenterology) Hillis Range, MD (Inactive) (Cardiology) Donnetta Hail, MD as Consulting Physician (Rheumatology) Berneice Heinrich Delbert Phenix., MD as Consulting Physician (Urology) Antony Contras, MD as Consulting Physician (Ophthalmology) Dannielle Burn (Dentistry) Eileen Stanford, MD as Referring Physician (Allergy and Immunology) Van Clines, MD as Consulting Physician (Neurology) Kathyrn Sheriff, [facility] (Inactive) as Pharmacist (Pharmacist) Van Clines, MD as Consulting Physician (Neurology)  Extended Emergency Contact Information Primary Emergency Contact: Archibald,[name] Address: [address]          [place], [place] Home Phone: [phone] Work Phone: [phone] Mobile Phone: [phone] Relation: Spouse Secondary Emergency Contact: [name],[name] Mobile Phone: [phone] Relation: Daughter  Code Status:  DNR Goals of care: Advanced Directive information    08/15/2023   10:15 AM  Advanced Directives  Does Patient Have a Medical Advance Directive? Yes  Type of Advance Directive Living will;Out of facility DNR (pink MOST or yellow form)  Does patient want to make changes to medical advance directive? No - Patient declined     Chief Complaint  Patient presents with   Medical Management of Chronic Issues    Patent is being seen for a routine visit    Immunizations    Patient is due for a covid vaccine     HPI:  Pt is a 89 y.o. male seen today for medical management of chronic diseases.    Lives [facility] memory Unit in [place]  Patient has h/o Dementia with  behavior issues, Rheumatoid Arthritis, A Fib, HTN, CAD s/p PCI, GERD and Sleep apnea And Depression   SDH due to Fall in 11/23  Sustained Right Acetabular Fracture and Right Pubic Rami fracture ORIF per Dr handy in 03/29/23  Dementia with severe behavior issues Now controlled on Rexulti,Haldol and Ativan per Dr Donell Beers Per nurses now his behaviors are more controlled  Still gets  upset and hits staff when providing him care He stays in his Supportive chair No Falls recently Has lost 10 lbs since my last visit in 04/2023   Past Medical History:  Diagnosis Date   CAD (coronary artery disease)    a. s/p inferior STEMI 01/08/2014; LHC 01/08/14: total RCA occlusion s/p 3.5x43mm Xience DES distal RCA and 3.5x28 mm DES mid RCA, 60-70% mid LAD stenosis, EF 55%.   Complication of anesthesia    'long to wake up after back surgery " 09/2003   Diverticulosis of colon    GERD (gastroesophageal reflux disease)    History of cellulitis    05-26-2015  LLE   History of colon polyps    1998- benign/  2008 adenomatous    History of kidney stones    2013   History of squamous cell carcinoma excision    2013;  2015;  06-12-2015 right leg/  02/ and 05/ 2017  left ear and left leg   Hydronephrosis, left    Hyperlipidemia    Hypertension    Migraine with aura    OSA on CPAP 06/19/2015   Moderate OSA with AHI 18/hr  per study 05-20-2015   Osteoarthritis    Paroxysmal atrial fibrillation (HCC) 4/16  chads2vasc score is at least 4   Premature atrial contractions    RA (rheumatoid arthritis) [facility])    rheumatologist-  dr Alben Deeds   Sinusitis, chronic 10/17/2015   Wears glasses    Wears hearing aid    bilateral   Past Surgical History:  Procedure Laterality Date   BACK SURGERY     CARDIOVASCULAR STRESS TEST  11/21/2015   Low risk nuclear study w/ a small diaphragmatic attenuation artifact, no ischemia/  normal LV function and wall motion , ef 63%   CATARACT EXTRACTION W/ INTRAOCULAR LENS   IMPLANT, BILATERAL  2015   COLONOSCOPY  last one 06-09-2011   CORONARY ANGIOPLASTY     CYSTOSCOPY W/ RETROGRADES Left 07/12/2016   Procedure: CYSTOSCOPY WITH RETROGRADE PYELOGRAM LEFT URETERAL STENT;  Surgeon: Bjorn Pippin, MD;  Location: [facility];  Service: Urology;  Laterality: Left;   CYSTOSCOPY WITH RETROGRADE PYELOGRAM, URETEROSCOPY AND STENT PLACEMENT Left 08/11/2016   Procedure: CYSTOSCOPY WITH RETROGRADE PYELOGRAM,  DIAGNOSTIC URETEROSCOPY , STENT EXCHANGE;  Surgeon: Sebastian Ache, MD;  Location: [facility];  Service: Urology;  Laterality: Left;   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   severe fibromatosis palm and fingers   LEFT HEART CATHETERIZATION WITH CORONARY ANGIOGRAM N/A 01/08/2014   Procedure: LEFT HEART CATHETERIZATION WITH CORONARY ANGIOGRAM;  Surgeon: Lennette Bihari, MD;  Location: [facility] CATH LAB;  Service: Cardiovascular;  Laterality:N/A;  total/ subtotal RCA/  mLAD 60-70% w/ mid systolic bridging/  preserved global LVF, ef 55%   MOHS SURGERY  x2  feb and may 2017   left ear /  left leg  ([facility])   ORIF ACETABULAR FRACTURE Right 03/29/2023   Procedure: OPEN REDUCTION INTERNAL FIXATION (ORIF) ACETABULAR FRACTURE;  Surgeon: Myrene Galas, MD;  Location: [facility] OR;  Service: Orthopedics;  Laterality: Right;   PERCUTANEOUS CORONARY STENT INTERVENTION (PCI-S)  01/08/2014   Procedure: PERCUTANEOUS CORONARY STENT INTERVENTION (PCI-S);  Surgeon: Lennette Bihari, MD;  Location: [facility] CATH LAB;  Service: Cardiovascular;;  DES to mid and distal RCA   POSTERIOR LUMBAR FUSION  10/01/2003   and Laminectomy/ diskectomy  L4 -- S1   ROBOT ASSISTED PYELOPLASTY Left 09/15/2016   Procedure: XI ROBOTIC ASSISTED PYELOPLASTY;  Surgeon: Sebastian Ache, MD;  Location: [facility];  Service: Urology;  Laterality: Left;   TONSILLECTOMY     TRANSTHORACIC ECHOCARDIOGRAM  02/23/2016   mild LVH,  ef 50-55%,  grade 1 diastolic dysfunction/  mild to moderate AV calcification w/ no stenosis or regurg./   trivial MR and TR/  mild PR    Allergies  Allergen Reactions   Methotrexate Other (See Comments)    REACTION: tachycardia   Aricept [Donepezil Hcl] Other (See Comments)    Nightmares. Pt takes this medication per [facility]    Crestor [Rosuvastatin Calcium] Other (See Comments)    Myalgias with 20mg  dose   Lisinopril Other (See Comments)    cough   Namenda [Memantine] Diarrhea and Nausea Only   Atorvastatin Other (See Comments)    Muscle pain, leg cramps   Depakote [Divalproex Sodium] Rash   Pravastatin Sodium Other (See Comments)    REACTION: cramps, fatigue    Outpatient Encounter Medications as of 08/15/2023  Medication Sig   acetaminophen (TYLENOL) 325 MG tablet Take 2 tablets (650 mg total) by mouth in the morning and at bedtime.   acetaminophen (TYLENOL) 325 MG tablet Take 650 mg by mouth 3 (three) times daily as needed.   apixaban (ELIQUIS) 5 MG TABS tablet Take 1 tablet (  5 mg total) by mouth 2 (two) times daily.   brexpiprazole (REXULTI) 1 MG TABS tablet Take 3 mg by mouth at bedtime. 9 pm   camphor-menthol (SARNA) lotion Apply 1 Application topically 2 (two) times daily as needed for itching.   docusate sodium (COLACE) 100 MG capsule Take 1 capsule (100 mg total) by mouth 2 (two) times daily.   escitalopram (LEXAPRO) 20 MG tablet Take 20 mg by mouth daily.   Evolocumab (REPATHA SURECLICK) 140 MG/ML SOAJ INJECT 1 PEN INTO SKIN EVERY 14 DAYS   ezetimibe (ZETIA) 10 MG tablet Take 5 mg by mouth daily.   feeding supplement (ENSURE ENLIVE / ENSURE PLUS) LIQD Take 237 mLs by mouth 3 (three) times daily between meals.   haloperidol (HALDOL) 2 MG tablet Take 2 mg by mouth at bedtime.   hydrOXYzine (ATARAX) 50 MG tablet Take 50 mg by mouth 2 (two) times daily.   LORazepam (ATIVAN) 0.5 MG tablet Take 1 tablet (0.5 mg total) by mouth 2 (two) times daily.   melatonin 5 MG TABS Take 5 mg by mouth at bedtime.   methocarbamol (ROBAXIN) 500 MG tablet Take 1 tablet (500 mg total) by mouth every 6  (six) hours as needed for muscle spasms.   metoprolol tartrate (LOPRESSOR) 25 MG tablet Take 25 mg by mouth 2 (two) times daily. Hold for SBP <100   Multiple Vitamin (MULTIVITAMIN WITH MINERALS) TABS tablet Take 1 tablet by mouth daily.   nitroGLYCERIN (NITROSTAT) 0.4 MG SL tablet Place 0.4 mg under the tongue every 5 (five) minutes as needed for chest pain.   omeprazole (PRILOSEC) 40 MG capsule Take 40 mg by mouth in the morning. Take on empty stomach 30 minutes before breakfast.   rosuvastatin (CRESTOR) 5 MG tablet Take 5 mg by mouth every morning.   triamcinolone cream (KENALOG) 0.1 % Apply 1 Application topically 2 (two) times daily.   donepezil (ARICEPT) 5 MG tablet TAKE ONE TABLET BY MOUTH EVERY MORNING (Patient not taking: Reported on 08/15/2023)   [DISCONTINUED] apixaban (ELIQUIS) 2.5 MG TABS tablet Take 1 tablet (2.5 mg total) by mouth 2 (two) times daily.   [DISCONTINUED] haloperidol (HALDOL) 1 MG tablet Take 1 mg by mouth at bedtime as needed.   [DISCONTINUED] haloperidol (HALDOL) 1 MG tablet Take 2 mg by mouth daily. In the afternoon   No facility-administered encounter medications on file as of 08/15/2023.    Review of Systems  Unable to perform ROS: Dementia    Immunization History  Administered Date(s) Administered   Fluad Quad(high Dose 65+) 07/09/2019, 08/15/2020, 09/16/2021, 07/29/2022   Influenza Split 08/10/2011, 08/15/2012   Influenza Whole 08/21/2009, 09/22/2010   Influenza, High Dose Seasonal PF 09/16/2015, 08/18/2016, 08/31/2017, 08/14/2018   Influenza,inj,Quad PF,6+ Mos 08/06/2013   Influenza-Unspecified 08/31/2017   PFIZER(Purple Top)SARS-COV-2 Vaccination 12/11/2019, 01/01/2020, 08/12/2020   Pfizer Covid-19 Vaccine Bivalent Booster 57yrs & up 03/11/2023   Pneumococcal Conjugate-13 10/02/2013   Pneumococcal Polysaccharide-23 06/04/2010   Tdap 10/22/2011, 09/22/2022   Zoster Recombinant(Shingrix) 03/09/2017, 05/10/2017   Pertinent  Health Maintenance Due   Topic Date Due   INFLUENZA VACCINE  06/16/2023      11/01/2022    2:52 PM 11/02/2022    9:04 AM 11/30/2022    9:57 AM 05/03/2023    4:08 PM 08/15/2023   10:15 AM  Fall Risk  Falls in the past year? 1 1 1 1  0  Was there an injury with Fall? 1 1 1  0 0  Fall Risk Category Calculator 3 3 2  2 0  Fall Risk Category (Retired) [employer]     (RETIRED) Patient Fall Risk Level High fall risk High fall risk     Patient at Risk for Falls Due to  History of fall(s);Impaired balance/gait;Impaired mobility No Fall Risks  No Fall Risks  Fall risk Follow up Falls evaluation completed Falls evaluation completed Falls evaluation completed Falls evaluation completed Falls evaluation completed   Functional Status Survey:    Vitals:   08/15/23 1001  BP: 118/70  Pulse: 73  Resp: 18  Temp: 97.8 F (36.6 C)  TempSrc: Temporal  SpO2: 96%  Weight: 182 lb (82.6 kg)  Height: 6' (1.829 m)   Body mass index is 24.68 kg/m. Physical Exam Vitals reviewed.  Constitutional:      Appearance: Normal appearance.  HENT:     Head: Normocephalic.     Nose: Nose normal.     Mouth/Throat:     Mouth: Mucous membranes are moist.     Pharynx: Oropharynx is clear.  Eyes:     Pupils: Pupils are equal, round, and reactive to light.  Cardiovascular:     Rate and Rhythm: Normal rate and regular rhythm.     Pulses: Normal pulses.     Heart sounds: No murmur heard. Pulmonary:     Effort: Pulmonary effort is normal. No respiratory distress.     Breath sounds: Normal breath sounds. No rales.  Abdominal:     General: Abdomen is flat. Bowel sounds are normal.     Palpations: Abdomen is soft.  Musculoskeletal:        General: No swelling.     Cervical back: Neck supple.  Skin:    General: Skin is warm.  Neurological:     General: No focal deficit present.     Mental Status: He is alert.  Psychiatric:        Mood and Affect: Mood normal.        Thought Content: Thought content normal.     Labs  reviewed: Recent Labs    03/29/23 0942 03/29/23 1340 03/30/23 0633 03/31/23 0219 04/05/23 0000 04/16/23 1002 05/09/23 0000 05/13/23 0000 05/16/23 0000 06/15/23 2048 06/15/23 2050  NA 137   < > 137 135   < > 138   < > 130* 131* 138 141  K 3.6   < > 4.0 3.5   < > 3.9   < > 3.7 3.9 3.4* 3.6  CL 104   < > 104 102   < > 105   < > 96* 96* 106 105  CO2 25  --  25 23   < > 27   < > 19 22 24   --   GLUCOSE 112*   < > 148* 112*  --  104*  --   --   --  106* 105*  BUN 19   < > 16 20   < > 13   < > 14 12 12 13   CREATININE 0.91   < > 0.88 0.77   < > 0.87   < > 0.6 0.7 0.85 0.80  CALCIUM 8.7*  --  8.2* 8.3*   < > 8.7*  --  8.8 9.1 8.6*  --   MG 2.1  --  2.0 2.1  --   --   --   --   --   --   --    < > = values in this interval not displayed.   Recent Labs    12/26/22 1414  01/26/23 0000 03/28/23 0815 05/09/23 0000 06/15/23 2048  AST 30 19 17   --  17  ALT 12 12 16   --  16  ALKPHOS 85 79 56  --  67  BILITOT 1.0  --  0.6  --  0.3  PROT 7.8  --  6.8  --  6.4*  ALBUMIN 3.8 3.8 3.6 3.7 3.1*   Recent Labs    12/26/22 1414 03/28/23 0815 03/31/23 0219 04/05/23 0000 04/16/23 1002 05/09/23 0000 06/15/23 2048 06/15/23 2050  WBC 8.1   < > 7.5   < > 8.0 6.5 6.6  --   NEUTROABS 6.2  --   --   --  5.6  --   --   --   HGB 12.7*   < > 9.4*   < > 10.9* 12.2* 11.8* 12.2*  HCT 38.3*   < > 27.9*   < > 34.3* 37* 36.3* 36.0*  MCV 97.0   < > 93.6  --  98.0  --  95.3  --   PLT 272   < > 221   < > 466* 350 316  --    < > = values in this interval not displayed.   Lab Results  Component Value Date   TSH 0.79 01/26/2023   Lab Results  Component Value Date   HGBA1C 6.4 08/30/2017   Lab Results  Component Value Date   CHOL 126 01/26/2023   HDL 36 01/26/2023   LDLCALC 39 01/26/2023   LDLDIRECT 168.9 05/24/2013   TRIG 254 (A) 01/26/2023   CHOLHDL 4.2 08/04/2020    Significant Diagnostic Results in last 30 days:  No results found.  Assessment/Plan 1. Paroxysmal atrial fibrillation  (HCC) Elquis and Metoprolol  2. Primary hypertension Stable on Metoprolol  3. Rheumatoid arthritis involving elbow, unspecified laterality, unspecified whether rheumatoid factor present (HCC) Just seen Rheumatology Follows Annually  4. Mixed hyperlipidemia On Repatha and Crestor LDL 39 in 03/24 5. Closed stable fracture of multiple pubic rami (HCC) Doe snto ambulate now without Assist  6. Coronary artery disease involving native coronary artery of native heart without angina pectoris On aspirin , Statin   7. 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 Gastroesophageal reflux disease without esophagitis Prilosec  9. OSA (obstructive sleep apnea) Does not use CPAP anymore  10. Recurrent major depressive disorder, in partial remission (HCC) Lexapro     Family/ staff Communication:   Labs/tests ordered:

## 2023-08-15 NOTE — Progress Notes (Signed)
A user error has taken place.

## 2023-08-18 ENCOUNTER — Non-Acute Institutional Stay (INDEPENDENT_AMBULATORY_CARE_PROVIDER_SITE_OTHER): Payer: Medicare Other | Admitting: Adult Health

## 2023-08-18 ENCOUNTER — Encounter: Payer: Self-pay | Admitting: Adult Health

## 2023-08-18 DIAGNOSIS — Z0000 Encounter for general adult medical examination without abnormal findings: Secondary | ICD-10-CM

## 2023-08-18 NOTE — Progress Notes (Signed)
Subjective:   [name] is a 89 y.o. male who presents for Medicare Annual/Subsequent preventive examination at [facility].   Visit Complete: In person  Patient Medicare AWV questionnaire was completed by the patient on 08/18/23; I have confirmed that all information answered by patient is correct and no changes since this date.        Objective:    Today's Vitals   08/18/23 1624 08/18/23 1625  Weight: 177 lb 9.6 oz (80.6 kg)   PainSc:  6    Body mass index is 24.09 kg/m.     08/15/2023   10:15 AM 06/09/2023   10:29 AM 05/10/2023    2:19 PM 05/09/2023    9:45 AM 05/03/2023    4:08 PM 04/18/2023    9:09 AM 04/16/2023    9:36 AM  Advanced Directives  Does Patient Have a Medical Advance Directive? Yes Yes Yes Yes Yes Yes Yes  Type of Advance Directive Living will;Out of facility DNR (pink MOST or yellow form) Healthcare Power of [place];Living will;Out of facility DNR (pink MOST or yellow form) Healthcare Power of [place];Living will;Out of facility DNR (pink MOST or yellow form) Healthcare Power of [place];Living will;Out of facility DNR (pink MOST or yellow form) Healthcare Power of [employer] of [place];Living will;Out of facility DNR (pink MOST or yellow form) Out of facility DNR (pink MOST or yellow form)  Does patient want to make changes to medical advance directive? No - Patient declined No - Patient declined No - Patient declined No - Patient declined No - Patient declined No - Patient declined No - Patient declined  Copy of Healthcare Power of Attorney in Chart?  Yes - validated most recent copy scanned in chart (See row information) Yes - validated most recent copy scanned in chart (See row information) Yes - validated most recent copy scanned in chart (See row information) No - copy requested Yes - validated most recent copy scanned in chart (See row information)   Pre-existing out of facility DNR order (yellow form or pink  MOST form)  Yellow form placed in chart (order not valid for inpatient use)  Yellow form placed in chart (order not valid for inpatient use)  Yellow form placed in chart (order not valid for inpatient use) Pink MOST/Yellow Form most recent copy in chart - Physician notified to receive inpatient order    Current Medications (verified) Outpatient Encounter Medications as of 08/18/2023  Medication Sig   acetaminophen (TYLENOL) 325 MG tablet Take 2 tablets (650 mg total) by mouth in the morning and at bedtime.   acetaminophen (TYLENOL) 325 MG tablet Take 650 mg by mouth 3 (three) times daily as needed.   apixaban (ELIQUIS) 5 MG TABS tablet Take 1 tablet (5 mg total) by mouth 2 (two) times daily.   brexpiprazole (REXULTI) 1 MG TABS tablet Take 3 mg by mouth at bedtime. 9 pm   camphor-menthol (SARNA) lotion Apply 1 Application topically 2 (two) times daily as needed for itching.   docusate sodium (COLACE) 100 MG capsule Take 1 capsule (100 mg total) by mouth 2 (two) times daily.   escitalopram (LEXAPRO) 20 MG tablet Take 20 mg by mouth daily.   Evolocumab (REPATHA SURECLICK) 140 MG/ML SOAJ INJECT 1 PEN INTO SKIN EVERY 14 DAYS   ezetimibe (ZETIA) 10 MG tablet Take 5 mg by mouth daily.   feeding supplement (ENSURE ENLIVE / ENSURE PLUS) LIQD Take 237 mLs by mouth 3 (three) times daily between  meals.   haloperidol (HALDOL) 2 MG tablet Take 2 mg by mouth at bedtime.   hydrOXYzine (ATARAX) 50 MG tablet Take 50 mg by mouth 2 (two) times daily.   LORazepam (ATIVAN) 0.5 MG tablet Take 1 tablet (0.5 mg total) by mouth 2 (two) times daily.   melatonin 5 MG TABS Take 5 mg by mouth at bedtime.   methocarbamol (ROBAXIN) 500 MG tablet Take 1 tablet (500 mg total) by mouth every 6 (six) hours as needed for muscle spasms.   metoprolol tartrate (LOPRESSOR) 25 MG tablet Take 25 mg by mouth 2 (two) times daily. Hold for SBP <100   Multiple Vitamin (MULTIVITAMIN WITH MINERALS) TABS tablet Take 1 tablet by mouth daily.    nitroGLYCERIN (NITROSTAT) 0.4 MG SL tablet Place 0.4 mg under the tongue every 5 (five) minutes as needed for chest pain.   omeprazole (PRILOSEC) 40 MG capsule Take 40 mg by mouth in the morning. Take on empty stomach 30 minutes before breakfast.   rosuvastatin (CRESTOR) 5 MG tablet Take 5 mg by mouth every morning.   triamcinolone cream (KENALOG) 0.1 % Apply 1 Application topically 2 (two) times daily.   [DISCONTINUED] apixaban (ELIQUIS) 2.5 MG TABS tablet Take 1 tablet (2.5 mg total) by mouth 2 (two) times daily.   No facility-administered encounter medications on file as of 08/18/2023.    Allergies (verified) Methotrexate, Aricept [donepezil hcl], Crestor [rosuvastatin calcium], Lisinopril, Namenda [memantine], Atorvastatin, Depakote [divalproex sodium], and Pravastatin sodium   History: Past Medical History:  Diagnosis Date   CAD (coronary artery disease)    a. s/p inferior STEMI 01/08/2014; LHC 01/08/14: total RCA occlusion s/p 3.5x70mm Xience DES distal RCA and 3.5x28 mm DES mid RCA, 60-70% mid LAD stenosis, EF 55%.   Complication of anesthesia    'long to wake up after back surgery " 09/2003   Diverticulosis of colon    GERD (gastroesophageal reflux disease)    History of cellulitis    05-26-2015  LLE   History of colon polyps    1998- benign/  2008 adenomatous    History of kidney stones    2013   History of squamous cell carcinoma excision    2013;  2015;  06-12-2015 right leg/  02/ and 05/ 2017  left ear and left leg   Hydronephrosis, left    Hyperlipidemia    Hypertension    Migraine with aura    OSA on CPAP 06/19/2015   Moderate OSA with AHI 18/hr  per study 05-20-2015   Osteoarthritis    Paroxysmal atrial fibrillation (HCC) 4/16   chads2vasc score is at least 4   Premature atrial contractions    RA (rheumatoid arthritis) [facility])    rheumatologist-  dr Alben Deeds   Sinusitis, chronic 10/17/2015   Wears glasses    Wears hearing aid    bilateral   Past Surgical  History:  Procedure Laterality Date   BACK SURGERY     CARDIOVASCULAR STRESS TEST  11/21/2015   Low risk nuclear study w/ a small diaphragmatic attenuation artifact, no ischemia/  normal LV function and wall motion , ef 63%   CATARACT EXTRACTION W/ INTRAOCULAR LENS  IMPLANT, BILATERAL  2015   COLONOSCOPY  last one 06-09-2011   CORONARY ANGIOPLASTY     CYSTOSCOPY W/ RETROGRADES Left 07/12/2016   Procedure: CYSTOSCOPY WITH RETROGRADE PYELOGRAM LEFT URETERAL STENT;  Surgeon: Bjorn Pippin, MD;  Location: [facility];  Service: Urology;  Laterality: Left;   CYSTOSCOPY WITH RETROGRADE PYELOGRAM, URETEROSCOPY AND  STENT PLACEMENT Left 08/11/2016   Procedure: CYSTOSCOPY WITH RETROGRADE PYELOGRAM,  DIAGNOSTIC URETEROSCOPY , STENT EXCHANGE;  Surgeon: Sebastian Ache, MD;  Location: [facility];  Service: Urology;  Laterality: Left;   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   severe fibromatosis palm and fingers   LEFT HEART CATHETERIZATION WITH CORONARY ANGIOGRAM N/A 01/08/2014   Procedure: LEFT HEART CATHETERIZATION WITH CORONARY ANGIOGRAM;  Surgeon: Lennette Bihari, MD;  Location: [facility] CATH LAB;  Service: Cardiovascular;  Laterality:N/A;  total/ subtotal RCA/  mLAD 60-70% w/ mid systolic bridging/  preserved global LVF, ef 55%   MOHS SURGERY  x2  feb and may 2017   left ear /  left leg  ([facility])   ORIF ACETABULAR FRACTURE Right 03/29/2023   Procedure: OPEN REDUCTION INTERNAL FIXATION (ORIF) ACETABULAR FRACTURE;  Surgeon: Myrene Galas, MD;  Location: [facility];  Service: Orthopedics;  Laterality: Right;   PERCUTANEOUS CORONARY STENT INTERVENTION (PCI-S)  01/08/2014   Procedure: PERCUTANEOUS CORONARY STENT INTERVENTION (PCI-S);  Surgeon: Lennette Bihari, MD;  Location: [facility] C[facility] LAB;  Service: Cardiovascular;;  DES to mid and distal RCA   POSTERIOR LUMBAR FUSION  10/01/2003   and Laminectomy/ diskectomy  L4 -- S1   ROBOT ASSISTED PYELOPLASTY Left 09/15/2016   Procedure: XI ROBOTIC ASSISTED PYELOPLASTY;   Surgeon: Sebastian Ache, MD;  Location: [facility];  Service: Urology;  Laterality: Left;   TONSILLECTOMY     TRANSTHORACIC ECHOCARDIOGRAM  02/23/2016   mild LVH,  ef 50-55%,  grade 1 diastolic dysfunction/  mild to moderate AV calcification w/ no stenosis or regurg./  trivial MR and TR/  mild PR   Family History  Problem Relation Age of Onset   Hypertension Mother    Heart disease Father    Colon cancer Neg Hx    Social History   Socioeconomic History   Marital status: Married    Spouse name: Not on file   Number of children: 2   Years of education: Not on file   Highest education level: Not on file  Occupational History   Occupation: Retired    Associate Professor: RETIRED  Tobacco Use   Smoking status: Former    Current packs/day: 0.00    Types: Cigarettes    Start date: 08/09/1958    Quit date: 08/09/1968    Years since quitting: 55.0   Smokeless tobacco: Never  Vaping Use   Vaping status: Never Used  Substance and Sexual Activity   Alcohol use: Not Currently    Alcohol/week: 2.0 standard drinks of alcohol    Types: 2 Shots of liquor per week    Comment: daily   Drug use: No   Sexual activity: Yes    Partners: Female  Other Topics Concern   Not on file  Social History Narrative   1 Caffeine drink daily    Right handed    house   Lives with wife   retired      Chief Executive Officer Determinants of Health   Financial Resource Strain: Low Risk  (06/25/2022)   Overall Financial Resource Strain (CARDIA)    Difficulty of Paying Living Expenses: Not hard at all  Food Insecurity: No Food Insecurity (03/28/2023)   Hunger Vital Sign    Worried About Running Out of Food in the Last Year: Never true    Ran Out of Food in the Last Year: Never true  Transportation Needs: No Transportation Needs (03/28/2023)   PRAPARE - Administrator, Civil Service (Medical): No  Lack of Transportation (Non-Medical): No  Physical Activity: Insufficiently Active (06/25/2022)   Exercise Vital Sign     Days of Exercise per Week: 3 days    Minutes of Exercise per Session: 30 min  Stress: No Stress Concern Present (06/25/2022)   [employer] of Occupational Health - Occupational Stress Questionnaire    Feeling of Stress : Not at all  Social Connections: Moderately Integrated (06/25/2022)   Social Connection and Isolation Panel [NHANES]    Frequency of Communication with Friends and Family: Three times a week    Frequency of Social Gatherings with Friends and Family: Three times a week    Attends Religious Services: More than 4 times per year    Active Member of Clubs or Organizations: No    Attends Banker Meetings: Never    Marital Status: Married    Tobacco Counseling Counseling given: Not Answered   Clinical Intake:  Pre-visit preparation completed: No  Pain : 0-10 Pain Score: 6  Pain Type: Chronic pain Pain Location: Leg Pain Orientation: Right Pain Descriptors / Indicators: Aching Pain Onset: 1 to 4 weeks ago Pain Frequency: Occasional Pain Relieving Factors: resting  Pain Relieving Factors: resting  BMI - recorded: 24.09 Nutritional Status: BMI of 19-24  Normal Nutritional Risks: None Diabetes: No  How often do you need to have someone help you when you read instructions, pamphlets, or other written materials from your doctor or pharmacy?: 5 - Always What is the last grade level you completed in school?: college  Interpreter Needed?: No  Information entered by :: Fletcher Anon NP   Activities of Daily Living    03/28/2023    3:00 PM  In your present state of health, do you have any difficulty performing the following activities:  Hearing? 0  Vision? 0  Difficulty concentrating or making decisions? 1  Doing errands, shopping? 1    Patient Care Team: [name], MD as PCP - General (Internal Medicine) Quintella Reichert, MD as PCP - Cardiology (Cardiology) Hillis Range, MD (Inactive) as PCP - Electrophysiology (Cardiology) Hilarie Fredrickson, MD (Gastroenterology) Hillis Range, MD (Inactive) (Cardiology) Donnetta Hail, MD as Consulting Physician (Rheumatology) Berneice Heinrich Delbert Phenix., MD as Consulting Physician (Urology) Antony Contras, MD as Consulting Physician (Ophthalmology) Dannielle Burn (Dentistry) Eileen Stanford, MD as Referring Physician (Allergy and Immunology) Van Clines, MD as Consulting Physician (Neurology) Kathyrn Sheriff, [facility] (Inactive) as Pharmacist (Pharmacist) Van Clines, MD as Consulting Physician (Neurology)  Indicate any recent Medical Services you may have received from other than [facility] providers in the past year (date may be approximate).     Assessment:   This is a routine wellness examination for [place].  Hearing/Vision screen No results found.   Goals Addressed             This Visit's Progress    Behavior Symptoms Management       Evidence-based guidance:  Assess for behavior changes by interviewing patient and family/caregiver (informant) using a standardized tool when possible.  Assess for signs/symptoms of underlying condition such as urinary tract infection, unmanaged pain or side effects of pharmacologic therapy when new or worsening agitation appears.  Identify triggers or exacerbating factors such as environmental changes, medication, depression or psychosis.  If behavior is dangerous to patient or family/caregiver and cannot be controlled, consider pharmacologic management, professional in-home care or hospitalization.  Prepare patient and family/caregiver for time-limited pharmacologic therapy that is prescribed only when behavior symptoms are severe  and after careful assessment of the risks, benefits and type of dementia.  Monitor efficacy of pharmacologic therapy that may include antipsychotic for aggression, psychosis and agitation or SSRI (selective serotonin reuptake inhibitor) for mood or depressive symptoms.  When pharmacologic therapy has been tapered, assess  symptoms monthly for at least 4 months after discontinuation to identify recurrence of symptoms.  Use nonpharmacologic approaches to manage behavior including referral to mental health provider for behavior therapy and psychoeducation for patient and caregiver.  Encourage use of complementary therapy directed at behavior and mood management such as aromatherapy, muscle-relaxation training, massage or therapeutic touch, animal-assisted therapy and music therapy.  When pain is present, mutually develop an interdisciplinary and multimodal pain management plan with patient and family/caregiver; track the patient's response to the pain management plan.  Initiate individualized nonpharmacologic pain management measures that may include physical rehabilitation, cognitive behavior therapy, guided imagery, massage, distraction, yoga, relaxation or chiropractic manipulation.  Encourage use of local anesthetic or analgesic therapy as an adjunct for pain control such as lidocaine patch, capsaicin cream or topical nonsteroidal anti-inflammatory agent.  Prepare patient for use of pharmacologic therapy in a stepped approach that may include acetaminophen, nonsteroidal anti-inflammatory, opioid, antiepileptic or antidepressant.  Review efficacy, tolerability and adherence of pharmacologic therapy; manage medication-induced side effects.   Notes:        Depression Screen    08/18/2023    4:26 PM 08/15/2023   10:15 AM 11/02/2022    9:04 AM 10/12/2022    9:33 AM 10/05/2022    3:40 PM 06/25/2022    9:45 AM 06/25/2022    9:43 AM  PHQ 2/9 Scores  PHQ - 2 Score 0 0 0 0 0 0 0    Fall Risk    08/18/2023    4:26 PM 08/15/2023   10:15 AM 05/03/2023    4:08 PM 11/30/2022    9:57 AM 11/02/2022    9:04 AM  Fall Risk   Falls in the past year? 1 0 1 1 1   Number falls in past yr: 1 0 1 0 1  Injury with Fall? 1 0 0 1 1  Risk for fall due to : History of fall(s) No Fall Risks  No Fall Risks History of fall(s);Impaired  balance/gait;Impaired mobility  Follow up Falls evaluation completed Falls evaluation completed Falls evaluation completed Falls evaluation completed Falls evaluation completed    MEDICARE RISK AT HOME: Medicare Risk at Home Any stairs in or around the home?: No If so, are there any without handrails?: No Home free of loose throw rugs in walkways, pet beds, electrical cords, etc?: Yes Adequate lighting in your home to reduce risk of falls?: Yes Life alert?: No Use of a cane, walker or w/c?: Yes Grab bars in the bathroom?: Yes Shower chair or bench in shower?: Yes Elevated toilet seat or a handicapped toilet?: Yes  TIMED UP AND GO:  Was the test performed?  No    Cognitive Function:    08/18/2023    2:43 PM 11/01/2022    3:00 PM 10/05/2021   11:00 AM 04/03/2021   10:00 AM 11/22/2017    9:49 AM  MMSE - Mini Mental State Exam  Orientation to time 0 0 2 1 5   Orientation to Place 3 4 3 4 5   Registration 3 3 3 3 3   Attention/ Calculation 4 4 1 4 5   Recall 0 2 0 0 1  Language- name 2 objects 2 2 2 2 2   Language- repeat 1 1  1 1 1   Language- follow 3 step command 3 3 3 3 3   Language- read & follow direction 1 1 1 1 1   Write a sentence 0 1 1 1 1   Copy design 0 1 0 1 1  Total score 17 22 17 21 28       03/10/2020   10:00 AM 04/16/2019   10:00 AM  Montreal Cognitive Assessment   Visuospatial/ Executive (0/5) 1 3  Naming (0/3) 3 3  Attention: Read list of digits (0/2) 2 2  Attention: Read list of letters (0/1) 1 1  Attention: Serial 7 subtraction starting at 100 (0/3) 3 3  Language: Repeat phrase (0/2) 1 1  Language : Fluency (0/1) 1 1  Abstraction (0/2) 1 2  Delayed Recall (0/5) 0 2  Orientation (0/6) 3 6  Total 16 24  Adjusted Score (based on education) 16       06/25/2022    9:46 AM 06/11/2020   10:18 AM  6CIT Screen  What Year? 0 points 0 points  What month? 0 points 0 points  What time? 0 points 0 points  Count back from 20 0 points 0 points  Months in reverse 0  points 0 points  Repeat phrase 0 points 0 points  Total Score 0 points 0 points    Immunizations Immunization History  Administered Date(s) Administered   Fluad Quad(high Dose 65+) 07/09/2019, 08/15/2020, 09/16/2021, 07/29/2022   Influenza Split 08/10/2011, 08/15/2012   Influenza Whole 08/21/2009, 09/22/2010   Influenza, High Dose Seasonal PF 09/16/2015, 08/18/2016, 08/31/2017, 08/14/2018   Influenza,inj,Quad PF,6+ Mos 08/06/2013   Influenza-Unspecified 08/31/2017   PFIZER(Purple Top)SARS-COV-2 Vaccination 12/11/2019, 01/01/2020, 08/12/2020   Pfizer Covid-19 Vaccine Bivalent Booster 63yrs & up 03/11/2023   Pneumococcal Conjugate-13 10/02/2013   Pneumococcal Polysaccharide-23 06/04/2010   Tdap 10/22/2011, 09/22/2022   Zoster Recombinant(Shingrix) 03/09/2017, 05/10/2017    TDAP status: Up to date  Flu Vaccine status: Due, Education has been provided regarding the importance of this vaccine. Advised may receive this vaccine at local pharmacy or Health Dept. Aware to provide a copy of the vaccination record if obtained from local pharmacy or Health Dept. Verbalized acceptance and understanding.  Pneumococcal vaccine status: Up to date  Covid-19 vaccine status: Information provided on how to obtain vaccines.   Qualifies for Shingles Vaccine? Yes   Zostavax completed No   Shingrix Completed?: Yes  Screening Tests Health Maintenance  Topic Date Due   INFLUENZA VACCINE  06/16/2023   Medicare Annual Wellness (AWV)  06/26/2023   COVID-19 Vaccine (5 - 2023-24 season) 08/31/2023 (Originally 07/17/2023)   DTaP/Tdap/Td (3 - Td or Tdap) 09/22/2032   Pneumonia Vaccine 40+ Years old  Completed   Zoster Vaccines- Shingrix  Completed   HPV VACCINES  Aged Out    Health Maintenance  Health Maintenance Due  Topic Date Due   INFLUENZA VACCINE  06/16/2023   Medicare Annual Wellness (AWV)  06/26/2023    Colorectal cancer screening: No longer required.   Lung Cancer Screening: (Low Dose  CT Chest recommended if Age 63-80 years, 20 pack-year currently smoking OR have quit w/in 15years.) does not qualify.   Lung Cancer Screening Referral: NA  Additional Screening:  Hepatitis C Screening: does not qualify; Completed NA  Vision Screening: Recommended annual ophthalmology exams for early detection of glaucoma and other disorders of the eye. Is the patient up to date with their annual eye exam?  No  Who is the provider or what is the name of the office in  which the patient attends annual eye exams? NA, pt has had issues with behaviors and would have a difficult time having an exam due to combativeness If pt is not established with a provider, would they like to be referred to a provider to establish care? No .   Dental Screening: Recommended annual dental exams for proper oral hygiene  Diabetic Foot Exam: NA  Community Resource Referral / Chronic Care Management: CRR required this visit?  No   CCM required this visit?  No     Plan:     I have personally reviewed and noted the following in the patient's chart:   Medical and social history Use of alcohol, tobacco or illicit drugs  Current medications and supplements including opioid prescriptions. Patient is not currently taking opioid prescriptions. Functional ability and status Nutritional status Physical activity Advanced directives List of other physicians Hospitalizations, surgeries, and ER visits in previous 12 months Vitals Screenings to include cognitive, depression, and falls Referrals and appointments  In addition, I have reviewed and discussed with patient certain preventive protocols, quality metrics, and best practice recommendations. A written personalized care plan for preventive services as well as general preventive health recommendations were provided to patient.     Fletcher Anon, NP   08/18/2023   After Visit Summary:   Nurse Notes: NA      Subjective:   [name] is a 89 y.o.  male who presents for Medicare Annual/Subsequent preventive examination.  Visit Complete: In person  Patient Medicare AWV questionnaire was completed by the patient on 08/18/23; I have confirmed that all information answered by patient is correct and no changes since this date.        Objective:    Today's Vitals   08/18/23 1624 08/18/23 1625  Weight: 177 lb 9.6 oz (80.6 kg)   PainSc:  6    Body mass index is 24.09 kg/m.     08/15/2023   10:15 AM 06/09/2023   10:29 AM 05/10/2023    2:19 PM 05/09/2023    9:45 AM 05/03/2023    4:08 PM 04/18/2023    9:09 AM 04/16/2023    9:36 AM  Advanced Directives  Does Patient Have a Medical Advance Directive? Yes Yes Yes Yes Yes Yes Yes  Type of Advance Directive Living will;Out of facility DNR (pink MOST or yellow form) Healthcare Power of [place];Living will;Out of facility DNR (pink MOST or yellow form) Healthcare Power of [place];Living will;Out of facility DNR (pink MOST or yellow form) Healthcare Power of [place];Living will;Out of facility DNR (pink MOST or yellow form) Healthcare Power of [employer] of [place];Living will;Out of facility DNR (pink MOST or yellow form) Out of facility DNR (pink MOST or yellow form)  Does patient want to make changes to medical advance directive? No - Patient declined No - Patient declined No - Patient declined No - Patient declined No - Patient declined No - Patient declined No - Patient declined  Copy of Healthcare Power of Attorney in Chart?  Yes - validated most recent copy scanned in chart (See row information) Yes - validated most recent copy scanned in chart (See row information) Yes - validated most recent copy scanned in chart (See row information) No - copy requested Yes - validated most recent copy scanned in chart (See row information)   Pre-existing out of facility DNR order (yellow form or pink MOST form)  Yellow form placed in chart (order not valid for inpatient use)  Yellow form  placed in chart (order not valid for inpatient use)  Yellow form placed in chart (order not valid for inpatient use) Pink MOST/Yellow Form most recent copy in chart - Physician notified to receive inpatient order    Current Medications (verified) Outpatient Encounter Medications as of 08/18/2023  Medication Sig   acetaminophen (TYLENOL) 325 MG tablet Take 2 tablets (650 mg total) by mouth in the morning and at bedtime.   acetaminophen (TYLENOL) 325 MG tablet Take 650 mg by mouth 3 (three) times daily as needed.   apixaban (ELIQUIS) 5 MG TABS tablet Take 1 tablet (5 mg total) by mouth 2 (two) times daily.   brexpiprazole (REXULTI) 1 MG TABS tablet Take 3 mg by mouth at bedtime. 9 pm   camphor-menthol (SARNA) lotion Apply 1 Application topically 2 (two) times daily as needed for itching.   docusate sodium (COLACE) 100 MG capsule Take 1 capsule (100 mg total) by mouth 2 (two) times daily.   escitalopram (LEXAPRO) 20 MG tablet Take 20 mg by mouth daily.   Evolocumab (REPATHA SURECLICK) 140 MG/ML SOAJ INJECT 1 PEN INTO SKIN EVERY 14 DAYS   ezetimibe (ZETIA) 10 MG tablet Take 5 mg by mouth daily.   feeding supplement (ENSURE ENLIVE / ENSURE PLUS) LIQD Take 237 mLs by mouth 3 (three) times daily between meals.   haloperidol (HALDOL) 2 MG tablet Take 2 mg by mouth at bedtime.   hydrOXYzine (ATARAX) 50 MG tablet Take 50 mg by mouth 2 (two) times daily.   LORazepam (ATIVAN) 0.5 MG tablet Take 1 tablet (0.5 mg total) by mouth 2 (two) times daily.   melatonin 5 MG TABS Take 5 mg by mouth at bedtime.   methocarbamol (ROBAXIN) 500 MG tablet Take 1 tablet (500 mg total) by mouth every 6 (six) hours as needed for muscle spasms.   metoprolol tartrate (LOPRESSOR) 25 MG tablet Take 25 mg by mouth 2 (two) times daily. Hold for SBP <100   Multiple Vitamin (MULTIVITAMIN WITH MINERALS) TABS tablet Take 1 tablet by mouth daily.   nitroGLYCERIN (NITROSTAT) 0.4 MG SL tablet Place 0.4 mg under the tongue every 5 (five)  minutes as needed for chest pain.   omeprazole (PRILOSEC) 40 MG capsule Take 40 mg by mouth in the morning. Take on empty stomach 30 minutes before breakfast.   rosuvastatin (CRESTOR) 5 MG tablet Take 5 mg by mouth every morning.   triamcinolone cream (KENALOG) 0.1 % Apply 1 Application topically 2 (two) times daily.   [DISCONTINUED] apixaban (ELIQUIS) 2.5 MG TABS tablet Take 1 tablet (2.5 mg total) by mouth 2 (two) times daily.   No facility-administered encounter medications on file as of 08/18/2023.    Allergies (verified) Methotrexate, Aricept [donepezil hcl], Crestor [rosuvastatin calcium], Lisinopril, Namenda [memantine], Atorvastatin, Depakote [divalproex sodium], and Pravastatin sodium   History: Past Medical History:  Diagnosis Date   CAD (coronary artery disease)    a. s/p inferior STEMI 01/08/2014; LHC 01/08/14: total RCA occlusion s/p 3.5x22mm Xience DES distal RCA and 3.5x28 mm DES mid RCA, 60-70% mid LAD stenosis, EF 55%.   Complication of anesthesia    'long to wake up after back surgery " 09/2003   Diverticulosis of colon    GERD (gastroesophageal reflux disease)    History of cellulitis    05-26-2015  LLE   History of colon polyps    1998- benign/  2008 adenomatous    History of kidney stones    2013   History of squamous cell carcinoma excision  2013;  2015;  06-12-2015 right leg/  02/ and 05/ 2017  left ear and left leg   Hydronephrosis, left    Hyperlipidemia    Hypertension    Migraine with aura    OSA on CPAP 06/19/2015   Moderate OSA with AHI 18/hr  per study 05-20-2015   Osteoarthritis    Paroxysmal atrial fibrillation (HCC) 4/16   chads2vasc score is at least 4   Premature atrial contractions    RA (rheumatoid arthritis) [facility])    rheumatologist-  dr Alben Deeds   Sinusitis, chronic 10/17/2015   Wears glasses    Wears hearing aid    bilateral   Past Surgical History:  Procedure Laterality Date   BACK SURGERY     CARDIOVASCULAR STRESS TEST   11/21/2015   Low risk nuclear study w/ a small diaphragmatic attenuation artifact, no ischemia/  normal LV function and wall motion , ef 63%   CATARACT EXTRACTION W/ INTRAOCULAR LENS  IMPLANT, BILATERAL  2015   COLONOSCOPY  last one 06-09-2011   CORONARY ANGIOPLASTY     CYSTOSCOPY W/ RETROGRADES Left 07/12/2016   Procedure: CYSTOSCOPY WITH RETROGRADE PYELOGRAM LEFT URETERAL STENT;  Surgeon: Bjorn Pippin, MD;  Location: [facility];  Service: Urology;  Laterality: Left;   CYSTOSCOPY WITH RETROGRADE PYELOGRAM, URETEROSCOPY AND STENT PLACEMENT Left 08/11/2016   Procedure: CYSTOSCOPY WITH RETROGRADE PYELOGRAM,  DIAGNOSTIC URETEROSCOPY , STENT EXCHANGE;  Surgeon: Sebastian Ache, MD;  Location: [facility];  Service: Urology;  Laterality: Left;   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   severe fibromatosis palm and fingers   LEFT HEART CATHETERIZATION WITH CORONARY ANGIOGRAM N/A 01/08/2014   Procedure: LEFT HEART CATHETERIZATION WITH CORONARY ANGIOGRAM;  Surgeon: Lennette Bihari, MD;  Location: [facility] CATH LAB;  Service: Cardiovascular;  Laterality:N/A;  total/ subtotal RCA/  mLAD 60-70% w/ mid systolic bridging/  preserved global LVF, ef 55%   MOHS SURGERY  x2  feb and may 2017   left ear /  left leg  ([facility])   ORIF ACETABULAR FRACTURE Right 03/29/2023   Procedure: OPEN REDUCTION INTERNAL FIXATION (ORIF) ACETABULAR FRACTURE;  Surgeon: Myrene Galas, MD;  Location: [facility] OR;  Service: Orthopedics;  Laterality: Right;   PERCUTANEOUS CORONARY STENT INTERVENTION (PCI-S)  01/08/2014   Procedure: PERCUTANEOUS CORONARY STENT INTERVENTION (PCI-S);  Surgeon: Lennette Bihari, MD;  Location: [facility] CATH LAB;  Service: Cardiovascular;;  DES to mid and distal RCA   POSTERIOR LUMBAR FUSION  10/01/2003   and Laminectomy/ diskectomy  L4 -- S1   ROBOT ASSISTED PYELOPLASTY Left 09/15/2016   Procedure: XI ROBOTIC ASSISTED PYELOPLASTY;  Surgeon: Sebastian Ache, MD;  Location: [facility];  Service: Urology;  Laterality: Left;    TONSILLECTOMY     TRANSTHORACIC ECHOCARDIOGRAM  02/23/2016   mild LVH,  ef 50-55%,  grade 1 diastolic dysfunction/  mild to moderate AV calcification w/ no stenosis or regurg./  trivial MR and TR/  mild PR   Family History  Problem Relation Age of Onset   Hypertension Mother    Heart disease Father    Colon cancer Neg Hx    Social History   Socioeconomic History   Marital status: Married    Spouse name: Not on file   Number of children: 2   Years of education: Not on file   Highest education level: Not on file  Occupational History   Occupation: Retired    Associate Professor: RETIRED  Tobacco Use   Smoking status: Former    Current packs/day: 0.00  Types: Cigarettes    Start date: 08/09/1958    Quit date: 08/09/1968    Years since quitting: 55.0   Smokeless tobacco: Never  Vaping Use   Vaping status: Never Used  Substance and Sexual Activity   Alcohol use: Not Currently    Alcohol/week: 2.0 standard drinks of alcohol    Types: 2 Shots of liquor per week    Comment: daily   Drug use: No   Sexual activity: Yes    Partners: Female  Other Topics Concern   Not on file  Social History Narrative   1 Caffeine drink daily    Right handed    house   Lives with wife   retired      Chief Executive Officer Determinants of Health   Financial Resource Strain: Low Risk  (06/25/2022)   Overall Financial Resource Strain (CARDIA)    Difficulty of Paying Living Expenses: Not hard at all  Food Insecurity: No Food Insecurity (03/28/2023)   Hunger Vital Sign    Worried About Running Out of Food in the Last Year: Never true    Ran Out of Food in the Last Year: Never true  Transportation Needs: No Transportation Needs (03/28/2023)   PRAPARE - Administrator, Civil Service (Medical): No    Lack of Transportation (Non-Medical): No  Physical Activity: Insufficiently Active (06/25/2022)   Exercise Vital Sign    Days of Exercise per Week: 3 days    Minutes of Exercise per Session: 30 min  Stress:  No Stress Concern Present (06/25/2022)   [employer] of Occupational Health - Occupational Stress Questionnaire    Feeling of Stress : Not at all  Social Connections: Moderately Integrated (06/25/2022)   Social Connection and Isolation Panel [NHANES]    Frequency of Communication with Friends and Family: Three times a week    Frequency of Social Gatherings with Friends and Family: Three times a week    Attends Religious Services: More than 4 times per year    Active Member of Clubs or Organizations: No    Attends Banker Meetings: Never    Marital Status: Married    Tobacco Counseling Counseling given: Not Answered   Clinical Intake:  Pre-visit preparation completed: No  Pain : 0-10 Pain Score: 6  Pain Type: Chronic pain Pain Location: Leg Pain Orientation: Right Pain Descriptors / Indicators: Aching Pain Onset: 1 to 4 weeks ago Pain Frequency: Occasional Pain Relieving Factors: resting  Pain Relieving Factors: resting  BMI - recorded: 24.09 Nutritional Status: BMI of 19-24  Normal Nutritional Risks: None Diabetes: No  How often do you need to have someone help you when you read instructions, pamphlets, or other written materials from your doctor or pharmacy?: 5 - Always What is the last grade level you completed in school?: college  Interpreter Needed?: No  Information entered by :: Fletcher Anon NP   Activities of Daily Living    03/28/2023    3:00 PM  In your present state of health, do you have any difficulty performing the following activities:  Hearing? 0  Vision? 0  Difficulty concentrating or making decisions? 1  Doing errands, shopping? 1    Patient Care Team: [name], MD as PCP - General (Internal Medicine) Quintella Reichert, MD as PCP - Cardiology (Cardiology) Hillis Range, MD (Inactive) as PCP - Electrophysiology (Cardiology) Hilarie Fredrickson, MD (Gastroenterology) Hillis Range, MD (Inactive) (Cardiology) Donnetta Hail, MD as Consulting Physician (Rheumatology) Loletta Parish.,  MD as Consulting Physician (Urology) Antony Contras, MD as Consulting Physician (Ophthalmology) Dannielle Burn (Dentistry) Eileen Stanford, MD as Referring Physician (Allergy and Immunology) Van Clines, MD as Consulting Physician (Neurology) Kathyrn Sheriff, [facility] (Inactive) as Pharmacist (Pharmacist) Van Clines, MD as Consulting Physician (Neurology)  Indicate any recent Medical Services you may have received from other than [facility] providers in the past year (date may be approximate).     Assessment:   This is a routine wellness examination for [name].  Hearing/Vision screen No results found.   Goals Addressed             This Visit's Progress    Behavior Symptoms Management       Evidence-based guidance:  Assess for behavior changes by interviewing patient and family/caregiver (informant) using a standardized tool when possible.  Assess for signs/symptoms of underlying condition such as urinary tract infection, unmanaged pain or side effects of pharmacologic therapy when new or worsening agitation appears.  Identify triggers or exacerbating factors such as environmental changes, medication, depression or psychosis.  If behavior is dangerous to patient or family/caregiver and cannot be controlled, consider pharmacologic management, professional in-home care or hospitalization.  Prepare patient and family/caregiver for time-limited pharmacologic therapy that is prescribed only when behavior symptoms are severe and after careful assessment of the risks, benefits and type of dementia.  Monitor efficacy of pharmacologic therapy that may include antipsychotic for aggression, psychosis and agitation or SSRI (selective serotonin reuptake inhibitor) for mood or depressive symptoms.  When pharmacologic therapy has been tapered, assess symptoms monthly for at least 4 months after discontinuation to identify recurrence of  symptoms.  Use nonpharmacologic approaches to manage behavior including referral to mental health provider for behavior therapy and psychoeducation for patient and caregiver.  Encourage use of complementary therapy directed at behavior and mood management such as aromatherapy, muscle-relaxation training, massage or therapeutic touch, animal-assisted therapy and music therapy.  When pain is present, mutually develop an interdisciplinary and multimodal pain management plan with patient and family/caregiver; track the patient's response to the pain management plan.  Initiate individualized nonpharmacologic pain management measures that may include physical rehabilitation, cognitive behavior therapy, guided imagery, massage, distraction, yoga, relaxation or chiropractic manipulation.  Encourage use of local anesthetic or analgesic therapy as an adjunct for pain control such as lidocaine patch, capsaicin cream or topical nonsteroidal anti-inflammatory agent.  Prepare patient for use of pharmacologic therapy in a stepped approach that may include acetaminophen, nonsteroidal anti-inflammatory, opioid, antiepileptic or antidepressant.  Review efficacy, tolerability and adherence of pharmacologic therapy; manage medication-induced side effects.   Notes:       Depression Screen    08/18/2023    4:26 PM 08/15/2023   10:15 AM 11/02/2022    9:04 AM 10/12/2022    9:33 AM 10/05/2022    3:40 PM 06/25/2022    9:45 AM 06/25/2022    9:43 AM  PHQ 2/9 Scores  PHQ - 2 Score 0 0 0 0 0 0 0    Fall Risk    08/18/2023    4:26 PM 08/15/2023   10:15 AM 05/03/2023    4:08 PM 11/30/2022    9:57 AM 11/02/2022    9:04 AM  Fall Risk   Falls in the past year? 1 0 1 1 1   Number falls in past yr: 1 0 1 0 1  Injury with Fall? 1 0 0 1 1  Risk for fall due to : History of fall(s) No Fall Risks  No  Fall Risks History of fall(s);Impaired balance/gait;Impaired mobility  Follow up Falls evaluation completed Falls evaluation  completed Falls evaluation completed Falls evaluation completed Falls evaluation completed    MEDICARE RISK AT HOME: Medicare Risk at Home Any stairs in or around the home?: No If so, are there any without handrails?: No Home free of loose throw rugs in walkways, pet beds, electrical cords, etc?: Yes Adequate lighting in your home to reduce risk of falls?: Yes Life alert?: No Use of a cane, walker or w/c?: Yes Grab bars in the bathroom?: Yes Shower chair or bench in shower?: Yes Elevated toilet seat or a handicapped toilet?: Yes  TIMED UP AND GO:  Was the test performed?  No    Cognitive Function:    08/18/2023    2:43 PM 11/01/2022    3:00 PM 10/05/2021   11:00 AM 04/03/2021   10:00 AM 11/22/2017    9:49 AM  MMSE - Mini Mental State Exam  Orientation to time 0 0 2 1 5   Orientation to Place 3 4 3 4 5   Registration 3 3 3 3 3   Attention/ Calculation 4 4 1 4 5   Recall 0 2 0 0 1  Language- name 2 objects 2 2 2 2 2   Language- repeat 1 1 1 1 1   Language- follow 3 step command 3 3 3 3 3   Language- read & follow direction 1 1 1 1 1   Write a sentence 0 1 1 1 1   Copy design 0 1 0 1 1  Total score 17 22 17 21 28       03/10/2020   10:00 AM 04/16/2019   10:00 AM  Montreal Cognitive Assessment   Visuospatial/ Executive (0/5) 1 3  Naming (0/3) 3 3  Attention: Read list of digits (0/2) 2 2  Attention: Read list of letters (0/1) 1 1  Attention: Serial 7 subtraction starting at 100 (0/3) 3 3  Language: Repeat phrase (0/2) 1 1  Language : Fluency (0/1) 1 1  Abstraction (0/2) 1 2  Delayed Recall (0/5) 0 2  Orientation (0/6) 3 6  Total 16 24  Adjusted Score (based on education) 16       06/25/2022    9:46 AM 06/11/2020   10:18 AM  6CIT Screen  What Year? 0 points 0 points  What month? 0 points 0 points  What time? 0 points 0 points  Count back from 20 0 points 0 points  Months in reverse 0 points 0 points  Repeat phrase 0 points 0 points  Total Score 0 points 0 points     Immunizations Immunization History  Administered Date(s) Administered   Fluad Quad(high Dose 65+) 07/09/2019, 08/15/2020, 09/16/2021, 07/29/2022   Influenza Split 08/10/2011, 08/15/2012   Influenza Whole 08/21/2009, 09/22/2010   Influenza, High Dose Seasonal PF 09/16/2015, 08/18/2016, 08/31/2017, 08/14/2018   Influenza,inj,Quad PF,6+ Mos 08/06/2013   Influenza-Unspecified 08/31/2017   PFIZER(Purple Top)SARS-COV-2 Vaccination 12/11/2019, 01/01/2020, 08/12/2020   Pfizer Covid-19 Vaccine Bivalent Booster 49yrs & up 03/11/2023   Pneumococcal Conjugate-13 10/02/2013   Pneumococcal Polysaccharide-23 06/04/2010   Tdap 10/22/2011, 09/22/2022   Zoster Recombinant(Shingrix) 03/09/2017, 05/10/2017    TDAP status: Up to date  Flu Vaccine status: Due, Education has been provided regarding the importance of this vaccine. Advised may receive this vaccine at local pharmacy or Health Dept. Aware to provide a copy of the vaccination record if obtained from local pharmacy or Health Dept. Verbalized acceptance and understanding.  Pneumococcal vaccine status: Up to date  Covid-19 vaccine status: Information provided on how to obtain vaccines.   Qualifies for Shingles Vaccine? Yes   Zostavax completed No   Shingrix Completed?: Yes  Screening Tests Health Maintenance  Topic Date Due   INFLUENZA VACCINE  06/16/2023   COVID-19 Vaccine (5 - 2023-24 season) 08/31/2023 (Originally 07/17/2023)   Medicare Annual Wellness (AWV)  08/17/2024   DTaP/Tdap/Td (3 - Td or Tdap) 09/22/2032   Pneumonia Vaccine 13+ Years old  Completed   Zoster Vaccines- Shingrix  Completed   HPV VACCINES  Aged Out    Health Maintenance  Health Maintenance Due  Topic Date Due   INFLUENZA VACCINE  06/16/2023    Colorectal cancer screening: No longer required.   Lung Cancer Screening: (Low Dose CT Chest recommended if Age 67-80 years, 20 pack-year currently smoking OR have quit w/in 15years.) does not qualify.   Lung  Cancer Screening Referral: na  Additional Screening:  Hepatitis C Screening: does not qualify; Completed na  Vision Screening: Recommended annual ophthalmology exams for early detection of glaucoma and other disorders of the eye. Is the patient up to date with their annual eye exam?  No  Who is the provider or what is the name of the office in which the patient attends annual eye exams? Due to dementia with behaviors he would not likely benefit for be able to sit for the exam..  If pt is not established with a provider, would they like to be referred to a provider to establish care? No .   Dental Screening: Recommended annual dental exams for proper oral hygiene  Diabetic Foot Exam: NA  Community Resource Referral / Chronic Care Management: CRR required this visit?  No   CCM required this visit?  No     Plan:     I have personally reviewed and noted the following in the patient's chart:   Medical and social history Use of alcohol, tobacco or illicit drugs  Current medications and supplements including opioid prescriptions. Patient is not currently taking opioid prescriptions. Functional ability and status Nutritional status Physical activity Advanced directives List of other physicians Hospitalizations, surgeries, and ER visits in previous 12 months Vitals Screenings to include cognitive, depression, and falls Referrals and appointments  In addition, I have reviewed and discussed with patient certain preventive protocols, quality metrics, and best practice recommendations. A written personalized care plan for preventive services as well as general preventive health recommendations were provided to patient.     Fletcher Anon, NP   08/18/2023   After Visit Summary: [facility]ax[facility] to [facility]  Nurse Notes: NA

## 2023-08-18 NOTE — Patient Instructions (Signed)
[name] , Thank you for taking time to come for your Medicare Wellness Visit. I appreciate your ongoing commitment to your health goals. Please review the following plan we discussed and let me know if I can assist you in the future.   Screening recommendations/referrals: Colonoscopy aged out Recommended yearly ophthalmology/optometry visit for glaucoma screening and checkup Recommended yearly dental visit for hygiene and checkup  Vaccinations: Influenza vaccine due annually in September/October Pneumococcal vaccine up to date  Tdap vaccine up to date Shingles vaccine up to date    Advanced directives: reviewed   Conditions/risks identified: Fall risk   Next appointment: 1 year  Preventive Care 40 Years and Older, Male Preventive care refers to lifestyle choices and visits with your health care provider that can promote health and wellness. What does preventive care include? A yearly physical exam. This is also called an annual well check. Dental exams once or twice a year. Routine eye exams. Ask your health care provider how often you should have your eyes checked. Personal lifestyle choices, including: Daily care of your teeth and gums. Regular physical activity. Eating a healthy diet. Avoiding tobacco and drug use. Limiting alcohol use. Practicing safe sex. Taking low doses of aspirin every day. Taking vitamin and mineral supplements as recommended by your health care provider. What happens during an annual well check? The services and screenings done by your health care provider during your annual well check will depend on your age, overall health, lifestyle risk factors, and family history of disease. Counseling  Your health care provider may ask you questions about your: Alcohol use. Tobacco use. Drug use. Emotional well-being. Home and relationship well-being. Sexual activity. Eating habits. History of falls. Memory and ability to understand (cognition). Work  and work Astronomer. Screening  You may have the following tests or measurements: Height, weight, and BMI. Blood pressure. Lipid and cholesterol levels. These may be checked every 5 years, or more frequently if you are over 26 years old. Skin check. Lung cancer screening. You may have this screening every year starting at age 12 if you have a 30-pack-year history of smoking and currently smoke or have quit within the past 15 years. Fecal occult blood test (FOBT) of the stool. You may have this test every year starting at age 81. Flexible sigmoidoscopy or colonoscopy. You may have a sigmoidoscopy every 5 years or a colonoscopy every 10 years starting at age 72. Prostate cancer screening. Recommendations will vary depending on your family history and other risks. Hepatitis C blood test. Hepatitis B blood test. Sexually transmitted disease (STD) testing. Diabetes screening. This is done by checking your blood sugar (glucose) after you have not eaten for a while (fasting). You may have this done every 1-3 years. Abdominal aortic aneurysm (AAA) screening. You may need this if you are a current or former smoker. Osteoporosis. You may be screened starting at age 58 if you are at high risk. Talk with your health care provider about your test results, treatment options, and if necessary, the need for more tests. Vaccines  Your health care provider may recommend certain vaccines, such as: Influenza vaccine. This is recommended every year. Tetanus, diphtheria, and acellular pertussis (Tdap, Td) vaccine. You may need a Td booster every 10 years. Zoster vaccine. You may need this after age 42. Pneumococcal 13-valent conjugate (PCV13) vaccine. One dose is recommended after age 2. Pneumococcal polysaccharide (PPSV23) vaccine. One dose is recommended after age 8. Talk to your health care provider about which screenings  and vaccines you need and how often you need them. This information is not intended  to replace advice given to you by your health care provider. Make sure you discuss any questions you have with your health care provider. Document Released: 11/28/2015 Document Revised: 07/21/2016 Document Reviewed: 09/02/2015 Elsevier Interactive Patient Education  2017 [employer].  Fall Prevention in the Home Falls can cause injuries. They can happen to people of all ages. There are many things you can do to make your home safe and to help prevent falls. What can I do on the outside of my home? Regularly fix the edges of walkways and driveways and fix any cracks. Remove anything that might make you trip as you walk through a door, such as a raised step or threshold. Trim any bushes or trees on the path to your home. Use bright outdoor lighting. Clear any walking paths of anything that might make someone trip, such as rocks or tools. Regularly check to see if handrails are loose or broken. Make sure that both sides of any steps have handrails. Any raised decks and porches should have guardrails on the edges. Have any leaves, snow, or ice cleared regularly. Use sand or salt on walking paths during winter. Clean up any spills in your garage right away. This includes oil or grease spills. What can I do in the bathroom? Use night lights. Install grab bars by the toilet and in the tub and shower. Do not use towel bars as grab bars. Use non-skid mats or decals in the tub or shower. If you need to sit down in the shower, use a plastic, non-slip stool. Keep the floor dry. Clean up any water that spills on the floor as soon as it happens. Remove soap buildup in the tub or shower regularly. Attach bath mats securely with double-sided non-slip rug tape. Do not have throw rugs and other things on the floor that can make you trip. What can I do in the bedroom? Use night lights. Make sure that you have a light by your bed that is easy to reach. Do not use any sheets or blankets that are too big  for your bed. They should not hang down onto the floor. Have a firm chair that has side arms. You can use this for support while you get dressed. Do not have throw rugs and other things on the floor that can make you trip. What can I do in the kitchen? Clean up any spills right away. Avoid walking on wet floors. Keep items that you use a lot in easy-to-reach places. If you need to reach something above you, use a strong step stool that has a grab bar. Keep electrical cords out of the way. Do not use floor polish or wax that makes floors slippery. If you must use wax, use non-skid floor wax. Do not have throw rugs and other things on the floor that can make you trip. What can I do with my stairs? Do not leave any items on the stairs. Make sure that there are handrails on both sides of the stairs and use them. Fix handrails that are broken or loose. Make sure that handrails are as long as the stairways. Check any carpeting to make sure that it is firmly attached to the stairs. Fix any carpet that is loose or worn. Avoid having throw rugs at the top or bottom of the stairs. If you do have throw rugs, attach them to the floor with carpet tape.  Make sure that you have a light switch at the top of the stairs and the bottom of the stairs. If you do not have them, ask someone to add them for you. What else can I do to help prevent falls? Wear shoes that: Do not have high heels. Have rubber bottoms. Are comfortable and fit you well. Are closed at the toe. Do not wear sandals. If you use a stepladder: Make sure that it is fully opened. Do not climb a closed stepladder. Make sure that both sides of the stepladder are locked into place. Ask someone to hold it for you, if possible. Clearly mark and make sure that you can see: Any grab bars or handrails. First and last steps. Where the edge of each step is. Use tools that help you move around (mobility aids) if they are needed. These  include: Canes. Walkers. Scooters. Crutches. Turn on the lights when you go into a dark area. Replace any light bulbs as soon as they burn out. Set up your furniture so you have a clear path. Avoid moving your furniture around. If any of your floors are uneven, fix them. If there are any pets around you, be aware of where they are. Review your medicines with your doctor. Some medicines can make you feel dizzy. This can increase your chance of falling. Ask your doctor what other things that you can do to help prevent falls. This information is not intended to replace advice given to you by your health care provider. Make sure you discuss any questions you have with your health care provider. Document Released: 08/28/2009 Document Revised: 04/08/2016 Document Reviewed: 12/06/2014 Elsevier Interactive Patient Education  2017 [employer].

## 2023-08-31 DIAGNOSIS — L309 Dermatitis, unspecified: Secondary | ICD-10-CM | POA: Diagnosis not present

## 2023-09-06 DIAGNOSIS — Z23 Encounter for immunization: Secondary | ICD-10-CM | POA: Diagnosis not present

## 2023-09-23 ENCOUNTER — Non-Acute Institutional Stay: Payer: Medicare Other | Admitting: Adult Health

## 2023-09-23 ENCOUNTER — Encounter: Payer: Self-pay | Admitting: Adult Health

## 2023-09-23 DIAGNOSIS — F02C3 Dementia in other diseases classified elsewhere, severe, with mood disturbance: Secondary | ICD-10-CM | POA: Diagnosis not present

## 2023-09-23 DIAGNOSIS — G308 Other Alzheimer's disease: Secondary | ICD-10-CM | POA: Diagnosis not present

## 2023-09-23 DIAGNOSIS — M069 Rheumatoid arthritis, unspecified: Secondary | ICD-10-CM

## 2023-09-23 DIAGNOSIS — E782 Mixed hyperlipidemia: Secondary | ICD-10-CM

## 2023-09-23 DIAGNOSIS — I10 Essential (primary) hypertension: Secondary | ICD-10-CM

## 2023-09-23 DIAGNOSIS — I480 Paroxysmal atrial fibrillation: Secondary | ICD-10-CM

## 2023-09-23 DIAGNOSIS — I2510 Atherosclerotic heart disease of native coronary artery without angina pectoris: Secondary | ICD-10-CM | POA: Diagnosis not present

## 2023-09-23 NOTE — Progress Notes (Unsigned)
Location:  [facility] Room Number: 306A Place of Service:  ALF [phone]) Provider:  Tamsen Roers, MD  Patient Care Team: [name], MD as PCP - General (Internal Medicine) Quintella Reichert, MD as PCP - Cardiology (Cardiology) Hillis Range, MD (Inactive) as PCP - Electrophysiology (Cardiology) Hilarie Fredrickson, MD (Gastroenterology) Hillis Range, MD (Inactive) (Cardiology) Donnetta Hail, MD as Consulting Physician (Rheumatology) Berneice Heinrich Delbert Phenix., MD as Consulting Physician (Urology) Antony Contras, MD as Consulting Physician (Ophthalmology) Dannielle Burn (Dentistry) Eileen Stanford, MD as Referring Physician (Allergy and Immunology) Van Clines, MD as Consulting Physician (Neurology) Kathyrn Sheriff, [facility] (Inactive) as Pharmacist (Pharmacist) Van Clines, MD as Consulting Physician (Neurology)  Extended Emergency Contact Information Primary Emergency Contact: Cantrall,[name] Address: [address]          [place], [place] Home Phone: [phone] Work Phone: [phone] Mobile Phone: [phone] Relation: Spouse Secondary Emergency Contact: [name],[name] Mobile Phone: [phone] Relation: Daughter  Code Status:  DNR Goals of care: Advanced Directive information    09/23/2023    9:36 AM  Advanced Directives  Does Patient Have a Medical Advance Directive? Yes  Type of Advance Directive Living will;Out of facility DNR (pink MOST or yellow form)  Does patient want to make changes to medical advance directive? No - Patient declined     No chief complaint on file.   HPI:  Pt is a 89 y.o. male seen today for medical management of chronic diseases.     Past Medical History:  Diagnosis Date   CAD (coronary artery disease)    a. s/p inferior STEMI 01/08/2014; LHC 01/08/14: total RCA occlusion s/p 3.5x82mm Xience DES distal RCA and 3.5x28 mm DES mid RCA, 60-70% mid LAD  stenosis, EF 55%.   Complication of anesthesia    'long to wake up after back surgery " 09/2003   Diverticulosis of colon    GERD (gastroesophageal reflux disease)    History of cellulitis    05-26-2015  LLE   History of colon polyps    1998- benign/  2008 adenomatous    History of kidney stones    2013   History of squamous cell carcinoma excision    2013;  2015;  06-12-2015 right leg/  02/ and 05/ 2017  left ear and left leg   Hydronephrosis, left    Hyperlipidemia    Hypertension    Migraine with aura    OSA on CPAP 06/19/2015   Moderate OSA with AHI 18/hr  per study 05-20-2015   Osteoarthritis    Paroxysmal atrial fibrillation (HCC) 4/16   chads2vasc score is at least 4   Premature atrial contractions    RA (rheumatoid arthritis) [facility])    rheumatologist-  dr Alben Deeds   Sinusitis, chronic 10/17/2015   Wears glasses    Wears hearing aid    bilateral   Past Surgical History:  Procedure Laterality Date   BACK SURGERY     CARDIOVASCULAR STRESS TEST  11/21/2015   Low risk nuclear study w/ a small diaphragmatic attenuation artifact, no ischemia/  normal LV function and wall motion , ef 63%   CATARACT EXTRACTION W/ INTRAOCULAR LENS  IMPLANT, BILATERAL  2015   COLONOSCOPY  last one 06-09-2011   CORONARY ANGIOPLASTY     CYSTOSCOPY W/ RETROGRADES Left 07/12/2016   Procedure: CYSTOSCOPY WITH RETROGRADE PYELOGRAM LEFT URETERAL STENT;  Surgeon: Bjorn Pippin, MD;  Location: [facility];  Service: Urology;  Laterality: Left;   CYSTOSCOPY WITH RETROGRADE PYELOGRAM, URETEROSCOPY AND STENT PLACEMENT Left 08/11/2016   Procedure: CYSTOSCOPY WITH RETROGRADE PYELOGRAM,  DIAGNOSTIC URETEROSCOPY , STENT EXCHANGE;  Surgeon: Sebastian Ache, MD;  Location: [facility];  Service: Urology;  Laterality: Left;   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   severe fibromatosis palm and fingers   LEFT HEART CATHETERIZATION WITH CORONARY ANGIOGRAM N/A 01/08/2014   Procedure: LEFT HEART  CATHETERIZATION WITH CORONARY ANGIOGRAM;  Surgeon: Lennette Bihari, MD;  Location: [facility] CATH LAB;  Service: Cardiovascular;  Laterality:N/A;  total/ subtotal RCA/  mLAD 60-70% w/ mid systolic bridging/  preserved global LVF, ef 55%   MOHS SURGERY  x2  feb and may 2017   left ear /  left leg  ([facility])   ORIF ACETABULAR FRACTURE Right 03/29/2023   Procedure: OPEN REDUCTION INTERNAL FIXATION (ORIF) ACETABULAR FRACTURE;  Surgeon: Myrene Galas, MD;  Location: [facility];  Service: Orthopedics;  Laterality: Right;   PERCUTANEOUS CORONARY STENT INTERVENTION (PCI-S)  01/08/2014   Procedure: PERCUTANEOUS CORONARY STENT INTERVENTION (PCI-S);  Surgeon: Lennette Bihari, MD;  Location: [facility] CATH LAB;  Service: Cardiovascular;;  DES to mid and distal RCA   POSTERIOR LUMBAR FUSION  10/01/2003   and Laminectomy/ diskectomy  L4 -- S1   ROBOT ASSISTED PYELOPLASTY Left 09/15/2016   Procedure: XI ROBOTIC ASSISTED PYELOPLASTY;  Surgeon: Sebastian Ache, MD;  Location: [facility];  Service: Urology;  Laterality: Left;   TONSILLECTOMY     TRANSTHORACIC ECHOCARDIOGRAM  02/23/2016   mild LVH,  ef 50-55%,  grade 1 diastolic dysfunction/  mild to moderate AV calcification w/ no stenosis or regurg./  trivial MR and TR/  mild PR    Allergies  Allergen Reactions   Methotrexate Other (See Comments)    REACTION: tachycardia   Aricept [Donepezil Hcl] Other (See Comments)    Nightmares. Pt takes this medication per [facility]    Crestor [Rosuvastatin Calcium] Other (See Comments)    Myalgias with 20mg  dose   Lisinopril Other (See Comments)    cough   Namenda [Memantine] Diarrhea and Nausea Only   Atorvastatin Other (See Comments)    Muscle pain, leg cramps   Depakote [Divalproex Sodium] Rash   Pravastatin Sodium Other (See Comments)    REACTION: cramps, fatigue    Outpatient Encounter Medications as of 09/23/2023  Medication Sig   acetaminophen (TYLENOL) 325 MG tablet Take 2 tablets (650 mg total) by mouth in the morning and at bedtime.    acetaminophen (TYLENOL) 325 MG tablet Take 650 mg by mouth 3 (three) times daily as needed.   apixaban (ELIQUIS) 5 MG TABS tablet Take 1 tablet (5 mg total) by mouth 2 (two) times daily.   brexpiprazole (REXULTI) 1 MG TABS tablet Take 3 mg by mouth at bedtime. 9 pm   camphor-menthol (SARNA) lotion Apply 1 Application topically 2 (two) times daily as needed for itching.   docusate sodium (COLACE) 100 MG capsule Take 1 capsule (100 mg total) by mouth 2 (two) times daily.   escitalopram (LEXAPRO) 20 MG tablet Take 20 mg by mouth daily.   Evolocumab (REPATHA SURECLICK) 140 MG/ML SOAJ INJECT 1 PEN INTO SKIN EVERY 14 DAYS   ezetimibe (ZETIA) 10 MG tablet Take 5 mg by mouth daily.   feeding supplement (ENSURE ENLIVE / ENSURE PLUS) LIQD Take 237 mLs by mouth 3 (three) times daily between meals.   haloperidol (HALDOL) 2 MG tablet Take 2 mg by mouth at bedtime.   hydrOXYzine (  ATARAX) 50 MG tablet Take 50 mg by mouth 2 (two) times daily.   LORazepam (ATIVAN) 0.5 MG tablet Take 1 tablet (0.5 mg total) by mouth 2 (two) times daily.   melatonin 5 MG TABS Take 5 mg by mouth at bedtime.   methocarbamol (ROBAXIN) 500 MG tablet Take 1 tablet (500 mg total) by mouth every 6 (six) hours as needed for muscle spasms.   metoprolol tartrate (LOPRESSOR) 25 MG tablet Take 25 mg by mouth 2 (two) times daily. Hold for SBP <100   Multiple Vitamin (MULTIVITAMIN WITH MINERALS) TABS tablet Take 1 tablet by mouth daily.   nitroGLYCERIN (NITROSTAT) 0.4 MG SL tablet Place 0.4 mg under the tongue every 5 (five) minutes as needed for chest pain.   omeprazole (PRILOSEC) 40 MG capsule Take 40 mg by mouth in the morning. Take on empty stomach 30 minutes before breakfast.   rosuvastatin (CRESTOR) 5 MG tablet Take 5 mg by mouth every morning.   triamcinolone cream (KENALOG) 0.1 % Apply 1 Application topically 2 (two) times daily.   [DISCONTINUED] apixaban (ELIQUIS) 2.5 MG TABS tablet Take 1 tablet (2.5 mg total) by mouth 2 (two) times  daily.   No facility-administered encounter medications on file as of 09/23/2023.    Review of Systems  Immunization History  Administered Date(s) Administered   Fluad Quad(high Dose 65+) 07/09/2019, 08/15/2020, 09/16/2021, 07/29/2022, 09/06/2023   Influenza Split 08/10/2011, 08/15/2012   Influenza Whole 08/21/2009, 09/22/2010   Influenza, High Dose Seasonal PF 09/16/2015, 08/18/2016, 08/31/2017, 08/14/2018   Influenza,inj,Quad PF,6+ Mos 08/06/2013   Influenza-Unspecified 08/31/2017   Moderna Covid-19 Vaccine Bivalent Booster 51yrs & up 09/06/2023   PFIZER(Purple Top)SARS-COV-2 Vaccination 12/11/2019, 01/01/2020, 08/12/2020   Pfizer Covid-19 Vaccine Bivalent Booster 26yrs & up 03/11/2023   Pneumococcal Conjugate-13 10/02/2013   Pneumococcal Polysaccharide-23 06/04/2010   Tdap 10/22/2011, 09/22/2022   Zoster Recombinant(Shingrix) 03/09/2017, 05/10/2017   Pertinent  Health Maintenance Due  Topic Date Due   INFLUENZA VACCINE  Completed      11/02/2022    9:04 AM 11/30/2022    9:57 AM 05/03/2023    4:08 PM 08/15/2023   10:15 AM 08/18/2023    4:26 PM  Fall Risk  Falls in the past year? 1 1 1  0 1  Was there an injury with Fall? 1 1 0 0 1  Fall Risk Category Calculator 3 2 2  0 3  Fall Risk Category (Retired) High      (RETIRED) Patient Fall Risk Level High fall risk      Patient at Risk for Falls Due to History of fall(s);Impaired balance/gait;Impaired mobility No Fall Risks  No Fall Risks History of fall(s)  Fall risk Follow up Falls evaluation completed Falls evaluation completed Falls evaluation completed Falls evaluation completed Falls evaluation completed   Functional Status Survey:    There were no vitals filed for this visit. There is no height or weight on file to calculate BMI. Physical Exam  Labs reviewed: Recent Labs    03/29/23 0942 03/29/23 1340 03/30/23 0633 03/31/23 0219 04/05/23 0000 04/16/23 1002 05/09/23 0000 05/13/23 0000 05/16/23 0000  06/15/23 2048 06/15/23 2050  NA 137   < > 137 135   < > 138   < > 130* 131* 138 141  K 3.6   < > 4.0 3.5   < > 3.9   < > 3.7 3.9 3.4* 3.6  CL 104   < > 104 102   < > 105   < > 96* 96* 106 105  CO2 25  --  25 23   < > 27   < > 19 22 24   --   GLUCOSE 112*   < > 148* 112*  --  104*  --   --   --  106* 105*  BUN 19   < > 16 20   < > 13   < > 14 12 12 13   CREATININE 0.91   < > 0.88 0.77   < > 0.87   < > 0.6 0.7 0.85 0.80  CALCIUM 8.7*  --  8.2* 8.3*   < > 8.7*  --  8.8 9.1 8.6*  --   MG 2.1  --  2.0 2.1  --   --   --   --   --   --   --    < > = values in this interval not displayed.   Recent Labs    12/26/22 1414 01/26/23 0000 03/28/23 0815 05/09/23 0000 06/15/23 2048  AST 30 19 17   --  17  ALT 12 12 16   --  16  ALKPHOS 85 79 56  --  67  BILITOT 1.0  --  0.6  --  0.3  PROT 7.8  --  6.8  --  6.4*  ALBUMIN 3.8 3.8 3.6 3.7 3.1*   Recent Labs    12/26/22 1414 03/28/23 0815 03/31/23 0219 04/05/23 0000 04/16/23 1002 05/09/23 0000 06/15/23 2048 06/15/23 2050  WBC 8.1   < > 7.5   < > 8.0 6.5 6.6  --   NEUTROABS 6.2  --   --   --  5.6  --   --   --   HGB 12.7*   < > 9.4*   < > 10.9* 12.2* 11.8* 12.2*  HCT 38.3*   < > 27.9*   < > 34.3* 37* 36.3* 36.0*  MCV 97.0   < > 93.6  --  98.0  --  95.3  --   PLT 272   < > 221   < > 466* 350 316  --    < > = values in this interval not displayed.   Lab Results  Component Value Date   TSH 0.79 01/26/2023   Lab Results  Component Value Date   HGBA1C 6.4 08/30/2017   Lab Results  Component Value Date   CHOL 126 01/26/2023   HDL 36 01/26/2023   LDLCALC 39 01/26/2023   LDLDIRECT 168.9 05/24/2013   TRIG 254 (A) 01/26/2023   CHOLHDL 4.2 08/04/2020    Significant Diagnostic Results in last 30 days:  No results found.  Assessment/Plan There are no diagnoses linked to this encounter.   Family/ staff Communication: ***  Labs/tests ordered:  ***

## 2023-09-24 ENCOUNTER — Encounter: Payer: Self-pay | Admitting: Adult Health

## 2023-09-24 MED ORDER — CETAPHIL MOISTURIZING EX CREA: TOPICAL_CREAM | Freq: Every day | CUTANEOUS | Status: DC

## 2023-09-30 DIAGNOSIS — F4323 Adjustment disorder with mixed anxiety and depressed mood: Secondary | ICD-10-CM | POA: Diagnosis not present

## 2023-10-03 DIAGNOSIS — F02B3 Dementia in other diseases classified elsewhere, moderate, with mood disturbance: Secondary | ICD-10-CM | POA: Diagnosis not present

## 2023-10-03 DIAGNOSIS — R4189 Other symptoms and signs involving cognitive functions and awareness: Secondary | ICD-10-CM | POA: Diagnosis not present

## 2023-10-03 DIAGNOSIS — R2689 Other abnormalities of gait and mobility: Secondary | ICD-10-CM | POA: Diagnosis not present

## 2023-10-03 DIAGNOSIS — R296 Repeated falls: Secondary | ICD-10-CM | POA: Diagnosis not present

## 2023-10-03 DIAGNOSIS — M6281 Muscle weakness (generalized): Secondary | ICD-10-CM | POA: Diagnosis not present

## 2023-10-04 DIAGNOSIS — R296 Repeated falls: Secondary | ICD-10-CM | POA: Diagnosis not present

## 2023-10-04 DIAGNOSIS — M6281 Muscle weakness (generalized): Secondary | ICD-10-CM | POA: Diagnosis not present

## 2023-10-04 DIAGNOSIS — F02B3 Dementia in other diseases classified elsewhere, moderate, with mood disturbance: Secondary | ICD-10-CM | POA: Diagnosis not present

## 2023-10-04 DIAGNOSIS — R2689 Other abnormalities of gait and mobility: Secondary | ICD-10-CM | POA: Diagnosis not present

## 2023-10-04 DIAGNOSIS — R4189 Other symptoms and signs involving cognitive functions and awareness: Secondary | ICD-10-CM | POA: Diagnosis not present

## 2023-10-05 DIAGNOSIS — M6281 Muscle weakness (generalized): Secondary | ICD-10-CM | POA: Diagnosis not present

## 2023-10-05 DIAGNOSIS — R4189 Other symptoms and signs involving cognitive functions and awareness: Secondary | ICD-10-CM | POA: Diagnosis not present

## 2023-10-05 DIAGNOSIS — R296 Repeated falls: Secondary | ICD-10-CM | POA: Diagnosis not present

## 2023-10-05 DIAGNOSIS — F02B3 Dementia in other diseases classified elsewhere, moderate, with mood disturbance: Secondary | ICD-10-CM | POA: Diagnosis not present

## 2023-10-05 DIAGNOSIS — R2689 Other abnormalities of gait and mobility: Secondary | ICD-10-CM | POA: Diagnosis not present

## 2023-10-06 DIAGNOSIS — R296 Repeated falls: Secondary | ICD-10-CM | POA: Diagnosis not present

## 2023-10-06 DIAGNOSIS — F02B3 Dementia in other diseases classified elsewhere, moderate, with mood disturbance: Secondary | ICD-10-CM | POA: Diagnosis not present

## 2023-10-06 DIAGNOSIS — R4189 Other symptoms and signs involving cognitive functions and awareness: Secondary | ICD-10-CM | POA: Diagnosis not present

## 2023-10-06 DIAGNOSIS — M6281 Muscle weakness (generalized): Secondary | ICD-10-CM | POA: Diagnosis not present

## 2023-10-06 DIAGNOSIS — R2689 Other abnormalities of gait and mobility: Secondary | ICD-10-CM | POA: Diagnosis not present

## 2023-10-07 DIAGNOSIS — R4189 Other symptoms and signs involving cognitive functions and awareness: Secondary | ICD-10-CM | POA: Diagnosis not present

## 2023-10-07 DIAGNOSIS — R2689 Other abnormalities of gait and mobility: Secondary | ICD-10-CM | POA: Diagnosis not present

## 2023-10-07 DIAGNOSIS — M6281 Muscle weakness (generalized): Secondary | ICD-10-CM | POA: Diagnosis not present

## 2023-10-07 DIAGNOSIS — F02B3 Dementia in other diseases classified elsewhere, moderate, with mood disturbance: Secondary | ICD-10-CM | POA: Diagnosis not present

## 2023-10-07 DIAGNOSIS — R296 Repeated falls: Secondary | ICD-10-CM | POA: Diagnosis not present

## 2023-10-10 DIAGNOSIS — R296 Repeated falls: Secondary | ICD-10-CM | POA: Diagnosis not present

## 2023-10-10 DIAGNOSIS — R2689 Other abnormalities of gait and mobility: Secondary | ICD-10-CM | POA: Diagnosis not present

## 2023-10-10 DIAGNOSIS — R4189 Other symptoms and signs involving cognitive functions and awareness: Secondary | ICD-10-CM | POA: Diagnosis not present

## 2023-10-10 DIAGNOSIS — F02B3 Dementia in other diseases classified elsewhere, moderate, with mood disturbance: Secondary | ICD-10-CM | POA: Diagnosis not present

## 2023-10-10 DIAGNOSIS — M6281 Muscle weakness (generalized): Secondary | ICD-10-CM | POA: Diagnosis not present

## 2023-10-11 DIAGNOSIS — M6281 Muscle weakness (generalized): Secondary | ICD-10-CM | POA: Diagnosis not present

## 2023-10-11 DIAGNOSIS — R2689 Other abnormalities of gait and mobility: Secondary | ICD-10-CM | POA: Diagnosis not present

## 2023-10-11 DIAGNOSIS — R4189 Other symptoms and signs involving cognitive functions and awareness: Secondary | ICD-10-CM | POA: Diagnosis not present

## 2023-10-11 DIAGNOSIS — F02B3 Dementia in other diseases classified elsewhere, moderate, with mood disturbance: Secondary | ICD-10-CM | POA: Diagnosis not present

## 2023-10-11 DIAGNOSIS — R296 Repeated falls: Secondary | ICD-10-CM | POA: Diagnosis not present

## 2023-10-12 DIAGNOSIS — R296 Repeated falls: Secondary | ICD-10-CM | POA: Diagnosis not present

## 2023-10-12 DIAGNOSIS — M6281 Muscle weakness (generalized): Secondary | ICD-10-CM | POA: Diagnosis not present

## 2023-10-12 DIAGNOSIS — R4189 Other symptoms and signs involving cognitive functions and awareness: Secondary | ICD-10-CM | POA: Diagnosis not present

## 2023-10-12 DIAGNOSIS — R2689 Other abnormalities of gait and mobility: Secondary | ICD-10-CM | POA: Diagnosis not present

## 2023-10-12 DIAGNOSIS — F02B3 Dementia in other diseases classified elsewhere, moderate, with mood disturbance: Secondary | ICD-10-CM | POA: Diagnosis not present

## 2023-10-15 DIAGNOSIS — F02B3 Dementia in other diseases classified elsewhere, moderate, with mood disturbance: Secondary | ICD-10-CM | POA: Diagnosis not present

## 2023-10-15 DIAGNOSIS — R4189 Other symptoms and signs involving cognitive functions and awareness: Secondary | ICD-10-CM | POA: Diagnosis not present

## 2023-10-15 DIAGNOSIS — M6281 Muscle weakness (generalized): Secondary | ICD-10-CM | POA: Diagnosis not present

## 2023-10-15 DIAGNOSIS — R296 Repeated falls: Secondary | ICD-10-CM | POA: Diagnosis not present

## 2023-10-15 DIAGNOSIS — R2689 Other abnormalities of gait and mobility: Secondary | ICD-10-CM | POA: Diagnosis not present

## 2023-10-17 DIAGNOSIS — R4189 Other symptoms and signs involving cognitive functions and awareness: Secondary | ICD-10-CM | POA: Diagnosis not present

## 2023-10-17 DIAGNOSIS — R296 Repeated falls: Secondary | ICD-10-CM | POA: Diagnosis not present

## 2023-10-17 DIAGNOSIS — M6281 Muscle weakness (generalized): Secondary | ICD-10-CM | POA: Diagnosis not present

## 2023-10-17 DIAGNOSIS — R2689 Other abnormalities of gait and mobility: Secondary | ICD-10-CM | POA: Diagnosis not present

## 2023-10-17 DIAGNOSIS — F02B3 Dementia in other diseases classified elsewhere, moderate, with mood disturbance: Secondary | ICD-10-CM | POA: Diagnosis not present

## 2023-10-18 DIAGNOSIS — R4189 Other symptoms and signs involving cognitive functions and awareness: Secondary | ICD-10-CM | POA: Diagnosis not present

## 2023-10-18 DIAGNOSIS — M6281 Muscle weakness (generalized): Secondary | ICD-10-CM | POA: Diagnosis not present

## 2023-10-18 DIAGNOSIS — R2689 Other abnormalities of gait and mobility: Secondary | ICD-10-CM | POA: Diagnosis not present

## 2023-10-18 DIAGNOSIS — R296 Repeated falls: Secondary | ICD-10-CM | POA: Diagnosis not present

## 2023-10-18 DIAGNOSIS — F02B3 Dementia in other diseases classified elsewhere, moderate, with mood disturbance: Secondary | ICD-10-CM | POA: Diagnosis not present

## 2023-10-19 DIAGNOSIS — F02B3 Dementia in other diseases classified elsewhere, moderate, with mood disturbance: Secondary | ICD-10-CM | POA: Diagnosis not present

## 2023-10-19 DIAGNOSIS — R296 Repeated falls: Secondary | ICD-10-CM | POA: Diagnosis not present

## 2023-10-19 DIAGNOSIS — R4189 Other symptoms and signs involving cognitive functions and awareness: Secondary | ICD-10-CM | POA: Diagnosis not present

## 2023-10-19 DIAGNOSIS — R2689 Other abnormalities of gait and mobility: Secondary | ICD-10-CM | POA: Diagnosis not present

## 2023-10-19 DIAGNOSIS — M6281 Muscle weakness (generalized): Secondary | ICD-10-CM | POA: Diagnosis not present

## 2023-10-20 DIAGNOSIS — F02B3 Dementia in other diseases classified elsewhere, moderate, with mood disturbance: Secondary | ICD-10-CM | POA: Diagnosis not present

## 2023-10-20 DIAGNOSIS — R2689 Other abnormalities of gait and mobility: Secondary | ICD-10-CM | POA: Diagnosis not present

## 2023-10-20 DIAGNOSIS — R4189 Other symptoms and signs involving cognitive functions and awareness: Secondary | ICD-10-CM | POA: Diagnosis not present

## 2023-10-20 DIAGNOSIS — M6281 Muscle weakness (generalized): Secondary | ICD-10-CM | POA: Diagnosis not present

## 2023-10-20 DIAGNOSIS — R296 Repeated falls: Secondary | ICD-10-CM | POA: Diagnosis not present

## 2023-10-21 DIAGNOSIS — R4189 Other symptoms and signs involving cognitive functions and awareness: Secondary | ICD-10-CM | POA: Diagnosis not present

## 2023-10-21 DIAGNOSIS — M6281 Muscle weakness (generalized): Secondary | ICD-10-CM | POA: Diagnosis not present

## 2023-10-21 DIAGNOSIS — R296 Repeated falls: Secondary | ICD-10-CM | POA: Diagnosis not present

## 2023-10-21 DIAGNOSIS — R2689 Other abnormalities of gait and mobility: Secondary | ICD-10-CM | POA: Diagnosis not present

## 2023-10-21 DIAGNOSIS — F02B3 Dementia in other diseases classified elsewhere, moderate, with mood disturbance: Secondary | ICD-10-CM | POA: Diagnosis not present

## 2023-10-21 DIAGNOSIS — F4323 Adjustment disorder with mixed anxiety and depressed mood: Secondary | ICD-10-CM | POA: Diagnosis not present

## 2023-10-24 DIAGNOSIS — M6281 Muscle weakness (generalized): Secondary | ICD-10-CM | POA: Diagnosis not present

## 2023-10-24 DIAGNOSIS — R2689 Other abnormalities of gait and mobility: Secondary | ICD-10-CM | POA: Diagnosis not present

## 2023-10-24 DIAGNOSIS — F02B3 Dementia in other diseases classified elsewhere, moderate, with mood disturbance: Secondary | ICD-10-CM | POA: Diagnosis not present

## 2023-10-24 DIAGNOSIS — R4189 Other symptoms and signs involving cognitive functions and awareness: Secondary | ICD-10-CM | POA: Diagnosis not present

## 2023-10-24 DIAGNOSIS — R296 Repeated falls: Secondary | ICD-10-CM | POA: Diagnosis not present

## 2023-10-25 DIAGNOSIS — R2689 Other abnormalities of gait and mobility: Secondary | ICD-10-CM | POA: Diagnosis not present

## 2023-10-25 DIAGNOSIS — R4189 Other symptoms and signs involving cognitive functions and awareness: Secondary | ICD-10-CM | POA: Diagnosis not present

## 2023-10-25 DIAGNOSIS — R296 Repeated falls: Secondary | ICD-10-CM | POA: Diagnosis not present

## 2023-10-25 DIAGNOSIS — F02B3 Dementia in other diseases classified elsewhere, moderate, with mood disturbance: Secondary | ICD-10-CM | POA: Diagnosis not present

## 2023-10-25 DIAGNOSIS — M6281 Muscle weakness (generalized): Secondary | ICD-10-CM | POA: Diagnosis not present

## 2023-10-26 DIAGNOSIS — F02B3 Dementia in other diseases classified elsewhere, moderate, with mood disturbance: Secondary | ICD-10-CM | POA: Diagnosis not present

## 2023-10-26 DIAGNOSIS — M6281 Muscle weakness (generalized): Secondary | ICD-10-CM | POA: Diagnosis not present

## 2023-10-26 DIAGNOSIS — R4189 Other symptoms and signs involving cognitive functions and awareness: Secondary | ICD-10-CM | POA: Diagnosis not present

## 2023-10-26 DIAGNOSIS — R296 Repeated falls: Secondary | ICD-10-CM | POA: Diagnosis not present

## 2023-10-26 DIAGNOSIS — R2689 Other abnormalities of gait and mobility: Secondary | ICD-10-CM | POA: Diagnosis not present

## 2023-10-27 DIAGNOSIS — R2689 Other abnormalities of gait and mobility: Secondary | ICD-10-CM | POA: Diagnosis not present

## 2023-10-27 DIAGNOSIS — F02B3 Dementia in other diseases classified elsewhere, moderate, with mood disturbance: Secondary | ICD-10-CM | POA: Diagnosis not present

## 2023-10-27 DIAGNOSIS — M6281 Muscle weakness (generalized): Secondary | ICD-10-CM | POA: Diagnosis not present

## 2023-10-27 DIAGNOSIS — R296 Repeated falls: Secondary | ICD-10-CM | POA: Diagnosis not present

## 2023-10-27 DIAGNOSIS — R4189 Other symptoms and signs involving cognitive functions and awareness: Secondary | ICD-10-CM | POA: Diagnosis not present

## 2023-10-28 DIAGNOSIS — M6281 Muscle weakness (generalized): Secondary | ICD-10-CM | POA: Diagnosis not present

## 2023-10-28 DIAGNOSIS — R4189 Other symptoms and signs involving cognitive functions and awareness: Secondary | ICD-10-CM | POA: Diagnosis not present

## 2023-10-28 DIAGNOSIS — F02B3 Dementia in other diseases classified elsewhere, moderate, with mood disturbance: Secondary | ICD-10-CM | POA: Diagnosis not present

## 2023-10-28 DIAGNOSIS — R296 Repeated falls: Secondary | ICD-10-CM | POA: Diagnosis not present

## 2023-10-28 DIAGNOSIS — R2689 Other abnormalities of gait and mobility: Secondary | ICD-10-CM | POA: Diagnosis not present

## 2023-11-01 ENCOUNTER — Telehealth (INDEPENDENT_AMBULATORY_CARE_PROVIDER_SITE_OTHER): Payer: Medicare Other | Admitting: Physician Assistant

## 2023-11-01 DIAGNOSIS — F0150 Vascular dementia without behavioral disturbance: Secondary | ICD-10-CM | POA: Diagnosis not present

## 2023-11-01 DIAGNOSIS — I10 Essential (primary) hypertension: Secondary | ICD-10-CM

## 2023-11-01 NOTE — Progress Notes (Signed)
Virtual Visit via Video Note The purpose of this virtual visit is to provide medical care in a patient that is unable to be seen in person due to physical or health limitations   Consent was obtained for video visit:  yes  Answered questions that patient had about telehealth interaction:  yes I discussed the limitations, risks, security and privacy concerns of performing an evaluation and management service by telemedicine. I also discussed with the patient that there may be a patient responsible charge related to this service. The patient expressed understanding and agreed to proceed.  Pt location: Home Physician Location: office Name of referring provider:  [name], MD I connected with [name] at patients initiation/request on 11/01/2023 at 11:30 AM EST by video enabled telemedicine application and verified that I am speaking with the correct person using two identifiers. Pt MRN:  [id number] Pt DOB:  Apr 16, 1933 Video Participants:  [name]; daughter [name] and caregiver [name]     Assessment and Plan:   [name]  a 89 y.o. RH male with a history of  HTN, HLD, CAD, rheumatoid arthritis, GERD, PAF, GAD, depression,  PAF on Eliquis, and  dementia due to Alzheimer's disease with behavioral disturbance and with vascular component seen today in follow up. He is at [facility]. Cognitive decline is noted.  Discussed with his daughter goals of care, and daughter reports that in view of no longer being on any antidementia medications and the facility takes care of all of his other issues including mobility, and mood, no follow-up with neurology is indicated since were not active participants of his care.   History of Present Illness:   Patient accompanied by his daughters. He was last seen on 05/03/23 via video visit   Any changes in memory?Endorsed, worse, declining. More confused than before, he forgets quickly new information .  "Has not lost the sense of humor "-she  says repeats oneself? Endorsed Disoriented when walking into a room? Endorsed, does not remember where he is at times, for example he may think he is in [place] where he grew up. .  Leaving objects in unusual places? Denies  Ambulates with difficulty?  Recent falls? In May 13 he sustained a fall and broke his hip, mechanical, falling out of his bed, no LOC, and another fall on 05/2023, unwitnessed sustaining multiple areas of trauma including bruise at the L frontal area, neg CT head. . HE is wheelchair bound History of seizures? NO  Wandering behavior?  He is wheelchair-bound Any mood changes? Sometimes he cusses at the staff but not frequently, usually worse at dinner time "getting better".  Agitation ?  He has moments of agitation as before.  He is on Rexulti with good response Any history of depression?:  Denies. he is under the care of psychiatry, on Lexapro Rexulti Hallucinations?He looks for his parents, and for his brother frequently.  Paranoia?Once in a while he looks for his wife why she has not been here, where is she? Patient reports that sleeps well without vivid dreams, REM behavior or sleepwalking    Any hygiene concerns? Needs assistance for bathing and changing.  Does the patient needs help with medications? Staff in charge   Any changes in appetite?it is good, loves crackers, goldfish. Patient have trouble swallowing? Any headaches?  Denies No new vision changes. Any stroke like symptoms?  Denies Any tremors? Any incontinence of urine? Uses diapers  Any bowel dysfunction?  Constipation but takes supplements with  good response        History on Initial Assessment 04/11/2019: This is an 89 year old right-handed man with a history of hypertension, hyperlipidemia, OSA on CPAP, CAD, presenting for evaluation of worsening memory. He feels his memory is okay, he gets confused some, "my wife tells me I forget things." He makes jokes several times during the visit. His wife  started noticing changes the beginning of the year. He started having personality changes where he would just explode for no reason. He was started on Lexapro which has significantly helped. She has noticed short term memory issues, and was missing bills that she had to take over in January. He was also starting to miss medications, his wife has started helping. Last night he was very concerned that he had not taken his medications. He has gotten lost driving, last week he could not remember what errand he went to do and was driving around. He could not remember where he would be going, but then figures it out. He repeatedly says he misplaced his hearing aids. His wife was driving yesterday and they brought her car to be serviced, after going to another place, he could not remember that they had brought her car in earlier. He had an unusual episode last week when he came upstairs and told his wife that they had to go and leave now, he was dressed in 2 pairs of boxer shorts and his shirt on backward (unusual, he is usually a fastidious dresser per wife), ready to go somewhere late at night. She found that he had pulled out his clothes and medications and wrapped his medications in his khaki pants. She directed him to bed and he went to sleep. He saw his PCP and had normal bloodwork and urinalysis. I personally reviewed head CT without contrast done 5/26 which did not show any acute changes, there was diffuse atrophy and chronic microvascular disease. His wife notes he is much more alert in the daytime and more confused at night. He was started on Donepezil 5mg  daily, which he is tolerating without side effects. There is no family history of dementia, no history of significant head injuries. He usually has 1-2 alcohol drinks at night.    He denies any headaches, dizziness (except upon standing quickly), diplopia, dysarthria/dysphagia, neck/back pain, focal numbness/tingling/weakness, bowel dysfunction, anosmia. He  has infrequent incontinence. He has had right hand shaking occasionally around 3-4 times a week. He has a history of sleepwalking. His wife has not noticed any staring/unresponsive episodes. No paranoia or hallucinations. He has trouble sleeping and takes 1/2 tablet of Ambien. He has fallen a couple of times, last fall was 3 weeks ago.    CT head  61/24 No evidence of significant acute traumatic injury to the skull,brain or cervical spine.2. Moderate cerebral and mild cerebellar atrophy with extensive chronic microvascular ischemic changes in the cerebral white matter,similar to the prior study, as above. 3. Severe multilevel degenerative disc disease and cervical spondylosis, status post ACDF at C4-C6, as above.     Current Outpatient Medications on File Prior to Visit  Medication Sig Dispense Refill   acetaminophen (TYLENOL) 325 MG tablet Take 2 tablets (650 mg total) by mouth in the morning and at bedtime. 30 tablet 0   acetaminophen (TYLENOL) 325 MG tablet Take 650 mg by mouth 3 (three) times daily as needed.     apixaban (ELIQUIS) 5 MG TABS tablet Take 1 tablet (5 mg total) by mouth 2 (two) times daily. 60  tablet 3   brexpiprazole (REXULTI) 1 MG TABS tablet Take 3 mg by mouth at bedtime. 9 pm     camphor-menthol (SARNA) lotion Apply 1 Application topically 2 (two) times daily as needed for itching.     docusate sodium (COLACE) 100 MG capsule Take 1 capsule (100 mg total) by mouth 2 (two) times daily. 10 capsule 0   Emollient (CETAPHIL) cream Apply topically daily at 6 (six) AM.     escitalopram (LEXAPRO) 20 MG tablet Take 20 mg by mouth daily.     Evolocumab (REPATHA SURECLICK) 140 MG/ML SOAJ INJECT 1 PEN INTO SKIN EVERY 14 DAYS 2 mL 11   ezetimibe (ZETIA) 10 MG tablet Take 5 mg by mouth daily.     haloperidol (HALDOL) 2 MG tablet Take 2 mg by mouth at bedtime.     hydrOXYzine (ATARAX) 50 MG tablet Take 50 mg by mouth 2 (two) times daily.     LORazepam (ATIVAN) 0.5 MG tablet Take 1 tablet  (0.5 mg total) by mouth 2 (two) times daily. 60 tablet 3   melatonin 5 MG TABS Take 5 mg by mouth at bedtime.     methocarbamol (ROBAXIN) 500 MG tablet Take 1 tablet (500 mg total) by mouth every 6 (six) hours as needed for muscle spasms. 40 tablet 0   metoprolol tartrate (LOPRESSOR) 25 MG tablet Take 25 mg by mouth 2 (two) times daily. Hold for SBP <100     Multiple Vitamin (MULTIVITAMIN WITH MINERALS) TABS tablet Take 1 tablet by mouth daily. 30 tablet 0   nitroGLYCERIN (NITROSTAT) 0.4 MG SL tablet Place 0.4 mg under the tongue every 5 (five) minutes as needed for chest pain.     omeprazole (PRILOSEC) 40 MG capsule Take 40 mg by mouth in the morning. Take on empty stomach 30 minutes before breakfast.     rosuvastatin (CRESTOR) 5 MG tablet Take 5 mg by mouth every morning.     triamcinolone cream (KENALOG) 0.1 % Apply 1 Application topically 2 (two) times daily as needed.     [DISCONTINUED] apixaban (ELIQUIS) 2.5 MG TABS tablet Take 1 tablet (2.5 mg total) by mouth 2 (two) times daily. 60 tablet 1   No current facility-administered medications on file prior to visit.     Observations/Objective:   There were no vitals filed for this visit. GEN:  The patient appears stated age and is in NAD.  Neurological examination: Patient is awake, alert, oriented to person, not to place or date . No aphasia or dysarthria. Intact fluency,  decreased  comprehension. Remote and recent memory impaired.Unable to repeat phrases. Cranial nerves: Extraocular movements intact with no nystagmus. No facial asymmetry. Motor: moves all extremities symmetrically, at least anti-gravity x 4. There is some incoordination on finger to nose testing . Gait: Not tested, he is wheelchair-bound     Follow Up Instructions:    -I discussed the assessment and treatment plan with the patient. The patient was provided an opportunity to ask questions and all were answered. The patient agreed with the plan and demonstrated an  understanding of the instructions.   The patient was advised to call back or seek an in-person evaluation if the symptoms worsen or if the condition fails to improve as anticipated.    Total time spent on today's visit was 30 minutes, including both face-to-face time and nonface-to-face time.  Time included that spent on review of records (prior notes available to me/labs/imaging if pertinent), discussing treatment and goals, answering patient's questions and  coordinating care.   Marlowe Kays, PA-C

## 2023-11-02 DIAGNOSIS — L309 Dermatitis, unspecified: Secondary | ICD-10-CM | POA: Diagnosis not present

## 2023-11-03 DIAGNOSIS — F02B3 Dementia in other diseases classified elsewhere, moderate, with mood disturbance: Secondary | ICD-10-CM | POA: Diagnosis not present

## 2023-11-03 DIAGNOSIS — R2689 Other abnormalities of gait and mobility: Secondary | ICD-10-CM | POA: Diagnosis not present

## 2023-11-03 DIAGNOSIS — M6281 Muscle weakness (generalized): Secondary | ICD-10-CM | POA: Diagnosis not present

## 2023-11-03 DIAGNOSIS — R4189 Other symptoms and signs involving cognitive functions and awareness: Secondary | ICD-10-CM | POA: Diagnosis not present

## 2023-11-03 DIAGNOSIS — R296 Repeated falls: Secondary | ICD-10-CM | POA: Diagnosis not present

## 2023-11-05 DIAGNOSIS — R4189 Other symptoms and signs involving cognitive functions and awareness: Secondary | ICD-10-CM | POA: Diagnosis not present

## 2023-11-05 DIAGNOSIS — F02B3 Dementia in other diseases classified elsewhere, moderate, with mood disturbance: Secondary | ICD-10-CM | POA: Diagnosis not present

## 2023-11-05 DIAGNOSIS — M6281 Muscle weakness (generalized): Secondary | ICD-10-CM | POA: Diagnosis not present

## 2023-11-05 DIAGNOSIS — R296 Repeated falls: Secondary | ICD-10-CM | POA: Diagnosis not present

## 2023-11-05 DIAGNOSIS — R2689 Other abnormalities of gait and mobility: Secondary | ICD-10-CM | POA: Diagnosis not present

## 2023-11-07 ENCOUNTER — Other Ambulatory Visit: Payer: Self-pay | Admitting: Internal Medicine

## 2023-11-07 DIAGNOSIS — F02B3 Dementia in other diseases classified elsewhere, moderate, with mood disturbance: Secondary | ICD-10-CM | POA: Diagnosis not present

## 2023-11-07 DIAGNOSIS — M6281 Muscle weakness (generalized): Secondary | ICD-10-CM | POA: Diagnosis not present

## 2023-11-07 DIAGNOSIS — R2689 Other abnormalities of gait and mobility: Secondary | ICD-10-CM | POA: Diagnosis not present

## 2023-11-07 DIAGNOSIS — G308 Other Alzheimer's disease: Secondary | ICD-10-CM

## 2023-11-07 DIAGNOSIS — R4189 Other symptoms and signs involving cognitive functions and awareness: Secondary | ICD-10-CM | POA: Diagnosis not present

## 2023-11-07 DIAGNOSIS — R296 Repeated falls: Secondary | ICD-10-CM | POA: Diagnosis not present

## 2023-11-07 MED ORDER — LORAZEPAM 0.5 MG PO TABS
0.5000 mg | ORAL_TABLET | Freq: Two times a day (BID) | ORAL | 3 refills | Status: DC
Start: 2023-11-07 — End: 2024-01-02

## 2023-11-10 DIAGNOSIS — R2689 Other abnormalities of gait and mobility: Secondary | ICD-10-CM | POA: Diagnosis not present

## 2023-11-10 DIAGNOSIS — R296 Repeated falls: Secondary | ICD-10-CM | POA: Diagnosis not present

## 2023-11-10 DIAGNOSIS — M6281 Muscle weakness (generalized): Secondary | ICD-10-CM | POA: Diagnosis not present

## 2023-11-10 DIAGNOSIS — F02B3 Dementia in other diseases classified elsewhere, moderate, with mood disturbance: Secondary | ICD-10-CM | POA: Diagnosis not present

## 2023-11-10 DIAGNOSIS — R4189 Other symptoms and signs involving cognitive functions and awareness: Secondary | ICD-10-CM | POA: Diagnosis not present

## 2023-11-11 DIAGNOSIS — R296 Repeated falls: Secondary | ICD-10-CM | POA: Diagnosis not present

## 2023-11-11 DIAGNOSIS — R2689 Other abnormalities of gait and mobility: Secondary | ICD-10-CM | POA: Diagnosis not present

## 2023-11-11 DIAGNOSIS — F02B3 Dementia in other diseases classified elsewhere, moderate, with mood disturbance: Secondary | ICD-10-CM | POA: Diagnosis not present

## 2023-11-11 DIAGNOSIS — M6281 Muscle weakness (generalized): Secondary | ICD-10-CM | POA: Diagnosis not present

## 2023-11-11 DIAGNOSIS — R4189 Other symptoms and signs involving cognitive functions and awareness: Secondary | ICD-10-CM | POA: Diagnosis not present

## 2023-11-14 DIAGNOSIS — F02B3 Dementia in other diseases classified elsewhere, moderate, with mood disturbance: Secondary | ICD-10-CM | POA: Diagnosis not present

## 2023-11-14 DIAGNOSIS — M6281 Muscle weakness (generalized): Secondary | ICD-10-CM | POA: Diagnosis not present

## 2023-11-14 DIAGNOSIS — R296 Repeated falls: Secondary | ICD-10-CM | POA: Diagnosis not present

## 2023-11-14 DIAGNOSIS — R4189 Other symptoms and signs involving cognitive functions and awareness: Secondary | ICD-10-CM | POA: Diagnosis not present

## 2023-11-14 DIAGNOSIS — R2689 Other abnormalities of gait and mobility: Secondary | ICD-10-CM | POA: Diagnosis not present

## 2023-11-15 DIAGNOSIS — R2689 Other abnormalities of gait and mobility: Secondary | ICD-10-CM | POA: Diagnosis not present

## 2023-11-15 DIAGNOSIS — R4189 Other symptoms and signs involving cognitive functions and awareness: Secondary | ICD-10-CM | POA: Diagnosis not present

## 2023-11-15 DIAGNOSIS — R296 Repeated falls: Secondary | ICD-10-CM | POA: Diagnosis not present

## 2023-11-15 DIAGNOSIS — F02B3 Dementia in other diseases classified elsewhere, moderate, with mood disturbance: Secondary | ICD-10-CM | POA: Diagnosis not present

## 2023-11-15 DIAGNOSIS — M6281 Muscle weakness (generalized): Secondary | ICD-10-CM | POA: Diagnosis not present

## 2023-11-17 DIAGNOSIS — R296 Repeated falls: Secondary | ICD-10-CM | POA: Diagnosis not present

## 2023-11-17 DIAGNOSIS — M6281 Muscle weakness (generalized): Secondary | ICD-10-CM | POA: Diagnosis not present

## 2023-11-17 DIAGNOSIS — R4189 Other symptoms and signs involving cognitive functions and awareness: Secondary | ICD-10-CM | POA: Diagnosis not present

## 2023-11-17 DIAGNOSIS — F02B3 Dementia in other diseases classified elsewhere, moderate, with mood disturbance: Secondary | ICD-10-CM | POA: Diagnosis not present

## 2023-11-17 DIAGNOSIS — R2689 Other abnormalities of gait and mobility: Secondary | ICD-10-CM | POA: Diagnosis not present

## 2023-11-21 ENCOUNTER — Non-Acute Institutional Stay (SKILLED_NURSING_FACILITY): Payer: Self-pay | Admitting: Internal Medicine

## 2023-11-21 ENCOUNTER — Encounter: Payer: Self-pay | Admitting: Internal Medicine

## 2023-11-21 DIAGNOSIS — E782 Mixed hyperlipidemia: Secondary | ICD-10-CM

## 2023-11-21 DIAGNOSIS — I480 Paroxysmal atrial fibrillation: Secondary | ICD-10-CM

## 2023-11-21 DIAGNOSIS — F3341 Major depressive disorder, recurrent, in partial remission: Secondary | ICD-10-CM

## 2023-11-21 DIAGNOSIS — G308 Other Alzheimer's disease: Secondary | ICD-10-CM

## 2023-11-21 DIAGNOSIS — R296 Repeated falls: Secondary | ICD-10-CM | POA: Diagnosis not present

## 2023-11-21 DIAGNOSIS — M069 Rheumatoid arthritis, unspecified: Secondary | ICD-10-CM | POA: Diagnosis not present

## 2023-11-21 DIAGNOSIS — R2689 Other abnormalities of gait and mobility: Secondary | ICD-10-CM | POA: Diagnosis not present

## 2023-11-21 DIAGNOSIS — R4189 Other symptoms and signs involving cognitive functions and awareness: Secondary | ICD-10-CM | POA: Diagnosis not present

## 2023-11-21 DIAGNOSIS — K219 Gastro-esophageal reflux disease without esophagitis: Secondary | ICD-10-CM | POA: Diagnosis not present

## 2023-11-21 DIAGNOSIS — M6281 Muscle weakness (generalized): Secondary | ICD-10-CM | POA: Diagnosis not present

## 2023-11-21 DIAGNOSIS — F02C3 Dementia in other diseases classified elsewhere, severe, with mood disturbance: Secondary | ICD-10-CM

## 2023-11-21 DIAGNOSIS — I10 Essential (primary) hypertension: Secondary | ICD-10-CM | POA: Diagnosis not present

## 2023-11-21 DIAGNOSIS — F02B3 Dementia in other diseases classified elsewhere, moderate, with mood disturbance: Secondary | ICD-10-CM | POA: Diagnosis not present

## 2023-11-21 NOTE — Progress Notes (Signed)
 Location:  [facility] Illustrator of Service:  SNF ([id number])  Provider:   Code Status: DNR Goals of Care:     11/01/2023   12:07 PM  Advanced Directives  Does Patient Have a Medical Advance Directive? Yes  Type of Advance Directive Healthcare Power of Attorney  Does patient want to make changes to medical advance directive? No - Patient declined  Copy of Healthcare Power of Attorney in Chart? No - copy requested     Chief Complaint  Patient presents with   Care Management    HPI: Patient is a 89 y.o. male seen today for medical management of chronic diseases.    Lives [facility] memory Unit in [place]   Patient has h/o Dementia with behavior issues, Rheumatoid Arthritis, A Fib, HTN, CAD s/p PCI, GERD and Sleep apnea And Depression   SDH due to Fall in 11/23  Sustained Right Acetabular Fracture and Right Pubic Rami fracture ORIF per Dr handy in 03/29/23  Dementia with Behaviors Controlled with Number of Meds Per Dr Tasia He gets upset and hits the staff  Now Working with therapy and per staff tries to walk  Had Fall few days ago Stand up lift As needed Eats by himself Wt Readings from Last 3 Encounters:  11/21/23 178 lb (80.7 kg)  09/24/23 179 lb 4.8 oz (81.3 kg)  08/18/23 177 lb 9.6 oz (80.6 kg)    Has some aphasia Follows commands   Past Medical History:  Diagnosis Date   CAD (coronary artery disease)    a. s/p inferior STEMI 01/08/2014; LHC 01/08/14: total RCA occlusion s/p 3.5x36mm Xience DES distal RCA and 3.5x28 mm DES mid RCA, 60-70% mid LAD stenosis, EF 55%.   Complication of anesthesia    'long to wake up after back surgery  09/2003   Diverticulosis of colon    GERD (gastroesophageal reflux disease)    History of cellulitis    05-26-2015  LLE   History of colon polyps    1998- benign/  2008 adenomatous    History of kidney stones    2013   History of squamous cell carcinoma excision    2013;  2015;  06-12-2015 right leg/  02/ and 05/ 2017   left ear and left leg   Hydronephrosis, left    Hyperlipidemia    Hypertension    Migraine with aura    OSA on CPAP 06/19/2015   Moderate OSA with AHI 18/hr  per study 05-20-2015   Osteoarthritis    Paroxysmal atrial fibrillation (HCC) 4/16   chads2vasc score is at least 4   Premature atrial contractions    RA (rheumatoid arthritis) [facility])    rheumatologist-  dr lynwood ramsay   Sinusitis, chronic 10/17/2015   Wears glasses    Wears hearing aid    bilateral    Past Surgical History:  Procedure Laterality Date   BACK SURGERY     CARDIOVASCULAR STRESS TEST  11/21/2015   Low risk nuclear study w/ a small diaphragmatic attenuation artifact, no ischemia/  normal LV function and wall motion , ef 63%   CATARACT EXTRACTION W/ INTRAOCULAR LENS  IMPLANT, BILATERAL  2015   COLONOSCOPY  last one 06-09-2011   CORONARY ANGIOPLASTY     CYSTOSCOPY W/ RETROGRADES Left 07/12/2016   Procedure: CYSTOSCOPY WITH RETROGRADE PYELOGRAM LEFT URETERAL STENT;  Surgeon: Norleen Seltzer, MD;  Location: [facility];  Service: Urology;  Laterality: Left;   CYSTOSCOPY WITH RETROGRADE PYELOGRAM, URETEROSCOPY AND STENT PLACEMENT Left  08/11/2016   Procedure: CYSTOSCOPY WITH RETROGRADE PYELOGRAM,  DIAGNOSTIC URETEROSCOPY , STENT EXCHANGE;  Surgeon: Ricardo Likens, MD;  Location: [facility];  Service: Urology;  Laterality: Left;   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   severe fibromatosis palm and fingers   LEFT HEART CATHETERIZATION WITH CORONARY ANGIOGRAM N/A 01/08/2014   Procedure: LEFT HEART CATHETERIZATION WITH CORONARY ANGIOGRAM;  Surgeon: Debby DELENA Sor, MD;  Location[facility] C[facility] LAB;  Service: Cardiovascular;  Laterality:N/A;  total/ subtotal RCA/  mLAD 60-70% w/ mid systolic bridging/  preserved global LVF, ef 55%   MOHS SURGERY  x2  feb and may 2017   left ear /  left leg  (S[facility])   ORIF ACETABULAR FRACTURE Right 03/29/2023   Procedure: OPEN REDUCTION INTERNAL FIXATION (ORIF) ACETABULAR FRACTURE;   Surgeon: Celena Sharper, MD;  Location: [facility] OR;  Service: Orthopedics;  Laterality: Right;   PERCUTANEOUS CORONARY STENT INTERVENTION (PCI-S)  01/08/2014   Procedure: PERCUTANEOUS CORONARY STENT INTERVENTION (PCI-S);  Surgeon: Debby DELENA Sor, MD;  Location: [facility] CATH LAB;  Service: Cardiovascular;;  DES to mid and distal RCA   POSTERIOR LUMBAR FUSION  10/01/2003   and Laminectomy/ diskectomy  L4 -- S1   ROBOT ASSISTED PYELOPLASTY Left 09/15/2016   Procedure: XI ROBOTIC ASSISTED PYELOPLASTY;  Surgeon: Ricardo Likens, MD;  Location: [facility];  Service: Urology;  Laterality: Left;   TONSILLECTOMY     TRANSTHORACIC ECHOCARDIOGRAM  02/23/2016   mild LVH,  ef 50-55%,  grade 1 diastolic dysfunction/  mild to moderate AV calcification w/ no stenosis or regurg./  trivial MR and TR/  mild PR    Allergies  Allergen Reactions   Methotrexate Other (See Comments)    REACTION: tachycardia   Aricept  [Donepezil  Hcl] Other (See Comments)    Nightmares. Pt takes this medication per [facility]    Crestor  [Rosuvastatin  Calcium ] Other (See Comments)    Myalgias with 20mg  dose   Lisinopril  Other (See Comments)    cough   Namenda  [Memantine ] Diarrhea and Nausea Only   Atorvastatin  Other (See Comments)    Muscle pain, leg cramps   Depakote  [Divalproex  Sodium] Rash   Pravastatin Sodium Other (See Comments)    REACTION: cramps, fatigue    Outpatient Encounter Medications as of 11/21/2023  Medication Sig   acetaminophen  (TYLENOL ) 325 MG tablet Take 2 tablets (650 mg total) by mouth in the morning and at bedtime.   acetaminophen  (TYLENOL ) 325 MG tablet Take 650 mg by mouth 3 (three) times daily as needed.   apixaban  (ELIQUIS ) 5 MG TABS tablet Take 1 tablet (5 mg total) by mouth 2 (two) times daily.   brexpiprazole  (REXULTI ) 1 MG TABS tablet Take 3 mg by mouth at bedtime. 9 pm   camphor-menthol (SARNA) lotion Apply 1 Application topically 2 (two) times daily as needed for itching.   docusate sodium  (COLACE) 100 MG capsule  Take 1 capsule (100 mg total) by mouth 2 (two) times daily.   Emollient (CETAPHIL) cream Apply topically daily at 6 (six) AM.   escitalopram  (LEXAPRO ) 20 MG tablet Take 20 mg by mouth daily.   Evolocumab  (REPATHA  SURECLICK) 140 MG/ML SOAJ INJECT 1 PEN INTO SKIN EVERY 14 DAYS   ezetimibe  (ZETIA ) 10 MG tablet Take 5 mg by mouth daily.   hydrOXYzine  (ATARAX ) 50 MG tablet Take 50 mg by mouth 2 (two) times daily. At [facility] and 5 pm   LORazepam  (ATIVAN ) 0.5 MG tablet Take 1 tablet (0.5 mg total) by mouth 2 (two) times daily.  melatonin 5 MG TABS Take 5 mg by mouth at bedtime.   methocarbamol  (ROBAXIN ) 500 MG tablet Take 1 tablet (500 mg total) by mouth every 6 (six) hours as needed for muscle spasms.   metoprolol  tartrate (LOPRESSOR ) 25 MG tablet Take 25 mg by mouth 2 (two) times daily. Hold for SBP <100   Multiple Vitamin (MULTIVITAMIN WITH MINERALS) TABS tablet Take 1 tablet by mouth daily.   nitroGLYCERIN  (NITROSTAT ) 0.4 MG SL tablet Place 0.4 mg under the tongue every 5 (five) minutes as needed for chest pain.   omeprazole  (PRILOSEC) 40 MG capsule Take 40 mg by mouth in the morning. Take on empty stomach 30 minutes before breakfast.   rosuvastatin  (CRESTOR ) 5 MG tablet Take 5 mg by mouth every morning.   triamcinolone  cream (KENALOG ) 0.1 % Apply 1 Application topically 2 (two) times daily as needed.   [DISCONTINUED] apixaban  (ELIQUIS ) 2.5 MG TABS tablet Take 1 tablet (2.5 mg total) by mouth 2 (two) times daily.   [DISCONTINUED] haloperidol  (HALDOL ) 2 MG tablet Take 2 mg by mouth daily as needed.   No facility-administered encounter medications on file as of 11/21/2023.    Review of Systems:  Review of Systems  Unable to perform ROS: Dementia    Health Maintenance  Topic Date Due   COVID-19 Vaccine (6 - 2024-25 season) 11/01/2023   Medicare Annual Wellness (AWV)  08/17/2024   DTaP/Tdap/Td (3 - Td or Tdap) 09/22/2032   Pneumonia Vaccine 2+ Years old  Completed   INFLUENZA VACCINE  Completed    Zoster Vaccines- Shingrix  Completed   HPV VACCINES  Aged Out    Physical Exam: Vitals:   11/21/23 1951  BP: 106/69  Pulse: 60  Resp: 20  Temp: (!) 97 F (36.1 C)  Weight: 178 lb (80.7 kg)   Body mass index is 24.14 kg/m. Physical Exam Vitals reviewed.  Constitutional:      Appearance: Normal appearance.  HENT:     Head: Normocephalic.     Nose: Nose normal.     Mouth/Throat:     Mouth: Mucous membranes are moist.     Pharynx: Oropharynx is clear.  Eyes:     Pupils: Pupils are equal, round, and reactive to light.  Cardiovascular:     Rate and Rhythm: Normal rate and regular rhythm.     Pulses: Normal pulses.     Heart sounds: No murmur heard. Pulmonary:     Effort: Pulmonary effort is normal. No respiratory distress.     Breath sounds: Normal breath sounds. No rales.  Abdominal:     General: Abdomen is flat. Bowel sounds are normal.     Palpations: Abdomen is soft.  Musculoskeletal:        General: No swelling.     Cervical back: Neck supple.  Skin:    General: Skin is warm.  Neurological:     General: No focal deficit present.     Mental Status: He is alert.  Psychiatric:        Mood and Affect: Mood normal.        Thought Content: Thought content normal.     Labs reviewed: Basic Metabolic Panel: Recent Labs    01/26/23 0000 03/28/23 0815 03/29/23 0942 03/29/23 1340 03/30/23 0633 03/31/23 0219 04/05/23 0000 04/16/23 1002 05/09/23 0000 05/16/23 0000 06/15/23 2048 06/15/23 2050 07/19/23 0000  NA 138   < > 137   < > 137 135   < > 138   < > 131* 138 141  142  K 4.2   < > 3.6   < > 4.0 3.5   < > 3.9   < > 3.9 3.4* 3.6 3.8  CL 105   < > 104   < > 104 102   < > 105   < > 96* 106 105 102  CO2 19   < > 25  --  25 23   < > 27   < > 22 24  --  22  GLUCOSE  --    < > 112*   < > 148* 112*  --  104*  --   --  106* 105*  --   BUN 13   < > 19   < > 16 20   < > 13   < > 12 12 13 12   CREATININE 0.9   < > 0.91   < > 0.88 0.77   < > 0.87   < > 0.7 0.85 0.80  0.8  CALCIUM  9.0   < > 8.7*  --  8.2* 8.3*   < > 8.7*   < > 9.1 8.6*  --  8.6*  MG  --   --  2.1  --  2.0 2.1  --   --   --   --   --   --   --   TSH 0.79  --   --   --   --   --   --   --   --   --   --   --   --    < > = values in this interval not displayed.   Liver Function Tests: Recent Labs    12/26/22 1414 01/26/23 0000 03/28/23 0815 05/09/23 0000 06/15/23 2048 07/19/23 0000  AST 30   < > 17  --  17 18  ALT 12   < > 16  --  16 11  ALKPHOS 85   < > 56  --  67 81  BILITOT 1.0  --  0.6  --  0.3  --   PROT 7.8  --  6.8  --  6.4*  --   ALBUMIN  3.8   < > 3.6 3.7 3.1* 3.6   < > = values in this interval not displayed.   No results for input(s): LIPASE, AMYLASE in the last 8760 hours. No results for input(s): AMMONIA in the last 8760 hours. CBC: Recent Labs    12/26/22 1414 03/28/23 0815 03/31/23 0219 04/05/23 0000 04/16/23 1002 05/09/23 0000 06/15/23 2048 06/15/23 2050 07/19/23 0000  WBC 8.1   < > 7.5   < > 8.0 6.5 6.6  --  7.9  NEUTROABS 6.2  --   --   --  5.6  --   --   --   --   HGB 12.7*   < > 9.4*   < > 10.9* 12.2* 11.8* 12.2* 11.9*  HCT 38.3*   < > 27.9*   < > 34.3* 37* 36.3* 36.0* 35*  MCV 97.0   < > 93.6  --  98.0  --  95.3  --   --   PLT 272   < > 221   < > 466* 350 316  --  326   < > = values in this interval not displayed.   Lipid Panel: Recent Labs    01/26/23 0000  CHOL 126  HDL 36  LDLCALC 39  TRIG 254*   Lab Results  Component Value  Date   HGBA1C 6.4 08/30/2017    Procedures since last visit: No results found.  Assessment/Plan 1. Severe Alzheimer's dementia of other onset with mood disturbance (HCC) (Primary) In SNF  Behaviors are controlled on number of Meds Per Dr Tasia On Rexulti  and Atarax  Ativan    2. Paroxysmal atrial fibrillation (HCC) On Metoprolol  and Eliquis   3. HLD On Statin,Repatha  and Zetia  LDL 39  4. Rheumatoid arthritis involving elbow, unspecified laterality, unspecified whether rheumatoid factor present  [facility]) Sees Dr Mai Was taking off his meds Now just monitor for symptoms   5 Recurrent major depressive disorder, i Lexapro  and Ativan   6. Gastroesophageal reflux disease  Prilosec 7 CAD  Aspirin  and Statin   Labs/tests ordered:  * No order type specified * Next appt:  Visit date not found

## 2023-11-22 DIAGNOSIS — R296 Repeated falls: Secondary | ICD-10-CM | POA: Diagnosis not present

## 2023-11-22 DIAGNOSIS — F02B3 Dementia in other diseases classified elsewhere, moderate, with mood disturbance: Secondary | ICD-10-CM | POA: Diagnosis not present

## 2023-11-22 DIAGNOSIS — M6281 Muscle weakness (generalized): Secondary | ICD-10-CM | POA: Diagnosis not present

## 2023-11-22 DIAGNOSIS — R4189 Other symptoms and signs involving cognitive functions and awareness: Secondary | ICD-10-CM | POA: Diagnosis not present

## 2023-11-22 DIAGNOSIS — R2689 Other abnormalities of gait and mobility: Secondary | ICD-10-CM | POA: Diagnosis not present

## 2023-11-28 DIAGNOSIS — R296 Repeated falls: Secondary | ICD-10-CM | POA: Diagnosis not present

## 2023-11-28 DIAGNOSIS — R4189 Other symptoms and signs involving cognitive functions and awareness: Secondary | ICD-10-CM | POA: Diagnosis not present

## 2023-11-28 DIAGNOSIS — M6281 Muscle weakness (generalized): Secondary | ICD-10-CM | POA: Diagnosis not present

## 2023-11-28 DIAGNOSIS — F02B3 Dementia in other diseases classified elsewhere, moderate, with mood disturbance: Secondary | ICD-10-CM | POA: Diagnosis not present

## 2023-11-28 DIAGNOSIS — R2689 Other abnormalities of gait and mobility: Secondary | ICD-10-CM | POA: Diagnosis not present

## 2023-12-02 DIAGNOSIS — R4189 Other symptoms and signs involving cognitive functions and awareness: Secondary | ICD-10-CM | POA: Diagnosis not present

## 2023-12-02 DIAGNOSIS — R2689 Other abnormalities of gait and mobility: Secondary | ICD-10-CM | POA: Diagnosis not present

## 2023-12-02 DIAGNOSIS — F02B3 Dementia in other diseases classified elsewhere, moderate, with mood disturbance: Secondary | ICD-10-CM | POA: Diagnosis not present

## 2023-12-02 DIAGNOSIS — M6281 Muscle weakness (generalized): Secondary | ICD-10-CM | POA: Diagnosis not present

## 2023-12-02 DIAGNOSIS — R296 Repeated falls: Secondary | ICD-10-CM | POA: Diagnosis not present

## 2023-12-05 DIAGNOSIS — R296 Repeated falls: Secondary | ICD-10-CM | POA: Diagnosis not present

## 2023-12-05 DIAGNOSIS — R4189 Other symptoms and signs involving cognitive functions and awareness: Secondary | ICD-10-CM | POA: Diagnosis not present

## 2023-12-05 DIAGNOSIS — R2689 Other abnormalities of gait and mobility: Secondary | ICD-10-CM | POA: Diagnosis not present

## 2023-12-05 DIAGNOSIS — F02B3 Dementia in other diseases classified elsewhere, moderate, with mood disturbance: Secondary | ICD-10-CM | POA: Diagnosis not present

## 2023-12-05 DIAGNOSIS — M6281 Muscle weakness (generalized): Secondary | ICD-10-CM | POA: Diagnosis not present

## 2023-12-09 DIAGNOSIS — M6281 Muscle weakness (generalized): Secondary | ICD-10-CM | POA: Diagnosis not present

## 2023-12-09 DIAGNOSIS — R2689 Other abnormalities of gait and mobility: Secondary | ICD-10-CM | POA: Diagnosis not present

## 2023-12-09 DIAGNOSIS — F02B3 Dementia in other diseases classified elsewhere, moderate, with mood disturbance: Secondary | ICD-10-CM | POA: Diagnosis not present

## 2023-12-09 DIAGNOSIS — R4189 Other symptoms and signs involving cognitive functions and awareness: Secondary | ICD-10-CM | POA: Diagnosis not present

## 2023-12-09 DIAGNOSIS — R296 Repeated falls: Secondary | ICD-10-CM | POA: Diagnosis not present

## 2023-12-09 DIAGNOSIS — F4323 Adjustment disorder with mixed anxiety and depressed mood: Secondary | ICD-10-CM | POA: Diagnosis not present

## 2023-12-13 DIAGNOSIS — F02B3 Dementia in other diseases classified elsewhere, moderate, with mood disturbance: Secondary | ICD-10-CM | POA: Diagnosis not present

## 2023-12-13 DIAGNOSIS — R2689 Other abnormalities of gait and mobility: Secondary | ICD-10-CM | POA: Diagnosis not present

## 2023-12-13 DIAGNOSIS — M6281 Muscle weakness (generalized): Secondary | ICD-10-CM | POA: Diagnosis not present

## 2023-12-13 DIAGNOSIS — R296 Repeated falls: Secondary | ICD-10-CM | POA: Diagnosis not present

## 2023-12-13 DIAGNOSIS — R4189 Other symptoms and signs involving cognitive functions and awareness: Secondary | ICD-10-CM | POA: Diagnosis not present

## 2023-12-31 ENCOUNTER — Encounter (HOSPITAL_COMMUNITY): Payer: Self-pay

## 2023-12-31 ENCOUNTER — Emergency Department (HOSPITAL_COMMUNITY): Payer: Medicare Other

## 2023-12-31 ENCOUNTER — Observation Stay (HOSPITAL_COMMUNITY)
Admission: EM | Admit: 2023-12-31 | Discharge: 2024-01-02 | Disposition: A | Discharge status: Skilled Nursing Facility | Payer: Medicare Other | Attending: Internal Medicine | Admitting: Internal Medicine

## 2023-12-31 ENCOUNTER — Other Ambulatory Visit: Payer: Self-pay

## 2023-12-31 DIAGNOSIS — I959 Hypotension, unspecified: Secondary | ICD-10-CM | POA: Diagnosis not present

## 2023-12-31 DIAGNOSIS — I480 Paroxysmal atrial fibrillation: Secondary | ICD-10-CM | POA: Insufficient documentation

## 2023-12-31 DIAGNOSIS — Z7901 Long term (current) use of anticoagulants: Secondary | ICD-10-CM | POA: Insufficient documentation

## 2023-12-31 DIAGNOSIS — E861 Hypovolemia: Secondary | ICD-10-CM | POA: Insufficient documentation

## 2023-12-31 DIAGNOSIS — I700 Atherosclerosis of aorta: Secondary | ICD-10-CM | POA: Diagnosis not present

## 2023-12-31 DIAGNOSIS — Z87891 Personal history of nicotine dependence: Secondary | ICD-10-CM | POA: Insufficient documentation

## 2023-12-31 DIAGNOSIS — G308 Other Alzheimer's disease: Secondary | ICD-10-CM

## 2023-12-31 DIAGNOSIS — Z79899 Other long term (current) drug therapy: Secondary | ICD-10-CM | POA: Diagnosis not present

## 2023-12-31 DIAGNOSIS — Z955 Presence of coronary angioplasty implant and graft: Secondary | ICD-10-CM | POA: Insufficient documentation

## 2023-12-31 DIAGNOSIS — Z1152 Encounter for screening for COVID-19: Secondary | ICD-10-CM | POA: Insufficient documentation

## 2023-12-31 DIAGNOSIS — F0390 Unspecified dementia without behavioral disturbance: Secondary | ICD-10-CM | POA: Insufficient documentation

## 2023-12-31 DIAGNOSIS — Z85828 Personal history of other malignant neoplasm of skin: Secondary | ICD-10-CM | POA: Diagnosis not present

## 2023-12-31 DIAGNOSIS — J101 Influenza due to other identified influenza virus with other respiratory manifestations: Secondary | ICD-10-CM | POA: Diagnosis not present

## 2023-12-31 DIAGNOSIS — J9601 Acute respiratory failure with hypoxia: Secondary | ICD-10-CM | POA: Insufficient documentation

## 2023-12-31 DIAGNOSIS — J1089 Influenza due to other identified influenza virus with other manifestations: Secondary | ICD-10-CM | POA: Diagnosis not present

## 2023-12-31 DIAGNOSIS — G9340 Encephalopathy, unspecified: Secondary | ICD-10-CM | POA: Insufficient documentation

## 2023-12-31 DIAGNOSIS — I10 Essential (primary) hypertension: Secondary | ICD-10-CM | POA: Diagnosis not present

## 2023-12-31 DIAGNOSIS — R509 Fever, unspecified: Secondary | ICD-10-CM | POA: Diagnosis not present

## 2023-12-31 DIAGNOSIS — I2510 Atherosclerotic heart disease of native coronary artery without angina pectoris: Secondary | ICD-10-CM | POA: Insufficient documentation

## 2023-12-31 NOTE — ED Provider Notes (Signed)
[place] EMERGENCY DEPARTMENT AT [facility] Provider Note   CSN: [id number] Arrival date & time: 12/31/23  2322     History {Add pertinent medical, surgical, social history, OB history to HPI:1} Chief Complaint  Patient presents with   Fever   Fatigue    [name] is a 89 y.o. male Alzheimer's who arrives via EMS from memory care.  He presents emergency department with notable fever, hypoxia and was hypotensive to a blood pressure of 90 systolic with EMS prior to arrival.  He received 1 L on his way here.  Patient is unable to give history.  He has apparently had a productive cough for the past few days.  Staff noticed that he was more lethargic and he vomited while he was at lunchtime today.  Unsure if this was posttussive.  He has DNR paperwork with him.  Currently on 2 L via nasal cannula.   Fever      Home Medications Prior to Admission medications   Medication Sig Start Date End Date Taking? Authorizing Provider  acetaminophen (TYLENOL) 325 MG tablet Take 2 tablets (650 mg total) by mouth in the morning and at bedtime. 04/04/23   Fletcher Anon, NP  acetaminophen (TYLENOL) 325 MG tablet Take 650 mg by mouth 3 (three) times daily as needed.    [provider]  apixaban (ELIQUIS) 5 MG TABS tablet Take 1 tablet (5 mg total) by mouth 2 (two) times daily. 11/30/22   [name], MD  brexpiprazole (REXULTI) 1 MG TABS tablet Take 3 mg by mouth at bedtime. 9 pm    [provider]  camphor-menthol (SARNA) lotion Apply 1 Application topically 2 (two) times daily as needed for itching.    [provider]  docusate sodium (COLACE) 100 MG capsule Take 1 capsule (100 mg total) by mouth 2 (two) times daily. 03/31/23   Rolly Salter, MD  Emollient (CETAPHIL) cream Apply topically daily at 6 (six) AM. 09/24/23   Fletcher Anon, NP  escitalopram (LEXAPRO) 20 MG tablet Take 20 mg by mouth daily.    [provider]  Evolocumab (REPATHA  SURECLICK) 140 MG/ML SOAJ INJECT 1 PEN INTO SKIN EVERY 14 DAYS 03/30/21   Quintella Reichert, MD  ezetimibe (ZETIA) 10 MG tablet Take 5 mg by mouth daily.    [provider]  hydrOXYzine (ATARAX) 50 MG tablet Take 50 mg by mouth 2 (two) times daily. At [facility] and 5 pm    [provider]  LORazepam (ATIVAN) 0.5 MG tablet Take 1 tablet (0.5 mg total) by mouth 2 (two) times daily. 11/07/23   [name], MD  melatonin 5 MG TABS Take 5 mg by mouth at bedtime.    [provider]  methocarbamol (ROBAXIN) 500 MG tablet Take 1 tablet (500 mg total) by mouth every 6 (six) hours as needed for muscle spasms. 03/31/23   Rolly Salter, MD  metoprolol tartrate (LOPRESSOR) 25 MG tablet Take 25 mg by mouth 2 (two) times daily. Hold for SBP <100    [provider]  Multiple Vitamin (MULTIVITAMIN WITH MINERALS) TABS tablet Take 1 tablet by mouth daily. 04/01/23   Rolly Salter, MD  nitroGLYCERIN (NITROSTAT) 0.4 MG SL tablet Place 0.4 mg under the tongue every 5 (five) minutes as needed for chest pain.    [provider]  omeprazole (PRILOSEC) 40 MG capsule Take 40 mg by mouth in the morning. Take on empty stomach 30 minutes before breakfast.  [provider]  rosuvastatin (CRESTOR) 5 MG tablet Take 5 mg by mouth every morning.    [provider]  triamcinolone cream (KENALOG) 0.1 % Apply 1 Application topically 2 (two) times daily as needed.    [provider]  apixaban (ELIQUIS) 2.5 MG TABS tablet Take 1 tablet (2.5 mg total) by mouth 2 (two) times daily. 10/18/22   Fletcher Anon, NP      Allergies    Methotrexate, Aricept Palma Holter hcl], Crestor [rosuvastatin calcium], Lisinopril, Namenda [memantine], Atorvastatin, Depakote [divalproex sodium], and Pravastatin sodium    Review of Systems   Review of Systems  Constitutional:  Positive for fever.    Physical Exam Updated Vital Signs BP 114/60 (BP Location: Right Arm)   Pulse 67    Temp 98.1 F (36.7 C) (Oral)   Resp (!) 21   SpO2 93%  Physical Exam Vitals and nursing note reviewed.  Constitutional:      General: He is not in acute distress.    Appearance: He is well-developed. He is not diaphoretic.  HENT:     Head: Normocephalic and atraumatic.  Eyes:     General: No scleral icterus.    Conjunctiva/sclera: Conjunctivae normal.  Cardiovascular:     Rate and Rhythm: Normal rate and regular rhythm.     Heart sounds: Normal heart sounds.  Pulmonary:     Effort: Pulmonary effort is normal. No respiratory distress.     Breath sounds: Wheezing present.  Abdominal:     General: There is no distension.     Palpations: Abdomen is soft.     Tenderness: There is no abdominal tenderness. There is no guarding.  Musculoskeletal:     Cervical back: Normal range of motion and neck supple.  Skin:    General: Skin is warm and dry.  Neurological:     Mental Status: He is alert.     ED Results / Procedures / Treatments   Labs (all labs ordered are listed, but only abnormal results are displayed) Labs Reviewed  CULTURE, BLOOD (ROUTINE X 2)  CULTURE, BLOOD (ROUTINE X 2)  RESP PANEL BY RT-PCR (RSV, FLU A&B, COVID)  RVPGX2  COMPREHENSIVE METABOLIC PANEL  CBC WITH DIFFERENTIAL/PLATELET  PROTIME-INR  APTT  URINALYSIS, W/ REFLEX TO CULTURE (INFECTION SUSPECTED)  BLOOD GAS, VENOUS  I-STAT CG4 LACTIC ACID, ED    EKG None  Radiology No results found.  Procedures Procedures  {Document cardiac monitor, telemetry assessment procedure when appropriate:1}  Medications Ordered in ED Medications - No data to display  ED Course/ Medical Decision Making/ A&P   {   Click here for ABCD2, HEART and other calculatorsREFRESH Note before signing :1}                              Medical Decision Making Amount and/or Complexity of Data Reviewed Labs: ordered. Radiology: ordered.   This patient presents to the ED with chief complaint(s) of *** with pertinent past  medical history of *** which further complicates the presenting complaint. The complaint involves an extensive differential diagnosis and treatment options and also carries with it a high risk of complications and morbidity.    The differential diagnosis includes ***   The initial plan is to order *** and to treat patient with *** for ***  Additional Tests and treatment considered: Patient is low risk / negative by ***, therefore do not feel that *** is indicated.  Additional history obtained:  Additional history obtained from {additional history:26846} Records reviewed {records:26847}  Reassessment and review (also see workup area): Lab Tests: I Ordered, and personally interpreted labs.  The pertinent results include:  ***  Imaging Studies: I ordered and independently visualized and interpreted the following imaging {imaging:26848}  which showed *** The interpretation of the imaging was limited to assessing for emergent pathology, for which purpose it was ordered.  Consultations Obtained: I requested consultation with the {consultation:26851}, and discussed  findings as well as pertinent plan - they recommend: ***  Medicines ordered and prescription drug management: I ordered the following medications *** for ***  I considered this additional medications: ***   Reevaluation of the patient after these medicines showed that the patient    {resolved/improved/worsened:23923::"improved"}  Social Determinants of Health: Patient's {YQIH:47425}  increases the complexity of managing their presentation  Cardiac Monitoring: The patient was maintained on a cardiac monitor.  I personally viewed and interpreted the cardiac monitor which showed an underlying rhythm of:  {cardiac monitor:26849}  Complexity of problems addressed: Patient's presentation is most consistent with  {COPA:26843} During patient's assessment  Disposition: After consideration of the diagnostic results and the  patient's response to treatment,  I feel that the patent would benefit from {disposition:26850}.    {Document critical care time when appropriate:1} {Document review of labs and clinical decision tools ie heart score, Chads2Vasc2 etc:1}  {Document your independent review of radiology images, and any outside records:1} {Document your discussion with family members, caretakers, and with consultants:1} {Document social determinants of health affecting pt's care:1} {Document your decision making why or why not admission, treatments were needed:1} Final Clinical Impression(s) / ED Diagnoses Final diagnoses:  None    Rx / DC Orders ED Discharge Orders     None

## 2023-12-31 NOTE — ED Triage Notes (Addendum)
Pt came in via EMS from [employer]. Pt has c/o of a fever, productive cough, vomiting, and lethargy x 1 day. Pt given 650 mg of tylenol with no relief. Pt has hx of alzheimer and is currently at baseline.    EMS 2L 98% 110/62 after 1L of LR 102.5 temp

## 2024-01-01 ENCOUNTER — Encounter (HOSPITAL_COMMUNITY): Payer: Self-pay | Admitting: Internal Medicine

## 2024-01-01 DIAGNOSIS — I700 Atherosclerosis of aorta: Secondary | ICD-10-CM | POA: Diagnosis not present

## 2024-01-01 DIAGNOSIS — J1089 Influenza due to other identified influenza virus with other manifestations: Secondary | ICD-10-CM | POA: Diagnosis not present

## 2024-01-01 DIAGNOSIS — E861 Hypovolemia: Secondary | ICD-10-CM | POA: Diagnosis not present

## 2024-01-01 DIAGNOSIS — G9340 Encephalopathy, unspecified: Secondary | ICD-10-CM | POA: Insufficient documentation

## 2024-01-01 DIAGNOSIS — J101 Influenza due to other identified influenza virus with other respiratory manifestations: Secondary | ICD-10-CM | POA: Diagnosis not present

## 2024-01-01 DIAGNOSIS — J9601 Acute respiratory failure with hypoxia: Secondary | ICD-10-CM | POA: Insufficient documentation

## 2024-01-01 LAB — RESP PANEL BY RT-PCR (RSV, FLU A&B, COVID)  RVPGX2
Influenza A by PCR: POSITIVE — AB
Influenza B by PCR: NEGATIVE
Resp Syncytial Virus by PCR: NEGATIVE
SARS Coronavirus 2 by RT PCR: NEGATIVE

## 2024-01-01 LAB — BLOOD GAS, VENOUS
Acid-Base Excess: 0.3 mmol/L (ref 0.0–2.0)
Bicarbonate: 25.4 mmol/L (ref 20.0–28.0)
O2 Saturation: 83.2 %
Patient temperature: 37
pCO2, Ven: 42 mm[Hg] — ABNORMAL LOW (ref 44–60)
pH, Ven: 7.39 (ref 7.25–7.43)
pO2, Ven: 48 mm[Hg] — ABNORMAL HIGH (ref 32–45)

## 2024-01-01 LAB — URINALYSIS, W/ REFLEX TO CULTURE (INFECTION SUSPECTED)
Bacteria, UA: NONE SEEN
Bilirubin Urine: NEGATIVE
Glucose, UA: NEGATIVE mg/dL
Hgb urine dipstick: NEGATIVE
Ketones, ur: NEGATIVE mg/dL
Leukocytes,Ua: NEGATIVE
Nitrite: NEGATIVE
Protein, ur: 30 mg/dL — AB
Specific Gravity, Urine: 1.025 (ref 1.005–1.030)
pH: 6 (ref 5.0–8.0)

## 2024-01-01 LAB — CBC WITH DIFFERENTIAL/PLATELET
Abs Immature Granulocytes: 0.07 10*3/uL (ref 0.00–0.07)
Basophils Absolute: 0 10*3/uL (ref 0.0–0.1)
Basophils Relative: 1 %
Eosinophils Absolute: 0 10*3/uL (ref 0.0–0.5)
Eosinophils Relative: 1 %
HCT: 32.8 % — ABNORMAL LOW (ref 39.0–52.0)
Hemoglobin: 11 g/dL — ABNORMAL LOW (ref 13.0–17.0)
Immature Granulocytes: 1 %
Lymphocytes Relative: 7 %
Lymphs Abs: 0.4 10*3/uL — ABNORMAL LOW (ref 0.7–4.0)
MCH: 30.8 pg (ref 26.0–34.0)
MCHC: 33.5 g/dL (ref 30.0–36.0)
MCV: 91.9 fL (ref 80.0–100.0)
Monocytes Absolute: 0.5 10*3/uL (ref 0.1–1.0)
Monocytes Relative: 8 %
Neutro Abs: 5 10*3/uL (ref 1.7–7.7)
Neutrophils Relative %: 82 %
Platelets: 227 10*3/uL (ref 150–400)
RBC: 3.57 MIL/uL — ABNORMAL LOW (ref 4.22–5.81)
RDW: 14.2 % (ref 11.5–15.5)
WBC: 6.1 10*3/uL (ref 4.0–10.5)
nRBC: 0 % (ref 0.0–0.2)

## 2024-01-01 LAB — COMPREHENSIVE METABOLIC PANEL
ALT: 29 U/L (ref 0–44)
AST: 24 U/L (ref 15–41)
Albumin: 3.2 g/dL — ABNORMAL LOW (ref 3.5–5.0)
Alkaline Phosphatase: 55 U/L (ref 38–126)
Anion gap: 8 (ref 5–15)
BUN: 20 mg/dL (ref 8–23)
CO2: 23 mmol/L (ref 22–32)
Calcium: 8.5 mg/dL — ABNORMAL LOW (ref 8.9–10.3)
Chloride: 107 mmol/L (ref 98–111)
Creatinine, Ser: 0.89 mg/dL (ref 0.61–1.24)
GFR, Estimated: >60 mL/min (ref >60)
Glucose, Bld: 104 mg/dL — ABNORMAL HIGH (ref 70–99)
Potassium: 3.9 mmol/L (ref 3.5–5.1)
Sodium: 138 mmol/L (ref 135–145)
Total Bilirubin: 0.5 mg/dL (ref 0.0–1.2)
Total Protein: 6.6 g/dL (ref 6.5–8.1)

## 2024-01-01 LAB — PROTIME-INR
INR: 1.4 — ABNORMAL HIGH (ref 0.8–1.2)
Prothrombin Time: 17.6 s — ABNORMAL HIGH (ref 11.4–15.2)

## 2024-01-01 LAB — APTT: aPTT: 33 s (ref 24–36)

## 2024-01-01 LAB — LACTIC ACID, PLASMA
Lactic Acid, Venous: 0.8 mmol/L (ref 0.5–1.9)
Lactic Acid, Venous: 0.9 mmol/L (ref 0.5–1.9)

## 2024-01-01 MED ORDER — HALOPERIDOL LACTATE 5 MG/ML IJ SOLN: 1.0000 mg | Freq: Four times a day (QID) | INTRAMUSCULAR | Status: DC | PRN

## 2024-01-01 MED ORDER — SODIUM CHLORIDE 0.9% FLUSH
3.0000 mL | Freq: Two times a day (BID) | INTRAVENOUS | Status: DC
  Administered 2024-01-01 – 2024-01-02 (×4): 3 mL via INTRAVENOUS

## 2024-01-01 MED ORDER — ROSUVASTATIN CALCIUM 5 MG PO TABS
5.0000 mg | ORAL_TABLET | Freq: Every morning | ORAL | Status: DC
  Administered 2024-01-01 – 2024-01-02 (×2): 5 mg via ORAL
  Filled 2024-01-01 (×3): qty 1

## 2024-01-01 MED ORDER — SODIUM CHLORIDE 0.9 % IV SOLN
1.0000 g | Freq: Once | INTRAVENOUS | Status: AC
  Administered 2024-01-01: 1 g via INTRAVENOUS
  Filled 2024-01-01: qty 10

## 2024-01-01 MED ORDER — EZETIMIBE 10 MG PO TABS
5.0000 mg | ORAL_TABLET | Freq: Every day | ORAL | Status: DC
  Administered 2024-01-01 – 2024-01-02 (×2): 5 mg via ORAL
  Filled 2024-01-01: qty 1
  Filled 2024-01-01: qty 0.5
  Filled 2024-01-01: qty 1

## 2024-01-01 MED ORDER — SODIUM CHLORIDE 0.9 % IV BOLUS
1000.0000 mL | Freq: Once | INTRAVENOUS | Status: AC
Start: 2024-01-01 — End: 2024-01-01
  Administered 2024-01-01: 1000 mL via INTRAVENOUS

## 2024-01-01 MED ORDER — OSELTAMIVIR PHOSPHATE 75 MG PO CAPS
75.0000 mg | ORAL_CAPSULE | Freq: Every day | ORAL | Status: DC
  Administered 2024-01-01 (×2): 75 mg via ORAL
  Filled 2024-01-01 (×2): qty 1

## 2024-01-01 MED ORDER — PANTOPRAZOLE SODIUM 40 MG PO TBEC
40.0000 mg | DELAYED_RELEASE_TABLET | Freq: Every day | ORAL | Status: DC
  Administered 2024-01-01 – 2024-01-02 (×2): 40 mg via ORAL
  Filled 2024-01-01 (×2): qty 1

## 2024-01-01 MED ORDER — METOPROLOL TARTRATE 25 MG PO TABS
25.0000 mg | ORAL_TABLET | Freq: Two times a day (BID) | ORAL | Status: DC
  Administered 2024-01-01 (×2): 25 mg via ORAL
  Filled 2024-01-01 (×3): qty 1

## 2024-01-01 MED ORDER — ACETAMINOPHEN 500 MG PO TABS
1000.0000 mg | ORAL_TABLET | Freq: Four times a day (QID) | ORAL | Status: DC | PRN
  Administered 2024-01-01: 1000 mg via ORAL
  Filled 2024-01-01: qty 2

## 2024-01-01 MED ORDER — APIXABAN 5 MG PO TABS
5.0000 mg | ORAL_TABLET | Freq: Two times a day (BID) | ORAL | Status: DC
Start: 2024-01-01 — End: 2024-01-02
  Administered 2024-01-01 – 2024-01-02 (×3): 5 mg via ORAL
  Filled 2024-01-01 (×3): qty 1

## 2024-01-01 MED ORDER — HYDROXYZINE HCL 25 MG PO TABS
50.0000 mg | ORAL_TABLET | Freq: Two times a day (BID) | ORAL | Status: DC
  Administered 2024-01-01 – 2024-01-02 (×3): 50 mg via ORAL
  Filled 2024-01-01 (×3): qty 2

## 2024-01-01 MED ORDER — MELATONIN 3 MG PO TABS
6.0000 mg | ORAL_TABLET | Freq: Every day | ORAL | Status: DC
  Administered 2024-01-01: 6 mg via ORAL
  Filled 2024-01-01: qty 2

## 2024-01-01 MED ORDER — LORAZEPAM 0.5 MG PO TABS
0.5000 mg | ORAL_TABLET | Freq: Two times a day (BID) | ORAL | Status: DC
  Administered 2024-01-01 – 2024-01-02 (×3): 0.5 mg via ORAL
  Filled 2024-01-01 (×3): qty 1

## 2024-01-01 MED ORDER — ONDANSETRON HCL 4 MG/2ML IJ SOLN: 4.0000 mg | Freq: Four times a day (QID) | INTRAMUSCULAR | Status: DC | PRN

## 2024-01-01 MED ORDER — ESCITALOPRAM OXALATE 20 MG PO TABS
20.0000 mg | ORAL_TABLET | Freq: Every day | ORAL | Status: DC
  Administered 2024-01-01 – 2024-01-02 (×2): 20 mg via ORAL
  Filled 2024-01-01 (×2): qty 1

## 2024-01-01 MED ORDER — SODIUM CHLORIDE 0.9 % IV SOLN
500.0000 mg | Freq: Once | INTRAVENOUS | Status: AC
  Administered 2024-01-01: 500 mg via INTRAVENOUS
  Filled 2024-01-01: qty 5

## 2024-01-01 MED ORDER — BREXPIPRAZOLE 1 MG PO TABS
3.0000 mg | ORAL_TABLET | Freq: Every day | ORAL | Status: DC
  Administered 2024-01-01: 3 mg via ORAL
  Filled 2024-01-01: qty 3

## 2024-01-01 MED ORDER — ALBUTEROL SULFATE (2.5 MG/3ML) 0.083% IN NEBU: 2.5000 mg | INHALATION_SOLUTION | RESPIRATORY_TRACT | Status: DC | PRN

## 2024-01-01 MED ORDER — POLYETHYLENE GLYCOL 3350 17 G PO PACK: 17.0000 g | PACK | Freq: Every day | ORAL | Status: DC | PRN

## 2024-01-01 NOTE — Plan of Care (Signed)

## 2024-01-01 NOTE — Plan of Care (Signed)
  Problem: Education: Goal: Knowledge of General Education information will improve Description: Including pain rating scale, medication(s)/side effects and non-pharmacologic comfort measures Outcome: Progressing   Problem: Clinical Measurements: Goal: Ability to maintain clinical measurements within normal limits will improve Outcome: Progressing Goal: Will remain free from infection Outcome: Progressing   Problem: Nutrition: Goal: Adequate nutrition will be maintained Outcome: Progressing   Problem: Pain Managment: Goal: General experience of comfort will improve and/or be controlled Outcome: Progressing   Problem: Safety: Goal: Ability to remain free from injury will improve Outcome: Progressing   Problem: Skin Integrity: Goal: Risk for impaired skin integrity will decrease Outcome: Progressing

## 2024-01-01 NOTE — H&P (Addendum)
History and Physical    [name] [facility] ZOX:[id number] DOB: 04/27/1933 DOA: 12/31/2023  PCP: [name], MD   [facility] from: [facility]    Chief Complaint:  Chief Complaint  Patient presents with   Fever   Fatigue    HPI: History limited due to patients dementia  [name] is a 89 y.o. male with hx of dementia with behavioral disturbance, CAD with hx prior STEMI and multiple PCI, Afib on anticoagulation, prior hx SDH, HTN, HLD, RA, Mood disorder, hx falls, OSA on CPAP, who was brought in from memory care due to AMS, fever, cough, hypoxia   Patient unable to provide significant history although awake and interactive. Per ED, " He presents emergency department with notable fever, hypoxia and was hypotensive to a blood pressure of 90 systolic with EMS prior to arrival. He received 1 L on his way here. Patient is unable to give history. He has apparently had a productive cough for the past few days. Staff noticed that he was more lethargic and he vomited while he was at lunchtime today. Unsure if this was posttussive. He has DNR paperwork with him. Currently on 2 L via nasal cannula. "    Review of Systems:  Unable to complete due to dementia   Allergies  Allergen Reactions   Methotrexate Other (See Comments)    REACTION: tachycardia   Aricept [Donepezil Hcl] Other (See Comments)    Nightmares. Pt takes this medication per [facility]    Crestor [Rosuvastatin Calcium] Other (See Comments)    Myalgias with 20mg  dose   Lisinopril Other (See Comments)    cough   Namenda [Memantine] Diarrhea and Nausea Only   Atorvastatin Other (See Comments)    Muscle pain, leg cramps   Depakote [Divalproex Sodium] Rash   Pravastatin Sodium Other (See Comments)    REACTION: cramps, fatigue    Prior to Admission medications   Medication Sig Start Date End Date Taking? Authorizing Provider  acetaminophen (TYLENOL) 325 MG tablet Take 2 tablets (650 mg total) by mouth in the  morning and at bedtime. 04/04/23   Fletcher Anon, NP  acetaminophen (TYLENOL) 325 MG tablet Take 650 mg by mouth 3 (three) times daily as needed.    [provider]  apixaban (ELIQUIS) 5 MG TABS tablet Take 1 tablet (5 mg total) by mouth 2 (two) times daily. 11/30/22   [name], MD  brexpiprazole (REXULTI) 1 MG TABS tablet Take 3 mg by mouth at bedtime. 9 pm    [provider]  camphor-menthol (SARNA) lotion Apply 1 Application topically 2 (two) times daily as needed for itching.    [provider]  docusate sodium (COLACE) 100 MG capsule Take 1 capsule (100 mg total) by mouth 2 (two) times daily. 03/31/23   Rolly Salter, MD  Emollient (CETAPHIL) cream Apply topically daily at 6 (six) AM. 09/24/23   Fletcher Anon, NP  escitalopram (LEXAPRO) 20 MG tablet Take 20 mg by mouth daily.    [provider]  Evolocumab (REPATHA SURECLICK) 140 MG/ML SOAJ INJECT 1 PEN INTO SKIN EVERY 14 DAYS 03/30/21   Quintella Reichert, MD  ezetimibe (ZETIA) 10 MG tablet Take 5 mg by mouth daily.    [provider]  hydrOXYzine (ATARAX) 50 MG tablet Take 50 mg by mouth 2 (two) times daily. At [facility] Hights and 5 pm    [provider]  LORazepam (ATIVAN) 0.5 MG tablet Take 1 tablet (0.5  mg total) by mouth 2 (two) times daily. 11/07/23   [name], MD  melatonin 5 MG TABS Take 5 mg by mouth at bedtime.    [provider]  methocarbamol (ROBAXIN) 500 MG tablet Take 1 tablet (500 mg total) by mouth every 6 (six) hours as needed for muscle spasms. 03/31/23   Rolly Salter, MD  metoprolol tartrate (LOPRESSOR) 25 MG tablet Take 25 mg by mouth 2 (two) times daily. Hold for SBP <100    [provider]  Multiple Vitamin (MULTIVITAMIN WITH MINERALS) TABS tablet Take 1 tablet by mouth daily. 04/01/23   Rolly Salter, MD  nitroGLYCERIN (NITROSTAT) 0.4 MG SL tablet Place 0.4 mg under the tongue every 5 (five) minutes as needed for chest pain.    [provider]  omeprazole (PRILOSEC) 40 MG capsule Take 40 mg by mouth in the morning. Take on empty stomach 30 minutes before breakfast.    [provider]  rosuvastatin (CRESTOR) 5 MG tablet Take 5 mg by mouth every morning.    [provider]  triamcinolone cream (KENALOG) 0.1 % Apply 1 Application topically 2 (two) times daily as needed.    [provider]  apixaban (ELIQUIS) 2.5 MG TABS tablet Take 1 tablet (2.5 mg total) by mouth 2 (two) times daily. 10/18/22   Fletcher Anon, NP    Past Medical History:  Diagnosis Date   CAD (coronary artery disease)    a. s/p inferior STEMI 01/08/2014; LHC 01/08/14: total RCA occlusion s/p 3.5x54mm Xience DES distal RCA and 3.5x28 mm DES mid RCA, 60-70% mid LAD stenosis, EF 55%.   Complication of anesthesia    'long to wake up after back surgery " 09/2003   Diverticulosis of colon    GERD (gastroesophageal reflux disease)    History of cellulitis    05-26-2015  LLE   History of colon polyps    1998- benign/  2008 adenomatous    History of kidney stones    2013   History of squamous cell carcinoma excision    2013;  2015;  06-12-2015 right leg/  02/ and 05/ 2017  left ear and left leg   Hydronephrosis, left    Hyperlipidemia    Hypertension    Migraine with aura    OSA on CPAP 06/19/2015   Moderate OSA with AHI 18/hr  per study 05-20-2015   Osteoarthritis    Paroxysmal atrial fibrillation (HCC) 4/16   chads2vasc score is at least 4   Premature atrial contractions    RA (rheumatoid arthritis) [facility])    rheumatologist-  dr Alben Deeds   Sinusitis, chronic 10/17/2015   Wears glasses    Wears hearing aid    bilateral    Past Surgical History:  Procedure Laterality Date   BACK SURGERY     CARDIOVASCULAR STRESS TEST  11/21/2015   Low risk nuclear study w/ a small diaphragmatic attenuation artifact, no ischemia/  normal LV function and wall motion , ef 63%   CATARACT EXTRACTION W/ INTRAOCULAR LENS  IMPLANT,  BILATERAL  2015   COLONOSCOPY  last one 06-09-2011   CORONARY ANGIOPLASTY     CYSTOSCOPY W/ RETROGRADES Left 07/12/2016   Procedure: CYSTOSCOPY WITH RETROGRADE PYELOGRAM LEFT URETERAL STENT;  Surgeon: Bjorn Pippin, MD;  Location: [facility];  Service: Urology;  Laterality: Left;   CYSTOSCOPY WITH RETROGRADE PYELOGRAM, URETEROSCOPY AND STENT PLACEMENT Left 08/11/2016   Procedure: CYSTOSCOPY WITH RETROGRADE PYELOGRAM,  DIAGNOSTIC URETEROSCOPY , STENT EXCHANGE;  Surgeon:  Sebastian Ache, MD;  Location: [facility];  Service: Urology;  Laterality: Left;   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   severe fibromatosis palm and fingers   LEFT HEART CATHETERIZATION WITH CORONARY ANGIOGRAM N/A 01/08/2014   Procedure: LEFT HEART CATHETERIZATION WITH CORONARY ANGIOGRAM;  Surgeon: Lennette Bihari, MD;  Location: [facility] CATH LAB;  Service: Cardiovascular;  Laterality:N/A;  total/ subtotal RCA/  mLAD 60-70% w/ mid systolic bridging/  preserved global LVF, ef 55%   MOHS SURGERY  x2  feb and may 2017   left ear /  left leg  (SCC)   ORIF ACETABULAR FRACTURE Right 03/29/2023   Procedure: OPEN REDUCTION INTERNAL FIXATION (ORIF) ACETABULAR FRACTURE;  Surgeon: Myrene Galas, MD;  Location: [facility];  Service: Orthopedics;  Laterality: Right;   PERCUTANEOUS CORONARY STENT INTERVENTION (PCI-S)  01/08/2014   Procedure: PERCUTANEOUS CORONARY STENT INTERVENTION (PCI-S);  Surgeon: Lennette Bihari, MD;  [facility] C[facility] LAB;  Service: Cardiovascular;;  DES to mid and distal RCA   POSTERIOR LUMBAR FUSION  10/01/2003   and Laminectomy/ diskectomy  L4 -- S1   ROBOT ASSISTED PYELOPLASTY Left 09/15/2016   Procedure: XI ROBOTIC ASSISTED PYELOPLASTY;  Surgeon: Sebastian Ache, MD;  Location: [facility];  Service: Urology;  Laterality: Left;   TONSILLECTOMY     TRANSTHORACIC ECHOCARDIOGRAM  02/23/2016   mild LVH,  ef 50-55%,  grade 1 diastolic dysfunction/  mild to moderate AV calcification w/ no stenosis or regurg./  trivial MR  and TR/  mild PR     reports that he quit smoking about 55 years ago. His smoking use included cigarettes. He started smoking about 65 years ago. He has never used smokeless tobacco. He reports that he does not currently use alcohol after a past usage of about 2.0 standard drinks of alcohol per week. He reports that he does not use drugs.  Family History  Problem Relation Age of Onset   Hypertension Mother    Heart disease Father    Colon cancer Neg Hx      Physical Exam: Vitals:   01/01/24 0100 01/01/24 0115 01/01/24 0200 01/01/24 0300  BP: (!) 96/58 (!) 90/59 119/72 107/62  Pulse: 64 63 63 63  Resp: 20 15 17  (!) 21  Temp:      TempSrc:      SpO2: 93% 93% 97% 95%    Gen: Awake, alert, chronically ill-appearing CV: Regular, normal S1, S2, no murmurs  Resp: Normal WOB, turned off O2 and maintaining sat at 92%, clear anteriorly.  Abd: Flat, normoactive, nontender MSK: Symmetric, no edema  Skin: No rashes or lesions to exposed skin  Neuro: Alert and interactive, oriented to self only  Psych: euthymic, appropriate    Data review:   Labs reviewed, notable for:   Lactate 0.8 VBG 7.39/42 Chemistries and blood counts otherwise unremarkable  Micro:  Results for orders placed or performed during the hospital encounter of 12/31/23  Resp panel by RT-PCR (RSV, Flu A&B, Covid) Anterior Nasal Swab     Status: Abnormal   Collection Time: 12/31/23 11:39 PM   Specimen: Anterior Nasal Swab  Result Value Ref Range Status   SARS Coronavirus 2 by RT PCR NEGATIVE NEGATIVE Final    Comment: (NOTE) SARS-CoV-2 target nucleic acids are NOT DETECTED.  The SARS-CoV-2 RNA is generally detectable in upper respiratory specimens during the acute phase of infection. The lowest concentration of SARS-CoV-2 viral copies this assay can detect is 138 copies/mL. A negative result does not preclude SARS-Cov-2  infection and should not be used as the sole basis for treatment or other patient management  decisions. A negative result may occur with  improper specimen collection/handling, submission of specimen other than nasopharyngeal swab, presence of viral mutation(s) within the areas targeted by this assay, and inadequate number of viral copies(<138 copies/mL). A negative result must be combined with clinical observations, patient history, and epidemiological information. The expected result is Negative.  Fact Sheet for Patients:  BloggerCourse.com  Fact Sheet for Healthcare Providers:  SeriousBroker.it  This test is no t yet approved or cleared by the [place] FDA and  has been authorized for detection and/or diagnosis of SARS-CoV-2 by FDA under an Emergency Use Authorization (EUA). This EUA will remain  in effect (meaning this test can be used) for the duration of the COVID-19 declaration under Section 564(b)(1) of the Act, 21 U.S.C.section 360bbb-3(b)(1), unless the authorization is terminated  or revoked sooner.       Influenza A by PCR POSITIVE (A) NEGATIVE Final   Influenza B by PCR NEGATIVE NEGATIVE Final    Comment: (NOTE) The Xpert Xpress SARS-CoV-2/FLU/RSV plus assay is intended as an aid in the diagnosis of influenza from Nasopharyngeal swab specimens and should not be used as a sole basis for treatment. Nasal washings and aspirates are unacceptable for Xpert Xpress SARS-CoV-2/FLU/RSV testing.  Fact Sheet for Patients: BloggerCourse.com  Fact Sheet for Healthcare Providers: SeriousBroker.it  This test is not yet approved or cleared by the [place] FDA and has been authorized for detection and/or diagnosis of SARS-CoV-2 by FDA under an Emergency Use Authorization (EUA). This EUA will remain in effect (meaning this test can be used) for the duration of the COVID-19 declaration under Section 564(b)(1) of the Act, 21 U.S.C. section 360bbb-3(b)(1), unless  the authorization is terminated or revoked.     Resp Syncytial Virus by PCR NEGATIVE NEGATIVE Final    Comment: (NOTE) Fact Sheet for Patients: BloggerCourse.com  Fact Sheet for Healthcare Providers: SeriousBroker.it  This test is not yet approved or cleared by the [place] FDA and has been authorized for detection and/or diagnosis of SARS-CoV-2 by FDA under an Emergency Use Authorization (EUA). This EUA will remain in effect (meaning this test can be used) for the duration of the COVID-19 declaration under Section 564(b)(1) of the Act, 21 U.S.C. section 360bbb-3(b)(1), unless the authorization is terminated or revoked.  Performed at [facility], [address], [place], [place] [address]     Imaging reviewed:  [facility] Chest Port 1 View Result Date: 01/01/2024 CLINICAL DATA:  Questionable sepsis - evaluate for abnormality EXAM: PORTABLE CHEST 1 VIEW COMPARISON:  Chest x-ray 06/15/2023 FINDINGS: The heart and mediastinal contours are unchanged. Atherosclerotic plaque. No focal consolidation. No pulmonary edema. No pleural effusion. No pneumothorax. No acute osseous abnormality.  Old healed left rib fractures. IMPRESSION: 1. No active disease. 2.  Aortic Atherosclerosis (ICD10-I70.0). Electronically Signed   By: Tish Frederickson M.D.   On: 01/01/2024 00:37    EKG:  Personally reviewed sinus rhythm with no acute ischemic changes.  ED Course:  On EMS arrival systolic in the 90s, received 1 L IV fluid prior to arrival.  Remained hypotensive 90s over 50s in the ED received a second liter of fluids with improvement in blood pressure. Apparently hypoxic and arrived on 2L. Treated with ceftriaxone, azithromycin in ED    Assessment/Plan:  89 y.o. male with hx dementia with behavioral disturbance, CAD with hx prior STEMI and multiple PCI, Afib on  anticoagulation, prior hx SDH, HTN, HLD, RA, Mood disorder, hx falls, OSA on  CPAP, who was brought in from memory care due to AMS, febrile illness with GI / respiratory symptoms, with hypoxia and hypotension. Found to have influenza A with likely hypovolemia driving hypotension.     Influenza A  Acute hypoxic respiratory failure, secondary to above, resolved Hx unclear reportedly few days of cough, vomiting today + more lethargic / altered. Arrived on 2L O2 but unknown O2 sat with EMS-> off O2 maintaining sats during my evaluation. Hypotensive with systolic in 90s, resolved with fluids. Exam unremarkable. CXR with no infiltrate.  - Tamiflu 75 mg daily x 5 days - S/p ceftriaxone, azithromycin in the ED.  Stop abx as most likely isolated viral illness - Supportive care, albuterol as needed, incentive spirometer if he is able, encourage out of bed to chair during the day.  Hypotension, hypovolemic- resolved  - S/p 2 L IV fluid.  Continue oral hydration as able. - As long as BP remains stable should be able to return to facility later today   Encephalopathy, likely related to influenza A illness - Management directed and influenza A above - Delirium precautions, fall precautions, continues home psychotropic meds per below  Chronic medical problems: Dementia with behavioral disturbance: Continues home Rexulti, escitalopram, Ativan twice daily, hydroxyzine twice daily.  Add Haldol as needed for agitation if not redirectable and risk to self or others. CAD, Hx prior STEMI, multiple PCI; HLD: On DOAC, Repatha outpatient, continue rosuvastatin and Zetia. A-fib on anticoagulation: Continues home Eliquis, metoprolol twice daily History of prior SDH: Noted, with history of frequent falls will need to be monitored for appropriateness of continued anticoagulation. Hypertension: Beta-blocker per above RA: No longer on disease modifying treatments Mood disorder: Continue psychotropic meds per dementia above History of falls: Fall precautions OSA: Unclear if he is still on  CPAP GERD: sub home PPI   There is no height or weight on file to calculate BMI.    DVT prophylaxis:  Eliquis Code Status:  DNR/DNI(Do NOT Intubate); Durable DNR on file  Diet:  Diet Orders (From admission, onward)     Start     Ordered   01/01/24 0301  Diet regular Room service appropriate? Yes; Fluid consistency: Thin  Diet effective now       Question Answer Comment  Room service appropriate? Yes   Fluid consistency: Thin      01/01/24 0301           Family Communication:  No   Consults:  None   Admission status:   Observation, Telemetry bed  Severity of Illness: The appropriate patient status for this patient is OBSERVATION. Observation status is judged to be reasonable and necessary in order to provide the required intensity of service to ensure the patient's safety. The patient's presenting symptoms, physical exam findings, and initial radiographic and laboratory data in the context of their medical condition is felt to place them at decreased risk for further clinical deterioration. Furthermore, it is anticipated that the patient will be medically stable for discharge from the hospital within 2 midnights of admission.    Dolly Rias, MD [facility]  How to contact the [facility] Attending or Consulting provider 7A - 7P or covering provider during after hours 7P -7A, for this patient.  Check the care team in [facility] and look for a) attending/consulting TR[facility] provider listed and b) the [facility] team listed Log into [email or web address] and use [place]'s universal password to access.  If you do not have the password, please contact the hospital operator. Locate the [facility] provider you are looking for under [facility] and page to a number that you can be directly reached. If you still have difficulty reaching the provider, please page the [facility] (Director on Call) for the Hospitalists listed on amion for assistance.  01/01/2024, 3:15 AM

## 2024-01-01 NOTE — Progress Notes (Signed)
No charge note.  Patient seen and examined this morning, admitted overnight, H&P reviewed and I agree with the assessment and plan.  He was brought to the hospital from memory care due to increased confusion, fever, cough.  He is awake this morning, has underlying dementia and unable to contribute to the story, but tells me that he feels "well".  Has been having low-grade temps this morning and 99 range, has been placed on Tamiflu, continue.  Monitor clinical course  Scheduled Meds:  apixaban  5 mg Oral BID   brexpiprazole  3 mg Oral QHS   escitalopram  20 mg Oral Daily   ezetimibe  5 mg Oral Daily   hydrOXYzine  50 mg Oral BID   LORazepam  0.5 mg Oral BID   melatonin  6 mg Oral QHS   metoprolol tartrate  25 mg Oral BID   oseltamivir  75 mg Oral QHS   pantoprazole  40 mg Oral Daily   rosuvastatin  5 mg Oral q morning   sodium chloride flush  3 mL Intravenous Q12H   Continuous Infusions: PRN Meds:.acetaminophen, albuterol, haloperidol lactate, ondansetron (ZOFRAN) IV, polyethylene glycol  Aalina Brege M. Elvera Lennox, MD, PhD [facility]  Between 7 am - 7 pm you can contact me via Amion (for emergencies) or Securechat (non urgent matters).  I am not available 7 pm - 7 am, please contact night coverage MD/APP via Amion

## 2024-01-02 DIAGNOSIS — R531 Weakness: Secondary | ICD-10-CM | POA: Diagnosis not present

## 2024-01-02 DIAGNOSIS — Z7401 Bed confinement status: Secondary | ICD-10-CM | POA: Diagnosis not present

## 2024-01-02 DIAGNOSIS — J1089 Influenza due to other identified influenza virus with other manifestations: Secondary | ICD-10-CM | POA: Diagnosis not present

## 2024-01-02 DIAGNOSIS — J101 Influenza due to other identified influenza virus with other respiratory manifestations: Secondary | ICD-10-CM | POA: Diagnosis not present

## 2024-01-02 LAB — CBC
HCT: 32.2 % — ABNORMAL LOW (ref 39.0–52.0)
Hemoglobin: 10.7 g/dL — ABNORMAL LOW (ref 13.0–17.0)
MCH: 30.8 pg (ref 26.0–34.0)
MCHC: 33.2 g/dL (ref 30.0–36.0)
MCV: 92.8 fL (ref 80.0–100.0)
Platelets: 196 10*3/uL (ref 150–400)
RBC: 3.47 MIL/uL — ABNORMAL LOW (ref 4.22–5.81)
RDW: 14.5 % (ref 11.5–15.5)
WBC: 4.2 10*3/uL (ref 4.0–10.5)
nRBC: 0 % (ref 0.0–0.2)

## 2024-01-02 LAB — COMPREHENSIVE METABOLIC PANEL
ALT: 33 U/L (ref 0–44)
AST: 35 U/L (ref 15–41)
Albumin: 3 g/dL — ABNORMAL LOW (ref 3.5–5.0)
Alkaline Phosphatase: 51 U/L (ref 38–126)
Anion gap: 10 (ref 5–15)
BUN: 21 mg/dL (ref 8–23)
CO2: 20 mmol/L — ABNORMAL LOW (ref 22–32)
Calcium: 8.4 mg/dL — ABNORMAL LOW (ref 8.9–10.3)
Chloride: 105 mmol/L (ref 98–111)
Creatinine, Ser: 0.76 mg/dL (ref 0.61–1.24)
GFR, Estimated: >60 mL/min (ref >60)
Glucose, Bld: 112 mg/dL — ABNORMAL HIGH (ref 70–99)
Potassium: 3.4 mmol/L — ABNORMAL LOW (ref 3.5–5.1)
Sodium: 135 mmol/L (ref 135–145)
Total Bilirubin: 0.5 mg/dL (ref 0.0–1.2)
Total Protein: 6.6 g/dL (ref 6.5–8.1)

## 2024-01-02 LAB — MAGNESIUM: Magnesium: 1.9 mg/dL (ref 1.7–2.4)

## 2024-01-02 MED ORDER — LORAZEPAM 0.5 MG PO TABS
0.5000 mg | ORAL_TABLET | Freq: Two times a day (BID) | ORAL | 0 refills | Status: DC
Start: 2024-01-02 — End: 2024-01-02

## 2024-01-02 MED ORDER — OSELTAMIVIR PHOSPHATE 75 MG PO CAPS: 75.0000 mg | ORAL_CAPSULE | Freq: Every day | ORAL | 0 refills | Status: DC

## 2024-01-02 MED ORDER — OSELTAMIVIR PHOSPHATE 75 MG PO CAPS: 75.0000 mg | ORAL_CAPSULE | Freq: Every day | ORAL | 0 refills | Status: AC

## 2024-01-02 MED ORDER — POTASSIUM CHLORIDE CRYS ER 20 MEQ PO TBCR
40.0000 meq | EXTENDED_RELEASE_TABLET | Freq: Once | ORAL | Status: AC
  Administered 2024-01-02: 40 meq via ORAL
  Filled 2024-01-02: qty 2

## 2024-01-02 MED ORDER — LORAZEPAM 0.5 MG PO TABS: 0.5000 mg | ORAL_TABLET | Freq: Two times a day (BID) | ORAL | 0 refills | Status: DC

## 2024-01-02 NOTE — Discharge Summary (Signed)
Physician Discharge Summary  [name] [facility] ZOX:[id number] DOB: [birth year] DOA: 12/31/2023  PCP: [name], MD  Admit date: 12/31/2023 Discharge date: 01/02/2024  Admitted From: SNF memory care Disposition:  SNF memory care  Recommendations for Outpatient Follow-up:  Follow up with PCP in 1-2 weeks  Home Health: none Equipment/Devices: none  Discharge Condition: stable CODE STATUS: DNR Diet Orders (From admission, onward)     Start     Ordered   01/01/24 0301  Diet regular Room service appropriate? Yes; Fluid consistency: Thin  Diet effective now       Question Answer Comment  Room service appropriate? Yes   Fluid consistency: Thin      01/01/24 0301            HPI: Per admitting MD, [name] is a 89 y.o. male with hx of dementia with behavioral disturbance, CAD with hx prior STEMI and multiple PCI, Afib on anticoagulation, prior hx SDH, HTN, HLD, RA, Mood disorder, hx falls, OSA on CPAP, who was brought in from memory care due to AMS, fever, cough, hypoxia Patient unable to provide significant history although awake and interactive. Per ED, " He presents emergency department with notable fever, hypoxia and was hypotensive to a blood pressure of 90 systolic with EMS prior to arrival. He received 1 L on his way here. Patient is unable to give history. He has apparently had a productive cough for the past few days. Staff noticed that he was more lethargic and he vomited while he was at lunchtime today. Unsure if this was posttussive. He has DNR paperwork with him. Currently on 2 L via nasal cannula.   Hospital Course / Discharge diagnoses: Principal Problem:   Influenza A Active Problems:   Hypovolemia   Acute hypoxic respiratory failure (HCC)   Acute encephalopathy   Principal problem Acute hypoxic respiratory failure in the setting of influenza A -patient was admitted to the hospital with influenza A and mild hypoxia, required 2 L on admission.  He was placed on  Tamiflu.  With supportive treatment, he has improved, has been weaned off to room air and has remained stable.  He was febrile initially but this is now resolved.  He will be discharged home to his SNF/memory care unit in stable condition, has 3 additional days of Tamiflu remaining  Active problems Hypovolemia-resolved with fluids Encephalopathy, related to influenza A, underlying dementia-at baseline CAD, prior PCI-continue home medications, no chest pain PAF-continue home medications Essential hypertension-continue home medications RA-no longer on disease modifying agents History of SDH-defer anticoagulation or holding off, to outpatient treatment team  Sepsis ruled out   Discharge Instructions   Allergies as of 01/02/2024       Reactions   Methotrexate Other (See Comments)   REACTION: tachycardia   Aricept [donepezil Hcl] Other (See Comments)   Nightmares. Pt takes this medication per [facility]    Crestor [rosuvastatin Calcium] Other (See Comments)   Myalgias with 20mg  dose   Lisinopril Other (See Comments)   cough   Namenda [memantine] Diarrhea, Nausea Only   Atorvastatin Other (See Comments)   Muscle pain, leg cramps   Depakote [divalproex Sodium] Rash   Allergy not listed on MAR    Pravastatin Sodium Other (See Comments)   REACTION: cramps, fatigue        Medication List     TAKE these medications    acetaminophen 650 MG suppository Commonly known as: TYLENOL Place 650 mg rectally every 6 (six) hours as  needed for mild pain (pain score 1-3) or moderate pain (pain score 4-6). What changed: Another medication with the same name was changed. Make sure you understand how and when to take each.   acetaminophen 325 MG tablet Commonly known as: Tylenol Take 2 tablets (650 mg total) by mouth in the morning and at bedtime. What changed: additional instructions   apixaban 5 MG Tabs tablet Commonly known as: Eliquis Take 1 tablet (5 mg total) by mouth 2 (two) times  daily.   camphor-menthol lotion Commonly known as: SARNA Apply 1 Application topically 2 (two) times daily as needed for itching.   cetaphil cream Apply topically daily at 6 (six) AM. What changed:  how much to take when to take this   docusate sodium 100 MG capsule Commonly known as: COLACE Take 1 capsule (100 mg total) by mouth 2 (two) times daily.   escitalopram 20 MG tablet Commonly known as: LEXAPRO Take 20 mg by mouth daily.   ezetimibe 10 MG tablet Commonly known as: ZETIA Take 5 mg by mouth daily.   haloperidol 1 MG tablet Commonly known as: HALDOL Take 1 mg by mouth daily as needed for agitation.   hydrOXYzine 25 MG capsule Commonly known as: VISTARIL Take 25 mg by mouth in the morning and at bedtime.   LORazepam 0.5 MG tablet Commonly known as: Ativan Take 1 tablet (0.5 mg total) by mouth 2 (two) times daily.   melatonin 5 MG Tabs Take 5 mg by mouth at bedtime.   methocarbamol 500 MG tablet Commonly known as: ROBAXIN Take 1 tablet (500 mg total) by mouth every 6 (six) hours as needed for muscle spasms.   metoprolol tartrate 25 MG tablet Commonly known as: LOPRESSOR Take 25 mg by mouth 2 (two) times daily. Hold for SBP <100   multivitamin with minerals Tabs tablet Take 1 tablet by mouth daily.   nitroGLYCERIN 0.4 MG SL tablet Commonly known as: NITROSTAT Place 0.4 mg under the tongue every 5 (five) minutes as needed for chest pain.   omeprazole 40 MG capsule Commonly known as: PRILOSEC Take 40 mg by mouth in the morning. Take on empty stomach 30 minutes before breakfast.   oseltamivir 75 MG capsule Commonly known as: TAMIFLU Take 1 capsule (75 mg total) by mouth at bedtime for 3 days.   Repatha SureClick 140 MG/ML Soaj Generic drug: Evolocumab INJECT 1 PEN INTO SKIN EVERY 14 DAYS   Rexulti 3 MG Tabs Generic drug: Brexpiprazole Take 3 mg by mouth at bedtime.   rosuvastatin 5 MG tablet Commonly known as: CRESTOR Take 5 mg by mouth every  morning.   triamcinolone cream 0.1 % Commonly known as: KENALOG Apply 1 Application topically daily as needed (rash / itching).       Consultations: none  Procedures/Studies:  DG Chest Port 1 View Result Date: 01/01/2024 CLINICAL DATA:  Questionable sepsis - evaluate for abnormality EXAM: PORTABLE CHEST 1 VIEW COMPARISON:  Chest x-ray 06/15/2023 FINDINGS: The heart and mediastinal contours are unchanged. Atherosclerotic plaque. No focal consolidation. No pulmonary edema. No pleural effusion. No pneumothorax. No acute osseous abnormality.  Old healed left rib fractures. IMPRESSION: 1. No active disease. 2.  Aortic Atherosclerosis (ICD10-I70.0). Electronically Signed   By: Tish Frederickson M.D.   On: 01/01/2024 00:37     Subjective: - no chest pain, shortness of breath, no abdominal pain, nausea or vomiting.   Discharge Exam: BP 127/64   Pulse 60   Temp (!) 97.4 F (36.3 C)  Resp 16   SpO2 96%   General: Pt is alert, awake, not in acute distress Cardiovascular: RRR, S1/S2 +, no rubs, no gallops Respiratory: CTA bilaterally, no wheezing, no rhonchi Abdominal: Soft, NT, ND, bowel sounds + Extremities: no edema, no cyanosis    The results of significant diagnostics from this hospitalization (including imaging, microbiology, ancillary and laboratory) are listed below for reference.     Microbiology: Recent Results (from the past 240 hours)  Resp panel by RT-PCR (RSV, Flu A&B, Covid) Anterior Nasal Swab     Status: Abnormal   Collection Time: 12/31/23 11:39 PM   Specimen: Anterior Nasal Swab  Result Value Ref Range Status   SARS Coronavirus 2 by RT PCR NEGATIVE NEGATIVE Final    Comment: (NOTE) SARS-CoV-2 target nucleic acids are NOT DETECTED.  The SARS-CoV-2 RNA is generally detectable in upper respiratory specimens during the acute phase of infection. The lowest concentration of SARS-CoV-2 viral copies this assay can detect is 138 copies/mL. A negative result does not  preclude SARS-Cov-2 infection and should not be used as the sole basis for treatment or other patient management decisions. A negative result may occur with  improper specimen collection/handling, submission of specimen other than nasopharyngeal swab, presence of viral mutation(s) within the areas targeted by this assay, and inadequate number of viral copies(<138 copies/mL). A negative result must be combined with clinical observations, patient history, and epidemiological information. The expected result is Negative.  Fact Sheet for Patients:  BloggerCourse.com  Fact Sheet for Healthcare Providers:  SeriousBroker.it  This test is no t yet approved or cleared by the [place] FDA and  has been authorized for detection and/or diagnosis of SARS-CoV-2 by FDA under an Emergency Use Authorization (EUA). This EUA will remain  in effect (meaning this test can be used) for the duration of the COVID-19 declaration under Section 564(b)(1) of the Act, 21 U.S.C.section 360bbb-3(b)(1), unless the authorization is terminated  or revoked sooner.       Influenza A by PCR POSITIVE (A) NEGATIVE Final   Influenza B by PCR NEGATIVE NEGATIVE Final    Comment: (NOTE) The Xpert Xpress SARS-CoV-2/FLU/RSV plus assay is intended as an aid in the diagnosis of influenza from Nasopharyngeal swab specimens and should not be used as a sole basis for treatment. Nasal washings and aspirates are unacceptable for Xpert Xpress SARS-CoV-2/FLU/RSV testing.  Fact Sheet for Patients: BloggerCourse.com  Fact Sheet for Healthcare Providers: SeriousBroker.it  This test is not yet approved or cleared by the [place] FDA and has been authorized for detection and/or diagnosis of SARS-CoV-2 by FDA under an Emergency Use Authorization (EUA). This EUA will remain in effect (meaning this test can be used) for the  duration of the COVID-19 declaration under Section 564(b)(1) of the Act, 21 U.S.C. section 360bbb-3(b)(1), unless the authorization is terminated or revoked.     Resp Syncytial Virus by PCR NEGATIVE NEGATIVE Final    Comment: (NOTE) Fact Sheet for Patients: BloggerCourse.com  Fact Sheet for Healthcare Providers: SeriousBroker.it  This test is not yet approved or cleared by the [place] FDA and has been authorized for detection and/or diagnosis of SARS-CoV-2 by FDA under an Emergency Use Authorization (EUA). This EUA will remain in effect (meaning this test can be used) for the duration of the COVID-19 declaration under Section 564(b)(1) of the Act, 21 U.S.C. section 360bbb-3(b)(1), unless the authorization is terminated or revoked.  Performed at [facility], [address], [place], [place] [address]  Blood Culture (routine x 2)     Status: None (Preliminary result)   Collection Time: 01/01/24 12:07 AM   Specimen: BLOOD LEFT ARM  Result Value Ref Range Status   Specimen Description   Final    BLOOD LEFT ARM Performed at [facility], [address], [place], [place] [address]    Special Requests   Final    BOTTLES DRAWN AEROBIC AND ANAEROBIC Blood Culture adequate volume Performed at [facility], [address] [place], [place] [address]    Culture   Final    NO GROWTH 1 DAY Performed at [facility], [address] [place], [place] [address]    Report Status PENDING  Incomplete  Blood Culture (routine x 2)     Status: None (Preliminary result)   Collection Time: 01/01/24 12:18 AM   Specimen: BLOOD RIGHT ARM  Result Value Ref Range Status   Specimen Description   Final    BLOOD RIGHT ARM Performed at [facility], [address], [place], [place] [address]    Special Requests   Final    BOTTLES DRAWN AEROBIC AND ANAEROBIC Blood Culture adequate  volume Performed at [facility], [address] [place], [place] [address]    Culture   Final    NO GROWTH 1 DAY Performed at [facility], [address] [place], [place] [address]    Report Status PENDING  Incomplete     Labs: Basic Metabolic Panel: Recent Labs  Lab 01/01/24 0004 01/02/24 0557  NA 138 135  K 3.9 3.4*  CL 107 105  CO2 23 20*  GLUCOSE 104* 112*  BUN 20 21  CREATININE 0.89 0.76  CALCIUM 8.5* 8.4*  MG  --  1.9   Liver Function Tests: Recent Labs  Lab 01/01/24 0004 01/02/24 0557  AST 24 35  ALT 29 33  ALKPHOS 55 51  BILITOT 0.5 0.5  PROT 6.6 6.6  ALBUMIN 3.2* 3.0*   CBC: Recent Labs  Lab 01/01/24 0004 01/02/24 0557  WBC 6.1 4.2  NEUTROABS 5.0  --   HGB 11.0* 10.7*  HCT 32.8* 32.2*  MCV 91.9 92.8  PLT 227 196   CBG: No results for input(s): "GLUCAP" in the last 168 hours. Hgb A1c No results for input(s): "HGBA1C" in the last 72 hours. Lipid Profile No results for input(s): "CHOL", "HDL", "LDLCALC", "TRIG", "CHOLHDL", "LDLDIRECT" in the last 72 hours. Thyroid function studies No results for input(s): "TSH", "T4TOTAL", "T3FREE", "THYROIDAB" in the last 72 hours.  Invalid input(s): "FREET3" Urinalysis    Component Value Date/Time   COLORURINE YELLOW 01/01/2024 0208   APPEARANCEUR CLEAR 01/01/2024 0208   LABSPEC 1.025 01/01/2024 0208   PHURINE 6.0 01/01/2024 0208   GLUCOSEU NEGATIVE 01/01/2024 0208   GLUCOSEU NEGATIVE 04/06/2019 1158   HGBUR NEGATIVE 01/01/2024 0208   BILIRUBINUR NEGATIVE 01/01/2024 0208   KETONESUR NEGATIVE 01/01/2024 0208   PROTEINUR 30 (A) 01/01/2024 0208   UROBILINOGEN 1.0 04/06/2019 1158   NITRITE NEGATIVE 01/01/2024 0208   LEUKOCYTESUR NEGATIVE 01/01/2024 0208    FURTHER DISCHARGE INSTRUCTIONS:   Get Medicines reviewed and adjusted: Please take all your medications with you for your next visit with your Primary MD   Laboratory/radiological data: Please request your Primary  MD to go over all hospital tests and procedure/radiological results at the follow up, please ask your Primary MD to get all Hospital records sent to his/her office.   In some cases,  they will be blood work, cultures and biopsy results pending at the time of your discharge. Please request that your primary care M.D. goes through all the records of your hospital data and follows up on these results.   Also Note the following: If you experience worsening of your admission symptoms, develop shortness of breath, life threatening emergency, suicidal or homicidal thoughts you must seek medical attention immediately by calling 911 or calling your MD immediately  if symptoms less severe.   You must read complete instructions/literature along with all the possible adverse reactions/side effects for all the Medicines you take and that have been prescribed to you. Take any new Medicines after you have completely understood and accpet all the possible adverse reactions/side effects.    Do not drive when taking Pain medications or sleeping medications (Benzodaizepines)   Do not take more than prescribed Pain, Sleep and Anxiety Medications. It is not advisable to combine anxiety,sleep and pain medications without talking with your primary care practitioner   Special Instructions: If you have smoked or chewed Tobacco  in the last 2 yrs please stop smoking, stop any regular Alcohol  and or any Recreational drug use.   Wear Seat belts while driving.   Please note: You were cared for by a hospitalist during your hospital stay. Once you are discharged, your primary care physician will handle any further medical issues. Please note that NO REFILLS for any discharge medications will be authorized once you are discharged, as it is imperative that you return to your primary care physician (or establish a relationship with a primary care physician if you do not have one) for your post hospital discharge needs so that they  can reassess your need for medications and monitor your lab values.  Time coordinating discharge: 35 minutes  SIGNED:  Pamella Pert, MD, PhD 01/02/2024, 12:16 PM

## 2024-01-02 NOTE — [place] FL2 (Signed)
[place] MEDICAID FL2 LEVEL OF CARE FORM     IDENTIFICATION  Patient Name: [name] Birthdate: Apr 03, 1933 Sex: male Admission Date (Current Location): 12/31/2023  [facility] [place] Number:  Producer, television/film/video and Address:  [facility],  [address] [place], [place] [address]      Provider Number: [id number]  Attending Physician Name and Address:  Leatha Gilding, MD  Relative Name and Phone Number:  [name], Kresse (Spouse)  [phone]    Current Level of Care: Hospital Recommended Level of Care: Memory Care Prior Approval Number:    Date Approved/Denied:   PASRR Number:    Discharge Plan: Other (Comment) (Memory Care)    Current Diagnoses: Patient Active Problem List   Diagnosis Date Noted   Influenza A 01/01/2024   Hypovolemia 01/01/2024   Acute hypoxic respiratory failure (HCC) 01/01/2024   Acute encephalopathy 01/01/2024   Acetabular fracture (HCC) 03/28/2023   Closed stable fracture of multiple pubic rami (HCC) 03/28/2023   DNR (do not resuscitate) 03/28/2023   Subdural hematoma (HCC) 10/02/2022   DDD (degenerative disc disease), cervical 01/19/2022   Atherosclerosis of aorta (HCC) 03/02/2021   Near syncope 09/04/2020   Angular stomatitis 07/09/2019   Cervical radiculopathy 06/05/2019   Pain in joint of left shoulder 05/31/2019   Confusion 04/05/2019   Alzheimer's dementia (HCC) 12/21/2018   Nausea & vomiting 02/07/2018   Rash 02/07/2018   Insomnia 09/22/2017   Cervical spondylolysis 06/28/2017   Hydronephrosis 11/18/2016   UPJ obstruction, 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   Allergic rhinitis 06/02/2016   Asthma with exacerbation 06/02/2016   Sinusitis, chronic 10/17/2015   OSA (obstructive sleep apnea) 06/19/2015   Paroxysmal atrial fibrillation (HCC) 02/21/2015   Actinic keratoses 04/26/2014   CAD (coronary artery disease) 02/25/2014   STEMI (ST elevation myocardial infarction) (HCC) 01/08/2014   Rheumatoid bursitis of left elbow (HCC)  08/08/2013   Situational depression 06/18/2013   Neck pain 04/23/2012   Hypertension 09/23/2011   Midsternal chest pain 09/13/2011   Mixed hyperlipidemia 07/23/2010   Palpitations 07/07/2010   Rheumatoid arthritis (HCC) 06/04/2010   PARESTHESIA 06/04/2010   Pain in joint 11/27/2009   MYALGIA 08/21/2009   ALLERGIC RHINITIS 06/11/2008   OSTEOARTHRITIS 06/11/2008   LOW BACK PAIN 07/27/2007   History of colonic polyps 07/27/2007    Orientation RESPIRATION BLADDER Height & Weight     Self  Normal Incontinent Weight:   Height:     BEHAVIORAL SYMPTOMS/MOOD NEUROLOGICAL BOWEL NUTRITION STATUS      Incontinent Diet (Regular)  AMBULATORY STATUS COMMUNICATION OF NEEDS Skin   Limited Assist Verbally Normal                       Personal Care Assistance Level of Assistance  Bathing, Feeding, Dressing Bathing Assistance: Limited assistance Feeding assistance: Limited assistance Dressing Assistance: Limited assistance     Functional Limitations Info  Sight, Hearing, Speech Sight Info: Impaired Hearing Info: Impaired Speech Info: Adequate    SPECIAL CARE FACTORS FREQUENCY                       Contractures Contractures Info: Not present    Additional Factors Info  Code Status, Allergies, Psychotropic, Isolation Precautions Code Status Info: DNR Allergies Info: Methotrexate, Aricept (Donepezil Hcl), Crestor (Rosuvastatin Calcium), Lisinopril, Namenda (Memantine), Atorvastatin, Depakote (Divalproex Sodium), Pravastatin Sodium Psychotropic Info: See MAR   Isolation Precautions Info: Influenza A     Current Medications (01/02/2024):  This is the current  hospital active medication list Current Facility-Administered Medications  Medication Dose Route Frequency Provider Last Rate Last Admin   acetaminophen (TYLENOL) tablet 1,000 mg  1,000 mg Oral Q6H PRN Dolly Rias, MD   1,000 mg at 01/01/24 1332   albuterol (PROVENTIL) (2.5 MG/3ML) 0.083% nebulizer solution 2.5  mg  2.5 mg Nebulization Q4H PRN Segars, Christiane Ha, MD       apixaban Everlene Balls) tablet 5 mg  5 mg Oral BID Dolly Rias, MD   5 mg at 01/02/24 4098   brexpiprazole (REXULTI) tablet 3 mg  3 mg Oral QHS Dolly Rias, MD   3 mg at 01/01/24 2128   escitalopram (LEXAPRO) tablet 20 mg  20 mg Oral Daily Dolly Rias, MD   20 mg at 01/02/24 1191   ezetimibe (ZETIA) tablet 5 mg  5 mg Oral Daily Segars, Christiane Ha, MD   5 mg at 01/02/24 4782   haloperidol lactate (HALDOL) injection 1 mg  1 mg Intravenous Q6H PRN Segars, Christiane Ha, MD       hydrOXYzine (ATARAX) tablet 50 mg  50 mg Oral BID Dolly Rias, MD   50 mg at 01/01/24 1711   LORazepam (ATIVAN) tablet 0.5 mg  0.5 mg Oral BID Dolly Rias, MD   0.5 mg at 01/02/24 0846   melatonin tablet 6 mg  6 mg Oral QHS Dolly Rias, MD   6 mg at 01/01/24 2129   metoprolol tartrate (LOPRESSOR) tablet 25 mg  25 mg Oral BID Dolly Rias, MD   25 mg at 01/01/24 2129   ondansetron (ZOFRAN) injection 4 mg  4 mg Intravenous Q6H PRN Segars, Christiane Ha, MD       oseltamivir (TAMIFLU) capsule 75 mg  75 mg Oral QHS Dolly Rias, MD   75 mg at 01/01/24 2129   pantoprazole (PROTONIX) EC tablet 40 mg  40 mg Oral Daily Segars, Christiane Ha, MD   40 mg at 01/02/24 9562   polyethylene glycol (MIRALAX / GLYCOLAX) packet 17 g  17 g Oral Daily PRN Dolly Rias, MD       rosuvastatin (CRESTOR) tablet 5 mg  5 mg Oral q morning Segars, Christiane Ha, MD   5 mg at 01/02/24 1308   sodium chloride flush (NS) 0.9 % injection 3 mL  3 mL Intravenous Q12H Dolly Rias, MD   3 mL at 01/02/24 6578     Discharge Medications: Please see discharge summary for a list of discharge medications.  Relevant Imaging Results:  Relevant Lab Results:   Additional Information SS#: [id number]  Otelia Santee, LCSW

## 2024-01-02 NOTE — Progress Notes (Signed)
Report is give to [facility].

## 2024-01-02 NOTE — TOC Transition Note (Signed)
Transition of Care [facility]) - Discharge Note   Patient Details  Name: [name] MRN: [id number] Date of Birth: 20-Jun-1933  Transition of Care [facility]) CM/SW Contact:  Otelia Santee, LCSW Phone Number: 01/02/2024, 12:45 PM   Clinical Narrative:    Pt to return to [facility]. Pt will be going to [facility] room 304. RN to call report to [phone]. Spoke with pt's daughter and confirmed this plan. DC packet with signed scripts and DNR placed at RN station. PTAR called at 1:00pm for transportation.    Final next level of care: Memory Care Barriers to Discharge: No Barriers Identified   Patient Goals and CMS Choice Patient states their goals for this hospitalization and ongoing recovery are:: For pt to return to [email or web address].gov Compare Post Acute Care list provided to:: Patient Represenative (must comment) Choice offered to / list presented to : Spouse, Adult Children  ownership interest in [facility].provided to::  (NA)    Discharge Placement              Patient chooses bed at: [facility] Patient to be transferred to facility by: PTAR Name of family member notified: Daughter Patient and family notified of of transfer: 01/02/24  Discharge Plan and Services Additional resources added to the After Visit Summary for                  DME Arranged: N/A DME Agency: NA                  Social Drivers of Health (SDOH) Interventions SDOH Screenings   Food Insecurity: No Food Insecurity (03/28/2023)  Housing: Low Risk  (03/28/2023)  Transportation Needs: No Transportation Needs (03/28/2023)  Utilities: Not At Risk (03/28/2023)  Alcohol Screen: Low Risk  (06/25/2022)  Depression (PHQ2-9): Low Risk  (08/18/2023)  Financial Resource Strain: Low Risk  (06/25/2022)  Physical Activity: Insufficiently Active (06/25/2022)  Social Connections: Moderately Integrated (06/25/2022)  Stress: No Stress Concern Present (06/25/2022)  Tobacco  Use: Medium Risk (01/01/2024)     Readmission Risk Interventions     No data to display

## 2024-01-02 NOTE — Care Management Obs Status (Signed)
MEDICARE OBSERVATION STATUS NOTIFICATION   Patient Details  Name: [name] MRN: [id number] Date of Birth: 1933/01/24   Medicare Observation Status Notification Given:  Yes   Pt oriented x 1. MOON telephonically reviewed with pt's spouse who was offsite. Verbal permission granted to sign form. Per spouse request copy of MOON left in pt's room for family to pick up prior to patients discharge.   Otelia Santee, LCSW 01/02/2024, 10:31 AM

## 2024-01-03 ENCOUNTER — Non-Acute Institutional Stay (SKILLED_NURSING_FACILITY): Payer: Self-pay | Admitting: Orthopedic Surgery

## 2024-01-03 ENCOUNTER — Encounter: Payer: Self-pay | Admitting: Orthopedic Surgery

## 2024-01-03 DIAGNOSIS — E876 Hypokalemia: Secondary | ICD-10-CM

## 2024-01-03 DIAGNOSIS — I10 Essential (primary) hypertension: Secondary | ICD-10-CM

## 2024-01-03 DIAGNOSIS — F02C3 Dementia in other diseases classified elsewhere, severe, with mood disturbance: Secondary | ICD-10-CM

## 2024-01-03 DIAGNOSIS — J101 Influenza due to other identified influenza virus with other respiratory manifestations: Secondary | ICD-10-CM | POA: Diagnosis not present

## 2024-01-03 DIAGNOSIS — I480 Paroxysmal atrial fibrillation: Secondary | ICD-10-CM | POA: Diagnosis not present

## 2024-01-03 DIAGNOSIS — K219 Gastro-esophageal reflux disease without esophagitis: Secondary | ICD-10-CM

## 2024-01-03 DIAGNOSIS — M069 Rheumatoid arthritis, unspecified: Secondary | ICD-10-CM

## 2024-01-03 DIAGNOSIS — G308 Other Alzheimer's disease: Secondary | ICD-10-CM

## 2024-01-03 DIAGNOSIS — F3341 Major depressive disorder, recurrent, in partial remission: Secondary | ICD-10-CM | POA: Diagnosis not present

## 2024-01-03 NOTE — Progress Notes (Unsigned)
Location:  [facility] Room Number: 306/A Place of Service:  SNF [id number]) Provider:  Octavia Heir, NP   [name], MD  Patient Care Team: [name], MD as PCP - General (Internal Medicine) Quintella Reichert, MD as PCP - Cardiology (Cardiology) Hillis Range, MD (Inactive) as PCP - Electrophysiology (Cardiology) Hilarie Fredrickson, MD (Gastroenterology) Hillis Range, MD (Inactive) (Cardiology) Donnetta Hail, MD as Consulting Physician (Rheumatology) Berneice Heinrich Delbert Phenix., MD as Consulting Physician (Urology) Antony Contras, MD as Consulting Physician (Ophthalmology) Dannielle Burn (Dentistry) Eileen Stanford, MD as Referring Physician (Allergy and Immunology) Van Clines, MD as Consulting Physician (Neurology) Kathyrn Sheriff, [facility] (Inactive) as Pharmacist (Pharmacist) Van Clines, MD as Consulting Physician (Neurology)  Extended Emergency Contact Information Primary Emergency Contact: Middendorf,[name] Address: [address]          [place], [place] Home Phone: [phone] Work Phone: [phone] Mobile Phone: [phone] Relation: Spouse Secondary Emergency Contact: [name],[name] Home Phone: [phone] Mobile Phone: [phone] Relation: Daughter  Code Status:   Goals of care: Advanced Directive information    01/01/2024    4:29 AM  Advanced Directives  Does Patient Have a Medical Advance Directive? Yes  Type of Advance Directive Out of facility DNR (pink MOST or yellow form)  Does patient want to make changes to medical advance directive? No - Patient declined  Pre-existing out of facility DNR order (yellow form or pink MOST form) Pink MOST/Yellow Form most recent copy in chart - Physician notified to receive inpatient order     Chief Complaint  Patient presents with   Hospitalization Follow-up    HPI:  Pt is a 89 y.o. male seen today for medical management of chronic diseases.      Past Medical History:  Diagnosis Date   CAD (coronary artery disease)    a. s/p inferior STEMI 01/08/2014; LHC 01/08/14: total RCA occlusion s/p 3.5x42mm Xience DES distal RCA and 3.5x28 mm DES mid RCA, 60-70% mid LAD stenosis, EF 55%.   Complication of anesthesia    'long to wake up after back surgery " 09/2003   Diverticulosis of colon    GERD (gastroesophageal reflux disease)    History of cellulitis    05-26-2015  LLE   History of colon polyps    1998- benign/  2008 adenomatous    History of kidney stones    2013   History of squamous cell carcinoma excision    2013;  2015;  06-12-2015 right leg/  02/ and 05/ 2017  left ear and left leg   Hydronephrosis, left    Hyperlipidemia    Hypertension    Migraine with aura    OSA on CPAP 06/19/2015   Moderate OSA with AHI 18/hr  per study 05-20-2015   Osteoarthritis    Paroxysmal atrial fibrillation (HCC) 4/16   chads2vasc score is at least 4   Premature atrial contractions    RA (rheumatoid arthritis) [facility])    rheumatologist-  dr Alben Deeds   Sinusitis, chronic 10/17/2015   Wears glasses    Wears hearing aid    bilateral   Past Surgical History:  Procedure Laterality Date   BACK SURGERY     CARDIOVASCULAR STRESS TEST  11/21/2015   Low risk nuclear study w/ a small diaphragmatic attenuation artifact, no ischemia/  normal LV function and wall motion , ef 63%   CATARACT EXTRACTION W/ INTRAOCULAR LENS  IMPLANT, BILATERAL  2015  COLONOSCOPY  last one 06-09-2011   CORONARY ANGIOPLASTY     CYSTOSCOPY W/ RETROGRADES Left 07/12/2016   Procedure: CYSTOSCOPY WITH RETROGRADE PYELOGRAM LEFT URETERAL STENT;  Surgeon: Bjorn Pippin, MD;  Location: [facility];  Service: Urology;  Laterality: Left;   CYSTOSCOPY WITH RETROGRADE PYELOGRAM, URETEROSCOPY AND STENT PLACEMENT Left 08/11/2016   Procedure: CYSTOSCOPY WITH RETROGRADE PYELOGRAM,  DIAGNOSTIC URETEROSCOPY , STENT EXCHANGE;  Surgeon: Sebastian Ache, MD;  Location: [facility];   Service: Urology;  Laterality: Left;   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   severe fibromatosis palm and fingers   LEFT HEART CATHETERIZATION WITH CORONARY ANGIOGRAM N/A 01/08/2014   Procedure: LEFT HEART CATHETERIZATION WITH CORONARY ANGIOGRAM;  Surgeon: Lennette Bihari, MD;  Location: [facility] CATH LAB;  Service: Cardiovascular;  Laterality:N/A;  total/ subtotal RCA/  mLAD 60-70% w/ mid systolic bridging/  preserved global LVF, ef 55%   MOHS SURGERY  x2  feb and may 2017   left ear /  left leg  ([facility])   ORIF ACETABULAR FRACTURE Right 03/29/2023   Procedure: OPEN REDUCTION INTERNAL FIXATION (ORIF) ACETABULAR FRACTURE;  Surgeon: Myrene Galas, MD;  Location: [facility] OR;  Service: Orthopedics;  Laterality: Right;   PERCUTANEOUS CORONARY STENT INTERVENTION (PCI-S)  01/08/2014   Procedure: PERCUTANEOUS CORONARY STENT INTERVENTION (PCI-S);  Surgeon: Lennette Bihari, MD;  Location: [facility] C[facility] LAB;  Service: Cardiovascular;;  DES to mid and distal RCA   POSTERIOR LUMBAR FUSION  10/01/2003   and Laminectomy/ diskectomy  L4 -- S1   ROBOT ASSISTED PYELOPLASTY Left 09/15/2016   Procedure: XI ROBOTIC ASSISTED PYELOPLASTY;  Surgeon: Sebastian Ache, MD;  Location: [facility];  Service: Urology;  Laterality: Left;   TONSILLECTOMY     TRANSTHORACIC ECHOCARDIOGRAM  02/23/2016   mild LVH,  ef 50-55%,  grade 1 diastolic dysfunction/  mild to moderate AV calcification w/ no stenosis or regurg./  trivial MR and TR/  mild PR    Allergies  Allergen Reactions   Methotrexate Other (See Comments)    REACTION: tachycardia   Aricept [Donepezil Hcl] Other (See Comments)    Nightmares. Pt takes this medication per [facility]    Crestor [Rosuvastatin Calcium] Other (See Comments)    Myalgias with 20mg  dose   Lisinopril Other (See Comments)    cough   Namenda [Memantine] Diarrhea and Nausea Only   Atorvastatin Other (See Comments)    Muscle pain, leg cramps   Depakote [Divalproex Sodium] Rash    Allergy not listed on MAR     Pravastatin Sodium Other (See Comments)    REACTION: cramps, fatigue    Outpatient Encounter Medications as of 01/03/2024  Medication Sig   acetaminophen (TYLENOL) 325 MG tablet Take 2 tablets (650 mg total) by mouth in the morning and at bedtime. (Patient taking differently: Take 650 mg by mouth in the morning and at bedtime. PRN order: take 650 mg by mouth three times daily as needed for pain)   acetaminophen (TYLENOL) 650 MG suppository Place 650 mg rectally every 6 (six) hours as needed for mild pain (pain score 1-3) or moderate pain (pain score 4-6).   apixaban (ELIQUIS) 5 MG TABS tablet Take 1 tablet (5 mg total) by mouth 2 (two) times daily.   Brexpiprazole (REXULTI) 3 MG TABS Take 3 mg by mouth at bedtime.   camphor-menthol (SARNA) lotion Apply 1 Application topically 2 (two) times daily as needed for itching.   docusate sodium (COLACE) 100 MG capsule Take 1 capsule (100 mg total) by mouth 2 (  two) times daily.   Emollient (CETAPHIL) cream Apply topically daily at 6 (six) AM. (Patient taking differently: Apply 1 Application topically at bedtime.)   escitalopram (LEXAPRO) 20 MG tablet Take 20 mg by mouth daily.   Evolocumab (REPATHA SURECLICK) 140 MG/ML SOAJ INJECT 1 PEN INTO SKIN EVERY 14 DAYS   ezetimibe (ZETIA) 10 MG tablet Take 5 mg by mouth daily.   haloperidol (HALDOL) 1 MG tablet Take 1 mg by mouth daily as needed for agitation.   hydrOXYzine (VISTARIL) 25 MG capsule Take 25 mg by mouth in the morning and at bedtime.   LORazepam (ATIVAN) 0.5 MG tablet Take 1 tablet (0.5 mg total) by mouth 2 (two) times daily.   melatonin 5 MG TABS Take 5 mg by mouth at bedtime.   methocarbamol (ROBAXIN) 500 MG tablet Take 1 tablet (500 mg total) by mouth every 6 (six) hours as needed for muscle spasms.   metoprolol tartrate (LOPRESSOR) 25 MG tablet Take 25 mg by mouth 2 (two) times daily. Hold for SBP <100   Multiple Vitamin (MULTIVITAMIN WITH MINERALS) TABS tablet Take 1 tablet by mouth daily.    nitroGLYCERIN (NITROSTAT) 0.4 MG SL tablet Place 0.4 mg under the tongue every 5 (five) minutes as needed for chest pain.   omeprazole (PRILOSEC) 40 MG capsule Take 40 mg by mouth in the morning. Take on empty stomach 30 minutes before breakfast.   oseltamivir (TAMIFLU) 75 MG capsule Take 1 capsule (75 mg total) by mouth at bedtime for 3 days.   rosuvastatin (CRESTOR) 5 MG tablet Take 5 mg by mouth every morning.   triamcinolone cream (KENALOG) 0.1 % Apply 1 Application topically daily as needed (rash / itching).   [DISCONTINUED] apixaban (ELIQUIS) 2.5 MG TABS tablet Take 1 tablet (2.5 mg total) by mouth 2 (two) times daily.   No facility-administered encounter medications on file as of 01/03/2024.    Review of Systems  Immunization History  Administered Date(s) Administered   Fluad Quad(high Dose 65+) 07/09/2019, 08/15/2020, 09/16/2021, 07/29/2022, 09/06/2023   Influenza Split 08/10/2011, 08/15/2012   Influenza Whole 08/21/2009, 09/22/2010   Influenza, High Dose Seasonal PF 09/16/2015, 08/18/2016, 08/31/2017, 08/14/2018   Influenza,inj,Quad PF,6+ Mos 08/06/2013   Influenza-Unspecified 08/31/2017   Moderna Covid-19 Vaccine Bivalent Booster 65yrs & up 09/06/2023   PFIZER(Purple Top)SARS-COV-2 Vaccination 12/11/2019, 01/01/2020, 08/12/2020   Pfizer Covid-19 Vaccine Bivalent Booster 84yrs & up 03/11/2023   Pneumococcal Conjugate-13 10/02/2013   Pneumococcal Polysaccharide-23 06/04/2010   Tdap 10/22/2011, 09/22/2022   Zoster Recombinant(Shingrix) 03/09/2017, 05/10/2017   Pertinent  Health Maintenance Due  Topic Date Due   INFLUENZA VACCINE  Completed      11/30/2022    9:57 AM 05/03/2023    4:08 PM 08/15/2023   10:15 AM 08/18/2023    4:26 PM 11/01/2023   12:07 PM  Fall Risk  Falls in the past year? 1 1 0 1 0  Was there an injury with Fall? 1 0 0 1 0  Fall Risk Category Calculator 2 2 0 3 0  Patient at Risk for Falls Due to No Fall Risks  No Fall Risks History of fall(s)   Fall risk  Follow up Falls evaluation completed Falls evaluation completed Falls evaluation completed Falls evaluation completed Falls evaluation completed   Functional Status Survey:    Vitals:   01/03/24 1618  BP: 124/76  Pulse: 62  Resp: 18  Temp: 97.9 F (36.6 C)  SpO2: 98%  Weight: 177 lb 3.2 oz (80.4 kg)  Height: 6' (1.829  m)   Body mass index is 24.03 kg/m. Physical Exam  Labs reviewed: Recent Labs    03/30/23 0633 03/31/23 0219 04/05/23 0000 06/15/23 2050 07/19/23 0000 01/01/24 0004 01/02/24 0557  NA 137 135   < > 141 142 138 135  K 4.0 3.5   < > 3.6 3.8 3.9 3.4*  CL 104 102   < > 105 102 107 105  CO2 25 23   < >  --  22 23 20*  GLUCOSE 148* 112*   < > 105*  --  104* 112*  BUN 16 20   < > 13 12 20 21   CREATININE 0.88 0.77   < > 0.80 0.8 0.89 0.76  CALCIUM 8.2* 8.3*   < >  --  8.6* 8.5* 8.4*  MG 2.0 2.1  --   --   --   --  1.9   < > = values in this interval not displayed.   Recent Labs    06/15/23 2048 07/19/23 0000 01/01/24 0004 01/02/24 0557  AST 17 18 24  35  ALT 16 11 29  33  ALKPHOS 67 81 55 51  BILITOT 0.3  --  0.5 0.5  PROT 6.4*  --  6.6 6.6  ALBUMIN 3.1* 3.6 3.2* 3.0*   Recent Labs    04/16/23 1002 05/09/23 0000 06/15/23 2048 06/15/23 2050 07/19/23 0000 01/01/24 0004 01/02/24 0557  WBC 8.0   < > 6.6  --  7.9 6.1 4.2  NEUTROABS 5.6  --   --   --   --  5.0  --   HGB 10.9*   < > 11.8*   < > 11.9* 11.0* 10.7*  HCT 34.3*   < > 36.3*   < > 35* 32.8* 32.2*  MCV 98.0  --  95.3  --   --  91.9 92.8  PLT 466*   < > 316  --  326 227 196   < > = values in this interval not displayed.   Lab Results  Component Value Date   TSH 0.79 01/26/2023   Lab Results  Component Value Date   HGBA1C 6.4 08/30/2017   Lab Results  Component Value Date   CHOL 126 01/26/2023   HDL 36 01/26/2023   LDLCALC 39 01/26/2023   LDLDIRECT 168.9 05/24/2013   TRIG 254 (A) 01/26/2023   CHOLHDL 4.2 08/04/2020    Significant Diagnostic Results in last 30 days:  DG Chest  Port 1 View Result Date: 01/01/2024 CLINICAL DATA:  Questionable sepsis - evaluate for abnormality EXAM: PORTABLE CHEST 1 VIEW COMPARISON:  Chest x-ray 06/15/2023 FINDINGS: The heart and mediastinal contours are unchanged. Atherosclerotic plaque. No focal consolidation. No pulmonary edema. No pleural effusion. No pneumothorax. No acute osseous abnormality.  Old healed left rib fractures. IMPRESSION: 1. No active disease. 2.  Aortic Atherosclerosis (ICD10-I70.0). Electronically Signed   By: Tish Frederickson M.D.   On: 01/01/2024 00:37    Assessment/Plan There are no diagnoses linked to this encounter.   Family/ staff Communication: ***  Labs/tests ordered:  ***

## 2024-01-05 ENCOUNTER — Emergency Department (HOSPITAL_COMMUNITY): Payer: Medicare Other

## 2024-01-05 ENCOUNTER — Emergency Department (HOSPITAL_COMMUNITY)
Admission: EM | Admit: 2024-01-05 | Discharge: 2024-01-05 | Disposition: A | Discharge status: Home / Self Care | Payer: Medicare Other | Attending: Emergency Medicine | Admitting: Emergency Medicine

## 2024-01-05 DIAGNOSIS — R58 Hemorrhage, not elsewhere classified: Secondary | ICD-10-CM | POA: Diagnosis not present

## 2024-01-05 DIAGNOSIS — Z7901 Long term (current) use of anticoagulants: Secondary | ICD-10-CM | POA: Insufficient documentation

## 2024-01-05 DIAGNOSIS — S0990XA Unspecified injury of head, initial encounter: Secondary | ICD-10-CM | POA: Insufficient documentation

## 2024-01-05 DIAGNOSIS — Y9301 Activity, walking, marching and hiking: Secondary | ICD-10-CM | POA: Diagnosis not present

## 2024-01-05 DIAGNOSIS — W1830XA Fall on same level, unspecified, initial encounter: Secondary | ICD-10-CM | POA: Diagnosis not present

## 2024-01-05 DIAGNOSIS — S01112A Laceration without foreign body of left eyelid and periocular area, initial encounter: Secondary | ICD-10-CM | POA: Insufficient documentation

## 2024-01-05 DIAGNOSIS — S199XXA Unspecified injury of neck, initial encounter: Secondary | ICD-10-CM | POA: Diagnosis not present

## 2024-01-05 DIAGNOSIS — W19XXXA Unspecified fall, initial encounter: Secondary | ICD-10-CM

## 2024-01-05 DIAGNOSIS — R531 Weakness: Secondary | ICD-10-CM | POA: Diagnosis not present

## 2024-01-05 DIAGNOSIS — Z981 Arthrodesis status: Secondary | ICD-10-CM | POA: Diagnosis not present

## 2024-01-05 DIAGNOSIS — I10 Essential (primary) hypertension: Secondary | ICD-10-CM | POA: Diagnosis not present

## 2024-01-05 DIAGNOSIS — R062 Wheezing: Secondary | ICD-10-CM | POA: Diagnosis not present

## 2024-01-05 DIAGNOSIS — Z7401 Bed confinement status: Secondary | ICD-10-CM | POA: Diagnosis not present

## 2024-01-05 DIAGNOSIS — S0181XA Laceration without foreign body of other part of head, initial encounter: Secondary | ICD-10-CM

## 2024-01-05 DIAGNOSIS — G319 Degenerative disease of nervous system, unspecified: Secondary | ICD-10-CM | POA: Diagnosis not present

## 2024-01-05 DIAGNOSIS — S0993XA Unspecified injury of face, initial encounter: Secondary | ICD-10-CM | POA: Diagnosis present

## 2024-01-05 LAB — CBC WITH DIFFERENTIAL/PLATELET
Abs Immature Granulocytes: 0.02 10*3/uL (ref 0.00–0.07)
Basophils Absolute: 0 10*3/uL (ref 0.0–0.1)
Basophils Relative: 1 %
Eosinophils Absolute: 0.1 10*3/uL (ref 0.0–0.5)
Eosinophils Relative: 3 %
HCT: 34.6 % — ABNORMAL LOW (ref 39.0–52.0)
Hemoglobin: 11.7 g/dL — ABNORMAL LOW (ref 13.0–17.0)
Immature Granulocytes: 1 %
Lymphocytes Relative: 38 %
Lymphs Abs: 1.6 10*3/uL (ref 0.7–4.0)
MCH: 30.7 pg (ref 26.0–34.0)
MCHC: 33.8 g/dL (ref 30.0–36.0)
MCV: 90.8 fL (ref 80.0–100.0)
Monocytes Absolute: 0.3 10*3/uL (ref 0.1–1.0)
Monocytes Relative: 8 %
Neutro Abs: 2 10*3/uL (ref 1.7–7.7)
Neutrophils Relative %: 49 %
Platelets: 229 10*3/uL (ref 150–400)
RBC: 3.81 MIL/uL — ABNORMAL LOW (ref 4.22–5.81)
RDW: 13.6 % (ref 11.5–15.5)
WBC: 4.1 10*3/uL (ref 4.0–10.5)
nRBC: 0 % (ref 0.0–0.2)

## 2024-01-05 LAB — BASIC METABOLIC PANEL
Anion gap: 8 (ref 5–15)
BUN: 15 mg/dL (ref 8–23)
CO2: 24 mmol/L (ref 22–32)
Calcium: 8.5 mg/dL — ABNORMAL LOW (ref 8.9–10.3)
Chloride: 107 mmol/L (ref 98–111)
Creatinine, Ser: 0.88 mg/dL (ref 0.61–1.24)
GFR, Estimated: >60 mL/min (ref >60)
Glucose, Bld: 96 mg/dL (ref 70–99)
Potassium: 3.6 mmol/L (ref 3.5–5.1)
Sodium: 139 mmol/L (ref 135–145)

## 2024-01-05 MED ORDER — LIDOCAINE-EPINEPHRINE (PF) 2 %-1:200000 IJ SOLN
10.0000 mL | Freq: Once | INTRAMUSCULAR | Status: AC
  Administered 2024-01-05: 10 mL via INTRADERMAL
  Filled 2024-01-05: qty 20

## 2024-01-05 MED ORDER — SODIUM CHLORIDE 0.9 % IV BOLUS
1000.0000 mL | Freq: Once | INTRAVENOUS | Status: AC
  Administered 2024-01-05: 1000 mL via INTRAVENOUS

## 2024-01-05 NOTE — ED Provider Notes (Signed)
[place] EMERGENCY DEPARTMENT AT [facility] Provider Note   CSN: [id number] Arrival date & time: 01/05/24  1755     History  No chief complaint on file.   [name] is a 89 y.o. male.  89 yo M with a chief plaints of a fall.  Patient was walking and he said he suddenly felt weak and collapsed to the ground.  Patient is in memory care and provides limited history.  He denies any obvious injury.  EMS reported maybe some left hip pain.  Patient denies any hip pain.  Denies chest pain denies back pain denies neck pain.  EMS provides further history and says that he was recently diagnosed with the flu.        Home Medications Prior to Admission medications   Medication Sig Start Date End Date Taking? Authorizing Provider  acetaminophen (TYLENOL) 325 MG tablet Take 2 tablets (650 mg total) by mouth in the morning and at bedtime. Patient taking differently: Take 650 mg by mouth in the morning and at bedtime. PRN order: take 650 mg by mouth three times daily as needed for pain 04/04/23   Fletcher Anon, NP  acetaminophen (TYLENOL) 650 MG suppository Place 650 mg rectally every 6 (six) hours as needed for mild pain (pain score 1-3) or moderate pain (pain score 4-6).    [provider]  apixaban (ELIQUIS) 5 MG TABS tablet Take 1 tablet (5 mg total) by mouth 2 (two) times daily. 11/30/22   [name], MD  Brexpiprazole (REXULTI) 3 MG TABS Take 3 mg by mouth at bedtime.    [provider]  camphor-menthol Wynelle Fanny) lotion Apply 1 Application topically 2 (two) times daily as needed for itching.    [provider]  docusate sodium (COLACE) 100 MG capsule Take 1 capsule (100 mg total) by mouth 2 (two) times daily. 03/31/23   Rolly Salter, MD  Emollient (CETAPHIL) cream Apply topically daily at 6 (six) AM. Patient taking differently: Apply 1 Application topically at bedtime. 09/24/23   Fletcher Anon, NP  escitalopram (LEXAPRO) 20 MG tablet Take 20  mg by mouth daily.    [provider]  Evolocumab (REPATHA SURECLICK) 140 MG/ML SOAJ INJECT 1 PEN INTO SKIN EVERY 14 DAYS 03/30/21   Quintella Reichert, MD  ezetimibe (ZETIA) 10 MG tablet Take 5 mg by mouth daily.    [provider]  haloperidol (HALDOL) 1 MG tablet Take 1 mg by mouth daily as needed for agitation.    [provider]  hydrOXYzine (VISTARIL) 25 MG capsule Take 25 mg by mouth in the morning and at bedtime.    [provider]  LORazepam (ATIVAN) 0.5 MG tablet Take 1 tablet (0.5 mg total) by mouth 2 (two) times daily. 01/02/24   Leatha Gilding, MD  melatonin 5 MG TABS Take 5 mg by mouth at bedtime.    [provider]  methocarbamol (ROBAXIN) 500 MG tablet Take 1 tablet (500 mg total) by mouth every 6 (six) hours as needed for muscle spasms. 03/31/23   Rolly Salter, MD  metoprolol tartrate (LOPRESSOR) 25 MG tablet Take 25 mg by mouth 2 (two) times daily. Hold for SBP <100    [provider]  Multiple Vitamin (MULTIVITAMIN WITH MINERALS) TABS tablet Take 1 tablet by mouth daily. 04/01/23   Rolly Salter, MD  nitroGLYCERIN (NITROSTAT) 0.4 MG SL tablet Place 0.4 mg under the tongue every 5 (five) minutes as needed for chest  pain.    [provider]  omeprazole (PRILOSEC) 40 MG capsule Take 40 mg by mouth in the morning. Take on empty stomach 30 minutes before breakfast.    [provider]  oseltamivir (TAMIFLU) 75 MG capsule Take 1 capsule (75 mg total) by mouth at bedtime for 3 days. 01/02/24 01/05/24  Leatha Gilding, MD  rosuvastatin (CRESTOR) 5 MG tablet Take 5 mg by mouth every morning.    [provider]  triamcinolone cream (KENALOG) 0.1 % Apply 1 Application topically daily as needed (rash / itching).    [provider]  apixaban (ELIQUIS) 2.5 MG TABS tablet Take 1 tablet (2.5 mg total) by mouth 2 (two) times daily. 10/18/22   Fletcher Anon, NP      Allergies    Methotrexate, Aricept  Palma Holter hcl], Crestor [rosuvastatin calcium], Lisinopril, Namenda [memantine], Atorvastatin, Depakote [divalproex sodium], and Pravastatin sodium    Review of Systems   Review of Systems  Physical Exam Updated Vital Signs BP 139/71   Pulse 62   Temp 98.2 F (36.8 C) (Oral)   Resp 12   Ht 6' (1.829 m)   Wt 80.4 kg   SpO2 95%   BMI 24.03 kg/m  Physical Exam Vitals and nursing note reviewed.  Constitutional:      Appearance: He is well-developed.  HENT:     Head: Normocephalic.     Comments: Left frontal laceration Eyes:     Pupils: Pupils are equal, round, and reactive to light.  Neck:     Vascular: No JVD.  Cardiovascular:     Rate and Rhythm: Normal rate and regular rhythm.     Heart sounds: No murmur heard.    No friction rub. No gallop.  Pulmonary:     Effort: No respiratory distress.     Breath sounds: No wheezing.  Abdominal:     General: There is no distension.     Tenderness: There is no abdominal tenderness. There is no guarding or rebound.  Musculoskeletal:        General: Normal range of motion.     Cervical back: Normal range of motion and neck supple.  Skin:    Coloration: Skin is not pale.     Findings: No rash.  Neurological:     Mental Status: He is alert and oriented to person, place, and time.  Psychiatric:        Behavior: Behavior normal.     ED Results / Procedures / Treatments   Labs (all labs ordered are listed, but only abnormal results are displayed) Labs Reviewed  CBC WITH DIFFERENTIAL/PLATELET - Abnormal; Notable for the following components:      Result Value   RBC 3.81 (*)    Hemoglobin 11.7 (*)    HCT 34.6 (*)    All other components within normal limits  BASIC METABOLIC PANEL - Abnormal; Notable for the following components:   Calcium 8.5 (*)    All other components within normal limits    EKG None  Radiology CT Head Wo Contrast Result Date: 01/05/2024 CLINICAL DATA:  Head trauma, minor (Age >= 65y); Neck trauma  (Age >= 65y) EXAM: CT HEAD WITHOUT CONTRAST CT CERVICAL SPINE WITHOUT CONTRAST TECHNIQUE: Multidetector CT imaging of the head and cervical spine was performed following the standard protocol without intravenous contrast. Multiplanar CT image reconstructions of the cervical spine were also generated. RADIATION DOSE REDUCTION: This exam was performed according to the departmental dose-optimization program which includes automated exposure control, adjustment  of the mA and/or kV according to patient size and/or use of iterative reconstruction technique. COMPARISON:  CT head and CT cervical spine April 16, 2023. FINDINGS: CT HEAD FINDINGS Brain: No evidence of acute infarction, hemorrhage, hydrocephalus, extra-axial collection or mass lesion/mass effect. Similar white matter hypodensities, compatible chronic microvascular ischemic disease. Similar cerebral atrophy. Vascular: No hyperdense vessel identified. Calcific atherosclerosis. Skull: No acute fracture. Sinuses/Orbits: Opacified posterior right ethmoid air cell. Otherwise, sinuses are mostly clear. No acute orbital findings. Other: No mastoid effusions. CT CERVICAL SPINE FINDINGS Alignment: No substantial sagittal subluxation. Alignment is similar to the prior. Skull base and vertebrae: No evidence of acute fracture. Vertebral body heights are unchanged. C4-C6 ACDF with solid bony fusion at these levels. Soft tissues and spinal canal: No prevertebral fluid or swelling. No visible canal hematoma. Disc levels: Similar multilevel degenerative change including facet uncovertebral hypertrophy that contributes to varying degrees of neural foraminal stenosis. Upper chest: Visualized lung apices are clear. IMPRESSION: 1. No evidence of acute abnormality intracranially or in the cervical spine. 2. Similar chronic findings, detailed above. Electronically Signed   By: Feliberto Harts M.D.   On: 01/05/2024 18:25   CT Cervical Spine Wo Contrast Result Date:  01/05/2024 CLINICAL DATA:  Head trauma, minor (Age >= 65y); Neck trauma (Age >= 65y) EXAM: CT HEAD WITHOUT CONTRAST CT CERVICAL SPINE WITHOUT CONTRAST TECHNIQUE: Multidetector CT imaging of the head and cervical spine was performed following the standard protocol without intravenous contrast. Multiplanar CT image reconstructions of the cervical spine were also generated. RADIATION DOSE REDUCTION: This exam was performed according to the departmental dose-optimization program which includes automated exposure control, adjustment of the mA and/or kV according to patient size and/or use of iterative reconstruction technique. COMPARISON:  CT head and CT cervical spine April 16, 2023. FINDINGS: CT HEAD FINDINGS Brain: No evidence of acute infarction, hemorrhage, hydrocephalus, extra-axial collection or mass lesion/mass effect. Similar white matter hypodensities, compatible chronic microvascular ischemic disease. Similar cerebral atrophy. Vascular: No hyperdense vessel identified. Calcific atherosclerosis. Skull: No acute fracture. Sinuses/Orbits: Opacified posterior right ethmoid air cell. Otherwise, sinuses are mostly clear. No acute orbital findings. Other: No mastoid effusions. CT CERVICAL SPINE FINDINGS Alignment: No substantial sagittal subluxation. Alignment is similar to the prior. Skull base and vertebrae: No evidence of acute fracture. Vertebral body heights are unchanged. C4-C6 ACDF with solid bony fusion at these levels. Soft tissues and spinal canal: No prevertebral fluid or swelling. No visible canal hematoma. Disc levels: Similar multilevel degenerative change including facet uncovertebral hypertrophy that contributes to varying degrees of neural foraminal stenosis. Upper chest: Visualized lung apices are clear. IMPRESSION: 1. No evidence of acute abnormality intracranially or in the cervical spine. 2. Similar chronic findings, detailed above. Electronically Signed   By: Feliberto Harts M.D.   On:  01/05/2024 18:25    Procedures .Laceration Repair  Date/Time: 01/05/2024 7:24 PM  Performed by: Melene Plan, DO Authorized by: Melene Plan, DO   Consent:    Consent obtained:  Verbal   Consent given by:  Patient   Risks, benefits, and alternatives were discussed: yes     Risks discussed:  Infection, pain, poor cosmetic result and poor wound healing   Alternatives discussed:  No treatment Universal protocol:    Procedure explained and questions answered to patient or proxy's satisfaction: yes     Imaging studies available: yes (CT)     Immediately prior to procedure, a time out was called: yes     Patient identity confirmed:  Verbally  with patient Laceration details:    Location:  Face   Face location:  L eyebrow   Length (cm):  5.2 Pre-procedure details:    Preparation:  Patient was prepped and draped in usual sterile fashion Exploration:    Hemostasis achieved with:  Epinephrine and direct pressure   Imaging obtained comment:  CT   Imaging outcome: foreign body not noted     Wound extent: no underlying fracture     Contaminated: no   Treatment:    Area cleansed with:  Povidone-iodine   Amount of cleaning:  Standard   Irrigation solution:  Sterile saline   Irrigation volume:  50   Irrigation method:  Pressure wash   Debridement:  None   Undermining:  None   Scar revision: no   Skin repair:    Repair method:  Sutures   Suture size:  5-0   Suture material:  Fast-absorbing gut   Suture technique:  Simple interrupted   Number of sutures:  5 Approximation:    Approximation:  Close Repair type:    Repair type:  Simple Post-procedure details:    Dressing:  Antibiotic ointment and adhesive bandage   Procedure completion:  Tolerated well, no immediate complications     Medications Ordered in ED Medications  sodium chloride 0.9 % bolus 1,000 mL (1,000 mLs Intravenous New Bag/Given 01/05/24 1824)  lidocaine-EPINEPHrine (XYLOCAINE W/EPI) 2 %-1:200000 (PF) injection 10 mL  (10 mLs Intradermal Given 01/05/24 1826)    ED Course/ Medical Decision Making/ A&P                                 Medical Decision Making Amount and/or Complexity of Data Reviewed Labs: ordered. Radiology: ordered. ECG/medicine tests: ordered.  Risk Prescription drug management.   89 yO M with a chief complaint of a fall.  Patient lives in a memory care center.  He was actually recently admitted to this hospital for influenza.  Was just discharged about 3 days ago.  Staff went to check on him and found him on the ground.  Has a laceration above the left eye.  Will obtain CT imaging.  Placed blood work.  Bolus of IV fluids.  EKG.  Reassess.  CT of the head without acute intracranial hemorrhage.  CT of the C-spine without obvious C-spine fracture.  Blood work resulted without acute anemia, no significant electrolyte abnormalities.  Wound was repaired at bedside.  On my record review the patient had his Tdap updated within the past 5 years.  Will discharge home.  PCP follow-up.  7:26 PM:  I have discussed the diagnosis/risks/treatment options with the patient.  Evaluation and diagnostic testing in the emergency department does not suggest an emergent condition requiring admission or immediate intervention beyond what has been performed at this time.  They will follow up with PCP. We also discussed returning to the ED immediately if new or worsening sx occur. We discussed the sx which are most concerning (e.g., sudden worsening pain, fever, inability to tolerate by mouth, redness, drainage) that necessitate immediate return. Medications administered to the patient during their visit and any new prescriptions provided to the patient are listed below.  Medications given during this visit Medications  sodium chloride 0.9 % bolus 1,000 mL (1,000 mLs Intravenous New Bag/Given 01/05/24 1824)  lidocaine-EPINEPHrine (XYLOCAINE W/EPI) 2 %-1:200000 (PF) injection 10 mL (10 mLs Intradermal Given  01/05/24 1826)     The patient appears  reasonably screen and/or stabilized for discharge and I doubt any other medical condition or other [facility] requiring further screening, evaluation, or treatment in the ED at this time prior to discharge.          Final Clinical Impression(s) / ED Diagnoses Final diagnoses:  Fall, initial encounter  Facial laceration, initial encounter    Rx / DC Orders ED Discharge Orders     None         Melene Plan, DO 01/05/24 1926

## 2024-01-05 NOTE — Discharge Instructions (Signed)
Return for redness drainage or if you get a fever.  The sutures that were used are dissolvable that should dissolve between day 3 and day 5.  If they are still there after day 7 then you can gently plucked them out with tweezers.  The area can get wet but not fully immersed underwater.  No scrubbing.  If you really want to clean it you can apply a half-and-half hydrogen peroxide solution with water on a Q-tip.  You can apply an ointment a couple times a day this could be as simple as Vaseline but could also be an antibiotic ointment if you wish.  Once it is healed please try to avoid prolonged sun exposure use sunscreen.  Gells that have silicone antigens have been shown to reduce scarring in some research.

## 2024-01-05 NOTE — Progress Notes (Signed)
   01/05/24 1800  Spiritual Encounters  Type of Visit Initial  Care provided to: Pt not available  Referral source Trauma page  Reason for visit Trauma  OnCall Visit No   Chaplain responded to a level two trauma. The patient [name] was attended to by the medical team.  No family is present. If a chaplain is requested someone will respond.   Valerie Roys [facility]  [phone]

## 2024-01-06 ENCOUNTER — Encounter: Payer: Self-pay | Admitting: Adult Health

## 2024-01-06 ENCOUNTER — Non-Acute Institutional Stay (SKILLED_NURSING_FACILITY): Payer: Medicare Other | Admitting: Adult Health

## 2024-01-06 DIAGNOSIS — G4733 Obstructive sleep apnea (adult) (pediatric): Secondary | ICD-10-CM | POA: Diagnosis not present

## 2024-01-06 DIAGNOSIS — G308 Other Alzheimer's disease: Secondary | ICD-10-CM

## 2024-01-06 DIAGNOSIS — I10 Essential (primary) hypertension: Secondary | ICD-10-CM | POA: Diagnosis not present

## 2024-01-06 DIAGNOSIS — I480 Paroxysmal atrial fibrillation: Secondary | ICD-10-CM

## 2024-01-06 DIAGNOSIS — W19XXXD Unspecified fall, subsequent encounter: Secondary | ICD-10-CM | POA: Diagnosis not present

## 2024-01-06 DIAGNOSIS — F02C3 Dementia in other diseases classified elsewhere, severe, with mood disturbance: Secondary | ICD-10-CM | POA: Diagnosis not present

## 2024-01-06 DIAGNOSIS — F4323 Adjustment disorder with mixed anxiety and depressed mood: Secondary | ICD-10-CM | POA: Diagnosis not present

## 2024-01-06 DIAGNOSIS — J101 Influenza due to other identified influenza virus with other respiratory manifestations: Secondary | ICD-10-CM | POA: Diagnosis not present

## 2024-01-06 DIAGNOSIS — S0181XD Laceration without foreign body of other part of head, subsequent encounter: Secondary | ICD-10-CM | POA: Diagnosis not present

## 2024-01-06 LAB — BASIC METABOLIC PANEL
BUN: 13 (ref 4–21)
CO2: 21 (ref 13–22)
Chloride: 107 (ref 99–108)
Creatinine: 0.7 (ref 0.6–1.3)
Glucose: 72
Potassium: 3.5 meq/L (ref 3.5–5.1)
Sodium: 141 (ref 137–147)

## 2024-01-06 LAB — COMPREHENSIVE METABOLIC PANEL
Calcium: 8.5 — AB (ref 8.7–10.7)
eGFR: 89

## 2024-01-06 LAB — CULTURE, BLOOD (ROUTINE X 2)
Special Requests: ADEQUATE
Special Requests: ADEQUATE

## 2024-01-06 NOTE — Progress Notes (Signed)
Location:  Medical illustrator of Service:  SNF (31) Provider:   Peggye Ley, ANP [facility] [phone]   [name], MD  Patient Care Team: [name], MD as PCP - General (Internal Medicine) Quintella Reichert, MD as PCP - Cardiology (Cardiology) Hillis Range, MD (Inactive) as PCP - Electrophysiology (Cardiology) Hilarie Fredrickson, MD (Gastroenterology) Hillis Range, MD (Inactive) (Cardiology) Donnetta Hail, MD as Consulting Physician (Rheumatology) Berneice Heinrich Delbert Phenix., MD as Consulting Physician (Urology) Antony Contras, MD as Consulting Physician (Ophthalmology) Dannielle Burn (Dentistry) Eileen Stanford, MD as Referring Physician (Allergy and Immunology) Van Clines, MD as Consulting Physician (Neurology) Kathyrn Sheriff, [facility] (Inactive) as Pharmacist (Pharmacist) Van Clines, MD as Consulting Physician (Neurology)  Extended Emergency Contact Information Primary Emergency Contact: Achterberg,[name] Address: [address]          [place], [place] Home Phone: [phone] Work Phone: [phone] Mobile Phone: [phone] Relation: Spouse Secondary Emergency Contact: [name],[name] Home Phone: [phone] Mobile Phone: [phone] Relation: Daughter  Code Status:  DNR Goals of care: Advanced Directive information    01/01/2024    4:29 AM  Advanced Directives  Does Patient Have a Medical Advance Directive? Yes  Type of Advance Directive Out of facility DNR (pink MOST or yellow form)  Does patient want to make changes to medical advance directive? No - Patient declined  Pre-existing out of facility DNR order (yellow form or pink MOST form) Pink MOST/Yellow Form most recent copy in chart - Physician notified to receive inpatient order     Chief Complaint  Patient presents with   Acute Visit    F/u Ed visit     He is an 89 year old male with dementia and atrial fibrillation who  presents after a fall resulting in a laceration to the left eyebrow.  He experienced an unwitnessed fall on January 05, 2024, resulting in a laceration to the left eyebrow. He was found on the floor in his bedroom on his left side. The laceration was treated in the emergency department with five absorbable sutures. A CT scan of the head and neck showed no acute abnormalities but did reveal chronic microvascular ischemic disease and cerebral atrophy. He is on Eliquis, which prompted the CT scan due to the risk of bleeding.  He was hospitalized from February 15 to January 02, 2024, for fever, hypoxia, and borderline low blood pressure. During this time, he had a productive cough and episodes of vomiting. He was diagnosed with influenza A and treated with Tamiflu and Xopenex breathing treatments. He remains on droplet precautions for flu, with symptoms improving.  He has a history of dementia with behaviors and resides in a memory care unit. He is ambulatory but requires extensive assistance and a supportive chair. His dementia is managed with Ativan, Lexapro, PRN Haldol, and Rexulti. His behaviors are fairly well controlled, though he remains a fall risk.  He has atrial fibrillation and is currently on Eliquis. His potassium level was low at 3.4 on January 02, 2024, but improved to 3.6 by January 05, 2024. His hemoglobin was 11.7 on January 05, 2024.  He has a history of sleep apnea and was prescribed a CPAP machine, which he does not use due to his advancing dementia. He experienced cyanosis of the fingers, which resolved, and his oxygen saturation was 96% with a respiratory rate of 14.   Past Medical History:  Diagnosis Date   CAD (  coronary artery disease)    a. s/p inferior STEMI 01/08/2014; LHC 01/08/14: total RCA occlusion s/p 3.5x74mm Xience DES distal RCA and 3.5x28 mm DES mid RCA, 60-70% mid LAD stenosis, EF 55%.   Complication of anesthesia    'long to wake up after back surgery "  09/2003   Diverticulosis of colon    GERD (gastroesophageal reflux disease)    History of cellulitis    05-26-2015  LLE   History of colon polyps    1998- benign/  2008 adenomatous    History of kidney stones    2013   History of squamous cell carcinoma excision    2013;  2015;  06-12-2015 right leg/  02/ and 05/ 2017  left ear and left leg   Hydronephrosis, left    Hyperlipidemia    Hypertension    Migraine with aura    OSA on CPAP 06/19/2015   Moderate OSA with AHI 18/hr  per study 05-20-2015   Osteoarthritis    Paroxysmal atrial fibrillation (HCC) 4/16   chads2vasc score is at least 4   Premature atrial contractions    RA (rheumatoid arthritis) [facility])    rheumatologist-  dr Alben Deeds   Sinusitis, chronic 10/17/2015   Wears glasses    Wears hearing aid    bilateral   Past Surgical History:  Procedure Laterality Date   BACK SURGERY     CARDIOVASCULAR STRESS TEST  11/21/2015   Low risk nuclear study w/ a small diaphragmatic attenuation artifact, no ischemia/  normal LV function and wall motion , ef 63%   CATARACT EXTRACTION W/ INTRAOCULAR LENS  IMPLANT, BILATERAL  2015   COLONOSCOPY  last one 06-09-2011   CORONARY ANGIOPLASTY     CYSTOSCOPY W/ RETROGRADES Left 07/12/2016   Procedure: CYSTOSCOPY WITH RETROGRADE PYELOGRAM LEFT URETERAL STENT;  Surgeon: Bjorn Pippin, MD;  Location: [facility] ORS;  Service: Urology;  Laterality: Left;   CYSTOSCOPY WITH RETROGRADE PYELOGRAM, URETEROSCOPY AND STENT PLACEMENT Left 08/11/2016   Procedure: CYSTOSCOPY WITH RETROGRADE PYELOGRAM,  DIAGNOSTIC URETEROSCOPY , STENT EXCHANGE;  Surgeon: Sebastian Ache, MD;  Location: [facility];  Service: Urology;  Laterality: Left;   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   severe fibromatosis palm and fingers   LEFT HEART CATHETERIZATION WITH CORONARY ANGIOGRAM N/A 01/08/2014   Procedure: LEFT HEART CATHETERIZATION WITH CORONARY ANGIOGRAM;  Surgeon: Lennette Bihari, MD;  Location: [facility] CATH LAB;   Service: Cardiovascular;  Laterality:N/A;  total/ subtotal RCA/  mLAD 60-70% w/ mid systolic bridging/  preserved global LVF, ef 55%   MOHS SURGERY  x2  feb and may 2017   left ear /  left leg  (S[facility])   ORIF ACETABULAR FRACTURE Right 03/29/2023   Procedure: OPEN REDUCTION INTERNAL FIXATION (ORIF) ACETABULAR FRACTURE;  Surgeon: Myrene Galas, MD;  Location: [facility] OR;  Service: Orthopedics;  Laterality: Right;   PERCUTANEOUS CORONARY STENT INTERVENTION (PCI-S)  01/08/2014   Procedure: PERCUTANEOUS CORONARY STENT INTERVENTION (PCI-S);  Surgeon: Lennette Bihari, MD;  Location: [facility] CATH LAB;  Service: Cardiovascular;;  DES to mid and distal RCA   POSTERIOR LUMBAR FUSION  10/01/2003   and Laminectomy/ diskectomy  L4 -- S1   ROBOT ASSISTED PYELOPLASTY Left 09/15/2016   Procedure: XI ROBOTIC ASSISTED PYELOPLASTY;  Surgeon: Sebastian Ache, MD;  Location: [facility];  Service: Urology;  Laterality: Left;   TONSILLECTOMY     TRANSTHORACIC ECHOCARDIOGRAM  02/23/2016   mild LVH,  ef 50-55%,  grade 1 diastolic dysfunction/  mild to moderate AV  calcification w/ no stenosis or regurg./  trivial MR and TR/  mild PR    Allergies  Allergen Reactions   Methotrexate Other (See Comments)    REACTION: tachycardia   Aricept [Donepezil Hcl] Other (See Comments)    Nightmares. Pt takes this medication per [facility]    Crestor [Rosuvastatin Calcium] Other (See Comments)    Myalgias with 20mg  dose   Lisinopril Other (See Comments)    cough   Namenda [Memantine] Diarrhea and Nausea Only   Atorvastatin Other (See Comments)    Muscle pain, leg cramps   Depakote [Divalproex Sodium] Rash    Allergy not listed on MAR    Pravastatin Sodium Other (See Comments)    REACTION: cramps, fatigue    Outpatient Encounter Medications as of 01/06/2024  Medication Sig   acetaminophen (TYLENOL) 325 MG tablet Take 2 tablets (650 mg total) by mouth in the morning and at bedtime. (Patient taking differently: Take 650 mg by mouth in the morning and  at bedtime. PRN order: take 650 mg by mouth three times daily as needed for pain)   acetaminophen (TYLENOL) 650 MG suppository Place 650 mg rectally every 6 (six) hours as needed for mild pain (pain score 1-3) or moderate pain (pain score 4-6).   apixaban (ELIQUIS) 5 MG TABS tablet Take 1 tablet (5 mg total) by mouth 2 (two) times daily.   Brexpiprazole (REXULTI) 3 MG TABS Take 3 mg by mouth at bedtime.   camphor-menthol (SARNA) lotion Apply 1 Application topically 2 (two) times daily as needed for itching.   docusate sodium (COLACE) 100 MG capsule Take 1 capsule (100 mg total) by mouth 2 (two) times daily.   Emollient (CETAPHIL) cream Apply topically daily at 6 (six) AM. (Patient taking differently: Apply 1 Application topically at bedtime.)   escitalopram (LEXAPRO) 20 MG tablet Take 20 mg by mouth daily.   Evolocumab (REPATHA SURECLICK) 140 MG/ML SOAJ INJECT 1 PEN INTO SKIN EVERY 14 DAYS   ezetimibe (ZETIA) 10 MG tablet Take 5 mg by mouth daily.   haloperidol (HALDOL) 1 MG tablet Take 1 mg by mouth daily as needed for agitation.   hydrOXYzine (VISTARIL) 25 MG capsule Take 25 mg by mouth in the morning and at bedtime.   LORazepam (ATIVAN) 0.5 MG tablet Take 1 tablet (0.5 mg total) by mouth 2 (two) times daily.   melatonin 5 MG TABS Take 5 mg by mouth at bedtime.   methocarbamol (ROBAXIN) 500 MG tablet Take 1 tablet (500 mg total) by mouth every 6 (six) hours as needed for muscle spasms.   metoprolol tartrate (LOPRESSOR) 25 MG tablet Take 25 mg by mouth 2 (two) times daily. Hold for SBP <100   Multiple Vitamin (MULTIVITAMIN WITH MINERALS) TABS tablet Take 1 tablet by mouth daily.   nitroGLYCERIN (NITROSTAT) 0.4 MG SL tablet Place 0.4 mg under the tongue every 5 (five) minutes as needed for chest pain.   omeprazole (PRILOSEC) 40 MG capsule Take 40 mg by mouth in the morning. Take on empty stomach 30 minutes before breakfast.   rosuvastatin (CRESTOR) 5 MG tablet Take 5 mg by mouth every morning.    triamcinolone cream (KENALOG) 0.1 % Apply 1 Application topically daily as needed (rash / itching).   [DISCONTINUED] apixaban (ELIQUIS) 2.5 MG TABS tablet Take 1 tablet (2.5 mg total) by mouth 2 (two) times daily.   No facility-administered encounter medications on file as of 01/06/2024.    Review of Systems  Unable to perform ROS: Dementia  Immunization History  Administered Date(s) Administered   Fluad Quad(high Dose 65+) 07/09/2019, 08/15/2020, 09/16/2021, 07/29/2022, 09/06/2023   Influenza Split 08/10/2011, 08/15/2012   Influenza Whole 08/21/2009, 09/22/2010   Influenza, High Dose Seasonal PF 09/16/2015, 08/18/2016, 08/31/2017, 08/14/2018   Influenza,inj,Quad PF,6+ Mos 08/06/2013   Influenza-Unspecified 08/31/2017   Moderna Covid-19 Vaccine Bivalent Booster 90yrs & up 09/06/2023   PFIZER(Purple Top)SARS-COV-2 Vaccination 12/11/2019, 01/01/2020, 08/12/2020   Pfizer Covid-19 Vaccine Bivalent Booster 31yrs & up 03/11/2023   Pneumococcal Conjugate-13 10/02/2013   Pneumococcal Polysaccharide-23 06/04/2010   Tdap 10/22/2011, 09/22/2022   Zoster Recombinant(Shingrix) 03/09/2017, 05/10/2017   Pertinent  Health Maintenance Due  Topic Date Due   INFLUENZA VACCINE  Completed      11/30/2022    9:57 AM 05/03/2023    4:08 PM 08/15/2023   10:15 AM 08/18/2023    4:26 PM 11/01/2023   12:07 PM  Fall Risk  Falls in the past year? 1 1 0 1 0  Was there an injury with Fall? 1 0 0 1 0  Fall Risk Category Calculator 2 2 0 3 0  Patient at Risk for Falls Due to No Fall Risks  No Fall Risks History of fall(s)   Fall risk Follow up Falls evaluation completed Falls evaluation completed Falls evaluation completed Falls evaluation completed Falls evaluation completed   Functional Status Survey:    Vitals:   01/06/24 1107  BP: (!) 152/90  Pulse: 60  Resp: 14  Temp: 98.1 F (36.7 C)  SpO2: 96%   There is no height or weight on file to calculate BMI. Physical Exam Vitals and nursing note  reviewed.  Constitutional:      General: He is not in acute distress.    Appearance: He is not diaphoretic.     Comments: Sleepy but easily arouses  HENT:     Head:     Comments: Left eye brown laceration with sutures. No drainage, redness or swelling.     Nose: Nose normal.  Eyes:     Conjunctiva/sclera: Conjunctivae normal.     Pupils: Pupils are equal, round, and reactive to light.  Neck:     Thyroid: No thyromegaly.     Vascular: No JVD.     Trachea: No tracheal deviation.  Cardiovascular:     Rate and Rhythm: Normal rate and regular rhythm.     Heart sounds: No murmur heard. Pulmonary:     Effort: Pulmonary effort is normal. No respiratory distress.     Breath sounds: Wheezing present.  Abdominal:     General: Bowel sounds are normal. There is no distension.     Palpations: Abdomen is soft.     Tenderness: There is no abdominal tenderness.  Musculoskeletal:     Cervical back: Normal range of motion and neck supple.     Right lower leg: No edema.     Left lower leg: No edema.  Lymphadenopathy:     Cervical: No cervical adenopathy.  Skin:    General: Skin is warm and dry.  Neurological:     General: No focal deficit present.     Mental Status: Mental status is at baseline.     Cranial Nerves: No cranial nerve deficit.     Labs reviewed: Recent Labs    03/30/23 0633 03/31/23 0219 04/05/23 0000 01/01/24 0004 01/02/24 0557 01/05/24 1805  NA 137 135   < > 138 135 139  K 4.0 3.5   < > 3.9 3.4* 3.6  CL 104 102   < >  107 105 107  CO2 25 23   < > 23 20* 24  GLUCOSE 148* 112*   < > 104* 112* 96  BUN 16 20   < > 20 21 15   CREATININE 0.88 0.77   < > 0.89 0.76 0.88  CALCIUM 8.2* 8.3*   < > 8.5* 8.4* 8.5*  MG 2.0 2.1  --   --  1.9  --    < > = values in this interval not displayed.   Recent Labs    06/15/23 2048 07/19/23 0000 01/01/24 0004 01/02/24 0557  AST 17 18 24  35  ALT 16 11 29  33  ALKPHOS 67 81 55 51  BILITOT 0.3  --  0.5 0.5  PROT 6.4*  --  6.6 6.6   ALBUMIN 3.1* 3.6 3.2* 3.0*   Recent Labs    04/16/23 1002 05/09/23 0000 01/01/24 0004 01/02/24 0557 01/05/24 1805  WBC 8.0   < > 6.1 4.2 4.1  NEUTROABS 5.6  --  5.0  --  2.0  HGB 10.9*   < > 11.0* 10.7* 11.7*  HCT 34.3*   < > 32.8* 32.2* 34.6*  MCV 98.0   < > 91.9 92.8 90.8  PLT 466*   < > 227 196 229   < > = values in this interval not displayed.   Lab Results  Component Value Date   TSH 0.79 01/26/2023   Lab Results  Component Value Date   HGBA1C 6.4 08/30/2017   Lab Results  Component Value Date   CHOL 126 01/26/2023   HDL 36 01/26/2023   LDLCALC 39 01/26/2023   LDLDIRECT 168.9 05/24/2013   TRIG 254 (A) 01/26/2023   CHOLHDL 4.2 08/04/2020    Significant Diagnostic Results in last 30 days:  CT Head Wo Contrast Result Date: 01/05/2024 CLINICAL DATA:  Head trauma, minor (Age >= 65y); Neck trauma (Age >= 65y) EXAM: CT HEAD WITHOUT CONTRAST CT CERVICAL SPINE WITHOUT CONTRAST TECHNIQUE: Multidetector CT imaging of the head and cervical spine was performed following the standard protocol without intravenous contrast. Multiplanar CT image reconstructions of the cervical spine were also generated. RADIATION DOSE REDUCTION: This exam was performed according to the departmental dose-optimization program which includes automated exposure control, adjustment of the mA and/or kV according to patient size and/or use of iterative reconstruction technique. COMPARISON:  CT head and CT cervical spine April 16, 2023. FINDINGS: CT HEAD FINDINGS Brain: No evidence of acute infarction, hemorrhage, hydrocephalus, extra-axial collection or mass lesion/mass effect. Similar white matter hypodensities, compatible chronic microvascular ischemic disease. Similar cerebral atrophy. Vascular: No hyperdense vessel identified. Calcific atherosclerosis. Skull: No acute fracture. Sinuses/Orbits: Opacified posterior right ethmoid air cell. Otherwise, sinuses are mostly clear. No acute orbital findings. Other: No  mastoid effusions. CT CERVICAL SPINE FINDINGS Alignment: No substantial sagittal subluxation. Alignment is similar to the prior. Skull base and vertebrae: No evidence of acute fracture. Vertebral body heights are unchanged. C4-C6 ACDF with solid bony fusion at these levels. Soft tissues and spinal canal: No prevertebral fluid or swelling. No visible canal hematoma. Disc levels: Similar multilevel degenerative change including facet uncovertebral hypertrophy that contributes to varying degrees of neural foraminal stenosis. Upper chest: Visualized lung apices are clear. IMPRESSION: 1. No evidence of acute abnormality intracranially or in the cervical spine. 2. Similar chronic findings, detailed above. Electronically Signed   By: Feliberto Harts M.D.   On: 01/05/2024 18:25   CT Cervical Spine Wo Contrast Result Date: 01/05/2024 CLINICAL DATA:  Head trauma, minor (Age >=  65y); Neck trauma (Age >= 65y) EXAM: CT HEAD WITHOUT CONTRAST CT CERVICAL SPINE WITHOUT CONTRAST TECHNIQUE: Multidetector CT imaging of the head and cervical spine was performed following the standard protocol without intravenous contrast. Multiplanar CT image reconstructions of the cervical spine were also generated. RADIATION DOSE REDUCTION: This exam was performed according to the departmental dose-optimization program which includes automated exposure control, adjustment of the mA and/or kV according to patient size and/or use of iterative reconstruction technique. COMPARISON:  CT head and CT cervical spine April 16, 2023. FINDINGS: CT HEAD FINDINGS Brain: No evidence of acute infarction, hemorrhage, hydrocephalus, extra-axial collection or mass lesion/mass effect. Similar white matter hypodensities, compatible chronic microvascular ischemic disease. Similar cerebral atrophy. Vascular: No hyperdense vessel identified. Calcific atherosclerosis. Skull: No acute fracture. Sinuses/Orbits: Opacified posterior right ethmoid air cell. Otherwise, sinuses  are mostly clear. No acute orbital findings. Other: No mastoid effusions. CT CERVICAL SPINE FINDINGS Alignment: No substantial sagittal subluxation. Alignment is similar to the prior. Skull base and vertebrae: No evidence of acute fracture. Vertebral body heights are unchanged. C4-C6 ACDF with solid bony fusion at these levels. Soft tissues and spinal canal: No prevertebral fluid or swelling. No visible canal hematoma. Disc levels: Similar multilevel degenerative change including facet uncovertebral hypertrophy that contributes to varying degrees of neural foraminal stenosis. Upper chest: Visualized lung apices are clear. IMPRESSION: 1. No evidence of acute abnormality intracranially or in the cervical spine. 2. Similar chronic findings, detailed above. Electronically Signed   By: Feliberto Harts M.D.   On: 01/05/2024 18:25   DG Chest Port 1 View Result Date: 01/01/2024 CLINICAL DATA:  Questionable sepsis - evaluate for abnormality EXAM: PORTABLE CHEST 1 VIEW COMPARISON:  Chest x-ray 06/15/2023 FINDINGS: The heart and mediastinal contours are unchanged. Atherosclerotic plaque. No focal consolidation. No pulmonary edema. No pleural effusion. No pneumothorax. No acute osseous abnormality.  Old healed left rib fractures. IMPRESSION: 1. No active disease. 2.  Aortic Atherosclerosis (ICD10-I70.0). Electronically Signed   By: Tish Frederickson M.D.   On: 01/01/2024 00:37    Assessment/Plan  Fall with Laceration Unwitnessed fall on 01/05/2024 leading to laceration on left eyebrow, sutured in ED. CT head and neck showed no acute abnormalities. -Monitor laceration site. Sutures are absorbable and will not require removal. Tdap up to date  Influenza A Recent hospitalization (12/31/2023-01/02/2024) for fever, hypoxia, and borderline low blood pressure. Diagnosed with Influenza A and started on Tamiflu and Xopenex breathing treatments. Currently on droplet precautions. -Continue isolation until 01/08/2024 or earlier  if onset of first symptom can be determined.  Atrial Fibrillation On Eliquis for anticoagulation. Recent fall raises concern for fall risk. -Continue Eliquis. Readdress anticoagulation strategy with cardiology if fall pattern continues.  Sleep Apnea Not compliant with CPAP due to advancing dementia. Recent episode of cyanosis and apnea resolved. -Monitor vital signs. No further intervention due to non-compliance with CPAP.  Dementia with Behaviors Managed by Dr. Edd Fabian. On scheduled Ativan, Lexapro, PRN Haldol, and Rexulti. Behaviors are generally well controlled. -Continue current management plan.  Family/ staff Communication: nurse  Labs/tests ordered:  BMP pending

## 2024-01-16 ENCOUNTER — Non-Acute Institutional Stay (SKILLED_NURSING_FACILITY): Payer: Self-pay | Admitting: Adult Health

## 2024-01-16 DIAGNOSIS — G308 Other Alzheimer's disease: Secondary | ICD-10-CM

## 2024-01-16 DIAGNOSIS — I10 Essential (primary) hypertension: Secondary | ICD-10-CM

## 2024-01-16 DIAGNOSIS — I2510 Atherosclerotic heart disease of native coronary artery without angina pectoris: Secondary | ICD-10-CM | POA: Diagnosis not present

## 2024-01-16 DIAGNOSIS — I480 Paroxysmal atrial fibrillation: Secondary | ICD-10-CM | POA: Diagnosis not present

## 2024-01-16 DIAGNOSIS — F02C0 Dementia in other diseases classified elsewhere, severe, without behavioral disturbance, psychotic disturbance, mood disturbance, and anxiety: Secondary | ICD-10-CM

## 2024-01-16 DIAGNOSIS — E782 Mixed hyperlipidemia: Secondary | ICD-10-CM

## 2024-01-16 DIAGNOSIS — M069 Rheumatoid arthritis, unspecified: Secondary | ICD-10-CM | POA: Diagnosis not present
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
## 2024-01-18 ENCOUNTER — Encounter: Payer: Self-pay | Admitting: Adult Health

## 2024-01-18 MED ORDER — CETAPHIL MOISTURIZING EX CREA: 1.0000 | TOPICAL_CREAM | Freq: Every day | CUTANEOUS | Status: AC

## 2024-01-25 ENCOUNTER — Telehealth: Payer: Self-pay | Admitting: Orthopedic Surgery
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
## 2024-01-26 ENCOUNTER — Ambulatory Visit: Payer: Self-pay | Admitting: Internal Medicine

## 2024-01-26 ENCOUNTER — Encounter: Payer: Self-pay | Admitting: Adult Health

## 2024-01-26 ENCOUNTER — Non-Acute Institutional Stay (SKILLED_NURSING_FACILITY): Admitting: Adult Health

## 2024-01-26 DIAGNOSIS — F02C3 Dementia in other diseases classified elsewhere, severe, with mood disturbance: Secondary | ICD-10-CM | POA: Diagnosis not present

## 2024-01-26 DIAGNOSIS — G308 Other Alzheimer's disease: Secondary | ICD-10-CM

## 2024-01-26 MED ORDER — LORAZEPAM 2 MG/ML IJ SOLN
0.5000 mg | Freq: Every day | INTRAMUSCULAR | 0 refills | Status: DC | PRN
Start: 2024-01-26 — End: 2024-01-30

## 2024-01-26 NOTE — Telephone Encounter (Signed)
 Nurse from [facility] where J.B. lives needs IM stat order for Haldol. Connected nurse with on call provider.   Answer Assessment - Initial Assessment Questions 1. REASON FOR CALL or QUESTION: "What is your reason for calling today?" or "How can I best help you?" or "What question do you have that I can help answer?"     [facility] looking for On Call information to get order for IM order. Connected nurse to [facility] (On call provider) 2. CALLER: Document the source of call. (e.g., laboratory, patient).     Patient  Protocols used: PCP Call - No Triage-A-AH

## 2024-01-26 NOTE — Progress Notes (Signed)
 Location:  Medical illustrator of Service:  SNF (31) Provider:   Peggye Ley, ANP [facility] [phone]   [name], MD  Patient Care Team: [name], MD as PCP - General (Internal Medicine) Quintella Reichert, MD as PCP - Cardiology (Cardiology) Hillis Range, MD (Inactive) as PCP - Electrophysiology (Cardiology) Hilarie Fredrickson, MD (Gastroenterology) Hillis Range, MD (Inactive) (Cardiology) Donnetta Hail, MD as Consulting Physician (Rheumatology) Berneice Heinrich Delbert Phenix., MD as Consulting Physician (Urology) Antony Contras, MD as Consulting Physician (Ophthalmology) Dannielle Burn (Dentistry) Eileen Stanford, MD as Referring Physician (Allergy and Immunology) Van Clines, MD as Consulting Physician (Neurology) Kathyrn Sheriff, [facility] (Inactive) as Pharmacist (Pharmacist) Van Clines, MD as Consulting Physician (Neurology)  Extended Emergency Contact Information Primary Emergency Contact: Lafferty,[name] Address: [address]          [place], [place] Home Phone: [phone] Work Phone: [phone] Mobile Phone: [phone] Relation: Spouse Secondary Emergency Contact: [name],[name] Home Phone: [phone] Mobile Phone: [phone] Relation: Daughter  Code Status:  DNR Goals of care: Advanced Directive information    01/16/2024   10:28 AM  Advanced Directives  Does Patient Have a Medical Advance Directive? Yes  Type of Advance Directive Out of facility DNR (pink MOST or yellow form)  Does patient want to make changes to medical advance directive? No - Patient declined  Pre-existing out of facility DNR order (yellow form or pink MOST form) Pink MOST/Yellow Form most recent copy in chart - Physician notified to receive inpatient order     Chief Complaint  Patient presents with   Acute Visit    Agitation     HPI:  Pt is a 89 y.o. male seen today for agitation  Mr. Telford moved to  skilled care from the memory care unit. He has a hx of severe dementia with agitation and is followed by psychiatry. His behaviors were fairly well controlled until the move although he did still have periods of outburst. The change in environment has precipitated an increase in agitation. He is physically and verbally abusive to the nurses. Last evening this occurred. He refused oral meds to help calm him down. The oncall provider ordered IM ativan and this resulted in a calming mood.  Past Medical History:  Diagnosis Date   CAD (coronary artery disease)    a. s/p inferior STEMI 01/08/2014; LHC 01/08/14: total RCA occlusion s/p 3.5x54mm Xience DES distal RCA and 3.5x28 mm DES mid RCA, 60-70% mid LAD stenosis, EF 55%.   Complication of anesthesia    'long to wake up after back surgery " 09/2003   Diverticulosis of colon    GERD (gastroesophageal reflux disease)    History of cellulitis    05-26-2015  LLE   History of colon polyps    1998- benign/  2008 adenomatous    History of kidney stones    2013   History of squamous cell carcinoma excision    2013;  2015;  06-12-2015 right leg/  02/ and 05/ 2017  left ear and left leg   Hydronephrosis, left    Hyperlipidemia    Hypertension    Migraine with aura    OSA on CPAP 06/19/2015   Moderate OSA with AHI 18/hr  per study 05-20-2015   Osteoarthritis    Paroxysmal atrial fibrillation (HCC) 4/16   chads2vasc score is at least 4   Premature atrial contractions    RA (rheumatoid arthritis) (  [facility])    rheumatologist-  dr Alben Deeds   Sinusitis, chronic 10/17/2015   Wears glasses    Wears hearing aid    bilateral   Past Surgical History:  Procedure Laterality Date   BACK SURGERY     CARDIOVASCULAR STRESS TEST  11/21/2015   Low risk nuclear study w/ a small diaphragmatic attenuation artifact, no ischemia/  normal LV function and wall motion , ef 63%   CATARACT EXTRACTION W/ INTRAOCULAR LENS  IMPLANT, BILATERAL  2015   COLONOSCOPY  last one  06-09-2011   CORONARY ANGIOPLASTY     CYSTOSCOPY W/ RETROGRADES Left 07/12/2016   Procedure: CYSTOSCOPY WITH RETROGRADE PYELOGRAM LEFT URETERAL STENT;  Surgeon: Bjorn Pippin, MD;  Location: [facility];  Service: Urology;  Laterality: Left;   CYSTOSCOPY WITH RETROGRADE PYELOGRAM, URETEROSCOPY AND STENT PLACEMENT Left 08/11/2016   Procedure: CYSTOSCOPY WITH RETROGRADE PYELOGRAM,  DIAGNOSTIC URETEROSCOPY , STENT EXCHANGE;  Surgeon: Sebastian Ache, MD;  Location: [facility];  Service: Urology;  Laterality: Left;   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   severe fibromatosis palm and fingers   LEFT HEART CATHETERIZATION WITH CORONARY ANGIOGRAM N/A 01/08/2014   Procedure: LEFT HEART CATHETERIZATION WITH CORONARY ANGIOGRAM;  Surgeon: Lennette Bihari, MD;  Location: [facility] CATH LAB;  Service: Cardiovascular;  Laterality:N/A;  total/ subtotal RCA/  mLAD 60-70% w/ mid systolic bridging/  preserved global LVF, ef 55%   MOHS SURGERY  x2  feb and may 2017   left ear /  left leg  ([facility])   ORIF ACETABULAR FRACTURE Right 03/29/2023   Procedure: OPEN REDUCTION INTERNAL FIXATION (ORIF) ACETABULAR FRACTURE;  Surgeon: Myrene Galas, MD;  Location: [facility] OR;  Service: Orthopedics;  Laterality: Right;   PERCUTANEOUS CORONARY STENT INTERVENTION (PCI-S)  01/08/2014   Procedure: PERCUTANEOUS CORONARY STENT INTERVENTION (PCI-S);  Surgeon: Lennette Bihari, MD;  Location: [facility] CATH LAB;  Service: Cardiovascular;;  DES to mid and distal RCA   POSTERIOR LUMBAR FUSION  10/01/2003   and Laminectomy/ diskectomy  L4 -- S1   ROBOT ASSISTED PYELOPLASTY Left 09/15/2016   Procedure: XI ROBOTIC ASSISTED PYELOPLASTY;  Surgeon: Sebastian Ache, MD;  Location: [facility];  Service: Urology;  Laterality: Left;   TONSILLECTOMY     TRANSTHORACIC ECHOCARDIOGRAM  02/23/2016   mild LVH,  ef 50-55%,  grade 1 diastolic dysfunction/  mild to moderate AV calcification w/ no stenosis or regurg./  trivial MR and TR/  mild PR    Allergies  Allergen  Reactions   Methotrexate Other (See Comments)    REACTION: tachycardia   Aricept [Donepezil Hcl] Other (See Comments)    Nightmares. Pt takes this medication per [facility]    Crestor [Rosuvastatin Calcium] Other (See Comments)    Myalgias with 20mg  dose   Lisinopril Other (See Comments)    cough   Namenda [Memantine] Diarrhea and Nausea Only   Atorvastatin Other (See Comments)    Muscle pain, leg cramps   Depakote [Divalproex Sodium] Rash    Allergy not listed on MAR    Pravastatin Sodium Other (See Comments)    REACTION: cramps, fatigue    Outpatient Encounter Medications as of 01/26/2024  Medication Sig   LORazepam (ATIVAN) 2 MG/ML injection Inject 0.25 mLs (0.5 mg total) into the muscle daily as needed for up to 14 days.   acetaminophen (TYLENOL) 325 MG tablet Take 2 tablets (650 mg total) by mouth in the morning and at bedtime.   apixaban (ELIQUIS) 5 MG TABS tablet Take 1 tablet (5 mg  total) by mouth 2 (two) times daily.   Brexpiprazole (REXULTI) 3 MG TABS Take 3 mg by mouth at bedtime.   camphor-menthol (SARNA) lotion Apply 1 Application topically 2 (two) times daily as needed for itching.   docusate sodium (COLACE) 100 MG capsule Take 1 capsule (100 mg total) by mouth 2 (two) times daily.   Emollient (CETAPHIL) cream Apply 1 Application topically at bedtime.   escitalopram (LEXAPRO) 20 MG tablet Take 20 mg by mouth daily.   Evolocumab (REPATHA SURECLICK) 140 MG/ML SOAJ INJECT 1 PEN INTO SKIN EVERY 14 DAYS   ezetimibe (ZETIA) 10 MG tablet Take 5 mg by mouth daily.   haloperidol (HALDOL) 1 MG tablet Take 1 mg by mouth daily as needed for agitation.   hydrOXYzine (VISTARIL) 25 MG capsule Take 25 mg by mouth in the morning and at bedtime.   LORazepam (ATIVAN) 0.5 MG tablet Take 0.5 mg by mouth at bedtime.   melatonin 5 MG TABS Take 5 mg by mouth at bedtime.   methocarbamol (ROBAXIN) 500 MG tablet Take 1 tablet (500 mg total) by mouth every 6 (six) hours as needed for muscle spasms.    metoprolol tartrate (LOPRESSOR) 25 MG tablet Take 25 mg by mouth 2 (two) times daily. Hold for SBP <100   Multiple Vitamin (MULTIVITAMIN WITH MINERALS) TABS tablet Take 1 tablet by mouth daily.   nitroGLYCERIN (NITROSTAT) 0.4 MG SL tablet Place 0.4 mg under the tongue every 5 (five) minutes as needed for chest pain.   omeprazole (PRILOSEC) 40 MG capsule Take 40 mg by mouth in the morning. Take on empty stomach 30 minutes before breakfast.   rosuvastatin (CRESTOR) 5 MG tablet Take 5 mg by mouth every morning.   triamcinolone cream (KENALOG) 0.1 % Apply 1 Application topically daily as needed (rash / itching).   [DISCONTINUED] apixaban (ELIQUIS) 2.5 MG TABS tablet Take 1 tablet (2.5 mg total) by mouth 2 (two) times daily.   No facility-administered encounter medications on file as of 01/26/2024.    Review of Systems  Unable to perform ROS: Dementia    Immunization History  Administered Date(s) Administered   Fluad Quad(high Dose 65+) 07/09/2019, 08/15/2020, 09/16/2021, 07/29/2022, 09/06/2023   Influenza Split 08/10/2011, 08/15/2012   Influenza Whole 08/21/2009, 09/22/2010   Influenza, High Dose Seasonal PF 09/16/2015, 08/18/2016, 08/31/2017, 08/14/2018   Influenza,inj,Quad PF,6+ Mos 08/06/2013   Influenza-Unspecified 08/31/2017   Moderna Covid-19 Vaccine Bivalent Booster 67yrs & up 09/06/2023   PFIZER(Purple Top)SARS-COV-2 Vaccination 12/11/2019, 01/01/2020, 08/12/2020   Pfizer Covid-19 Vaccine Bivalent Booster 53yrs & up 03/11/2023   Pneumococcal Conjugate-13 10/02/2013   Pneumococcal Polysaccharide-23 06/04/2010   Tdap 10/22/2011, 09/22/2022   Zoster Recombinant(Shingrix) 03/09/2017, 05/10/2017   Pertinent  Health Maintenance Due  Topic Date Due   INFLUENZA VACCINE  Completed      11/30/2022    9:57 AM 05/03/2023    4:08 PM 08/15/2023   10:15 AM 08/18/2023    4:26 PM 11/01/2023   12:07 PM  Fall Risk  Falls in the past year? 1 1 0 1 0  Was there an injury with Fall? 1 0 0 1 0   Fall Risk Category Calculator 2 2 0 3 0  Patient at Risk for Falls Due to No Fall Risks  No Fall Risks History of fall(s)   Fall risk Follow up Falls evaluation completed Falls evaluation completed Falls evaluation completed Falls evaluation completed Falls evaluation completed   Functional Status Survey:    Vitals:   01/26/24 1620  BP:  120/82  Pulse: (!) 58  Resp: 18  Temp: 97.6 F (36.4 C)  SpO2: 97%   There is no height or weight on file to calculate BMI. Physical Exam Vitals and nursing note reviewed.  Constitutional:      Appearance: Normal appearance.  HENT:     Head: Normocephalic and atraumatic.  Eyes:     Conjunctiva/sclera: Conjunctivae normal.     Pupils: Pupils are equal, round, and reactive to light.  Cardiovascular:     Rate and Rhythm: Normal rate and regular rhythm.     Heart sounds: No murmur heard. Pulmonary:     Effort: Pulmonary effort is normal. No respiratory distress.     Breath sounds: Normal breath sounds. No wheezing.  Abdominal:     General: Bowel sounds are normal. There is no distension.     Palpations: Abdomen is soft.     Tenderness: There is no abdominal tenderness.  Musculoskeletal:     Cervical back: No rigidity.     Right lower leg: No edema.     Left lower leg: No edema.  Lymphadenopathy:     Cervical: No cervical adenopathy.  Skin:    General: Skin is warm and dry.  Neurological:     General: No focal deficit present.     Mental Status: He is alert. Mental status is at baseline.  Psychiatric:        Mood and Affect: Mood normal.     Labs reviewed: Recent Labs    03/30/23 0633 03/31/23 0219 04/05/23 0000 01/01/24 0004 01/02/24 0557 01/05/24 1805 01/06/24 0000  NA 137 135   < > 138 135 139 141  K 4.0 3.5   < > 3.9 3.4* 3.6 3.5  CL 104 102   < > 107 105 107 107  CO2 25 23   < > 23 20* 24 21  GLUCOSE 148* 112*   < > 104* 112* 96  --   BUN 16 20   < > 20 21 15 13   CREATININE 0.88 0.77   < > 0.89 0.76 0.88 0.7   CALCIUM 8.2* 8.3*   < > 8.5* 8.4* 8.5* 8.5*  MG 2.0 2.1  --   --  1.9  --   --    < > = values in this interval not displayed.   Recent Labs    06/15/23 2048 07/19/23 0000 01/01/24 0004 01/02/24 0557  AST 17 18 24  35  ALT 16 11 29  33  ALKPHOS 67 81 55 51  BILITOT 0.3  --  0.5 0.5  PROT 6.4*  --  6.6 6.6  ALBUMIN 3.1* 3.6 3.2* 3.0*   Recent Labs    04/16/23 1002 05/09/23 0000 01/01/24 0004 01/02/24 0557 01/05/24 1805  WBC 8.0   < > 6.1 4.2 4.1  NEUTROABS 5.6  --  5.0  --  2.0  HGB 10.9*   < > 11.0* 10.7* 11.7*  HCT 34.3*   < > 32.8* 32.2* 34.6*  MCV 98.0   < > 91.9 92.8 90.8  PLT 466*   < > 227 196 229   < > = values in this interval not displayed.   Lab Results  Component Value Date   TSH 0.79 01/26/2023   Lab Results  Component Value Date   HGBA1C 6.4 08/30/2017   Lab Results  Component Value Date   CHOL 126 01/26/2023   HDL 36 01/26/2023   LDLCALC 39 01/26/2023   LDLDIRECT 168.9 05/24/2013   TRIG  254 (A) 01/26/2023   CHOLHDL 4.2 08/04/2020    Significant Diagnostic Results in last 30 days:  CT Head Wo Contrast Result Date: 01/05/2024 CLINICAL DATA:  Head trauma, minor (Age >= 65y); Neck trauma (Age >= 65y) EXAM: CT HEAD WITHOUT CONTRAST CT CERVICAL SPINE WITHOUT CONTRAST TECHNIQUE: Multidetector CT imaging of the head and cervical spine was performed following the standard protocol without intravenous contrast. Multiplanar CT image reconstructions of the cervical spine were also generated. RADIATION DOSE REDUCTION: This exam was performed according to the departmental dose-optimization program which includes automated exposure control, adjustment of the mA and/or kV according to patient size and/or use of iterative reconstruction technique. COMPARISON:  CT head and CT cervical spine April 16, 2023. FINDINGS: CT HEAD FINDINGS Brain: No evidence of acute infarction, hemorrhage, hydrocephalus, extra-axial collection or mass lesion/mass effect. Similar white matter  hypodensities, compatible chronic microvascular ischemic disease. Similar cerebral atrophy. Vascular: No hyperdense vessel identified. Calcific atherosclerosis. Skull: No acute fracture. Sinuses/Orbits: Opacified posterior right ethmoid air cell. Otherwise, sinuses are mostly clear. No acute orbital findings. Other: No mastoid effusions. CT CERVICAL SPINE FINDINGS Alignment: No substantial sagittal subluxation. Alignment is similar to the prior. Skull base and vertebrae: No evidence of acute fracture. Vertebral body heights are unchanged. C4-C6 ACDF with solid bony fusion at these levels. Soft tissues and spinal canal: No prevertebral fluid or swelling. No visible canal hematoma. Disc levels: Similar multilevel degenerative change including facet uncovertebral hypertrophy that contributes to varying degrees of neural foraminal stenosis. Upper chest: Visualized lung apices are clear. IMPRESSION: 1. No evidence of acute abnormality intracranially or in the cervical spine. 2. Similar chronic findings, detailed above. Electronically Signed   By: Feliberto Harts M.D.   On: 01/05/2024 18:25   CT Cervical Spine Wo Contrast Result Date: 01/05/2024 CLINICAL DATA:  Head trauma, minor (Age >= 65y); Neck trauma (Age >= 65y) EXAM: CT HEAD WITHOUT CONTRAST CT CERVICAL SPINE WITHOUT CONTRAST TECHNIQUE: Multidetector CT imaging of the head and cervical spine was performed following the standard protocol without intravenous contrast. Multiplanar CT image reconstructions of the cervical spine were also generated. RADIATION DOSE REDUCTION: This exam was performed according to the departmental dose-optimization program which includes automated exposure control, adjustment of the mA and/or kV according to patient size and/or use of iterative reconstruction technique. COMPARISON:  CT head and CT cervical spine April 16, 2023. FINDINGS: CT HEAD FINDINGS Brain: No evidence of acute infarction, hemorrhage, hydrocephalus, extra-axial  collection or mass lesion/mass effect. Similar white matter hypodensities, compatible chronic microvascular ischemic disease. Similar cerebral atrophy. Vascular: No hyperdense vessel identified. Calcific atherosclerosis. Skull: No acute fracture. Sinuses/Orbits: Opacified posterior right ethmoid air cell. Otherwise, sinuses are mostly clear. No acute orbital findings. Other: No mastoid effusions. CT CERVICAL SPINE FINDINGS Alignment: No substantial sagittal subluxation. Alignment is similar to the prior. Skull base and vertebrae: No evidence of acute fracture. Vertebral body heights are unchanged. C4-C6 ACDF with solid bony fusion at these levels. Soft tissues and spinal canal: No prevertebral fluid or swelling. No visible canal hematoma. Disc levels: Similar multilevel degenerative change including facet uncovertebral hypertrophy that contributes to varying degrees of neural foraminal stenosis. Upper chest: Visualized lung apices are clear. IMPRESSION: 1. No evidence of acute abnormality intracranially or in the cervical spine. 2. Similar chronic findings, detailed above. Electronically Signed   By: Feliberto Harts M.D.   On: 01/05/2024 18:25   DG Chest Port 1 View Result Date: 01/01/2024 CLINICAL DATA:  Questionable sepsis - evaluate for abnormality EXAM:  PORTABLE CHEST 1 VIEW COMPARISON:  Chest x-ray 06/15/2023 FINDINGS: The heart and mediastinal contours are unchanged. Atherosclerotic plaque. No focal consolidation. No pulmonary edema. No pleural effusion. No pneumothorax. No acute osseous abnormality.  Old healed left rib fractures. IMPRESSION: 1. No active disease. 2.  Aortic Atherosclerosis (ICD10-I70.0). Electronically Signed   By: Tish Frederickson M.D.   On: 01/01/2024 00:37    Assessment/Plan 1. 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  Dispense: 1 mL; Refill: 0    Family/ staff Communication: nurse  Labs/tests ordered:  Na

## 2024-01-30 ENCOUNTER — Other Ambulatory Visit: Payer: Self-pay | Admitting: Adult Health

## 2024-01-30 ENCOUNTER — Encounter: Payer: Self-pay | Admitting: Internal Medicine

## 2024-01-30 ENCOUNTER — Non-Acute Institutional Stay (SKILLED_NURSING_FACILITY): Payer: Self-pay | Admitting: Internal Medicine

## 2024-01-30 DIAGNOSIS — E782 Mixed hyperlipidemia: Secondary | ICD-10-CM

## 2024-01-30 DIAGNOSIS — G308 Other Alzheimer's disease: Secondary | ICD-10-CM | POA: Diagnosis not present

## 2024-01-30 DIAGNOSIS — I480 Paroxysmal atrial fibrillation: Secondary | ICD-10-CM | POA: Diagnosis not present

## 2024-01-30 DIAGNOSIS — F02C3 Dementia in other diseases classified elsewhere, severe, with mood disturbance: Secondary | ICD-10-CM

## 2024-01-30 DIAGNOSIS — I10 Essential (primary) hypertension: Secondary | ICD-10-CM | POA: Diagnosis not present

## 2024-01-30 DIAGNOSIS — M069 Rheumatoid arthritis, unspecified: Secondary | ICD-10-CM | POA: Diagnosis not present

## 2024-01-30 DIAGNOSIS — I2510 Atherosclerotic heart disease of native coronary artery without angina pectoris: Secondary | ICD-10-CM | POA: Diagnosis not present

## 2024-01-30 DIAGNOSIS — R531 Weakness: Secondary | ICD-10-CM | POA: Diagnosis not present

## 2024-01-30 DIAGNOSIS — R278 Other lack of coordination: Secondary | ICD-10-CM | POA: Diagnosis not present

## 2024-01-30 DIAGNOSIS — F02B3 Dementia in other diseases classified elsewhere, moderate, with mood disturbance: Secondary | ICD-10-CM | POA: Diagnosis not present

## 2024-01-30 DIAGNOSIS — F3341 Major depressive disorder, recurrent, in partial remission: Secondary | ICD-10-CM

## 2024-01-30 MED ORDER — LORAZEPAM 2 MG/ML IJ SOLN: 0.5000 mg | Freq: Every day | INTRAMUSCULAR | 0 refills | Status: DC | PRN
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
## 2024-01-31 DIAGNOSIS — R278 Other lack of coordination: Secondary | ICD-10-CM | POA: Diagnosis not present

## 2024-01-31 DIAGNOSIS — R531 Weakness: Secondary | ICD-10-CM | POA: Diagnosis not present

## 2024-01-31 DIAGNOSIS — F02B3 Dementia in other diseases classified elsewhere, moderate, with mood disturbance: Secondary | ICD-10-CM | POA: Diagnosis not present

## 2024-02-02 DIAGNOSIS — F02B3 Dementia in other diseases classified elsewhere, moderate, with mood disturbance: Secondary | ICD-10-CM | POA: Diagnosis not present

## 2024-02-02 DIAGNOSIS — R278 Other lack of coordination: Secondary | ICD-10-CM | POA: Diagnosis not present

## 2024-02-02 DIAGNOSIS — R531 Weakness: Secondary | ICD-10-CM | POA: Diagnosis not present

## 2024-02-03 DIAGNOSIS — R278 Other lack of coordination: Secondary | ICD-10-CM | POA: Diagnosis not present

## 2024-02-03 DIAGNOSIS — F02B3 Dementia in other diseases classified elsewhere, moderate, with mood disturbance: Secondary | ICD-10-CM | POA: Diagnosis not present

## 2024-02-03 DIAGNOSIS — R531 Weakness: Secondary | ICD-10-CM | POA: Diagnosis not present

## 2024-02-07 DIAGNOSIS — R278 Other lack of coordination: Secondary | ICD-10-CM | POA: Diagnosis not present

## 2024-02-07 DIAGNOSIS — F02B3 Dementia in other diseases classified elsewhere, moderate, with mood disturbance: Secondary | ICD-10-CM | POA: Diagnosis not present

## 2024-02-07 DIAGNOSIS — R531 Weakness: Secondary | ICD-10-CM | POA: Diagnosis not present

## 2024-02-08 DIAGNOSIS — R531 Weakness: Secondary | ICD-10-CM | POA: Diagnosis not present

## 2024-02-08 DIAGNOSIS — F02B3 Dementia in other diseases classified elsewhere, moderate, with mood disturbance: Secondary | ICD-10-CM | POA: Diagnosis not present

## 2024-02-08 DIAGNOSIS — R278 Other lack of coordination: Secondary | ICD-10-CM | POA: Diagnosis not present

## 2024-02-10 DIAGNOSIS — R278 Other lack of coordination: Secondary | ICD-10-CM | POA: Diagnosis not present

## 2024-02-10 DIAGNOSIS — R531 Weakness: Secondary | ICD-10-CM | POA: Diagnosis not present

## 2024-02-10 DIAGNOSIS — F02B3 Dementia in other diseases classified elsewhere, moderate, with mood disturbance: Secondary | ICD-10-CM | POA: Diagnosis not present

## 2024-02-14 DIAGNOSIS — R531 Weakness: Secondary | ICD-10-CM | POA: Diagnosis not present

## 2024-02-14 DIAGNOSIS — R278 Other lack of coordination: Secondary | ICD-10-CM | POA: Diagnosis not present

## 2024-02-14 DIAGNOSIS — F02B3 Dementia in other diseases classified elsewhere, moderate, with mood disturbance: Secondary | ICD-10-CM | POA: Diagnosis not present

## 2024-02-16 DIAGNOSIS — R278 Other lack of coordination: Secondary | ICD-10-CM | POA: Diagnosis not present

## 2024-02-16 DIAGNOSIS — R531 Weakness: Secondary | ICD-10-CM | POA: Diagnosis not present

## 2024-02-16 DIAGNOSIS — F02B3 Dementia in other diseases classified elsewhere, moderate, with mood disturbance: Secondary | ICD-10-CM | POA: Diagnosis not present

## 2024-02-17 DIAGNOSIS — R531 Weakness: Secondary | ICD-10-CM | POA: Diagnosis not present

## 2024-02-17 DIAGNOSIS — R278 Other lack of coordination: Secondary | ICD-10-CM | POA: Diagnosis not present

## 2024-02-17 DIAGNOSIS — F02B3 Dementia in other diseases classified elsewhere, moderate, with mood disturbance: Secondary | ICD-10-CM | POA: Diagnosis not present

## 2024-02-21 ENCOUNTER — Telehealth: Payer: Self-pay | Admitting: Orthopedic Surgery

## 2024-02-21 ENCOUNTER — Encounter (HOSPITAL_COMMUNITY): Payer: Self-pay

## 2024-02-21 ENCOUNTER — Emergency Department (HOSPITAL_COMMUNITY)
Admission: EM | Admit: 2024-02-21 | Discharge: 2024-02-21 | Disposition: A | Discharge status: Home / Self Care | Attending: Emergency Medicine | Admitting: Emergency Medicine

## 2024-02-21 ENCOUNTER — Other Ambulatory Visit: Payer: Self-pay

## 2024-02-21 DIAGNOSIS — I2510 Atherosclerotic heart disease of native coronary artery without angina pectoris: Secondary | ICD-10-CM | POA: Diagnosis not present

## 2024-02-21 DIAGNOSIS — Z7901 Long term (current) use of anticoagulants: Secondary | ICD-10-CM | POA: Insufficient documentation

## 2024-02-21 DIAGNOSIS — R278 Other lack of coordination: Secondary | ICD-10-CM | POA: Diagnosis not present

## 2024-02-21 DIAGNOSIS — I10 Essential (primary) hypertension: Secondary | ICD-10-CM | POA: Insufficient documentation

## 2024-02-21 DIAGNOSIS — R531 Weakness: Secondary | ICD-10-CM | POA: Diagnosis not present

## 2024-02-21 DIAGNOSIS — R456 Violent behavior: Secondary | ICD-10-CM | POA: Insufficient documentation

## 2024-02-21 DIAGNOSIS — F02B3 Dementia in other diseases classified elsewhere, moderate, with mood disturbance: Secondary | ICD-10-CM | POA: Diagnosis not present

## 2024-02-21 DIAGNOSIS — Z743 Need for continuous supervision: Secondary | ICD-10-CM | POA: Diagnosis not present

## 2024-02-21 DIAGNOSIS — R4689 Other symptoms and signs involving appearance and behavior: Secondary | ICD-10-CM

## 2024-02-21 LAB — URINALYSIS, W/ REFLEX TO CULTURE (INFECTION SUSPECTED)
Bilirubin Urine: NEGATIVE
Glucose, UA: NEGATIVE mg/dL
Hgb urine dipstick: NEGATIVE
Ketones, ur: NEGATIVE mg/dL
Leukocytes,Ua: NEGATIVE
Nitrite: NEGATIVE
Protein, ur: NEGATIVE mg/dL
Specific Gravity, Urine: 1.019 (ref 1.005–1.030)
pH: 6 (ref 5.0–8.0)

## 2024-02-21 LAB — COMPREHENSIVE METABOLIC PANEL WITH GFR
ALT: 31 U/L (ref 0–44)
AST: 28 U/L (ref 15–41)
Albumin: 3.5 g/dL (ref 3.5–5.0)
Alkaline Phosphatase: 60 U/L (ref 38–126)
Anion gap: 8 (ref 5–15)
BUN: 16 mg/dL (ref 8–23)
CO2: 24 mmol/L (ref 22–32)
Calcium: 8.7 mg/dL — ABNORMAL LOW (ref 8.9–10.3)
Chloride: 106 mmol/L (ref 98–111)
Creatinine, Ser: 0.89 mg/dL (ref 0.61–1.24)
GFR, Estimated: >60 mL/min (ref >60)
Glucose, Bld: 81 mg/dL (ref 70–99)
Potassium: 3.6 mmol/L (ref 3.5–5.1)
Sodium: 138 mmol/L (ref 135–145)
Total Bilirubin: 0.6 mg/dL (ref 0.0–1.2)
Total Protein: 6.7 g/dL (ref 6.5–8.1)

## 2024-02-21 LAB — CBC WITH DIFFERENTIAL/PLATELET
Abs Immature Granulocytes: 0.01 10*3/uL (ref 0.00–0.07)
Basophils Absolute: 0.1 10*3/uL (ref 0.0–0.1)
Basophils Relative: 1 %
Eosinophils Absolute: 0.4 10*3/uL (ref 0.0–0.5)
Eosinophils Relative: 5 %
HCT: 36.3 % — ABNORMAL LOW (ref 39.0–52.0)
Hemoglobin: 12 g/dL — ABNORMAL LOW (ref 13.0–17.0)
Immature Granulocytes: 0 %
Lymphocytes Relative: 30 %
Lymphs Abs: 2.4 10*3/uL (ref 0.7–4.0)
MCH: 30.9 pg (ref 26.0–34.0)
MCHC: 33.1 g/dL (ref 30.0–36.0)
MCV: 93.6 fL (ref 80.0–100.0)
Monocytes Absolute: 0.6 10*3/uL (ref 0.1–1.0)
Monocytes Relative: 7 %
Neutro Abs: 4.4 10*3/uL (ref 1.7–7.7)
Neutrophils Relative %: 57 %
Platelets: 283 10*3/uL (ref 150–400)
RBC: 3.88 MIL/uL — ABNORMAL LOW (ref 4.22–5.81)
RDW: 14.5 % (ref 11.5–15.5)
WBC: 7.7 10*3/uL (ref 4.0–10.5)
nRBC: 0 % (ref 0.0–0.2)
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
## 2024-02-21 NOTE — Discharge Instructions (Signed)
 It was a pleasure caring for you today.  Your workup today was reassuring.  Please follow-up with your primary care provider.  Seek emergency care if experiencing any new or worsening symptoms.
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
## 2024-02-21 NOTE — ED Provider Notes (Signed)
 [place] EMERGENCY DEPARTMENT AT [facility] Provider Note   CSN: [id number] Arrival date & time: 02/21/24  1401     History  Chief Complaint  Patient presents with   Aggressive Behavior    MADOC Todd is a 89 y.o. male with PMHx OSA, paroxysmal afib, GERD, HLD, HTN, migraines, RA, CAD, alzheimer's dementia residing at [facility] who presents to ED concerned for agitation. Patient denying any complaints or recent infectious processes, so most of hx was obtained from [employer]. Patient currently calm and resting in ED.  RN Mystique: patient with hx of aggressive behavior. Patient given 1mg  Haldol IM which did not relieve symptoms. Patient then given a second dose of 1mg  Haldol IM and was sent to ED for further evaluation. Nurse denies any recent infectious symptoms. Patient sent because it usually only takes on dose of Haldol to calm him down. Patient did calm down after the second dose of Haldol.  HPI     Home Medications Prior to Admission medications   Medication Sig Start Date End Date Taking? Authorizing Provider  acetaminophen (TYLENOL) 325 MG tablet Take 2 tablets (650 mg total) by mouth in the morning and at bedtime. 04/04/23   Fletcher Anon, NP  apixaban (ELIQUIS) 5 MG TABS tablet Take 1 tablet (5 mg total) by mouth 2 (two) times daily. 11/30/22   [name], MD  Brexpiprazole (REXULTI) 3 MG TABS Take 3 mg by mouth at bedtime.    [provider]  camphor-menthol Wynelle Fanny) lotion Apply 1 Application topically 2 (two) times daily as needed for itching.    [provider]  docusate sodium (COLACE) 100 MG capsule Take 1 capsule (100 mg total) by mouth 2 (two) times daily. 03/31/23   Rolly Salter, MD  Emollient (CETAPHIL) cream Apply 1 Application topically at bedtime. 01/18/24   Fletcher Anon, NP  escitalopram (LEXAPRO) 20 MG tablet Take 20 mg by mouth daily.    [provider]  Evolocumab (REPATHA SURECLICK) 140 MG/ML  SOAJ INJECT 1 PEN INTO SKIN EVERY 14 DAYS 03/30/21   Quintella Reichert, MD  ezetimibe (ZETIA) 10 MG tablet Take 5 mg by mouth daily.    [provider]  haloperidol (HALDOL) 1 MG tablet Take 1 mg by mouth daily as needed for agitation.    [provider]  hydrOXYzine (VISTARIL) 25 MG capsule Take 25 mg by mouth in the morning and at bedtime.    [provider]  LORazepam (ATIVAN) 0.5 MG tablet Take 0.5 mg by mouth at bedtime.    [provider]  LORazepam (ATIVAN) 2 MG/ML injection Inject 0.25 mLs (0.5 mg total) into the muscle daily as needed for up to 14 doses. 01/30/24   Fletcher Anon, NP  melatonin 5 MG TABS Take 5 mg by mouth at bedtime.    [provider]  methocarbamol (ROBAXIN) 500 MG tablet Take 1 tablet (500 mg total) by mouth every 6 (six) hours as needed for muscle spasms. 03/31/23   Rolly Salter, MD  metoprolol tartrate (LOPRESSOR) 25 MG tablet Take 25 mg by mouth 2 (two) times daily. Hold for SBP <100    [provider]  mirtazapine (REMERON SOL-TAB) 15 MG disintegrating tablet Take 15 mg by mouth at bedtime.    [provider]  Multiple Vitamin (MULTIVITAMIN WITH MINERALS) TABS tablet Take 1 tablet by mouth daily. 04/01/23   Rolly Salter, MD  nitroGLYCERIN (NITROSTAT) 0.4 MG SL tablet Place 0.4  mg under the tongue every 5 (five) minutes as needed for chest pain.    [provider]  omeprazole (PRILOSEC) 40 MG capsule Take 40 mg by mouth in the morning. Take on empty stomach 30 minutes before breakfast.    [provider]  rosuvastatin (CRESTOR) 5 MG tablet Take 5 mg by mouth every morning.    [provider]  triamcinolone cream (KENALOG) 0.1 % Apply 1 Application topically daily as needed (rash / itching).    [provider]  apixaban (ELIQUIS) 2.5 MG TABS tablet Take 1 tablet (2.5 mg total) by mouth 2 (two) times daily. 10/18/22   Fletcher Anon, NP      Allergies    Methotrexate,  Aricept Palma Holter hcl], Crestor [rosuvastatin calcium], Lisinopril, Namenda [memantine], Atorvastatin, Depakote [divalproex sodium], and Pravastatin sodium    Review of Systems   Review of Systems  Psychiatric/Behavioral:  Positive for agitation.     Physical Exam Updated Vital Signs BP 121/69 (BP Location: Right Arm)   Pulse (!) 58   Temp 97.6 F (36.4 C) (Oral)   Resp 16   Ht 6' (1.829 m)   Wt 78 kg   SpO2 99%   BMI 23.33 kg/m  Physical Exam Vitals and nursing note reviewed.  Constitutional:      General: He is not in acute distress.    Appearance: He is not ill-appearing or toxic-appearing.  HENT:     Head: Normocephalic and atraumatic.     Mouth/Throat:     Mouth: Mucous membranes are moist.  Eyes:     General: No scleral icterus.       Right eye: No discharge.        Left eye: No discharge.     Conjunctiva/sclera: Conjunctivae normal.  Cardiovascular:     Rate and Rhythm: Normal rate.     Pulses: Normal pulses.     Heart sounds: No murmur heard. Pulmonary:     Effort: Pulmonary effort is normal. No respiratory distress.     Breath sounds: No wheezing, rhonchi or rales.  Abdominal:     Tenderness: There is no abdominal tenderness.  Musculoskeletal:     Right lower leg: No edema.     Left lower leg: No edema.     Comments: No tenderness of head, neck, shoulder, elbows, wrists, hips, knees, ankles bilaterally.    Skin:    General: Skin is warm and dry.     Findings: No rash.  Neurological:     General: No focal deficit present.     Mental Status: He is alert. Mental status is at baseline.     Comments: Pleasantly demented at baseline.  Psychiatric:        Mood and Affect: Mood normal.     ED Results / Procedures / Treatments   Labs (all labs ordered are listed, but only abnormal results are displayed) Labs Reviewed  URINALYSIS, W/ REFLEX TO CULTURE (INFECTION SUSPECTED) - Abnormal; Notable for the following components:      Result Value   Bacteria,  UA RARE (*)    All other components within normal limits  CBC WITH DIFFERENTIAL/PLATELET - Abnormal; Notable for the following components:   RBC 3.88 (*)    Hemoglobin 12.0 (*)    HCT 36.3 (*)    All other components within normal limits  COMPREHENSIVE METABOLIC PANEL WITH GFR - Abnormal; Notable for the following components:   Calcium 8.7 (*)    All other components within normal limits  EKG None  Radiology No results found.  Procedures Procedures    Medications Ordered in ED Medications - No data to display  ED Course/ Medical Decision Making/ A&P                                 Medical Decision Making Amount and/or Complexity of Data Reviewed Labs: ordered.   This patient presents to the ED for concern of AMS, this involves an extensive number of treatment options, and is a complaint that carries with it a high risk of complications and morbidity.  The differential diagnosis includes CVA, ICH, intracranial mass, critical dehydration, heptatic dysfunction, uremia, hypercarbia, intoxication/withdrawal, endocrine abnormality, sepsis/infection.   Co morbidities that complicate the patient evaluation  OSA, paroxysmal afib, GERD, HLD, HTN, migraines, RA, CAD, alzheimer's dementia   Additional history obtained:  Dr. Chales Abrahams PCP   Problem List / ED Course / Critical interventions / Medication management  Patient presented for agressive behavior. Patient with hx of this which usually resolves with one dose of Haldol, but patient required a repeat dose before he became calm, so he was sent to ED for further workup. Nurse denying any other recent concerns for the patient. Patient is pleasantly demented here in ED and also declines any problems today. Physical exam reassuring. Patient afebrile with stable vitals. I Ordered, and personally interpreted labs.  CBC with mild anemia at patient's baseline with hemoglobin of 12.0.  UA not concerning for infection.  CMP reassuring.   I personally viewed and interpreted the EKG/cardiac monitored which showed an underlying rhythm of: Sinus rhythm. It appears that patient's aggressive behavior was d/t his dementia.  I have reviewed the patients home medicines and have made adjustments as needed Staffed with Dr. Andria Meuse who agrees with plan. Patient was given return precautions. Patient stable for discharge at this time. Patient verbalized understanding of plan.   Social Determinants of Health:  Dementia - SNF resident           Final Clinical Impression(s) / ED Diagnoses Final diagnoses:  Aggressive behavior    Rx / DC Orders ED Discharge Orders     None         Dorthy Cooler, [place] 02/21/24 1719    Anders Simmonds T, DO 02/21/24 2346

## 2024-02-22 DIAGNOSIS — F4323 Adjustment disorder with mixed anxiety and depressed mood: Secondary | ICD-10-CM | POA: Diagnosis not present

## 2024-02-23 ENCOUNTER — Non-Acute Institutional Stay (SKILLED_NURSING_FACILITY): Payer: Self-pay | Admitting: Adult Health

## 2024-02-23 ENCOUNTER — Encounter: Payer: Self-pay | Admitting: Adult Health

## 2024-02-23 DIAGNOSIS — F02C3 Dementia in other diseases classified elsewhere, severe, with mood disturbance: Secondary | ICD-10-CM | POA: Diagnosis not present

## 2024-02-23 DIAGNOSIS — G308 Other Alzheimer's disease: Secondary | ICD-10-CM | POA: Diagnosis not present

## 2024-02-23 DIAGNOSIS — R278 Other lack of coordination: Secondary | ICD-10-CM | POA: Diagnosis not present

## 2024-02-23 DIAGNOSIS — R531 Weakness: Secondary | ICD-10-CM | POA: Diagnosis not present

## 2024-02-23 DIAGNOSIS — F02B3 Dementia in other diseases classified elsewhere, moderate, with mood disturbance: Secondary | ICD-10-CM | POA: Diagnosis not present

## 2024-02-23 MED ORDER — HALOPERIDOL LACTATE 5 MG/ML IJ SOLN: 2.5000 mg | Freq: Every day | INTRAMUSCULAR | Status: DC | PRN

## 2024-02-23 NOTE — Progress Notes (Addendum)
 Location:  Medical illustrator of Service:  SNF (31) Provider:   Peggye Ley, ANP [facility] [phone]   [name], MD  Patient Care Team: [name], MD as PCP - General (Internal Medicine) Quintella Reichert, MD as PCP - Cardiology (Cardiology) Hillis Range, MD (Inactive) as PCP - Electrophysiology (Cardiology) Hilarie Fredrickson, MD (Gastroenterology) Hillis Range, MD (Inactive) (Cardiology) Donnetta Hail, MD as Consulting Physician (Rheumatology) Berneice Heinrich Delbert Phenix., MD as Consulting Physician (Urology) Antony Contras, MD as Consulting Physician (Ophthalmology) Dannielle Burn (Dentistry) Eileen Stanford, MD as Referring Physician (Allergy and Immunology) Van Clines, MD as Consulting Physician (Neurology) Kathyrn Sheriff, [facility] (Inactive) as Pharmacist (Pharmacist) Van Clines, MD as Consulting Physician (Neurology)  Extended Emergency Contact Information Primary Emergency Contact: Lagoy,[name] Address: [address]          [place], [place] Home Phone: [phone] Work Phone: [phone] Mobile Phone: [phone] Relation: Spouse Secondary Emergency Contact: [name],[name] Home Phone: [phone] Mobile Phone: [phone] Relation: Daughter  Code Status:  DNR Goals of care: Advanced Directive information    02/21/2024    2:11 PM  Advanced Directives  Does Patient Have a Medical Advance Directive? Yes  Type of Advance Directive Out of facility DNR (pink MOST or yellow form)     Chief Complaint  Patient presents with   Acute Visit    F/u ER visit agitation     HPI:  The patient is an 89 year old with severe Alzheimer's dementia who presents with aggressive behavior.  He has been exhibiting aggressive behavior since moving to the skilled care unit at [facility] from the memory care unit in March. The transition has been challenging, and he has had  difficulty adjusting, leading to episodes of hitting, kicking, and appearing threatening.  Due to concerns for safety, he was sent to the emergency department where he received two doses of 1 mg Haldol IM without significant response. He had been on oral Haldol and Ativan prior to this visit. The emergency department conducted a UA, CBC, BMP, and EKG, all of which were unremarkable.  He is currently on a medication regimen that includes Lexapro, Remeron (started in March), Ativan, hydroxyzine, Haldol, and Rexulti. Since receiving PRN medication, he has been a little sleepy for the rest of the week.  He resides in the skilled care unit at [facility], where he moved in March from the memory care unit. Past Medical History:  Diagnosis Date   CAD (coronary artery disease)    a. s/p inferior STEMI 01/08/2014; LHC 01/08/14: total RCA occlusion s/p 3.5x37mm Xience DES distal RCA and 3.5x28 mm DES mid RCA, 60-70% mid LAD stenosis, EF 55%.   Complication of anesthesia    'long to wake up after back surgery " 09/2003   Diverticulosis of colon    GERD (gastroesophageal reflux disease)    History of cellulitis    05-26-2015  LLE   History of colon polyps    1998- benign/  2008 adenomatous    History of kidney stones    2013   History of squamous cell carcinoma excision    2013;  2015;  06-12-2015 right leg/  02/ and 05/ 2017  left ear and left leg   Hydronephrosis, left    Hyperlipidemia    Hypertension    Migraine with aura    OSA on CPAP 06/19/2015   Moderate OSA with AHI 18/hr  per study 05-20-2015   Osteoarthritis    Paroxysmal atrial fibrillation (HCC) 4/16   chads2vasc score is at least 4   Premature atrial contractions    RA (rheumatoid arthritis) [facility])    rheumatologist-  dr Alben Deeds   Sinusitis, chronic 10/17/2015   Wears glasses    Wears hearing aid    bilateral   Past Surgical History:  Procedure Laterality Date   BACK SURGERY     CARDIOVASCULAR STRESS  TEST  11/21/2015   Low risk nuclear study w/ a small diaphragmatic attenuation artifact, no ischemia/  normal LV function and wall motion , ef 63%   CATARACT EXTRACTION W/ INTRAOCULAR LENS  IMPLANT, BILATERAL  2015   COLONOSCOPY  last one 06-09-2011   CORONARY ANGIOPLASTY     CYSTOSCOPY W/ RETROGRADES Left 07/12/2016   Procedure: CYSTOSCOPY WITH RETROGRADE PYELOGRAM LEFT URETERAL STENT;  Surgeon: Bjorn Pippin, MD;  Location: [facility];  Service: Urology;  Laterality: Left;   CYSTOSCOPY WITH RETROGRADE PYELOGRAM, URETEROSCOPY AND STENT PLACEMENT Left 08/11/2016   Procedure: CYSTOSCOPY WITH RETROGRADE PYELOGRAM,  DIAGNOSTIC URETEROSCOPY , STENT EXCHANGE;  Surgeon: Sebastian Ache, MD;  Location: [facility];  Service: Urology;  Laterality: Left;   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   severe fibromatosis palm and fingers   LEFT HEART CATHETERIZATION WITH CORONARY ANGIOGRAM N/A 01/08/2014   Procedure: LEFT HEART CATHETERIZATION WITH CORONARY ANGIOGRAM;  Surgeon: Lennette Bihari, MD;  Location: [facility] CATH LAB;  Service: Cardiovascular;  Laterality:N/A;  total/ subtotal RCA/  mLAD 60-70% w/ mid systolic bridging/  preserved global LVF, ef 55%   MOHS SURGERY  x2  feb and may 2017   left ear /  left leg  ([facility])   ORIF ACETABULAR FRACTURE Right 03/29/2023   Procedure: OPEN REDUCTION INTERNAL FIXATION (ORIF) ACETABULAR FRACTURE;  Surgeon: Myrene Galas, MD;  Location: [facility];  Service: Orthopedics;  Laterality: Right;   PERCUTANEOUS CORONARY STENT INTERVENTION (PCI-S)  01/08/2014   Procedure: PERCUTANEOUS CORONARY STENT INTERVENTION (PCI-S);  Surgeon: Lennette Bihari, MD;  Location: [facility] CATH LAB;  Service: Cardiovascular;;  DES to mid and distal RCA   POSTERIOR LUMBAR FUSION  10/01/2003   and Laminectomy/ diskectomy  L4 -- S1   ROBOT ASSISTED PYELOPLASTY Left 09/15/2016   Procedure: XI ROBOTIC ASSISTED PYELOPLASTY;  Surgeon: Sebastian Ache, MD;  Location: [facility];  Service: Urology;  Laterality:  Left;   TONSILLECTOMY     TRANSTHORACIC ECHOCARDIOGRAM  02/23/2016   mild LVH,  ef 50-55%,  grade 1 diastolic dysfunction/  mild to moderate AV calcification w/ no stenosis or regurg./  trivial MR and TR/  mild PR    Allergies  Allergen Reactions   Methotrexate Other (See Comments)    REACTION: tachycardia   Aricept [Donepezil Hcl] Other (See Comments)    Nightmares. Pt takes this medication per [facility]    Crestor [Rosuvastatin Calcium] Other (See Comments)    Myalgias with 20mg  dose   Lisinopril Other (See Comments)    cough   Namenda [Memantine] Diarrhea and Nausea Only   Atorvastatin Other (See Comments)    Muscle pain, leg cramps   Depakote [Divalproex Sodium] Rash    Allergy not listed on MAR    Pravastatin Sodium Other (See Comments)    REACTION: cramps, fatigue    Outpatient Encounter Medications as of 02/23/2024  Medication Sig   acetaminophen (TYLENOL) 325 MG tablet Take 2 tablets (650 mg total) by mouth in the morning and at bedtime.   apixaban (ELIQUIS)  5 MG TABS tablet Take 1 tablet (5 mg total) by mouth 2 (two) times daily.   Brexpiprazole (REXULTI) 3 MG TABS Take 3 mg by mouth at bedtime.   camphor-menthol (SARNA) lotion Apply 1 Application topically 2 (two) times daily as needed for itching.   docusate sodium (COLACE) 100 MG capsule Take 1 capsule (100 mg total) by mouth 2 (two) times daily.   Emollient (CETAPHIL) cream Apply 1 Application topically at bedtime.   escitalopram (LEXAPRO) 20 MG tablet Take 20 mg by mouth daily.   Evolocumab (REPATHA SURECLICK) 140 MG/ML SOAJ INJECT 1 PEN INTO SKIN EVERY 14 DAYS   ezetimibe (ZETIA) 10 MG tablet Take 5 mg by mouth daily.   haloperidol (HALDOL) 1 MG tablet Take 1 mg by mouth daily as needed for agitation.   hydrOXYzine (VISTARIL) 25 MG capsule Take 25 mg by mouth in the morning and at bedtime.   LORazepam (ATIVAN) 0.5 MG tablet Take 0.5 mg by mouth at bedtime.   LORazepam (ATIVAN) 2 MG/ML injection Inject 0.25 mLs (0.5 mg  total) into the muscle daily as needed for up to 14 doses.   melatonin 5 MG TABS Take 5 mg by mouth at bedtime.   methocarbamol (ROBAXIN) 500 MG tablet Take 1 tablet (500 mg total) by mouth every 6 (six) hours as needed for muscle spasms.   metoprolol tartrate (LOPRESSOR) 25 MG tablet Take 25 mg by mouth 2 (two) times daily. Hold for SBP <100   mirtazapine (REMERON SOL-TAB) 15 MG disintegrating tablet Take 15 mg by mouth at bedtime.   Multiple Vitamin (MULTIVITAMIN WITH MINERALS) TABS tablet Take 1 tablet by mouth daily.   nitroGLYCERIN (NITROSTAT) 0.4 MG SL tablet Place 0.4 mg under the tongue every 5 (five) minutes as needed for chest pain.   omeprazole (PRILOSEC) 40 MG capsule Take 40 mg by mouth in the morning. Take on empty stomach 30 minutes before breakfast.   rosuvastatin (CRESTOR) 5 MG tablet Take 5 mg by mouth every morning.   triamcinolone cream (KENALOG) 0.1 % Apply 1 Application topically daily as needed (rash / itching).   [DISCONTINUED] apixaban (ELIQUIS) 2.5 MG TABS tablet Take 1 tablet (2.5 mg total) by mouth 2 (two) times daily.   No facility-administered encounter medications on file as of 02/23/2024.    Review of Systems  Immunization History  Administered Date(s) Administered   Fluad Quad(high Dose 65+) 07/09/2019, 08/15/2020, 09/16/2021, 07/29/2022, 09/06/2023   Influenza Split 08/10/2011, 08/15/2012   Influenza Whole 08/21/2009, 09/22/2010   Influenza, High Dose Seasonal PF 09/16/2015, 08/18/2016, 08/31/2017, 08/14/2018   Influenza,inj,Quad PF,6+ Mos 08/06/2013   Influenza-Unspecified 08/31/2017   Moderna Covid-19 Vaccine Bivalent Booster 15yrs & up 09/06/2023   PFIZER(Purple Top)SARS-COV-2 Vaccination 12/11/2019, 01/01/2020, 08/12/2020   Pfizer Covid-19 Vaccine Bivalent Booster 54yrs & up 03/11/2023   Pneumococcal Conjugate-13 10/02/2013   Pneumococcal Polysaccharide-23 06/04/2010   Tdap 10/22/2011, 09/22/2022   Zoster Recombinant(Shingrix) 03/09/2017, 05/10/2017    Pertinent  Health Maintenance Due  Topic Date Due   INFLUENZA VACCINE  06/15/2024      11/30/2022    9:57 AM 05/03/2023    4:08 PM 08/15/2023   10:15 AM 08/18/2023    4:26 PM 11/01/2023   12:07 PM  Fall Risk  Falls in the past year? 1 1 0 1 0  Was there an injury with Fall? 1 0 0 1 0  Fall Risk Category Calculator 2 2 0 3 0  Patient at Risk for Falls Due to No Fall Risks  No  Fall Risks History of fall(s)   Fall risk Follow up Falls evaluation completed Falls evaluation completed Falls evaluation completed Falls evaluation completed Falls evaluation completed   Functional Status Survey:    Vitals:   02/23/24 1558  BP: (!) 154/93  Pulse: 64  Resp: 20  Temp: 97.7 F (36.5 C)  SpO2: 97%   There is no height or weight on file to calculate BMI. Physical Exam Vitals and nursing note reviewed.  Constitutional:      Appearance: Normal appearance.  HENT:     Head: Normocephalic and atraumatic.  Eyes:     Conjunctiva/sclera: Conjunctivae normal.  Cardiovascular:     Rate and Rhythm: Normal rate and regular rhythm.     Heart sounds: No murmur heard. Pulmonary:     Effort: Pulmonary effort is normal. No respiratory distress.     Breath sounds: Normal breath sounds. No wheezing.  Abdominal:     General: Bowel sounds are normal. There is no distension.     Palpations: Abdomen is soft.     Tenderness: There is no abdominal tenderness.  Musculoskeletal:     Cervical back: No rigidity.     Right lower leg: No edema.     Left lower leg: No edema.  Lymphadenopathy:     Cervical: No cervical adenopathy.  Skin:    General: Skin is warm and dry.  Neurological:     General: No focal deficit present.     Mental Status: He is alert. Mental status is at baseline.  Psychiatric:        Mood and Affect: Mood normal.     Labs reviewed: Recent Labs    03/30/23 0633 03/31/23 0219 04/05/23 0000 01/02/24 0557 01/05/24 1805 01/06/24 0000 02/21/24 1452  NA 137 135   < > 135 139  141 138  K 4.0 3.5   < > 3.4* 3.6 3.5 3.6  CL 104 102   < > 105 107 107 106  CO2 25 23   < > 20* 24 21 24   GLUCOSE 148* 112*   < > 112* 96  --  81  BUN 16 20   < > 21 15 13 16   CREATININE 0.88 0.77   < > 0.76 0.88 0.7 0.89  CALCIUM 8.2* 8.3*   < > 8.4* 8.5* 8.5* 8.7*  MG 2.0 2.1  --  1.9  --   --   --    < > = values in this interval not displayed.   Recent Labs    01/01/24 0004 01/02/24 0557 02/21/24 1452  AST 24 35 28  ALT 29 33 31  ALKPHOS 55 51 60  BILITOT 0.5 0.5 0.6  PROT 6.6 6.6 6.7  ALBUMIN 3.2* 3.0* 3.5   Recent Labs    01/01/24 0004 01/02/24 0557 01/05/24 1805 02/21/24 1435  WBC 6.1 4.2 4.1 7.7  NEUTROABS 5.0  --  2.0 4.4  HGB 11.0* 10.7* 11.7* 12.0*  HCT 32.8* 32.2* 34.6* 36.3*  MCV 91.9 92.8 90.8 93.6  PLT 227 196 229 283   Lab Results  Component Value Date   TSH 0.79 01/26/2023   Lab Results  Component Value Date   HGBA1C 6.4 08/30/2017   Lab Results  Component Value Date   CHOL 126 01/26/2023   HDL 36 01/26/2023   LDLCALC 39 01/26/2023   LDLDIRECT 168.9 05/24/2013   TRIG 254 (A) 01/26/2023   CHOLHDL 4.2 08/04/2020    Significant Diagnostic Results in last 30 days:  No  results found.  Assessment/Plan  Severe Alzheimer's dementia with aggressive behavior  Severe Alzheimer's with recent aggression unresponsive to oral and IM Haldol. Requires medication adjustment to prevent future hospitalizations. - Increase PRN Haldol to 2.5 mg IM daily as needed for aggression. - Follow up with Dr. Edd Fabian for further management. - Continue Lexapro, Remeron, Ativan, hydroxyzine, Haldol, and Rexulti.

## 2024-02-24 DIAGNOSIS — R531 Weakness: Secondary | ICD-10-CM | POA: Diagnosis not present

## 2024-02-24 DIAGNOSIS — R278 Other lack of coordination: Secondary | ICD-10-CM | POA: Diagnosis not present

## 2024-02-24 DIAGNOSIS — F02B3 Dementia in other diseases classified elsewhere, moderate, with mood disturbance: Secondary | ICD-10-CM | POA: Diagnosis not present

## 2024-02-27 ENCOUNTER — Non-Acute Institutional Stay (SKILLED_NURSING_FACILITY): Payer: Self-pay | Admitting: Internal Medicine

## 2024-02-27 ENCOUNTER — Encounter: Payer: Self-pay | Admitting: Internal Medicine

## 2024-02-27 DIAGNOSIS — I10 Essential (primary) hypertension: Secondary | ICD-10-CM | POA: Diagnosis not present

## 2024-02-27 DIAGNOSIS — F02C3 Dementia in other diseases classified elsewhere, severe, with mood disturbance: Secondary | ICD-10-CM | POA: Diagnosis not present

## 2024-02-27 DIAGNOSIS — G308 Other Alzheimer's disease: Secondary | ICD-10-CM

## 2024-02-27 DIAGNOSIS — I480 Paroxysmal atrial fibrillation: Secondary | ICD-10-CM

## 2024-02-27 NOTE — Progress Notes (Signed)
 Location:  [facility] Room Number: 124 A Place of Service:  SNF [phone]) Provider:  Marguerite Shiley, MD  Patient Care Team: Marguerite Shiley, MD as PCP - General (Internal Medicine) Jacqueline Matsu, MD as PCP - Cardiology (Cardiology) Jolly Needle, MD (Inactive) as PCP - Electrophysiology (Cardiology) Tobin Forts, MD (Gastroenterology) Jolly Needle, MD (Inactive) (Cardiology) Alanson Alliance, MD as Consulting Physician (Rheumatology) Secundino Dach Harvey Linen., MD as Consulting Physician (Urology) Alvina Axon, MD as Consulting Physician (Ophthalmology) Vernida Goodie (Dentistry) Anselmo Kings, MD as Referring Physician (Allergy and Immunology) Jhonny Moss, MD as Consulting Physician (Neurology) Jonathan Neighbor, [facility] (Inactive) as Pharmacist (Pharmacist) Jhonny Moss, MD as Consulting Physician (Neurology)  Extended Emergency Contact Information Primary Emergency Contact: Orman,[name] Address: [address]          [place], [place] Home Phone: [phone] Work Phone: [phone] Mobile Phone: [phone] Relation: Spouse Secondary Emergency Contact: [name],[name] Home Phone: [phone] Mobile Phone: [phone] Relation: Daughter  Code Status:  DNR Goals of care: Advanced Directive information    02/21/2024    2:11 PM  Advanced Directives  Does Patient Have a Medical Advance Directive? Yes  Type of Advance Directive Out of facility DNR (pink MOST or yellow form)     Chief Complaint  Patient presents with   Acute Visit    HPI:  Pt is a 89 y.o. male seen today for Acute Visit  Patient has h/o Dementia with behavior issues, Rheumatoid Arthritis, A Fib, HTN, CAD s/p PCI, GERD and Sleep apnea And Depression   Lives in SNF in [place]  Recently has been having severe behavioral issues He was sent to ED on 04/08 severe aggressive behavior including hitting kicking cursing.  Patient received 2 doses  of IM Haldol.  He continues to be on p.o. Haldol.  Dr. Vita Grip has started him on Risperdal.  We cannot do Depakote and Namenda due to his allergies .  Today was a day to renew his p.o. Haldol I discussed with the nurses.  They said that in spite of Risperdal  all he still is needing p.o. Haldol and as needed Haldol due to his aggressive behavior. Patient unable to give me much history as he just got extra p.o. Haldol and was little sleepy He did have a workup done in ED which included CT of his head and labs and a UA which were all normal  Past Medical History:  Diagnosis Date   CAD (coronary artery disease)    a. s/p inferior STEMI 01/08/2014; LHC 01/08/14: total RCA occlusion s/p 3.5x19mm Xience DES distal RCA and 3.5x28 mm DES mid RCA, 60-70% mid LAD stenosis, EF 55%.   Complication of anesthesia    'long to wake up after back surgery " 09/2003   Diverticulosis of colon    GERD (gastroesophageal reflux disease)    History of cellulitis    05-26-2015  LLE   History of colon polyps    1998- benign/  2008 adenomatous    History of kidney stones    2013   History of squamous cell carcinoma excision    2013;  2015;  06-12-2015 right leg/  02/ and 05/ 2017  left ear and left leg   Hydronephrosis, left    Hyperlipidemia    Hypertension    Migraine with aura    OSA on CPAP 06/19/2015   Moderate OSA with AHI 18/hr  per study 05-20-2015   Osteoarthritis  Paroxysmal atrial fibrillation (HCC) 4/16   chads2vasc score is at least 4   Premature atrial contractions    RA (rheumatoid arthritis) [facility])    rheumatologist-  dr Alben Deeds   Sinusitis, chronic 10/17/2015   Wears glasses    Wears hearing aid    bilateral   Past Surgical History:  Procedure Laterality Date   BACK SURGERY     CARDIOVASCULAR STRESS TEST  11/21/2015   Low risk nuclear study w/ a small diaphragmatic attenuation artifact, no ischemia/  normal LV function and wall motion , ef 63%   CATARACT EXTRACTION W/  INTRAOCULAR LENS  IMPLANT, BILATERAL  2015   COLONOSCOPY  last one 06-09-2011   CORONARY ANGIOPLASTY     CYSTOSCOPY W/ RETROGRADES Left 07/12/2016   Procedure: CYSTOSCOPY WITH RETROGRADE PYELOGRAM LEFT URETERAL STENT;  Surgeon: Bjorn Pippin, MD;  Location: [facility];  Service: Urology;  Laterality: Left;   CYSTOSCOPY WITH RETROGRADE PYELOGRAM, URETEROSCOPY AND STENT PLACEMENT Left 08/11/2016   Procedure: CYSTOSCOPY WITH RETROGRADE PYELOGRAM,  DIAGNOSTIC URETEROSCOPY , STENT EXCHANGE;  Surgeon: Sebastian Ache, MD;  Location: [facility];  Service: Urology;  Laterality: Left;   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   severe fibromatosis palm and fingers   LEFT HEART CATHETERIZATION WITH CORONARY ANGIOGRAM N/A 01/08/2014   Procedure: LEFT HEART CATHETERIZATION WITH CORONARY ANGIOGRAM;  Surgeon: Lennette Bihari, MD;  Location: [facility] CATH LAB;  Service: Cardiovascular;  Laterality:N/A;  total/ subtotal RCA/  mLAD 60-70% w/ mid systolic bridging/  preserved global LVF, ef 55%   MOHS SURGERY  x2  feb and may 2017   left ear /  left leg  ([facility])   ORIF ACETABULAR FRACTURE Right 03/29/2023   Procedure: OPEN REDUCTION INTERNAL FIXATION (ORIF) ACETABULAR FRACTURE;  Surgeon: Myrene Galas, MD;  Location: [facility];  Service: Orthopedics;  Laterality: Right;   PERCUTANEOUS CORONARY STENT INTERVENTION (PCI-S)  01/08/2014   Procedure: PERCUTANEOUS CORONARY STENT INTERVENTION (PCI-S);  Surgeon: Lennette Bihari, MD;  [facility];  Service: Cardiovascular;;  DES to mid and distal RCA   POSTERIOR LUMBAR FUSION  10/01/2003   and Laminectomy/ diskectomy  L4 -- S1   ROBOT ASSISTED PYELOPLASTY Left 09/15/2016   Procedure: XI ROBOTIC ASSISTED PYELOPLASTY;  Surgeon: Sebastian Ache, MD;  Location: [facility];  Service: Urology;  Laterality: Left;   TONSILLECTOMY     TRANSTHORACIC ECHOCARDIOGRAM  02/23/2016   mild LVH,  ef 50-55%,  grade 1 diastolic dysfunction/  mild to moderate AV calcification w/ no stenosis  or regurg./  trivial MR and TR/  mild PR    Allergies  Allergen Reactions   Methotrexate Other (See Comments)    REACTION: tachycardia   Aricept [Donepezil Hcl] Other (See Comments)    Nightmares. Pt takes this medication per [facility]    Crestor [Rosuvastatin Calcium] Other (See Comments)    Myalgias with 20mg  dose   Lisinopril Other (See Comments)    cough   Namenda [Memantine] Diarrhea and Nausea Only   Atorvastatin Other (See Comments)    Muscle pain, leg cramps   Depakote [Divalproex Sodium] Rash    Allergy not listed on MAR    Pravastatin Sodium Other (See Comments)    REACTION: cramps, fatigue    Outpatient Encounter Medications as of 02/27/2024  Medication Sig   acetaminophen (TYLENOL) 325 MG tablet Take 2 tablets (650 mg total) by mouth in the morning and at bedtime.   apixaban (ELIQUIS) 5 MG TABS tablet Take 1 tablet (5 mg  total) by mouth 2 (two) times daily.   camphor-menthol (SARNA) lotion Apply 1 Application topically 2 (two) times daily as needed for itching.   docusate sodium (COLACE) 100 MG capsule Take 1 capsule (100 mg total) by mouth 2 (two) times daily.   Emollient (CETAPHIL) cream Apply 1 Application topically at bedtime.   escitalopram (LEXAPRO) 20 MG tablet Take 20 mg by mouth daily.   Evolocumab (REPATHA SURECLICK) 140 MG/ML SOAJ INJECT 1 PEN INTO SKIN EVERY 14 DAYS   ezetimibe (ZETIA) 10 MG tablet Take 5 mg by mouth daily.   haloperidol (HALDOL) 1 MG tablet Take 1 mg by mouth daily as needed for agitation.   haloperidol lactate (HALDOL) 5 MG/ML injection Inject 0.5 mLs (2.5 mg total) into the muscle daily as needed.   hydrOXYzine (VISTARIL) 25 MG capsule Take 25 mg by mouth in the morning and at bedtime.   LORazepam (ATIVAN) 0.5 MG tablet Take 0.5 mg by mouth 2 (two) times daily. 1 tab, oral, Twice a day Increase Ativan to 0.5mg  BID   melatonin 5 MG TABS Take 5 mg by mouth at bedtime.   methocarbamol (ROBAXIN) 500 MG tablet Take 1 tablet (500 mg total) by mouth  every 6 (six) hours as needed for muscle spasms.   metoprolol tartrate (LOPRESSOR) 25 MG tablet Take 25 mg by mouth 2 (two) times daily. Hold for SBP <100   mirtazapine (REMERON SOL-TAB) 15 MG disintegrating tablet Take 15 mg by mouth at bedtime.   Multiple Vitamin (MULTIVITAMIN WITH MINERALS) TABS tablet Take 1 tablet by mouth daily.   nitroGLYCERIN (NITROSTAT) 0.4 MG SL tablet Place 0.4 mg under the tongue every 5 (five) minutes as needed for chest pain.   omeprazole (PRILOSEC) 40 MG capsule Take 40 mg by mouth in the morning. Take on empty stomach 30 minutes before breakfast.   risperiDONE (RISPERDAL) 1 MG tablet Take 1 mg by mouth in the morning and at bedtime.   rosuvastatin (CRESTOR) 5 MG tablet Take 5 mg by mouth every morning.   triamcinolone cream (KENALOG) 0.1 % Apply 1 Application topically daily as needed (rash / itching).   Brexpiprazole (REXULTI) 3 MG TABS Take 3 mg by mouth at bedtime. (Patient not taking: Reported on 02/27/2024)   LORazepam (ATIVAN) 2 MG/ML injection Inject 0.25 mLs (0.5 mg total) into the muscle daily as needed for up to 14 doses. (Patient not taking: Reported on 02/27/2024)   [DISCONTINUED] apixaban (ELIQUIS) 2.5 MG TABS tablet Take 1 tablet (2.5 mg total) by mouth 2 (two) times daily.   No facility-administered encounter medications on file as of 02/27/2024.    Review of Systems  Unable to perform ROS: Dementia    Immunization History  Administered Date(s) Administered   Fluad Quad(high Dose 65+) 07/09/2019, 08/15/2020, 09/16/2021, 07/29/2022, 09/06/2023   Influenza Split 08/10/2011, 08/15/2012   Influenza Whole 08/21/2009, 09/22/2010   Influenza, High Dose Seasonal PF 09/16/2015, 08/18/2016, 08/31/2017, 08/14/2018   Influenza,inj,Quad PF,6+ Mos 08/06/2013   Influenza-Unspecified 08/31/2017   Moderna Covid-19 Vaccine Bivalent Booster 36yrs & up 09/06/2023   PFIZER(Purple Top)SARS-COV-2 Vaccination 12/11/2019, 01/01/2020, 08/12/2020   Pfizer Covid-19  Vaccine Bivalent Booster 92yrs & up 03/11/2023   Pneumococcal Conjugate-13 10/02/2013   Pneumococcal Polysaccharide-23 06/04/2010   Tdap 10/22/2011, 09/22/2022   Zoster Recombinant(Shingrix) 03/09/2017, 05/10/2017   Pertinent  Health Maintenance Due  Topic Date Due   INFLUENZA VACCINE  06/15/2024      11/30/2022    9:57 AM 05/03/2023    4:08 PM 08/15/2023  10:15 AM 08/18/2023    4:26 PM 11/01/2023   12:07 PM  Fall Risk  Falls in the past year? 1 1 0 1 0  Was there an injury with Fall? 1 0 0 1 0  Fall Risk Category Calculator 2 2 0 3 0  Patient at Risk for Falls Due to No Fall Risks  No Fall Risks History of fall(s)   Fall risk Follow up Falls evaluation completed Falls evaluation completed Falls evaluation completed Falls evaluation completed Falls evaluation completed   Functional Status Survey:    Vitals:   02/27/24 1452  BP: (!) 150/81  Pulse: 64  Resp: 20  Temp: 97.7 F (36.5 C)  SpO2: 97%  Weight: 178 lb 9.6 oz (81 kg)  Height: 6' (1.829 m)   Body mass index is 24.22 kg/m. Physical Exam Vitals reviewed.  Constitutional:      Appearance: Normal appearance.  HENT:     Head: Normocephalic.     Nose: Nose normal.     Mouth/Throat:     Mouth: Mucous membranes are moist.     Pharynx: Oropharynx is clear.  Eyes:     Pupils: Pupils are equal, round, and reactive to light.  Cardiovascular:     Rate and Rhythm: Normal rate.     Pulses: Normal pulses.  Pulmonary:     Effort: Pulmonary effort is normal.     Breath sounds: Normal breath sounds.  Abdominal:     General: Abdomen is flat. Bowel sounds are normal.     Palpations: Abdomen is soft.  Musculoskeletal:        General: No swelling.     Cervical back: Neck supple.  Skin:    General: Skin is warm.  Neurological:     General: No focal deficit present.     Mental Status: He is alert.  Psychiatric:        Mood and Affect: Mood normal.        Thought Content: Thought content normal.     Labs  reviewed: Recent Labs    03/30/23 0633 03/31/23 0219 04/05/23 0000 01/02/24 0557 01/05/24 1805 01/06/24 0000 02/21/24 1452  NA 137 135   < > 135 139 141 138  K 4.0 3.5   < > 3.4* 3.6 3.5 3.6  CL 104 102   < > 105 107 107 106  CO2 25 23   < > 20* 24 21 24   GLUCOSE 148* 112*   < > 112* 96  --  81  BUN 16 20   < > 21 15 13 16   CREATININE 0.88 0.77   < > 0.76 0.88 0.7 0.89  CALCIUM 8.2* 8.3*   < > 8.4* 8.5* 8.5* 8.7*  MG 2.0 2.1  --  1.9  --   --   --    < > = values in this interval not displayed.   Recent Labs    01/01/24 0004 01/02/24 0557 02/21/24 1452  AST 24 35 28  ALT 29 33 31  ALKPHOS 55 51 60  BILITOT 0.5 0.5 0.6  PROT 6.6 6.6 6.7  ALBUMIN 3.2* 3.0* 3.5   Recent Labs    01/01/24 0004 01/02/24 0557 01/05/24 1805 02/21/24 1435  WBC 6.1 4.2 4.1 7.7  NEUTROABS 5.0  --  2.0 4.4  HGB 11.0* 10.7* 11.7* 12.0*  HCT 32.8* 32.2* 34.6* 36.3*  MCV 91.9 92.8 90.8 93.6  PLT 227 196 229 283   Lab Results  Component Value Date  TSH 0.79 01/26/2023   Lab Results  Component Value Date   HGBA1C 6.4 08/30/2017   Lab Results  Component Value Date   CHOL 126 01/26/2023   HDL 36 01/26/2023   LDLCALC 39 01/26/2023   LDLDIRECT 168.9 05/24/2013   TRIG 254 (A) 01/26/2023   CHOLHDL 4.2 08/04/2020    Significant Diagnostic Results in last 30 days:  No results found.  Assessment/Plan 1. 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 Paroxysmal atrial fibrillation (HCC) On Eliquis   3. Primary hypertension On Lopressor    Family/ staff Communication:   Labs/tests ordered:

## 2024-02-28 DIAGNOSIS — F02B3 Dementia in other diseases classified elsewhere, moderate, with mood disturbance: Secondary | ICD-10-CM | POA: Diagnosis not present

## 2024-02-28 DIAGNOSIS — R531 Weakness: Secondary | ICD-10-CM | POA: Diagnosis not present

## 2024-02-28 DIAGNOSIS — R278 Other lack of coordination: Secondary | ICD-10-CM | POA: Diagnosis not present

## 2024-03-01 DIAGNOSIS — R278 Other lack of coordination: Secondary | ICD-10-CM | POA: Diagnosis not present

## 2024-03-01 DIAGNOSIS — R531 Weakness: Secondary | ICD-10-CM | POA: Diagnosis not present

## 2024-03-01 DIAGNOSIS — F02B3 Dementia in other diseases classified elsewhere, moderate, with mood disturbance: Secondary | ICD-10-CM | POA: Diagnosis not present

## 2024-03-02 DIAGNOSIS — F02B3 Dementia in other diseases classified elsewhere, moderate, with mood disturbance: Secondary | ICD-10-CM | POA: Diagnosis not present

## 2024-03-02 DIAGNOSIS — R531 Weakness: Secondary | ICD-10-CM | POA: Diagnosis not present

## 2024-03-02 DIAGNOSIS — R278 Other lack of coordination: Secondary | ICD-10-CM | POA: Diagnosis not present

## 2024-03-06 DIAGNOSIS — R278 Other lack of coordination: Secondary | ICD-10-CM | POA: Diagnosis not present

## 2024-03-06 DIAGNOSIS — F02B3 Dementia in other diseases classified elsewhere, moderate, with mood disturbance: Secondary | ICD-10-CM | POA: Diagnosis not present

## 2024-03-06 DIAGNOSIS — R531 Weakness: Secondary | ICD-10-CM | POA: Diagnosis not present

## 2024-03-08 DIAGNOSIS — F02B3 Dementia in other diseases classified elsewhere, moderate, with mood disturbance: Secondary | ICD-10-CM | POA: Diagnosis not present

## 2024-03-08 DIAGNOSIS — R278 Other lack of coordination: Secondary | ICD-10-CM | POA: Diagnosis not present

## 2024-03-08 DIAGNOSIS — R531 Weakness: Secondary | ICD-10-CM | POA: Diagnosis not present

## 2024-03-09 DIAGNOSIS — R278 Other lack of coordination: Secondary | ICD-10-CM | POA: Diagnosis not present

## 2024-03-09 DIAGNOSIS — F02B3 Dementia in other diseases classified elsewhere, moderate, with mood disturbance: Secondary | ICD-10-CM | POA: Diagnosis not present

## 2024-03-09 DIAGNOSIS — R531 Weakness: Secondary | ICD-10-CM | POA: Diagnosis not present

## 2024-03-13 DIAGNOSIS — R278 Other lack of coordination: Secondary | ICD-10-CM | POA: Diagnosis not present

## 2024-03-13 DIAGNOSIS — F02B3 Dementia in other diseases classified elsewhere, moderate, with mood disturbance: Secondary | ICD-10-CM | POA: Diagnosis not present

## 2024-03-13 DIAGNOSIS — R531 Weakness: Secondary | ICD-10-CM | POA: Diagnosis not present

## 2024-03-15 DIAGNOSIS — R278 Other lack of coordination: Secondary | ICD-10-CM | POA: Diagnosis not present

## 2024-03-15 DIAGNOSIS — R531 Weakness: Secondary | ICD-10-CM | POA: Diagnosis not present

## 2024-03-15 DIAGNOSIS — F02B3 Dementia in other diseases classified elsewhere, moderate, with mood disturbance: Secondary | ICD-10-CM | POA: Diagnosis not present

## 2024-03-16 DIAGNOSIS — F02B3 Dementia in other diseases classified elsewhere, moderate, with mood disturbance: Secondary | ICD-10-CM | POA: Diagnosis not present

## 2024-03-16 DIAGNOSIS — R531 Weakness: Secondary | ICD-10-CM | POA: Diagnosis not present

## 2024-03-16 DIAGNOSIS — R278 Other lack of coordination: Secondary | ICD-10-CM | POA: Diagnosis not present

## 2024-03-19 DIAGNOSIS — R531 Weakness: Secondary | ICD-10-CM | POA: Diagnosis not present

## 2024-03-19 DIAGNOSIS — R278 Other lack of coordination: Secondary | ICD-10-CM | POA: Diagnosis not present

## 2024-03-19 DIAGNOSIS — F02B3 Dementia in other diseases classified elsewhere, moderate, with mood disturbance: Secondary | ICD-10-CM | POA: Diagnosis not present

## 2024-03-20 DIAGNOSIS — R531 Weakness: Secondary | ICD-10-CM | POA: Diagnosis not present

## 2024-03-20 DIAGNOSIS — F02B3 Dementia in other diseases classified elsewhere, moderate, with mood disturbance: Secondary | ICD-10-CM | POA: Diagnosis not present

## 2024-03-20 DIAGNOSIS — R278 Other lack of coordination: Secondary | ICD-10-CM | POA: Diagnosis not present

## 2024-03-22 DIAGNOSIS — F02B3 Dementia in other diseases classified elsewhere, moderate, with mood disturbance: Secondary | ICD-10-CM | POA: Diagnosis not present

## 2024-03-22 DIAGNOSIS — R278 Other lack of coordination: Secondary | ICD-10-CM | POA: Diagnosis not present

## 2024-03-22 DIAGNOSIS — R531 Weakness: Secondary | ICD-10-CM | POA: Diagnosis not present

## 2024-03-27 DIAGNOSIS — R531 Weakness: Secondary | ICD-10-CM | POA: Diagnosis not present

## 2024-03-27 DIAGNOSIS — R278 Other lack of coordination: Secondary | ICD-10-CM | POA: Diagnosis not present

## 2024-03-27 DIAGNOSIS — F02B3 Dementia in other diseases classified elsewhere, moderate, with mood disturbance: Secondary | ICD-10-CM | POA: Diagnosis not present

## 2024-03-28 DIAGNOSIS — R531 Weakness: Secondary | ICD-10-CM | POA: Diagnosis not present

## 2024-03-28 DIAGNOSIS — R278 Other lack of coordination: Secondary | ICD-10-CM | POA: Diagnosis not present

## 2024-03-28 DIAGNOSIS — F02B3 Dementia in other diseases classified elsewhere, moderate, with mood disturbance: Secondary | ICD-10-CM | POA: Diagnosis not present

## 2024-03-29 ENCOUNTER — Encounter: Payer: Self-pay | Admitting: Adult Health

## 2024-03-29 ENCOUNTER — Non-Acute Institutional Stay (SKILLED_NURSING_FACILITY): Payer: Self-pay | Admitting: Adult Health

## 2024-03-29 DIAGNOSIS — E782 Mixed hyperlipidemia: Secondary | ICD-10-CM | POA: Diagnosis not present

## 2024-03-29 DIAGNOSIS — F4321 Adjustment disorder with depressed mood: Secondary | ICD-10-CM | POA: Diagnosis not present

## 2024-03-29 DIAGNOSIS — M069 Rheumatoid arthritis, unspecified: Secondary | ICD-10-CM

## 2024-03-29 DIAGNOSIS — F02C0 Dementia in other diseases classified elsewhere, severe, without behavioral disturbance, psychotic disturbance, mood disturbance, and anxiety: Secondary | ICD-10-CM | POA: Diagnosis not present

## 2024-03-29 DIAGNOSIS — G308 Other Alzheimer's disease: Secondary | ICD-10-CM | POA: Diagnosis not present

## 2024-03-29 DIAGNOSIS — I10 Essential (primary) hypertension: Secondary | ICD-10-CM | POA: Diagnosis not present

## 2024-03-29 DIAGNOSIS — I480 Paroxysmal atrial fibrillation: Secondary | ICD-10-CM | POA: Diagnosis not present

## 2024-03-29 NOTE — Progress Notes (Signed)
 Location:  Medical illustrator of Service:  SNF (31) Provider:  Doak Free, MD  Patient Care Team: Marguerite Shiley, MD as PCP - General (Internal Medicine) Jacqueline Matsu, MD as PCP - Cardiology (Cardiology) Jolly Needle, MD (Inactive) as PCP - Electrophysiology (Cardiology) Tobin Forts, MD (Gastroenterology) Jolly Needle, MD (Inactive) (Cardiology) Alanson Alliance, MD as Consulting Physician (Rheumatology) Secundino Dach Harvey Linen., MD as Consulting Physician (Urology) Alvina Axon, MD as Consulting Physician (Ophthalmology) Vernida Goodie (Dentistry) Anselmo Kings, MD as Referring Physician (Allergy and Immunology) Jhonny Moss, MD as Consulting Physician (Neurology) Jonathan Neighbor, [facility] (Inactive) as Pharmacist (Pharmacist) Jhonny Moss, MD as Consulting Physician (Neurology)  Extended Emergency Contact Information Primary Emergency Contact: Viar,[name] Address: [address]          [place], [place] Home Phone: [phone] Work Phone: [phone] Mobile Phone: [phone] Relation: Spouse Secondary Emergency Contact: [name],[name] Home Phone: [phone] Mobile Phone: [phone] Relation: Daughter  Code Status:DNR Goals of care: Advanced Directive information    02/21/2024    2:11 PM  Advanced Directives  Does Patient Have a Medical Advance Directive? Yes  Type of Advance Directive Out of facility DNR (pink MOST or yellow form)     Chief Complaint  Patient presents with   Medical Management of Chronic Issues    HPI:  Pt is a 89 y.o. male seen today for medical management of chronic diseases.    PMH Afib on eliquis ,  hx of CAD with PCI, NSTEMI. GERD  Hx of sleep apnea, no longer using cpap  RA off Orencia , follows with Rheumatology HLD on Repatha , zetia , crestor   Hx of depression on lexapro  and remeron  Multiple falls with injuries including subdural hematoma  in May of 2024  Fell on January 05, 2024, resulting in a laceration to the left eyebrow. He was found on the floor in his bedroom on his left side. The laceration was treated in the emergency department with five absorbable sutures. A CT scan of the head and neck showed no acute abnormalities but did reveal chronic microvascular ischemic disease and cerebral atrophy.   Nursing notes indicate periods of agitation and anger. Attempts to get OOB without help. Haldol  helps relieve the agitation which is used prn.   Past Medical History:  Diagnosis Date   CAD (coronary artery disease)    a. s/p inferior STEMI 01/08/2014; LHC 01/08/14: total RCA occlusion s/p 3.5x35mm Xience DES distal RCA and 3.5x28 mm DES mid RCA, 60-70% mid LAD stenosis, EF 55%.   Complication of anesthesia    'long to wake up after back surgery " 09/2003   Diverticulosis of colon    GERD (gastroesophageal reflux disease)    History of cellulitis    05-26-2015  LLE   History of colon polyps    1998- benign/  2008 adenomatous    History of kidney stones    2013   History of squamous cell carcinoma excision    2013;  2015;  06-12-2015 right leg/  02/ and 05/ 2017  left ear and left leg   Hydronephrosis, left    Hyperlipidemia    Hypertension    Migraine with aura    OSA on CPAP 06/19/2015   Moderate OSA with AHI 18/hr  per study 05-20-2015   Osteoarthritis    Paroxysmal atrial fibrillation (HCC) 4/16   chads2vasc score is at least 4   Premature atrial contractions  RA (rheumatoid arthritis) [facility])    rheumatologist-  dr Kirtland Perfect   Sinusitis, chronic 10/17/2015   Wears glasses    Wears hearing aid    bilateral   Past Surgical History:  Procedure Laterality Date   BACK SURGERY     CARDIOVASCULAR STRESS TEST  11/21/2015   Low risk nuclear study w/ a small diaphragmatic attenuation artifact, no ischemia/  normal LV function and wall motion , ef 63%   CATARACT EXTRACTION W/ INTRAOCULAR LENS  IMPLANT, BILATERAL   2015   COLONOSCOPY  last one 06-09-2011   CORONARY ANGIOPLASTY     CYSTOSCOPY W/ RETROGRADES Left 07/12/2016   Procedure: CYSTOSCOPY WITH RETROGRADE PYELOGRAM LEFT URETERAL STENT;  Surgeon: Homero Luster, MD;  Location: [facility];  Service: Urology;  Laterality: Left;   CYSTOSCOPY WITH RETROGRADE PYELOGRAM, URETEROSCOPY AND STENT PLACEMENT Left 08/11/2016   Procedure: CYSTOSCOPY WITH RETROGRADE PYELOGRAM,  DIAGNOSTIC URETEROSCOPY , STENT EXCHANGE;  Surgeon: Osborn Blaze, MD;  Location: [facility];  Service: Urology;  Laterality: Left;   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   severe fibromatosis palm and fingers   LEFT HEART CATHETERIZATION WITH CORONARY ANGIOGRAM N/A 01/08/2014   Procedure: LEFT HEART CATHETERIZATION WITH CORONARY ANGIOGRAM;  Surgeon: Millicent Ally, MD;  Location[facility] C[facility] LAB;  Service: Cardiovascular;  Laterality:N/A;  total/ subtotal RCA/  mLAD 60-70% w/ mid systolic bridging/  preserved global LVF, ef 55%   MOHS SURGERY  x2  feb and may 2017   left ear /  left leg  (SCC)   ORIF ACETABULAR FRACTURE Right 03/29/2023   Procedure: OPEN REDUCTION INTERNAL FIXATION (ORIF) ACETABULAR FRACTURE;  Surgeon: Hardy Lia, MD;  Location: [facility] OR;  Service: Orthopedics;  Laterality: Right;   PERCUTANEOUS CORONARY STENT INTERVENTION (PCI-S)  01/08/2014   Procedure: PERCUTANEOUS CORONARY STENT INTERVENTION (PCI-S);  Surgeon: Millicent Ally, MD;  Location: [facility] CATH LAB;  Service: Cardiovascular;;  DES to mid and distal RCA   POSTERIOR LUMBAR FUSION  10/01/2003   and Laminectomy/ diskectomy  L4 -- S1   ROBOT ASSISTED PYELOPLASTY Left 09/15/2016   Procedure: XI ROBOTIC ASSISTED PYELOPLASTY;  Surgeon: Osborn Blaze, MD;  Location: [facility];  Service: Urology;  Laterality: Left;   TONSILLECTOMY     TRANSTHORACIC ECHOCARDIOGRAM  02/23/2016   mild LVH,  ef 50-55%,  grade 1 diastolic dysfunction/  mild to moderate AV calcification w/ no stenosis or regurg./  trivial MR and TR/  mild  PR    Allergies  Allergen Reactions   Methotrexate Other (See Comments)    REACTION: tachycardia   Aricept  [Donepezil  Hcl] Other (See Comments)    Nightmares. Pt takes this medication per [facility]    Crestor  [Rosuvastatin  Calcium ] Other (See Comments)    Myalgias with 20mg  dose   Lisinopril  Other (See Comments)    cough   Namenda  [Memantine ] Diarrhea and Nausea Only   Atorvastatin  Other (See Comments)    Muscle pain, leg cramps   Depakote  [Divalproex  Sodium] Rash    Allergy not listed on MAR    Pravastatin Sodium Other (See Comments)    REACTION: cramps, fatigue    Outpatient Encounter Medications as of 03/29/2024  Medication Sig   acetaminophen  (TYLENOL ) 325 MG tablet Take 2 tablets (650 mg total) by mouth in the morning and at bedtime.   apixaban  (ELIQUIS ) 5 MG TABS tablet Take 1 tablet (5 mg total) by mouth 2 (two) times daily.   camphor-menthol (SARNA) lotion Apply 1 Application topically 2 (two) times daily as  needed for itching.   docusate sodium  (COLACE) 100 MG capsule Take 1 capsule (100 mg total) by mouth 2 (two) times daily.   Emollient (CETAPHIL) cream Apply 1 Application topically at bedtime.   escitalopram  (LEXAPRO ) 20 MG tablet Take 20 mg by mouth daily.   Evolocumab  (REPATHA  SURECLICK) 140 MG/ML SOAJ INJECT 1 PEN INTO SKIN EVERY 14 DAYS   ezetimibe  (ZETIA ) 10 MG tablet Take 5 mg by mouth daily.   haloperidol  (HALDOL ) 1 MG tablet Take 1 mg by mouth daily as needed for agitation.   haloperidol  (HALDOL ) 1 MG tablet Take 1 mg by mouth 2 (two) times daily.   haloperidol  lactate (HALDOL ) 5 MG/ML injection Inject 0.5 mLs (2.5 mg total) into the muscle daily as needed.   hydrOXYzine  (VISTARIL ) 25 MG capsule Take 25 mg by mouth in the morning and at bedtime.   LORazepam  (ATIVAN ) 0.5 MG tablet Take 0.5 mg by mouth 2 (two) times daily. 1 tab, oral, Twice a day Increase Ativan  to 0.5mg  BID   melatonin 5 MG TABS Take 5 mg by mouth at bedtime.   methocarbamol  (ROBAXIN ) 500 MG tablet  Take 1 tablet (500 mg total) by mouth every 6 (six) hours as needed for muscle spasms.   metoprolol  tartrate (LOPRESSOR ) 25 MG tablet Take 25 mg by mouth 2 (two) times daily. Hold for SBP <100   mirtazapine (REMERON SOL-TAB) 15 MG disintegrating tablet Take 15 mg by mouth at bedtime.   Multiple Vitamin (MULTIVITAMIN WITH MINERALS) TABS tablet Take 1 tablet by mouth daily.   nitroGLYCERIN  (NITROSTAT ) 0.4 MG SL tablet Place 0.4 mg under the tongue every 5 (five) minutes as needed for chest pain.   omeprazole  (PRILOSEC) 40 MG capsule Take 40 mg by mouth in the morning. Take on empty stomach 30 minutes before breakfast.   risperiDONE (RISPERDAL) 1 MG tablet Take 1 mg by mouth in the morning and at bedtime.   rosuvastatin  (CRESTOR ) 5 MG tablet Take 5 mg by mouth every morning.   triamcinolone  cream (KENALOG ) 0.1 % Apply 1 Application topically daily as needed (rash / itching).   [DISCONTINUED] apixaban  (ELIQUIS ) 2.5 MG TABS tablet Take 1 tablet (2.5 mg total) by mouth 2 (two) times daily.   No facility-administered encounter medications on file as of 03/29/2024.    Review of Systems  Unable to perform ROS: Dementia    Immunization History  Administered Date(s) Administered   Fluad Quad(high Dose 65+) 07/09/2019, 08/15/2020, 09/16/2021, 07/29/2022, 09/06/2023   Influenza Split 08/10/2011, 08/15/2012   Influenza Whole 08/21/2009, 09/22/2010   Influenza, High Dose Seasonal PF 09/16/2015, 08/18/2016, 08/31/2017, 08/14/2018   Influenza,inj,Quad PF,6+ Mos 08/06/2013   Influenza-Unspecified 08/31/2017   Moderna Covid-19 Vaccine  Bivalent Booster 29yrs & up 09/06/2023   PFIZER(Purple Top)SARS-COV-2 Vaccination 12/11/2019, 01/01/2020, 08/12/2020   Pfizer Covid-19 Vaccine Bivalent Booster 57yrs & up 03/11/2023   Pneumococcal Conjugate-13 10/02/2013   Pneumococcal Polysaccharide-23 06/04/2010   Tdap 10/22/2011, 09/22/2022   Zoster Recombinant(Shingrix) 03/09/2017, 05/10/2017   Pertinent  Health  Maintenance Due  Topic Date Due   INFLUENZA VACCINE  06/15/2024      11/30/2022    9:57 AM 05/03/2023    4:08 PM 08/15/2023   10:15 AM 08/18/2023    4:26 PM 11/01/2023   12:07 PM  Fall Risk  Falls in the past year? 1 1 0 1 0  Was there an injury with Fall? 1 0 0 1 0  Fall Risk Category Calculator 2 2 0 3 0  Patient at Risk for Falls  Due to No Fall Risks  No Fall Risks History of fall(s)   Fall risk Follow up Falls evaluation completed Falls evaluation completed Falls evaluation completed Falls evaluation completed Falls evaluation completed   Functional Status Survey:    Vitals:   03/29/24 1645  BP: 137/64  Pulse: 71  Resp: 18  Temp: (!) 97.5 F (36.4 C)  SpO2: 95%  Weight: 181 lb 6.4 oz (82.3 kg)   Body mass index is 24.6 kg/m. Wt Readings from Last 3 Encounters:  03/29/24 181 lb 6.4 oz (82.3 kg)  02/27/24 178 lb 9.6 oz (81 kg)  02/21/24 172 lb (78 kg)    Physical Exam Vitals and nursing note reviewed.  Constitutional:      General: He is not in acute distress.    Appearance: He is not diaphoretic.  HENT:     Mouth/Throat:     Mouth: Mucous membranes are moist.     Pharynx: Oropharynx is clear.  Neck:     Thyroid : No thyromegaly.     Vascular: No JVD.     Trachea: No tracheal deviation.  Cardiovascular:     Rate and Rhythm: Normal rate and regular rhythm.     Heart sounds: No murmur heard. Pulmonary:     Effort: Pulmonary effort is normal. No respiratory distress.     Breath sounds: Normal breath sounds. No wheezing.  Abdominal:     General: Bowel sounds are normal. There is no distension.     Palpations: Abdomen is soft.     Tenderness: There is no abdominal tenderness.  Musculoskeletal:     Right lower leg: No edema.     Left lower leg: No edema.  Lymphadenopathy:     Cervical: No cervical adenopathy.  Skin:    General: Skin is warm and dry.  Neurological:     General: No focal deficit present.     Mental Status: He is alert. Mental status is at  baseline.     Labs reviewed: Recent Labs    03/31/23 0219 04/05/23 0000 01/02/24 0557 01/05/24 1805 01/06/24 0000 02/21/24 1452  NA 135   < > 135 139 141 138  K 3.5   < > 3.4* 3.6 3.5 3.6  CL 102   < > 105 107 107 106  CO2 23   < > 20* 24 21 24   GLUCOSE 112*   < > 112* 96  --  81  BUN 20   < > 21 15 13 16   CREATININE 0.77   < > 0.76 0.88 0.7 0.89  CALCIUM  8.3*   < > 8.4* 8.5* 8.5* 8.7*  MG 2.1  --  1.9  --   --   --    < > = values in this interval not displayed.   Recent Labs    01/01/24 0004 01/02/24 0557 02/21/24 1452  AST 24 35 28  ALT 29 33 31  ALKPHOS 55 51 60  BILITOT 0.5 0.5 0.6  PROT 6.6 6.6 6.7  ALBUMIN  3.2* 3.0* 3.5   Recent Labs    01/01/24 0004 01/02/24 0557 01/05/24 1805 02/21/24 1435  WBC 6.1 4.2 4.1 7.7  NEUTROABS 5.0  --  2.0 4.4  HGB 11.0* 10.7* 11.7* 12.0*  HCT 32.8* 32.2* 34.6* 36.3*  MCV 91.9 92.8 90.8 93.6  PLT 227 196 229 283   Lab Results  Component Value Date   TSH 0.79 01/26/2023   Lab Results  Component Value Date   HGBA1C 6.4 08/30/2017  Lab Results  Component Value Date   CHOL 126 01/26/2023   HDL 36 01/26/2023   LDLCALC 39 01/26/2023   LDLDIRECT 168.9 05/24/2013   TRIG 254 (A) 01/26/2023   CHOLHDL 4.2 08/04/2020    Significant Diagnostic Results in last 30 days:  No results found.   Assessment/Plan  1. Severe Alzheimer's dementia of other onset without behavioral disturbance, psychotic disturbance, mood disturbance, or anxiety (HCC) (Primary) Followed by Dr Levie Ream Behaviors are fairly well controlled on Risperdal, Lexapro , vistaril , haldol , and remeron  2. Paroxysmal atrial fibrillation (HCC) Rate is controlled on metoprolol  ON eliquis  for CVA risk reduction  3. Primary hypertension Controlled  4. Coronary artery disease involving native coronary artery of native heart without angina pectoris Hx of MI On Repatha , Zetia , eliquis   5. Rheumatoid arthritis involving elbow, unspecified laterality,  unspecified whether rheumatoid factor present (HCC) Off orencia  No complaints of pain  6. Mixed hyperlipidemia Lab Results  Component Value Date   LDLCALC 39 01/26/2023  On repatha , zetia , crestor   7. Depression associated with dementia.  No signs of depression at this time on lexapro  and remeron  Family/ staff Communication: nurse  Labs/tests ordered: CBC CMP TSH 04/02/24   Total time :  time greater than 50% of total time spent doing pt counseling and coordination of care

## 2024-03-30 DIAGNOSIS — R278 Other lack of coordination: Secondary | ICD-10-CM | POA: Diagnosis not present

## 2024-03-30 DIAGNOSIS — R531 Weakness: Secondary | ICD-10-CM | POA: Diagnosis not present

## 2024-03-30 DIAGNOSIS — F02B3 Dementia in other diseases classified elsewhere, moderate, with mood disturbance: Secondary | ICD-10-CM | POA: Diagnosis not present

## 2024-04-02 DIAGNOSIS — Z79899 Other long term (current) drug therapy: Secondary | ICD-10-CM | POA: Diagnosis not present

## 2024-04-02 LAB — BASIC METABOLIC PANEL WITH GFR
BUN: 19 (ref 4–21)
CO2: 21 (ref 13–22)
Chloride: 106 (ref 99–108)
Creatinine: 0.8 (ref 0.6–1.3)
Glucose: 102
Potassium: 4.1 meq/L (ref 3.5–5.1)
Sodium: 139 (ref 137–147)

## 2024-04-02 LAB — CBC AND DIFFERENTIAL
HCT: 34 — AB (ref 41–53)
Hemoglobin: 11.5 — AB (ref 13.5–17.5)
Platelets: 298 10*3/uL (ref 150–400)
WBC: 6.8

## 2024-04-02 LAB — COMPREHENSIVE METABOLIC PANEL WITH GFR
Albumin: 3.4 — AB (ref 3.5–5.0)
Calcium: 8.5 — AB (ref 8.7–10.7)
Globulin: 2.8
eGFR: 86

## 2024-04-02 LAB — HEPATIC FUNCTION PANEL
ALT: 24 U/L (ref 10–40)
AST: 17 (ref 14–40)
Alkaline Phosphatase: 81 (ref 25–125)
Bilirubin, Total: 0.3

## 2024-04-02 LAB — TSH: TSH: 1.27 (ref 0.41–5.90)

## 2024-04-02 LAB — CBC: RBC: 3.63 — AB (ref 3.87–5.11)

## 2024-04-03 DIAGNOSIS — R531 Weakness: Secondary | ICD-10-CM | POA: Diagnosis not present

## 2024-04-03 DIAGNOSIS — F02B3 Dementia in other diseases classified elsewhere, moderate, with mood disturbance: Secondary | ICD-10-CM | POA: Diagnosis not present

## 2024-04-03 DIAGNOSIS — R278 Other lack of coordination: Secondary | ICD-10-CM | POA: Diagnosis not present

## 2024-04-18 ENCOUNTER — Non-Acute Institutional Stay (SKILLED_NURSING_FACILITY): Payer: Self-pay | Admitting: Orthopedic Surgery

## 2024-04-18 ENCOUNTER — Encounter: Payer: Self-pay | Admitting: Orthopedic Surgery

## 2024-04-18 DIAGNOSIS — R400 Somnolence: Secondary | ICD-10-CM | POA: Diagnosis not present

## 2024-04-18 DIAGNOSIS — F03911 Unspecified dementia, unspecified severity, with agitation: Secondary | ICD-10-CM | POA: Diagnosis not present

## 2024-04-18 MED ORDER — RISPERIDONE 0.5 MG PO TABS: 0.7500 mg | ORAL_TABLET | Freq: Every morning | ORAL | Status: DC

## 2024-04-18 NOTE — Progress Notes (Signed)
 Location:  [facility] Room Number: 124/A Place of Service:  SNF [phone]) Provider:  Arnetha Bhat, NP   Marguerite Shiley, MD  Patient Care Team: Marguerite Shiley, MD as PCP - General (Internal Medicine) Jacqueline Matsu, MD as PCP - Cardiology (Cardiology) Jolly Needle, MD (Inactive) as PCP - Electrophysiology (Cardiology) Tobin Forts, MD (Gastroenterology) Jolly Needle, MD (Inactive) (Cardiology) Alanson Alliance, MD as Consulting Physician (Rheumatology) Secundino Dach Harvey Linen., MD as Consulting Physician (Urology) Alvina Axon, MD as Consulting Physician (Ophthalmology) Vernida Goodie (Dentistry) Anselmo Kings, MD as Referring Physician (Allergy and Immunology) Jhonny Moss, MD as Consulting Physician (Neurology) Jonathan Neighbor, [facility] (Inactive) as Pharmacist (Pharmacist) Jhonny Moss, MD as Consulting Physician (Neurology)  Extended Emergency Contact Information Primary Emergency Contact: Sherk,[name] Address: [address]          [place], [place] Home Phone: [phone] Work Phone: [phone] Mobile Phone: [phone] Relation: Spouse Secondary Emergency Contact: [name],[name] Home Phone: [phone] Mobile Phone: [phone] Relation: Daughter  Code Status:  DNR Goals of care: Advanced Directive information    02/21/2024    2:11 PM  Advanced Directives  Does Patient Have a Medical Advance Directive? Yes  Type of Advance Directive Out of facility DNR (pink MOST or yellow form)     Chief Complaint  Patient presents with   Acute Visit    drowsiness    HPI:  Pt is a 89 y.o. male seen today for acute visit due to increased morning drowsiness.   He currently resides on the skilled nursing unit due to Alzheimer's dementia. PMH: CAD s/p STEMI 2015, HTN, HLD, PAF, GERD, frequent falls s/p subdural hematoma 09/2022, left hydronephrosis, RA, migraine, OSA, OA, depression and insomnia.   Poor  historian due to dementia. Nursing reports increased drowsiness in morning. He is sleeping more in the morning and skipping either breakfast or lunch. He is followed by Dr. Levie Ream due to agitation with dementia. He continues to have intermittent episodes of agitation in the evening. Unsuccessful trial of Rexulti . Behaviors have improved some since starting Risperdal 02/2024. Unable to take Depakote  and Namenda  due to allergies. He is also taking Lexapro , Remeron, Vistaril , Ativan  and Haldo prn. Weights have varied 3-4 lbs within the past 4 months. He admits to feeling tired during encounter. Afebrile. Vitals stable.   Past Medical History:  Diagnosis Date   CAD (coronary artery disease)    a. s/p inferior STEMI 01/08/2014; LHC 01/08/14: total RCA occlusion s/p 3.5x59mm Xience DES distal RCA and 3.5x28 mm DES mid RCA, 60-70% mid LAD stenosis, EF 55%.   Complication of anesthesia    'long to wake up after back surgery " 09/2003   Diverticulosis of colon    GERD (gastroesophageal reflux disease)    History of cellulitis    05-26-2015  LLE   History of colon polyps    1998- benign/  2008 adenomatous    History of kidney stones    2013   History of squamous cell carcinoma excision    2013;  2015;  06-12-2015 right leg/  02/ and 05/ 2017  left ear and left leg   Hydronephrosis, left    Hyperlipidemia    Hypertension    Migraine with aura    OSA on CPAP 06/19/2015   Moderate OSA with AHI 18/hr  per study 05-20-2015   Osteoarthritis    Paroxysmal atrial fibrillation (HCC) 4/16   chads2vasc score is at least  4   Premature atrial contractions    RA (rheumatoid arthritis) [facility])    rheumatologist-  dr Kirtland Perfect   Sinusitis, chronic 10/17/2015   Wears glasses    Wears hearing aid    bilateral   Past Surgical History:  Procedure Laterality Date   BACK SURGERY     CARDIOVASCULAR STRESS TEST  11/21/2015   Low risk nuclear study w/ a small diaphragmatic attenuation artifact, no ischemia/   normal LV function and wall motion , ef 63%   CATARACT EXTRACTION W/ INTRAOCULAR LENS  IMPLANT, BILATERAL  2015   COLONOSCOPY  last one 06-09-2011   CORONARY ANGIOPLASTY     CYSTOSCOPY W/ RETROGRADES Left 07/12/2016   Procedure: CYSTOSCOPY WITH RETROGRADE PYELOGRAM LEFT URETERAL STENT;  Surgeon: Homero Luster, MD;  Location: [facility];  Service: Urology;  Laterality: Left;   CYSTOSCOPY WITH RETROGRADE PYELOGRAM, URETEROSCOPY AND STENT PLACEMENT Left 08/11/2016   Procedure: CYSTOSCOPY WITH RETROGRADE PYELOGRAM,  DIAGNOSTIC URETEROSCOPY , STENT EXCHANGE;  Surgeon: Osborn Blaze, MD;  Location: [facility];  Service: Urology;  Laterality: Left;   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   severe fibromatosis palm and fingers   LEFT HEART CATHETERIZATION WITH CORONARY ANGIOGRAM N/A 01/08/2014   Procedure: LEFT HEART CATHETERIZATION WITH CORONARY ANGIOGRAM;  Surgeon: Millicent Ally, MD;  Location: [facility] CATH LAB;  Service: Cardiovascular;  Laterality:N/A;  total/ subtotal RCA/  mLAD 60-70% w/ mid systolic bridging/  preserved global LVF, ef 55%   MOHS SURGERY  x2  feb and may 2017   left ear /  left leg  (SCC)   ORIF ACETABULAR FRACTURE Right 03/29/2023   Procedure: OPEN REDUCTION INTERNAL FIXATION (ORIF) ACETABULAR FRACTURE;  Surgeon: Hardy Lia, MD;  Location: [facility] OR;  Service: Orthopedics;  Laterality: Right;   PERCUTANEOUS CORONARY STENT INTERVENTION (PCI-S)  01/08/2014   Procedure: PERCUTANEOUS CORONARY STENT INTERVENTION (PCI-S);  Surgeon: Millicent Ally, MD;  Location: [facility] CATH LAB;  Service: Cardiovascular;;  DES to mid and distal RCA   POSTERIOR LUMBAR FUSION  10/01/2003   and Laminectomy/ diskectomy  L4 -- S1   ROBOT ASSISTED PYELOPLASTY Left 09/15/2016   Procedure: XI ROBOTIC ASSISTED PYELOPLASTY;  Surgeon: Osborn Blaze, MD;  Location: [facility];  Service: Urology;  Laterality: Left;   TONSILLECTOMY     TRANSTHORACIC ECHOCARDIOGRAM  02/23/2016   mild LVH,  ef 50-55%,  grade 1  diastolic dysfunction/  mild to moderate AV calcification w/ no stenosis or regurg./  trivial MR and TR/  mild PR    Allergies  Allergen Reactions   Methotrexate Other (See Comments)    REACTION: tachycardia   Aricept  [Donepezil  Hcl] Other (See Comments)    Nightmares. Pt takes this medication per [facility]    Crestor  [Rosuvastatin  Calcium ] Other (See Comments)    Myalgias with 20mg  dose   Lisinopril  Other (See Comments)    cough   Namenda  [Memantine ] Diarrhea and Nausea Only   Atorvastatin  Other (See Comments)    Muscle pain, leg cramps   Depakote  [Divalproex  Sodium] Rash    Allergy not listed on MAR    Pravastatin Sodium Other (See Comments)    REACTION: cramps, fatigue    Outpatient Encounter Medications as of 04/18/2024  Medication Sig   acetaminophen  (TYLENOL ) 325 MG tablet Take 2 tablets (650 mg total) by mouth in the morning and at bedtime.   apixaban  (ELIQUIS ) 5 MG TABS tablet Take 1 tablet (5 mg total) by mouth 2 (two) times daily.   camphor-menthol (SARNA) lotion  Apply 1 Application topically 2 (two) times daily as needed for itching.   docusate sodium  (COLACE) 100 MG capsule Take 1 capsule (100 mg total) by mouth 2 (two) times daily.   Emollient (CETAPHIL) cream Apply 1 Application topically at bedtime.   escitalopram  (LEXAPRO ) 20 MG tablet Take 20 mg by mouth daily.   Evolocumab  (REPATHA  SURECLICK) 140 MG/ML SOAJ INJECT 1 PEN INTO SKIN EVERY 14 DAYS   ezetimibe  (ZETIA ) 10 MG tablet Take 5 mg by mouth daily.   haloperidol  (HALDOL ) 1 MG tablet Take 1 mg by mouth 2 (two) times daily as needed for agitation.   hydrOXYzine  (VISTARIL ) 25 MG capsule Take 25 mg by mouth in the morning and at bedtime.   LORazepam  (ATIVAN ) 0.5 MG tablet Take 0.5 mg by mouth 2 (two) times daily. 1 tab, oral, Twice a day Increase Ativan  to 0.5mg  BID   melatonin 5 MG TABS Take 5 mg by mouth at bedtime.   methocarbamol  (ROBAXIN ) 500 MG tablet Take 1 tablet (500 mg total) by mouth every 6 (six) hours as  needed for muscle spasms.   metoprolol  tartrate (LOPRESSOR ) 25 MG tablet Take 25 mg by mouth 2 (two) times daily. Hold for SBP <100   mirtazapine (REMERON SOL-TAB) 15 MG disintegrating tablet Take 15 mg by mouth at bedtime.   Multiple Vitamin (MULTIVITAMIN WITH MINERALS) TABS tablet Take 1 tablet by mouth daily.   nitroGLYCERIN  (NITROSTAT ) 0.4 MG SL tablet Place 0.4 mg under the tongue every 5 (five) minutes as needed for chest pain.   omeprazole  (PRILOSEC) 40 MG capsule Take 40 mg by mouth in the morning. Take on empty stomach 30 minutes before breakfast.   risperiDONE (RISPERDAL) 1 MG tablet Take 1 mg by mouth in the morning and at bedtime.   rosuvastatin  (CRESTOR ) 5 MG tablet Take 5 mg by mouth every morning.   triamcinolone  cream (KENALOG ) 0.1 % Apply 1 Application topically daily as needed (rash / itching).   [DISCONTINUED] apixaban  (ELIQUIS ) 2.5 MG TABS tablet Take 1 tablet (2.5 mg total) by mouth 2 (two) times daily.   No facility-administered encounter medications on file as of 04/18/2024.    Review of Systems  Unable to perform ROS: Dementia    Immunization History  Administered Date(s) Administered   Fluad Quad(high Dose 65+) 07/09/2019, 08/15/2020, 09/16/2021, 07/29/2022, 09/06/2023   Influenza Split 08/10/2011, 08/15/2012   Influenza Whole 08/21/2009, 09/22/2010   Influenza, High Dose Seasonal PF 09/16/2015, 08/18/2016, 08/31/2017, 08/14/2018   Influenza,inj,Quad PF,6+ Mos 08/06/2013   Influenza-Unspecified 08/31/2017   Moderna Covid-19 Vaccine  Bivalent Booster 13yrs & up 09/06/2023   PFIZER(Purple Top)SARS-COV-2 Vaccination 12/11/2019, 01/01/2020, 08/12/2020   Pfizer Covid-19 Vaccine Bivalent Booster 15yrs & up 03/11/2023   Pneumococcal Conjugate-13 10/02/2013   Pneumococcal Polysaccharide-23 06/04/2010   Tdap 10/22/2011, 09/22/2022   Zoster Recombinant(Shingrix) 03/09/2017, 05/10/2017   Pertinent  Health Maintenance Due  Topic Date Due   INFLUENZA VACCINE  06/15/2024       11/30/2022    9:57 AM 05/03/2023    4:08 PM 08/15/2023   10:15 AM 08/18/2023    4:26 PM 11/01/2023   12:07 PM  Fall Risk  Falls in the past year? 1 1 0 1 0  Was there an injury with Fall? 1 0 0 1 0  Fall Risk Category Calculator 2 2 0 3 0  Patient at Risk for Falls Due to No Fall Risks  No Fall Risks History of fall(s)   Fall risk Follow up Falls evaluation completed Falls evaluation completed  Falls evaluation completed Falls evaluation completed Falls evaluation completed   Functional Status Survey:    Vitals:   04/18/24 1334  BP: (!) 106/59  Pulse: (!) 55  Resp: 18  Temp: (!) 97.5 F (36.4 C)  SpO2: 97%  Weight: 181 lb 6.4 oz (82.3 kg)  Height: 6' (1.829 m)   Body mass index is 24.6 kg/m. Physical Exam Vitals reviewed.  Constitutional:      General: He is not in acute distress.    Appearance: He is not ill-appearing.  HENT:     Head: Normocephalic.  Eyes:     General:        Right eye: No discharge.        Left eye: No discharge.     Pupils: Pupils are equal, round, and reactive to light.  Cardiovascular:     Rate and Rhythm: Normal rate. Rhythm irregular.     Pulses: Normal pulses.     Heart sounds: Normal heart sounds.  Pulmonary:     Effort: Pulmonary effort is normal.     Breath sounds: Normal breath sounds.  Abdominal:     General: Bowel sounds are normal.     Palpations: Abdomen is soft.  Musculoskeletal:     Cervical back: Neck supple.     Right lower leg: Edema present.     Left lower leg: Edema present.     Comments: Non pitting, compression stockings on  Skin:    General: Skin is warm.     Capillary Refill: Capillary refill takes less than 2 seconds.  Neurological:     General: No focal deficit present.     Mental Status: He is alert. Mental status is at baseline.     Motor: Weakness present.     Gait: Gait abnormal.  Psychiatric:        Mood and Affect: Mood normal.     Comments: Alert to self, follows commands, pleasant mood       Labs reviewed: Recent Labs    01/02/24 0557 01/05/24 1805 01/06/24 0000 02/21/24 1452  NA 135 139 141 138  K 3.4* 3.6 3.5 3.6  CL 105 107 107 106  CO2 20* 24 21 24   GLUCOSE 112* 96  --  81  BUN 21 15 13 16   CREATININE 0.76 0.88 0.7 0.89  CALCIUM  8.4* 8.5* 8.5* 8.7*  MG 1.9  --   --   --    Recent Labs    01/01/24 0004 01/02/24 0557 02/21/24 1452  AST 24 35 28  ALT 29 33 31  ALKPHOS 55 51 60  BILITOT 0.5 0.5 0.6  PROT 6.6 6.6 6.7  ALBUMIN  3.2* 3.0* 3.5   Recent Labs    01/01/24 0004 01/02/24 0557 01/05/24 1805 02/21/24 1435  WBC 6.1 4.2 4.1 7.7  NEUTROABS 5.0  --  2.0 4.4  HGB 11.0* 10.7* 11.7* 12.0*  HCT 32.8* 32.2* 34.6* 36.3*  MCV 91.9 92.8 90.8 93.6  PLT 227 196 229 283   Lab Results  Component Value Date   TSH 0.79 01/26/2023   Lab Results  Component Value Date   HGBA1C 6.4 08/30/2017   Lab Results  Component Value Date   CHOL 126 01/26/2023   HDL 36 01/26/2023   LDLCALC 39 01/26/2023   LDLDIRECT 168.9 05/24/2013   TRIG 254 (A) 01/26/2023   CHOLHDL 4.2 08/04/2020    Significant Diagnostic Results in last 30 days:  No results found.  Assessment/Plan 1. Drowsiness (Primary) - per nursing sleeping  more in AM> skipping breakfast or lunch often - no weight loss - exam unremarkable - ? Progression of dementia versus polypharmacy - will reduce morning Risperdal to 0.75 mg po QAM - cont Risperidal 1 mg po qhs  2. Agitation due to dementia [facility]) - followed by Dr. Levie Ream - moved out of memory care a few months ago - MMSE due per nursing - unsuccessful trial Rexulti  - allergy to Depakote  and Namenda  - cont Lexapro , Remeron, Ativan , vistaril  and Haldol  prn - recommend decreasing stimulation in evening to lessen sundowning behaviors/agitation    Family/ staff Communication: plan discussed with patient and nurse  Labs/tests ordered:  none

## 2024-04-20 ENCOUNTER — Other Ambulatory Visit: Payer: Self-pay | Admitting: Adult Health

## 2024-04-20 MED ORDER — LORAZEPAM 0.5 MG PO TABS: 0.5000 mg | ORAL_TABLET | Freq: Two times a day (BID) | ORAL | 3 refills | Status: DC

## 2024-04-23 ENCOUNTER — Encounter: Payer: Self-pay | Admitting: Adult Health

## 2024-04-23 ENCOUNTER — Non-Acute Institutional Stay (SKILLED_NURSING_FACILITY): Payer: Self-pay | Admitting: Adult Health

## 2024-04-23 DIAGNOSIS — G308 Other Alzheimer's disease: Secondary | ICD-10-CM

## 2024-04-23 DIAGNOSIS — I10 Essential (primary) hypertension: Secondary | ICD-10-CM

## 2024-04-23 DIAGNOSIS — I2510 Atherosclerotic heart disease of native coronary artery without angina pectoris: Secondary | ICD-10-CM | POA: Diagnosis not present

## 2024-04-23 DIAGNOSIS — Z66 Do not resuscitate: Secondary | ICD-10-CM | POA: Diagnosis not present

## 2024-04-23 DIAGNOSIS — F02C0 Dementia in other diseases classified elsewhere, severe, without behavioral disturbance, psychotic disturbance, mood disturbance, and anxiety: Secondary | ICD-10-CM

## 2024-04-23 DIAGNOSIS — I480 Paroxysmal atrial fibrillation: Secondary | ICD-10-CM

## 2024-04-23 DIAGNOSIS — M069 Rheumatoid arthritis, unspecified: Secondary | ICD-10-CM

## 2024-04-23 DIAGNOSIS — F4321 Adjustment disorder with depressed mood: Secondary | ICD-10-CM

## 2024-04-23 NOTE — Progress Notes (Unsigned)
 Location:  [facility] Room Number: 124 A Place of Service:  SNF [id number]) Provider:  Raylene Calamity, NP     Patient Care Team: Marguerite Shiley, MD as PCP - General (Internal Medicine) Jacqueline Matsu, MD as PCP - Cardiology (Cardiology) Jolly Needle, MD (Inactive) as PCP - Electrophysiology (Cardiology) Tobin Forts, MD (Gastroenterology) Jolly Needle, MD (Inactive) (Cardiology) Alanson Alliance, MD as Consulting Physician (Rheumatology) Secundino Dach Harvey Linen., MD as Consulting Physician (Urology) Alvina Axon, MD as Consulting Physician (Ophthalmology) Vernida Goodie (Dentistry) Anselmo Kings, MD as Referring Physician (Allergy and Immunology) Jhonny Moss, MD as Consulting Physician (Neurology) Jonathan Neighbor, [facility] (Inactive) as Pharmacist (Pharmacist) Jhonny Moss, MD as Consulting Physician (Neurology)  Extended Emergency Contact Information Primary Emergency Contact: Klarich,[name] Address: [address]          [place], [place] Home Phone: [phone] Work Phone: [phone] Mobile Phone: [phone] Relation: Spouse Secondary Emergency Contact: [name],[name] Home Phone: [phone] Mobile Phone: [phone] Relation: Daughter  Code Status:  DNR Goals of care: Advanced Directive information    04/23/2024   10:45 AM  Advanced Directives  Does Patient Have a Medical Advance Directive? Yes  Type of Advance Directive Living will;Out of facility DNR (pink MOST or yellow form)  Does patient want to make changes to medical advance directive? No - Patient declined     Chief Complaint  Patient presents with   Medical Management of Chronic Issues    Routine Visist    HPI:  Pt is a 89 y.o. male seen today for medical management of chronic diseases.    PMH Afib on eliquis ,  hx of CAD with PCI, NSTEMI. GERD  Hx of sleep apnea, no longer using cpap  RA off Orencia , follows with  Rheumatology HLD on Repatha , zetia , crestor   Hx of depression on lexapro  and remeron. No reports of verbalization of depression or crying episodes.  Weight stable Wt Readings from Last 3 Encounters:  04/24/24 181 lb 6.4 oz (82.3 kg)  04/23/24 181 lb 6.4 oz (82.3 kg)  04/18/24 181 lb 6.4 oz (82.3 kg)     Afib: on eliquis  and metoprolol   Multiple falls with injuries including subdural hematoma in May of 2024  Dementia: can be angry and aggressive verbally and physically in the afternoon. Recently risperdal  was reduced due to daytime sleepiness. Nurse reports less lethargy.  A CT scan of the head and neck showed no acute abnormalities but did reveal chronic microvascular ischemic disease and cerebral atrophy.  Past Medical History:  Diagnosis Date   CAD (coronary artery disease)    a. s/p inferior STEMI 01/08/2014; LHC 01/08/14: total RCA occlusion s/p 3.5x80mm Xience DES distal RCA and 3.5x28 mm DES mid RCA, 60-70% mid LAD stenosis, EF 55%.   Complication of anesthesia    'long to wake up after back surgery  09/2003   Diverticulosis of colon    GERD (gastroesophageal reflux disease)    History of cellulitis    05-26-2015  LLE   History of colon polyps    1998- benign/  2008 adenomatous    History of kidney stones    2013   History of squamous cell carcinoma excision    2013;  2015;  06-12-2015 right leg/  02/ and 05/ 2017  left ear and left leg   Hydronephrosis, left    Hyperlipidemia    Hypertension    Migraine with aura    OSA on CPAP  06/19/2015   Moderate OSA with AHI 18/hr  per study 05-20-2015   Osteoarthritis    Paroxysmal atrial fibrillation (HCC) 4/16   chads2vasc score is at least 4   Premature atrial contractions    RA (rheumatoid arthritis) [facility])    rheumatologist-  dr Kirtland Perfect   Sinusitis, chronic 10/17/2015   Wears glasses    Wears hearing aid    bilateral   Past Surgical History:  Procedure Laterality Date   BACK SURGERY     CARDIOVASCULAR STRESS  TEST  11/21/2015   Low risk nuclear study w/ a small diaphragmatic attenuation artifact, no ischemia/  normal LV function and wall motion , ef 63%   CATARACT EXTRACTION W/ INTRAOCULAR LENS  IMPLANT, BILATERAL  2015   COLONOSCOPY  last one 06-09-2011   CORONARY ANGIOPLASTY     CYSTOSCOPY W/ RETROGRADES Left 07/12/2016   Procedure: CYSTOSCOPY WITH RETROGRADE PYELOGRAM LEFT URETERAL STENT;  Surgeon: Homero Luster, MD;  Location: [facility];  Service: Urology;  Laterality: Left;   CYSTOSCOPY WITH RETROGRADE PYELOGRAM, URETEROSCOPY AND STENT PLACEMENT Left 08/11/2016   Procedure: CYSTOSCOPY WITH RETROGRADE PYELOGRAM,  DIAGNOSTIC URETEROSCOPY , STENT EXCHANGE;  Surgeon: Osborn Blaze, MD;  [facility];  Service: Urology;  Laterality: Left;   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   severe fibromatosis palm and fingers   LEFT HEART CATHETERIZATION WITH CORONARY ANGIOGRAM N/A 01/08/2014   Procedure: LEFT HEART CATHETERIZATION WITH CORONARY ANGIOGRAM;  Surgeon: Millicent Ally, MD;  Location: [facility] CATH LAB;  Service: Cardiovascular;  Laterality:N/A;  total/ subtotal RCA/  mLAD 60-70% w/ mid systolic bridging/  preserved global LVF, ef 55%   MOHS SURGERY  x2  feb and may 2017   left ear /  left leg  (S[facility])   ORIF ACETABULAR FRACTURE Right 03/29/2023   Procedure: OPEN REDUCTION INTERNAL FIXATION (ORIF) ACETABULAR FRACTURE;  Surgeon: Hardy Lia, MD;  Location: [facility] OR;  Service: Orthopedics;  Laterality: Right;   PERCUTANEOUS CORONARY STENT INTERVENTION (PCI-S)  01/08/2014   Procedure: PERCUTANEOUS CORONARY STENT INTERVENTION (PCI-S);  Surgeon: Millicent Ally, MD;  Location: [facility] CATH LAB;  Service: Cardiovascular;;  DES to mid and distal RCA   POSTERIOR LUMBAR FUSION  10/01/2003   and Laminectomy/ diskectomy  L4 -- S1   ROBOT ASSISTED PYELOPLASTY Left 09/15/2016   Procedure: XI ROBOTIC ASSISTED PYELOPLASTY;  Surgeon: Osborn Blaze, MD;  Location: [facility];  Service: Urology;  Laterality:  Left;   TONSILLECTOMY     TRANSTHORACIC ECHOCARDIOGRAM  02/23/2016   mild LVH,  ef 50-55%,  grade 1 diastolic dysfunction/  mild to moderate AV calcification w/ no stenosis or regurg./  trivial MR and TR/  mild PR    Allergies  Allergen Reactions   Methotrexate Other (See Comments)    REACTION: tachycardia   Aricept  [Donepezil  Hcl] Other (See Comments)    Nightmares. Pt takes this medication per [facility]    Crestor  [Rosuvastatin  Calcium ] Other (See Comments)    Myalgias with 20mg  dose   Lisinopril  Other (See Comments)    cough   Namenda  [Memantine ] Diarrhea and Nausea Only   Atorvastatin  Other (See Comments)    Muscle pain, leg cramps   Depakote  [Divalproex  Sodium] Rash    Allergy not listed on MAR    Pravastatin Sodium Other (See Comments)    REACTION: cramps, fatigue    Outpatient Encounter Medications as of 04/23/2024  Medication Sig   acetaminophen  (TYLENOL ) 325 MG tablet Take 2 tablets (650 mg total) by mouth in  the morning and at bedtime.   apixaban  (ELIQUIS ) 5 MG TABS tablet Take 1 tablet (5 mg total) by mouth 2 (two) times daily.   camphor-menthol (SARNA) lotion Apply 1 Application topically 2 (two) times daily as needed for itching.   docusate sodium  (COLACE) 100 MG capsule Take 1 capsule (100 mg total) by mouth 2 (two) times daily.   Emollient (CETAPHIL) cream Apply 1 Application topically at bedtime.   escitalopram  (LEXAPRO ) 20 MG tablet Take 20 mg by mouth daily.   Evolocumab  (REPATHA  SURECLICK) 140 MG/ML SOAJ INJECT 1 PEN INTO SKIN EVERY 14 DAYS   haloperidol  (HALDOL ) 1 MG tablet Take 1 mg by mouth 2 (two) times daily as needed for agitation.   hydrOXYzine  (VISTARIL ) 25 MG capsule Take 25 mg by mouth in the morning and at bedtime.   LORazepam  (ATIVAN ) 0.5 MG tablet Take 1 tablet (0.5 mg total) by mouth 2 (two) times daily. 1 tab, oral, Twice a day Increase Ativan  to 0.5mg  BID   melatonin 5 MG TABS Take 5 mg by mouth at bedtime.   methocarbamol  (ROBAXIN ) 500 MG tablet Take  1 tablet (500 mg total) by mouth every 6 (six) hours as needed for muscle spasms.   metoprolol  tartrate (LOPRESSOR ) 25 MG tablet Take 25 mg by mouth 2 (two) times daily. Hold for SBP <100   mirtazapine (REMERON SOL-TAB) 15 MG disintegrating tablet Take 15 mg by mouth at bedtime.   Multiple Vitamin (MULTIVITAMIN WITH MINERALS) TABS tablet Take 1 tablet by mouth daily.   nitroGLYCERIN  (NITROSTAT ) 0.4 MG SL tablet Place 0.4 mg under the tongue every 5 (five) minutes as needed for chest pain.   omeprazole  (PRILOSEC) 40 MG capsule Take 40 mg by mouth in the morning. Take on empty stomach 30 minutes before breakfast.   risperiDONE  (RISPERDAL ) 0.5 MG tablet Take 1.5 tablets (0.75 mg total) by mouth every morning.   risperiDONE  (RISPERDAL ) 1 MG tablet Take 1 mg by mouth at bedtime.   rosuvastatin  (CRESTOR ) 5 MG tablet Take 5 mg by mouth every morning.   triamcinolone  cream (KENALOG ) 0.1 % Apply 1 Application topically daily as needed (rash / itching).   ezetimibe  (ZETIA ) 10 MG tablet Take 5 mg by mouth daily. (Patient not taking: Reported on 04/23/2024)   [DISCONTINUED] apixaban  (ELIQUIS ) 2.5 MG TABS tablet Take 1 tablet (2.5 mg total) by mouth 2 (two) times daily.   No facility-administered encounter medications on file as of 04/23/2024.    Review of Systems  Constitutional:  Negative for activity change, appetite change, chills, diaphoresis, fatigue and fever.  HENT:  Negative for congestion.   Respiratory:  Negative for cough, shortness of breath and wheezing.   Cardiovascular:  Negative for chest pain and leg swelling.  Gastrointestinal:  Negative for abdominal distention, abdominal pain, constipation, diarrhea, nausea and vomiting.  Genitourinary:  Negative for difficulty urinating, dysuria and urgency.  Musculoskeletal:  Positive for gait problem. Negative for back pain, myalgias and neck pain.  Skin:  Negative for rash.  Neurological:  Negative for dizziness and weakness.   Psychiatric/Behavioral:  Positive for agitation, behavioral problems and confusion.     Immunization History  Administered Date(s) Administered   Fluad Quad(high Dose 65+) 07/09/2019, 08/15/2020, 09/16/2021, 07/29/2022, 09/06/2023   Influenza Split 08/10/2011, 08/15/2012   Influenza Whole 08/21/2009, 09/22/2010   Influenza, High Dose Seasonal PF 09/16/2015, 08/18/2016, 08/31/2017, 08/14/2018   Influenza,inj,Quad PF,6+ Mos 08/06/2013   Influenza-Unspecified 08/31/2017   Moderna Covid-19 Vaccine  Bivalent Booster 34yrs & up 09/06/2023   PFIZER(Purple  Top)SARS-COV-2 Vaccination 12/11/2019, 01/01/2020, 08/12/2020   Pfizer Covid-19 Vaccine Bivalent Booster 3yrs & up 03/11/2023   Pneumococcal Conjugate-13 10/02/2013   Pneumococcal Polysaccharide-23 06/04/2010   Tdap 10/22/2011, 09/22/2022   Zoster Recombinant(Shingrix) 03/09/2017, 05/10/2017   Pertinent  Health Maintenance Due  Topic Date Due   INFLUENZA VACCINE  06/15/2024      11/30/2022    9:57 AM 05/03/2023    4:08 PM 08/15/2023   10:15 AM 08/18/2023    4:26 PM 11/01/2023   12:07 PM  Fall Risk  Falls in the past year? 1 1 0 1 0  Was there an injury with Fall? 1 0 0 1 0  Fall Risk Category Calculator 2 2 0 3 0  Patient at Risk for Falls Due to No Fall Risks  No Fall Risks History of fall(s)   Fall risk Follow up Falls evaluation completed Falls evaluation completed Falls evaluation completed Falls evaluation completed Falls evaluation completed   Functional Status Survey:    Vitals:   04/23/24 1018  BP: 121/74  Pulse: 69  Resp: 18  Temp: 98.1 F (36.7 C)  SpO2: 96%  Weight: 181 lb 6.4 oz (82.3 kg)  Height: 6' (1.829 m)   Body mass index is 24.6 kg/m. Physical Exam Vitals and nursing note reviewed.  Constitutional:      Appearance: Normal appearance.  HENT:     Head: Normocephalic and atraumatic.  Eyes:     Conjunctiva/sclera: Conjunctivae normal.     Pupils: Pupils are equal, round, and reactive to light.   Cardiovascular:     Rate and Rhythm: Normal rate and regular rhythm.     Heart sounds: No murmur heard. Pulmonary:     Effort: Pulmonary effort is normal. No respiratory distress.     Breath sounds: Normal breath sounds. No wheezing.  Abdominal:     General: Bowel sounds are normal. There is no distension.     Palpations: Abdomen is soft.     Tenderness: There is no abdominal tenderness.  Musculoskeletal:     Right lower leg: Edema (+1 ankle) present.     Left lower leg: Edema (+1 ankle) present.  Skin:    General: Skin is warm and dry.  Neurological:     Mental Status: He is alert. Mental status is at baseline.  Psychiatric:        Mood and Affect: Mood normal.     Labs reviewed: Recent Labs    01/02/24 0557 01/05/24 1805 01/06/24 0000 02/21/24 1452 04/02/24 0000  NA 135 139 141 138 139  K 3.4* 3.6 3.5 3.6 4.1  CL 105 107 107 106 106  CO2 20* 24 21 24 21   GLUCOSE 112* 96  --  81  --   BUN 21 15 13 16 19   CREATININE 0.76 0.88 0.7 0.89 0.8  CALCIUM  8.4* 8.5* 8.5* 8.7* 8.5*  MG 1.9  --   --   --   --    Recent Labs    01/01/24 0004 01/02/24 0557 02/21/24 1452 04/02/24 0000  AST 24 35 28 17  ALT 29 33 31 24  ALKPHOS 55 51 60 81  BILITOT 0.5 0.5 0.6  --   PROT 6.6 6.6 6.7  --   ALBUMIN  3.2* 3.0* 3.5 3.4*   Recent Labs    01/01/24 0004 01/02/24 0557 01/05/24 1805 02/21/24 1435 04/02/24 0000  WBC 6.1 4.2 4.1 7.7 6.8  NEUTROABS 5.0  --  2.0 4.4  --   HGB 11.0* 10.7* 11.7*  12.0* 11.5*  HCT 32.8* 32.2* 34.6* 36.3* 34*  MCV 91.9 92.8 90.8 93.6  --   PLT 227 196 229 283 298   Lab Results  Component Value Date   TSH 1.27 04/02/2024   Lab Results  Component Value Date   HGBA1C 6.4 08/30/2017   Lab Results  Component Value Date   CHOL 126 01/26/2023   HDL 36 01/26/2023   LDLCALC 39 01/26/2023   LDLDIRECT 168.9 05/24/2013   TRIG 254 (A) 01/26/2023   CHOLHDL 4.2 08/04/2020    Significant Diagnostic Results in last 30 days:  No results  found.  Assessment/Plan   Paroxysmal atrial fibrillation (HCC) On Eliquis  for CVA risk reduction  Regular on exam Rate controlled with metoprolol    Hypertension Controlled  CAD (coronary artery disease) On statin, zetia , repatha  No current issues.   Alzheimer's dementia (HCC) Not on aricept  or namenda  due to reaction Followed by Dr Levie Ream for behaviors.  Continues with some aggression but improved Severe stage Continue Risperdal  and prn haldol    Rheumatoid arthritis (HCC) No issues with pain Off orencia  Only using tylenol    Situational depression No signs of depression On lexapro  and remeron

## 2024-04-24 ENCOUNTER — Non-Acute Institutional Stay (SKILLED_NURSING_FACILITY): Payer: Self-pay | Admitting: Orthopedic Surgery

## 2024-04-24 ENCOUNTER — Encounter: Payer: Self-pay | Admitting: Orthopedic Surgery

## 2024-04-24 DIAGNOSIS — Z0000 Encounter for general adult medical examination without abnormal findings: Secondary | ICD-10-CM | POA: Diagnosis not present

## 2024-04-24 NOTE — Patient Instructions (Signed)
 [name] , Thank you for taking time to come for your Medicare Wellness Visit. I appreciate your ongoing commitment to your health goals. Please review the following plan we discussed and let me know if I can assist you in the future.   These are the goals we discussed:  Goals       Behavior Symptoms Management      Evidence-based guidance:  Assess for behavior changes by interviewing patient and family/caregiver (informant) using a standardized tool when possible.  Assess for signs/symptoms of underlying condition such as urinary tract infection, unmanaged pain or side effects of pharmacologic therapy when new or worsening agitation appears.  Identify triggers or exacerbating factors such as environmental changes, medication, depression or psychosis.  If behavior is dangerous to patient or family/caregiver and cannot be controlled, consider pharmacologic management, professional in-home care or hospitalization.  Prepare patient and family/caregiver for time-limited pharmacologic therapy that is prescribed only when behavior symptoms are severe and after careful assessment of the risks, benefits and type of dementia.  Monitor efficacy of pharmacologic therapy that may include antipsychotic for aggression, psychosis and agitation or SSRI (selective serotonin reuptake inhibitor) for mood or depressive symptoms.  When pharmacologic therapy has been tapered, assess symptoms monthly for at least 4 months after discontinuation to identify recurrence of symptoms.  Use nonpharmacologic approaches to manage behavior including referral to mental health provider for behavior therapy and psychoeducation for patient and caregiver.  Encourage use of complementary therapy directed at behavior and mood management such as aromatherapy, muscle-relaxation training, massage or therapeutic touch, animal-assisted therapy and music therapy.  When pain is present, mutually develop an interdisciplinary and multimodal  pain management plan with patient and family/caregiver; track the patient's response to the pain management plan.  Initiate individualized nonpharmacologic pain management measures that may include physical rehabilitation, cognitive behavior therapy, guided imagery, massage, distraction, yoga, relaxation or chiropractic manipulation.  Encourage use of local anesthetic or analgesic therapy as an adjunct for pain control such as lidocaine  patch, capsaicin cream or topical nonsteroidal anti-inflammatory agent.  Prepare patient for use of pharmacologic therapy in a stepped approach that may include acetaminophen , nonsteroidal anti-inflammatory, opioid, antiepileptic or antidepressant.  Review efficacy, tolerability and adherence of pharmacologic therapy; manage medication-induced side effects.   Notes:       Manage My Medicine      Timeframe:  Long-Range Goal Priority:  High Start Date:     03/26/21                        Expected End Date:      08/14/22             Follow Up Date March 2023   - call for medicine refill 2 or 3 days before it runs out - call if I am sick and can't take my medicine - keep a list of all the medicines I take; vitamins and herbals too -Utilize [facility] for medication synchronization, packaging and delivery    Why is this important?   These steps will help you keep on track with your medicines.   Notes:       Patient Stated      Stay as healthy and as independent as possible. Travel, enjoy family and life.      Patient Stated      Limit alcohol consumption      patient states (pt-stated)      Maintain current weight by increasing activity and making  healthy food choices.         This is a list of the screening recommended for you and due dates:  Health Maintenance  Topic Date Due   COVID-19 Vaccine (6 - 2024-25 season) 09/05/2024*   Flu Shot  06/15/2024   Medicare Annual Wellness Visit  04/24/2025   DTaP/Tdap/Td vaccine (3 - Td or Tdap)  09/22/2032   Pneumonia Vaccine  Completed   Zoster (Shingles) Vaccine  Completed   HPV Vaccine  Aged Out   Meningitis B Vaccine  Aged Out  *Topic was postponed. The date shown is not the original due date.

## 2024-04-24 NOTE — Progress Notes (Addendum)
 Subjective:   [name] is a 89 y.o. male who presents for Medicare Annual/Subsequent preventive examination.  Visit Complete: In person Place of Service:[facility] skilled nursing unit Provider: Ulyses Gandy, AGNP-C   Patient Medicare AWV questionnaire was completed by the patient on 04/24/2024; I have confirmed that all information answered by patient is correct and no changes since this date.  Cardiac Risk Factors include: advanced age (>30men, >39 women);sedentary lifestyle;hypertension     Objective:     Today's Vitals   04/24/24 0939  BP: 121/74  Resp: 18  Temp: 98.1 F (36.7 C)  SpO2: 96%  Weight: 181 lb 6.4 oz (82.3 kg)  Height: 6' (1.829 m)   Body mass index is 24.6 kg/m.     04/23/2024   10:45 AM 02/21/2024    2:11 PM 01/16/2024   10:28 AM 01/01/2024    4:29 AM 12/31/2023   11:42 PM 11/01/2023   12:07 PM 09/23/2023    9:36 AM  Advanced Directives  Does Patient Have a Medical Advance Directive? Yes Yes Yes Yes Yes Yes Yes  Type of Advance Directive Living will;Out of facility DNR (pink MOST or yellow form) Out of facility DNR (pink MOST or yellow form) Out of facility DNR (pink MOST or yellow form) Out of facility DNR (pink MOST or yellow form) Out of facility DNR (pink MOST or yellow form) Healthcare Power of Attorney Living will;Out of facility DNR (pink MOST or yellow form)  Does patient want to make changes to medical advance directive? No - Patient declined  No - Patient declined No - Patient declined  No - Patient declined No - Patient declined  Copy of Healthcare Power of Attorney in Chart?      No - copy requested   Pre-existing out of facility DNR order (yellow form or pink MOST form)   Pink MOST/Yellow Form most recent copy in chart - Physician notified to receive inpatient order Pink MOST/Yellow Form most recent copy in chart - Physician notified to receive inpatient order Physician notified to receive inpatient order      Current Medications  (verified) Outpatient Encounter Medications as of 04/24/2024  Medication Sig   acetaminophen  (TYLENOL ) 325 MG tablet Take 2 tablets (650 mg total) by mouth in the morning and at bedtime.   apixaban  (ELIQUIS ) 5 MG TABS tablet Take 1 tablet (5 mg total) by mouth 2 (two) times daily.   camphor-menthol (SARNA) lotion Apply 1 Application topically 2 (two) times daily as needed for itching.   docusate sodium  (COLACE) 100 MG capsule Take 1 capsule (100 mg total) by mouth 2 (two) times daily.   Emollient (CETAPHIL) cream Apply 1 Application topically at bedtime.   escitalopram  (LEXAPRO ) 20 MG tablet Take 20 mg by mouth daily.   Evolocumab  (REPATHA  SURECLICK) 140 MG/ML SOAJ INJECT 1 PEN INTO SKIN EVERY 14 DAYS   ezetimibe  (ZETIA ) 10 MG tablet Take 5 mg by mouth daily. (Patient not taking: Reported on 04/23/2024)   haloperidol  (HALDOL ) 1 MG tablet Take 1 mg by mouth 2 (two) times daily as needed for agitation.   hydrOXYzine  (VISTARIL ) 25 MG capsule Take 25 mg by mouth in the morning and at bedtime.   LORazepam  (ATIVAN ) 0.5 MG tablet Take 1 tablet (0.5 mg total) by mouth 2 (two) times daily. 1 tab, oral, Twice a day Increase Ativan  to 0.5mg  BID   melatonin 5 MG TABS Take 5 mg by mouth at bedtime.   methocarbamol  (ROBAXIN ) 500 MG tablet Take 1 tablet (  500 mg total) by mouth every 6 (six) hours as needed for muscle spasms.   metoprolol  tartrate (LOPRESSOR ) 25 MG tablet Take 25 mg by mouth 2 (two) times daily. Hold for SBP <100   mirtazapine (REMERON SOL-TAB) 15 MG disintegrating tablet Take 15 mg by mouth at bedtime.   Multiple Vitamin (MULTIVITAMIN WITH MINERALS) TABS tablet Take 1 tablet by mouth daily.   nitroGLYCERIN  (NITROSTAT ) 0.4 MG SL tablet Place 0.4 mg under the tongue every 5 (five) minutes as needed for chest pain.   omeprazole  (PRILOSEC) 40 MG capsule Take 40 mg by mouth in the morning. Take on empty stomach 30 minutes before breakfast.   risperiDONE  (RISPERDAL ) 0.5 MG tablet Take 1.5 tablets (0.75  mg total) by mouth every morning.   risperiDONE  (RISPERDAL ) 1 MG tablet Take 1 mg by mouth at bedtime.   rosuvastatin  (CRESTOR ) 5 MG tablet Take 5 mg by mouth every morning.   triamcinolone  cream (KENALOG ) 0.1 % Apply 1 Application topically daily as needed (rash / itching).   [DISCONTINUED] apixaban  (ELIQUIS ) 2.5 MG TABS tablet Take 1 tablet (2.5 mg total) by mouth 2 (two) times daily.   No facility-administered encounter medications on file as of 04/24/2024.    Allergies (verified) Methotrexate, Aricept  [donepezil  hcl], Crestor  [rosuvastatin  calcium ], Lisinopril , Namenda  [memantine ], Atorvastatin , Depakote  [divalproex  sodium], and Pravastatin sodium   History: Past Medical History:  Diagnosis Date   CAD (coronary artery disease)    a. s/p inferior STEMI 01/08/2014; LHC 01/08/14: total RCA occlusion s/p 3.5x53mm Xience DES distal RCA and 3.5x28 mm DES mid RCA, 60-70% mid LAD stenosis, EF 55%.   Complication of anesthesia    'long to wake up after back surgery " 09/2003   Diverticulosis of colon    GERD (gastroesophageal reflux disease)    History of cellulitis    05-26-2015  LLE   History of colon polyps    1998- benign/  2008 adenomatous    History of kidney stones    2013   History of squamous cell carcinoma excision    2013;  2015;  06-12-2015 right leg/  02/ and 05/ 2017  left ear and left leg   Hydronephrosis, left    Hyperlipidemia    Hypertension    Migraine with aura    OSA on CPAP 06/19/2015   Moderate OSA with AHI 18/hr  per study 05-20-2015   Osteoarthritis    Paroxysmal atrial fibrillation (HCC) 4/16   chads2vasc score is at least 4   Premature atrial contractions    RA (rheumatoid arthritis) [facility])    rheumatologist-  dr Kirtland Perfect   Sinusitis, chronic 10/17/2015   Wears glasses    Wears hearing aid    bilateral   Past Surgical History:  Procedure Laterality Date   BACK SURGERY     CARDIOVASCULAR STRESS TEST  11/21/2015   Low risk nuclear study w/ a small  diaphragmatic attenuation artifact, no ischemia/  normal LV function and wall motion , ef 63%   CATARACT EXTRACTION W/ INTRAOCULAR LENS  IMPLANT, BILATERAL  2015   COLONOSCOPY  last one 06-09-2011   CORONARY ANGIOPLASTY     CYSTOSCOPY W/ RETROGRADES Left 07/12/2016   Procedure: CYSTOSCOPY WITH RETROGRADE PYELOGRAM LEFT URETERAL STENT;  Surgeon: Homero Luster, MD;  Location: [facility];  Service: Urology;  Laterality: Left;   CYSTOSCOPY WITH RETROGRADE PYELOGRAM, URETEROSCOPY AND STENT PLACEMENT Left 08/11/2016   Procedure: CYSTOSCOPY WITH RETROGRADE PYELOGRAM,  DIAGNOSTIC URETEROSCOPY , STENT EXCHANGE;  Surgeon: Osborn Blaze, MD;  Location:  [facility];  Service: Urology;  Laterality: Left;   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   severe fibromatosis palm and fingers   LEFT HEART CATHETERIZATION WITH CORONARY ANGIOGRAM N/A 01/08/2014   Procedure: LEFT HEART CATHETERIZATION WITH CORONARY ANGIOGRAM;  Surgeon: Millicent Ally, MD;  Location: [facility] CATH LAB;  Service: Cardiovascular;  Laterality:N/A;  total/ subtotal RCA/  mLAD 60-70% w/ mid systolic bridging/  preserved global LVF, ef 55%   MOHS SURGERY  x2  feb and may 2017   left ear /  left leg  ([facility])   ORIF ACETABULAR FRACTURE Right 03/29/2023   Procedure: OPEN REDUCTION INTERNAL FIXATION (ORIF) ACETABULAR FRACTURE;  Surgeon: Hardy Lia, MD;  Location: [facility];  Service: Orthopedics;  Laterality: Right;   PERCUTANEOUS CORONARY STENT INTERVENTION (PCI-S)  01/08/2014   Procedure: PERCUTANEOUS CORONARY STENT INTERVENTION (PCI-S);  Surgeon: Millicent Ally, MD;  Location: [facility] CATH LAB;  Service: Cardiovascular;;  DES to mid and distal RCA   POSTERIOR LUMBAR FUSION  10/01/2003   and Laminectomy/ diskectomy  L4 -- S1   ROBOT ASSISTED PYELOPLASTY Left 09/15/2016   Procedure: XI ROBOTIC ASSISTED PYELOPLASTY;  Surgeon: Osborn Blaze, MD;  Location: [facility];  Service: Urology;  Laterality: Left;   TONSILLECTOMY     TRANSTHORACIC  ECHOCARDIOGRAM  02/23/2016   mild LVH,  ef 50-55%,  grade 1 diastolic dysfunction/  mild to moderate AV calcification w/ no stenosis or regurg./  trivial MR and TR/  mild PR   Family History  Problem Relation Age of Onset   Hypertension Mother    Heart disease Father    Colon cancer Neg Hx    Social History   Socioeconomic History   Marital status: Married    Spouse name: Not on file   Number of children: 2   Years of education: Not on file   Highest education level: Not on file  Occupational History   Occupation: Retired    Associate Professor: RETIRED  Tobacco Use   Smoking status: Former    Current packs/day: 0.00    Types: Cigarettes    Start date: 08/09/1958    Quit date: 08/09/1968    Years since quitting: 55.7   Smokeless tobacco: Never  Vaping Use   Vaping status: Never Used  Substance and Sexual Activity   Alcohol use: Not Currently    Alcohol/week: 2.0 standard drinks of alcohol    Types: 2 Shots of liquor per week    Comment: daily   Drug use: No   Sexual activity: Yes    Partners: Female  Other Topics Concern   Not on file  Social History Narrative   1 Caffeine drink daily    Right handed    house   Lives with wife   retired      Chief Executive Officer Drivers of Corporate investment banker Strain: Low Risk  (04/24/2024)   Overall Financial Resource Strain (CARDIA)    Difficulty of Paying Living Expenses: Not hard at all  Food Insecurity: No Food Insecurity (04/24/2024)   Hunger Vital Sign    Worried About Running Out of Food in the Last Year: Never true    Ran Out of Food in the Last Year: Never true  Transportation Needs: No Transportation Needs (04/24/2024)   PRAPARE - Administrator, Civil Service (Medical): No    Lack of Transportation (Non-Medical): No  Physical Activity: Inactive (04/24/2024)   Exercise Vital Sign    Days of Exercise per Week: 0  days    Minutes of Exercise per Session: 0 min  Stress: No Stress Concern Present (04/24/2024)   [employer] of Occupational Health - Occupational Stress Questionnaire    Feeling of Stress : Only a little  Social Connections: Socially Isolated (04/24/2024)   Social Connection and Isolation Panel [NHANES]    Frequency of Communication with Friends and Family: Never    Frequency of Social Gatherings with Friends and Family: Twice a week    Attends Religious Services: Never    Database administrator or Organizations: No    Attends Banker Meetings: Never    Marital Status: Widowed    Tobacco Counseling Counseling given: Not Answered   Clinical Intake:  Pre-visit preparation completed: Yes  Pain : No/denies pain     BMI - recorded: 24.6 Nutritional Status: BMI of 19-24  Normal Nutritional Risks: None Diabetes: No  How often do you need to have someone help you when you read instructions, pamphlets, or other written materials from your doctor or pharmacy?: 5 - Always What is the last grade level you completed in school?: college  Interpreter Needed?: No      Activities of Daily Living    04/24/2024   10:12 AM 01/01/2024    4:31 AM  In your present state of health, do you have any difficulty performing the following activities:  Hearing? 1 1  Vision? 1 0  Difficulty concentrating or making decisions? 1 1  Walking or climbing stairs? 1   Dressing or bathing? 1   Doing errands, shopping? 1   Preparing Food and eating ? Y   Using the Toilet? Y   In the past six months, have you accidently leaked urine? Y   Do you have problems with loss of bowel control? Y   Managing your Medications? Y   Managing your Finances? Y   Housekeeping or managing your Housekeeping? Y     Patient Care Team: Marguerite Shiley, MD as PCP - General (Internal Medicine) Jacqueline Matsu, MD as PCP - Cardiology (Cardiology) Jolly Needle, MD (Inactive) as PCP - Electrophysiology (Cardiology) Tobin Forts, MD (Gastroenterology) Jolly Needle, MD (Inactive) (Cardiology) Alanson Alliance, MD as Consulting Physician (Rheumatology) Secundino Dach Harvey Linen., MD as Consulting Physician (Urology) Alvina Axon, MD as Consulting Physician (Ophthalmology) Vernida Goodie (Dentistry) Anselmo Kings, MD as Referring Physician (Allergy and Immunology) Jhonny Moss, MD as Consulting Physician (Neurology) Jonathan Neighbor, [facility] (Inactive) as Pharmacist (Pharmacist) Jhonny Moss, MD as Consulting Physician (Neurology)  Indicate any recent Medical Services you may have received from other than [facility] providers in the past year (date may be approximate).     Assessment:    This is a routine wellness examination for Roshawn.  Hearing/Vision screen No results found.   Goals Addressed             This Visit's Progress    Behavior Symptoms Management   On track    Evidence-based guidance:  Assess for behavior changes by interviewing patient and family/caregiver (informant) using a standardized tool when possible.  Assess for signs/symptoms of underlying condition such as urinary tract infection, unmanaged pain or side effects of pharmacologic therapy when new or worsening agitation appears.  Identify triggers or exacerbating factors such as environmental changes, medication, depression or psychosis.  If behavior is dangerous to patient or family/caregiver and cannot be controlled, consider pharmacologic management, professional in-home care or hospitalization.  Prepare patient and family/caregiver for time-limited pharmacologic  therapy that is prescribed only when behavior symptoms are severe and after careful assessment of the risks, benefits and type of dementia.  Monitor efficacy of pharmacologic therapy that may include antipsychotic for aggression, psychosis and agitation or SSRI (selective serotonin reuptake inhibitor) for mood or depressive symptoms.  When pharmacologic therapy has been tapered, assess symptoms monthly for at least 4 months after discontinuation to identify  recurrence of symptoms.  Use nonpharmacologic approaches to manage behavior including referral to mental health provider for behavior therapy and psychoeducation for patient and caregiver.  Encourage use of complementary therapy directed at behavior and mood management such as aromatherapy, muscle-relaxation training, massage or therapeutic touch, animal-assisted therapy and music therapy.  When pain is present, mutually develop an interdisciplinary and multimodal pain management plan with patient and family/caregiver; track the patient's response to the pain management plan.  Initiate individualized nonpharmacologic pain management measures that may include physical rehabilitation, cognitive behavior therapy, guided imagery, massage, distraction, yoga, relaxation or chiropractic manipulation.  Encourage use of local anesthetic or analgesic therapy as an adjunct for pain control such as lidocaine  patch, capsaicin cream or topical nonsteroidal anti-inflammatory agent.  Prepare patient for use of pharmacologic therapy in a stepped approach that may include acetaminophen , nonsteroidal anti-inflammatory, opioid, antiepileptic or antidepressant.  Review efficacy, tolerability and adherence of pharmacologic therapy; manage medication-induced side effects.   Notes:        Depression Screen    04/24/2024   10:16 AM 04/24/2024   10:15 AM 01/04/2024   12:46 PM 08/18/2023    4:26 PM 08/15/2023   10:15 AM 11/02/2022    9:04 AM 10/12/2022    9:33 AM  PHQ 2/9 Scores  PHQ - 2 Score 0 0  0 0 0 0  Exception Documentation   Medical reason        Fall Risk    04/24/2024   10:15 AM 11/01/2023   12:07 PM 08/18/2023    4:26 PM 08/15/2023   10:15 AM 05/03/2023    4:08 PM  Fall Risk   Falls in the past year? 1 0 1 0 1  Number falls in past yr: 0 0 1 0 1  Injury with Fall? 0 0 1 0 0  Risk for fall due to : History of fall(s);Impaired balance/gait;Impaired mobility  History of fall(s) No Fall Risks   Follow up  Falls evaluation completed;Education provided Falls evaluation completed Falls evaluation completed Falls evaluation completed Falls evaluation completed    MEDICARE RISK AT HOME: Medicare Risk at Home Any stairs in or around the home?: No If so, are there any without handrails?: No Home free of loose throw rugs in walkways, pet beds, electrical cords, etc?: Yes Adequate lighting in your home to reduce risk of falls?: Yes Life alert?: No Use of a cane, walker or w/c?: Yes Grab bars in the bathroom?: Yes Shower chair or bench in shower?: Yes Elevated toilet seat or a handicapped toilet?: Yes  TIMED UP AND GO:  Was the test performed?  No    Cognitive Function:    04/24/2024   10:16 AM 08/18/2023    2:43 PM 11/01/2022    3:00 PM 10/05/2021   11:00 AM 04/03/2021   10:00 AM  MMSE - Mini Mental State Exam  Not completed: Unable to complete      Orientation to time  0 0 2 1  Orientation to Place  3 4 3 4   Registration  3 3 3 3   Attention/ Calculation  4 4  1 4  Recall  0 2 0 0  Language- name 2 objects  2 2 2 2   Language- repeat  1 1 1 1   Language- follow 3 step command  3 3 3 3   Language- read & follow direction  1 1 1 1   Write a sentence  0 1 1 1   Copy design  0 1 0 1  Total score  17 22 17 21       03/10/2020   10:00 AM 04/16/2019   10:00 AM  Montreal Cognitive Assessment   Visuospatial/ Executive (0/5) 1 3  Naming (0/3) 3 3  Attention: Read list of digits (0/2) 2 2  Attention: Read list of letters (0/1) 1 1  Attention: Serial 7 subtraction starting at 100 (0/3) 3 3  Language: Repeat phrase (0/2) 1 1  Language : Fluency (0/1) 1 1  Abstraction (0/2) 1 2  Delayed Recall (0/5) 0 2  Orientation (0/6) 3 6  Total 16 24  Adjusted Score (based on education) 16       06/25/2022    9:46 AM 06/11/2020   10:18 AM  6CIT Screen  What Year? 0 points 0 points  What month? 0 points 0 points  What time? 0 points 0 points  Count back from 20 0 points 0 points  Months in reverse 0  points 0 points  Repeat phrase 0 points 0 points  Total Score 0 points 0 points    Immunizations Immunization History  Administered Date(s) Administered   Fluad Quad(high Dose 65+) 07/09/2019, 08/15/2020, 09/16/2021, 07/29/2022, 09/06/2023   Influenza Split 08/10/2011, 08/15/2012   Influenza Whole 08/21/2009, 09/22/2010   Influenza, High Dose Seasonal PF 09/16/2015, 08/18/2016, 08/31/2017, 08/14/2018   Influenza,inj,Quad PF,6+ Mos 08/06/2013   Influenza-Unspecified 08/31/2017   Moderna Covid-19 Vaccine  Bivalent Booster 1yrs & up 09/06/2023   PFIZER(Purple Top)SARS-COV-2 Vaccination 12/11/2019, 01/01/2020, 08/12/2020   Pfizer Covid-19 Vaccine Bivalent Booster 59yrs & up 03/11/2023   Pneumococcal Conjugate-13 10/02/2013   Pneumococcal Polysaccharide-23 06/04/2010   Tdap 10/22/2011, 09/22/2022   Zoster Recombinant(Shingrix) 03/09/2017, 05/10/2017    TDAP status: Up to date  Flu Vaccine status: Up to date  Pneumococcal vaccine status: Up to date  Covid-19 vaccine status: Completed vaccines  Qualifies for Shingles Vaccine? Yes   Zostavax completed No   Shingrix Completed?: Yes  Screening Tests Health Maintenance  Topic Date Due   COVID-19 Vaccine (6 - 2024-25 season) 09/05/2024 (Originally 11/01/2023)   INFLUENZA VACCINE  06/15/2024   Medicare Annual Wellness (AWV)  04/24/2025   DTaP/Tdap/Td (3 - Td or Tdap) 09/22/2032   Pneumonia Vaccine 71+ Years old  Completed   Zoster Vaccines- Shingrix  Completed   HPV VACCINES  Aged Out   Meningococcal B Vaccine  Aged Out    Health Maintenance  There are no preventive care reminders to display for this patient.  Colorectal cancer screening: No longer required.   Lung Cancer Screening: (Low Dose CT Chest recommended if Age 55-80 years, 20 pack-year currently smoking OR have quit w/in 15years.) does not qualify.   Lung Cancer Screening Referral: No  Additional Screening:  Hepatitis C Screening: does not qualify; Completed    Vision Screening: Recommended annual ophthalmology exams for early detection of glaucoma and other disorders of the eye. Is the patient up to date with their annual eye exam?  No  Who is the provider or what is the name of the office in which the patient attends annual eye exams? In house provider if needed If pt  is not established with a provider, would they like to be referred to a provider to establish care? No .   Dental Screening: Recommended annual dental exams for proper oral hygiene  Diabetic Foot Exam: Diabetic Foot Exam: Completed 04/24/2024  Community Resource Referral / Chronic Care Management: CRR required this visit?  No   CCM required this visit?  No     Plan:     I have personally reviewed and noted the following in the patient's chart:   Medical and social history Use of alcohol, tobacco or illicit drugs  Current medications and supplements including opioid prescriptions. Patient is not currently taking opioid prescriptions. Functional ability and status Nutritional status Physical activity Advanced directives List of other physicians Hospitalizations, surgeries, and ER visits in previous 12 months Vitals Screenings to include cognitive, depression, and falls Referrals and appointments  In addition, I have reviewed and discussed with patient certain preventive protocols, quality metrics, and best practice recommendations. A written personalized care plan for preventive services as well as general preventive health recommendations were provided to patient.     Arnetha Bhat, NP   04/24/2024   After Visit Summary: (MyChart) Due to this being a telephonic visit, the after visit summary with patients personalized plan was offered to patient via MyChart   Nurse Notes: Lives in skilled nursing due to Alzheimer's dementia. Questions answered with help of nurse. Last MMSE 22/30 per [facility]. Ambulates with juditta. Very supportive family.

## 2024-04-25 ENCOUNTER — Encounter: Payer: Self-pay | Admitting: Adult Health

## 2024-04-25 NOTE — Assessment & Plan Note (Signed)
 On Eliquis  for CVA risk reduction  Regular on exam Rate controlled with metoprolol 

## 2024-04-25 NOTE — Assessment & Plan Note (Signed)
 Controlled.

## 2024-04-25 NOTE — Assessment & Plan Note (Signed)
 No signs of depression On lexapro  and remeron

## 2024-04-25 NOTE — Assessment & Plan Note (Signed)
 On statin, zetia , repatha  No current issues.

## 2024-04-25 NOTE — Assessment & Plan Note (Signed)
 No issues with pain Off orencia  Only using tylenol 

## 2024-04-25 NOTE — Assessment & Plan Note (Signed)
 Not on aricept  or namenda  due to reaction Followed by Dr Levie Ream for behaviors.  Continues with some aggression but improved Severe stage Continue Risperdal  and prn haldol 

## 2024-05-03 DIAGNOSIS — F4323 Adjustment disorder with mixed anxiety and depressed mood: Secondary | ICD-10-CM | POA: Diagnosis not present

## 2024-05-25 ENCOUNTER — Other Ambulatory Visit: Payer: Self-pay | Admitting: Adult Health

## 2024-05-25 MED ORDER — LORAZEPAM 0.5 MG PO TABS: 0.5000 mg | ORAL_TABLET | Freq: Two times a day (BID) | ORAL | 3 refills | Status: DC

## 2024-05-28 ENCOUNTER — Encounter: Payer: Self-pay | Admitting: Internal Medicine

## 2024-05-28 ENCOUNTER — Non-Acute Institutional Stay (SKILLED_NURSING_FACILITY): Payer: Self-pay | Admitting: Internal Medicine

## 2024-05-28 DIAGNOSIS — I480 Paroxysmal atrial fibrillation: Secondary | ICD-10-CM

## 2024-05-28 DIAGNOSIS — M069 Rheumatoid arthritis, unspecified: Secondary | ICD-10-CM | POA: Diagnosis not present

## 2024-05-28 DIAGNOSIS — E782 Mixed hyperlipidemia: Secondary | ICD-10-CM

## 2024-05-28 DIAGNOSIS — I10 Essential (primary) hypertension: Secondary | ICD-10-CM

## 2024-05-28 DIAGNOSIS — I2510 Atherosclerotic heart disease of native coronary artery without angina pectoris: Secondary | ICD-10-CM | POA: Diagnosis not present

## 2024-05-28 DIAGNOSIS — F02C0 Dementia in other diseases classified elsewhere, severe, without behavioral disturbance, psychotic disturbance, mood disturbance, and anxiety: Secondary | ICD-10-CM

## 2024-05-28 DIAGNOSIS — G308 Other Alzheimer's disease: Secondary | ICD-10-CM

## 2024-05-28 DIAGNOSIS — F4321 Adjustment disorder with depressed mood: Secondary | ICD-10-CM | POA: Diagnosis not present

## 2024-05-28 NOTE — Progress Notes (Signed)
 Location:  [facility] Room Number: 124 P Place of Service:  SNF [phone]) Provider:  Charlanne Fredia CROME, MD    Patient Care Team: Charlanne Fredia CROME, MD as PCP - General (Internal Medicine) Shlomo Wilbert SAUNDERS, MD as PCP - Cardiology (Cardiology) Kelsie Agent, MD (Inactive) as PCP - Electrophysiology (Cardiology) Abran Norleen SAILOR, MD (Gastroenterology) Kelsie Agent, MD (Inactive) (Cardiology) Mai Agent FALCON, MD as Consulting Physician (Rheumatology) Alvaro Ricardo KATHEE Raddle., MD as Consulting Physician (Urology) Charmayne Molly, MD as Consulting Physician (Ophthalmology) Carolee Clap (Dentistry) Cheryn Nickels, MD as Referring Physician (Allergy and Immunology) Georjean Darice HERO, MD as Consulting Physician (Neurology) Fate Morna SAILOR, [facility] (Inactive) as Pharmacist (Pharmacist) Georjean Darice HERO, MD as Consulting Physician (Neurology)  Extended Emergency Contact Information Primary Emergency Contact: Robleto,[name] Address: [address]          [place], [place] Home Phone: [phone] Work Phone: [phone] Mobile Phone: [phone] Relation: Spouse Secondary Emergency Contact: [name],[name] Home Phone: [phone] Mobile Phone: [phone] Relation: Daughter  Code Status:  DNR Goals of care: Advanced Directive information    04/23/2024   10:45 AM  Advanced Directives  Does Patient Have a Medical Advance Directive? Yes  Type of Advance Directive Living will;Out of facility DNR (pink MOST or yellow form)  Does patient want to make changes to medical advance directive? No - Patient declined     Chief Complaint  Patient presents with   Routine Visit    HPI:  Pt is a 89 y.o. male seen today for medical management of chronic diseases.   Lives in SNF in [place]  Patient has h/o Dementia with behavior issues, Rheumatoid Arthritis, A Fib, HTN, CAD s/p PCI, GERD and Sleep apnea And Depression    Is stable on his behaviors   No Nursing issues Deitra dependent No falls Respond and follow commands Has lost weight Wt Readings from Last 3 Encounters:  05/28/24 173 lb 3.2 oz (78.6 kg)  04/24/24 181 lb 6.4 oz (82.3 kg)  04/23/24 181 lb 6.4 oz (82.3 kg)     Past Medical History:  Diagnosis Date   CAD (coronary artery disease)    a. s/p inferior STEMI 01/08/2014; LHC 01/08/14: total RCA occlusion s/p 3.5x65mm Xience DES distal RCA and 3.5x28 mm DES mid RCA, 60-70% mid LAD stenosis, EF 55%.   Complication of anesthesia    'long to wake up after back surgery  09/2003   Diverticulosis of colon    GERD (gastroesophageal reflux disease)    History of cellulitis    05-26-2015  LLE   History of colon polyps    1998- benign/  2008 adenomatous    History of kidney stones    2013   History of squamous cell carcinoma excision    2013;  2015;  06-12-2015 right leg/  02/ and 05/ 2017  left ear and left leg   Hydronephrosis, left    Hyperlipidemia    Hypertension    Migraine with aura    OSA on CPAP 06/19/2015   Moderate OSA with AHI 18/hr  per study 05-20-2015   Osteoarthritis    Paroxysmal atrial fibrillation (HCC) 4/16   chads2vasc score is at least 4   Premature atrial contractions    RA (rheumatoid arthritis) [facility])    rheumatologist-  dr agent mai   Sinusitis, chronic 10/17/2015   Wears glasses    Wears hearing aid    bilateral   Past Surgical History:  Procedure Laterality  Date   BACK SURGERY     CARDIOVASCULAR STRESS TEST  11/21/2015   Low risk nuclear study w/ a small diaphragmatic attenuation artifact, no ischemia/  normal LV function and wall motion , ef 63%   CATARACT EXTRACTION W/ INTRAOCULAR LENS  IMPLANT, BILATERAL  2015   COLONOSCOPY  last one 06-09-2011   CORONARY ANGIOPLASTY     CYSTOSCOPY W/ RETROGRADES Left 07/12/2016   Procedure: CYSTOSCOPY WITH RETROGRADE PYELOGRAM LEFT URETERAL STENT;  Surgeon: Norleen Seltzer, MD;  Location: [facility];  Service: Urology;  Laterality: Left;   CYSTOSCOPY  WITH RETROGRADE PYELOGRAM, URETEROSCOPY AND STENT PLACEMENT Left 08/11/2016   Procedure: CYSTOSCOPY WITH RETROGRADE PYELOGRAM,  DIAGNOSTIC URETEROSCOPY , STENT EXCHANGE;  Surgeon: Ricardo Likens, MD;  Location: [facility];  Service: Urology;  Laterality: Left;   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   severe fibromatosis palm and fingers   LEFT HEART CATHETERIZATION WITH CORONARY ANGIOGRAM N/A 01/08/2014   Procedure: LEFT HEART CATHETERIZATION WITH CORONARY ANGIOGRAM;  Surgeon: Debby DELENA Sor, MD;  Location: [facility] CATH LAB;  Service: Cardiovascular;  Laterality:N/A;  total/ subtotal RCA/  mLAD 60-70% w/ mid systolic bridging/  preserved global LVF, ef 55%   MOHS SURGERY  x2  feb and may 2017   left ear /  left leg  (S[facility])   ORIF ACETABULAR FRACTURE Right 03/29/2023   Procedure: OPEN REDUCTION INTERNAL FIXATION (ORIF) ACETABULAR FRACTURE;  Surgeon: Celena Sharper, MD;  Location: [facility] OR;  Service: Orthopedics;  Laterality: Right;   PERCUTANEOUS CORONARY STENT INTERVENTION (PCI-S)  01/08/2014   Procedure: PERCUTANEOUS CORONARY STENT INTERVENTION (PCI-S);  Surgeon: Debby DELENA Sor, MD;  Location: [facility] CATH LAB;  Service: Cardiovascular;;  DES to mid and distal RCA   POSTERIOR LUMBAR FUSION  10/01/2003   and Laminectomy/ diskectomy  L4 -- S1   ROBOT ASSISTED PYELOPLASTY Left 09/15/2016   Procedure: XI ROBOTIC ASSISTED PYELOPLASTY;  Surgeon: Ricardo Likens, MD;  Location: [facility];  Service: Urology;  Laterality: Left;   TONSILLECTOMY     TRANSTHORACIC ECHOCARDIOGRAM  02/23/2016   mild LVH,  ef 50-55%,  grade 1 diastolic dysfunction/  mild to moderate AV calcification w/ no stenosis or regurg./  trivial MR and TR/  mild PR    Allergies  Allergen Reactions   Methotrexate Other (See Comments)    REACTION: tachycardia   Aricept  [Donepezil  Hcl] Other (See Comments)    Nightmares. Pt takes this medication per [facility]    Crestor  [Rosuvastatin  Calcium ] Other (See Comments)    Myalgias with 20mg   dose   Lisinopril  Other (See Comments)    cough   Namenda  [Memantine ] Diarrhea and Nausea Only   Atorvastatin  Other (See Comments)    Muscle pain, leg cramps   Depakote  [Divalproex  Sodium] Rash    Allergy not listed on MAR    Pravastatin Sodium Other (See Comments)    REACTION: cramps, fatigue    Outpatient Encounter Medications as of 05/28/2024  Medication Sig   acetaminophen  (TYLENOL ) 325 MG tablet Take 2 tablets (650 mg total) by mouth in the morning and at bedtime.   apixaban  (ELIQUIS ) 5 MG TABS tablet Take 1 tablet (5 mg total) by mouth 2 (two) times daily.   camphor-menthol (SARNA) lotion Apply 1 Application topically 2 (two) times daily as needed for itching.   docusate sodium  (COLACE) 100 MG capsule Take 1 capsule (100 mg total) by mouth 2 (two) times daily.   Emollient (CETAPHIL) cream Apply 1 Application topically at bedtime.   escitalopram  (LEXAPRO ) 20  MG tablet Take 20 mg by mouth daily.   Evolocumab  (REPATHA  SURECLICK) 140 MG/ML SOAJ INJECT 1 PEN INTO SKIN EVERY 14 DAYS   ezetimibe  (ZETIA ) 10 MG tablet Take 0.5 mg by mouth daily.   haloperidol  (HALDOL ) 1 MG tablet Take 1 mg by mouth 2 (two) times daily as needed for agitation.   hydrOXYzine  (VISTARIL ) 25 MG capsule Take 25 mg by mouth in the morning and at bedtime.   LORazepam  (ATIVAN ) 0.5 MG tablet Take 1 tablet (0.5 mg total) by mouth 2 (two) times daily. 1 tab, oral, Twice a day Increase Ativan  to 0.5mg  BID   melatonin 5 MG TABS Take 5 mg by mouth at bedtime.   methocarbamol  (ROBAXIN ) 500 MG tablet Take 1 tablet (500 mg total) by mouth every 6 (six) hours as needed for muscle spasms.   metoprolol  tartrate (LOPRESSOR ) 25 MG tablet Take 25 mg by mouth 2 (two) times daily. Hold for SBP <100   mirtazapine (REMERON SOL-TAB) 15 MG disintegrating tablet Take 15 mg by mouth at bedtime.   Multiple Vitamin (MULTIVITAMIN WITH MINERALS) TABS tablet Take 1 tablet by mouth daily.   nitroGLYCERIN  (NITROSTAT ) 0.4 MG SL tablet Place 0.4  mg under the tongue every 5 (five) minutes as needed for chest pain.   omeprazole  (PRILOSEC) 40 MG capsule Take 40 mg by mouth in the morning. Take on empty stomach 30 minutes before breakfast.   risperiDONE  (RISPERDAL ) 0.5 MG tablet Take 1.5 tablets (0.75 mg total) by mouth every morning.   risperiDONE  (RISPERDAL ) 1 MG tablet Take 1 mg by mouth at bedtime.   rosuvastatin  (CRESTOR ) 5 MG tablet Take 5 mg by mouth every morning.   triamcinolone  cream (KENALOG ) 0.1 % Apply 1 Application topically daily as needed (rash / itching).   [DISCONTINUED] apixaban  (ELIQUIS ) 2.5 MG TABS tablet Take 1 tablet (2.5 mg total) by mouth 2 (two) times daily.   No facility-administered encounter medications on file as of 05/28/2024.    Review of Systems  Unable to perform ROS: Dementia    Immunization History  Administered Date(s) Administered   Fluad Quad(high Dose 65+) 07/09/2019, 08/15/2020, 09/16/2021, 07/29/2022, 09/06/2023   Influenza Split 08/10/2011, 08/15/2012   Influenza Whole 08/21/2009, 09/22/2010   Influenza, High Dose Seasonal PF 09/16/2015, 08/18/2016, 08/31/2017, 08/14/2018   Influenza,inj,Quad PF,6+ Mos 08/06/2013   Influenza-Unspecified 08/31/2017   Moderna Covid-19 Vaccine  Bivalent Booster 51yrs & up 09/06/2023   PFIZER(Purple Top)SARS-COV-2 Vaccination 12/11/2019, 01/01/2020, 08/12/2020   Pfizer Covid-19 Vaccine Bivalent Booster 50yrs & up 03/11/2023   Pneumococcal Conjugate-13 10/02/2013   Pneumococcal Polysaccharide-23 06/04/2010   Tdap 10/22/2011, 09/22/2022   Zoster Recombinant(Shingrix) 03/09/2017, 05/10/2017   Pertinent  Health Maintenance Due  Topic Date Due   INFLUENZA VACCINE  06/15/2024      05/03/2023    4:08 PM 08/15/2023   10:15 AM 08/18/2023    4:26 PM 11/01/2023   12:07 PM 04/24/2024   10:15 AM  Fall Risk  Falls in the past year? 1 0 1 0 1  Was there an injury with Fall? 0 0 1 0 0  Fall Risk Category Calculator 2 0 3 0 1  Patient at Risk for Falls Due to  No  Fall Risks History of fall(s)  History of fall(s);Impaired balance/gait;Impaired mobility  Fall risk Follow up Falls evaluation completed Falls evaluation completed Falls evaluation completed Falls evaluation completed Falls evaluation completed;Education provided   Functional Status Survey:    Vitals:   05/28/24 1155  BP: (!) 153/92  Pulse: 66  Resp: 18  Temp: (!) 97.5 F (36.4 C)  SpO2: 97%  Weight: 173 lb 3.2 oz (78.6 kg)  Height: 6' (1.829 m)   Body mass index is 23.49 kg/m. Physical Exam Vitals reviewed.  Constitutional:      Appearance: Normal appearance.  HENT:     Head: Normocephalic.     Nose: Nose normal.     Mouth/Throat:     Mouth: Mucous membranes are moist.     Pharynx: Oropharynx is clear.  Eyes:     Pupils: Pupils are equal, round, and reactive to light.  Cardiovascular:     Rate and Rhythm: Normal rate and regular rhythm.     Pulses: Normal pulses.     Heart sounds: No murmur heard. Pulmonary:     Effort: Pulmonary effort is normal. No respiratory distress.     Breath sounds: Normal breath sounds. No rales.  Abdominal:     General: Abdomen is flat. Bowel sounds are normal.     Palpations: Abdomen is soft.  Musculoskeletal:        General: No swelling.     Cervical back: Neck supple.     Comments: Swelling in Right LE  Skin:    General: Skin is warm.  Neurological:     General: No focal deficit present.     Mental Status: He is alert.  Psychiatric:        Mood and Affect: Mood normal.        Thought Content: Thought content normal.     Labs reviewed: Recent Labs    01/02/24 0557 01/05/24 1805 01/06/24 0000 02/21/24 1452 04/02/24 0000  NA 135 139 141 138 139  K 3.4* 3.6 3.5 3.6 4.1  CL 105 107 107 106 106  CO2 20* 24 21 24 21   GLUCOSE 112* 96  --  81  --   BUN 21 15 13 16 19   CREATININE 0.76 0.88 0.7 0.89 0.8  CALCIUM  8.4* 8.5* 8.5* 8.7* 8.5*  MG 1.9  --   --   --   --    Recent Labs    01/01/24 0004 01/02/24 0557  02/21/24 1452 04/02/24 0000  AST 24 35 28 17  ALT 29 33 31 24  ALKPHOS 55 51 60 81  BILITOT 0.5 0.5 0.6  --   PROT 6.6 6.6 6.7  --   ALBUMIN  3.2* 3.0* 3.5 3.4*   Recent Labs    01/01/24 0004 01/02/24 0557 01/05/24 1805 02/21/24 1435 04/02/24 0000  WBC 6.1 4.2 4.1 7.7 6.8  NEUTROABS 5.0  --  2.0 4.4  --   HGB 11.0* 10.7* 11.7* 12.0* 11.5*  HCT 32.8* 32.2* 34.6* 36.3* 34*  MCV 91.9 92.8 90.8 93.6  --   PLT 227 196 229 283 298   Lab Results  Component Value Date   TSH 1.27 04/02/2024   Lab Results  Component Value Date   HGBA1C 6.4 08/30/2017   Lab Results  Component Value Date   CHOL 126 01/26/2023   HDL 36 01/26/2023   LDLCALC 39 01/26/2023   LDLDIRECT 168.9 05/24/2013   TRIG 254 (A) 01/26/2023   CHOLHDL 4.2 08/04/2020    Significant Diagnostic Results in last 30 days:  No results found.  Assessment/Plan 1. Paroxysmal atrial fibrillation (HCC) (Primary) Eliquis  and Metoprolol   2. Weight loss He is on Regular diet If continues with Weight loss will discuss with Family  3. Coronary artery disease involving native coronary artery of native heart without angina pectoris On  Eliquis  and Repatha  and Metoprolol   4. Severe Alzheimer's dementia of other onset without behavioral disturbance, psychotic disturbance, mood disturbance, or anxiety (HCC) On Risperdal  Behaviors more controlled Did not tolerate Namenda  or Depakote   5. Rheumatoid arthritis involving elbow, unspecified laterality, unspecified whether rheumatoid factor present (HCC) Off All med No active symptoms  6. Situational depression Lexapro  and Remeron and Ativan   7. Mixed hyperlipidemia Repatha  and statin 8 GERD Prilosec   Family/ staff Communication:   Labs/tests ordered:  Lipid Panel

## 2024-05-29 DIAGNOSIS — I2510 Atherosclerotic heart disease of native coronary artery without angina pectoris: Secondary | ICD-10-CM | POA: Diagnosis not present

## 2024-05-29 LAB — LIPID PANEL
Cholesterol: 82 (ref 0–200)
HDL: 41 (ref 35–70)
LDL Cholesterol: 14
LDl/HDL Ratio: 2
Triglycerides: 130 (ref 40–160)

## 2024-06-08 ENCOUNTER — Encounter: Payer: Self-pay | Admitting: Adult Health

## 2024-06-08 ENCOUNTER — Non-Acute Institutional Stay (SKILLED_NURSING_FACILITY): Admitting: Adult Health

## 2024-06-08 DIAGNOSIS — H00022 Hordeolum internum right lower eyelid: Secondary | ICD-10-CM | POA: Diagnosis not present

## 2024-06-08 MED ORDER — ERYTHROMYCIN 5 MG/GM OP OINT: 1.0000 | TOPICAL_OINTMENT | Freq: Four times a day (QID) | OPHTHALMIC | Status: AC

## 2024-06-08 NOTE — Progress Notes (Signed)
 Location:  [facility] Room Number: /124/A Place of Service:  SNF ([phone]) Provider:  Darlean Maus, NP    Patient Care Team: Charlanne Fredia CROME, MD as PCP - General (Internal Medicine) Shlomo Wilbert SAUNDERS, MD as PCP - Cardiology (Cardiology) Kelsie Agent, MD (Inactive) as PCP - Electrophysiology (Cardiology) Abran Norleen SAILOR, MD (Gastroenterology) Kelsie Agent, MD (Inactive) (Cardiology) Mai Agent FALCON, MD as Consulting Physician (Rheumatology) Alvaro Ricardo KATHEE Raddle., MD as Consulting Physician (Urology) Charmayne Molly, MD as Consulting Physician (Ophthalmology) Carolee Clap (Dentistry) Cheryn Nickels, MD as Referring Physician (Allergy and Immunology) Georjean Darice HERO, MD as Consulting Physician (Neurology) Fate Morna SAILOR, [facility] (Inactive) as Pharmacist (Pharmacist) Georjean Darice HERO, MD as Consulting Physician (Neurology)  Extended Emergency Contact Information Primary Emergency Contact: Smolinsky,[name] Address: [address]          [place], [place] Home Phone: [phone] Work Phone: [phone] Mobile Phone: [phone] Relation: Spouse Secondary Emergency Contact: [name],[name] Home Phone: [phone] Mobile Phone: [phone] Relation: Daughter  Code Status:  DNR Goals of care: Advanced Directive information    06/08/2024    8:54 AM  Advanced Directives  Does Patient Have a Medical Advance Directive? Yes  Type of Advance Directive Living will;Out of facility DNR (pink MOST or yellow form)  Does patient want to make changes to medical advance directive? No - Patient declined  Pre-existing out of facility DNR order (yellow form or pink MOST form) Yellow form placed in chart (order not valid for inpatient use)     Chief Complaint  Patient presents with   Eye Problem    eye redness     HPI:  Pt is a 89 y.o. male seen today for an acute visit for right eye redness  Staff reports some redness to the  right sclera, conjunctiva, and mild lower lid swelling.  No purulent matter No cough or fever No vision change or eye pain    Past Medical History:  Diagnosis Date   CAD (coronary artery disease)    a. s/p inferior STEMI 01/08/2014; LHC 01/08/14: total RCA occlusion s/p 3.5x17mm Xience DES distal RCA and 3.5x28 mm DES mid RCA, 60-70% mid LAD stenosis, EF 55%.   Complication of anesthesia    'long to wake up after back surgery  09/2003   Diverticulosis of colon    GERD (gastroesophageal reflux disease)    History of cellulitis    05-26-2015  LLE   History of colon polyps    1998- benign/  2008 adenomatous    History of kidney stones    2013   History of squamous cell carcinoma excision    2013;  2015;  06-12-2015 right leg/  02/ and 05/ 2017  left ear and left leg   Hydronephrosis, left    Hyperlipidemia    Hypertension    Migraine with aura    OSA on CPAP 06/19/2015   Moderate OSA with AHI 18/hr  per study 05-20-2015   Osteoarthritis    Paroxysmal atrial fibrillation (HCC) 4/16   chads2vasc score is at least 4   Premature atrial contractions    RA (rheumatoid arthritis) [facility])    rheumatologist-  dr agent mai   Sinusitis, chronic 10/17/2015   Wears glasses    Wears hearing aid    bilateral   Past Surgical History:  Procedure Laterality Date   BACK SURGERY     CARDIOVASCULAR STRESS TEST  11/21/2015   Low risk nuclear study w/ a small  diaphragmatic attenuation artifact, no ischemia/  normal LV function and wall motion , ef 63%   CATARACT EXTRACTION W/ INTRAOCULAR LENS  IMPLANT, BILATERAL  2015   COLONOSCOPY  last one 06-09-2011   CORONARY ANGIOPLASTY     CYSTOSCOPY W/ RETROGRADES Left 07/12/2016   Procedure: CYSTOSCOPY WITH RETROGRADE PYELOGRAM LEFT URETERAL STENT;  Surgeon: Norleen Seltzer, MD;  Location: [facility];  Service: Urology;  Laterality: Left;   CYSTOSCOPY WITH RETROGRADE PYELOGRAM, URETEROSCOPY AND STENT PLACEMENT Left 08/11/2016   Procedure: CYSTOSCOPY WITH  RETROGRADE PYELOGRAM,  DIAGNOSTIC URETEROSCOPY , STENT EXCHANGE;  Surgeon: Ricardo Likens, MD;  Location: [facility];  Service: Urology;  Laterality: Left;   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   severe fibromatosis palm and fingers   LEFT HEART CATHETERIZATION WITH CORONARY ANGIOGRAM N/A 01/08/2014   Procedure: LEFT HEART CATHETERIZATION WITH CORONARY ANGIOGRAM;  Surgeon: Debby DELENA Sor, MD;  Location: [facility] CATH LAB;  Service: Cardiovascular;  Laterality:N/A;  total/ subtotal RCA/  mLAD 60-70% w/ mid systolic bridging/  preserved global LVF, ef 55%   MOHS SURGERY  x2  feb and may 2017   left ear /  left leg  (S[facility])   ORIF ACETABULAR FRACTURE Right 03/29/2023   Procedure: OPEN REDUCTION INTERNAL FIXATION (ORIF) ACETABULAR FRACTURE;  Surgeon: Celena Sharper, MD;  Location: [facility] OR;  Service: Orthopedics;  Laterality: Right;   PERCUTANEOUS CORONARY STENT INTERVENTION (PCI-S)  01/08/2014   Procedure: PERCUTANEOUS CORONARY STENT INTERVENTION (PCI-S);  Surgeon: Debby DELENA Sor, MD;  Location: [facility] CATH LAB;  Service: Cardiovascular;;  DES to mid and distal RCA   POSTERIOR LUMBAR FUSION  10/01/2003   and Laminectomy/ diskectomy  L4 -- S1   ROBOT ASSISTED PYELOPLASTY Left 09/15/2016   Procedure: XI ROBOTIC ASSISTED PYELOPLASTY;  Surgeon: Ricardo Likens, MD;  Location: [facility];  Service: Urology;  Laterality: Left;   TONSILLECTOMY     TRANSTHORACIC ECHOCARDIOGRAM  02/23/2016   mild LVH,  ef 50-55%,  grade 1 diastolic dysfunction/  mild to moderate AV calcification w/ no stenosis or regurg./  trivial MR and TR/  mild PR    Allergies  Allergen Reactions   Methotrexate Other (See Comments)    REACTION: tachycardia   Aricept  [Donepezil  Hcl] Other (See Comments)    Nightmares. Pt takes this medication per [facility]    Crestor  [Rosuvastatin  Calcium ] Other (See Comments)    Myalgias with 20mg  dose   Lisinopril  Other (See Comments)    cough   Namenda  [Memantine ] Diarrhea and Nausea Only    Atorvastatin  Other (See Comments)    Muscle pain, leg cramps   Depakote  [Divalproex  Sodium] Rash    Allergy not listed on MAR    Pravastatin Sodium Other (See Comments)    REACTION: cramps, fatigue    Outpatient Encounter Medications as of 06/08/2024  Medication Sig   acetaminophen  (TYLENOL ) 325 MG tablet Take 2 tablets (650 mg total) by mouth in the morning and at bedtime.   apixaban  (ELIQUIS ) 5 MG TABS tablet Take 1 tablet (5 mg total) by mouth 2 (two) times daily.   camphor-menthol (SARNA) lotion Apply 1 Application topically 2 (two) times daily as needed for itching.   docusate sodium  (COLACE) 100 MG capsule Take 1 capsule (100 mg total) by mouth 2 (two) times daily.   Emollient (CETAPHIL) cream Apply 1 Application topically at bedtime.   escitalopram  (LEXAPRO ) 20 MG tablet Take 20 mg by mouth daily.   Evolocumab  (REPATHA  SURECLICK) 140 MG/ML SOAJ INJECT 1 PEN INTO SKIN EVERY 14  DAYS   ezetimibe  (ZETIA ) 10 MG tablet Take 0.5 mg by mouth daily.   haloperidol  (HALDOL ) 1 MG tablet Take 1 mg by mouth 2 (two) times daily as needed for agitation.   hydrOXYzine  (VISTARIL ) 25 MG capsule Take 25 mg by mouth in the morning and at bedtime.   LORazepam  (ATIVAN ) 0.5 MG tablet Take 1 tablet (0.5 mg total) by mouth 2 (two) times daily. 1 tab, oral, Twice a day Increase Ativan  to 0.5mg  BID   melatonin 5 MG TABS Take 5 mg by mouth at bedtime.   methocarbamol  (ROBAXIN ) 500 MG tablet Take 1 tablet (500 mg total) by mouth every 6 (six) hours as needed for muscle spasms.   metoprolol  tartrate (LOPRESSOR ) 25 MG tablet Take 25 mg by mouth 2 (two) times daily. Hold for SBP <100   mirtazapine (REMERON SOL-TAB) 15 MG disintegrating tablet Take 15 mg by mouth at bedtime.   Multiple Vitamin (MULTIVITAMIN WITH MINERALS) TABS tablet Take 1 tablet by mouth daily.   nitroGLYCERIN  (NITROSTAT ) 0.4 MG SL tablet Place 0.4 mg under the tongue every 5 (five) minutes as needed for chest pain.   omeprazole  (PRILOSEC) 40 MG  capsule Take 40 mg by mouth in the morning. Take on empty stomach 30 minutes before breakfast.   risperiDONE  (RISPERDAL ) 0.5 MG tablet Take 1.5 tablets (0.75 mg total) by mouth every morning.   risperiDONE  (RISPERDAL ) 1 MG tablet Take 1 mg by mouth at bedtime.   rosuvastatin  (CRESTOR ) 5 MG tablet Take 5 mg by mouth every morning.   triamcinolone  cream (KENALOG ) 0.1 % Apply 1 Application topically daily as needed (rash / itching).   No facility-administered encounter medications on file as of 06/08/2024.    Review of Systems  Constitutional:  Negative for activity change, appetite change, chills, diaphoresis, fatigue, fever and unexpected weight change.  Eyes:  Positive for redness. Negative for photophobia, pain, discharge, itching and visual disturbance.    Immunization History  Administered Date(s) Administered   Fluad Quad(high Dose 65+) 07/09/2019, 08/15/2020, 09/16/2021, 07/29/2022, 09/06/2023   Influenza Split 08/10/2011, 08/15/2012   Influenza Whole 08/21/2009, 09/22/2010   Influenza, High Dose Seasonal PF 09/16/2015, 08/18/2016, 08/31/2017, 08/14/2018   Influenza,inj,Quad PF,6+ Mos 08/06/2013   Influenza-Unspecified 08/31/2017   Moderna Covid-19 Vaccine  Bivalent Booster 58yrs & up 09/06/2023   PFIZER(Purple Top)SARS-COV-2 Vaccination 12/11/2019, 01/01/2020, 08/12/2020   Pfizer Covid-19 Vaccine Bivalent Booster 58yrs & up 03/11/2023   Pneumococcal Conjugate-13 10/02/2013   Pneumococcal Polysaccharide-23 06/04/2010   Tdap 10/22/2011, 09/22/2022   Zoster Recombinant(Shingrix) 03/09/2017, 05/10/2017   Pertinent  Health Maintenance Due  Topic Date Due   INFLUENZA VACCINE  06/15/2024      05/03/2023    4:08 PM 08/15/2023   10:15 AM 08/18/2023    4:26 PM 11/01/2023   12:07 PM 04/24/2024   10:15 AM  Fall Risk  Falls in the past year? 1 0 1 0 1  Was there an injury with Fall? 0 0 1 0 0  Fall Risk Category Calculator 2 0 3 0 1  Patient at Risk for Falls Due to  No Fall Risks  History of fall(s)  History of fall(s);Impaired balance/gait;Impaired mobility  Fall risk Follow up Falls evaluation completed Falls evaluation completed Falls evaluation completed Falls evaluation completed Falls evaluation completed;Education provided   Functional Status Survey:    Vitals:   06/08/24 0858  BP: 123/64  Pulse: 64  Resp: 18  Temp: (!) 97.5 F (36.4 C)  SpO2: 97%  Weight: 181 lb 6.4 oz (82.3  kg)  Height: 6' (1.829 m)   Body mass index is 24.6 kg/m. Physical Exam Vitals and nursing note reviewed.  Constitutional:      Appearance: Normal appearance.  Eyes:     General:        Right eye: Hordeolum present. No foreign body or discharge.        Left eye: No foreign body, discharge or hordeolum.     Extraocular Movements:     Right eye: Normal extraocular motion.     Left eye: Normal extraocular motion.     Conjunctiva/sclera:     Right eye: Right conjunctiva is injected. No chemosis, exudate or hemorrhage.    Left eye: Left conjunctiva is not injected. No chemosis, exudate or hemorrhage.    Comments: Erythema to the sclera and conjunctiva on the right Inner lower lid hordeolum on the right.  Pus filled   Neurological:     Mental Status: He is alert.     Labs reviewed: Recent Labs    01/02/24 0557 01/05/24 1805 01/06/24 0000 02/21/24 1452 04/02/24 0000  NA 135 139 141 138 139  K 3.4* 3.6 3.5 3.6 4.1  CL 105 107 107 106 106  CO2 20* 24 21 24 21   GLUCOSE 112* 96  --  81  --   BUN 21 15 13 16 19   CREATININE 0.76 0.88 0.7 0.89 0.8  CALCIUM  8.4* 8.5* 8.5* 8.7* 8.5*  MG 1.9  --   --   --   --    Recent Labs    01/01/24 0004 01/02/24 0557 02/21/24 1452 04/02/24 0000  AST 24 35 28 17  ALT 29 33 31 24  ALKPHOS 55 51 60 81  BILITOT 0.5 0.5 0.6  --   PROT 6.6 6.6 6.7  --   ALBUMIN  3.2* 3.0* 3.5 3.4*   Recent Labs    01/01/24 0004 01/02/24 0557 01/05/24 1805 02/21/24 1435 04/02/24 0000  WBC 6.1 4.2 4.1 7.7 6.8  NEUTROABS 5.0  --  2.0 4.4  --    HGB 11.0* 10.7* 11.7* 12.0* 11.5*  HCT 32.8* 32.2* 34.6* 36.3* 34*  MCV 91.9 92.8 90.8 93.6  --   PLT 227 196 229 283 298   Lab Results  Component Value Date   TSH 1.27 04/02/2024   Lab Results  Component Value Date   HGBA1C 6.4 08/30/2017   Lab Results  Component Value Date   CHOL 126 01/26/2023   HDL 36 01/26/2023   LDLCALC 39 01/26/2023   LDLDIRECT 168.9 05/24/2013   TRIG 254 (A) 01/26/2023   CHOLHDL 4.2 08/04/2020    Significant Diagnostic Results in last 30 days:  No results found.  Assessment/Plan  1. Hordeolum internum of right lower eyelid (Primary) Due to his behaviors warm compresses would be difficult to implement Noted pustule to with surrounding redness - erythromycin ophthalmic ointment; Place 1 Application into the right eye 4 (four) times daily for 7 days.  Return if not improving or worsening.

## 2024-06-12 DIAGNOSIS — B351 Tinea unguium: Secondary | ICD-10-CM | POA: Diagnosis not present

## 2024-06-12 DIAGNOSIS — M2041 Other hammer toe(s) (acquired), right foot: Secondary | ICD-10-CM | POA: Diagnosis not present

## 2024-06-12 DIAGNOSIS — M2042 Other hammer toe(s) (acquired), left foot: Secondary | ICD-10-CM | POA: Diagnosis not present

## 2024-06-21 ENCOUNTER — Encounter: Payer: Self-pay | Admitting: Adult Health

## 2024-06-21 ENCOUNTER — Non-Acute Institutional Stay (SKILLED_NURSING_FACILITY): Payer: Self-pay | Admitting: Adult Health

## 2024-06-21 DIAGNOSIS — M069 Rheumatoid arthritis, unspecified: Secondary | ICD-10-CM

## 2024-06-21 DIAGNOSIS — E782 Mixed hyperlipidemia: Secondary | ICD-10-CM | POA: Diagnosis not present

## 2024-06-21 DIAGNOSIS — R635 Abnormal weight gain: Secondary | ICD-10-CM | POA: Diagnosis not present

## 2024-06-21 DIAGNOSIS — F02C11 Dementia in other diseases classified elsewhere, severe, with agitation: Secondary | ICD-10-CM | POA: Diagnosis not present

## 2024-06-21 DIAGNOSIS — I10 Essential (primary) hypertension: Secondary | ICD-10-CM

## 2024-06-21 DIAGNOSIS — I2510 Atherosclerotic heart disease of native coronary artery without angina pectoris: Secondary | ICD-10-CM

## 2024-06-21 DIAGNOSIS — G308 Other Alzheimer's disease: Secondary | ICD-10-CM | POA: Diagnosis not present

## 2024-06-21 DIAGNOSIS — F4321 Adjustment disorder with depressed mood: Secondary | ICD-10-CM

## 2024-06-21 DIAGNOSIS — I480 Paroxysmal atrial fibrillation: Secondary | ICD-10-CM

## 2024-06-21 NOTE — Progress Notes (Unsigned)
 Location:  [facility] Room Number: 124 P Place of Service:  SNF (31) Provider: Tawni America, NP    Patient Care Team: Charlanne Fredia CROME, MD as PCP - General (Internal Medicine) Shlomo Wilbert SAUNDERS, MD as PCP - Cardiology (Cardiology) Kelsie Agent, MD (Inactive) as PCP - Electrophysiology (Cardiology) Abran Norleen SAILOR, MD (Gastroenterology) Kelsie Agent, MD (Inactive) (Cardiology) Mai Agent FALCON, MD as Consulting Physician (Rheumatology) Alvaro Ricardo KATHEE Raddle., MD as Consulting Physician (Urology) Charmayne Molly, MD as Consulting Physician (Ophthalmology) Carolee Clap (Dentistry) Cheryn Nickels, MD as Referring Physician (Allergy and Immunology) Georjean Darice HERO, MD as Consulting Physician (Neurology) Fate Morna SAILOR, [facility] (Inactive) as Pharmacist (Pharmacist) Georjean Darice HERO, MD as Consulting Physician (Neurology)  Extended Emergency Contact Information Primary Emergency Contact: Archambeau,[name] Address: [address]          [place], [place] Home Phone: [phone] Work Phone: [phone] Mobile Phone: [phone] Relation: Spouse Secondary Emergency Contact: [name],[name] Home Phone: [phone] Mobile Phone: [phone] Relation: Daughter  Code Status:  DNR Goals of care: Advanced Directive information    06/08/2024    8:54 AM  Advanced Directives  Does Patient Have a Medical Advance Directive? Yes  Type of Advance Directive Living will;Out of facility DNR (pink MOST or yellow form)  Does patient want to make changes to medical advance directive? No - Patient declined  Pre-existing out of facility DNR order (yellow form or pink MOST form) Yellow form placed in chart (order not valid for inpatient use)     Chief Complaint  Patient presents with   Routine Visit    HPI:  Pt is a 89 y.o. male seen today for medical management of chronic diseases.    PMH Afib on eliquis ,  hx of CAD with PCI,  NSTEMI, dementia, RA, GERD, falls   Hx of sleep apnea, no longer using cpap  RA off Orencia , follows with Rheumatology  HLD off repatha  due to low cholesterol, on crestor  and zetia . LDL 14 TC 82 05/29/24  Hx of depression on lexapro  and remeron. No reports of verbalization of depression or crying episodes.   Afib: on eliquis  and metoprolol   Multiple falls with injuries including subdural hematoma in May of 2024  Dementia: can be angry and aggressive verbally and physically at times. Followed by Dr Tasia. No current issues. Using risperdal , ativan , and vistaril  to control behaviors.  Needs assistance with all ADLS and a hoyer lift for transfers.  A CT scan of the head and neck showed no acute abnormalities but did reveal chronic microvascular ischemic disease and cerebral atrophy.   His nurse is reporting increased edema in both legs, arms, and weight gain.  No sob or low 02 sat He has gained weight, hard to quantify as recorded weights are quite variable.   Treated for conjunctivitis and hordeolum on the right with erythromycin . Symptoms improved.    Past Medical History:  Diagnosis Date   CAD (coronary artery disease)    a. s/p inferior STEMI 01/08/2014; LHC 01/08/14: total RCA occlusion s/p 3.5x60mm Xience DES distal RCA and 3.5x28 mm DES mid RCA, 60-70% mid LAD stenosis, EF 55%.   Complication of anesthesia    'long to wake up after back surgery  09/2003   Diverticulosis of colon    GERD (gastroesophageal reflux disease)    History of cellulitis    05-26-2015  LLE   History of colon polyps    1998- benign/  2008 adenomatous  History of kidney stones    2013   History of squamous cell carcinoma excision    2013;  2015;  06-12-2015 right leg/  02/ and 05/ 2017  left ear and left leg   Hydronephrosis, left    Hyperlipidemia    Hypertension    Migraine with aura    OSA on CPAP 06/19/2015   Moderate OSA with AHI 18/hr  per study 05-20-2015   Osteoarthritis    Paroxysmal  atrial fibrillation (HCC) 4/16   chads2vasc score is at least 4   Premature atrial contractions    RA (rheumatoid arthritis) [facility])    rheumatologist-  dr lynwood ramsay   Sinusitis, chronic 10/17/2015   Wears glasses    Wears hearing aid    bilateral   Past Surgical History:  Procedure Laterality Date   BACK SURGERY     CARDIOVASCULAR STRESS TEST  11/21/2015   Low risk nuclear study w/ a small diaphragmatic attenuation artifact, no ischemia/  normal LV function and wall motion , ef 63%   CATARACT EXTRACTION W/ INTRAOCULAR LENS  IMPLANT, BILATERAL  2015   COLONOSCOPY  last one 06-09-2011   CORONARY ANGIOPLASTY     CYSTOSCOPY W/ RETROGRADES Left 07/12/2016   Procedure: CYSTOSCOPY WITH RETROGRADE PYELOGRAM LEFT URETERAL STENT;  Surgeon: Norleen Seltzer, MD;  Location: [facility];  Service: Urology;  Laterality: Left;   CYSTOSCOPY WITH RETROGRADE PYELOGRAM, URETEROSCOPY AND STENT PLACEMENT Left 08/11/2016   Procedure: CYSTOSCOPY WITH RETROGRADE PYELOGRAM,  DIAGNOSTIC URETEROSCOPY , STENT EXCHANGE;  Surgeon: Ricardo Likens, MD;  Location: [facility];  Service: Urology;  Laterality: Left;   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   severe fibromatosis palm and fingers   LEFT HEART CATHETERIZATION WITH CORONARY ANGIOGRAM N/A 01/08/2014   Procedure: LEFT HEART CATHETERIZATION WITH CORONARY ANGIOGRAM;  Surgeon: Debby DELENA Sor, MD;  Location: [facility] CATH LAB;  Service: Cardiovascular;  Laterality:N/A;  total/ subtotal RCA/  mLAD 60-70% w/ mid systolic bridging/  preserved global LVF, ef 55%   MOHS SURGERY  x2  feb and may 2017   left ear /  left leg  (SCC)   ORIF ACETABULAR FRACTURE Right 03/29/2023   Procedure: OPEN REDUCTION INTERNAL FIXATION (ORIF) ACETABULAR FRACTURE;  Surgeon: Celena Sharper, MD;  Location: [facility] OR;  Service: Orthopedics;  Laterality: Right;   PERCUTANEOUS CORONARY STENT INTERVENTION (PCI-S)  01/08/2014   Procedure: PERCUTANEOUS CORONARY STENT INTERVENTION (PCI-S);   Surgeon: Debby DELENA Sor, MD;  Location: [facility] CATH LAB;  Service: Cardiovascular;;  DES to mid and distal RCA   POSTERIOR LUMBAR FUSION  10/01/2003   and Laminectomy/ diskectomy  L4 -- S1   ROBOT ASSISTED PYELOPLASTY Left 09/15/2016   Procedure: XI ROBOTIC ASSISTED PYELOPLASTY;  Surgeon: Ricardo Likens, MD;  Location: [facility];  Service: Urology;  Laterality: Left;   TONSILLECTOMY     TRANSTHORACIC ECHOCARDIOGRAM  02/23/2016   mild LVH,  ef 50-55%,  grade 1 diastolic dysfunction/  mild to moderate AV calcification w/ no stenosis or regurg./  trivial MR and TR/  mild PR    Allergies  Allergen Reactions   Methotrexate Other (See Comments)    REACTION: tachycardia   Aricept  [Donepezil  Hcl] Other (See Comments)    Nightmares. Pt takes this medication per [facility]    Crestor  [Rosuvastatin  Calcium ] Other (See Comments)    Myalgias with 20mg  dose   Lisinopril  Other (See Comments)    cough   Namenda  [Memantine ] Diarrhea and Nausea Only   Atorvastatin  Other (See Comments)  Muscle pain, leg cramps   Depakote  [Divalproex  Sodium] Rash    Allergy not listed on MAR    Pravastatin Sodium Other (See Comments)    REACTION: cramps, fatigue    Outpatient Encounter Medications as of 06/21/2024  Medication Sig   acetaminophen  (TYLENOL ) 325 MG tablet Take 2 tablets (650 mg total) by mouth in the morning and at bedtime.   apixaban  (ELIQUIS ) 5 MG TABS tablet Take 1 tablet (5 mg total) by mouth 2 (two) times daily.   camphor-menthol (SARNA) lotion Apply 1 Application topically 2 (two) times daily as needed for itching.   docusate sodium  (COLACE) 100 MG capsule Take 1 capsule (100 mg total) by mouth 2 (two) times daily.   Emollient (CETAPHIL) cream Apply 1 Application topically at bedtime.   erythromycin  ophthalmic ointment Place 1 Application into the right eye 4 (four) times daily.   escitalopram  (LEXAPRO ) 20 MG tablet Take 20 mg by mouth daily.   ezetimibe  (ZETIA ) 10 MG tablet Take 0.5 mg by mouth daily.    haloperidol  (HALDOL ) 1 MG tablet Take 1 mg by mouth 2 (two) times daily as needed for agitation.   Haloperidol  Lactate 10 MG/5ML CONC Take 2.5 mg by mouth daily.   hydrOXYzine  (VISTARIL ) 25 MG capsule Take 25 mg by mouth in the morning and at bedtime.   LORazepam  (ATIVAN ) 0.5 MG tablet Take 1 tablet (0.5 mg total) by mouth 2 (two) times daily. 1 tab, oral, Twice a day Increase Ativan  to 0.5mg  BID   melatonin 5 MG TABS Take 5 mg by mouth at bedtime.   methocarbamol  (ROBAXIN ) 500 MG tablet Take 1 tablet (500 mg total) by mouth every 6 (six) hours as needed for muscle spasms.   metoprolol  tartrate (LOPRESSOR ) 25 MG tablet Take 25 mg by mouth 2 (two) times daily. Hold for SBP <100   mirtazapine (REMERON SOL-TAB) 15 MG disintegrating tablet Take 15 mg by mouth at bedtime.   Multiple Vitamin (MULTIVITAMIN WITH MINERALS) TABS tablet Take 1 tablet by mouth daily.   nitroGLYCERIN  (NITROSTAT ) 0.4 MG SL tablet Place 0.4 mg under the tongue every 5 (five) minutes as needed for chest pain.   omeprazole  (PRILOSEC) 40 MG capsule Take 40 mg by mouth in the morning. Take on empty stomach 30 minutes before breakfast.   risperiDONE  (RISPERDAL ) 0.5 MG tablet Take 1.5 tablets (0.75 mg total) by mouth every morning.   risperiDONE  (RISPERDAL ) 1 MG tablet Take 1 mg by mouth at bedtime.   rosuvastatin  (CRESTOR ) 5 MG tablet Take 5 mg by mouth every morning.   triamcinolone  cream (KENALOG ) 0.1 % Apply 1 Application topically daily as needed (rash / itching).   Evolocumab  (REPATHA  SURECLICK) 140 MG/ML SOAJ INJECT 1 PEN INTO SKIN EVERY 14 DAYS (Patient not taking: Reported on 06/21/2024)   No facility-administered encounter medications on file as of 06/21/2024.    Review of Systems  Unable to perform ROS: Dementia    Immunization History  Administered Date(s) Administered   Fluad Quad(high Dose 65+) 07/09/2019, 08/15/2020, 09/16/2021, 07/29/2022, 09/06/2023   Influenza Split 08/10/2011, 08/15/2012   Influenza Whole  08/21/2009, 09/22/2010   Influenza, High Dose Seasonal PF 09/16/2015, 08/18/2016, 08/31/2017, 08/14/2018   Influenza,inj,Quad PF,6+ Mos 08/06/2013   Influenza-Unspecified 08/31/2017   Moderna Covid-19 Vaccine  Bivalent Booster 13yrs & up 09/06/2023   PFIZER(Purple Top)SARS-COV-2 Vaccination 12/11/2019, 01/01/2020, 08/12/2020   Pfizer Covid-19 Vaccine Bivalent Booster 65yrs & up 03/11/2023   Pneumococcal Conjugate-13 10/02/2013   Pneumococcal Polysaccharide-23 06/04/2010   Tdap 10/22/2011, 09/22/2022  Zoster Recombinant(Shingrix) 03/09/2017, 05/10/2017   Pertinent  Health Maintenance Due  Topic Date Due   INFLUENZA VACCINE  06/15/2024      05/03/2023    4:08 PM 08/15/2023   10:15 AM 08/18/2023    4:26 PM 11/01/2023   12:07 PM 04/24/2024   10:15 AM  Fall Risk  Falls in the past year? 1 0 1 0 1  Was there an injury with Fall? 0 0 1 0 0  Fall Risk Category Calculator 2 0 3 0 1  Patient at Risk for Falls Due to  No Fall Risks History of fall(s)  History of fall(s);Impaired balance/gait;Impaired mobility  Fall risk Follow up Falls evaluation completed Falls evaluation completed Falls evaluation completed Falls evaluation completed Falls evaluation completed;Education provided   Functional Status Survey:    Vitals:   06/21/24 1000  BP: 123/74  Pulse: 68  Resp: 16  Temp: 97.8 F (36.6 C)  SpO2: 97%  Weight: 194 lb (88 kg)  Height: 6' (1.829 m)   Body mass index is 26.31 kg/m. Physical Exam Vitals and nursing note reviewed.  Constitutional:      Appearance: Normal appearance.  HENT:     Head: Normocephalic and atraumatic.     Nose: Nose normal.     Mouth/Throat:     Mouth: Mucous membranes are moist.     Pharynx: Oropharynx is clear.  Eyes:     Conjunctiva/sclera: Conjunctivae normal.     Pupils: Pupils are equal, round, and reactive to light.     Comments: Small amt of redness to the right lower lid. No swelling or drainage.   Cardiovascular:     Rate and Rhythm:  Normal rate and regular rhythm.     Heart sounds: No murmur heard. Pulmonary:     Effort: Pulmonary effort is normal. No respiratory distress.     Breath sounds: Normal breath sounds. No wheezing.  Abdominal:     General: Bowel sounds are normal. There is no distension.     Palpations: Abdomen is soft.     Tenderness: There is no abdominal tenderness.  Musculoskeletal:        General: Swelling (both hands and face.) present.     Cervical back: Normal range of motion. No rigidity.     Right lower leg: Edema present.     Left lower leg: Edema present.  Lymphadenopathy:     Cervical: No cervical adenopathy.  Skin:    General: Skin is warm and dry.  Neurological:     General: No focal deficit present.     Mental Status: He is alert. Mental status is at baseline.  Psychiatric:        Mood and Affect: Mood normal.     Labs reviewed: Recent Labs    01/02/24 0557 01/05/24 1805 01/06/24 0000 02/21/24 1452 04/02/24 0000  NA 135 139 141 138 139  K 3.4* 3.6 3.5 3.6 4.1  CL 105 107 107 106 106  CO2 20* 24 21 24 21   GLUCOSE 112* 96  --  81  --   BUN 21 15 13 16 19   CREATININE 0.76 0.88 0.7 0.89 0.8  CALCIUM  8.4* 8.5* 8.5* 8.7* 8.5*  MG 1.9  --   --   --   --    Recent Labs    01/01/24 0004 01/02/24 0557 02/21/24 1452 04/02/24 0000  AST 24 35 28 17  ALT 29 33 31 24  ALKPHOS 55 51 60 81  BILITOT 0.5 0.5 0.6  --  PROT 6.6 6.6 6.7  --   ALBUMIN  3.2* 3.0* 3.5 3.4*   Recent Labs    01/01/24 0004 01/02/24 0557 01/05/24 1805 02/21/24 1435 04/02/24 0000  WBC 6.1 4.2 4.1 7.7 6.8  NEUTROABS 5.0  --  2.0 4.4  --   HGB 11.0* 10.7* 11.7* 12.0* 11.5*  HCT 32.8* 32.2* 34.6* 36.3* 34*  MCV 91.9 92.8 90.8 93.6  --   PLT 227 196 229 283 298   Lab Results  Component Value Date   TSH 1.27 04/02/2024   Lab Results  Component Value Date   HGBA1C 6.4 08/30/2017   Lab Results  Component Value Date   CHOL 82 05/29/2024   HDL 41 05/29/2024   LDLCALC 14 05/29/2024    LDLDIRECT 168.9 05/24/2013   TRIG 130 05/29/2024   CHOLHDL 4.2 08/04/2020    Significant Diagnostic Results in last 30 days:  No results found.  Assessment/Plan Mixed hyperlipidemia Off repatha  due to low LDL Remains on zetia  and crestor . Will recheck in 3 months, if still running this low we can make further reduction.   Hypertension Controlled on lopressor    CAD (coronary artery disease) On statin, zetia  No current issues.   Alzheimer's dementia (HCC) Not on aricept  or namenda  due to reaction Followed by Dr Tasia for behaviors.  Continues with some aggression but improved Severe stage Continue Risperdal , ativan , vistaril   Paroxysmal atrial fibrillation (HCC) On Eliquis  for CVA risk reduction  Regular on exam Rate controlled with metoprolol    Rheumatoid arthritis (HCC) No issues with pain Off orencia  Only using tylenol    Situational depression No signs of depression On lexapro  and remeron   Weight gain With associated edema Variable weights.  EF 50-55% grade 1 DD 02/23/16 Lasix 20 mg daily for 3 days with Kdur 10 CMP  Labs/tests ordered:  CMP 8/11

## 2024-06-22 ENCOUNTER — Encounter: Payer: Self-pay | Admitting: Adult Health

## 2024-06-22 NOTE — Assessment & Plan Note (Signed)
 Not on aricept  or namenda  due to reaction Followed by Dr Tasia for behaviors.  Continues with some aggression but improved Severe stage Continue Risperdal , ativan , vistaril 

## 2024-06-22 NOTE — Assessment & Plan Note (Addendum)
 On statin, zetia  No current issues.

## 2024-06-22 NOTE — Assessment & Plan Note (Signed)
 No issues with pain Off orencia  Only using tylenol 

## 2024-06-22 NOTE — Assessment & Plan Note (Signed)
 No signs of depression On lexapro  and remeron

## 2024-06-22 NOTE — Assessment & Plan Note (Signed)
 Off repatha  due to low LDL Remains on zetia  and crestor . Will recheck in 3 months, if still running this low we can make further reduction.

## 2024-06-22 NOTE — Assessment & Plan Note (Signed)
 Controlled on lopressor

## 2024-06-22 NOTE — Assessment & Plan Note (Signed)
 On Eliquis  for CVA risk reduction  Regular on exam Rate controlled with metoprolol 

## 2024-06-25 DIAGNOSIS — Z79899 Other long term (current) drug therapy: Secondary | ICD-10-CM | POA: Diagnosis not present

## 2024-06-25 LAB — BASIC METABOLIC PANEL WITH GFR
BUN: 14 (ref 4–21)
CO2: 24 — AB (ref 13–22)
Chloride: 107 (ref 99–108)
Creatinine: 0.8 (ref 0.6–1.3)
Glucose: 105
Potassium: 3.8 meq/L (ref 3.5–5.1)
Sodium: 142 (ref 137–147)

## 2024-06-25 LAB — HEPATIC FUNCTION PANEL
ALT: 19 U/L (ref 10–40)
AST: 23 (ref 14–40)
Alkaline Phosphatase: 70 (ref 25–125)
Bilirubin, Total: 0.2

## 2024-06-25 LAB — COMPREHENSIVE METABOLIC PANEL WITH GFR
Albumin: 3.5 (ref 3.5–5.0)
Calcium: 8.8 (ref 8.7–10.7)
Globulin: 2.7
eGFR: 85

## 2024-07-05 ENCOUNTER — Non-Acute Institutional Stay (SKILLED_NURSING_FACILITY): Payer: Self-pay | Admitting: Adult Health

## 2024-07-05 ENCOUNTER — Encounter: Payer: Self-pay | Admitting: Adult Health

## 2024-07-05 DIAGNOSIS — R635 Abnormal weight gain: Secondary | ICD-10-CM | POA: Diagnosis not present

## 2024-07-05 DIAGNOSIS — F03911 Unspecified dementia, unspecified severity, with agitation: Secondary | ICD-10-CM | POA: Diagnosis not present

## 2024-07-05 NOTE — Progress Notes (Unsigned)
 Location:  [facility] Room Number: 124 P Place of Service:  SNF (31) Provider:  Tawni America, NP    Patient Care Team: Charlanne Fredia CROME, MD as PCP - General (Internal Medicine) Shlomo Wilbert SAUNDERS, MD as PCP - Cardiology (Cardiology) Kelsie Agent, MD (Inactive) as PCP - Electrophysiology (Cardiology) Abran Norleen SAILOR, MD (Gastroenterology) Kelsie Agent, MD (Inactive) (Cardiology) Mai Agent FALCON, MD as Consulting Physician (Rheumatology) Alvaro Ricardo KATHEE Raddle., MD as Consulting Physician (Urology) Charmayne Molly, MD as Consulting Physician (Ophthalmology) Carolee Clap (Dentistry) Cheryn Nickels, MD as Referring Physician (Allergy and Immunology) Georjean Darice HERO, MD as Consulting Physician (Neurology) Fate Morna SAILOR, [facility] (Inactive) as Pharmacist (Pharmacist) Georjean Darice HERO, MD as Consulting Physician (Neurology)  Extended Emergency Contact Information Primary Emergency Contact: Rosten,[name] Address: [address]          [place], [place] Home Phone: [phone] Work Phone: [phone] Mobile Phone: [phone] Relation: Spouse Secondary Emergency Contact: [name],[name] Home Phone: [phone] Mobile Phone: [phone] Relation: Daughter  Code Status:  DNR Goals of care: Advanced Directive information    06/08/2024    8:54 AM  Advanced Directives  Does Patient Have a Medical Advance Directive? Yes  Type of Advance Directive Living will;Out of facility DNR (pink MOST or yellow form)  Does patient want to make changes to medical advance directive? No - Patient declined  Pre-existing out of facility DNR order (yellow form or pink MOST form) Yellow form placed in chart (order not valid for inpatient use)     Chief Complaint  Patient presents with   Acute Visit    Follow up edema    HPI:  Pt is a 89 y.o. male seen today for an acute visit for f/u regarding edema  Resides in SNF with dementia No  reports of sob Nurse reports weight gain and edema Seen 06/21/24 and prescribed lasix  for edema and weight gain for two days. Minimal response Now weight has increased another 5 lbs. 18 lbs in the past month   EF 50-55% grade 1 DD 02/23/16 CMP done 06/25/24 with normal LFTs BUN C  Wt Readings from Last 3 Encounters:  07/05/24 199 lb (90.3 kg)  06/21/24 194 lb (88 kg)  06/08/24 181 lb 6.4 oz (82.3 kg)     Past Medical History:  Diagnosis Date   CAD (coronary artery disease)    a. s/p inferior STEMI 01/08/2014; LHC 01/08/14: total RCA occlusion s/p 3.5x51mm Xience DES distal RCA and 3.5x28 mm DES mid RCA, 60-70% mid LAD stenosis, EF 55%.   Complication of anesthesia    'long to wake up after back surgery  09/2003   Diverticulosis of colon    GERD (gastroesophageal reflux disease)    History of cellulitis    05-26-2015  LLE   History of colon polyps    1998- benign/  2008 adenomatous    History of kidney stones    2013   History of squamous cell carcinoma excision    2013;  2015;  06-12-2015 right leg/  02/ and 05/ 2017  left ear and left leg   Hydronephrosis, left    Hyperlipidemia    Hypertension    Migraine with aura    OSA on CPAP 06/19/2015   Moderate OSA with AHI 18/hr  per study 05-20-2015   Osteoarthritis    Paroxysmal atrial fibrillation (HCC) 4/16   chads2vasc score is at least 4   Premature atrial contractions    RA (rheumatoid  arthritis) [facility])    rheumatologist-  dr lynwood ramsay   Sinusitis, chronic 10/17/2015   Wears glasses    Wears hearing aid    bilateral   Past Surgical History:  Procedure Laterality Date   BACK SURGERY     CARDIOVASCULAR STRESS TEST  11/21/2015   Low risk nuclear study w/ a small diaphragmatic attenuation artifact, no ischemia/  normal LV function and wall motion , ef 63%   CATARACT EXTRACTION W/ INTRAOCULAR LENS  IMPLANT, BILATERAL  2015   COLONOSCOPY  last one 06-09-2011   CORONARY ANGIOPLASTY     CYSTOSCOPY W/ RETROGRADES Left  07/12/2016   Procedure: CYSTOSCOPY WITH RETROGRADE PYELOGRAM LEFT URETERAL STENT;  Surgeon: Norleen Seltzer, MD;  Location: [facility];  Service: Urology;  Laterality: Left;   CYSTOSCOPY WITH RETROGRADE PYELOGRAM, URETEROSCOPY AND STENT PLACEMENT Left 08/11/2016   Procedure: CYSTOSCOPY WITH RETROGRADE PYELOGRAM,  DIAGNOSTIC URETEROSCOPY , STENT EXCHANGE;  Surgeon: Ricardo Likens, MD;  Location[facility];  Service: Urology;  Laterality: Left;   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   severe fibromatosis palm and fingers   LEFT HEART CATHETERIZATION WITH CORONARY ANGIOGRAM N/A 01/08/2014   Procedure: LEFT HEART CATHETERIZATION WITH CORONARY ANGIOGRAM;  Surgeon: Debby DELENA Sor, MD;  Location: [facility] CATH LAB;  Service: Cardiovascular;  Laterality:N/A;  total/ subtotal RCA/  mLAD 60-70% w/ mid systolic bridging/  preserved global LVF, ef 55%   MOHS SURGERY  x2  feb and may 2017   left ear /  left leg  (S[facility])   ORIF ACETABULAR FRACTURE Right 03/29/2023   Procedure: OPEN REDUCTION INTERNAL FIXATION (ORIF) ACETABULAR FRACTURE;  Surgeon: Celena Sharper, MD;  Location: [facility] OR;  Service: Orthopedics;  Laterality: Right;   PERCUTANEOUS CORONARY STENT INTERVENTION (PCI-S)  01/08/2014   Procedure: PERCUTANEOUS CORONARY STENT INTERVENTION (PCI-S);  Surgeon: Debby DELENA Sor, MD;  Location: [facility] CATH LAB;  Service: Cardiovascular;;  DES to mid and distal RCA   POSTERIOR LUMBAR FUSION  10/01/2003   and Laminectomy/ diskectomy  L4 -- S1   ROBOT ASSISTED PYELOPLASTY Left 09/15/2016   Procedure: XI ROBOTIC ASSISTED PYELOPLASTY;  Surgeon: Ricardo Likens, MD;  Location: [facility];  Service: Urology;  Laterality: Left;   TONSILLECTOMY     TRANSTHORACIC ECHOCARDIOGRAM  02/23/2016   mild LVH,  ef 50-55%,  grade 1 diastolic dysfunction/  mild to moderate AV calcification w/ no stenosis or regurg./  trivial MR and TR/  mild PR    Allergies  Allergen Reactions   Methotrexate Other (See Comments)    REACTION:  tachycardia   Aricept  [Donepezil  Hcl] Other (See Comments)    Nightmares. Pt takes this medication per [facility]    Crestor  [Rosuvastatin  Calcium ] Other (See Comments)    Myalgias with 20mg  dose   Lisinopril  Other (See Comments)    cough   Namenda  [Memantine ] Diarrhea and Nausea Only   Atorvastatin  Other (See Comments)    Muscle pain, leg cramps   Depakote  [Divalproex  Sodium] Rash    Allergy not listed on MAR    Pravastatin Sodium Other (See Comments)    REACTION: cramps, fatigue    Outpatient Encounter Medications as of 07/05/2024  Medication Sig   acetaminophen  (TYLENOL ) 325 MG tablet Take 2 tablets (650 mg total) by mouth in the morning and at bedtime.   apixaban  (ELIQUIS ) 5 MG TABS tablet Take 1 tablet (5 mg total) by mouth 2 (two) times daily.   docusate sodium  (COLACE) 100 MG capsule Take 1 capsule (100 mg total) by mouth  2 (two) times daily.   Emollient (CETAPHIL) cream Apply 1 Application topically at bedtime.   escitalopram  (LEXAPRO ) 20 MG tablet Take 20 mg by mouth daily.   ezetimibe  (ZETIA ) 10 MG tablet Take 0.5 mg by mouth daily.   hydrOXYzine  (VISTARIL ) 25 MG capsule Take 25 mg by mouth in the morning and at bedtime.   LORazepam  (ATIVAN ) 0.5 MG tablet Take 1 tablet (0.5 mg total) by mouth 2 (two) times daily. 1 tab, oral, Twice a day Increase Ativan  to 0.5mg  BID   melatonin 5 MG TABS Take 5 mg by mouth at bedtime.   metoprolol  tartrate (LOPRESSOR ) 25 MG tablet Take 25 mg by mouth 2 (two) times daily. Hold for SBP <100   mirtazapine (REMERON SOL-TAB) 15 MG disintegrating tablet Take 15 mg by mouth at bedtime.   Multiple Vitamin (MULTIVITAMIN WITH MINERALS) TABS tablet Take 1 tablet by mouth daily.   omeprazole  (PRILOSEC) 40 MG capsule Take 40 mg by mouth in the morning. Take on empty stomach 30 minutes before breakfast.   risperiDONE  (RISPERDAL ) 0.5 MG tablet Take 1.5 tablets (0.75 mg total) by mouth every morning.   risperiDONE  (RISPERDAL ) 1 MG tablet Take 1 mg by mouth at  bedtime.   rosuvastatin  (CRESTOR ) 5 MG tablet Take 5 mg by mouth every morning.   camphor-menthol (SARNA) lotion Apply 1 Application topically 2 (two) times daily as needed for itching.   erythromycin  ophthalmic ointment Place 1 Application into the right eye 4 (four) times daily. (Patient not taking: Reported on 07/05/2024)   haloperidol  (HALDOL ) 1 MG tablet Take 1 mg by mouth 2 (two) times daily as needed for agitation.   Haloperidol  Lactate 10 MG/5ML CONC Take 2.5 mg by mouth daily. (Patient not taking: Reported on 07/05/2024)   methocarbamol  (ROBAXIN ) 500 MG tablet Take 1 tablet (500 mg total) by mouth every 6 (six) hours as needed for muscle spasms.   nitroGLYCERIN  (NITROSTAT ) 0.4 MG SL tablet Place 0.4 mg under the tongue every 5 (five) minutes as needed for chest pain.   triamcinolone  cream (KENALOG ) 0.1 % Apply 1 Application topically daily as needed (rash / itching).   No facility-administered encounter medications on file as of 07/05/2024.    Review of Systems  Unable to perform ROS: Dementia    Immunization History  Administered Date(s) Administered   Fluad Quad(high Dose 65+) 07/09/2019, 08/15/2020, 09/16/2021, 07/29/2022, 09/06/2023   Influenza Split 08/10/2011, 08/15/2012   Influenza Whole 08/21/2009, 09/22/2010   Influenza, High Dose Seasonal PF 09/16/2015, 08/18/2016, 08/31/2017, 08/14/2018   Influenza,inj,Quad PF,6+ Mos 08/06/2013   Influenza-Unspecified 08/31/2017   Moderna Covid-19 Vaccine  Bivalent Booster 15yrs & up 09/06/2023   PFIZER(Purple Top)SARS-COV-2 Vaccination 12/11/2019, 01/01/2020, 08/12/2020   Pfizer Covid-19 Vaccine Bivalent Booster 41yrs & up 03/11/2023   Pneumococcal Conjugate-13 10/02/2013   Pneumococcal Polysaccharide-23 06/04/2010   Tdap 10/22/2011, 09/22/2022   Zoster Recombinant(Shingrix) 03/09/2017, 05/10/2017   Pertinent  Health Maintenance Due  Topic Date Due   INFLUENZA VACCINE  06/15/2024      05/03/2023    4:08 PM 08/15/2023   10:15 AM  08/18/2023    4:26 PM 11/01/2023   12:07 PM 04/24/2024   10:15 AM  Fall Risk  Falls in the past year? 1 0 1 0 1  Was there an injury with Fall? 0 0 1 0 0  Fall Risk Category Calculator 2 0 3 0 1  Patient at Risk for Falls Due to  No Fall Risks History of fall(s)  History of fall(s);Impaired balance/gait;Impaired mobility  Fall risk Follow up Falls evaluation completed Falls evaluation completed Falls evaluation completed Falls evaluation completed Falls evaluation completed;Education provided   Functional Status Survey:    Vitals:   07/05/24 1439  BP: 122/75  Pulse: 64  Resp: 18  Temp: 97.7 F (36.5 C)  SpO2: 95%  Weight: 194 lb (88 kg)  Height: 6' (1.829 m)   Body mass index is 26.31 kg/m. Physical Exam Vitals and nursing note reviewed.  Constitutional:      Appearance: Normal appearance.  HENT:     Head: Normocephalic and atraumatic.  Cardiovascular:     Rate and Rhythm: Normal rate and regular rhythm.     Heart sounds: No murmur heard. Pulmonary:     Effort: Pulmonary effort is normal. No respiratory distress.     Breath sounds: No wheezing.  Abdominal:     General: Bowel sounds are normal. There is no distension.     Palpations: Abdomen is soft.     Tenderness: There is no abdominal tenderness.  Musculoskeletal:     Cervical back: Normal range of motion. No rigidity.     Right lower leg: Edema (+2) present.     Left lower leg: Edema (+2) present.  Lymphadenopathy:     Cervical: No cervical adenopathy.  Skin:    General: Skin is warm and dry.  Neurological:     General: No focal deficit present.     Mental Status: He is alert. Mental status is at baseline.     Labs reviewed: Recent Labs    01/02/24 0557 01/05/24 1805 01/06/24 0000 02/21/24 1452 04/02/24 0000 06/25/24 0000  NA 135 139   < > 138 139 142  K 3.4* 3.6   < > 3.6 4.1 3.8  CL 105 107   < > 106 106 107  CO2 20* 24   < > 24 21 24*  GLUCOSE 112* 96  --  81  --   --   BUN 21 15   < > 16 19  14   CREATININE 0.76 0.88   < > 0.89 0.8 0.8  CALCIUM  8.4* 8.5*   < > 8.7* 8.5* 8.8  MG 1.9  --   --   --   --   --    < > = values in this interval not displayed.   Recent Labs    01/01/24 0004 01/02/24 0557 02/21/24 1452 04/02/24 0000 06/25/24 0000  AST 24 35 28 17 23   ALT 29 33 31 24 19   ALKPHOS 55 51 60 81 70  BILITOT 0.5 0.5 0.6  --   --   PROT 6.6 6.6 6.7  --   --   ALBUMIN  3.2* 3.0* 3.5 3.4* 3.5   Recent Labs    01/01/24 0004 01/02/24 0557 01/05/24 1805 02/21/24 1435 04/02/24 0000  WBC 6.1 4.2 4.1 7.7 6.8  NEUTROABS 5.0  --  2.0 4.4  --   HGB 11.0* 10.7* 11.7* 12.0* 11.5*  HCT 32.8* 32.2* 34.6* 36.3* 34*  MCV 91.9 92.8 90.8 93.6  --   PLT 227 196 229 283 298   Lab Results  Component Value Date   TSH 1.27 04/02/2024   Lab Results  Component Value Date   HGBA1C 6.4 08/30/2017   Lab Results  Component Value Date   CHOL 82 05/29/2024   HDL 41 05/29/2024   LDLCALC 14 05/29/2024   LDLDIRECT 168.9 05/24/2013   TRIG 130 05/29/2024   CHOLHDL 4.2 08/04/2020    Significant Diagnostic Results  in last 30 days:  No results found.  Assessment/Plan  1. Weight gain (Primary) CMP after lasix  was ok Weight gain due to fluid balance and possibly psych meds. No sob If not improving consider further work up Pt is with dementia and behaviors, avoiding aggressive workup.  - furosemide  (LASIX ) 20 MG tablet; Take 1 tablet (20 mg total) by mouth daily. Daily for 1 week then M W F - potassium chloride  (KLOR-CON  M) 10 MEQ tablet; Take 1 tablet (10 mEq total) by mouth 2 (two) times daily. Daily for 1 week then M W F   Recent Lipid panel with cholesterol repath stopped TSH normal Normal fasting glucose   Follow electrolytes, BUN/Cr  2. Agitation with dementia Dr Tasia agreed for me to adjust his meds.  Increase vistaril  to tid due to lunch time agitation

## 2024-07-06 ENCOUNTER — Encounter: Payer: Self-pay | Admitting: Adult Health

## 2024-07-06 MED ORDER — POTASSIUM CHLORIDE CRYS ER 10 MEQ PO TBCR: 10.0000 meq | EXTENDED_RELEASE_TABLET | Freq: Two times a day (BID) | ORAL | Status: DC

## 2024-07-06 MED ORDER — FUROSEMIDE 20 MG PO TABS: 20.0000 mg | ORAL_TABLET | Freq: Every day | ORAL | Status: DC

## 2024-07-06 NOTE — Addendum Note (Signed)
 Addended by: Marckus Hanover L on: 07/06/2024 12:40 PM   Modules accepted: Orders

## 2024-07-18 ENCOUNTER — Encounter: Payer: Self-pay | Admitting: Orthopedic Surgery

## 2024-07-18 ENCOUNTER — Non-Acute Institutional Stay (SKILLED_NURSING_FACILITY): Payer: Self-pay | Admitting: Orthopedic Surgery

## 2024-07-18 DIAGNOSIS — E782 Mixed hyperlipidemia: Secondary | ICD-10-CM

## 2024-07-18 DIAGNOSIS — R600 Localized edema: Secondary | ICD-10-CM | POA: Diagnosis not present

## 2024-07-18 DIAGNOSIS — F02C11 Dementia in other diseases classified elsewhere, severe, with agitation: Secondary | ICD-10-CM

## 2024-07-18 DIAGNOSIS — G308 Other Alzheimer's disease: Secondary | ICD-10-CM

## 2024-07-18 DIAGNOSIS — I10 Essential (primary) hypertension: Secondary | ICD-10-CM

## 2024-07-18 DIAGNOSIS — M069 Rheumatoid arthritis, unspecified: Secondary | ICD-10-CM | POA: Diagnosis not present

## 2024-07-18 DIAGNOSIS — F4321 Adjustment disorder with depressed mood: Secondary | ICD-10-CM | POA: Diagnosis not present

## 2024-07-18 DIAGNOSIS — I2510 Atherosclerotic heart disease of native coronary artery without angina pectoris: Secondary | ICD-10-CM

## 2024-07-18 DIAGNOSIS — I480 Paroxysmal atrial fibrillation: Secondary | ICD-10-CM | POA: Diagnosis not present

## 2024-07-19 NOTE — Progress Notes (Signed)
 Location:  [facility] Room Number: 124-P Place of Service:  SNF [phone]) Provider:  Greig FORBES Cluster, NP   Charlanne Fredia CROME, MD  Patient Care Team: Charlanne Fredia CROME, MD as PCP - General (Internal Medicine) Shlomo Wilbert SAUNDERS, MD as PCP - Cardiology (Cardiology) Kelsie Agent, MD (Inactive) as PCP - Electrophysiology (Cardiology) Abran Norleen SAILOR, MD (Gastroenterology) Kelsie Agent, MD (Inactive) (Cardiology) Mai Agent FALCON, MD as Consulting Physician (Rheumatology) Alvaro Ricardo KATHEE Raddle., MD as Consulting Physician (Urology) Charmayne Molly, MD as Consulting Physician (Ophthalmology) Carolee Clap (Dentistry) Cheryn Nickels, MD as Referring Physician (Allergy and Immunology) Georjean Darice HERO, MD as Consulting Physician (Neurology) Fate Morna SAILOR, [facility] (Inactive) as Pharmacist (Pharmacist) Georjean Darice HERO, MD as Consulting Physician (Neurology)  Extended Emergency Contact Information Primary Emergency Contact: Seoane,[name] Address: [address]          [place], [place] Home Phone: [phone] Work Phone: [phone] Mobile Phone: [phone] Relation: Spouse Secondary Emergency Contact: [name],[name] Home Phone: [phone] Mobile Phone: [phone] Relation: Daughter  Code Status:  DNR Goals of care: Advanced Directive information    07/18/2024    2:58 PM  Advanced Directives  Does Patient Have a Medical Advance Directive? Yes  Type of Estate agent of [place];Living will;Out of facility DNR (pink MOST or yellow form)  Does patient want to make changes to medical advance directive? No - Patient declined  Copy of Healthcare Power of Attorney in Chart? Yes - validated most recent copy scanned in chart (See row information)     Chief Complaint  Patient presents with   Medical Management of Chronic Issues    Routine visit. Discuss the need for Influenza vaccine, and Covid Booster.    HPI:   Pt is a 89 y.o. male seen today for medical management of chronic diseases.    He currently resides on the skilled nursing unit due to Alzheimer's dementia. PMH: CAD s/p STEMI 2015, HTN, HLD, PAF, GERD, frequent falls s/p subdural hematoma 09/2022, left hydronephrosis, RA, migraine, OSA, OA, depression and insomnia.   HTN- BUN/creat 14/0.8 06/25/2024, remains on metoprolol  HLD- total cholesterol 82, LDL 14 05/29/2024, off Repatha  due to low LDL, remains on Zetia  and Crestor > recheck lipid panel 08/2024 CAD- see above PAF- rate controlled with metoprolol , on Eliquis  for clot prevention, TSH 1.27 04/02/2024 Alzheimer's dementia- CT head noted chronic microvascular ischemic disease, followed by Dr. Tasia due to agitation> Ativan  and risperidone  increased> nursing reports worsening behaviors with medication changes> using more haldol  ambulates with Juditta, hoyer transfer, dependent with ADLs, remains on hydroxyzine    Depression- see above, remains on mirtazapine and escitalopram , Na+ 142 06/25/2024 RA- off Orencia , remains on tylenol   Weight gain with edema- LVEF 50-55% 02/2016, remains on furosemide /KCL 3x/week  Recent weights:  09/01- 197.6 lbs  08/01- 194 lbs  07/01- 181.4 lbs   Recent blood pressures:  09/04- 150/81  09/03- 102/60, 150/84  09/02- 116/81, 127/74      Past Medical History:  Diagnosis Date   CAD (coronary artery disease)    a. s/p inferior STEMI 01/08/2014; LHC 01/08/14: total RCA occlusion s/p 3.5x52mm Xience DES distal RCA and 3.5x28 mm DES mid RCA, 60-70% mid LAD stenosis, EF 55%.   Complication of anesthesia    'long to wake up after back surgery  09/2003   Diverticulosis of colon    GERD (gastroesophageal reflux disease)    History of cellulitis    05-26-2015  LLE   History  of colon polyps    1998- benign/  2008 adenomatous    History of kidney stones    2013   History of squamous cell carcinoma excision    2013;  2015;  06-12-2015 right leg/  02/ and  05/ 2017  left ear and left leg   Hydronephrosis, left    Hyperlipidemia    Hypertension    Migraine with aura    OSA on CPAP 06/19/2015   Moderate OSA with AHI 18/hr  per study 05-20-2015   Osteoarthritis    Paroxysmal atrial fibrillation (HCC) 4/16   chads2vasc score is at least 4   Premature atrial contractions    RA (rheumatoid arthritis) [facility])    rheumatologist-  dr lynwood ramsay   Sinusitis, chronic 10/17/2015   Wears glasses    Wears hearing aid    bilateral   Past Surgical History:  Procedure Laterality Date   BACK SURGERY     CARDIOVASCULAR STRESS TEST  11/21/2015   Low risk nuclear study w/ a small diaphragmatic attenuation artifact, no ischemia/  normal LV function and wall motion , ef 63%   CATARACT EXTRACTION W/ INTRAOCULAR LENS  IMPLANT, BILATERAL  2015   COLONOSCOPY  last one 06-09-2011   CORONARY ANGIOPLASTY     CYSTOSCOPY W/ RETROGRADES Left 07/12/2016   Procedure: CYSTOSCOPY WITH RETROGRADE PYELOGRAM LEFT URETERAL STENT;  Surgeon: Norleen Seltzer, MD;  Location: [facility];  Service: Urology;  Laterality: Left;   CYSTOSCOPY WITH RETROGRADE PYELOGRAM, URETEROSCOPY AND STENT PLACEMENT Left 08/11/2016   Procedure: CYSTOSCOPY WITH RETROGRADE PYELOGRAM,  DIAGNOSTIC URETEROSCOPY , STENT EXCHANGE;  Surgeon: Ricardo Likens, MD;  Location: [facility];  Service: Urology;  Laterality: Left;   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   severe fibromatosis palm and fingers   LEFT HEART CATHETERIZATION WITH CORONARY ANGIOGRAM N/A 01/08/2014   Procedure: LEFT HEART CATHETERIZATION WITH CORONARY ANGIOGRAM;  Surgeon: Debby DELENA Sor, MD;  Location: [facility] CATH LAB;  Service: Cardiovascular;  Laterality:N/A;  total/ subtotal RCA/  mLAD 60-70% w/ mid systolic bridging/  preserved global LVF, ef 55%   MOHS SURGERY  x2  feb and may 2017   left ear /  left leg  (SCC)   ORIF ACETABULAR FRACTURE Right 03/29/2023   Procedure: OPEN REDUCTION INTERNAL FIXATION (ORIF) ACETABULAR FRACTURE;   Surgeon: Celena Sharper, MD;  Location: [facility] OR;  Service: Orthopedics;  Laterality: Right;   PERCUTANEOUS CORONARY STENT INTERVENTION (PCI-S)  01/08/2014   Procedure: PERCUTANEOUS CORONARY STENT INTERVENTION (PCI-S);  Surgeon: Debby DELENA Sor, MD;  Location: [facility] CATH LAB;  Service: Cardiovascular;;  DES to mid and distal RCA   POSTERIOR LUMBAR FUSION  10/01/2003   and Laminectomy/ diskectomy  L4 -- S1   ROBOT ASSISTED PYELOPLASTY Left 09/15/2016   Procedure: XI ROBOTIC ASSISTED PYELOPLASTY;  Surgeon: Ricardo Likens, MD;  Location: [facility];  Service: Urology;  Laterality: Left;   TONSILLECTOMY     TRANSTHORACIC ECHOCARDIOGRAM  02/23/2016   mild LVH,  ef 50-55%,  grade 1 diastolic dysfunction/  mild to moderate AV calcification w/ no stenosis or regurg./  trivial MR and TR/  mild PR    Allergies  Allergen Reactions   Methotrexate Other (See Comments)    REACTION: tachycardia   Aricept  [Donepezil  Hcl] Other (See Comments)    Nightmares. Pt takes this medication per [facility]    Crestor  [Rosuvastatin  Calcium ] Other (See Comments)    Myalgias with 20mg  dose   Lisinopril  Other (See Comments)    cough  Namenda  [Memantine ] Diarrhea and Nausea Only   Atorvastatin  Other (See Comments)    Muscle pain, leg cramps   Depakote  [Divalproex  Sodium] Rash    Allergy not listed on MAR    Pravastatin Sodium Other (See Comments)    REACTION: cramps, fatigue    Outpatient Encounter Medications as of 07/18/2024  Medication Sig   acetaminophen  (TYLENOL ) 325 MG tablet Take 2 tablets (650 mg total) by mouth in the morning and at bedtime.   apixaban  (ELIQUIS ) 5 MG TABS tablet Take 1 tablet (5 mg total) by mouth 2 (two) times daily.   camphor-menthol (SARNA) lotion Apply 1 Application topically 2 (two) times daily as needed for itching.   docusate sodium  (COLACE) 100 MG capsule Take 1 capsule (100 mg total) by mouth 2 (two) times daily.   Emollient (CETAPHIL) cream Apply 1 Application topically at bedtime.    escitalopram  (LEXAPRO ) 20 MG tablet Take 20 mg by mouth daily.   ezetimibe  (ZETIA ) 10 MG tablet Take 0.5 mg by mouth daily.   furosemide  (LASIX ) 20 MG tablet Take 1 tablet (20 mg total) by mouth daily. Daily for 1 week then M W F   furosemide  (LASIX ) 20 MG tablet Take 20 mg by mouth 3 (three) times a week. In the morning on Monday, Wednesday, and Friday.   haloperidol  (HALDOL ) 1 MG tablet Take 1 mg by mouth 2 (two) times daily as needed for agitation.   haloperidol  lactate (HALDOL ) 5 MG/ML injection Inject 2.5 mg into the muscle daily as needed.   hydrOXYzine  (VISTARIL ) 25 MG capsule Take 25 mg by mouth 3 (three) times daily.   LORazepam  (ATIVAN ) 0.5 MG tablet Take 1 tablet (0.5 mg total) by mouth 2 (two) times daily. 1 tab, oral, Twice a day Increase Ativan  to 0.5mg  BID   LORazepam  (ATIVAN ) 0.5 MG tablet Take 0.5 mg by mouth 3 (three) times daily.   melatonin 5 MG TABS Take 5 mg by mouth at bedtime.   methocarbamol  (ROBAXIN ) 500 MG tablet Take 1 tablet (500 mg total) by mouth every 6 (six) hours as needed for muscle spasms.   metoprolol  tartrate (LOPRESSOR ) 25 MG tablet Take 25 mg by mouth 2 (two) times daily. Hold for SBP <100   mirtazapine (REMERON SOL-TAB) 15 MG disintegrating tablet Take 15 mg by mouth at bedtime.   Multiple Vitamin (MULTIVITAMIN WITH MINERALS) TABS tablet Take 1 tablet by mouth daily.   nitroGLYCERIN  (NITROSTAT ) 0.4 MG SL tablet Place 0.4 mg under the tongue every 5 (five) minutes as needed for chest pain.   omeprazole  (PRILOSEC) 40 MG capsule Take 40 mg by mouth in the morning. Take on empty stomach 30 minutes before breakfast.   potassium chloride  (KLOR-CON  M) 10 MEQ tablet Take 1 tablet (10 mEq total) by mouth 2 (two) times daily. Daily for 1 week then M W F   potassium chloride  (KLOR-CON ) 10 MEQ tablet Take 10 mEq by mouth 3 (three) times a week. Monday, Wednesday, and Friday.   risperiDONE  (RISPERDAL ) 1 MG tablet Take 1 mg by mouth every morning.   risperiDONE   (RISPERDAL ) 1 MG tablet Take 1.5 mg by mouth every evening.   rosuvastatin  (CRESTOR ) 5 MG tablet Take 5 mg by mouth every morning.   triamcinolone  cream (KENALOG ) 0.1 % Apply 1 Application topically daily as needed (rash / itching).   risperiDONE  (RISPERDAL ) 0.5 MG tablet Take 1.5 tablets (0.75 mg total) by mouth every morning. (Patient not taking: Reported on 07/18/2024)   No facility-administered encounter medications on file  as of 07/18/2024.    Review of Systems  Unable to perform ROS: Dementia    Immunization History  Administered Date(s) Administered   Fluad Quad(high Dose 65+) 07/09/2019, 08/15/2020, 09/16/2021, 07/29/2022, 09/06/2023   INFLUENZA, HIGH DOSE SEASONAL PF 09/16/2015, 08/18/2016, 08/31/2017, 08/14/2018   Influenza Split 08/10/2011, 08/15/2012   Influenza Whole 08/21/2009, 09/22/2010   Influenza,inj,Quad PF,6+ Mos 08/06/2013   Influenza-Unspecified 08/31/2017   Moderna Covid-19 Vaccine  Bivalent Booster 69yrs & up 09/06/2023   PFIZER(Purple Top)SARS-COV-2 Vaccination 12/11/2019, 01/01/2020, 08/12/2020   Pfizer Covid-19 Vaccine Bivalent Booster 18yrs & up 03/11/2023   Pneumococcal Conjugate-13 10/02/2013   Pneumococcal Polysaccharide-23 06/04/2010   Tdap 10/22/2011, 09/22/2022   Zoster Recombinant(Shingrix) 03/09/2017, 05/10/2017   Pertinent  Health Maintenance Due  Topic Date Due   INFLUENZA VACCINE  06/15/2024      05/03/2023    4:08 PM 08/15/2023   10:15 AM 08/18/2023    4:26 PM 11/01/2023   12:07 PM 04/24/2024   10:15 AM  Fall Risk  Falls in the past year? 1 0 1 0 1  Was there an injury with Fall? 0 0 1 0 0  Fall Risk Category Calculator 2 0 3 0 1  Patient at Risk for Falls Due to  No Fall Risks History of fall(s)  History of fall(s);Impaired balance/gait;Impaired mobility  Fall risk Follow up Falls evaluation completed Falls evaluation completed Falls evaluation completed Falls evaluation completed Falls evaluation completed;Education provided   Functional  Status Survey:    Vitals:   07/18/24 1456  BP: (!) 150/84  Pulse: 62  Resp: 18  Temp: 97.7 F (36.5 C)  SpO2: 95%  Weight: 197 lb 9.6 oz (89.6 kg)  Height: 6' (1.829 m)   Body mass index is 26.8 kg/m. Physical Exam Vitals reviewed.  Constitutional:      General: He is not in acute distress. HENT:     Head: Normocephalic.  Eyes:     General:        Right eye: No discharge.        Left eye: No discharge.  Cardiovascular:     Rate and Rhythm: Normal rate and regular rhythm.     Pulses: Normal pulses.     Heart sounds: Normal heart sounds.  Pulmonary:     Effort: Pulmonary effort is normal. No respiratory distress.     Breath sounds: Normal breath sounds. No wheezing or rales.  Abdominal:     General: Bowel sounds are normal. There is no distension.     Palpations: Abdomen is soft.     Tenderness: There is no abdominal tenderness.  Musculoskeletal:     Cervical back: Neck supple.     Right lower leg: Edema present.     Left lower leg: Edema present.     Comments: BLE 1+ pitting  Skin:    General: Skin is warm.     Capillary Refill: Capillary refill takes less than 2 seconds.  Neurological:     General: No focal deficit present.     Mental Status: He is alert. Mental status is at baseline.     Motor: Weakness present.     Gait: Gait abnormal.  Psychiatric:        Mood and Affect: Mood normal.     Comments: Aphasia, alert to self/familiar face, does not follow commands     Labs reviewed: Recent Labs    01/02/24 0557 01/05/24 1805 01/06/24 0000 02/21/24 1452 04/02/24 0000 06/25/24 0000  NA 135 139   < >  138 139 142  K 3.4* 3.6   < > 3.6 4.1 3.8  CL 105 107   < > 106 106 107  CO2 20* 24   < > 24 21 24*  GLUCOSE 112* 96  --  81  --   --   BUN 21 15   < > 16 19 14   CREATININE 0.76 0.88   < > 0.89 0.8 0.8  CALCIUM  8.4* 8.5*   < > 8.7* 8.5* 8.8  MG 1.9  --   --   --   --   --    < > = values in this interval not displayed.   Recent Labs     01/01/24 0004 01/02/24 0557 02/21/24 1452 04/02/24 0000 06/25/24 0000  AST 24 35 28 17 23   ALT 29 33 31 24 19   ALKPHOS 55 51 60 81 70  BILITOT 0.5 0.5 0.6  --   --   PROT 6.6 6.6 6.7  --   --   ALBUMIN  3.2* 3.0* 3.5 3.4* 3.5   Recent Labs    01/01/24 0004 01/02/24 0557 01/05/24 1805 02/21/24 1435 04/02/24 0000  WBC 6.1 4.2 4.1 7.7 6.8  NEUTROABS 5.0  --  2.0 4.4  --   HGB 11.0* 10.7* 11.7* 12.0* 11.5*  HCT 32.8* 32.2* 34.6* 36.3* 34*  MCV 91.9 92.8 90.8 93.6  --   PLT 227 196 229 283 298   Lab Results  Component Value Date   TSH 1.27 04/02/2024   Lab Results  Component Value Date   HGBA1C 6.4 08/30/2017   Lab Results  Component Value Date   CHOL 82 05/29/2024   HDL 41 05/29/2024   LDLCALC 14 05/29/2024   LDLDIRECT 168.9 05/24/2013   TRIG 130 05/29/2024   CHOLHDL 4.2 08/04/2020    Significant Diagnostic Results in last 30 days:  No results found.  Assessment/Plan 1. Primary hypertension (Primary) - controlled, goal < 150/90 - cont metoprolol   2. Mixed hyperlipidemia - off Repatha  due to low LDL - cont Zetia  and Crestor  - recheck lipid panel 08/2024  3. Coronary artery disease involving native coronary artery of native heart without angina pectoris - see above - cont statin  4. Paroxysmal atrial fibrillation (HCC) - HR<100 with metoprolol  - cont Eliquis  for clot prevention  5. Severe Alzheimer's dementia of other onset with agitation (HCC) - followed by Dr. Tasia - Risperdal  and ativan  recently increased> worsening behaviors> using haldol  prn more - will continue increased ativan , reduce Risperdal >? paradoxical effect - cont hydroxyzine   - cont skilled nursing   6. Situational depression - see above - cont escitalopram  and mirtazapine  - Na+ stable  7. Rheumatoid arthritis involving elbow, unspecified laterality, unspecified whether rheumatoid factor present (HCC) - followed by rheumatology - off orencia  - cont tylenol    8. Edema of  both lower legs - BLE 1+ pitting edema with weight gain - sedentary in Juditta - LVEF was 50-55% 2017 - cont furosemide GUSSIE 3x/week    Family/ staff Communication: plan discussed with nurse  Labs/tests ordered:  lipid panel due 08/2024

## 2024-07-20 ENCOUNTER — Other Ambulatory Visit: Payer: Self-pay | Admitting: Adult Health

## 2024-07-20 MED ORDER — LORAZEPAM 0.5 MG PO TABS
0.5000 mg | ORAL_TABLET | Freq: Three times a day (TID) | ORAL | 2 refills | Status: DC
Start: 2024-07-20 — End: 2024-08-16

## 2024-08-09 ENCOUNTER — Encounter: Payer: Self-pay | Admitting: Adult Health

## 2024-08-09 ENCOUNTER — Non-Acute Institutional Stay (SKILLED_NURSING_FACILITY): Payer: Self-pay | Admitting: Adult Health

## 2024-08-09 DIAGNOSIS — L089 Local infection of the skin and subcutaneous tissue, unspecified: Secondary | ICD-10-CM | POA: Diagnosis not present

## 2024-08-09 DIAGNOSIS — T148XXA Other injury of unspecified body region, initial encounter: Secondary | ICD-10-CM | POA: Diagnosis not present

## 2024-08-09 MED ORDER — MUPIROCIN 2 % EX OINT: 1.0000 | TOPICAL_OINTMENT | Freq: Two times a day (BID) | CUTANEOUS | Status: AC

## 2024-08-09 NOTE — Progress Notes (Signed)
 Location:  Medical illustrator of Service:  SNF (31) Provider:   Bari America, ANP [facility] [phone]   Charlanne Fredia CROME, MD  Patient Care Team: Charlanne Fredia CROME, MD as PCP - General (Internal Medicine) Shlomo Wilbert SAUNDERS, MD as PCP - Cardiology (Cardiology) Kelsie Agent, MD (Inactive) as PCP - Electrophysiology (Cardiology) Abran Norleen SAILOR, MD (Gastroenterology) Kelsie Agent, MD (Inactive) (Cardiology) Mai Agent FALCON, MD as Consulting Physician (Rheumatology) Alvaro Ricardo KATHEE Raddle., MD as Consulting Physician (Urology) Charmayne Molly, MD as Consulting Physician (Ophthalmology) Carolee Clap (Dentistry) Cheryn Nickels, MD as Referring Physician (Allergy and Immunology) Georjean Darice HERO, MD as Consulting Physician (Neurology) Fate Morna SAILOR, [facility] (Inactive) as Pharmacist (Pharmacist) Georjean Darice HERO, MD as Consulting Physician (Neurology)  Extended Emergency Contact Information Primary Emergency Contact: Grossberg,[name] Address: [address]          [place], [place] Home Phone: [phone] Work Phone: [phone] Mobile Phone: [phone] Relation: Spouse Secondary Emergency Contact: [name],[name] Home Phone: [phone] Mobile Phone: [phone] Relation: Daughter  Code Status:  DNR Goals of care: Advanced Directive information    07/18/2024    2:58 PM  Advanced Directives  Does Patient Have a Medical Advance Directive? Yes  Type of Estate agent of [place];Living will;Out of facility DNR (pink MOST or yellow form)  Does patient want to make changes to medical advance directive? No - Patient declined  Copy of Healthcare Power of Attorney in Chart? Yes - validated most recent copy scanned in chart (See row information)     Chief Complaint  Patient presents with   Acute Visit    Concern for wound infection     HPI:  Pt is a 89 y.o. male seen today for an acute visit for  concern for wound infection  The nurse asked me to see a skin tear that had some surrounding erythema. No significant drainage. No fever or other issues.   In addition he was started on lasix  three times weekly due to weight gain and issues with recurrent edema. No sob or chest pain. Edema has improved. Also wear compression. No recent weight since lasix  was initiated on 07/05/24   2 D echo: EF 50-55% grade 1 DD 02/23/16   Past Medical History:  Diagnosis Date   CAD (coronary artery disease)    a. s/p inferior STEMI 01/08/2014; LHC 01/08/14: total RCA occlusion s/p 3.5x69mm Xience DES distal RCA and 3.5x28 mm DES mid RCA, 60-70% mid LAD stenosis, EF 55%.   Complication of anesthesia    'long to wake up after back surgery  09/2003   Diverticulosis of colon    GERD (gastroesophageal reflux disease)    History of cellulitis    05-26-2015  LLE   History of colon polyps    1998- benign/  2008 adenomatous    History of kidney stones    2013   History of squamous cell carcinoma excision    2013;  2015;  06-12-2015 right leg/  02/ and 05/ 2017  left ear and left leg   Hydronephrosis, left    Hyperlipidemia    Hypertension    Migraine with aura    OSA on CPAP 06/19/2015   Moderate OSA with AHI 18/hr  per study 05-20-2015   Osteoarthritis    Paroxysmal atrial fibrillation (HCC) 4/16   chads2vasc score is at least 4   Premature atrial contractions    RA (rheumatoid arthritis) (HCC)  rheumatologist-  dr lynwood ramsay   Sinusitis, chronic 10/17/2015   Wears glasses    Wears hearing aid    bilateral   Past Surgical History:  Procedure Laterality Date   BACK SURGERY     CARDIOVASCULAR STRESS TEST  11/21/2015   Low risk nuclear study w/ a small diaphragmatic attenuation artifact, no ischemia/  normal LV function and wall motion , ef 63%   CATARACT EXTRACTION W/ INTRAOCULAR LENS  IMPLANT, BILATERAL  2015   COLONOSCOPY  last one 06-09-2011   CORONARY ANGIOPLASTY     CYSTOSCOPY W/  RETROGRADES Left 07/12/2016   Procedure: CYSTOSCOPY WITH RETROGRADE PYELOGRAM LEFT URETERAL STENT;  Surgeon: Norleen Seltzer, MD;  Location: [facility];  Service: Urology;  Laterality: Left;   CYSTOSCOPY WITH RETROGRADE PYELOGRAM, URETEROSCOPY AND STENT PLACEMENT Left 08/11/2016   Procedure: CYSTOSCOPY WITH RETROGRADE PYELOGRAM,  DIAGNOSTIC URETEROSCOPY , STENT EXCHANGE;  Surgeon: Ricardo Likens, MD;  Location: [facility];  Service: Urology;  Laterality: Left;   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   severe fibromatosis palm and fingers   LEFT HEART CATHETERIZATION WITH CORONARY ANGIOGRAM N/A 01/08/2014   Procedure: LEFT HEART CATHETERIZATION WITH CORONARY ANGIOGRAM;  Surgeon: Debby DELENA Sor, MD;  Location: [facility] CATH LAB;  Service: Cardiovascular;  Laterality:N/A;  total/ subtotal RCA/  mLAD 60-70% w/ mid systolic bridging/  preserved global LVF, ef 55%   MOHS SURGERY  x2  feb and may 2017   left ear /  left leg  (SCC)   ORIF ACETABULAR FRACTURE Right 03/29/2023   Procedure: OPEN REDUCTION INTERNAL FIXATION (ORIF) ACETABULAR FRACTURE;  Surgeon: Celena Sharper, MD;  Location: [facility] OR;  Service: Orthopedics;  Laterality: Right;   PERCUTANEOUS CORONARY STENT INTERVENTION (PCI-S)  01/08/2014   Procedure: PERCUTANEOUS CORONARY STENT INTERVENTION (PCI-S);  Surgeon: Debby DELENA Sor, MD;  Location: [facility] C[facility] LAB;  Service: Cardiovascular;;  DES to mid and distal RCA   POSTERIOR LUMBAR FUSION  10/01/2003   and Laminectomy/ diskectomy  L4 -- S1   ROBOT ASSISTED PYELOPLASTY Left 09/15/2016   Procedure: XI ROBOTIC ASSISTED PYELOPLASTY;  Surgeon: Ricardo Likens, MD;  Location: [facility];  Service: Urology;  Laterality: Left;   TONSILLECTOMY     TRANSTHORACIC ECHOCARDIOGRAM  02/23/2016   mild LVH,  ef 50-55%,  grade 1 diastolic dysfunction/  mild to moderate AV calcification w/ no stenosis or regurg./  trivial MR and TR/  mild PR    Allergies  Allergen Reactions   Methotrexate Other (See Comments)     REACTION: tachycardia   Aricept  [Donepezil  Hcl] Other (See Comments)    Nightmares. Pt takes this medication per [facility]    Crestor  [Rosuvastatin  Calcium ] Other (See Comments)    Myalgias with 20mg  dose   Lisinopril  Other (See Comments)    cough   Namenda  [Memantine ] Diarrhea and Nausea Only   Atorvastatin  Other (See Comments)    Muscle pain, leg cramps   Depakote  [Divalproex  Sodium] Rash    Allergy not listed on MAR    Pravastatin Sodium Other (See Comments)    REACTION: cramps, fatigue    Outpatient Encounter Medications as of 08/09/2024  Medication Sig   mupirocin  ointment (BACTROBAN ) 2 % Apply 1 Application topically 2 (two) times daily for 7 days.   acetaminophen  (TYLENOL ) 325 MG tablet Take 2 tablets (650 mg total) by mouth in the morning and at bedtime.   apixaban  (ELIQUIS ) 5 MG TABS tablet Take 1 tablet (5 mg total) by mouth 2 (two) times daily.   camphor-menthol (  SARNA) lotion Apply 1 Application topically 2 (two) times daily as needed for itching.   docusate sodium  (COLACE) 100 MG capsule Take 1 capsule (100 mg total) by mouth 2 (two) times daily.   Emollient (CETAPHIL) cream Apply 1 Application topically at bedtime.   escitalopram  (LEXAPRO ) 20 MG tablet Take 20 mg by mouth daily.   ezetimibe  (ZETIA ) 10 MG tablet Take 0.5 mg by mouth daily.   furosemide  (LASIX ) 20 MG tablet Take 20 mg by mouth 3 (three) times a week. In the morning on Monday, Wednesday, and Friday.   haloperidol  (HALDOL ) 1 MG tablet Take 1 mg by mouth 2 (two) times daily as needed for agitation.   haloperidol  lactate (HALDOL ) 5 MG/ML injection Inject 2.5 mg into the muscle daily as needed.   hydrOXYzine  (VISTARIL ) 25 MG capsule Take 25 mg by mouth 3 (three) times daily.   LORazepam  (ATIVAN ) 0.5 MG tablet Take 1 tablet (0.5 mg total) by mouth 3 (three) times daily.   melatonin 5 MG TABS Take 5 mg by mouth at bedtime.   methocarbamol  (ROBAXIN ) 500 MG tablet Take 1 tablet (500 mg total) by mouth every 6 (six) hours  as needed for muscle spasms.   metoprolol  tartrate (LOPRESSOR ) 25 MG tablet Take 25 mg by mouth 2 (two) times daily. Hold for SBP <100   mirtazapine (REMERON SOL-TAB) 15 MG disintegrating tablet Take 15 mg by mouth at bedtime.   Multiple Vitamin (MULTIVITAMIN WITH MINERALS) TABS tablet Take 1 tablet by mouth daily.   nitroGLYCERIN  (NITROSTAT ) 0.4 MG SL tablet Place 0.4 mg under the tongue every 5 (five) minutes as needed for chest pain.   omeprazole  (PRILOSEC) 40 MG capsule Take 40 mg by mouth in the morning. Take on empty stomach 30 minutes before breakfast.   potassium chloride  (KLOR-CON ) 10 MEQ tablet Take 10 mEq by mouth 3 (three) times a week. Monday, Wednesday, and Friday.   risperiDONE  (RISPERDAL ) 0.5 MG tablet Take 1.5 tablets (0.75 mg total) by mouth every morning.   risperiDONE  (RISPERDAL ) 1 MG tablet Take 1 mg by mouth every evening.   rosuvastatin  (CRESTOR ) 5 MG tablet Take 5 mg by mouth every morning.   triamcinolone  cream (KENALOG ) 0.1 % Apply 1 Application topically daily as needed (rash / itching).   No facility-administered encounter medications on file as of 08/09/2024.    Review of Systems  Constitutional: Negative.   Skin:  Positive for color change and wound.    Immunization History  Administered Date(s) Administered   Fluad Quad(high Dose 65+) 07/09/2019, 08/15/2020, 09/16/2021, 07/29/2022, 09/06/2023   INFLUENZA, HIGH DOSE SEASONAL PF 09/16/2015, 08/18/2016, 08/31/2017, 08/14/2018   Influenza Split 08/10/2011, 08/15/2012   Influenza Whole 08/21/2009, 09/22/2010   Influenza,inj,Quad PF,6+ Mos 08/06/2013   Influenza-Unspecified 08/31/2017   Moderna Covid-19 Vaccine  Bivalent Booster 35yrs & up 09/06/2023   PFIZER(Purple Top)SARS-COV-2 Vaccination 12/11/2019, 01/01/2020, 08/12/2020   Pfizer Covid-19 Vaccine Bivalent Booster 24yrs & up 03/11/2023   Pneumococcal Conjugate-13 10/02/2013   Pneumococcal Polysaccharide-23 06/04/2010   Tdap 10/22/2011, 09/22/2022   Zoster  Recombinant(Shingrix) 03/09/2017, 05/10/2017   Pertinent  Health Maintenance Due  Topic Date Due   Influenza Vaccine  06/15/2024      08/15/2023   10:15 AM 08/18/2023    4:26 PM 11/01/2023   12:07 PM 04/24/2024   10:15 AM 07/19/2024    4:39 PM  Fall Risk  Falls in the past year? 0 1 0 1 0  Was there an injury with Fall? 0 1 0 0 0  Fall  Risk Category Calculator 0 3 0 1 0  Patient at Risk for Falls Due to No Fall Risks History of fall(s)  History of fall(s);Impaired balance/gait;Impaired mobility History of fall(s);Impaired balance/gait;Impaired mobility  Fall risk Follow up Falls evaluation completed Falls evaluation completed Falls evaluation completed Falls evaluation completed;Education provided Falls evaluation completed;Education provided   Functional Status Survey:    Vitals:   08/09/24 1754  BP: 118/75  Temp: 98 F (36.7 C)   There is no height or weight on file to calculate BMI. Physical Exam Vitals reviewed.  Constitutional:      Appearance: Normal appearance.  Musculoskeletal:     Right lower leg: Edema (+1) present.     Left lower leg: Edema (+1) present.  Neurological:     Mental Status: He is alert. Mental status is at baseline.  Psychiatric:        Mood and Affect: Mood normal.      Labs reviewed: Recent Labs    01/02/24 0557 01/05/24 1805 01/06/24 0000 02/21/24 1452 04/02/24 0000 06/25/24 0000  NA 135 139   < > 138 139 142  K 3.4* 3.6   < > 3.6 4.1 3.8  CL 105 107   < > 106 106 107  CO2 20* 24   < > 24 21 24*  GLUCOSE 112* 96  --  81  --   --   BUN 21 15   < > 16 19 14   CREATININE 0.76 0.88   < > 0.89 0.8 0.8  CALCIUM  8.4* 8.5*   < > 8.7* 8.5* 8.8  MG 1.9  --   --   --   --   --    < > = values in this interval not displayed.   Recent Labs    01/01/24 0004 01/02/24 0557 02/21/24 1452 04/02/24 0000 06/25/24 0000  AST 24 35 28 17 23   ALT 29 33 31 24 19   ALKPHOS 55 51 60 81 70  BILITOT 0.5 0.5 0.6  --   --   PROT 6.6 6.6 6.7  --   --    ALBUMIN  3.2* 3.0* 3.5 3.4* 3.5   Recent Labs    01/01/24 0004 01/02/24 0557 01/05/24 1805 02/21/24 1435 04/02/24 0000  WBC 6.1 4.2 4.1 7.7 6.8  NEUTROABS 5.0  --  2.0 4.4  --   HGB 11.0* 10.7* 11.7* 12.0* 11.5*  HCT 32.8* 32.2* 34.6* 36.3* 34*  MCV 91.9 92.8 90.8 93.6  --   PLT 227 196 229 283 298   Lab Results  Component Value Date   TSH 1.27 04/02/2024   Lab Results  Component Value Date   HGBA1C 6.4 08/30/2017   Lab Results  Component Value Date   CHOL 82 05/29/2024   HDL 41 05/29/2024   LDLCALC 14 05/29/2024   LDLDIRECT 168.9 05/24/2013   TRIG 130 05/29/2024   CHOLHDL 4.2 08/04/2020    Significant Diagnostic Results in last 30 days:  No results found.  Assessment/Plan 1. Infected skin tear (Primary) Mild Try bactroban  If not improving consider keflex   - mupirocin  ointment (BACTROBAN ) 2 %; Apply 1 Application topically 2 (two) times daily for 7 days.  2. Edema Improved with lasix  three times weekly BMP in am BP ok

## 2024-08-13 DIAGNOSIS — Z79899 Other long term (current) drug therapy: Secondary | ICD-10-CM | POA: Diagnosis not present

## 2024-08-13 LAB — BASIC METABOLIC PANEL WITH GFR
BUN: 14 (ref 4–21)
CO2: 23 — AB (ref 13–22)
Chloride: 105 (ref 99–108)
Creatinine: 0.8 (ref 0.6–1.3)
Glucose: 103
Potassium: 3.9 meq/L (ref 3.5–5.1)
Sodium: 139 (ref 137–147)

## 2024-08-13 LAB — COMPREHENSIVE METABOLIC PANEL WITH GFR
Calcium: 8.9 (ref 8.7–10.7)
eGFR: 85

## 2024-08-14 DIAGNOSIS — Z23 Encounter for immunization: Secondary | ICD-10-CM | POA: Diagnosis not present

## 2024-08-16 ENCOUNTER — Non-Acute Institutional Stay (SKILLED_NURSING_FACILITY): Payer: Self-pay | Admitting: Adult Health

## 2024-08-16 DIAGNOSIS — F02C3 Dementia in other diseases classified elsewhere, severe, with mood disturbance: Secondary | ICD-10-CM

## 2024-08-16 DIAGNOSIS — G308 Other Alzheimer's disease: Secondary | ICD-10-CM | POA: Diagnosis not present

## 2024-08-16 MED ORDER — LORAZEPAM 0.5 MG PO TABS: 0.5000 mg | ORAL_TABLET | Freq: Two times a day (BID) | ORAL | 2 refills | Status: DC

## 2024-08-16 MED ORDER — LORAZEPAM 1 MG PO TABS: 1.0000 mg | ORAL_TABLET | Freq: Every evening | ORAL | 2 refills | Status: AC

## 2024-08-17 ENCOUNTER — Encounter: Payer: Self-pay | Admitting: Adult Health
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
## 2024-08-27 ENCOUNTER — Non-Acute Institutional Stay (SKILLED_NURSING_FACILITY): Payer: Self-pay | Admitting: Internal Medicine

## 2024-08-27 ENCOUNTER — Encounter: Payer: Self-pay | Admitting: Internal Medicine

## 2024-08-27 DIAGNOSIS — F4321 Adjustment disorder with depressed mood: Secondary | ICD-10-CM | POA: Diagnosis not present

## 2024-08-27 DIAGNOSIS — I10 Essential (primary) hypertension: Secondary | ICD-10-CM

## 2024-08-27 DIAGNOSIS — F03911 Unspecified dementia, unspecified severity, with agitation: Secondary | ICD-10-CM | POA: Diagnosis not present

## 2024-08-27 DIAGNOSIS — I2510 Atherosclerotic heart disease of native coronary artery without angina pectoris: Secondary | ICD-10-CM | POA: Diagnosis not present

## 2024-08-27 DIAGNOSIS — I480 Paroxysmal atrial fibrillation: Secondary | ICD-10-CM | POA: Diagnosis not present

## 2024-08-27 DIAGNOSIS — F02C3 Dementia in other diseases classified elsewhere, severe, with mood disturbance: Secondary | ICD-10-CM | POA: Diagnosis not present

## 2024-08-27 DIAGNOSIS — E782 Mixed hyperlipidemia: Secondary | ICD-10-CM

## 2024-08-27 DIAGNOSIS — M069 Rheumatoid arthritis, unspecified: Secondary | ICD-10-CM

## 2024-08-27 NOTE — Progress Notes (Signed)
 Location:  [facility]   Nursing Home Room Number: 124-P Place of Service:  SNF [phone]) Provider:  Charlanne Fredia CROME, MD  Charlanne Fredia CROME, MD  Patient Care Team: Charlanne Fredia CROME, MD as PCP - General (Internal Medicine) Shlomo Wilbert SAUNDERS, MD as PCP - Cardiology (Cardiology) Kelsie Agent, MD (Inactive) as PCP - Electrophysiology (Cardiology) Abran Norleen SAILOR, MD (Gastroenterology) Kelsie Agent, MD (Inactive) (Cardiology) Mai Agent FALCON, MD as Consulting Physician (Rheumatology) Alvaro Ricardo KATHEE Raddle., MD as Consulting Physician (Urology) Charmayne Molly, MD as Consulting Physician (Ophthalmology) Carolee Clap (Dentistry) Cheryn Nickels, MD as Referring Physician (Allergy and Immunology) Georjean Darice HERO, MD as Consulting Physician (Neurology) Fate Morna SAILOR, [facility] (Inactive) as Pharmacist (Pharmacist) Georjean Darice HERO, MD as Consulting Physician (Neurology)  Extended Emergency Contact Information Primary Emergency Contact: Barnaby,[name] Address: [address]          [place], [place] Home Phone: [phone] Work Phone: [phone] Mobile Phone: [phone] Relation: Spouse Secondary Emergency Contact: [name],[name] Home Phone: [phone] Mobile Phone: [phone] Relation: Daughter  Code Status: DNR Goals of care: Advanced Directive information    08/27/2024    2:39 PM  Advanced Directives  Does Patient Have a Medical Advance Directive? Yes  Type of Estate agent of [place];Out of facility DNR (pink MOST or yellow form);Living will  Does patient want to make changes to medical advance directive? No - Patient declined  Copy of Healthcare Power of Attorney in Chart? Yes - validated most recent copy scanned in chart (See row information)     Chief Complaint  Patient presents with   Medical Management of Chronic Issues    Routine visit     HPI:  Pt is a 89 y.o. male seen today for medical  management of chronic diseases.    Lives in SNF in [place]   Patient has h/o Dementia with behavior issues, Rheumatoid Arthritis, A Fib, HTN, CAD s/p PCI, GERD and Sleep apnea And Depression    Continues to need Haldol  Prn and vistaril  for his behaviors especially with Night shift Nurses Is hoyer dependent Responds and Follows commands but now has worsening Aphasia Can eat himself but does it very slowly  Wt Readings from Last 3 Encounters:  08/27/24 197 lb 4.8 oz (89.5 kg)  08/17/24 197 lb 4.8 oz (89.5 kg)  07/18/24 197 lb 9.6 oz (89.6 kg)    Past Medical History:  Diagnosis Date   CAD (coronary artery disease)    a. s/p inferior STEMI 01/08/2014; LHC 01/08/14: total RCA occlusion s/p 3.5x62mm Xience DES distal RCA and 3.5x28 mm DES mid RCA, 60-70% mid LAD stenosis, EF 55%.   Complication of anesthesia    'long to wake up after back surgery  09/2003   Diverticulosis of colon    GERD (gastroesophageal reflux disease)    History of cellulitis    05-26-2015  LLE   History of colon polyps    1998- benign/  2008 adenomatous    History of kidney stones    2013   History of squamous cell carcinoma excision    2013;  2015;  06-12-2015 right leg/  02/ and 05/ 2017  left ear and left leg   Hydronephrosis, left    Hyperlipidemia    Hypertension    Migraine with aura    OSA on CPAP 06/19/2015   Moderate OSA with AHI 18/hr  per study 05-20-2015   Osteoarthritis    Paroxysmal atrial fibrillation (HCC) 4/16  chads2vasc score is at least 4   Premature atrial contractions    RA (rheumatoid arthritis) [facility])    rheumatologist-  dr lynwood ramsay   Sinusitis, chronic 10/17/2015   Wears glasses    Wears hearing aid    bilateral   Past Surgical History:  Procedure Laterality Date   BACK SURGERY     CARDIOVASCULAR STRESS TEST  11/21/2015   Low risk nuclear study w/ a small diaphragmatic attenuation artifact, no ischemia/  normal LV function and wall motion , ef 63%   CATARACT EXTRACTION W/  INTRAOCULAR LENS  IMPLANT, BILATERAL  2015   COLONOSCOPY  last one 06-09-2011   CORONARY ANGIOPLASTY     CYSTOSCOPY W/ RETROGRADES Left 07/12/2016   Procedure: CYSTOSCOPY WITH RETROGRADE PYELOGRAM LEFT URETERAL STENT;  Surgeon: Norleen Seltzer, MD;  Location: [facility];  Service: Urology;  Laterality: Left;   CYSTOSCOPY WITH RETROGRADE PYELOGRAM, URETEROSCOPY AND STENT PLACEMENT Left 08/11/2016   Procedure: CYSTOSCOPY WITH RETROGRADE PYELOGRAM,  DIAGNOSTIC URETEROSCOPY , STENT EXCHANGE;  Surgeon: Ricardo Likens, MD;  Location: [facility];  Service: Urology;  Laterality: Left;   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   severe fibromatosis palm and fingers   LEFT HEART CATHETERIZATION WITH CORONARY ANGIOGRAM N/A 01/08/2014   Procedure: LEFT HEART CATHETERIZATION WITH CORONARY ANGIOGRAM;  Surgeon: Debby DELENA Sor, MD;  Location: [facility] CATH LAB;  Service: Cardiovascular;  Laterality:N/A;  total/ subtotal RCA/  mLAD 60-70% w/ mid systolic bridging/  preserved global LVF, ef 55%   MOHS SURGERY  x2  feb and may 2017   left ear /  left leg  (SCC)   ORIF ACETABULAR FRACTURE Right 03/29/2023   Procedure: OPEN REDUCTION INTERNAL FIXATION (ORIF) ACETABULAR FRACTURE;  Surgeon: Celena Sharper, MD;  Location: [facility] OR;  Service: Orthopedics;  Laterality: Right;   PERCUTANEOUS CORONARY STENT INTERVENTION (PCI-S)  01/08/2014   Procedure: PERCUTANEOUS CORONARY STENT INTERVENTION (PCI-S);  Surgeon: Debby DELENA Sor, MD;  Location: [facility] CATH LAB;  Service: Cardiovascular;;  DES to mid and distal RCA   POSTERIOR LUMBAR FUSION  10/01/2003   and Laminectomy/ diskectomy  L4 -- S1   ROBOT ASSISTED PYELOPLASTY Left 09/15/2016   Procedure: XI ROBOTIC ASSISTED PYELOPLASTY;  Surgeon: Ricardo Likens, MD;  Location: [facility];  Service: Urology;  Laterality: Left;   TONSILLECTOMY     TRANSTHORACIC ECHOCARDIOGRAM  02/23/2016   mild LVH,  ef 50-55%,  grade 1 diastolic dysfunction/  mild to moderate AV calcification w/ no stenosis  or regurg./  trivial MR and TR/  mild PR    Allergies  Allergen Reactions   Methotrexate Other (See Comments)    REACTION: tachycardia   Aricept  [Donepezil  Hcl] Other (See Comments)    Nightmares. Pt takes this medication per [facility]    Crestor  [Rosuvastatin  Calcium ] Other (See Comments)    Myalgias with 20mg  dose   Lisinopril  Other (See Comments)    cough   Namenda  [Memantine ] Diarrhea and Nausea Only   Atorvastatin  Other (See Comments)    Muscle pain, leg cramps   Depakote  [Divalproex  Sodium] Rash    Allergy not listed on MAR    Pravastatin Sodium Other (See Comments)    REACTION: cramps, fatigue    Allergies as of 08/27/2024       Reactions   Methotrexate Other (See Comments)   REACTION: tachycardia   Aricept  [donepezil  Hcl] Other (See Comments)   Nightmares. Pt takes this medication per MAR    Crestor  [rosuvastatin  Calcium ] Other (See Comments)   Myalgias  with 20mg  dose   Lisinopril  Other (See Comments)   cough   Namenda  [memantine ] Diarrhea, Nausea Only   Atorvastatin  Other (See Comments)   Muscle pain, leg cramps   Depakote  [divalproex  Sodium] Rash   Allergy not listed on MAR    Pravastatin Sodium Other (See Comments)   REACTION: cramps, fatigue        Medication List        Accurate as of August 27, 2024 11:59 PM. If you have any questions, ask your nurse or doctor.          acetaminophen  325 MG tablet Commonly known as: Tylenol  Take 2 tablets (650 mg total) by mouth in the morning and at bedtime.   apixaban  5 MG Tabs tablet Commonly known as: Eliquis  Take 1 tablet (5 mg total) by mouth 2 (two) times daily.   camphor-menthol lotion Commonly known as: SARNA Apply 1 Application topically 2 (two) times daily as needed for itching.   cetaphil cream Apply 1 Application topically at bedtime.   docusate sodium  100 MG capsule Commonly known as: COLACE Take 1 capsule (100 mg total) by mouth 2 (two) times daily.   escitalopram  20 MG tablet Commonly  known as: LEXAPRO  Take 20 mg by mouth daily.   ezetimibe  10 MG tablet Commonly known as: ZETIA  Take 0.5 mg by mouth daily.   furosemide  20 MG tablet Commonly known as: LASIX  Take 20 mg by mouth 3 (three) times a week. In the morning on Monday, Wednesday, and Friday.   haloperidol  1 MG tablet Commonly known as: HALDOL  Take 2 mg by mouth 2 (two) times daily as needed for agitation.   haloperidol  lactate 5 MG/ML injection Commonly known as: HALDOL  Inject 2.5 mg into the muscle daily as needed.   hydrOXYzine  25 MG capsule Commonly known as: VISTARIL  Take 25 mg by mouth 3 (three) times daily.   LORazepam  1 MG tablet Commonly known as: ATIVAN  Take 1 tablet (1 mg total) by mouth every evening.   LORazepam  0.5 MG tablet Commonly known as: ATIVAN  Take 1 tablet (0.5 mg total) by mouth 2 (two) times daily. Give in the morning and mid day   melatonin 5 MG Tabs Take 5 mg by mouth at bedtime.   methocarbamol  500 MG tablet Commonly known as: ROBAXIN  Take 1 tablet (500 mg total) by mouth every 6 (six) hours as needed for muscle spasms.   metoprolol  tartrate 25 MG tablet Commonly known as: LOPRESSOR  Take 25 mg by mouth 2 (two) times daily. Hold for SBP <100   mirtazapine 15 MG disintegrating tablet Commonly known as: REMERON SOL-TAB Take 15 mg by mouth at bedtime.   multivitamin with minerals Tabs tablet Take 1 tablet by mouth daily.   nitroGLYCERIN  0.4 MG SL tablet Commonly known as: NITROSTAT  Place 0.4 mg under the tongue every 5 (five) minutes as needed for chest pain.   omeprazole  40 MG capsule Commonly known as: PRILOSEC Take 40 mg by mouth in the morning. Take on empty stomach 30 minutes before breakfast.   potassium chloride  10 MEQ tablet Commonly known as: KLOR-CON  Take 10 mEq by mouth 3 (three) times a week. Monday, Wednesday, and Friday.   risperiDONE  1 MG tablet Commonly known as: RISPERDAL  Take 2 mg by mouth every evening. What changed: Another medication  with the same name was changed. Make sure you understand how and when to take each. Changed by: Fredia LITTIE Bring   risperiDONE  1 MG tablet Commonly known as: RISPERDAL  Take 1 tablet (1  mg total) by mouth every morning. What changed:  medication strength how much to take Changed by: Fredia LITTIE Bring   rosuvastatin  5 MG tablet Commonly known as: CRESTOR  Take 5 mg by mouth every morning.   triamcinolone  cream 0.1 % Commonly known as: KENALOG  Apply 1 Application topically daily as needed (rash / itching).        Review of Systems  Unable to perform ROS: Dementia    Immunization History  Administered Date(s) Administered   Fluad Quad(high Dose 65+) 07/09/2019, 08/15/2020, 09/16/2021, 07/29/2022, 09/06/2023   INFLUENZA, HIGH DOSE SEASONAL PF 09/16/2015, 08/18/2016, 08/31/2017, 08/14/2018   Influenza Split 08/10/2011, 08/15/2012   Influenza Whole 08/21/2009, 09/22/2010   Influenza,inj,Quad PF,6+ Mos 08/06/2013   Influenza-Unspecified 08/31/2017, 08/14/2024   Moderna Covid-19 Vaccine  Bivalent Booster 49yrs & up 09/06/2023   PFIZER(Purple Top)SARS-COV-2 Vaccination 12/11/2019, 01/01/2020, 08/12/2020   Pfizer Covid-19 Vaccine Bivalent Booster 74yrs & up 03/11/2023   Pfizer(Comirnaty)Fall Seasonal Vaccine 12 years and older 08/14/2024   Pneumococcal Conjugate-13 10/02/2013   Pneumococcal Polysaccharide-23 06/04/2010   Tdap 10/22/2011, 09/22/2022   Zoster Recombinant(Shingrix) 03/09/2017, 05/10/2017   Pertinent  Health Maintenance Due  Topic Date Due   Influenza Vaccine  Completed      08/15/2023   10:15 AM 08/18/2023    4:26 PM 11/01/2023   12:07 PM 04/24/2024   10:15 AM 07/19/2024    4:39 PM  Fall Risk  Falls in the past year? 0 1 0 1 0  Was there an injury with Fall? 0 1 0 0 0  Fall Risk Category Calculator 0 3 0 1 0  Patient at Risk for Falls Due to No Fall Risks History of fall(s)  History of fall(s);Impaired balance/gait;Impaired mobility History of fall(s);Impaired  balance/gait;Impaired mobility  Fall risk Follow up Falls evaluation completed Falls evaluation completed Falls evaluation completed Falls evaluation completed;Education provided Falls evaluation completed;Education provided   Functional Status Survey:    Vitals:   08/27/24 1437  BP: 127/78  Pulse: 61  Resp: 20  Temp: (!) 97 F (36.1 C)  SpO2: 95%  Weight: 197 lb 4.8 oz (89.5 kg)   Body mass index is 26.76 kg/m. Physical Exam Vitals reviewed.  Constitutional:      Appearance: Normal appearance.     Comments: More sleepy today  HENT:     Head: Normocephalic.     Nose: Nose normal.     Mouth/Throat:     Mouth: Mucous membranes are moist.     Pharynx: Oropharynx is clear.  Eyes:     Pupils: Pupils are equal, round, and reactive to light.  Cardiovascular:     Rate and Rhythm: Normal rate and regular rhythm.     Pulses: Normal pulses.     Heart sounds: No murmur heard. Pulmonary:     Effort: Pulmonary effort is normal. No respiratory distress.     Breath sounds: Normal breath sounds. No rales.  Abdominal:     General: Abdomen is flat. Bowel sounds are normal.     Palpations: Abdomen is soft.  Musculoskeletal:        General: No swelling.     Cervical back: Neck supple.  Skin:    General: Skin is warm.  Neurological:     Mental Status: He is alert.  Psychiatric:        Mood and Affect: Mood normal.        Thought Content: Thought content normal.     Labs reviewed: Recent Labs    01/02/24 0557 01/05/24 1805  01/06/24 0000 02/21/24 1452 04/02/24 0000 06/25/24 0000  NA 135 139   < > 138 139 142  K 3.4* 3.6   < > 3.6 4.1 3.8  CL 105 107   < > 106 106 107  CO2 20* 24   < > 24 21 24*  GLUCOSE 112* 96  --  81  --   --   BUN 21 15   < > 16 19 14   CREATININE 0.76 0.88   < > 0.89 0.8 0.8  CALCIUM  8.4* 8.5*   < > 8.7* 8.5* 8.8  MG 1.9  --   --   --   --   --    < > = values in this interval not displayed.   Recent Labs    01/01/24 0004 01/02/24 0557  02/21/24 1452 04/02/24 0000 06/25/24 0000  AST 24 35 28 17 23   ALT 29 33 31 24 19   ALKPHOS 55 51 60 81 70  BILITOT 0.5 0.5 0.6  --   --   PROT 6.6 6.6 6.7  --   --   ALBUMIN  3.2* 3.0* 3.5 3.4* 3.5   Recent Labs    01/01/24 0004 01/02/24 0557 01/05/24 1805 02/21/24 1435 04/02/24 0000  WBC 6.1 4.2 4.1 7.7 6.8  NEUTROABS 5.0  --  2.0 4.4  --   HGB 11.0* 10.7* 11.7* 12.0* 11.5*  HCT 32.8* 32.2* 34.6* 36.3* 34*  MCV 91.9 92.8 90.8 93.6  --   PLT 227 196 229 283 298   Lab Results  Component Value Date   TSH 1.27 04/02/2024   Lab Results  Component Value Date   HGBA1C 6.4 08/30/2017   Lab Results  Component Value Date   CHOL 82 05/29/2024   HDL 41 05/29/2024   LDLCALC 14 05/29/2024   LDLDIRECT 168.9 05/24/2013   TRIG 130 05/29/2024   CHOLHDL 4.2 08/04/2020    Significant Diagnostic Results in last 30 days:  No results found.  Assessment/Plan 1. 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 Paroxysmal atrial fibrillation (HCC) Eliquis  and Metoprolol   3. Coronary artery disease On statin and Eliquis   4. Mixed hyperlipidemia Crestor  Discontinued Repatha  due to low LDL  5. Rheumatoid arthritis Off All meds No Symptoms 6. Primary hypertension Metoprolol   7. Situational depression Lexapro  Remeron and Ativan   8 GERD Prilosec 9 LE edema On Lasix    Family/ staff Communication:   Labs/tests ordered:

## 2024-08-30 DIAGNOSIS — F4323 Adjustment disorder with mixed anxiety and depressed mood: Secondary | ICD-10-CM | POA: Diagnosis not present

## 2024-09-06 MED ORDER — RISPERIDONE 1 MG PO TABS: 1.0000 mg | ORAL_TABLET | Freq: Every morning | ORAL | Status: DC

## 2024-09-18 ENCOUNTER — Non-Acute Institutional Stay: Payer: Self-pay | Admitting: Orthopedic Surgery

## 2024-09-18 ENCOUNTER — Encounter: Payer: Self-pay | Admitting: Orthopedic Surgery

## 2024-09-18 DIAGNOSIS — I480 Paroxysmal atrial fibrillation: Secondary | ICD-10-CM

## 2024-09-18 DIAGNOSIS — M069 Rheumatoid arthritis, unspecified: Secondary | ICD-10-CM | POA: Diagnosis not present

## 2024-09-18 DIAGNOSIS — I2510 Atherosclerotic heart disease of native coronary artery without angina pectoris: Secondary | ICD-10-CM | POA: Diagnosis not present

## 2024-09-18 DIAGNOSIS — I10 Essential (primary) hypertension: Secondary | ICD-10-CM

## 2024-09-18 DIAGNOSIS — G308 Other Alzheimer's disease: Secondary | ICD-10-CM | POA: Diagnosis not present

## 2024-09-18 DIAGNOSIS — E782 Mixed hyperlipidemia: Secondary | ICD-10-CM

## 2024-09-18 DIAGNOSIS — F3341 Major depressive disorder, recurrent, in partial remission: Secondary | ICD-10-CM | POA: Diagnosis not present

## 2024-09-18 DIAGNOSIS — F02C11 Dementia in other diseases classified elsewhere, severe, with agitation: Secondary | ICD-10-CM | POA: Diagnosis not present

## 2024-09-18 NOTE — Progress Notes (Signed)
 Location:   [facility]   Place of Service:   [facility] Provider:  Greig FORBES Cluster, NP   Charlanne Fredia CROME, MD  Patient Care Team: Charlanne Fredia CROME, MD as PCP - General (Internal Medicine) Shlomo Wilbert SAUNDERS, MD as PCP - Cardiology (Cardiology) Kelsie Agent, MD (Inactive) as PCP - Electrophysiology (Cardiology) Abran Norleen SAILOR, MD (Gastroenterology) Kelsie Agent, MD (Inactive) (Cardiology) Mai Agent FALCON, MD as Consulting Physician (Rheumatology) Alvaro Ricardo KATHEE Raddle., MD as Consulting Physician (Urology) Charmayne Molly, MD as Consulting Physician (Ophthalmology) Carolee Clap (Dentistry) Cheryn Nickels, MD as Referring Physician (Allergy and Immunology) Georjean Darice HERO, MD as Consulting Physician (Neurology) Fate Morna SAILOR, [facility] (Inactive) as Pharmacist (Pharmacist) Georjean Darice HERO, MD as Consulting Physician (Neurology)  Extended Emergency Contact Information Primary Emergency Contact: Weier,[name] Address: [address]          [place], [place] Home Phone: [phone] Work Phone: [phone] Mobile Phone: [phone] Relation: Spouse Secondary Emergency Contact: [name],[name] Home Phone: [phone] Mobile Phone: [phone] Relation: Daughter  Code Status:  DNR Goals of care: Advanced Directive information    08/27/2024    2:39 PM  Advanced Directives  Does Patient Have a Medical Advance Directive? Yes  Type of Estate Agent of [place];Out of facility DNR (pink MOST or yellow form);Living will  Does patient want to make changes to medical advance directive? No - Patient declined  Copy of Healthcare Power of Attorney in Chart? Yes - validated most recent copy scanned in chart (See row information)     Chief Complaint  Patient presents with   Medical Management of Chronic Issues    HPI:  Pt is a 89 y.o. male seen today for medical management of chronic diseases.    He currently  resides on the skilled nursing unit due to Alzheimer's dementia. PMH: CAD s/p STEMI 2015, HTN, HLD, PAF, GERD, frequent falls s/p subdural hematoma 09/2022, left hydronephrosis, RA, migraine, OSA, OA, depression and insomnia.    HTN- BUN/creat 14/0.8 06/25/2024, remains on metoprolol  HLD- total cholesterol 82, LDL 14 05/29/2024, off Repatha  due to low LDL, remains on Zetia  and Crestor  CAD- see above PAF- rate controlled with metoprolol , on Eliquis  for clot prevention, TSH 1.27 04/02/2024 Alzheimer's dementia- CT head noted chronic microvascular ischemic disease, followed by Dr. Tasia due to agitation, continues to have worsening behaviors in afternoon/evening, ambulates with Juditta,dependent with ADLs, remains on ativan , risperidone , hydroxyzine  and Haldol  Depression- see above, remains on mirtazapine and escitalopram , Na+ 142 06/25/2024 RA- off Orencia , remains on tylenol    Recent weights:  11/01- 200 lbs  10/01- 197.3 lbs  09/01- 197.6 lbs  Recent blood pressures:  11/02- 124/78, 143/83  11/01- 136/73, 113/54, 111/71   Past Medical History:  Diagnosis Date   CAD (coronary artery disease)    a. s/p inferior STEMI 01/08/2014; LHC 01/08/14: total RCA occlusion s/p 3.5x39mm Xience DES distal RCA and 3.5x28 mm DES mid RCA, 60-70% mid LAD stenosis, EF 55%.   Complication of anesthesia    'long to wake up after back surgery  09/2003   Diverticulosis of colon    GERD (gastroesophageal reflux disease)    History of cellulitis    05-26-2015  LLE   History of colon polyps    1998- benign/  2008 adenomatous    History of kidney stones    2013   History of squamous cell carcinoma excision    2013;  2015;  06-12-2015 right leg/  02/ and  05/ 2017  left ear and left leg   Hydronephrosis, left    Hyperlipidemia    Hypertension    Migraine with aura    OSA on CPAP 06/19/2015   Moderate OSA with AHI 18/hr  per study 05-20-2015   Osteoarthritis    Paroxysmal atrial fibrillation (HCC) 4/16    chads2vasc score is at least 4   Premature atrial contractions    RA (rheumatoid arthritis) [facility])    rheumatologist-  dr lynwood ramsay   Sinusitis, chronic 10/17/2015   Wears glasses    Wears hearing aid    bilateral   Past Surgical History:  Procedure Laterality Date   BACK SURGERY     CARDIOVASCULAR STRESS TEST  11/21/2015   Low risk nuclear study w/ a small diaphragmatic attenuation artifact, no ischemia/  normal LV function and wall motion , ef 63%   CATARACT EXTRACTION W/ INTRAOCULAR LENS  IMPLANT, BILATERAL  2015   COLONOSCOPY  last one 06-09-2011   CORONARY ANGIOPLASTY     CYSTOSCOPY W/ RETROGRADES Left 07/12/2016   Procedure: CYSTOSCOPY WITH RETROGRADE PYELOGRAM LEFT URETERAL STENT;  Surgeon: Norleen Seltzer, MD;  Location: [facility];  Service: Urology;  Laterality: Left;   CYSTOSCOPY WITH RETROGRADE PYELOGRAM, URETEROSCOPY AND STENT PLACEMENT Left 08/11/2016   Procedure: CYSTOSCOPY WITH RETROGRADE PYELOGRAM,  DIAGNOSTIC URETEROSCOPY , STENT EXCHANGE;  Surgeon: Ricardo Likens, MD;  Location: [facility];  Service: Urology;  Laterality: Left;   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   severe fibromatosis palm and fingers   LEFT HEART CATHETERIZATION WITH CORONARY ANGIOGRAM N/A 01/08/2014   Procedure: LEFT HEART CATHETERIZATION WITH CORONARY ANGIOGRAM;  Surgeon: Debby DELENA Sor, MD;  Location: [facility] CATH LAB;  Service: Cardiovascular;  Laterality:N/A;  total/ subtotal RCA/  mLAD 60-70% w/ mid systolic bridging/  preserved global LVF, ef 55%   MOHS SURGERY  x2  feb and may 2017   left ear /  left leg  ([facility])   ORIF ACETABULAR FRACTURE Right 03/29/2023   Procedure: OPEN REDUCTION INTERNAL FIXATION (ORIF) ACETABULAR FRACTURE;  Surgeon: Celena Sharper, MD;  Location: [facility] OR;  Service: Orthopedics;  Laterality: Right;   PERCUTANEOUS CORONARY STENT INTERVENTION (PCI-S)  01/08/2014   Procedure: PERCUTANEOUS CORONARY STENT INTERVENTION (PCI-S);  Surgeon: Debby DELENA Sor, MD;   Location: [facility] CATH LAB;  Service: Cardiovascular;;  DES to mid and distal RCA   POSTERIOR LUMBAR FUSION  10/01/2003   and Laminectomy/ diskectomy  L4 -- S1   ROBOT ASSISTED PYELOPLASTY Left 09/15/2016   Procedure: XI ROBOTIC ASSISTED PYELOPLASTY;  Surgeon: Ricardo Likens, MD;  Location: [facility];  Service: Urology;  Laterality: Left;   TONSILLECTOMY     TRANSTHORACIC ECHOCARDIOGRAM  02/23/2016   mild LVH,  ef 50-55%,  grade 1 diastolic dysfunction/  mild to moderate AV calcification w/ no stenosis or regurg./  trivial MR and TR/  mild PR    Allergies  Allergen Reactions   Methotrexate Other (See Comments)    REACTION: tachycardia   Aricept  [Donepezil  Hcl] Other (See Comments)    Nightmares. Pt takes this medication per [facility]    Crestor  [Rosuvastatin  Calcium ] Other (See Comments)    Myalgias with 20mg  dose   Lisinopril  Other (See Comments)    cough   Namenda  [Memantine ] Diarrhea and Nausea Only   Atorvastatin  Other (See Comments)    Muscle pain, leg cramps   Depakote  [Divalproex  Sodium] Rash    Allergy not listed on MAR    Pravastatin Sodium Other (See Comments)  REACTION: cramps, fatigue    Outpatient Encounter Medications as of 09/18/2024  Medication Sig   acetaminophen  (TYLENOL ) 325 MG tablet Take 2 tablets (650 mg total) by mouth in the morning and at bedtime.   apixaban  (ELIQUIS ) 5 MG TABS tablet Take 1 tablet (5 mg total) by mouth 2 (two) times daily.   camphor-menthol (SARNA) lotion Apply 1 Application topically 2 (two) times daily as needed for itching.   docusate sodium  (COLACE) 100 MG capsule Take 1 capsule (100 mg total) by mouth 2 (two) times daily.   Emollient (CETAPHIL) cream Apply 1 Application topically at bedtime.   escitalopram  (LEXAPRO ) 20 MG tablet Take 20 mg by mouth daily.   ezetimibe  (ZETIA ) 10 MG tablet Take 0.5 mg by mouth daily.   furosemide  (LASIX ) 20 MG tablet Take 20 mg by mouth 3 (three) times a week. In the morning on Monday, Wednesday, and Friday.    haloperidol  (HALDOL ) 1 MG tablet Take 2 mg by mouth 2 (two) times daily as needed for agitation.   haloperidol  lactate (HALDOL ) 5 MG/ML injection Inject 2.5 mg into the muscle daily as needed.   hydrOXYzine  (VISTARIL ) 25 MG capsule Take 25 mg by mouth 3 (three) times daily.   LORazepam  (ATIVAN ) 0.5 MG tablet Take 1 tablet (0.5 mg total) by mouth 2 (two) times daily. Give in the morning and mid day   LORazepam  (ATIVAN ) 1 MG tablet Take 1 tablet (1 mg total) by mouth every evening.   melatonin 5 MG TABS Take 5 mg by mouth at bedtime.   methocarbamol  (ROBAXIN ) 500 MG tablet Take 1 tablet (500 mg total) by mouth every 6 (six) hours as needed for muscle spasms.   metoprolol  tartrate (LOPRESSOR ) 25 MG tablet Take 25 mg by mouth 2 (two) times daily. Hold for SBP <100   mirtazapine (REMERON SOL-TAB) 15 MG disintegrating tablet Take 15 mg by mouth at bedtime.   Multiple Vitamin (MULTIVITAMIN WITH MINERALS) TABS tablet Take 1 tablet by mouth daily.   nitroGLYCERIN  (NITROSTAT ) 0.4 MG SL tablet Place 0.4 mg under the tongue every 5 (five) minutes as needed for chest pain.   omeprazole  (PRILOSEC) 40 MG capsule Take 40 mg by mouth in the morning. Take on empty stomach 30 minutes before breakfast.   potassium chloride  (KLOR-CON ) 10 MEQ tablet Take 10 mEq by mouth 3 (three) times a week. Monday, Wednesday, and Friday.   risperiDONE  (RISPERDAL ) 1 MG tablet Take 2 mg by mouth every evening.   risperiDONE  (RISPERDAL ) 1 MG tablet Take 1 tablet (1 mg total) by mouth every morning.   rosuvastatin  (CRESTOR ) 5 MG tablet Take 5 mg by mouth every morning.   triamcinolone  cream (KENALOG ) 0.1 % Apply 1 Application topically daily as needed (rash / itching).   No facility-administered encounter medications on file as of 09/18/2024.    Review of Systems  Unable to perform ROS: Dementia    Immunization History  Administered Date(s) Administered   Fluad Quad(high Dose 65+) 07/09/2019, 08/15/2020, 09/16/2021, 07/29/2022,  09/06/2023   INFLUENZA, HIGH DOSE SEASONAL PF 09/16/2015, 08/18/2016, 08/31/2017, 08/14/2018   Influenza Split 08/10/2011, 08/15/2012   Influenza Whole 08/21/2009, 09/22/2010   Influenza,inj,Quad PF,6+ Mos 08/06/2013   Influenza-Unspecified 08/31/2017, 08/14/2024   Moderna Covid-19 Vaccine  Bivalent Booster 3yrs & up 09/06/2023   PFIZER(Purple Top)SARS-COV-2 Vaccination 12/11/2019, 01/01/2020, 08/12/2020   Pfizer Covid-19 Vaccine Bivalent Booster 92yrs & up 03/11/2023   Pfizer(Comirnaty)Fall Seasonal Vaccine 12 years and older 08/14/2024   Pneumococcal Conjugate-13 10/02/2013   Pneumococcal Polysaccharide-23 06/04/2010  Tdap 10/22/2011, 09/22/2022   Zoster Recombinant(Shingrix) 03/09/2017, 05/10/2017   Pertinent  Health Maintenance Due  Topic Date Due   Influenza Vaccine  Completed      08/15/2023   10:15 AM 08/18/2023    4:26 PM 11/01/2023   12:07 PM 04/24/2024   10:15 AM 07/19/2024    4:39 PM  Fall Risk  Falls in the past year? 0 1 0 1 0  Was there an injury with Fall? 0 1 0 0 0  Fall Risk Category Calculator 0 3 0 1 0  Patient at Risk for Falls Due to No Fall Risks History of fall(s)  History of fall(s);Impaired balance/gait;Impaired mobility History of fall(s);Impaired balance/gait;Impaired mobility  Fall risk Follow up Falls evaluation completed Falls evaluation completed Falls evaluation completed Falls evaluation completed;Education provided Falls evaluation completed;Education provided   Functional Status Survey:    Vitals:   09/18/24 1522  BP: 132/78  Pulse: 60  Resp: 16  Temp: 98.1 F (36.7 C)  SpO2: 92%  Weight: 200 lb (90.7 kg)  Height: 6' (1.829 m)   Body mass index is 27.12 kg/m. Physical Exam Vitals reviewed.  Constitutional:      General: He is not in acute distress. HENT:     Head: Normocephalic.     Right Ear: There is no impacted cerumen.     Left Ear: There is no impacted cerumen.     Nose: Nose normal.     Mouth/Throat:     Mouth: Mucous  membranes are moist.  Eyes:     General:        Right eye: No discharge.        Left eye: No discharge.  Cardiovascular:     Rate and Rhythm: Normal rate and regular rhythm.     Pulses: Normal pulses.     Heart sounds: Normal heart sounds.  Pulmonary:     Effort: Pulmonary effort is normal. No respiratory distress.     Breath sounds: Normal breath sounds. No wheezing or rales.  Abdominal:     General: Bowel sounds are normal. There is no distension.     Palpations: Abdomen is soft.     Tenderness: There is no abdominal tenderness.  Musculoskeletal:     Cervical back: Neck supple.     Right lower leg: No edema.     Left lower leg: No edema.  Skin:    General: Skin is warm.     Capillary Refill: Capillary refill takes less than 2 seconds.  Neurological:     General: No focal deficit present.     Mental Status: He is alert and easily aroused. Mental status is at baseline.     Gait: Gait abnormal.  Psychiatric:     Comments: Alert to self, does not follow commands     Labs reviewed: Recent Labs    01/02/24 0557 01/05/24 1805 01/06/24 0000 02/21/24 1452 04/02/24 0000 06/25/24 0000  NA 135 139   < > 138 139 142  K 3.4* 3.6   < > 3.6 4.1 3.8  CL 105 107   < > 106 106 107  CO2 20* 24   < > 24 21 24*  GLUCOSE 112* 96  --  81  --   --   BUN 21 15   < > 16 19 14   CREATININE 0.76 0.88   < > 0.89 0.8 0.8  CALCIUM  8.4* 8.5*   < > 8.7* 8.5* 8.8  MG 1.9  --   --   --   --   --    < > =  values in this interval not displayed.   Recent Labs    01/01/24 0004 01/02/24 0557 02/21/24 1452 04/02/24 0000 06/25/24 0000  AST 24 35 28 17 23   ALT 29 33 31 24 19   ALKPHOS 55 51 60 81 70  BILITOT 0.5 0.5 0.6  --   --   PROT 6.6 6.6 6.7  --   --   ALBUMIN  3.2* 3.0* 3.5 3.4* 3.5   Recent Labs    01/01/24 0004 01/02/24 0557 01/05/24 1805 02/21/24 1435 04/02/24 0000  WBC 6.1 4.2 4.1 7.7 6.8  NEUTROABS 5.0  --  2.0 4.4  --   HGB 11.0* 10.7* 11.7* 12.0* 11.5*  HCT 32.8* 32.2*  34.6* 36.3* 34*  MCV 91.9 92.8 90.8 93.6  --   PLT 227 196 229 283 298   Lab Results  Component Value Date   TSH 1.27 04/02/2024   Lab Results  Component Value Date   HGBA1C 6.4 08/30/2017   Lab Results  Component Value Date   CHOL 82 05/29/2024   HDL 41 05/29/2024   LDLCALC 14 05/29/2024   LDLDIRECT 168.9 05/24/2013   TRIG 130 05/29/2024   CHOLHDL 4.2 08/04/2020    Significant Diagnostic Results in last 30 days:  No results found.  Assessment/Plan 1. Primary hypertension (Primary) - controlled with furosemide  and metoprolol   2. Mixed hyperlipidemia - off Repatha  - cont Zetia  and rosuvastatin   3. Coronary artery disease involving native coronary artery of native heart without angina pectoris - cont Eliquis  and rosuvastatin   4. Paroxysmal atrial fibrillation (HCC) - HR< 100 with metoprolol  - cont Eliquis  for clot prevention   5. Severe Alzheimer's dementia of other onset with agitation (HCC) - followed by psych due to agitation - unsuccessful trial Namenda  and Depakote  - continue to have behaviors in late afternoon and evening - hoyer tranfer - weight stable - cont ativan , haldol , Risperdal  and hydroxyzine   6. Recurrent major depressive disorder, in partial remission - see above - cont Lexapro  and mirtazapine  7. Rheumatoid arthritis involving elbow, unspecified laterality, unspecified whether rheumatoid factor present (HCC) - cont tylenol      Family/ staff Communication: plan discussed with patient and nurse  Labs/tests ordered:  none

## 2024-09-30 ENCOUNTER — Other Ambulatory Visit: Payer: Self-pay | Admitting: Orthopedic Surgery

## 2024-09-30 ENCOUNTER — Telehealth: Payer: Self-pay | Admitting: Adult Health

## 2024-09-30 DIAGNOSIS — J189 Pneumonia, unspecified organism: Secondary | ICD-10-CM

## 2024-09-30 MED ORDER — CEFTRIAXONE SODIUM 1 G IJ SOLR: 1.0000 g | Freq: Once | INTRAMUSCULAR | Status: AC

## 2024-09-30 MED ORDER — DOXYCYCLINE HYCLATE 100 MG PO TABS: 100.0000 mg | ORAL_TABLET | Freq: Two times a day (BID) | ORAL | Status: AC

## 2024-09-30 NOTE — Progress Notes (Signed)
 11/15 CXR noted bilateral pneumonia. Recent temp 99 after tylenol  suppository, prior temp was 100.8. O2 sats 85 % on 3 liters oxygen. He is not always compliant with taking oral antibiotics at this time. Orders for Rocephin  IM once and doxycyline x 7 days given.

## 2024-09-30 NOTE — Telephone Encounter (Signed)
 Nurse called on 11/15 to report that Mr. Ghosh sat was 89% with cough and rhonchi in both lungs. CXR ordered. Oxygen to titrate for sat>90% and duonebs.

## 2024-10-01 ENCOUNTER — Non-Acute Institutional Stay (SKILLED_NURSING_FACILITY): Payer: Self-pay | Admitting: Adult Health

## 2024-10-01 ENCOUNTER — Encounter: Payer: Self-pay | Admitting: Adult Health

## 2024-10-01 DIAGNOSIS — R601 Generalized edema: Secondary | ICD-10-CM

## 2024-10-01 DIAGNOSIS — G308 Other Alzheimer's disease: Secondary | ICD-10-CM

## 2024-10-01 DIAGNOSIS — F02C3 Dementia in other diseases classified elsewhere, severe, with mood disturbance: Secondary | ICD-10-CM

## 2024-10-01 DIAGNOSIS — Z7189 Other specified counseling: Secondary | ICD-10-CM | POA: Diagnosis not present

## 2024-10-01 DIAGNOSIS — J189 Pneumonia, unspecified organism: Secondary | ICD-10-CM | POA: Diagnosis not present

## 2024-10-01 NOTE — Progress Notes (Addendum)
 Location:  [facility] Room Number: 124 P Place of Service:  SNF (31) Provider:  Tawni America, NP    Patient Care Team: Charlanne Fredia CROME, MD as PCP - General (Internal Medicine) Shlomo Wilbert SAUNDERS, MD as PCP - Cardiology (Cardiology) Kelsie Agent, MD (Inactive) as PCP - Electrophysiology (Cardiology) Abran Norleen SAILOR, MD (Gastroenterology) Kelsie Agent, MD (Inactive) (Cardiology) Mai Agent FALCON, MD as Consulting Physician (Rheumatology) Alvaro Ricardo KATHEE Raddle., MD as Consulting Physician (Urology) Charmayne Molly, MD as Consulting Physician (Ophthalmology) Carolee Clap (Dentistry) Cheryn Nickels, MD as Referring Physician (Allergy and Immunology) Georjean Darice HERO, MD as Consulting Physician (Neurology) Fate Morna SAILOR, [facility] (Inactive) as Pharmacist (Pharmacist) Georjean Darice HERO, MD as Consulting Physician (Neurology)  Extended Emergency Contact Information Primary Emergency Contact: Privett,[name] Address: [address]          [place], [place] Home Phone: [phone] Work Phone: [phone] Mobile Phone: [phone] Relation: Spouse Secondary Emergency Contact: [name],[name] Home Phone: [phone] Mobile Phone: [phone] Relation: Daughter  Code Status:  DNR Goals of care: Advanced Directive information    08/27/2024    2:39 PM  Advanced Directives  Does Patient Have a Medical Advance Directive? Yes  Type of Estate Agent of [place];Out of facility DNR (pink MOST or yellow form);Living will  Does patient want to make changes to medical advance directive? No - Patient declined  Copy of Healthcare Power of Attorney in Chart? Yes - validated most recent copy scanned in chart (See row information)     Chief Complaint  Patient presents with   Acute Visit    Follow up pneumonia     HPI:  Pt is a 89 y.o. male seen today for an acute visit for  follow up regarding  pneumonia  The nurse notified me on 11/14 that he had worsening cough, congestion, sat of 89% on RA, and rhonchi in both lungs.  He has had sick contacts with a resp viral illness at [facility].  Neg for flu and covid CXR showed possible mild bilateral bronchopneumonia and/or mild pulmonary congestion. Prescribed doxycycline . He did take one dose of rocephin  due to difficulty swallowing which improve.d   Currently taking lasix  3 times a week for edema EF 50-55% grade 1 DD 02/23/16 Hx of afib on eliquis .   Oxygen titrated on 3 liters sats 92%. He was lethargic over the weekend ativan  and vistaril  were held at times.  This am he is more alert, NO distress. Continues with a congested cough.  Thickened liquids were given over the weekend due to concern for aspiration as he was lethargic. Nurse will attempt thin liquid this am.  Due to his dementia he can not provide a hx His wife indicated that she would not want him to go to the hospital He has a DNR  Bladders can was performed due to decreased urine output but did not meet standards for I and O Cath Nurse to check BM status.    Also it is observed that he has rigidity and tremor. He is on risperdal  to help control significant verbal and physical aggression. This is an ongoing issue.   Past Medical History:  Diagnosis Date   CAD (coronary artery disease)    a. s/p inferior STEMI 01/08/2014; LHC 01/08/14: total RCA occlusion s/p 3.5x57mm Xience DES distal RCA and 3.5x28 mm DES mid RCA, 60-70% mid LAD stenosis, EF 55%.   Complication of anesthesia    'long to wake up after  back surgery  09/2003   Diverticulosis of colon    GERD (gastroesophageal reflux disease)    History of cellulitis    05-26-2015  LLE   History of colon polyps    1998- benign/  2008 adenomatous    History of kidney stones    2013   History of squamous cell carcinoma excision    2013;  2015;  06-12-2015 right leg/  02/ and 05/ 2017  left ear and left leg    Hydronephrosis, left    Hyperlipidemia    Hypertension    Migraine with aura    OSA on CPAP 06/19/2015   Moderate OSA with AHI 18/hr  per study 05-20-2015   Osteoarthritis    Paroxysmal atrial fibrillation (HCC) 4/16   chads2vasc score is at least 4   Premature atrial contractions    RA (rheumatoid arthritis) [facility])    rheumatologist-  dr lynwood ramsay   Sinusitis, chronic 10/17/2015   Wears glasses    Wears hearing aid    bilateral   Past Surgical History:  Procedure Laterality Date   BACK SURGERY     CARDIOVASCULAR STRESS TEST  11/21/2015   Low risk nuclear study w/ a small diaphragmatic attenuation artifact, no ischemia/  normal LV function and wall motion , ef 63%   CATARACT EXTRACTION W/ INTRAOCULAR LENS  IMPLANT, BILATERAL  2015   COLONOSCOPY  last one 06-09-2011   CORONARY ANGIOPLASTY     CYSTOSCOPY W/ RETROGRADES Left 07/12/2016   Procedure: CYSTOSCOPY WITH RETROGRADE PYELOGRAM LEFT URETERAL STENT;  Surgeon: Norleen Seltzer, MD;  Location: [facility];  Service: Urology;  Laterality: Left;   CYSTOSCOPY WITH RETROGRADE PYELOGRAM, URETEROSCOPY AND STENT PLACEMENT Left 08/11/2016   Procedure: CYSTOSCOPY WITH RETROGRADE PYELOGRAM,  DIAGNOSTIC URETEROSCOPY , STENT EXCHANGE;  Surgeon: Ricardo Likens, MD;  Location: [facility];  Service: Urology;  Laterality: Left;   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   severe fibromatosis palm and fingers   LEFT HEART CATHETERIZATION WITH CORONARY ANGIOGRAM N/A 01/08/2014   Procedure: LEFT HEART CATHETERIZATION WITH CORONARY ANGIOGRAM;  Surgeon: Debby DELENA Sor, MD;  Location: [facility] CATH LAB;  Service: Cardiovascular;  Laterality:N/A;  total/ subtotal RCA/  mLAD 60-70% w/ mid systolic bridging/  preserved global LVF, ef 55%   MOHS SURGERY  x2  feb and may 2017   left ear /  left leg  ([facility])   ORIF ACETABULAR FRACTURE Right 03/29/2023   Procedure: OPEN REDUCTION INTERNAL FIXATION (ORIF) ACETABULAR FRACTURE;  Surgeon: Celena Sharper, MD;   Location: [facility] OR;  Service: Orthopedics;  Laterality: Right;   PERCUTANEOUS CORONARY STENT INTERVENTION (PCI-S)  01/08/2014   Procedure: PERCUTANEOUS CORONARY STENT INTERVENTION (PCI-S);  Surgeon: Debby DELENA Sor, MD;  [facility] CATH LAB;  Service: Cardiovascular;;  DES to mid and distal RCA   POSTERIOR LUMBAR FUSION  10/01/2003   and Laminectomy/ diskectomy  L4 -- S1   ROBOT ASSISTED PYELOPLASTY Left 09/15/2016   Procedure: XI ROBOTIC ASSISTED PYELOPLASTY;  Surgeon: Ricardo Likens, MD;  Location: [facility];  Service: Urology;  Laterality: Left;   TONSILLECTOMY     TRANSTHORACIC ECHOCARDIOGRAM  02/23/2016   mild LVH,  ef 50-55%,  grade 1 diastolic dysfunction/  mild to moderate AV calcification w/ no stenosis or regurg./  trivial MR and TR/  mild PR    Allergies  Allergen Reactions   Methotrexate Other (See Comments)    REACTION: tachycardia   Aricept  [Donepezil  Hcl] Other (See Comments)    Nightmares. Pt takes this  medication per [facility]    Crestor  [Rosuvastatin  Calcium ] Other (See Comments)    Myalgias with 20mg  dose   Lisinopril  Other (See Comments)    cough   Namenda  [Memantine ] Diarrhea and Nausea Only   Atorvastatin  Other (See Comments)    Muscle pain, leg cramps   Depakote  [Divalproex  Sodium] Rash    Allergy not listed on MAR    Pravastatin Sodium Other (See Comments)    REACTION: cramps, fatigue    Outpatient Encounter Medications as of 10/01/2024  Medication Sig   acetaminophen  (TYLENOL ) 325 MG tablet Take 2 tablets (650 mg total) by mouth in the morning and at bedtime.   apixaban  (ELIQUIS ) 5 MG TABS tablet Take 1 tablet (5 mg total) by mouth 2 (two) times daily.   camphor-menthol (SARNA) lotion Apply 1 Application topically 2 (two) times daily as needed for itching.   docusate sodium  (COLACE) 100 MG capsule Take 1 capsule (100 mg total) by mouth 2 (two) times daily.   doxycycline  (VIBRA -TABS) 100 MG tablet Take 1 tablet (100 mg total) by mouth 2 (two) times daily for 7 days.    Emollient (CETAPHIL) cream Apply 1 Application topically at bedtime.   escitalopram  (LEXAPRO ) 20 MG tablet Take 20 mg by mouth daily.   ezetimibe  (ZETIA ) 10 MG tablet Take 0.5 mg by mouth daily.   furosemide  (LASIX ) 20 MG tablet Take 20 mg by mouth 3 (three) times a week. In the morning on Monday, Wednesday, and Friday.   haloperidol  (HALDOL ) 1 MG tablet Take 2 mg by mouth 2 (two) times daily as needed for agitation.   hydrOXYzine  (VISTARIL ) 25 MG capsule Take 25 mg by mouth in the morning and at bedtime.   ipratropium-albuterol  (DUONEB) 0.5-2.5 (3) MG/3ML SOLN Take 3 mLs by nebulization every 8 (eight) hours as needed.   LORazepam  (ATIVAN ) 0.5 MG tablet Take 1 tablet (0.5 mg total) by mouth 2 (two) times daily. Give in the morning and mid day   LORazepam  (ATIVAN ) 1 MG tablet Take 1 tablet (1 mg total) by mouth every evening.   magnesium  hydroxide (MILK OF MAGNESIA) 400 MG/5ML suspension Take by mouth as directed.  Give 45 cc by mouth as needed for Constipation 45 cc po daily prn except with renal failure   melatonin 5 MG TABS Take 5 mg by mouth at bedtime.   methocarbamol  (ROBAXIN ) 500 MG tablet Take 1 tablet (500 mg total) by mouth every 6 (six) hours as needed for muscle spasms.   metoprolol  tartrate (LOPRESSOR ) 25 MG tablet Take 25 mg by mouth 2 (two) times daily. Hold for SBP <100   mirtazapine (REMERON SOL-TAB) 15 MG disintegrating tablet Take 15 mg by mouth at bedtime.   Multiple Vitamin (MULTIVITAMIN WITH MINERALS) TABS tablet Take 1 tablet by mouth daily.   nitroGLYCERIN  (NITROSTAT ) 0.4 MG SL tablet Place 0.4 mg under the tongue every 5 (five) minutes as needed for chest pain.   omeprazole  (PRILOSEC) 40 MG capsule Take 40 mg by mouth in the morning. Take on empty stomach 30 minutes before breakfast.   potassium chloride  (KLOR-CON ) 10 MEQ tablet Take 10 mEq by mouth 3 (three) times a week. Monday, Wednesday, and Friday.   risperiDONE  (RISPERDAL ) 1 MG tablet Take 2 mg by mouth every  evening.   risperiDONE  (RISPERDAL ) 1 MG tablet Take 1 tablet (1 mg total) by mouth every morning.   rosuvastatin  (CRESTOR ) 5 MG tablet Take 5 mg by mouth every morning.   triamcinolone  cream (KENALOG ) 0.1 % Apply 1 Application  topically daily as needed (rash / itching).   haloperidol  lactate (HALDOL ) 5 MG/ML injection Inject 2.5 mg into the muscle daily as needed. (Patient not taking: Reported on 10/01/2024)   No facility-administered encounter medications on file as of 10/01/2024.    Review of Systems  Unable to perform ROS: Dementia    Immunization History  Administered Date(s) Administered   Fluad Quad(high Dose 65+) 07/09/2019, 08/15/2020, 09/16/2021, 07/29/2022, 09/06/2023   INFLUENZA, HIGH DOSE SEASONAL PF 09/16/2015, 08/18/2016, 08/31/2017, 08/14/2018   Influenza Split 08/10/2011, 08/15/2012   Influenza Whole 08/21/2009, 09/22/2010   Influenza,inj,Quad PF,6+ Mos 08/06/2013   Influenza-Unspecified 08/31/2017, 08/14/2024   Moderna Covid-19 Vaccine  Bivalent Booster 32yrs & up 09/06/2023   PFIZER(Purple Top)SARS-COV-2 Vaccination 12/11/2019, 01/01/2020, 08/12/2020   Pfizer Covid-19 Vaccine Bivalent Booster 23yrs & up 03/11/2023   Pfizer(Comirnaty)Fall Seasonal Vaccine 12 years and older 08/14/2024   Pneumococcal Conjugate-13 10/02/2013   Pneumococcal Polysaccharide-23 06/04/2010   Tdap 10/22/2011, 09/22/2022   Zoster Recombinant(Shingrix) 03/09/2017, 05/10/2017   Pertinent  Health Maintenance Due  Topic Date Due   Influenza Vaccine  Completed      08/18/2023    4:26 PM 11/01/2023   12:07 PM 04/24/2024   10:15 AM 07/19/2024    4:39 PM 09/18/2024    3:34 PM  Fall Risk  Falls in the past year? 1 0 1 0 0  Was there an injury with Fall? 1 0 0 0 0  Fall Risk Category Calculator 3 0 1 0 0  Patient at Risk for Falls Due to History of fall(s)  History of fall(s);Impaired balance/gait;Impaired mobility History of fall(s);Impaired balance/gait;Impaired mobility History of  fall(s);Impaired balance/gait;Impaired mobility  Fall risk Follow up Falls evaluation completed Falls evaluation completed Falls evaluation completed;Education provided Falls evaluation completed;Education provided Falls evaluation completed   Functional Status Survey:    Vitals:   10/01/24 0957  BP: 132/84  Pulse: 68  Resp: 16  Temp: 97.8 F (36.6 C)  SpO2: 93%  Weight: 200 lb (90.7 kg)  Height: 6' (1.829 m)   Body mass index is 27.12 kg/m. Physical Exam Vitals and nursing note reviewed.  Constitutional:      Appearance: Normal appearance.  HENT:     Head: Normocephalic and atraumatic.     Right Ear: Tympanic membrane normal.     Left Ear: Tympanic membrane normal.     Nose: Nose normal.     Mouth/Throat:     Mouth: Mucous membranes are moist.     Pharynx: Oropharyngeal exudate present.  Eyes:     Conjunctiva/sclera: Conjunctivae normal.     Pupils: Pupils are equal, round, and reactive to light.  Cardiovascular:     Rate and Rhythm: Normal rate and regular rhythm.     Heart sounds: No murmur heard. Pulmonary:     Effort: Pulmonary effort is normal. No respiratory distress.     Breath sounds: Wheezing and rhonchi present.  Abdominal:     General: Bowel sounds are normal. There is distension (mild).     Palpations: Abdomen is soft.     Tenderness: There is no abdominal tenderness.  Musculoskeletal:     Cervical back: Normal range of motion. No rigidity.     Right lower leg: Edema present.     Left lower leg: Edema present.     Comments: Generalized edema  Lymphadenopathy:     Cervical: No cervical adenopathy.  Skin:    General: Skin is warm and dry.  Neurological:     General: No focal deficit present.  Mental Status: He is alert. Mental status is at baseline.  Psychiatric:        Mood and Affect: Mood normal.     Labs reviewed: Recent Labs    01/02/24 0557 01/05/24 1805 01/06/24 0000 02/21/24 1452 04/02/24 0000 06/25/24 0000 08/13/24 0000  NA  135 139   < > 138 139 142 139  K 3.4* 3.6   < > 3.6 4.1 3.8 3.9  CL 105 107   < > 106 106 107 105  CO2 20* 24   < > 24 21 24* 23*  GLUCOSE 112* 96  --  81  --   --   --   BUN 21 15   < > 16 19 14 14   CREATININE 0.76 0.88   < > 0.89 0.8 0.8 0.8  CALCIUM  8.4* 8.5*   < > 8.7* 8.5* 8.8 8.9  MG 1.9  --   --   --   --   --   --    < > = values in this interval not displayed.   Recent Labs    01/01/24 0004 01/02/24 0557 02/21/24 1452 04/02/24 0000 06/25/24 0000  AST 24 35 28 17 23   ALT 29 33 31 24 19   ALKPHOS 55 51 60 81 70  BILITOT 0.5 0.5 0.6  --   --   PROT 6.6 6.6 6.7  --   --   ALBUMIN  3.2* 3.0* 3.5 3.4* 3.5   Recent Labs    01/01/24 0004 01/02/24 0557 01/05/24 1805 02/21/24 1435 04/02/24 0000  WBC 6.1 4.2 4.1 7.7 6.8  NEUTROABS 5.0  --  2.0 4.4  --   HGB 11.0* 10.7* 11.7* 12.0* 11.5*  HCT 32.8* 32.2* 34.6* 36.3* 34*  MCV 91.9 92.8 90.8 93.6  --   PLT 227 196 229 283 298   Lab Results  Component Value Date   TSH 1.27 04/02/2024   Lab Results  Component Value Date   HGBA1C 6.4 08/30/2017   Lab Results  Component Value Date   CHOL 82 05/29/2024   HDL 41 05/29/2024   LDLCALC 14 05/29/2024   LDLDIRECT 168.9 05/24/2013   TRIG 130 05/29/2024   CHOLHDL 4.2 08/04/2020    Significant Diagnostic Results in last 30 days:  No results found.  Assessment/Plan  1. Healthcare-associated pneumonia (Primary) Continue doxycycline  to complete 7 day course Continue oxygen titrate for sat > or equal to 90 Duonebs prn  2. Generalized edema Increase lasix  to 40 mg every day x 2 d and kdur 20 meq every day x 2 Then return to previous dosing on Friday 11/21  3.Severe dementia with behavioral disturbance Followed by psychiatry Behaviors are improved over time but still continue and he benefits from the medication that he is currently on  Risperdal  and risperdal   4. Advanced care planning/counseling discussion I called and discussed his care with his daughter Missy. Mr  Duford has progressive dementia requiring antipsychotics to control aggression. We are seeing some s/e with this to include tremor and rigidity. We both agreed the benefit that the medication provides outweighs the risk. His goals of care center around comfort and quality of life. For this reason I discussed a most form. Missy and family agree that they would like to avoid hospitalizations. He has a DNR . Staff will provide a most form to missy when she comes.    Family/ staff Communication: discussed care with his daughter missy   Labs/tests ordered:  CMP  Total time :  time greater than 50% of total time spent doing pt counseling and coordination of care

## 2024-10-02 ENCOUNTER — Non-Acute Institutional Stay (SKILLED_NURSING_FACILITY): Payer: Self-pay | Admitting: Orthopedic Surgery

## 2024-10-02 ENCOUNTER — Encounter: Payer: Self-pay | Admitting: Orthopedic Surgery

## 2024-10-02 DIAGNOSIS — J189 Pneumonia, unspecified organism: Secondary | ICD-10-CM | POA: Diagnosis not present

## 2024-10-02 MED ORDER — CEFTRIAXONE SODIUM 1 G IJ SOLR: 1.0000 g | Freq: Once | INTRAMUSCULAR | Status: AC

## 2024-10-02 NOTE — Progress Notes (Signed)
 Location:  [facility] Room Number: 124/P Place of Service:  SNF [id number]) Provider:  Greig FORBES Cluster, NP   Charlanne Fredia CROME, MD  Patient Care Team: Charlanne Fredia CROME, MD as PCP - General (Internal Medicine) Shlomo Wilbert SAUNDERS, MD as PCP - Cardiology (Cardiology) Kelsie Agent, MD (Inactive) as PCP - Electrophysiology (Cardiology) Abran Norleen SAILOR, MD (Gastroenterology) Kelsie Agent, MD (Inactive) (Cardiology) Mai Agent FALCON, MD as Consulting Physician (Rheumatology) Alvaro Ricardo KATHEE Raddle., MD as Consulting Physician (Urology) Charmayne Molly, MD as Consulting Physician (Ophthalmology) Carolee Clap (Dentistry) Cheryn Nickels, MD as Referring Physician (Allergy and Immunology) Georjean Darice HERO, MD as Consulting Physician (Neurology) Fate Morna SAILOR, [facility] (Inactive) as Pharmacist (Pharmacist) Georjean Darice HERO, MD as Consulting Physician (Neurology)  Extended Emergency Contact Information Primary Emergency Contact: Cimo,[name] Address: [address]          [place], [place] Home Phone: [phone] Work Phone: [phone] Mobile Phone: [phone] Relation: Spouse Secondary Emergency Contact: [name],[name] Home Phone: [phone] Mobile Phone: [phone] Relation: Daughter  Code Status:  DNR Goals of care: Advanced Directive information    08/27/2024    2:39 PM  Advanced Directives  Does Patient Have a Medical Advance Directive? Yes  Type of Estate Agent of [place];Out of facility DNR (pink MOST or yellow form);Living will  Does patient want to make changes to medical advance directive? No - Patient declined  Copy of Healthcare Power of Attorney in Chart? Yes - validated most recent copy scanned in chart (See row information)     Chief Complaint  Patient presents with   Medical Management of Chronic Issues   Acute Visit    cough    HPI:  Pt is a 89 y.o. male seen today for acute visit  due to recent pneumonia and cough.   He currently resides on the skilled nursing unit due to Alzheimer's dementia. PMH: CAD s/p STEMI 2015, HTN, HLD, PAF, GERD, frequent falls s/p subdural hematoma 09/2022, left hydronephrosis, RA, migraine, OSA, OA, depression and insomnia.   Poor historian due to dementia. Recently diagnosed with mild bilateral bronchopneumonia. 11/15 he was given one dose of rocephin  and started on doxycycline . He remains on 3 liters oxygen. Sats > 90% on 3 liters. He continues to have periods of being lethargic. He has missed a few doses of antibiotic due to lethargy. Advanced directives discussed 11/17, wife does not want hospitalizations. He was awake enough to follow some commands today. He is coughing when drinking thin fluids. He denies pain. Afebrile.     Past Medical History:  Diagnosis Date   CAD (coronary artery disease)    a. s/p inferior STEMI 01/08/2014; LHC 01/08/14: total RCA occlusion s/p 3.5x49mm Xience DES distal RCA and 3.5x28 mm DES mid RCA, 60-70% mid LAD stenosis, EF 55%.   Complication of anesthesia    'long to wake up after back surgery  09/2003   Diverticulosis of colon    GERD (gastroesophageal reflux disease)    History of cellulitis    05-26-2015  LLE   History of colon polyps    1998- benign/  2008 adenomatous    History of kidney stones    2013   History of squamous cell carcinoma excision    2013;  2015;  06-12-2015 right leg/  02/ and 05/ 2017  left ear and left leg   Hydronephrosis, left    Hyperlipidemia    Hypertension    Migraine with aura  OSA on CPAP 06/19/2015   Moderate OSA with AHI 18/hr  per study 05-20-2015   Osteoarthritis    Paroxysmal atrial fibrillation (HCC) 4/16   chads2vasc score is at least 4   Premature atrial contractions    RA (rheumatoid arthritis) [facility])    rheumatologist-  dr lynwood ramsay   Sinusitis, chronic 10/17/2015   Wears glasses    Wears hearing aid    bilateral   Past Surgical History:   Procedure Laterality Date   BACK SURGERY     CARDIOVASCULAR STRESS TEST  11/21/2015   Low risk nuclear study w/ a small diaphragmatic attenuation artifact, no ischemia/  normal LV function and wall motion , ef 63%   CATARACT EXTRACTION W/ INTRAOCULAR LENS  IMPLANT, BILATERAL  2015   COLONOSCOPY  last one 06-09-2011   CORONARY ANGIOPLASTY     CYSTOSCOPY W/ RETROGRADES Left 07/12/2016   Procedure: CYSTOSCOPY WITH RETROGRADE PYELOGRAM LEFT URETERAL STENT;  Surgeon: Norleen Seltzer, MD;  Location: [facility];  Service: Urology;  Laterality: Left;   CYSTOSCOPY WITH RETROGRADE PYELOGRAM, URETEROSCOPY AND STENT PLACEMENT Left 08/11/2016   Procedure: CYSTOSCOPY WITH RETROGRADE PYELOGRAM,  DIAGNOSTIC URETEROSCOPY , STENT EXCHANGE;  Surgeon: Ricardo Likens, MD;  [facility];  Service: Urology;  Laterality: Left;   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   severe fibromatosis palm and fingers   LEFT HEART CATHETERIZATION WITH CORONARY ANGIOGRAM N/A 01/08/2014   Procedure: LEFT HEART CATHETERIZATION WITH CORONARY ANGIOGRAM;  Surgeon: Debby DELENA Sor, MD;  Location[facility] C[facility] LAB;  Service: Cardiovascular;  Laterality:N/A;  total/ subtotal RCA/  mLAD 60-70% w/ mid systolic bridging/  preserved global LVF, ef 55%   MOHS SURGERY  x2  feb and may 2017   left ear /  left leg  ([facility])   ORIF ACETABULAR FRACTURE Right 03/29/2023   Procedure: OPEN REDUCTION INTERNAL FIXATION (ORIF) ACETABULAR FRACTURE;  Surgeon: Celena Sharper, MD;  Location: [facility] OR;  Service: Orthopedics;  Laterality: Right;   PERCUTANEOUS CORONARY STENT INTERVENTION (PCI-S)  01/08/2014   Procedure: PERCUTANEOUS CORONARY STENT INTERVENTION (PCI-S);  Surgeon: Debby DELENA Sor, MD;  Location: [facility] CATH LAB;  Service: Cardiovascular;;  DES to mid and distal RCA   POSTERIOR LUMBAR FUSION  10/01/2003   and Laminectomy/ diskectomy  L4 -- S1   ROBOT ASSISTED PYELOPLASTY Left 09/15/2016   Procedure: XI ROBOTIC ASSISTED PYELOPLASTY;  Surgeon:  Ricardo Likens, MD;  Location: [facility];  Service: Urology;  Laterality: Left;   TONSILLECTOMY     TRANSTHORACIC ECHOCARDIOGRAM  02/23/2016   mild LVH,  ef 50-55%,  grade 1 diastolic dysfunction/  mild to moderate AV calcification w/ no stenosis or regurg./  trivial MR and TR/  mild PR    Allergies  Allergen Reactions   Methotrexate Other (See Comments)    REACTION: tachycardia   Aricept  [Donepezil  Hcl] Other (See Comments)    Nightmares. Pt takes this medication per [facility]    Crestor  [Rosuvastatin  Calcium ] Other (See Comments)    Myalgias with 20mg  dose   Lisinopril  Other (See Comments)    cough   Namenda  [Memantine ] Diarrhea and Nausea Only   Atorvastatin  Other (See Comments)    Muscle pain, leg cramps   Depakote  [Divalproex  Sodium] Rash    Allergy not listed on MAR    Pravastatin Sodium Other (See Comments)    REACTION: cramps, fatigue    Outpatient Encounter Medications as of 10/02/2024  Medication Sig   acetaminophen  (TYLENOL ) 325 MG tablet Take 2 tablets (650 mg total)  by mouth in the morning and at bedtime.   apixaban  (ELIQUIS ) 5 MG TABS tablet Take 1 tablet (5 mg total) by mouth 2 (two) times daily.   camphor-menthol (SARNA) lotion Apply 1 Application topically 2 (two) times daily as needed for itching.   docusate sodium  (COLACE) 100 MG capsule Take 1 capsule (100 mg total) by mouth 2 (two) times daily.   doxycycline  (VIBRA -TABS) 100 MG tablet Take 1 tablet (100 mg total) by mouth 2 (two) times daily for 7 days.   Emollient (CETAPHIL) cream Apply 1 Application topically at bedtime.   escitalopram  (LEXAPRO ) 20 MG tablet Take 20 mg by mouth daily.   ezetimibe  (ZETIA ) 10 MG tablet Take 0.5 mg by mouth daily.   furosemide  (LASIX ) 20 MG tablet Take 20 mg by mouth 3 (three) times a week. In the morning on Monday, Wednesday, and Friday.   haloperidol  (HALDOL ) 1 MG tablet Take 2 mg by mouth 2 (two) times daily as needed for agitation.   hydrOXYzine  (VISTARIL ) 25 MG capsule Take 25 mg  by mouth in the morning and at bedtime.   ipratropium-albuterol  (DUONEB) 0.5-2.5 (3) MG/3ML SOLN Take 3 mLs by nebulization every 8 (eight) hours as needed.   LORazepam  (ATIVAN ) 0.5 MG tablet Take 1 tablet (0.5 mg total) by mouth 2 (two) times daily. Give in the morning and mid day   LORazepam  (ATIVAN ) 1 MG tablet Take 1 tablet (1 mg total) by mouth every evening.   magnesium  hydroxide (MILK OF MAGNESIA) 400 MG/5ML suspension Take by mouth as directed.  Give 45 cc by mouth as needed for Constipation 45 cc po daily prn except with renal failure   melatonin 5 MG TABS Take 5 mg by mouth at bedtime.   methocarbamol  (ROBAXIN ) 500 MG tablet Take 1 tablet (500 mg total) by mouth every 6 (six) hours as needed for muscle spasms.   metoprolol  tartrate (LOPRESSOR ) 25 MG tablet Take 25 mg by mouth 2 (two) times daily. Hold for SBP <100   mirtazapine (REMERON SOL-TAB) 15 MG disintegrating tablet Take 15 mg by mouth at bedtime.   Multiple Vitamin (MULTIVITAMIN WITH MINERALS) TABS tablet Take 1 tablet by mouth daily.   nitroGLYCERIN  (NITROSTAT ) 0.4 MG SL tablet Place 0.4 mg under the tongue every 5 (five) minutes as needed for chest pain.   omeprazole  (PRILOSEC) 40 MG capsule Take 40 mg by mouth in the morning. Take on empty stomach 30 minutes before breakfast.   potassium chloride  (KLOR-CON ) 10 MEQ tablet Take 10 mEq by mouth 3 (three) times a week. Monday, Wednesday, and Friday.   risperiDONE  (RISPERDAL ) 1 MG tablet Take 2 mg by mouth every evening.   risperiDONE  (RISPERDAL ) 1 MG tablet Take 1 tablet (1 mg total) by mouth every morning.   rosuvastatin  (CRESTOR ) 5 MG tablet Take 5 mg by mouth every morning.   triamcinolone  cream (KENALOG ) 0.1 % Apply 1 Application topically daily as needed (rash / itching).   No facility-administered encounter medications on file as of 10/02/2024.    Review of Systems  Unable to perform ROS: Dementia    Immunization History  Administered Date(s) Administered   Fluad  Quad(high Dose 65+) 07/09/2019, 08/15/2020, 09/16/2021, 07/29/2022, 09/06/2023   INFLUENZA, HIGH DOSE SEASONAL PF 09/16/2015, 08/18/2016, 08/31/2017, 08/14/2018   Influenza Split 08/10/2011, 08/15/2012   Influenza Whole 08/21/2009, 09/22/2010   Influenza,inj,Quad PF,6+ Mos 08/06/2013   Influenza-Unspecified 08/31/2017, 08/14/2024   Moderna Covid-19 Vaccine  Bivalent Booster 88yrs & up 09/06/2023   PFIZER(Purple Top)SARS-COV-2 Vaccination 12/11/2019, 01/01/2020, 08/12/2020  [employer], [employer] 57yrs & up 03/11/2023   Pfizer(Comirnaty)Fall Seasonal Vaccine 12 years and older 08/14/2024   Pneumococcal Conjugate-13 10/02/2013   Pneumococcal Polysaccharide-23 06/04/2010   Tdap 10/22/2011, 09/22/2022   Zoster Recombinant(Shingrix) 03/09/2017, 05/10/2017   Pertinent  Health Maintenance Due  Topic Date Due   Influenza Vaccine  Completed      08/18/2023    4:26 PM 11/01/2023   12:07 PM 04/24/2024   10:15 AM 07/19/2024    4:39 PM 09/18/2024    3:34 PM  Fall Risk  Falls in the past year? 1 0 1 0 0  Was there an injury with Fall? 1 0 0 0 0  Fall Risk Category Calculator 3 0 1 0 0  Patient at Risk for Falls Due to History of fall(s)  History of fall(s);Impaired balance/gait;Impaired mobility History of fall(s);Impaired balance/gait;Impaired mobility History of fall(s);Impaired balance/gait;Impaired mobility  Fall risk Follow up Falls evaluation completed Falls evaluation completed Falls evaluation completed;Education provided Falls evaluation completed;Education provided Falls evaluation completed   Functional Status Survey:    Vitals:   10/02/24 1355  BP: 118/74  Pulse: 75  Resp: 16  Temp: 97.7 F (36.5 C)  SpO2: 93%  Weight: 200 lb (90.7 kg)  Height: 6' (1.829 m)   Body mass index is 27.12 kg/m. Physical Exam Vitals reviewed.  Constitutional:      General: He is not in acute distress. HENT:     Head: Normocephalic.  Eyes:     General:        Right eye: No  discharge.        Left eye: No discharge.  Cardiovascular:     Rate and Rhythm: Normal rate and regular rhythm.     Pulses: Normal pulses.     Heart sounds: Normal heart sounds.  Pulmonary:     Effort: Pulmonary effort is normal. No respiratory distress.     Breath sounds: Examination of the right-upper field reveals rhonchi. Examination of the left-upper field reveals rhonchi. Examination of the right-middle field reveals decreased breath sounds. Examination of the left-middle field reveals decreased breath sounds. Examination of the right-lower field reveals decreased breath sounds. Examination of the left-lower field reveals decreased breath sounds. Decreased breath sounds and rhonchi present.  Abdominal:     General: Bowel sounds are normal.     Palpations: Abdomen is soft.  Musculoskeletal:     Cervical back: Neck supple.     Right lower leg: No edema.     Left lower leg: No edema.  Skin:    General: Skin is warm.     Capillary Refill: Capillary refill takes less than 2 seconds.  Neurological:     General: No focal deficit present.     Mental Status: He is easily aroused. Mental status is at baseline.     Gait: Gait abnormal.  Psychiatric:        Mood and Affect: Mood normal.     Labs reviewed: Recent Labs    01/02/24 0557 01/05/24 1805 01/06/24 0000 02/21/24 1452 04/02/24 0000 06/25/24 0000 08/13/24 0000  NA 135 139   < > 138 139 142 139  K 3.4* 3.6   < > 3.6 4.1 3.8 3.9  CL 105 107   < > 106 106 107 105  CO2 20* 24   < > 24 21 24* 23*  GLUCOSE 112* 96  --  81  --   --   --   BUN 21 15   < >  16 19 14 14   CREATININE 0.76 0.88   < > 0.89 0.8 0.8 0.8  CALCIUM  8.4* 8.5*   < > 8.7* 8.5* 8.8 8.9  MG 1.9  --   --   --   --   --   --    < > = values in this interval not displayed.   Recent Labs    01/01/24 0004 01/02/24 0557 02/21/24 1452 04/02/24 0000 06/25/24 0000  AST 24 35 28 17 23   ALT 29 33 31 24 19   ALKPHOS 55 51 60 81 70  BILITOT 0.5 0.5 0.6  --   --    PROT 6.6 6.6 6.7  --   --   ALBUMIN  3.2* 3.0* 3.5 3.4* 3.5   Recent Labs    01/01/24 0004 01/02/24 0557 01/05/24 1805 02/21/24 1435 04/02/24 0000  WBC 6.1 4.2 4.1 7.7 6.8  NEUTROABS 5.0  --  2.0 4.4  --   HGB 11.0* 10.7* 11.7* 12.0* 11.5*  HCT 32.8* 32.2* 34.6* 36.3* 34*  MCV 91.9 92.8 90.8 93.6  --   PLT 227 196 229 283 298   Lab Results  Component Value Date   TSH 1.27 04/02/2024   Lab Results  Component Value Date   HGBA1C 6.4 08/30/2017   Lab Results  Component Value Date   CHOL 82 05/29/2024   HDL 41 05/29/2024   LDLCALC 14 05/29/2024   LDLDIRECT 168.9 05/24/2013   TRIG 130 05/29/2024   CHOLHDL 4.2 08/04/2020    Significant Diagnostic Results in last 30 days:  No results found.  Assessment/Plan 1. Healthcare-associated pneumonia (Primary) - 11/15 CXR confirmed bilateral bilateral bronchopneumonia - diminished bases and rhonchi to upper lobes, coughing when drinking  - received 1 dose rocephin  and started doxy x 7 days - 11/17 advanced directives dicussed> no hospitalizations per wife - continues to have periods of lethargy - will repeat rocephin  1 g IM once - cont doxycycline  - make duonebs BID x 5 days - ST evaluation> downgrade fluids to nectar thick     Family/ staff Communication: plan discussed with patient and nurse  Labs/tests ordered: ST evaluation

## 2024-10-04 DIAGNOSIS — I10 Essential (primary) hypertension: Secondary | ICD-10-CM | POA: Diagnosis not present

## 2024-10-04 DIAGNOSIS — I480 Paroxysmal atrial fibrillation: Secondary | ICD-10-CM | POA: Diagnosis not present

## 2024-10-04 LAB — BASIC METABOLIC PANEL WITH GFR
BUN: 25 — AB (ref 4–21)
CO2: 25 — AB (ref 13–22)
Chloride: 106 (ref 99–108)
Creatinine: 0.7 (ref 0.6–1.3)
Glucose: 104
Potassium: 3.8 meq/L (ref 3.5–5.1)
Sodium: 140 (ref 137–147)

## 2024-10-04 LAB — COMPREHENSIVE METABOLIC PANEL WITH GFR
Calcium: 8.9 (ref 8.7–10.7)
eGFR: 88

## 2024-10-11 ENCOUNTER — Telehealth: Payer: Self-pay | Admitting: Adult Health

## 2024-10-11 MED ORDER — MORPHINE SULFATE (CONCENTRATE) 20 MG/ML PO SOLN: 5.0000 mg | ORAL | 0 refills | Status: AC | PRN

## 2024-10-11 NOTE — Telephone Encounter (Signed)
 The nurse called to report that [name] has sats in the 80's with mottling and lethargy. He has a DNR and a most form indicating no hospitalizations. He has been treated for pneumonia and CHF but is not responding. His family has requested comfort care. Discontinue all meds except ativan . Start morphine  prn. Hospice consult.

## 2024-10-15 ENCOUNTER — Encounter: Payer: Self-pay | Admitting: Adult Health

## 2024-10-15 ENCOUNTER — Non-Acute Institutional Stay (SKILLED_NURSING_FACILITY): Payer: Self-pay | Admitting: Adult Health

## 2024-10-15 DIAGNOSIS — G308 Other Alzheimer's disease: Secondary | ICD-10-CM

## 2024-10-15 DIAGNOSIS — F02C11 Dementia in other diseases classified elsewhere, severe, with agitation: Secondary | ICD-10-CM

## 2024-10-15 DIAGNOSIS — J9601 Acute respiratory failure with hypoxia: Secondary | ICD-10-CM

## 2024-10-15 DIAGNOSIS — J189 Pneumonia, unspecified organism: Secondary | ICD-10-CM | POA: Diagnosis not present

## 2024-10-15 NOTE — Progress Notes (Signed)
 Location:  [facility] Room Number: 124 P Place of Service:  SNF (31) Provider:  Tawni America, NP   Patient Care Team: Charlanne Fredia CROME, MD as PCP - General (Internal Medicine) Shlomo Wilbert SAUNDERS, MD as PCP - Cardiology (Cardiology) Kelsie Agent, MD (Inactive) as PCP - Electrophysiology (Cardiology) Abran Norleen SAILOR, MD (Gastroenterology) Kelsie Agent, MD (Inactive) (Cardiology) Mai Agent FALCON, MD as Consulting Physician (Rheumatology) Alvaro Ricardo KATHEE Raddle., MD as Consulting Physician (Urology) Charmayne Molly, MD as Consulting Physician (Ophthalmology) Carolee Clap (Dentistry) Cheryn Nickels, MD as Referring Physician (Allergy and Immunology) Georjean Darice HERO, MD as Consulting Physician (Neurology) Fate Morna SAILOR, [facility] (Inactive) as Pharmacist (Pharmacist) Georjean Darice HERO, MD as Consulting Physician (Neurology)  Extended Emergency Contact Information Primary Emergency Contact: Ritchey,[name] Address: [address]          [place], [place] Home Phone: [phone] Work Phone: [phone] Mobile Phone: [phone] Relation: Spouse Secondary Emergency Contact: [name],[name] Home Phone: [phone] Mobile Phone: [phone] Relation: Daughter  Code Status:  DNR Goals of care: Advanced Directive information    08/27/2024    2:39 PM  Advanced Directives  Does Patient Have a Medical Advance Directive? Yes  Type of Estate Agent of [place];Out of facility DNR (pink MOST or yellow form);Living will  Does patient want to make changes to medical advance directive? No - Patient declined  Copy of Healthcare Power of Attorney in Chart? Yes - validated most recent copy scanned in chart (See row information)     Chief Complaint  Patient presents with   Acute Visit    Follow up resp distress    HPI:  Pt is a 89 y.o. male seen today for an acute visit for follow up regarding respiratory  distress.  I was notified on 10/11/24 that Mr. Keilman had mottling, lethargy, and sats in the 80's. The nurse reported his family had been notified and favored comfort care. Meds were discontinued and morphine  ordered. Over the weekend he became more responsive and did eat. He has not had any further distress and good color. Sats 97% on 3 liters. Hospice consult pending. He has underlying dementia with behaviors and requires a skilled level of care. Has not needed morphine .   Background The nurse notified me on 11/14 that he had worsening cough, congestion, sat of 89% on RA, and rhonchi in both lungs.  He has had sick contacts with a resp viral illness at [facility].  Neg for flu and covid CXR showed possible mild bilateral bronchopneumonia and/or mild pulmonary congestion. Prescribed doxycycline . He did take one two doses  of rocephin  due to difficulty swallowing. Hx of CHF EF 50-55% grade 1 DD 02/23/16 Hx of afib on eliquis .    Past Medical History:  Diagnosis Date   CAD (coronary artery disease)    a. s/p inferior STEMI 01/08/2014; LHC 01/08/14: total RCA occlusion s/p 3.5x73mm Xience DES distal RCA and 3.5x28 mm DES mid RCA, 60-70% mid LAD stenosis, EF 55%.   Complication of anesthesia    'long to wake up after back surgery  09/2003   Diverticulosis of colon    GERD (gastroesophageal reflux disease)    History of cellulitis    05-26-2015  LLE   History of colon polyps    1998- benign/  2008 adenomatous    History of kidney stones    2013   History of squamous cell carcinoma excision    2013;  2015;  06-12-2015 right  leg/  02/ and 05/ 2017  left ear and left leg   Hydronephrosis, left    Hyperlipidemia    Hypertension    Migraine with aura    OSA on CPAP 06/19/2015   Moderate OSA with AHI 18/hr  per study 05-20-2015   Osteoarthritis    Paroxysmal atrial fibrillation (HCC) 4/16   chads2vasc score is at least 4   Premature atrial contractions    RA (rheumatoid arthritis) [facility])     rheumatologist-  dr lynwood ramsay   Sinusitis, chronic 10/17/2015   Wears glasses    Wears hearing aid    bilateral   Past Surgical History:  Procedure Laterality Date   BACK SURGERY     CARDIOVASCULAR STRESS TEST  11/21/2015   Low risk nuclear study w/ a small diaphragmatic attenuation artifact, no ischemia/  normal LV function and wall motion , ef 63%   CATARACT EXTRACTION W/ INTRAOCULAR LENS  IMPLANT, BILATERAL  2015   COLONOSCOPY  last one 06-09-2011   CORONARY ANGIOPLASTY     CYSTOSCOPY W/ RETROGRADES Left 07/12/2016   Procedure: CYSTOSCOPY WITH RETROGRADE PYELOGRAM LEFT URETERAL STENT;  Surgeon: Norleen Seltzer, MD;  Location: [facility];  Service: Urology;  Laterality: Left;   CYSTOSCOPY WITH RETROGRADE PYELOGRAM, URETEROSCOPY AND STENT PLACEMENT Left 08/11/2016   Procedure: CYSTOSCOPY WITH RETROGRADE PYELOGRAM,  DIAGNOSTIC URETEROSCOPY , STENT EXCHANGE;  Surgeon: Ricardo Likens, MD;  Location[facility];  Service: Urology;  Laterality: Left;   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   severe fibromatosis palm and fingers   LEFT HEART CATHETERIZATION WITH CORONARY ANGIOGRAM N/A 01/08/2014   Procedure: LEFT HEART CATHETERIZATION WITH CORONARY ANGIOGRAM;  Surgeon: Debby DELENA Sor, MD;  Location: [facility] CATH LAB;  Service: Cardiovascular;  Laterality:N/A;  total/ subtotal RCA/  mLAD 60-70% w/ mid systolic bridging/  preserved global LVF, ef 55%   MOHS SURGERY  x2  feb and may 2017   left ear /  left leg  ([facility])   ORIF ACETABULAR FRACTURE Right 03/29/2023   Procedure: OPEN REDUCTION INTERNAL FIXATION (ORIF) ACETABULAR FRACTURE;  Surgeon: Celena Sharper, MD;  Location: [facility] OR;  Service: Orthopedics;  Laterality: Right;   PERCUTANEOUS CORONARY STENT INTERVENTION (PCI-S)  01/08/2014   Procedure: PERCUTANEOUS CORONARY STENT INTERVENTION (PCI-S);  Surgeon: Debby DELENA Sor, MD;  Location: [facility] CATH LAB;  Service: Cardiovascular;;  DES to mid and distal RCA   POSTERIOR LUMBAR FUSION   10/01/2003   and Laminectomy/ diskectomy  L4 -- S1   ROBOT ASSISTED PYELOPLASTY Left 09/15/2016   Procedure: XI ROBOTIC ASSISTED PYELOPLASTY;  Surgeon: Ricardo Likens, MD;  Location: [facility];  Service: Urology;  Laterality: Left;   TONSILLECTOMY     TRANSTHORACIC ECHOCARDIOGRAM  02/23/2016   mild LVH,  ef 50-55%,  grade 1 diastolic dysfunction/  mild to moderate AV calcification w/ no stenosis or regurg./  trivial MR and TR/  mild PR    Allergies  Allergen Reactions   Methotrexate Other (See Comments)    REACTION: tachycardia   Aricept  [Donepezil  Hcl] Other (See Comments)    Nightmares. Pt takes this medication per [facility]    Crestor  [Rosuvastatin  Calcium ] Other (See Comments)    Myalgias with 20mg  dose   Lisinopril  Other (See Comments)    cough   Namenda  [Memantine ] Diarrhea and Nausea Only   Atorvastatin  Other (See Comments)    Muscle pain, leg cramps   Depakote  [Divalproex  Sodium] Rash    Allergy not listed on MAR    Pravastatin Sodium Other (  See Comments)    REACTION: cramps, fatigue    Outpatient Encounter Medications as of 10/15/2024  Medication Sig   camphor-menthol (SARNA) lotion Apply 1 Application topically 2 (two) times daily as needed for itching.   Emollient (CETAPHIL) cream Apply 1 Application topically at bedtime.   ipratropium-albuterol  (DUONEB) 0.5-2.5 (3) MG/3ML SOLN Take 3 mLs by nebulization every 8 (eight) hours as needed.   LORazepam  (ATIVAN ) 0.5 MG tablet Take 1 tablet (0.5 mg total) by mouth 2 (two) times daily. Give in the morning and mid day   morphine  (ROXANOL) 20 MG/ML concentrated solution Take 0.25 mLs (5 mg total) by mouth every 2 (two) hours as needed for severe pain (pain score 7-10).   nitroGLYCERIN  (NITROSTAT ) 0.4 MG SL tablet Place 0.4 mg under the tongue every 5 (five) minutes as needed for chest pain.   triamcinolone  cream (KENALOG ) 0.1 % Apply 1 Application topically daily as needed (rash / itching).   acetaminophen  (TYLENOL ) 325 MG tablet Take 2  tablets (650 mg total) by mouth in the morning and at bedtime. (Patient not taking: Reported on 10/15/2024)   apixaban  (ELIQUIS ) 5 MG TABS tablet Take 1 tablet (5 mg total) by mouth 2 (two) times daily. (Patient not taking: Reported on 10/15/2024)   docusate sodium  (COLACE) 100 MG capsule Take 1 capsule (100 mg total) by mouth 2 (two) times daily. (Patient not taking: Reported on 10/15/2024)   escitalopram  (LEXAPRO ) 20 MG tablet Take 20 mg by mouth daily. (Patient not taking: Reported on 10/15/2024)   ezetimibe  (ZETIA ) 10 MG tablet Take 0.5 mg by mouth daily. (Patient not taking: Reported on 10/15/2024)   furosemide  (LASIX ) 20 MG tablet Take 20 mg by mouth 3 (three) times a week. In the morning on Monday, Wednesday, and Friday. (Patient not taking: Reported on 10/15/2024)   haloperidol  (HALDOL ) 1 MG tablet Take 2 mg by mouth 2 (two) times daily as needed for agitation. (Patient not taking: Reported on 10/15/2024)   hydrOXYzine  (VISTARIL ) 25 MG capsule Take 25 mg by mouth in the morning and at bedtime. (Patient not taking: Reported on 10/15/2024)   LORazepam  (ATIVAN ) 1 MG tablet Take 1 tablet (1 mg total) by mouth every evening. (Patient not taking: Reported on 10/15/2024)   magnesium  hydroxide (MILK OF MAGNESIA) 400 MG/5ML suspension Take by mouth as directed.  Give 45 cc by mouth as needed for Constipation 45 cc po daily prn except with renal failure (Patient not taking: Reported on 10/15/2024)   melatonin 5 MG TABS Take 5 mg by mouth at bedtime. (Patient not taking: Reported on 10/15/2024)   methocarbamol  (ROBAXIN ) 500 MG tablet Take 1 tablet (500 mg total) by mouth every 6 (six) hours as needed for muscle spasms. (Patient not taking: Reported on 10/15/2024)   metoprolol  tartrate (LOPRESSOR ) 25 MG tablet Take 25 mg by mouth 2 (two) times daily. Hold for SBP <100 (Patient not taking: Reported on 10/15/2024)   mirtazapine (REMERON SOL-TAB) 15 MG disintegrating tablet Take 15 mg by mouth at bedtime. (Patient not  taking: Reported on 10/15/2024)   Multiple Vitamin (MULTIVITAMIN WITH MINERALS) TABS tablet Take 1 tablet by mouth daily.   omeprazole  (PRILOSEC) 40 MG capsule Take 40 mg by mouth in the morning. Take on empty stomach 30 minutes before breakfast. (Patient not taking: Reported on 10/15/2024)   potassium chloride  (KLOR-CON ) 10 MEQ tablet Take 10 mEq by mouth 3 (three) times a week. Monday, Wednesday, and Friday. (Patient not taking: Reported on 10/15/2024)   risperiDONE  (RISPERDAL ) 1 MG  tablet Take 2 mg by mouth every evening. (Patient not taking: Reported on 10/15/2024)   risperiDONE  (RISPERDAL ) 1 MG tablet Take 1 tablet (1 mg total) by mouth every morning. (Patient not taking: Reported on 10/15/2024)   rosuvastatin  (CRESTOR ) 5 MG tablet Take 5 mg by mouth every morning. (Patient not taking: Reported on 10/15/2024)   No facility-administered encounter medications on file as of 10/15/2024.    Review of Systems  Unable to perform ROS: Dementia    Immunization History  Administered Date(s) Administered   Fluad Quad(high Dose 65+) 07/09/2019, 08/15/2020, 09/16/2021, 07/29/2022, 09/06/2023   INFLUENZA, HIGH DOSE SEASONAL PF 09/16/2015, 08/18/2016, 08/31/2017, 08/14/2018   Influenza Split 08/10/2011, 08/15/2012   Influenza Whole 08/21/2009, 09/22/2010   Influenza,inj,Quad PF,6+ Mos 08/06/2013   Influenza-Unspecified 08/31/2017, 08/14/2024   Moderna Covid-19 Vaccine  Bivalent Booster 63yrs & up 09/06/2023   PFIZER(Purple Top)SARS-COV-2 Vaccination 12/11/2019, 01/01/2020, 08/12/2020   Pfizer Covid-19 Vaccine Bivalent Booster 40yrs & up 03/11/2023   Pfizer(Comirnaty)Fall Seasonal Vaccine 12 years and older 08/14/2024   Pneumococcal Conjugate-13 10/02/2013   Pneumococcal Polysaccharide-23 06/04/2010   Tdap 10/22/2011, 09/22/2022   Zoster Recombinant(Shingrix) 03/09/2017, 05/10/2017   Pertinent  Health Maintenance Due  Topic Date Due   Influenza Vaccine  Completed      08/18/2023    4:26 PM  11/01/2023   12:07 PM 04/24/2024   10:15 AM 07/19/2024    4:39 PM 09/18/2024    3:34 PM  Fall Risk  Falls in the past year? 1 0 1 0 0  Was there an injury with Fall? 1 0 0 0 0  Fall Risk Category Calculator 3 0 1 0 0  Patient at Risk for Falls Due to History of fall(s)  History of fall(s);Impaired balance/gait;Impaired mobility History of fall(s);Impaired balance/gait;Impaired mobility History of fall(s);Impaired balance/gait;Impaired mobility  Fall risk Follow up Falls evaluation completed Falls evaluation completed Falls evaluation completed;Education provided Falls evaluation completed;Education provided Falls evaluation completed   Functional Status Survey:    Vitals:   10/15/24 0912  BP: (!) 146/72  Pulse: 76  Resp: 18  Temp: 97.7 F (36.5 C)  SpO2: 98%  Weight: 200 lb (90.7 kg)  Height: 6' (1.829 m)   Body mass index is 27.12 kg/m. Physical Exam Vitals and nursing note reviewed.  Constitutional:      Comments: Lethargic but responds.  Ill appearing.   HENT:     Head: Normocephalic and atraumatic.     Left Ear: Tympanic membrane normal.     Nose: Nose normal.     Mouth/Throat:     Mouth: Mucous membranes are moist.     Pharynx: Oropharyngeal exudate present.  Eyes:     Conjunctiva/sclera: Conjunctivae normal.     Pupils: Pupils are equal, round, and reactive to light.  Cardiovascular:     Rate and Rhythm: Normal rate and regular rhythm.     Heart sounds: No murmur heard. Pulmonary:     Effort: Pulmonary effort is normal. No respiratory distress.     Breath sounds: Rhonchi present. No wheezing.  Abdominal:     General: Bowel sounds are normal. There is distension (mild).     Palpations: Abdomen is soft.     Tenderness: There is no abdominal tenderness.  Musculoskeletal:     Cervical back: Normal range of motion. No rigidity.     Right lower leg: Edema present.     Left lower leg: Edema present.  Lymphadenopathy:     Cervical: No cervical adenopathy.  Skin:  General: Skin is warm and dry.  Neurological:     General: No focal deficit present.  Psychiatric:     Comments: calm     Labs reviewed: Recent Labs    01/02/24 0557 01/05/24 1805 01/06/24 0000 02/21/24 1452 04/02/24 0000 06/25/24 0000 08/13/24 0000 10/04/24 0000  NA 135 139   < > 138   < > 142 139 140  K 3.4* 3.6   < > 3.6   < > 3.8 3.9 3.8  CL 105 107   < > 106   < > 107 105 106  CO2 20* 24   < > 24   < > 24* 23* 25*  GLUCOSE 112* 96  --  81  --   --   --   --   BUN 21 15   < > 16   < > 14 14 25*  CREATININE 0.76 0.88   < > 0.89   < > 0.8 0.8 0.7  CALCIUM  8.4* 8.5*   < > 8.7*   < > 8.8 8.9 8.9  MG 1.9  --   --   --   --   --   --   --    < > = values in this interval not displayed.   Recent Labs    01/01/24 0004 01/02/24 0557 02/21/24 1452 04/02/24 0000 06/25/24 0000  AST 24 35 28 17 23   ALT 29 33 31 24 19   ALKPHOS 55 51 60 81 70  BILITOT 0.5 0.5 0.6  --   --   PROT 6.6 6.6 6.7  --   --   ALBUMIN  3.2* 3.0* 3.5 3.4* 3.5   Recent Labs    01/01/24 0004 01/02/24 0557 01/05/24 1805 02/21/24 1435 04/02/24 0000  WBC 6.1 4.2 4.1 7.7 6.8  NEUTROABS 5.0  --  2.0 4.4  --   HGB 11.0* 10.7* 11.7* 12.0* 11.5*  HCT 32.8* 32.2* 34.6* 36.3* 34*  MCV 91.9 92.8 90.8 93.6  --   PLT 227 196 229 283 298   Lab Results  Component Value Date   TSH 1.27 04/02/2024   Lab Results  Component Value Date   HGBA1C 6.4 08/30/2017   Lab Results  Component Value Date   CHOL 82 05/29/2024   HDL 41 05/29/2024   LDLCALC 14 05/29/2024   LDLDIRECT 168.9 05/24/2013   TRIG 130 05/29/2024   CHOLHDL 4.2 08/04/2020    Significant Diagnostic Results in last 30 days:  No results found.  Assessment/Plan  1. Acute respiratory failure with hypoxia (HCC) (Primary) Sats stable on 3 liters Ok to titrate Roxanol as needed for sob  2. Healthcare-associated pneumonia Completed Doxycycline  Remains weak with lethargy and cough No further work up due to goals of care Hospice  consult pending.   3. 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 At this time he is resting comfortably using ativan  for agitation.

## 2024-10-19 ENCOUNTER — Other Ambulatory Visit: Payer: Self-pay | Admitting: Adult Health

## 2024-10-19 DIAGNOSIS — F4323 Adjustment disorder with mixed anxiety and depressed mood: Secondary | ICD-10-CM | POA: Diagnosis not present

## 2024-10-19 DIAGNOSIS — G308 Other Alzheimer's disease: Secondary | ICD-10-CM

## 2024-10-19 MED ORDER — LORAZEPAM 0.5 MG PO TABS: 0.5000 mg | ORAL_TABLET | Freq: Two times a day (BID) | ORAL | 5 refills | Status: AC

## 2024-12-03 ENCOUNTER — Non-Acute Institutional Stay (SKILLED_NURSING_FACILITY): Payer: Self-pay | Admitting: Internal Medicine

## 2024-12-03 DIAGNOSIS — G308 Other Alzheimer's disease: Secondary | ICD-10-CM

## 2024-12-03 DIAGNOSIS — F02C3 Dementia in other diseases classified elsewhere, severe, with mood disturbance: Secondary | ICD-10-CM

## 2024-12-03 DIAGNOSIS — R601 Generalized edema: Secondary | ICD-10-CM

## 2024-12-05 ENCOUNTER — Encounter: Payer: Self-pay | Admitting: Internal Medicine

## 2024-12-05 NOTE — Addendum Note (Signed)
 Addended by: CHARLANNE FREDIA SMALLING on: 12/05/2024 03:14 PM   Modules accepted: Level of Service

## 2024-12-05 NOTE — Progress Notes (Signed)
 "  Location:  Medical Illustrator of Service:  SNF (31)  Provider:   Code Status: DNR lenor Goals of Care:     08/27/2024    2:39 PM  Advanced Directives  Does Patient Have a Medical Advance Directive? Yes  Type of Estate Agent of [place];Out of facility DNR (pink MOST or yellow form);Living will  Does patient want to make changes to medical advance directive? No - Patient declined  Copy of Healthcare Power of Attorney in Chart? Yes - validated most recent copy scanned in chart (See row information)     Chief Complaint  Patient presents with   Care Management    HPI: Patient is a 89 y.o. male seen today for medical management of chronic diseases.    Lives in SNF in [place]   Patient has h/o Dementia with behavior issues, Rheumatoid Arthritis, A Fib, HTN, CAD s/p PCI, GERD and Sleep apnea And Depression    He has Severe Dementia with Behaviors Enrolled in Hospice now Continues to have behavior issues in the evening requiring Haldol  sometimes Resists Care Is Deitra Dependent Needs Cueing with eating Was not responsive to me today as had gotten Haldol  this morning due to behaviors Wt Readings from Last 3 Encounters:  12/03/24 194 lb 9.6 oz (88.3 kg)  10/15/24 200 lb (90.7 kg)  10/02/24 200 lb (90.7 kg)      Past Medical History:  Diagnosis Date   CAD (coronary artery disease)    a. s/p inferior STEMI 01/08/2014; LHC 01/08/14: total RCA occlusion s/p 3.5x61mm Xience DES distal RCA and 3.5x28 mm DES mid RCA, 60-70% mid LAD stenosis, EF 55%.   Complication of anesthesia    'long to wake up after back surgery  09/2003   Diverticulosis of colon    GERD (gastroesophageal reflux disease)    History of cellulitis    05-26-2015  LLE   History of colon polyps    1998- benign/  2008 adenomatous    History of kidney stones    2013   History of squamous cell carcinoma excision    2013;  2015;  06-12-2015 right leg/  02/ and 05/ 2017   left ear and left leg   Hydronephrosis, left    Hyperlipidemia    Hypertension    Migraine with aura    OSA on CPAP 06/19/2015   Moderate OSA with AHI 18/hr  per study 05-20-2015   Osteoarthritis    Paroxysmal atrial fibrillation (HCC) 4/16   chads2vasc score is at least 4   Premature atrial contractions    RA (rheumatoid arthritis) [facility])    rheumatologist-  dr lynwood ramsay   Sinusitis, chronic 10/17/2015   Wears glasses    Wears hearing aid    bilateral    Past Surgical History:  Procedure Laterality Date   BACK SURGERY     CARDIOVASCULAR STRESS TEST  11/21/2015   Low risk nuclear study w/ a small diaphragmatic attenuation artifact, no ischemia/  normal LV function and wall motion , ef 63%   CATARACT EXTRACTION W/ INTRAOCULAR LENS  IMPLANT, BILATERAL  2015   COLONOSCOPY  last one 06-09-2011   CORONARY ANGIOPLASTY     CYSTOSCOPY W/ RETROGRADES Left 07/12/2016   Procedure: CYSTOSCOPY WITH RETROGRADE PYELOGRAM LEFT URETERAL STENT;  Surgeon: Norleen Seltzer, MD;  Location: [facility];  Service: Urology;  Laterality: Left;   CYSTOSCOPY WITH RETROGRADE PYELOGRAM, URETEROSCOPY AND STENT PLACEMENT Left 08/11/2016   Procedure: CYSTOSCOPY WITH RETROGRADE PYELOGRAM,  DIAGNOSTIC URETEROSCOPY , STENT EXCHANGE;  Surgeon: Ricardo Likens, MD;  Location: [facility];  Service: Urology;  Laterality: Left;   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   severe fibromatosis palm and fingers   LEFT HEART CATHETERIZATION WITH CORONARY ANGIOGRAM N/A 01/08/2014   Procedure: LEFT HEART CATHETERIZATION WITH CORONARY ANGIOGRAM;  Surgeon: Debby DELENA Sor, MD;  Location: [facility] C[facility];  Service: Cardiovascular;  Laterality:N/A;  total/ subtotal RCA/  mLAD 60-70% w/ mid systolic bridging/  preserved global LVF, ef 55%   MOHS SURGERY  x2  feb and may 2017   left ear /  left leg  ([facility])   ORIF ACETABULAR FRACTURE Right 03/29/2023   Procedure: OPEN REDUCTION INTERNAL FIXATION (ORIF) ACETABULAR FRACTURE;   Surgeon: Celena Sharper, MD;  Location: [facility] OR;  Service: Orthopedics;  Laterality: Right;   PERCUTANEOUS CORONARY STENT INTERVENTION (PCI-S)  01/08/2014   Procedure: PERCUTANEOUS CORONARY STENT INTERVENTION (PCI-S);  Surgeon: Debby DELENA Sor, MD;  Location: [facility] CATH LAB;  Service: Cardiovascular;;  DES to mid and distal RCA   POSTERIOR LUMBAR FUSION  10/01/2003   and Laminectomy/ diskectomy  L4 -- S1   ROBOT ASSISTED PYELOPLASTY Left 09/15/2016   Procedure: XI ROBOTIC ASSISTED PYELOPLASTY;  Surgeon: Ricardo Likens, MD;  Location: [facility];  Service: Urology;  Laterality: Left;   TONSILLECTOMY     TRANSTHORACIC ECHOCARDIOGRAM  02/23/2016   mild LVH,  ef 50-55%,  grade 1 diastolic dysfunction/  mild to moderate AV calcification w/ no stenosis or regurg./  trivial MR and TR/  mild PR    Allergies[1]  Outpatient Encounter Medications as of 12/03/2024  Medication Sig   LORazepam  (ATIVAN ) 1 MG tablet Take 1 tablet (1 mg total) by mouth every evening.   magnesium  hydroxide (MILK OF MAGNESIA) 400 MG/5ML suspension Take by mouth as directed.  Give 45 cc by mouth as needed for Constipation 45 cc po daily prn except with renal failure   risperiDONE  (RISPERDAL ) 3 MG tablet Take 3 mg by mouth every evening.   acetaminophen  (TYLENOL ) 325 MG tablet Take 2 tablets (650 mg total) by mouth in the morning and at bedtime. (Patient not taking: Reported on 10/15/2024)   camphor-menthol (SARNA) lotion Apply 1 Application topically 2 (two) times daily as needed for itching.   Emollient (CETAPHIL) cream Apply 1 Application topically at bedtime.   haloperidol  (HALDOL ) 1 MG tablet Take 2 mg by mouth 2 (two) times daily as needed for agitation. (Patient not taking: Reported on 10/15/2024)   LORazepam  (ATIVAN ) 0.5 MG tablet Take 1 tablet (0.5 mg total) by mouth 2 (two) times daily. Give in the morning and mid day   morphine  (ROXANOL) 20 MG/ML concentrated solution Take 0.25 mLs (5 mg total) by mouth every 2 (two) hours as  needed for severe pain (pain score 7-10).   Multiple Vitamin (MULTIVITAMIN WITH MINERALS) TABS tablet Take 1 tablet by mouth daily.   nitroGLYCERIN  (NITROSTAT ) 0.4 MG SL tablet Place 0.4 mg under the tongue every 5 (five) minutes as needed for chest pain.   omeprazole  (PRILOSEC) 40 MG capsule Take 40 mg by mouth in the morning. Take on empty stomach 30 minutes before breakfast. (Patient not taking: Reported on 10/15/2024)   triamcinolone  cream (KENALOG ) 0.1 % Apply 1 Application topically daily as needed (rash / itching).   [DISCONTINUED] apixaban  (ELIQUIS ) 5 MG TABS tablet Take 1 tablet (5 mg total) by mouth 2 (two) times daily. (Patient not taking: Reported on 10/15/2024)   [DISCONTINUED] docusate sodium  (COLACE) 100  MG capsule Take 1 capsule (100 mg total) by mouth 2 (two) times daily. (Patient not taking: Reported on 10/15/2024)   [DISCONTINUED] escitalopram  (LEXAPRO ) 20 MG tablet Take 20 mg by mouth daily. (Patient not taking: Reported on 10/15/2024)   [DISCONTINUED] ezetimibe  (ZETIA ) 10 MG tablet Take 0.5 mg by mouth daily. (Patient not taking: Reported on 10/15/2024)   [DISCONTINUED] furosemide  (LASIX ) 20 MG tablet Take 20 mg by mouth 3 (three) times a week. In the morning on Monday, Wednesday, and Friday. (Patient not taking: Reported on 10/15/2024)   [DISCONTINUED] hydrOXYzine  (VISTARIL ) 25 MG capsule Take 25 mg by mouth in the morning and at bedtime. (Patient not taking: Reported on 10/15/2024)   [DISCONTINUED] melatonin 5 MG TABS Take 5 mg by mouth at bedtime. (Patient not taking: Reported on 10/15/2024)   [DISCONTINUED] methocarbamol  (ROBAXIN ) 500 MG tablet Take 1 tablet (500 mg total) by mouth every 6 (six) hours as needed for muscle spasms. (Patient not taking: Reported on 10/15/2024)   [DISCONTINUED] metoprolol  tartrate (LOPRESSOR ) 25 MG tablet Take 25 mg by mouth 2 (two) times daily. Hold for SBP <100 (Patient not taking: Reported on 10/15/2024)   [DISCONTINUED] mirtazapine (REMERON SOL-TAB) 15  MG disintegrating tablet Take 15 mg by mouth at bedtime. (Patient not taking: Reported on 10/15/2024)   [DISCONTINUED] potassium chloride  (KLOR-CON ) 10 MEQ tablet Take 10 mEq by mouth 3 (three) times a week. Monday, Wednesday, and Friday. (Patient not taking: Reported on 10/15/2024)   [DISCONTINUED] risperiDONE  (RISPERDAL ) 1 MG tablet Take 1 tablet (1 mg total) by mouth every morning. (Patient not taking: Reported on 10/15/2024)   [DISCONTINUED] rosuvastatin  (CRESTOR ) 5 MG tablet Take 5 mg by mouth every morning. (Patient not taking: Reported on 10/15/2024)   No facility-administered encounter medications on file as of 12/03/2024.    Review of Systems:  Review of Systems  Unable to perform ROS: Dementia    Health Maintenance  Topic Date Due   COVID-19 Vaccine (7 - Pfizer risk 2025-26 season) 02/11/2025   Medicare Annual Wellness (AWV)  04/24/2025   DTaP/Tdap/Td (3 - Td or Tdap) 09/22/2032   Pneumococcal Vaccine: 50+ Years  Completed   Influenza Vaccine  Completed   Zoster Vaccines- Shingrix  Completed   Meningococcal B Vaccine  Aged Out    Physical Exam: Vitals:   12/03/24 1440  BP: 130/83  Pulse: 84  Resp: 18  Temp: (!) 97.2 F (36.2 C)  Weight: 194 lb 9.6 oz (88.3 kg)   Body mass index is 26.39 kg/m. Physical Exam Vitals reviewed.  Constitutional:      Appearance: Normal appearance.  HENT:     Head: Normocephalic.     Nose: Nose normal.     Mouth/Throat:     Mouth: Mucous membranes are dry.     Pharynx: Oropharynx is clear.  Eyes:     Pupils: Pupils are equal, round, and reactive to light.  Cardiovascular:     Rate and Rhythm: Normal rate and regular rhythm.     Pulses: Normal pulses.     Heart sounds: No murmur heard. Pulmonary:     Effort: Pulmonary effort is normal. No respiratory distress.     Breath sounds: Normal breath sounds. No rales.  Abdominal:     General: Abdomen is flat. Bowel sounds are normal.     Palpations: Abdomen is soft.  Musculoskeletal:         General: Swelling present.     Cervical back: Neck supple.  Skin:    General: Skin is  warm.  Psychiatric:        Mood and Affect: Mood normal.        Thought Content: Thought content normal.     Labs reviewed: Basic Metabolic Panel: Recent Labs    01/02/24 0557 01/05/24 1805 01/06/24 0000 02/21/24 1452 04/02/24 0000 06/25/24 0000 08/13/24 0000 10/04/24 0000  NA 135 139   < > 138 139 142 139 140  K 3.4* 3.6   < > 3.6 4.1 3.8 3.9 3.8  CL 105 107   < > 106 106 107 105 106  CO2 20* 24   < > 24 21 24* 23* 25*  GLUCOSE 112* 96  --  81  --   --   --   --   BUN 21 15   < > 16 19 14 14  25*  CREATININE 0.76 0.88   < > 0.89 0.8 0.8 0.8 0.7  CALCIUM  8.4* 8.5*   < > 8.7* 8.5* 8.8 8.9 8.9  MG 1.9  --   --   --   --   --   --   --   TSH  --   --   --   --  1.27  --   --   --    < > = values in this interval not displayed.   Liver Function Tests: Recent Labs    01/01/24 0004 01/02/24 0557 02/21/24 1452 04/02/24 0000 06/25/24 0000  AST 24 35 28 17 23   ALT 29 33 31 24 19   ALKPHOS 55 51 60 81 70  BILITOT 0.5 0.5 0.6  --   --   PROT 6.6 6.6 6.7  --   --   ALBUMIN  3.2* 3.0* 3.5 3.4* 3.5   No results for input(s): LIPASE, AMYLASE in the last 8760 hours. No results for input(s): AMMONIA in the last 8760 hours. CBC: Recent Labs    01/01/24 0004 01/02/24 0557 01/05/24 1805 02/21/24 1435 04/02/24 0000  WBC 6.1 4.2 4.1 7.7 6.8  NEUTROABS 5.0  --  2.0 4.4  --   HGB 11.0* 10.7* 11.7* 12.0* 11.5*  HCT 32.8* 32.2* 34.6* 36.3* 34*  MCV 91.9 92.8 90.8 93.6  --   PLT 227 196 229 283 298   Lipid Panel: Recent Labs    05/29/24 0000  CHOL 82  HDL 41  LDLCALC 14  TRIG 130   Lab Results  Component Value Date   HGBA1C 6.4 08/30/2017    Procedures since last visit: No results found.  Assessment/Plan Patient with End stage  Alzheimer Dementia Behaviors CAD , A Fib Now off all meds Admitted in Hospice Unfortunately continues to need high  doses of risperidone    As needed Haldol  for behaviors specially when they are trying to care. Will continue to make sure he is comfortable   Labs/tests ordered:  * No order type specified * Next appt:  Visit date not found        [1]  Allergies Allergen Reactions   Methotrexate Other (See Comments)    REACTION: tachycardia   Aricept  [Donepezil  Hcl] Other (See Comments)    Nightmares. Pt takes this medication per MAR    Crestor  [Rosuvastatin  Calcium ] Other (See Comments)    Myalgias with 20mg  dose   Lisinopril  Other (See Comments)    cough   Namenda  [Memantine ] Diarrhea and Nausea Only   Atorvastatin  Other (See Comments)    Muscle pain, leg cramps   Depakote  [Divalproex  Sodium] Rash    Allergy not listed  on MAR    Pravastatin Sodium Other (See Comments)    REACTION: cramps, fatigue   "
